# Patient Record
Sex: Female | Born: 1940 | Race: Black or African American | Hispanic: No | Marital: Single | Smoking: Former smoker
Health system: Southern US, Community
[De-identification: ages and names within clinical notes are randomized; demographics above are authoritative.]

## PROBLEM LIST (undated history)

## (undated) DIAGNOSIS — H9190 Unspecified hearing loss, unspecified ear: Secondary | ICD-10-CM

## (undated) DIAGNOSIS — J8289 Other pulmonary eosinophilia, not elsewhere classified: Secondary | ICD-10-CM

## (undated) DIAGNOSIS — M549 Dorsalgia, unspecified: Secondary | ICD-10-CM

## (undated) DIAGNOSIS — I10 Essential (primary) hypertension: Secondary | ICD-10-CM

## (undated) DIAGNOSIS — E249 Cushing's syndrome, unspecified: Secondary | ICD-10-CM

## (undated) DIAGNOSIS — J82 Pulmonary eosinophilia, not elsewhere classified: Secondary | ICD-10-CM

## (undated) DIAGNOSIS — J449 Chronic obstructive pulmonary disease, unspecified: Secondary | ICD-10-CM

## (undated) DIAGNOSIS — K219 Gastro-esophageal reflux disease without esophagitis: Secondary | ICD-10-CM

## (undated) DIAGNOSIS — M1990 Unspecified osteoarthritis, unspecified site: Secondary | ICD-10-CM

## (undated) DIAGNOSIS — N183 Chronic kidney disease, stage 3 unspecified: Secondary | ICD-10-CM

## (undated) DIAGNOSIS — D369 Benign neoplasm, unspecified site: Secondary | ICD-10-CM

## (undated) DIAGNOSIS — F419 Anxiety disorder, unspecified: Secondary | ICD-10-CM

## (undated) DIAGNOSIS — K5792 Diverticulitis of intestine, part unspecified, without perforation or abscess without bleeding: Secondary | ICD-10-CM

## (undated) DIAGNOSIS — Z974 Presence of external hearing-aid: Secondary | ICD-10-CM

## (undated) DIAGNOSIS — D509 Iron deficiency anemia, unspecified: Secondary | ICD-10-CM

## (undated) DIAGNOSIS — M4800 Spinal stenosis, site unspecified: Secondary | ICD-10-CM

## (undated) DIAGNOSIS — S22000A Wedge compression fracture of unspecified thoracic vertebra, initial encounter for closed fracture: Secondary | ICD-10-CM

## (undated) DIAGNOSIS — Z972 Presence of dental prosthetic device (complete) (partial): Secondary | ICD-10-CM

## (undated) DIAGNOSIS — M539 Dorsopathy, unspecified: Secondary | ICD-10-CM

## (undated) DIAGNOSIS — G8929 Other chronic pain: Secondary | ICD-10-CM

## (undated) DIAGNOSIS — M1380 Other specified arthritis, unspecified site: Secondary | ICD-10-CM

## (undated) HISTORY — DX: Other specified arthritis, unspecified site: M13.80

## (undated) HISTORY — DX: Chronic kidney disease, stage 3 unspecified: N18.30

## (undated) HISTORY — PX: ABDOMINAL HYSTERECTOMY: SHX81

## (undated) HISTORY — DX: Iron deficiency anemia, unspecified: D50.9

## (undated) HISTORY — DX: Cushing's syndrome, unspecified: E24.9

## (undated) HISTORY — DX: Unspecified osteoarthritis, unspecified site: M19.90

## (undated) HISTORY — DX: Other pulmonary eosinophilia, not elsewhere classified: J82.89

## (undated) HISTORY — PX: TUBAL LIGATION: SHX77

## (undated) HISTORY — PX: EYE SURGERY: SHX253

## (undated) HISTORY — DX: Chronic kidney disease, stage 3 (moderate): N18.3

## (undated) HISTORY — PX: BACK SURGERY: SHX140

## (undated) HISTORY — DX: Essential (primary) hypertension: I10

## (undated) HISTORY — DX: Spinal stenosis, site unspecified: M48.00

## (undated) HISTORY — PX: KNEE ARTHROSCOPY: SUR90

## (undated) HISTORY — DX: Pulmonary eosinophilia, not elsewhere classified: J82

## (undated) HISTORY — DX: Benign neoplasm, unspecified site: D36.9

## (undated) HISTORY — PX: JOINT REPLACEMENT: SHX530

---

## 1989-03-15 DIAGNOSIS — J45909 Unspecified asthma, uncomplicated: Secondary | ICD-10-CM

## 1989-03-15 HISTORY — DX: Unspecified asthma, uncomplicated: J45.909

## 2001-07-26 ENCOUNTER — Encounter: Payer: Self-pay | Admitting: Emergency Medicine

## 2001-07-26 ENCOUNTER — Emergency Department (HOSPITAL_COMMUNITY): Admission: EM | Admit: 2001-07-26 | Discharge: 2001-07-26 | Payer: Self-pay | Admitting: Emergency Medicine

## 2001-10-26 ENCOUNTER — Ambulatory Visit (HOSPITAL_COMMUNITY): Admission: RE | Admit: 2001-10-26 | Discharge: 2001-10-26 | Payer: Self-pay | Admitting: Unknown Physician Specialty

## 2001-10-26 ENCOUNTER — Encounter: Payer: Self-pay | Admitting: Unknown Physician Specialty

## 2002-10-30 ENCOUNTER — Ambulatory Visit (HOSPITAL_COMMUNITY): Admission: RE | Admit: 2002-10-30 | Discharge: 2002-10-30 | Payer: Self-pay | Admitting: Pulmonary Disease

## 2003-02-01 ENCOUNTER — Ambulatory Visit (HOSPITAL_COMMUNITY): Admission: RE | Admit: 2003-02-01 | Discharge: 2003-02-01 | Payer: Self-pay | Admitting: Family Medicine

## 2003-02-25 ENCOUNTER — Ambulatory Visit (HOSPITAL_COMMUNITY): Admission: RE | Admit: 2003-02-25 | Discharge: 2003-02-25 | Payer: Self-pay | Admitting: *Deleted

## 2003-05-26 ENCOUNTER — Emergency Department (HOSPITAL_COMMUNITY): Admission: EM | Admit: 2003-05-26 | Discharge: 2003-05-26 | Payer: Self-pay | Admitting: Emergency Medicine

## 2003-06-13 ENCOUNTER — Ambulatory Visit (HOSPITAL_COMMUNITY): Admission: RE | Admit: 2003-06-13 | Discharge: 2003-06-13 | Payer: Self-pay | Admitting: Family Medicine

## 2003-07-02 ENCOUNTER — Emergency Department (HOSPITAL_COMMUNITY): Admission: EM | Admit: 2003-07-02 | Discharge: 2003-07-02 | Payer: Self-pay | Admitting: Emergency Medicine

## 2003-07-02 ENCOUNTER — Inpatient Hospital Stay (HOSPITAL_COMMUNITY): Admission: EM | Admit: 2003-07-02 | Discharge: 2003-07-05 | Payer: Self-pay | Admitting: Emergency Medicine

## 2003-07-31 ENCOUNTER — Ambulatory Visit (HOSPITAL_COMMUNITY): Admission: RE | Admit: 2003-07-31 | Discharge: 2003-07-31 | Payer: Self-pay | Admitting: Family Medicine

## 2003-09-02 ENCOUNTER — Ambulatory Visit (HOSPITAL_COMMUNITY): Admission: RE | Admit: 2003-09-02 | Discharge: 2003-09-02 | Payer: Self-pay | Admitting: Internal Medicine

## 2003-09-24 ENCOUNTER — Emergency Department (HOSPITAL_COMMUNITY): Admission: EM | Admit: 2003-09-24 | Discharge: 2003-09-25 | Payer: Self-pay | Admitting: Emergency Medicine

## 2003-10-31 ENCOUNTER — Ambulatory Visit (HOSPITAL_COMMUNITY): Admission: RE | Admit: 2003-10-31 | Discharge: 2003-10-31 | Payer: Self-pay | Admitting: Unknown Physician Specialty

## 2004-11-02 ENCOUNTER — Ambulatory Visit (HOSPITAL_COMMUNITY): Admission: RE | Admit: 2004-11-02 | Discharge: 2004-11-02 | Payer: Self-pay | Admitting: Unknown Physician Specialty

## 2005-01-01 ENCOUNTER — Ambulatory Visit (HOSPITAL_COMMUNITY): Admission: RE | Admit: 2005-01-01 | Discharge: 2005-01-01 | Payer: Self-pay | Admitting: Family Medicine

## 2005-01-18 ENCOUNTER — Encounter (HOSPITAL_COMMUNITY): Admission: RE | Admit: 2005-01-18 | Discharge: 2005-02-17 | Payer: Self-pay | Admitting: Oncology

## 2005-01-18 ENCOUNTER — Ambulatory Visit (HOSPITAL_COMMUNITY): Payer: Self-pay | Admitting: Oncology

## 2005-01-18 ENCOUNTER — Encounter: Admission: RE | Admit: 2005-01-18 | Discharge: 2005-01-18 | Payer: Self-pay | Admitting: Oncology

## 2005-08-02 ENCOUNTER — Ambulatory Visit (HOSPITAL_COMMUNITY): Payer: Self-pay | Admitting: Oncology

## 2005-08-02 ENCOUNTER — Encounter: Admission: RE | Admit: 2005-08-02 | Discharge: 2005-08-02 | Payer: Self-pay | Admitting: Oncology

## 2005-08-02 ENCOUNTER — Encounter (HOSPITAL_COMMUNITY): Admission: RE | Admit: 2005-08-02 | Discharge: 2005-09-01 | Payer: Self-pay | Admitting: Oncology

## 2005-12-03 ENCOUNTER — Ambulatory Visit (HOSPITAL_COMMUNITY): Admission: RE | Admit: 2005-12-03 | Discharge: 2005-12-03 | Payer: Self-pay | Admitting: Family Medicine

## 2005-12-09 ENCOUNTER — Ambulatory Visit: Payer: Self-pay | Admitting: Orthopedic Surgery

## 2006-01-28 ENCOUNTER — Encounter (HOSPITAL_COMMUNITY): Admission: RE | Admit: 2006-01-28 | Discharge: 2006-02-27 | Payer: Self-pay | Admitting: Oncology

## 2006-01-28 ENCOUNTER — Encounter: Admission: RE | Admit: 2006-01-28 | Discharge: 2006-01-28 | Payer: Self-pay | Admitting: Oncology

## 2006-01-28 ENCOUNTER — Ambulatory Visit (HOSPITAL_COMMUNITY): Payer: Self-pay | Admitting: Oncology

## 2006-03-21 ENCOUNTER — Encounter (HOSPITAL_COMMUNITY): Admission: RE | Admit: 2006-03-21 | Discharge: 2006-04-20 | Payer: Self-pay | Admitting: Preventative Medicine

## 2006-08-01 ENCOUNTER — Encounter (HOSPITAL_COMMUNITY): Admission: RE | Admit: 2006-08-01 | Discharge: 2006-08-31 | Payer: Self-pay | Admitting: Oncology

## 2006-08-01 ENCOUNTER — Ambulatory Visit (HOSPITAL_COMMUNITY): Payer: Self-pay | Admitting: Oncology

## 2006-12-06 ENCOUNTER — Ambulatory Visit (HOSPITAL_COMMUNITY): Admission: RE | Admit: 2006-12-06 | Discharge: 2006-12-06 | Payer: Self-pay | Admitting: Family Medicine

## 2007-01-26 ENCOUNTER — Encounter (HOSPITAL_COMMUNITY): Admission: RE | Admit: 2007-01-26 | Discharge: 2007-02-25 | Payer: Self-pay | Admitting: Oncology

## 2007-01-26 ENCOUNTER — Ambulatory Visit (HOSPITAL_COMMUNITY): Payer: Self-pay | Admitting: Oncology

## 2007-02-01 ENCOUNTER — Ambulatory Visit (HOSPITAL_COMMUNITY): Admission: RE | Admit: 2007-02-01 | Discharge: 2007-02-01 | Payer: Self-pay | Admitting: Family Medicine

## 2007-09-26 ENCOUNTER — Ambulatory Visit (HOSPITAL_COMMUNITY): Admission: RE | Admit: 2007-09-26 | Discharge: 2007-09-26 | Payer: Self-pay | Admitting: Family Medicine

## 2007-09-27 ENCOUNTER — Ambulatory Visit (HOSPITAL_COMMUNITY): Admission: RE | Admit: 2007-09-27 | Discharge: 2007-09-27 | Payer: Self-pay | Admitting: Family Medicine

## 2007-10-30 ENCOUNTER — Ambulatory Visit (HOSPITAL_COMMUNITY): Admission: RE | Admit: 2007-10-30 | Discharge: 2007-10-30 | Payer: Self-pay | Admitting: Family Medicine

## 2007-11-03 ENCOUNTER — Ambulatory Visit (HOSPITAL_COMMUNITY): Admission: RE | Admit: 2007-11-03 | Discharge: 2007-11-03 | Payer: Self-pay | Admitting: Family Medicine

## 2007-11-28 ENCOUNTER — Emergency Department (HOSPITAL_COMMUNITY): Admission: EM | Admit: 2007-11-28 | Discharge: 2007-11-28 | Payer: Self-pay | Admitting: Emergency Medicine

## 2007-12-14 HISTORY — PX: ESOPHAGOGASTRODUODENOSCOPY: SHX1529

## 2007-12-14 HISTORY — PX: COLONOSCOPY: SHX174

## 2007-12-18 ENCOUNTER — Ambulatory Visit: Payer: Self-pay | Admitting: Internal Medicine

## 2008-01-08 ENCOUNTER — Ambulatory Visit (HOSPITAL_COMMUNITY): Admission: RE | Admit: 2008-01-08 | Discharge: 2008-01-08 | Payer: Self-pay | Admitting: Internal Medicine

## 2008-01-08 ENCOUNTER — Encounter: Payer: Self-pay | Admitting: Internal Medicine

## 2008-01-08 ENCOUNTER — Ambulatory Visit: Payer: Self-pay | Admitting: Internal Medicine

## 2008-01-19 ENCOUNTER — Ambulatory Visit (HOSPITAL_COMMUNITY): Admission: RE | Admit: 2008-01-19 | Discharge: 2008-01-19 | Payer: Self-pay | Admitting: Internal Medicine

## 2008-03-28 ENCOUNTER — Ambulatory Visit (HOSPITAL_COMMUNITY): Admission: RE | Admit: 2008-03-28 | Discharge: 2008-03-28 | Payer: Self-pay | Admitting: Family Medicine

## 2008-05-31 ENCOUNTER — Ambulatory Visit: Payer: Self-pay | Admitting: Internal Medicine

## 2008-05-31 DIAGNOSIS — K633 Ulcer of intestine: Secondary | ICD-10-CM

## 2008-06-03 ENCOUNTER — Encounter: Payer: Self-pay | Admitting: Internal Medicine

## 2008-06-19 ENCOUNTER — Ambulatory Visit: Payer: Self-pay | Admitting: Internal Medicine

## 2008-06-19 ENCOUNTER — Ambulatory Visit (HOSPITAL_COMMUNITY): Admission: RE | Admit: 2008-06-19 | Discharge: 2008-06-19 | Payer: Self-pay | Admitting: Internal Medicine

## 2008-06-19 HISTORY — PX: COLONOSCOPY: SHX174

## 2008-09-05 ENCOUNTER — Emergency Department (HOSPITAL_COMMUNITY): Admission: EM | Admit: 2008-09-05 | Discharge: 2008-09-05 | Payer: Self-pay | Admitting: Emergency Medicine

## 2008-09-30 ENCOUNTER — Ambulatory Visit (HOSPITAL_COMMUNITY): Admission: RE | Admit: 2008-09-30 | Discharge: 2008-09-30 | Payer: Self-pay | Admitting: Family Medicine

## 2009-01-15 ENCOUNTER — Ambulatory Visit (HOSPITAL_COMMUNITY): Admission: RE | Admit: 2009-01-15 | Discharge: 2009-01-15 | Payer: Self-pay | Admitting: Orthopedic Surgery

## 2009-03-20 ENCOUNTER — Ambulatory Visit (HOSPITAL_BASED_OUTPATIENT_CLINIC_OR_DEPARTMENT_OTHER): Admission: RE | Admit: 2009-03-20 | Discharge: 2009-03-20 | Payer: Self-pay | Admitting: Orthopedic Surgery

## 2009-04-07 ENCOUNTER — Ambulatory Visit (HOSPITAL_COMMUNITY): Admission: RE | Admit: 2009-04-07 | Discharge: 2009-04-07 | Payer: Self-pay | Admitting: Family Medicine

## 2009-04-21 ENCOUNTER — Ambulatory Visit (HOSPITAL_COMMUNITY): Admission: RE | Admit: 2009-04-21 | Discharge: 2009-04-21 | Payer: Self-pay | Admitting: Family Medicine

## 2009-10-09 ENCOUNTER — Ambulatory Visit (HOSPITAL_COMMUNITY): Admission: RE | Admit: 2009-10-09 | Discharge: 2009-10-09 | Payer: Self-pay | Admitting: Family Medicine

## 2010-04-05 ENCOUNTER — Encounter: Payer: Self-pay | Admitting: Family Medicine

## 2010-04-06 ENCOUNTER — Encounter: Payer: Self-pay | Admitting: Family Medicine

## 2010-05-31 LAB — POCT I-STAT 4, (NA,K, GLUC, HGB,HCT)
Glucose, Bld: 85 mg/dL (ref 70–99)
HCT: 40 % (ref 36.0–46.0)
Hemoglobin: 13.6 g/dL (ref 12.0–15.0)

## 2010-07-20 ENCOUNTER — Other Ambulatory Visit (HOSPITAL_COMMUNITY): Payer: Self-pay | Admitting: Family Medicine

## 2010-07-20 ENCOUNTER — Encounter (HOSPITAL_COMMUNITY): Payer: Self-pay

## 2010-07-20 ENCOUNTER — Ambulatory Visit (HOSPITAL_COMMUNITY)
Admission: RE | Admit: 2010-07-20 | Discharge: 2010-07-20 | Disposition: A | Discharge status: Home / Self Care | Payer: Medicare Other | Source: Ambulatory Visit | Attending: Family Medicine | Admitting: Family Medicine

## 2010-07-20 DIAGNOSIS — J441 Chronic obstructive pulmonary disease with (acute) exacerbation: Secondary | ICD-10-CM

## 2010-07-20 DIAGNOSIS — R0602 Shortness of breath: Secondary | ICD-10-CM | POA: Insufficient documentation

## 2010-07-20 DIAGNOSIS — J42 Unspecified chronic bronchitis: Secondary | ICD-10-CM

## 2010-07-20 HISTORY — DX: Chronic obstructive pulmonary disease, unspecified: J44.9

## 2010-07-28 NOTE — Consult Note (Signed)
NAME:  Glenda Terry, Glenda Terry           ACCOUNT NO.:  [id number]   MEDICAL RECORD NO.:  [id number]          PATIENT TYPE:  AMB   LOCATION:  DAY                           FACILITY:  [facility]   PHYSICIAN:  R. Roetta Sessions, M.D. DATE OF BIRTH:  Jul 21, 1940   DATE OF CONSULTATION:  DATE OF DISCHARGE:                                 CONSULTATION   REQUESTING PHYSICIAN:  Patrica Duel, MD   HEMATOLOGIST:  Ladona Horns. Neijstrom, MD   REASON FOR CONSULTATION:  Upper abdominal pain and dysphagia.   HISTORY OF PRESENT ILLNESS:  Glenda Terry is a 70 year old African  American female.  She developed upper abdominal pain around 3 weeks ago.  She presented to [facility] Emergency Room.  She had a CT of  abdomen and pelvis which was benign.  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  She was found to have positive H. pylori serology.  She was  started on a Prevpac which she has 3 more doses.  She has also been  started on nystatin and fluconazole for oral and vaginal Candida.  She  continues to have right upper quadrant abdominal pain and upper  abdominal pain.  She feels as though her food is getting stuck going  down and points to her epigastrium.  She is having problems with  dysphagia only with solid foods.  She denies any problems with liquids.  She is having frequent water brash.  It does awaken her at night from  sleeping.  She has not recently been on PPI.  She denies any NSAID use.  She occasionally has regurgitation.  For the last 2 months, she has had  problems with heartburn and indigestion, nausea.  She has vomited  multiple times.  She has been taking some Phenergan which does seem to  help some.  She denies any diarrhea or constipation.  Denies any rectal  bleeding or melena.   She does have history of chronic anemia, has been seen by Dr. Mariel Sleet  previously.  On ER record, she is found to have a hemoglobin of 11.5,  hematocrit 35.5, MCV 79.5, normal platelet and white blood cell  count, a  creatinine of 1.47 with a history of chronic renal insufficiency, a  sodium of 134, a glucose of 103 and otherwise normal CMP.  She had a  normal amylase and lipase outpatient.   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 Jerre Simon and had a  recent negative urinalysis.  She has anemia of chronic disease followed  by Dr. Mariel Sleet.  She had a complete hysterectomy in 1985.  She had a  high-risk screening colonoscopy on September 02, 2003 which was normal.   CURRENT MEDICATIONS:  1. Spiriva once daily.  2. Provera 2 puffs q.6 h.  3. Nystatin 5 mL q.i.d.  4. Prevpac 3 doses left.  5. Promethazine 25 mg q.i.d.  6. Fluconazole 100 mg daily.   ALLERGIES:  FLEXERIL and  TREE NUT.   FAMILY HISTORY:  Positive for  multiple family members with colon cancer.  She has 2 sisters who have passed away with colon cancer one at 38, the  other at 1.  She has also had a maternal grandfather, maternal aunt  immature and several maternal uncles with colon cancer.  Her mother  passed away from liver disease at 83.  Father at 31 from prostate  carcinoma and renal failure.  She has 5 living sisters with history of  polyps and 6 brothers.   SOCIAL HISTORY:  Glenda Terry is single.  She has 3 grown healthy  children.  She is employed part-time with [employer].  She has a  43-year pack history of tobacco use.  Denies any alcohol or drug use.   REVIEW OF SYSTEMS:  See HPI otherwise negative.   PHYSICAL EXAMINATION:  VITAL SIGNS:  Weight 199 pounds, height 60  inches, temperature 98 degrees, blood pressure 128/80 and pulse 56.  GENERAL:  Glenda Terry is a pleasant, alert, oriented, and cooperative  [place] female, in no acute distress.  HEENT.  Sclerae clear, nonicteric, conjunctivae pink.  Oropharynx pink  and moist without any  lesions.  She does have upper dentures intact and  partial on lower.  NECK:  Supple without any mass or thyromegaly.  HEART:  Regular rate and rhythm.  Normal S1 and S2.  No murmurs, clicks,  rubs or gallops.  LUNGS:  Clear to auscultation bilaterally.  ABDOMEN:  Positive bowel sounds x4.  No bruits auscultated.  Soft and  nondistended.  She does have mild tenderness to the right upper quadrant  and epigastrium on deep palpation.  There is no rebound, tenderness, or  guarding.  No hepatosplenomegaly or mass.  No CVAT.  EXTREMITIES:  Without clubbing or edema bilaterally.  SKIN:  Pink, warm and dry without any rash or jaundice.   IMPRESSION:  Glenda Terry is a 70 year old African American female  with approximate 78-month history of upper abdominal pain that is in the  right upper quadrant and epigastrium along with nausea and vomiting as  well as water brash, heartburn and indigestion.  I suspect much of her  symptoms could be due to H. pylori gastritis and/or peptic ulcer  disease.  She is going to require further evaluation.  Biliary etiology  should remain in the differential, although there was no evidence of  cholelithiasis on CT.  She does have a stable hepatic hemangioma.  She  also has anemia of chronic disease, suspect did due to her renal  insufficiency and a significant family history of colon carcinoma.   PLAN:  1. Check anemia panel.  2. EGD and we would suggest colonoscopy if she shows evidence of iron      deficiency and even given the fact that her strong family history.      She would be due for high-risk screening colonoscopy this coming      year anyway.  I have discussed both procedures including risks and      benefits, which include but are not limited to bleeding, infection,      perforation, or drug reaction.  She agrees with this plan and      consent will be obtained.  3. Omeprazole 20 mg daily to begin when she completes Prevpac #31 with      3 refills.   4. Yogurt with each meal.  5. She is to complete Prevpac.   Thank you Dr. Nobie Putnam for allow [place] to participate in the care of Ms.  Terry.      Lorenza Burton, N.P.      Jonathon Bellows, M.D.  Electronically Signed    KJ/MEDQ  D:  12/18/2007  T:  12/19/2007  Job:  [id number]   cc:   Patrica Duel, M.D.  Fax: [phone]

## 2010-07-28 NOTE — Op Note (Signed)
NAME:  Glenda Terry, Glenda Terry           ACCOUNT NO.:  [id number]   MEDICAL RECORD NO.:  [id number]          PATIENT TYPE:  AMB   LOCATION:  DAY                           FACILITY:  [facility]   PHYSICIAN:  R. Roetta Sessions, M.D. DATE OF BIRTH:  07-07-1940   DATE OF PROCEDURE:  06/19/2008  DATE OF DISCHARGE:                               OPERATIVE REPORT   PROCEDURE:  Diagnostic colonoscopy.   INDICATIONS FOR PROCEDURE:  A 70 year old lady with a prior history of  NSAID use, positive family history of colon cancer with  multiple first-  degree relatives.  Found to have hepatic flexure ulcerations on  colonoscopy last fall, biopsies negative for malignancy.  She is  refrained from taking any nonsteroidals since that time at my request.  She is devoid of any lower GI tract symptoms.  She now comes for a  follow-up colonoscopy to reassess these lesions.  This approach has been  discussed with the patient previously in the office and again today at  the bedside.  The risks, benefits, alternatives and limitations have  been discussed.  Please see the documentation in the medical record.   PROCEDURE NOTE:  The O2 saturation, blood pressure, pulse and  respirations were monitored throughout the entire procedure.  Conscious  sedation was with Versed 5 mg IV and Demerol 100 mg IV in divided doses.  Instrument used was Pentax video chip system.   The digital rectal exam revealed no abnormalities.  The prep was  adequate.   COLON:  Colonic mucosa was surveyed from the rectosigmoid junction  through the left transverse right colon, to the appendiceal orifice,  ileocecal valve and cecum.  These structures were well seen and  photographed for the record.  From this level the scope was slowly and  cautiously withdrawn.  All previously mentioned mucosal surfaces were  again seen.  The colon was somewhat tortuous and elongated.  There was  some scattered left-sided diverticula.  Careful inspection of the  colonic mucosa, particularly near the hepatic flexure, failed to  demonstrate any mucosal abnormality otherwise.  The scope was pulled  down into the rectum.  The rectal mucosa including retroflexed view of  the anal verge demonstrated no abnormalities.  The patient tolerated the  procedure well.  Cecal withdrawal time 7 minutes.   IMPRESSION:  1. Normal rectum.  2. Somewhat tortuous and elongated colon with scattered left-sided      diverticula.  The remainder of the colonic mucosa appeared entirely      normal.  3. Apparent healing of the previously noted colonic ulcers (I suspect      these lesions were NSAID-related).   RECOMMENDATIONS:  1. Will avoid NSAID agents in the future.  2. Given family history of colon cancer, recommend repeat high-risk      screening colonoscopy in 5 years.      Jonathon Bellows, M.D.  Electronically Signed     RMR/MEDQ  D:  06/19/2008  T:  06/19/2008  Job:  [id number]   cc:   Ladona Horns. Mariel Sleet, MD  Fax: [phone]   Patrica Duel, M.D.  Fax: [phone]

## 2010-07-28 NOTE — Op Note (Signed)
NAME:  Glenda Terry, Glenda Terry           ACCOUNT NO.:  [id number]   MEDICAL RECORD NO.:  [id number]          PATIENT TYPE:  AMB   LOCATION:  DAY                           FACILITY:  [facility]   PHYSICIAN:  R. Roetta Sessions, M.D. DATE OF BIRTH:  15-Jan-1941   DATE OF PROCEDURE:  01/08/2008  DATE OF DISCHARGE:                               OPERATIVE REPORT   INDICATIONS FOR PROCEDURE:  A 70 year old lady with 76-month history of  vague upper abdominal pain, right upper quadrant abdominal pain, nausea  and vomiting, history of gastroesophageal reflux disease recently off  acid suppression therapy.  H. pylori serologies were positive, and she  just finished a course of Prevpac.  We saw her in the office, started  her back on omeprazole 20 mg orally daily on December 18, 2007, and  already she has had marked improvement in her reflux symptoms and upper  abdominal discomfort.  She also has an anemia and marked positive family  history of colon cancer.  She had a normal colonoscopy in June 2005.  She also has element of esophageal dysphagia to solids.  EGD with  possible esophageal dilation followed by colonoscopy now being done.  Risks, benefits, and alternatives have been reviewed, questions  answered.  She is agreeable.  Please see the documentation in the  medical record.   PROCEDURE NOTE:  O2 saturation monitored throughout the entire  procedure.   CONSCIOUS SEDATION:  Versed 60 mg IV, Demerol 100 mg IV in divided  doses.   INSTRUMENTATION:  Pentax video chip system.   FINDINGS:  EGD examination, tubular esophagus revealed a noncritical  Schatzki ring.  Esophageal mucosa otherwise appeared unremarkable.  Tubular esophagus patent through the EG junction.  Scope was easily  advanced across the EG junction.  Stomach:  Gastric cavity was emptied  and insufflated well with air.  Thorough examination of the gastric  mucosa including retroflexed view of the proximal stomach  esophagogastric junction  demonstrated only a small hiatal hernia.  Pylorus patent, easily traversed.  Examination of the bulb and second  portion was slightly pale.  Duodenal mucosa otherwise appeared entirely  normal.   THERAPEUTIC/DIAGNOSTIC MANEUVERS PERFORMED:  A 56-French Maloney  dilators passed full insertion with ease.  A look back revealed a slight  rent through the EVS mucosa.  Otherwise, no apparent complication  related to passage of the dilator.  Subsequent biopsies of D2, D3 were  taken for histologic study.  The patient tolerated the procedure well  and was prepared for colonoscopy.  Digital rectal exam revealed no  abnormalities.  Endoscopic Findings:  Prep was adequate.  Colon:  Colonic mucosa was surveyed from the rectosigmoid junction through the  left transverse, right colon, the appendiceal orifice, ileocecal valve,  and cecum.  These structures were well seen and photographed for the  record.  From this level, scope was slowly withdrawn, and all previous  mentioned mucosal surfaces were again seen at the hepatic flexure.  There were 2 areas of ulcerated mucosa, they were both on folds.  Surrounding mucosa was erythematous and somewhat irregular and a little  hard to biopsy forceps  palpation, one area that was approximately 1-2 cm  in dimensions, the other was approximately 1 x 1 cm.  These were both on  folds.  The intervening mucosa appeared entirely normal.  These areas  did not appear to be obviously neoplastic, although they are certainly  suspicious and appeared to be more inflammatory appearing; however,  multiple biopsies of these areas were taken for histologic study with  jumbo biopsy forceps.  The patient had some scattered sigmoid  diverticula.  Remaining of the colonic mucosa appeared entirely normal.  Scope was pulled down the rectum.  Thorough examination of the rectal  mucosa including retroflexed view of anal verge demonstrated no  abnormalities.  The patient tolerated both  procedures and was reacted in  endoscopy.   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  Colonoscopy  findings normal rectum, scattered diffuse sigmoid diverticula, 2 areas  of ulceration at hepatic flexure without out and out changes of  carcinoma.  Areas certainly suspicious and biopsied.  These were  relatively small areas.   Otherwise, colonic mucosa appeared unremarkable.   Reviewed the colonoscopy back in June 2005 and there were no colonic  mucosal abnormalities observed at that time.   RECOMMENDATIONS:  Follow up on path.  If there is carcinoma here, she  will need to see the surgeon.  If not, she may need to see this surgeon  anyway, but will make further recommendations in the very near future.      Jonathon Bellows, M.D.  Electronically Signed     RMR/MEDQ  D:  01/08/2008  T:  01/08/2008  Job:  [id number]   cc:   Ladona Horns. Mariel Sleet, MD  Fax: [phone]   Patrica Duel, M.D.  Fax: [phone]

## 2010-07-31 NOTE — Procedures (Signed)
NAME:  LAKEIA, BRADSHAW NO.:  [id number]   MEDICAL RECORD NO.:  [id number]                   PATIENT TYPE:  OUT   LOCATION:  RAD                                  FACILITY:  [facility]   PHYSICIAN:  Vida Roller, M.D.                DATE OF BIRTH:  October 03, 1940   DATE OF PROCEDURE:  02/25/2003  DATE OF DISCHARGE:                                    STRESS TEST   EXERCISE CARDIOLITE:   INDICATION:  [name] is a 70 year old female with history of reactive  airways disease with no known coronary artery disease who presents with  atypical chest discomfort.  Her cardiac risk factors include history of  tobacco abuse and hypertension.   BASELINE DATA:  EKG reveals sinus rhythm at 87 beats per minute with  nonspecific ST abnormalities and poor R wave progression, blood pressure is  128/80.   [name] exercised for a total of 5 minutes 54 seconds to Bruce  protocol stage II, maximal heart rate achieved was 170 beats per minute  which is 106% of predicted maximum, maximum blood pressure was 192/82.  Patient had chest tightness, shortness of breath, and wheezing at the end of  exercise which resolved in recovery.  Her wheezing was audible.  She had no  arrhythmias and no ischemic changes on EKG.  Exercise was stopped secondary  to fatigue and dyspnea.   FINAL IMAGES AND RESULTS:  Pending MD review.     ________________________________________  ___________________________________________  Jae Dire, P.A. [facility]                      Vida Roller, M.D.   AB/MEDQ  D:  02/25/2003  T:  02/25/2003  Job:  [id number]

## 2010-07-31 NOTE — Procedures (Signed)
NAME:  Glenda Terry, Glenda Terry           ACCOUNT NO.:  [id number]   MEDICAL RECORD NO.:  [id number]          PATIENT TYPE:  OUT   LOCATION:  RESP                          FACILITY:  [facility]   PHYSICIAN:  Edward L. Juanetta Gosling, M.D.DATE OF BIRTH:  January 22, 1941   DATE OF PROCEDURE:  01/01/2005  DATE OF DISCHARGE:  01/01/2005                              PULMONARY FUNCTION TEST   1.  Spirometry shows a moderate ventilatory defect with evidence of airflow      obstruction.  2.  Lung volumes show no change in total lung capacity so no evidence of      restrictive change.  3.  DLCO is mildly reduced.  4.  Arterial blood gases are normal.  5.  There is significant improvement with inhaled bronchodilator. This is      consistent with the clinical diagnosis listed as asthma, also with      reversible COPD.      Edward L. Juanetta Gosling, M.D.  Electronically Signed     ELH/MEDQ  D:  01/02/2005  T:  01/04/2005  Job:  [id number]   cc:   Kirk Ruths, M.D.  Fax: [phone]

## 2010-07-31 NOTE — Discharge Summary (Signed)
NAME:  Glenda Terry, Glenda Terry                     ACCOUNT NO.:  [id number]   MEDICAL RECORD NO.:  [id number]                   PATIENT TYPE:  INP   LOCATION:  A209                                 FACILITY:  [facility]   PHYSICIAN:  Kirk Ruths, M.D.            DATE OF BIRTH:  September 29, 1940   DATE OF ADMISSION:  07/02/2003  DATE OF DISCHARGE:  07/05/2003                                 DISCHARGE SUMMARY   DISCHARGE DIAGNOSES:  1. Acute gastroenteritis.  2. History of hypertension.  3. History of bronchitis.  4. Iron-deficiency anemia.   HOSPITAL COURSE:  This is a 70 year old black female who was admitted  thorough the emergency room on her second visit of the day for severe  epigastric pain.  Patient had undergone CT scan of her abdomen and chest  which was negative for dissecting aneurysm or common duct stone or peptic  ulcer disease.  Patient's pain was somewhat relieved by Dilaudid IV.  She  had some nausea, vomiting, and also some diarrhea.  Patient was admitted to  the floor, given IV fluids as well as IV Protonix and IV Dilaudid and  Zofran.  Patient's pain was somewhat improved by the following day,  ultrasound of gallbladder was obtained which was negative, she was seen in  consultation with GI who by the time they saw her abdomen was significantly  improved, they felt she possibly had a foodborne gastroenteritis.  Patient  continued to improve with her nausea and pain, was hungry, diet was  advanced, patient is tolerating a regular diet without pain, nausea,  vomiting or diarrhea, vital signs are stable.  Patient's lab work on  admission showed white count of 7500, hemoglobin 10.7 (after hydration  hemoglobin was 10.1), MCV was 75; patient's electrolytes showed a slightly  low sodium of 133 on admission, BUN and creatinine were normal at 17 and  1.5, amylase and lipase were normal; serial cardiac enzymes were negative  although total CPK was slightly elevated.  Patient also  underwent cultures  of her stool which are negative.  She will be discharged home on iron  supplement as well as her blood pressure medication to be followed in the  office in approximately 4 weeks at which time we will recheck her CBC.     ___________________________________________                                         Kirk Ruths, M.D.   WMM/MEDQ  D:  07/05/2003  T:  07/05/2003  Job:  [id number]

## 2010-07-31 NOTE — H&P (Signed)
NAME:  Glenda Terry, Glenda Terry                     ACCOUNT NO.:  [id number]   MEDICAL RECORD NO.:  [id number]                  PATIENT TYPE:   LOCATION:                                       FACILITY:  [facility]   PHYSICIAN:  R. Roetta Sessions, M.D.              DATE OF BIRTH:  1940-09-01   DATE OF ADMISSION:  08/08/2003  DATE OF DISCHARGE:                                HISTORY & PHYSICAL   SUBJECTIVE:  The patient is being seen in followup from hospital  consultation done at [facility] on July 03, 2003.  The patient had  been admitted with severe abdominal pain accompanied by nausea and vomiting.  The patient's discharge diagnosis was acute gastroenteritis.  Since that  time, the patient has been seen by Dr. Nobie Putnam, who ordered a HIDA scan.  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  Patient states that she is  reluctant to have cholecystectomy at this time until she becomes more  symptomatic.  The patient is being seen today in followup and to be referred  for a colonoscopy.  Research of the records indicates that the patient's  last colonoscopy was done on February 12, 1999 for high-risk screening  because of her sister's metastatic colorectal cancer.  The impression of the  colonoscopy done that day showed internal hemorrhoids and otherwise normal  rectum and a normal colon.  The patient was given the recommendation of a  repeat colonoscopy every 5 years.   Since the discharge, the patient has had an episode of abdominal pain, which  was the reason for the HIDA scan.  Patient denies nausea, vomiting, and  diarrhea.  She denies hematochezia or melena.  She does state that her  stools appear to be changed in that they are very light and appear to float  in the commode.  The patient further reports having postprandial bowel  movements, which she states are new for her.  Patient denies chest pain  and  states that she only rarely has reflux, which she treats with a combination  of Protonix or over-the-counter antacids.  Patient denies hematemesis or  swallowing difficulties.   OBJECTIVE:  VITAL SIGNS:  Weight 196.  Blood pressure 120/80, pulse 86.  GENERAL:  Patient is a pleasant, well-developed and well-nourished 70-year-  old African-American female sitting comfortably in the examining room chair.  HEENT:  Normocephalic and atraumatic.  Sclerae are clear and nonicteric.  Conjunctivae are pink.  LUNGS:  Clear to auscultation bilaterally without wheezes, rales or rhonchi.  HEART:  Regular rate and rhythm without murmurs, rubs or gallops.  ABDOMEN:  There is mild epigastric tenderness to palpation.  Abdomen is soft  and distended without hepatosplenomegaly or masses.  Bowel sounds are  present in all four quadrants.  No hepatosplenomegaly or masses were  palpated.  EXTREMITIES:  No clubbing, cyanosis or edema.  SKIN:  Warm and dry without jaundice.   Imaging studies are as stated above for the HIDA scan.   ASSESSMENT:  A 70 year old African-American female being seen in followup  for hospital consult for abdominal pain.  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  Patient is high risk for colon cancer because of a family history of her  sister dying of metastatic colorectal cancer.  The patient is concerned and  is returning for a referral for five-year follow-up colonoscopy.  This will  be scheduled for Dr. Kendell Bane.  Patient discussed that she would like to have  an EGD, but there are no significant findings or indications for this  procedure at this time.  If the patient's occasional mild  reflux should  become problematic, this possibility could be explored.   RECOMMENDATIONS:  The patient will be scheduled for TCS with Dr. Kendell Bane.  The risks and benefits of this procedure were discussed with this patient,  and she did voice understanding.  Further recommendations will be made based  on the colonoscopy findings.  Thank you for allowing [place] to participate in  the care of this patient.     _____________________________________  ___________________________________________  Ashok Pall, PA                        Jonathon Bellows, M.D.   GC/MEDQ  D:  08/08/2003  T:  08/08/2003  Job:  [id number]   cc:   Patrica Duel, M.D.  [address], [address]  [place]  [place] [address]  Fax: [phone]

## 2010-09-21 ENCOUNTER — Other Ambulatory Visit (HOSPITAL_COMMUNITY): Payer: Self-pay | Admitting: Internal Medicine

## 2010-09-21 DIAGNOSIS — Z139 Encounter for screening, unspecified: Secondary | ICD-10-CM

## 2010-10-12 ENCOUNTER — Ambulatory Visit (HOSPITAL_COMMUNITY)
Admission: RE | Admit: 2010-10-12 | Discharge: 2010-10-12 | Disposition: A | Discharge status: Home / Self Care | Payer: Medicare Other | Source: Ambulatory Visit | Attending: Internal Medicine | Admitting: Internal Medicine

## 2010-10-12 DIAGNOSIS — Z139 Encounter for screening, unspecified: Secondary | ICD-10-CM

## 2010-10-12 DIAGNOSIS — Z1231 Encounter for screening mammogram for malignant neoplasm of breast: Secondary | ICD-10-CM | POA: Insufficient documentation

## 2010-11-24 ENCOUNTER — Other Ambulatory Visit (HOSPITAL_COMMUNITY): Payer: Self-pay | Admitting: Internal Medicine

## 2010-11-24 DIAGNOSIS — Z01419 Encounter for gynecological examination (general) (routine) without abnormal findings: Secondary | ICD-10-CM

## 2010-11-24 DIAGNOSIS — Z139 Encounter for screening, unspecified: Secondary | ICD-10-CM

## 2010-11-27 ENCOUNTER — Ambulatory Visit (HOSPITAL_COMMUNITY)
Admission: RE | Admit: 2010-11-27 | Discharge: 2010-11-27 | Disposition: A | Discharge status: Home / Self Care | Payer: Medicare Other | Source: Ambulatory Visit | Attending: Internal Medicine | Admitting: Internal Medicine

## 2010-11-27 DIAGNOSIS — Z01419 Encounter for gynecological examination (general) (routine) without abnormal findings: Secondary | ICD-10-CM

## 2010-11-27 DIAGNOSIS — M899 Disorder of bone, unspecified: Secondary | ICD-10-CM | POA: Insufficient documentation

## 2010-11-27 DIAGNOSIS — Z139 Encounter for screening, unspecified: Secondary | ICD-10-CM

## 2010-11-27 DIAGNOSIS — Z780 Asymptomatic menopausal state: Secondary | ICD-10-CM | POA: Insufficient documentation

## 2010-12-14 LAB — POCT CARDIAC MARKERS
CKMB, poc: 3.7
Myoglobin, poc: 137
Troponin i, poc: <0.05

## 2010-12-14 LAB — DIFFERENTIAL
Basophils Absolute: 0.1
Lymphocytes Relative: 39
Monocytes Absolute: 0.6
Neutro Abs: 4.7

## 2010-12-14 LAB — URINALYSIS, ROUTINE W REFLEX MICROSCOPIC
Nitrite: NEGATIVE
Protein, ur: NEGATIVE
Urobilinogen, UA: 0.2

## 2010-12-14 LAB — LIPASE, BLOOD: Lipase: 23

## 2010-12-14 LAB — COMPREHENSIVE METABOLIC PANEL
Albumin: 3.9
BUN: 19
Chloride: 98
Creatinine, Ser: 1.47 — ABNORMAL HIGH
GFR calc non Af Amer: 36 — ABNORMAL LOW
Total Bilirubin: 0.6

## 2010-12-14 LAB — URINE MICROSCOPIC-ADD ON

## 2010-12-14 LAB — CBC
HCT: 35.5 — ABNORMAL LOW
MCV: 79.5
Platelets: 252
WBC: 9.1

## 2010-12-15 LAB — CREATININE, SERUM: Creatinine, Ser: 1.36 — ABNORMAL HIGH

## 2010-12-22 LAB — DIFFERENTIAL
Basophils Absolute: 0.1
Basophils Relative: 1
Eosinophils Absolute: 0.2
Eosinophils Relative: 3
Monocytes Absolute: 0.7
Monocytes Relative: 9

## 2010-12-22 LAB — CBC
HCT: 37.6
Hemoglobin: 11.9 — ABNORMAL LOW
MCHC: 31.6
MCV: 78.6
RBC: 4.79
RDW: 16.3 — ABNORMAL HIGH

## 2011-02-02 ENCOUNTER — Other Ambulatory Visit: Payer: Self-pay | Admitting: Adult Health

## 2011-02-02 DIAGNOSIS — R52 Pain, unspecified: Secondary | ICD-10-CM

## 2011-02-02 DIAGNOSIS — N63 Unspecified lump in unspecified breast: Secondary | ICD-10-CM

## 2011-02-08 ENCOUNTER — Encounter (HOSPITAL_COMMUNITY): Payer: Self-pay

## 2011-02-08 ENCOUNTER — Emergency Department (HOSPITAL_COMMUNITY): Payer: Medicare Other

## 2011-02-08 ENCOUNTER — Encounter (HOSPITAL_COMMUNITY): Payer: Medicare Other

## 2011-02-08 ENCOUNTER — Inpatient Hospital Stay (HOSPITAL_COMMUNITY)
Admission: EM | Admit: 2011-02-08 | Discharge: 2011-02-09 | DRG: 203 | Disposition: A | Discharge status: Home / Self Care | Payer: Medicare Other | Attending: Internal Medicine | Admitting: Internal Medicine

## 2011-02-08 DIAGNOSIS — M129 Arthropathy, unspecified: Secondary | ICD-10-CM | POA: Diagnosis present

## 2011-02-08 DIAGNOSIS — M1990 Unspecified osteoarthritis, unspecified site: Secondary | ICD-10-CM | POA: Diagnosis present

## 2011-02-08 DIAGNOSIS — I10 Essential (primary) hypertension: Secondary | ICD-10-CM | POA: Diagnosis present

## 2011-02-08 DIAGNOSIS — F411 Generalized anxiety disorder: Secondary | ICD-10-CM | POA: Diagnosis present

## 2011-02-08 DIAGNOSIS — R0781 Pleurodynia: Secondary | ICD-10-CM | POA: Diagnosis present

## 2011-02-08 DIAGNOSIS — J449 Chronic obstructive pulmonary disease, unspecified: Secondary | ICD-10-CM

## 2011-02-08 DIAGNOSIS — K219 Gastro-esophageal reflux disease without esophagitis: Secondary | ICD-10-CM | POA: Diagnosis present

## 2011-02-08 DIAGNOSIS — F419 Anxiety disorder, unspecified: Secondary | ICD-10-CM | POA: Diagnosis present

## 2011-02-08 DIAGNOSIS — J209 Acute bronchitis, unspecified: Principal | ICD-10-CM | POA: Diagnosis present

## 2011-02-08 HISTORY — DX: Dorsalgia, unspecified: M54.9

## 2011-02-08 HISTORY — DX: Other chronic pain: G89.29

## 2011-02-08 HISTORY — DX: Anxiety disorder, unspecified: F41.9

## 2011-02-08 LAB — COMPREHENSIVE METABOLIC PANEL
ALT: 21 U/L (ref 0–35)
Albumin: 3.7 g/dL (ref 3.5–5.2)
BUN: 14 mg/dL (ref 6–23)
Calcium: 9.9 mg/dL (ref 8.4–10.5)
GFR calc Af Amer: 51 mL/min — ABNORMAL LOW (ref >90)
Glucose, Bld: 95 mg/dL (ref 70–99)
Potassium: 4.2 mEq/L (ref 3.5–5.1)
Sodium: 135 mEq/L (ref 135–145)
Total Protein: 7.5 g/dL (ref 6.0–8.3)

## 2011-02-08 LAB — CBC
MCH: 24.5 pg — ABNORMAL LOW (ref 26.0–34.0)
MCHC: 31.5 g/dL (ref 30.0–36.0)
MCV: 77.9 fL — ABNORMAL LOW (ref 78.0–100.0)
Platelets: 295 10*3/uL (ref 150–400)
RBC: 4.61 MIL/uL (ref 3.87–5.11)

## 2011-02-08 LAB — DIFFERENTIAL
Eosinophils Absolute: 0.2 10*3/uL (ref 0.0–0.7)
Eosinophils Relative: 2 % (ref 0–5)
Lymphs Abs: 5.5 10*3/uL — ABNORMAL HIGH (ref 0.7–4.0)
Monocytes Absolute: 0.7 10*3/uL (ref 0.1–1.0)
Neutrophils Relative %: 39 % — ABNORMAL LOW (ref 43–77)

## 2011-02-08 LAB — CARDIAC PANEL(CRET KIN+CKTOT+MB+TROPI)
CK, MB: 5.5 ng/mL — ABNORMAL HIGH (ref 0.3–4.0)
Relative Index: 1.4 (ref 0.0–2.5)
Troponin I: <0.3 ng/mL (ref <0.30)

## 2011-02-08 LAB — PRO B NATRIURETIC PEPTIDE: Pro B Natriuretic peptide (BNP): 42.3 pg/mL (ref 0–125)

## 2011-02-08 MED ORDER — IPRATROPIUM BROMIDE 0.02 % IN SOLN
0.5000 mg | Freq: Once | RESPIRATORY_TRACT | Status: AC
  Administered 2011-02-08: 0.5 mg via RESPIRATORY_TRACT
  Filled 2011-02-08: qty 2.5

## 2011-02-08 MED ORDER — IOHEXOL 350 MG/ML SOLN
100.0000 mL | Freq: Once | INTRAVENOUS | Status: AC | PRN
  Administered 2011-02-08: 100 mL via INTRAVENOUS

## 2011-02-08 MED ORDER — ALBUTEROL SULFATE (5 MG/ML) 0.5% IN NEBU
2.5000 mg | INHALATION_SOLUTION | Freq: Once | RESPIRATORY_TRACT | Status: AC
  Administered 2011-02-08: 2.5 mg via RESPIRATORY_TRACT
  Filled 2011-02-08: qty 0.5

## 2011-02-08 MED ORDER — ALBUTEROL (5 MG/ML) CONTINUOUS INHALATION SOLN
10.0000 mg/h | INHALATION_SOLUTION | Freq: Once | RESPIRATORY_TRACT | Status: AC
  Administered 2011-02-08: 10 mg/h via RESPIRATORY_TRACT

## 2011-02-08 MED ORDER — ALBUTEROL SULFATE (5 MG/ML) 0.5% IN NEBU
INHALATION_SOLUTION | RESPIRATORY_TRACT | Status: AC
  Filled 2011-02-08: qty 2

## 2011-02-08 MED ORDER — METHYLPREDNISOLONE SODIUM SUCC 125 MG IJ SOLR
125.0000 mg | Freq: Once | INTRAMUSCULAR | Status: AC
  Administered 2011-02-08: 125 mg via INTRAVENOUS
  Filled 2011-02-08: qty 2

## 2011-02-08 MED ORDER — ALPRAZOLAM 0.5 MG PO TABS
0.5000 mg | ORAL_TABLET | Freq: Once | ORAL | Status: AC
  Administered 2011-02-08: 0.5 mg via ORAL
  Filled 2011-02-08: qty 1

## 2011-02-08 NOTE — ED Notes (Signed)
Pt anxious. States she want to go home. Daughter wants her mother to have xanax. edp aware and back in with pt

## 2011-02-08 NOTE — ED Notes (Signed)
Pt reports being sob since sat, chest feels tight, denies any cp, +cough, denies any fever.

## 2011-02-08 NOTE — ED Notes (Signed)
Pt state she feels better and is ready to go home. Pt cont to have non prod cough. resp not as labored. Family at bedside

## 2011-02-08 NOTE — ED Provider Notes (Signed)
Scribed for Toy Baker, MD, the patient was seen in room [facility]10/[facility]10 . This chart was scribed by Ellie Lunch.   CSN: [id number] Arrival date & time: 02/08/2011  7:35 PM   First MD Initiated Contact with Patient 02/08/11 1942      Chief Complaint  Patient presents with  . Shortness of Breath    (Consider location/radiation/quality/duration/timing/severity/associated sxs/prior treatment) HPI Glenda Terry is a 70 y.o. female who presents to the Emergency Department complaining of SOB that has been constant and gradually worsening for the past 3-4 days. SOB is associated with a dry cough. SOB is worsened with exertion, but not supine position.  Pt reports that chest is tight, but has no actual chest pain.  Pt reports that her back hurts when coughing. Has treated SOB with a nebulizer, but it does not help. Pt reports that Rx cough syrup doesn't help either. Pt denies any new leg swelling.   Has a history of COPD and has visited the ER for lung problems in the past.  Pt. Is not on supplemental home oxygen.    Past Medical History  Diagnosis Date  . COPD (chronic obstructive pulmonary disease)     History reviewed. No pertinent past surgical history.  No family history on file.  History  Substance Use Topics  . Smoking status: Former Games developer  . Smokeless tobacco: Not on file  . Alcohol Use: No    Review of Systems 10 Systems reviewed and are negative for acute change except as noted in the HPI.   Allergies  Cyclobenzaprine hcl  Home Medications  No current outpatient prescriptions on file.  BP 150/83  Pulse 110  Temp(Src) 97.6 F (36.4 C) (Oral)  Resp 26  Ht 5' (1.524 m)  Wt 185 lb (83.915 kg)  BMI 36.13 kg/m2  SpO2 98%  Physical Exam  Constitutional: She is oriented to person, place, and time. She appears well-developed and well-nourished.  HENT:  Head: Normocephalic and atraumatic.  Eyes: Pupils are equal, round, and reactive to light. No scleral  icterus.  Neck: Normal range of motion. Neck supple.  Cardiovascular: Regular rhythm.        tachycardic  Pulmonary/Chest: Effort normal. She has wheezes.       expritatory wheezing bilaterally  Abdominal: Soft. There is no tenderness.  Musculoskeletal: Normal range of motion. She exhibits edema.       1 + edema lower extremities bilaterally  Neurological: She is alert and oriented to person, place, and time.  Skin: Skin is warm and dry.    ED Course  Procedures (including critical care time) DIAGNOSTIC STUDIES: Oxygen Saturation is 98% on room air, normal by my interpretation.    COORDINATION OF CARE:  Labs Reviewed  CBC - Abnormal; Notable for the following:    WBC 10.6 (*)    Hemoglobin 11.3 (*)    HCT 35.9 (*)    MCV 77.9 (*)    MCH 24.5 (*)    All other components within normal limits  DIFFERENTIAL - Abnormal; Notable for the following:    Neutrophils Relative 39 (*)    Lymphocytes Relative 51 (*)    Lymphs Abs 5.5 (*)    All other components within normal limits  COMPREHENSIVE METABOLIC PANEL - Abnormal; Notable for the following:    Creatinine, Ser 1.24 (*)    Total Bilirubin 0.2 (*)    GFR calc non Af Amer 44 (*)    GFR calc Af Amer 51 (*)  All other components within normal limits  CARDIAC PANEL(CRET KIN+CKTOT+MB+TROPI) - Abnormal; Notable for the following:    Total CK 391 (*)    CK, MB 5.5 (*)    All other components within normal limits  PRO B NATRIURETIC PEPTIDE  D-DIMER, QUANTITATIVE   Ct Angio Chest W/cm &/or Wo Cm  02/08/2011  *RADIOLOGY REPORT*  Clinical Data:  Shortness of breath for 4 days; cough and wheezing. Dyspnea on exertion, with chest tightness.  CT ANGIOGRAPHY CHEST WITH CONTRAST  Technique:  Multidetector CT imaging of the chest was performed using the standard protocol during bolus administration of intravenous contrast.  Multiplanar CT image reconstructions including MIPs were obtained to evaluate the vascular anatomy.  Contrast:  OMNIPAQUE IOHEXOL 350 MG/ML IV SOLN  Comparison:  Chest radiograph performed earlier today at 07:42 p.m., and CT of the chest performed 07/02/2003  Findings:  There is no evidence of pulmonary embolus.  Minimal right basilar atelectasis is noted.  The lungs are otherwise clear.  There is no evidence of significant focal consolidation, pleural effusion or pneumothorax.  No masses are identified; no abnormal focal contrast enhancement is seen.  The mediastinum is unremarkable in appearance.  No mediastinal lymphadenopathy is seen.  No pericardial effusion is identified. The great vessels are unremarkable in appearance; high density about the great vessels appears to reflect small collateral vessels.  No axillary lymphadenopathy is seen; scattered bilateral axillary nodes remain normal in size.  The thyroid gland is unremarkable in appearance.  The visualized portions of the liver and spleen are unremarkable. The visualized portions of the pancreas, stomach, adrenal glands and kidneys are within normal limits.  No acute osseous abnormalities are seen.  Right convex lumbar scoliosis is partially imaged.  Review of the MIP images confirms the above findings.  IMPRESSION:  1.  No evidence of pulmonary embolus. 2.  Minimal right basilar atelectasis noted; lungs otherwise clear. 3.  Right convex lumbar scoliosis noted.  Original Report Authenticated By: Tonia Ghent, M.D.   Dg Chest Portable 1 View  02/08/2011  *RADIOLOGY REPORT*  Clinical Data: Shortness of breath and congestion.  PORTABLE CHEST - 1 VIEW  Comparison: 07/20/2010  Findings: Lungs are clear.  Minimal atelectasis at the right lung base.  No edema, infiltrate or pleural effusion.  Heart size is normal.  There is stable mild ectasia of the thoracic aorta.  IMPRESSION: No active disease.  Minimal right basilar atelectasis.  Original Report Authenticated By: Reola Calkins, M.D.     No diagnosis found.    MDM  11:40 PM Pt given albuterol tx here  and wheezing improved, remains sob, chest ct neg for pe, will admit to medicine  CRITICAL CARE Performed by: Toy Baker   Total critical care time: 75  Critical care time was exclusive of separately billable procedures and treating other patients.  Critical care was necessary to treat or prevent imminent or life-threatening deterioration.  Critical care was time spent personally by me on the following activities: development of treatment plan with patient and/or surrogate as well as nursing, discussions with consultants, evaluation of patient's response to treatment, examination of patient, obtaining history from patient or surrogate, ordering and performing treatments and interventions, ordering and review of laboratory studies, ordering and review of radiographic studies, pulse oximetry and re-evaluation of patient's condition. I personally performed the services described in this documentation, which was scribed in my presence. The recorded information has been reviewed and considered.      Toy Baker,  MD 02/08/11 [id number]

## 2011-02-09 ENCOUNTER — Encounter (HOSPITAL_COMMUNITY): Payer: Self-pay | Admitting: Internal Medicine

## 2011-02-09 DIAGNOSIS — M1990 Unspecified osteoarthritis, unspecified site: Secondary | ICD-10-CM | POA: Diagnosis present

## 2011-02-09 DIAGNOSIS — F419 Anxiety disorder, unspecified: Secondary | ICD-10-CM | POA: Diagnosis present

## 2011-02-09 DIAGNOSIS — K219 Gastro-esophageal reflux disease without esophagitis: Secondary | ICD-10-CM | POA: Diagnosis present

## 2011-02-09 DIAGNOSIS — M069 Rheumatoid arthritis, unspecified: Secondary | ICD-10-CM | POA: Insufficient documentation

## 2011-02-09 DIAGNOSIS — I10 Essential (primary) hypertension: Secondary | ICD-10-CM | POA: Diagnosis present

## 2011-02-09 DIAGNOSIS — R0781 Pleurodynia: Secondary | ICD-10-CM | POA: Diagnosis present

## 2011-02-09 DIAGNOSIS — J209 Acute bronchitis, unspecified: Principal | ICD-10-CM | POA: Diagnosis present

## 2011-02-09 LAB — FERRITIN: Ferritin: 51 ng/mL (ref 10–291)

## 2011-02-09 LAB — COMPREHENSIVE METABOLIC PANEL
Albumin: 3.5 g/dL (ref 3.5–5.2)
Alkaline Phosphatase: 92 U/L (ref 39–117)
BUN: 15 mg/dL (ref 6–23)
Potassium: 4.3 mEq/L (ref 3.5–5.1)
Sodium: 135 mEq/L (ref 135–145)
Total Protein: 7.2 g/dL (ref 6.0–8.3)

## 2011-02-09 LAB — CARDIAC PANEL(CRET KIN+CKTOT+MB+TROPI)
CK, MB: 4.4 ng/mL — ABNORMAL HIGH (ref 0.3–4.0)
Relative Index: 1.3 (ref 0.0–2.5)
Troponin I: <0.3 ng/mL (ref <0.30)

## 2011-02-09 LAB — CBC
HCT: 32.9 % — ABNORMAL LOW (ref 36.0–46.0)
MCV: 77.4 fL — ABNORMAL LOW (ref 78.0–100.0)
RDW: 14.9 % (ref 11.5–15.5)
WBC: 6.7 10*3/uL (ref 4.0–10.5)

## 2011-02-09 LAB — IRON AND TIBC
Iron: 46 ug/dL (ref 42–135)
TIBC: 365 ug/dL (ref 250–470)
UIBC: 319 ug/dL (ref 125–400)

## 2011-02-09 LAB — RETICULOCYTES: Retic Count, Absolute: 68 10*3/uL (ref 19.0–186.0)

## 2011-02-09 MED ORDER — DOCUSATE SODIUM 100 MG PO CAPS
100.0000 mg | ORAL_CAPSULE | Freq: Two times a day (BID) | ORAL | Status: DC
  Administered 2011-02-09: 100 mg via ORAL
  Filled 2011-02-09: qty 1

## 2011-02-09 MED ORDER — DIPHENHYDRAMINE HCL 25 MG PO TABS
25.0000 mg | ORAL_TABLET | Freq: Every evening | ORAL | Status: DC | PRN
  Administered 2011-02-09: 25 mg via ORAL
  Filled 2011-02-09: qty 1

## 2011-02-09 MED ORDER — OXYCODONE-ACETAMINOPHEN 5-325 MG PO TABS
1.0000 | ORAL_TABLET | Freq: Four times a day (QID) | ORAL | Status: DC | PRN
  Administered 2011-02-09: 1 via ORAL

## 2011-02-09 MED ORDER — PANTOPRAZOLE SODIUM 40 MG PO TBEC: 40.0000 mg | DELAYED_RELEASE_TABLET | Freq: Every day | ORAL | Status: DC

## 2011-02-09 MED ORDER — FLUTICASONE PROPIONATE HFA 44 MCG/ACT IN AERO: 2.0000 | INHALATION_SPRAY | Freq: Two times a day (BID) | RESPIRATORY_TRACT | Status: DC

## 2011-02-09 MED ORDER — OXYCODONE-ACETAMINOPHEN 5-325 MG PO TABS
ORAL_TABLET | ORAL | Status: AC
  Filled 2011-02-09: qty 1

## 2011-02-09 MED ORDER — ONDANSETRON HCL 4 MG PO TABS: 4.0000 mg | ORAL_TABLET | Freq: Four times a day (QID) | ORAL | Status: DC | PRN

## 2011-02-09 MED ORDER — ONDANSETRON HCL 4 MG/2ML IJ SOLN: 4.0000 mg | Freq: Four times a day (QID) | INTRAMUSCULAR | Status: DC | PRN

## 2011-02-09 MED ORDER — MOXIFLOXACIN HCL 400 MG PO TABS: 400.0000 mg | ORAL_TABLET | Freq: Every day | ORAL | Status: AC

## 2011-02-09 MED ORDER — MOXIFLOXACIN HCL IN NACL 400 MG/250ML IV SOLN
INTRAVENOUS | Status: AC
  Filled 2011-02-09: qty 250

## 2011-02-09 MED ORDER — SENNA 8.6 MG PO TABS: 2.0000 | ORAL_TABLET | Freq: Every day | ORAL | Status: DC | PRN

## 2011-02-09 MED ORDER — PNEUMOCOCCAL VAC POLYVALENT 25 MCG/0.5ML IJ INJ
0.5000 mL | INJECTION | INTRAMUSCULAR | Status: DC
  Filled 2011-02-09: qty 0.5

## 2011-02-09 MED ORDER — HYDROCHLOROTHIAZIDE 12.5 MG PO CAPS
12.5000 mg | ORAL_CAPSULE | Freq: Every day | ORAL | Status: DC
  Administered 2011-02-09: 12.5 mg via ORAL
  Filled 2011-02-09: qty 1

## 2011-02-09 MED ORDER — LEVALBUTEROL HCL 0.63 MG/3ML IN NEBU: 0.6300 mg | INHALATION_SOLUTION | RESPIRATORY_TRACT | Status: DC | PRN

## 2011-02-09 MED ORDER — HEPARIN SODIUM (PORCINE) 5000 UNIT/ML IJ SOLN
5000.0000 [IU] | Freq: Three times a day (TID) | INTRAMUSCULAR | Status: DC
  Administered 2011-02-09: 5000 [IU] via SUBCUTANEOUS
  Filled 2011-02-09: qty 1

## 2011-02-09 MED ORDER — ALPRAZOLAM 0.5 MG PO TABS
0.5000 mg | ORAL_TABLET | Freq: Three times a day (TID) | ORAL | Status: DC | PRN
  Administered 2011-02-09: 0.5 mg via ORAL

## 2011-02-09 MED ORDER — PREDNISONE 10 MG PO TABS: 10.0000 mg | ORAL_TABLET | Freq: Every day | ORAL | Status: AC

## 2011-02-09 MED ORDER — LEVALBUTEROL HCL 0.63 MG/3ML IN NEBU
INHALATION_SOLUTION | RESPIRATORY_TRACT | Status: AC
  Filled 2011-02-09: qty 3

## 2011-02-09 MED ORDER — METHYLPREDNISOLONE SODIUM SUCC 125 MG IJ SOLR
60.0000 mg | Freq: Four times a day (QID) | INTRAMUSCULAR | Status: DC
  Administered 2011-02-09: 60 mg via INTRAVENOUS

## 2011-02-09 MED ORDER — IPRATROPIUM BROMIDE 0.02 % IN SOLN
RESPIRATORY_TRACT | Status: AC
  Filled 2011-02-09: qty 2.5

## 2011-02-09 MED ORDER — ACETAMINOPHEN 650 MG RE SUPP: 650.0000 mg | Freq: Four times a day (QID) | RECTAL | Status: DC | PRN

## 2011-02-09 MED ORDER — BENZONATATE 100 MG PO CAPS
100.0000 mg | ORAL_CAPSULE | Freq: Three times a day (TID) | ORAL | Status: DC
  Administered 2011-02-09: 100 mg via ORAL
  Filled 2011-02-09: qty 1

## 2011-02-09 MED ORDER — DIPHENHYDRAMINE HCL 25 MG PO CAPS: 25.0000 mg | ORAL_CAPSULE | Freq: Every evening | ORAL | Status: DC | PRN

## 2011-02-09 MED ORDER — METHYLPREDNISOLONE SODIUM SUCC 125 MG IJ SOLR
INTRAMUSCULAR | Status: AC
  Filled 2011-02-09: qty 2

## 2011-02-09 MED ORDER — MOXIFLOXACIN HCL IN NACL 400 MG/250ML IV SOLN
400.0000 mg | Freq: Every day | INTRAVENOUS | Status: DC
  Administered 2011-02-09: 400 mg via INTRAVENOUS
  Filled 2011-02-09 (×2): qty 250

## 2011-02-09 MED ORDER — DIPHENHYDRAMINE HCL 25 MG PO CAPS
ORAL_CAPSULE | ORAL | Status: AC
  Filled 2011-02-09: qty 1

## 2011-02-09 MED ORDER — ALPRAZOLAM 0.5 MG PO TABS
ORAL_TABLET | ORAL | Status: AC
  Filled 2011-02-09: qty 1

## 2011-02-09 MED ORDER — OLMESARTAN MEDOXOMIL 20 MG PO TABS
40.0000 mg | ORAL_TABLET | Freq: Every day | ORAL | Status: DC
  Administered 2011-02-09: 40 mg via ORAL
  Filled 2011-02-09: qty 2

## 2011-02-09 MED ORDER — SODIUM CHLORIDE 0.9 % IV SOLN
INTRAVENOUS | Status: DC
  Administered 2011-02-09: 1000 mL via INTRAVENOUS

## 2011-02-09 MED ORDER — DOCUSATE SODIUM 100 MG PO CAPS
ORAL_CAPSULE | ORAL | Status: AC
  Filled 2011-02-09: qty 1

## 2011-02-09 MED ORDER — IPRATROPIUM BROMIDE 0.02 % IN SOLN
0.5000 mg | Freq: Four times a day (QID) | RESPIRATORY_TRACT | Status: DC
  Administered 2011-02-09 (×2): 0.5 mg via RESPIRATORY_TRACT

## 2011-02-09 MED ORDER — LEVALBUTEROL HCL 0.63 MG/3ML IN NEBU
0.6300 mg | INHALATION_SOLUTION | Freq: Four times a day (QID) | RESPIRATORY_TRACT | Status: DC
  Administered 2011-02-09 (×2): 0.63 mg via RESPIRATORY_TRACT

## 2011-02-09 MED ORDER — BENZONATATE 100 MG PO CAPS: 100.0000 mg | ORAL_CAPSULE | Freq: Three times a day (TID) | ORAL | Status: AC

## 2011-02-09 MED ORDER — ALBUTEROL SULFATE (5 MG/ML) 0.5% IN NEBU
2.5000 mg | INHALATION_SOLUTION | Freq: Once | RESPIRATORY_TRACT | Status: DC
Start: 2011-02-09 — End: 2011-02-09

## 2011-02-09 MED ORDER — TELMISARTAN-HCTZ 80-12.5 MG PO TABS: 1.0000 | ORAL_TABLET | Freq: Every day | ORAL | Status: DC

## 2011-02-09 MED ORDER — CALCIUM CARBONATE-VITAMIN D 500-200 MG-UNIT PO TABS
1.0000 | ORAL_TABLET | Freq: Two times a day (BID) | ORAL | Status: DC
  Administered 2011-02-09: 1 via ORAL
  Filled 2011-02-09: qty 1

## 2011-02-09 MED ORDER — ACETAMINOPHEN 325 MG PO TABS: 650.0000 mg | ORAL_TABLET | Freq: Four times a day (QID) | ORAL | Status: DC | PRN

## 2011-02-09 MED ORDER — MORPHINE SULFATE 4 MG/ML IJ SOLN: 2.0000 mg | INTRAMUSCULAR | Status: DC | PRN

## 2011-02-09 NOTE — ED Notes (Signed)
Pt calmer now. Waiting to be eval for admission

## 2011-02-09 NOTE — Progress Notes (Signed)
Discharge Summary: a.o.vss. Up ad lib. Saline lock removed. Discharge instructions given. prescriptions discussed. Pt verbalized understanding of instructions. Left floor via wheelchair with nursing staff and family member.

## 2011-02-09 NOTE — Progress Notes (Signed)
CARE MANAGEMENT NOTE 02/09/2011  Patient:  Glenda Terry, Glenda Terry   Account Number:  [id number]  Date Initiated:  02/09/2011  Documentation initiated by:  Anibal Henderson  Subjective/Objective Assessment:   admitted with acute bronchitis. pt lives at home, alone. She is independent, and will return home. She has a nebulizer, and inhalers at home. her daughter lives nearby and assists if needed     Action/Plan:   no needs identified   Anticipated DC Date:  02/09/2011   Anticipated DC Plan:  HOME/SELF CARE      DC Planning Services  CM consult      Choice offered to / List presented to:             Status of service:  Completed, signed off Medicare Important Message given?   (If response is "NO", the following Medicare IM given date fields will be blank) Date Medicare IM given:   Date Additional Medicare IM given:    Discharge Disposition:  HOME/SELF CARE  Per UR Regulation:    Comments:  02/09/11 1100 Filbert Berthold RN

## 2011-02-09 NOTE — ED Notes (Signed)
Pt sitting up on stretcher coughing and asking for something for her cough. Pt waiting to receive Albuterol neb. Pt returned to O2 2l/[place].  Pt continues to cough but appears to be more calm.

## 2011-02-09 NOTE — H&P (Signed)
Glenda Terry is an 70 y.o. female.    PCP: Colette Ribas, MD   Chief Complaint: Shortness of breath, and cough  HPI: This is a 70 year old, African American female, with a past with history of for chronic back pain, anxiety disorder, hypertension, who was in her usual state of health of few days ago, actually, Wednesday night when she started having a cough, along with shortness of breath. The cough produced white sputum with no blood. The symptoms continued for the following few days and then got worse. She was taking a cough syrup up for the cough, which was making her feel better. However, only transiently. She tried taking nebulizer treatments and inhalers at home, which did not really help. She went to her doctor's office today. However, then had a panic attack and, so, she could not wait to be seen and, then decided to come in to the emergency department. She denies any fever or any leg swelling. She does have chest pain, which increases with cough and deep breathing. She also feels pain, when she pushes on her chest. Denies any nausea, vomiting. She had similar episodes back in April and May of this year. Still feels a little anxious, however, is feeling some better after receiving nebulizer treatments.   Prior to Admission medications   Medication Sig Start Date End Date Taking? Authorizing Provider  albuterol (PROAIR HFA) 108 (90 BASE) MCG/ACT inhaler Inhale 2 puffs into the lungs every 6 (six) hours as needed. For COPD    Yes Historical Provider, MD  albuterol (PROVENTIL) (2.5 MG/3ML) 0.083% nebulizer solution Take 2.5 mg by nebulization every 6 (six) hours as needed. For shortness of breath    Yes Historical Provider, MD  ALPRAZolam (XANAX) 0.5 MG tablet Take 0.5 mg by mouth every 8 (eight) hours as needed. For anxiety    Yes Historical Provider, MD  Beclomethasone Dipropionate (QVAR IN) Inhale 1-2 puffs into the lungs daily.     Yes Historical Provider, MD  Calcium  Carbonate-Vitamin D (CALCIUM 600 + D PO) Take 1 tablet by mouth daily.     Yes Historical Provider, MD  Cholecalciferol (VITAMIN D3) 2000 UNITS capsule Take 2,000 Units by mouth daily.     Yes Historical Provider, MD  diphenhydrAMINE (BENADRYL) 25 MG tablet Take 25 mg by mouth at bedtime as needed. For allergies    Yes Historical Provider, MD  esomeprazole (NEXIUM) 40 MG capsule Take 40 mg by mouth daily before breakfast.     Yes Historical Provider, MD  HYDROcodone-homatropine (HYCODAN) 5-1.5 MG/5ML syrup Take 5 mLs by mouth every 6 (six) hours as needed. For cough    Yes Historical Provider, MD  oxyCODONE-acetaminophen (PERCOCET) 5-325 MG per tablet Take 1 tablet by mouth daily as needed. For pain    Yes Historical Provider, MD  telmisartan-hydrochlorothiazide (MICARDIS HCT) 80-12.5 MG per tablet Take 1 tablet by mouth daily.     Yes Historical Provider, MD    Allergies:  Allergies  Allergen Reactions  . Other Anaphylaxis    ALL TREE NUTS  . Cyclobenzaprine Hcl Swelling    Swelling in neck    Past Medical History  Diagnosis Date  . COPD (chronic obstructive pulmonary disease)   . Back pain, chronic     Past Surgical History  Procedure Date  . Hysterotomy   . Tubal ligation   . Abdominal hysterectomy   . Knee arthroscopy     Social History:  reports that she has quit smoking. She does not have  any smokeless tobacco history on file. She reports that she does not drink alcohol or use illicit drugs.  Family History:  Family History  Problem Relation Age of Onset  . Breast cancer Sister    There is also history of, diabetes, heart disease, hypertension, and colon cancer in the family.  Review of Systems - History obtained from the patient and daughters General ROS: positive for  - malaise Psychological ROS: positive for - anxiety Hematological and Lymphatic ROS: negative Endocrine ROS: negative Breast ROS: positive for - new or changing breast lumps Respiratory ROS: as in  HPI Cardiovascular ROS: positive for - chest pain Gastrointestinal ROS: no abdominal pain, change in bowel habits, or black or bloody stools Genito-Urinary ROS: no dysuria, trouble voiding, or hematuria Musculoskeletal ROS: arthritic pains Neurological ROS: negative Dermatological ROS: negative  Physical Examination Blood pressure 150/83, pulse 110, temperature 97.6 F (36.4 C), temperature source Oral, resp. rate 26, height 5' (1.524 m), weight 83.915 kg (185 lb), SpO2 100.00%.  General appearance: alert, cooperative and no distress Head: Normocephalic, without obvious abnormality, atraumatic Eyes: conjunctivae/corneas clear. PERRL, EOM's intact. Fundi benign. Throat: lips, mucosa, and tongue normal; teeth and gums normal Neck: no adenopathy, no carotid bruit, no JVD, supple, symmetrical, trachea midline and thyroid not enlarged, symmetric, no tenderness/mass/nodules Resp: scatterred wheezing b/l. No crackles. Decreased air entry at the bases Chest wall: mild tenderness to palpation Cardio: regular rate and rhythm, S1, S2 normal, no murmur, click, rub or gallop GI: soft, non-tender; bowel sounds normal; no masses,  no organomegaly Extremities: extremities normal, atraumatic, no cyanosis or edema Pulses: 2+ and symmetric Skin: Skin color, texture, turgor normal. No rashes or lesions Lymph nodes: Cervical, supraclavicular, and axillary nodes normal. Neurologic: Grossly normal  Results for orders placed during the hospital encounter of 02/08/11 (from the past 48 hour(s))  CBC     Status: Abnormal   Collection Time   02/08/11  8:15 PM      Component Value Range Comment   WBC 10.6 (*) 4.0 - 10.5 (K/uL)    RBC 4.61  3.87 - 5.11 (MIL/uL)    Hemoglobin 11.3 (*) 12.0 - 15.0 (g/dL)    HCT 40.9 (*) 81.1 - 46.0 (%)    MCV 77.9 (*) 78.0 - 100.0 (fL)    MCH 24.5 (*) 26.0 - 34.0 (pg)    MCHC 31.5  30.0 - 36.0 (g/dL)    RDW 91.4  78.2 - 95.6 (%)    Platelets 295  150 - 400 (K/uL)     DIFFERENTIAL     Status: Abnormal   Collection Time   02/08/11  8:15 PM      Component Value Range Comment   Neutrophils Relative 39 (*) 43 - 77 (%)    Lymphocytes Relative 51 (*) 12 - 46 (%)    Monocytes Relative 7  3 - 12 (%)    Eosinophils Relative 2  0 - 5 (%)    Basophils Relative 1  0 - 1 (%)    Neutro Abs 4.1  1.7 - 7.7 (K/uL)    Lymphs Abs 5.5 (*) 0.7 - 4.0 (K/uL)    Monocytes Absolute 0.7  0.1 - 1.0 (K/uL)    Eosinophils Absolute 0.2  0.0 - 0.7 (K/uL)    Basophils Absolute 0.1  0.0 - 0.1 (K/uL)   COMPREHENSIVE METABOLIC PANEL     Status: Abnormal   Collection Time   02/08/11  8:15 PM      Component Value Range Comment  Sodium 135  135 - 145 (mEq/L)    Potassium 4.2  3.5 - 5.1 (mEq/L)    Chloride 97  96 - 112 (mEq/L)    CO2 29  19 - 32 (mEq/L)    Glucose, Bld 95  70 - 99 (mg/dL)    BUN 14  6 - 23 (mg/dL)    Creatinine, Ser 1.61 (*) 0.50 - 1.10 (mg/dL)    Calcium 9.9  8.4 - 10.5 (mg/dL)    Total Protein 7.5  6.0 - 8.3 (g/dL)    Albumin 3.7  3.5 - 5.2 (g/dL)    AST 25  0 - 37 (U/L)    ALT 21  0 - 35 (U/L)    Alkaline Phosphatase 96  39 - 117 (U/L)    Total Bilirubin 0.2 (*) 0.3 - 1.2 (mg/dL)    GFR calc non Af Amer 44 (*) >90 (mL/min)    GFR calc Af Amer 51 (*) >90 (mL/min)   PRO B NATRIURETIC PEPTIDE     Status: Normal   Collection Time   02/08/11  8:15 PM      Component Value Range Comment   BNP, POC 42.3  0 - 125 (pg/mL)   CARDIAC PANEL(CRET KIN+CKTOT+MB+TROPI)     Status: Abnormal   Collection Time   02/08/11  8:15 PM      Component Value Range Comment   Total CK 391 (*) 7 - 177 (U/L)    CK, MB 5.5 (*) 0.3 - 4.0 (ng/mL)    Troponin I <0.30  <0.30 (ng/mL)    Relative Index 1.4  0.0 - 2.5    D-DIMER, QUANTITATIVE     Status: Normal   Collection Time   02/08/11  8:15 PM      Component Value Range Comment   D-Dimer, Quant 0.40  0.00 - 0.48 (ug/mL-FEU)    Ct Angio Chest W/cm &/or Wo Cm  02/08/2011  *RADIOLOGY REPORT*  Clinical Data:  Shortness of breath  for 4 days; cough and wheezing. Dyspnea on exertion, with chest tightness.  CT ANGIOGRAPHY CHEST WITH CONTRAST  Technique:  Multidetector CT imaging of the chest was performed using the standard protocol during bolus administration of intravenous contrast.  Multiplanar CT image reconstructions including MIPs were obtained to evaluate the vascular anatomy.  Contrast: OMNIPAQUE IOHEXOL 350 MG/ML IV SOLN  Comparison:  Chest radiograph performed earlier today at 07:42 p.m., and CT of the chest performed 07/02/2003  Findings:  There is no evidence of pulmonary embolus.  Minimal right basilar atelectasis is noted.  The lungs are otherwise clear.  There is no evidence of significant focal consolidation, pleural effusion or pneumothorax.  No masses are identified; no abnormal focal contrast enhancement is seen.  The mediastinum is unremarkable in appearance.  No mediastinal lymphadenopathy is seen.  No pericardial effusion is identified. The great vessels are unremarkable in appearance; high density about the great vessels appears to reflect small collateral vessels.  No axillary lymphadenopathy is seen; scattered bilateral axillary nodes remain normal in size.  The thyroid gland is unremarkable in appearance.  The visualized portions of the liver and spleen are unremarkable. The visualized portions of the pancreas, stomach, adrenal glands and kidneys are within normal limits.  No acute osseous abnormalities are seen.  Right convex lumbar scoliosis is partially imaged.  Review of the MIP images confirms the above findings.  IMPRESSION:  1.  No evidence of pulmonary embolus. 2.  Minimal right basilar atelectasis noted; lungs otherwise clear. 3.  Right convex lumbar scoliosis noted.  Original Report Authenticated By: Tonia Ghent, M.D.   Dg Chest Portable 1 View  02/08/2011  *RADIOLOGY REPORT*  Clinical Data: Shortness of breath and congestion.  PORTABLE CHEST - 1 VIEW  Comparison: 07/20/2010  Findings: Lungs are  clear.  Minimal atelectasis at the right lung base.  No edema, infiltrate or pleural effusion.  Heart size is normal.  There is stable mild ectasia of the thoracic aorta.  IMPRESSION: No active disease.  Minimal right basilar atelectasis.  Original Report Authenticated By: Reola Calkins, M.D.     Assessment/Plan  Principal Problem:  *Acute bronchitis Active Problems:  Anxiety  Pleuritic chest pain  HTN (hypertension)  Arthritis  GERD (gastroesophageal reflux disease)   #1 acute bronchitis: This will be treated with nebulizer treatments, steroids, antibiotics. Inhale steroids will also be given. She most likely does have a diagnosis of COPD, however, she's tells me, that she's had pulmonary function testing done, which has been equivocal.  #2 pleuritic chest pain: This is likely related to the bronchitis. Cardiac enzymes are negative. We will however, get EKG, as that has not been done yet.  #3 anxiety: Give her Xanax as needed. She may benefit from being on a long acting anxiolytic like Lexapro. We will discuss this issue when she is feeling better.  #4 history of hypertension. Continue with current antihypertensive agents.   #5 elevated creatinine: This appears to be at her baseline based on previous values.  #6 microcytic anemia: We'll check an anemia panel.  #7 concerned about breast lesions: She's also concerned because her sister was recently diagnosed with breast cancer. She is scheduled for a mammogram on December 4, and she's very anxious about this upcoming test.  #8 history of GERD continue with PPI as  #9 history of arthritis and chronic back pain: Give pain medication as needed.  DVT, prophylaxis will be provided. She is a full code.  Further management decisions will depend on results of further testing and patient's response to treatment.  Linwood Gullikson 02/09/2011, 12:13 AM

## 2011-02-09 NOTE — Discharge Summary (Signed)
Admit date: 02/08/2011 Discharge date: 02/09/2011  Primary Care Physician:  Colette Ribas, MD   Discharge Diagnoses:   Active Hospital Problems  Diagnoses Date Noted   . Anxiety 02/09/2011   . HTN (hypertension) 02/09/2011   . Arthritis 02/09/2011   . GERD (gastroesophageal reflux disease) 02/09/2011     Resolved Hospital Problems  Diagnoses Date Noted Date Resolved  . Acute bronchitis 02/09/2011 02/09/2011  . Pleuritic chest pain 02/09/2011 02/09/2011     DISCHARGE MEDICATION: Current Discharge Medication List    START taking these medications   Details  benzonatate (TESSALON) 100 MG capsule Take 1 capsule (100 mg total) by mouth 3 (three) times daily. Qty: 20 capsule, Refills: 0    moxifloxacin (AVELOX) 400 MG tablet Take 1 tablet (400 mg total) by mouth daily. Qty: 6 tablet, Refills: 6    predniSONE (DELTASONE) 10 MG tablet Take 1 tablet (10 mg total) by mouth daily. Qty: 42 tablet, Refills: 0      CONTINUE these medications which have NOT CHANGED   Details  albuterol (PROAIR HFA) 108 (90 BASE) MCG/ACT inhaler Inhale 2 puffs into the lungs every 6 (six) hours as needed. For COPD     ALPRAZolam (XANAX) 0.5 MG tablet Take 0.5 mg by mouth every 8 (eight) hours as needed. For anxiety     Beclomethasone Dipropionate (QVAR IN) Inhale 1-2 puffs into the lungs daily.      Calcium Carbonate-Vitamin D (CALCIUM 600 + D PO) Take 1 tablet by mouth daily.      Cholecalciferol (VITAMIN D3) 2000 UNITS capsule Take 2,000 Units by mouth daily.      diphenhydrAMINE (BENADRYL) 25 MG tablet Take 25 mg by mouth at bedtime as needed. For allergies     esomeprazole (NEXIUM) 40 MG capsule Take 40 mg by mouth daily before breakfast.      HYDROcodone-homatropine (HYCODAN) 5-1.5 MG/5ML syrup Take 5 mLs by mouth every 6 (six) hours as needed. For cough     oxyCODONE-acetaminophen (PERCOCET) 5-325 MG per tablet Take 1 tablet by mouth daily as needed. For pain       telmisartan-hydrochlorothiazide (MICARDIS HCT) 80-12.5 MG per tablet Take 1 tablet by mouth daily.        STOP taking these medications     albuterol (PROVENTIL) (2.5 MG/3ML) 0.083% nebulizer solution            Consults:   None  SIGNIFICANT DIAGNOSTIC STUDIES:  Ct Angio Chest W/cm &/or Wo Cm  02/08/2011  *RADIOLOGY REPORT*  Clinical Data:  Shortness of breath for 4 days; cough and wheezing. Dyspnea on exertion, with chest tightness.  CT ANGIOGRAPHY CHEST WITH CONTRAST  Technique:  Multidetector CT imaging of the chest was performed using the standard protocol during bolus administration of intravenous contrast.  Multiplanar CT image reconstructions including MIPs were obtained to evaluate the vascular anatomy.  Contrast: OMNIPAQUE IOHEXOL 350 MG/ML IV SOLN  Comparison:  Chest radiograph performed earlier today at 07:42 p.m., and CT of the chest performed 07/02/2003  Findings:  There is no evidence of pulmonary embolus.  Minimal right basilar atelectasis is noted.  The lungs are otherwise clear.  There is no evidence of significant focal consolidation, pleural effusion or pneumothorax.  No masses are identified; no abnormal focal contrast enhancement is seen.  The mediastinum is unremarkable in appearance.  No mediastinal lymphadenopathy is seen.  No pericardial effusion is identified. The great vessels are unremarkable in appearance; high density about the great vessels appears to reflect small  collateral vessels.  No axillary lymphadenopathy is seen; scattered bilateral axillary nodes remain normal in size.  The thyroid gland is unremarkable in appearance.  The visualized portions of the liver and spleen are unremarkable. The visualized portions of the pancreas, stomach, adrenal glands and kidneys are within normal limits.  No acute osseous abnormalities are seen.  Right convex lumbar scoliosis is partially imaged.  Review of the MIP images confirms the above findings.  IMPRESSION:  1.   No evidence of pulmonary embolus. 2.  Minimal right basilar atelectasis noted; lungs otherwise clear. 3.  Right convex lumbar scoliosis noted.  Original Report Authenticated By: Tonia Ghent, M.D.   Dg Chest Portable 1 View  02/08/2011  *RADIOLOGY REPORT*  Clinical Data: Shortness of breath and congestion.  PORTABLE CHEST - 1 VIEW  Comparison: 07/20/2010  Findings: Lungs are clear.  Minimal atelectasis at the right lung base.  No edema, infiltrate or pleural effusion.  Heart size is normal.  There is stable mild ectasia of the thoracic aorta.  IMPRESSION: No active disease.  Minimal right basilar atelectasis.  Original Report Authenticated By: Reola Calkins, M.D.       No results found for this or any previous visit (from the past 240 hour(s)).  BRIEF ADMITTING H & P: This is a 70 year old, African American female, with a past with history of for chronic back pain, anxiety disorder, hypertension, who was in her usual state of health of few days ago, actually, Wednesday night when she started having a cough, along with shortness of breath. The cough produced white sputum with no blood. The symptoms continued for the following few days and then got worse. She was taking a cough syrup up for the cough, which was making her feel better. However, only transiently. She tried taking nebulizer treatments and inhalers at home, which did not really help. She went to her doctor's office today. However, then had a panic attack and, so, she could not wait to be seen and, then decided to come in to the emergency department. She denies any fever or any leg swelling. She does have chest pain, which increases with cough and deep breathing. She also feels pain, when she pushes on her chest. Denies any nausea, vomiting. She had similar episodes back in April and May of this year. Still feels a little anxious, however, is feeling some better after receiving nebulizer treatments.  Results for orders placed during the  hospital encounter of 02/08/11 (from the past 48 hour(s))   CBC Status: Abnormal    Collection Time    02/08/11 8:15 PM   Component  Value  Range  Comment    WBC  10.6 (*)  4.0 - 10.5 (K/uL)     RBC  4.61  3.87 - 5.11 (MIL/uL)     Hemoglobin  11.3 (*)  12.0 - 15.0 (g/dL)     HCT  16.1 (*)  09.6 - 46.0 (%)     MCV  77.9 (*)  78.0 - 100.0 (fL)     MCH  24.5 (*)  26.0 - 34.0 (pg)     MCHC  31.5  30.0 - 36.0 (g/dL)     RDW  04.5  40.9 - 15.5 (%)     Platelets  295  150 - 400 (K/uL)    DIFFERENTIAL Status: Abnormal    Collection Time    02/08/11 8:15 PM   Component  Value  Range  Comment    Neutrophils Relative  39 (*)  43 - 77 (%)     Lymphocytes Relative  51 (*)  12 - 46 (%)     Monocytes Relative  7  3 - 12 (%)     Eosinophils Relative  2  0 - 5 (%)     Basophils Relative  1  0 - 1 (%)     Neutro Abs  4.1  1.7 - 7.7 (K/uL)     Lymphs Abs  5.5 (*)  0.7 - 4.0 (K/uL)     Monocytes Absolute  0.7  0.1 - 1.0 (K/uL)     Eosinophils Absolute  0.2  0.0 - 0.7 (K/uL)     Basophils Absolute  0.1  0.0 - 0.1 (K/uL)    COMPREHENSIVE METABOLIC PANEL Status: Abnormal    Collection Time    02/08/11 8:15 PM   Component  Value  Range  Comment    Sodium  135  135 - 145 (mEq/L)     Potassium  4.2  3.5 - 5.1 (mEq/L)     Chloride  97  96 - 112 (mEq/L)     CO2  29  19 - 32 (mEq/L)     Glucose, Bld  95  70 - 99 (mg/dL)     BUN  14  6 - 23 (mg/dL)     Creatinine, Ser  0.86 (*)  0.50 - 1.10 (mg/dL)     Calcium  9.9  8.4 - 10.5 (mg/dL)     Total Protein  7.5  6.0 - 8.3 (g/dL)     Albumin  3.7  3.5 - 5.2 (g/dL)     AST  25  0 - 37 (U/L)     ALT  21  0 - 35 (U/L)     Alkaline Phosphatase  96  39 - 117 (U/L)     Total Bilirubin  0.2 (*)  0.3 - 1.2 (mg/dL)     GFR calc non Af Amer  44 (*)  >90 (mL/min)     GFR calc Af Amer  51 (*)  >90 (mL/min)    PRO B NATRIURETIC PEPTIDE Status: Normal    Collection Time    02/08/11 8:15 PM   Component  Value  Range  Comment    BNP, POC  42.3  0 - 125 (pg/mL)      CARDIAC PANEL(CRET KIN+CKTOT+MB+TROPI) Status: Abnormal    Collection Time    02/08/11 8:15 PM   Component  Value  Range  Comment    Total CK  391 (*)  7 - 177 (U/L)     CK, MB  5.5 (*)  0.3 - 4.0 (ng/mL)     Troponin I  <0.30  <0.30 (ng/mL)     Relative Index  1.4  0.0 - 2.5    D-DIMER, QUANTITATIVE Status: Normal    Collection Time    02/08/11 8:15 PM   Component  Value  Range  Comment    D-Dimer, Quant  0.40  0.00 - 0.48 (ug/mL-FEU)      Active Hospital Problems  Diagnoses Date Noted   . Anxiety: Stable  02/09/2011   . HTN (hypertension): Stable, no changes were made  02/09/2011   . Arthritis: Stable, no changes were made  02/09/2011   . GERD (gastroesophageal reflux disease): Stable . 02/09/2011     Resolved Hospital Problems  Diagnoses Date Noted Date Resolved  . Acute bronchitis: The patient was admitted to telemetry she had no events. CT imaging and chest x-ray were done  with results as above.she was started on IV antibiotics and IV steroids. Shortness of breath significant to the point where she was ambulating the halls without getting desats. She was changed to by mouth and was discharged home. She will followup with her primary care Dr.  02/09/2011 02/09/2011  . Pleuritic chest pain: CT image it was done that was negative. Does resolve with treatment of acute bronchitis.  02/09/2011 02/09/2011     Disposition and Follow-up:  Discharge Orders    Future Appointments: Provider: Department: Dept Phone: Center:   02/17/2011  4:00 PM [facility] [phone] [facility]     Future Orders Please Complete By Expires   Diet - low sodium heart healthy      Increase activity slowly        Follow-up Information    Follow up with GOLDING,JOHN CABOT in 2 weeks. (As needed)    Contact information:   [address] [place] [address] [phone]           DISCHARGE EXAM:   General appearance: alert, cooperative  and no distress , not using accessory muscles to breathe  Throat: lips, mucosa, and tongue normal; teeth and gums normal  Neck: no adenopathy, no carotid bruit, no JVD, supple, symmetrical, trachea midline and thyroid not enlarged, symmetric, no tenderness/mass/nodules  Resp: Good air movement clear to Cardio: regular rate and rhythm, S1, S2 normal, no murmur, click, rub or gallop . No tenderness to palpation of the chest wall. GI: soft, non-tender; bowel sounds normal; no masses, no organomegaly  Extremities: extremities normal, atraumatic, no cyanosis or edema  Blood pressure 113/75, pulse 78, temperature 97.5 F (36.4 C), temperature source Oral, resp. rate 20, height 5' (1.524 m), weight 83.6 kg (184 lb 4.9 oz), SpO2 97.00%.   Basename 02/09/11 0507 02/08/11 2015  NA 135 135  K 4.3 4.2  CL 97 97  CO2 26 29  GLUCOSE 148* 95  BUN 15 14  CREATININE 0.96 1.24*  CALCIUM 9.9 9.9  MG -- --  PHOS -- --    Basename 02/09/11 0507 02/08/11 2015  AST 23 25  ALT 21 21  ALKPHOS 92 96  BILITOT 0.2* 0.2*  PROT 7.2 7.5  ALBUMIN 3.5 3.7   No results found for this basename: LIPASE:2,AMYLASE:2 in the last 72 hours  Basename 02/09/11 0507 02/08/11 2015  WBC 6.7 10.6*  NEUTROABS -- 4.1  HGB 10.5* 11.3*  HCT 32.9* 35.9*  MCV 77.4* 77.9*  PLT 262 295    Signed: Marinda Elk M.D. 02/09/2011, 10:31 AM

## 2011-02-17 ENCOUNTER — Encounter (HOSPITAL_COMMUNITY): Payer: Medicare Other

## 2011-02-17 ENCOUNTER — Ambulatory Visit (HOSPITAL_COMMUNITY)
Admission: RE | Admit: 2011-02-17 | Discharge: 2011-02-17 | Disposition: A | Discharge status: Home / Self Care | Payer: Medicare Other | Source: Ambulatory Visit | Attending: Adult Health | Admitting: Adult Health

## 2011-02-17 ENCOUNTER — Other Ambulatory Visit (HOSPITAL_COMMUNITY): Payer: Self-pay | Admitting: Adult Health

## 2011-02-17 DIAGNOSIS — R52 Pain, unspecified: Secondary | ICD-10-CM

## 2011-02-17 DIAGNOSIS — N644 Mastodynia: Secondary | ICD-10-CM | POA: Insufficient documentation

## 2011-02-17 DIAGNOSIS — N63 Unspecified lump in unspecified breast: Secondary | ICD-10-CM

## 2011-05-17 ENCOUNTER — Other Ambulatory Visit: Payer: Self-pay

## 2011-05-17 ENCOUNTER — Emergency Department (HOSPITAL_COMMUNITY): Payer: Medicare Other

## 2011-05-17 ENCOUNTER — Emergency Department (HOSPITAL_COMMUNITY)
Admission: EM | Admit: 2011-05-17 | Discharge: 2011-05-17 | Disposition: A | Discharge status: Home / Self Care | Payer: Medicare Other | Attending: Emergency Medicine | Admitting: Emergency Medicine

## 2011-05-17 ENCOUNTER — Encounter (HOSPITAL_COMMUNITY): Payer: Self-pay | Admitting: *Deleted

## 2011-05-17 DIAGNOSIS — J4489 Other specified chronic obstructive pulmonary disease: Secondary | ICD-10-CM | POA: Insufficient documentation

## 2011-05-17 DIAGNOSIS — R112 Nausea with vomiting, unspecified: Secondary | ICD-10-CM | POA: Insufficient documentation

## 2011-05-17 DIAGNOSIS — Z79899 Other long term (current) drug therapy: Secondary | ICD-10-CM | POA: Insufficient documentation

## 2011-05-17 DIAGNOSIS — I10 Essential (primary) hypertension: Secondary | ICD-10-CM | POA: Insufficient documentation

## 2011-05-17 DIAGNOSIS — R1110 Vomiting, unspecified: Secondary | ICD-10-CM

## 2011-05-17 DIAGNOSIS — J449 Chronic obstructive pulmonary disease, unspecified: Secondary | ICD-10-CM | POA: Insufficient documentation

## 2011-05-17 DIAGNOSIS — G8929 Other chronic pain: Secondary | ICD-10-CM | POA: Insufficient documentation

## 2011-05-17 DIAGNOSIS — R109 Unspecified abdominal pain: Secondary | ICD-10-CM | POA: Insufficient documentation

## 2011-05-17 LAB — COMPREHENSIVE METABOLIC PANEL
ALT: 30 U/L (ref 0–35)
AST: 27 U/L (ref 0–37)
Albumin: 3.7 g/dL (ref 3.5–5.2)
Alkaline Phosphatase: 75 U/L (ref 39–117)
BUN: 22 mg/dL (ref 6–23)
CO2: 29 mEq/L (ref 19–32)
Calcium: 9.7 mg/dL (ref 8.4–10.5)
Chloride: 100 mEq/L (ref 96–112)
Creatinine, Ser: 1 mg/dL (ref 0.50–1.10)
GFR calc Af Amer: 66 mL/min — ABNORMAL LOW (ref >90)
GFR calc non Af Amer: 57 mL/min — ABNORMAL LOW (ref >90)
Glucose, Bld: 126 mg/dL — ABNORMAL HIGH (ref 70–99)
Potassium: 3.8 mEq/L (ref 3.5–5.1)
Sodium: 139 mEq/L (ref 135–145)
Total Bilirubin: 0.2 mg/dL — ABNORMAL LOW (ref 0.3–1.2)
Total Protein: 7.5 g/dL (ref 6.0–8.3)

## 2011-05-17 LAB — URINALYSIS, ROUTINE W REFLEX MICROSCOPIC
Bilirubin Urine: NEGATIVE
Glucose, UA: NEGATIVE mg/dL
Ketones, ur: NEGATIVE mg/dL
Leukocytes, UA: NEGATIVE
Nitrite: NEGATIVE
Protein, ur: NEGATIVE mg/dL
Specific Gravity, Urine: 1.015 (ref 1.005–1.030)
Urobilinogen, UA: 0.2 mg/dL (ref 0.0–1.0)
pH: 6 (ref 5.0–8.0)

## 2011-05-17 LAB — CBC
HCT: 37.3 % (ref 36.0–46.0)
Hemoglobin: 11.7 g/dL — ABNORMAL LOW (ref 12.0–15.0)
MCH: 24.9 pg — ABNORMAL LOW (ref 26.0–34.0)
MCHC: 31.4 g/dL (ref 30.0–36.0)
MCV: 79.4 fL (ref 78.0–100.0)
Platelets: 275 10*3/uL (ref 150–400)
RBC: 4.7 MIL/uL (ref 3.87–5.11)
RDW: 15.8 % — ABNORMAL HIGH (ref 11.5–15.5)
WBC: 8.7 10*3/uL (ref 4.0–10.5)

## 2011-05-17 LAB — URINE MICROSCOPIC-ADD ON

## 2011-05-17 LAB — LIPASE, BLOOD: Lipase: 110 U/L — ABNORMAL HIGH (ref 11–59)

## 2011-05-17 MED ORDER — PROMETHAZINE HCL 25 MG PO TABS: 12.5000 mg | ORAL_TABLET | Freq: Four times a day (QID) | ORAL | Status: DC | PRN

## 2011-05-17 MED ORDER — PROMETHAZINE HCL 25 MG/ML IJ SOLN
12.5000 mg | Freq: Four times a day (QID) | INTRAMUSCULAR | Status: DC | PRN
  Administered 2011-05-17: 12.5 mg via INTRAVENOUS
  Filled 2011-05-17: qty 1

## 2011-05-17 MED ORDER — ONDANSETRON HCL 4 MG/2ML IJ SOLN
4.0000 mg | Freq: Once | INTRAMUSCULAR | Status: AC
  Administered 2011-05-17: 4 mg via INTRAVENOUS
  Filled 2011-05-17: qty 2

## 2011-05-17 MED ORDER — ONDANSETRON HCL 4 MG/2ML IJ SOLN
8.0000 mg | Freq: Once | INTRAMUSCULAR | Status: AC
  Administered 2011-05-17: 8 mg via INTRAVENOUS
  Filled 2011-05-17: qty 4

## 2011-05-17 MED ORDER — PROMETHAZINE HCL 25 MG RE SUPP: 25.0000 mg | Freq: Four times a day (QID) | RECTAL | Status: DC | PRN

## 2011-05-17 MED ORDER — MORPHINE SULFATE 4 MG/ML IJ SOLN
6.0000 mg | Freq: Once | INTRAMUSCULAR | Status: AC
  Administered 2011-05-17: 4 mg via INTRAVENOUS
  Filled 2011-05-17: qty 1

## 2011-05-17 MED ORDER — SODIUM CHLORIDE 0.9 % IV BOLUS (SEPSIS)
1000.0000 mL | Freq: Once | INTRAVENOUS | Status: AC
  Administered 2011-05-17: 1000 mL via INTRAVENOUS

## 2011-05-17 MED ORDER — IOHEXOL 300 MG/ML  SOLN
100.0000 mL | Freq: Once | INTRAMUSCULAR | Status: AC | PRN
  Administered 2011-05-17: 100 mL via INTRAVENOUS

## 2011-05-17 NOTE — ED Notes (Signed)
Pt to CT

## 2011-05-17 NOTE — ED Notes (Addendum)
Pt to department via EMS.  Reports nausea and vomiting began at midnight.  Pt presently nauseated, vomited small amount of bile colored emesis. Pt reporting sharp epigastric pain.

## 2011-05-17 NOTE — Discharge Instructions (Signed)
Drink lots of fluids. Bland diet for the next 6-8 hours then progress as tolerated. Use the nausea medicine as needed. Use BRAT for diarrhea.

## 2011-05-17 NOTE — ED Notes (Signed)
Pt Dc to home via wheelchair

## 2011-05-17 NOTE — ED Notes (Signed)
Pt  Reports nausea coming back, notified edp

## 2011-05-17 NOTE — ED Provider Notes (Signed)
History    71 year old female with nausea vomiting.. Onset around midnight tonight. Patient went to bed in her usual state of health. Woke up vomiting. Upper abdominal pain. Describes this pain as achy with occasional sharper pain. Does not radiate. No appreciable exacerbating relieving factors. No fevers or chills. No urinary complaints. No sick contacts. Surgical history significant for hysterectomy but denies any recent procedures. No chest pain or shortness of breath.   CSN: [id number]  Arrival date & time 05/17/11  0606   First MD Initiated Contact with Patient 05/17/11 [phone]      Chief Complaint  Patient presents with  . Nausea  . Emesis    (Consider location/radiation/quality/duration/timing/severity/associated sxs/prior treatment) HPI  Past Medical History  Diagnosis Date  . COPD (chronic obstructive pulmonary disease)   . Back pain, chronic   . Anxiety   . Hypertension   . Arthritis   . Shortness of breath     Past Surgical History  Procedure Date  . Hysterotomy   . Tubal ligation   . Abdominal hysterectomy   . Knee arthroscopy     Family History  Problem Relation Age of Onset  . Breast cancer Sister     History  Substance Use Topics  . Smoking status: Former Smoker -- 2.0 packs/day for 40 years  . Smokeless tobacco: Not on file  . Alcohol Use: No    OB History    Grav Para Term Preterm Abortions TAB SAB Ect Mult Living                  Review of Systems   Review of symptoms negative unless otherwise noted in HPI.   Allergies  Other and Cyclobenzaprine hcl  Home Medications   Current Outpatient Rx  Name Route Sig Dispense Refill  . ALBUTEROL SULFATE HFA 108 (90 BASE) MCG/ACT IN AERS Inhalation Inhale 2 puffs into the lungs every 6 (six) hours as needed. For COPD     . ALPRAZOLAM 0.5 MG PO TABS Oral Take 0.5 mg by mouth every 8 (eight) hours as needed. For anxiety     . QVAR IN Inhalation Inhale 1-2 puffs into the lungs daily.      Marland Kitchen CALCIUM  600 + D PO Oral Take 1 tablet by mouth daily.      Marland Kitchen VITAMIN D3 2000 UNITS PO CAPS Oral Take 2,000 Units by mouth daily.      Marland Kitchen DIPHENHYDRAMINE HCL 25 MG PO TABS Oral Take 25 mg by mouth at bedtime as needed. For allergies     . ESOMEPRAZOLE MAGNESIUM 40 MG PO CPDR Oral Take 40 mg by mouth daily before breakfast.      . HYDROCODONE-HOMATROPINE 5-1.5 MG/5ML PO SYRP Oral Take 5 mLs by mouth every 6 (six) hours as needed. For cough     . OXYCODONE-ACETAMINOPHEN 5-325 MG PO TABS Oral Take 1 tablet by mouth daily as needed. For pain     . TELMISARTAN-HCTZ 80-12.5 MG PO TABS Oral Take 1 tablet by mouth daily.        BP 136/75  Pulse 61  Temp(Src) 98.7 F (37.1 C) (Oral)  Resp 20  Ht 5' (1.524 m)  Wt 180 lb (81.647 kg)  BMI 35.15 kg/m2  SpO2 98%  Physical Exam  Nursing note and vitals reviewed. Constitutional:       Laying in bed. No acute distress. Obese.  HENT:  Head: Normocephalic and atraumatic.  Eyes: Conjunctivae are normal. Pupils are equal, round, and reactive to light.  Right eye exhibits no discharge. Left eye exhibits no discharge.  Neck: Neck supple.  Cardiovascular: Normal rate, regular rhythm and normal heart sounds.  Exam reveals no gallop and no friction rub.   No murmur heard. Pulmonary/Chest: Effort normal. No respiratory distress. She has wheezes.       Occasional expiratory wheeze  Abdominal: Soft. She exhibits no distension. There is tenderness.       Mild tenderness in the epigastrium. No guarding or rebound. No mass. No distention. Hyperactive bowel sounds  Genitourinary:       No costovertebral angle tenderness.  Musculoskeletal: She exhibits no edema and no tenderness.  Neurological: She is alert.  Skin: Skin is warm and dry. She is not diaphoretic.  Psychiatric: She has a normal mood and affect. Her behavior is normal. Thought content normal.    ED Course  Procedures (including critical care time)  Labs Reviewed  CBC - Abnormal; Notable for the  following:    Hemoglobin 11.7 (*)    MCH 24.9 (*)    RDW 15.8 (*)    All other components within normal limits  LIPASE, BLOOD - Abnormal; Notable for the following:    Lipase 110 (*)    All other components within normal limits  COMPREHENSIVE METABOLIC PANEL - Abnormal; Notable for the following:    Glucose, Bld 126 (*)    Total Bilirubin 0.2 (*)    GFR calc non Af Amer 57 (*)    GFR calc Af Amer 66 (*)    All other components within normal limits  URINALYSIS, ROUTINE W REFLEX MICROSCOPIC   No results found.   1. Abdominal pain   2. Vomiting       MDM  71 year old female with abdominal pain. Gastritis, peptic ulcer disease, biliary colic, cholelithiasis, cholecystitis, cholangitis, hepatitis, renal colic, urinary tract infection, colitis, constipation, gastroenteritis, atypical ACS, mesenteric ischemia all considered among other etiologies in the patient's differential diagnosis. Possible pancreatitis. Patient's lipase is mildly elevated. LFTs normal. Patient is still actively vomiting in emergency room. Change symptomatically at this time. CT scan of the abdomen and pelvis is pending. Disposition is pending the results of the CAT scan and continued severity of patient's symptoms.        Raeford Razor, MD 05/24/11 [phone]

## 2011-05-17 NOTE — ED Notes (Signed)
Pt actively vomiting, notified edp 

## 2011-05-17 NOTE — ED Provider Notes (Signed)
  Physical Exam  BP 102/57  Pulse 76  Temp(Src) 98.7 F (37.1 C) (Oral)  Resp 22  Ht 5' (1.524 m)  Wt 180 lb (81.647 kg)  BMI 35.15 kg/m2  SpO2 99%  Physical Exam  ED Course  Procedures  MDM [phone] Assumed care/disposition of patient. Patient here with nausea, vomiting illness associated with abdominal pain. Labs were unremarkable except for a mildly elevated wbc and lipase. CT negative for acute findings. Nausea required additional antiemetic. Relief was obtained with phenergan.     Pt feels improved after observation and/or treatment in ED.Pt stable in ED with no significant deterioration in condition.The patient appears reasonably screened and/or stabilized for discharge and I doubt any other medical condition or other [facility] requiring further screening, evaluation, or treatment in the ED at this time prior to discharge. Ct Abdomen Pelvis W Contrast  05/17/2011  *RADIOLOGY REPORT*  Clinical Data: Nausea, vomiting, sharp epigastric abdominal pain  CT ABDOMEN AND PELVIS WITH CONTRAST  Technique:  Multidetector CT imaging of the abdomen and pelvis was performed following the standard protocol during bolus administration of intravenous contrast.  Contrast: OMNIPAQUE IOHEXOL 300 MG/ML IJ SOLN  Comparison: 01/19/2008, 11/03/2007, 10/30/2007  Findings: Lung bases clear.  Normal heart size.  No pericardial or pleural effusion.  Small hiatal hernia noted.  Abdomen:  Posterior right hepatic dome hypodense lesion noted measuring 22 x 13 mm, image 17.  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  No other hepatic abnormality.  Hepatic and portal veins are patent.  Gallbladder, biliary system, pancreas, kidneys, spleen, and adrenal glands are within normal limits and demonstrate no acute process.  No abdominal free fluid, fluid collection, hemorrhage, adenopathy, or abscess.  Negative for bowel obstruction, dilatation, ileus, or free air.   Atherosclerosis of the aorta without aneurysm.  Pelvis:  Normal appendix demonstrated, retrocecal in position. Scattered minor diverticulosis of the distal colon.  Colon is collapsed.  Iliac atherosclerosis evident.  Previous hysterectomy noted.  Pelvic calcifications likely venous phleboliths.  No pelvic free fluid, fluid collection, hemorrhage, hematoma, adenopathy, or inguinal abnormality.  Urinary bladder is decompressed.  Small fat containing umbilical hernia noted, image 47.  Degenerative changes of the spine and an associate scoliosis.  IMPRESSION: No acute intra-abdominal or pelvic process.  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  Minor diverticulosis  Normal appendix  Small hiatal hernia  Degenerative changes of the spine and scoliosis  Original Report Authenticated By: Judie Petit. Ruel Favors, M.D.        Nicoletta Dress. Colon Branch, MD 05/17/11 1140

## 2011-05-27 ENCOUNTER — Other Ambulatory Visit: Payer: Self-pay

## 2011-05-27 DIAGNOSIS — J449 Chronic obstructive pulmonary disease, unspecified: Secondary | ICD-10-CM

## 2011-06-01 ENCOUNTER — Ambulatory Visit (HOSPITAL_COMMUNITY)
Admission: RE | Admit: 2011-06-01 | Discharge: 2011-06-01 | Disposition: A | Discharge status: Home / Self Care | Payer: Medicare Other | Source: Ambulatory Visit | Attending: Pulmonary Disease | Admitting: Pulmonary Disease

## 2011-06-01 DIAGNOSIS — R0602 Shortness of breath: Secondary | ICD-10-CM | POA: Insufficient documentation

## 2011-06-01 LAB — BLOOD GAS, ARTERIAL
Acid-Base Excess: 1.7 mmol/L (ref 0.0–2.0)
Bicarbonate: 25.4 mEq/L — ABNORMAL HIGH (ref 20.0–24.0)
TCO2: 22.8 mmol/L (ref 0–100)
pCO2 arterial: 37.4 mmHg (ref 35.0–45.0)
pH, Arterial: 7.448 — ABNORMAL HIGH (ref 7.350–7.400)
pO2, Arterial: 80 mmHg (ref 80.0–100.0)

## 2011-06-01 MED ORDER — ALBUTEROL SULFATE (5 MG/ML) 0.5% IN NEBU
2.5000 mg | INHALATION_SOLUTION | Freq: Once | RESPIRATORY_TRACT | Status: AC
  Administered 2011-06-01: 2.5 mg via RESPIRATORY_TRACT

## 2011-06-03 NOTE — Procedures (Signed)
NAME:  Glenda Terry, [name]:  [id number]  MEDICAL RECORD NO.:  [id number]  LOCATION:                                 FACILITY:  PHYSICIAN:  Salsabeel Gorelick L. Juanetta Gosling, M.D.DATE OF BIRTH:  [birth year]  DATE OF PROCEDURE: DATE OF DISCHARGE:                           PULMONARY FUNCTION TEST   Reason for pulmonary function testing is COPD. 1. Spirometry shows airflow obstruction at the level of the smaller     airways, but no ventilatory defect. 2. Lung volumes show some air trapping. 3. DLCO is mildly reduced. 4. Airway resistance is normal. 5. There is improvement which reaches a level of significance after     inhaled bronchodilator.     [name] L. Juanetta Gosling, M.D.     [facility]/MEDQ  D:  06/02/2011  T:  06/02/2011  Job:  [id number]

## 2011-07-27 LAB — PULMONARY FUNCTION TEST

## 2011-10-08 ENCOUNTER — Other Ambulatory Visit: Payer: Self-pay | Admitting: Orthopedic Surgery

## 2011-10-08 DIAGNOSIS — M48061 Spinal stenosis, lumbar region without neurogenic claudication: Secondary | ICD-10-CM

## 2011-10-08 DIAGNOSIS — M5126 Other intervertebral disc displacement, lumbar region: Secondary | ICD-10-CM

## 2011-10-15 ENCOUNTER — Ambulatory Visit
Admission: RE | Admit: 2011-10-15 | Discharge: 2011-10-15 | Disposition: A | Discharge status: Home / Self Care | Payer: Medicare Other | Source: Ambulatory Visit | Attending: Orthopedic Surgery | Admitting: Orthopedic Surgery

## 2011-10-15 VITALS — BP 110/48 | HR 70

## 2011-10-15 DIAGNOSIS — M5126 Other intervertebral disc displacement, lumbar region: Secondary | ICD-10-CM

## 2011-10-15 DIAGNOSIS — M48061 Spinal stenosis, lumbar region without neurogenic claudication: Secondary | ICD-10-CM

## 2011-10-15 MED ORDER — MEPERIDINE HCL 100 MG/ML IJ SOLN
75.0000 mg | Freq: Once | INTRAMUSCULAR | Status: AC
  Administered 2011-10-15: 75 mg via INTRAMUSCULAR

## 2011-10-15 MED ORDER — DIAZEPAM 5 MG PO TABS
5.0000 mg | ORAL_TABLET | Freq: Once | ORAL | Status: AC
  Administered 2011-10-15: 5 mg via ORAL

## 2011-10-15 MED ORDER — ONDANSETRON HCL 4 MG/2ML IJ SOLN
4.0000 mg | Freq: Once | INTRAMUSCULAR | Status: AC
  Administered 2011-10-15: 4 mg via INTRAMUSCULAR

## 2011-10-15 MED ORDER — ONDANSETRON HCL 4 MG/2ML IJ SOLN: 4.0000 mg | Freq: Four times a day (QID) | INTRAMUSCULAR | Status: DC | PRN

## 2011-10-15 MED ORDER — IOHEXOL 180 MG/ML  SOLN
18.0000 mL | Freq: Once | INTRAMUSCULAR | Status: AC | PRN
  Administered 2011-10-15: 18 mL via INTRATHECAL

## 2011-10-21 ENCOUNTER — Telehealth: Payer: Self-pay | Admitting: Radiology

## 2011-10-21 NOTE — Telephone Encounter (Signed)
C/o nausea all weekend, no headache, no rash or hives, no other symptoms

## 2011-12-30 ENCOUNTER — Other Ambulatory Visit: Payer: Self-pay | Admitting: Orthopedic Surgery

## 2011-12-30 MED ORDER — BUPIVACAINE 0.25 % ON-Q PUMP SINGLE CATH 300ML: 300.0000 mL | INJECTION | Status: DC

## 2011-12-30 MED ORDER — DEXAMETHASONE SODIUM PHOSPHATE 10 MG/ML IJ SOLN: 10.0000 mg | Freq: Once | INTRAMUSCULAR | Status: DC

## 2011-12-30 NOTE — Progress Notes (Signed)
Preoperative surgical orders have been place into the Epic hospital system for Glenda Terry on 12/30/2011, 10:18 AM  by Patrica Duel for surgery on 01/24/12.  Preop Total Knee orders including Bupivacaine On-Q pump, IV Tylenol, and IV Decadron as long as there are no contraindications to the above medications. Avel Peace, PA-C

## 2012-01-12 ENCOUNTER — Encounter (HOSPITAL_COMMUNITY): Payer: Self-pay | Admitting: Pharmacy Technician

## 2012-01-14 DIAGNOSIS — D509 Iron deficiency anemia, unspecified: Secondary | ICD-10-CM

## 2012-01-14 HISTORY — DX: Iron deficiency anemia, unspecified: D50.9

## 2012-01-19 ENCOUNTER — Ambulatory Visit (HOSPITAL_COMMUNITY)
Admission: RE | Admit: 2012-01-19 | Discharge: 2012-01-19 | Disposition: A | Discharge status: Home / Self Care | Payer: Medicare Other | Source: Ambulatory Visit | Attending: Orthopedic Surgery | Admitting: Orthopedic Surgery

## 2012-01-19 ENCOUNTER — Encounter (HOSPITAL_COMMUNITY): Payer: Self-pay

## 2012-01-19 ENCOUNTER — Encounter (HOSPITAL_COMMUNITY)
Admission: RE | Admit: 2012-01-19 | Discharge: 2012-01-19 | Disposition: A | Discharge status: Home / Self Care | Payer: Medicare Other | Source: Ambulatory Visit | Attending: Orthopedic Surgery | Admitting: Orthopedic Surgery

## 2012-01-19 DIAGNOSIS — J40 Bronchitis, not specified as acute or chronic: Secondary | ICD-10-CM | POA: Insufficient documentation

## 2012-01-19 HISTORY — DX: Gastro-esophageal reflux disease without esophagitis: K21.9

## 2012-01-19 LAB — URINALYSIS, ROUTINE W REFLEX MICROSCOPIC
Glucose, UA: NEGATIVE mg/dL
Hgb urine dipstick: NEGATIVE
Leukocytes, UA: NEGATIVE
Protein, ur: NEGATIVE mg/dL
Specific Gravity, Urine: 1.015 (ref 1.005–1.030)

## 2012-01-19 LAB — CBC
HCT: 34.1 % — ABNORMAL LOW (ref 36.0–46.0)
Hemoglobin: 10.5 g/dL — ABNORMAL LOW (ref 12.0–15.0)
MCH: 23.8 pg — ABNORMAL LOW (ref 26.0–34.0)
RBC: 4.42 MIL/uL (ref 3.87–5.11)

## 2012-01-19 LAB — COMPREHENSIVE METABOLIC PANEL
ALT: 42 U/L — ABNORMAL HIGH (ref 0–35)
Alkaline Phosphatase: 79 U/L (ref 39–117)
BUN: 16 mg/dL (ref 6–23)
CO2: 29 mEq/L (ref 19–32)
Calcium: 9.3 mg/dL (ref 8.4–10.5)
GFR calc Af Amer: 50 mL/min — ABNORMAL LOW (ref >90)
GFR calc non Af Amer: 43 mL/min — ABNORMAL LOW (ref >90)
Glucose, Bld: 84 mg/dL (ref 70–99)
Potassium: 4.2 mEq/L (ref 3.5–5.1)
Sodium: 133 mEq/L — ABNORMAL LOW (ref 135–145)

## 2012-01-19 LAB — SURGICAL PCR SCREEN: Staphylococcus aureus: NEGATIVE

## 2012-01-19 LAB — PROTIME-INR: Prothrombin Time: 12.1 seconds (ref 11.6–15.2)

## 2012-01-19 NOTE — Patient Instructions (Signed)
20      Your procedure is scheduled on:  Monday 01/24/2012 at 315 pm  Report to [facility] at 1245 pm  Call this number if you have problems the morning of surgery: [phone]   Remember:   Do not eat food after midnight! MAY HAVE CLEAR LIQUIDS FROM MIDNIGHT UP UNTIL 0915 AM THE AM OF SURGERY THEN NOTHING UNTIL AFTER SURGERY!  Take these medicines the morning of surgery with A SIP OF WATER: Nexium, Loratadine, use Flonase nasal spray, Use Qvar inhaler and Albuterol inhaler if needed and bring inhalers with you to hospital   Do not bring valuables to the hospital.  .  Leave suitcase in the car. After surgery it may be brought to your room.  For patients admitted to the hospital, checkout time is 11:00 AM the day of              Discharge.    Special Instructions: See [facility] Preparing  For Surgery Instruction Sheet.  Do not wear jewelry, lotions powders, perfumes. Women do not shave  legs or underarms for 12 hours before showers. Contacts, partial plates, or dentures may not be worn into surgery.                          Patients discharged the day of surgery will not be allowed to drive home.  If going home the same day of surgery, must have someone stay with you  first 24 hrs.at home and arrange for someone to drive you home from the              Hospital. YOUR DRIVER IS:   Please read over the following fact sheets that you were given: MRSA INFORMATION,INCENTIVE SPIROMETRY SHEET, SLEEP APNEA SHEET, BLOOD TRANSFUSION SHEET                            Telford Nab.Vestal Markin,RN,BSN     [phone]

## 2012-01-23 ENCOUNTER — Other Ambulatory Visit: Payer: Self-pay | Admitting: Orthopedic Surgery

## 2012-01-23 NOTE — H&P (Signed)
Glenda Terry  DOB: 15-Oct-1940 Single / Language: [place] / Race: White Female  Date of Admission:  01/24/12  Chief Complaint:  Right Knee Pain  History of Present Illness The patient is a 71 year old female who comes in for a preoperative History and Physical. The patient is scheduled for a right total knee arthroplasty to be performed by Dr. Gus Rankin. Aluisio, MD at [facility] on 01/24/2012. The patient is a 71 year old female who presents today for follow up of their knee. The patient is being followed for their right knee pain and osteoarthritis. The patient has not gotten any relief of their symptoms with Cortisone injections. She had an injection back on 09/15/11 with Avel Peace, PA-C. She said the injection only helped about 24 hours. She said it also made her nauseated. She has markedly worsening pain in that left knee. It is hurting at all times. It is limiting what she can and cannot do. She has had recurrent swelling. Unfortunately it has had a very negative effect on her lifestyle. She has now had an arthroscopy with debridement of a degenerative knee which surprisingly helped her for about 2 years. Unfortunately that effect has worn off and she is having intense pain in the knee. It is effecting all aspects of her life. She is now ready to get the knee replaced. They have been treated conservatively in the past for the above stated problem and despite conservative measures, they continue to have progressive pain and severe functional limitations and dysfunction. They have failed non-operative management including home exercise, medications, and injections. It is felt that they would benefit from undergoing total joint replacement. Risks and benefits of the procedure have been discussed with the patient and they elect to proceed with surgery. There are no active contraindications to surgery such as ongoing infection or rapidly progressive neurological  disease.   Problem List Osteoarthritis, Knee (715.96)  Allergies Flexeril *MUSCULOSKELETAL THERAPY AGENTS*   Family History Depression. sister Drug / Alcohol Addiction. father and sister Cerebrovascular Accident. grandmother mothers side Heart Disease. mother Cancer. sister and grandfather mothers side Kidney disease. mother, father, sister and grandmother mothers side Osteoarthritis. mother, father, sister, brother and grandfather mothers side Diabetes Mellitus. father and sister Hypertension. mother, father and brother   Social History Exercise. Exercises weekly; does running / walking Drug/Alcohol Rehab (Previously). no Pain Contract. no Illicit drug use. no Drug/Alcohol Rehab (Currently). no Living situation. live alone Alcohol use. never consumed alcohol Children. 3 Number of flights of stairs before winded. 1 Tobacco use. former smoker; smoke(d) 1 1/2 pack(s) per day Tobacco / smoke exposure. no Marital status. single Current work status. working part time [employer]. Patient wants to look into [facility].   Medication History Micardis HCT (80-12.5MG  Tablet, Oral) Active. VESIcare (5MG  Tablet, Oral) Active. Singulair ( Oral) Specific dose unknown - Active. NexIUM (40MG  Capsule DR, Oral) Active. ProAir HFA (108 (90 Base)MCG/ACT Aerosol Soln, Inhalation) Active. Qvar ( Inhalation) Specific dose unknown - Active. Xanax (0.5MG  Tablet, Oral) Active. Percocet (5-325MG  Tablet, Oral) Active. Vitamin D3 Complete ( Oral) Active. Loratadine (10MG  Tablet, Oral) Active.   Past Surgical History Arthroscopy of Knee. right Hysterectomy. Date: 08/1983. complete (non-cancerous) Tubal Ligation Ganglion Cyst Excision. Date: 68.  Past Medical History Osteoarthritis High blood pressure Asthma Anxiety Disorder Autoimmune disorder. brother Gastroesophageal Reflux Disease Anemia Vertigo Varicose  veins Hemorrhoids Degenerative Disc Disease Spinal Stenosis, Lumbar (724.02) Menopause   Review of Systems General:Not Present- Chills, Fever, Night Sweats, Fatigue,  Weight Gain, Weight Loss and Memory Loss. Skin:Not Present- Hives, Itching, Rash, Eczema and Lesions. HEENT:Not Present- Tinnitus, Headache, Double Vision, Visual Loss, Hearing Loss and Dentures. Respiratory:Present- Shortness of breath with exertion and Wheezing. Not Present- Shortness of breath at rest, Allergies, Coughing up blood and Chronic Cough. Cardiovascular:Not Present- Chest Pain, Racing/skipping heartbeats, Difficulty Breathing Lying Down, Murmur, Swelling and Palpitations. Gastrointestinal:Present- Constipation. Not Present- Bloody Stool, Heartburn, Abdominal Pain, Vomiting, Nausea, Diarrhea, Difficulty Swallowing, Jaundice and Loss of appetitie. Female Genitourinary:Present- Urinary frequency. Not Present- Blood in Urine, Weak urinary stream, Discharge, Flank Pain, Incontinence, Painful Urination, Urgency, Urinary Retention and Urinating at Night. Musculoskeletal:Present- Muscle Pain, Joint Swelling, Joint Pain, Back Pain and Morning Stiffness. Not Present- Muscle Weakness and Spasms. Neurological:Not Present- Tremor, Dizziness, Blackout spells, Paralysis, Difficulty with balance and Weakness. Psychiatric:Not Present- Insomnia.   Vitals Weight: 183 lb Height: 60 in Body Surface Area: 1.87 m Body Mass Index: 35.74 kg/m Pulse: 64 (Regular) Resp.: 12 (Unlabored) BP: 112/62 (Sitting, Left Arm, Standard)   Physical Exam The physical exam findings are as follows: Patient is a 71 year old female with continued knee pain.  General Mental Status - Alert, cooperative and good historian. General Appearance- pleasant. Not in acute distress. Orientation- Oriented X3. Build & Nutrition- Well nourished and Well developed.  Head and Neck Head- normocephalic, atraumatic . Neck Global  Assessment- supple. no bruit auscultated on the right and no bruit auscultated on the left.   Eye Pupil- Bilateral- Regular and Round. Motion- Bilateral- EOMI.   ENMT  upper and lower dentures  Chest and Lung Exam Auscultation: Breath sounds:- clear at anterior chest wall and - clear at posterior chest wall. Adventitious sounds:- No Adventitious sounds.   Cardiovascular Auscultation:Rhythm- Regular rate and rhythm. Heart Sounds- S1 WNL and S2 WNL. Murmurs & Other Heart Sounds:Auscultation of the heart reveals - No Murmurs.   Abdomen Inspection:Contour- Generalized moderate distention. Palpation/Percussion:Tenderness- Abdomen is non-tender to palpation. Rigidity (guarding)- Abdomen is soft. Auscultation:Auscultation of the abdomen reveals - Bowel sounds normal.   Female Genitourinary Not done, not pertinent to present illness  Musculoskeletal She is alert and oriented in no apparent distress. Her right knee shows effusion. There is no warmth about the knee. Her range of motion is about 5 to 120. There is no instability.  RADIOGRAPHS: Reviewed her x-ray, she has advanced endstage arthritis of the knee.  Assessment & Plan Osteoarthritis, Knee (715.96) Impression: Right Knee  Pateint is for a right total knee replacement.  Patient wants to look into [facility] for inpatient rehab.   Signed electronically by Roberts Gaudy, PA-C

## 2012-01-24 ENCOUNTER — Encounter (HOSPITAL_COMMUNITY): Payer: Self-pay | Admitting: Anesthesiology

## 2012-01-24 ENCOUNTER — Inpatient Hospital Stay (HOSPITAL_COMMUNITY)
Admission: RE | Admit: 2012-01-24 | Discharge: 2012-01-27 | DRG: 470 | Disposition: A | Discharge status: Skilled Nursing Facility | Payer: Medicare Other | Source: Ambulatory Visit | Attending: Orthopedic Surgery | Admitting: Orthopedic Surgery

## 2012-01-24 ENCOUNTER — Encounter (HOSPITAL_COMMUNITY): Payer: Self-pay | Admitting: *Deleted

## 2012-01-24 ENCOUNTER — Encounter (HOSPITAL_COMMUNITY)
Admission: RE | Disposition: A | Discharge status: Skilled Nursing Facility | Payer: Self-pay | Source: Ambulatory Visit | Attending: Orthopedic Surgery

## 2012-01-24 ENCOUNTER — Inpatient Hospital Stay (HOSPITAL_COMMUNITY): Payer: Medicare Other | Admitting: Anesthesiology

## 2012-01-24 DIAGNOSIS — K219 Gastro-esophageal reflux disease without esophagitis: Secondary | ICD-10-CM | POA: Diagnosis present

## 2012-01-24 DIAGNOSIS — Z96659 Presence of unspecified artificial knee joint: Secondary | ICD-10-CM

## 2012-01-24 DIAGNOSIS — I10 Essential (primary) hypertension: Secondary | ICD-10-CM | POA: Diagnosis present

## 2012-01-24 DIAGNOSIS — M1710 Unilateral primary osteoarthritis, unspecified knee: Principal | ICD-10-CM | POA: Diagnosis present

## 2012-01-24 DIAGNOSIS — J449 Chronic obstructive pulmonary disease, unspecified: Secondary | ICD-10-CM | POA: Diagnosis present

## 2012-01-24 DIAGNOSIS — F411 Generalized anxiety disorder: Secondary | ICD-10-CM | POA: Diagnosis present

## 2012-01-24 DIAGNOSIS — J4489 Other specified chronic obstructive pulmonary disease: Secondary | ICD-10-CM | POA: Diagnosis present

## 2012-01-24 DIAGNOSIS — E669 Obesity, unspecified: Secondary | ICD-10-CM | POA: Diagnosis present

## 2012-01-24 DIAGNOSIS — D62 Acute posthemorrhagic anemia: Secondary | ICD-10-CM | POA: Insufficient documentation

## 2012-01-24 DIAGNOSIS — E871 Hypo-osmolality and hyponatremia: Secondary | ICD-10-CM

## 2012-01-24 HISTORY — PX: TOTAL KNEE ARTHROPLASTY: SHX125

## 2012-01-24 LAB — ABO/RH: ABO/RH(D): B POS

## 2012-01-24 LAB — TYPE AND SCREEN
ABO/RH(D): B POS
Antibody Screen: NEGATIVE

## 2012-01-24 SURGERY — ARTHROPLASTY, KNEE, TOTAL
Anesthesia: Spinal | Site: Knee | Laterality: Right | Wound class: Clean

## 2012-01-24 MED ORDER — MORPHINE SULFATE 2 MG/ML IJ SOLN
1.0000 mg | INTRAMUSCULAR | Status: DC | PRN
  Administered 2012-01-24: 1 mg via INTRAVENOUS
  Administered 2012-01-24: 2 mg via INTRAVENOUS
  Administered 2012-01-24: 1 mg via INTRAVENOUS
  Administered 2012-01-25 (×3): 2 mg via INTRAVENOUS
  Filled 2012-01-24 (×6): qty 1

## 2012-01-24 MED ORDER — MONTELUKAST SODIUM 10 MG PO TABS
10.0000 mg | ORAL_TABLET | Freq: Every day | ORAL | Status: DC
  Administered 2012-01-24 – 2012-01-26 (×3): 10 mg via ORAL
  Filled 2012-01-24 (×4): qty 1

## 2012-01-24 MED ORDER — BUPIVACAINE IN DEXTROSE 0.75-8.25 % IT SOLN
INTRATHECAL | Status: DC | PRN
  Administered 2012-01-24: 1.8 mL via INTRATHECAL

## 2012-01-24 MED ORDER — DOCUSATE SODIUM 100 MG PO CAPS
100.0000 mg | ORAL_CAPSULE | Freq: Two times a day (BID) | ORAL | Status: DC
  Administered 2012-01-24 – 2012-01-27 (×6): 100 mg via ORAL

## 2012-01-24 MED ORDER — MEPERIDINE HCL 50 MG/ML IJ SOLN: 6.2500 mg | INTRAMUSCULAR | Status: DC | PRN

## 2012-01-24 MED ORDER — DARIFENACIN HYDROBROMIDE ER 7.5 MG PO TB24
7.5000 mg | ORAL_TABLET | Freq: Every day | ORAL | Status: DC
  Administered 2012-01-24 – 2012-01-27 (×4): 7.5 mg via ORAL
  Filled 2012-01-24 (×4): qty 1

## 2012-01-24 MED ORDER — METOCLOPRAMIDE HCL 5 MG/ML IJ SOLN
5.0000 mg | Freq: Three times a day (TID) | INTRAMUSCULAR | Status: DC | PRN
  Administered 2012-01-24: 10 mg via INTRAVENOUS
  Filled 2012-01-24: qty 2

## 2012-01-24 MED ORDER — FLUTICASONE PROPIONATE HFA 44 MCG/ACT IN AERO
2.0000 | INHALATION_SPRAY | Freq: Two times a day (BID) | RESPIRATORY_TRACT | Status: DC
  Administered 2012-01-24 – 2012-01-27 (×6): 2 via RESPIRATORY_TRACT
  Filled 2012-01-24: qty 10.6

## 2012-01-24 MED ORDER — MENTHOL 3 MG MT LOZG
1.0000 | LOZENGE | OROMUCOSAL | Status: DC | PRN
  Filled 2012-01-24: qty 9

## 2012-01-24 MED ORDER — SODIUM CHLORIDE 0.9 % IR SOLN
Status: DC | PRN
  Administered 2012-01-24: 1000 mL

## 2012-01-24 MED ORDER — ONDANSETRON HCL 4 MG PO TABS
4.0000 mg | ORAL_TABLET | Freq: Four times a day (QID) | ORAL | Status: DC | PRN
  Administered 2012-01-27: 4 mg via ORAL
  Filled 2012-01-24: qty 1

## 2012-01-24 MED ORDER — BUPIVACAINE ON-Q PAIN PUMP (FOR ORDER SET NO CHG)
INJECTION | Status: DC
  Filled 2012-01-24: qty 1

## 2012-01-24 MED ORDER — ACETAMINOPHEN 10 MG/ML IV SOLN: 1000.0000 mg | Freq: Once | INTRAVENOUS | Status: DC

## 2012-01-24 MED ORDER — FLEET ENEMA 7-19 GM/118ML RE ENEM: 1.0000 | ENEMA | Freq: Once | RECTAL | Status: AC | PRN

## 2012-01-24 MED ORDER — ONDANSETRON HCL 4 MG/2ML IJ SOLN
INTRAMUSCULAR | Status: DC | PRN
  Administered 2012-01-24: 4 mg via INTRAVENOUS

## 2012-01-24 MED ORDER — HYDROMORPHONE HCL PF 1 MG/ML IJ SOLN: 0.2500 mg | INTRAMUSCULAR | Status: DC | PRN

## 2012-01-24 MED ORDER — PROMETHAZINE HCL 25 MG/ML IJ SOLN: 6.2500 mg | INTRAMUSCULAR | Status: DC | PRN

## 2012-01-24 MED ORDER — ALBUTEROL SULFATE (5 MG/ML) 0.5% IN NEBU
2.5000 mg | INHALATION_SOLUTION | Freq: Once | RESPIRATORY_TRACT | Status: AC
  Administered 2012-01-24: 2.5 mg via RESPIRATORY_TRACT

## 2012-01-24 MED ORDER — ONDANSETRON HCL 4 MG/2ML IJ SOLN: 4.0000 mg | Freq: Four times a day (QID) | INTRAMUSCULAR | Status: DC | PRN

## 2012-01-24 MED ORDER — LORATADINE 10 MG PO TABS
10.0000 mg | ORAL_TABLET | Freq: Every day | ORAL | Status: DC
  Administered 2012-01-25 – 2012-01-27 (×3): 10 mg via ORAL
  Filled 2012-01-24 (×3): qty 1

## 2012-01-24 MED ORDER — HYDROCHLOROTHIAZIDE 12.5 MG PO CAPS
12.5000 mg | ORAL_CAPSULE | Freq: Every day | ORAL | Status: DC
  Administered 2012-01-25 – 2012-01-27 (×3): 12.5 mg via ORAL
  Filled 2012-01-24 (×3): qty 1

## 2012-01-24 MED ORDER — IRBESARTAN 300 MG PO TABS
300.0000 mg | ORAL_TABLET | Freq: Every day | ORAL | Status: DC
  Administered 2012-01-25 – 2012-01-27 (×3): 300 mg via ORAL
  Filled 2012-01-24 (×3): qty 1

## 2012-01-24 MED ORDER — POLYETHYLENE GLYCOL 3350 17 G PO PACK: 17.0000 g | PACK | Freq: Every day | ORAL | Status: DC | PRN

## 2012-01-24 MED ORDER — ACETAMINOPHEN 10 MG/ML IV SOLN: 1000.0000 mg | Freq: Once | INTRAVENOUS | Status: DC | PRN

## 2012-01-24 MED ORDER — RIVAROXABAN 10 MG PO TABS
10.0000 mg | ORAL_TABLET | Freq: Every day | ORAL | Status: DC
  Administered 2012-01-25 – 2012-01-27 (×3): 10 mg via ORAL
  Filled 2012-01-24 (×4): qty 1

## 2012-01-24 MED ORDER — CEFAZOLIN SODIUM-DEXTROSE 2-3 GM-% IV SOLR
2.0000 g | INTRAVENOUS | Status: AC
  Administered 2012-01-24: 2 g via INTRAVENOUS

## 2012-01-24 MED ORDER — MIDAZOLAM HCL 5 MG/5ML IJ SOLN
INTRAMUSCULAR | Status: DC | PRN
  Administered 2012-01-24 (×2): 1 mg via INTRAVENOUS

## 2012-01-24 MED ORDER — FLUTICASONE PROPIONATE 50 MCG/ACT NA SUSP
2.0000 | Freq: Every day | NASAL | Status: DC
  Administered 2012-01-25 – 2012-01-27 (×3): 2 via NASAL
  Filled 2012-01-24: qty 16

## 2012-01-24 MED ORDER — PANTOPRAZOLE SODIUM 40 MG PO TBEC
80.0000 mg | DELAYED_RELEASE_TABLET | Freq: Every day | ORAL | Status: DC
  Filled 2012-01-24: qty 2

## 2012-01-24 MED ORDER — BISACODYL 10 MG RE SUPP: 10.0000 mg | Freq: Every day | RECTAL | Status: DC | PRN

## 2012-01-24 MED ORDER — METHOCARBAMOL 100 MG/ML IJ SOLN
500.0000 mg | Freq: Four times a day (QID) | INTRAVENOUS | Status: DC | PRN
  Administered 2012-01-24: 500 mg via INTRAVENOUS
  Filled 2012-01-24: qty 5

## 2012-01-24 MED ORDER — METOCLOPRAMIDE HCL 10 MG PO TABS
5.0000 mg | ORAL_TABLET | Freq: Three times a day (TID) | ORAL | Status: DC | PRN
  Administered 2012-01-25 (×2): 10 mg via ORAL
  Filled 2012-01-24: qty 2

## 2012-01-24 MED ORDER — PROPOFOL 10 MG/ML IV EMUL
INTRAVENOUS | Status: DC | PRN
  Administered 2012-01-24: 75 ug/kg/min via INTRAVENOUS

## 2012-01-24 MED ORDER — DIPHENHYDRAMINE HCL 12.5 MG/5ML PO ELIX: 12.5000 mg | ORAL_SOLUTION | ORAL | Status: DC | PRN

## 2012-01-24 MED ORDER — ALPRAZOLAM 0.5 MG PO TABS
0.5000 mg | ORAL_TABLET | Freq: Three times a day (TID) | ORAL | Status: DC | PRN
  Administered 2012-01-24 – 2012-01-26 (×3): 0.5 mg via ORAL
  Filled 2012-01-24 (×3): qty 1

## 2012-01-24 MED ORDER — ALBUTEROL SULFATE HFA 108 (90 BASE) MCG/ACT IN AERS
2.0000 | INHALATION_SPRAY | Freq: Four times a day (QID) | RESPIRATORY_TRACT | Status: DC | PRN
  Administered 2012-01-27: 2 via RESPIRATORY_TRACT
  Filled 2012-01-24: qty 6.7

## 2012-01-24 MED ORDER — TELMISARTAN-HCTZ 80-12.5 MG PO TABS: 1.0000 | ORAL_TABLET | Freq: Every morning | ORAL | Status: DC

## 2012-01-24 MED ORDER — BUPIVACAINE 0.25 % ON-Q PUMP SINGLE CATH 300ML
INJECTION | Status: DC | PRN
  Administered 2012-01-24: 300 mL

## 2012-01-24 MED ORDER — OXYCODONE HCL 5 MG PO TABS: 5.0000 mg | ORAL_TABLET | Freq: Once | ORAL | Status: DC | PRN

## 2012-01-24 MED ORDER — PHENOL 1.4 % MT LIQD
1.0000 | OROMUCOSAL | Status: DC | PRN
  Filled 2012-01-24: qty 177

## 2012-01-24 MED ORDER — 0.9 % SODIUM CHLORIDE (POUR BTL) OPTIME
TOPICAL | Status: DC | PRN
  Administered 2012-01-24: 1000 mL

## 2012-01-24 MED ORDER — TRAMADOL HCL 50 MG PO TABS
50.0000 mg | ORAL_TABLET | Freq: Four times a day (QID) | ORAL | Status: DC | PRN
  Administered 2012-01-26: 50 mg via ORAL
  Administered 2012-01-27: 100 mg via ORAL
  Filled 2012-01-24: qty 1
  Filled 2012-01-24: qty 2

## 2012-01-24 MED ORDER — LACTATED RINGERS IV SOLN
INTRAVENOUS | Status: DC | PRN
  Administered 2012-01-24 (×2): via INTRAVENOUS

## 2012-01-24 MED ORDER — ACETAMINOPHEN 650 MG RE SUPP: 650.0000 mg | Freq: Four times a day (QID) | RECTAL | Status: DC | PRN

## 2012-01-24 MED ORDER — CEFAZOLIN SODIUM 1-5 GM-% IV SOLN
1.0000 g | Freq: Four times a day (QID) | INTRAVENOUS | Status: AC
  Administered 2012-01-24 – 2012-01-25 (×2): 1 g via INTRAVENOUS
  Filled 2012-01-24 (×2): qty 50

## 2012-01-24 MED ORDER — ACETAMINOPHEN 325 MG PO TABS
650.0000 mg | ORAL_TABLET | Freq: Four times a day (QID) | ORAL | Status: DC | PRN
  Administered 2012-01-26 – 2012-01-27 (×3): 650 mg via ORAL
  Filled 2012-01-24 (×4): qty 2

## 2012-01-24 MED ORDER — SODIUM CHLORIDE 0.9 % IV SOLN
INTRAVENOUS | Status: DC
  Administered 2012-01-24: 19:00:00 via INTRAVENOUS
  Administered 2012-01-25: 1000 mL via INTRAVENOUS

## 2012-01-24 MED ORDER — OXYCODONE HCL 5 MG PO TABS
5.0000 mg | ORAL_TABLET | ORAL | Status: DC | PRN
  Administered 2012-01-24 – 2012-01-25 (×3): 5 mg via ORAL
  Administered 2012-01-25 (×2): 10 mg via ORAL
  Administered 2012-01-25: 5 mg via ORAL
  Administered 2012-01-25 (×2): 10 mg via ORAL
  Administered 2012-01-26 (×4): 5 mg via ORAL
  Administered 2012-01-27: 10 mg via ORAL
  Filled 2012-01-24: qty 1
  Filled 2012-01-24: qty 2
  Filled 2012-01-24: qty 1
  Filled 2012-01-24: qty 2
  Filled 2012-01-24 (×3): qty 1
  Filled 2012-01-24 (×3): qty 2
  Filled 2012-01-24 (×2): qty 1
  Filled 2012-01-24: qty 2
  Filled 2012-01-24: qty 1

## 2012-01-24 MED ORDER — METHOCARBAMOL 500 MG PO TABS
500.0000 mg | ORAL_TABLET | Freq: Four times a day (QID) | ORAL | Status: DC | PRN
  Administered 2012-01-25 – 2012-01-27 (×6): 500 mg via ORAL
  Filled 2012-01-24 (×7): qty 1

## 2012-01-24 MED ORDER — FENTANYL CITRATE 0.05 MG/ML IJ SOLN
INTRAMUSCULAR | Status: DC | PRN
  Administered 2012-01-24 (×2): 50 ug via INTRAVENOUS

## 2012-01-24 MED ORDER — OXYCODONE HCL 5 MG/5ML PO SOLN
5.0000 mg | Freq: Once | ORAL | Status: DC | PRN
  Filled 2012-01-24: qty 5

## 2012-01-24 MED ORDER — SODIUM CHLORIDE 0.9 % IV SOLN: INTRAVENOUS | Status: DC

## 2012-01-24 SURGICAL SUPPLY — 53 items
BAG SPEC THK2 15X12 ZIP CLS (MISCELLANEOUS) ×1
BAG ZIPLOCK 12X15 (MISCELLANEOUS) ×2 IMPLANT
BANDAGE ELASTIC 6 VELCRO ST LF (GAUZE/BANDAGES/DRESSINGS) ×2 IMPLANT
BANDAGE ESMARK 6X9 LF (GAUZE/BANDAGES/DRESSINGS) ×1 IMPLANT
BLADE SAG 18X100X1.27 (BLADE) ×2 IMPLANT
BLADE SAW SGTL 11.0X1.19X90.0M (BLADE) ×2 IMPLANT
BNDG CMPR 9X6 STRL LF SNTH (GAUZE/BANDAGES/DRESSINGS) ×1
BNDG ESMARK 6X9 LF (GAUZE/BANDAGES/DRESSINGS) ×2
BOWL SMART MIX CTS (DISPOSABLE) ×2 IMPLANT
CATH KIT ON-Q SILVERSOAK 5 (CATHETERS) ×1 IMPLANT
CATH KIT ON-Q SILVERSOAK 5IN (CATHETERS) ×2 IMPLANT
CEMENT HV SMART SET (Cement) ×4 IMPLANT
CLOTH BEACON ORANGE TIMEOUT ST (SAFETY) ×2 IMPLANT
CUFF TOURN SGL QUICK 34 (TOURNIQUET CUFF) ×2
CUFF TRNQT CYL 34X4X40X1 (TOURNIQUET CUFF) ×1 IMPLANT
DRAPE EXTREMITY T 121X128X90 (DRAPE) ×2 IMPLANT
DRAPE POUCH INSTRU U-SHP 10X18 (DRAPES) ×2 IMPLANT
DRAPE U-SHAPE 47X51 STRL (DRAPES) ×2 IMPLANT
DRSG ADAPTIC 3X8 NADH LF (GAUZE/BANDAGES/DRESSINGS) ×2 IMPLANT
DURAPREP 26ML APPLICATOR (WOUND CARE) ×2 IMPLANT
ELECT REM PT RETURN 9FT ADLT (ELECTROSURGICAL) ×2
ELECTRODE REM PT RTRN 9FT ADLT (ELECTROSURGICAL) ×1 IMPLANT
EVACUATOR 1/8 PVC DRAIN (DRAIN) ×2 IMPLANT
FACESHIELD LNG OPTICON STERILE (SAFETY) ×10 IMPLANT
GLOVE BIO SURGEON STRL SZ8 (GLOVE) ×2 IMPLANT
GLOVE BIOGEL PI IND STRL 8 (GLOVE) ×2 IMPLANT
GLOVE BIOGEL PI INDICATOR 8 (GLOVE) ×2
GLOVE ECLIPSE 8.0 STRL XLNG CF (GLOVE) ×2 IMPLANT
GLOVE SURG SS PI 6.5 STRL IVOR (GLOVE) ×4 IMPLANT
GOWN STRL NON-REIN LRG LVL3 (GOWN DISPOSABLE) ×4 IMPLANT
GOWN STRL REIN XL XLG (GOWN DISPOSABLE) ×2 IMPLANT
HANDPIECE INTERPULSE COAX TIP (DISPOSABLE) ×2
IMMOBILIZER KNEE 20 (SOFTGOODS) ×2
IMMOBILIZER KNEE 20 THIGH 36 (SOFTGOODS) ×1 IMPLANT
KIT BASIN OR (CUSTOM PROCEDURE TRAY) ×2 IMPLANT
MANIFOLD NEPTUNE II (INSTRUMENTS) ×2 IMPLANT
NS IRRIG 1000ML POUR BTL (IV SOLUTION) ×2 IMPLANT
PACK TOTAL JOINT (CUSTOM PROCEDURE TRAY) ×2 IMPLANT
PAD ABD 7.5X8 STRL (GAUZE/BANDAGES/DRESSINGS) ×2 IMPLANT
PADDING CAST COTTON 6X4 STRL (CAST SUPPLIES) ×6 IMPLANT
POSITIONER SURGICAL ARM (MISCELLANEOUS) ×2 IMPLANT
SET HNDPC FAN SPRY TIP SCT (DISPOSABLE) ×1 IMPLANT
SPONGE GAUZE 4X4 12PLY (GAUZE/BANDAGES/DRESSINGS) ×2 IMPLANT
STRIP CLOSURE SKIN 1/2X4 (GAUZE/BANDAGES/DRESSINGS) ×4 IMPLANT
SUCTION FRAZIER 12FR DISP (SUCTIONS) ×2 IMPLANT
SUT MNCRL AB 4-0 PS2 18 (SUTURE) ×2 IMPLANT
SUT VIC AB 2-0 CT1 27 (SUTURE) ×6
SUT VIC AB 2-0 CT1 TAPERPNT 27 (SUTURE) ×3 IMPLANT
SUT VLOC 180 0 24IN GS25 (SUTURE) ×2 IMPLANT
TOWEL OR 17X26 10 PK STRL BLUE (TOWEL DISPOSABLE) ×4 IMPLANT
TRAY FOLEY CATH 14FRSI W/METER (CATHETERS) ×2 IMPLANT
WATER STERILE IRR 1500ML POUR (IV SOLUTION) ×2 IMPLANT
WRAP KNEE MAXI GEL POST OP (GAUZE/BANDAGES/DRESSINGS) ×4 IMPLANT

## 2012-01-24 NOTE — H&P (View-Only) (Signed)
Glenda Terry  DOB: 03/06/1941 Single / Language: English / Race: White Female  Date of Admission:  01/24/12  Chief Complaint:  Right Knee Pain  History of Present Illness The patient is a 71 year old female who comes in for a preoperative History and Physical. The patient is scheduled for a right total knee arthroplasty to be performed by Dr. Frank V. Aluisio, MD at [facility] on 01/24/2012. The patient is a 71 year old female who presents today for follow up of their knee. The patient is being followed for their right knee pain and osteoarthritis. The patient has not gotten any relief of their symptoms with Cortisone injections. She had an injection back on 09/15/11 with Drew Kendrea Cerritos, PA-C. She said the injection only helped about 24 hours. She said it also made her nauseated. She has markedly worsening pain in that left knee. It is hurting at all times. It is limiting what she can and cannot do. She has had recurrent swelling. Unfortunately it has had a very negative effect on her lifestyle. She has now had an arthroscopy with debridement of a degenerative knee which surprisingly helped her for about 2 years. Unfortunately that effect has worn off and she is having intense pain in the knee. It is effecting all aspects of her life. She is now ready to get the knee replaced. They have been treated conservatively in the past for the above stated problem and despite conservative measures, they continue to have progressive pain and severe functional limitations and dysfunction. They have failed non-operative management including home exercise, medications, and injections. It is felt that they would benefit from undergoing total joint replacement. Risks and benefits of the procedure have been discussed with the patient and they elect to proceed with surgery. There are no active contraindications to surgery such as ongoing infection or rapidly progressive neurological  disease.   Problem List Osteoarthritis, Knee (715.96)  Allergies Flexeril *MUSCULOSKELETAL THERAPY AGENTS*   Family History Depression. sister Drug / Alcohol Addiction. father and sister Cerebrovascular Accident. grandmother mothers side Heart Disease. mother Cancer. sister and grandfather mothers side Kidney disease. mother, father, sister and grandmother mothers side Osteoarthritis. mother, father, sister, brother and grandfather mothers side Diabetes Mellitus. father and sister Hypertension. mother, father and brother   Social History Exercise. Exercises weekly; does running / walking Drug/Alcohol Rehab (Previously). no Pain Contract. no Illicit drug use. no Drug/Alcohol Rehab (Currently). no Living situation. live alone Alcohol use. never consumed alcohol Children. 3 Number of flights of stairs before winded. 1 Tobacco use. former smoker; smoke(d) 1 1/2 pack(s) per day Tobacco / smoke exposure. no Marital status. single Current work status. working part time [employer]. Patient wants to look into [facility].   Medication History Micardis HCT (80-12.5MG Tablet, Oral) Active. VESIcare (5MG Tablet, Oral) Active. Singulair ( Oral) Specific dose unknown - Active. NexIUM (40MG Capsule DR, Oral) Active. ProAir HFA (108 (90 Base)MCG/ACT Aerosol Soln, Inhalation) Active. Qvar ( Inhalation) Specific dose unknown - Active. Xanax (0.5MG Tablet, Oral) Active. Percocet (5-325MG Tablet, Oral) Active. Vitamin D3 Complete ( Oral) Active. Loratadine (10MG Tablet, Oral) Active.   Past Surgical History Arthroscopy of Knee. right Hysterectomy. Date: 08/1983. complete (non-cancerous) Tubal Ligation Ganglion Cyst Excision. Date: 1970.  Past Medical History Osteoarthritis High blood pressure Asthma Anxiety Disorder Autoimmune disorder. brother Gastroesophageal Reflux Disease Anemia Vertigo Varicose  veins Hemorrhoids Degenerative Disc Disease Spinal Stenosis, Lumbar (724.02) Menopause   Review of Systems General:Not Present- Chills, Fever, Night Sweats, Fatigue,   Weight Gain, Weight Loss and Memory Loss. Skin:Not Present- Hives, Itching, Rash, Eczema and Lesions. HEENT:Not Present- Tinnitus, Headache, Double Vision, Visual Loss, Hearing Loss and Dentures. Respiratory:Present- Shortness of breath with exertion and Wheezing. Not Present- Shortness of breath at rest, Allergies, Coughing up blood and Chronic Cough. Cardiovascular:Not Present- Chest Pain, Racing/skipping heartbeats, Difficulty Breathing Lying Down, Murmur, Swelling and Palpitations. Gastrointestinal:Present- Constipation. Not Present- Bloody Stool, Heartburn, Abdominal Pain, Vomiting, Nausea, Diarrhea, Difficulty Swallowing, Jaundice and Loss of appetitie. Female Genitourinary:Present- Urinary frequency. Not Present- Blood in Urine, Weak urinary stream, Discharge, Flank Pain, Incontinence, Painful Urination, Urgency, Urinary Retention and Urinating at Night. Musculoskeletal:Present- Muscle Pain, Joint Swelling, Joint Pain, Back Pain and Morning Stiffness. Not Present- Muscle Weakness and Spasms. Neurological:Not Present- Tremor, Dizziness, Blackout spells, Paralysis, Difficulty with balance and Weakness. Psychiatric:Not Present- Insomnia.   Vitals Weight: 183 lb Height: 60 in Body Surface Area: 1.87 m Body Mass Index: 35.74 kg/m Pulse: 64 (Regular) Resp.: 12 (Unlabored) BP: 112/62 (Sitting, Left Arm, Standard)   Physical Exam The physical exam findings are as follows: Patient is a 71 year old female with continued knee pain.  General Mental Status - Alert, cooperative and good historian. General Appearance- pleasant. Not in acute distress. Orientation- Oriented X3. Build & Nutrition- Well nourished and Well developed.  Head and Neck Head- normocephalic, atraumatic . Neck Global  Assessment- supple. no bruit auscultated on the right and no bruit auscultated on the left.   Eye Pupil- Bilateral- Regular and Round. Motion- Bilateral- EOMI.   ENMT  upper and lower dentures  Chest and Lung Exam Auscultation: Breath sounds:- clear at anterior chest wall and - clear at posterior chest wall. Adventitious sounds:- No Adventitious sounds.   Cardiovascular Auscultation:Rhythm- Regular rate and rhythm. Heart Sounds- S1 WNL and S2 WNL. Murmurs & Other Heart Sounds:Auscultation of the heart reveals - No Murmurs.   Abdomen Inspection:Contour- Generalized moderate distention. Palpation/Percussion:Tenderness- Abdomen is non-tender to palpation. Rigidity (guarding)- Abdomen is soft. Auscultation:Auscultation of the abdomen reveals - Bowel sounds normal.   Female Genitourinary Not done, not pertinent to present illness  Musculoskeletal She is alert and oriented in no apparent distress. Her right knee shows effusion. There is no warmth about the knee. Her range of motion is about 5 to 120. There is no instability.  RADIOGRAPHS: Reviewed her x-ray, she has advanced endstage arthritis of the knee.  Assessment & Plan Osteoarthritis, Knee (715.96) Impression: Right Knee  Pateint is for a right total knee replacement.  Patient wants to look into [facility] for inpatient rehab.   Signed electronically by DREW L Enriqueta Augusta, PA-C 

## 2012-01-24 NOTE — Op Note (Signed)
Pre-operative diagnosis- Osteoarthritis  Right knee(s)  Post-operative diagnosis- Osteoarthritis Right knee(s)  Procedure-  Right Total Knee Arthroplasty  Surgeon- Gus Rankin. Nare Gaspari, MD  Assistant- Dimitri Ped, PA-C   Anesthesia-  Spinal EBL-* No blood loss amount entered *  Drains Hemovac  Tourniquet time-  Total Tourniquet Time Documented: Thigh (Right) - 33 minutes   Complications- None  Condition-PACU - hemodynamically stable.   Brief Clinical Note  Glenda Terry is a 71 y.o. year old female with end stage OA of her right knee with progressively worsening pain and dysfunction. She has constant pain, with activity and at rest and significant functional deficits with difficulties even with ADLs. She has had extensive non-op management including analgesics, injections of cortisone and viscosupplements, and home exercise program, but remains in significant pain with significant dysfunction.Radiographs show bone on bone arthritis all 3 compartments. She presents now for right Total Knee Arthroplasty.    Procedure in detail---   The patient is brought into the operating room and positioned supine on the operating table. After successful administration of  Spinal,   a tourniquet is placed high on the  Right thigh(s) and the lower extremity is prepped and draped in the usual sterile fashion. Time out is performed by the operating team and then the  Right lower extremity is wrapped in Esmarch, knee flexed and the tourniquet inflated to 300 mmHg.       A midline incision is made with a ten blade through the subcutaneous tissue to the level of the extensor mechanism. A fresh blade is used to make a medial parapatellar arthrotomy. Soft tissue over the proximal medial tibia is subperiosteally elevated to the joint line with a knife and into the semimembranosus bursa with a Cobb elevator. Soft tissue over the proximal lateral tibia is elevated with attention being paid to avoiding the  patellar tendon on the tibial tubercle. The patella is everted, knee flexed 90 degrees and the ACL and PCL are removed. Findings are bone on bone all 3 compartments with large global osteophytes.        The drill is used to create a starting hole in the distal femur and the canal is thoroughly irrigated with sterile saline to remove the fatty contents. The 5 degree Right  valgus alignment guide is placed into the femoral canal and the distal femoral cutting block is pinned to remove 11 mm off the distal femur. Resection is made with an oscillating saw.      The tibia is subluxed forward and the menisci are removed. The extramedullary alignment guide is placed referencing proximally at the medial aspect of the tibial tubercle and distally along the second metatarsal axis and tibial crest. The block is pinned to remove 2mm off the more deficient medial  side. Resection is made with an oscillating saw. Size 2.5is the most appropriate size for the tibia and the proximal tibia is prepared with the modular drill and keel punch for that size.      The femoral sizing guide is placed and size 2.5 is most appropriate. Rotation is marked off the epicondylar axis and confirmed by creating a rectangular flexion gap at 90 degrees. The size 2.5 cutting block is pinned in this rotation and the anterior, posterior and chamfer cuts are made with the oscillating saw. The intercondylar block is then placed and that cut is made.      Trial size 2.5 tibial component, trial size 2.5 posterior stabilized femur and a 12.5  mm posterior  stabilized rotating platform insert trial is placed. Full extension is achieved with excellent varus/valgus and anterior/posterior balance throughout full range of motion. The patella is everted and thickness measured to be 22  mm. Free hand resection is taken to 12 mm, a 38 template is placed, lug holes are drilled, trial patella is placed, and it tracks normally. Osteophytes are removed off the  posterior femur with the trial in place. All trials are removed and the cut bone surfaces prepared with pulsatile lavage. Cement is mixed and once ready for implantation, the size 2.5 tibial implant, size  2.5 posterior stabilized femoral component, and the size 38 patella are cemented in place and the patella is held with the clamp. The trial insert is placed and the knee held in full extension. All extruded cement is removed and once the cement is hard the permanent 12.5 mm posterior stabilized rotating platform insert is placed into the tibial tray.      The wound is copiously irrigated with saline solution and the extensor mechanism closed over a hemovac drain with #1 PDS suture. The tourniquet is released for a total tourniquet time of 31  minutes. Flexion against gravity is 135 degrees and the patella tracks normally. Subcutaneous tissue is closed with 2.0 vicryl and subcuticular with running 4.0 Monocryl. The catheter for the Marcaine pain pump is placed and the pump is initiated. The incision is cleaned and dried and steri-strips and a bulky sterile dressing are applied. The limb is placed into a knee immobilizer and the patient is awakened and transported to recovery in stable condition.      Please note that a surgical assistant was a medical necessity for this procedure in order to perform it in a safe and expeditious manner. Surgical assistant was necessary to retract the ligaments and vital neurovascular structures to prevent injury to them and also necessary for proper positioning of the limb to allow for anatomic placement of the prosthesis.   Gus Rankin Loura Pitt, MD    01/24/2012, 3:31 PM

## 2012-01-24 NOTE — Anesthesia Procedure Notes (Signed)
Spinal  Patient location during procedure: OR Staffing Anesthesiologist: Lucille Passy F Performed by: anesthesiologist  Preanesthetic Checklist Completed: patient identified, site marked, surgical consent, pre-op evaluation, timeout performed, IV checked, risks and benefits discussed and monitors and equipment checked Spinal Block Patient position: sitting Prep: Betadine Patient monitoring: heart rate, continuous pulse ox and blood pressure Approach: midline Location: L3-4 Injection technique: single-shot Needle Needle type: Quincke  Needle gauge: 22 G Needle length: 9 cm Additional Notes Expiration date of kit checked and confirmed. Patient tolerated procedure well, without complications. Negative heme/paresthesia Lot [id number] DOE 02/2013

## 2012-01-24 NOTE — Anesthesia Preprocedure Evaluation (Addendum)
Anesthesia Evaluation  Patient identified by MRN, date of birth, ID band Patient awake    Reviewed: Allergy & Precautions, H&P , NPO status , Patient's Chart, lab work & pertinent test results  History of Anesthesia Complications Negative for: history of anesthetic complications  Airway Mallampati: II TM Distance: >3 FB Neck ROM: Full    Dental  (+) Teeth Intact, Edentulous Upper and Dental Advisory Given   Pulmonary neg pulmonary ROS, shortness of breath and with exertion, COPD COPD inhaler, former smoker,    Pulmonary exam normal + decreased breath sounds+ wheezing      Cardiovascular hypertension, Pt. on medications Rhythm:Regular Rate:Normal     Neuro/Psych Anxiety Spinal stenosis with chronic LBP. negative neurological ROS  negative psych ROS   GI/Hepatic Neg liver ROS, PUD, GERD-  ,  Endo/Other  negative endocrine ROS  Renal/GU negative Renal ROS  negative genitourinary   Musculoskeletal negative musculoskeletal ROS (+)   Abdominal (+) + obese,   Peds  Hematology negative hematology ROS (+)   Anesthesia Other Findings   Reproductive/Obstetrics negative OB ROS                         Anesthesia Physical Anesthesia Plan  ASA: III  Anesthesia Plan: Spinal   Post-op Pain Management:    Induction:   Airway Management Planned: Simple Face Mask  Additional Equipment:   Intra-op Plan:   Post-operative Plan:   Informed Consent: I have reviewed the patients History and Physical, chart, labs and discussed the procedure including the risks, benefits and alternatives for the proposed anesthesia with the patient or authorized representative who has indicated his/her understanding and acceptance.   Dental advisory given  Plan Discussed with: CRNA  Anesthesia Plan Comments: (Given patients recent history of bronchitis and limited exercise tolerance, will elect to perform surgical  procedure under spinal anesthetic. Patient informed me of need for surgical intervention for chronic spinal stenosis. Ms. Clapham made aware that in the event of failed spinal anesthesia, we will proceed with GETA. A Cardale Dorer MD)       Anesthesia Quick Evaluation

## 2012-01-24 NOTE — Transfer of Care (Signed)
Immediate Anesthesia Transfer of Care Note  Patient: Glenda Terry  Procedure(s) Performed: Procedure(s) (LRB) with comments: TOTAL KNEE ARTHROPLASTY (Right)  Patient Location: PACU  Anesthesia Type:Regional  Level of Consciousness: awake, alert  and oriented  Airway & Oxygen Therapy: Patient Spontanous Breathing and Patient connected to face mask oxygen  Post-op Assessment: Report given to PACU RN and Post -op Vital signs reviewed and stable  Post vital signs: Reviewed and stable  Complications: No apparent anesthesia complications

## 2012-01-24 NOTE — Interval H&P Note (Signed)
History and Physical Interval Note:  01/24/2012 2:12 PM  Glenda Terry  has presented today for surgery, with the diagnosis of Osteoarthritis of the Right Knee  The various methods of treatment have been discussed with the patient and family. After consideration of risks, benefits and other options for treatment, the patient has consented to  Procedure(s) (LRB) with comments: TOTAL KNEE ARTHROPLASTY (Right) as a surgical intervention .  The patient's history has been reviewed, patient examined, no change in status, stable for surgery.  I have reviewed the patient's chart and labs.  Questions were answered to the patient's satisfaction.     Loanne Drilling

## 2012-01-24 NOTE — Anesthesia Postprocedure Evaluation (Signed)
Anesthesia Post Note  Patient: Glenda Terry  Procedure(s) Performed: Procedure(s) (LRB): TOTAL KNEE ARTHROPLASTY (Right)  Anesthesia type: Spinal  Patient location: PACU  Post pain: Pain level controlled  Post assessment: Post-op Vital signs reviewed  Last Vitals: BP 107/64  Pulse 51  Temp 36.4 C (Oral)  Resp 13  SpO2 100%  Post vital signs: Reviewed  Level of consciousness: sedated  Complications: No apparent anesthesia complications

## 2012-01-25 ENCOUNTER — Encounter (HOSPITAL_COMMUNITY): Payer: Self-pay | Admitting: Orthopedic Surgery

## 2012-01-25 DIAGNOSIS — D62 Acute posthemorrhagic anemia: Secondary | ICD-10-CM | POA: Insufficient documentation

## 2012-01-25 DIAGNOSIS — E871 Hypo-osmolality and hyponatremia: Secondary | ICD-10-CM

## 2012-01-25 LAB — BASIC METABOLIC PANEL
CO2: 29 mEq/L (ref 19–32)
Chloride: 97 mEq/L (ref 96–112)
Glucose, Bld: 122 mg/dL — ABNORMAL HIGH (ref 70–99)
Potassium: 4.2 mEq/L (ref 3.5–5.1)
Sodium: 133 mEq/L — ABNORMAL LOW (ref 135–145)

## 2012-01-25 LAB — CBC
Hemoglobin: 10.2 g/dL — ABNORMAL LOW (ref 12.0–15.0)
Platelets: 239 10*3/uL (ref 150–400)
RBC: 4.2 MIL/uL (ref 3.87–5.11)
WBC: 10.8 10*3/uL — ABNORMAL HIGH (ref 4.0–10.5)

## 2012-01-25 MED ORDER — POLYSACCHARIDE IRON COMPLEX 150 MG PO CAPS
150.0000 mg | ORAL_CAPSULE | Freq: Every day | ORAL | Status: DC
  Administered 2012-01-25 – 2012-01-27 (×3): 150 mg via ORAL
  Filled 2012-01-25 (×3): qty 1

## 2012-01-25 MED ORDER — ESOMEPRAZOLE MAGNESIUM 40 MG PO CPDR
40.0000 mg | DELAYED_RELEASE_CAPSULE | Freq: Every day | ORAL | Status: DC
  Administered 2012-01-25 – 2012-01-27 (×3): 40 mg via ORAL
  Filled 2012-01-25 (×3): qty 1

## 2012-01-25 MED ORDER — NON FORMULARY: 40.0000 mg | Freq: Every day | Status: DC

## 2012-01-25 NOTE — Progress Notes (Signed)
Physical Therapy Treatment Patient Details Name: Glenda Terry MRN: [id number] DOB: 1941/03/09 Today's Date: 01/25/2012 Time: 4098-1191 PT Time Calculation (min): 30 min  PT Assessment / Plan / Recommendation Comments on Treatment Session  Progressing slowly. Fatigues easily. Recommend SNF.     Follow Up Recommendations  SNF     Does the patient have the potential to tolerate intense rehabilitation     Barriers to Discharge        Equipment Recommendations  Rolling walker with 5" wheels;3 in 1 bedside comode    Recommendations for Other Services OT consult  Frequency 7X/week   Plan Discharge plan remains appropriate    Precautions / Restrictions Precautions Precautions: Knee Required Braces or Orthoses: Knee Immobilizer - Right Knee Immobilizer - Right: Discontinue once straight leg raise with < 10 degree lag Restrictions Weight Bearing Restrictions: No RLE Weight Bearing: Weight bearing as tolerated   Pertinent Vitals/Pain R knee with activity-unrated    Mobility  Bed Mobility Bed Mobility: Supine to Sit;Sit to Supine Supine to Sit: 2: Max assist Sit to Supine: 2: Max assist Details for Bed Mobility Assistance: Assist for trunk to upright/supine and bil LEs on/off bed. VCs safety, technique, hand placement.  Transfers Transfers: Sit to Stand;Stand to Sit Sit to Stand: 3: Mod assist Stand to Sit: 3: Mod assist Details for Transfer Assistance: VCs safety, technique, hand placement. Assist to rise, stabilize, contol descent.  Ambulation/Gait Ambulation/Gait Assistance: 3: Mod assist Ambulation Distance (Feet): 25 Feet Assistive device: Rolling walker Ambulation/Gait Assistance Details: VCs safety, technique, sequence. Assist to stabilize and maneuver with RW. Fatigues easily.  Gait Pattern: Step-to pattern;Antalgic;Decreased stride length;Decreased step length - right;Decreased step length - left    Exercises Total Joint Exercises Ankle Circles/Pumps:  AROM;Both;10 reps;Supine Quad Sets: AROM;Right;10 reps;Supine Short Arc Quad: AAROM;Right;10 reps;Supine Heel Slides: AAROM;Right;10 reps;Supine Hip ABduction/ADduction: AAROM;Right;10 reps;Supine Straight Leg Raises: AAROM;Right;10 reps;Supine   PT Diagnosis:    PT Problem List:   PT Treatment Interventions:     PT Goals Acute Rehab PT Goals Pt will go Supine/Side to Sit: with supervision PT Goal: Supine/Side to Sit - Progress: Progressing toward goal Pt will go Sit to Supine/Side: with supervision PT Goal: Sit to Supine/Side - Progress: Progressing toward goal Pt will go Sit to Stand: with supervision PT Goal: Sit to Stand - Progress: Progressing toward goal Pt will Ambulate: 51 - 150 feet;with supervision;with rolling walker PT Goal: Ambulate - Progress: Progressing toward goal  Visit Information  Last PT Received On: 01/25/12 Assistance Needed:  (+1.5)    Subjective Data  Subjective: "I don't know why I'm so drowsy today" Patient Stated Goal: Rehab   Cognition  Overall Cognitive Status: Appears within functional limits for tasks assessed/performed Arousal/Alertness: Awake/alert Orientation Level: Appears intact for tasks assessed Behavior During Session: [facility] for tasks performed    Balance     End of Session PT - End of Session Equipment Utilized During Treatment: Gait belt;Right knee immobilizer Activity Tolerance: Patient limited by fatigue;Patient limited by pain Patient left: in bed;with call bell/phone within reach   GP     [name] [place] 01/25/2012, 2:26 PM [phone]

## 2012-01-25 NOTE — Care Management Note (Signed)
    Page 1 of 2   01/25/2012     5:02:08 PM   CARE MANAGEMENT NOTE 01/25/2012  Patient:  Glenda Terry, Glenda Terry   Account Number:  [id number]  Date Initiated:  01/25/2012  Documentation initiated by:  Colleen Can  Subjective/Objective Assessment:   dx osteoarthritis rt knee, total knee replacemnt     Action/Plan:   Cm spoke with patient. She is requesting SNF rehab   Anticipated DC Date:  01/27/2012   Anticipated DC Plan:  SKILLED NURSING FACILITY  In-house referral  Clinical Social Worker      DC Planning Services  CM consult      [facility] Choice  NA   Choice offered to / List presented to:  NA   DME arranged  NA      DME agency  NA     HH arranged  NA      HH agency  NA   Status of service:  Completed, signed off Medicare Important Message given?   (If response is "NO", the following Medicare IM given date fields will be blank) Date Medicare IM given:   Date Additional Medicare IM given:    Discharge Disposition:    Per UR Regulation:    If discussed at Long Length of Stay Meetings, dates discussed:    Comments:

## 2012-01-25 NOTE — Evaluation (Signed)
Physical Therapy Evaluation Patient Details Name: Glenda Terry MRN: [id number] DOB: 04-10-1940 Today's Date: 01/25/2012 Time: 4098-1191 PT Time Calculation (min): 21 min  PT Assessment / Plan / Recommendation Clinical Impression  71 yo female s/p R TKA. Pt somwehat drowsy on eval. Moderate assist for mobility. Mobility limited by pain. Recommend SNF for continued rehab.    PT Assessment  Patient needs continued PT services    Follow Up Recommendations  SNF    Does the patient have the potential to tolerate intense rehabilitation      Barriers to Discharge        Equipment Recommendations  Rolling walker with 5" wheels    Recommendations for Other Services OT consult   Frequency 7X/week    Precautions / Restrictions Precautions Precautions: Knee Required Braces or Orthoses: Knee Immobilizer - Right Knee Immobilizer - Right: Discontinue once straight leg raise with < 10 degree lag Restrictions Weight Bearing Restrictions: No RLE Weight Bearing: Weight bearing as tolerated   Pertinent Vitals/Pain 7/10 R knee      Mobility  Bed Mobility Bed Mobility: Supine to Sit Supine to Sit: 3: Mod assist;HOB elevated Details for Bed Mobility Assistance: Assist for R LE off bed. Increased time. VCs safety, technique, hand placement.  Transfers Transfers: Sit to Stand;Stand to Sit Sit to Stand: From bed;With upper extremity assist;3: Mod assist Stand to Sit: To chair/3-in-1;With armrests;3: Mod assist Details for Transfer Assistance: VCs safety, technique, hand placement. Assist to rise, stabilize, control descent.  Ambulation/Gait Ambulation/Gait Assistance: 4: Min assist Ambulation Distance (Feet): 8 Feet Assistive device: Rolling walker Ambulation/Gait Assistance Details: VCs safety, technique, sequence. Assist to stabilize throughout ambulation. Followed with recliner. Fatigues easily.  Gait Pattern: Step-to pattern;Decreased stride length;Decreased step length -  right;Decreased step length - left;Antalgic    Shoulder Instructions     Exercises     PT Diagnosis: Difficulty walking;Abnormality of gait;Acute pain  PT Problem List: Decreased strength;Decreased range of motion;Decreased activity tolerance;Decreased mobility;Pain;Decreased knowledge of use of DME PT Treatment Interventions: DME instruction;Gait training;Functional mobility training;Therapeutic activities;Therapeutic exercise;Patient/family education   PT Goals Acute Rehab PT Goals PT Goal Formulation: With patient Time For Goal Achievement: 02/01/12 Potential to Achieve Goals: Good Pt will go Supine/Side to Sit: with supervision PT Goal: Supine/Side to Sit - Progress: Goal set today Pt will go Sit to Supine/Side: with supervision PT Goal: Sit to Supine/Side - Progress: Goal set today Pt will go Sit to Stand: with supervision PT Goal: Sit to Stand - Progress: Goal set today Pt will Transfer Bed to Chair/Chair to Bed: with supervision PT Transfer Goal: Bed to Chair/Chair to Bed - Progress: Goal set today Pt will Ambulate: 51 - 150 feet;with supervision;with rolling walker PT Goal: Ambulate - Progress: Goal set today  Visit Information  Last PT Received On: 01/25/12 Assistance Needed: +1 (+1.5 for chair)    Subjective Data  Subjective: "I'm just sleepy" Patient Stated Goal: Rehab   Prior Functioning  Home Living Lives With: Alone Type of Home: House Home Access: Stairs to enter Home Layout: One level Bathroom Toilet: Handicapped height Home Adaptive Equipment: Tub transfer bench;Straight cane Prior Function Level of Independence: Independent Able to Take Stairs?: Yes Driving: Yes Communication Communication: No difficulties    Cognition  Overall Cognitive Status: Appears within functional limits for tasks assessed/performed Arousal/Alertness: Lethargic Orientation Level: Appears intact for tasks assessed Behavior During Session: [facility] for tasks performed      Extremity/Trunk Assessment Right Lower Extremity Assessment RLE ROM/Strength/Tone: Deficits RLE ROM/Strength/Tone Deficits:  SLR 2/5, moves ankle well RLE Sensation: WFL - Light Touch Left Lower Extremity Assessment LLE ROM/Strength/Tone: WFL for tasks assessed   Balance    End of Session PT - End of Session Equipment Utilized During Treatment: Gait belt;Right knee immobilizer Activity Tolerance: Patient limited by pain;Patient limited by fatigue Patient left: in chair;with call bell/phone within reach  GP     [name] [facility] 01/25/2012, 9:20 AM [id number]

## 2012-01-25 NOTE — Progress Notes (Signed)
Clinical Social Work Department BRIEF PSYCHOSOCIAL ASSESSMENT 01/25/2012  Patient:  Glenda Terry, Glenda Terry     Account Number:  [id number]     Admit date:  01/24/2012  Clinical Social Worker:  Candie Chroman  Date/Time:  01/25/2012 03:12 PM  Referred by:  Physician  Date Referred:  01/25/2012 Referred for  SNF Placement   Other Referral:   Interview type:  Patient Other interview type:    PSYCHOSOCIAL DATA Living Status:  ALONE Admitted from facility:   Level of care:   Primary support name:  Wynonia Lawman Primary support relationship to patient:  CHILD, ADULT Degree of support available:   supportive    CURRENT CONCERNS Current Concerns  Post-Acute Placement   Other Concerns:    SOCIAL WORK ASSESSMENT / PLAN Pt is a86 yr old female living at home prior to hospitalization. CSW met with pt/daughter to assist with d/c planning. Pt requires ST Rehab following hospital d/c. SNF search has been initiated . Pt requesting SNF placement at [facility]. SNF contacted and is able to offer placement for pt when ready for d/c.   Assessment/plan status:  Psychosocial Support/Ongoing Assessment of Needs Other assessment/ plan:   Information/referral to community resources:   SNF list provided.    PATIENT'S/FAMILY'S RESPONSE TO PLAN OF CARE: Pt is looking forward to rehab at [facility].    Cori Razor LCSW [phone]

## 2012-01-25 NOTE — Evaluation (Signed)
Occupational Therapy Evaluation Patient Details Name: Glenda Terry MRN: [id number] DOB: Dec 10, 1940 Today's Date: 01/25/2012 Time: 4098-1191 OT Time Calculation (min): 19 min  OT Assessment / Plan / Recommendation Clinical Impression  Pt presents with RTKR POD 1. Limited this session by fatigue and pain. Skilled OT indicated to maximize independence with BADLs to min A level in prep for d/c to next venue of care.    OT Assessment  Patient needs continued OT Services    Follow Up Recommendations  SNF    Barriers to Discharge Decreased caregiver support;Inaccessible home environment    Equipment Recommendations  Rolling walker with 5" wheels;3 in 1 bedside comode    Recommendations for Other Services    Frequency  Min 1X/week    Precautions / Restrictions Precautions Precautions: Knee Required Braces or Orthoses: Knee Immobilizer - Right Knee Immobilizer - Right: Discontinue once straight leg raise with < 10 degree lag Restrictions Weight Bearing Restrictions: No RLE Weight Bearing: Weight bearing as tolerated   Pertinent Vitals/Pain Reported 10/10 pain in RLE. RN aware, repositioned and cold applied.    ADL  Grooming: Simulated;Set up Where Assessed - Grooming: Unsupported sitting Upper Body Bathing: Simulated;Minimal assistance Where Assessed - Upper Body Bathing: Unsupported sitting Lower Body Bathing: Simulated;Maximal assistance Where Assessed - Lower Body Bathing: Supported sit to stand Upper Body Dressing: Simulated;Minimal assistance Where Assessed - Upper Body Dressing: Unsupported sitting Lower Body Dressing: Simulated;Maximal assistance Where Assessed - Lower Body Dressing: Supported sit to stand Toilet Transfer: Simulated;Moderate assistance Toilet Transfer Method: Stand pivot Toilet Transfer Equipment: Other (comment) (back to bed.) Toileting - Clothing Manipulation and Hygiene: Simulated;Maximal assistance Equipment Used: Rolling  walker Transfers/Ambulation Related to ADLs: Pt limited by pain and lethargy. Assisted pt back to bed.    OT Diagnosis: Generalized weakness  OT Problem List: Decreased activity tolerance;Decreased safety awareness;Decreased knowledge of use of DME or AE;Pain OT Treatment Interventions: Self-care/ADL training;Therapeutic activities;DME and/or AE instruction;Patient/family education   OT Goals Acute Rehab OT Goals OT Goal Formulation: With patient/family Time For Goal Achievement: 02/01/12 Potential to Achieve Goals: Good ADL Goals Pt Will Perform Grooming: Standing at sink;Other (comment) (w/minguard A) Pt Will Perform Lower Body Bathing: with min assist;Sit to stand from chair;Sit to stand from bed ADL Goal: Lower Body Bathing - Progress: Goal set today Pt Will Perform Lower Body Dressing: with min assist;Sit to stand from bed;Sit to stand from chair ADL Goal: Lower Body Dressing - Progress: Goal set today Pt Will Transfer to Toilet: 3-in-1;Ambulation;Stand pivot transfer;Other (comment) (w/minguard A) ADL Goal: Toilet Transfer - Progress: Goal set today Pt Will Perform Toileting - Clothing Manipulation: with min assist;Sitting on 3-in-1 or toilet;Standing ADL Goal: Toileting - Clothing Manipulation - Progress: Goal set today Pt Will Perform Toileting - Hygiene: with min assist;Sit to stand from 3-in-1/toilet ADL Goal: Toileting - Hygiene - Progress: Goal set today  Visit Information  Last OT Received On: 01/25/12 Assistance Needed: +1 (+1.5 for chair))    Subjective Data  Subjective: I just want to lay down.... Patient Stated Goal: Eventually return home following rehab.   Prior Functioning     Home Living Lives With: Alone Type of Home: House Home Access: Stairs to enter Home Layout: One level Bathroom Toilet: Handicapped height Home Adaptive Equipment: Tub transfer bench;Straight cane Prior Function Level of Independence: Independent Able to Take Stairs?:  Yes Driving: Yes Communication Communication: No difficulties Dominant Hand: Right         Vision/Perception     Cognition  Overall  Cognitive Status: Appears within functional limits for tasks assessed/performed Arousal/Alertness: Lethargic Orientation Level: Appears intact for tasks assessed Behavior During Session: [facility] for tasks performed    Extremity/Trunk Assessment Right Upper Extremity Assessment RUE ROM/Strength/Tone: [facility] for tasks assessed Left Upper Extremity Assessment LUE ROM/Strength/Tone: WFL for tasks assessed     Mobility Bed Mobility Sit to Supine: 2: Max assist Details for Bed Mobility Assistance: Max A needed for trunk and LEs. Pt utilized trapeze. Max cues for hand placement and technique. Transfers Sit to Stand: 3: Mod assist;With upper extremity assist;From chair/3-in-1;With armrests Stand to Sit: 3: Mod assist;With upper extremity assist;To bed Details for Transfer Assistance: VCs safety, technique, hand placement. Assist to rise, stabilize, control descent.      Shoulder Instructions     Exercise     Balance     End of Session OT - End of Session Activity Tolerance: Patient limited by fatigue;Patient limited by pain Patient left: in bed;with call bell/phone within reach  GO     Kaylana Fenstermacher A OTR/L [phone] 01/25/2012, 12:57 PM

## 2012-01-25 NOTE — Progress Notes (Signed)
Clinical Social Work Department CLINICAL SOCIAL WORK PLACEMENT NOTE 01/25/2012  Patient:  Glenda Terry, Glenda Terry  Account Number:  [id number] Admit date:  01/24/2012  Clinical Social Worker:  Cori Razor, LCSW  Date/time:  01/25/2012 03:19 PM  Clinical Social Work is seeking post-discharge placement for this patient at the following level of care:   SKILLED NURSING   (*CSW will update this form in Epic as items are completed)   01/25/2012  Patient/family provided with [facility] Department of Clinical Social Work's list of facilities offering this level of care within the geographic area requested by the patient (or if unable, by the patient's family).  01/25/2012  Patient/family informed of their freedom to choose among providers that offer the needed level of care, that participate in Medicare, Medicaid or managed care program needed by the patient, have an available bed and are willing to accept the patient.  01/25/2012  Patient/family informed of [facility]' ownership interest in [facility], as well as of the fact that they are under no obligation to receive care at this facility.  PASARR submitted to EDS on 01/25/2012 PASARR number received from EDS on 01/25/2012  FL2 transmitted to all facilities in geographic area requested by pt/family on  01/25/2012 FL2 transmitted to all facilities within larger geographic area on   Patient informed that his/her managed care company has contracts with or will negotiate with  certain facilities, including the following:     Patient/family informed of bed offers received:  01/25/2012 Patient chooses bed at [facility] Physician recommends and patient chooses bed at    Patient to be transferred to  on   Patient to be transferred to facility by   The following physician request were entered in Epic:   Additional Comments:  Cori Razor LCSW [phone]

## 2012-01-25 NOTE — Progress Notes (Signed)
UR COMPLETED  

## 2012-01-25 NOTE — Progress Notes (Signed)
   Subjective: 1 Day Post-Op Procedure(s) (LRB): TOTAL KNEE ARTHROPLASTY (Right) Patient reports pain as mild.  Very pleasant during rounding visit. Patient seen in rounds with Dr. Lequita Halt. She is doing well. Patient is well, and has had no acute complaints or problems We will start therapy today.  Plan is to go Skilled nursing facility after hospital stay.  Objective: Vital signs in last 24 hours: Temp:  [97.5 F (36.4 C)-98.7 F (37.1 C)] 98.7 F (37.1 C) (11/12 0624) Pulse Rate:  [51-91] 59  (11/12 0624) Resp:  [12-22] 16  (11/12 0624) BP: (106-166)/(47-87) 165/73 mmHg (11/12 0624) SpO2:  [97 %-100 %] 99 % (11/12 0745) FiO2 (%):  [97 %] 97 % (11/11 1715) Weight:  [88.9 kg (195 lb 15.8 oz)] 88.9 kg (195 lb 15.8 oz) (11/11 1715)  Intake/Output from previous day:  Intake/Output Summary (Last 24 hours) at 01/25/12 0759 Last data filed at 01/25/12 1610  Gross per 24 hour  Intake   2967 ml  Output   3145 ml  Net   -178 ml    Intake/Output this shift: UOP 1275 -178  Labs:  Basename 01/25/12 0509  HGB 10.2*    Basename 01/25/12 0509  WBC 10.8*  RBC 4.20  HCT 32.6*  PLT 239    Basename 01/25/12 0509  NA 133*  K 4.2  CL 97  CO2 29  BUN 9  CREATININE 1.07  GLUCOSE 122*  CALCIUM 8.8   No results found for this basename: LABPT:2,INR:2 in the last 72 hours  EXAM General - Patient is Alert, Appropriate and Oriented, Very Pleasant Extremity - Neurovascular intact Sensation intact distally Dorsiflexion/Plantar flexion intact Dressing - dressing C/D/I Motor Function - intact, moving foot and toes well on exam.  Hemovac pulled without difficulty.  Past Medical History  Diagnosis Date  . COPD (chronic obstructive pulmonary disease)   . Back pain, chronic   . Anxiety   . Hypertension   . Arthritis   . Shortness of breath   . GERD (gastroesophageal reflux disease)     Assessment/Plan: 1 Day Post-Op Procedure(s) (LRB): TOTAL KNEE ARTHROPLASTY  (Right) Principal Problem:  *OA (osteoarthritis) of knee Active Problems:  Postop Hyponatremia  Anemia  Estimated Body mass index is 38.28 kg/(m^2) as calculated from the following:   Height as of this encounter: 5\' 0" (1.524 m).   Weight as of this encounter: 195 lb 15.8 oz(88.9 kg). Advance diet Up with therapy Discharge to SNF  DVT Prophylaxis - Xarelto Weight-Bearing as tolerated to right leg No vaccines. D/C O2 and Pulse OX and try on Room Air  Glenda Terry, Marlowe Sax 01/25/2012, 7:59 AM

## 2012-01-26 LAB — BASIC METABOLIC PANEL
BUN: 9 mg/dL (ref 6–23)
CO2: 26 mEq/L (ref 19–32)
Chloride: 89 mEq/L — ABNORMAL LOW (ref 96–112)
Creatinine, Ser: 1.12 mg/dL — ABNORMAL HIGH (ref 0.50–1.10)
Glucose, Bld: 119 mg/dL — ABNORMAL HIGH (ref 70–99)

## 2012-01-26 LAB — CBC
HCT: 33 % — ABNORMAL LOW (ref 36.0–46.0)
Hemoglobin: 10.6 g/dL — ABNORMAL LOW (ref 12.0–15.0)
MCV: 75.7 fL — ABNORMAL LOW (ref 78.0–100.0)
Platelets: 260 10*3/uL (ref 150–400)
RBC: 4.36 MIL/uL (ref 3.87–5.11)
WBC: 14.8 10*3/uL — ABNORMAL HIGH (ref 4.0–10.5)

## 2012-01-26 NOTE — Progress Notes (Signed)
Physical Therapy Treatment Patient Details Name: Glenda Terry MRN: [id number] DOB: [birth year] Today's Date: 01/26/2012 Time: 2956-2130 PT Time Calculation (min): 23 min  PT Assessment / Plan / Recommendation Comments on Treatment Session  Progressing well with mobility. Pt more alert today. Improved activity tolerance.Recommend SNF    Follow Up Recommendations  SNF     Does the patient have the potential to tolerate intense rehabilitation     Barriers to Discharge        Equipment Recommendations  Rolling walker with 5" wheels;3 in 1 bedside comode    Recommendations for Other Services OT consult  Frequency 7X/week   Plan Discharge plan remains appropriate    Precautions / Restrictions Precautions Precautions: Knee Required Braces or Orthoses: Knee Immobilizer - Right Knee Immobilizer - Right: Discontinue once straight leg raise with < 10 degree lag Restrictions Weight Bearing Restrictions: No RLE Weight Bearing: Weight bearing as tolerated   Pertinent Vitals/Pain R knee with activity- 6/10    Mobility  Bed Mobility Bed Mobility: Supine to Sit Supine to Sit: 3: Mod assist;HOB elevated;With rails Details for Bed Mobility Assistance: Asssit for trunk to upright and R LE off bed. Utilized bedpad to assist with scooting, positioning.  Transfers Transfers: Sit to Stand;Stand to Sit;Stand Pivot Transfers Sit to Stand: 3: Mod assist;From bed;From chair/3-in-1 Stand to Sit: 3: Mod assist;To chair/3-in-1 Stand Pivot Transfers: 3: Mod assist Details for Transfer Assistance: x 2. VCs safety, technique, hand placement. Assist to rise, stabilize, control descent, maneuver with RW.  Ambulation/Gait Ambulation/Gait Assistance: 4: Min assist Ambulation Distance (Feet): 80 Feet Assistive device: Rolling walker Ambulation/Gait Assistance Details: VCs safety, technique, step length, distance from RW. Mod cues for sequencing. Assist to stabilize and maneuver with RW.  Gait  Pattern: Step-to pattern;Decreased step length - right;Decreased stride length;Antalgic;Trunk flexed    Exercises     PT Diagnosis:    PT Problem List:   PT Treatment Interventions:     PT Goals Acute Rehab PT Goals Pt will go Supine/Side to Sit: with supervision PT Goal: Supine/Side to Sit - Progress: Progressing toward goal Pt will go Sit to Stand: with supervision PT Goal: Sit to Stand - Progress: Progressing toward goal Pt will Transfer Bed to Chair/Chair to Bed: with supervision PT Transfer Goal: Bed to Chair/Chair to Bed - Progress: Progressing toward goal Pt will Ambulate: 51 - 150 feet;with supervision;with rolling walker PT Goal: Ambulate - Progress: Progressing toward goal  Visit Information  Last PT Received On: 01/26/12 Assistance Needed: +1    Subjective Data  Subjective: "I'm ready. I've been waiting on you. where have you been?" Patient Stated Goal: Rehab   Cognition  Overall Cognitive Status: Appears within functional limits for tasks assessed/performed Arousal/Alertness: Awake/alert Orientation Level: Appears intact for tasks assessed Behavior During Session: [facility] for tasks performed    Balance     End of Session PT - End of Session Equipment Utilized During Treatment: Gait belt;Right knee immobilizer Activity Tolerance: Patient tolerated treatment well Patient left: in chair;with call bell/phone within reach   GP     [name] [facility] 01/26/2012, 10:53 AM [phone]

## 2012-01-26 NOTE — Progress Notes (Signed)
   Subjective: 2 Days Post-Op Procedure(s) (LRB): TOTAL KNEE ARTHROPLASTY (Right) Patient reports pain as mild.   Patient seen in rounds with Dr. Lequita Halt. Patient is well, and has had no acute complaints or problems Plan is to go Skilled nursing facility after hospital stay.  Objective: Vital signs in last 24 hours: Temp:  [98.2 F (36.8 C)-99.7 F (37.6 C)] 99.7 F (37.6 C) (11/13 2148) Pulse Rate:  [96-108] 108  (11/13 2148) Resp:  [16] 16  (11/13 1341) BP: (103-138)/(69-82) 105/79 mmHg (11/13 2148) SpO2:  [95 %-98 %] 97 % (11/13 2148)  Intake/Output from previous day:  Intake/Output Summary (Last 24 hours) at 01/26/12 2229 Last data filed at 01/26/12 1300  Gross per 24 hour  Intake   1170 ml  Output   1500 ml  Net   -330 ml    Intake/Output this shift:    Labs:  Basename 01/26/12 0440 01/25/12 0509  HGB 10.6* 10.2*    Basename 01/26/12 0440 01/25/12 0509  WBC 14.8* 10.8*  RBC 4.36 4.20  HCT 33.0* 32.6*  PLT 260 239    Basename 01/26/12 0440 01/25/12 0509  NA 125* 133*  K 4.0 4.2  CL 89* 97  CO2 26 29  BUN 9 9  CREATININE 1.12* 1.07  GLUCOSE 119* 122*  CALCIUM 9.1 8.8   No results found for this basename: LABPT:2,INR:2 in the last 72 hours  EXAM General - Patient is Alert, Appropriate and Oriented Extremity - Neurovascular intact Sensation intact distally Dorsiflexion/Plantar flexion intact No cellulitis present Dressing/Incision - clean, dry, healing Motor Function - intact, moving foot and toes well on exam.   Past Medical History  Diagnosis Date  . COPD (chronic obstructive pulmonary disease)   . Back pain, chronic   . Anxiety   . Hypertension   . Arthritis   . Shortness of breath   . GERD (gastroesophageal reflux disease)     Assessment/Plan: 2 Days Post-Op Procedure(s) (LRB): TOTAL KNEE ARTHROPLASTY (Right) Principal Problem:  *OA (osteoarthritis) of knee Active Problems:  Postop Hyponatremia  Anemia  Estimated Body mass  index is 38.28 kg/(m^2) as calculated from the following:   Height as of this encounter: 5\' 0" (1.524 m).   Weight as of this encounter: 195 lb 15.8 oz(88.9 kg). Up with therapy Plan for discharge tomorrow Discharge to SNF  DVT Prophylaxis - Xarelto Weight-Bearing as tolerated to right leg  Kordelia Severin 01/26/2012, 10:29 PM

## 2012-01-26 NOTE — Progress Notes (Signed)
Physical Therapy Treatment Patient Details Name: Glenda Terry MRN: [id number] DOB: Nov 18, 1940 Today's Date: 01/26/2012 Time: 8469-6295 PT Time Calculation (min): 29 min  PT Assessment / Plan / Recommendation Comments on Treatment Session      Follow Up Recommendations  SNF     Does the patient have the potential to tolerate intense rehabilitation     Barriers to Discharge        Equipment Recommendations  Rolling walker with 5" wheels;3 in 1 bedside comode    Recommendations for Other Services OT consult  Frequency 7X/week   Plan Discharge plan remains appropriate    Precautions / Restrictions Precautions Precautions: Knee Required Braces or Orthoses: Knee Immobilizer - Right Knee Immobilizer - Right: Discontinue once straight leg raise with < 10 degree lag Restrictions Weight Bearing Restrictions: No RLE Weight Bearing: Weight bearing as tolerated   Pertinent Vitals/Pain 5/10 R knee    Mobility  Bed Mobility Bed Mobility: Supine to Sit;Sit to Supine Supine to Sit: 3: Mod assist Sit to Supine: 3: Mod assist Details for Bed Mobility Assistance: Asssit for trunk to upright and R LE off bed. Utilized bedpad to assist with scooting, positioning Transfers Transfers: Sit to Stand;Stand to Sit Sit to Stand: 3: Mod assist;From bed Stand to Sit: 4: Min assist;To bed Stand Pivot Transfers: 3: Mod assist Details for Transfer Assistance: VCs safety, technique, hand placement. Assist to rise, stabilize, control descent Ambulation/Gait Ambulation/Gait Assistance: 4: Min assist Ambulation Distance (Feet): 65 Feet Assistive device: Rolling walker Ambulation/Gait Assistance Details: VCs safety, technique, step length, distance from RW. Mod cues for sequencing. Assist to stabilize and maneuver with RW Gait Pattern: Step-to pattern;Trunk flexed;Antalgic;Decreased stride length;Decreased step length - right;Decreased step length - left    Exercises Total Joint  Exercises Ankle Circles/Pumps: AROM;Both;10 reps;Supine Quad Sets: AROM;Both;10 reps;Supine Short Arc Quad: AAROM;Right;10 reps;Supine Heel Slides: AAROM;Right;10 reps;Supine Hip ABduction/ADduction: AAROM;Right;10 reps;Supine Straight Leg Raises: AAROM;Right;10 reps;Supine   PT Diagnosis:    PT Problem List:   PT Treatment Interventions:     PT Goals Acute Rehab PT Goals Pt will go Supine/Side to Sit: with supervision PT Goal: Supine/Side to Sit - Progress: Progressing toward goal Pt will go Sit to Supine/Side: with supervision PT Goal: Sit to Supine/Side - Progress: Progressing toward goal Pt will go Sit to Stand: with supervision PT Goal: Sit to Stand - Progress: Progressing toward goal Pt will Transfer Bed to Chair/Chair to Bed: with supervision PT Transfer Goal: Bed to Chair/Chair to Bed - Progress: Progressing toward goal Pt will Ambulate: 51 - 150 feet;with supervision;with rolling walker PT Goal: Ambulate - Progress: Progressing toward goal  Visit Information  Last PT Received On: 01/26/12 Assistance Needed: +1    Subjective Data  Subjective: "I think I'm getting better....better than yesterday anyway" Patient Stated Goal: Rehab   Cognition  Overall Cognitive Status: Appears within functional limits for tasks assessed/performed Arousal/Alertness: Awake/alert Orientation Level: Appears intact for tasks assessed Behavior During Session: [facility] for tasks performed    Balance     End of Session PT - End of Session Equipment Utilized During Treatment: Gait belt;Right knee immobilizer Activity Tolerance: Patient tolerated treatment well Patient left: in bed;with call bell/phone within reach   GP     [name] [facility] 01/26/2012, 2:34 PM [phone]

## 2012-01-27 ENCOUNTER — Inpatient Hospital Stay
Admission: RE | Admit: 2012-01-27 | Discharge: 2012-02-09 | Disposition: A | Discharge status: Home / Self Care | Payer: Medicare Other | Source: Ambulatory Visit | Attending: Internal Medicine | Admitting: Internal Medicine

## 2012-01-27 LAB — CBC
HCT: 27.9 % — ABNORMAL LOW (ref 36.0–46.0)
MCH: 24.7 pg — ABNORMAL LOW (ref 26.0–34.0)
MCV: 75 fL — ABNORMAL LOW (ref 78.0–100.0)
RDW: 14.7 % (ref 11.5–15.5)
WBC: 12.2 10*3/uL — ABNORMAL HIGH (ref 4.0–10.5)

## 2012-01-27 LAB — BASIC METABOLIC PANEL
BUN: 12 mg/dL (ref 6–23)
CO2: 24 mEq/L (ref 19–32)
Chloride: 92 mEq/L — ABNORMAL LOW (ref 96–112)
Glucose, Bld: 121 mg/dL — ABNORMAL HIGH (ref 70–99)
Potassium: 3.7 mEq/L (ref 3.5–5.1)

## 2012-01-27 MED ORDER — ACETAMINOPHEN 325 MG PO TABS: 650.0000 mg | ORAL_TABLET | Freq: Four times a day (QID) | ORAL | Status: DC | PRN

## 2012-01-27 MED ORDER — BISACODYL 10 MG RE SUPP: 10.0000 mg | Freq: Every day | RECTAL | Status: DC | PRN

## 2012-01-27 MED ORDER — RIVAROXABAN 10 MG PO TABS: 10.0000 mg | ORAL_TABLET | Freq: Every day | ORAL | Status: DC

## 2012-01-27 MED ORDER — TRAMADOL HCL 50 MG PO TABS: 50.0000 mg | ORAL_TABLET | Freq: Four times a day (QID) | ORAL | Status: DC | PRN

## 2012-01-27 MED ORDER — METHOCARBAMOL 500 MG PO TABS: 500.0000 mg | ORAL_TABLET | Freq: Four times a day (QID) | ORAL | Status: DC | PRN

## 2012-01-27 MED ORDER — ONDANSETRON HCL 4 MG PO TABS: 4.0000 mg | ORAL_TABLET | Freq: Four times a day (QID) | ORAL | Status: DC | PRN

## 2012-01-27 MED ORDER — DSS 100 MG PO CAPS: 100.0000 mg | ORAL_CAPSULE | Freq: Two times a day (BID) | ORAL | Status: DC

## 2012-01-27 MED ORDER — POLYETHYLENE GLYCOL 3350 17 G PO PACK: 17.0000 g | PACK | Freq: Every day | ORAL | Status: DC | PRN

## 2012-01-27 MED ORDER — OXYCODONE HCL 5 MG PO TABS: 5.0000 mg | ORAL_TABLET | ORAL | Status: DC | PRN

## 2012-01-27 MED ORDER — POLYSACCHARIDE IRON COMPLEX 150 MG PO CAPS: 150.0000 mg | ORAL_CAPSULE | Freq: Every day | ORAL | Status: DC

## 2012-01-27 NOTE — Progress Notes (Signed)
Physical Therapy Treatment Patient Details Name: Glenda Terry MRN: [id number] DOB: 1941/02/20 Today's Date: 01/27/2012 Time: 4098-1191 PT Time Calculation (min): 28 min  PT Assessment / Plan / Recommendation Comments on Treatment Session  continuing to progress well. Plans to d/c to snf today for continued rehab.     Follow Up Recommendations  SNF     Does the patient have the potential to tolerate intense rehabilitation     Barriers to Discharge        Equipment Recommendations  Rolling walker with 5" wheels;3 in 1 bedside comode    Recommendations for Other Services OT consult  Frequency 7X/week   Plan Discharge plan remains appropriate    Precautions / Restrictions Precautions Precautions: Knee Required Braces or Orthoses: Knee Immobilizer - Right Knee Immobilizer - Right: Discontinue once straight leg raise with < 10 degree lag Restrictions Weight Bearing Restrictions: No RLE Weight Bearing: Weight bearing as tolerated   Pertinent Vitals/Pain 5/10 R knee    Mobility  Transfers Transfers: Sit to Stand;Stand to Sit Sit to Stand: 4: Min assist;From chair/3-in-1;With armrests Stand to Sit: 4: Min assist;To chair/3-in-1;With armrests Details for Transfer Assistance: VCs safety, technique, hand placement. assist to rise, stabilize, control descent.  Ambulation/Gait Ambulation/Gait Assistance: 4: Min guard Ambulation Distance (Feet): 80 Feet Assistive device: Rolling walker Ambulation/Gait Assistance Details: VCs safety, sequence, stepl length, distance from RW.  Gait Pattern: Step-to pattern;Trunk flexed;Decreased stride length;Decreased step length - right;Decreased step length - left    Exercises Total Joint Exercises Ankle Circles/Pumps: AROM;Both;Supine Quad Sets: AROM;10 reps;Supine;Right Heel Slides: AAROM;Right;10 reps;Supine Hip ABduction/ADduction: AAROM;Right;10 reps;Supine Straight Leg Raises: AAROM;Right;10 reps;Supine   PT Diagnosis:    PT  Problem List:   PT Treatment Interventions:     PT Goals Acute Rehab PT Goals Pt will go Sit to Stand: with supervision PT Goal: Sit to Stand - Progress: Progressing toward goal Pt will Ambulate: 51 - 150 feet;with supervision PT Goal: Ambulate - Progress: Progressing toward goal  Visit Information  Last PT Received On: 01/27/12 Assistance Needed: +1    Subjective Data  Subjective: "Im ready. I'm doing good now." Patient Stated Goal: Rehab   Cognition  Overall Cognitive Status: Appears within functional limits for tasks assessed/performed Arousal/Alertness: Awake/Terry Orientation Level: Appears intact for tasks assessed Behavior During Session: [place] for tasks performed    Balance     End of Session PT - End of Session Equipment Utilized During Treatment: Right knee immobilizer Activity Tolerance: Patient tolerated treatment well Patient left: in chair;with call bell/phone within reach;with family/visitor present   GP     Glenda Terry [facility] 01/27/2012, 9:39 AM [phone]

## 2012-01-27 NOTE — Progress Notes (Signed)
   Subjective: 3 Days Post-Op Procedure(s) (LRB): TOTAL KNEE ARTHROPLASTY (Right) Patient reports pain as mild.   Patient seen in rounds by Dr. Lequita Halt.  Patient is doing great this morning. Patient is well, and has had no acute complaints or problems Patient is ready to go to the SNF today.  Objective: Vital signs in last 24 hours: Temp:  [98.3 F (36.8 C)-99.7 F (37.6 C)] 98.4 F (36.9 C) (11/14 0603) Pulse Rate:  [99-110] 110  (11/14 0603) Resp:  [16] 16  (11/13 1341) BP: (95-105)/(69-79) 95/69 mmHg (11/14 0603) SpO2:  [95 %-97 %] 97 % (11/14 0603)  Intake/Output from previous day:  Intake/Output Summary (Last 24 hours) at 01/27/12 0744 Last data filed at 01/27/12 0604  Gross per 24 hour  Intake    450 ml  Output   1075 ml  Net   -625 ml    Intake/Output this shift:    Labs:  Basename 01/27/12 0459 01/26/12 0440 01/25/12 0509  HGB 9.2* 10.6* 10.2*    Basename 01/27/12 0459 01/26/12 0440  WBC 12.2* 14.8*  RBC 3.72* 4.36  HCT 27.9* 33.0*  PLT 244 260    Basename 01/27/12 0459 01/26/12 0440  NA 125* 125*  K 3.7 4.0  CL 92* 89*  CO2 24 26  BUN 12 9  CREATININE 1.27* 1.12*  GLUCOSE 121* 119*  CALCIUM 8.7 9.1   No results found for this basename: LABPT:2,INR:2 in the last 72 hours  EXAM: General - Patient is Alert, Appropriate and Oriented Extremity - Neurovascular intact Sensation intact distally Dorsiflexion/Plantar flexion intact No cellulitis present Incision - clean, dry, no drainage, healing Motor Function - intact, moving foot and toes well on exam.   Assessment/Plan: 3 Days Post-Op Procedure(s) (LRB): TOTAL KNEE ARTHROPLASTY (Right) Procedure(s) (LRB): TOTAL KNEE ARTHROPLASTY (Right) Past Medical History  Diagnosis Date  . COPD (chronic obstructive pulmonary disease)   . Back pain, chronic   . Anxiety   . Hypertension   . Arthritis   . Shortness of breath   . GERD (gastroesophageal reflux disease)    Principal Problem:  *OA  (osteoarthritis) of knee Active Problems:  Postop Hyponatremia  Postop Acute blood loss anemia  Estimated Body mass index is 38.28 kg/(m^2) as calculated from the following:   Height as of this encounter: 5\' 0" (1.524 m).   Weight as of this encounter: 195 lb 15.8 oz(88.9 kg). Discharge to SNF Diet - Cardiac diet Follow up - in 2 weeks Activity - WBAT Disposition - Skilled nursing facility Condition Upon Discharge - Good D/C Meds - See DC Summary DVT Prophylaxis - Xarelto  [name], [name] 01/27/2012, 7:44 AM

## 2012-01-27 NOTE — Progress Notes (Signed)
Report called to Kathie Rhodes, Charity fundraiser at [facility]. Questions answered, and pt to be transported via [facility] upon their arrival (scheduled for 1145). Roann Merk [place], [place] 01/27/2012 11:37 AM

## 2012-01-27 NOTE — Progress Notes (Signed)
Clinical Social Work Department CLINICAL SOCIAL WORK PLACEMENT NOTE 01/27/2012  Patient:  Glenda Terry, Glenda Terry  Account Number:  [id number] Admit date:  01/24/2012  Clinical Social Worker:  Cori Razor, LCSW  Date/time:  01/25/2012 03:19 PM  Clinical Social Work is seeking post-discharge placement for this patient at the following level of care:   SKILLED NURSING   (*CSW will update this form in Epic as items are completed)   01/25/2012  Patient/family provided with [facility] Department of Clinical Social Work's list of facilities offering this level of care within the geographic area requested by the patient (or if unable, by the patient's family).  01/25/2012  Patient/family informed of their freedom to choose among providers that offer the needed level of care, that participate in Medicare, Medicaid or managed care program needed by the patient, have an available bed and are willing to accept the patient.  01/25/2012  Patient/family informed of [facility]' ownership interest in [facility], as well as of the fact that they are under no obligation to receive care at this facility.  PASARR submitted to E[employer] on 01/25/2012 PASARR number received from [employer] on 01/25/2012  FL2 transmitted to all facilities in geographic area requested by pt/family on  01/25/2012 FL2 transmitted to all facilities within larger geographic area on   Patient informed that his/her managed care company has contracts with or will negotiate with  certain facilities, including the following:     Patient/family informed of bed offers received:  01/25/2012 Patient chooses bed at [facility] Physician recommends and patient chooses bed at    Patient to be transferred to [facility] on  01/27/2012 Patient to be transferred to facility by [facility]  The following physician request were entered in Epic:   Additional Comments:  Cori Razor LCSW [phone]

## 2012-01-27 NOTE — Discharge Summary (Signed)
Physician Discharge Summary   Patient ID: Glenda Terry MRN: [id number] DOB/AGE: 11-18-1940 71 y.o.  Admit date: 01/24/2012 Discharge date: 01/27/2012  Primary Diagnosis: Osteoarthritis Right knee   Admission Diagnoses:  Past Medical History  Diagnosis Date  . COPD (chronic obstructive pulmonary disease)   . Back pain, chronic   . Anxiety   . Hypertension   . Arthritis   . Shortness of breath   . GERD (gastroesophageal reflux disease)    Discharge Diagnoses:   Principal Problem:  *OA (osteoarthritis) of knee Active Problems:  Postop Hyponatremia  Postop Acute blood loss anemia  Estimated Body mass index is 38.28 kg/(m^2) as calculated from the following:   Height as of this encounter: 5\' 0" (1.524 m).   Weight as of this encounter: 195 lb 15.8 oz(88.9 kg).  Classification of overweight in adults according to BMI (WHO, 1998)   Procedure:  Procedure(s) (LRB): TOTAL KNEE ARTHROPLASTY (Right)   Consults: None  HPI: Glenda Terry is a 71 y.o. year old female with end stage OA of her right knee with progressively worsening pain and dysfunction. She has constant pain, with activity and at rest and significant functional deficits with difficulties even with ADLs. She has had extensive non-op management including analgesics, injections of cortisone and viscosupplements, and home exercise program, but remains in significant pain with significant dysfunction.Radiographs show bone on bone arthritis all 3 compartments. She presents now for right Total Knee Arthroplasty.   Laboratory Data: Admission on 01/24/2012  Component Date Value Range Status  . ABO/RH(D) 01/24/2012 B POS   Final  . Antibody Screen 01/24/2012 NEG   Final  . Sample Expiration 01/24/2012 01/27/2012   Final  . ABO/RH(D) 01/24/2012 B POS   Final  . WBC 01/25/2012 10.8* 4.0 - 10.5 K/uL Final  . RBC 01/25/2012 4.20  3.87 - 5.11 MIL/uL Final  . Hemoglobin 01/25/2012 10.2* 12.0 - 15.0 g/dL Final  . HCT  40/98/1191 32.6* 36.0 - 46.0 % Final  . MCV 01/25/2012 77.6* 78.0 - 100.0 fL Final  . MCH 01/25/2012 24.3* 26.0 - 34.0 pg Final  . MCHC 01/25/2012 31.3  30.0 - 36.0 g/dL Final  . RDW 47/82/9562 15.0  11.5 - 15.5 % Final  . Platelets 01/25/2012 239  150 - 400 K/uL Final  . Sodium 01/25/2012 133* 135 - 145 mEq/L Final  . Potassium 01/25/2012 4.2  3.5 - 5.1 mEq/L Final  . Chloride 01/25/2012 97  96 - 112 mEq/L Final  . CO2 01/25/2012 29  19 - 32 mEq/L Final  . Glucose, Bld 01/25/2012 122* 70 - 99 mg/dL Final  . BUN 13/10/6576 9  6 - 23 mg/dL Final  . Creatinine, Ser 01/25/2012 1.07  0.50 - 1.10 mg/dL Final  . Calcium 46/96/2952 8.8  8.4 - 10.5 mg/dL Final  . GFR calc non Af Amer 01/25/2012 52* >90 mL/min Final  . GFR calc Af Amer 01/25/2012 60* >90 mL/min Final   Comment:                                 The eGFR has been calculated                          using the CKD EPI equation.                          This calculation  has not been                          validated in all clinical                          situations.                          eGFR's persistently                          <90 mL/min signify                          possible Chronic Kidney Disease.  . WBC 01/26/2012 14.8* 4.0 - 10.5 K/uL Final  . RBC 01/26/2012 4.36  3.87 - 5.11 MIL/uL Final  . Hemoglobin 01/26/2012 10.6* 12.0 - 15.0 g/dL Final  . HCT 16/12/9602 33.0* 36.0 - 46.0 % Final  . MCV 01/26/2012 75.7* 78.0 - 100.0 fL Final  . MCH 01/26/2012 24.3* 26.0 - 34.0 pg Final  . MCHC 01/26/2012 32.1  30.0 - 36.0 g/dL Final  . RDW 54/11/8117 14.7  11.5 - 15.5 % Final  . Platelets 01/26/2012 260  150 - 400 K/uL Final  . Sodium 01/26/2012 125* 135 - 145 mEq/L Final   Comment: REPEATED TO VERIFY                          DELTA CHECK NOTED  . Potassium 01/26/2012 4.0  3.5 - 5.1 mEq/L Final  . Chloride 01/26/2012 89* 96 - 112 mEq/L Final   Comment: REPEATED TO VERIFY                          DELTA CHECK NOTED  .  CO2 01/26/2012 26  19 - 32 mEq/L Final  . Glucose, Bld 01/26/2012 119* 70 - 99 mg/dL Final  . BUN 14/78/2956 9  6 - 23 mg/dL Final  . Creatinine, Ser 01/26/2012 1.12* 0.50 - 1.10 mg/dL Final  . Calcium 21/30/8657 9.1  8.4 - 10.5 mg/dL Final  . GFR calc non Af Amer 01/26/2012 49* >90 mL/min Final  . GFR calc Af Amer 01/26/2012 57* >90 mL/min Final   Comment:                                 The eGFR has been calculated                          using the CKD EPI equation.                          This calculation has not been                          validated in all clinical                          situations.                          eGFR's persistently                          <  90 mL/min signify                          possible Chronic Kidney Disease.  . WBC 01/27/2012 12.2* 4.0 - 10.5 K/uL Final  . RBC 01/27/2012 3.72* 3.87 - 5.11 MIL/uL Final  . Hemoglobin 01/27/2012 9.2* 12.0 - 15.0 g/dL Final  . HCT 16/12/9602 27.9* 36.0 - 46.0 % Final  . MCV 01/27/2012 75.0* 78.0 - 100.0 fL Final  . MCH 01/27/2012 24.7* 26.0 - 34.0 pg Final  . MCHC 01/27/2012 33.0  30.0 - 36.0 g/dL Final  . RDW 54/11/8117 14.7  11.5 - 15.5 % Final  . Platelets 01/27/2012 244  150 - 400 K/uL Final  . Sodium 01/27/2012 125* 135 - 145 mEq/L Final  . Potassium 01/27/2012 3.7  3.5 - 5.1 mEq/L Final  . Chloride 01/27/2012 92* 96 - 112 mEq/L Final  . CO2 01/27/2012 24  19 - 32 mEq/L Final  . Glucose, Bld 01/27/2012 121* 70 - 99 mg/dL Final  . BUN 14/78/2956 12  6 - 23 mg/dL Final  . Creatinine, Ser 01/27/2012 1.27* 0.50 - 1.10 mg/dL Final  . Calcium 21/30/8657 8.7  8.4 - 10.5 mg/dL Final  . GFR calc non Af Amer 01/27/2012 42* >90 mL/min Final  . GFR calc Af Amer 01/27/2012 49* >90 mL/min Final   Comment:                                 The eGFR has been calculated                          using the CKD EPI equation.                          This calculation has not been                          validated in all  clinical                          situations.                          eGFR's persistently                          <90 mL/min signify                          possible Chronic Kidney Disease.  Hospital Outpatient Visit on 01/19/2012  Component Date Value Range Status  . MRSA, PCR 01/19/2012 NEGATIVE  NEGATIVE Final  . Staphylococcus aureus 01/19/2012 NEGATIVE  NEGATIVE Final   Comment:                                 The Xpert SA Assay (FDA                          approved for NASAL specimens                          in patients  over 87 years of age),                          is one component of                          a comprehensive surveillance                          program.  Test performance has                          been validated by [employer] for patients greater                          than or equal to 36 year old.                          It is not intended                          to diagnose infection nor to                          guide or monitor treatment.  Marland Kitchen aPTT 01/19/2012 25  24 - 37 seconds Final  . WBC 01/19/2012 11.7* 4.0 - 10.5 K/uL Final  . RBC 01/19/2012 4.42  3.87 - 5.11 MIL/uL Final  . Hemoglobin 01/19/2012 10.5* 12.0 - 15.0 g/dL Final  . HCT 04/54/0981 34.1* 36.0 - 46.0 % Final  . MCV 01/19/2012 77.1* 78.0 - 100.0 fL Final  . MCH 01/19/2012 23.8* 26.0 - 34.0 pg Final  . MCHC 01/19/2012 30.8  30.0 - 36.0 g/dL Final  . RDW 19/14/7829 15.0  11.5 - 15.5 % Final  . Platelets 01/19/2012 312  150 - 400 K/uL Final  . Sodium 01/19/2012 133* 135 - 145 mEq/L Final  . Potassium 01/19/2012 4.2  3.5 - 5.1 mEq/L Final  . Chloride 01/19/2012 97  96 - 112 mEq/L Final  . CO2 01/19/2012 29  19 - 32 mEq/L Final  . Glucose, Bld 01/19/2012 84  70 - 99 mg/dL Final  . BUN 56/21/3086 16  6 - 23 mg/dL Final  . Creatinine, Ser 01/19/2012 1.24* 0.50 - 1.10 mg/dL Final  . Calcium 57/84/6962 9.3  8.4 - 10.5 mg/dL Final  . Total Protein  01/19/2012 6.8  6.0 - 8.3 g/dL Final  . Albumin 95/28/4132 3.3* 3.5 - 5.2 g/dL Final  . AST 44/03/270 29  0 - 37 U/L Final  . ALT 01/19/2012 42* 0 - 35 U/L Final  . Alkaline Phosphatase 01/19/2012 79  39 - 117 U/L Final  . Total Bilirubin 01/19/2012 0.2* 0.3 - 1.2 mg/dL Final  . GFR calc non Af Amer 01/19/2012 43* >90 mL/min Final  . GFR calc Af Amer 01/19/2012 50* >90 mL/min Final   Comment:                                 The eGFR has been calculated  using the CKD EPI equation.                          This calculation has not been                          validated in all clinical                          situations.                          eGFR's persistently                          <90 mL/min signify                          possible Chronic Kidney Disease.  Marland Kitchen Prothrombin Time 01/19/2012 12.1  11.6 - 15.2 seconds Final  . INR 01/19/2012 0.90  0.00 - 1.49 Final  . Color, Urine 01/19/2012 YELLOW  YELLOW Final  . APPearance 01/19/2012 CLEAR  CLEAR Final  . Specific Gravity, Urine 01/19/2012 1.015  1.005 - 1.030 Final  . pH 01/19/2012 6.5  5.0 - 8.0 Final  . Glucose, UA 01/19/2012 NEGATIVE  NEGATIVE mg/dL Final  . Hgb urine dipstick 01/19/2012 NEGATIVE  NEGATIVE Final  . Bilirubin Urine 01/19/2012 NEGATIVE  NEGATIVE Final  . Ketones, ur 01/19/2012 NEGATIVE  NEGATIVE mg/dL Final  . Protein, ur 16/12/9602 NEGATIVE  NEGATIVE mg/dL Final  . Urobilinogen, UA 01/19/2012 0.2  0.0 - 1.0 mg/dL Final  . Nitrite 54/11/8117 NEGATIVE  NEGATIVE Final  . Leukocytes, UA 01/19/2012 NEGATIVE  NEGATIVE Final   MICROSCOPIC NOT DONE ON URINES WITH NEGATIVE PROTEIN, BLOOD, LEUKOCYTES, NITRITE, OR GLUCOSE <1000 mg/dL.     X-Rays:Dg Chest 2 View  01/19/2012  *RADIOLOGY REPORT*  Clinical Data: Bronchitis  CHEST - 2 VIEW  Comparison: February 08, 2011.  Findings: Cardiomediastinal silhouette appears normal.  No acute pulmonary disease is noted.  Bony thorax is intact.   IMPRESSION: No acute cardiopulmonary abnormality seen.   Original Report Authenticated By: Lupita Raider.,  M.D.     EKG: Orders placed during the hospital encounter of 05/17/11  . ED EKG  . ED EKG  . EKG     Hospital Course:  Glenda Terry is a 71 y.o. who was admitted to [facility]. They were brought to the operating room on 01/24/2012 and underwent Procedure(s): TOTAL KNEE ARTHROPLASTY.  Patient tolerated the procedure well and was later transferred to the recovery room and then to the orthopaedic floor for postoperative care.  They were given PO and IV analgesics for pain control following their surgery.  They were given 24 hours of postoperative antibiotics of  Anti-infectives     Start     Dose/Rate Route Frequency Ordered Stop   01/24/12 2100   ceFAZolin (ANCEF) IVPB 1 g/50 mL premix        1 g 100 mL/hr over 30 Minutes Intravenous Every 6 hours 01/24/12 1733 01/25/12 0351   01/24/12 1312   ceFAZolin (ANCEF) IVPB 2 g/50 mL premix        2 g 100 mL/hr over 30 Minutes Intravenous 30 min pre-op 01/24/12 1312 01/24/12 1442         and started on DVT prophylaxis  in the form of Xarelto.   PT and OT were ordered for total joint protocol.  Discharge planning consulted to help with postop disposition and equipment needs.  Patient had a good night on the evening of surgery and started to get up OOB with therapy on day one and walked about 8 feet.   Hemovac drain was pulled without difficulty.  Continued to work with therapy into day two and did much better.  Dressing was changed on day two and the incision was healing well.  By day three, the patient had progressed with therapy and meeting their goals.  Incision was healing well.  Patient was seen in rounds and was ready to go to the SNF.   Discharge Medications: Prior to Admission medications   Medication Sig Start Date End Date Taking? Authorizing Provider  albuterol (PROAIR HFA) 108 (90 BASE) MCG/ACT inhaler Inhale 2  puffs into the lungs every 6 (six) hours as needed. For COPD   Yes Historical Provider, MD  ALPRAZolam Prudy Feeler) 0.5 MG tablet Take 0.5 mg by mouth every 8 (eight) hours as needed. For anxiety   Yes Historical Provider, MD  beclomethasone (QVAR) 80 MCG/ACT inhaler Inhale 2 puffs into the lungs 2 (two) times daily.    Yes Historical Provider, MD  diphenhydrAMINE (BENADRYL) 25 MG tablet Take 25 mg by mouth at bedtime as needed. For allergies   Yes Historical Provider, MD  esomeprazole (NEXIUM) 40 MG capsule Take 40 mg by mouth daily before breakfast.    Yes Historical Provider, MD  fluticasone (FLONASE) 50 MCG/ACT nasal spray Place 2 sprays into the nose daily.   Yes Historical Provider, MD  loratadine (CLARITIN) 10 MG tablet Take 10 mg by mouth daily.   Yes Historical Provider, MD  montelukast (SINGULAIR) 10 MG tablet Take 10 mg by mouth at bedtime.   Yes Historical Provider, MD  solifenacin (VESICARE) 5 MG tablet Take 10 mg by mouth daily.   Yes Historical Provider, MD  telmisartan-hydrochlorothiazide (MICARDIS HCT) 80-12.5 MG per tablet Take 1 tablet by mouth every morning.    Yes Historical Provider, MD  acetaminophen (TYLENOL) 325 MG tablet Take 2 tablets (650 mg total) by mouth every 6 (six) hours as needed (or Fever >/= 101). 01/27/12   Dewain Platz Julien Girt, PA  bisacodyl (DULCOLAX) 10 MG suppository Place 1 suppository (10 mg total) rectally daily as needed. 01/27/12   Annamary Buschman, PA  cefUROXime (CEFTIN) 500 MG tablet Take 500 mg by mouth 2 (two) times daily.    Historical Provider, MD  docusate sodium 100 MG CAPS Take 100 mg by mouth 2 (two) times daily. 01/27/12   Amiya Escamilla, PA  EPINEPHrine (EPIPEN 2-PAK) 0.3 mg/0.3 mL DEVI Inject 0.3 mg into the muscle once. As needed for tree nut allergy    Historical Provider, MD  iron polysaccharides (NIFEREX) 150 MG capsule Take 1 capsule (150 mg total) by mouth daily. 01/27/12   Zeppelin Commisso Julien Girt, PA  methocarbamol (ROBAXIN) 500 MG  tablet Take 1 tablet (500 mg total) by mouth every 6 (six) hours as needed. 01/27/12   Edrees Valent, PA  ondansetron (ZOFRAN) 4 MG tablet Take 1 tablet (4 mg total) by mouth every 6 (six) hours as needed for nausea. 01/27/12   Johnnay Pleitez, PA  oxyCODONE (OXY IR/ROXICODONE) 5 MG immediate release tablet Take 1-2 tablets (5-10 mg total) by mouth every 3 (three) hours as needed. 01/27/12   Roverto Bodmer, PA  polyethylene glycol (MIRALAX / GLYCOLAX) packet Take 17 g by mouth daily  as needed. 01/27/12   Ivette Castronova, PA  predniSONE (DELTASONE) 20 MG tablet Take 20 mg by mouth daily.    Historical Provider, MD  rivaroxaban (XARELTO) 10 MG TABS tablet Take 1 tablet (10 mg total) by mouth daily with breakfast. Take Xarelto for two and a half more weeks, then discontinue Xarelto. 01/27/12   Amiel Mccaffrey Julien Girt, PA  traMADol (ULTRAM) 50 MG tablet Take 1-2 tablets (50-100 mg total) by mouth every 6 (six) hours as needed for pain (mild pain). 01/27/12   Meeka Cartelli Julien Girt, PA    Diet: Cardiac diet Activity:WBAT Follow-up:in 2 weeks Disposition - Skilled nursing facility Discharged Condition: good   Discharge Orders    Future Orders Please Complete By Expires   Diet - low sodium heart healthy      Call MD / Call 911      Comments:   If you experience chest pain or shortness of breath, CALL 911 and be transported to the hospital emergency room.  If you develope a fever above 101 F, pus (white drainage) or increased drainage or redness at the wound, or calf pain, call your surgeon's office.   Discharge instructions      Comments:   Pick up stool softner and laxative for home. Do not submerge incision under water. May shower. Continue to use ice for pain and swelling from surgery. Hip precautions.  Total Hip Protocol.  Take Xarelto for two and a half more weeks, then discontinue Xarelto.  When discharged from the skilled rehab facility, please have the facility set up  the patient's Home Health Physical Therapy prior to being released.  Also provide the patient with their medications at time of release from the facility to include their pain medication, the muscle relaxants, and their blood thinner medication.  If the patient is still at the rehab facility at time of follow up appointment, please also assist the patient in arranging follow up appointment in our office and any transportation needs.     Constipation Prevention      Comments:   Drink plenty of fluids.  Prune juice may be helpful.  You may use a stool softener, such as Colace (over the counter) 100 mg twice a day.  Use MiraLax (over the counter) for constipation as needed.   Increase activity slowly as tolerated      Patient may shower      Comments:   You may shower without a dressing once there is no drainage.  Do not wash over the wound.  If drainage remains, do not shower until drainage stops.   Weight bearing as tolerated      Driving restrictions      Comments:   No driving until released by the physician.   Lifting restrictions      Comments:   No lifting until released by the physician.   TED hose      Comments:   Use stockings (TED hose) for 3 weeks on both leg(s).  You may remove them at night for sleeping.   Change dressing      Comments:   Change dressing daily with sterile 4 x 4 inch gauze dressing and apply TED hose. Do not submerge the incision under water.   Do not put a pillow under the knee. Place it under the heel.      Do not sit on low chairs, stoools or toilet seats, as it may be difficult to get up from low surfaces  Medication List     As of 01/27/2012  7:54 AM    STOP taking these medications         oxyCODONE-acetaminophen 5-325 MG per tablet   Commonly known as: PERCOCET/ROXICET      promethazine 25 MG suppository   Commonly known as: PHENERGAN      promethazine 25 MG tablet   Commonly known as: PHENERGAN      TAKE these medications           acetaminophen 325 MG tablet   Commonly known as: TYLENOL   Take 2 tablets (650 mg total) by mouth every 6 (six) hours as needed (or Fever >/= 101).      ALPRAZolam 0.5 MG tablet   Commonly known as: XANAX   Take 0.5 mg by mouth every 8 (eight) hours as needed. For anxiety      beclomethasone 80 MCG/ACT inhaler   Commonly known as: QVAR   Inhale 2 puffs into the lungs 2 (two) times daily.      bisacodyl 10 MG suppository   Commonly known as: DULCOLAX   Place 1 suppository (10 mg total) rectally daily as needed.      cefUROXime 500 MG tablet   Commonly known as: CEFTIN   Take 500 mg by mouth 2 (two) times daily.      diphenhydrAMINE 25 MG tablet   Commonly known as: BENADRYL   Take 25 mg by mouth at bedtime as needed. For allergies      DSS 100 MG Caps   Take 100 mg by mouth 2 (two) times daily.      EPIPEN 2-PAK 0.3 mg/0.3 mL Devi   Generic drug: EPINEPHrine   Inject 0.3 mg into the muscle once. As needed for tree nut allergy      esomeprazole 40 MG capsule   Commonly known as: NEXIUM   Take 40 mg by mouth daily before breakfast.      fluticasone 50 MCG/ACT nasal spray   Commonly known as: FLONASE   Place 2 sprays into the nose daily.      iron polysaccharides 150 MG capsule   Commonly known as: NIFEREX   Take 1 capsule (150 mg total) by mouth daily.      loratadine 10 MG tablet   Commonly known as: CLARITIN   Take 10 mg by mouth daily.      methocarbamol 500 MG tablet   Commonly known as: ROBAXIN   Take 1 tablet (500 mg total) by mouth every 6 (six) hours as needed.      montelukast 10 MG tablet   Commonly known as: SINGULAIR   Take 10 mg by mouth at bedtime.      ondansetron 4 MG tablet   Commonly known as: ZOFRAN   Take 1 tablet (4 mg total) by mouth every 6 (six) hours as needed for nausea.      oxyCODONE 5 MG immediate release tablet   Commonly known as: Oxy IR/ROXICODONE   Take 1-2 tablets (5-10 mg total) by mouth every 3 (three) hours as needed.       polyethylene glycol packet   Commonly known as: MIRALAX / GLYCOLAX   Take 17 g by mouth daily as needed.      predniSONE 20 MG tablet   Commonly known as: DELTASONE   Take 20 mg by mouth daily.      PROAIR HFA 108 (90 BASE) MCG/ACT inhaler   Generic drug: albuterol   Inhale 2 puffs into  the lungs every 6 (six) hours as needed. For COPD      rivaroxaban 10 MG Tabs tablet   Commonly known as: XARELTO   Take 1 tablet (10 mg total) by mouth daily with breakfast. Take Xarelto for two and a half more weeks, then discontinue Xarelto.      solifenacin 5 MG tablet   Commonly known as: VESICARE   Take 10 mg by mouth daily.      telmisartan-hydrochlorothiazide 80-12.5 MG per tablet   Commonly known as: MICARDIS HCT   Take 1 tablet by mouth every morning.      traMADol 50 MG tablet   Commonly known as: ULTRAM   Take 1-2 tablets (50-100 mg total) by mouth every 6 (six) hours as needed for pain (mild pain).           Follow-up Information    Follow up with Loanne Drilling, MD. Schedule an appointment as soon as possible for a visit in 2 weeks.   Contact information:   [address] [place] [address] [phone]          Signed: Patrica Duel 01/27/2012, 7:54 AM

## 2012-03-06 ENCOUNTER — Other Ambulatory Visit (HOSPITAL_COMMUNITY): Payer: Self-pay | Admitting: Internal Medicine

## 2012-03-06 DIAGNOSIS — Z139 Encounter for screening, unspecified: Secondary | ICD-10-CM

## 2012-03-13 ENCOUNTER — Ambulatory Visit (HOSPITAL_COMMUNITY)
Admission: RE | Admit: 2012-03-13 | Discharge: 2012-03-13 | Disposition: A | Discharge status: Home / Self Care | Payer: Medicare Other | Source: Ambulatory Visit | Attending: Orthopedic Surgery | Admitting: Orthopedic Surgery

## 2012-03-13 DIAGNOSIS — M25569 Pain in unspecified knee: Secondary | ICD-10-CM | POA: Insufficient documentation

## 2012-03-13 DIAGNOSIS — I10 Essential (primary) hypertension: Secondary | ICD-10-CM | POA: Insufficient documentation

## 2012-03-13 DIAGNOSIS — M25669 Stiffness of unspecified knee, not elsewhere classified: Secondary | ICD-10-CM | POA: Insufficient documentation

## 2012-03-13 DIAGNOSIS — R262 Difficulty in walking, not elsewhere classified: Secondary | ICD-10-CM | POA: Insufficient documentation

## 2012-03-13 DIAGNOSIS — IMO0001 Reserved for inherently not codable concepts without codable children: Secondary | ICD-10-CM | POA: Insufficient documentation

## 2012-03-13 DIAGNOSIS — M6281 Muscle weakness (generalized): Secondary | ICD-10-CM | POA: Insufficient documentation

## 2012-03-13 DIAGNOSIS — R29898 Other symptoms and signs involving the musculoskeletal system: Secondary | ICD-10-CM | POA: Insufficient documentation

## 2012-03-13 NOTE — Evaluation (Signed)
Physical Therapy Evaluation  Patient Details  Name: Glenda Terry MRN: [id number] Date of Birth: October 28, 1940  Today's Date: 03/13/2012 Time: 4098-1191 PT Time Calculation (min): 52 min  Visit#: 1  of 12   Re-eval: 04/12/12 Assessment Diagnosis: R TKR (R TKR) Surgical Date: 01/24/12 Next MD Visit:  (04/12/11) Prior Therapy: SNF; HH  Authorization: medicare  Authorization Time Period:    Authorization Visit#: 1  of 10    Past Medical History:  Past Medical History  Diagnosis Date  . COPD (chronic obstructive pulmonary disease)   . Back pain, chronic   . Anxiety   . Hypertension   . Arthritis   . Shortness of breath   . GERD (gastroesophageal reflux disease)    Past Surgical History:  Past Surgical History  Procedure Date  . Hysterotomy   . Tubal ligation   . Abdominal hysterectomy   . Knee arthroscopy   . Total knee arthroplasty 01/24/2012    Procedure: TOTAL KNEE ARTHROPLASTY;  Surgeon: Loanne Drilling, MD;  Location: [facility];  Service: Orthopedics;  Laterality: Right;    Subjective Symptoms/Limitations Symptoms: [name] states that she had her TKR on her right knee on 11/ 11/ 13.  She was D/C from the hospital to the [facility].  She was discharged to  [facility] services on the 27tth.  She is now being referred to out patient services to return her to her previous level of function.  She states she is having difficulty with steps, getting in and out of the car, doing her housework and getting down into the bathtub, Pertinent History: chronic stenosis, (neurosurgeon is considering surgery pt states her pain is just like labor pain.) How long can you sit comfortably?: Able to sit for 30 minutes  How long can you stand comfortably?: able to stand for only a short period of time but this is due to her back pain not her knee pain. How long can you walk comfortably?: Currently the patient is only walking to the mailbox and back with her cane, (less than five  minutes). Special Tests: Pt states she is waking up 4-5 times a night. Pain Assessment Currently in Pain?: Yes Pain Score:   3 Pain Location: Knee Pain Orientation: Right Pain Type: Surgical pain Pain Onset: More than a month ago Multiple Pain Sites: Yes   Prior Functioning  Home Living Lives With: Alone Type of Home: House Home Access: Stairs to enter Entrance Stairs-Number of Steps: 5 Entrance Stairs-Rails: None Home Layout: One level Prior Function Driving: Yes Leisure: Hobbies-yes (Comment) Comments: walking   Cognition/Observation Cognition Overall Cognitive Status: Appears within functional limits for tasks assessed  Sensation/Coordination/Flexibility/Functional Tests Functional Tests Functional Tests: LEFS  26/80  Assessment RLE AROM (degrees) Right Knee Extension: 14  Right Knee Flexion: 92  RLE PROM (degrees) Right Knee Extension: 12  Right Knee Flexion: 95  RLE Strength Right Hip Flexion: 5/5 Right Hip Extension: 3-/5 Right Hip ABduction: 3+/5 Right Knee Flexion: 3+/5 Right Knee Extension:  (4-/5) Right Ankle Dorsiflexion: 3/5  Exercise/Treatments   Stretches Active Hamstring Stretch: 3 reps;30 seconds   Seated Long Arc Quad: 5 reps Supine Quad Sets: 10 reps Heel Slides: 5 reps Terminal Knee Extension: 10 reps Knee Extension: PROM Knee Flexion: PROM Sidelying Hip ABduction: 10 reps Prone  Hamstring Curl: 10 reps Straight Leg Raises: 5 reps    Physical Therapy Assessment and Plan PT Assessment and Plan Clinical Impression Statement: Pt s/p TKR who has decreased ROM, strength, increased pain and  decreased mobility who will benefit from skilled PT to return pt to maximal functional ability. Pt will benefit from skilled therapeutic intervention in order to improve on the following deficits: Decreased balance;Decreased mobility;Decreased range of motion;Increased edema;Decreased strength;Difficulty walking;Pain Rehab Potential: Good Clinical  Impairments Affecting Rehab Potential: Pt needs back surgery.  Make sure pt back is not aggrevated PT Frequency: Min 3X/week PT Duration: 4 weeks PT Treatment/Interventions: Gait training;Therapeutic activities;Therapeutic exercise;Stair training;Manual techniques;Modalities PT Plan: begin bike, heelraises, minisquats rockerboard, standing knee flexsion and terminal extension;  3rd treatment add lateral and forward step ups.    Goals Home Exercise Program Pt will Perform Home Exercise Program: Independently PT Short Term Goals PT Short Term Goal 1: Pt to be walking for 15 minutes at a time PT Short Term Goal 2: Pt ROM to improve to 8-105 to allow more normalized gt and ease of sitting PT Short Term Goal 3: Strength to be improved 1/2 grade to improve ease of stairclimbing PT Long Term Goals Time to Complete Long Term Goals: 4 weeks PT Long Term Goal 1: Pt to be able to walk without her cane PT Long Term Goal 2: Pt strength to be increased by one grade to allow reciprocal stair climbing Long Term Goal 3: Pt ROM to be 5- 115 degrees to allow normalize walking and ease of getting in and out of her car.  Long Term Goal 4: Pt to be walking 30 minutes at a time. PT Long Term Goal 5: Pt to wake 2 or less times a night.  Problem List Patient Active Problem List  Diagnosis  . ULCERATION OF INTESTINE  . Anxiety  . HTN (hypertension)  . Arthritis  . GERD (gastroesophageal reflux disease)  . OA (osteoarthritis) of knee  . Postop Hyponatremia  . Postop Acute blood loss anemia  . Difficulty in walking  . Stiffness of joint, not elsewhere classified, lower leg  . Weakness of right leg    PT - End of Session Activity Tolerance: Patient tolerated treatment well General Behavior During Session: [facility] for tasks performed Cognition: [facility] for tasks performed PT Plan of Care PT Home Exercise Plan: given Consulted and Agree with Plan of Care: Patient  GP Functional Assessment Tool Used:  LEFS Functional Limitation: Mobility: Walking and moving around Mobility: Walking and Moving Around Current Status (Z6109): At least 60 percent but less than 80 percent impaired, limited or restricted Mobility: Walking and Moving Around Goal Status [phone]): At least 20 percent but less than 40 percent impaired, limited or restricted  RUSSELL,CINDY 03/13/2012, 12:19 PM  Physician Documentation Your signature is required to indicate approval of the treatment plan as stated above.  Please sign and either send electronically or make a copy of this report for your files and return this physician signed original.   Please mark one 1._x_approve of plan  2. ___approve of plan with the following conditions.   ______________________________                                                          _____________________ Physician Signature  Date  

## 2012-03-16 ENCOUNTER — Ambulatory Visit (HOSPITAL_COMMUNITY)
Admission: RE | Admit: 2012-03-16 | Discharge: 2012-03-16 | Disposition: A | Discharge status: Home / Self Care | Payer: Medicare Other | Source: Ambulatory Visit | Attending: Family Medicine | Admitting: Family Medicine

## 2012-03-16 DIAGNOSIS — M6281 Muscle weakness (generalized): Secondary | ICD-10-CM | POA: Insufficient documentation

## 2012-03-16 DIAGNOSIS — M25569 Pain in unspecified knee: Secondary | ICD-10-CM | POA: Insufficient documentation

## 2012-03-16 DIAGNOSIS — IMO0001 Reserved for inherently not codable concepts without codable children: Secondary | ICD-10-CM | POA: Insufficient documentation

## 2012-03-16 DIAGNOSIS — I10 Essential (primary) hypertension: Secondary | ICD-10-CM | POA: Insufficient documentation

## 2012-03-16 DIAGNOSIS — R262 Difficulty in walking, not elsewhere classified: Secondary | ICD-10-CM

## 2012-03-16 DIAGNOSIS — M25669 Stiffness of unspecified knee, not elsewhere classified: Secondary | ICD-10-CM

## 2012-03-16 DIAGNOSIS — R29898 Other symptoms and signs involving the musculoskeletal system: Secondary | ICD-10-CM

## 2012-03-16 NOTE — Progress Notes (Signed)
Physical Therapy Treatment Patient Details  Name: Glenda Terry MRN: [id number] Date of Birth: [birth year]  Today's Date: 03/16/2012 Time: 4098-1191 PT Time Calculation (min): 43 min Charge:  There ex x 40 Visit#: 2  of 12   Re-eval: 04/12/12   Authorization: medicare  Authorization Time Period:    Authorization Visit#: 1  of 10    Subjective: Symptoms/Limitations Symptoms: Pt states she has been doing her exercises at home. Pain Assessment Currently in Pain?: Yes Pain Score:   3 Pain Location: Knee  Exercise/Treatments   Aerobic Stationary Bike: 6'    Standing Heel Raises: 10 reps Terminal Knee Extension: Strengthening;10 reps Functional Squat: 10 reps Rocker Board: 2 minutes Seated Long Arc Quad: 10 reps Supine Quad Sets: 10 reps Heel Slides: 10 reps Terminal Knee Extension: 10 reps Patellar Mobs: done Knee Extension: PROM Knee Flexion: PROM Sidelying Hip ABduction: 10 reps Prone  Hamstring Curl: 10 reps Straight Leg Raises: 10 reps    Physical Therapy Assessment and Plan PT Assessment and Plan Clinical Impression Statement: Pt added new exercises without difficulty; demonstrating improved ROM. PT Plan: begin lateral and forward step ups.      Problem List Patient Active Problem List  Diagnosis  . ULCERATION OF INTESTINE  . Anxiety  . HTN (hypertension)  . Arthritis  . GERD (gastroesophageal reflux disease)  . OA (osteoarthritis) of knee  . Postop Hyponatremia  . Postop Acute blood loss anemia  . Difficulty in walking  . Stiffness of joint, not elsewhere classified, lower leg  . Weakness of right leg    PT - End of Session Activity Tolerance: Patient tolerated treatment well General Behavior During Session: [facility] for tasks performed Cognition: [facility] for tasks performed  GP    RUSSELL,CINDY 03/16/2012, 11:56 AM

## 2012-03-17 ENCOUNTER — Ambulatory Visit (HOSPITAL_COMMUNITY)
Admission: RE | Admit: 2012-03-17 | Discharge: 2012-03-17 | Disposition: A | Discharge status: Home / Self Care | Payer: Medicare Other | Source: Ambulatory Visit | Attending: Internal Medicine | Admitting: Internal Medicine

## 2012-03-17 DIAGNOSIS — Z1231 Encounter for screening mammogram for malignant neoplasm of breast: Secondary | ICD-10-CM | POA: Insufficient documentation

## 2012-03-17 DIAGNOSIS — Z139 Encounter for screening, unspecified: Secondary | ICD-10-CM

## 2012-03-20 ENCOUNTER — Ambulatory Visit (HOSPITAL_COMMUNITY)
Admission: RE | Admit: 2012-03-20 | Discharge: 2012-03-20 | Disposition: A | Discharge status: Home / Self Care | Payer: Medicare Other | Source: Ambulatory Visit | Attending: Orthopedic Surgery | Admitting: Orthopedic Surgery

## 2012-03-20 DIAGNOSIS — R262 Difficulty in walking, not elsewhere classified: Secondary | ICD-10-CM

## 2012-03-20 DIAGNOSIS — R29898 Other symptoms and signs involving the musculoskeletal system: Secondary | ICD-10-CM

## 2012-03-20 DIAGNOSIS — M25669 Stiffness of unspecified knee, not elsewhere classified: Secondary | ICD-10-CM

## 2012-03-20 NOTE — Progress Notes (Signed)
Physical Therapy Treatment Patient Details  Name: Glenda Terry MRN: [id number] Date of Birth: 07-20-1940  Today's Date: 03/20/2012 Time: 1103-1205 PT Time Calculation (min): 62 min  Visit#: 3  of 12   Re-eval: 04/12/12    Authorization: medicare  Authorization Time Period:    Authorization Visit#: 3  of 10    Subjective: Symptoms/Limitations Symptoms: Pt states that she can tell she is walking better Pain Assessment Pain Score:   5 Pain Location: Knee Pain Orientation: Right Pain Type: Surgical pain   Exercise/Treatments  Aerobic Stationary Bike: 10' (rocking; unable to do a complete revolution)   Standing Heel Raises: 15 reps Knee Flexion: 15 reps Lateral Step Up: 10 reps Forward Step Up: 10 reps Rocker Board: 2 minutes SLS: 5x  Seated Long Arc Quad: 15 reps;Limitations Long Arc Quad Limitations: 5# Supine Quad Sets: 15 reps Short Arc Quad Sets: 15 reps Terminal Knee Extension: 10 reps Knee Extension: PROM Knee Flexion: PROM Sidelying Hip ABduction: 10 reps Prone  Hamstring Curl: 15 reps Straight Leg Raises: 10 reps Other Prone Exercises:           Physical Therapy Assessment and Plan PT Assessment and Plan Clinical Impression Statement: improved ROM; needs to be verbally cued to stand upright during exercises; added wt to LAQ PT Plan: begin stairs in department; gt without cane    Goals Home Exercise Program PT Goal: Perform Home Exercise Program - Progress: Met PT Short Term Goals PT Short Term Goal 1 - Progress: Met PT Short Term Goal 2 - Progress: Met PT Short Term Goal 3 - Progress: Progressing toward goal PT Long Term Goals PT Long Term Goal 1 - Progress: Progressing toward goal PT Long Term Goal 2 - Progress: Progressing toward goal Long Term Goal 3 Progress: Progressing toward goal Long Term Goal 4 Progress: Progressing toward goal Long Term Goal 5 Progress: Progressing toward goal  Problem List Patient Active Problem List    Diagnosis  . ULCERATION OF INTESTINE  . Anxiety  . HTN (hypertension)  . Arthritis  . GERD (gastroesophageal reflux disease)  . OA (osteoarthritis) of knee  . Postop Hyponatremia  . Postop Acute blood loss anemia  . Difficulty in walking  . Stiffness of joint, not elsewhere classified, lower leg  . Weakness of right leg    PT - End of Session Activity Tolerance: Patient tolerated treatment well General Behavior During Session: [facility] for tasks performed  GP    Rodriguez Aguinaldo,CINDY 03/20/2012, 12:18 PM

## 2012-03-22 ENCOUNTER — Ambulatory Visit (HOSPITAL_COMMUNITY)
Admission: RE | Admit: 2012-03-22 | Discharge: 2012-03-22 | Disposition: A | Discharge status: Home / Self Care | Payer: Medicare Other | Source: Ambulatory Visit | Attending: Orthopedic Surgery | Admitting: Orthopedic Surgery

## 2012-03-22 NOTE — Progress Notes (Signed)
Physical Therapy Treatment Patient Details  Name: Glenda Terry MRN: [id number] Date of Birth: 1940/08/26  Today's Date: 03/22/2012 Time: 1110-1155 PT Time Calculation (min): 45 min  Visit#: 4  of 12   Re-eval: 04/12/12 Charges: Therex x 38'  Authorization: medicare  Authorization Visit#: 4  of 10    Subjective: Symptoms/Limitations Symptoms: Pt reports hip and back pain. Pain Assessment Currently in Pain?: Yes Pain Score:   3 Pain Location: Knee (Hip) Pain Orientation: Right   Exercise/Treatments Standing Heel Raises: 15 reps Knee Flexion: 15 reps Lateral Step Up: 10 reps;Step Height: 4";Right;Hand Hold: 2 Forward Step Up: 10 reps;Hand Hold: 1;Step Height: 4";Right Rocker Board: 2 minutes SLS: SLS L:5" R:4" max of 3  Seated Long Arc Quad: 15 reps;Limitations Long Arc Quad Limitations: 5# Supine Quad Sets: 15 reps Short Arc Quad Sets: 15 reps Knee Extension: PROM Knee Flexion: PROM   Modalities Modalities: Cryotherapy Cryotherapy Number Minutes Cryotherapy: 5 Minutes Cryotherapy Location: Knee (Right) Type of Cryotherapy: Ice pack  Physical Therapy Assessment and Plan PT Assessment and Plan Clinical Impression Statement: Pt completes therex with improved control. Pt requires multimodal cuing to equalize wt distribution with standing exercises. Pt continues to display improvements in strength and ROM. Ice applied for only 5' per pt request at end of session.  PT Plan: Continue to progress per PT POC. Begin stairs and gait w/o AD next session.     Problem List Patient Active Problem List  Diagnosis  . ULCERATION OF INTESTINE  . Anxiety  . HTN (hypertension)  . Arthritis  . GERD (gastroesophageal reflux disease)  . OA (osteoarthritis) of knee  . Postop Hyponatremia  . Postop Acute blood loss anemia  . Difficulty in walking  . Stiffness of joint, not elsewhere classified, lower leg  . Weakness of right leg    PT - End of Session Activity  Tolerance: Patient tolerated treatment well General Behavior During Session: [facility] for tasks performed Cognition: [facility] for tasks performed  GP Functional Assessment Tool Used: LEFS  Seth Bake, PTA 03/22/2012, 12:06 PM

## 2012-03-24 ENCOUNTER — Ambulatory Visit (HOSPITAL_COMMUNITY)
Admission: RE | Admit: 2012-03-24 | Discharge: 2012-03-24 | Disposition: A | Discharge status: Home / Self Care | Payer: Medicare Other | Source: Ambulatory Visit | Attending: Orthopedic Surgery | Admitting: Orthopedic Surgery

## 2012-03-24 DIAGNOSIS — R262 Difficulty in walking, not elsewhere classified: Secondary | ICD-10-CM

## 2012-03-24 DIAGNOSIS — M25669 Stiffness of unspecified knee, not elsewhere classified: Secondary | ICD-10-CM

## 2012-03-24 DIAGNOSIS — R29898 Other symptoms and signs involving the musculoskeletal system: Secondary | ICD-10-CM

## 2012-03-24 NOTE — Progress Notes (Signed)
Physical Therapy Treatment Patient Details  Name: Glenda Terry MRN: [id number] Date of Birth: January 13, 1941  Today's Date: 03/24/2012 Time: 1100-1204 PT Time Calculation (min): 64 min  Visit#: 5  of 12   Re-eval: 04/12/12    Authorization: medicare  Charge: there ex 50' ; IP Authorization Visit#: 5  of 10    Subjective: Symptoms/Limitations Symptoms: Hardest thing at home is to put sock and shoes on her right leg Pain Assessment Currently in Pain?: Yes Pain Score:   4 Pain Location: Knee   Exercise/Treatments Mobility/Balance  Ambulation/Gait Ambulation/Gait: Yes Ambulation/Gait Assistance: 6: Modified independent (Device/Increase time) (no Assistive device in the department)    Stationary Bike: 8'   Standing Heel Raises: 15 reps Knee Flexion: 15 reps Terminal Knee Extension: Strengthening;10 reps;Theraband Theraband Level (Terminal Knee Extension): Level 4 (Blue) Lateral Step Up: 10 reps;Step Height: 4";Right;Hand Hold: 2 Forward Step Up: 10 reps;Hand Hold: 1;Step Height: 4";Right Functional Squat: 10 reps Rocker Board: 2 minutes SLS: SLS L:5x   max of 8  Seated Long Arc Quad: 15 reps;Limitations Long Arc Quad Limitations: 5# Supine Quad Sets: 15 reps Short Arc [employer] Sets: 15 reps;Limitations Short Arc Quad Sets Limitations: 3# Knee Extension: PROM Knee Flexion: PROM Sidelying Hip ABduction: 10 reps;Limitations Hip ABduction Limitations: 3# Prone  Hamstring Curl: 15 reps;Limitations;Other (comment) Hamstring Curl Limitations: 3# Hip Extension: 10 reps  Modalities Modalities: Cryotherapy Cryotherapy Number Minutes Cryotherapy: 10 Minutes Cryotherapy Location: Knee  Physical Therapy Assessment and Plan PT Assessment and Plan Clinical Impression Statement: Pt continues to need cuing to keep her weight distributed.  Continues to gain in ROM and strength.  Ambulated without assistive device in department Rehab Potential: Good PT Plan: begin stairs and  prone terminal extension next treatment     Problem List Patient Active Problem List  Diagnosis  . ULCERATION OF INTESTINE  . Anxiety  . HTN (hypertension)  . Arthritis  . GERD (gastroesophageal reflux disease)  . OA (osteoarthritis) of knee  . Postop Hyponatremia  . Postop Acute blood loss anemia  . Difficulty in walking  . Stiffness of joint, not elsewhere classified, lower leg  . Weakness of right leg    PT - End of Session Activity Tolerance: Patient tolerated treatment well General Behavior During Session: [facility] for tasks performed Cognition: [facility] for tasks performed  GP Functional Assessment Tool Used: LEFS  RUSSELL,CINDY 03/24/2012, 12:01 PM

## 2012-03-27 ENCOUNTER — Ambulatory Visit (HOSPITAL_COMMUNITY)
Admission: RE | Admit: 2012-03-27 | Discharge: 2012-03-27 | Disposition: A | Discharge status: Home / Self Care | Payer: Medicare Other | Source: Ambulatory Visit | Attending: Orthopedic Surgery | Admitting: Orthopedic Surgery

## 2012-03-27 DIAGNOSIS — R29898 Other symptoms and signs involving the musculoskeletal system: Secondary | ICD-10-CM

## 2012-03-27 DIAGNOSIS — R262 Difficulty in walking, not elsewhere classified: Secondary | ICD-10-CM

## 2012-03-27 DIAGNOSIS — M25669 Stiffness of unspecified knee, not elsewhere classified: Secondary | ICD-10-CM

## 2012-03-27 NOTE — Progress Notes (Addendum)
Physical Therapy Treatment Patient Details  Name: Glenda Terry MRN: [id number] Date of Birth: 1940-07-08  Today's Date: 03/27/2012 Time: 1108-1218 PT Time Calculation (min): 70 min Charge There ex x 52; IP x 10 Visit#: 6  of 12   Re-eval: 04/12/12   Authorization: medicare  Authorization Time Period:    Authorization Visit#: 6  of 10   Subjective: Symptoms/Limitations Symptoms: Pt states she is very stiff today; wore 2" heels for about three hours yesterday.  Exercise/Treatments Stationary Bike: 8'   Standing Heel Raises: 15 reps Knee Flexion: 15 reps;Limitations Knee Flexion Limitations: 3# Terminal Knee Extension: Strengthening;15 reps;Theraband Theraband Level (Terminal Knee Extension): Level 4 (Blue) Lateral Step Up: 10 reps;Step Height: 4";Right;Hand Hold: 2 Forward Step Up: 10 reps;Hand Hold: 1;Step Height: 4";Right Functional Squat: 15 reps Rocker Board: 2 minutes SLS: SLS L:5x  10 max; R 5"  Other Standing Knee Exercises: steps in dept x 2 RT Seated Long Arc Quad: 15 reps;Limitations Long Arc Quad Limitations: 5# Supine Quad Sets: 15 reps Heel Slides: 10 reps Knee Extension: PROM Knee Flexion: PROM Sidelying Hip ABduction: 10 reps;Limitations Hip ABduction Limitations: 3# Prone  Hamstring Curl: 15 reps;Limitations;Other (comment) Hamstring Curl Limitations: 5# Hip Extension: 10 reps   Physical Therapy Assessment and Plan PT Assessment and Plan Clinical Impression Statement: Pt continues to be lacking extension; able to ascend and descend steps in dept without hand hold.   PT Plan: begin prone terminal extension; this was not completed this session due to breathing issues.    Goals Home Exercise Program PT Goal: Perform Home Exercise Program - Progress: Met PT Short Term Goals PT Short Term Goal 1 - Progress: Met PT Short Term Goal 2 - Progress: Met PT Short Term Goal 3 - Progress: Met PT Long Term Goals PT Long Term Goal 1 - Progress: Met PT  Long Term Goal 2 - Progress: Progressing toward goal Long Term Goal 4 Progress: Progressing toward goal Long Term Goal 5 Progress: Progressing toward goal  Problem List Patient Active Problem List  Diagnosis  . ULCERATION OF INTESTINE  . Anxiety  . HTN (hypertension)  . Arthritis  . GERD (gastroesophageal reflux disease)  . OA (osteoarthritis) of knee  . Postop Hyponatremia  . Postop Acute blood loss anemia  . Difficulty in walking  . Stiffness of joint, not elsewhere classified, lower leg  . Weakness of right leg    PT - End of Session Activity Tolerance: Patient tolerated treatment well  GP    Maccoy Haubner,CINDY 03/27/2012, 12:12 PM

## 2012-03-29 ENCOUNTER — Ambulatory Visit (HOSPITAL_COMMUNITY)
Admission: RE | Admit: 2012-03-29 | Discharge: 2012-03-29 | Disposition: A | Discharge status: Home / Self Care | Payer: Medicare Other | Source: Ambulatory Visit | Attending: Orthopedic Surgery | Admitting: Orthopedic Surgery

## 2012-03-29 NOTE — Progress Notes (Signed)
Physical Therapy Treatment Patient Details  Name: Glenda Terry MRN: [id number] Date of Birth: 1940-10-16  Today's Date: 03/29/2012 Time: 1102-1155 PT Time Calculation (min): 53 min  Visit#: 7  of 12   Re-eval: 04/12/12 Charges: Therex x 40' Ice x 10'  Authorization: medicare  Authorization Visit#: 7  of 10    Subjective: Symptoms/Limitations Symptoms: Pt states that she will have back surgery February 5th. Pain Assessment Currently in Pain?: Yes Pain Score:   3 Pain Location: Knee Pain Orientation: Right   Exercise/Treatments Aerobic Stationary Bike: 8' full revolution seat 8 For ROM Standing Lateral Step Up: 15 reps;Step Height: 4";Hand Hold: 2;Right Forward Step Up: 15 reps;Step Height: 4";Right;Hand Hold: 1 Step Down: 15 reps;Step Height: 4";Hand Hold: 1;Right Functional Squat: 15 reps Stairs: 1RT recip with 1HR Rocker Board: 2 minutes SLS: SLS R:5" max of 5 Supine Knee Extension: PROM Knee Flexion: PROM   Modalities Modalities: Cryotherapy Cryotherapy Number Minutes Cryotherapy: 10 Minutes Cryotherapy Location: Knee (Right) Type of Cryotherapy: Ice pack  Physical Therapy Assessment and Plan PT Assessment and Plan Clinical Impression Statement: Pt completes stairs training with reciprocal pattern both ascending and descending. Pt continues to have limited ROM. Pt requires vc's to equalize wt distribution with functional squats. Ice applied at end of session to limit pain and swelling. PT Plan: Continue to progress strength/ROM per PT POC.     Problem List Patient Active Problem List  Diagnosis  . ULCERATION OF INTESTINE  . Anxiety  . HTN (hypertension)  . Arthritis  . GERD (gastroesophageal reflux disease)  . OA (osteoarthritis) of knee  . Postop Hyponatremia  . Postop Acute blood loss anemia  . Difficulty in walking  . Stiffness of joint, not elsewhere classified, lower leg  . Weakness of right leg    PT - End of Session Activity  Tolerance: Patient tolerated treatment well General Behavior During Session: [facility] for tasks performed Cognition: [facility] for tasks performed   Seth Bake, PTA 03/29/2012, 12:16 PM

## 2012-03-31 ENCOUNTER — Ambulatory Visit (HOSPITAL_COMMUNITY)
Admission: RE | Admit: 2012-03-31 | Discharge: 2012-03-31 | Disposition: A | Discharge status: Home / Self Care | Payer: Medicare Other | Source: Ambulatory Visit | Attending: Orthopedic Surgery | Admitting: Orthopedic Surgery

## 2012-03-31 NOTE — Progress Notes (Addendum)
Physical Therapy Treatment Patient Details  Name: Glenda Terry MRN: [id number] Date of Birth: 04/07/1940  Today's Date: 03/31/2012 Time: 1308-6578 PT Time Calculation (min): 71 min Charge:  Therex 48; HMP x 15. Visit#: 8  of 12   Re-eval: 04/12/12    Authorization: medicare  Authorization Visit#: 8  of 10    Subjective:  Pt states that her sister told her that she was walking the best she has in years.  Pt states she is not having any pain in her knee but her back pain is very severe today.   Exercise/Treatments  Aerobic Stationary Bike: 8' full revolution seat 8   Standing Lateral Step Up: 15 reps;Step Height: 4";Hand Hold: 2;Right Forward Step Up: 15 reps;Step Height: 4";Right;Hand Hold: 1 Step Down: 15 reps;Step Height: 4";Hand Hold: 1;Right Seated Long Arc Quad: 15 reps Long Arc Quad Limitations: 5# Supine Quad Sets: 15 reps Short Arc Quad Sets: 15 reps Heel Slides: 15 reps Terminal Knee Extension: 15 reps Knee Extension: PROM Knee Flexion: PROM PT foot on therapist shoulder contract relax to increase knee flex in knee to chest motion.  Modalities Modalities: Moist Heat Manual Therapy Manual Therapy: Edema management Edema Management: retromassge to decrease swelling  x 12 min Moist Heat Therapy Number Minutes Moist Heat: 15 Minutes Moist Heat Location:  (back)  Physical Therapy Assessment and Plan PT Assessment and Plan Clinical Impression Statement: Pt had significant back pain today after being in the car for a long ride yesterday,(pt scheduled for back surgery Feb 15th.)  Treatment altered so not to aggrevate back. Rehab Potential: Good PT Plan: resume weight bearing exercises if back permits.      Problem List Patient Active Problem List  Diagnosis  . ULCERATION OF INTESTINE  . Anxiety  . HTN (hypertension)  . Arthritis  . GERD (gastroesophageal reflux disease)  . OA (osteoarthritis) of knee  . Postop Hyponatremia  . Postop Acute blood loss  anemia  . Difficulty in walking  . Stiffness of joint, not elsewhere classified, lower leg  . Weakness of right leg    PT - End of Session Activity Tolerance: Patient tolerated treatment well General Behavior During Session: [facility] for tasks performed  GP    Nathalya Wolanski,CINDY 03/31/2012, 4:38 PM

## 2012-04-03 ENCOUNTER — Ambulatory Visit (HOSPITAL_COMMUNITY)
Admission: RE | Admit: 2012-04-03 | Discharge: 2012-04-03 | Disposition: A | Discharge status: Home / Self Care | Payer: Medicare Other | Source: Ambulatory Visit | Attending: Orthopedic Surgery | Admitting: Orthopedic Surgery

## 2012-04-03 DIAGNOSIS — R29898 Other symptoms and signs involving the musculoskeletal system: Secondary | ICD-10-CM

## 2012-04-03 DIAGNOSIS — M25669 Stiffness of unspecified knee, not elsewhere classified: Secondary | ICD-10-CM

## 2012-04-03 DIAGNOSIS — R262 Difficulty in walking, not elsewhere classified: Secondary | ICD-10-CM

## 2012-04-03 NOTE — Progress Notes (Signed)
Physical Therapy Treatment Patient Details  Name: Glenda Terry MRN: [id number] Date of Birth: [birth year]  Today's Date: 04/03/2012 Time: 4098-1191 PT Time Calculation (min): 63 min  Visit#: 9  of 12   Re-eval: 04/12/12 (MD apt 04/11/12) Assessment Diagnosis: R TKR Surgical Date: 01/24/12 Next MD Visit: Dr Lequita Halt 04/11/2012 Prior Therapy: SNF; [facility] Charge: Therex 48', Manual 12'  Authorization: Medicare  Authorization Time Period:    Authorization Visit#: 9  of 10    Subjective: Symptoms/Limitations Symptoms: Pt reported R LE femoral nerve pain symptoms at beginning of session, Pain scale 4/10 Pain Assessment Currently in Pain?: Yes Pain Score:   4 Pain Location: Knee Pain Orientation: Right  Objective:   Exercise/Treatments Aerobic Stationary Bike: 8' full revolution seat 8 Standing Terminal Knee Extension: Strengthening;15 reps;Theraband;Limitations Theraband Level (Terminal Knee Extension): Level 4 (Blue) Terminal Knee Extension Limitations: 10 sec holds Seated Long Arc Quad: 15 reps Long Arc Quad Limitations: 5# Other Seated Knee Exercises: heel roll out 5x 10" Supine Quad Sets: 15 reps;Limitations Quad Sets Limitations: 10 sec holds Short [employer] Sets: 15 reps Heel Slides: 15 reps Terminal Knee Extension: 15 reps Knee Extension: PROM;3 sets Knee Flexion: PROM;3 sets Prone  Hamstring Curl: 15 reps;Limitations;Other (comment) Hamstring Curl Limitations: 5# Hip Extension: 10 reps   Manual Therapy Manual Therapy: Other (comment) Other Manual Therapy: Femoral nerve glides in prone position to reduce radicular symptoms  Physical Therapy Assessment and Plan PT Assessment and Plan Clinical Impression Statement: Began session with femoral nerve glides in prone position to eliminate radicular pain front of R LE.  Continued with therex for LE strengthening and ROM, pt improving distal quad contraction with tactile cueing.  Added heel roll out for hip  musculature strengthening to reduce ER with gait.   PT Plan: Continue wtih curent POC, gcode due next session.  Resume weight bearing exercises as back permits.    Goals    Problem List Patient Active Problem List  Diagnosis  . ULCERATION OF INTESTINE  . Anxiety  . HTN (hypertension)  . Arthritis  . GERD (gastroesophageal reflux disease)  . OA (osteoarthritis) of knee  . Postop Hyponatremia  . Postop Acute blood loss anemia  . Difficulty in walking  . Stiffness of joint, not elsewhere classified, lower leg  . Weakness of right leg    PT - End of Session Activity Tolerance: Patient tolerated treatment well General Behavior During Session: [facility] for tasks performed Cognition: [facility] for tasks performed  GP    [name] 04/03/2012, 12:18 PM

## 2012-04-05 ENCOUNTER — Ambulatory Visit (HOSPITAL_COMMUNITY)
Admission: RE | Admit: 2012-04-05 | Discharge: 2012-04-05 | Disposition: A | Discharge status: Home / Self Care | Payer: Medicare Other | Source: Ambulatory Visit | Attending: Orthopedic Surgery | Admitting: Orthopedic Surgery

## 2012-04-05 NOTE — Progress Notes (Signed)
Physical Therapy Treatment Patient Details  Name: Glenda Terry MRN: [id number] Date of Birth: 08-Jun-1940  Today's Date: 04/05/2012 Time: 1100-1153 PT Time Calculation (min): 53 min  Visit#: 10  of 12   Re-eval: 04/12/12 (MD apt 04/11/12) Authorization: Medicare  Authorization Visit#: 10  of 10   Charges:  therex 38, manual 12'  Subjective: Symptoms/Limitations Symptoms: Pt. states her knee is hurting 3/10 today.  Reports she is having back surgery in 2 weeks (04/20/12) secondary to 2 ruptured disk. Pain Assessment Currently in Pain?: Yes Pain Score:   3 Pain Location: Knee Pain Orientation: Right  Objective: A/PROM measured today for R Knee: AROM:  10-95 degrees (was 14-92 degrees); PROM:  6-105 degrees (was 12-95 degrees).   LEFS:  31/80 (was 26/80); equates to 61% perceived disability (was 68%)  Exercise/Treatments Aerobic Stationary Bike: 8' full revolution seat 8 Standing Terminal Knee Extension: Strengthening;15 reps;Theraband;Limitations Theraband Level (Terminal Knee Extension): Level 4 (Blue) Terminal Knee Extension Limitations: 10 sec holds Lateral Step Up: 15 reps;Step Height: 4";Hand Hold: 2;Right Forward Step Up: 15 reps;Step Height: 4";Right;Hand Hold: 1 Step Down: 15 reps;Step Height: 4";Hand Hold: 1;Right SLS: SLS R:7" max of 5 Seated Long Arc Quad: 15 reps Long Arc Quad Limitations: 5# Other Seated Knee Exercises: heel roll out 5x 10" Supine Quad Sets: 15 reps;Limitations Heel Slides: 15 reps Knee Extension: PROM;3 sets Knee Flexion: PROM;3 sets   Manual Therapy Manual Therapy: Other (comment) Other Manual Therapy: MFR inferior and lateral knee to decrease adhesions and scar massage  Physical Therapy Assessment and Plan PT Assessment and Plan Clinical Impression Statement: Pt. progressing with overall strength, ROM and pain.  LEFS has only increased 5 points since IE, however remaining perceived disability is mainly due to back dysfunction.   A/PROM measured today R knee:  AROM:  10-95 degrees (was 14-92 degrees); PROM:  6-105 degrees (was 12-95 degrees).  Pt. with adhesions/restrictions inferior and lateral knee.  MFR/scar massage initiated to help improve ROM.  Pt. to have LB surgery to repair 2 herniated disk in two more weeks.   PT Plan: Pt with 2 visits remaining; focus on manual techniques (MFR/scar massage) to decrease adhesions and increase ROM.     Problem List Patient Active Problem List  Diagnosis  . ULCERATION OF INTESTINE  . Anxiety  . HTN (hypertension)  . Arthritis  . GERD (gastroesophageal reflux disease)  . OA (osteoarthritis) of knee  . Postop Hyponatremia  . Postop Acute blood loss anemia  . Difficulty in walking  . Stiffness of joint, not elsewhere classified, lower leg  . Weakness of right leg    PT - End of Session Activity Tolerance: Patient tolerated treatment well General Behavior During Session: [facility] for tasks performed Cognition: [facility] for tasks performed  GP Functional Assessment Tool Used: LEFS Functional Limitation: Mobility: Walking and moving around Mobility: Walking and Moving Around Current Status ([id number]): At least 60 percent but less than 80 percent impaired, limited or restricted Mobility: Walking and Moving Around Goal Status [phone]): At least 20 percent but less than 40 percent impaired, limited or restricted  Lurena Nida, PTA/CLT 04/05/2012, 12:02 PM

## 2012-04-06 NOTE — H&P (Signed)
Glenda Terry is an 72 y.o. female.   Chief Complaint: back pain  HPI: The patient presented to Dr. Darrelyn Hillock with severe pain in her back. It now radiates into the right lower extremity. The pain started about two weeks ago with no known injury. Pain radiates all the way down to the bottom of the right foot.She recently had a right total knee arthroplasty done by Dr. Lequita Halt. MRI revealed a definite herniated disc at L4-5 on the right. 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   Past Medical History  Diagnosis Date  . COPD (chronic obstructive pulmonary disease)   . Back pain, chronic   . Anxiety   . Hypertension   . Arthritis   . Shortness of breath   . GERD (gastroesophageal reflux disease)     Past Surgical History  Procedure Date  . Hysterotomy   . Tubal ligation   . Abdominal hysterectomy   . Knee arthroscopy   . Total knee arthroplasty 01/24/2012    Procedure: TOTAL KNEE ARTHROPLASTY;  Surgeon: Loanne Drilling, MD;  Location: [facility];  Service: Orthopedics;  Laterality: Right;    Family History  Problem Relation Age of Onset  . Breast cancer Sister    Social History:  reports that she quit smoking about 20 years ago. She does not have any smokeless tobacco history on file. She reports that she does not drink alcohol or use illicit drugs.  Allergies:  Allergies  Allergen Reactions  . Flexeril (Cyclobenzaprine) Anaphylaxis  . Other Anaphylaxis    ALL TREE NUTS  . Statins Other (See Comments)    is pain and swelling      Current outpatient prescriptions: acetaminophen (TYLENOL) 325 MG tablet, Take 2 tablets (650 mg total) by mouth every 6 (six) hours as needed (or Fever >/= 101)., Disp: 60 tablet, Rfl: 0;   albuterol (PROAIR HFA) 108 (90 BASE) MCG/ACT inhaler, Inhale 2 puffs into the lungs every 6 (six) hours as needed. For COPD, Disp: , Rfl: ;   ALPRAZolam (XANAX) 0.5 MG tablet, Take 0.5 mg by mouth every 8 (eight) hours as needed. For  anxiety, Disp: , Rfl:  beclomethasone (QVAR) 80 MCG/ACT inhaler, Inhale 2 puffs into the lungs 2 (two) times daily. , Disp: , Rfl: ;   bisacodyl (DULCOLAX) 10 MG suppository, Place 1 suppository (10 mg total) rectally daily as needed., Disp: 12 suppository, Rfl: 0;   cefUROXime (CEFTIN) 500 MG tablet, Take 500 mg by mouth 2 (two) times daily., Disp: , Rfl: ;  diphenhydrAMINE (BENADRYL) 25 MG tablet, Take 25 mg by mouth at bedtime as needed. For allergies, Disp: , Rfl:  docusate sodium 100 MG CAPS, Take 100 mg by mouth 2 (two) times daily., Disp: 60 capsule, Rfl: 0;  EPINEPHrine (EPIPEN 2-PAK) 0.3 mg/0.3 mL DEVI, Inject 0.3 mg into the muscle once. As needed for tree nut allergy, Disp: , Rfl: ;   esomeprazole (NEXIUM) 40 MG capsule, Take 40 mg by mouth daily before breakfast. , Disp: , Rfl: ;  fluticasone (FLONASE) 50 MCG/ACT nasal spray, Place 2 sprays into the nose daily., Disp: , Rfl:  iron polysaccharides (NIFEREX) 150 MG capsule, Take 1 capsule (150 mg total) by mouth daily., Disp: 21 capsule, Rfl: 0;  loratadine (CLARITIN) 10 MG tablet, Take 10 mg by mouth daily., Disp: , Rfl: ;  methocarbamol (ROBAXIN) 500 MG tablet, Take 1 tablet (500 mg total) by mouth every 6 (six) hours as needed., Disp: 80 tablet, Rfl: 0;  montelukast (SINGULAIR) 10 MG tablet, Take 10 mg by mouth at bedtime., Disp: , Rfl:  ondansetron (ZOFRAN) 4 MG tablet, Take 1 tablet (4 mg total) by mouth every 6 (six) hours as needed for nausea., Disp: 40 tablet, Rfl: 0;   oxyCODONE (OXY IR/ROXICODONE) 5 MG immediate release tablet, Take 1-2 tablets (5-10 mg total) by mouth every 3 (three) hours as needed., Disp: 80 tablet, Rfl: 0;   polyethylene glycol (MIRALAX / GLYCOLAX) packet, Take 17 g by mouth daily as needed., Disp: 14 each, Rfl: 0 predniSONE (DELTASONE) 20 MG tablet, Take 20 mg by mouth daily., Disp: , Rfl: ;   rivaroxaban (XARELTO) 10 MG TABS tablet, Take 1 tablet (10 mg total) by mouth daily with breakfast. Take Xarelto for two  and a half more weeks, then discontinue Xarelto., Disp: 18 tablet, Rfl: 0;  solifenacin (VESICARE) 5 MG tablet, Take 10 mg by mouth daily., Disp: , Rfl:  telmisartan-hydrochlorothiazide (MICARDIS HCT) 80-12.5 MG per tablet, Take 1 tablet by mouth every morning. , Disp: , Rfl: ;   traMADol (ULTRAM) 50 MG tablet, Take 1-2 tablets (50-100 mg total) by mouth every 6 (six) hours as needed for pain (mild pain)., Disp: 60 tablet, Rfl: 0   Review of Systems  Constitutional: Negative.   HENT: Negative.  Negative for neck pain.   Eyes: Negative.   Respiratory: Positive for shortness of breath. Negative for cough, hemoptysis, sputum production and wheezing.   Cardiovascular: Negative.   Gastrointestinal: Negative.   Genitourinary: Negative.   Musculoskeletal: Positive for back pain. Negative for myalgias, joint pain and falls.  Skin: Negative.   Neurological: Negative.   Endo/Heme/Allergies: Negative.   Psychiatric/Behavioral: Negative.      Physical Exam  Constitutional: She is oriented to person, place, and time. She appears well-developed and well-nourished. No distress.  HENT:  Head: Normocephalic and atraumatic.  Right Ear: External ear normal.  Left Ear: External ear normal.  Nose: Nose normal.  Mouth/Throat: Oropharynx is clear and moist.  Eyes: Conjunctivae normal and EOM are normal.  Neck: Normal range of motion. Neck supple. No tracheal deviation present. No thyromegaly present.  Cardiovascular: Regular rhythm, normal heart sounds and intact distal pulses.  Tachycardia present.   Respiratory: Effort normal and breath sounds normal. No respiratory distress. She has no wheezes. She exhibits no tenderness.  GI: Soft. Bowel sounds are normal. She exhibits no distension and no mass. There is no tenderness.  Musculoskeletal:       Right hip: Normal.       Left hip: Normal.       Right knee: Normal.       Left knee: Normal.       Lumbar back: She exhibits decreased range of motion and  pain.       Back:       Right lower leg: She exhibits no tenderness and no swelling.       Left lower leg: She exhibits no tenderness and no swelling.  Lymphadenopathy:    She has no cervical adenopathy.  Neurological: She is alert and oriented to person, place, and time. She has normal strength and normal reflexes. No sensory deficit.  Skin: No rash noted. She is not diaphoretic. No erythema.  Psychiatric: She has a normal mood and affect. Her behavior is normal.     Assessment/Plan Lumbar spine, spinal stenosis Lumbar disc herniation She needs a decompressive lumbar laminectomy and microdiscectomy L4-L5, L5-S1 on the right. She will be admitted for this procedure. Dr. Darrelyn Hillock  discussed the risks and benefits of this procedure.    [place] Spainhower LAUREN 04/06/2012, 2:06 PM

## 2012-04-07 ENCOUNTER — Ambulatory Visit (HOSPITAL_COMMUNITY)
Admission: RE | Admit: 2012-04-07 | Discharge: 2012-04-07 | Disposition: A | Discharge status: Home / Self Care | Payer: Medicare Other | Source: Ambulatory Visit | Attending: Orthopedic Surgery | Admitting: Orthopedic Surgery

## 2012-04-07 NOTE — Progress Notes (Signed)
Physical Therapy Treatment Patient Details  Name: Glenda Terry MRN: [id number] Date of Birth: 06-28-1940  Today's Date: 04/07/2012 Time: 1057-1150 PT Time Calculation (min): 53 min  Visit#: 11  of 12   Re-eval: 04/12/12 (MD apt 04/11/12) Charges: Therex x 25' Manual x 15' Ice x 10'  Authorization: Medicare  Authorization Visit#: 11  of 20    Subjective: Symptoms/Limitations Symptoms: Pt reports no pain only stiffness. Pain Assessment Currently in Pain?: No/denies   Exercise/Treatments Stretches Quad Stretch: 3 reps;30 seconds;Limitations Lobbyist Limitations: Chair stretch Aerobic Stationary Bike: 8' full revolution seat 8 Standing Lateral Step Up: 15 reps;Hand Hold: 2;Right;Step Height: 6" Forward Step Up: 15 reps;Step Height: 4";Right;Hand Hold: 1 Step Down: 15 reps;Step Height: 4";Hand Hold: 1;Right   Manual Therapy Manual Therapy: Myofascial release Myofascial Release: MFR to R anterior knee decrease tightness and adhesions Cryotherapy Number Minutes Cryotherapy: 10 Minutes Cryotherapy Location: Knee (Right) Type of Cryotherapy: Ice pack  Physical Therapy Assessment and Plan PT Assessment and Plan Clinical Impression Statement: Pt continues to display improvements in strength and stability. MFR, jt mobs and PROM completed to R knee to improve ROM. Ice applied at end of session to limit pain and inflammation.  PT Plan: Pt with 1 visit remaining; focus on manual techniques (MFR/scar massage) to decrease adhesions and increase ROM.     Problem List Patient Active Problem List  Diagnosis  . ULCERATION OF INTESTINE  . Anxiety  . HTN (hypertension)  . Arthritis  . GERD (gastroesophageal reflux disease)  . OA (osteoarthritis) of knee  . Postop Hyponatremia  . Postop Acute blood loss anemia  . Difficulty in walking  . Stiffness of joint, not elsewhere classified, lower leg  . Weakness of right leg    PT - End of Session Activity Tolerance: Patient  tolerated treatment well General Behavior During Session: [facility] for tasks performed Cognition: [facility] for tasks performed  GP Functional Assessment Tool Used: LEFS  Seth Bake, PTA 04/07/2012, 12:03 PM

## 2012-04-10 ENCOUNTER — Ambulatory Visit (HOSPITAL_COMMUNITY)
Admission: RE | Admit: 2012-04-10 | Discharge: 2012-04-10 | Disposition: A | Discharge status: Home / Self Care | Payer: Medicare Other | Source: Ambulatory Visit | Attending: Orthopedic Surgery | Admitting: Orthopedic Surgery

## 2012-04-10 ENCOUNTER — Encounter (HOSPITAL_COMMUNITY): Payer: Self-pay | Admitting: Pharmacy Technician

## 2012-04-10 DIAGNOSIS — R29898 Other symptoms and signs involving the musculoskeletal system: Secondary | ICD-10-CM

## 2012-04-10 DIAGNOSIS — R262 Difficulty in walking, not elsewhere classified: Secondary | ICD-10-CM

## 2012-04-10 DIAGNOSIS — M25669 Stiffness of unspecified knee, not elsewhere classified: Secondary | ICD-10-CM

## 2012-04-10 NOTE — Evaluation (Signed)
Physical Therapy Assessment Patient Details  Name: CRISTOL ENGDAHL MRN: [id number] Date of Birth: 22-Jan-1941  Today's Date: 04/10/2012 Time: 1115-1201 PT Time Calculation (min): 46 min  Visit#: 12  of 12   Assessment Diagnosis: R TKR Surgical Date: 01/24/12 Next MD Visit: 04/11/2012  Authorization: medicare  Charge: MM test; ROM test; there ex x 18 Authorization Visit#: 12  of 12    Past Medical History:  Past Medical History  Diagnosis Date  . COPD (chronic obstructive pulmonary disease)   . Back pain, chronic   . Anxiety   . Hypertension   . Arthritis   . Shortness of breath   . GERD (gastroesophageal reflux disease)    Past Surgical History:  Past Surgical History  Procedure Date  . Hysterotomy   . Tubal ligation   . Abdominal hysterectomy   . Knee arthroscopy   . Total knee arthroplasty 01/24/2012    Procedure: TOTAL KNEE ARTHROPLASTY;  Surgeon: Loanne Drilling, MD;  Location: [facility];  Service: Orthopedics;  Laterality: Right;    Subjective Symptoms/Limitations Symptoms: Pt states her back is bothering her but her knee is fine.   Back surgery is scheduled for 04/20/2012. How long can you sit comfortably?: Pt states she would be able to sit as long as she wanted with her knee now but she is limited sitting due to her back How long can you stand comfortably?: Pt is limited in standing but again this is due to her back not her knee. How long can you walk comfortably?: Pt is no longer walking with a cane.  She is able to walk for 30 minutes now but this is again due to her back Pain Assessment Pain Score: 0-No pain Pain Location: Knee Pain Orientation: Right   Sensation/Coordination/Flexibility/Functional Tests Functional Tests Functional Tests: LEFS 36/80=45% disabled  Assessment RLE AROM (degrees) Right Knee Extension: 7  Right Knee Flexion: 110  RLE PROM (degrees) Right Knee Extension: 7  Right Knee Flexion: 113  RLE Strength Right Hip Flexion: 5/5  (was 5/5) Right Hip Extension: 3+/5 (was 3-; L is 3+/5 as well) Right Hip ABduction: 5/5 (was 3+/t) Right Knee Flexion: 5/5 (was 3+) Right Knee Extension:  (4+/5 was 4-/5.) Right Ankle Dorsiflexion: 3+/5  Exercise/Treatments  Aerobic Stationary Bike: 8'   Supine Quad Sets: 10 reps Terminal Knee Extension: 10 reps Knee Extension: PROM Knee Flexion: PROM Prone  Hip Extension: 10 reps   Physical Therapy Assessment and Plan PT Assessment and Plan Clinical Impression Statement: Pt continues to gain in ROM and strength but does not longer need skilled care.  PT given new HEP to concentrate on current deficits. PT Plan: D/C    Goals Home Exercise Program PT Goal: Perform Home Exercise Program - Progress: Met PT Short Term Goals PT Short Term Goal 1 - Progress: Met PT Short Term Goal 2 - Progress: Met PT Short Term Goal 3 - Progress: Met PT Long Term Goals PT Long Term Goal 1 - Progress: Met PT Long Term Goal 2 - Progress: Partly met Long Term Goal 3 Progress: Partly met Long Term Goal 4 Progress: Met Long Term Goal 5 Progress: Partly met (Pt is no longer waking due to her knee it is now due to her )  Problem List Patient Active Problem List  Diagnosis  . ULCERATION OF INTESTINE  . Anxiety  . HTN (hypertension)  . Arthritis  . GERD (gastroesophageal reflux disease)  . OA (osteoarthritis) of knee  . Postop Hyponatremia  .  Postop Acute blood loss anemia  . Difficulty in walking  . Stiffness of joint, not elsewhere classified, lower leg  . Weakness of right leg    PT - End of Session Activity Tolerance: Patient tolerated treatment well General Behavior During Session: [facility] for tasks performed PT Plan of Care PT Home Exercise Plan: updated.  GP Functional Assessment Tool Used: LEFS Functional Limitation: Mobility: Walking and moving around Mobility: Walking and Moving Around Goal Status [phone]): At least 20 percent but less than 40 percent impaired, limited or  restricted (pt going for back surgery 04/20/2012.) Mobility: Walking and Moving Around Discharge Status [phone]): At least 40 percent but less than 60 percent impaired, limited or restricted  Lekesha Claw,CINDY 04/10/2012, 12:14 PM  Physician Documentation Your signature is required to indicate approval of the treatment plan as stated above.  Please sign and either send electronically or make a copy of this report for your files and return this physician signed original.   Please mark one 1.__approve of plan  2. ___approve of plan with the following conditions.   ______________________________                                                          _____________________ Physician Signature                                                                                                             Date

## 2012-04-11 ENCOUNTER — Encounter (HOSPITAL_COMMUNITY): Payer: Self-pay

## 2012-04-11 ENCOUNTER — Other Ambulatory Visit (HOSPITAL_COMMUNITY): Payer: Self-pay | Admitting: Orthopedic Surgery

## 2012-04-11 ENCOUNTER — Encounter (HOSPITAL_COMMUNITY)
Admission: RE | Admit: 2012-04-11 | Discharge: 2012-04-11 | Disposition: A | Discharge status: Home / Self Care | Payer: Medicare Other | Source: Ambulatory Visit | Attending: Orthopedic Surgery | Admitting: Orthopedic Surgery

## 2012-04-11 ENCOUNTER — Ambulatory Visit (HOSPITAL_COMMUNITY)
Admission: RE | Admit: 2012-04-11 | Discharge: 2012-04-11 | Disposition: A | Discharge status: Home / Self Care | Payer: Medicare Other | Source: Ambulatory Visit | Attending: Surgical | Admitting: Surgical

## 2012-04-11 DIAGNOSIS — M4120 Other idiopathic scoliosis, site unspecified: Secondary | ICD-10-CM | POA: Insufficient documentation

## 2012-04-11 DIAGNOSIS — Z01812 Encounter for preprocedural laboratory examination: Secondary | ICD-10-CM | POA: Insufficient documentation

## 2012-04-11 DIAGNOSIS — Z01818 Encounter for other preprocedural examination: Secondary | ICD-10-CM | POA: Insufficient documentation

## 2012-04-11 LAB — COMPREHENSIVE METABOLIC PANEL
ALT: 19 U/L (ref 0–35)
AST: 23 U/L (ref 0–37)
Albumin: 3.2 g/dL — ABNORMAL LOW (ref 3.5–5.2)
Alkaline Phosphatase: 94 U/L (ref 39–117)
BUN: 15 mg/dL (ref 6–23)
CO2: 26 mEq/L (ref 19–32)
Calcium: 9.1 mg/dL (ref 8.4–10.5)
Chloride: 102 mEq/L (ref 96–112)
Creatinine, Ser: 0.93 mg/dL (ref 0.50–1.10)
GFR calc Af Amer: 71 mL/min — ABNORMAL LOW (ref >90)
GFR calc non Af Amer: 61 mL/min — ABNORMAL LOW (ref >90)
Glucose, Bld: 126 mg/dL — ABNORMAL HIGH (ref 70–99)
Potassium: 4.2 mEq/L (ref 3.5–5.1)
Sodium: 135 mEq/L (ref 135–145)
Total Bilirubin: 0.2 mg/dL — ABNORMAL LOW (ref 0.3–1.2)
Total Protein: 6.9 g/dL (ref 6.0–8.3)

## 2012-04-11 LAB — URINALYSIS, ROUTINE W REFLEX MICROSCOPIC
Bilirubin Urine: NEGATIVE
Glucose, UA: NEGATIVE mg/dL
Hgb urine dipstick: NEGATIVE
Ketones, ur: NEGATIVE mg/dL
Leukocytes, UA: NEGATIVE
Nitrite: NEGATIVE
Protein, ur: NEGATIVE mg/dL
Specific Gravity, Urine: 1.031 — ABNORMAL HIGH (ref 1.005–1.030)
Urobilinogen, UA: 1 mg/dL (ref 0.0–1.0)
pH: 5.5 (ref 5.0–8.0)

## 2012-04-11 LAB — APTT: aPTT: 28 seconds (ref 24–37)

## 2012-04-11 LAB — CBC
HCT: 33.1 % — ABNORMAL LOW (ref 36.0–46.0)
Hemoglobin: 9.9 g/dL — ABNORMAL LOW (ref 12.0–15.0)
RBC: 4.42 MIL/uL (ref 3.87–5.11)
RDW: 15.3 % (ref 11.5–15.5)
WBC: 7.2 10*3/uL (ref 4.0–10.5)

## 2012-04-11 LAB — PROTIME-INR
INR: 0.94 (ref 0.00–1.49)
Prothrombin Time: 12.5 seconds (ref 11.6–15.2)

## 2012-04-11 LAB — SURGICAL PCR SCREEN: Staphylococcus aureus: NEGATIVE

## 2012-04-11 NOTE — Progress Notes (Signed)
Cbc results faxed to dr Darrelyn Hillock by epic

## 2012-04-11 NOTE — Patient Instructions (Addendum)
20 Glenda Terry  04/11/2012   Your procedure is scheduled on: 04-20-2012  Report to [facility] at 1230 PM  Call this number if you have problems the morning of surgery [phone]   Remember:   Do not eat food  :After Midnight.  Clear liquids midnight until 0900 am day of surgery, then nothing by mouth.  Take these medicines the morning of surgery with A SIP OF WATER: PROAIR INHALER IF NEEDED AND BRING INHALER, XANAX IF NEEDED, QVAR, NEXIUM, FLONASE NASAL SPRAY, OXYCODONE IF NEEDED                                SEE [facility] PREPARING FOR SURGERY SHEET   Do not wear jewelry, make-up or nail polish.  Do not wear lotions, powders, or perfumes. You may wear deodorant.   Men may shave face and neck.  Do not bring valuables to the hospital.  Contacts, dentures or bridgework may not be worn into surgery.  Leave suitcase in the car. After surgery it may be brought to your room.  For patients admitted to the hospital, checkout time is 11:00 AM the day of discharge.   Patients discharged the day of surgery will not be allowed to drive home.  Name and phone number of your driver:  Special Instructions: N/A   Please read over the following fact sheets that you were given: MRSA Information.  Call Cain Sieve RN pre op nurse if needed [phone]    FAILURE TO FOLLOW THESE INSTRUCTIONS MAY RESULT IN THE CANCELLATION OF YOUR SURGERY. PATIENT SIGNATURE___________________________________________

## 2012-04-12 ENCOUNTER — Ambulatory Visit (HOSPITAL_COMMUNITY): Payer: Medicare Other | Admitting: *Deleted

## 2012-04-14 ENCOUNTER — Ambulatory Visit (HOSPITAL_COMMUNITY): Payer: Medicare Other | Admitting: Physical Therapy

## 2012-04-20 ENCOUNTER — Inpatient Hospital Stay (HOSPITAL_COMMUNITY)
Admission: RE | Admit: 2012-04-20 | Discharge: 2012-04-22 | DRG: 491 | Disposition: A | Discharge status: Home Health | Payer: Medicare Other | Source: Ambulatory Visit | Attending: Orthopedic Surgery | Admitting: Orthopedic Surgery

## 2012-04-20 ENCOUNTER — Ambulatory Visit (HOSPITAL_COMMUNITY): Payer: Medicare Other | Admitting: Anesthesiology

## 2012-04-20 ENCOUNTER — Ambulatory Visit (HOSPITAL_COMMUNITY): Payer: Medicare Other

## 2012-04-20 ENCOUNTER — Encounter (HOSPITAL_COMMUNITY)
Admission: RE | Disposition: A | Discharge status: Home Health | Payer: Self-pay | Source: Ambulatory Visit | Attending: Orthopedic Surgery

## 2012-04-20 ENCOUNTER — Encounter (HOSPITAL_COMMUNITY): Payer: Self-pay | Admitting: *Deleted

## 2012-04-20 ENCOUNTER — Encounter (HOSPITAL_COMMUNITY): Payer: Self-pay | Admitting: Anesthesiology

## 2012-04-20 DIAGNOSIS — Z87891 Personal history of nicotine dependence: Secondary | ICD-10-CM

## 2012-04-20 DIAGNOSIS — Z6835 Body mass index (BMI) 35.0-35.9, adult: Secondary | ICD-10-CM

## 2012-04-20 DIAGNOSIS — Z9889 Other specified postprocedural states: Secondary | ICD-10-CM

## 2012-04-20 DIAGNOSIS — M5126 Other intervertebral disc displacement, lumbar region: Secondary | ICD-10-CM | POA: Diagnosis present

## 2012-04-20 DIAGNOSIS — J4489 Other specified chronic obstructive pulmonary disease: Secondary | ICD-10-CM | POA: Diagnosis present

## 2012-04-20 DIAGNOSIS — M549 Dorsalgia, unspecified: Secondary | ICD-10-CM | POA: Diagnosis present

## 2012-04-20 DIAGNOSIS — M48061 Spinal stenosis, lumbar region without neurogenic claudication: Principal | ICD-10-CM | POA: Diagnosis present

## 2012-04-20 DIAGNOSIS — D649 Anemia, unspecified: Secondary | ICD-10-CM | POA: Diagnosis present

## 2012-04-20 DIAGNOSIS — M216X9 Other acquired deformities of unspecified foot: Secondary | ICD-10-CM | POA: Diagnosis present

## 2012-04-20 DIAGNOSIS — F411 Generalized anxiety disorder: Secondary | ICD-10-CM | POA: Diagnosis present

## 2012-04-20 DIAGNOSIS — G8929 Other chronic pain: Secondary | ICD-10-CM | POA: Diagnosis present

## 2012-04-20 DIAGNOSIS — J449 Chronic obstructive pulmonary disease, unspecified: Secondary | ICD-10-CM | POA: Diagnosis present

## 2012-04-20 DIAGNOSIS — Z79899 Other long term (current) drug therapy: Secondary | ICD-10-CM

## 2012-04-20 DIAGNOSIS — E669 Obesity, unspecified: Secondary | ICD-10-CM | POA: Diagnosis present

## 2012-04-20 DIAGNOSIS — I10 Essential (primary) hypertension: Secondary | ICD-10-CM | POA: Diagnosis present

## 2012-04-20 HISTORY — PX: DECOMPRESSIVE LUMBAR LAMINECTOMY LEVEL 2: SHX5792

## 2012-04-20 LAB — TYPE AND SCREEN
ABO/RH(D): B POS
Antibody Screen: NEGATIVE

## 2012-04-20 SURGERY — DECOMPRESSIVE LUMBAR LAMINECTOMY LEVEL 2
Anesthesia: General | Site: Spine Lumbar | Laterality: Right | Wound class: Clean

## 2012-04-20 MED ORDER — FENTANYL CITRATE 0.05 MG/ML IJ SOLN
INTRAMUSCULAR | Status: DC | PRN
  Administered 2012-04-20 (×2): 25 ug via INTRAVENOUS
  Administered 2012-04-20: 50 ug via INTRAVENOUS
  Administered 2012-04-20: 25 ug via INTRAVENOUS
  Administered 2012-04-20 (×2): 100 ug via INTRAVENOUS
  Administered 2012-04-20 (×4): 50 ug via INTRAVENOUS

## 2012-04-20 MED ORDER — ACETAMINOPHEN 325 MG PO TABS: 650.0000 mg | ORAL_TABLET | ORAL | Status: DC | PRN

## 2012-04-20 MED ORDER — ALBUTEROL SULFATE HFA 108 (90 BASE) MCG/ACT IN AERS
2.0000 | INHALATION_SPRAY | Freq: Four times a day (QID) | RESPIRATORY_TRACT | Status: DC | PRN
  Administered 2012-04-21: 2 via RESPIRATORY_TRACT
  Filled 2012-04-20: qty 6.7

## 2012-04-20 MED ORDER — KETAMINE HCL 50 MG/ML IJ SOLN
INTRAMUSCULAR | Status: DC | PRN
  Administered 2012-04-20: 2 mg via INTRAMUSCULAR
  Administered 2012-04-20: 2 mg via INTRAVENOUS
  Administered 2012-04-20 (×6): 1 mg via INTRAMUSCULAR
  Administered 2012-04-20 (×2): 2 mg via INTRAMUSCULAR
  Administered 2012-04-20 (×2): 1 mg via INTRAMUSCULAR

## 2012-04-20 MED ORDER — POLYETHYLENE GLYCOL 3350 17 G PO PACK: 17.0000 g | PACK | Freq: Every day | ORAL | Status: DC | PRN

## 2012-04-20 MED ORDER — MENTHOL 3 MG MT LOZG: 1.0000 | LOZENGE | OROMUCOSAL | Status: DC | PRN

## 2012-04-20 MED ORDER — OXYCODONE-ACETAMINOPHEN 5-325 MG PO TABS
1.0000 | ORAL_TABLET | ORAL | Status: DC | PRN
  Administered 2012-04-20 – 2012-04-21 (×2): 1 via ORAL
  Administered 2012-04-21: 2 via ORAL
  Administered 2012-04-21 (×4): 1 via ORAL
  Administered 2012-04-22 (×3): 2 via ORAL
  Filled 2012-04-20: qty 2
  Filled 2012-04-20 (×2): qty 1
  Filled 2012-04-20: qty 2
  Filled 2012-04-20: qty 1
  Filled 2012-04-20 (×2): qty 2
  Filled 2012-04-20: qty 1
  Filled 2012-04-20: qty 2
  Filled 2012-04-20: qty 1

## 2012-04-20 MED ORDER — FLUTICASONE PROPIONATE HFA 44 MCG/ACT IN AERO
1.0000 | INHALATION_SPRAY | Freq: Two times a day (BID) | RESPIRATORY_TRACT | Status: DC
  Administered 2012-04-20 – 2012-04-22 (×3): 1 via RESPIRATORY_TRACT
  Filled 2012-04-20: qty 10.6

## 2012-04-20 MED ORDER — ONDANSETRON HCL 4 MG/2ML IJ SOLN
INTRAMUSCULAR | Status: DC | PRN
  Administered 2012-04-20: 4 mg via INTRAVENOUS

## 2012-04-20 MED ORDER — ROCURONIUM BROMIDE 100 MG/10ML IV SOLN
INTRAVENOUS | Status: DC | PRN
  Administered 2012-04-20: 50 mg via INTRAVENOUS

## 2012-04-20 MED ORDER — HYDROMORPHONE HCL PF 1 MG/ML IJ SOLN
0.2500 mg | INTRAMUSCULAR | Status: DC | PRN
  Administered 2012-04-20: 0.25 mg via INTRAVENOUS

## 2012-04-20 MED ORDER — HYDROMORPHONE HCL PF 1 MG/ML IJ SOLN
INTRAMUSCULAR | Status: AC
  Filled 2012-04-20: qty 1

## 2012-04-20 MED ORDER — HYDROMORPHONE HCL PF 1 MG/ML IJ SOLN
INTRAMUSCULAR | Status: DC | PRN
  Administered 2012-04-20: 0.5 mg via INTRAVENOUS

## 2012-04-20 MED ORDER — LACTATED RINGERS IV SOLN
INTRAVENOUS | Status: DC
  Administered 2012-04-20 (×2): 1000 mL via INTRAVENOUS

## 2012-04-20 MED ORDER — LACTATED RINGERS IV SOLN: INTRAVENOUS | Status: DC

## 2012-04-20 MED ORDER — ALPRAZOLAM 0.5 MG PO TABS
0.5000 mg | ORAL_TABLET | Freq: Three times a day (TID) | ORAL | Status: DC | PRN
  Administered 2012-04-21 (×3): 0.5 mg via ORAL
  Filled 2012-04-20 (×3): qty 1

## 2012-04-20 MED ORDER — HYDROMORPHONE HCL PF 1 MG/ML IJ SOLN: 0.5000 mg | INTRAMUSCULAR | Status: DC | PRN

## 2012-04-20 MED ORDER — METOCLOPRAMIDE HCL 5 MG/ML IJ SOLN
INTRAMUSCULAR | Status: DC | PRN
  Administered 2012-04-20: 10 mg via INTRAVENOUS

## 2012-04-20 MED ORDER — PHENYLEPHRINE HCL 10 MG/ML IJ SOLN
INTRAMUSCULAR | Status: DC | PRN
  Administered 2012-04-20 (×3): 40 ug via INTRAVENOUS
  Administered 2012-04-20: 80 ug via INTRAVENOUS
  Administered 2012-04-20: 40 ug via INTRAVENOUS
  Administered 2012-04-20: 80 ug via INTRAVENOUS
  Administered 2012-04-20: 40 ug via INTRAVENOUS
  Administered 2012-04-20: 80 ug via INTRAVENOUS
  Administered 2012-04-20 (×2): 40 ug via INTRAVENOUS

## 2012-04-20 MED ORDER — PHENYLEPHRINE HCL 10 MG/ML IJ SOLN
10.0000 mg | INTRAVENOUS | Status: DC | PRN
  Administered 2012-04-20: 60 ug/min via INTRAVENOUS

## 2012-04-20 MED ORDER — HEMOSTATIC AGENTS (NO CHARGE) OPTIME
TOPICAL | Status: DC | PRN
  Administered 2012-04-20: 1 via TOPICAL

## 2012-04-20 MED ORDER — CEFAZOLIN SODIUM 1-5 GM-% IV SOLN
1.0000 g | Freq: Three times a day (TID) | INTRAVENOUS | Status: AC
  Administered 2012-04-20 – 2012-04-21 (×3): 1 g via INTRAVENOUS
  Filled 2012-04-20 (×3): qty 50

## 2012-04-20 MED ORDER — PROPOFOL 10 MG/ML IV BOLUS
INTRAVENOUS | Status: DC | PRN
  Administered 2012-04-20: 150 mg via INTRAVENOUS

## 2012-04-20 MED ORDER — THROMBIN 5000 UNITS EX SOLR
CUTANEOUS | Status: DC | PRN
  Administered 2012-04-20: 10000 [IU] via TOPICAL

## 2012-04-20 MED ORDER — BACITRACIN ZINC 500 UNIT/GM EX OINT
TOPICAL_OINTMENT | CUTANEOUS | Status: AC
  Filled 2012-04-20: qty 15

## 2012-04-20 MED ORDER — FLEET ENEMA 7-19 GM/118ML RE ENEM: 1.0000 | ENEMA | Freq: Once | RECTAL | Status: AC | PRN

## 2012-04-20 MED ORDER — SODIUM CHLORIDE 0.9 % IJ SOLN
INTRAMUSCULAR | Status: DC | PRN
  Administered 2012-04-20: 10 mL

## 2012-04-20 MED ORDER — MIDAZOLAM HCL 5 MG/5ML IJ SOLN
INTRAMUSCULAR | Status: DC | PRN
  Administered 2012-04-20: 2 mg via INTRAVENOUS

## 2012-04-20 MED ORDER — THROMBIN 5000 UNITS EX SOLR
CUTANEOUS | Status: AC
  Filled 2012-04-20: qty 10000

## 2012-04-20 MED ORDER — CEFAZOLIN SODIUM-DEXTROSE 2-3 GM-% IV SOLR
INTRAVENOUS | Status: AC
  Filled 2012-04-20: qty 50

## 2012-04-20 MED ORDER — SODIUM CHLORIDE 0.9 % IR SOLN
Status: DC | PRN
  Administered 2012-04-20: 16:00:00

## 2012-04-20 MED ORDER — ONDANSETRON HCL 4 MG/2ML IJ SOLN: 4.0000 mg | INTRAMUSCULAR | Status: DC | PRN

## 2012-04-20 MED ORDER — BACITRACIN-NEOMYCIN-POLYMYXIN 400-5-5000 EX OINT
TOPICAL_OINTMENT | CUTANEOUS | Status: DC | PRN
  Administered 2012-04-20: 1 via TOPICAL

## 2012-04-20 MED ORDER — LABETALOL HCL 5 MG/ML IV SOLN
INTRAVENOUS | Status: DC | PRN
  Administered 2012-04-20: 5 mg via INTRAVENOUS

## 2012-04-20 MED ORDER — DEXAMETHASONE SODIUM PHOSPHATE 10 MG/ML IJ SOLN
INTRAMUSCULAR | Status: DC | PRN
  Administered 2012-04-20: 10 mg via INTRAVENOUS

## 2012-04-20 MED ORDER — ACETAMINOPHEN 10 MG/ML IV SOLN
INTRAVENOUS | Status: AC
  Filled 2012-04-20: qty 100

## 2012-04-20 MED ORDER — METHOCARBAMOL 500 MG PO TABS
500.0000 mg | ORAL_TABLET | Freq: Four times a day (QID) | ORAL | Status: DC | PRN
  Administered 2012-04-21 (×3): 500 mg via ORAL
  Filled 2012-04-20 (×3): qty 1

## 2012-04-20 MED ORDER — HETASTARCH-ELECTROLYTES 6 % IV SOLN
INTRAVENOUS | Status: DC | PRN
  Administered 2012-04-20: 17:00:00 via INTRAVENOUS

## 2012-04-20 MED ORDER — GLYCOPYRROLATE 0.2 MG/ML IJ SOLN
INTRAMUSCULAR | Status: DC | PRN
  Administered 2012-04-20: .8 mg via INTRAVENOUS

## 2012-04-20 MED ORDER — PHENOL 1.4 % MT LIQD: 1.0000 | OROMUCOSAL | Status: DC | PRN

## 2012-04-20 MED ORDER — METHOCARBAMOL 100 MG/ML IJ SOLN
500.0000 mg | Freq: Four times a day (QID) | INTRAVENOUS | Status: DC | PRN
  Administered 2012-04-20: 500 mg via INTRAVENOUS
  Filled 2012-04-20: qty 5

## 2012-04-20 MED ORDER — BISACODYL 10 MG RE SUPP: 10.0000 mg | Freq: Every day | RECTAL | Status: DC | PRN

## 2012-04-20 MED ORDER — PANTOPRAZOLE SODIUM 40 MG PO TBEC
40.0000 mg | DELAYED_RELEASE_TABLET | Freq: Every day | ORAL | Status: DC
  Administered 2012-04-21 – 2012-04-22 (×2): 40 mg via ORAL
  Filled 2012-04-20 (×2): qty 1

## 2012-04-20 MED ORDER — IRBESARTAN 75 MG PO TABS
75.0000 mg | ORAL_TABLET | Freq: Every day | ORAL | Status: DC
  Administered 2012-04-20 – 2012-04-22 (×3): 75 mg via ORAL
  Filled 2012-04-20 (×4): qty 1

## 2012-04-20 MED ORDER — FLUTICASONE PROPIONATE 50 MCG/ACT NA SUSP
2.0000 | Freq: Every morning | NASAL | Status: DC
  Administered 2012-04-21: 2 via NASAL
  Filled 2012-04-20: qty 16

## 2012-04-20 MED ORDER — BUPIVACAINE LIPOSOME 1.3 % IJ SUSP
20.0000 mL | Freq: Once | INTRAMUSCULAR | Status: AC
  Administered 2012-04-20: 20 mL
  Filled 2012-04-20: qty 20

## 2012-04-20 MED ORDER — ACETAMINOPHEN 10 MG/ML IV SOLN
INTRAVENOUS | Status: DC | PRN
  Administered 2012-04-20: 1000 mg via INTRAVENOUS

## 2012-04-20 MED ORDER — NEOSTIGMINE METHYLSULFATE 1 MG/ML IJ SOLN
INTRAMUSCULAR | Status: DC | PRN
  Administered 2012-04-20: 5 mg via INTRAVENOUS

## 2012-04-20 MED ORDER — ACETAMINOPHEN 650 MG RE SUPP: 650.0000 mg | RECTAL | Status: DC | PRN

## 2012-04-20 MED ORDER — HYDROCODONE-ACETAMINOPHEN 5-325 MG PO TABS: 1.0000 | ORAL_TABLET | ORAL | Status: DC | PRN

## 2012-04-20 MED ORDER — MONTELUKAST SODIUM 10 MG PO TABS
10.0000 mg | ORAL_TABLET | Freq: Every day | ORAL | Status: DC
  Administered 2012-04-20 – 2012-04-21 (×2): 10 mg via ORAL
  Filled 2012-04-20 (×3): qty 1

## 2012-04-20 MED ORDER — BUPIVACAINE-EPINEPHRINE (PF) 0.5% -1:200000 IJ SOLN
INTRAMUSCULAR | Status: AC
  Filled 2012-04-20: qty 10

## 2012-04-20 MED ORDER — CEFAZOLIN SODIUM-DEXTROSE 2-3 GM-% IV SOLR
2.0000 g | INTRAVENOUS | Status: AC
  Administered 2012-04-20: 2 g via INTRAVENOUS

## 2012-04-20 SURGICAL SUPPLY — 52 items
APL SKNCLS STERI-STRIP NONHPOA (GAUZE/BANDAGES/DRESSINGS) ×1
BAG SPEC THK2 15X12 ZIP CLS (MISCELLANEOUS) ×3
BAG ZIPLOCK 12X15 (MISCELLANEOUS) ×4 IMPLANT
BENZOIN TINCTURE PRP APPL 2/3 (GAUZE/BANDAGES/DRESSINGS) ×2 IMPLANT
CLEANER TIP ELECTROSURG 2X2 (MISCELLANEOUS) ×2 IMPLANT
CLOTH BEACON ORANGE TIMEOUT ST (SAFETY) ×2 IMPLANT
CONT SPECI 4OZ STER CLIK (MISCELLANEOUS) ×2 IMPLANT
DRAIN PENROSE 18X1/4 LTX STRL (WOUND CARE) IMPLANT
DRAPE MICROSCOPE LEICA (MISCELLANEOUS) ×2 IMPLANT
DRAPE POUCH INSTRU U-SHP 10X18 (DRAPES) ×2 IMPLANT
DRAPE SURG 17X11 SM STRL (DRAPES) ×3 IMPLANT
DRSG ADAPTIC 3X8 NADH LF (GAUZE/BANDAGES/DRESSINGS) ×2 IMPLANT
DRSG PAD ABDOMINAL 8X10 ST (GAUZE/BANDAGES/DRESSINGS) ×1 IMPLANT
DURAPREP 26ML APPLICATOR (WOUND CARE) ×2 IMPLANT
ELECT BLADE TIP CTD 4 INCH (ELECTRODE) ×1 IMPLANT
ELECT REM PT RETURN 9FT ADLT (ELECTROSURGICAL) ×2
ELECTRODE REM PT RTRN 9FT ADLT (ELECTROSURGICAL) ×1 IMPLANT
FLOSEAL 10ML (HEMOSTASIS) ×2 IMPLANT
GLOVE BIOGEL PI IND STRL 8 (GLOVE) ×1 IMPLANT
GLOVE BIOGEL PI IND STRL 8.5 (GLOVE) ×1 IMPLANT
GLOVE BIOGEL PI INDICATOR 8 (GLOVE) ×1
GLOVE BIOGEL PI INDICATOR 8.5 (GLOVE) ×1
GLOVE ECLIPSE 8.0 STRL XLNG CF (GLOVE) ×4 IMPLANT
GOWN PREVENTION PLUS LG XLONG (DISPOSABLE) ×6 IMPLANT
GOWN STRL REIN XL XLG (GOWN DISPOSABLE) ×4 IMPLANT
KIT BASIN OR (CUSTOM PROCEDURE TRAY) ×2 IMPLANT
KIT POSITIONING SURG ANDREWS (MISCELLANEOUS) ×1 IMPLANT
MANIFOLD NEPTUNE II (INSTRUMENTS) ×2 IMPLANT
NDL SPNL 18GX3.5 QUINCKE PK (NEEDLE) ×2 IMPLANT
NEEDLE SPNL 18GX3.5 QUINCKE PK (NEEDLE) ×6 IMPLANT
NS IRRIG 1000ML POUR BTL (IV SOLUTION) ×1 IMPLANT
PATTIES SURGICAL .5 X.5 (GAUZE/BANDAGES/DRESSINGS) ×1 IMPLANT
PATTIES SURGICAL .75X.75 (GAUZE/BANDAGES/DRESSINGS) IMPLANT
PATTIES SURGICAL 1X1 (DISPOSABLE) IMPLANT
PIN SAFETY NICK PLATE  2 MED (MISCELLANEOUS)
PIN SAFETY NICK PLATE 2 MED (MISCELLANEOUS) IMPLANT
POSITIONER SURGICAL ARM (MISCELLANEOUS) ×2 IMPLANT
SPONGE GAUZE 4X4 12PLY (GAUZE/BANDAGES/DRESSINGS) ×1 IMPLANT
SPONGE LAP 18X18 X RAY DECT (DISPOSABLE) ×1 IMPLANT
SPONGE LAP 4X18 X RAY DECT (DISPOSABLE) IMPLANT
SPONGE SURGIFOAM ABS GEL 100 (HEMOSTASIS) ×2 IMPLANT
STAPLER VISISTAT 35W (STAPLE) IMPLANT
SUT VIC AB 0 CT1 27 (SUTURE) ×2
SUT VIC AB 0 CT1 27XBRD ANTBC (SUTURE) ×1 IMPLANT
SUT VIC AB 1 CT1 27 (SUTURE) ×8
SUT VIC AB 1 CT1 27XBRD ANTBC (SUTURE) ×4 IMPLANT
TAPE CLOTH SURG 6X10 WHT LF (GAUZE/BANDAGES/DRESSINGS) ×1 IMPLANT
TOWEL OR 17X26 10 PK STRL BLUE (TOWEL DISPOSABLE) ×5 IMPLANT
TRAY FOLEY CATH 14FRSI W/METER (CATHETERS) ×2 IMPLANT
TRAY LAMINECTOMY (CUSTOM PROCEDURE TRAY) ×2 IMPLANT
WATER STERILE IRR 1500ML POUR (IV SOLUTION) ×1 IMPLANT
WATER STERILE IRR 500ML POUR (IV SOLUTION) ×1 IMPLANT

## 2012-04-20 NOTE — Interval H&P Note (Signed)
History and Physical Interval Note:  04/20/2012 2:27 PM  Glenda Terry  has presented today for surgery, with the diagnosis of herniated disc and spinal stenosis  The various methods of treatment have been discussed with the patient and family. After consideration of risks, benefits and other options for treatment, the patient has consented to  Procedure(s) (LRB) with comments: DECOMPRESSIVE LUMBAR LAMINECTOMY LEVEL 2 (Right) - Decompressive Lumbar Laminectomy of the L4 - L5 and L5 - S1 Complete/Laminectomy L5 on the Right (X-Ray) as a surgical intervention .  The patient's history has been reviewed, patient examined, no change in status, stable for surgery.  I have reviewed the patient's chart and labs.  Questions were answered to the patient's satisfaction.     [name]

## 2012-04-20 NOTE — Transfer of Care (Signed)
Immediate Anesthesia Transfer of Care Note  Patient: Glenda Terry  Procedure(s) Performed: Procedure(s) (LRB) with comments: DECOMPRESSIVE LUMBAR LAMINECTOMY LEVEL 2 (Right) - Decompressive Lumbar Laminectomy of the L4 - L5 and L5 - S1 Complete/Laminectomy L5 on the Right (X-Ray)  Patient Location: PACU  Anesthesia Type:General  Level of Consciousness: patient cooperative and responds to stimulation, drowsey, ventilating well  Airway & Oxygen Therapy: Patient Spontanous Breathing and Patient connected to face mask oxygen  Post-op Assessment: Report given to PACU RN, Post -op Vital signs reviewed and stable and Patient moving all extremities X 4  Post vital signs: Reviewed and stable  Complications: No apparent anesthesia complications

## 2012-04-20 NOTE — Anesthesia Preprocedure Evaluation (Addendum)
Anesthesia Evaluation  Patient identified by MRN, date of birth, ID band Patient awake    Reviewed: Allergy & Precautions, H&P , NPO status , Patient's Chart, lab work & pertinent test results  Airway Mallampati: II TM Distance: >3 FB Neck ROM: full    Dental No notable dental hx. (+) Edentulous Upper and Dental Advisory Given   Pulmonary neg pulmonary ROS, shortness of breath and with exertion, asthma , COPD COPD inhaler,  breath sounds clear to auscultation  Pulmonary exam normal       Cardiovascular Exercise Tolerance: Good hypertension, Pt. on medications negative cardio ROS  Rhythm:regular Rate:Normal     Neuro/Psych negative neurological ROS  negative psych ROS   GI/Hepatic negative GI ROS, Neg liver ROS, GERD-  Medicated and Controlled,  Endo/Other  negative endocrine ROS  Renal/GU negative Renal ROS  negative genitourinary   Musculoskeletal   Abdominal   Peds  Hematology negative hematology ROS (+) Blood dyscrasia, anemia , hgb 9.9   Anesthesia Other Findings   Reproductive/Obstetrics negative OB ROS                          Anesthesia Physical Anesthesia Plan  ASA: III  Anesthesia Plan: General   Post-op Pain Management:    Induction: Intravenous  Airway Management Planned: Oral ETT  Additional Equipment:   Intra-op Plan:   Post-operative Plan: Extubation in OR  Informed Consent: I have reviewed the patients History and Physical, chart, labs and discussed the procedure including the risks, benefits and alternatives for the proposed anesthesia with the patient or authorized representative who has indicated his/her understanding and acceptance.   Dental Advisory Given  Plan Discussed with: CRNA and Surgeon  Anesthesia Plan Comments:         Anesthesia Quick Evaluation

## 2012-04-20 NOTE — Brief Op Note (Signed)
04/20/2012  5:39 PM  PATIENT:  Glenda Terry  72 y.o. female  PRE-OPERATIVE DIAGNOSIS:  herniated disc and spinal stenosis,L-4-L-5- and L-5-S-1 and foot drop on right. POST-OPERATIVE DIAGNOSIS:  herniated disc and spinal stenosis,L-4-L-5 and L-5-S-1and Foot Drop on Right.  PROCEDURE:  Procedure(s) (LRB) with comments: DECOMPRESSIVE LUMBAR LAMINECTOMY LEVEL 2 (Right) - Decompressive Lumbar Laminectomy of the L4 - L5 and L5 - S1 Complete/Laminectomy L5 on the Right (X-Ray) and Microdiscectomy at L-4-L-5  SURGEON:  Surgeon(s) and Role:    * Jacki Cones, MD - Primary    * Drucilla Schmidt, MD - Assisting     ASSISTANTS:James Aplington MD   ANESTHESIA:   general  EBL:  Total I/O In: 1300 [I.V.:800; IV Piggyback:500] Out: 530 [Urine:180; Blood:350]  BLOOD ADMINISTERED:none  DRAINS: none   LOCAL MEDICATIONS USED:  BUPIVICAINE 20cc mixed with 10cc of Normal Saline.  SPECIMEN:  No Specimen  DISPOSITION OF SPECIMEN:  N/A  COUNTS:  YES  TOURNIQUET:  * No tourniquets in log *  DICTATION: .Other Dictation: Dictation Number [id number]  PLAN OF CARE: Admit to inpatient   PATIENT DISPOSITION:  PACU - hemodynamically stable.   Delay start of Pharmacological VTE agent (>24hrs) due to surgical blood loss or risk of bleeding: yes

## 2012-04-21 ENCOUNTER — Encounter (HOSPITAL_COMMUNITY): Payer: Self-pay | Admitting: Orthopedic Surgery

## 2012-04-21 LAB — HEMOGLOBIN AND HEMATOCRIT, BLOOD
HCT: 27.5 % — ABNORMAL LOW (ref 36.0–46.0)
Hemoglobin: 8.4 g/dL — ABNORMAL LOW (ref 12.0–15.0)

## 2012-04-21 LAB — CBC
HCT: 27.1 % — ABNORMAL LOW (ref 36.0–46.0)
Hemoglobin: 8.3 g/dL — ABNORMAL LOW (ref 12.0–15.0)
MCH: 22.6 pg — ABNORMAL LOW (ref 26.0–34.0)
RBC: 3.67 MIL/uL — ABNORMAL LOW (ref 3.87–5.11)

## 2012-04-21 MED ORDER — METHOCARBAMOL 500 MG PO TABS: 500.0000 mg | ORAL_TABLET | Freq: Four times a day (QID) | ORAL | Status: DC | PRN

## 2012-04-21 MED ORDER — OXYCODONE-ACETAMINOPHEN 5-325 MG PO TABS: 1.0000 | ORAL_TABLET | ORAL | Status: DC | PRN

## 2012-04-21 NOTE — Evaluation (Signed)
Occupational Therapy Evaluation Patient Details Name: Glenda Terry MRN: [id number] DOB: 08-04-1940 Today's Date: 04/21/2012 Time: 4098-1191 OT Time Calculation (min): 24 min  OT Assessment / Plan / Recommendation Clinical Impression  Pt presents to OT s/p back surgery. Pt with decreased I with ADL activity s/p back surgery.  Pt will benefit from skilled OT to increase I with ADL activity and follow back precautions, as well as decrease burden on daugther    OT Assessment  Patient needs continued OT Services    Follow Up Recommendations  Home health OT       Equipment Recommendations  None recommended by OT       Frequency  Min 2X/week    Precautions / Restrictions Precautions Precautions: Back       ADL  Grooming: Performed;Min guard Where Assessed - Grooming: Unsupported standing Upper Body Bathing: Performed;Set up Where Assessed - Upper Body Bathing: Unsupported sitting Lower Body Bathing: Performed;Minimal assistance Where Assessed - Lower Body Bathing: Unsupported sit to stand Upper Body Dressing: Performed;Supervision/safety Where Assessed - Upper Body Dressing: Unsupported standing Lower Body Dressing: Performed;Moderate assistance Where Assessed - Lower Body Dressing: Unsupported sit to stand ADL Comments: Pt standing to perform bath with OT. Pt did well remembering and acknowledging back precautions.  Daugther will assist pt as home as needed    OT Diagnosis: Generalized weakness;Acute pain  OT Problem List: Decreased strength;Pain;Decreased knowledge of use of DME or AE OT Treatment Interventions: Self-care/ADL training;Patient/family education   OT Goals Acute Rehab OT Goals OT Goal Formulation: With patient Time For Goal Achievement: 05/05/12 Potential to Achieve Goals: Good ADL Goals Pt Will Perform Grooming: with modified independence;Standing at sink ADL Goal: Grooming - Progress: Goal set today Pt Will Perform Lower Body Dressing: with modified  independence;Sit to stand from chair;with adaptive equipment ADL Goal: Lower Body Dressing - Progress: Goal set today Pt Will Transfer to Toilet: with modified independence;Raised toilet seat with arms;Maintaining back safety precautions ADL Goal: Toilet Transfer - Progress: Goal set today Pt Will Perform Toileting - Clothing Manipulation: with modified independence;Standing ADL Goal: Toileting - Clothing Manipulation - Progress: Goal set today  Visit Information  Last OT Received On: 04/21/12    Subjective Data  Subjective: I feel like i am doing ok   Prior Functioning     Home Living Lives With: Family;Daughter Available Help at Discharge: Family Type of Home: House Bathroom Shower/Tub: Tub/shower unit Bathroom Toilet: Handicapped height Home Adaptive Equipment: Reacher;Tub transfer bench;Bedside commode/3-in-1;Long-handled shoehorn Prior Function Level of Independence: Independent Communication Communication: No difficulties         Vision/Perception Vision - History Patient Visual Report: No change from baseline   Cognition  Cognition Overall Cognitive Status: Appears within functional limits for tasks assessed/performed Arousal/Alertness: Awake/alert Orientation Level: Appears intact for tasks assessed    Extremity/Trunk Assessment Right Upper Extremity Assessment RUE ROM/Strength/Tone: [facility] for tasks assessed Left Upper Extremity Assessment LUE ROM/Strength/Tone: WFL for tasks assessed     Mobility Transfers Transfers: Sit to Stand;Stand to Sit Sit to Stand: 4: Min guard;With upper extremity assist;From chair/3-in-1 Stand to Sit: To chair/3-in-1;With armrests;4: Min assist           End of Session OT - End of Session Activity Tolerance: Patient tolerated treatment well Patient left: in chair Nurse Communication: Mobility status;Patient requests pain meds  GO     Alba Cory 04/21/2012, 9:52 AM

## 2012-04-21 NOTE — Evaluation (Signed)
Physical Therapy Evaluation Patient Details Name: Glenda Terry MRN: [id number] DOB: [birth year] Today's Date: 04/21/2012 Time: 0830-0908 PT Time Calculation (min): 38 min  PT Assessment / Plan / Recommendation Clinical Impression       PT Assessment       Follow Up Recommendations  No PT follow up    Does the patient have the potential to tolerate intense rehabilitation      Barriers to Discharge        Equipment Recommendations  None recommended by PT    Recommendations for Other Services OT consult   Frequency Min 6X/week    Precautions / Restrictions Precautions Precautions: Back Precaution Comments: handout provided Restrictions Weight Bearing Restrictions: No   Pertinent Vitals/Pain 4/10; premed      Mobility  Bed Mobility Bed Mobility: Rolling Right;Right Sidelying to Sit Rolling Right: 4: Min guard Right Sidelying to Sit: 4: Min guard Details for Bed Mobility Assistance: cues for sequence and correct log roll technique Transfers Transfers: Sit to Stand;Stand to Sit Sit to Stand: 4: Min guard;With upper extremity assist;From chair/3-in-1 Stand to Sit: To chair/3-in-1;With armrests;4: Min assist Details for Transfer Assistance: cues for LE position and use of UEs to self assist Ambulation/Gait Ambulation/Gait Assistance: 4: Min assist;4: Min guard Ambulation Distance (Feet): 147 Feet Assistive device: Rolling walker Ambulation/Gait Assistance Details: cues for posture, sequence and position from RW Gait Pattern: Step-through pattern Stairs: No    Exercises General Exercises - Lower Extremity Ankle Circles/Pumps: AROM;Both;15 reps;Supine   PT Diagnosis:    PT Problem List:   PT Treatment Interventions:     PT Goals Acute Rehab PT Goals PT Goal Formulation: With patient Time For Goal Achievement: 04/24/12 Potential to Achieve Goals: Good Pt will go Supine/Side to Sit: with supervision PT Goal: Supine/Side to Sit - Progress: Goal set  today Pt will go Sit to Supine/Side: with supervision PT Goal: Sit to Supine/Side - Progress: Goal set today Pt will go Sit to Stand: with supervision PT Goal: Sit to Stand - Progress: Goal set today Pt will go Stand to Sit: with supervision PT Goal: Stand to Sit - Progress: Goal set today Pt will Ambulate: >150 feet;with rolling walker;with supervision PT Goal: Ambulate - Progress: Goal set today Pt will Go Up / Down Stairs: 6-9 stairs;with min assist;with least restrictive assistive device PT Goal: Up/Down Stairs - Progress: Goal set today  Visit Information  Last PT Received On: 04/21/12 Assistance Needed: +1    Subjective Data  Subjective: I wasn't able to do my therapy after my knee replacement because of my back pain Patient Stated Goal: Resume previous lifestyle with decreased pain   Prior Functioning  Home Living Lives With: Family;Daughter Available Help at Discharge: Family Type of Home: House Home Access: Stairs to enter Secretary/administrator of Steps: 6 Entrance Stairs-Rails: Right Home Layout: One level Bathroom Shower/Tub: Engineer, manufacturing systems: Handicapped height Home Adaptive Equipment: Reacher;Tub transfer bench;Bedside commode/3-in-1;Long-handled shoehorn Prior Function Level of Independence: Independent Able to Take Stairs?: Yes Driving: Yes Vocation: Retired Musician: No difficulties    Copywriter, advertising Overall Cognitive Status: Appears within functional limits for tasks assessed/performed Arousal/Alertness: Awake/alert Orientation Level: Appears intact for tasks assessed Behavior During Session: Glenda Terry for tasks performed    Extremity/Trunk Assessment Right Upper Extremity Assessment RUE ROM/Strength/Tone: [facility] for tasks assessed Left Upper Extremity Assessment LUE ROM/Strength/Tone: [facility] for tasks assessed Right Lower Extremity Assessment RLE ROM/Strength/Tone: [facility] for tasks assessed Left Lower Extremity  Assessment LLE ROM/Strength/Tone: [facility] for  tasks assessed   Balance    End of Session PT - End of Session Activity Tolerance: Patient tolerated treatment well Patient left: in chair;with call bell/phone within reach Nurse Communication: Mobility status  GP     Glenda Terry 04/21/2012, 10:16 AM

## 2012-04-21 NOTE — Progress Notes (Signed)
   Subjective: 1 Day Post-Op Procedure(s) (LRB): DECOMPRESSIVE LUMBAR LAMINECTOMY LEVEL 2 (Right) Patient reports pain as mild.   Patient seen in rounds with Dr. Darrelyn Hillock. Patient is well, and has had no acute complaints or problems. She states that her back pain and leg pain are gone. She has only has some stiffness in her back. Voiding well. No issues overnight. Denies chest pain and shortness of breath.  Plan is to go Home after hospital stay.  Objective: Vital signs in last 24 hours: Temp:  [97.4 F (36.3 C)-98.5 F (36.9 C)] 98.5 F (36.9 C) (02/07 0559) Pulse Rate:  [54-94] 86  (02/07 0559) Resp:  [12-20] 16  (02/07 0559) BP: (118-166)/(70-99) 118/73 mmHg (02/07 0559) SpO2:  [98 %-100 %] 100 % (02/07 0559) Weight:  [83.008 kg (183 lb)] 83.008 kg (183 lb) (02/06 1851)  Intake/Output from previous day:  Intake/Output Summary (Last 24 hours) at 04/21/12 0812 Last data filed at 04/21/12 0559  Gross per 24 hour  Intake   2840 ml  Output   1925 ml  Net    915 ml     Labs:  Basename 04/21/12 0355  HGB 8.3*    Basename 04/21/12 0355  WBC 6.7  RBC 3.67*  HCT 27.1*  PLT 289    EXAM General - Patient is Alert and Oriented Extremity - Neurologically intact Neurovascular intact Dorsiflexion/Plantar flexion intact Incision: dressing C/D/I Compartment soft Motor Function - intact, moving foot and toes well on exam.   Past Medical History  Diagnosis Date  . COPD (chronic obstructive pulmonary disease)   . Back pain, chronic   . Anxiety   . Hypertension   . GERD (gastroesophageal reflux disease)   . Shortness of breath     OCCASIONAL  . Arthritis   . Anemia NOV 2013  . Complication of anesthesia 1985    VITALS SIGNS DROPPED AND COULD NOT TAKE PAIN MEDS UNTIL VITALS IMPROVED    Assessment/Plan: 1 Day Post-Op Procedure(s) (LRB): DECOMPRESSIVE LUMBAR LAMINECTOMY LEVEL 2 (Right) Active Problems:  Spinal stenosis of lumbar region without neurogenic  claudication  Herniated lumbar intervertebral disc S/P lumbar decompression laminectomy  Estimated Body mass index is 35.74 kg/(m^2) as calculated from the following:   Height as of this encounter: 5\' 0" (1.524 m).   Weight as of this encounter: 183 lb(83.008 kg). Advance diet Up with therapy Plan for discharge tomorrow  DVT Prophylaxis - Aspirin Weight-Bearing as tolerated   We will continue to monitor her today. She will walk with therapy with a walker. Recheck Hgb this afternoon. Plan for discharge tomorrow.   Glenda Terry LAUREN 04/21/2012, 8:12 AM

## 2012-04-21 NOTE — Op Note (Signed)
NAMEMarland Kitchen  Glenda Terry, CONNORS NO.:  [id number]  MEDICAL RECORD NO.:  [id number]  LOCATION:  1615                         FACILITY:  [facility]  PHYSICIAN:  Georges Lynch. [name], M.D.DATE OF BIRTH:  07-16-1940  DATE OF PROCEDURE:  04/20/2012 DATE OF DISCHARGE:                              OPERATIVE REPORT   SURGEON:  Georges Lynch. Darrelyn Hillock, M.D.  ASSISTANT:  Marlowe Kays, M.D.  PREOPERATIVE DIAGNOSES: 1. Footdrop on the right. 2. Herniated lumbar disk at L4-5 on the right. 3. Severe spinal stenosis at L4-5. 4. Severe spinal stenosis at L5-S1 with foraminal stenosis.  POSTOPERATIVE DIAGNOSES: 1. Footdrop on the right. 2. Herniated lumbar disk at L4-5 on the right. 3. Severe spinal stenosis at L4-5. 4. Severe spinal stenosis at L5-S1 with foraminal stenosis.  OPERATION: 1. Complete decompressive lumbar laminectomy at L4-5 with spinal     stenosis. 2. Complete decompressive lumbar laminectomy at L5-S1 for spinal     stenosis. 3. Foraminotomies for the L4 root and L5 root on the right. 4. Microdiskectomy at L4-5 on the right.  PROCEDURE:  Under general anesthesia, routine orthopedic prep and draping of the lower back was carried out.  The patient was placed in spinal rolls.  She had 2 g of IV Ancef.  After sterile prep and draping was carried out, the appropriate time-out was carried out in the usual fashion.  I also marked the appropriate right side of the back where she had a footdrop preop in the preop holding area.  Then under general anesthesia, 2 needles were placed in the back for localization purposes. X-ray was taken.  Incision was made over the L4-5, and L5-S1 space. Bleeder was identified and cauterized.  Note, this was severely complex case.  She was obese, there was a large amount of fat in the lower back with the disk space down very deep compared to the normal situation.  I then did incised the lumbodorsal fascia, separated the muscle from spinous process  and lamina bilaterally.  Note, we had continuous low- grade oozing of blood during the procedure, most of this mainly because of her body weight.  We were able to easily control the bleeding.  We finally put 2 Kocher clamps, and an instrument to further identify the space, which we did.  We then inserted our McCullough retractors.  At this time, I removed the spinous process of L4 and L5 region and also 5- 1 region.  We then went down, decompressed the spinal canal, did a complete decompressive lumbar laminectomy for L4-5, L5-S1.  We brought the microscope and during the procedure identified the dura.  Great care was taken to protect the dura throughout the entire case.  Note, the canal was extremely tight at both levels.  We took a great deal of time, decompressed the canal.  We protected the dura with cottonoids at all time.  We went out into the foramina and decompressed 2 nerve roots.  We then went down, identified the L4-5 space, gently retracted the dura, made a cruciate incision in the posterior longitudinal ligament.  After spinal needle was placed in this space to make sure that this was the proper space.  Another x-ray was taken with  instruments in place.  We then made a cruciate incision in the posterior longitudinal ligament and did a microdiskectomy, most of this disk was hard disk.  We identified the foramina, I went out laterally and opened the foramina.  We went far distally and proximally until we had a nice open decompression.  The dura now was free, the roots were free.  We thoroughly irrigated out the area, and we did do the microdiskectomy.  I loosely injected some FloSeal, and later removed a portion of the dura, left side portions intact.  I then loosely applied some thrombin-soaked Gelfoam, closed the wound in layers in usual fashion.  I did leave the deep proximal and distal parts of the wound partially open for drainage purposes.  The skin was closed with metal  staples and prior to doing that, I injected my mixture of 20 mL of Exparel with 10 mL of normal saline.  The patient left the operative room in satisfactory condition. This was a complex, difficult case, but everything went well.          ______________________________ Georges Lynch. Darrelyn Hillock, M.D.     RAG/MEDQ  D:  04/20/2012  T:  04/21/2012  Job:  [id number]

## 2012-04-21 NOTE — Progress Notes (Signed)
Physical Therapy Treatment Patient Details Name: Glenda Terry MRN: [id number] DOB: 1940-07-06 Today's Date: 04/21/2012 Time: 2841-3244 PT Time Calculation (min): 23 min  PT Assessment / Plan / Recommendation Comments on Treatment Session  Pt assisted to position on L side with pillow between LEs and blankets at back for support.    Follow Up Recommendations  Home health PT     Does the patient have the potential to tolerate intense rehabilitation     Barriers to Discharge        Equipment Recommendations  None recommended by PT    Recommendations for Other Services OT consult  Frequency Min 6X/week   Plan Discharge plan remains appropriate    Precautions / Restrictions Precautions Precautions: Back Precaution Comments: handout provided Restrictions Weight Bearing Restrictions: No   Pertinent Vitals/Pain 3/10    Mobility  Bed Mobility Bed Mobility: Rolling Left;Sit to Sidelying Right Rolling Right: 4: Min guard Rolling Left: 4: Min guard Sit to Sidelying Right: 4: Min assist Details for Bed Mobility Assistance: cues for sequence and correct log roll technique Transfers Transfers: Sit to Stand;Stand to Sit Sit to Stand: 4: Min guard;With upper extremity assist;From chair/3-in-1 Stand to Sit: To bed;4: Min guard;With upper extremity assist Details for Transfer Assistance: cues for LE position and use of UEs to self assist Ambulation/Gait Ambulation/Gait Assistance: 4: Min assist;4: Min guard Ambulation Distance (Feet): 250 Feet Assistive device: Rolling walker Ambulation/Gait Assistance Details: cues for posture and position from RW Gait Pattern: Step-through pattern Gait velocity: slow and steady Stairs: No    Exercises     PT Diagnosis:    PT Problem List:   PT Treatment Interventions:     PT Goals Acute Rehab PT Goals PT Goal Formulation: With patient Time For Goal Achievement: 04/24/12 Potential to Achieve Goals: Good Pt will go Supine/Side to  Sit: with supervision PT Goal: Supine/Side to Sit - Progress: Progressing toward goal Pt will go Sit to Supine/Side: with supervision PT Goal: Sit to Supine/Side - Progress: Progressing toward goal Pt will go Sit to Stand: with supervision PT Goal: Sit to Stand - Progress: Progressing toward goal Pt will go Stand to Sit: with supervision PT Goal: Stand to Sit - Progress: Progressing toward goal Pt will Ambulate: >150 feet;with rolling walker;with supervision PT Goal: Ambulate - Progress: Progressing toward goal Pt will Go Up / Down Stairs: 6-9 stairs;with min assist;with least restrictive assistive device PT Goal: Up/Down Stairs - Progress: Goal set today  Visit Information  Last PT Received On: 04/21/12 Assistance Needed: +1    Subjective Data  Subjective: My back pain is getting less and less as I walk - it started at a 5 and now its a 3 Patient Stated Goal: Resume previous lifestyle with decreased pain   Cognition  Cognition Overall Cognitive Status: Appears within functional limits for tasks assessed/performed Arousal/Alertness: Awake/alert Orientation Level: Appears intact for tasks assessed Behavior During Session: [facility] for tasks performed    Balance     End of Session PT - End of Session Activity Tolerance: Patient tolerated treatment well Patient left: in bed;with call bell/phone within reach;with family/visitor present Nurse Communication: Mobility status   GP     Glenda Terry 04/21/2012, 5:34 PM

## 2012-04-21 NOTE — Progress Notes (Signed)
Utilization review completed.  

## 2012-04-22 NOTE — Progress Notes (Signed)
Physical Therapy Treatment Patient Details Name: Glenda Terry MRN: [id number] DOB: 06-14-1940 Today's Date: 04/22/2012 Time: 4098-1191 PT Time Calculation (min): 26 min  PT Assessment / Plan / Recommendation Comments on Treatment Session       Follow Up Recommendations  Home health PT     Does the patient have the potential to tolerate intense rehabilitation     Barriers to Discharge        Equipment Recommendations  None recommended by PT    Recommendations for Other Services OT consult  Frequency Min 6X/week   Plan Discharge plan remains appropriate    Precautions / Restrictions Precautions Precautions: Back Precaution Comments: pt recalls all precautions without cues Restrictions Weight Bearing Restrictions: No   Pertinent Vitals/Pain 3/10    Mobility  Bed Mobility Bed Mobility: Rolling Left;Rolling Right;Right Sidelying to Sit;Sit to Sidelying Right (In/out bed x 2) Rolling Right: 5: Supervision Rolling Left: 5: Supervision Right Sidelying to Sit: 4: Min guard Sit to Sidelying Right: 4: Min guard Details for Bed Mobility Assistance: cues for sequence and correct log roll technique Transfers Transfers: Sit to Stand;Stand to Sit Sit to Stand: 5: Supervision;4: Min guard;From bed;From chair/3-in-1;With upper extremity assist Stand to Sit: 5: Supervision;4: Min guard;To chair/3-in-1;To bed;With armrests;With upper extremity assist Details for Transfer Assistance: cues for LE position and use of UEs to self assist Ambulation/Gait Ambulation/Gait Assistance: 4: Min guard;5: Supervision Ambulation Distance (Feet): 200 Feet Assistive device: Rolling walker Ambulation/Gait Assistance Details: min cues for position from RW Gait Pattern: Step-through pattern Gait velocity: slow and steady Stairs: Yes Stairs Assistance: 4: Min assist Stairs Assistance Details (indicate cue type and reason): min cues for sequence, HHA  Stair Management Technique: One rail  Right;Forwards;Step to pattern Number of Stairs: 4    Exercises     PT Diagnosis:    PT Problem List:   PT Treatment Interventions:     PT Goals Acute Rehab PT Goals PT Goal Formulation: With patient Time For Goal Achievement: 04/24/12 Potential to Achieve Goals: Good Pt will go Supine/Side to Sit: with supervision PT Goal: Supine/Side to Sit - Progress: Progressing toward goal Pt will go Sit to Supine/Side: with supervision PT Goal: Sit to Supine/Side - Progress: Progressing toward goal Pt will go Sit to Stand: with supervision PT Goal: Sit to Stand - Progress: Progressing toward goal Pt will go Stand to Sit: with supervision PT Goal: Stand to Sit - Progress: Met Pt will Ambulate: >150 feet;with rolling walker;with supervision PT Goal: Ambulate - Progress: Progressing toward goal Pt will Go Up / Down Stairs: 6-9 stairs;with min assist;with least restrictive assistive device PT Goal: Up/Down Stairs - Progress: Progressing toward goal  Visit Information  Last PT Received On: 04/22/12 Assistance Needed: +1    Subjective Data  Subjective: I think I'm doing really good Patient Stated Goal: Resume previous lifestyle with decreased pain   Cognition  Cognition Overall Cognitive Status: Appears within functional limits for tasks assessed/performed Arousal/Alertness: Awake/alert Orientation Level: Appears intact for tasks assessed Behavior During Session: [facility] for tasks performed    Balance     End of Session PT - End of Session Equipment Utilized During Treatment: Gait belt Activity Tolerance: Patient tolerated treatment well Patient left: in chair;with call bell/phone within reach;with nursing in room Nurse Communication: Mobility status   GP     Christ Fullenwider 04/22/2012, 9:22 AM

## 2012-04-22 NOTE — Anesthesia Postprocedure Evaluation (Signed)
  Anesthesia Post-op Note  Patient: Glenda Terry  Procedure(s) Performed: Procedure(s) (LRB): Decompressive Lumbar Laminectomy of the L4 - L5 and L5 - S1 Complete/Laminectomy L5 on the Right (X-Ray) (Right)  Patient Location: PACU  Anesthesia Type: General  Level of Consciousness: awake and alert   Airway and Oxygen Therapy: Patient Spontanous Breathing  Post-op Pain: mild  Post-op Assessment: Post-op Vital signs reviewed, Patient's Cardiovascular Status Stable, Respiratory Function Stable, Patent Airway and No signs of Nausea or vomiting  Last Vitals:  Filed Vitals:   04/22/12 0540  BP: 145/78  Pulse: 85  Temp: 36.8 C  Resp: 16    Post-op Vital Signs: stable   Complications: No apparent anesthesia complications

## 2012-04-22 NOTE — Progress Notes (Signed)
[facility]

## 2012-04-22 NOTE — Progress Notes (Signed)
   Subjective: Hospital day - 2 Patient reports pain as mild.   Patient seen in rounds with Dr. Lequita Halt. Patient is well, and has had no acute complaints or problems Plan is to go Home after hospital stay.  Objective: Vital signs in last 24 hours: Temp:  [97.8 F (36.6 C)-98.3 F (36.8 C)] 98.3 F (36.8 C) (02/08 0540) Pulse Rate:  [65-92] 85 (02/08 0540) Resp:  [16] 16 (02/08 0540) BP: (126-145)/(72-78) 145/78 mmHg (02/08 0540) SpO2:  [96 %-99 %] 98 % (02/08 0540)  Intake/Output from previous day:  Intake/Output Summary (Last 24 hours) at 04/22/12 0811 Last data filed at 04/22/12 0324  Gross per 24 hour  Intake    770 ml  Output   1250 ml  Net   -480 ml    Intake/Output this shift:    Labs:  Recent Labs  04/21/12 0355 04/21/12 1505  HGB 8.3* 8.4*    Recent Labs  04/21/12 0355 04/21/12 1505  WBC 6.7  --   RBC 3.67*  --   HCT 27.1* 27.5*  PLT 289  --    No results found for this basename: NA, K, CL, CO2, BUN, CREATININE, GLUCOSE, CALCIUM,  in the last 72 hours No results found for this basename: LABPT, INR,  in the last 72 hours  EXAM General - Patient is Alert, Appropriate and Oriented Extremity - Neurovascular intact Sensation intact distally Dorsiflexion/Plantar flexion intact Dressing - dressing C/D/I Motor Function - intact, moving foot and toes well on exam.   Past Medical History  Diagnosis Date  . COPD (chronic obstructive pulmonary disease)   . Back pain, chronic   . Anxiety   . Hypertension   . GERD (gastroesophageal reflux disease)   . Shortness of breath     OCCASIONAL  . Arthritis   . Anemia NOV 2013  . Complication of anesthesia 1985    VITALS SIGNS DROPPED AND COULD NOT TAKE PAIN MEDS UNTIL VITALS IMPROVED    Assessment/Plan: Hospital day - 2 Active Problems:   Spinal stenosis of lumbar region without neurogenic claudication   Herniated lumbar intervertebral disc  Estimated body mass index is 35.74 kg/(m^2) as calculated  from the following:   Height as of this encounter: 5' (1.524 m).   Weight as of this encounter: 83.008 kg (183 lb). Up with therapy Discharge home Follow up in 2 weeks.  Glenda Terry 04/22/2012, 8:11 AM

## 2012-04-26 NOTE — Discharge Summary (Signed)
Physician Discharge Summary   Patient ID: Glenda Terry MRN: [id number] DOB/AGE: 1940/07/10 72 y.o.  Admit date: 04/20/2012 Discharge date: 04/22/2012  Primary Diagnosis:     Spinal stenosis of lumbar region without neurogenic claudication   Herniated lumbar intervertebral disc  Admission Diagnoses:  Past Medical History  Diagnosis Date  . COPD (chronic obstructive pulmonary disease)   . Back pain, chronic   . Anxiety   . Hypertension   . GERD (gastroesophageal reflux disease)   . Shortness of breath     OCCASIONAL  . Arthritis   . Anemia NOV 2013  . Complication of anesthesia 1985    VITALS SIGNS DROPPED AND COULD NOT TAKE PAIN MEDS UNTIL VITALS IMPROVED   Discharge Diagnoses:   Active Problems:   Spinal stenosis of lumbar region without neurogenic claudication   Herniated lumbar intervertebral disc  Estimated body mass index is 35.74 kg/(m^2) as calculated from the following:   Height as of this encounter: 5' (1.524 m).   Weight as of this encounter: 83.008 kg (183 lb).  Classification of overweight in adults according to BMI (WHO, 1998)   Procedure:  Procedure(s) (LRB): DECOMPRESSIVE LUMBAR LAMINECTOMY LEVEL 2 (Right)   Consults: None  HPI: The patient presented to Dr. Darrelyn Hillock with severe pain in her back. It now radiates into the right lower extremity. The pain started about two weeks ago with no known injury. Pain radiates all the way down to the bottom of the right foot. She recently had a right total knee arthroplasty done by Dr. Lequita Halt. MRI revealed a definite herniated disc at L4-5 on the right. 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     Laboratory Data: Admission on 04/20/2012, Discharged on 04/22/2012  Component Date Value Range Status  . ABO/RH(D) 04/20/2012 B POS   Final  . Antibody Screen 04/20/2012 NEG   Final  . Sample Expiration 04/20/2012 04/23/2012   Final  . WBC 04/21/2012 6.7  4.0 - 10.5 K/uL Final  . RBC 04/21/2012  3.67* 3.87 - 5.11 MIL/uL Final  . Hemoglobin 04/21/2012 8.3* 12.0 - 15.0 g/dL Final  . HCT 11/91/4782 27.1* 36.0 - 46.0 % Final  . MCV 04/21/2012 73.8* 78.0 - 100.0 fL Final  . MCH 04/21/2012 22.6* 26.0 - 34.0 pg Final  . MCHC 04/21/2012 30.6  30.0 - 36.0 g/dL Final  . RDW 95/62/1308 15.1  11.5 - 15.5 % Final  . Platelets 04/21/2012 289  150 - 400 K/uL Final  . Hemoglobin 04/21/2012 8.4* 12.0 - 15.0 g/dL Final  . HCT 65/78/4696 27.5* 36.0 - 46.0 % Final  Hospital Outpatient Visit on 04/11/2012  Component Date Value Range Status  . aPTT 04/11/2012 28  24 - 37 seconds Final  . Sodium 04/11/2012 135  135 - 145 mEq/L Final  . Potassium 04/11/2012 4.2  3.5 - 5.1 mEq/L Final  . Chloride 04/11/2012 102  96 - 112 mEq/L Final  . CO2 04/11/2012 26  19 - 32 mEq/L Final  . Glucose, Bld 04/11/2012 126* 70 - 99 mg/dL Final  . BUN 29/52/8413 15  6 - 23 mg/dL Final  . Creatinine, Ser 04/11/2012 0.93  0.50 - 1.10 mg/dL Final  . Calcium 24/40/1027 9.1  8.4 - 10.5 mg/dL Final  . Total Protein 04/11/2012 6.9  6.0 - 8.3 g/dL Final  . Albumin 25/36/6440 3.2* 3.5 - 5.2 g/dL Final  . AST 34/74/2595 23  0 - 37 U/L Final  . ALT 04/11/2012 19  0 -  35 U/L Final  . Alkaline Phosphatase 04/11/2012 94  39 - 117 U/L Final  . Total Bilirubin 04/11/2012 0.2* 0.3 - 1.2 mg/dL Final  . GFR calc non Af Amer 04/11/2012 61* >90 mL/min Final  . GFR calc Af Amer 04/11/2012 71* >90 mL/min Final   Comment:                                 The eGFR has been calculated                          using the CKD EPI equation.                          This calculation has not been                          validated in all clinical                          situations.                          eGFR's persistently                          <90 mL/min signify                          possible Chronic Kidney Disease.  Marland Kitchen Prothrombin Time 04/11/2012 12.5  11.6 - 15.2 seconds Final  . INR 04/11/2012 0.94  0.00 - 1.49 Final  . Color,  Urine 04/11/2012 YELLOW  YELLOW Final  . APPearance 04/11/2012 CLOUDY* CLEAR Final  . Specific Gravity, Urine 04/11/2012 1.031* 1.005 - 1.030 Final  . pH 04/11/2012 5.5  5.0 - 8.0 Final  . Glucose, UA 04/11/2012 NEGATIVE  NEGATIVE mg/dL Final  . Hgb urine dipstick 04/11/2012 NEGATIVE  NEGATIVE Final  . Bilirubin Urine 04/11/2012 NEGATIVE  NEGATIVE Final  . Ketones, ur 04/11/2012 NEGATIVE  NEGATIVE mg/dL Final  . Protein, ur 40/98/1191 NEGATIVE  NEGATIVE mg/dL Final  . Urobilinogen, UA 04/11/2012 1.0  0.0 - 1.0 mg/dL Final  . Nitrite 47/82/9562 NEGATIVE  NEGATIVE Final  . Leukocytes, UA 04/11/2012 NEGATIVE  NEGATIVE Final   MICROSCOPIC NOT DONE ON URINES WITH NEGATIVE PROTEIN, BLOOD, LEUKOCYTES, NITRITE, OR GLUCOSE <1000 mg/dL.  . WBC 04/11/2012 7.2  4.0 - 10.5 K/uL Final  . RBC 04/11/2012 4.42  3.87 - 5.11 MIL/uL Final  . Hemoglobin 04/11/2012 9.9* 12.0 - 15.0 g/dL Final  . HCT 13/10/6576 33.1* 36.0 - 46.0 % Final  . MCV 04/11/2012 74.9* 78.0 - 100.0 fL Final  . MCH 04/11/2012 22.4* 26.0 - 34.0 pg Final  . MCHC 04/11/2012 29.9* 30.0 - 36.0 g/dL Final  . RDW 46/96/2952 15.3  11.5 - 15.5 % Final  . Platelets 04/11/2012 283  150 - 400 K/uL Final  . MRSA, PCR 04/11/2012 NEGATIVE  NEGATIVE Final  . Staphylococcus aureus 04/11/2012 NEGATIVE  NEGATIVE Final   Comment:                                 The Xpert SA Assay (FDA  approved for NASAL specimens                          in patients over 53 years of age),                          is one component of                          a comprehensive surveillance                          program.  Test performance has                          been validated by [employer] for patients greater                          than or equal to 66 year old.                          It is not intended                          to diagnose infection nor to                          guide or monitor treatment.      X-Rays:Dg Lumbar Spine 2-3 Views  04/11/2012  *RADIOLOGY REPORT*  Clinical Data: Surgical reference.  Preop.  LUMBAR SPINE - 2-3 VIEW  Comparison: 10/15/2011 CT myelogram  Findings: Severe rightward scoliosis in the thoracolumbar spine. Degenerative disc and facet disease throughout the lumbar spine. No fracture or acute malalignment.  SI joints are symmetric and unremarkable.  IMPRESSION: Scoliosis and degenerative changes.  No acute findings.   Original Report Authenticated By: Charlett Nose, M.D.    Dg Spine Portable 1 View  04/20/2012  *RADIOLOGY REPORT*  Clinical Data: Intraoperative lumbar laminectomy  PORTABLE SPINE - 1 VIEW  Comparison: 04/20/2012 at 1618 hours  Findings: Lateral view of the lumbar spine demonstrates surgical probes posterior to the L4 and L5 vertebral bodies.  IMPRESSION: Intraoperative radiograph as above.   Original Report Authenticated By: Charline Bills, M.D.    Dg Spine Portable 1 View  04/20/2012  *RADIOLOGY REPORT*  Clinical Data: Intraoperative spinal localization  PORTABLE SPINE - 1 VIEW  Comparison: Intraoperative spinal localization radiograph - earlier same day; lumbar spine radiographs - 04/11/2012  Findings:  Lumbar spine labeling is in keeping with preoperative lumbar spine radiographs.  Surgical instruments are seen overlying the soft tissues posterior to the L3 - L4 and L4 - L5 intervertebral disc spaces.  IMPRESSION: Intraoperative spinal localization as above.   Original Report Authenticated By: Tacey Ruiz, MD    Dg Spine Portable 1 View  04/20/2012  *RADIOLOGY REPORT*  Clinical Data: Intraoperative examination (please label lumbar vertebral bodies)  PORTABLE SPINE - 1 VIEW  Comparison: Earlier same day; lumbar spine radiographs - 04/03/2012; lumbar spine CT - 10/15/2011  Findings:  Lumbar spine labeling is in keeping with preprocedural lumbar spine radiographs.  Surgical instruments are seen overlying the soft tissues posterior to the L4 and L5 spinous  processes.  IMPRESSION: Intraoperative spinal localization as above.   Original Report Authenticated By: Tacey Ruiz, MD    Dg Spine Portable 1 View  04/20/2012  *RADIOLOGY REPORT*  Clinical Data: Intraoperative localization for spine surgery.  PORTABLE SPINE - 1 VIEW  Comparison: 04/11/2012 radiographs.  Findings: There are spinal needles projecting between the L3 and L4 spinous processes and between the L4 and L5 spinous processes.  IMPRESSION: Intraoperative localization as above.   Original Report Authenticated By: Rudie Meyer, M.D.     EKG: Orders placed during the hospital encounter of 05/17/11  . ED EKG  . ED EKG  . EKG     Hospital Course: Glenda Terry is a 72 y.o. who was admitted to [facility]. They were brought to the operating room on 04/20/2012 and underwent Procedure(s): DECOMPRESSIVE LUMBAR LAMINECTOMY LEVEL 2.  Patient tolerated the procedure well and was later transferred to the recovery room and then to the orthopaedic floor for postoperative care.  They were given PO and IV analgesics for pain control following their surgery.  They were given 24 hours of postoperative antibiotics of  Anti-infectives   Start     Dose/Rate Route Frequency Ordered Stop   04/21/12 0600  ceFAZolin (ANCEF) IVPB 2 g/50 mL premix     2 g 100 mL/hr over 30 Minutes Intravenous On call to O.R. 04/20/12 1251 04/20/12 1507   04/20/12 2200  ceFAZolin (ANCEF) IVPB 1 g/50 mL premix     1 g 100 mL/hr over 30 Minutes Intravenous 3 times per day 04/20/12 1857 04/21/12 1359   04/20/12 1537  polymyxin B 500,000 Units, bacitracin 50,000 Units in sodium chloride irrigation 0.9 % 500 mL irrigation  Status:  Discontinued       As needed 04/20/12 1537 04/20/12 1753     and started on DVT prophylaxis in the form of Aspirin.   PT was ordered for gait training and assistance.  Discharge planning consulted to help with postop disposition and equipment needs.  Patient had a good night on the evening of  surgery and started to get up OOB with therapy on day one.  Continued to work with therapy into day two.  Dressing was changed on day two and the incision was clean and dry. Patient was seen in rounds and was ready to go home.   Discharge Medications: Prior to Admission medications   Medication Sig Start Date End Date Taking? Authorizing Provider  albuterol (PROAIR HFA) 108 (90 BASE) MCG/ACT inhaler Inhale 2 puffs into the lungs every 6 (six) hours as needed. For COPD   Yes Historical Provider, MD  ALPRAZolam Prudy Feeler) 0.5 MG tablet Take 0.5 mg by mouth every 8 (eight) hours as needed. For anxiety   Yes Historical Provider, MD  beclomethasone (QVAR) 80 MCG/ACT inhaler Inhale 2 puffs into the lungs 2 (two) times daily.    Yes Historical Provider, MD  diphenhydrAMINE (BENADRYL) 25 MG tablet Take 25 mg by mouth at bedtime as needed. For allergies   Yes Historical Provider, MD  esomeprazole (NEXIUM) 40 MG capsule Take 40 mg by mouth daily before breakfast.    Yes Historical Provider, MD  fluticasone (FLONASE) 50 MCG/ACT nasal spray Place 2 sprays into the nose every morning.    Yes Historical Provider, MD  loratadine (CLARITIN) 10 MG tablet Take 10 mg by mouth every morning.    Yes Historical Provider, MD  montelukast (SINGULAIR) 10 MG tablet Take 10 mg by mouth at bedtime.   Yes Historical Provider, MD  telmisartan (MICARDIS) 80 MG tablet Take 80 mg by mouth every morning.   Yes Historical Provider, MD  traMADol (ULTRAM) 50 MG tablet Take 50-100 mg by mouth every 6 (six) hours as needed. For pain 01/27/12  Yes [name] Julien Girt, PA  EPINEPHrine (EPIPEN 2-PAK) 0.3 mg/0.3 mL DEVI Inject 0.3 mg into the muscle once. As needed for tree nut allergy    Historical Provider, MD  methocarbamol (ROBAXIN) 500 MG tablet Take 1 tablet (500 mg total) by mouth every 6 (six) hours as needed. For muscle spasms 04/21/12   Kerby Nora, PA  oxyCODONE-acetaminophen (PERCOCET/ROXICET) 5-325 MG per tablet Take 1-2  tablets by mouth every 4 (four) hours as needed. 04/21/12   Quilla Freeze Tamala Ser, PA    Diet: Regular diet Activity:WBAT Follow-up:in 2 weeks Disposition - Home Discharged Condition: good   Discharge Orders   Future Orders Complete By Expires     Call MD / Call 911  As directed     Comments:      If you experience chest pain or shortness of breath, CALL 911 and be transported to the hospital emergency room.  If you develope a fever above 101 F, pus (white drainage) or increased drainage or redness at the wound, or calf pain, call your surgeon's office.    Constipation Prevention  As directed     Comments:      Drink plenty of fluids.  Prune juice may be helpful.  You may use a stool softener, such as Colace (over the counter) 100 mg twice a day.  Use MiraLax (over the counter) for constipation as needed.    Diet - low sodium heart healthy  As directed     Discharge instructions  As directed     Comments:      Change your dressing daily. Shower only, no tub bath. Call if any temperatures greater than 101 or any wound complications: [phone] during the day and ask for Dr. Jeannetta Ellis nurse, Mackey [facility]. Follow up in 2 weeks in office with Dr. Darrelyn Hillock    Driving restrictions  As directed     Comments:      No driving for 2 weeks    Increase activity slowly as tolerated  As directed     Lifting restrictions  As directed     Comments:      No lifting        Medication List    STOP taking these medications       oxyCODONE-acetaminophen 10-325 MG per tablet  Commonly known as:  PERCOCET      TAKE these medications       ALPRAZolam 0.5 MG tablet  Commonly known as:  XANAX  Take 0.5 mg by mouth every 8 (eight) hours as needed. For anxiety     beclomethasone 80 MCG/ACT inhaler  Commonly known as:  QVAR  Inhale 2 puffs into the lungs 2 (two) times daily.     diphenhydrAMINE 25 MG tablet  Commonly known as:  BENADRYL  Take 25 mg by mouth at bedtime as needed. For allergies       EPIPEN 2-PAK 0.3 mg/0.3 mL Devi  Generic drug:  EPINEPHrine  Inject 0.3 mg into the muscle once. As needed for tree nut allergy     esomeprazole 40 MG capsule  Commonly known as:  NEXIUM  Take 40 mg by mouth daily before breakfast.  fluticasone 50 MCG/ACT nasal spray  Commonly known as:  FLONASE  Place 2 sprays into the nose every morning.     loratadine 10 MG tablet  Commonly known as:  CLARITIN  Take 10 mg by mouth every morning.     methocarbamol 500 MG tablet  Commonly known as:  ROBAXIN  Take 1 tablet (500 mg total) by mouth every 6 (six) hours as needed. For muscle spasms     montelukast 10 MG tablet  Commonly known as:  SINGULAIR  Take 10 mg by mouth at bedtime.     oxyCODONE-acetaminophen 5-325 MG per tablet  Commonly known as:  PERCOCET/ROXICET  Take 1-2 tablets by mouth every 4 (four) hours as needed.     PROAIR HFA 108 (90 BASE) MCG/ACT inhaler  Generic drug:  albuterol  Inhale 2 puffs into the lungs every 6 (six) hours as needed. For COPD     telmisartan 80 MG tablet  Commonly known as:  MICARDIS  Take 80 mg by mouth every morning.     traMADol 50 MG tablet  Commonly known as:  ULTRAM  Take 50-100 mg by mouth every 6 (six) hours as needed. For pain           Follow-up Information   Follow up with GIOFFRE,RONALD A, MD. Schedule an appointment as soon as possible for a visit in 2 weeks.   Contact information:   [address], [address], [place] [address] [place] [address] [phone]       Signed: Kerby Nora 04/26/2012, 11:26 AM

## 2012-05-10 ENCOUNTER — Ambulatory Visit (HOSPITAL_COMMUNITY)
Admission: RE | Admit: 2012-05-10 | Discharge: 2012-05-10 | Disposition: A | Discharge status: Home / Self Care | Payer: Medicare Other | Source: Ambulatory Visit | Attending: Family Medicine | Admitting: Family Medicine

## 2012-05-10 DIAGNOSIS — R262 Difficulty in walking, not elsewhere classified: Secondary | ICD-10-CM | POA: Insufficient documentation

## 2012-05-10 DIAGNOSIS — I10 Essential (primary) hypertension: Secondary | ICD-10-CM | POA: Insufficient documentation

## 2012-05-10 DIAGNOSIS — IMO0001 Reserved for inherently not codable concepts without codable children: Secondary | ICD-10-CM | POA: Insufficient documentation

## 2012-05-10 DIAGNOSIS — M25669 Stiffness of unspecified knee, not elsewhere classified: Secondary | ICD-10-CM | POA: Insufficient documentation

## 2012-05-10 DIAGNOSIS — M6281 Muscle weakness (generalized): Secondary | ICD-10-CM | POA: Insufficient documentation

## 2012-05-10 DIAGNOSIS — M25569 Pain in unspecified knee: Secondary | ICD-10-CM | POA: Insufficient documentation

## 2012-05-10 NOTE — Evaluation (Signed)
Physical Therapy Evaluation  Patient Details  Name: Glenda Terry MRN: [id number] Date of Birth: 08-11-1940 Charge: eval Today's Date: 05/10/2012 Time: 1350-1430 PT Time Calculation (min): 40 min  Visit#: 1 of 9  Re-eval: 05/31/12 Assessment Diagnosis: Lumbar surgery Surgical Date: 04/20/12 Next MD Visit: 06/02/2012 Prior Therapy: N/A  Authorization: medicare    Authorization Visit#: 1 of 9   Past Medical History:  Past Medical History  Diagnosis Date  . COPD (chronic obstructive pulmonary disease)   . Back pain, chronic   . Anxiety   . Hypertension   . GERD (gastroesophageal reflux disease)   . Shortness of breath     OCCASIONAL  . Arthritis   . Anemia NOV 2013  . Complication of anesthesia 1985    VITALS SIGNS DROPPED AND COULD NOT TAKE PAIN MEDS UNTIL VITALS IMPROVED   Past Surgical History:  Past Surgical History  Procedure Laterality Date  . Hysterotomy    . Tubal ligation    . Abdominal hysterectomy    . Knee arthroscopy    . Total knee arthroplasty  01/24/2012    Procedure: TOTAL KNEE ARTHROPLASTY;  Surgeon: Loanne Drilling, MD;  Location: [facility];  Service: Orthopedics;  Laterality: Right;  . Decompressive lumbar laminectomy level 2  04/20/2012    Procedure: DECOMPRESSIVE LUMBAR LAMINECTOMY LEVEL 2;  Surgeon: Jacki Cones, MD;  Location: [facility];  Service: Orthopedics;  Laterality: Right;  Decompressive Lumbar Laminectomy of the L4 - L5 and L5 - S1 Complete/Laminectomy L5 on the Right (X-Ray)    Subjective Symptoms/Limitations Symptoms: [name] had back surgery on 04/20/2012.  She is currently walking with a cane.  She is taking pain medication twice a day.  She is not to bend forward or lift more than 10 pounds.  She is having difficulty standing for any length of time or walk.  She is being referred to therapy to improve her functional  ability. Pertinent History: Pt had a TKR in November of 2013 How long can you sit comfortably?: The pt is able  to sit as long as she wants. How long can you stand comfortably?: She is able to stand for 10-15 minutes. How long can you walk comfortably?: She is walking with a cane she is able to walk for  ten minutes.  Pain Assessment Currently in Pain?: Yes Pain Score:   5 Pain Location: Back Pain Orientation: Lower;Right Pain Type: Surgical pain Pain Radiating Towards: none  Precautions/Restrictions  Precautions Precautions: Back  Prior Functioning  Home Living Lives With: Family;Daughter Available Help at Discharge: Family Type of Home: House Entrance [place] of Steps: 6 Entrance Stairs-Rails: Right Home Layout: One level Bathroom Shower/Tub: Engineer, manufacturing systems: Handicapped height Home Adaptive Equipment: Reacher;Tub transfer bench;Bedside commode/3-in-1;Long-handled shoehorn  Cognition/Observation Observation/Other Assessments Observations: superior aspect of incision is not closed for approximately 1/2"  Sensation/Coordination/Flexibility/Functional Tests Functional Tests Functional Tests:  23/50  Assessment RLE Strength Right Hip Flexion: 5/5 Right Hip Extension: 3/5 Right Hip ABduction: 3+/5 Right Knee Flexion: 5/5 Right Knee Extension:  (5-/5) Right Ankle Dorsiflexion: 3/5 LLE Strength Left Hip Flexion: 5/5 Left Hip Extension: 4/5 Left Hip ABduction: 5/5 Left Knee Flexion: 5/5 Left Knee Extension: 5/5 Left Ankle Dorsiflexion: 5/5 Lumbar AROM Lumbar Flexion: NT Lumbar Extension: decreased 20% Lumbar - Right Side Bend: decreased 20% Lumbar - Left Side Bend:  (wnl)  Exercise/Treatments     Stretches Active Hamstring Stretch: 3 reps;30 seconds Single Knee to Chest Stretch: 3 reps;30 seconds   Seated Other  Seated Lumbar Exercises: ab set; hip roll in/out, kegal  x 10    Physical Therapy Assessment and Plan PT Assessment and Plan Clinical Impression Statement: Pt s/p back surgery on 04/20/2012 who will benefit from core strrengthening as  well as education in proper body mechanics to decrease pain and improve functional activity tolerance.   Pt will benefit from skilled therapeutic intervention in order to improve on the following deficits: Decreased range of motion;Decreased strength;Pain;Decreased activity tolerance PT Frequency: Min 3X/week PT Duration:  (3 weeks) PT Treatment/Interventions: Stair training;Therapeutic activities;Therapeutic exercise;Patient/family education;Modalities;Manual techniques PT Plan: Begin single leg raise, bridge, isometric hip flex, heelraise and minisquats.  3rd treatment begin SLR, clam, and SL AB; 4th begin t-band for posutre, 5th prone and 6th body mechanics for lifiting    Goals Home Exercise Program Pt will Perform Home Exercise Program: Independently PT Short Term Goals Time to Complete Short Term Goals: 2 weeks PT Short Term Goal 1: Pt to be walking 20 min PT Short Term Goal 2: Pt to be able to stand for 15 min PT Short Term Goal 3: Strength to be improved 1/2 grade to improve ease of stairclimbing PT Long Term Goals PT Long Term Goal 1: Pt to be able to walk without her cane PT Long Term Goal 2: Pt strength to be increased by one grade to allow reciprocal stair climbing Long Term Goal 3: Pt to be able to stand for 30 min to make a meal. Long Term Goal 4: Pt to be walking 30 minutes at a time. PT Long Term Goal 5: Pain to be no greater than a 2 80% of the day  Problem List Patient Active Problem List  Diagnosis  . ULCERATION OF INTESTINE  . Anxiety  . HTN (hypertension)  . Arthritis  . GERD (gastroesophageal reflux disease)  . OA (osteoarthritis) of knee  . Postop Hyponatremia  . Postop Acute blood loss anemia  . Difficulty in walking  . Stiffness of joint, not elsewhere classified, lower leg  . Weakness of right leg  . Spinal stenosis of lumbar region without neurogenic claudication  . Herniated lumbar intervertebral disc    PT - End of Session Activity Tolerance:  Patient tolerated treatment well General Behavior During Session: [facility] for tasks performed Cognition: [facility] for tasks performed PT Plan of Care PT Home Exercise Plan: given  GP Functional Assessment Tool Used: Oswestry Functional Limitation: Self care Mobility: Walking and Moving Around Current Status (A5409): At least 40 percent but less than 60 percent impaired, limited or restricted Mobility: Walking and Moving Around Goal Status [phone]): At least 1 percent but less than 20 percent impaired, limited or restricted  RUSSELL,CINDY 05/10/2012, 4:47 PM  Physician Documentation Your signature is required to indicate approval of the treatment plan as stated above.  Please sign and either send electronically or make a copy of this report for your files and return this physician signed original.   Please mark one 1.__approve of plan  2. ___approve of plan with the following conditions.   ______________________________                                                          _____________________ Physician Signature  Date  

## 2012-05-11 ENCOUNTER — Ambulatory Visit (HOSPITAL_COMMUNITY)
Admission: RE | Admit: 2012-05-11 | Discharge: 2012-05-11 | Disposition: A | Discharge status: Home / Self Care | Payer: Medicare Other | Source: Ambulatory Visit | Attending: Physical Therapy | Admitting: Physical Therapy

## 2012-05-11 NOTE — Progress Notes (Signed)
Physical Therapy Treatment Patient Details  Name: Glenda Terry MRN: [id number] Date of Birth: 11-Dec-1940  Today's Date: 05/11/2012 Time: 5621-3086 PT Time Calculation (min): 42 min Visit#: 2 of 9  Re-eval: 05/31/12 Charges:  therex 32', manual 8' Authorization: medicare  Authorization Visit#: 2 of 9   Subjective: Symptoms/Limitations Symptoms: Pt. states she just doesn't feel good today.  States her back hurts a little around her scar but overall doing much better since having surgery.  States her R shoulder still hurts her quite a bit. Pain Assessment Currently in Pain?: Yes Pain Score:   4 Pain Location: Back   Exercise/Treatments Stretches Active Hamstring Stretch: 3 reps;20 seconds Single Knee to Chest Stretch: 3 reps;20 seconds Aerobic Tread Mill: 5' @ 1.0 mph Standing Heel Raises: 10 reps;Limitations Heel Raises Limitations: toeraises 10 reps Seated Other Seated Lumbar Exercises: ab set; hip roll in/out, kegal  x 10 Supine Bridge: 10 reps Straight Leg Raise: 10 reps Isometric Hip Flexion: 5 seconds;5 reps   Manual Therapy Manual Therapy: Other (comment) Other Manual Therapy: scar massage to lumbar incision in side lying position X 8'  Physical Therapy Assessment and Plan PT Assessment and Plan Clinical Impression Statement: Began gait on TM today with VC's to increase stride length and heel/toe gait.  Completed new mat activities without difficulty or complaints.  Pt. able to recall lumbar stretches independently.  Added scar tissue to lumbar incision due to adhesions with noted improvement.  Pt. with overall improved ambulation and feeling much better at end of session. PT Duration:  (3 weeks) PT Plan: Next treatment begin clam and SL AB; 4th begin t-band for posutre, 5th prone and 6th body mechanics for lifiting.     Problem List Patient Active Problem List  Diagnosis  . ULCERATION OF INTESTINE  . Anxiety  . HTN (hypertension)  . Arthritis  . GERD  (gastroesophageal reflux disease)  . OA (osteoarthritis) of knee  . Postop Hyponatremia  . Postop Acute blood loss anemia  . Difficulty in walking  . Stiffness of joint, not elsewhere classified, lower leg  . Weakness of right leg  . Spinal stenosis of lumbar region without neurogenic claudication  . Herniated lumbar intervertebral disc    PT - End of Session Activity Tolerance: Patient tolerated treatment well General Behavior During Session: [facility] for tasks performed Cognition: [facility] for tasks performed PT Plan of Care PT Home Exercise Plan: given   Lurena Nida, PTA/CLT 05/11/2012, 5:41 PM

## 2012-05-14 ENCOUNTER — Encounter (HOSPITAL_COMMUNITY): Payer: Self-pay | Admitting: Emergency Medicine

## 2012-05-14 ENCOUNTER — Emergency Department (HOSPITAL_COMMUNITY): Payer: Medicare Other

## 2012-05-14 ENCOUNTER — Emergency Department (HOSPITAL_COMMUNITY)
Admission: EM | Admit: 2012-05-14 | Discharge: 2012-05-14 | Disposition: A | Discharge status: Home / Self Care | Payer: Medicare Other | Attending: Emergency Medicine | Admitting: Emergency Medicine

## 2012-05-14 DIAGNOSIS — Z8739 Personal history of other diseases of the musculoskeletal system and connective tissue: Secondary | ICD-10-CM | POA: Insufficient documentation

## 2012-05-14 DIAGNOSIS — I10 Essential (primary) hypertension: Secondary | ICD-10-CM | POA: Insufficient documentation

## 2012-05-14 DIAGNOSIS — G8929 Other chronic pain: Secondary | ICD-10-CM | POA: Insufficient documentation

## 2012-05-14 DIAGNOSIS — Z87891 Personal history of nicotine dependence: Secondary | ICD-10-CM | POA: Insufficient documentation

## 2012-05-14 DIAGNOSIS — J4489 Other specified chronic obstructive pulmonary disease: Secondary | ICD-10-CM | POA: Insufficient documentation

## 2012-05-14 DIAGNOSIS — Z862 Personal history of diseases of the blood and blood-forming organs and certain disorders involving the immune mechanism: Secondary | ICD-10-CM | POA: Insufficient documentation

## 2012-05-14 DIAGNOSIS — Z9889 Other specified postprocedural states: Secondary | ICD-10-CM | POA: Insufficient documentation

## 2012-05-14 DIAGNOSIS — Z79899 Other long term (current) drug therapy: Secondary | ICD-10-CM | POA: Insufficient documentation

## 2012-05-14 DIAGNOSIS — R1031 Right lower quadrant pain: Secondary | ICD-10-CM | POA: Insufficient documentation

## 2012-05-14 DIAGNOSIS — K219 Gastro-esophageal reflux disease without esophagitis: Secondary | ICD-10-CM | POA: Insufficient documentation

## 2012-05-14 DIAGNOSIS — R112 Nausea with vomiting, unspecified: Secondary | ICD-10-CM | POA: Insufficient documentation

## 2012-05-14 DIAGNOSIS — Z9851 Tubal ligation status: Secondary | ICD-10-CM | POA: Insufficient documentation

## 2012-05-14 DIAGNOSIS — F411 Generalized anxiety disorder: Secondary | ICD-10-CM | POA: Insufficient documentation

## 2012-05-14 DIAGNOSIS — R6883 Chills (without fever): Secondary | ICD-10-CM | POA: Insufficient documentation

## 2012-05-14 DIAGNOSIS — Z90710 Acquired absence of both cervix and uterus: Secondary | ICD-10-CM | POA: Insufficient documentation

## 2012-05-14 DIAGNOSIS — IMO0002 Reserved for concepts with insufficient information to code with codable children: Secondary | ICD-10-CM | POA: Insufficient documentation

## 2012-05-14 DIAGNOSIS — N390 Urinary tract infection, site not specified: Secondary | ICD-10-CM | POA: Insufficient documentation

## 2012-05-14 LAB — URINALYSIS, ROUTINE W REFLEX MICROSCOPIC
Bilirubin Urine: NEGATIVE
Ketones, ur: NEGATIVE mg/dL
Nitrite: NEGATIVE
Protein, ur: NEGATIVE mg/dL
Urobilinogen, UA: 0.2 mg/dL (ref 0.0–1.0)

## 2012-05-14 LAB — CBC WITH DIFFERENTIAL/PLATELET
Basophils Absolute: 0 10*3/uL (ref 0.0–0.1)
Basophils Relative: 0 % (ref 0–1)
Eosinophils Absolute: 0.3 10*3/uL (ref 0.0–0.7)
Eosinophils Relative: 4 % (ref 0–5)
MCH: 22 pg — ABNORMAL LOW (ref 26.0–34.0)
MCV: 72.7 fL — ABNORMAL LOW (ref 78.0–100.0)
Platelets: 385 10*3/uL (ref 150–400)
RDW: 15.8 % — ABNORMAL HIGH (ref 11.5–15.5)
WBC: 7.2 10*3/uL (ref 4.0–10.5)

## 2012-05-14 LAB — COMPREHENSIVE METABOLIC PANEL
AST: 17 U/L (ref 0–37)
Albumin: 3.7 g/dL (ref 3.5–5.2)
Alkaline Phosphatase: 99 U/L (ref 39–117)
BUN: 12 mg/dL (ref 6–23)
CO2: 24 mEq/L (ref 19–32)
Chloride: 98 mEq/L (ref 96–112)
GFR calc non Af Amer: 56 mL/min — ABNORMAL LOW (ref >90)
Potassium: 3.5 mEq/L (ref 3.5–5.1)
Total Bilirubin: 0.4 mg/dL (ref 0.3–1.2)

## 2012-05-14 LAB — LIPASE, BLOOD: Lipase: 20 U/L (ref 11–59)

## 2012-05-14 LAB — URINE MICROSCOPIC-ADD ON

## 2012-05-14 MED ORDER — HYDROMORPHONE HCL PF 1 MG/ML IJ SOLN
1.0000 mg | Freq: Once | INTRAMUSCULAR | Status: AC
  Administered 2012-05-14: 1 mg via INTRAVENOUS
  Filled 2012-05-14: qty 1

## 2012-05-14 MED ORDER — SODIUM CHLORIDE 0.9 % IV BOLUS (SEPSIS)
250.0000 mL | Freq: Once | INTRAVENOUS | Status: AC
  Administered 2012-05-14: 250 mL via INTRAVENOUS

## 2012-05-14 MED ORDER — SODIUM CHLORIDE 0.9 % IV SOLN: INTRAVENOUS | Status: DC

## 2012-05-14 MED ORDER — ONDANSETRON HCL 4 MG/2ML IJ SOLN
4.0000 mg | Freq: Once | INTRAMUSCULAR | Status: AC
  Administered 2012-05-14: 4 mg via INTRAVENOUS
  Filled 2012-05-14: qty 2

## 2012-05-14 MED ORDER — PANTOPRAZOLE SODIUM 40 MG IV SOLR
40.0000 mg | Freq: Once | INTRAVENOUS | Status: AC
  Administered 2012-05-14: 40 mg via INTRAVENOUS
  Filled 2012-05-14: qty 40

## 2012-05-14 MED ORDER — DEXTROSE 5 % IV SOLN
1.0000 g | Freq: Once | INTRAVENOUS | Status: AC
  Administered 2012-05-14: 1 g via INTRAVENOUS
  Filled 2012-05-14: qty 10

## 2012-05-14 MED ORDER — CEPHALEXIN 500 MG PO CAPS: 500.0000 mg | ORAL_CAPSULE | Freq: Three times a day (TID) | ORAL | Status: DC

## 2012-05-14 MED ORDER — IOHEXOL 300 MG/ML  SOLN
50.0000 mL | Freq: Once | INTRAMUSCULAR | Status: AC | PRN
  Administered 2012-05-14: 50 mL via ORAL

## 2012-05-14 MED ORDER — IOHEXOL 300 MG/ML  SOLN
100.0000 mL | Freq: Once | INTRAMUSCULAR | Status: AC | PRN
  Administered 2012-05-14: 100 mL via INTRAVENOUS

## 2012-05-14 NOTE — ED Notes (Signed)
Pt reporting mild nausea after drinking CT contrast.  Denies abdominal pain at this time.  VS stable.  No distress noted at this time.  Pt denies needs.

## 2012-05-14 NOTE — ED Provider Notes (Signed)
History  This chart was scribed for Glenda Jakes, MD by Bennett Scrape, ED Scribe. This patient was seen in room APA08/APA08 and the patient's care was started at 5:40 PM.  CSN: [id number]  Arrival date & time 05/14/12  1730   First MD Initiated Contact with Patient 05/14/12 1740      Chief Complaint  Patient presents with  . Abdominal Pain     Patient is a 72 y.o. female presenting with abdominal pain. The history is provided by the patient. No language interpreter was used.  Abdominal Pain Pain location:  Suprapubic Pain quality: burning   Pain radiates to:  Epigastric region, LUQ, RUQ, LLQ, RLQ and periumbilical region Onset quality:  Gradual Duration:  1 day Timing:  Constant Progression:  Worsening Chronicity:  New Associated symptoms: chills, nausea and vomiting   Associated symptoms: no chest pain, no cough, no diarrhea, no dysuria, no fever, no shortness of breath and no sore throat     Glenda Terry is a 72 y.o. female who presents to the Emergency Department complaining of gradual onset, gradually worsening, constant suprapubic abdominal pain described as burning that radiates to all areas with associated chills, nausea and 6 episodes emesis that started yesterday morning. She reports one episode of emesis today. She rates her pain a 7 out of 10 currently. She denies having prior episodes of similar symptoms and she denies having any sick contacts with similar symptoms. She has a h/o COPD, anxiety and HTN and is a former smoker but denies alcohol use.  PCP is Dr. Margo Aye  Past Medical History  Diagnosis Date  . COPD (chronic obstructive pulmonary disease)   . Back pain, chronic   . Anxiety   . Hypertension   . GERD (gastroesophageal reflux disease)   . Shortness of breath     OCCASIONAL  . Arthritis   . Anemia NOV 2013  . Complication of anesthesia 1985    VITALS SIGNS DROPPED AND COULD NOT TAKE PAIN MEDS UNTIL VITALS IMPROVED    Past Surgical  History  Procedure Laterality Date  . Hysterotomy    . Tubal ligation    . Abdominal hysterectomy    . Knee arthroscopy    . Total knee arthroplasty  01/24/2012    Procedure: TOTAL KNEE ARTHROPLASTY;  Surgeon: Loanne Drilling, MD;  Location: [facility];  Service: Orthopedics;  Laterality: Right;  . Decompressive lumbar laminectomy level 2  04/20/2012    Procedure: DECOMPRESSIVE LUMBAR LAMINECTOMY LEVEL 2;  Surgeon: Jacki Cones, MD;  Location: [facility];  Service: Orthopedics;  Laterality: Right;  Decompressive Lumbar Laminectomy of the L4 - L5 and L5 - S1 Complete/Laminectomy L5 on the Right (X-Ray)    Family History  Problem Relation Age of Onset  . Breast cancer Sister     History  Substance Use Topics  . Smoking status: Former Smoker -- 2.00 packs/day for 40 years    Quit date: 01/19/1992  . Smokeless tobacco: Never Used  . Alcohol Use: No    No OB history provided.  Review of Systems  Constitutional: Positive for chills. Negative for fever.  HENT: Negative for congestion, sore throat, rhinorrhea and neck pain.   Eyes: Negative for visual disturbance.  Respiratory: Negative for cough and shortness of breath.   Cardiovascular: Negative for chest pain and leg swelling.  Gastrointestinal: Positive for nausea, vomiting and abdominal pain. Negative for diarrhea.  Genitourinary: Negative for dysuria.  Musculoskeletal: Negative for back pain.  Skin: Negative for  rash.  Neurological: Negative for headaches.  Hematological: Does not bruise/bleed easily.    Allergies  Flexeril; Other; and Statins  Home Medications   Current Outpatient Rx  Name  Route  Sig  Dispense  Refill  . albuterol (PROAIR HFA) 108 (90 BASE) MCG/ACT inhaler   Inhalation   Inhale 2 puffs into the lungs every 6 (six) hours as needed. For COPD         . ALPRAZolam (XANAX) 0.5 MG tablet   Oral   Take 0.5 mg by mouth every 8 (eight) hours as needed. For anxiety         . beclomethasone (QVAR) 80  MCG/ACT inhaler   Inhalation   Inhale 2 puffs into the lungs 2 (two) times daily.          Marland Kitchen esomeprazole (NEXIUM) 40 MG capsule   Oral   Take 40 mg by mouth daily before breakfast.          . loratadine (CLARITIN) 10 MG tablet   Oral   Take 10 mg by mouth every morning.          . montelukast (SINGULAIR) 10 MG tablet   Oral   Take 10 mg by mouth at bedtime.         Marland Kitchen telmisartan (MICARDIS) 80 MG tablet   Oral   Take 80 mg by mouth every morning.         . cephALEXin (KEFLEX) 500 MG capsule   Oral   Take 1 capsule (500 mg total) by mouth 3 (three) times daily.   28 capsule   0   . diphenhydrAMINE (BENADRYL) 25 MG tablet   Oral   Take 25 mg by mouth at bedtime as needed. For allergies         . EPINEPHrine (EPIPEN 2-PAK) 0.3 mg/0.3 mL DEVI   Intramuscular   Inject 0.3 mg into the muscle once. As needed for tree nut allergy         . fluticasone (FLONASE) 50 MCG/ACT nasal spray   Nasal   Place 2 sprays into the nose every morning.          Marland Kitchen oxyCODONE-acetaminophen (PERCOCET/ROXICET) 5-325 MG per tablet   Oral   Take 1-2 tablets by mouth every 4 (four) hours as needed.   60 tablet   0     Triage Vitals: BP 154/93  Pulse 104  Temp(Src) 98.5 F (36.9 C)  Resp 22  Ht 5' (1.524 m)  Wt 180 lb (81.647 kg)  BMI 35.15 kg/m2  SpO2 98%  Physical Exam  Nursing note and vitals reviewed. Constitutional: She is oriented to person, place, and time. She appears well-developed and well-nourished. No distress.  HENT:  Head: Normocephalic and atraumatic.  Mouth/Throat: Oropharynx is clear and moist.  Eyes: Conjunctivae and EOM are normal. Pupils are equal, round, and reactive to light.  Neck: Normal range of motion. Neck supple. No tracheal deviation present.  Cardiovascular: Normal rate and regular rhythm.   No murmur heard. Pulmonary/Chest: Effort normal and breath sounds normal. No respiratory distress. She has no wheezes. She has no rales.  Abdominal:  Soft. There is tenderness (RLQ tenderness). There is no rebound and no guarding.  Decreased bowel sounds  Musculoskeletal: Normal range of motion. She exhibits no edema.  Neurological: She is alert and oriented to person, place, and time. No cranial nerve deficit.  Skin: Skin is warm and dry.  Psychiatric: She has a normal mood and affect. Her behavior is  normal.    ED Course  Procedures (including critical care time)  DIAGNOSTIC STUDIES: Oxygen Saturation is 98% on room air, normal by my interpretation.    COORDINATION OF CARE: 6:01 PM-Discussed treatment plan which includes Ct of abdomen, CBC panel, UA and medications with pt at bedside and pt agreed to plan.   6:15 PM- Ordered 40 mg protonix injection, 1 mg Dilaudid injection, 4 mg Zofran injection and 250 mL of bolus  9:25 PM- Informed pt of lab work results and plan to order IV antibiotics. Pt agreed.  9:30 PM- Ordered rocephin 50 mL IVPB  Labs Reviewed  COMPREHENSIVE METABOLIC PANEL - Abnormal; Notable for the following:    Sodium 134 (*)    GFR calc non Af Amer 56 (*)    GFR calc Af Amer 65 (*)    All other components within normal limits  CBC WITH DIFFERENTIAL - Abnormal; Notable for the following:    Hemoglobin 9.4 (*)    HCT 31.1 (*)    MCV 72.7 (*)    MCH 22.0 (*)    RDW 15.8 (*)    Neutrophils Relative 41 (*)    All other components within normal limits  URINALYSIS, ROUTINE W REFLEX MICROSCOPIC - Abnormal; Notable for the following:    Hgb urine dipstick TRACE (*)    All other components within normal limits  URINE MICROSCOPIC-ADD ON - Abnormal; Notable for the following:    Squamous Epithelial / LPF FEW (*)    Bacteria, UA FEW (*)    All other components within normal limits  URINE CULTURE  LIPASE, BLOOD   Ct Abdomen Pelvis W Contrast  05/14/2012  *RADIOLOGY REPORT*  Clinical Data: Abdominal pain, she will  CT ABDOMEN AND PELVIS WITH CONTRAST  Technique:  Multidetector CT imaging of the abdomen and pelvis  was performed following the standard protocol during bolus administration of intravenous contrast.  Contrast:  Insert contra100 ml Omnipaque 300  Comparison: CT lumbar spine 10/22/2011, CT 05/17/2011  Findings: Lung bases are clear.  No pericardial fluid.  Low density lesion in the posterior right hepatic lobe measures 16 mm which is unchanged from comparison exam.  The gallbladder, pancreas, spleen, adrenal glands, kidneys are normal.  The stomach, small bowel, appendix, and cecum are normal.  The colon and rectosigmoid colon are normal.  Abdominal aorta is normal caliber.  No retroperitoneal periportal lymphadenopathy.  No free fluid the pelvis.  The bladder is normal.  Post hysterectomy anatomy.  No pelvic lymphadenopathy. Review of  bone windows demonstrates no aggressive osseous lesions.  There is postsurgical change consistent with recent laminectomy at L5.  No complicating features.  IMPRESSION:  1.  No acute findings in the abdomen pelvis. 2.  Normal appendix. 3.  Postsurgical change at the lumbar spine without complicating features.   Original Report Authenticated By: Genevive Bi, M.D.       Date: 05/14/2012  Rate: 73  Rhythm: normal sinus rhythm  QRS Axis: normal  Intervals: normal  ST/T Wave abnormalities: normal  Conduction Disutrbances:none  Narrative Interpretation:   Old EKG Reviewed: unchanged  Results for orders placed during the hospital encounter of 05/14/12  COMPREHENSIVE METABOLIC PANEL      Result Value Range   Sodium 134 (*) 135 - 145 mEq/L   Potassium 3.5  3.5 - 5.1 mEq/L   Chloride 98  96 - 112 mEq/L   CO2 24  19 - 32 mEq/L   Glucose, Bld 97  70 - 99 mg/dL  BUN 12  6 - 23 mg/dL   Creatinine, Ser 4.54  0.50 - 1.10 mg/dL   Calcium 9.8  8.4 - 09.8 mg/dL   Total Protein 7.6  6.0 - 8.3 g/dL   Albumin 3.7  3.5 - 5.2 g/dL   AST 17  0 - 37 U/L   ALT 11  0 - 35 U/L   Alkaline Phosphatase 99  39 - 117 U/L   Total Bilirubin 0.4  0.3 - 1.2 mg/dL   GFR calc non Af Amer  56 (*) >90 mL/min   GFR calc Af Amer 65 (*) >90 mL/min  CBC WITH DIFFERENTIAL      Result Value Range   WBC 7.2  4.0 - 10.5 K/uL   RBC 4.28  3.87 - 5.11 MIL/uL   Hemoglobin 9.4 (*) 12.0 - 15.0 g/dL   HCT 11.9 (*) 14.7 - 82.9 %   MCV 72.7 (*) 78.0 - 100.0 fL   MCH 22.0 (*) 26.0 - 34.0 pg   MCHC 30.2  30.0 - 36.0 g/dL   RDW 56.2 (*) 13.0 - 86.5 %   Platelets 385  150 - 400 K/uL   Neutrophils Relative 41 (*) 43 - 77 %   Neutro Abs 3.0  1.7 - 7.7 K/uL   Lymphocytes Relative 46  12 - 46 %   Lymphs Abs 3.4  0.7 - 4.0 K/uL   Monocytes Relative 8  3 - 12 %   Monocytes Absolute 0.6  0.1 - 1.0 K/uL   Eosinophils Relative 4  0 - 5 %   Eosinophils Absolute 0.3  0.0 - 0.7 K/uL   Basophils Relative 0  0 - 1 %   Basophils Absolute 0.0  0.0 - 0.1 K/uL  LIPASE, BLOOD      Result Value Range   Lipase 20  11 - 59 U/L  URINALYSIS, ROUTINE W REFLEX MICROSCOPIC      Result Value Range   Color, Urine YELLOW  YELLOW   APPearance CLEAR  CLEAR   Specific Gravity, Urine 1.025  1.005 - 1.030   pH 6.0  5.0 - 8.0   Glucose, UA NEGATIVE  NEGATIVE mg/dL   Hgb urine dipstick TRACE (*) NEGATIVE   Bilirubin Urine NEGATIVE  NEGATIVE   Ketones, ur NEGATIVE  NEGATIVE mg/dL   Protein, ur NEGATIVE  NEGATIVE mg/dL   Urobilinogen, UA 0.2  0.0 - 1.0 mg/dL   Nitrite NEGATIVE  NEGATIVE   Leukocytes, UA NEGATIVE  NEGATIVE  URINE MICROSCOPIC-ADD ON      Result Value Range   Squamous Epithelial / LPF FEW (*) RARE   WBC, UA 3-6  <3 WBC/hpf   RBC / HPF 3-6  <3 RBC/hpf   Bacteria, UA FEW (*) RARE     1. Abdominal pain   2. Urinary tract infection       MDM   Questionable UTI will treat. CT Abd no significant findings to explain symptoms. Only possibility seems to be UTI.     I personally performed the services described in this documentation, which was scribed in my presence. The recorded information has been reviewed and is accurate.     Glenda Jakes, MD 05/16/12 1228

## 2012-05-14 NOTE — ED Notes (Signed)
Pt c/o lower abd pain/burning and chills since yesterday. Denies n/v/d/constipation. lnbm this am.

## 2012-05-14 NOTE — ED Notes (Signed)
Patient transported to CT 

## 2012-05-16 LAB — URINE CULTURE: Culture: NO GROWTH

## 2012-05-17 ENCOUNTER — Ambulatory Visit (HOSPITAL_COMMUNITY)
Admission: RE | Admit: 2012-05-17 | Discharge: 2012-05-17 | Disposition: A | Discharge status: Home / Self Care | Payer: Medicare Other | Source: Ambulatory Visit | Attending: Family Medicine | Admitting: Family Medicine

## 2012-05-17 DIAGNOSIS — I10 Essential (primary) hypertension: Secondary | ICD-10-CM | POA: Insufficient documentation

## 2012-05-17 DIAGNOSIS — IMO0001 Reserved for inherently not codable concepts without codable children: Secondary | ICD-10-CM | POA: Insufficient documentation

## 2012-05-17 DIAGNOSIS — M6281 Muscle weakness (generalized): Secondary | ICD-10-CM | POA: Insufficient documentation

## 2012-05-17 DIAGNOSIS — M25569 Pain in unspecified knee: Secondary | ICD-10-CM | POA: Insufficient documentation

## 2012-05-17 DIAGNOSIS — R262 Difficulty in walking, not elsewhere classified: Secondary | ICD-10-CM | POA: Insufficient documentation

## 2012-05-17 DIAGNOSIS — M25669 Stiffness of unspecified knee, not elsewhere classified: Secondary | ICD-10-CM | POA: Insufficient documentation

## 2012-05-18 NOTE — Progress Notes (Signed)
Physical Therapy Treatment Patient Details  Name: Glenda Terry MRN: [id number] Date of Birth: 1940/07/17  Today's Date: 05/18/2012 Time: 1478-2956 PT Time Calculation (min): 45 min Charge therex 36', manual 8'  Visit#: 3 of 9  Re-eval: 05/31/12 Assessment Diagnosis: Lumbar surgery Surgical Date: 04/20/12 Next MD Visit: 06/02/2012 Prior Therapy: N/A  Authorization: medicare  Authorization Time Period:    Authorization Visit#: 3 of 9   Subjective: Symptoms/Limitations Symptoms: Pt reported she had ER on Sunday for UTI.  LBP 5-6/10 Pain Assessment Currently in Pain?: Yes Pain Score:   6 Pain Location: Back Pain Orientation: Lower  Precautions/Restrictions  Precautions Precautions: Back  Exercise/Treatments Aerobic Tread Mill: 10' @ 1.2---> 1.7 Seated Other Seated Lumbar Exercises: ab set; hip roll out 10x 10", kegal  x 10 Supine Bridge: 10 reps Straight Leg Raise: 10 reps Sidelying Clam: 5 reps Hip Abduction: 10 reps Other Sidelying Lumbar Exercises:      Manual Therapy Manual Therapy: Other (comment) Other Manual Therapy: scar massage to lumbar incision in SL position x 8'  Physical Therapy Assessment and Plan PT Assessment and Plan Clinical Impression Statement: Increased time on TM today to improve activity tolerance and gait mechanics with min vc-ing to increase stride length and heel to toe gait.  Added SL clam and abduction for glut med strengthening.  Continued with scar tissue massage to lumbar insicion to reduce adhesions and pain.  Pt reported pain free at end of session. PT Plan: Next treatment begin t-band for posutre, 5th prone and 6th body mechanics for lifiting.    Goals    Problem List Patient Active Problem List  Diagnosis  . ULCERATION OF INTESTINE  . Anxiety  . HTN (hypertension)  . Arthritis  . GERD (gastroesophageal reflux disease)  . OA (osteoarthritis) of knee  . Postop Hyponatremia  . Postop Acute blood loss anemia  .  Difficulty in walking  . Stiffness of joint, not elsewhere classified, lower leg  . Weakness of right leg  . Spinal stenosis of lumbar region without neurogenic claudication  . Herniated lumbar intervertebral disc    PT - End of Session Activity Tolerance: Patient tolerated treatment well General Behavior During Session: [facility] for tasks performed Cognition: [facility] for tasks performed  GP    [name] 05/18/2012, 7:43 AM

## 2012-05-19 ENCOUNTER — Inpatient Hospital Stay (HOSPITAL_COMMUNITY): Admission: RE | Admit: 2012-05-19 | Payer: Medicare Other | Source: Ambulatory Visit

## 2012-05-22 ENCOUNTER — Ambulatory Visit (HOSPITAL_COMMUNITY)
Admission: RE | Admit: 2012-05-22 | Discharge: 2012-05-22 | Disposition: A | Discharge status: Home / Self Care | Payer: Medicare Other | Source: Ambulatory Visit | Attending: Orthopedic Surgery | Admitting: Orthopedic Surgery

## 2012-05-22 NOTE — Progress Notes (Signed)
out Physical Therapy Treatment Patient Details  Name: Glenda Terry MRN: [id number] Date of Birth: 1941-02-27  Today's Date: 05/22/2012 Time: 4098-1191 PT Time Calculation (min): 43 min  Visit#: 4 of 9  Re-eval: 05/31/12   Charge: [place] x 8', massage x 5; ex x 28 Authorization: medicare   Authorization Visit#: 4 of 9   Subjective: Symptoms/Limitations Symptoms: Pt states she had company all weekend and now has increased ack pain Pain Assessment Pain Score:   7 Pain Location: Back Pain Orientation: Lower Pain Type: Surgical pain   Exercise/Treatments Stretches Single Knee to Chest Stretch: 3 reps;30 seconds Standing Heel Raises: 10 reps Functional Squats: 10 reps  Supine Bridge: 15 reps Straight Leg Raise: 10 reps Isometric Hip Flexion: 10 reps Sidelying Clam: 10 reps Hip Abduction: 10 reps Prone  Straight Leg Raise: 10 reps Other Prone Lumbar Exercises: shld ext x 10    Modalities Modalities: Ultrasound Manual Therapy Manual Therapy: Massage Massage: scar massage.  Ultrasound Ultrasound Location: B paraspinal mm Ultrasound Parameters: 1.5 w/cm2 x 8 min Ultrasound Goals: Pain  Physical Therapy Assessment and Plan PT Assessment and Plan Clinical Impression Statement: began prone ex for glut strengthening today; scar tissue massage to lumbar insision Pt will benefit from skilled therapeutic intervention in order to improve on the following deficits: Decreased range of motion;Decreased strength;Pain;Decreased activity tolerance PT Plan: Next treatment begin t-band for posutre, 6th body mechanics for lifiting.    Goals Home Exercise Program Pt will Perform Home Exercise Program: Independently PT Goal: Perform Home Exercise Program - Progress: Met PT Short Term Goals PT Short Term Goal 1: Pt to be walking 20 min PT Short Term Goal 1 - Progress: Progressing toward goal PT Short Term Goal 2: Pt to be able to stand for 15 min PT Short Term Goal 2 -  Progress: Progressing toward goal PT Short Term Goal 3: Strength to be improved 1/2 grade to improve ease of stairclimbing PT Short Term Goal 3 - Progress: Progressing toward goal PT Long Term Goals PT Long Term Goal 1: Pt to be able to walk without her cane PT Long Term Goal 1 - Progress: Met PT Long Term Goal 2: Pt strength to be increased by one grade to allow reciprocal stair climbing PT Long Term Goal 2 - Progress: Progressing toward goal Long Term Goal 3: Pt to be able to stand for 30 min to make a meal. Long Term Goal 3 Progress: Progressing toward goal Long Term Goal 4: Pt to be walking 30 minutes at a time. Long Term Goal 4 Progress: Progressing toward goal PT Long Term Goal 5: Pain to be no greater than a 2 80% of the day Long Term Goal 5 Progress: Not met  Problem List Patient Active Problem List  Diagnosis  . ULCERATION OF INTESTINE  . Anxiety  . HTN (hypertension)  . Arthritis  . GERD (gastroesophageal reflux disease)  . OA (osteoarthritis) of knee  . Postop Hyponatremia  . Postop Acute blood loss anemia  . Difficulty in walking  . Stiffness of joint, not elsewhere classified, lower leg  . Weakness of right leg  . Spinal stenosis of lumbar region without neurogenic claudication  . Herniated lumbar intervertebral disc       GP    RUSSELL,CINDY 05/22/2012, 3:20 PM

## 2012-05-24 ENCOUNTER — Ambulatory Visit (HOSPITAL_COMMUNITY)
Admission: RE | Admit: 2012-05-24 | Discharge: 2012-05-24 | Disposition: A | Discharge status: Home / Self Care | Payer: Medicare Other | Source: Ambulatory Visit | Attending: Orthopedic Surgery | Admitting: Orthopedic Surgery

## 2012-05-24 NOTE — Progress Notes (Signed)
Physical Therapy Treatment Patient Details  Name: YULISA CHIRICO MRN: [id number] Date of Birth: [birth year]  Today's Date: 05/24/2012 Time: 1428-1510 PT Time Calculation (min): 42 min Charge: [place] x 8', massage x 8', therex 26'  Visit#: 5 of 9  Re-eval: 05/31/12    Authorization: medicare  Authorization Time Period:    Authorization Visit#: 5 of 9   Subjective: Symptoms/Limitations Symptoms: Pt stated she feels like she did too much exercioses before therapy today.  Pt very happy to report she does not required a manipulation for her knee, MD able to flex knee to 110 degrees.  LBP 4/10 center.   Pain Assessment Currently in Pain?: Yes Pain Score:   4 Pain Location: Back Pain Orientation: Lower  Objective :  Exercise/Treatments Standing Heel Raises: 10 reps Heel Raises Limitations: toeraises 10 reps Functional Squats: 10 reps Scapular Retraction: 5 reps;Both;Theraband Theraband Level (Scapular Retraction): Level 3 (Green) Row: 10 reps;Theraband Theraband Level (Row): Level 3 (Green) Shoulder Extension: 10 reps;Theraband Prone  Straight Leg Raise: 10 reps Other Prone Lumbar Exercises: shld ext x 10 Other Prone Lumbar Exercises: row 10x  Modalities Modalities: Ultrasound Manual Therapy Manual Therapy: Massage Massage: scar massage in prone  Ultrasound Ultrasound Location: B paraspinal mm Ultrasound Parameters: 1 MHz 1.5 w/cm2 continuous x 8 min Ultrasound Goals: Pain  Physical Therapy Assessment and Plan PT Assessment and Plan Clinical Impression Statement: Began postural strengthening with theraband with manual facilitation for correct mm activation.  Ended session with [place] to lumbar paraspinals and scar tissue massage to lumbar incision to reduce adhesions and pain.  Pt stated pain scale 0/10 following manual and modalitities. PT Plan: Next session begin proper body mechanics with lifting.    Goals    Problem List Patient Active Problem List  Diagnosis  .  ULCERATION OF INTESTINE  . Anxiety  . HTN (hypertension)  . Arthritis  . GERD (gastroesophageal reflux disease)  . OA (osteoarthritis) of knee  . Postop Hyponatremia  . Postop Acute blood loss anemia  . Difficulty in walking  . Stiffness of joint, not elsewhere classified, lower leg  . Weakness of right leg  . Spinal stenosis of lumbar region without neurogenic claudication  . Herniated lumbar intervertebral disc    PT - End of Session Activity Tolerance: Patient tolerated treatment well General Behavior During Session: [facility] for tasks performed Cognition: [facility] for tasks performed  GP    [name] 05/24/2012, 6:14 PM

## 2012-05-26 ENCOUNTER — Ambulatory Visit (INDEPENDENT_AMBULATORY_CARE_PROVIDER_SITE_OTHER): Payer: Medicare Other | Admitting: Internal Medicine

## 2012-05-26 ENCOUNTER — Encounter: Payer: Self-pay | Admitting: Internal Medicine

## 2012-05-26 ENCOUNTER — Ambulatory Visit (HOSPITAL_COMMUNITY)
Admission: RE | Admit: 2012-05-26 | Discharge: 2012-05-26 | Disposition: A | Discharge status: Home / Self Care | Payer: Medicare Other | Source: Ambulatory Visit | Attending: Orthopedic Surgery | Admitting: Orthopedic Surgery

## 2012-05-26 VITALS — BP 154/76 | HR 105 | Ht 60.0 in | Wt 180.0 lb

## 2012-05-26 DIAGNOSIS — R002 Palpitations: Secondary | ICD-10-CM

## 2012-05-26 MED ORDER — TELMISARTAN-HCTZ 80-12.5 MG PO TABS: 1.0000 | ORAL_TABLET | Freq: Every day | ORAL | Status: DC

## 2012-05-26 NOTE — Progress Notes (Signed)
out Physical Therapy Treatment Patient Details  Name: KATALINA MAGRI MRN: [id number] Date of Birth: Aug 27, 1940  Today's Date: 05/26/2012 Time: 4098-1191 PT Time Calculation (min): 39 min Charge:  [place] 8'; scar massage 5'; there ex 27' Visit#: 6 of 9  Re-eval: 06/07/12   Authorization: medicare  Authorization Visit#: 6 of 9   Subjective: Symptoms/Limitations Symptoms: Pt states she is doing her exercises at home.  Pain Assessment Currently in Pain?: Yes Pain Score:   4 Pain Location: Back Pain Orientation: Lower Pain Type: Surgical pain  Exercise/Treatments Heel Raises: 10 reps Scapular Retraction: Both;10 reps;Theraband Theraband Level (Scapular Retraction): Level 3 (Green) Row: 10 reps;Theraband Theraband Level (Row): Level 3 (Green) Shoulder Extension: 10 reps;Theraband Other Standing Lumbar Exercises: wall push ups; standing good posture with B UE flexion.x10   Prone  Straight Leg Raise: 10 reps Opposite Arm/Leg Raise: 5 reps Other Prone Lumbar Exercises: shld ext x 10 Other Prone Lumbar Exercises: row 10x     Modalities Modalities: Ultrasound Manual Therapy Manual Therapy: Massage Massage: scar massage x 5 min Ultrasound Ultrasound Location: R lumbar and SI  jt Ultrasound Parameters: 1 MHz 1.5 w/cm2 x 8 min. Ultrasound Goals: Pain  Physical Therapy Assessment and Plan PT Assessment and Plan Clinical Impression Statement: Pt given t-band for use at home,(forgot ex hand out need to give next treatment), started postrual exercises.  Pt states pain at end of treatment 0/10.  Scar tissue noted throughout incision.   PT Plan: begin stair climbing, and body mechanics next treatment.    Goals Home Exercise Program Pt will Perform Home Exercise Program: Independently PT Goal: Perform Home Exercise Program - Progress: Met PT Short Term Goals PT Short Term Goal 1: Pt to be walking 20 min PT Short Term Goal 1 - Progress: Progressing toward goal PT Short Term  Goal 2: Pt to be able to stand for 15 min PT Short Term Goal 2 - Progress: Progressing toward goal PT Short Term Goal 3: Strength to be improved 1/2 grade to improve ease of stairclimbing PT Short Term Goal 3 - Progress: Met PT Long Term Goals PT Long Term Goal 1: Pt to be able to walk without her cane PT Long Term Goal 1 - Progress: Met PT Long Term Goal 2: Pt strength to be increased by one grade to allow reciprocal stair climbing PT Long Term Goal 2 - Progress: Progressing toward goal Long Term Goal 3: Pt to be able to stand for 30 min to make a meal. Long Term Goal 3 Progress: Progressing toward goal Long Term Goal 4: Pt to be walking 30 minutes at a time. Long Term Goal 4 Progress: Progressing toward goal PT Long Term Goal 5: Pain to be no greater than a 2 80% of the day Long Term Goal 5 Progress: Progressing toward goal  Problem List Patient Active Problem List  Diagnosis  . ULCERATION OF INTESTINE  . Anxiety  . HTN (hypertension)  . Arthritis  . GERD (gastroesophageal reflux disease)  . OA (osteoarthritis) of knee  . Postop Hyponatremia  . Postop Acute blood loss anemia  . Difficulty in walking  . Stiffness of joint, not elsewhere classified, lower leg  . Weakness of right leg  . Spinal stenosis of lumbar region without neurogenic claudication  . Herniated lumbar intervertebral disc    PT - End of Session Activity Tolerance: Patient tolerated treatment well General Behavior During Session: [facility] for tasks performed Cognition: [facility] for tasks performed  GP    Citlali Gautney,CINDY  05/26/2012, 3:51 PM

## 2012-05-26 NOTE — Progress Notes (Signed)
Name: Glenda Terry    DOB: Dec 16, 1940  Age: 72 y.o.  MR#: [id number]       PCP:  Dwana Melena, MD      Insurance: Payor: [employer] MEDICARE  Plan: AARP MEDICARE COMPLETE  Product Type: *No Product type*    CC:   PT NOTES SHE HAS BEEN ON MICARDIS 80/12.5MG  FOR OVER 20 YEARS AND HER SODIUM LEVELS WERE NOTED AS TOO HIGH IN November AND PT WAS THEN CHANGED TO SIMPLE MICARDIS 80MG  PT HAS NOTED ELEVATED HEART RATE AND FATIGUE SINCE THIS CHANGE   VS Filed Vitals:   05/26/12 1136  BP: 154/76  Pulse: 105  Height: 5' (1.524 m)  Weight: 180 lb (81.647 kg)  SpO2: 97%    Weights Current Weight  05/26/12 180 lb (81.647 kg)  05/14/12 180 lb (81.647 kg)  04/20/12 183 lb (83.008 kg)    Blood Pressure  BP Readings from Last 3 Encounters:  05/26/12 154/76  05/14/12 154/93  04/22/12 145/78     Admit date:  (Not on file) Last encounter with RMR:  Visit date not found   Allergy Flexeril; Other; Keflex; and Statins  Current Outpatient Prescriptions  Medication Sig Dispense Refill  . albuterol (PROAIR HFA) 108 (90 BASE) MCG/ACT inhaler Inhale 2 puffs into the lungs every 6 (six) hours as needed. For COPD      . ALPRAZolam (XANAX) 0.5 MG tablet Take 0.5 mg by mouth every 8 (eight) hours as needed. For anxiety      . beclomethasone (QVAR) 80 MCG/ACT inhaler Inhale 2 puffs into the lungs 2 (two) times daily.       . diphenhydrAMINE (BENADRYL) 25 MG tablet Take 25 mg by mouth at bedtime as needed. For allergies      . EPINEPHrine (EPIPEN 2-PAK) 0.3 mg/0.3 mL DEVI Inject 0.3 mg into the muscle once. As needed for tree nut allergy      . esomeprazole (NEXIUM) 40 MG capsule Take 40 mg by mouth daily before breakfast.       . fluticasone (FLONASE) 50 MCG/ACT nasal spray Place 2 sprays into the nose every morning.       . loratadine (CLARITIN) 10 MG tablet Take 10 mg by mouth every morning.       . montelukast (SINGULAIR) 10 MG tablet Take 10 mg by mouth at bedtime.      Marland Kitchen  oxyCODONE-acetaminophen (PERCOCET/ROXICET) 5-325 MG per tablet Take 1-2 tablets by mouth every 4 (four) hours as needed.  60 tablet  0  . telmisartan (MICARDIS) 80 MG tablet Take 80 mg by mouth every morning.      . traMADol (ULTRAM) 50 MG tablet        No current facility-administered medications for this visit.    Discontinued Meds:    Medications Discontinued During This Encounter  Medication Reason  . cephALEXin (KEFLEX) 500 MG capsule Error    Patient Active Problem List  Diagnosis  . ULCERATION OF INTESTINE  . Anxiety  . HTN (hypertension)  . Arthritis  . GERD (gastroesophageal reflux disease)  . OA (osteoarthritis) of knee  . Postop Hyponatremia  . Postop Acute blood loss anemia  . Difficulty in walking  . Stiffness of joint, not elsewhere classified, lower leg  . Weakness of right leg  . Spinal stenosis of lumbar region without neurogenic claudication  . Herniated lumbar intervertebral disc    LABS    Component Value Date/Time   NA 134* 05/14/2012 1812   NA 135 04/11/2012 1350  NA 125* 01/27/2012 0459   K 3.5 05/14/2012 1812   K 4.2 04/11/2012 1350   K 3.7 01/27/2012 0459   CL 98 05/14/2012 1812   CL 102 04/11/2012 1350   CL 92* 01/27/2012 0459   CO2 24 05/14/2012 1812   CO2 26 04/11/2012 1350   CO2 24 01/27/2012 0459   GLUCOSE 97 05/14/2012 1812   GLUCOSE 126* 04/11/2012 1350   GLUCOSE 121* 01/27/2012 0459   BUN 12 05/14/2012 1812   BUN 15 04/11/2012 1350   BUN 12 01/27/2012 0459   CREATININE 1.00 05/14/2012 1812   CREATININE 0.93 04/11/2012 1350   CREATININE 1.27* 01/27/2012 0459   CALCIUM 9.8 05/14/2012 1812   CALCIUM 9.1 04/11/2012 1350   CALCIUM 8.7 01/27/2012 0459   GFRNONAA 56* 05/14/2012 1812   GFRNONAA 61* 04/11/2012 1350   GFRNONAA 42* 01/27/2012 0459   GFRAA 65* 05/14/2012 1812   GFRAA 71* 04/11/2012 1350   GFRAA 49* 01/27/2012 0459   CMP     Component Value Date/Time   NA 134* 05/14/2012 1812   K 3.5 05/14/2012 1812   CL 98 05/14/2012 1812   CO2 24 05/14/2012  1812   GLUCOSE 97 05/14/2012 1812   BUN 12 05/14/2012 1812   CREATININE 1.00 05/14/2012 1812   CALCIUM 9.8 05/14/2012 1812   PROT 7.6 05/14/2012 1812   ALBUMIN 3.7 05/14/2012 1812   AST 17 05/14/2012 1812   ALT 11 05/14/2012 1812   ALKPHOS 99 05/14/2012 1812   BILITOT 0.4 05/14/2012 1812   GFRNONAA 56* 05/14/2012 1812   GFRAA 65* 05/14/2012 1812       Component Value Date/Time   WBC 7.2 05/14/2012 1812   WBC 6.7 04/21/2012 0355   WBC 7.2 04/11/2012 1350   HGB 9.4* 05/14/2012 1812   HGB 8.4* 04/21/2012 1505   HGB 8.3* 04/21/2012 0355   HCT 31.1* 05/14/2012 1812   HCT 27.5* 04/21/2012 1505   HCT 27.1* 04/21/2012 0355   MCV 72.7* 05/14/2012 1812   MCV 73.8* 04/21/2012 0355   MCV 74.9* 04/11/2012 1350    Lipid Panel  No results found for this basename: chol, trig, hdl, cholhdl, vldl, ldlcalc    ABG    Component Value Date/Time   PHART 7.448* 06/01/2011 1100   PCO2ART 37.4 06/01/2011 1100   PO2ART 80.0 06/01/2011 1100   HCO3 25.4* 06/01/2011 1100   TCO2 22.8 06/01/2011 1100   O2SAT 96.2 06/01/2011 1100     Lab Results  Component Value Date   TSH 0.210* 02/09/2011   BNP (last 3 results) No results found for this basename: PROBNP,  in the last 8760 hours Cardiac Panel (last 3 results) No results found for this basename: CKTOTAL, CKMB, TROPONINI, RELINDX,  in the last 72 hours  Iron/TIBC/Ferritin    Component Value Date/Time   IRON 46 02/09/2011 0507   TIBC 365 02/09/2011 0507   FERRITIN 51 02/09/2011 0507     EKG Orders placed during the hospital encounter of 05/14/12  . ED EKG  . ED EKG  . EKG 12-LEAD  . EKG 12-LEAD  . EKG     Prior Assessment and Plan Problem List as of 05/26/2012     ICD-9-CM   Anxiety   HTN (hypertension)   Arthritis   GERD (gastroesophageal reflux disease)   ULCERATION OF INTESTINE   OA (osteoarthritis) of knee   Postop Hyponatremia   Postop Acute blood loss anemia   Difficulty in walking   Stiffness of joint, not elsewhere classified,  lower leg   Weakness of right leg    Spinal stenosis of lumbar region without neurogenic claudication   Herniated lumbar intervertebral disc       Imaging: Ct Abdomen Pelvis W Contrast  05/14/2012  *RADIOLOGY REPORT*  Clinical Data: Abdominal pain, she will  CT ABDOMEN AND PELVIS WITH CONTRAST  Technique:  Multidetector CT imaging of the abdomen and pelvis was performed following the standard protocol during bolus administration of intravenous contrast.  Contrast:  Insert contra100 ml Omnipaque 300  Comparison: CT lumbar spine 10/22/2011, CT 05/17/2011  Findings: Lung bases are clear.  No pericardial fluid.  Low density lesion in the posterior right hepatic lobe measures 16 mm which is unchanged from comparison exam.  The gallbladder, pancreas, spleen, adrenal glands, kidneys are normal.  The stomach, small bowel, appendix, and cecum are normal.  The colon and rectosigmoid colon are normal.  Abdominal aorta is normal caliber.  No retroperitoneal periportal lymphadenopathy.  No free fluid the pelvis.  The bladder is normal.  Post hysterectomy anatomy.  No pelvic lymphadenopathy. Review of  bone windows demonstrates no aggressive osseous lesions.  There is postsurgical change consistent with recent laminectomy at L5.  No complicating features.  IMPRESSION:  1.  No acute findings in the abdomen pelvis. 2.  Normal appendix. 3.  Postsurgical change at the lumbar spine without complicating features.   Original Report Authenticated By: Genevive Bi, M.D.

## 2012-05-26 NOTE — Progress Notes (Signed)
HPI Patient says that she had knee replaced in December.  While at [facility] felt and heard heart beating faster  Last 5 to 10 min  Daily  Multi[le times per day.  No dizziness   Has chronic SOB with asthma.  Hard to say if worse.  Patient also notes BP is higher since taken off of MIcardis/HCTZ  Now on Micardis.  Allergies  Allergen Reactions  . Flexeril (Cyclobenzaprine) Anaphylaxis  . Other Anaphylaxis    ALL TREE NUTS  . Keflex (Cephalexin)     ITCH  . Statins Other (See Comments)    is pain and swelling     Current Outpatient Prescriptions  Medication Sig Dispense Refill  . albuterol (PROAIR HFA) 108 (90 BASE) MCG/ACT inhaler Inhale 2 puffs into the lungs every 6 (six) hours as needed. For COPD      . ALPRAZolam (XANAX) 0.5 MG tablet Take 0.5 mg by mouth every 8 (eight) hours as needed. For anxiety      . beclomethasone (QVAR) 80 MCG/ACT inhaler Inhale 2 puffs into the lungs 2 (two) times daily.       . diphenhydrAMINE (BENADRYL) 25 MG tablet Take 25 mg by mouth at bedtime as needed. For allergies      . EPINEPHrine (EPIPEN 2-PAK) 0.3 mg/0.3 mL DEVI Inject 0.3 mg into the muscle once. As needed for tree nut allergy      . esomeprazole (NEXIUM) 40 MG capsule Take 40 mg by mouth daily before breakfast.       . fluticasone (FLONASE) 50 MCG/ACT nasal spray Place 2 sprays into the nose every morning.       . loratadine (CLARITIN) 10 MG tablet Take 10 mg by mouth every morning.       . montelukast (SINGULAIR) 10 MG tablet Take 10 mg by mouth at bedtime.      Marland Kitchen oxyCODONE-acetaminophen (PERCOCET/ROXICET) 5-325 MG per tablet Take 1-2 tablets by mouth every 4 (four) hours as needed.  60 tablet  0  . telmisartan (MICARDIS) 80 MG tablet Take 80 mg by mouth every morning.      . traMADol (ULTRAM) 50 MG tablet        No current facility-administered medications for this visit.    Past Medical History  Diagnosis Date  . COPD (chronic obstructive pulmonary disease)   . Back pain, chronic    . Anxiety   . Hypertension   . GERD (gastroesophageal reflux disease)   . Shortness of breath     OCCASIONAL  . Arthritis   . Anemia NOV 2013  . Complication of anesthesia 1985    VITALS SIGNS DROPPED AND COULD NOT TAKE PAIN MEDS UNTIL VITALS IMPROVED    Past Surgical History  Procedure Laterality Date  . Hysterotomy    . Tubal ligation    . Abdominal hysterectomy    . Knee arthroscopy    . Total knee arthroplasty  01/24/2012    Procedure: TOTAL KNEE ARTHROPLASTY;  Surgeon: Loanne Drilling, MD;  Location: [facility];  Service: Orthopedics;  Laterality: Right;  . Decompressive lumbar laminectomy level 2  04/20/2012    Procedure: DECOMPRESSIVE LUMBAR LAMINECTOMY LEVEL 2;  Surgeon: Jacki Cones, MD;  Location: [facility];  Service: Orthopedics;  Laterality: Right;  Decompressive Lumbar Laminectomy of the L4 - L5 and L5 - S1 Complete/Laminectomy L5 on the Right (X-Ray)    Family History  Problem Relation Age of Onset  . Breast cancer Sister     History  Social History  . Marital Status: Single    Spouse Name: N/A    Number of Children: N/A  . Years of Education: N/A   Occupational History  . Not on file.   Social History Main Topics  . Smoking status: Former Smoker -- 2.00 packs/day for 40 years    Quit date: 01/19/1992  . Smokeless tobacco: Never Used  . Alcohol Use: No  . Drug Use: No  . Sexually Active: No   Other Topics Concern  . Not on file   Social History Narrative  . No narrative on file    Review of Systems:  All systems reviewed.  They are negative to the above problem except as previously stated.  Vital Signs: BP 154/76  Pulse 105  Ht 5' (1.524 m)  Wt 180 lb (81.647 kg)  BMI 35.15 kg/m2  SpO2 97%  Physical Exam Patient in NAD HEENT:  Normocephalic, atraumatic. EOMI, PERRLA.  Neck: JVP is normal.  No bruits.  Lungs: clear to auscultation. No rales no wheezes.  Heart: Regular rate and rhythm. Normal S1, S2. No S3.   No significant murmurs. PMI  not displaced.  Abdomen:  Supple, nontender. Normal bowel sounds. No masses. No hepatomegaly.  Extremities:   Good distal pulses throughout. No lower extremity edema.  Musculoskeletal :moving all extremities.  Neuro:   alert and oriented x3.  CN II-XII grossly intact.  EKG  SR 82   Assessment and Plan:  1.  Palpitations  Not sure what they represent  Will set up for event monitor Check TSH next wk  2.  Dyspnea  Will set up for echo  May represent pulmonary problems  3.  HTN  I have asked there to resume Micardis/HCTZ  Had tolerated for years. K was fine 1 year ago  Will check BMET next wk.

## 2012-05-26 NOTE — Patient Instructions (Addendum)
Your physician recommends that you schedule a follow-up appointment in: WE WILL CALL YOU WITH THE RESULTS/NEXT STEPS  Your physician has requested that you have an echocardiogram. Echocardiography is a painless test that uses sound waves to create images of your heart. It provides your doctor with information about the size and shape of your heart and how well your heart's chambers and valves are working. This procedure takes approximately one hour. There are no restrictions for this procedure.  Your physician has recommended that you wear an event monitor. Event monitors are medical devices that record the heart's electrical activity. Doctors most often [place] these monitors to diagnose arrhythmias. Arrhythmias are problems with the speed or rhythm of the heartbeat. The monitor is a small, portable device. You can wear one while you do your normal daily activities. This is usually used to diagnose what is causing palpitations/syncope (passing out).FOR THREE WEEKS TIL 06-26-12  Your physician has recommended you make the following change in your medication:   1) STOP MICARDIS 2) START MICARDIS 80/12.5MG   Your physician recommends that you return for lab work in: Wednesday 05-31-12 (SLIPS GIVEN) BMET,TSH

## 2012-05-29 ENCOUNTER — Ambulatory Visit (HOSPITAL_COMMUNITY)
Admission: RE | Admit: 2012-05-29 | Discharge: 2012-05-29 | Disposition: A | Discharge status: Home / Self Care | Payer: Medicare Other | Source: Ambulatory Visit | Attending: Orthopedic Surgery | Admitting: Orthopedic Surgery

## 2012-05-29 NOTE — Progress Notes (Signed)
Physical Therapy Treatment Patient Details  Name: Glenda Terry MRN: [id number] Date of Birth: June 26, 1940  Today's Date: 05/29/2012 Time: 1430-1510 PT Time Calculation (min): 40 min  Visit#: 7 of 8  Re-eval: 05/31/12    Authorization: medicare-[employer]  Authorization Visit#: 7 of 9   Subjective: Symptoms/Limitations Symptoms: Pt states her back is doing well but she feel stiff all over; feels as if this is due to the weather,(cold and icy)    Exercise/Treatments     Stretches Active Hamstring Stretch: 3 reps;30 seconds Single Knee to Chest Stretch: 3 reps;30 seconds   Standing Other Standing Lumbar Exercises: wall pushups; B UE flex Prone  Straight Leg Raise: 10 reps Opposite Arm/Leg Raise: 5 reps Other Prone Lumbar Exercises: shoulder extension, chin tucks, rows x 10 reps.  Modalities Modalities: Ultrasound Ultrasound Ultrasound Location: lumbar paraspinal mm on R Ultrasound Parameters: 1 MHA 1.5 w/cm2 x 8 min Ultrasound Goals: Pain  Physical Therapy Assessment and Plan PT Assessment and Plan Clinical Impression Statement: Pt improved pain after treatment today; Pt has good form on nonweight bearing exs but still needs verbal and manual cuing to complete weight bearing stabilization exercises correctily. Pt will benefit from skilled therapeutic intervention in order to improve on the following deficits: Decreased range of motion;Decreased strength;Pain;Decreased activity tolerance Rehab Potential: Good PT Frequency: Min 3X/week PT Treatment/Interventions: Stair training;Therapeutic activities;Therapeutic exercise;Patient/family education;Modalities;Manual techniques PT Plan: begin stair climbing and reevaluate next treatment. Pt only had 3 visits approved.Marland KitchenMarland KitchenWill try and rebuttal as pt is post surgery and needs 9.      Goals  progressing  Problem List Patient Active Problem List  Diagnosis  . ULCERATION OF INTESTINE  . Anxiety  . HTN  (hypertension)  . Arthritis  . GERD (gastroesophageal reflux disease)  . OA (osteoarthritis) of knee  . Postop Hyponatremia  . Postop Acute blood loss anemia  . Difficulty in walking  . Stiffness of joint, not elsewhere classified, lower leg  . Weakness of right leg  . Spinal stenosis of lumbar region without neurogenic claudication  . Herniated lumbar intervertebral disc    PT - End of Session Activity Tolerance: Patient tolerated treatment well General Behavior During Session: [facility] for tasks performed Cognition: [facility] for tasks performed  GP    RUSSELL,CINDY 05/29/2012, 4:53 PM

## 2012-05-31 ENCOUNTER — Ambulatory Visit (HOSPITAL_COMMUNITY)
Admission: RE | Admit: 2012-05-31 | Discharge: 2012-05-31 | Disposition: A | Discharge status: Home / Self Care | Payer: Medicare Other | Source: Ambulatory Visit | Attending: Orthopedic Surgery | Admitting: Orthopedic Surgery

## 2012-05-31 LAB — BASIC METABOLIC PANEL
Chloride: 103 mEq/L (ref 96–112)
Potassium: 3.7 mEq/L (ref 3.5–5.3)

## 2012-05-31 NOTE — Progress Notes (Signed)
Physical Therapy Treatment Patient Details  Name: Glenda Terry MRN: [id number] Date of Birth: 11-20-1940 Charge:  There ex 40 Today's Date: 05/31/2012 Time: 4098-1191 PT Time Calculation (min): 42 min  Visit#: 8 of 9  Re-eval: 05/31/12  Authorization: medicare-[employer]   Subjective: Symptoms/Limitations Symptoms: Pt states that she is fatigued do to the fact that she left her cane at therapy last treatment and has been walking for the past two days with no assistive device. Pain Assessment Currently in Pain?: No/denies    Exercise/Treatments Stretches Active Hamstring Stretch: 3 reps;30 seconds Standing Lifting: 10 reps;Weights Lifting Weights (lbs): 1# From chair. Other Standing Lumbar Exercises: wall pushups; B UE flex with 1 # x 10@ Supine Bridge: 15 reps Straight Leg Raise: 15 reps Sidelying Hip Abduction: 10 reps;Weights Hip Abduction Weights (lbs): 3# Prone  Straight Leg Raise: 10 reps Opposite Arm/Leg Raise: 10 reps Other Prone Lumbar Exercises: shoulder extension, chin tucks, rows x 10 reps.1# Other Prone Lumbar Exercises: W-back  with 1# x 10     Physical Therapy Assessment and Plan PT Assessment and Plan Clinical Impression Statement: worked on body mechanics while lifting to keep pt pain free.  Pt continues to demonstrate better stabiliation with more advanced exercises.  Trial of no Ultrasound today as pt was pain free. Palpation showed no mm spasms.  Pt secondary insurance is medicaid and this is what was authorized for three visits . PT Plan: Pt  to be reevaluated next treatment assess stair climbing ability.     Problem List Patient Active Problem List  Diagnosis  . ULCERATION OF INTESTINE  . Anxiety  . HTN (hypertension)  . Arthritis  . GERD (gastroesophageal reflux disease)  . OA (osteoarthritis) of knee  . Postop Hyponatremia  . Postop Acute blood loss anemia  . Difficulty in walking  . Stiffness of joint, not elsewhere  classified, lower leg  . Weakness of right leg  . Spinal stenosis of lumbar region without neurogenic claudication  . Herniated lumbar intervertebral disc     GP    Glenda Terry,Glenda Terry 05/31/2012, 4:39 PM

## 2012-06-01 ENCOUNTER — Ambulatory Visit (HOSPITAL_COMMUNITY)
Admission: RE | Admit: 2012-06-01 | Discharge: 2012-06-01 | Disposition: A | Discharge status: Home / Self Care | Payer: Medicare Other | Source: Ambulatory Visit | Attending: Internal Medicine | Admitting: Internal Medicine

## 2012-06-01 DIAGNOSIS — J4489 Other specified chronic obstructive pulmonary disease: Secondary | ICD-10-CM | POA: Insufficient documentation

## 2012-06-01 DIAGNOSIS — I359 Nonrheumatic aortic valve disorder, unspecified: Secondary | ICD-10-CM

## 2012-06-01 DIAGNOSIS — R0609 Other forms of dyspnea: Secondary | ICD-10-CM | POA: Insufficient documentation

## 2012-06-01 DIAGNOSIS — R002 Palpitations: Secondary | ICD-10-CM | POA: Insufficient documentation

## 2012-06-01 DIAGNOSIS — I10 Essential (primary) hypertension: Secondary | ICD-10-CM | POA: Insufficient documentation

## 2012-06-01 DIAGNOSIS — R0989 Other specified symptoms and signs involving the circulatory and respiratory systems: Secondary | ICD-10-CM | POA: Insufficient documentation

## 2012-06-01 NOTE — Progress Notes (Signed)
*  PRELIMINARY RESULTS* Echocardiogram 2D Echocardiogram has been performed.  Glenda Terry 06/01/2012, 12:58 PM

## 2012-06-02 ENCOUNTER — Ambulatory Visit (HOSPITAL_COMMUNITY)
Admission: RE | Admit: 2012-06-02 | Discharge: 2012-06-02 | Disposition: A | Discharge status: Home / Self Care | Payer: Medicare Other | Source: Ambulatory Visit | Attending: Orthopedic Surgery | Admitting: Orthopedic Surgery

## 2012-06-02 ENCOUNTER — Ambulatory Visit (HOSPITAL_COMMUNITY): Payer: Medicare Other | Admitting: Physical Therapy

## 2012-06-02 ENCOUNTER — Encounter: Payer: Self-pay | Admitting: *Deleted

## 2012-06-02 NOTE — Evaluation (Signed)
Physical Therapy Evaluation  Patient Details  Name: Glenda Terry MRN: [id number] Date of Birth: June 14, 1940 Charge:  Mm test, self care x12, gt x 12 Today's Date: 06/02/2012 Time:845  - 927                Visit#: 9 of 9  Re-eval:   Assessment Diagnosis: Lumbar surgery Surgical Date: 04/20/12 Next MD Visit: 06/02/2012 Prior Therapy: N/A  Authorization: medicare- [employer]     Authorization Visit#: 9 of 9   Past Medical History:  Past Medical History  Diagnosis Date  . COPD (chronic obstructive pulmonary disease)   . Back pain, chronic   . Anxiety   . Hypertension   . GERD (gastroesophageal reflux disease)   . Shortness of breath     OCCASIONAL  . Arthritis   . Anemia NOV 2013  . Complication of anesthesia 1985    VITALS SIGNS DROPPED AND COULD NOT TAKE PAIN MEDS UNTIL VITALS IMPROVED   Past Surgical History:  Past Surgical History  Procedure Laterality Date  . Hysterotomy    . Tubal ligation    . Abdominal hysterectomy    . Knee arthroscopy    . Total knee arthroplasty  01/24/2012    Procedure: TOTAL KNEE ARTHROPLASTY;  Surgeon: Loanne Drilling, MD;  Location: [facility];  Service: Orthopedics;  Laterality: Right;  . Decompressive lumbar laminectomy level 2  04/20/2012    Procedure: DECOMPRESSIVE LUMBAR LAMINECTOMY LEVEL 2;  Surgeon: Jacki Cones, MD;  Location: [facility];  Service: Orthopedics;  Laterality: Right;  Decompressive Lumbar Laminectomy of the L4 - L5 and L5 - S1 Complete/Laminectomy L5 on the Right (X-Ray)    Subjective Symptoms/Limitations Pertinent History: Pt had a TKR in November of 2013 How long can you sit comfortably?: The pt is able to sit as long as she wants. How long can you stand comfortably?: She is able to stand for 15-20 minutes was  10-15 minutes. How long can you walk comfortably?: She is walking with and without a cane for 25 minutes;  she was walking with a cane she is able to walk for  ten minutes.  Special Tests: Pt states  she is waking up 4-5 times a night.  Pt states this is due to her carpal tunnel not her back Pain Assessment Currently in Pain?: No/denies  Precautions/Restrictions  Precautions Precautions: Back  Balance Screening Balance Screen Has the patient fallen in the past 6 months: No Has the patient had a decrease in activity level because of a fear of falling? : No Is the patient reluctant to leave their home because of a fear of falling? : No    Assessment RLE Strength Right Hip Flexion: 5/5 Right Hip Extension: 4/5 (was 3/5) Right Hip ABduction: 5/5 Right Knee Flexion: 5/5 (was 3+/5) Right Knee Extension: 5/5 (was 5-/5) Right Ankle Dorsiflexion: 3+/5 (was 3/5) LLE Strength Left Hip Flexion: 5/5 Left Hip Extension: 5/5 (was 4/5) Left Hip ABduction: 5/5 Left Knee Flexion: 5/5 Left Knee Extension: 5/5 Left Ankle Dorsiflexion: 5/5 Lumbar AROM Lumbar Extension: wnl was decreased 20% Lumbar - Right Side Bend: wnl was decreased 20%  Exercise/Treatments Standing Lifting: 10 reps;Weights Lifting Weights (lbs): 1# From chair. Other Standing Lumbar Exercises: wall pushups; B UE flex with 1 # x 10@ Other Standing Lumbar Exercises: Gt outside without cane incline; grass and steps    Prone  Straight Leg Raise: 10 reps   Physical Therapy Assessment and Plan PT Assessment and Plan Clinical Impression Statement:  Pt doing very well; mm strength and ROM is WFL.  Pt continues to be painfree.   Pt I in ex and is walking everyday.  No further need for skilled care. Clinical Impairments Affecting Rehab Potential: Pt needs back surgery.  Make sure pt back is not aggrevated PT Plan: discharge.    Goals Home Exercise Program Pt will Perform Home Exercise Program: Independently PT Goal: Perform Home Exercise Program - Progress: Met PT Short Term Goals PT Short Term Goal 1: Pt to be walking 20 min PT Short Term Goal 1 - Progress: Met PT Short Term Goal 2: Pt to be able to stand for 15  min PT Short Term Goal 2 - Progress: Met PT Short Term Goal 3: Strength to be improved 1/2 grade to improve ease of stairclimbing PT Short Term Goal 3 - Progress: Met PT Long Term Goals PT Long Term Goal 1: Pt to be able to walk without her cane PT Long Term Goal 1 - Progress: Met PT Long Term Goal 2: Pt strength to be increased by one grade to allow reciprocal stair climbing PT Long Term Goal 2 - Progress: Met Long Term Goal 3: Pt to be able to stand for 30 min to make a meal. Long Term Goal 3 Progress: Progressing toward goal Long Term Goal 4: Pt to be walking 30 minutes at a time. Long Term Goal 4 Progress: Met PT Long Term Goal 5: Pain to be no greater than a 2 80% of the day- pt pain is not back relaated Long Term Goal 5 Progress: Met  Problem List Patient Active Problem List  Diagnosis  . ULCERATION OF INTESTINE  . Anxiety  . HTN (hypertension)  . Arthritis  . GERD (gastroesophageal reflux disease)  . OA (osteoarthritis) of knee  . Postop Hyponatremia  . Postop Acute blood loss anemia  . Difficulty in walking  . Stiffness of joint, not elsewhere classified, lower leg  . Weakness of right leg  . Spinal stenosis of lumbar region without neurogenic claudication  . Herniated lumbar intervertebral disc    PT - End of Session Activity Tolerance: Patient tolerated treatment well General Behavior During Session: [facility] for tasks performed Cognition: [facility] for tasks performed  GP Functional Assessment Tool Used: Oswestry; clinical judgement- self care affected by rotator cuff tear-pt states back is no problem Functional Limitation: Self care Mobility: Walking and Moving Around Goal Status (J1914): At least 1 percent but less than 20 percent impaired, limited or restricted Mobility: Walking and Moving Around Discharge Status [phone]): At least 20 percent but less than 40 percent impaired, limited or restricted  RUSSELL,CINDY 06/02/2012, 9:33 AM  Physician Documentation Your  signature is required to indicate approval of the treatment plan as stated above.  Please sign and either send electronically or make a copy of this report for your files and return this physician signed original.   Please mark one 1.__approve of plan  2. ___approve of plan with the following conditions.   ______________________________                                                          _____________________ Physician Signature  Date  

## 2012-06-06 ENCOUNTER — Other Ambulatory Visit (HOSPITAL_BASED_OUTPATIENT_CLINIC_OR_DEPARTMENT_OTHER): Payer: Self-pay | Admitting: Internal Medicine

## 2012-06-12 ENCOUNTER — Telehealth: Payer: Self-pay | Admitting: Internal Medicine

## 2012-06-12 DIAGNOSIS — R002 Palpitations: Secondary | ICD-10-CM

## 2012-06-12 NOTE — Telephone Encounter (Signed)
Patient states that her BP medicine was changed and now she is having swelling in her leg.  / tgs

## 2012-06-12 NOTE — Telephone Encounter (Signed)
I put her back on Micardis HCTZ  She wanted to go back Had tolerated it before. Would get BMET and BNP. She could go back to Micardis and we will need to follow bp

## 2012-06-12 NOTE — Telephone Encounter (Signed)
FYI:.left message to have patient return my call on pt mobile, unable to leave message on home phone to clarify more information about current sxs, will send notation to MD Tenny Craw for review at this time until call back from pt noted

## 2012-06-13 HISTORY — PX: CARPAL TUNNEL RELEASE: SHX101

## 2012-06-13 NOTE — Telephone Encounter (Signed)
Called [facility] and spoke with April whom advised the pt insurance will no longer fill the combo pill for the Micardis HCT 80/12.5mg  thus a notation was placed on pt medication refill to advise to take both pills in combo, pt however did not understand, was advised the pt has refills on both  Micardis 80mg  and HCTZ 12.5mg  for the amount of 3 months, called pt to advise the instructions, pt was not available, will await return call to address

## 2012-06-13 NOTE — Telephone Encounter (Signed)
Called pt to clarify, pt noted that the pharmacy only gave her the HCTZ pill and she has not taken the Micardis/HCTZ combo, pt advised she does have the combo at home and will re-start that medication today, will call pharmacy once open to clarify RX sent per noted combo pill sent on 05-26-12, advised pt this nurse will call her after pharmacy is notified, pt understood

## 2012-06-14 NOTE — Telephone Encounter (Signed)
Please advise if pt still needs to have blood draw per noted she was not taking Micardis at all per pharmacy only gave her the HCTZ RX and pt was advised to re-start the few combo pills she had left on 06-13-12, pharmacy noted pt insurance would not pay for the combo pill however this was not understood by the pt and she only took the one HCTZ medication, pt notes understanding to take the Micardis 80mg  and HCTZ 12.5mg  pill separately due to insurance non-payment, pt also advised that her swelling has now went down since she re-started the combo and had enough through today, noted she will start 2 separate pills in the am  Pt advised that she will start taking the Micardis 80mg  and HCTZ 12.5mg  (two separate pills) now, pt advised that she does have 3 months supply for both the Micardis 80mg  and HCTZ 12.5mg  and wants to wait to have another refill, advised we will inform MD Tenny Craw of update to clarify if labs still need to be drawn per update

## 2012-06-15 NOTE — Telephone Encounter (Signed)
Please get BMET in 10 to 14 days from restarting all

## 2012-06-15 NOTE — Telephone Encounter (Signed)
.  left message to have patient return my call.  

## 2012-06-16 ENCOUNTER — Telehealth: Payer: Self-pay | Admitting: Internal Medicine

## 2012-06-16 DIAGNOSIS — I10 Essential (primary) hypertension: Secondary | ICD-10-CM

## 2012-06-16 NOTE — Telephone Encounter (Signed)
Noted pt event monitor order as un-releasable, cancelled order and placed new one in chart for release

## 2012-06-16 NOTE — Telephone Encounter (Signed)
Please advise 

## 2012-06-16 NOTE — Telephone Encounter (Signed)
Was given verbal order from MD Tenny Craw to advise pt to start taking the Micardis by itself and stop the HCTZ for 2-3 days to see if the itching stops per to clarify which medication is causing the itching, left vm on pt machine to advise

## 2012-06-16 NOTE — Telephone Encounter (Signed)
PT WAS PUT ON TELMISARTAN AND SHE STATES IT MAKES HER ITCH. SHE NEEDS TO KNOW WHAT TO DO ABOUT IT/TMJ

## 2012-06-19 ENCOUNTER — Other Ambulatory Visit: Payer: Self-pay | Admitting: *Deleted

## 2012-06-19 DIAGNOSIS — R899 Unspecified abnormal finding in specimens from other organs, systems and tissues: Secondary | ICD-10-CM

## 2012-06-19 DIAGNOSIS — R0602 Shortness of breath: Secondary | ICD-10-CM

## 2012-06-19 DIAGNOSIS — R002 Palpitations: Secondary | ICD-10-CM

## 2012-06-20 NOTE — Telephone Encounter (Signed)
.  left message to have patient return my call. Per noted not in [place] office today but did want to check with pt if ceasing the HCTZ helped with the itching and if not then MD Tenny Craw advised to start Cozaar 50mg  and Stop Telmisartan and re check Bmet in 10days after medication change, left detailed message on pt home phone that this nurse is not in the office today however wanted to contact pt per MD Ross response and to update with pt, if unable to reach out to pt again prior to leaving [place] office today, advised pt via vm that I will contact her again tomorrow from [place] location

## 2012-06-20 NOTE — Telephone Encounter (Signed)
I do not understand why having itching now.  QUestion filler in pill D/C telmisartan Try Cozaar 50 mg.  F/U BMET in 10 days.

## 2012-06-21 MED ORDER — LOSARTAN POTASSIUM 50 MG PO TABS: 50.0000 mg | ORAL_TABLET | Freq: Every day | ORAL | Status: DC

## 2012-06-21 NOTE — Telephone Encounter (Signed)
Spoke to pt to advise results/instructions. Pt understood. Pt will start Cozaar 50mg once daily tomorrow 06-22-12 and have labs drawn on 07-03-12,and continue HCTZ 12.5mg  new medication sent to [facility] per pt, advised if itching continues to please contact office, pt understood, pt also requested the results to her cardiac monitor, read pt notations made by MD Ross as noted:  Notes Recorded by Paula V Ross, MD on 06/20/2012 at 1:25 PM No arrhythmia on one strip.  per pt noted to have dropped monitor in toilet as well as a few recordings noted with leads on backwards, thus causing limited information for MD Ross to interpret, pt understood and advised she is still receiving phone calls from [employer] concerning the return on the equipment, noted pt returned equipment last week and was shipped back to company, gave pt phone number to [employer] customer service to call and advise, pt understood all directions, placed orders/reminder to release for lab draw in pt chart  

## 2012-06-21 NOTE — Telephone Encounter (Signed)
Spoke to pt to advise results/instructions. Pt understood. Pt will start Cozaar 50mg  once daily tomorrow 06-22-12 and have labs drawn on 07-03-12,and continue HCTZ 12.5mg   new medication sent to [facility] per pt, advised if itching continues to please contact office, pt understood, pt also requested the results to her cardiac monitor, read pt notations made by MD Tenny Craw as noted:  Notes Recorded by Pricilla Riffle, MD on 06/20/2012 at 1:25 PM No arrhythmia on one strip.  per pt noted to have dropped monitor in toilet as well as a few recordings noted with leads on backwards, thus causing limited information for MD Ross to interpret, pt understood and advised she is still receiving phone calls from [employer] concerning the return on the equipment, noted pt returned equipment last week and was shipped back to company, gave pt phone number to [employer] customer service to call and advise, pt understood all directions, placed orders/reminder to release for lab draw in pt chart

## 2012-07-03 ENCOUNTER — Telehealth: Payer: Self-pay | Admitting: Internal Medicine

## 2012-07-03 NOTE — Telephone Encounter (Signed)
Called pt to advise she does NOT need to hold her bp medications, pt states that she is no longer having any itching since the medication switch and her bp looks better since the switch, pt will continue to take bp medications and have her labs drawn tomorrow as advised

## 2012-07-03 NOTE — Telephone Encounter (Signed)
Patient states that she is to have lab work done tomorrow and wants to know if she is to stop her BP meds prior to / tgs

## 2012-07-04 LAB — BASIC METABOLIC PANEL
CO2: 30 mEq/L (ref 19–32)
Chloride: 101 mEq/L (ref 96–112)
Glucose, Bld: 78 mg/dL (ref 70–99)
Potassium: 4 mEq/L (ref 3.5–5.3)
Sodium: 136 mEq/L (ref 135–145)

## 2012-07-10 ENCOUNTER — Telehealth: Payer: Self-pay | Admitting: Internal Medicine

## 2012-07-10 NOTE — Telephone Encounter (Signed)
Pt clarified that since last Tuesday both her feet started swelling, mostly her right foot, pt did not see any relief with propping feet at night,pt notes BP readings are better, the highest it has been was 141/84 with high HR of 109 but average BP has been 125/69 HR 71, no other sxs than the swelling

## 2012-07-10 NOTE — Telephone Encounter (Signed)
Patient states that she is having problems with feet swelling. / tgs

## 2012-07-11 NOTE — Telephone Encounter (Signed)
Can call in Rx for Lasix 20 with KCL 10.  Take 1 to 2 x per week. If doing more will need to come in fore labs. Would get BMET and BNP in 14 days from starting.

## 2012-07-12 NOTE — Telephone Encounter (Signed)
[id number] pt and she advised that she is better now per used ted hose and noted the swelling went down completely, pt declined the medication changes noted per now feels better, pt advised to call back into office if her sxs worsen/return, pt understood

## 2012-10-31 ENCOUNTER — Encounter: Payer: Self-pay | Admitting: Internal Medicine

## 2012-11-01 ENCOUNTER — Other Ambulatory Visit: Payer: Self-pay | Admitting: Internal Medicine

## 2012-11-01 ENCOUNTER — Ambulatory Visit (INDEPENDENT_AMBULATORY_CARE_PROVIDER_SITE_OTHER): Payer: Medicare Other | Admitting: Gastroenterology

## 2012-11-01 ENCOUNTER — Encounter: Payer: Self-pay | Admitting: Gastroenterology

## 2012-11-01 VITALS — BP 122/73 | HR 95 | Temp 97.9°F | Ht 60.0 in | Wt 194.6 lb

## 2012-11-01 DIAGNOSIS — D509 Iron deficiency anemia, unspecified: Secondary | ICD-10-CM

## 2012-11-01 DIAGNOSIS — Z800 Family history of malignant neoplasm of digestive organs: Secondary | ICD-10-CM

## 2012-11-01 MED ORDER — PEG-KCL-NACL-NASULF-NA ASC-C 100 G PO SOLR: 1.0000 | ORAL | Status: DC

## 2012-11-01 NOTE — Progress Notes (Signed)
Primary Care Physician:  Catalina Pizza, MD  Primary Gastroenterologist:  Roetta Sessions, MD   Chief Complaint  Patient presents with  . Colonoscopy    HPI:  Glenda Terry is a 72 y.o. female here to consider pursuing surveillance colonoscopy. Last colonoscopy in April 2010. No history of colon polyps. Family history strongly positive for colon cancer. 2 sisters have heart he succumbed to this disease, one currently undergoing treatment and another currently with stage IV disease and terminal.  Patient denies any constipation, diarrhea, melena, rectal bleeding, abdominal pain, vomiting, heartburn, dysphagia, weight loss.  Labs from March 2014 showed a low iron of 37, TIBC 401 iron saturation is low at 9%, ferritin low normal at 26, hemoglobin 8.8, hematocrit 29.8, MCV 73, platelets 386,000, BUN 7, creatinine 0.95, total bilirubin 0.4, alkaline phosphatase 89, AST 18, ALT 9, albumin 3.7.  She is on methotrexate and prednisone chronically for inflammatory arthritis. Within the past year she had knee replacement and back surgery. Denies transfusions related to her surgeries. The last normal hemoglobin that I have available is from January 2011 at 13.6.  Current Outpatient Prescriptions  Medication Sig Dispense Refill  . albuterol (PROAIR HFA) 108 (90 BASE) MCG/ACT inhaler Inhale 2 puffs into the lungs every 6 (six) hours as needed. For COPD      . ALPRAZolam (XANAX) 0.5 MG tablet Take 0.5 mg by mouth every 8 (eight) hours as needed. For anxiety      . beclomethasone (QVAR) 80 MCG/ACT inhaler Inhale 2 puffs into the lungs 2 (two) times daily.       . diphenhydrAMINE (BENADRYL) 25 MG tablet Take 25 mg by mouth at bedtime as needed. For allergies      . EPINEPHrine (EPIPEN 2-PAK) 0.3 mg/0.3 mL DEVI Inject 0.3 mg into the muscle once. As needed for tree nut allergy      . esomeprazole (NEXIUM) 40 MG capsule Take 40 mg by mouth daily before breakfast.       . folic acid (FOLVITE) 1 MG tablet Take  1 mg by mouth daily.      . methotrexate 25 MG/ML injection 40 mg/m2 once a week.       . predniSONE (DELTASONE) 10 MG tablet Take 5 mg by mouth daily.       Marland Kitchen telmisartan-hydrochlorothiazide (MICARDIS HCT) 80-12.5 MG per tablet Take 1 tablet by mouth daily.       . traMADol (ULTRAM) 50 MG tablet Take 50 mg by mouth every 6 (six) hours as needed.        No current facility-administered medications for this visit.    Allergies as of 11/01/2012 - Review Complete 11/01/2012  Allergen Reaction Noted  . Flexeril [cyclobenzaprine] Anaphylaxis 01/12/2012  . Other Anaphylaxis 02/08/2011  . Keflex [cephalexin]  05/26/2012  . Statins Other (See Comments) 05/17/2011    Past Medical History  Diagnosis Date  . COPD (chronic obstructive pulmonary disease)   . Back pain, chronic   . Anxiety   . Hypertension   . GERD (gastroesophageal reflux disease)   . Shortness of breath     OCCASIONAL  . Arthritis   . Anemia NOV 2013  . Complication of anesthesia 1985    VITALS SIGNS DROPPED AND COULD NOT TAKE PAIN MEDS UNTIL VITALS IMPROVED    Past Surgical History  Procedure Laterality Date  . Hysterotomy    . Tubal ligation    . Abdominal hysterectomy    . Knee arthroscopy    . Total knee arthroplasty  01/24/2012    Procedure: TOTAL KNEE ARTHROPLASTY;  Surgeon: Loanne Drilling, MD;  Location: [facility];  Service: Orthopedics;  Laterality: Right;  . Decompressive lumbar laminectomy level 2  04/20/2012    Procedure: DECOMPRESSIVE LUMBAR LAMINECTOMY LEVEL 2;  Surgeon: Jacki Cones, MD;  Location: [facility];  Service: Orthopedics;  Laterality: Right;  Decompressive Lumbar Laminectomy of the L4 - L5 and L5 - S1 Complete/Laminectomy L5 on the Right (X-Ray)  . Colonoscopy  06/19/2008    RMR: tortuous and elongated colon with scattered left-sided diverticula/colonic mucosa appeared entirely normal    Family History  Problem Relation Age of Onset  . Breast cancer Sister   . Colon cancer Sister     two  deceased, one living and terminal, one current undergoing treatment, ages 36, 50, 59, 40    History   Social History  . Marital Status: Single    Spouse Name: N/A    Number of Children: 3  . Years of Education: N/A   Occupational History  . Not on file.   Social History Main Topics  . Smoking status: Former Smoker -- 2.00 packs/day for 40 years    Quit date: 01/19/1992  . Smokeless tobacco: Never Used  . Alcohol Use: No  . Drug Use: No  . Sexual Activity: No   Other Topics Concern  . Not on file   Social History Narrative  . No narrative on file      ROS:  General: Negative for anorexia, weight loss, fever, chills, fatigue, weakness. Eyes: Negative for vision changes.  ENT: Negative for hoarseness, difficulty swallowing , nasal congestion. CV: Negative for chest pain, angina, palpitations, dyspnea on exertion, peripheral edema.  Respiratory: Negative for dyspnea at rest, dyspnea on exertion, cough, sputum, wheezing.  GI: See history of present illness. GU:  Negative for dysuria, hematuria, urinary incontinence, urinary frequency, nocturnal urination.  MS: Chronic joint pain. Back pain improved since surgery this spring.  Derm: Negative for rash or itching.  Neuro: Negative for weakness, abnormal sensation, seizure, frequent headaches, memory loss, confusion.  Psych: Negative for anxiety, depression, suicidal ideation, hallucinations.  Endo: Negative for unusual weight change.  Heme: Negative for bruising or bleeding. Allergy: Negative for rash or hives.    Physical Examination:  BP 122/73  Pulse 95  Temp(Src) 97.9 F (36.6 C) (Oral)  Ht 5' (1.524 m)  Wt 194 lb 9.6 oz (88.27 kg)  BMI 38.01 kg/m2   General: Well-nourished, well-developed in no acute distress.  Head: Normocephalic, atraumatic.   Eyes: Conjunctiva pink, no icterus. Mouth: Oropharyngeal mucosa moist and pink , no lesions erythema or exudate. Neck: Supple without thyromegaly, masses, or  lymphadenopathy.  Lungs: Clear to auscultation bilaterally.  Heart: Regular rate and rhythm, no murmurs rubs or gallops.  Abdomen: Bowel sounds are normal, nontender, nondistended, no hepatosplenomegaly or masses, no abdominal bruits or    hernia , no rebound or guarding.   Rectal: not peformed Extremities: No lower extremity edema. No clubbing or deformities.  Neuro: Alert and oriented x 4 , grossly normal neurologically.  Skin: Warm and dry, no rash or jaundice.   Psych: Alert and cooperative, normal mood and affect.

## 2012-11-01 NOTE — Assessment & Plan Note (Addendum)
72 year old lady who presents to consider surveillance colonoscopy given significant family history of 4 sisters with colon cancer. One before the age of 44. Patient's last colonoscopy in April 2010. No polyps. She also has chronic microcytic anemia with low iron, saturations, low normal ferritin. Unclear to me how long she has had this but it looks like dates back for more than 2 years. Does not appear to be secondary to surgeries within the past one year. She is on chronic prednisone and methotrexate. Recently had labs with her rheumatologist, Dr. Dareen Piano, we have requested these records.  We will plan on colonoscopy in the near future. I'll discuss further with Dr. Jena Gauss regarding antibiotic prophylaxis given joint replacement within the last 12 months. Generally, based on ASGE guidelines, no antibiotics are needed however given her immunosuppression therapy, I will discuss this with him.  Review labs from Dr. Dareen Piano as available.

## 2012-11-01 NOTE — Patient Instructions (Addendum)
1. We have requested records from Dr. Dareen Piano for review. 2. Colonoscopy as scheduled with Dr. Jena Gauss.

## 2012-11-02 NOTE — Progress Notes (Signed)
cc'd to pcp 

## 2012-11-06 ENCOUNTER — Encounter (HOSPITAL_COMMUNITY): Payer: Self-pay | Admitting: Pharmacy Technician

## 2012-11-13 DIAGNOSIS — D369 Benign neoplasm, unspecified site: Secondary | ICD-10-CM

## 2012-11-13 HISTORY — DX: Benign neoplasm, unspecified site: D36.9

## 2012-11-17 NOTE — Progress Notes (Addendum)
Labs 10/2012: WBC 9000 H/H 11/34.7 MCV 78.7 Plt 318000 Tbili 0.5 alkphos 77 AST 33 ALT 33 A.b 4.3 Cre 1.16

## 2012-11-20 ENCOUNTER — Encounter (HOSPITAL_COMMUNITY)
Admission: RE | Disposition: A | Discharge status: Home / Self Care | Payer: Self-pay | Source: Ambulatory Visit | Attending: Internal Medicine

## 2012-11-20 ENCOUNTER — Ambulatory Visit (HOSPITAL_COMMUNITY)
Admission: RE | Admit: 2012-11-20 | Discharge: 2012-11-20 | Disposition: A | Discharge status: Home / Self Care | Payer: Medicare Other | Source: Ambulatory Visit | Attending: Internal Medicine | Admitting: Internal Medicine

## 2012-11-20 ENCOUNTER — Encounter (HOSPITAL_COMMUNITY): Payer: Self-pay | Admitting: *Deleted

## 2012-11-20 DIAGNOSIS — D509 Iron deficiency anemia, unspecified: Secondary | ICD-10-CM

## 2012-11-20 DIAGNOSIS — Z800 Family history of malignant neoplasm of digestive organs: Secondary | ICD-10-CM | POA: Insufficient documentation

## 2012-11-20 DIAGNOSIS — J4489 Other specified chronic obstructive pulmonary disease: Secondary | ICD-10-CM | POA: Insufficient documentation

## 2012-11-20 DIAGNOSIS — I10 Essential (primary) hypertension: Secondary | ICD-10-CM | POA: Insufficient documentation

## 2012-11-20 DIAGNOSIS — D126 Benign neoplasm of colon, unspecified: Secondary | ICD-10-CM

## 2012-11-20 DIAGNOSIS — K5730 Diverticulosis of large intestine without perforation or abscess without bleeding: Secondary | ICD-10-CM

## 2012-11-20 DIAGNOSIS — J449 Chronic obstructive pulmonary disease, unspecified: Secondary | ICD-10-CM | POA: Insufficient documentation

## 2012-11-20 HISTORY — PX: COLONOSCOPY: SHX5424

## 2012-11-20 LAB — COMPREHENSIVE METABOLIC PANEL
ALT: 33 U/L (ref 7–35)
AST: 25 U/L
BUN: 7 mg/dL (ref 4–21)
Creat: 0.95
Glucose: 98
Potassium: 3.6 mmol/L
Sodium: 135 mmol/L — AB (ref 137–147)
Total Bilirubin: 0.4 mg/dL
Total Bilirubin: 0.5 mg/dL

## 2012-11-20 LAB — CBC
HGB: 11 g/dL
MCV: 78.7 fL
WBC: 9

## 2012-11-20 LAB — CBC WITH DIFFERENTIAL/PLATELET
HCT: 30 %
HGB: 8.8 g/dL
MCV: 73 fL

## 2012-11-20 SURGERY — COLONOSCOPY
Anesthesia: Moderate Sedation

## 2012-11-20 MED ORDER — ONDANSETRON HCL 4 MG/2ML IJ SOLN
INTRAMUSCULAR | Status: AC
  Filled 2012-11-20: qty 2

## 2012-11-20 MED ORDER — ONDANSETRON HCL 4 MG/2ML IJ SOLN
INTRAMUSCULAR | Status: DC | PRN
  Administered 2012-11-20: 4 mg via INTRAVENOUS

## 2012-11-20 MED ORDER — MIDAZOLAM HCL 5 MG/5ML IJ SOLN
INTRAMUSCULAR | Status: DC | PRN
  Administered 2012-11-20 (×2): 1 mg via INTRAVENOUS
  Administered 2012-11-20 (×2): 2 mg via INTRAVENOUS
  Administered 2012-11-20: 1 mg via INTRAVENOUS

## 2012-11-20 MED ORDER — MEPERIDINE HCL 100 MG/ML IJ SOLN
INTRAMUSCULAR | Status: DC | PRN
  Administered 2012-11-20: 50 mg via INTRAVENOUS
  Administered 2012-11-20 (×2): 25 mg via INTRAVENOUS

## 2012-11-20 MED ORDER — SODIUM CHLORIDE 0.9 % IV SOLN
INTRAVENOUS | Status: DC
  Administered 2012-11-20: 09:00:00 via INTRAVENOUS

## 2012-11-20 MED ORDER — MEPERIDINE HCL 100 MG/ML IJ SOLN
INTRAMUSCULAR | Status: AC
  Filled 2012-11-20: qty 1

## 2012-11-20 MED ORDER — MIDAZOLAM HCL 5 MG/5ML IJ SOLN
INTRAMUSCULAR | Status: AC
  Filled 2012-11-20: qty 10

## 2012-11-20 MED ORDER — STERILE WATER FOR IRRIGATION IR SOLN
Status: DC | PRN
  Administered 2012-11-20: 09:00:00

## 2012-11-20 NOTE — Op Note (Signed)
[facility] [address] [place], [address]   COLONOSCOPY PROCEDURE REPORT  PATIENT: Glenda Terry, Glenda Terry  MR#:         [id number] BIRTHDATE: 02/16/1941 , 72  yrs. old GENDER: Female ENDOSCOPIST: R.  Roetta Sessions, MD FACP FACG REFERRED BY:  Catalina Pizza, M.D. PROCEDURE DATE:  11/20/2012 PROCEDURE:     ileocolonoscopy with snare polypectomy  INDICATIONS: Positive family history of colon cancer; microcytic anemia  INFORMED CONSENT:  The risks, benefits, alternatives and imponderables including but not limited to bleeding, perforation as well as the possibility of a missed lesion have been reviewed.  The potential for biopsy, lesion removal, etc. have also been discussed.  Questions have been answered.  All parties agreeable. Please see the history and physical in the medical record for more information.  MEDICATIONS: Versed 7 mg IV and Demerol 100 mg IV in divided doses. Zofran 4 mg IV  DESCRIPTION OF PROCEDURE:  After a digital rectal exam was performed, the EC-[id number] ([id number])  colonoscope was advanced from the anus through the rectum and colon to the area of the cecum, ileocecal valve and appendiceal orifice.  The cecum was deeply intubated.  These structures were well-seen and photographed for the record.  From the level of the cecum and ileocecal valve, the scope was slowly and cautiously withdrawn.  The mucosal surfaces were carefully surveyed utilizing scope tip deflection to facilitate fold flattening as needed.  The scope was pulled down into the rectum where a thorough examination including retroflexion was performed.    FINDINGS:  Adequate preparation. Minimal anal canal hemorrhoids; otherwise, normal rectum. Scattered left-sided and transverse diverticula; (1) 4 mm polyp in the mid ascending segment; otherwise, the remainder of the colonic mucosa appeared normal. The distal 5 cm of terminal ileal mucosa also appeared normal.  THERAPEUTIC /  DIAGNOSTIC MANEUVERS PERFORMED:  The above-mentioned polyp was cold snare removed  COMPLICATIONS: None  CECAL WITHDRAWAL TIME:  8 minutes  IMPRESSION:  Colonic polyp-removed as described above. Colonic diverticulosis.  RECOMMENDATIONS: Followup on pathology.   _______________________________ eSigned:  R. Roetta Sessions, MD FACP [facility] 11/20/2012 9:43 AM   CC:

## 2012-11-20 NOTE — Interval H&P Note (Signed)
History and Physical Interval Note:  11/20/2012 9:09 AM  Glenda Terry  has presented today for surgery, with the diagnosis of family history of colon cancer and microcytic anemia  The various methods of treatment have been discussed with the patient and family. After consideration of risks, benefits and other options for treatment, the patient has consented to  Procedure(s) with comments: COLONOSCOPY (N/A) - 9:30 as a surgical intervention .  The patient's history has been reviewed, patient examined, no change in status, stable for surgery.  I have reviewed the patient's chart and labs.  Questions were answered to the patient's satisfaction.     Glenda Terry  No change. Colonoscopy per plan.

## 2012-11-20 NOTE — H&P (View-Only) (Signed)
Primary Care Physician:  HALL, ZACH, MD  Primary Gastroenterologist:  Michael Rourk, MD   Chief Complaint  Patient presents with  . Colonoscopy    HPI:  Glenda Terry is a 72 y.o. female here to consider pursuing surveillance colonoscopy. Last colonoscopy in April 2010. No history of colon polyps. Family history strongly positive for colon cancer. 2 sisters have heart he succumbed to this disease, one currently undergoing treatment and another currently with stage IV disease and terminal.  Patient denies any constipation, diarrhea, melena, rectal bleeding, abdominal pain, vomiting, heartburn, dysphagia, weight loss.  Labs from March 2014 showed a low iron of 37, TIBC 401 iron saturation is low at 9%, ferritin low normal at 26, hemoglobin 8.8, hematocrit 29.8, MCV 73, platelets 386,000, BUN 7, creatinine 0.95, total bilirubin 0.4, alkaline phosphatase 89, AST 18, ALT 9, albumin 3.7.  She is on methotrexate and prednisone chronically for inflammatory arthritis. Within the past year she had knee replacement and back surgery. Denies transfusions related to her surgeries. The last normal hemoglobin that I have available is from January 2011 at 13.6.  Current Outpatient Prescriptions  Medication Sig Dispense Refill  . albuterol (PROAIR HFA) 108 (90 BASE) MCG/ACT inhaler Inhale 2 puffs into the lungs every 6 (six) hours as needed. For COPD      . ALPRAZolam (XANAX) 0.5 MG tablet Take 0.5 mg by mouth every 8 (eight) hours as needed. For anxiety      . beclomethasone (QVAR) 80 MCG/ACT inhaler Inhale 2 puffs into the lungs 2 (two) times daily.       . diphenhydrAMINE (BENADRYL) 25 MG tablet Take 25 mg by mouth at bedtime as needed. For allergies      . EPINEPHrine (EPIPEN 2-PAK) 0.3 mg/0.3 mL DEVI Inject 0.3 mg into the muscle once. As needed for tree nut allergy      . esomeprazole (NEXIUM) 40 MG capsule Take 40 mg by mouth daily before breakfast.       . folic acid (FOLVITE) 1 MG tablet Take  1 mg by mouth daily.      . methotrexate 25 MG/ML injection 40 mg/m2 once a week.       . predniSONE (DELTASONE) 10 MG tablet Take 5 mg by mouth daily.       . telmisartan-hydrochlorothiazide (MICARDIS HCT) 80-12.5 MG per tablet Take 1 tablet by mouth daily.       . traMADol (ULTRAM) 50 MG tablet Take 50 mg by mouth every 6 (six) hours as needed.        No current facility-administered medications for this visit.    Allergies as of 11/01/2012 - Review Complete 11/01/2012  Allergen Reaction Noted  . Flexeril [cyclobenzaprine] Anaphylaxis 01/12/2012  . Other Anaphylaxis 02/08/2011  . Keflex [cephalexin]  05/26/2012  . Statins Other (See Comments) 05/17/2011    Past Medical History  Diagnosis Date  . COPD (chronic obstructive pulmonary disease)   . Back pain, chronic   . Anxiety   . Hypertension   . GERD (gastroesophageal reflux disease)   . Shortness of breath     OCCASIONAL  . Arthritis   . Anemia NOV 2013  . Complication of anesthesia 1985    VITALS SIGNS DROPPED AND COULD NOT TAKE PAIN MEDS UNTIL VITALS IMPROVED    Past Surgical History  Procedure Laterality Date  . Hysterotomy    . Tubal ligation    . Abdominal hysterectomy    . Knee arthroscopy    . Total knee arthroplasty    01/24/2012    Procedure: TOTAL KNEE ARTHROPLASTY;  Surgeon: Frank V Aluisio, MD;  Location: [facility];  Service: Orthopedics;  Laterality: Right;  . Decompressive lumbar laminectomy level 2  04/20/2012    Procedure: DECOMPRESSIVE LUMBAR LAMINECTOMY LEVEL 2;  Surgeon: Ronald A Gioffre, MD;  Location: [facility];  Service: Orthopedics;  Laterality: Right;  Decompressive Lumbar Laminectomy of the L4 - L5 and L5 - S1 Complete/Laminectomy L5 on the Right (X-Ray)  . Colonoscopy  06/19/2008    RMR: tortuous and elongated colon with scattered left-sided diverticula/colonic mucosa appeared entirely normal    Family History  Problem Relation Age of Onset  . Breast cancer Sister   . Colon cancer Sister     two  deceased, one living and terminal, one current undergoing treatment, ages 58, 67, 71, 76    History   Social History  . Marital Status: Single    Spouse Name: N/A    Number of Children: 3  . Years of Education: N/A   Occupational History  . Not on file.   Social History Main Topics  . Smoking status: Former Smoker -- 2.00 packs/day for 40 years    Quit date: 01/19/1992  . Smokeless tobacco: Never Used  . Alcohol Use: No  . Drug Use: No  . Sexual Activity: No   Other Topics Concern  . Not on file   Social History Narrative  . No narrative on file      ROS:  General: Negative for anorexia, weight loss, fever, chills, fatigue, weakness. Eyes: Negative for vision changes.  ENT: Negative for hoarseness, difficulty swallowing , nasal congestion. CV: Negative for chest pain, angina, palpitations, dyspnea on exertion, peripheral edema.  Respiratory: Negative for dyspnea at rest, dyspnea on exertion, cough, sputum, wheezing.  GI: See history of present illness. GU:  Negative for dysuria, hematuria, urinary incontinence, urinary frequency, nocturnal urination.  MS: Chronic joint pain. Back pain improved since surgery this spring.  Derm: Negative for rash or itching.  Neuro: Negative for weakness, abnormal sensation, seizure, frequent headaches, memory loss, confusion.  Psych: Negative for anxiety, depression, suicidal ideation, hallucinations.  Endo: Negative for unusual weight change.  Heme: Negative for bruising or bleeding. Allergy: Negative for rash or hives.    Physical Examination:  BP 122/73  Pulse 95  Temp(Src) 97.9 F (36.6 C) (Oral)  Ht 5' (1.524 m)  Wt 194 lb 9.6 oz (88.27 kg)  BMI 38.01 kg/m2   General: Well-nourished, well-developed in no acute distress.  Head: Normocephalic, atraumatic.   Eyes: Conjunctiva pink, no icterus. Mouth: Oropharyngeal mucosa moist and pink , no lesions erythema or exudate. Neck: Supple without thyromegaly, masses, or  lymphadenopathy.  Lungs: Clear to auscultation bilaterally.  Heart: Regular rate and rhythm, no murmurs rubs or gallops.  Abdomen: Bowel sounds are normal, nontender, nondistended, no hepatosplenomegaly or masses, no abdominal bruits or    hernia , no rebound or guarding.   Rectal: not peformed Extremities: No lower extremity edema. No clubbing or deformities.  Neuro: Alert and oriented x 4 , grossly normal neurologically.  Skin: Warm and dry, no rash or jaundice.   Psych: Alert and cooperative, normal mood and affect.   

## 2012-11-22 ENCOUNTER — Encounter (HOSPITAL_COMMUNITY): Payer: Self-pay | Admitting: Internal Medicine

## 2012-11-25 ENCOUNTER — Encounter: Payer: Self-pay | Admitting: Internal Medicine

## 2012-11-30 ENCOUNTER — Ambulatory Visit (INDEPENDENT_AMBULATORY_CARE_PROVIDER_SITE_OTHER): Payer: Medicare Other | Admitting: Otolaryngology

## 2012-11-30 DIAGNOSIS — H903 Sensorineural hearing loss, bilateral: Secondary | ICD-10-CM

## 2013-02-14 ENCOUNTER — Other Ambulatory Visit (HOSPITAL_COMMUNITY): Payer: Self-pay | Admitting: Internal Medicine

## 2013-02-14 DIAGNOSIS — Z139 Encounter for screening, unspecified: Secondary | ICD-10-CM

## 2013-02-23 ENCOUNTER — Emergency Department (HOSPITAL_COMMUNITY)
Admission: EM | Admit: 2013-02-23 | Discharge: 2013-02-24 | Disposition: A | Discharge status: Home / Self Care | Payer: Medicare Other | Attending: Emergency Medicine | Admitting: Emergency Medicine

## 2013-02-23 ENCOUNTER — Encounter (HOSPITAL_COMMUNITY): Payer: Self-pay | Admitting: Emergency Medicine

## 2013-02-23 DIAGNOSIS — F411 Generalized anxiety disorder: Secondary | ICD-10-CM | POA: Insufficient documentation

## 2013-02-23 DIAGNOSIS — R21 Rash and other nonspecific skin eruption: Secondary | ICD-10-CM | POA: Insufficient documentation

## 2013-02-23 DIAGNOSIS — K219 Gastro-esophageal reflux disease without esophagitis: Secondary | ICD-10-CM | POA: Insufficient documentation

## 2013-02-23 DIAGNOSIS — Z87891 Personal history of nicotine dependence: Secondary | ICD-10-CM | POA: Insufficient documentation

## 2013-02-23 DIAGNOSIS — R109 Unspecified abdominal pain: Secondary | ICD-10-CM

## 2013-02-23 DIAGNOSIS — J4489 Other specified chronic obstructive pulmonary disease: Secondary | ICD-10-CM | POA: Insufficient documentation

## 2013-02-23 DIAGNOSIS — J449 Chronic obstructive pulmonary disease, unspecified: Secondary | ICD-10-CM | POA: Insufficient documentation

## 2013-02-23 DIAGNOSIS — M129 Arthropathy, unspecified: Secondary | ICD-10-CM | POA: Insufficient documentation

## 2013-02-23 DIAGNOSIS — R300 Dysuria: Secondary | ICD-10-CM | POA: Insufficient documentation

## 2013-02-23 DIAGNOSIS — R350 Frequency of micturition: Secondary | ICD-10-CM | POA: Insufficient documentation

## 2013-02-23 DIAGNOSIS — Z9851 Tubal ligation status: Secondary | ICD-10-CM | POA: Insufficient documentation

## 2013-02-23 DIAGNOSIS — IMO0002 Reserved for concepts with insufficient information to code with codable children: Secondary | ICD-10-CM | POA: Insufficient documentation

## 2013-02-23 DIAGNOSIS — I10 Essential (primary) hypertension: Secondary | ICD-10-CM | POA: Insufficient documentation

## 2013-02-23 DIAGNOSIS — Z90710 Acquired absence of both cervix and uterus: Secondary | ICD-10-CM | POA: Insufficient documentation

## 2013-02-23 DIAGNOSIS — Z79899 Other long term (current) drug therapy: Secondary | ICD-10-CM | POA: Insufficient documentation

## 2013-02-23 DIAGNOSIS — Z862 Personal history of diseases of the blood and blood-forming organs and certain disorders involving the immune mechanism: Secondary | ICD-10-CM | POA: Insufficient documentation

## 2013-02-23 DIAGNOSIS — G8929 Other chronic pain: Secondary | ICD-10-CM | POA: Insufficient documentation

## 2013-02-23 DIAGNOSIS — R1084 Generalized abdominal pain: Secondary | ICD-10-CM | POA: Insufficient documentation

## 2013-02-23 LAB — URINALYSIS, ROUTINE W REFLEX MICROSCOPIC
Nitrite: NEGATIVE
Specific Gravity, Urine: 1.025 (ref 1.005–1.030)
pH: 6 (ref 5.0–8.0)

## 2013-02-23 MED ORDER — TRAMADOL HCL 50 MG PO TABS
50.0000 mg | ORAL_TABLET | Freq: Once | ORAL | Status: AC
  Administered 2013-02-23: 50 mg via ORAL
  Filled 2013-02-23: qty 1

## 2013-02-23 NOTE — ED Notes (Signed)
Patient c/o pelvic pain; states also having dysuria.  Patient states pain started this morning.

## 2013-02-23 NOTE — ED Provider Notes (Signed)
CSN: [id number]     Arrival date & time 02/23/13  2301 History  This chart was scribed for Glenda Roller, MD by Quintella Reichert, ED scribe.  This patient was seen in room APA08/APA08 and the patient's care was started at 11:29 PM.   Chief Complaint  Patient presents with  . Pelvic Pain    The history is provided by the patient. No language interpreter was used.    HPI Comments: [name] is a 72 y.o. female who presents to the Emergency Department complaining of severe, progressively-worsening pelvic pain that began yesterday with associated frequency and dysuria.  Pt describes pain as throbbing and "I feel like my pelvis is broke."  She denies injury.  Pain is worsened by walking.  She is urinating frequency and has some pain on urination.  She has also had a decreased appetite.  She denies CP, leg swelling, upper abdominal pain, sore throat, SOB, visual changes, bowel dysfunction, or blood in stools.  Pt states she has "yeast on my bottom."  She admits to h/o tubal ligation and hysterectomy.  Pt states she cannot take anything with aspirin in it because her GI found an ulcer in her small bowel and told her not to.   Past Medical History  Diagnosis Date  . COPD (chronic obstructive pulmonary disease)   . Back pain, chronic   . Anxiety   . Hypertension   . GERD (gastroesophageal reflux disease)   . Shortness of breath     OCCASIONAL  . Arthritis     inflammatory arthritis  . Anemia NOV 2013  . Complication of anesthesia 1985    VITALS SIGNS DROPPED AND COULD NOT TAKE PAIN MEDS UNTIL VITALS IMPROVED  . Tubular adenoma 11/2012    Past Surgical History  Procedure Laterality Date  . Hysterotomy    . Tubal ligation    . Abdominal hysterectomy    . Knee arthroscopy    . Total knee arthroplasty  01/24/2012    Procedure: TOTAL KNEE ARTHROPLASTY;  Surgeon: Loanne Drilling, MD;  Location: [facility];  Service: Orthopedics;  Laterality: Right;  . Decompressive lumbar laminectomy  level 2  04/20/2012    Procedure: DECOMPRESSIVE LUMBAR LAMINECTOMY LEVEL 2;  Surgeon: Jacki Cones, MD;  Location: [facility];  Service: Orthopedics;  Laterality: Right;  Decompressive Lumbar Laminectomy of the L4 - L5 and L5 - S1 Complete/Laminectomy L5 on the Right (X-Ray)  . Colonoscopy  06/19/2008    RMR: tortuous and elongated colon with scattered left-sided diverticula/colonic mucosa appeared entirely normal. Prior colonic ulcers had resolved.  . Esophagogastroduodenoscopy  12/2007    Dr. 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 Small hiatal hernia. Slightly pale duodenal mucosa (biopsy unremarkable)  . Colonoscopy  12/2007    Dr. Jena Gauss: Scattered diffuse sigmoid diverticula, 2 areas of ulceration at the hepatic flexure. Biopsies unremarkable.  . Colonoscopy N/A 11/20/2012    Procedure: COLONOSCOPY;  Surgeon: Corbin Ade, MD;  Location: [facility];  Service: Endoscopy;  Laterality: N/A;  9:30    Family History  Problem Relation Age of Onset  . Breast cancer Sister   . Colon cancer Sister     two deceased, one living and terminal, one current undergoing treatment, ages 32, 64, 62, 66    History  Substance Use Topics  . Smoking status: Former Smoker -- 2.00 packs/day for 40 years    Quit date: 01/19/1992  . Smokeless tobacco: Never Used  .  Alcohol Use: No    OB History   Grav Para Term Preterm Abortions TAB SAB Ect Mult Living                  Review of Systems  All other systems reviewed and are negative.     Allergies  Flexeril; Other; Keflex; and Statins  Home Medications   Current Outpatient Rx  Name  Route  Sig  Dispense  Refill  . albuterol (PROAIR HFA) 108 (90 BASE) MCG/ACT inhaler   Inhalation   Inhale 2 puffs into the lungs every 6 (six) hours as needed. For COPD         . ALPRAZolam (XANAX) 0.5 MG tablet   Oral   Take 0.5 mg by mouth every 8 (eight) hours as needed. For anxiety         .  beclomethasone (QVAR) 80 MCG/ACT inhaler   Inhalation   Inhale 2 puffs into the lungs 2 (two) times daily.          . clotrimazole (LOTRIMIN) 1 % cream      Apply to affected area 2 times daily   15 g   0   . diphenhydrAMINE (BENADRYL) 25 MG tablet   Oral   Take 25 mg by mouth at bedtime as needed. For allergies         . EPINEPHrine (EPIPEN 2-PAK) 0.3 mg/0.3 mL DEVI   Intramuscular   Inject 0.3 mg into the muscle once. As needed for tree nut allergy         . esomeprazole (NEXIUM) 40 MG capsule   Oral   Take 40 mg by mouth daily before breakfast.          . folic acid (FOLVITE) 1 MG tablet   Oral   Take 1 mg by mouth daily.         Marland Kitchen HYDROcodone-acetaminophen (NORCO/VICODIN) 5-325 MG per tablet   Oral   Take 2 tablets by mouth every 4 (four) hours as needed.   10 tablet   0   . methotrexate 25 MG/ML injection      40 mg/m2 once a week.          . Multiple Vitamin (MULTIVITAMIN) tablet   Oral   Take 1 tablet by mouth daily.         . predniSONE (DELTASONE) 5 MG tablet   Oral   Take 5 mg by mouth daily.         Marland Kitchen telmisartan-hydrochlorothiazide (MICARDIS HCT) 80-12.5 MG per tablet   Oral   Take 1 tablet by mouth daily.          . traMADol (ULTRAM) 50 MG tablet   Oral   Take 50 mg by mouth every 6 (six) hours as needed.           BP 129/106  Pulse 122  Temp(Src) 97.9 F (36.6 C) (Oral)  Resp 20  Ht 5\' 5"  (1.651 m)  Wt 185 lb (83.915 kg)  BMI 30.79 kg/m2  SpO2 99%  Physical Exam  Nursing note and vitals reviewed. Constitutional: She is oriented to person, place, and time. She appears well-developed and well-nourished. No distress.  HENT:  Head: Normocephalic and atraumatic.  Mouth/Throat: Oropharynx is clear and moist.  Eyes: Conjunctivae are normal. Pupils are equal, round, and reactive to light. No scleral icterus.  Neck: Neck supple.  Cardiovascular: Normal rate, regular rhythm, normal heart sounds and intact distal pulses.    No murmur heard. Pulmonary/Chest:  Effort normal and breath sounds normal. No stridor. No respiratory distress. She has no rales.  Abdominal: Soft. Bowel sounds are normal. She exhibits no distension. There is tenderness.  Obese, diffusely soft, minimal tenderness with no guarding or masses in bilateral lower and suprapubic abdomen.  Musculoskeletal: Normal range of motion.  Neurological: She is alert and oriented to person, place, and time.  Skin: Skin is warm and dry. Rash ( erythematous rash in the gluteal crease) noted.  Psychiatric: She has a normal mood and affect. Her behavior is normal.    ED Course  Procedures (including critical care time)  DIAGNOSTIC STUDIES: Oxygen Saturation is 99% on room air, normal by my interpretation.    COORDINATION OF CARE: 11:33 PM-Discussed treatment plan which includes pain medication and UA with pt at bedside and pt agreed to plan.    Labs Review Labs Reviewed  URINALYSIS, ROUTINE W REFLEX MICROSCOPIC - Abnormal; Notable for the following:    Bilirubin Urine SMALL (*)    Ketones, ur TRACE (*)    All other components within normal limits  CBC WITH DIFFERENTIAL - Abnormal; Notable for the following:    Hemoglobin 11.4 (*)    MCH 25.3 (*)    All other components within normal limits  BASIC METABOLIC PANEL - Abnormal; Notable for the following:    Glucose, Bld 113 (*)    Creatinine, Ser 1.21 (*)    GFR calc non Af Amer 44 (*)    GFR calc Af Amer 51 (*)    All other components within normal limits   Imaging Review Ct Abdomen Pelvis W Contrast  02/24/2013   CLINICAL DATA:  Pelvic pain and dysuria.  EXAM: CT ABDOMEN AND PELVIS WITH CONTRAST  TECHNIQUE: Multidetector CT imaging of the abdomen and pelvis was performed using the standard protocol following bolus administration of intravenous contrast.  CONTRAST:  50mL OMNIPAQUE IOHEXOL 300 MG/ML SOLN, OMNIPAQUE IOHEXOL 300 MG/ML SOLN  COMPARISON:  CT of the abdomen and pelvis from  05/14/2012  FINDINGS: The visualized lung bases are clear.  A 1.7 cm hypodensity within the posterior right hepatic lobe has not changed significantly from the prior studies and is likely benign. The liver is otherwise unremarkable in appearance. The spleen is within normal limits. The gallbladder is within normal limits. The pancreas and adrenal glands are unremarkable.  The kidneys are unremarkable in appearance. There is no evidence of hydronephrosis. No renal or ureteral stones are seen. No perinephric stranding is appreciated.  No free fluid is identified. The small bowel is unremarkable in appearance. The stomach is within normal limits. No acute vascular abnormalities are seen. Mild scattered calcification is noted along the abdominal aorta and branches.  The appendix is normal in caliber and contains air, without evidence for appendicitis. Minimal diverticulosis is noted at the distal descending and proximal sigmoid colon, without evidence of diverticulitis.  The bladder is relatively decompressed and grossly unremarkable in appearance. The patient is status post hysterectomy. No suspicious adnexal masses are seen. No inguinal lymphadenopathy is seen.  No acute osseous abnormalities are identified.  IMPRESSION: 1. No acute abnormality seen within the abdomen or pelvis. 2. Minimal diverticulosis at the distal descending and proximal sigmoid colon, without evidence of diverticulitis. 3. Mild scattered calcification along the abdominal aorta and its branches.   Electronically Signed   By: Roanna Raider M.D.   On: 02/24/2013 01:49    EKG Interpretation   None       MDM   1.  Abdominal pain    Pt has improved pain - she has decareased heart rate and has normal VS including no fever or hypotension.  CT shows no acute findings, labs are normal including urinalysis.  No definite etiology of the pt's pain is evident on exam or w/u - stable for d/c - pt comfortable going home and f/u if pain continues.     Meds given in ED:  Medications  traMADol (ULTRAM) tablet 50 mg (50 mg Oral Given 02/23/13 2343)  morphine 4 MG/ML injection 4 mg (4 mg Intravenous Given 02/24/13 0052)  0.9 %  sodium chloride infusion (1,000 mLs Intravenous New Bag/Given 02/24/13 0048)  ondansetron (ZOFRAN) injection 4 mg (4 mg Intravenous Given 02/24/13 0049)  iohexol (OMNIPAQUE) 300 MG/ML solution 50 mL (50 mLs Oral Contrast Given 02/24/13 0124)  iohexol (OMNIPAQUE) 300 MG/ML solution 100 mL (100 mLs Intravenous Contrast Given 02/24/13 0124)    New Prescriptions   CLOTRIMAZOLE (LOTRIMIN) 1 % CREAM    Apply to affected area 2 times daily   HYDROCODONE-ACETAMINOPHEN (NORCO/VICODIN) 5-325 MG PER TABLET    Take 2 tablets by mouth every 4 (four) hours as needed.     I personally performed the services described in this documentation, which was scribed in my presence. The recorded information has been reviewed and is accurate.      Glenda Roller, MD 02/24/13 [phone]

## 2013-02-24 ENCOUNTER — Emergency Department (HOSPITAL_COMMUNITY): Payer: Medicare Other

## 2013-02-24 LAB — CBC WITH DIFFERENTIAL/PLATELET
Eosinophils Absolute: 0.3 10*3/uL (ref 0.0–0.7)
Hemoglobin: 11.4 g/dL — ABNORMAL LOW (ref 12.0–15.0)
Lymphocytes Relative: 40 % (ref 12–46)
Lymphs Abs: 2.8 10*3/uL (ref 0.7–4.0)
MCH: 25.3 pg — ABNORMAL LOW (ref 26.0–34.0)
Monocytes Relative: 8 % (ref 3–12)
Neutro Abs: 3.3 10*3/uL (ref 1.7–7.7)
Neutrophils Relative %: 48 % (ref 43–77)
RBC: 4.51 MIL/uL (ref 3.87–5.11)

## 2013-02-24 LAB — BASIC METABOLIC PANEL
BUN: 17 mg/dL (ref 6–23)
CO2: 27 mEq/L (ref 19–32)
Chloride: 96 mEq/L (ref 96–112)
Glucose, Bld: 113 mg/dL — ABNORMAL HIGH (ref 70–99)
Potassium: 3.5 mEq/L (ref 3.5–5.1)

## 2013-02-24 MED ORDER — MORPHINE SULFATE 4 MG/ML IJ SOLN
4.0000 mg | Freq: Once | INTRAMUSCULAR | Status: AC
  Administered 2013-02-24: 4 mg via INTRAVENOUS
  Filled 2013-02-24: qty 1

## 2013-02-24 MED ORDER — SODIUM CHLORIDE 0.9 % IV SOLN
Freq: Once | INTRAVENOUS | Status: AC
  Administered 2013-02-24: 1000 mL via INTRAVENOUS

## 2013-02-24 MED ORDER — IOHEXOL 300 MG/ML  SOLN
100.0000 mL | Freq: Once | INTRAMUSCULAR | Status: AC | PRN
  Administered 2013-02-24: 100 mL via INTRAVENOUS

## 2013-02-24 MED ORDER — HYDROCODONE-ACETAMINOPHEN 5-325 MG PO TABS: 2.0000 | ORAL_TABLET | ORAL | Status: DC | PRN

## 2013-02-24 MED ORDER — IOHEXOL 300 MG/ML  SOLN
50.0000 mL | Freq: Once | INTRAMUSCULAR | Status: AC | PRN
  Administered 2013-02-24: 50 mL via ORAL

## 2013-02-24 MED ORDER — CLOTRIMAZOLE 1 % EX CREA: TOPICAL_CREAM | CUTANEOUS | Status: DC

## 2013-02-24 MED ORDER — ONDANSETRON HCL 4 MG/2ML IJ SOLN
4.0000 mg | Freq: Once | INTRAMUSCULAR | Status: AC
  Administered 2013-02-24: 4 mg via INTRAVENOUS
  Filled 2013-02-24: qty 2

## 2013-02-24 NOTE — ED Notes (Signed)
Pt alert & oriented x4, stable gait. Patient  given discharge instructions, paperwork & prescription(s). Patient verbalized understanding. Pt left department w/ no further questions. 

## 2013-03-19 ENCOUNTER — Ambulatory Visit (HOSPITAL_COMMUNITY)
Admission: RE | Admit: 2013-03-19 | Discharge: 2013-03-19 | Disposition: A | Discharge status: Home / Self Care | Payer: Medicare Other | Source: Ambulatory Visit | Attending: Internal Medicine | Admitting: Internal Medicine

## 2013-03-19 DIAGNOSIS — Z1231 Encounter for screening mammogram for malignant neoplasm of breast: Secondary | ICD-10-CM | POA: Insufficient documentation

## 2013-03-19 DIAGNOSIS — Z139 Encounter for screening, unspecified: Secondary | ICD-10-CM

## 2013-05-31 ENCOUNTER — Encounter: Payer: Self-pay | Admitting: *Deleted

## 2013-06-28 ENCOUNTER — Ambulatory Visit: Payer: Medicare Other | Admitting: Gastroenterology

## 2013-07-17 ENCOUNTER — Ambulatory Visit: Payer: Medicare Other | Admitting: Gastroenterology

## 2013-08-16 ENCOUNTER — Ambulatory Visit: Payer: Medicare Other | Admitting: Gastroenterology

## 2013-09-06 ENCOUNTER — Ambulatory Visit: Payer: Medicare Other | Admitting: Gastroenterology

## 2013-10-15 ENCOUNTER — Ambulatory Visit (HOSPITAL_COMMUNITY)
Admission: RE | Admit: 2013-10-15 | Discharge: 2013-10-15 | Disposition: A | Discharge status: Home / Self Care | Payer: Medicare Other | Source: Ambulatory Visit | Attending: Pulmonary Disease | Admitting: Pulmonary Disease

## 2013-10-15 ENCOUNTER — Other Ambulatory Visit (HOSPITAL_COMMUNITY): Payer: Self-pay | Admitting: Pulmonary Disease

## 2013-10-15 DIAGNOSIS — R0989 Other specified symptoms and signs involving the circulatory and respiratory systems: Secondary | ICD-10-CM

## 2013-10-15 DIAGNOSIS — R05 Cough: Secondary | ICD-10-CM

## 2013-10-15 DIAGNOSIS — Z87891 Personal history of nicotine dependence: Secondary | ICD-10-CM | POA: Insufficient documentation

## 2013-10-15 DIAGNOSIS — R0602 Shortness of breath: Secondary | ICD-10-CM | POA: Insufficient documentation

## 2013-10-15 DIAGNOSIS — R059 Cough, unspecified: Secondary | ICD-10-CM | POA: Diagnosis not present

## 2013-11-09 ENCOUNTER — Institutional Professional Consult (permissible substitution): Payer: Medicare Other | Admitting: Internal Medicine

## 2013-12-06 ENCOUNTER — Institutional Professional Consult (permissible substitution): Payer: Medicare Other | Admitting: Internal Medicine

## 2014-03-02 ENCOUNTER — Encounter (HOSPITAL_COMMUNITY): Payer: Self-pay | Admitting: Emergency Medicine

## 2014-03-02 ENCOUNTER — Emergency Department (HOSPITAL_COMMUNITY)
Admission: EM | Admit: 2014-03-02 | Discharge: 2014-03-02 | Disposition: A | Discharge status: Home / Self Care | Payer: Medicare Other | Attending: Emergency Medicine | Admitting: Emergency Medicine

## 2014-03-02 ENCOUNTER — Emergency Department (HOSPITAL_COMMUNITY): Payer: Medicare Other

## 2014-03-02 DIAGNOSIS — R109 Unspecified abdominal pain: Secondary | ICD-10-CM

## 2014-03-02 DIAGNOSIS — I10 Essential (primary) hypertension: Secondary | ICD-10-CM | POA: Diagnosis not present

## 2014-03-02 DIAGNOSIS — Z7952 Long term (current) use of systemic steroids: Secondary | ICD-10-CM | POA: Insufficient documentation

## 2014-03-02 DIAGNOSIS — Z9089 Acquired absence of other organs: Secondary | ICD-10-CM | POA: Insufficient documentation

## 2014-03-02 DIAGNOSIS — Z79899 Other long term (current) drug therapy: Secondary | ICD-10-CM | POA: Diagnosis not present

## 2014-03-02 DIAGNOSIS — R1031 Right lower quadrant pain: Secondary | ICD-10-CM | POA: Insufficient documentation

## 2014-03-02 DIAGNOSIS — J449 Chronic obstructive pulmonary disease, unspecified: Secondary | ICD-10-CM | POA: Insufficient documentation

## 2014-03-02 DIAGNOSIS — G8929 Other chronic pain: Secondary | ICD-10-CM | POA: Insufficient documentation

## 2014-03-02 DIAGNOSIS — Z87891 Personal history of nicotine dependence: Secondary | ICD-10-CM | POA: Insufficient documentation

## 2014-03-02 DIAGNOSIS — R112 Nausea with vomiting, unspecified: Secondary | ICD-10-CM | POA: Diagnosis not present

## 2014-03-02 DIAGNOSIS — M1990 Unspecified osteoarthritis, unspecified site: Secondary | ICD-10-CM | POA: Diagnosis not present

## 2014-03-02 DIAGNOSIS — Z9851 Tubal ligation status: Secondary | ICD-10-CM | POA: Insufficient documentation

## 2014-03-02 DIAGNOSIS — Z862 Personal history of diseases of the blood and blood-forming organs and certain disorders involving the immune mechanism: Secondary | ICD-10-CM | POA: Diagnosis not present

## 2014-03-02 DIAGNOSIS — F419 Anxiety disorder, unspecified: Secondary | ICD-10-CM | POA: Diagnosis not present

## 2014-03-02 DIAGNOSIS — Z90710 Acquired absence of both cervix and uterus: Secondary | ICD-10-CM | POA: Diagnosis not present

## 2014-03-02 DIAGNOSIS — Z86010 Personal history of colonic polyps: Secondary | ICD-10-CM | POA: Diagnosis not present

## 2014-03-02 LAB — CBC WITH DIFFERENTIAL/PLATELET
BASOS ABS: 0 10*3/uL (ref 0.0–0.1)
Basophils Relative: 0 % (ref 0–1)
Eosinophils Absolute: 0 10*3/uL (ref 0.0–0.7)
Eosinophils Relative: 0 % (ref 0–5)
HCT: 34.9 % — ABNORMAL LOW (ref 36.0–46.0)
Hemoglobin: 11.1 g/dL — ABNORMAL LOW (ref 12.0–15.0)
Lymphocytes Relative: 23 % (ref 12–46)
Lymphs Abs: 2.7 10*3/uL (ref 0.7–4.0)
MCH: 24.9 pg — ABNORMAL LOW (ref 26.0–34.0)
MCHC: 31.8 g/dL (ref 30.0–36.0)
MCV: 78.3 fL (ref 78.0–100.0)
Monocytes Absolute: 0.5 10*3/uL (ref 0.1–1.0)
Monocytes Relative: 4 % (ref 3–12)
NEUTROS ABS: 8.8 10*3/uL — AB (ref 1.7–7.7)
Neutrophils Relative %: 73 % (ref 43–77)
PLATELETS: 281 10*3/uL (ref 150–400)
RBC: 4.46 MIL/uL (ref 3.87–5.11)
RDW: 14.8 % (ref 11.5–15.5)
WBC: 12.1 10*3/uL — AB (ref 4.0–10.5)

## 2014-03-02 LAB — URINALYSIS, ROUTINE W REFLEX MICROSCOPIC
BILIRUBIN URINE: NEGATIVE
GLUCOSE, UA: NEGATIVE mg/dL
Hgb urine dipstick: NEGATIVE
KETONES UR: NEGATIVE mg/dL
Leukocytes, UA: NEGATIVE
Nitrite: NEGATIVE
Protein, ur: NEGATIVE mg/dL
SPECIFIC GRAVITY, URINE: 1.01 (ref 1.005–1.030)
Urobilinogen, UA: 0.2 mg/dL (ref 0.0–1.0)
pH: 7 (ref 5.0–8.0)

## 2014-03-02 LAB — COMPREHENSIVE METABOLIC PANEL
ALBUMIN: 3.7 g/dL (ref 3.5–5.2)
ALT: 38 U/L — ABNORMAL HIGH (ref 0–35)
AST: 22 U/L (ref 0–37)
Alkaline Phosphatase: 79 U/L (ref 39–117)
Anion gap: 12 (ref 5–15)
BILIRUBIN TOTAL: 0.3 mg/dL (ref 0.3–1.2)
BUN: 22 mg/dL (ref 6–23)
CHLORIDE: 97 meq/L (ref 96–112)
CO2: 26 meq/L (ref 19–32)
Calcium: 9.1 mg/dL (ref 8.4–10.5)
Creatinine, Ser: 1.18 mg/dL — ABNORMAL HIGH (ref 0.50–1.10)
GFR calc Af Amer: 52 mL/min — ABNORMAL LOW (ref >90)
GFR, EST NON AFRICAN AMERICAN: 45 mL/min — AB (ref >90)
Glucose, Bld: 111 mg/dL — ABNORMAL HIGH (ref 70–99)
Potassium: 3.8 mEq/L (ref 3.7–5.3)
SODIUM: 135 meq/L — AB (ref 137–147)
Total Protein: 7 g/dL (ref 6.0–8.3)

## 2014-03-02 LAB — LACTIC ACID, PLASMA: Lactic Acid, Venous: 0.8 mmol/L (ref 0.5–2.2)

## 2014-03-02 LAB — LIPASE, BLOOD: Lipase: 16 U/L (ref 11–59)

## 2014-03-02 MED ORDER — SODIUM CHLORIDE 0.9 % IV BOLUS (SEPSIS)
500.0000 mL | Freq: Once | INTRAVENOUS | Status: AC
  Administered 2014-03-02: 500 mL via INTRAVENOUS

## 2014-03-02 MED ORDER — HYDROMORPHONE HCL 1 MG/ML IJ SOLN
0.5000 mg | Freq: Once | INTRAMUSCULAR | Status: AC
  Administered 2014-03-02: 0.5 mg via INTRAVENOUS
  Filled 2014-03-02: qty 1

## 2014-03-02 MED ORDER — TRAMADOL HCL 50 MG PO TABS: 50.0000 mg | ORAL_TABLET | Freq: Four times a day (QID) | ORAL | Status: DC | PRN

## 2014-03-02 MED ORDER — IOHEXOL 300 MG/ML  SOLN
80.0000 mL | Freq: Once | INTRAMUSCULAR | Status: AC | PRN
  Administered 2014-03-02: 80 mL via INTRAVENOUS

## 2014-03-02 MED ORDER — MORPHINE SULFATE 4 MG/ML IJ SOLN
4.0000 mg | INTRAMUSCULAR | Status: AC | PRN
  Administered 2014-03-02 (×2): 4 mg via INTRAVENOUS
  Filled 2014-03-02: qty 1

## 2014-03-02 MED ORDER — ONDANSETRON 4 MG PO TBDP: ORAL_TABLET | ORAL | Status: DC

## 2014-03-02 MED ORDER — ONDANSETRON HCL 4 MG/2ML IJ SOLN
4.0000 mg | Freq: Once | INTRAMUSCULAR | Status: AC
  Administered 2014-03-02: 4 mg via INTRAVENOUS
  Filled 2014-03-02: qty 2

## 2014-03-02 MED ORDER — MORPHINE SULFATE 4 MG/ML IJ SOLN
INTRAMUSCULAR | Status: AC
  Administered 2014-03-02: 4 mg via INTRAVENOUS
  Filled 2014-03-02: qty 1

## 2014-03-02 MED ORDER — IOHEXOL 300 MG/ML  SOLN
50.0000 mL | Freq: Once | INTRAMUSCULAR | Status: AC | PRN
  Administered 2014-03-02: 50 mL via ORAL

## 2014-03-02 NOTE — Discharge Instructions (Signed)
If you were given medicines take as directed.  If you are on coumadin or contraceptives realize their levels and effectiveness is altered by many different medicines.  If you have any reaction (rash, tongues swelling, other) to the medicines stop taking and see a physician.   Please follow up as directed and return to the ER or see a physician for new or worsening symptoms.  Thank you. Filed Vitals:   03/02/14 1042 03/02/14 1419  BP: 168/81 120/48  Pulse: 64 68  Temp: 98.7 F (37.1 C)   TempSrc: Oral   Resp: 27 18  Height: 5' (1.524 m)   Weight: 189 lb (85.73 kg)   SpO2: 100% 96%

## 2014-03-02 NOTE — ED Provider Notes (Signed)
CSN: [id number]     Arrival date & time 03/02/14  1030 History  This chart was scribed for Glenda Clonts, MD, by Stephania Fragmin, ED Scribe. This patient was seen in room APA01/APA01 and the patient's care was started at 11:24 AM.    Chief Complaint  Patient presents with  . Abdominal Pain   Patient is a 73 y.o. female presenting with abdominal pain. The history is provided by the patient. No language interpreter was used.  Abdominal Pain Pain location:  RLQ Pain quality: stabbing   Pain severity:  Severe Onset quality:  Gradual Duration:  2 days Timing:  Constant Progression:  Worsening Chronicity:  New Context: awakening from sleep   Associated symptoms: nausea   Associated symptoms: no dysuria and no vomiting   Risk factors: being elderly     HPI Comments: Glenda Terry is a 73 y.o. female who presents to the Emergency Department complaining of right lower abdominal pain that started about 2 days ago but which worsened this morning, waking her up from her sleep with stabbing pains. Patient states she has a history of tubal ligation. She states she has an allergy to Flexeril and Keflex. Patient doesn't know if she still has her appendix, saying that she is unsure whether it may have been removed during her hysterectomy. She denies flatulence, vomiting, and bloody stools.   Past Medical History  Diagnosis Date  . COPD (chronic obstructive pulmonary disease)   . Back pain, chronic   . Anxiety   . Hypertension   . GERD (gastroesophageal reflux disease)   . Shortness of breath     OCCASIONAL  . Arthritis     inflammatory arthritis  . Anemia NOV 2013  . Complication of anesthesia 1985    VITALS SIGNS DROPPED AND COULD NOT TAKE PAIN MEDS UNTIL VITALS IMPROVED  . Tubular adenoma 11/2012   Past Surgical History  Procedure Laterality Date  . Hysterotomy    . Tubal ligation    . Abdominal hysterectomy    . Knee arthroscopy    . Total knee arthroplasty  01/24/2012     Procedure: TOTAL KNEE ARTHROPLASTY;  Surgeon: Gearlean Alf, MD;  Location: [facility];  Service: Orthopedics;  Laterality: Right;  . Decompressive lumbar laminectomy level 2  04/20/2012    Procedure: DECOMPRESSIVE LUMBAR LAMINECTOMY LEVEL 2;  Surgeon: Tobi Bastos, MD;  Location: [facility];  Service: Orthopedics;  Laterality: Right;  Decompressive Lumbar Laminectomy of the L4 - L5 and L5 - S1 Complete/Laminectomy L5 on the Right (X-Ray)  . Colonoscopy  06/19/2008    RMR: tortuous and elongated colon with scattered left-sided diverticula/colonic mucosa appeared entirely normal. Prior colonic ulcers had resolved.  . Esophagogastroduodenoscopy  12/2007    Dr. 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 Small hiatal hernia. Slightly pale duodenal mucosa (biopsy unremarkable)  . Colonoscopy  12/2007    Dr. Gala Romney: Scattered diffuse sigmoid diverticula, 2 areas of ulceration at the hepatic flexure. Biopsies unremarkable.  . Colonoscopy N/A 11/20/2012    Procedure: COLONOSCOPY;  Surgeon: Daneil Dolin, MD;  Location: [facility];  Service: Endoscopy;  Laterality: N/A;  9:30   Family History  Problem Relation Age of Onset  . Breast cancer Sister   . Colon cancer Sister     two deceased, one living and terminal, one current undergoing treatment, ages 58, 67, 71, 76   History  Substance Use Topics  . Smoking status: Former Smoker -- 2.00  packs/day for 40 years    Quit date: 01/19/1992  . Smokeless tobacco: Never Used  . Alcohol Use: No   OB History    Gravida Para Term Preterm AB TAB SAB Ectopic Multiple Living   3 3 2 1      3      Review of Systems  Gastrointestinal: Positive for nausea and abdominal pain. Negative for vomiting and blood in stool.  Genitourinary: Negative for dysuria, urgency, frequency, decreased urine volume and difficulty urinating.  All other systems reviewed and are negative.     Allergies  Flexeril; Other; Keflex; and  Statins  Home Medications   Prior to Admission medications   Medication Sig Start Date End Date Taking? Authorizing Provider  albuterol (PROAIR HFA) 108 (90 BASE) MCG/ACT inhaler Inhale 2 puffs into the lungs every 6 (six) hours as needed. For COPD   Yes Historical Provider, MD  ALPRAZolam Duanne Moron) 0.5 MG tablet Take 0.5 mg by mouth every 8 (eight) hours as needed. For anxiety   Yes Historical Provider, MD  beclomethasone (QVAR) 80 MCG/ACT inhaler Inhale 2 puffs into the lungs 2 (two) times daily.    Yes Historical Provider, MD  clotrimazole (LOTRIMIN) 1 % cream Apply to affected area 2 times daily Patient taking differently: Apply 1 application topically 2 (two) times daily.  02/24/13  Yes Johnna Acosta, MD  esomeprazole (NEXIUM) 40 MG capsule Take 40 mg by mouth daily before breakfast.    Yes Historical Provider, MD  folic acid (FOLVITE) 1 MG tablet Take 1 mg by mouth daily.   Yes Historical Provider, MD  telmisartan-hydrochlorothiazide (MICARDIS HCT) 80-12.5 MG per tablet Take 1 tablet by mouth daily.  10/09/12  Yes Historical Provider, MD  diphenhydrAMINE (BENADRYL) 25 MG tablet Take 25 mg by mouth at bedtime as needed. For allergies    Historical Provider, MD  EPINEPHrine (EPIPEN 2-PAK) 0.3 mg/0.3 mL DEVI Inject 0.3 mg into the muscle once. As needed for tree nut allergy    Historical Provider, MD  HYDROcodone-acetaminophen (NORCO/VICODIN) 5-325 MG per tablet Take 2 tablets by mouth every 4 (four) hours as needed. Patient not taking: Reported on 03/02/2014 02/24/13   Johnna Acosta, MD  ondansetron (ZOFRAN ODT) 4 MG disintegrating tablet 4mg  ODT q4 hours prn nausea/vomit 03/02/14   Glenda Clonts, MD  traMADol (ULTRAM) 50 MG tablet Take 50 mg by mouth every 6 (six) hours as needed.  04/11/12   Historical Provider, MD  traMADol (ULTRAM) 50 MG tablet Take 1 tablet (50 mg total) by mouth every 6 (six) hours as needed. 03/02/14   Glenda Clonts, MD   BP 120/48 mmHg  Pulse 68  Temp(Src) 98.7  F (37.1 C) (Oral)  Resp 18  Ht 5' (1.524 m)  Wt 189 lb (85.73 kg)  BMI 36.91 kg/m2  SpO2 96% Physical Exam  Constitutional: She is oriented to person, place, and time. She appears well-developed and well-nourished. She appears distressed (patient appears tearful).  HENT:  Head: Normocephalic and atraumatic.  Eyes: Conjunctivae and EOM are normal.  Neck: Neck supple. No tracheal deviation present.  Cardiovascular: Normal rate.   Pulmonary/Chest: Effort normal. No respiratory distress.  Patient has congested cough in the room.  Abdominal:  Focal moderate RLQ TTP, no guarding. Decreased bowel sounds.  Musculoskeletal: Normal range of motion.  Neurological: She is alert and oriented to person, place, and time.  Skin: Skin is warm and dry.  Psychiatric: She has a normal mood and affect. Her behavior is normal.  Nursing note and vitals reviewed.   ED Course  Procedures (including critical care time)  DIAGNOSTIC STUDIES: Oxygen Saturation is 100% on RA, normal by my interpretation.    COORDINATION OF CARE: 11:36 AM - Discussed treatment plan with pt at bedside which includes morphine and a possible CT scan, and pt agreed to plan.   Labs Review Labs Reviewed  CBC WITH DIFFERENTIAL - Abnormal; Notable for the following:    WBC 12.1 (*)    Hemoglobin 11.1 (*)    HCT 34.9 (*)    MCH 24.9 (*)    Neutro Abs 8.8 (*)    All other components within normal limits  COMPREHENSIVE METABOLIC PANEL - Abnormal; Notable for the following:    Sodium 135 (*)    Glucose, Bld 111 (*)    Creatinine, Ser 1.18 (*)    ALT 38 (*)    GFR calc non Af Amer 45 (*)    GFR calc Af Amer 52 (*)    All other components within normal limits  LIPASE, BLOOD  URINALYSIS, ROUTINE W REFLEX MICROSCOPIC  LACTIC ACID, PLASMA    Imaging Review Ct Abdomen Pelvis W Contrast  03/02/2014   CLINICAL DATA:  Right lower abdominal pain that started 2 days ago which worsened this morning.  EXAM: CT ABDOMEN AND  PELVIS WITH CONTRAST  TECHNIQUE: Multidetector CT imaging of the abdomen and pelvis was performed using the standard protocol following bolus administration of intravenous contrast.  CONTRAST:  52mL OMNIPAQUE IOHEXOL 300 MG/ML SOLN, 74mL OMNIPAQUE IOHEXOL 300 MG/ML SOLN  COMPARISON:  02/24/2013  FINDINGS: The lung bases are clear.  The liver is diffusely low in attenuation likely secondary to hepatic steatosis. The previously seen hepatic mass in the posterior right hepatic lobe is not well delineated on the current exam. There is no intrahepatic or extrahepatic biliary ductal dilatation. The gallbladder is normal. The spleen demonstrates no focal abnormality. The kidneys, adrenal glands and pancreas are normal. The bladder is unremarkable.  There is a small hiatal hernia. The stomach, duodenum, small intestine, and large intestine demonstrate no contrast extravasation or dilatation. There is a normal caliber appendix in the right lower quadrant without periappendiceal inflammatory changes.There is a small fat containing umbilical hernia. There is no pneumoperitoneum, pneumatosis, or portal venous gas. There is no abdominal or pelvic free fluid. There is no lymphadenopathy.  The abdominal aorta is normal in caliber with atherosclerosis.  There are no lytic or sclerotic osseous lesions. There is degenerative disc disease at L4-5 and L5-S1. There is bilateral facet arthropathy at L4-5 and L5-S1. There are broad-based disc bulges at L3-4, L4-5 and L5-S1.  IMPRESSION: 1. No acute abdominal or pelvic pathology. 2. Normal appendix.   Electronically Signed   By: Kathreen Devoid   On: 03/02/2014 12:40     EKG Interpretation None      MDM   Final diagnoses:  Acute right flank pain  Non-intractable vomiting with nausea, vomiting of unspecified type  ARF  I personally performed the services described in this documentation, which was scribed in my presence. The recorded information has been reviewed and is  accurate.  Patient presents with moderate right lower quadrant abdominal pain. CT scan results reviewed no acute findings. Patient one episode of vomiting however tolerated oral with pain meds and nausea meds. Patient improved on reassessment comfortable with outpatient follow-up. Blood work including lactate and urine unremarkable.  Results and differential diagnosis were discussed with the patient/parent/guardian. Close follow up outpatient was discussed, comfortable  with the plan.   Medications  morphine 4 MG/ML injection 4 mg (4 mg Intravenous Given 03/02/14 1245)  sodium chloride 0.9 % bolus 500 mL (0 mLs Intravenous Stopped 03/02/14 1238)  iohexol (OMNIPAQUE) 300 MG/ML solution 50 mL (50 mLs Oral Contrast Given 03/02/14 1140)  iohexol (OMNIPAQUE) 300 MG/ML solution 80 mL (80 mLs Intravenous Contrast Given 03/02/14 1220)  HYDROmorphone (DILAUDID) injection 0.5 mg (0.5 mg Intravenous Given 03/02/14 1358)  sodium chloride 0.9 % bolus 500 mL (500 mLs Intravenous New Bag/Given 03/02/14 1357)  ondansetron (ZOFRAN) injection 4 mg (4 mg Intravenous Given 03/02/14 1357)    Filed Vitals:   03/02/14 1042 03/02/14 1419  BP: 168/81 120/48  Pulse: 64 68  Temp: 98.7 F (37.1 C)   TempSrc: Oral   Resp: 27 18  Height: 5' (1.524 m)   Weight: 189 lb (85.73 kg)   SpO2: 100% 96%    Final diagnoses:  Acute right flank pain  Non-intractable vomiting with nausea, vomiting of unspecified type      Glenda Clonts, MD 03/02/14 1459

## 2014-03-02 NOTE — ED Notes (Signed)
Patient c/o right lower abd pain that woke her from her sleep this morning. Patient reports nausea but denies any vomiting, diarrhea, or urinary symptoms. Per patient had 3 BMs this morning with no blood noted.

## 2014-03-02 NOTE — ED Notes (Signed)
Pt refused fluids at this time

## 2014-03-03 ENCOUNTER — Emergency Department (HOSPITAL_COMMUNITY)
Admission: EM | Admit: 2014-03-03 | Discharge: 2014-03-03 | Disposition: A | Discharge status: Home / Self Care | Payer: Medicare Other | Attending: Emergency Medicine | Admitting: Emergency Medicine

## 2014-03-03 ENCOUNTER — Encounter (HOSPITAL_COMMUNITY): Payer: Self-pay | Admitting: Emergency Medicine

## 2014-03-03 DIAGNOSIS — F419 Anxiety disorder, unspecified: Secondary | ICD-10-CM | POA: Diagnosis not present

## 2014-03-03 DIAGNOSIS — Z79899 Other long term (current) drug therapy: Secondary | ICD-10-CM | POA: Insufficient documentation

## 2014-03-03 DIAGNOSIS — K219 Gastro-esophageal reflux disease without esophagitis: Secondary | ICD-10-CM | POA: Insufficient documentation

## 2014-03-03 DIAGNOSIS — I10 Essential (primary) hypertension: Secondary | ICD-10-CM | POA: Insufficient documentation

## 2014-03-03 DIAGNOSIS — G8929 Other chronic pain: Secondary | ICD-10-CM | POA: Diagnosis not present

## 2014-03-03 DIAGNOSIS — R1031 Right lower quadrant pain: Secondary | ICD-10-CM | POA: Diagnosis not present

## 2014-03-03 DIAGNOSIS — Z7951 Long term (current) use of inhaled steroids: Secondary | ICD-10-CM | POA: Diagnosis not present

## 2014-03-03 DIAGNOSIS — R109 Unspecified abdominal pain: Secondary | ICD-10-CM

## 2014-03-03 DIAGNOSIS — M1990 Unspecified osteoarthritis, unspecified site: Secondary | ICD-10-CM | POA: Insufficient documentation

## 2014-03-03 DIAGNOSIS — J449 Chronic obstructive pulmonary disease, unspecified: Secondary | ICD-10-CM | POA: Insufficient documentation

## 2014-03-03 DIAGNOSIS — D649 Anemia, unspecified: Secondary | ICD-10-CM | POA: Diagnosis not present

## 2014-03-03 DIAGNOSIS — Z86010 Personal history of colonic polyps: Secondary | ICD-10-CM | POA: Diagnosis not present

## 2014-03-03 DIAGNOSIS — Z87891 Personal history of nicotine dependence: Secondary | ICD-10-CM | POA: Diagnosis not present

## 2014-03-03 DIAGNOSIS — Z7952 Long term (current) use of systemic steroids: Secondary | ICD-10-CM | POA: Insufficient documentation

## 2014-03-03 LAB — URINE MICROSCOPIC-ADD ON

## 2014-03-03 LAB — URINALYSIS, ROUTINE W REFLEX MICROSCOPIC
Bilirubin Urine: NEGATIVE
Glucose, UA: NEGATIVE mg/dL
Ketones, ur: NEGATIVE mg/dL
LEUKOCYTES UA: NEGATIVE
Nitrite: NEGATIVE
Protein, ur: NEGATIVE mg/dL
SPECIFIC GRAVITY, URINE: 1.015 (ref 1.005–1.030)
UROBILINOGEN UA: 0.2 mg/dL (ref 0.0–1.0)
pH: 6.5 (ref 5.0–8.0)

## 2014-03-03 LAB — I-STAT CHEM 8, ED
BUN: 17 mg/dL (ref 6–23)
CREATININE: 1.2 mg/dL — AB (ref 0.50–1.10)
Calcium, Ion: 1.16 mmol/L (ref 1.13–1.30)
Chloride: 97 mEq/L (ref 96–112)
GLUCOSE: 95 mg/dL (ref 70–99)
HCT: 39 % (ref 36.0–46.0)
Hemoglobin: 13.3 g/dL (ref 12.0–15.0)
POTASSIUM: 3.1 meq/L — AB (ref 3.7–5.3)
Sodium: 137 mEq/L (ref 137–147)
TCO2: 28 mmol/L (ref 0–100)

## 2014-03-03 MED ORDER — HYDROCODONE-ACETAMINOPHEN 5-325 MG PO TABS
1.0000 | ORAL_TABLET | Freq: Once | ORAL | Status: AC
  Administered 2014-03-03: 1 via ORAL
  Filled 2014-03-03: qty 1

## 2014-03-03 MED ORDER — POTASSIUM CHLORIDE CRYS ER 20 MEQ PO TBCR
40.0000 meq | EXTENDED_RELEASE_TABLET | Freq: Once | ORAL | Status: AC
  Administered 2014-03-03: 40 meq via ORAL
  Filled 2014-03-03: qty 2

## 2014-03-03 MED ORDER — HYDROCODONE-ACETAMINOPHEN 5-325 MG PO TABS: ORAL_TABLET | ORAL | Status: DC

## 2014-03-03 NOTE — ED Notes (Signed)
Pt seen here yesterday for abdominal pain, pt was given script for Tramadol. Pt states she has taken all of them and "they are not working." Pt reports RLQ pain.

## 2014-03-03 NOTE — Discharge Instructions (Signed)
°Emergency Department Resource Guide °1) Find a Doctor and Pay Out of Pocket °Although you won't have to find out who is covered by your insurance plan, it is a good idea to ask around and get recommendations. You will then need to call the office and see if the doctor you have chosen will accept you as a new patient and what types of options they offer for patients who are self-pay. Some doctors offer discounts or will set up payment plans for their patients who do not have insurance, but you will need to ask so you aren't surprised when you get to your appointment. ° °2) Contact Your Local Health Department °Not all health departments have doctors that can see patients for sick visits, but many do, so it is worth a call to see if yours does. If you don't know where your local health department is, you can check in your phone book. The CDC also has a tool to help you locate your state's health department, and many state websites also have listings of all of their local health departments. ° °3) Find a Walk-in Clinic °If your illness is not likely to be very severe or complicated, you may want to try a walk in clinic. These are popping up all over the country in pharmacies, drugstores, and shopping centers. They're usually staffed by nurse practitioners or physician assistants that have been trained to treat common illnesses and complaints. They're usually fairly quick and inexpensive. However, if you have serious medical issues or chronic medical problems, these are probably not your best option. ° °No Primary Care Doctor: °- Call [facility] at  832-8000 - they can help you locate a primary care doctor that  accepts your insurance, provides certain services, etc. °- Physician Referral Service- 1-800-533-3463 ° °Chronic Pain Problems: °Organization         Address  Phone   Notes  °[facility]  (336) [phone] Patients need to be referred by their primary care doctor.  ° °Medication  Assistance: °Organization         Address  Phone   Notes  °[place] [facility] [address] °[place], [place] [address] (336) 641-8030 --Must be a resident of [place] °-- Must have NO insurance coverage whatsoever (no Medicaid/ Medicare, etc.) °-- The pt. MUST have a primary care doctor that directs their care regularly and follows them in the community °  °[facility]  (866) 331-1348   °[employer]  (888) 892-1162   ° °Agencies that provide inexpensive medical care: °Organization         Address  Phone   Notes  °[facility]  (336) 832-8035   °[facility]    (336) 832-7272   °[facility] [address] °[place],  [address] (336) 832-4777   °[facility] [address], °[place] (336) 271-4999   °[facility]    (336) 373-0678   °[facility]    (336) 272-1050   °[facility] ° [address], [place] Phone:  (336) 832-4444, Fax:  (336) 832-4440 Hours of Operation:  9 am - 6 pm, M-F.  Also accepts Medicaid/Medicare and self-pay.  °[facility] ° [address], [place] Phone: (336) 832-3150, Fax: (336) [phone]. Hours of Operation:  8:30 am - 5:30 pm, M-F.  Also accepts Medicaid and self-pay.  °[facility] [place] [address], [place] Phone: (336) 878-6027   °[facility] [address], [place], [place] (336)723-1848, Ext. 123 Mondays & Thursdays: 7-9 AM.  First 15 patients are seen on a first come, first serve basis. °  ° °Medicaid-accepting [place] Providers: ° °Organization         Address  Phone   Notes  °[facility] [address], [place] (336) 641-2100 Also accepts self-pay patients.  °[facility] [address], [place] ° (336) 856-9996   °[facility] [address], [place]  (336) 288-8857   °[facility] [address] [place] [address], [place] (336) 299-7000   °Veita Bland [address], [place]  ° (336) 373-1557 Only accepts [place] Access Medicaid patients after they have their name applied to their card.  ° °Self-Pay (no insurance) in [place]: ° °Organization         Address  Phone   Notes  °Sickle Cell Patients, [facility] [address], [place] (336) 832-1970   °[facility] [address], [place] (336) 832-4400   °[facility] ° [address], [place] (336) 992-4800   °[facility]/Dr. Osei-Bonsu ° [address] [place] [address], [place] or [address], [place] (336) [phone] Phone number for both [place] and [place] locations is the same.  °[facility] [address], [place] (336) 299-0000   °[facility] [place] [address] [place] [address], [place] or [address] (336) 852-7530 °(336) 878-2260   °[facility] [address], [place] (336) 350-1642, phone; (336) 294-5005, fax Sees patients 1st and 3rd Saturday of every month.  Must not qualify for public or private insurance (i.e. Medicaid, Medicare, [place] Health Choice, Veterans' Benefits) • Household income should be no more than 200% of the poverty level •The clinic cannot treat you if you are pregnant or think you are pregnant • Sexually transmitted diseases are not treated at the clinic.  ° ° °Dental Care: °Organization         Address  Phone  Notes  °[place] [facility] [address], [place] (336) 641-6152 Accepts children up to age 21 who are enrolled in Medicaid or [place] Health Choice; pregnant women with a Medicaid card; and children who have applied for Medicaid or [place] Health Choice, but were declined, whose parents can pay a reduced fee at time of service.  °[place]  [facility] [place]  [address], [place] (336) 641-7733 Accepts children up to age 21 who are enrolled in Medicaid or [place] Health Choice; pregnant women with a Medicaid card; and children who have applied for Medicaid or [place] Health Choice, but were declined, whose parents can pay a reduced fee at time of service.  °[facility] ° [address],  (336) 641-4533 Patients are seen by appointment only. Walk-ins are not accepted. [facility] will see patients 18 years of age and older. °Monday - Tuesday (8am-5pm) °Most Wednesdays (8:30-5pm) °$30 per visit, cash only  °[facility] ° [address], [place] (336) 641-4533 Patients are seen by appointment only. Walk-ins are not accepted. [facility] will see patients 18 years of age and older. °One   Wednesday Evening (Monthly: Volunteer Based).  $30 per visit, cash only  °[facility]  (919) 537-3737 for adults; Children under age 4, call Graduate Pediatric Dentistry at ([phone]) [phone]. Children aged 4-14, please call ([phone]) [phone] to request a pediatric application. ° Dental services are provided in all areas of dental care including fillings, crowns and bridges, complete and partial dentures, implants, gum treatment, root canals, and extractions. Preventive care is also provided. Treatment is provided to both adults and children. °Patients are selected via a lottery and there is often a waiting list. °  °[facility] [address], °[place] ° (336) 763-8833 [email or web address] °  °[facility] [address], [place], [place] (336)723-1848, Ext. 123 Second and Fourth Thursday of each month, opens at 6:30 AM; Clinic ends at 9 AM.  Patients are seen on a first-come first-served basis, and a limited number are seen during each clinic.  ° °[facility] ° [address], [place], [place] (336) 723-7904    Eligibility Requirements °You must have lived in [place], [place], or [place] counties for at least the last three months. °  You cannot be eligible for state or federal sponsored healthcare insurance, including Veterans Administration, Medicaid, or Medicare. °  You generally cannot be eligible for healthcare insurance through your employer.  °  How to apply: °Eligibility screenings are held every Tuesday and Wednesday afternoon from 1:00 pm until 4:00 pm. You do not need an appointment for the interview!  °[facility] [address], [place], [place] 336-631-2330   °[facility]  336-342-8273   °[facility]  336-703-3100   °[facility]  336-570-6415   ° °Behavioral Health Resources in the Community: °Intensive Outpatient Programs °Organization         Address  Phone  Notes  °[facility] [address], [place], [place] 336-878-6098   °[facility] [address], [place], [place] 336-832-9800   °[facility] [address], [place], [place] ° 336-882-2125   °[facility] [address],  °[place], [place] 1-800-853-5163 or 336-641-4981   °Substance Abuse Resources °Organization         Address  Phone  Notes  °[facility]  336-882-2125   °[facility]  336-784-9470   °[facility]  336-285-9073   °[facility]  336-845-3988   °Residential & Outpatient Substance Abuse Program  1-800-659-3381   °Psychological Services °Organization         Address  Phone  Notes  °[facility]  336- 832-9600   °[facility]  336- 378-7881   °[facility] [address], [place] 1-800-853-5163 or 336-641-4981   ° °Mobile Crisis Teams °Organization         Address  Phone  Notes  °[facility], Mobile Crisis Care Unit  1-877-626-1772   °Assertive °[facility] ° [address]  [place], [place] 336-834-9664   °Sharon DeEsch [address] °[place]  336-554-5454   ° °Self-Help/Support Groups °Organization         Address  Phone             Notes  °[facility] [place] - variety of support groups  336- 373-1402 Call for more information  °Narcotics Anonymous (NA), [facility] [address], °[place]   2 meetings at this location  ° °  Residential Treatment Programs Organization         Address  Phone  Notes  [facility] [address],    [place]  1-8031137614   [facility]  [address] [place] [id number], [place], [place]   [place], [place] [phone] Admissions: 8am-3pm M-F  [facility] [place] [address],    [place], [place] 032-122-4825   [facility] [address] [place], [place], [place]   [facility] [address]  [place], [place]   [facility] - Intensive Outpatient [address], [place], [place]   [facility] ([place] [place]  [place], [place] 1-949 669 2074 or (706)302-8712   [facility] [address] [place], [place] Accepts Medicaid  [facility] [place] [address],  [place] 1-(587)656-8072 Substance Abuse/Addiction Treatment   [facility]         Address  Phone  Notes  [facility]  361-276-5625   Domenic Schwab, PhD [address] [place], [place]   207-493-2347 or 7731371954   [facility] [place], [place] 815-293-9998   [facility] [address] [place], [place] 661-575-8267 Insurance/Medicaid/sponsorship through [facility] [address], [address] [place]                                    [place], [place] [phone] [place] [address], [place] [phone]    Dr. Adele Schilder  [phone]   [facility] Dept. 1) [address] [place] 2) [place] 3)  [address], [place] 843-368-4185 [phone]  (434)[phone]   [place] 754-043-2275 or 548-556-8046 (After Hours)      Take the new prescription as directed.  Apply moist heat or ice to the area(s) of discomfort, for 15 minutes at a time, several times per day for the next few days.  Do not fall asleep on a heating or ice pack.  Call your regular medical doctor on Monday to schedule a follow up appointment in the next 2 days.  Return to the Emergency Department immediately if worsening.

## 2014-03-03 NOTE — ED Provider Notes (Signed)
CSN: [id number]     Arrival date & time 03/03/14  1201 History   First MD Initiated Contact with Patient 03/03/14 1307     Chief Complaint  Patient presents with  . Abdominal Pain      HPI Pt was seen at 1310.  Per pt, c/o gradual onset and persistence of constant RLQ abd "pain" for the past 3 days.  Describes the abd pain as "stabbing," "like I have to pass gas," and worse with palpation of the area.  Denies N/V, no diarrhea, no fevers, no back pain, no rash, no CP/SOB, no black or blood in stools. Pt was thoroughly evaluated in the ED yesterday for this complaint with reassuring labs, Udip, and CT scan. Pt states she came back to the ED today "because I still hurt." Denies any change in her symptoms. States she has been taking tramadol with transient improvement of her pain.     Past Medical History  Diagnosis Date  . COPD (chronic obstructive pulmonary disease)   . Back pain, chronic   . Anxiety   . Hypertension   . GERD (gastroesophageal reflux disease)   . Shortness of breath     OCCASIONAL  . Arthritis     inflammatory arthritis  . Anemia NOV 2013  . Complication of anesthesia 1985    VITALS SIGNS DROPPED AND COULD NOT TAKE PAIN MEDS UNTIL VITALS IMPROVED  . Tubular adenoma 11/2012   Past Surgical History  Procedure Laterality Date  . Hysterotomy    . Tubal ligation    . Abdominal hysterectomy    . Knee arthroscopy    . 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 Decompressive lumbar laminectomy level 2  04/20/2012    Procedure: DECOMPRESSIVE LUMBAR LAMINECTOMY LEVEL 2;  Surgeon: Tobi Bastos, MD;  Location: [facility];  Service: Orthopedics;  Laterality: Right;  Decompressive Lumbar Laminectomy of the L4 - L5 and L5 - S1 Complete/Laminectomy L5 on the Right (X-Ray)  . Colonoscopy  06/19/2008    RMR: tortuous and elongated colon with scattered  left-sided diverticula/colonic mucosa appeared entirely normal. Prior colonic ulcers had resolved.  . Esophagogastroduodenoscopy  12/2007    Dr. 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 Small hiatal hernia. Slightly pale duodenal mucosa (biopsy unremarkable)  . Colonoscopy  12/2007    Dr. Gala Romney: Scattered diffuse sigmoid diverticula, 2 areas of ulceration at the hepatic flexure. Biopsies unremarkable.  . Colonoscopy N/A 11/20/2012    Procedure: COLONOSCOPY;  Surgeon: Daneil Dolin, MD;  Location: [facility];  Service: Endoscopy;  Laterality: N/A;  9:30   Family History  Problem Relation Age of Onset  . Breast cancer Sister   . Colon cancer Sister     two deceased, one living and terminal, one current undergoing treatment, ages 58, 67, 71, 76   History  Substance Use Topics  . Smoking status: Former Smoker -- 2.00 packs/day for 40 years    Quit date: 01/19/1992  . Smokeless tobacco: Never Used  . Alcohol Use: No   OB History    Gravida Para Term Preterm AB TAB SAB Ectopic Multiple Living   3 3 2 1      3      Review of Systems ROS: Statement: All systems negative except as marked or noted in the HPI; Constitutional: Negative for fever and chills. ; ;  Eyes: Negative for eye pain, redness and discharge. ; ; ENMT: Negative for ear pain, hoarseness, nasal congestion, sinus pressure and sore throat. ; ; Cardiovascular: Negative for chest pain, palpitations, diaphoresis, dyspnea and peripheral edema. ; ; Respiratory: Negative for cough, wheezing and stridor. ; ; Gastrointestinal: +abd pain. Negative for nausea, vomiting, diarrhea, blood in stool, hematemesis, jaundice and rectal bleeding. . ; ; Genitourinary: Negative for dysuria, flank pain and hematuria. ; ; Musculoskeletal: Negative for back pain and neck pain. Negative for swelling and trauma.; ; Skin: Negative for pruritus, rash, abrasions, blisters, bruising and skin lesion.; ; Neuro:  Negative for headache, lightheadedness and neck stiffness. Negative for weakness, altered level of consciousness , altered mental status, extremity weakness, paresthesias, involuntary movement, seizure and syncope.      Allergies  Flexeril; Other; Keflex; and Statins  Home Medications   Prior to Admission medications   Medication Sig Start Date End Date Taking? Authorizing Provider  ALPRAZolam Duanne Moron) 0.5 MG tablet Take 0.5 mg by mouth every 8 (eight) hours as needed for anxiety. For anxiety   Yes Historical Provider, MD  beclomethasone (QVAR) 80 MCG/ACT inhaler Inhale 2 puffs into the lungs 2 (two) times daily.    Yes Historical Provider, MD  esomeprazole (NEXIUM) 40 MG capsule Take 40 mg by mouth daily before breakfast.    Yes Historical Provider, MD  folic acid (FOLVITE) 1 MG tablet Take 1 mg by mouth daily.   Yes Historical Provider, MD  oxyCODONE-acetaminophen (PERCOCET/ROXICET) 5-325 MG per tablet Take 1 tablet by mouth every 4 (four) hours as needed (pain).   Yes Historical Provider, MD  telmisartan-hydrochlorothiazide (MICARDIS HCT) 80-12.5 MG per tablet Take 1 tablet by mouth daily.  10/09/12  Yes Historical Provider, MD  traMADol (ULTRAM) 50 MG tablet Take 1 tablet (50 mg total) by mouth every 6 (six) hours as needed. Patient taking differently: Take 50 mg by mouth every 6 (six) hours as needed (pain).  03/02/14  Yes Mariea Clonts, MD  albuterol (PROAIR HFA) 108 (90 BASE) MCG/ACT inhaler Inhale 2 puffs into the lungs every 6 (six) hours as needed for wheezing or shortness of breath. For COPD    Historical Provider, MD  clotrimazole (LOTRIMIN) 1 % cream Apply to affected area 2 times daily Patient taking differently: Apply 1 application topically 2 (two) times daily.  02/24/13   Johnna Acosta, MD  diphenhydrAMINE (BENADRYL) 25 MG tablet Take 25 mg by mouth at bedtime as needed. For allergies    Historical Provider, MD  EPINEPHrine (EPIPEN 2-PAK) 0.3 mg/0.3 mL DEVI Inject 0.3 mg into  the muscle once. As needed for tree nut allergy    Historical Provider, MD  HYDROcodone-acetaminophen (NORCO/VICODIN) 5-325 MG per tablet Take 2 tablets by mouth every 4 (four) hours as needed. Patient not taking: Reported on 03/02/2014 02/24/13   Johnna Acosta, MD  ondansetron (ZOFRAN ODT) 4 MG disintegrating tablet 4mg  ODT q4 hours prn nausea/vomit Patient taking differently: Take 4 mg by mouth every 4 (four) hours as needed for nausea or vomiting.  03/02/14   Mariea Clonts, MD   BP 142/65 mmHg  Pulse 60  Temp(Src) 98.6 F (37 C) (Oral)  Resp 18  Ht 5' (1.524 m)  Wt 190 lb (86.183 kg)  BMI 37.11 kg/m2  SpO2 100% Physical Exam  1315: Physical examination:  Nursing notes reviewed; Vital signs and O2 SAT reviewed;  Constitutional: Well developed, Well nourished, Well hydrated, In no acute distress; Head:  Normocephalic, atraumatic; Eyes: EOMI,  PERRL, No scleral icterus; ENMT: Mouth and pharynx normal, Mucous membranes moist; Neck: Supple, Full range of motion, No lymphadenopathy; Cardiovascular: Regular rate and rhythm, No gallop; Respiratory: Breath sounds clear & equal bilaterally, No wheezes.  Speaking full sentences with ease, Normal respiratory effort/excursion; Chest: Nontender, Movement normal; Abdomen: Soft, +point tender below pannus in superficial tissues of RLQ/pelvic area. No palp masses, no rash, no ecchymosis, no open wounds. Nondistended, Normal bowel sounds; Genitourinary: No CVA tenderness; Extremities: Pulses normal, No tenderness, No edema, No calf edema or asymmetry.; Neuro: AA&Ox3, Major CN grossly intact.  Speech clear. No gross focal motor or sensory deficits in extremities.; Skin: Color normal, Warm, Dry.; Psych:  Anxious.    ED Course  Procedures     EKG Interpretation None      MDM  MDM Reviewed: previous chart, nursing note and vitals Reviewed previous: labs and CT scan     Results for orders placed or performed during the hospital encounter of  03/03/14  Urinalysis, Routine w reflex microscopic  Result Value Ref Range   Color, Urine YELLOW YELLOW   APPearance CLEAR CLEAR   Specific Gravity, Urine 1.015 1.005 - 1.030   pH 6.5 5.0 - 8.0   Glucose, UA NEGATIVE NEGATIVE mg/dL   Hgb urine dipstick TRACE (A) NEGATIVE   Bilirubin Urine NEGATIVE NEGATIVE   Ketones, ur NEGATIVE NEGATIVE mg/dL   Protein, ur NEGATIVE NEGATIVE mg/dL   Urobilinogen, UA 0.2 0.0 - 1.0 mg/dL   Nitrite NEGATIVE NEGATIVE   Leukocytes, UA NEGATIVE NEGATIVE  Urine microscopic-add on  Result Value Ref Range   Squamous Epithelial / LPF RARE RARE   RBC / HPF 0-2 <3 RBC/hpf   Bacteria, UA RARE RARE  I-stat Chem 8, ED  Result Value Ref Range   Sodium 137 137 - 147 mEq/L   Potassium 3.1 (L) 3.7 - 5.3 mEq/L   Chloride 97 96 - 112 mEq/L   BUN 17 6 - 23 mg/dL   Creatinine, Ser 1.20 (H) 0.50 - 1.10 mg/dL   Glucose, Bld 95 70 - 99 mg/dL   Calcium, Ion 1.16 1.13 - 1.30 mmol/L   TCO2 28 0 - 100 mmol/L   Hemoglobin 13.3 12.0 - 15.0 g/dL   HCT 39.0 36.0 - 46.0 %   Ct Abdomen Pelvis W Contrast 03/02/2014   CLINICAL DATA:  Right lower abdominal pain that started 2 days ago which worsened this morning.  EXAM: CT ABDOMEN AND PELVIS WITH CONTRAST  TECHNIQUE: Multidetector CT imaging of the abdomen and pelvis was performed using the standard protocol following bolus administration of intravenous contrast.  CONTRAST:  11mL OMNIPAQUE IOHEXOL 300 MG/ML SOLN, 68mL OMNIPAQUE IOHEXOL 300 MG/ML SOLN  COMPARISON:  02/24/2013  FINDINGS: The lung bases are clear.  The liver is diffusely low in attenuation likely secondary to hepatic steatosis. The previously seen hepatic mass in the posterior right hepatic lobe is not well delineated on the current exam. There is no intrahepatic or extrahepatic biliary ductal dilatation. The gallbladder is normal. The spleen demonstrates no focal abnormality. The kidneys, adrenal glands and pancreas are normal. The bladder is unremarkable.  There is a  small hiatal hernia. The stomach, duodenum, small intestine, and large intestine demonstrate no contrast extravasation or dilatation. There is a normal caliber appendix in the right lower quadrant without periappendiceal inflammatory changes.There is a small fat containing umbilical hernia. There is no pneumoperitoneum, pneumatosis, or portal venous gas. There is no abdominal or pelvic free fluid. There is no lymphadenopathy.  The  abdominal aorta is normal in caliber with atherosclerosis.  There are no lytic or sclerotic osseous lesions. There is degenerative disc disease at L4-5 and L5-S1. There is bilateral facet arthropathy at L4-5 and L5-S1. There are broad-based disc bulges at L3-4, L4-5 and L5-S1.  IMPRESSION: 1. No acute abdominal or pelvic pathology. 2. Normal appendix.   Electronically Signed   By: Kathreen Devoid   On: 03/02/2014 12:40    1320:  I had pt show me exactly where the pain was: pt guided my hand to the superficial SQ tissues of her right lower abd wall. T/C to Rads MD regarding CT scan yesterday: Dr. Posey Pronto re-read his scan, states no changes in soft tissues of RLQ to suggest infection/abscess, no additions to his reading from yesterday. Pt and her family reassured regarding her thorough workup from yesterday. Pt continues to press on the one location in her right lower abd wall superficial SQ tissues and tearfully say "but it hurts, it hurts, here, here!" Pt again reassured regarding her workup yesterday. Will not repeat CT scan. Pt and her family requesting "to try another pain medicine." D/W pt and family regarding pain control vs risk of excessive sedation; both verb understanding. Will dose hydrocodone/APAP while in the ED.  1505:  Pt has tol PO well while in the ED without N/V. Pt has ambulated with her cane per her baseline gait. Workup today reassuring. BUN/Cr per baseline. Pt states she is "better" and is ready to go home now, requesting rx hydrocodone. Dx and testing d/w pt and  family.  Questions answered.  Verb understanding, agreeable to d/c home with outpt f/u.   Francine Graven, DO 03/05/14 1026

## 2014-03-03 NOTE — ED Notes (Signed)
EDP in with pt 

## 2014-03-05 LAB — URINE CULTURE

## 2014-04-05 ENCOUNTER — Institutional Professional Consult (permissible substitution): Payer: Medicare Other | Admitting: Internal Medicine

## 2014-04-15 ENCOUNTER — Institutional Professional Consult (permissible substitution): Payer: Self-pay | Admitting: Internal Medicine

## 2014-04-16 ENCOUNTER — Other Ambulatory Visit (HOSPITAL_COMMUNITY): Payer: Self-pay | Admitting: Internal Medicine

## 2014-04-16 DIAGNOSIS — Z1231 Encounter for screening mammogram for malignant neoplasm of breast: Secondary | ICD-10-CM

## 2014-05-02 ENCOUNTER — Ambulatory Visit (HOSPITAL_COMMUNITY): Payer: Self-pay

## 2014-05-02 ENCOUNTER — Ambulatory Visit (HOSPITAL_COMMUNITY)
Admission: RE | Admit: 2014-05-02 | Discharge: 2014-05-02 | Disposition: A | Discharge status: Home / Self Care | Payer: PPO | Source: Ambulatory Visit | Attending: Internal Medicine | Admitting: Internal Medicine

## 2014-05-02 DIAGNOSIS — Z1231 Encounter for screening mammogram for malignant neoplasm of breast: Secondary | ICD-10-CM | POA: Diagnosis not present

## 2014-05-14 ENCOUNTER — Encounter (HOSPITAL_COMMUNITY)
Admission: RE | Admit: 2014-05-14 | Discharge: 2014-05-14 | Disposition: A | Discharge status: Home / Self Care | Payer: PPO | Source: Ambulatory Visit | Attending: Orthopedic Surgery | Admitting: Orthopedic Surgery

## 2014-05-14 ENCOUNTER — Other Ambulatory Visit (HOSPITAL_COMMUNITY): Payer: Self-pay

## 2014-05-14 ENCOUNTER — Encounter (HOSPITAL_COMMUNITY): Payer: Self-pay

## 2014-05-14 DIAGNOSIS — M19011 Primary osteoarthritis, right shoulder: Secondary | ICD-10-CM | POA: Insufficient documentation

## 2014-05-14 DIAGNOSIS — J449 Chronic obstructive pulmonary disease, unspecified: Secondary | ICD-10-CM | POA: Insufficient documentation

## 2014-05-14 DIAGNOSIS — I10 Essential (primary) hypertension: Secondary | ICD-10-CM | POA: Diagnosis not present

## 2014-05-14 DIAGNOSIS — Z87891 Personal history of nicotine dependence: Secondary | ICD-10-CM | POA: Diagnosis not present

## 2014-05-14 DIAGNOSIS — Z01818 Encounter for other preprocedural examination: Secondary | ICD-10-CM | POA: Diagnosis not present

## 2014-05-14 LAB — BASIC METABOLIC PANEL
Anion gap: 5 (ref 5–15)
BUN: 10 mg/dL (ref 6–23)
CO2: 29 mmol/L (ref 19–32)
Calcium: 9.2 mg/dL (ref 8.4–10.5)
Chloride: 102 mmol/L (ref 96–112)
Creatinine, Ser: 1.08 mg/dL (ref 0.50–1.10)
GFR calc Af Amer: 58 mL/min — ABNORMAL LOW (ref >90)
GFR calc non Af Amer: 50 mL/min — ABNORMAL LOW (ref >90)
Glucose, Bld: 97 mg/dL (ref 70–99)
Potassium: 3.7 mmol/L (ref 3.5–5.1)
Sodium: 136 mmol/L (ref 135–145)

## 2014-05-14 LAB — CBC
HEMATOCRIT: 36.7 % (ref 36.0–46.0)
HEMOGLOBIN: 11.5 g/dL — AB (ref 12.0–15.0)
MCH: 24.8 pg — AB (ref 26.0–34.0)
MCHC: 31.3 g/dL (ref 30.0–36.0)
MCV: 79.3 fL (ref 78.0–100.0)
Platelets: 266 10*3/uL (ref 150–400)
RBC: 4.63 MIL/uL (ref 3.87–5.11)
RDW: 14.8 % (ref 11.5–15.5)
WBC: 7.4 10*3/uL (ref 4.0–10.5)

## 2014-05-14 LAB — SURGICAL PCR SCREEN
MRSA, PCR: NEGATIVE
Staphylococcus aureus: NEGATIVE

## 2014-05-14 NOTE — Pre-Procedure Instructions (Addendum)
Glenda Terry  05/14/2014   Your procedure is scheduled on:  Friday, March 11  Report to [facility] Admitting at 0700 AM.  Call this number if you have problems the morning of surgery: [phone]. For other questions before surgery , call [phone] from Monday- Friday 8 am - 4pm.   Remember:   Do not eat food or drink liquids after midnight. Thursday night   Take these medicines the morning of surgery with A SIP OF WATER: inhalers,xanax, pain med if needed,protonix    STOP all herbel meds, nsaids (aleve,naproxen,advil,ibuprofen) 5 days prior to surgery starting 05/19/14 including vitamins, aspirin, folic acid   Do not wear jewelry, make-up or nail polish.  Do not wear lotions, powders, or perfumes. Do not wear deodorant.  Do not shave 48 hours prior to surgery.    Do not bring valuables to the hospital.  [facility] is not responsible  for any belongings or valuables.               Contacts, dentures or bridgework may not be worn into surgery.  Leave suitcase in the car. After surgery it may be brought to your room.  For patients admitted to the hospital, discharge time is determined by your  treatment team.               Patients discharged the day of surgery will not be allowed to drive  home.  Name and phone number of your driver:      Special Instructions: [place] - Preparing for Surgery  Before surgery, you can play an important role.  Because skin is not sterile, your skin needs to be as free of germs as possible.  You can reduce the number of germs on you skin by washing with CHG (chlorahexidine gluconate) soap before surgery.  CHG is an antiseptic cleaner which kills germs and bonds with the skin to continue killing germs even after washing.  Please DO NOT use if you have an allergy to CHG or antibacterial soaps.  If your skin becomes reddened/irritated stop using the CHG and inform your nurse when you arrive at Short Stay.  Do not shave (including  legs and underarms) for at least 48 hours prior to the first CHG shower.  You may shave your face.  Please follow these instructions carefully:   1.  Shower with CHG Soap the night before surgery and the     morning of Surgery.  2.  If you choose to wash your hair, wash your hair first as usual with your  normal shampoo.  3.  After you shampoo, rinse your hair and body thoroughly to remove the Shampoo.  4.  Use CHG as you would any other liquid soap.  You can apply chg directly  to the skin and wash gently with scrungie or a clean washcloth.  5.  Apply the CHG Soap to your body ONLY FROM THE NECK DOWN.   Do not use on open wounds or open sores.  Avoid contact with your eyes,  ears, mouth and genitals (private parts).  Wash genitals (private parts)  with your normal soap.  6.  Wash thoroughly, paying special attention to the area where your surgery  will be performed.  7.  Thoroughly rinse your body with warm water from the neck down.  8.  DO NOT shower/wash with your normal soap after using and rinsing off   the CHG Soap.  9.  Pat yourself dry with a  clean towel.            10.  Wear clean pajamas.            11.  Place clean sheets on your bed the night of your first shower and do not sleep with pets.  Day of Surgery  Do not apply any lotions/deoderants the morning of surgery.  Please wear clean clothes to the hospital/surgery center.     Please read over the following fact sheets that you were given: pain control, infection control,coughing and deep breathing.

## 2014-05-14 NOTE — Progress Notes (Addendum)
req'd office notes from pcp dr Thedore Mins hall [place] (pulmonary)  Some wheezing at preadmit after drinking cold water. Used inhaler, cleared . No more wheezing. Has occ.

## 2014-05-14 NOTE — H&P (Signed)
Glenda Terry is an 74 y.o. female.    Chief Complaint: right shoulder pain  HPI: Pt is a 74 y.o. female complaining of right shoulder pain for multiple years. Pain had continually increased since the beginning. X-rays in the clinic show end-stage arthritic changes of the right shoulder. Pt has tried various conservative treatments which have failed to alleviate their symptoms, including therapy and injections. Various options are discussed with the patient. Risks, benefits and expectations were discussed with the patient. Patient understand the risks, benefits and expectations and wishes to proceed with surgery.   PCP:  Delphina Cahill, MD  D/C Plans:  Home   PMH: Past Medical History  Diagnosis Date  . COPD (chronic obstructive pulmonary disease)   . Back pain, chronic   . Anxiety   . Hypertension   . GERD (gastroesophageal reflux disease)   . Shortness of breath     OCCASIONAL  . Arthritis     inflammatory arthritis  . Anemia NOV 2013  . Tubular adenoma 11/2012  . Complication of anesthesia 1985    VITALS SIGNS DROPPED AND COULD NOT TAKE PAIN MEDS UNTIL VITALS IMPROVED    PSH: Past Surgical History  Procedure Laterality Date  . Hysterotomy    . Tubal ligation    . Abdominal hysterectomy    . Knee arthroscopy Right   . 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 Decompressive lumbar laminectomy level 2  04/20/2012    Procedure: DECOMPRESSIVE LUMBAR LAMINECTOMY LEVEL 2;  Surgeon: Tobi Bastos, MD;  Location: [facility];  Service: Orthopedics;  Laterality: Right;  Decompressive Lumbar Laminectomy of the L4 - L5 and L5 - S1 Complete/Laminectomy L5 on the Right (X-Ray)  . Colonoscopy  06/19/2008    RMR: tortuous and elongated colon with scattered left-sided diverticula/colonic mucosa appeared entirely normal. Prior colonic ulcers had resolved.  .  Esophagogastroduodenoscopy  12/2007    Dr. 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 Small hiatal hernia. Slightly pale duodenal mucosa (biopsy unremarkable)  . Colonoscopy  12/2007    Dr. Gala Romney: Scattered diffuse sigmoid diverticula, 2 areas of ulceration at the hepatic flexure. Biopsies unremarkable.  . Colonoscopy N/A 11/20/2012    Procedure: COLONOSCOPY;  Surgeon: Daneil Dolin, MD;  Location: [facility];  Service: Endoscopy;  Laterality: N/A;  9:30  . Back surgery    . Carpal tunnel release Right 4/14    Social History:  reports that she quit smoking about 22 years ago. Her smoking use included Cigarettes. She has a 80 pack-year smoking history. She has never used smokeless tobacco. She reports that she does not drink alcohol or use illicit drugs.  Allergies:  Allergies  Allergen Reactions  . Flexeril [Cyclobenzaprine] Anaphylaxis  . Other Anaphylaxis    ALL TREE NUTS  . Keflex [Cephalexin]     ITCH  . Statins Other (See Comments)    is pain and swelling   . Betadine [Povidone Iodine] Rash    Itching,rash    Medications: No current facility-administered medications for this encounter.   Current Outpatient Prescriptions  Medication Sig Dispense Refill  . albuterol (PROAIR HFA) 108 (90 BASE) MCG/ACT inhaler Inhale 2 puffs into the lungs every 6 (six) hours as needed for wheezing or shortness of breath. For COPD    . ALPRAZolam (XANAX) 0.5 MG tablet Take 0.5 mg by  mouth 4 (four) times daily as needed for anxiety. For anxiety    . beclomethasone (QVAR) 40 MCG/ACT inhaler Inhale 2 puffs into the lungs 2 (two) times daily.    . clotrimazole (LOTRIMIN) 1 % cream Apply to affected area 2 times daily (Patient not taking: Reported on 05/09/2014) 15 g 0  . diphenhydrAMINE (BENADRYL) 25 MG tablet Take 25 mg by mouth at bedtime as needed. For allergies    . docusate sodium (COLACE) 100 MG capsule Take 100 mg by mouth daily.    Marland Kitchen  EPINEPHrine (EPIPEN 2-PAK) 0.3 mg/0.3 mL DEVI Inject 0.3 mg into the muscle once. As needed for tree nut allergy    . esomeprazole (NEXIUM) 40 MG capsule Take 40 mg by mouth daily before breakfast.     . folic acid (FOLVITE) 1 MG tablet Take 1 mg by mouth daily.    Marland Kitchen HYDROcodone-acetaminophen (NORCO/VICODIN) 5-325 MG per tablet 1 tab PO q12 hours prn pain (Patient not taking: Reported on 05/09/2014) 20 tablet 0  . loratadine (CLARITIN) 10 MG tablet Take 10 mg by mouth daily.    . ondansetron (ZOFRAN ODT) 4 MG disintegrating tablet 36m ODT q4 hours prn nausea/vomit (Patient not taking: Reported on 05/09/2014) 4 tablet 0  . oxyCODONE-acetaminophen (PERCOCET/ROXICET) 5-325 MG per tablet Take 1 tablet by mouth every 6 (six) hours as needed for moderate pain.     . pantoprazole (PROTONIX) 40 MG tablet Take 40 mg by mouth daily.    .Marland Kitchentelmisartan-hydrochlorothiazide (MICARDIS HCT) 80-12.5 MG per tablet Take 1 tablet by mouth daily.     . traMADol (ULTRAM) 50 MG tablet Take 1 tablet (50 mg total) by mouth every 6 (six) hours as needed. (Patient not taking: Reported on 05/09/2014) 6 tablet 0    Results for orders placed or performed during the hospital encounter of 05/14/14 (from the past 48 hour(s))  Basic metabolic panel     Status: Abnormal   Collection Time: 05/14/14  8:48 AM  Result Value Ref Range   Sodium 136 135 - 145 mmol/L   Potassium 3.7 3.5 - 5.1 mmol/L   Chloride 102 96 - 112 mmol/L   CO2 29 19 - 32 mmol/L   Glucose, Bld 97 70 - 99 mg/dL   BUN 10 6 - 23 mg/dL   Creatinine, Ser 1.08 0.50 - 1.10 mg/dL   Calcium 9.2 8.4 - 10.5 mg/dL   GFR calc non Af Amer 50 (L) >90 mL/min   GFR calc Af Amer 58 (L) >90 mL/min    Comment: (NOTE) The eGFR has been calculated using the CKD EPI equation. This calculation has not been validated in all clinical situations. eGFR's persistently <90 mL/min signify possible Chronic Kidney Disease.    Anion gap 5 5 - 15  CBC     Status: Abnormal   Collection  Time: 05/14/14  8:48 AM  Result Value Ref Range   WBC 7.4 4.0 - 10.5 K/uL   RBC 4.63 3.87 - 5.11 MIL/uL   Hemoglobin 11.5 (L) 12.0 - 15.0 g/dL   HCT 36.7 36.0 - 46.0 %   MCV 79.3 78.0 - 100.0 fL   MCH 24.8 (L) 26.0 - 34.0 pg   MCHC 31.3 30.0 - 36.0 g/dL   RDW 14.8 11.5 - 15.5 %   Platelets 266 150 - 400 K/uL   No results found.  ROS: Pain with rom of the right upper extremity  Physical Exam:  Alert and oriented 74y.o. female in no acute distress Cranial  nerves 2-12 intact Cervical spine: full rom with no tenderness, nv intact distally Chest: active breath sounds bilaterally, no wheeze rhonchi or rales Heart: regular rate and rhythm, no murmur Abd: non tender non distended with active bowel sounds Hip is stable with rom  Right shoulder with limited rom and strength nv intact distally No rashes or edema Strength of ER and IR 3/5  Assessment/Plan Assessment: right shoulder rotator cuff insufficiency  Plan: Patient will undergo a right reverse total shoulder arthroplasty by Dr. Veverly Fells at [facility]. Risks benefits and expectations were discussed with the patient. Patient understand risks, benefits and expectations and wishes to proceed.

## 2014-05-17 NOTE — Progress Notes (Addendum)
Anesthesia Chart Review:  Pt is 74 year old female scheduled for R reverse sholder arthroplasty on 05/24/2014 with Dr. Veverly Fells.   PMH includes: HTN, COPD, anemia, tubular adenoma. S/p decompressive lumbar laminectomy 04/20/2012, s/p R TKA 01/24/2012. Former smoker. BMI 37.   Preoperative labs reviewed.    Chest x-ray 10/15/2013 reviewed. No active cardiopulmonary disease.   EKG: NSR.   Echo 06/01/2012: - Left ventricle: The cavity size was normal. There was mild focal basal hypertrophy of the septum. Systolic function was normal. The estimated ejection fraction was in the range of 55% to 60%. Wall motion was normal; there were noregional wall motion abnormalities. - Aortic valve: Mildly calcified annulus. Trileaflet; normal thickness leaflets. - Atrial septum: No defect or patent foramen ovale was identified.  Pt reported wheezing at PAT, used inhaler and sx resolved. Pt reported to RN no new symptoms or change in pulmonary status. Pt instructed to use inhalers DOS.   If no changes, I anticipate pt can proceed with surgery as scheduled.    Willeen Cass, FNP-BC [facility]/Anesthesiology Phone: [phone] 05/17/2014 1:16 PM  Addendum:  Pt has medical clearance for surgery from PCP Dr. Delphina Cahill. Dr. [name] Crane notes pt has significant breathing issues, chronic asthmatic bronchitis but offers no other guidance.   Willeen Cass, FNP-BC [facility]/Anesthesiology Phone: [phone] 05/17/2014 2:59 PM

## 2014-05-23 MED ORDER — CLINDAMYCIN PHOSPHATE 900 MG/50ML IV SOLN
900.0000 mg | INTRAVENOUS | Status: AC
  Administered 2014-05-24: 900 mg via INTRAVENOUS
  Filled 2014-05-23: qty 50

## 2014-05-23 NOTE — Anesthesia Preprocedure Evaluation (Addendum)
Anesthesia Evaluation  Patient identified by MRN, date of birth, ID band Patient awake    Airway Mallampati: II   Neck ROM: Full    Dental  (+) Dental Advisory Given   Pulmonary COPD COPD inhaler, former smoker (quit 1993 80 pack year),  breath sounds clear to auscultation        Cardiovascular hypertension, Pt. on medications Rhythm:Regular  ECHO 2014 EF 55%   Neuro/Psych Anxiety    GI/Hepatic GERD-  Medicated,  Endo/Other    Renal/GU GFR 56     Musculoskeletal   Abdominal (+)  Abdomen: soft.    Peds  Hematology 11/36   Anesthesia Other Findings   Reproductive/Obstetrics                            Anesthesia Physical Anesthesia Plan  ASA: III  Anesthesia Plan: General   Post-op Pain Management: MAC Combined w/ Regional for Post-op pain   Induction: Intravenous  Airway Management Planned: Oral ETT  Additional Equipment:   Intra-op Plan:   Post-operative Plan: Extubation in OR  Informed Consent: I have reviewed the patients History and Physical, chart, labs and discussed the procedure including the risks, benefits and alternatives for the proposed anesthesia with the patient or authorized representative who has indicated his/her understanding and acceptance.     Plan Discussed with:   Anesthesia Plan Comments:         Anesthesia Quick Evaluation

## 2014-05-24 ENCOUNTER — Encounter (HOSPITAL_COMMUNITY)
Admission: RE | Disposition: A | Discharge status: Home / Self Care | Payer: Self-pay | Source: Ambulatory Visit | Attending: Orthopedic Surgery

## 2014-05-24 ENCOUNTER — Inpatient Hospital Stay (HOSPITAL_COMMUNITY): Payer: PPO | Admitting: Anesthesiology

## 2014-05-24 ENCOUNTER — Inpatient Hospital Stay (HOSPITAL_COMMUNITY): Payer: PPO

## 2014-05-24 ENCOUNTER — Inpatient Hospital Stay (HOSPITAL_COMMUNITY)
Admission: RE | Admit: 2014-05-24 | Discharge: 2014-05-27 | DRG: 483 | Disposition: A | Discharge status: Home / Self Care | Payer: PPO | Source: Ambulatory Visit | Attending: Orthopedic Surgery | Admitting: Orthopedic Surgery

## 2014-05-24 ENCOUNTER — Encounter (HOSPITAL_COMMUNITY): Payer: Self-pay | Admitting: Anesthesiology

## 2014-05-24 ENCOUNTER — Inpatient Hospital Stay (HOSPITAL_COMMUNITY): Payer: PPO | Admitting: Emergency Medicine

## 2014-05-24 DIAGNOSIS — Z87891 Personal history of nicotine dependence: Secondary | ICD-10-CM

## 2014-05-24 DIAGNOSIS — Z91048 Other nonmedicinal substance allergy status: Secondary | ICD-10-CM

## 2014-05-24 DIAGNOSIS — Z881 Allergy status to other antibiotic agents status: Secondary | ICD-10-CM

## 2014-05-24 DIAGNOSIS — M064 Inflammatory polyarthropathy: Secondary | ICD-10-CM | POA: Diagnosis present

## 2014-05-24 DIAGNOSIS — K219 Gastro-esophageal reflux disease without esophagitis: Secondary | ICD-10-CM | POA: Diagnosis present

## 2014-05-24 DIAGNOSIS — M19011 Primary osteoarthritis, right shoulder: Secondary | ICD-10-CM | POA: Diagnosis present

## 2014-05-24 DIAGNOSIS — I10 Essential (primary) hypertension: Secondary | ICD-10-CM | POA: Diagnosis present

## 2014-05-24 DIAGNOSIS — J449 Chronic obstructive pulmonary disease, unspecified: Secondary | ICD-10-CM | POA: Diagnosis present

## 2014-05-24 DIAGNOSIS — Z7951 Long term (current) use of inhaled steroids: Secondary | ICD-10-CM | POA: Diagnosis not present

## 2014-05-24 DIAGNOSIS — M549 Dorsalgia, unspecified: Secondary | ICD-10-CM | POA: Diagnosis present

## 2014-05-24 DIAGNOSIS — Z888 Allergy status to other drugs, medicaments and biological substances status: Secondary | ICD-10-CM

## 2014-05-24 DIAGNOSIS — M25511 Pain in right shoulder: Secondary | ICD-10-CM | POA: Diagnosis present

## 2014-05-24 DIAGNOSIS — Z96651 Presence of right artificial knee joint: Secondary | ICD-10-CM | POA: Diagnosis present

## 2014-05-24 DIAGNOSIS — G8929 Other chronic pain: Secondary | ICD-10-CM | POA: Diagnosis present

## 2014-05-24 DIAGNOSIS — M75101 Unspecified rotator cuff tear or rupture of right shoulder, not specified as traumatic: Secondary | ICD-10-CM | POA: Diagnosis present

## 2014-05-24 DIAGNOSIS — D649 Anemia, unspecified: Secondary | ICD-10-CM | POA: Diagnosis present

## 2014-05-24 DIAGNOSIS — F419 Anxiety disorder, unspecified: Secondary | ICD-10-CM | POA: Diagnosis present

## 2014-05-24 DIAGNOSIS — Z91018 Allergy to other foods: Secondary | ICD-10-CM | POA: Diagnosis not present

## 2014-05-24 DIAGNOSIS — Z96611 Presence of right artificial shoulder joint: Secondary | ICD-10-CM

## 2014-05-24 DIAGNOSIS — Z96619 Presence of unspecified artificial shoulder joint: Secondary | ICD-10-CM

## 2014-05-24 HISTORY — PX: REVERSE SHOULDER ARTHROPLASTY: SHX5054

## 2014-05-24 SURGERY — ARTHROPLASTY, SHOULDER, TOTAL, REVERSE
Anesthesia: General | Site: Shoulder | Laterality: Right

## 2014-05-24 MED ORDER — ONDANSETRON HCL 4 MG/2ML IJ SOLN
INTRAMUSCULAR | Status: DC | PRN
  Administered 2014-05-24: 4 mg via INTRAVENOUS

## 2014-05-24 MED ORDER — ONDANSETRON HCL 4 MG PO TABS: 4.0000 mg | ORAL_TABLET | Freq: Four times a day (QID) | ORAL | Status: DC | PRN

## 2014-05-24 MED ORDER — HYDROCODONE-ACETAMINOPHEN 7.5-325 MG PO TABS
1.0000 | ORAL_TABLET | Freq: Four times a day (QID) | ORAL | Status: DC | PRN
Start: 2014-05-24 — End: 2015-01-21

## 2014-05-24 MED ORDER — DOCUSATE SODIUM 100 MG PO CAPS
100.0000 mg | ORAL_CAPSULE | Freq: Every day | ORAL | Status: DC
  Administered 2014-05-24 – 2014-05-27 (×4): 100 mg via ORAL
  Filled 2014-05-24 (×4): qty 1

## 2014-05-24 MED ORDER — PANTOPRAZOLE SODIUM 40 MG PO TBEC
40.0000 mg | DELAYED_RELEASE_TABLET | Freq: Every day | ORAL | Status: DC
  Administered 2014-05-25 – 2014-05-27 (×3): 40 mg via ORAL
  Filled 2014-05-24 (×3): qty 1

## 2014-05-24 MED ORDER — PROPOFOL 10 MG/ML IV BOLUS
INTRAVENOUS | Status: DC | PRN
  Administered 2014-05-24: 130 mg via INTRAVENOUS

## 2014-05-24 MED ORDER — ACETAMINOPHEN 325 MG PO TABS: 650.0000 mg | ORAL_TABLET | Freq: Four times a day (QID) | ORAL | Status: DC | PRN

## 2014-05-24 MED ORDER — BUPIVACAINE-EPINEPHRINE (PF) 0.5% -1:200000 IJ SOLN
INTRAMUSCULAR | Status: DC | PRN
  Administered 2014-05-24: 20 mL via PERINEURAL

## 2014-05-24 MED ORDER — MEPERIDINE HCL 25 MG/ML IJ SOLN: 6.2500 mg | INTRAMUSCULAR | Status: DC | PRN

## 2014-05-24 MED ORDER — FENTANYL CITRATE 0.05 MG/ML IJ SOLN
INTRAMUSCULAR | Status: AC
  Filled 2014-05-24: qty 5

## 2014-05-24 MED ORDER — PHENOL 1.4 % MT LIQD: 1.0000 | OROMUCOSAL | Status: DC | PRN

## 2014-05-24 MED ORDER — METOCLOPRAMIDE HCL 5 MG/ML IJ SOLN: 5.0000 mg | Freq: Three times a day (TID) | INTRAMUSCULAR | Status: DC | PRN

## 2014-05-24 MED ORDER — MENTHOL 3 MG MT LOZG
1.0000 | LOZENGE | OROMUCOSAL | Status: DC | PRN
  Filled 2014-05-24: qty 9

## 2014-05-24 MED ORDER — BUPIVACAINE-EPINEPHRINE 0.25% -1:200000 IJ SOLN
INTRAMUSCULAR | Status: DC | PRN
Start: 2014-05-24 — End: 2014-05-24
  Administered 2014-05-24: 10 mL

## 2014-05-24 MED ORDER — EPINEPHRINE 0.3 MG/0.3ML IJ DEVI: 0.3000 mg | Freq: Once | INTRAMUSCULAR | Status: DC

## 2014-05-24 MED ORDER — LORATADINE 10 MG PO TABS: 10.0000 mg | ORAL_TABLET | Freq: Every day | ORAL | Status: DC

## 2014-05-24 MED ORDER — FLUTICASONE PROPIONATE HFA 44 MCG/ACT IN AERO
2.0000 | INHALATION_SPRAY | Freq: Two times a day (BID) | RESPIRATORY_TRACT | Status: DC
  Administered 2014-05-24 – 2014-05-26 (×6): 2 via RESPIRATORY_TRACT
  Filled 2014-05-24 (×2): qty 10.6

## 2014-05-24 MED ORDER — CHLORHEXIDINE GLUCONATE 4 % EX LIQD
60.0000 mL | Freq: Once | CUTANEOUS | Status: DC
  Filled 2014-05-24: qty 60

## 2014-05-24 MED ORDER — HYDROCODONE-ACETAMINOPHEN 5-325 MG PO TABS
1.0000 | ORAL_TABLET | ORAL | Status: DC | PRN
  Administered 2014-05-27 (×3): 1 via ORAL
  Filled 2014-05-24 (×3): qty 1

## 2014-05-24 MED ORDER — MIDAZOLAM HCL 2 MG/2ML IJ SOLN
INTRAMUSCULAR | Status: AC
  Filled 2014-05-24: qty 2

## 2014-05-24 MED ORDER — LIDOCAINE HCL (CARDIAC) 20 MG/ML IV SOLN
INTRAVENOUS | Status: AC
  Filled 2014-05-24: qty 5

## 2014-05-24 MED ORDER — BUPIVACAINE-EPINEPHRINE (PF) 0.25% -1:200000 IJ SOLN
INTRAMUSCULAR | Status: AC
  Filled 2014-05-24: qty 30

## 2014-05-24 MED ORDER — METHOCARBAMOL 500 MG PO TABS
500.0000 mg | ORAL_TABLET | Freq: Four times a day (QID) | ORAL | Status: DC | PRN
  Administered 2014-05-24 – 2014-05-26 (×4): 500 mg via ORAL
  Filled 2014-05-24 (×5): qty 1

## 2014-05-24 MED ORDER — ONDANSETRON 4 MG PO TBDP
4.0000 mg | ORAL_TABLET | Freq: Three times a day (TID) | ORAL | Status: DC | PRN
  Filled 2014-05-24: qty 1

## 2014-05-24 MED ORDER — PHENYLEPHRINE HCL 10 MG/ML IJ SOLN
INTRAMUSCULAR | Status: DC | PRN
  Administered 2014-05-24 (×2): 80 ug via INTRAVENOUS
  Administered 2014-05-24: 120 ug via INTRAVENOUS

## 2014-05-24 MED ORDER — METHOCARBAMOL 1000 MG/10ML IJ SOLN
500.0000 mg | Freq: Four times a day (QID) | INTRAVENOUS | Status: DC | PRN
  Filled 2014-05-24: qty 5

## 2014-05-24 MED ORDER — ACETAMINOPHEN 650 MG RE SUPP: 650.0000 mg | Freq: Four times a day (QID) | RECTAL | Status: DC | PRN

## 2014-05-24 MED ORDER — GLYCOPYRROLATE 0.2 MG/ML IJ SOLN
INTRAMUSCULAR | Status: DC | PRN
  Administered 2014-05-24 (×2): 0.2 mg via INTRAVENOUS

## 2014-05-24 MED ORDER — LACTATED RINGERS IV SOLN
INTRAVENOUS | Status: DC | PRN
  Administered 2014-05-24: 08:00:00 via INTRAVENOUS

## 2014-05-24 MED ORDER — FENTANYL CITRATE 0.05 MG/ML IJ SOLN: 25.0000 ug | INTRAMUSCULAR | Status: DC | PRN

## 2014-05-24 MED ORDER — ONDANSETRON HCL 4 MG/2ML IJ SOLN
4.0000 mg | Freq: Four times a day (QID) | INTRAMUSCULAR | Status: DC | PRN
  Administered 2014-05-26: 4 mg via INTRAVENOUS
  Filled 2014-05-24: qty 2

## 2014-05-24 MED ORDER — PROMETHAZINE HCL 25 MG/ML IJ SOLN
6.2500 mg | INTRAMUSCULAR | Status: DC | PRN
Start: 2014-05-24 — End: 2014-05-24

## 2014-05-24 MED ORDER — SODIUM CHLORIDE 0.9 % IV SOLN: INTRAVENOUS | Status: DC

## 2014-05-24 MED ORDER — ALBUTEROL SULFATE HFA 108 (90 BASE) MCG/ACT IN AERS: 2.0000 | INHALATION_SPRAY | Freq: Four times a day (QID) | RESPIRATORY_TRACT | Status: DC | PRN

## 2014-05-24 MED ORDER — ALPRAZOLAM 0.5 MG PO TABS
0.5000 mg | ORAL_TABLET | Freq: Four times a day (QID) | ORAL | Status: DC | PRN
  Administered 2014-05-24 – 2014-05-27 (×6): 0.5 mg via ORAL
  Filled 2014-05-24 (×6): qty 1

## 2014-05-24 MED ORDER — ALBUTEROL SULFATE (2.5 MG/3ML) 0.083% IN NEBU
2.5000 mg | INHALATION_SOLUTION | RESPIRATORY_TRACT | Status: DC | PRN
  Administered 2014-05-24 – 2014-05-26 (×5): 2.5 mg via RESPIRATORY_TRACT
  Filled 2014-05-24 (×3): qty 3

## 2014-05-24 MED ORDER — SODIUM CHLORIDE 0.9 % IR SOLN
Status: DC | PRN
  Administered 2014-05-24: 1000 mL

## 2014-05-24 MED ORDER — CLOTRIMAZOLE 1 % EX CREA
TOPICAL_CREAM | Freq: Two times a day (BID) | CUTANEOUS | Status: DC
  Administered 2014-05-24: 1 via TOPICAL
  Administered 2014-05-25 – 2014-05-27 (×5): via TOPICAL
  Filled 2014-05-24: qty 15

## 2014-05-24 MED ORDER — PROPOFOL 10 MG/ML IV BOLUS
INTRAVENOUS | Status: AC
  Filled 2014-05-24: qty 20

## 2014-05-24 MED ORDER — DIPHENHYDRAMINE HCL 25 MG PO CAPS
25.0000 mg | ORAL_CAPSULE | Freq: Every evening | ORAL | Status: DC | PRN
  Administered 2014-05-27: 25 mg via ORAL
  Filled 2014-05-24 (×3): qty 1

## 2014-05-24 MED ORDER — ROCURONIUM BROMIDE 50 MG/5ML IV SOLN
INTRAVENOUS | Status: AC
  Filled 2014-05-24: qty 1

## 2014-05-24 MED ORDER — ALBUTEROL SULFATE (2.5 MG/3ML) 0.083% IN NEBU
INHALATION_SOLUTION | RESPIRATORY_TRACT | Status: AC
  Administered 2014-05-24: 2.5 mg via RESPIRATORY_TRACT
  Filled 2014-05-24: qty 3

## 2014-05-24 MED ORDER — DEXAMETHASONE SODIUM PHOSPHATE 4 MG/ML IJ SOLN
INTRAMUSCULAR | Status: DC | PRN
  Administered 2014-05-24: 8 mg via INTRAVENOUS

## 2014-05-24 MED ORDER — LIDOCAINE HCL (CARDIAC) 20 MG/ML IV SOLN
INTRAVENOUS | Status: DC | PRN
  Administered 2014-05-24: 100 mg via INTRAVENOUS

## 2014-05-24 MED ORDER — IRBESARTAN 300 MG PO TABS
300.0000 mg | ORAL_TABLET | Freq: Every day | ORAL | Status: DC
  Administered 2014-05-24 – 2014-05-27 (×4): 300 mg via ORAL
  Filled 2014-05-24 (×4): qty 1

## 2014-05-24 MED ORDER — TELMISARTAN-HCTZ 80-12.5 MG PO TABS
1.0000 | ORAL_TABLET | Freq: Every day | ORAL | Status: DC
Start: 2014-05-24 — End: 2014-05-24

## 2014-05-24 MED ORDER — NEOSTIGMINE METHYLSULFATE 10 MG/10ML IV SOLN
INTRAVENOUS | Status: DC | PRN
  Administered 2014-05-24: 2 mg via INTRAVENOUS

## 2014-05-24 MED ORDER — MIDAZOLAM HCL 2 MG/2ML IJ SOLN
INTRAMUSCULAR | Status: AC
  Administered 2014-05-24: 1 mg
  Filled 2014-05-24: qty 2

## 2014-05-24 MED ORDER — FOLIC ACID 1 MG PO TABS
1.0000 mg | ORAL_TABLET | Freq: Every day | ORAL | Status: DC
  Administered 2014-05-24 – 2014-05-27 (×4): 1 mg via ORAL
  Filled 2014-05-24 (×4): qty 1

## 2014-05-24 MED ORDER — ROCURONIUM BROMIDE 100 MG/10ML IV SOLN
INTRAVENOUS | Status: DC | PRN
  Administered 2014-05-24: 25 mg via INTRAVENOUS

## 2014-05-24 MED ORDER — METOCLOPRAMIDE HCL 5 MG PO TABS
5.0000 mg | ORAL_TABLET | Freq: Three times a day (TID) | ORAL | Status: DC | PRN
  Filled 2014-05-24: qty 2

## 2014-05-24 MED ORDER — PANTOPRAZOLE SODIUM 40 MG PO TBEC: 80.0000 mg | DELAYED_RELEASE_TABLET | Freq: Every day | ORAL | Status: DC

## 2014-05-24 MED ORDER — ONDANSETRON HCL 4 MG/2ML IJ SOLN
INTRAMUSCULAR | Status: AC
  Filled 2014-05-24: qty 2

## 2014-05-24 MED ORDER — TRAMADOL HCL 50 MG PO TABS
100.0000 mg | ORAL_TABLET | Freq: Four times a day (QID) | ORAL | Status: DC | PRN
  Administered 2014-05-25 – 2014-05-26 (×3): 100 mg via ORAL
  Filled 2014-05-24 (×3): qty 2

## 2014-05-24 MED ORDER — METHOCARBAMOL 500 MG PO TABS: 500.0000 mg | ORAL_TABLET | Freq: Three times a day (TID) | ORAL | Status: DC | PRN

## 2014-05-24 MED ORDER — OXYCODONE-ACETAMINOPHEN 5-325 MG PO TABS
1.0000 | ORAL_TABLET | Freq: Four times a day (QID) | ORAL | Status: DC | PRN
  Administered 2014-05-24 – 2014-05-26 (×6): 1 via ORAL
  Filled 2014-05-24 (×6): qty 1

## 2014-05-24 MED ORDER — PHENYLEPHRINE HCL 10 MG/ML IJ SOLN
10.0000 mg | INTRAVENOUS | Status: DC | PRN
  Administered 2014-05-24: 30 ug/min via INTRAVENOUS

## 2014-05-24 MED ORDER — FENTANYL CITRATE 0.05 MG/ML IJ SOLN
INTRAMUSCULAR | Status: AC
  Administered 2014-05-24: 50 ug
  Filled 2014-05-24: qty 2

## 2014-05-24 MED ORDER — HYDROMORPHONE HCL 1 MG/ML IJ SOLN
1.0000 mg | INTRAMUSCULAR | Status: DC | PRN
  Administered 2014-05-25 – 2014-05-26 (×3): 1 mg via INTRAVENOUS
  Filled 2014-05-24 (×3): qty 1

## 2014-05-24 MED ORDER — HYDROCHLOROTHIAZIDE 12.5 MG PO CAPS
12.5000 mg | ORAL_CAPSULE | Freq: Every day | ORAL | Status: DC
  Administered 2014-05-24 – 2014-05-27 (×4): 12.5 mg via ORAL
  Filled 2014-05-24 (×4): qty 1

## 2014-05-24 MED ORDER — LACTATED RINGERS IV SOLN: INTRAVENOUS | Status: DC

## 2014-05-24 MED ORDER — CLINDAMYCIN PHOSPHATE 600 MG/50ML IV SOLN
600.0000 mg | Freq: Four times a day (QID) | INTRAVENOUS | Status: AC
  Administered 2014-05-24 – 2014-05-25 (×3): 600 mg via INTRAVENOUS
  Filled 2014-05-24 (×3): qty 50

## 2014-05-24 SURGICAL SUPPLY — 59 items
BIT DRILL 5/64X5 DISP (BIT) ×3 IMPLANT
BLADE SAG 18X100X1.27 (BLADE) ×3 IMPLANT
CAPT SHLDR REVTOTAL 1 ×2 IMPLANT
CLOSURE WOUND 1/2 X4 (GAUZE/BANDAGES/DRESSINGS) ×1
COVER SURGICAL LIGHT HANDLE (MISCELLANEOUS) ×3 IMPLANT
DRAPE INCISE IOBAN 66X45 STRL (DRAPES) ×6 IMPLANT
DRAPE U-SHAPE 47X51 STRL (DRAPES) ×3 IMPLANT
DRAPE X-RAY CASS 24X20 (DRAPES) IMPLANT
DRSG ADAPTIC 3X8 NADH LF (GAUZE/BANDAGES/DRESSINGS) ×3 IMPLANT
DRSG PAD ABDOMINAL 8X10 ST (GAUZE/BANDAGES/DRESSINGS) ×3 IMPLANT
DURAPREP 26ML APPLICATOR (WOUND CARE) ×3 IMPLANT
ELECT BLADE 4.0 EZ CLEAN MEGAD (MISCELLANEOUS) ×3
ELECT NDL TIP 2.8 STRL (NEEDLE) ×1 IMPLANT
ELECT NEEDLE TIP 2.8 STRL (NEEDLE) ×3 IMPLANT
ELECT REM PT RETURN 9FT ADLT (ELECTROSURGICAL) ×3
ELECTRODE BLDE 4.0 EZ CLN MEGD (MISCELLANEOUS) ×1 IMPLANT
ELECTRODE REM PT RTRN 9FT ADLT (ELECTROSURGICAL) ×1 IMPLANT
GAUZE SPONGE 4X4 12PLY STRL (GAUZE/BANDAGES/DRESSINGS) ×3 IMPLANT
GLOVE BIOGEL PI ORTHO PRO 7.5 (GLOVE) ×2
GLOVE BIOGEL PI ORTHO PRO SZ8 (GLOVE) ×2
GLOVE ORTHO TXT STRL SZ7.5 (GLOVE) ×3 IMPLANT
GLOVE PI ORTHO PRO STRL 7.5 (GLOVE) ×1 IMPLANT
GLOVE PI ORTHO PRO STRL SZ8 (GLOVE) ×1 IMPLANT
GLOVE SURG ORTHO 8.5 STRL (GLOVE) ×3 IMPLANT
GOWN STRL REUS W/ TWL LRG LVL3 (GOWN DISPOSABLE) ×1 IMPLANT
GOWN STRL REUS W/ TWL XL LVL3 (GOWN DISPOSABLE) ×2 IMPLANT
GOWN STRL REUS W/TWL LRG LVL3 (GOWN DISPOSABLE) ×3
GOWN STRL REUS W/TWL XL LVL3 (GOWN DISPOSABLE) ×6
HANDPIECE INTERPULSE COAX TIP (DISPOSABLE)
KIT BASIN OR (CUSTOM PROCEDURE TRAY) ×3 IMPLANT
KIT ROOM TURNOVER OR (KITS) ×3 IMPLANT
MANIFOLD NEPTUNE II (INSTRUMENTS) ×3 IMPLANT
NDL 1/2 CIR MAYO (NEEDLE) ×1 IMPLANT
NDL HYPO 25GX1X1/2 BEV (NEEDLE) ×1 IMPLANT
NEEDLE 1/2 CIR MAYO (NEEDLE) ×3 IMPLANT
NEEDLE HYPO 25GX1X1/2 BEV (NEEDLE) ×3 IMPLANT
NS IRRIG 1000ML POUR BTL (IV SOLUTION) ×3 IMPLANT
PACK SHOULDER (CUSTOM PROCEDURE TRAY) ×3 IMPLANT
PAD ARMBOARD 7.5X6 YLW CONV (MISCELLANEOUS) ×6 IMPLANT
SET HNDPC FAN SPRY TIP SCT (DISPOSABLE) IMPLANT
SLING ARM LRG ADULT FOAM STRAP (SOFTGOODS) IMPLANT
SLING ARM MED ADULT FOAM STRAP (SOFTGOODS) ×2 IMPLANT
SPONGE LAP 18X18 X RAY DECT (DISPOSABLE) ×3 IMPLANT
SPONGE LAP 4X18 X RAY DECT (DISPOSABLE) ×3 IMPLANT
STRIP CLOSURE SKIN 1/2X4 (GAUZE/BANDAGES/DRESSINGS) ×2 IMPLANT
SUCTION FRAZIER TIP 10 FR DISP (SUCTIONS) ×3 IMPLANT
SUT FIBERWIRE #2 38 T-5 BLUE (SUTURE) ×6
SUT MNCRL AB 4-0 PS2 18 (SUTURE) ×3 IMPLANT
SUT VIC AB 2-0 CT1 27 (SUTURE) ×3
SUT VIC AB 2-0 CT1 TAPERPNT 27 (SUTURE) ×1 IMPLANT
SUTURE FIBERWR #2 38 T-5 BLUE (SUTURE) ×2 IMPLANT
SYR CONTROL 10ML LL (SYRINGE) ×3 IMPLANT
TAPE CLOTH SURG 6X10 WHT LF (GAUZE/BANDAGES/DRESSINGS) ×2 IMPLANT
TOWEL OR 17X24 6PK STRL BLUE (TOWEL DISPOSABLE) ×3 IMPLANT
TOWEL OR 17X26 10 PK STRL BLUE (TOWEL DISPOSABLE) ×3 IMPLANT
TOWER CARTRIDGE SMART MIX (DISPOSABLE) IMPLANT
TRAY FOLEY CATH 16FRSI W/METER (SET/KITS/TRAYS/PACK) ×1 IMPLANT
WATER STERILE IRR 1000ML POUR (IV SOLUTION) ×3 IMPLANT
YANKAUER SUCT BULB TIP NO VENT (SUCTIONS) IMPLANT

## 2014-05-24 NOTE — Progress Notes (Addendum)
Patient anxious after using bathroom this evening.  Patient coughing frequently.  Repositioned in bed, O2 re-applied.  Incentive spirometer and relaxation encouraged.  Respiratory up to give patient inhaler.  O2 at 100% on 2L.  Patient more calm.  Nursing will continue to monitor.

## 2014-05-24 NOTE — Anesthesia Postprocedure Evaluation (Signed)
  Anesthesia Post-op Note  Patient: Glenda Terry  Procedure(s) Performed: Procedure(s): RIGHT SHOULDER REVERSE ARTHROPLASTY (Right)  Patient Location: PACU  Anesthesia Type:General  Level of Consciousness: awake and alert   Airway and Oxygen Therapy: Patient Spontanous Breathing and Patient connected to nasal cannula oxygen  Post-op Pain: mild  Post-op Assessment: Post-op Vital signs reviewed, Patient's Cardiovascular Status Stable, Patent Airway and No signs of Nausea or vomiting  Post-op Vital Signs: Reviewed and stable  Last Vitals:  Filed Vitals:   05/24/14 1116  BP: 120/68  Pulse: 89  Temp:   Resp: 15    Complications: No apparent anesthesia complications

## 2014-05-24 NOTE — Transfer of Care (Addendum)
Immediate Anesthesia Transfer of Care Note  Patient: Glenda Terry  Procedure(s) Performed: Procedure(s): RIGHT SHOULDER REVERSE ARTHROPLASTY (Right)  Patient Location: PACU  Anesthesia Type:General and Regional  Level of Consciousness: awake, alert , oriented and patient cooperative  Airway & Oxygen Therapy: Patient Spontanous Breathing and Patient connected to nasal cannula oxygen  Post-op Assessment: Report given to RN and Post -op Vital signs reviewed and stable  Post vital signs: Reviewed and stable  Last Vitals:  Filed Vitals:   05/24/14 0719  BP: 146/69  Pulse: 80  Temp: 36.4 C  Resp: 16    Complications: No apparent anesthesia complications

## 2014-05-24 NOTE — Interval H&P Note (Signed)
History and Physical Interval Note:  05/24/2014 8:34 AM  Glenda Terry  has presented today for surgery, with the diagnosis of RIGHT SHOULDER OA/ROTATOR CUFF INSUFFICIENCY  The various methods of treatment have been discussed with the patient and family. After consideration of risks, benefits and other options for treatment, the patient has consented to  Procedure(s): RIGHT SHOULDER REVERSE ARTHROPLASTY (Right) as a surgical intervention .  The patient's history has been reviewed, patient examined, no change in status, stable for surgery.  I have reviewed the patient's chart and labs.  Questions were answered to the patient's satisfaction.     Isamar Wellbrock,STEVEN R

## 2014-05-24 NOTE — Anesthesia Procedure Notes (Signed)
Anesthesia Regional Block:  Interscalene brachial plexus block  Pre-Anesthetic Checklist: ,, timeout performed, Correct Patient, Correct Site, Correct Laterality, Correct Procedure, Correct Position, site marked, Risks and benefits discussed,  Surgical consent,  Pre-op evaluation,  At surgeon's request and post-op pain management  Laterality: Right  Prep: Maximum Sterile Barrier Precautions used       Needles:   Needle Type: Echogenic Stimulator Needle     Needle Length: 9cm 9 cm Needle Gauge: 21 and 21 G    Additional Needles:  Procedures: ultrasound guided (picture in chart) and nerve stimulator Interscalene brachial plexus block  Nerve Stimulator or Paresthesia:  Response: 0.4 mA,   Additional Responses:   Narrative:  Injection made incrementally with aspirations every 5 mL.  Performed by: Personally  Anesthesiologist: Alexis Frock  Additional Notes: R IS block, [place] and Stim, multiple asp, talked to patient throughout, no complications, 51ZG .5% marcaine with epi

## 2014-05-24 NOTE — Progress Notes (Signed)
Utilization Review Completed.Donne Anon T3/01/2015

## 2014-05-24 NOTE — Discharge Instructions (Signed)
Ice to the shoulder at all times.  No weight bearing with the right arm.  Do not use the arm to push out of a chair!!!  Keep the arm positioned in front of the body.  OK to do light activities of daily living.  Keep the incision covered and clean and dry for one week, then ok to get wet in the shower.  Call [phone] for follow up appointment in two weeks

## 2014-05-24 NOTE — Brief Op Note (Signed)
05/24/2014  11:07 AM  PATIENT:  Glenda Terry  74 y.o. female  PRE-OPERATIVE DIAGNOSIS:  RIGHT SHOULDER OA/ROTATOR CUFF INSUFFICIENCY  POST-OPERATIVE DIAGNOSIS:  RIGHT SHOULDER OA/ROTATOR CUFF INSUFFICIENCY  PROCEDURE:  Procedure(s): RIGHT SHOULDER REVERSE ARTHROPLASTY (Right)  SURGEON:  Surgeon(s) and Role:    * Netta Cedars, MD - Primary  PHYSICIAN ASSISTANT:   ASSISTANTS: Ventura Bruns, PA-C   ANESTHESIA:   regional and general  EBL:     BLOOD ADMINISTERED:none  DRAINS: none   LOCAL MEDICATIONS USED:  MARCAINE     SPECIMEN:  No Specimen  DISPOSITION OF SPECIMEN:  N/A  COUNTS:  YES  TOURNIQUET:  * No tourniquets in log *  DICTATION: .Other Dictation: Dictation Number [id number]  PLAN OF CARE: Admit to inpatient   PATIENT DISPOSITION:  PACU - hemodynamically stable.   Delay start of Pharmacological VTE agent (>24hrs) due to surgical blood loss or risk of bleeding: not applicable

## 2014-05-24 NOTE — Progress Notes (Signed)
Reprt to [employer] for lunch

## 2014-05-25 LAB — BASIC METABOLIC PANEL
Anion gap: 11 (ref 5–15)
BUN: 12 mg/dL (ref 6–23)
CHLORIDE: 99 mmol/L (ref 96–112)
CO2: 25 mmol/L (ref 19–32)
CREATININE: 1.03 mg/dL (ref 0.50–1.10)
Calcium: 9.3 mg/dL (ref 8.4–10.5)
GFR calc Af Amer: 62 mL/min — ABNORMAL LOW (ref >90)
GFR calc non Af Amer: 53 mL/min — ABNORMAL LOW (ref >90)
Glucose, Bld: 131 mg/dL — ABNORMAL HIGH (ref 70–99)
POTASSIUM: 4.2 mmol/L (ref 3.5–5.1)
Sodium: 135 mmol/L (ref 135–145)

## 2014-05-25 LAB — HEMOGLOBIN AND HEMATOCRIT, BLOOD
HCT: 33.3 % — ABNORMAL LOW (ref 36.0–46.0)
Hemoglobin: 10.4 g/dL — ABNORMAL LOW (ref 12.0–15.0)

## 2014-05-25 NOTE — Progress Notes (Signed)
Subjective: 1 Day Post-Op Procedure(s) (LRB): RIGHT SHOULDER REVERSE ARTHROPLASTY (Right) Patient reports pain as 2 on 0-10 scale.No Post-Op problems    Objective: Vital signs in last 24 hours: Temp:  [97.1 F (36.2 C)-97.8 F (36.6 C)] 97.7 F (36.5 C) (03/12 0546) Pulse Rate:  [63-92] 73 (03/12 0546) Resp:  [11-20] 20 (03/12 0546) BP: (89-141)/(59-93) 112/84 mmHg (03/12 0546) SpO2:  [96 %-100 %] 98 % (03/12 0546)  Intake/Output from previous day: 03/11 0701 - 03/12 0700 In: 1550 [I.V.:1550] Out: 500 [Urine:400; Blood:100] Intake/Output this shift:     Recent Labs  05/25/14 0650  HGB 10.4*    Recent Labs  05/25/14 0650  HCT 33.3*   No results for input(s): NA, K, CL, CO2, BUN, CREATININE, GLUCOSE, CALCIUM in the last 72 hours. No results for input(s): LABPT, INR in the last 72 hours.  Neurologically intact  Assessment/Plan: 1 Day Post-Op Procedure(s) (LRB): RIGHT SHOULDER REVERSE ARTHROPLASTY (Right) Up with therapy  Ezana Hubbert A 05/25/2014, 7:55 AM

## 2014-05-25 NOTE — Op Note (Signed)
NAMEMarland Kitchen  [place], SCHEIDERER NO.:  [id number]  MEDICAL RECORD NO.:  [id number]  LOCATION:  5N31C                        FACILITY:  [place]  PHYSICIAN:  Doran Heater. Veverly Fells, M.D. 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  PROCEDURE PERFORMED:  Right shoulder reverse total shoulder arthroplasty using DePuy Delta Xtend prosthesis.  SURGEON:  Doran Heater. Veverly Fells, MD.  ASSISTANT:  Abbott Pao. Dixon, PA-C, who was scrubbed during the entire procedure and necessary for satisfactory completion of surgery.  ANESTHESIA:  General anesthesia was used plus interscalene block.  ESTIMATED BLOOD LOSS:  100 mL.  FLUID REPLACEMENT:  1200 mL crystalloid.  INSTRUMENT COUNTS:  Correct.  COMPLICATIONS:  There were no complications.  ANTIBIOTICS:  Perioperative antibiotics given.  INDICATIONS:  The patient is a 74 year old female with progressively worsening right shoulder pain secondary to rotator cuff insufficiency and rotator cuff tear arthropathy.  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  The patient presents now having failed conservative management desiring operative treatment to restore function and eliminate pain by reverse shoulder arthroplasty.  Risks and benefits discussed, informed consent obtained.  DESCRIPTION OF PROCEDURE:  After an adequate level of anesthesia achieved, the patient was positioned in the modified beach-chair position.  Right shoulder correctly identified, sterilely prepped and draped in the usual manner.  Time-out was  called.  Deltopectoral incision was initiated starting at the coracoid process extending down to the anterior humerus.  Dissection down through subcutaneous tissues using Bovie.  Identified the cephalic vein and took it laterally to the deltoid, pectoralis taken medially.  Conjoint tendon identified and retracted.  The remnant of the subscapularis taken off the lesser tuberosity.  This was not repairable, but was used for retraction to protect the axillary nerve.  Basically, this was completely attenuated and there was no appreciable muscle present, more likely pseudocapsule. With that removed and the humeral capsular release performed, we were able to extend the shoulder and deliver the humeral head out of the wound.  We placed a 6 mm, then 8 and 10 mm reamer intramedullary in the humerus.  We then went ahead and did our reaming for the metaphyseal preparation, and removed excess bone spurs.  We noted the size to be a size 10 stem and a size 1 right central metaphyseal component.  We impacted that trial component into place and then left it in to protect the bone.  We retracted the humerus posteriorly and then did a 360- degree capsular removal from the shoulder protecting the axillary nerve and noted there to be no supraspinatus or infraspinatus.  There was a little teres minor present at the back.  We really did not mess with that as it did not feel excessively tight.  Once we had our exposure, we placed our central guide pin for reaming.  We then reamed for the metaglene and  just did a minimal reaming keeping subchondral bone intact, paying a special attention towards slight inferior tilt and appropriate version, and getting that central peg into the scapula centrally.  We then went ahead and after we had reamed, we did our peripheral hand reamer, then we drilled out the central peg hole.  We then impacted the metaglene into position paying attention at 6 o'clock and 12 o'clock  positions.  We then placed a 30 lock screw inferiorly, a 24 lock screw into the base of the coracoid, and an 18 non-lock anteriorly.  We had good purchase with those screws.  We felt like we had a very stable implant.  We then placed a 38 standard glenosphere in place and screwed that into position and impacted it.  We were careful to check the axillary nerve and make sure it was free and clear.  We then went ahead and reduced the shoulder with 38+ 3 trial poly, we were pleased with the soft tissue balancing,  had a nice tight snap when the shoulder reduced and did not have any lack of range of motion with movement.  Excursion was excellent.  We had negative sulcus, negative gaping with external rotation and tight conjoint.  We removed the trial components from the humeral side.  We irrigated the canal thoroughly and then used impaction grafting technique to impact HA coated press-fit stem from the DePuy Delta Xtend prosthesis 10 body, size 1 central, right metaphysis set on 0, and we had placed the stem in 25-30 degrees of retroversion.  Once that was impacted into position, we had nice secure fit.  We selected our 38+ 6 poly, impacted that in position, reduced the shoulder with nice pop.  Again, excellent range of motion and good stability.  We irrigated thoroughly, clipped where the remaining __________ capsule/subscap and then went ahead and closed deltopectoral interval with 0 Vicryl suture followed by 2-0 Vicryl subcutaneous closure, 4-0 Monocryl for skin.  Steri-Strips applied followed by sterile dressing.  The patient tolerated the surgery well.     Doran Heater. Veverly Fells, M.D.     SRN/MEDQ  D:  05/24/2014  T:  05/25/2014  Job:  [id number]

## 2014-05-25 NOTE — Evaluation (Signed)
Occupational Therapy Evaluation Patient Details Name: Glenda Terry MRN: [id number] DOB: Oct 20, 1940 Today's Date: 05/25/2014    History of Present Illness 74 y.o. s/p Rt reverse TSA.   Clinical Impression   Pt s/p above. Pt lives alone and plans to go to SNF for rehab. Feel pt will benefit from OT to increase independence and address RUE prior to d/c.     Follow Up Recommendations  SNF    Equipment Recommendations  Other (comment) (defer to next venue)    Recommendations for Other Services       Precautions / Restrictions Precautions Precautions: Shoulder;Fall Type of Shoulder Precautions: no shoulder movement; elbow, wrist hand AROM Shoulder Interventions: Shoulder sling/immobilizer;Off for dressing/bathing/exercises Precaution Booklet Issued: Yes (comment) Precaution Comments: educated on shoulder precautions Required Braces or Orthoses: Sling Restrictions Weight Bearing Restrictions: Yes RUE Weight Bearing: Non weight bearing      Mobility Bed Mobility Overal bed mobility: Needs Assistance Bed Mobility: Supine to Sit     Supine to sit: Supervision        Transfers Overall transfer level: Needs assistance Equipment used: None;Straight cane Transfers: Sit to/from Stand Sit to Stand: Min guard              Balance  Min guard for ambulation with straight cane.                                          ADL Overall ADL's : Needs assistance/impaired                         Toilet Transfer: Min guard;Ambulation (cane; bed)           Functional mobility during ADLs: Min guard;Cane General ADL Comments: Educated on ADL information on handout.      Vision  pt wears glasses   Perception     Praxis      Pertinent Vitals/Pain Pain Assessment: 0-10 Pain Score:  (3-4) Pain Location: right shoulder posteriorly Pain Descriptors / Indicators: Sore Pain Intervention(s): Monitored during session;Repositioned  (notified nurse)     Hand Dominance     Extremity/Trunk Assessment Upper Extremity Assessment Upper Extremity Assessment: RUE deficits/detail RUE Deficits / Details: s/p Rt reverse TSA RUE Sensation: decreased light touch   LUE: Pt reports problems with left shoulder as well   Lower Extremity Assessment Lower Extremity Assessment: Overall WFL for tasks assessed       Communication Communication Communication: HOH   Cognition Arousal/Alertness: Awake/alert Behavior During Therapy: WFL for tasks assessed/performed Overall Cognitive Status: No family/caregiver present to determine baseline cognitive functioning                     General Comments       Exercises Exercises: Other exercises;Shoulder Other Exercises Other Exercises: Pt performed approximately 10 reps each of right elbow flexion/extension (AAROM), wrist flexion/extension (AROM)-OT assisted in demonstrating on pt how to do this, and digit composite flexion/extension (AROM) Other Exercises: pt performed neck ROM   Shoulder Instructions Shoulder Instructions Donning/doffing shirt without moving shoulder: Maximal assistance (educated on technique and to use button up shirts) Method for sponge bathing under operated UE: Moderate assistance (educated) Donning/doffing sling/immobilizer: Maximal assistance (educated) Correct positioning of sling/immobilizer: Maximal assistance (educated) ROM for elbow, wrist and digits of operated UE: Moderate assistance Sling wearing schedule (on at all times/off for ADL's): Supervision/safety (educated)  Proper positioning of operated UE when showering: Minimal assistance (educated) Positioning of UE while sleeping:  (educated-feel pt could direct caregiver)    [place] expects to be discharged to:: Skilled nursing facility Living Arrangements: Alone                               Additional Comments: pt has a straight cane      Prior  Functioning/Environment Level of Independence: Needs assistance    ADL's / Homemaking Assistance Needed: reports aide helps with bathing/dressing        OT Diagnosis: Acute pain   OT Problem List: Decreased range of motion;Decreased activity tolerance;Decreased knowledge of use of DME or AE;Decreased knowledge of precautions;Impaired sensation;Cardiopulmonary status limiting activity;Impaired UE functional use;Pain;Decreased strength   OT Treatment/Interventions: Self-care/ADL training;Therapeutic exercise;DME and/or AE instruction;Therapeutic activities;Cognitive remediation/compensation;Patient/family education;Balance training    OT Goals(Current goals can be found in the care plan section) Acute Rehab OT Goals Patient Stated Goal: not stated OT Goal Formulation: With patient Time For Goal Achievement: 06/01/14 Potential to Achieve Goals: Good ADL Goals Pt Will Transfer to Toilet: ambulating;with supervision (cane) Additional ADL Goal #1: Pt will independently verbalize and maintain all shoulder precautions during functional activities and ADLs. Additional ADL Goal #2: Pt will independently perform HEP for right elbow, wrist, hand and neck ROM.  OT Frequency: Min 2X/week   Barriers to D/C:            Co-evaluation              End of Session Equipment Utilized During Treatment: Gait belt;Other (comment) (cane and sling) Nurse Communication: Other (comment) (pain level; ice and sanitize O2 cannula; pt wheezing)  Activity Tolerance: Patient tolerated treatment well Patient left: in chair;with call bell/phone within reach   Time: 0851-0912 OT Time Calculation (min): 21 min Charges:  OT General Charges $OT Visit: 1 Procedure OT Evaluation $Initial OT Evaluation Tier I: 1 Procedure G-CodesBenito Mccreedy OTR/L [id number] 05/25/2014, 10:15 AM

## 2014-05-26 NOTE — Clinical Social Work Psychosocial (Signed)
Clinical Social Work Department BRIEF PSYCHOSOCIAL ASSESSMENT 05/26/2014  Patient:  Glenda Terry, Glenda Terry     Account Number:  [id number]     Admit date:  05/24/2014  Clinical Social Worker:  Hubert Azure  Date/Time:  05/26/2014 03:28 PM  Referred by:  Physician  Date Referred:  05/26/2014 Referred for  SNF Placement   Other Referral:   Interview type:  Patient Other interview type:    PSYCHOSOCIAL DATA Living Status:  OTHER Admitted from facility:   Level of care:  Independent Living Primary support name:  Jola Schmidt ([phone]) Primary support relationship to patient:  CHILD, ADULT Degree of support available:   Good    CURRENT CONCERNS Current Concerns  Post-Acute Placement   Other Concerns:    SOCIAL WORK ASSESSMENT / PLAN CSW met with patient who was alert and oriented. CSW introduced self and explained role. CSW explained SNF placement process and discussed d/c plan. Per patient she lies at home alone and feels as if she needs rehab to assist her with "learning how to do my adl's with this shoulder." Patient states she lives in a senior community and needs some help as she does not feel comfortable returning home. Patient further states she was informed by MD that she would need rehab for 5 days. Patient prefers [facility] as it contracts with her insurance and is closer to her home. Patient is also agreeable to other SNF if [facility] is not available.   Assessment/plan status:  Other - See comment Other assessment/ plan:   CSW to submit PASARR and complete FL2 for placement.   Information/referral to community resources:    PATIENT'S/FAMILY'S RESPONSE TO PLAN OF CARE: Patient is adamant about going to rehab and insists "it is going to help me with learning how to perform adl's."    [place], [place] Weekend Clinical Social Worker [phone]

## 2014-05-26 NOTE — Clinical Social Work Placement (Addendum)
Clinical Social Work Department CLINICAL SOCIAL WORK PLACEMENT NOTE 05/26/2014  Patient:  Glenda Terry, Glenda Terry  Account Number:  [id number] Admit date:  05/24/2014  Clinical Social Worker:  Carrington Clamp, LCSWA  Date/time:  05/26/2014 03:26 PM  Clinical Social Work is seeking post-discharge placement for this patient at the following level of care:   [place]   (*CSW will update this form in Epic as items are completed)   05/26/2014  Patient/family provided with [place] Department of Clinical Social Work's list of facilities offering this level of care within the geographic area requested by the patient (or if unable, by the patient's family).  05/26/2014  Patient/family informed of their freedom to choose among providers that offer the needed level of care, that participate in Medicare, Medicaid or managed care program needed by the patient, have an available bed and are willing to accept the patient.  05/26/2014  Patient/family informed of [facility]' ownership interest in [facility], as well as of the fact that they are under no obligation to receive care at this facility.  PASARR submitted to EDS on Existing PASARR number received on Existing  FL2 transmitted to all facilities in geographic area requested by pt/family on  05/26/2014 FL2 transmitted to all facilities within larger geographic area on 05/26/2014  Patient informed that his/her managed care company has contracts with or will negotiate with  certain facilities, including the following:     Patient/family informed of bed offers received:  05/27/2014 [name], [name] Patient chooses bed at Patient to discharge home [name], [name]) Physician recommends and patient chooses bed at    Patient to be transferred to  on  Patient to discharge home [name], [name]) Patient to be transferred to facility by Patient to discharge home [name], [name]) Patient and family  notified of transfer on Patient to discharge home [name], [name]) Name of family member notified:  Patient to discharge home [name], [name]  The following physician request were entered in Epic:   Additional Comments:  Carrington Clamp, [facility] Weekend Clinical Social Worker [phone]  [name], [name] ([phone]) Licensed Clinical Social Worker Orthopedics [phone]) and Surgical [phone])

## 2014-05-26 NOTE — Progress Notes (Signed)
Subjective: 2 Days Post-Op Procedure(s) (LRB): RIGHT SHOULDER REVERSE ARTHROPLASTY (Right) Patient reports pain as mild to right shoulder.  Tolerating PO's. Working well with OT. Denies SOB, CP, no numbness or tingling. Reports she is ready to D/c , but is expecting to go to SNF. We will discuss with CWS for recommendations.   Objective: Vital signs in last 24 hours: Temp:  [97.8 F (36.6 C)-98 F (36.7 C)] 97.9 F (36.6 C) (03/13 0553) Pulse Rate:  [66-89] 73 (03/13 0944) Resp:  [18-20] 20 (03/13 0553) BP: (109-129)/(62-73) 114/62 mmHg (03/13 0944) SpO2:  [96 %-98 %] 96 % (03/13 0553)  Intake/Output from previous day:   Intake/Output this shift: Total I/O In: 240 [P.O.:240] Out: -    Recent Labs  05/25/14 0650  HGB 10.4*    Recent Labs  05/25/14 0650  HCT 33.3*    Recent Labs  05/25/14 0650  NA 135  K 4.2  CL 99  CO2 25  BUN 12  CREATININE 1.03  GLUCOSE 131*  CALCIUM 9.3   No results for input(s): LABPT, INR in the last 72 hours.  Well nourished. Alert and oriented x3. RRR, Lungs clear, BS x4. Abdomen soft and non tender. Right Calf soft and non tender. Right shoulder dressing C/D/I. No DVT signs. Compartment soft. No signs of infection.  Right UE neurovascular intact. Sling in place.  Assessment/Plan: 2 Days Post-Op Procedure(s) (LRB): RIGHT SHOULDER REVERSE ARTHROPLASTY (Right) Continue OT PT Eval Plan to D/c  Today, but patient reports she needs SNF Placement. OT recommends SNF D/c when disposition determined, possibly tomorrow. SW consulted Continue current care  Lajean Manes 05/26/2014, 11:42 AM

## 2014-05-26 NOTE — Progress Notes (Addendum)
Occupational Therapy Treatment Patient Details Name: Glenda Terry MRN: [id number] DOB: 01-07-1941 Today's Date: 05/26/2014    History of present illness 74 y.o. s/p Rt reverse TSA.   OT comments  Pt able to recall a lot of information OT covered yesterday. Continue to recommend SNF for rehab. Gave pt another shoulder handout as she reports she lost hers.  Follow Up Recommendations  SNF    Equipment Recommendations  Other (comment) (defer to next venue)    Recommendations for Other Services      Precautions / Restrictions Precautions Precautions: Shoulder;Fall Type of Shoulder Precautions: no shoulder movement; elbow, wrist hand AROM Shoulder Interventions: Shoulder sling/immobilizer;Off for dressing/bathing/exercises Precaution Booklet Issued: Yes (comment) Precaution Comments: educated on shoulder precautions Required Braces or Orthoses: Sling Restrictions Weight Bearing Restrictions: Yes RUE Weight Bearing: Non weight bearing       Mobility Bed Mobility               General bed mobility comments: not assessed  Transfers Overall transfer level: Needs assistance Equipment used: None;Straight cane Transfers: Sit to/from Stand Sit to Stand: Min guard;Supervision              Balance    Min guard for ambulation with cane.                               ADL Overall ADL's : Needs assistance/impaired     Grooming: Wash/dry face;Standing;Brushing hair;Minimal assistance;Sitting                   Toilet Transfer: Min guard;Ambulation (cane; chair)           Functional mobility during ADLs: Min guard;Cane General ADL Comments: Reviewed shoulder ADL information. Discussed suggestion for UB clothing to take place of bra.      Vision                     Perception     Praxis      Cognition  Awake/Alert Behavior During Therapy: WFL for tasks assessed/performed Overall Cognitive Status: Within Functional Limits  for tasks assessed                       Extremity/Trunk Assessment               Exercises Other Exercises Other Exercises: Pt performed approximately 10 reps each of right elbow flexion/extension (AROM), wrist flexion/extension (AROM), and digit composite flexion/extension (AROM). Other Exercises: Pt performed neck AROM Donning/doffing shirt without moving shoulder:  (pt able to verbalize which arm goes in first) Method for sponge bathing under operated UE:  (pt able to verbalize) Donning/doffing sling/immobilizer: Moderate assistance Correct positioning of sling/immobilizer: Maximal assistance ROM for elbow, wrist and digits of operated UE: Min-guard Sling wearing schedule (on at all times/off for ADL's): Patient able to independently direct caregiver Proper positioning of operated UE when showering:  (pt able to verbalize- OT explained other position as well) Positioning of UE while sleeping:  (pt able to verbalize position of pillows)   Shoulder Instructions Shoulder Instructions Donning/doffing shirt without moving shoulder:  (pt able to verbalize which arm goes in first) Method for sponge bathing under operated UE:  (pt able to verbalize) Donning/doffing sling/immobilizer: Moderate assistance Correct positioning of sling/immobilizer: Maximal assistance ROM for elbow, wrist and digits of operated UE: Min-guard Sling wearing schedule (on at all times/off for ADL's): Patient able to independently  direct caregiver Proper positioning of operated UE when showering:  (pt able to verbalize- OT explained other position as well) Positioning of UE while sleeping:  (pt able to verbalize position of pillows)     General Comments      Pertinent Vitals/ Pain       Pain Assessment: 0-10 Pain Score:  (4-6) Pain Location: right shoulder Pain Intervention(s): Repositioned;Monitored during session;Ice applied  Home Living                                           Prior Functioning/Environment              Frequency Min 2X/week     Progress Toward Goals  OT Goals(current goals can now be found in the care plan section)  Progress towards OT goals: Progressing toward goals  Acute Rehab OT Goals Patient Stated Goal: not stated OT Goal Formulation: With patient Time For Goal Achievement: 06/01/14 Potential to Achieve Goals: Good ADL Goals Pt Will Transfer to Toilet: ambulating;with supervision (cane) Additional ADL Goal #1: Pt will independently verbalize and maintain all shoulder precautions during functional activities and ADLs. Additional ADL Goal #2: Pt will independently perform HEP for right elbow, wrist, hand and neck ROM.  Plan Discharge plan remains appropriate    Co-evaluation                 End of Session Equipment Utilized During Treatment: Gait belt;Other (comment) (sling)   Activity Tolerance Patient tolerated treatment well   Patient Left in chair;with call bell/phone within reach;Other (comment) (nurse came in as OT left room)   Nurse Communication          Time: [phone] OT Time Calculation (min): 18 min  Charges: OT General Charges $OT Visit: 1 Procedure OT Treatments $Self Care/Home Management : 8-22 mins  Benito Mccreedy OTR/L [phone] 05/26/2014, 9:39 AM

## 2014-05-27 ENCOUNTER — Inpatient Hospital Stay
Admission: RE | Admit: 2014-05-27 | Discharge: 2014-06-03 | Disposition: A | Discharge status: Home / Self Care | Payer: PPO | Source: Ambulatory Visit | Attending: Internal Medicine | Admitting: Internal Medicine

## 2014-05-27 NOTE — Evaluation (Addendum)
Physical Therapy Evaluation Patient Details Name: Glenda Terry MRN: [id number] DOB: 06-03-1940 Today's Date: 05/27/2014   History of Present Illness  74 y.o. s/p Rt reverse TSA.  Clinical Impression  Pt presented at [facility] with irritability, stating she needed help with ADL's (cleaning teeth, brushing hair).  Pt calmed down for PT session once we got her up and moving.  Pt demonstrated ability to walk and transfer with min guard/supervision.  Pt demonstrated fatigue with ambulation >150 feet. Pt would benefit from skilled therapy to address above functional limitations. Pt to benefit from SNF to achieve safe mod I level of function for safe transition home.    Follow Up Recommendations SNF;Supervision/Assistance - 24 hour    Equipment Recommendations  None recommended by PT   Recommendations for Other Services       Precautions / Restrictions Precautions Precautions: Shoulder Type of Shoulder Precautions: no shoulder movement; elbow, wrist hand AROM Shoulder Interventions: Shoulder sling/immobilizer;Off for dressing/bathing/exercises Precaution Comments: pt was seen in sling and knew not to try to do ADL's with R arm Required Braces or Orthoses: Sling Restrictions Weight Bearing Restrictions: Yes RUE Weight Bearing: Non weight bearing      Mobility  Bed Mobility Overal bed mobility:  (Pt received sitting at EOB)             General bed mobility comments: not assessed  Transfers Overall transfer level: Needs assistance Equipment used: Straight cane Transfers: Sit to/from Stand Sit to Stand: Supervision         General transfer comment: Able to transfer I, but for safety recommend supervision when getting up  Ambulation/Gait Ambulation/Gait assistance: Modified independent (Device/Increase time) Ambulation Distance (Feet): 150 Feet Assistive device: Straight cane Gait Pattern/deviations: Step-through pattern;Decreased step length - right;Decreased step length  - left Gait velocity: Extremely Slow   General Gait Details: Pt demonstrated knowledge of cane L hand  Stairs            Wheelchair Mobility    Modified Rankin (Stroke Patients Only)       Balance Overall balance assessment: Needs assistance         Standing balance support: Single extremity supported Standing balance-Leahy Scale: Poor Standing balance comment: Pt tolerated ambulation with use of cane, but still demonstrated postural fatigue during ambulation                             Pertinent Vitals/Pain Pain Assessment: No/denies pain Faces Pain Scale: Hurts little more Pain Location: R shoulder Pain Descriptors / Indicators: Aching Pain Intervention(s): Monitored during session    Home Living Family/patient expects to be discharged to:: Skilled nursing facility Living Arrangements: Alone                    Prior Function Level of Independence: Independent               Hand Dominance        Extremity/Trunk Assessment   Upper Extremity Assessment: RUE deficits/detail RUE Deficits / Details: s/p Rt reverse TSA         Lower Extremity Assessment: Generalized weakness (Decreased endurance with prolonged ambulation)      Cervical / Trunk Assessment: Kyphotic  Communication   Communication: No difficulties  Cognition Arousal/Alertness: Awake/alert Behavior During Therapy: WFL for tasks assessed/performed Overall Cognitive Status: Within Functional Limits for tasks assessed  General Comments      Exercises Other Exercises Other Exercises: Pt performed approximately 10 reps each of right elbow flexion/extension (AROM), wrist flexion/extension (AROM), and digit composite flexion/extension (AROM). Donning/doffing sling/immobilizer: Moderate assistance Correct positioning of sling/immobilizer: Maximal assistance ROM for elbow, wrist and digits of operated UE: Min-guard       Assessment/Plan    PT Assessment Patient needs continued PT services  PT Diagnosis Generalized weakness   PT Problem List Decreased strength;Decreased activity tolerance;Decreased balance;Decreased mobility  PT Treatment Interventions Functional mobility training;Therapeutic activities;Therapeutic exercise;Balance training;Gait training;DME instruction   PT Goals (Current goals can be found in the Care Plan section) Acute Rehab PT Goals Patient Stated Goal: not stated PT Goal Formulation: With patient Time For Goal Achievement: 06/10/14 Potential to Achieve Goals: Good    Frequency Min 4X/week   Barriers to discharge Decreased caregiver support lives alone    Co-evaluation               End of Session Equipment Utilized During Treatment: Gait belt (Sling, Cane) Activity Tolerance: Patient tolerated treatment well Patient left: in chair;with call bell/phone within reach;with family/visitor present Nurse Communication: Mobility status         Time: 0921-0943 PT Time Calculation (min) (ACUTE ONLY): 22 min   Charges:   PT Evaluation $Initial PT Evaluation Tier I: 1 Procedure     PT G Codes:        Karst,Kailee 06/20/2014, 12:43 PM  Lucas Mallow, SPT (student physical therapist) Office phone: [phone]  Agree with above assessment.  Kittie Plater, PT, DPT Pager #: [phone] Office #: [phone]

## 2014-05-27 NOTE — Clinical Social Work Note (Signed)
Patient accepted SNF bed offer with [facility]. CSW received call from admissions liaison from [facility] stating patient owed facility $650 from previous stay at [facility] and patient would be required to pay full amount prior to returning. [place] contacted patient regarding information above. Per admissions liaison, patient currently refusing to pay $650 to [facility] and patient stated patient will be discharging home at time of discharge.  CSW updated RNCM regarding information above.  [name], [place] ([phone]) Licensed Clinical Social Worker Orthopedics [phone]) and Surgical [phone])

## 2014-05-27 NOTE — Progress Notes (Signed)
   Subjective: 3 Days Post-Op Procedure(s) (LRB): RIGHT SHOULDER REVERSE ARTHROPLASTY (Right)  Pt sitting up comfortably in no acute distress Mild soreness in shoulder Sling in place Therapy has gone well Plan for rehab today Patient reports pain as mild.  Objective:   VITALS:   Filed Vitals:   05/27/14 0504  BP: 124/71  Pulse: 81  Temp: 98.2 F (36.8 C)  Resp: 16    Right shoulder dressing and sling intact  nv intact distally No rashes or edema  LABS  Recent Labs  05/25/14 0650  HGB 10.4*  HCT 33.3*     Recent Labs  05/25/14 0650  NA 135  K 4.2  BUN 12  CREATININE 1.03  GLUCOSE 131*     Assessment/Plan: 3 Days Post-Op Procedure(s) (LRB): RIGHT SHOULDER REVERSE ARTHROPLASTY (Right) D/c to rehab today F/u in 2 weeks      Merla Riches, MPAS, PA-C  05/27/2014, 9:12 AM

## 2014-05-27 NOTE — Discharge Summary (Signed)
Physician Discharge Summary   Patient ID: Glenda Terry MRN: [id number] DOB/AGE: 12/07/1940 74 y.o.  Admit date: 05/24/2014 Discharge date: 05/27/2014  Admission Diagnoses:  Active Problems:   S/P shoulder replacement   Discharge Diagnoses:  Same   Surgeries: Procedure(s): RIGHT SHOULDER REVERSE ARTHROPLASTY on 05/24/2014   Consultants: PT/OT  Discharged Condition: Stable  Hospital Course: Glenda Terry is an 74 y.o. female who was admitted 05/24/2014 with a chief complaint of No chief complaint on file. , and found to have a diagnosis of <principal problem not specified>.  They were brought to the operating room on 05/24/2014 and underwent the above named procedures.    The patient had an uncomplicated hospital course and was stable for discharge.  Recent vital signs:  Filed Vitals:   05/27/14 0504  BP: 124/71  Pulse: 81  Temp: 98.2 F (36.8 C)  Resp: 16    Recent laboratory studies:  Results for orders placed or performed during the hospital encounter of 05/24/14  Hemoglobin and hematocrit, blood  Result Value Ref Range   Hemoglobin 10.4 (L) 12.0 - 15.0 g/dL   HCT 33.3 (L) 36.0 - 78.9 %  Basic metabolic panel  Result Value Ref Range   Sodium 135 135 - 145 mmol/L   Potassium 4.2 3.5 - 5.1 mmol/L   Chloride 99 96 - 112 mmol/L   CO2 25 19 - 32 mmol/L   Glucose, Bld 131 (H) 70 - 99 mg/dL   BUN 12 6 - 23 mg/dL   Creatinine, Ser 1.03 0.50 - 1.10 mg/dL   Calcium 9.3 8.4 - 10.5 mg/dL   GFR calc non Af Amer 53 (L) >90 mL/min   GFR calc Af Amer 62 (L) >90 mL/min   Anion gap 11 5 - 15    Discharge Medications:     Medication List    TAKE these medications        ALPRAZolam 0.5 MG tablet  Commonly known as:  XANAX  Take 0.5 mg by mouth 4 (four) times daily as needed for anxiety. For anxiety     beclomethasone 40 MCG/ACT inhaler  Commonly known as:  QVAR  Inhale 2 puffs into the lungs 2 (two) times daily.     clotrimazole 1 % cream  Commonly known  as:  LOTRIMIN  Apply to affected area 2 times daily     diphenhydrAMINE 25 MG tablet  Commonly known as:  BENADRYL  Take 25 mg by mouth at bedtime as needed. For allergies     docusate sodium 100 MG capsule  Commonly known as:  COLACE  Take 100 mg by mouth daily.     EPIPEN 2-PAK 0.3 mg/0.3 mL Devi  Generic drug:  EPINEPHrine  Inject 0.3 mg into the muscle once. As needed for tree nut allergy     esomeprazole 40 MG capsule  Commonly known as:  NEXIUM  Take 40 mg by mouth daily before breakfast.     folic acid 1 MG tablet  Commonly known as:  FOLVITE  Take 1 mg by mouth daily.     HYDROcodone-acetaminophen 5-325 MG per tablet  Commonly known as:  NORCO/VICODIN  1 tab PO q12 hours prn pain     HYDROcodone-acetaminophen 7.5-325 MG per tablet  Commonly known as:  NORCO  Take 1 tablet by mouth every 6 (six) hours as needed for moderate pain.     loratadine 10 MG tablet  Commonly known as:  CLARITIN  Take 10 mg by mouth daily.  methocarbamol 500 MG tablet  Commonly known as:  ROBAXIN  Take 1 tablet (500 mg total) by mouth 3 (three) times daily as needed.     ondansetron 4 MG disintegrating tablet  Commonly known as:  ZOFRAN ODT  4mg  ODT q4 hours prn nausea/vomit     oxyCODONE-acetaminophen 5-325 MG per tablet  Commonly known as:  PERCOCET/ROXICET  Take 1 tablet by mouth every 6 (six) hours as needed for moderate pain.     pantoprazole 40 MG tablet  Commonly known as:  PROTONIX  Take 40 mg by mouth daily.     PROAIR HFA 108 (90 BASE) MCG/ACT inhaler  Generic drug:  albuterol  Inhale 2 puffs into the lungs every 6 (six) hours as needed for wheezing or shortness of breath. For COPD     telmisartan-hydrochlorothiazide 80-12.5 MG per tablet  Commonly known as:  MICARDIS HCT  Take 1 tablet by mouth daily.     traMADol 50 MG tablet  Commonly known as:  ULTRAM  Take 1 tablet (50 mg total) by mouth every 6 (six) hours as needed.        Diagnostic Studies: Dg  Shoulder Right Port  05/24/2014   CLINICAL DATA:  Postop from right shoulder arthroplasty.  EXAM: PORTABLE RIGHT SHOULDER - 2+ VIEW  COMPARISON:  None.  FINDINGS: Right shoulder prosthesis is seen in expected position. No evidence of fracture or dislocation.  IMPRESSION: Expected postoperative appearance of right shoulder prosthesis.   Electronically Signed   By: Earle Gell M.D.   On: 05/24/2014 13:16   Mm Digital Screening Bilateral  05/03/2014   CLINICAL DATA:  Screening.  EXAM: DIGITAL SCREENING BILATERAL MAMMOGRAM WITH CAD  COMPARISON:  Previous exam(s).  ACR Breast Density Category b: There are scattered areas of fibroglandular density.  FINDINGS: There are no findings suspicious for malignancy. Images were processed with CAD.  IMPRESSION: No mammographic evidence of malignancy. A result letter of this screening mammogram will be mailed directly to the patient.  RECOMMENDATION: Screening mammogram in one year. (Code:[id number]-B-01Y)  BI-RADS CATEGORY  1: Negative.   Electronically Signed   By: Conchita Paris M.D.   On: 05/03/2014 14:12    Disposition: 01-Home or Self Care        Follow-up Information    Follow up with NORRIS,STEVEN R, MD. Call in 2 weeks.   Specialty:  Orthopedic Surgery   Why:  [phone]   Contact information:   [address] [place] [address] [phone]        Signed: Ventura Bruns 05/27/2014, 9:14 AM

## 2014-05-27 NOTE — Progress Notes (Signed)
Occupational Therapy Treatment Patient Details Name: Glenda Terry MRN: [id number] DOB: 1940-10-17 Today's Date: 05/27/2014    History of present illness 74 y.o. s/p Rt reverse TSA.   OT comments  Pt. Able to complete AROM to digits, wrist, and elbow for maintaining ROM and edema management.  Also reviewed proper wearing of sling and positioning on pillows to promote proper elevation of RUE.  Follow Up Recommendations  SNF    Equipment Recommendations       Recommendations for Other Services      Precautions / Restrictions Precautions Precautions: Shoulder Type of Shoulder Precautions: no shoulder movement; elbow, wrist hand AROM Shoulder Interventions: Shoulder sling/immobilizer;Off for dressing/bathing/exercises Precaution Comments: pt was seen in sling and knew not to try to do ADL's with R arm Required Braces or Orthoses: Sling Restrictions Weight Bearing Restrictions: Yes RUE Weight Bearing: Non weight bearing       Mobility Bed Mobility                  Transfers Overall transfer level: Needs assistance Equipment used: Straight cane Transfers: Sit to/from Stand Sit to Stand: Supervision         General transfer comment: Able to transfer I, but for safety recommend supervision when getting up    Balance                                   ADL                                                Vision                     Perception     Praxis      Cognition   Behavior During Therapy: [facility] for tasks assessed/performed Overall Cognitive Status: Within Functional Limits for tasks assessed                       Extremity/Trunk Assessment               Exercises Other Exercises Other Exercises: Pt performed approximately 10 reps each of right elbow flexion/extension (AROM), wrist flexion/extension (AROM), and digit composite flexion/extension (AROM). Donning/doffing sling/immobilizer:  Moderate assistance Correct positioning of sling/immobilizer: Maximal assistance ROM for elbow, wrist and digits of operated UE: Min-guard   Shoulder Instructions Shoulder Instructions Donning/doffing sling/immobilizer: Moderate assistance Correct positioning of sling/immobilizer: Maximal assistance ROM for elbow, wrist and digits of operated UE: Min-guard     General Comments      Pertinent Vitals/ Pain       Pain Assessment: No/denies pain Faces Pain Scale: Hurts little more Pain Location: R shoulder Pain Descriptors / Indicators: Aching Pain Intervention(s): Monitored during session  Home Living                                          Prior Functioning/Environment              Frequency Min 2X/week     Progress Toward Goals  OT Goals(current goals can now be found in the care plan section)  Progress towards OT goals: Progressing toward goals  Acute Rehab  OT Goals Patient Stated Goal: not stated  Plan Discharge plan remains appropriate    Co-evaluation                 End of Session     Activity Tolerance Patient tolerated treatment well   Patient Left in chair;with call bell/phone within reach   Nurse Communication          Time: 0955-1007 OT Time Calculation (min): 12 min  Charges: OT General Charges $OT Visit: 1 Procedure OT Treatments $Therapeutic Exercise: 8-22 mins  Janice Coffin, COTA/L 05/27/2014, 10:14 AM

## 2014-05-28 ENCOUNTER — Encounter (HOSPITAL_COMMUNITY): Payer: Self-pay | Admitting: Orthopedic Surgery

## 2014-05-28 ENCOUNTER — Other Ambulatory Visit: Payer: Self-pay | Admitting: *Deleted

## 2014-05-28 ENCOUNTER — Non-Acute Institutional Stay (SKILLED_NURSING_FACILITY): Payer: PPO | Admitting: Internal Medicine

## 2014-05-28 DIAGNOSIS — I10 Essential (primary) hypertension: Secondary | ICD-10-CM | POA: Diagnosis not present

## 2014-05-28 DIAGNOSIS — Z96611 Presence of right artificial shoulder joint: Secondary | ICD-10-CM

## 2014-05-28 DIAGNOSIS — K219 Gastro-esophageal reflux disease without esophagitis: Secondary | ICD-10-CM

## 2014-05-28 DIAGNOSIS — R21 Rash and other nonspecific skin eruption: Secondary | ICD-10-CM

## 2014-05-28 MED ORDER — TRAMADOL HCL 50 MG PO TABS: ORAL_TABLET | ORAL | Status: DC

## 2014-05-28 NOTE — Telephone Encounter (Signed)
[facility] 

## 2014-05-28 NOTE — Progress Notes (Signed)
Patient ID: Glenda Terry, female   DOB: March 05, 1941, 74 y.o.   MRN: [id number]   This is an acute visit.  Level care skilled.  Facility [facility].  Chief complaint-acute visit status post right shoulder arthroplasty.--Follow-up rash  History of present illness.  Patient is a pleasant 74 year old female who is here for rehabilitation after going to going a right shoulder arthroplasty secondary to chronic pain she had end-stage arthritic changes.  Apparently she tolerated the procedure without significant complication and she is here for rehabilitation.  She is on Percocet every 6 hours as needed for pain per nursing this is not totally adequate we will changes to every 4 hours and monitor.  Patient also has developed a rash of the right axilla shoulder back area-this is somewhat pinpoint macular papular almost pimple-appearing-she does not complain of any pain with this rash however it is itching significantly-she denies any prior history of a rash in this area.  Her vital signs are stable she does not complain of any shortness of breath chest tightening or respiratory discomfort.  Previous medical history.  Right shoulder arthritis status post arthroplasty.  COPD.  History chronic back pain.  Anxiety.  Hypertension.  GERD.  Inflammatory arthritis.  Anemia.  Tubular adenoma.  Past surgical history.  Hysterectomy.  Tubal ligation.  Abdominal hysterectomy.  Knee arthroscopic.  Total knee arthroscopic on the right area  Depressive lumbar laminectomy.  Carpal tunnel release.  Social history patient quit smoking about 22 years ago it was cigarette she has an 80-pack-year smoking history no use of smokeless tobacco or significant alcohol or illicit drug use.  Medications.  Xanax 0.5 mg 4 times a day when necessary.  Qvar inhaler 2 puffs twice a day.  Lotrimin 1% cream twice a day.  Benadryl 25 mg at bedtime when necessary for allergies.  Colace  100 mg daily.  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  Nexium 40 mg by mouth this has been switched to Prilosec 20 mg daily.  Folic acid 1 mg daily.  Percocet 5325 mg 1 tab every 4 hours when necessary pain  Robaxin 500 mg 3 times a day when necessary.  Claritin 10 mg by mouth every morning.  MiCardis HCT 80-12 0.5 daily  Pro Air inhaler and inhale 2 puffs every 6 hours when necessary wheezing or shortness of breath  Review of systems.  In general does not complain of any fever or chills.  Skin is complaining of a very itchy rash right axilla area small part of upper back.  Eyes does not complain of visual changes or eye discomfort or discharge.  Nose does not complain of drainage.  Chest is not complaining of any shortness breath or cough.  Cardiac not complaining of chest pain has mild lower extremity edema.  GI does not complain of abdominal discomfort nausea or vomiting.  GU does not complain of dysuria.  Musculoskeletal has complained of some right shoulder discomfort again we have adjusted her Percocet for more frequent dosing this appears to be somewhat improved this evening.  Neurologic does not complain of dizziness headache numbness or syncopal-type episodes.  Psych appears to have some history of anxiety otherwise no complaints.  Physical exam.  Temperature is 98.8 pulse 100 respirations 20 blood pressure 104/61.  In general this is a pleasant elderly female in no distress sitting on side of her bed.  Her skin is warm and dry she does have a rash of her right axilla extending to her  lateral right breast also small part of the right back this appears to be somewhat macular papular almost pimple-appearing-there is no drainage or bleeding.  Eyes pupils appear reactive light sclera and conjunctiva are clear visual acuity appears grossly intact.  Oropharynx clear mucous membranes moist.  Chest is clear to auscultation there is no labored  breathing.  Heart is regular rate and rhythm borderline tachycardic without murmur gallop or rub she has mild lower extremity edema.  Abdomen soft obese nontender positive bowel sounds.  Muscle skeletal does have her right arm in a sling radial pulses intact capillary refill intact grip strength appears to be intact there is bandaging over the surgical site anterior shoulder area there is also some mild bruising right upper arm I do not note any deformity.  Otherwise moves her extremities at baseline ambulates well any wheelchair.  Neurologic is grossly intact her speech is clear I could not really appreciate lateralizing findings.  Psych she is alert and oriented pleasant and appropriate labs.  05/24/2013.  Hemoglobin 10.4.  Sodium 135 potassium 4.2 BUN 12 creatinine 1.03.  05/14/2014.  WBC 7.4 hemoglobin 11.5 platelets 266.  Assessment and plan.  #1-history of right shoulder repair-again we have adjusted her pain medication to Percocet 5325 mg one tablet every 4 hours when necessary at this point monitor-she is followed by orthopedics she also has Robaxin as needed for muscle spasms.  #2-history of rash axilla area-she does have an order for Benadryl when necessary at night and Claritin routinely in the morning-at this point will hold both of these in start Benadryl 25 mg every 8 hours when necessary more for itching and the rash-also will add triamcinolone cream 0.1% twice a day to see if this will give some relief-this will have to be monitored closely -- Dr. Dellia Nims will be in the facility tomorrow for follow-up.  #3 history COPD this appears to be stable on current medications including Qvar inhaler as well as pro-air.  #4-hypertension again we have minimal reading so far appears to be fairly satisfactory she is on Micardis-hct  #5-history of GERD-she has been put on Prilosec suspect secondary to pharmacy recommendation she had been on Protonix at home she does have a history  of GERD and esophageal issues per chart review with a small hiatal hernia and noncritical Schatzki ring pulse dilation-this will have to be monitored per discussion with her she had been on Nexium but there were insurance issues and switch to Protonix-she thinks she has done well with Prilosec as well again will have to watch this.  #5-anxiety again she does continue on Xanax 4 times a day as needed this appears to be a fairly persistent issue.  CPT is 99310-of note greater than 40 minutes spent assessing patient-discussing her status with family as well as with nursing staff-reviewing her chart-and coordinating and formulating a plan of care for numerous diagnoses-of note greater than 50% of time spent coordinating plan of care          .

## 2014-05-30 ENCOUNTER — Non-Acute Institutional Stay (SKILLED_NURSING_FACILITY): Payer: PPO | Admitting: Internal Medicine

## 2014-05-30 DIAGNOSIS — B029 Zoster without complications: Secondary | ICD-10-CM

## 2014-05-30 DIAGNOSIS — Z96611 Presence of right artificial shoulder joint: Secondary | ICD-10-CM | POA: Diagnosis not present

## 2014-05-30 DIAGNOSIS — J449 Chronic obstructive pulmonary disease, unspecified: Secondary | ICD-10-CM | POA: Diagnosis not present

## 2014-05-31 ENCOUNTER — Non-Acute Institutional Stay (SKILLED_NURSING_FACILITY): Payer: PPO | Admitting: Internal Medicine

## 2014-05-31 DIAGNOSIS — R21 Rash and other nonspecific skin eruption: Secondary | ICD-10-CM

## 2014-05-31 DIAGNOSIS — Z96611 Presence of right artificial shoulder joint: Secondary | ICD-10-CM | POA: Diagnosis not present

## 2014-05-31 DIAGNOSIS — I10 Essential (primary) hypertension: Secondary | ICD-10-CM

## 2014-05-31 NOTE — Progress Notes (Signed)
Patient ID: Glenda Terry, female   DOB: February 21, 1941, 74 y.o.   MRN: [id number]   This is an acute visit.  Level care skilled.  Facility [facility].  Chief complaint-acute visit follow-up rash-  History of present illness.  Patient is a pleasant 74 year old female who is here for rehabilitation after going to going a right shoulder arthroplasty secondary to chronic pain she had end-stage arthritic changes.  Apparently she tolerated the procedure without significant complication and she is here for rehabilitation.  .  Patient also has developed a rash of the right axilla shoulder back area-this iwassomewhat pinpoint macular papular almost pimple-appearing-she does not complain of any pain with this rash however it iwas itching significantly-she denies any prior history of a rash in this area.  Initially she was started on a topical steroid as well as Benadryl for the itching-Dr. Dellia Nims saw her the next day and also started Famvir for concerns this possibly may be herpesZosterr situation-nursing was concerned earlier today thinking the rash was not improving and she was itching-however when I evaluated it this afternoon rash actually appeared to be improved there was less itching patient appeared to be more comfortable-says the shoulder area in general feels significantly better  I also noted per chart review at times she will have somewhat low blood pressures listed as 96/67 today the highest one that I see is 123/50 also see 104/61 which appears to be baseline she does not complain of any dizziness headache palpitations-she is mildly tachycardic at times it just over 100.-- I took the blood pressure manually this afternoon and got 108/68  She does not complain of any chest pain or shortness of breath either  She is on Micardis 80-12 0.5 mg once a day   Previous medical history.  Right shoulder arthritis status post arthroplasty.  COPD.  History chronic back  pain.  Anxiety.  Hypertension.  GERD.  Inflammatory arthritis.  Anemia.  Tubular adenoma.  Past surgical history.  Hysterectomy.  Tubal ligation.  Abdominal hysterectomy.  Knee arthroscopic.  Total knee arthroscopic on the right area  Depressive lumbar laminectomy.  Carpal tunnel release.  Social history patient quit smoking about 22 years ago it was cigarette she has an 80-pack-year smoking history no use of smokeless tobacco or significant alcohol or illicit drug use.  Medications.  Xanax 0.5 mg 4 times a day when necessary.  Qvar inhaler 2 puffs twice a day.  Lotrimin 1% cream twice a day.  Benadryl 25 mg at bedtime when necessary for allergies.  Colace 100 mg daily.  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  Nexium 40 mg by mouth this has been switched to Prilosec 20 mg daily.  Folic acid 1 mg daily.  Percocet 5325 mg 1 tab every 4 hours when necessary pain  Robaxin 500 mg 3 times a day when necessary.  Claritin 10 mg by mouth every morning.  MiCardis HCT 80-12 0.5 daily  Pro Air inhaler and inhale 2 puffs every 6 hours when necessary wheezing or shortness of breath  Review of systems.  In general does not complain of any fever or chills.  Skin --as noted above right axilla back rash appears to be slowly improving  Eyes does not complain of visual changes or eye discomfort or discharge.  Nose does not complain of drainage.  Chest is not complaining of any shortness breath or cough.  Cardiac not complaining of chest pain has mild lower extremity edema.  GI does not complain  of abdominal discomfort nausea or vomiting.  GU does not complain of dysuria.  Musculoskeletal  --Right shoulder discomfort appears to be improving  Neurologic does not complain of dizziness headache numbness or syncopal-type episodes.  Psych appears to have some history of anxiety otherwise no complaints.  Physical exam.  For 97.8 pulse 100 respirations 18  blood pressure 96/67-123/50 in this range.--I took it manually this afternoon and got 108/68  In general this is a pleasant elderly female in no distress s  Her skin is warm and dry she does have a rash of her right axilla extending to her lateral right breast also small part of the right back this appears to be somewhat macular papular almost pimple-appearing-there is no drainage or bleeding--this appears less erythematous than when I saw previously.  Eyes pupils appear reactive light sclera and conjunctiva are clear visual acuity appears grossly intact.  Oropharynx clear mucous membranes moist.  Chest is clear to auscultation there is no labored breathing.  Heart is regular rate and rhythm borderline tachycardic without murmur gallop or rub she has mild lower extremity edema.  Abdomen soft obese nontender positive bowel sounds.  Muscle skeletal does have her right arm in a sling radial pulses intact capillary refill intact grip strength appears to be intact --surgical site appears fairly benign there is well-healed crusting over the incision site Steri-Strips are in place I do not see evidence of increased erythema or drainage or any sign of infection there is also some mild bruising right upper arm I do not note any deformity.  Otherwise moves her extremities at baseline ambulates well any wheelchair.  Neurologic is grossly intact her speech is clear I could not really appreciate lateralizing findings.  Psych she is alert and oriented pleasant and appropriate labs.  05/24/2013.  Hemoglobin 10.4.  Sodium 135 potassium 4.2 BUN 12 creatinine 1.03.  05/14/2014.  WBC 7.4 hemoglobin 11.5 platelets 266.  Assessment and plan.  #1-history of right shoulder repair-again we have adjusted her pain medication to Percocet 5325 mg one tablet every 4 hours when necessary at this point monitor-she is followed by orthopedics she also has Robaxin as needed for muscle spasms--she says the pain is  improved.  #2-history of rash axilla area- This appears to be improving she is completing a course of Famvir on day 3 of 7-she is also receiving triamcinolone cream as well as Benadryl when necessary.  #3 history COPD this appears to be stable on current medications including Qvar inhaler as well as pro-air.  #4-hypertension I do note occasional low systolics--- I do not really see consistent elevations-will reduce her Micardis to 40/12.5 mg and monitor--also will update a metabolic panel keep an eye on her renal function and electrolytes  [id number]        .

## 2014-06-02 NOTE — Progress Notes (Addendum)
Patient ID: Glenda Terry, female   DOB: 12-15-1940, 74 y.o.   MRN: [id number]                 HISTORY & PHYSICAL  DATE:  05/29/2014                FACILITY: [facility]       LEVEL OF CARE:   SNF   CHIEF COMPLAINT:  Admission to SNF, post stay at [facility], 05/24/2014 through 05/27/2014.                               HISTORY OF PRESENT ILLNESS:  This is a patient who underwent a right shoulder reverse arthroplasty.  She was electively admitted.  There does not appear to be any immediate postoperative concerns in the discharge summary.  However, apparently she has developed an intensely pruritic/painful rash over the shoulder and I was asked to look at this.                        PAST MEDICAL HISTORY/PROBLEM LIST:                                 COPD.     Chronic back pain.    Anxiety.    Hypertension.    Gastroesophageal reflux.    Osteoarthritis.  Status post right total knee replacement.     Anemia.    Tubular adenoma.    PAST SURGICAL HISTORY:                             Abdominal hysterectomy.     Total knee arthroplasty.       Lumbar laminectomy.       Colonoscopy.    Esophagogastroduodenoscopy.    Carpal tunnel release.      Reverse shoulder arthroplasty.    CURRENT MEDICATIONS:  Medication list is reviewed.                    Xanax 0.5 four times a day.    QVAR 40 mcg, 2 puffs b.i.d.      Lotrimin to the affected area b.i.d.                         Colace 100 b.i.d.                 Epinephrine p.r.n. for "_____ allergy".        Nexium 40 q.d.                Folic acid 1 mg q.d.     Norco 5/325 q.12 p.r.n.                        Claritin 10 q.d.         Robaxin 500 three times a day p.r.n.    Odansetron 4 mg q.4 p.r.n.                               Protonix 40 q.d.         ProAir/albuterol 2 puffs q.6 hours p.r.n.  Micardis/hydrochlorothiazide 80/12.5 q.d.                  Ultram p.r.n.                                       REVIEW OF SYSTEMS:            CHEST/RESPIRATORY:  No shortness of breath.     CARDIAC:  No chest pain.    MUSCULOSKELETAL:  Complaining of severe pain due to a rash, which apparently has been present for 3-4 days.    Otherwise, she has not complained of left shoulder or bilateral knee pain.       PHYSICAL EXAMINATION:   GENERAL APPEARANCE:  The patient is not in any distress.  Was able to get herself up and dressed today.   CHEST/RESPIRATORY:  Shallow, but otherwise clear air entry.  There is no wheezing.   CARDIOVASCULAR:   CARDIAC:  Heart sounds are normal.  There are no murmurs.   GASTROINTESTINAL:   ABDOMEN:  Nontender.      MUSCULOSKELETAL:    EXTREMITIES:   RIGHT LOWER EXTREMITY:  Right total knee replacement.    CIRCULATION:   EDEMA/VARICOSITIES:  No evidence of a DVT.   SKIN:   INSPECTION:  The area of the rash is actually not over her surgical incision, which is on the anterior shoulder.  It is actually on the lateral aspect of her breasts, extending posteriorly and then curving up towards her scapula.  These are small blisters.  Some of them look like pustules.  I suspect this is actually herpes zoster and really has nothing to do with her surgical adventure.    ASSESSMENT/PLAN:                                            Right shoulder reverse arthroplasty.    I think the incision looks passively well here.  I do not see an issue.    Rash.  This is a bit atypical in the dermatomal distribution, which almost seems broader than one dermatome and curving upwards more than I would expect for a T5-T6 area.  Nevertheless, I suspect this is zoster and this is why it is so uncomfortable.  It may be beyond a timeframe where Famvir would be helpful.   Nevertheless, I will go ahead and start this.    COPD.  This is stable.    Hypertension.  We will monitor while she is here.

## 2014-06-03 ENCOUNTER — Encounter (HOSPITAL_COMMUNITY)
Admission: RE | Admit: 2014-06-03 | Discharge: 2014-06-03 | Disposition: A | Discharge status: Home / Self Care | Payer: PPO | Source: Skilled Nursing Facility | Attending: Internal Medicine | Admitting: Internal Medicine

## 2014-06-03 ENCOUNTER — Non-Acute Institutional Stay (SKILLED_NURSING_FACILITY): Payer: PPO | Admitting: Internal Medicine

## 2014-06-03 DIAGNOSIS — R21 Rash and other nonspecific skin eruption: Secondary | ICD-10-CM

## 2014-06-03 DIAGNOSIS — Z96611 Presence of right artificial shoulder joint: Secondary | ICD-10-CM

## 2014-06-03 LAB — BASIC METABOLIC PANEL
Anion gap: 8 (ref 5–15)
BUN: 20 mg/dL (ref 6–23)
CALCIUM: 8.9 mg/dL (ref 8.4–10.5)
CO2: 27 mmol/L (ref 19–32)
Chloride: 101 mmol/L (ref 96–112)
Creatinine, Ser: 1.09 mg/dL (ref 0.50–1.10)
GFR calc Af Amer: 58 mL/min — ABNORMAL LOW (ref >90)
GFR, EST NON AFRICAN AMERICAN: 50 mL/min — AB (ref >90)
GLUCOSE: 109 mg/dL — AB (ref 70–99)
POTASSIUM: 3.8 mmol/L (ref 3.5–5.1)
SODIUM: 136 mmol/L (ref 135–145)

## 2014-06-03 LAB — CBC WITH DIFFERENTIAL/PLATELET
BASOS PCT: 0 % (ref 0–1)
Basophils Absolute: 0 10*3/uL (ref 0.0–0.1)
Eosinophils Absolute: 0.5 10*3/uL (ref 0.0–0.7)
Eosinophils Relative: 6 % — ABNORMAL HIGH (ref 0–5)
HEMATOCRIT: 32.7 % — AB (ref 36.0–46.0)
HEMOGLOBIN: 10.1 g/dL — AB (ref 12.0–15.0)
LYMPHS ABS: 3.4 10*3/uL (ref 0.7–4.0)
Lymphocytes Relative: 45 % (ref 12–46)
MCH: 24.2 pg — ABNORMAL LOW (ref 26.0–34.0)
MCHC: 30.9 g/dL (ref 30.0–36.0)
MCV: 78.2 fL (ref 78.0–100.0)
MONO ABS: 0.5 10*3/uL (ref 0.1–1.0)
Monocytes Relative: 6 % (ref 3–12)
NEUTROS ABS: 3.3 10*3/uL (ref 1.7–7.7)
NEUTROS PCT: 43 % (ref 43–77)
Platelets: 348 10*3/uL (ref 150–400)
RBC: 4.18 MIL/uL (ref 3.87–5.11)
RDW: 14.5 % (ref 11.5–15.5)
WBC: 7.6 10*3/uL (ref 4.0–10.5)

## 2014-06-04 ENCOUNTER — Other Ambulatory Visit: Payer: Self-pay | Admitting: *Deleted

## 2014-06-04 NOTE — Patient Outreach (Signed)
Previous notes in [facility].

## 2014-06-05 NOTE — Patient Instructions (Addendum)
  Follow activity restrictions per surgeon's instructions. Attend scheduled follow up appointments. Take medications as prescribed. [place] [phone]. Follow COPD action plan as previously discussed.

## 2014-06-05 NOTE — Patient Outreach (Signed)
   06/05/2014   Glenda Terry [birth year] [id number]   Follow up call to patient; COPD program closed out. Patient met goals. Patient is aware of COPD action plan and knows when and how to activate if needed.   Patient reports that she recently had right shoulder replacement 05/24/2014. Was discharged from hospital 03/14 and transferred to [facility] for rehabilitation from 03/14 to 06/03/2014.  Patient states she is currently home and will start  home health services 03/23 for physical therapy and occupational therapy.   Transition of care program opened. States she is taking medications as prescribed. States pain is controlled with percocet and muscle relaxant. Patient has transportation to follow up doctors' appointments. Caregivers are personal aid and daughter.     Will follow up next week.   Sherrin Daisy, RN BSN [facility] Management Coordinator [facility] Care Management  [phone]

## 2014-06-06 NOTE — Progress Notes (Signed)
Patient ID: Glenda Terry, female   DOB: 07-20-1940, 74 y.o.   MRN: [id number]                PROGRESS NOTE  DATE:  06/03/2014               FACILITY: [facility]                                LEVEL OF CARE:   SNF   Acute Visit/Discharge Visit                     CHIEF COMPLAINT:  Pre-discharge review.       HISTORY OF PRESENT ILLNESS:  This is a patient who underwent a right shoulder arthroplasty.  She was electively admitted for this.    When she arrived here, she had a rash along the side of her breast on the right, extending around into her back and superiorly.  This has small, tiny blisters, certainly not a classic zoster rash although that is what I treated her for.  It is also possible that this was some form of contact dermatitis from a surgical dressing or perhaps a sling of some sort, I am uncertain.  Anyway, this seems a lot better.     PHYSICAL EXAMINATION:   SKIN:   INSPECTION:  Her surgical incision looks quite stable.   Apparently due to insurance issues, she is going home.   She certainly cannot drive.  She will require in-home PT and OT.        ASSESSMENT/PLAN:                             Status post surgery on her right shoulder as described.  She seems to have tolerated this well.  She has an in-home aide five days a week, which should be satisfactory.  She will need PT and OT at home.    Zoster.  Whatever rash this was seems to have completely faded.   It is no longer painful.    COPD.   This has never really seemed to be a problem in the short period of time she has been here.    She is going home largely due to insurance issues.  Apparently, her insurance never agreed for her to be here.     CPT CODE: [id number]

## 2014-06-11 ENCOUNTER — Ambulatory Visit: Payer: Self-pay | Admitting: *Deleted

## 2014-06-12 ENCOUNTER — Other Ambulatory Visit: Payer: Self-pay | Admitting: *Deleted

## 2014-06-12 NOTE — Patient Outreach (Signed)
[facility]) Care Management  06/12/2014  Glenda Terry [birth year] [id number]  Transition of care call #2.  Spoke to patient who states she has started home occupational therapy from [facility]. Wearing sling only as desired. States incision look good without signs of infection.    States her pain is controlled by prescribed pain med. Has kept follow up appointments with orthopedist and primary care doctor. Next appointments are scheduled for 04/28 with Dr. Veverly Fells and 05/09 with Dr. [name] Crane. States she is restricted from driving and that daughter is able to take her to appointments.   Other restrictions include no heavy lifting, pushing or pulling with right arm.   No hospital admissions or emergency room visits since previous hospital stay.  Telephonic follow up has been scheduled for next week. Patient agrees with appointment date.Marland Kitchen

## 2014-06-12 NOTE — Patient Outreach (Addendum)
06/12/2014  ALONDRIA MOUSSEAU [birth year]  Dear Dr. [name] Crane,  I am assisting your [facility]) patient as part of our partnership and services with [employer].  The following issues/barriers are the focus of my assistance to this patient:  Post operative right shoulder replacement. Patient education on post operative complications. Monitoring for healing.  The [place] Management Team of RN [facility] Coordinators, RN Health Coaches, Licensed Social Workers, Architect, and Designer, industrial/product, provide a high-touch patient engagement model which targets barriers to care and assist patients struggling with issues such as chronic medical conditions or self-management of their care plan.  The Care Team's focus is to make sure individuals are taking the medications you have prescribed, to reinforce and support your treatment plan and keep individuals accountable for regular follow-up with you.  [facility] Care Management will act as an extra set of eyes and ears to assist you in identifying individuals who need medical follow-up and we will engage the patient in that care.  Please contact me for any assistance that I may be able to help support you with this patient.  Thank you for your continued partnership with [facility].  Sincerely,  Sherrin Daisy, RN BSN CCM Care Management Coordinator [facility] Care Management  [phone]    [facility] Care Management [address], [place], [place]  [address] Phone:  [phone] - Fax:  [phone]    .

## 2014-06-20 ENCOUNTER — Other Ambulatory Visit: Payer: Self-pay | Admitting: *Deleted

## 2014-06-20 NOTE — Patient Outreach (Signed)
[place] [facility]) Care Management  06/20/2014  Glenda Terry Mar 22, 1940 [id number]  RN case manager referral for  transition of care program.  Patient was being followed in [facility] disease management program previously.  Recent hospital admission for shoulder replacement .  Care plan noted and being followed. Plan: Will follow up next week and continue assessments with patient. Patient agrees to telephonic appointment for follow up next week .  Sherrin Daisy, RN BSN [place] Management Coordinator [facility] Care Management  [phone]

## 2014-06-27 ENCOUNTER — Other Ambulatory Visit: Payer: PPO | Admitting: *Deleted

## 2014-06-27 NOTE — Patient Outreach (Signed)
[place] [facility]) Care Management  06/27/2014  Glenda Terry [birth year] [id number]   Follow up telephone call to patient. Patient voices that she continues with home health services and is compliant with doing arm exercises and lower body exercises as prescribed daily.  States feeling much stronger as she ambulates with cane. Having no pain today in shoulder. States she has not observed any signs of infection (drainage, reddness or swelling in surgical area).  States she will call doctor's office if any abnormal signs occur. Next orthopedist appointment 07/04/14.   Plan;  Telephone follow up next week. Patient agrees with appointment time  Sherrin Daisy, RN BSN CCM Care Management Coordinator [facility] Care Management  [phone].

## 2014-08-16 ENCOUNTER — Other Ambulatory Visit: Payer: Self-pay | Admitting: *Deleted

## 2014-08-16 ENCOUNTER — Encounter: Payer: Self-pay | Admitting: *Deleted

## 2014-08-16 NOTE — Patient Outreach (Signed)
[place] [facility] [facility]) Care Management  08/16/2014  ZARAI ORSBORN 28-Aug-1940 [id number]  Follow up telephone call to patient for case closure.  HIPPA verification received. Patient voices that she has not had any readmissions to hospital since shoulder replacement surgery.  Has completed home physical therapy sessions and states lower body strength has greatly improved. States able to use cane now. States also able to use affected arm for some functions. Continues to follow up with orthopedist and primary care doctors. Taking medications as prescribed by doctor.   Patient has family support and the assistance of personal aid during designated times during the week. Patient states she has contact information for [facility] care management if needed.   All of patient goals have been met as noted in care plan.  Plan: close case; Md closure letter sent.  Sherrin Daisy, RN BSN [place] Management Coordinator [facility] Care Management  [phone]

## 2014-08-20 NOTE — Patient Outreach (Signed)
[place] [facility]) Care Management  08/20/2014  STEELE LEDONNE [birth year] [id number]   Notification from Sherrin Daisy, RN to close case to goals met with [place] Management.  Ronnell Freshwater. [place], [place] Management [place] Assistant Phone: [phone] Fax: [phone]

## 2014-11-04 NOTE — Patient Outreach (Signed)
[name]) Care Management  11/04/2014  Glenda Terry [birth year] [id number]   Referral from HTA tier 4 list, assigned to Sherrin Daisy, [facility] for patient outreach.  [name] L. [name], [place] Care Management Assistant

## 2014-11-11 NOTE — Patient Instructions (Signed)
Your procedure is scheduled on: 11/19/2014  Report to [facility] at   56  AM.  Call this number if you have problems the morning of surgery: [phone]   Do not eat food or drink liquids :After Midnight.      Take these medicines the morning of surgery with A SIP OF WATER: xanax, hydrocodone, robaxin, zofran, protinix, micardis, ultram   Do not wear jewelry, make-up or nail polish.  Do not wear lotions, powders, or perfumes.   Do not shave 48 hours prior to surgery.  Do not bring valuables to the hospital.  Contacts, dentures or bridgework may not be worn into surgery.  Leave suitcase in the car. After surgery it may be brought to your room.  For patients admitted to the hospital, checkout time is 11:00 AM the day of discharge.   Patients discharged the day of surgery will not be allowed to drive home.  :     Please read over the following fact sheets that you were given: Coughing and Deep Breathing, Surgical Site Infection Prevention, Anesthesia Post-op Instructions and Care and Recovery After Surgery    Cataract A cataract is a clouding of the lens of the eye. When a lens becomes cloudy, vision is reduced based on the degree and nature of the clouding. Many cataracts reduce vision to some degree. Some cataracts make people more near-sighted as they develop. Other cataracts increase glare. Cataracts that are ignored and become worse can sometimes look white. The white color can be seen through the pupil. CAUSES   Aging. However, cataracts may occur at any age, even in newborns.   Certain drugs.   Trauma to the eye.   Certain diseases such as diabetes.   Specific eye diseases such as chronic inflammation inside the eye or a sudden attack of a rare form of glaucoma.   Inherited or acquired medical problems.  SYMPTOMS   Gradual, progressive drop in vision in the affected eye.   Severe, rapid visual loss. This most often happens when trauma is the cause.  DIAGNOSIS  To detect a  cataract, an eye doctor examines the lens. Cataracts are best diagnosed with an exam of the eyes with the pupils enlarged (dilated) by drops.  TREATMENT  For an early cataract, vision may improve by using different eyeglasses or stronger lighting. If that does not help your vision, surgery is the only effective treatment. A cataract needs to be surgically removed when vision loss interferes with your everyday activities, such as driving, reading, or watching TV. A cataract may also have to be removed if it prevents examination or treatment of another eye problem. Surgery removes the cloudy lens and usually replaces it with a substitute lens (intraocular lens, IOL).  At a time when both you and your doctor agree, the cataract will be surgically removed. If you have cataracts in both eyes, only one is usually removed at a time. This allows the operated eye to heal and be out of danger from any possible problems after surgery (such as infection or poor wound healing). In rare cases, a cataract may be doing damage to your eye. In these cases, your caregiver may advise surgical removal right away. The vast majority of people who have cataract surgery have better vision afterward. HOME CARE INSTRUCTIONS  If you are not planning surgery, you may be asked to do the following:  Use different eyeglasses.   Use stronger or brighter lighting.   Ask your eye doctor about reducing  your medicine dose or changing medicines if it is thought that a medicine caused your cataract. Changing medicines does not make the cataract go away on its own.   Become familiar with your surroundings. Poor vision can lead to injury. Avoid bumping into things on the affected side. You are at a higher risk for tripping or falling.   Exercise extreme care when driving or operating machinery.   Wear sunglasses if you are sensitive to bright light or experiencing problems with glare.  SEEK IMMEDIATE MEDICAL CARE IF:   You have a  worsening or sudden vision loss.   You notice redness, swelling, or increasing pain in the eye.   You have a fever.  Document Released: 03/01/2005 Document Revised: 02/18/2011 Document Reviewed: 10/23/2010 [facility] Patient Information 2012 [place].PATIENT INSTRUCTIONS POST-ANESTHESIA  IMMEDIATELY FOLLOWING SURGERY:  Do not drive or operate machinery for the first twenty four hours after surgery.  Do not make any important decisions for twenty four hours after surgery or while taking narcotic pain medications or sedatives.  If you develop intractable nausea and vomiting or a severe headache please notify your doctor immediately.  FOLLOW-UP:  Please make an appointment with your surgeon as instructed. You do not need to follow up with anesthesia unless specifically instructed to do so.  WOUND CARE INSTRUCTIONS (if applicable):  Keep a dry clean dressing on the anesthesia/puncture wound site if there is drainage.  Once the wound has quit draining you may leave it open to air.  Generally you should leave the bandage intact for twenty four hours unless there is drainage.  If the epidural site drains for more than 36-48 hours please call the anesthesia department.  QUESTIONS?:  Please feel free to call your physician or the hospital operator if you have any questions, and they will be happy to assist you.

## 2014-11-12 ENCOUNTER — Other Ambulatory Visit: Payer: Self-pay | Admitting: *Deleted

## 2014-11-12 ENCOUNTER — Encounter: Payer: Self-pay | Admitting: *Deleted

## 2014-11-12 ENCOUNTER — Encounter (HOSPITAL_COMMUNITY): Payer: Self-pay

## 2014-11-12 ENCOUNTER — Encounter (HOSPITAL_COMMUNITY)
Admission: RE | Admit: 2014-11-12 | Discharge: 2014-11-12 | Disposition: A | Discharge status: Home / Self Care | Payer: PPO | Source: Ambulatory Visit | Attending: Ophthalmology | Admitting: Ophthalmology

## 2014-11-12 DIAGNOSIS — Z01818 Encounter for other preprocedural examination: Secondary | ICD-10-CM | POA: Insufficient documentation

## 2014-11-12 DIAGNOSIS — H2512 Age-related nuclear cataract, left eye: Secondary | ICD-10-CM | POA: Diagnosis not present

## 2014-11-12 LAB — BASIC METABOLIC PANEL
Anion gap: 7 (ref 5–15)
BUN: 20 mg/dL (ref 6–20)
CHLORIDE: 101 mmol/L (ref 101–111)
CO2: 28 mmol/L (ref 22–32)
CREATININE: 1.13 mg/dL — AB (ref 0.44–1.00)
Calcium: 9.2 mg/dL (ref 8.9–10.3)
GFR calc Af Amer: 55 mL/min — ABNORMAL LOW (ref >60)
GFR calc non Af Amer: 47 mL/min — ABNORMAL LOW (ref >60)
Glucose, Bld: 91 mg/dL (ref 65–99)
Potassium: 4.4 mmol/L (ref 3.5–5.1)
SODIUM: 136 mmol/L (ref 135–145)

## 2014-11-12 LAB — CBC WITH DIFFERENTIAL/PLATELET
Basophils Absolute: 0 10*3/uL (ref 0.0–0.1)
Basophils Relative: 0 % (ref 0–1)
EOS ABS: 0.2 10*3/uL (ref 0.0–0.7)
EOS PCT: 2 % (ref 0–5)
HCT: 37.5 % (ref 36.0–46.0)
HEMOGLOBIN: 11.7 g/dL — AB (ref 12.0–15.0)
LYMPHS ABS: 3.8 10*3/uL (ref 0.7–4.0)
Lymphocytes Relative: 34 % (ref 12–46)
MCH: 24.8 pg — AB (ref 26.0–34.0)
MCHC: 31.2 g/dL (ref 30.0–36.0)
MCV: 79.6 fL (ref 78.0–100.0)
MONOS PCT: 6 % (ref 3–12)
Monocytes Absolute: 0.6 10*3/uL (ref 0.1–1.0)
Neutro Abs: 6.6 10*3/uL (ref 1.7–7.7)
Neutrophils Relative %: 58 % (ref 43–77)
PLATELETS: 261 10*3/uL (ref 150–400)
RBC: 4.71 MIL/uL (ref 3.87–5.11)
RDW: 15.1 % (ref 11.5–15.5)
WBC: 11.3 10*3/uL — ABNORMAL HIGH (ref 4.0–10.5)

## 2014-11-12 NOTE — Patient Outreach (Signed)
[place] [facility]) Care Management  11/12/2014  Glenda Terry 12-04-1940 [id number]   Referral from HTA tier 4 list, reassigned to Jacqlyn Larsen, [facility] for patient outreach.  [name] L. [name], [place] Care Management Assistant

## 2014-11-12 NOTE — Patient Outreach (Signed)
11/12/14- Referral received from data analysis, telephone call to pt, HIPAA verified, pt states she is at hospital having [place] [place] completed and will call back when she gets home. Pt called RN CM back and states she will be having cataract surgery soon, reports blood pressure is under good control " most of the time". Reports she has not had "any episodes with my breathing since around April", pt reports having all medications and taking as prescribed, is able to afford medications, pt still drives, lives in [place] apartments, has had no falls, does have difficulty sleeping at times, takes xanax to help with this. Pt states she may get a prescription from  Dr. [name] Crane to get a new cane.  Pt appreciative of call and states she has no needs for care management at this time but would like [facility] information mailed to her home for future reference if needed.  RN CM mailed letter and [facility] information to pt home and faxed letter to Dr. Delphina Cahill informing pt not interested in [facility] services.  Case closed.  Jacqlyn Larsen [facility], [place] Coordinator [phone]

## 2014-11-18 NOTE — H&P (Signed)
Surgeon's preoperative summary of results and expectations for patient: Happy Begeman    Activity limitations caused by visual disabilty related to cataract(s). Pt states they have difficulty reading and driving at night, headlights appear doubled c/o skim over OS   Best corrected visual acuity:   Right Eye Left Eye  Distance Acuity 40  25-   Near Acuity 2  1+    I certify that my patient's record contains the following information:  The record contains digital ultrasonic biometry for the purpose of determining the correct implant power for the operative eye.  The patients' impairment of visual function is believed not to be correctable with a tolerable change in glasses or contact lenses.  Cataract (in the operative eye) is believed to be significantly contributing to the patient's visual impairment.  The patient desires surgical correction;  the risks, benefits, and alternatives have been expalined; and a reasonable expectation exists that lens surgery will significantly improve both the visual and functional status of the patient.  I certify the statements on this sheet are true to the best of my knowledge.   Patient's statement of visual disability OS:  Has indicated that he/she has difficulty in the following tasks associated with daily living: difficulty reading small print on books, newspapers, labels, etc. difficulty watching television due to glare or inability to read captions difficulty driving due to inability to read traffic signals or street signs difficulty driving at night due to glare, halos, or rings Blurry or hazy vision Light sensitivity or experiencing glare on sunny days Decreased vision at night difficulty writing checks, letters, or filling out forms  *   Lala Lund, MD 11/12/14 08:47:16 Digitally signed to expedite the transference of documentation.           [place], [place] Lala Lund., M.D. Surgical  Request and Orders  Procedure Date:    11/19/2014     Patient:  Jenifer, Struve    [facility]:  [id number]   DOB:  1940-06-24   Sex:  female  Address:  [address] , [place]  [place],  [place]   [address]  Home Phone:  [phone]  Work:   cell:     [place]  ID#:  [id number] Grp#:   Precert required?  No Pre-cert #:        Procedure:  Phacoemulsification with lens implant OS (LEFT EYE)  Preoperative Diagnosis/symptoms:  surgical cataract of left eye Reason for Surgery:  The patient complains of difficulty performing daily activities, including but not limited to reading and drivingplease see chief complaint for details. Special instructions/orders:  Please add 4.0 mL Omidria (phenylephrine 1% / ketorolac 0.3%) to 500 mL BSS irrigation solution  *    Lala Lund, MD 11/12/14 08:47:30 Digitally signed to expedite the transference of documentation.       [facility] PHYSCIAN ORDERS:  ANESTHESIA:  Topical/Monitored Anesthesia Care   ALLERGIES:    Benadryl Flexeril Tree nuts   HOSPITAL DISPOSITION:  Short Stay Outpatient   LABORATORY:  Per Anesthesia request only. Please call Dr. Iona Hansen' office for panic values  [phone] or [phone]).   X-RAY:  Per Anesthesia request only.  HEART STATION:  EKG  Per anesthesia request only.  PHARMACY: Purchased at retail pharmacy:   1)   Gatifloxacin 0.5% Eye drops, 1 drop OU qid, begin 3 days prior to surgery, repeat in a.m. prior to leaving for the hospital,  and to operative eye only qid, begin 3 hours post op.  Moxifloxacin 0.5% may be substituted for gatifloxacin using the same instructions.    2)   Ketorolac 0.5% Eye drops, 1 drop to each eye qid, begin one day prior to surgery and before leaving for the hospital, and to operative eye only qid, begin 3 hours post op. 3)  prednisolone acetate 1% eye drops, 1 drop qid to left eye, begin 3 hours post op.      PRE OP ORDERS:   1)    Instil 1 drop of cyclomydril * Oph to left eye x 3 on arrival to Short Stay Unit. * NOTE  cyclopentylate 1% and phenylephrine .25% or homatropine 5% and phenylephrine .25% ophthalmic drops may be substitued for cyclomydril, one drop of each q 5 minutes x 3 doses to left eye on arrival to [facility]                          2)   Instill 1 drop tetracaine 0.5% Oph to left eye q 5 minutes x 3 on arrival to short stay and onve on arrival to OR. 3)    Instill 2 drops lidocaine Oph gel 3.5% to left eye in OR before pre-op prep.    SPECIAL INSTRUCTIONS/ORDERS:  DO NOT USE BETADINE, pt allergic   POST OP SHORT STAY DISCHARGE ORDERS/PATIENT INSTRUCTIONS:  1)  Resume Diet/previous med orders per med reconciliation form:   2)  F/U appt at Dr Iona Hansen Office in [place]:  11/20/14@1 :45 pm   3)   D/C from Short Stay per protocol. 4)   Begin Pred Forte (prednisolone acetate 1%), Ketorolac 0.5% and gatifloxacin 0.5% eye drops;  1 drop each 4 times daily to operative eye, begin 3 hrs. post D/C from Short Stay Unit.  Moxifloxacin 0.5% may be substituted for gatifloxcin using the same instructions. 5)   Page Dr. Iona Hansen via beeper [phone]) if significant pain in or around operative eye. 6)   If  patient on Plavix pre-op, restart at usual dose on evening of surgery. 7)   Wear dark glasses as necessary for excessive light sensitivity post op. 8)   Instruct patient not to forcefully rub operative LEFT eye. 9)   Keep eye dry for 1 week.  Patient may gently wipe lids with damp wash cloth. 10)  Patient may resume normal occupational activities in one week and resume driving as tolerated   after the first post operative visit. 11)   It is normal to have blurred vision and a scratchy sensation following surgery.    Lala Lund, MD 11/12/14 08:47:45 Digitally signed to expedite the transference of documentation.   I have received a copy of "Dr. Iona Hansen Post Operative Instructions for Cataract Patients" and will  abide by them. Patient signature:  Witness signature:                                                 Date/Time:      [facility] [address] [place], [place] [address]   Dr. Iona Hansen Post Operative Instructions For Cataract Patients  These instructions are for [facility]  and pertain to the operative LEFT EYE.  1)  Resume your Diet and previous medicines as you normally use them.   2)  Your  Follow up appointment is at Dr. Iona Hansen' Office in [place] on 11/20/14@1 :45 pm    3)   You may leave the hospital when your driver is present and your nurse releases you. 4)  Begin Pred Forte (prednisolone acetate 1%), Ketorolac 0.5% and Zymaxid (gatifloxacin 0.5%) eye drops;  1 drop each 4 times daily to operative eye, begin 3 hrs. Following discharge from Short Stay Unit.  Vigamox (moxifloxacin 0.5%) may be substituted for Zymar using the same instructions. 5)  Page Dr. Iona Hansen via cell phone [phone]) for significant pain in or around operative eye. 6)   If you took Plavix before surgery, restart it at the usual dose on the evening of surgery. 7)   Wear dark glasses as necessary for excessive light sensitivity. 8)   Do not to forcefully rub your left eye. 9)   Keep your left eye dry for 1 week.  You may gently clean your eyelids with a damp wash cloth. 10) You may resume normal occupational activities in one week and resume driving as tolerated after the first post operative visit. 11)  It is normal to have blurred vision and a scratchy sensation following surgery.         Pre Surgical History and Physical Exam  Patient:  Krizia Flight     Date:      11/12/2014 9:06:35 AM  Chief Complaint Pt states they have difficulty reading and driving at night, headlights appear doubled c/o skim over OS  +HPI:   Location OU  Quality Pt states they have difficulty reading and driving at night, headlights appear doubled c/o skim over OS  Severity moderate  Duration x 6 mo    Timing constant  Context Hx: ptosis LUL, cat OU, hyperopia, presbyopia, HTN-controlled  Modifying Factors no gtts  Medical History:              Bilateral Dermatochalasis of upperlids Cataract, cortical OS Cataract, cortical OD Myopia  Blurred vision  Cataract, Nuclear OS  Cataract, Nuclear OD  Ptosis of eyelid, unspecified  LUL Presbyopia  Hyperopia  Family Hx:                      non contributary  Social Hx:                        Allergy:                          Benadryl,Keflex,Statins,Betadine Med List:                        Montelukast 10mg , Pantoprazole 40mg , Telmisartan/HCTZ 80/12.5mg ,Alprazolam 0.5mg , Oxycodone/APAP 5/325mg ,ProAir HFA Aersol, QVar 62mcg,Albuterol 0.63 mg,Loratidine 10mg ,FOlic Acis 712WPY,KDXI 65mg , Docusate 100mg ,Diphenhydramine 25mg ,Tussin DM prn  Notes:                               Review of Systems:   Pt. admits to:  LUL feeling heavy, decreased vision OS   The patient admits PJ:ASNK  The patient admits to: and Hypertension  The patient admits to: and Acid Reflux, GERD  The patient admits to: and Arthritis  The patient admits NL:[id number] Attacks   Physical Exam: General:  The patient is a well nourished, well developed [place] American female.  Systolic: Diastolic: Pulse:  419 82 80    SHEENT:  Normocephalic, normally pigmented, atraumatic  Neck:              without mass, midline structural deviation or malposition      Chest:             clear to auscultation bilaterally Breast:            Deferred  Heart:             The heart sounds are normal.  There is no murmur or gallop present  Abdomen:       Normal bowel sounds present  Pelvic:            Deferred GU:                 Deferred Extremities:    symmetric without pretibial edema Skin:               Deferred  Neurological:  Pt oriented, cranial nerves grossly normal   Eye Exam:   Best corrected Vision Right:  20/40      Left:  20/25-  Spectacle Rx Right: -0.25 sph  Spectacle  Rx Left:   +0.75 sph   Eye Exam                LEFT   Lids:                       Lids function normally and are symmetrically, and normally positioned with clean/quiet margins OU.   Conjunctiva:           White and quiet  Cornea:                  Clear, with adequate cell count  Anterior Chamber:  deep and quiet  Iris:                        The iris is uniformly colored with a round symmetric pupil.  Lens:                      4+ nuclear sclerosis  Anterior Vitreous:   Clear and quiet Tonometry, right:   09    left:  10    Fundus, disc:           The discs are  normal, healthy appearing OU.  Fundus, macula:      The background, macula, and vessels are normal in appearance.  Dr. Iona Hansen Notes:   Please add 4.0 mL Omidria (phenylephrine 1% / ketorolac 0.3%) to 500 mL BSS irrigation solution no items of interest  Ultrasonic Digital Biometry OS:  24.26                                             OS  PC Lens (primary lens)   Alcon [id number]  Lens Power  +18.50  Calc. Ametropia  plano       AC Lens (Alt. lens)   Alcon [id number]   Lens Power   +16.00        Diagnosis:     Surgical Cataract LEFThas been specifically requested by the patient with reasonable eexpectation of achieving stated visual/functional goals.  Alternative treatments, including optical correction have been discussed with the patient as well as the mitigating effects of ocular co-morbidities on final vision.   Patient's statement  of visual disability OS:  Has indicated that he/she has difficulty in the following tasks associated with daily living: difficulty reading small print on books, newspapers, labels, etc. difficulty watching television due to glare or inability to read captions difficulty driving due to inability to read traffic signals or street signs difficulty driving at night due to glare, halos, or rings Blurry or hazy vision Light sensitivity or experiencing glare on sunny days Decreased vision at  night difficulty writing checks, letters, or filling out forms     Surgical Plan:   Phacoemulsification with lens implant OS (LEFT EYE)   Post Op Appt:  11/20/14@1 :45 pm  in [employer].    Lala Lund, MD 11/12/14 08:48:20 Digitally signed to expedite the transference of documentation.*    [facility] EYE DROP INSTRUCTIONS BEFORE SURGERY THESE MEDICATIONS MUST BE PURCHASED AT YOUR PHARMACY BEFORE YOUR PREOP OFFICE EVALUATION  GATIFLOXACIN  0.5% EYE DROPS OR MOXIFLOXACIN 0.5% EYE DROPS:  USE ONE DROP TO BOTH EYES 4 TIMES DAILY BEGINNING ON THE FRIDAY MORNING BEFORE SURGERY AND ONCE TO BOTH EYES BEFORE LEAVING FOR SURGERY MONDAY MORNING.  KETOROLAC 0.5% EYE DROPS:  USE ONE DROP 4 TIMES DAILY TO BOTH EYES BEGINNING ON THE SUNDAY MORNING BEFORE SURGERY AND AGAIN ON MONDAY MORNING BEFORE LEAVING FOR SURGERY.  PREDNISOLONE ACETATE 1% EYE DROPS:  DO NOT USE THESE BEFORE SURGERY  PLEASE TAKE YOUR DROPS WITH YOU TO [facility] AND MAKE SURE THEY ARE RETURNED TO YOU BEFORE YOU LEAVE.  YOU WILL USE ALL OF THEM AGAIN AFTER YOUR SURGERY.  [facility] STAFF

## 2014-11-19 ENCOUNTER — Encounter (HOSPITAL_COMMUNITY)
Admission: RE | Disposition: A | Discharge status: Home / Self Care | Payer: Self-pay | Source: Ambulatory Visit | Attending: Ophthalmology

## 2014-11-19 ENCOUNTER — Ambulatory Visit (HOSPITAL_COMMUNITY): Payer: PPO | Admitting: Anesthesiology

## 2014-11-19 ENCOUNTER — Ambulatory Visit (HOSPITAL_COMMUNITY)
Admission: RE | Admit: 2014-11-19 | Discharge: 2014-11-19 | Disposition: A | Discharge status: Home / Self Care | Payer: PPO | Source: Ambulatory Visit | Attending: Ophthalmology | Admitting: Ophthalmology

## 2014-11-19 ENCOUNTER — Encounter (HOSPITAL_COMMUNITY): Payer: Self-pay | Admitting: *Deleted

## 2014-11-19 DIAGNOSIS — I10 Essential (primary) hypertension: Secondary | ICD-10-CM | POA: Diagnosis not present

## 2014-11-19 DIAGNOSIS — Z87891 Personal history of nicotine dependence: Secondary | ICD-10-CM | POA: Diagnosis not present

## 2014-11-19 DIAGNOSIS — H2512 Age-related nuclear cataract, left eye: Secondary | ICD-10-CM | POA: Diagnosis present

## 2014-11-19 DIAGNOSIS — F419 Anxiety disorder, unspecified: Secondary | ICD-10-CM | POA: Insufficient documentation

## 2014-11-19 DIAGNOSIS — J449 Chronic obstructive pulmonary disease, unspecified: Secondary | ICD-10-CM | POA: Diagnosis not present

## 2014-11-19 DIAGNOSIS — M069 Rheumatoid arthritis, unspecified: Secondary | ICD-10-CM | POA: Diagnosis not present

## 2014-11-19 DIAGNOSIS — K219 Gastro-esophageal reflux disease without esophagitis: Secondary | ICD-10-CM | POA: Insufficient documentation

## 2014-11-19 DIAGNOSIS — Z79899 Other long term (current) drug therapy: Secondary | ICD-10-CM | POA: Diagnosis not present

## 2014-11-19 HISTORY — PX: CATARACT EXTRACTION W/PHACO: SHX586

## 2014-11-19 SURGERY — PHACOEMULSIFICATION, CATARACT, WITH IOL INSERTION
Anesthesia: Monitor Anesthesia Care | Site: Eye | Laterality: Left

## 2014-11-19 MED ORDER — GLYCOPYRROLATE 0.2 MG/ML IJ SOLN
INTRAMUSCULAR | Status: AC
  Filled 2014-11-19: qty 1

## 2014-11-19 MED ORDER — GLYCOPYRROLATE 0.2 MG/ML IJ SOLN
0.2000 mg | Freq: Once | INTRAMUSCULAR | Status: AC
  Administered 2014-11-19: 0.2 mg via INTRAVENOUS

## 2014-11-19 MED ORDER — FENTANYL CITRATE (PF) 100 MCG/2ML IJ SOLN
25.0000 ug | INTRAMUSCULAR | Status: AC
  Administered 2014-11-19: 25 ug via INTRAVENOUS

## 2014-11-19 MED ORDER — LIDOCAINE HCL 3.5 % OP GEL
OPHTHALMIC | Status: AC
  Filled 2014-11-19: qty 1

## 2014-11-19 MED ORDER — MIDAZOLAM HCL 2 MG/2ML IJ SOLN
INTRAMUSCULAR | Status: AC
  Filled 2014-11-19: qty 4

## 2014-11-19 MED ORDER — ONDANSETRON HCL 4 MG/2ML IJ SOLN: 4.0000 mg | Freq: Once | INTRAMUSCULAR | Status: DC | PRN

## 2014-11-19 MED ORDER — MIDAZOLAM HCL 2 MG/2ML IJ SOLN
1.0000 mg | INTRAMUSCULAR | Status: DC | PRN
  Administered 2014-11-19: 2 mg via INTRAVENOUS

## 2014-11-19 MED ORDER — MIDAZOLAM HCL 5 MG/5ML IJ SOLN
INTRAMUSCULAR | Status: DC | PRN
  Administered 2014-11-19 (×2): 1 mg via INTRAVENOUS

## 2014-11-19 MED ORDER — ALBUTEROL SULFATE (2.5 MG/3ML) 0.083% IN NEBU
2.5000 mg | INHALATION_SOLUTION | Freq: Once | RESPIRATORY_TRACT | Status: AC
  Administered 2014-11-19: 2.5 mg via RESPIRATORY_TRACT

## 2014-11-19 MED ORDER — LIDOCAINE HCL 3.5 % OP GEL
OPHTHALMIC | Status: DC | PRN
  Administered 2014-11-19: 1 via OPHTHALMIC

## 2014-11-19 MED ORDER — TETRACAINE HCL 0.5 % OP SOLN
OPHTHALMIC | Status: AC
  Filled 2014-11-19: qty 2

## 2014-11-19 MED ORDER — PHENYLEPHRINE-KETOROLAC 1-0.3 % IO SOLN
INTRAOCULAR | Status: AC
  Filled 2014-11-19: qty 4

## 2014-11-19 MED ORDER — LACTATED RINGERS IV SOLN
INTRAVENOUS | Status: DC
  Administered 2014-11-19: 07:00:00 via INTRAVENOUS

## 2014-11-19 MED ORDER — NA HYALUR & NA CHOND-NA HYALUR 0.55-0.5 ML IO KIT
PACK | INTRAOCULAR | Status: DC | PRN
  Administered 2014-11-19: 1 via OPHTHALMIC

## 2014-11-19 MED ORDER — PHENYLEPHRINE-KETOROLAC 1-0.3 % IO SOLN
INTRAOCULAR | Status: DC | PRN
  Administered 2014-11-19: 500 mL via OPHTHALMIC

## 2014-11-19 MED ORDER — CYCLOPENTOLATE-PHENYLEPHRINE OP SOLN OPTIME - NO CHARGE
OPHTHALMIC | Status: AC
  Filled 2014-11-19: qty 2

## 2014-11-19 MED ORDER — FENTANYL CITRATE (PF) 100 MCG/2ML IJ SOLN
INTRAMUSCULAR | Status: AC
  Filled 2014-11-19: qty 2

## 2014-11-19 MED ORDER — ALBUTEROL SULFATE (2.5 MG/3ML) 0.083% IN NEBU
INHALATION_SOLUTION | RESPIRATORY_TRACT | Status: AC
  Filled 2014-11-19: qty 3

## 2014-11-19 MED ORDER — CYCLOPENTOLATE-PHENYLEPHRINE 0.2-1 % OP SOLN
1.0000 [drp] | OPHTHALMIC | Status: AC
  Administered 2014-11-19 (×3): 1 [drp] via OPHTHALMIC

## 2014-11-19 MED ORDER — MIDAZOLAM HCL 2 MG/2ML IJ SOLN
INTRAMUSCULAR | Status: AC
  Filled 2014-11-19: qty 2

## 2014-11-19 MED ORDER — TETRACAINE HCL 0.5 % OP SOLN
1.0000 [drp] | OPHTHALMIC | Status: AC
  Administered 2014-11-19 (×3): 1 [drp] via OPHTHALMIC

## 2014-11-19 MED ORDER — BSS IO SOLN
INTRAOCULAR | Status: DC | PRN
  Administered 2014-11-19: 15 mL via INTRAOCULAR

## 2014-11-19 MED ORDER — SODIUM CHLORIDE 0.9 % IN NEBU
INHALATION_SOLUTION | RESPIRATORY_TRACT | Status: AC
  Filled 2014-11-19: qty 3

## 2014-11-19 MED ORDER — FENTANYL CITRATE (PF) 100 MCG/2ML IJ SOLN: 25.0000 ug | INTRAMUSCULAR | Status: DC | PRN

## 2014-11-19 MED ORDER — LIDOCAINE HCL 3.5 % OP GEL
1.0000 "application " | Freq: Once | OPHTHALMIC | Status: AC
  Administered 2014-11-19: 1 via OPHTHALMIC

## 2014-11-19 SURGICAL SUPPLY — 8 items
CLOTH BEACON ORANGE TIMEOUT ST (SAFETY) ×1 IMPLANT
GLOVE BIOGEL PI IND STRL 6.5 (GLOVE) IMPLANT
GLOVE BIOGEL PI INDICATOR 6.5 (GLOVE) ×1
GLOVE EXAM NITRILE MD LF STRL (GLOVE) ×1 IMPLANT
INST SET CATARACT ~~LOC~~ (KITS) ×2 IMPLANT
LENS ALC ACRYL/TECN (Ophthalmic Related) ×2 IMPLANT
PAD ARMBOARD 7.5X6 YLW CONV (MISCELLANEOUS) ×1 IMPLANT
WATER STERILE IRR 250ML POUR (IV SOLUTION) ×1 IMPLANT

## 2014-11-19 NOTE — Discharge Instructions (Signed)
Glenda Terry 11/19/2014 Dr. Iona Hansen Post operative Instructions for Cataract Patients  These instructions are for [facility] and pertain to the operative eye.  1.  Resume your normal diet and previous oral medicines.  2. Your Follow-up appointment is at Dr. Iona Hansen' office in [place] on 11/20/14 at 1:45 pm.  3. You may leave the hospital when your driver is present and your nurse releases you.  4. Begin Pred Forte (prednisolone acetate 1%), Acular LS (ketorolac tromethamine .4%) and Gatifloxacin 0.5% eye drops; 1 drop each 4 times daily to operative eye. Begin 3 hours after discharge from Short Stay Unit.  Moxifloxacin 0.5% may be substituted for Gatifloxacin using the same instructions.  67. Page Dr. Iona Hansen via beeper [phone] for significant pain in or around operative eye that is not relieved by Tylenol.  6. If you took Plavix before surgery, restart it at the usual dose on the evening of surgery.  7. Wear dark glasses as necessary for excessive light sensitivity.  8. Do no forcefully rub you your operative eye.  9. Keep your operative eye dry for 1 week. You may gently clean your eyelids with a damp washcloth.  10. You may resume normal occupational activities in one week and resume driving as tolerated after the first post operative visit.  11. It is normal to have blurred vision and a scratchy sensation following surgery.  Dr. Iona Hansen: [phone]

## 2014-11-19 NOTE — Anesthesia Preprocedure Evaluation (Signed)
Anesthesia Evaluation  Patient identified by MRN, date of birth, ID band Patient awake    Airway Mallampati: II   Neck ROM: Full    Dental  (+) Dental Advisory Given   Pulmonary shortness of breath and with exertion, COPD COPD inhaler, former smoker,  breath sounds clear to auscultation        Cardiovascular hypertension, Pt. on medications Rhythm:Regular  ECHO 2014 EF 55%   Neuro/Psych PSYCHIATRIC DISORDERS Anxiety    GI/Hepatic GERD-  Medicated,  Endo/Other  Morbid obesity  Renal/GU GFR 56     Musculoskeletal  (+) Arthritis -,   Abdominal (+)  Abdomen: soft.    Peds  Hematology  (+) anemia , 11/36   Anesthesia Other Findings   Reproductive/Obstetrics                             Anesthesia Physical Anesthesia Plan  ASA: III  Anesthesia Plan: MAC   Post-op Pain Management:    Induction: Intravenous  Airway Management Planned: Nasal Cannula  Additional Equipment:   Intra-op Plan:   Post-operative Plan:   Informed Consent: I have reviewed the patients History and Physical, chart, labs and discussed the procedure including the risks, benefits and alternatives for the proposed anesthesia with the patient or authorized representative who has indicated his/her understanding and acceptance.     Plan Discussed with:   Anesthesia Plan Comments:         Anesthesia Quick Evaluation

## 2014-11-19 NOTE — Patient Outreach (Signed)
[facility]) Care Management  11/12/2014  Glenda Terry [birth year] [id number]   Notification from Jacqlyn Larsen, RN to close case due to patient refused [place] Management services.  Thanks, Ronnell Freshwater. [place], [place] Assistant Phone: [phone] Fax: [phone]

## 2014-11-19 NOTE — Brief Op Note (Signed)
11/19/2014  8:54 AM  PATIENT:  Glenda Terry  74 y.o. female  PRE-OPERATIVE DIAGNOSIS:  nuclear cataract left eye  POST-OPERATIVE DIAGNOSIS:  nuclear cataract left eye  PROCEDURE:  Procedure(s): CATARACT EXTRACTION PHACO AND INTRAOCULAR LENS PLACEMENT (IOC)  SURGEON:  Surgeon(s): Williams Che, MD  ASSISTANTS: Zoila Shutter, CST   ANESTHESIA STAFF: Anesthesiologist: Lerry Liner, MD CRNA: Mickel Baas, CRNA  ANESTHESIA:   topical and MAC  REQUESTED LENS POWER: 18.5  LENS IMPLANT INFORMATION:  Alcon SN60WF  S/N  [id number]  Exp 06/2019  CUMULATIVE DISSIPATED ENERGY:4.77  INDICATIONS:see office H&P for specific indications  OP FINDINGS:mod. dense NS  COMPLICATIONS:None  DICTATION #: none  PLAN OF CARE: as above  PATIENT DISPOSITION:  Short Stay

## 2014-11-19 NOTE — Transfer of Care (Signed)
Immediate Anesthesia Transfer of Care Note  Patient: Glenda Terry  Procedure(s) Performed: Procedure(s) with comments: CATARACT EXTRACTION PHACO AND INTRAOCULAR LENS PLACEMENT (IOC) (Left) - CDE: 4.77  Patient Location: Short Stay  Anesthesia Type:MAC  Level of Consciousness: awake, alert , oriented and patient cooperative  Airway & Oxygen Therapy: Patient Spontanous Breathing  Post-op Assessment: Report given to RN and Post -op Vital signs reviewed and stable  Post vital signs: Reviewed and stable  Last Vitals:  Filed Vitals:   11/19/14 0635  Temp: 80.1 C    Complications: No apparent anesthesia complications

## 2014-11-19 NOTE — H&P (Signed)
I have reviewed the pre printed H&P, the patient was re-examined, and I have identified no significant interval changes in the patient's medical condition.  There is no change in the plan of care since the history and physical of record. 

## 2014-11-19 NOTE — Anesthesia Postprocedure Evaluation (Signed)
  Anesthesia Post-op Note  Patient: Glenda Terry  Procedure(s) Performed: Procedure(s) with comments: CATARACT EXTRACTION PHACO AND INTRAOCULAR LENS PLACEMENT (IOC) (Left) - CDE: 4.77  Patient Location: Short Stay  Anesthesia Type:MAC  Level of Consciousness: awake, alert , oriented and patient cooperative  Airway and Oxygen Therapy: Patient Spontanous Breathing  Post-op Pain: none  Post-op Assessment: Post-op Vital signs reviewed, Patient's Cardiovascular Status Stable, Respiratory Function Stable, Patent Airway, No signs of Nausea or vomiting and Pain level controlled              Post-op Vital Signs: Reviewed and stable  Last Vitals:  Filed Vitals:   11/19/14 0635  Temp: 38.4 C    Complications: No apparent anesthesia complications

## 2014-11-19 NOTE — Anesthesia Procedure Notes (Signed)
Procedure Name: MAC Date/Time: 11/19/2014 7:33 AM Performed by: Andree Elk, AMY A Pre-anesthesia Checklist: Patient identified, Timeout performed, Emergency Drugs available, Suction available and Patient being monitored Oxygen Delivery Method: Nasal cannula

## 2014-11-19 NOTE — Op Note (Signed)
11/19/2014  8:54 AM  PATIENT:  Glenda Terry  74 y.o. female  PRE-OPERATIVE DIAGNOSIS:  nuclear cataract left eye  POST-OPERATIVE DIAGNOSIS:  nuclear cataract left eye  PROCEDURE:  Procedure(s): CATARACT EXTRACTION PHACO AND INTRAOCULAR LENS PLACEMENT (IOC)  SURGEON:  Surgeon(s): Williams Che, MD  ASSISTANTS: Zoila Shutter, CST   ANESTHESIA STAFF: Anesthesiologist: Lerry Liner, MD CRNA: Mickel Baas, CRNA  ANESTHESIA:   topical and MAC  REQUESTED LENS POWER: 18.5  LENS IMPLANT INFORMATION:  Alcon SN60WF  S/N  [id number]  Exp 06/2019  CUMULATIVE DISSIPATED ENERGY:4.77  INDICATIONS:see office H&P for specific indications  OP FINDINGS:mod. dense NS  COMPLICATIONS:None  PROCEDURE:  The patient was brought to the operating room in good condition.  The operative eye was prepped and draped in the usual fashion for intraocular surgery.  Lidocaine gel was dropped onto the eye.  A 2.4 mm 10 O'clock near clear corneal stepped incision and a 12 O'clock stab incision were created.  Viscoat was instilled into the anterior chamber.  The 5 mm anterior capsulorhexis was performed with a bent needle cystotome and Utrata forceps.  The lens was hydrodissected and hydrodelineated with a cannula and balanced salt solution and rotated with a Kuglen hook.  Phacoemulsification was perfomed in the divide and conquer technique.  The remaining cortex was removed with I&A and the capsular surfaces polished as necessary.  Provisc was placed into the capsular bag and the lens inserted with the Alcon inserter.  The viscoelastic was removed with I&A and the lens "rocked" into position.  The wounds were hydrated and te anterior chamber was refilled with balanced salt solution.  The wounds were checked for leakage and rehydrated as necessary.  The lid speculum and drapes were removed and the patient was transported to short stay in good condition.  PATIENT DISPOSITION:  Short Stay

## 2014-11-20 ENCOUNTER — Encounter (HOSPITAL_COMMUNITY): Payer: Self-pay | Admitting: Ophthalmology

## 2015-01-22 NOTE — Patient Outreach (Signed)
[facility]) Care Management  01/22/2015  SAYDIE GERDTS 1940/10/18 [id number]   Referral from HTA tier 3 list, assigned Jon Billings, RN for patient outreach.  Ivar Domangue L. Saran Laviolette, [place] Care Management Assistant

## 2015-01-23 ENCOUNTER — Other Ambulatory Visit: Payer: Self-pay

## 2015-01-23 DIAGNOSIS — J441 Chronic obstructive pulmonary disease with (acute) exacerbation: Secondary | ICD-10-CM

## 2015-01-23 NOTE — Patient Outreach (Signed)
[facility]) Care Management  01/23/2015  Glenda Terry Aug 02, 1940 [id number]   Referral Date: 01-22-15 Referral Source: HTA Tier 3 list Referral Reason: COPD with 1 ED visit and 1 admit Outreach Attempt: 1st telephone outreach to patient successful  Social:  Patient lives alone.  Daughters are local but assistance is limited.  Patient is independent with activities of daily living and still drives.     Conditions: Patient admits to COPD and reports knowing about and having the COPD action plan.  Patient states she was a patient earlier this year and feels she is doing good medically.  However, patient reports having scoliosis and has back issues.  Patient expressed need for new mattress as her mattress is sinking. She reports she does not have the finances to afford and neither does her family.     Medications:  Patient reports no issues with medications and is able to afford medications.    Assessment: Patient does not have any nursing needs at this time.  Patient however, needs assistance with financial resources for a new mattress.    Plan:  RN Health Coach will do referral for social to help with possible assistance to obtain a new mattress.  [name], RN, MSN [place] [phone]

## 2015-01-23 NOTE — Patient Outreach (Signed)
[facility]) Care Management  01/23/2015  DAHIANA CICCARELLI 1941/02/27 [id number]   Notification from Jon Billings, RN to assign SW, assigned [employer], LCSW.  Thanks, Ronnell Freshwater. [place], [place] Assistant Phone: [phone] Fax: [phone]

## 2015-01-24 ENCOUNTER — Other Ambulatory Visit: Payer: Self-pay

## 2015-01-24 NOTE — Patient Outreach (Signed)
[place]) Care Management  01/24/2015  Glenda Terry 1940-06-25 [id number]  Telephone call to Dr. [name] office to inquire for possible assist for a mattress from a medical standpoint being that patient states she has scoliosis.  Message left on clinic voice mail.    Plan: RN Health Coach will wait follow up from physician's office.    [name], RN, MSN [place] [phone]

## 2015-01-27 ENCOUNTER — Encounter: Payer: Self-pay | Admitting: Licensed Clinical Social Worker

## 2015-01-27 ENCOUNTER — Other Ambulatory Visit: Payer: Self-pay | Admitting: Licensed Clinical Social Worker

## 2015-01-27 NOTE — Patient Outreach (Signed)
Assessment:  CSW received referral on client. CSW completed chart review on clent on 11/14/216  CSW called client on 01/27/15 and spoke via phone with client. CSW verified identity of client. Client gave CSW verbal permission on 01/27/15 for CSW to talk with client about current needs of client.  Client said she had pain issues for several years.  She said she went to Pain Clinic in [place], [place] (Dr. Nelva Bush) in August of 2016.  She said she will return to Pain Clinic in [place], [place] for another appointment in December of 2016.  She said she is taking pain medication but she said she does not want to get dependent on pain medication and is trying to take least pain medication she can to help with pain mangement.  She has decreased family support. She said she drives herself to needed medical appointments. She and CSW completed needed assessment information for [facility] program for client. CSW and client completed PHQ2 and PHQ 9 for client on 01/27/15. CSW and client spoke of client care plan.  CSW and client developed care plan goal for client on 01/27/15.  Client said she has ongoing back pain issues.  She said she is taking a medication also to help her manage stress/anxiety.  CSW gave client the CSW name and phone number [phone]). CSW encouraged client to call CSW as needed to discuss social work needs of client.  Plan: Client to lie down, meditate or pray as needed in next 30 days to help her manage stress/anxiety. CSW to call client in two weeks to assess client needs.  Norva Riffle.Devere Brem MSW, LCSW Licensed Clinical Social Worker [facility] Care Management [phone]

## 2015-01-27 NOTE — Patient Instructions (Signed)
Glenda Terry  01/27/2015     @PREFPERIOPPHARMACY @   Your procedure is scheduled on 02/03/2015.  Report to [facility] at 6:30 A.M.  Call this number if you have problems the morning of surgery:  [phone]   Remember:  Do not eat food or drink liquids after midnight.  Take these medicines the morning of surgery with A SIP OF WATER albuterol, Xanax, Qvar, Claritin, Protonix, Oxycodone   Do not wear jewelry, make-up or nail polish.  Do not wear lotions, powders, or perfumes.  You may wear deodorant.  Do not shave 48 hours prior to surgery.  Men may shave face and neck.  Do not bring valuables to the hospital.  [facility] is not responsible for any belongings or valuables.  Contacts, dentures or bridgework may not be worn into surgery.  Leave your suitcase in the car.  After surgery it may be brought to your room.  For patients admitted to the hospital, discharge time will be determined by your treatment team.  Patients discharged the day of surgery will not be allowed to drive home.    Please read over the following fact sheets that you were given. Anesthesia Post-op Instructions     PATIENT INSTRUCTIONS POST-ANESTHESIA  IMMEDIATELY FOLLOWING SURGERY:  Do not drive or operate machinery for the first twenty four hours after surgery.  Do not make any important decisions for twenty four hours after surgery or while taking narcotic pain medications or sedatives.  If you develop intractable nausea and vomiting or a severe headache please notify your doctor immediately.  FOLLOW-UP:  Please make an appointment with your surgeon as instructed. You do not need to follow up with anesthesia unless specifically instructed to do so.  WOUND CARE INSTRUCTIONS (if applicable):  Keep a dry clean dressing on the anesthesia/puncture wound site if there is drainage.  Once the wound has quit draining you may leave it open to air.  Generally you should leave the bandage intact for twenty  four hours unless there is drainage.  If the epidural site drains for more than 36-48 hours please call the anesthesia department.  QUESTIONS?:  Please feel free to call your physician or the hospital operator if you have any questions, and they will be happy to assist you.       A cataract is a clouding of the lens of the eye. When a lens becomes cloudy, vision is reduced based on the degree and nature of the clouding. Surgery may be needed to improve vision. Surgery removes the cloudy lens and usually replaces it with a substitute lens (intraocular lens, IOL). LET YOUR EYE DOCTOR KNOW ABOUT:  Allergies to food or medicine.  Medicines taken including herbs, eye drops, over-the-counter medicines, and creams.  Use of steroids (by mouth or creams).  Previous problems with anesthetics or numbing medicine.  History of bleeding problems or blood clots.  Previous surgery.  Other health problems, including diabetes and kidney problems.  Possibility of pregnancy, if this applies. RISKS AND COMPLICATIONS  Infection.  Inflammation of the eyeball (endophthalmitis) that can spread to both eyes (sympathetic ophthalmia).  Poor wound healing.  If an IOL is inserted, it can later fall out of proper position. This is very uncommon.  Clouding of the part of your eye that holds an IOL in place. This is called an "after-cataract." These are uncommon but easily treated. BEFORE THE PROCEDURE  Do not eat or drink anything except small amounts of water for 8 to  12 before your surgery, or as directed by your caregiver.  Unless you are told otherwise, continue any eye drops you have been prescribed.  Talk to your primary caregiver about all other medicines that you take (both prescription and nonprescription). In some cases, you may need to stop or change medicines near the time of your surgery. This is most important if you are taking blood-thinning medicine.Do not stop medicines unless you are told  to do so.  Arrange for someone to drive you to and from the procedure.  Do not put contact lenses in either eye on the day of your surgery. PROCEDURE There is more than one method for safely removing a cataract. Your doctor can explain the differences and help determine which is best for you. Phacoemulsification surgery is the most common form of cataract surgery.  An injection is given behind the eye or eye drops are given to make this a painless procedure.  A small cut (incision) is made on the edge of the clear, dome-shaped surface that covers the front of the eye (cornea).  A tiny probe is painlessly inserted into the eye. This device gives off ultrasound waves that soften and break up the cloudy center of the lens. This makes it easier for the cloudy lens to be removed by suction.  An IOL may be implanted.  The normal lens of the eye is covered by a clear capsule. Part of that capsule is intentionally left in the eye to support the IOL.  Your surgeon may or may not use stitches to close the incision. There are other forms of cataract surgery that require a larger incision and stitches to close the eye. This approach is taken in cases where the doctor feels that the cataract cannot be easily removed using phacoemulsification. AFTER THE PROCEDURE  When an IOL is implanted, it does not need care. It becomes a permanent part of your eye and cannot be seen or felt.  Your doctor will schedule follow-up exams to check on your progress.  Review your other medicines with your doctor to see which can be resumed after surgery.  Use eye drops or take medicine as prescribed by your doctor.   This information is not intended to replace advice given to you by your health care provider. Make sure you discuss any questions you have with your health care provider.   Document Released: 02/18/2011 Document Revised: 03/22/2014 Document Reviewed: 02/18/2011 Elsevier Interactive Patient Education  [employer].

## 2015-01-28 ENCOUNTER — Other Ambulatory Visit: Payer: Self-pay

## 2015-01-28 ENCOUNTER — Encounter (HOSPITAL_COMMUNITY): Payer: Self-pay

## 2015-01-28 ENCOUNTER — Encounter (HOSPITAL_COMMUNITY)
Admission: RE | Admit: 2015-01-28 | Discharge: 2015-01-28 | Disposition: A | Discharge status: Home / Self Care | Payer: PPO | Source: Ambulatory Visit | Attending: Ophthalmology | Admitting: Ophthalmology

## 2015-01-28 DIAGNOSIS — H2511 Age-related nuclear cataract, right eye: Secondary | ICD-10-CM | POA: Diagnosis not present

## 2015-01-28 DIAGNOSIS — Z01818 Encounter for other preprocedural examination: Secondary | ICD-10-CM | POA: Insufficient documentation

## 2015-01-28 LAB — BASIC METABOLIC PANEL
ANION GAP: 7 (ref 5–15)
BUN: 18 mg/dL (ref 6–20)
CALCIUM: 9.4 mg/dL (ref 8.9–10.3)
CO2: 30 mmol/L (ref 22–32)
Chloride: 103 mmol/L (ref 101–111)
Creatinine, Ser: 1.02 mg/dL — ABNORMAL HIGH (ref 0.44–1.00)
GFR, EST NON AFRICAN AMERICAN: 54 mL/min — AB (ref >60)
Glucose, Bld: 86 mg/dL (ref 65–99)
POTASSIUM: 4.1 mmol/L (ref 3.5–5.1)
Sodium: 140 mmol/L (ref 135–145)

## 2015-01-28 LAB — CBC
HEMATOCRIT: 34.6 % — AB (ref 36.0–46.0)
Hemoglobin: 10.5 g/dL — ABNORMAL LOW (ref 12.0–15.0)
MCH: 24.4 pg — ABNORMAL LOW (ref 26.0–34.0)
MCHC: 30.3 g/dL (ref 30.0–36.0)
MCV: 80.3 fL (ref 78.0–100.0)
PLATELETS: 231 10*3/uL (ref 150–400)
RBC: 4.31 MIL/uL (ref 3.87–5.11)
RDW: 14.8 % (ref 11.5–15.5)
WBC: 6.7 10*3/uL (ref 4.0–10.5)

## 2015-01-28 NOTE — Patient Outreach (Signed)
[facility]) Care Management  01/28/2015  NERISSA STARR [birth year] [id number]   Telephone call to Dr. Mickel Duhamel Office after receiving message from [facility], LCSW about possibly attempting orthopedic doctor to get maybe specialty mattress for patient due to her back issues. Message left with health coach contact information concerning patient need for a mattress.   Plan : RN Health Coach will wait response from orthopedic office.  [name], RN, MSN [place] [phone]

## 2015-01-28 NOTE — Patient Outreach (Signed)
[place] [facility]) Care Management  01/28/2015  NATALYE REOME 05-26-1940 [id number]  Telephone call to patient to advise on progress of getting a mattress.  Advised patient that I contacted [facility] to request a hospital bed with specialty mattress due to increased back issues.  Advised that we are waiting for MD order and that the order would be from [facility].  Patient verbalized understanding and was appreciative of the help.  Asked patient about space for a hospital bed in her apartment.  Patient states she would have her regular bed taken down.    Plan: Message to  M. [facility], LCSW updating on progress. RN Health Coach will wait for response from [place].   [name], RN, MSN [place] [phone]

## 2015-01-28 NOTE — Patient Outreach (Signed)
[place]) Care Management  01/28/2015  ATIYANA HERRERO 12/24/1940 [id number]   Incoming call from [facility] to clarify request for specialty mattress for patient.  Advised that patient is having increased back issues and that patient needs a new mattress to help with back issues. Hospital bed with speciality mattress order will be sent up to physician for order approval.  Order will be sent to [facility].  [name], RN, MSN [place] [phone]

## 2015-01-28 NOTE — Pre-Procedure Instructions (Signed)
Patient given information to sign up for my chart at home. 

## 2015-01-29 ENCOUNTER — Other Ambulatory Visit: Payer: Self-pay

## 2015-01-29 NOTE — Patient Outreach (Signed)
[facility]) Care Management  01/29/2015  TANIA HAAGEN [birth year] [id number]   Received a message from Marlowe Kays at [facility] stating that Dr. Wynelle Link has denied the request for a hospital bed or specialty mattress.    Telephone call to Dr. [name] Crane to follow up on request for hospital bed with specialty mattress.  Message left on clinic voice mail.    Plan: RN Health Coach will inform M.[facility] [place] of status.  RN Health Coach will wait to hear from Dr. [name] office concerning request.    [name], RN, MSN [place] [phone]

## 2015-01-31 ENCOUNTER — Other Ambulatory Visit: Payer: Self-pay

## 2015-01-31 MED ORDER — CYCLOPENTOLATE-PHENYLEPHRINE OP SOLN OPTIME - NO CHARGE
OPHTHALMIC | Status: AC
  Filled 2015-01-31: qty 2

## 2015-01-31 MED ORDER — TETRACAINE HCL 0.5 % OP SOLN
OPHTHALMIC | Status: AC
  Filled 2015-01-31: qty 2

## 2015-01-31 MED ORDER — LIDOCAINE HCL 3.5 % OP GEL
OPHTHALMIC | Status: AC
Start: 2015-01-31 — End: 2015-01-31
  Filled 2015-01-31: qty 1

## 2015-01-31 NOTE — Patient Outreach (Signed)
[facility]) Care Management  01/31/2015  Glenda Terry 03-04-1941 [id number]   Telephone call to Dr. [name] office to follow up on request for hospital bed for patient.  Spoke with [employer] given.  [name] states that she would call [facility] to inquire about hospital bed and needed documentation and try to get the order.   Plan: RN Health Coach will wait for follow up from primary care office.  [name], RN, MSN [place] [phone]

## 2015-02-03 ENCOUNTER — Ambulatory Visit (HOSPITAL_COMMUNITY)
Admission: RE | Admit: 2015-02-03 | Discharge: 2015-02-03 | Disposition: A | Discharge status: Home / Self Care | Payer: PPO | Source: Ambulatory Visit | Attending: Ophthalmology | Admitting: Ophthalmology

## 2015-02-03 ENCOUNTER — Ambulatory Visit (HOSPITAL_COMMUNITY): Payer: PPO | Admitting: Anesthesiology

## 2015-02-03 ENCOUNTER — Encounter (HOSPITAL_COMMUNITY)
Admission: RE | Disposition: A | Discharge status: Home / Self Care | Payer: Self-pay | Source: Ambulatory Visit | Attending: Ophthalmology

## 2015-02-03 ENCOUNTER — Encounter (HOSPITAL_COMMUNITY): Payer: Self-pay | Admitting: *Deleted

## 2015-02-03 DIAGNOSIS — Z6838 Body mass index (BMI) 38.0-38.9, adult: Secondary | ICD-10-CM | POA: Insufficient documentation

## 2015-02-03 DIAGNOSIS — F419 Anxiety disorder, unspecified: Secondary | ICD-10-CM | POA: Insufficient documentation

## 2015-02-03 DIAGNOSIS — Z87891 Personal history of nicotine dependence: Secondary | ICD-10-CM | POA: Diagnosis not present

## 2015-02-03 DIAGNOSIS — M1990 Unspecified osteoarthritis, unspecified site: Secondary | ICD-10-CM | POA: Diagnosis not present

## 2015-02-03 DIAGNOSIS — J449 Chronic obstructive pulmonary disease, unspecified: Secondary | ICD-10-CM | POA: Diagnosis not present

## 2015-02-03 DIAGNOSIS — K219 Gastro-esophageal reflux disease without esophagitis: Secondary | ICD-10-CM | POA: Insufficient documentation

## 2015-02-03 DIAGNOSIS — H2511 Age-related nuclear cataract, right eye: Secondary | ICD-10-CM | POA: Diagnosis not present

## 2015-02-03 DIAGNOSIS — I10 Essential (primary) hypertension: Secondary | ICD-10-CM | POA: Diagnosis not present

## 2015-02-03 HISTORY — PX: CATARACT EXTRACTION W/PHACO: SHX586

## 2015-02-03 SURGERY — PHACOEMULSIFICATION, CATARACT, WITH IOL INSERTION
Anesthesia: Monitor Anesthesia Care | Site: Eye | Laterality: Right

## 2015-02-03 MED ORDER — LIDOCAINE HCL 3.5 % OP GEL: 1.0000 "application " | Freq: Once | OPHTHALMIC | Status: DC

## 2015-02-03 MED ORDER — FENTANYL CITRATE (PF) 100 MCG/2ML IJ SOLN
25.0000 ug | INTRAMUSCULAR | Status: AC
  Administered 2015-02-03 (×2): 25 ug via INTRAVENOUS

## 2015-02-03 MED ORDER — PHENYLEPHRINE-KETOROLAC 1-0.3 % IO SOLN
INTRAOCULAR | Status: DC | PRN
  Administered 2015-02-03: 500 mL via OPHTHALMIC

## 2015-02-03 MED ORDER — MIDAZOLAM HCL 2 MG/2ML IJ SOLN
INTRAMUSCULAR | Status: AC
  Filled 2015-02-03: qty 2

## 2015-02-03 MED ORDER — POVIDONE-IODINE 5 % OP SOLN
OPHTHALMIC | Status: DC | PRN
  Administered 2015-02-03: 1 via OPHTHALMIC

## 2015-02-03 MED ORDER — NA HYALUR & NA CHOND-NA HYALUR 0.55-0.5 ML IO KIT
PACK | INTRAOCULAR | Status: DC | PRN
  Administered 2015-02-03: 1 via OPHTHALMIC

## 2015-02-03 MED ORDER — CYCLOPENTOLATE-PHENYLEPHRINE 0.2-1 % OP SOLN
1.0000 [drp] | OPHTHALMIC | Status: AC
  Administered 2015-02-03 (×3): 1 [drp] via OPHTHALMIC

## 2015-02-03 MED ORDER — TETRACAINE 0.5 % OP SOLN OPTIME - NO CHARGE
OPHTHALMIC | Status: DC | PRN
  Administered 2015-02-03: 2 [drp] via OPHTHALMIC

## 2015-02-03 MED ORDER — LACTATED RINGERS IV SOLN
INTRAVENOUS | Status: DC
  Administered 2015-02-03: 08:00:00 via INTRAVENOUS

## 2015-02-03 MED ORDER — ONDANSETRON HCL 4 MG/2ML IJ SOLN: 4.0000 mg | Freq: Once | INTRAMUSCULAR | Status: DC | PRN

## 2015-02-03 MED ORDER — FENTANYL CITRATE (PF) 100 MCG/2ML IJ SOLN: 25.0000 ug | INTRAMUSCULAR | Status: DC | PRN

## 2015-02-03 MED ORDER — PHENYLEPHRINE-KETOROLAC 1-0.3 % IO SOLN
INTRAOCULAR | Status: AC
  Filled 2015-02-03: qty 4

## 2015-02-03 MED ORDER — BSS IO SOLN
INTRAOCULAR | Status: DC | PRN
  Administered 2015-02-03: 15 mL

## 2015-02-03 MED ORDER — FENTANYL CITRATE (PF) 100 MCG/2ML IJ SOLN
INTRAMUSCULAR | Status: AC
  Filled 2015-02-03: qty 2

## 2015-02-03 MED ORDER — MIDAZOLAM HCL 2 MG/2ML IJ SOLN
1.0000 mg | INTRAMUSCULAR | Status: DC | PRN
  Administered 2015-02-03 (×2): 2 mg via INTRAVENOUS
  Filled 2015-02-03: qty 2

## 2015-02-03 MED ORDER — LIDOCAINE HCL 3.5 % OP GEL
OPHTHALMIC | Status: DC | PRN
  Administered 2015-02-03: 1 via OPHTHALMIC

## 2015-02-03 MED ORDER — TETRACAINE HCL 0.5 % OP SOLN
1.0000 [drp] | OPHTHALMIC | Status: AC
  Administered 2015-02-03 (×3): 1 [drp] via OPHTHALMIC

## 2015-02-03 MED ORDER — KETOROLAC TROMETHAMINE 0.5 % OP SOLN: 1.0000 [drp] | OPHTHALMIC | Status: DC

## 2015-02-03 SURGICAL SUPPLY — 8 items
CLOTH BEACON ORANGE TIMEOUT ST (SAFETY) ×2 IMPLANT
GLOVE BIOGEL PI IND STRL 6.5 (GLOVE) IMPLANT
GLOVE BIOGEL PI INDICATOR 6.5 (GLOVE) ×2
GLOVE EXAM NITRILE MD LF STRL (GLOVE) ×2 IMPLANT
INST SET CATARACT ~~LOC~~ (KITS) ×3 IMPLANT
LENS ALC ACRYL/TECN (Ophthalmic Related) ×3 IMPLANT
PAD ARMBOARD 7.5X6 YLW CONV (MISCELLANEOUS) ×2 IMPLANT
WATER STERILE IRR 250ML POUR (IV SOLUTION) ×2 IMPLANT

## 2015-02-03 NOTE — Anesthesia Preprocedure Evaluation (Signed)
Anesthesia Evaluation  Patient identified by MRN, date of birth, ID band Patient awake    Airway Mallampati: II   Neck ROM: Full    Dental  (+) Dental Advisory Given   Pulmonary shortness of breath and with exertion, COPD,  COPD inhaler, former smoker,    breath sounds clear to auscultation       Cardiovascular hypertension, Pt. on medications  Rhythm:Regular  ECHO 2014 EF 55%   Neuro/Psych PSYCHIATRIC DISORDERS Anxiety    GI/Hepatic GERD  Medicated,  Endo/Other  Morbid obesity  Renal/GU GFR 56     Musculoskeletal  (+) Arthritis ,   Abdominal (+)  Abdomen: soft.    Peds  Hematology  (+) anemia , 11/36   Anesthesia Other Findings   Reproductive/Obstetrics                             Anesthesia Physical Anesthesia Plan  ASA: III  Anesthesia Plan: MAC   Post-op Pain Management:    Induction: Intravenous  Airway Management Planned: Nasal Cannula  Additional Equipment:   Intra-op Plan:   Post-operative Plan:   Informed Consent: I have reviewed the patients History and Physical, chart, labs and discussed the procedure including the risks, benefits and alternatives for the proposed anesthesia with the patient or authorized representative who has indicated his/her understanding and acceptance.     Plan Discussed with:   Anesthesia Plan Comments:         Anesthesia Quick Evaluation

## 2015-02-03 NOTE — Anesthesia Postprocedure Evaluation (Signed)
  Anesthesia Post-op Note  Patient: Glenda Terry  Procedure(s) Performed: Procedure(s) with comments: CATARACT EXTRACTION PHACO AND INTRAOCULAR LENS PLACEMENT (IOC) (Right) - CDE: 4.54  Patient Location: Short Stay  Anesthesia Type:MAC  Level of Consciousness: awake, alert  and patient cooperative  Airway and Oxygen Therapy: Patient Spontanous Breathing  Post-op Pain: none  Post-op Assessment: Post-op Vital signs reviewed and Patient's Cardiovascular Status Stable              Post-op Vital Signs: Reviewed and stable   Complications: No apparent anesthesia complications

## 2015-02-03 NOTE — Progress Notes (Signed)
This encounter was created in error - please disregard.

## 2015-02-03 NOTE — Transfer of Care (Signed)
Immediate Anesthesia Transfer of Care Note  Patient: Glenda Terry  Procedure(s) Performed: Procedure(s) (LRB): CATARACT EXTRACTION PHACO AND INTRAOCULAR LENS PLACEMENT (IOC) (Right)  Patient Location: Shortstay  Anesthesia Type: MAC  Level of Consciousness: awake  Airway & Oxygen Therapy: Patient Spontanous Breathing   Post-op Assessment: Report given to PACU RN, Post -op Vital signs reviewed and stable and Patient moving all extremities  Post vital signs: Reviewed and stable  Complications: No apparent anesthesia complications

## 2015-02-03 NOTE — H&P (Signed)
I have reviewed the pre printed H&P, the patient was re-examined, and I have identified no significant interval changes in the patient's medical condition.  There is no change in the plan of care since the history and physical of record. 

## 2015-02-03 NOTE — Brief Op Note (Signed)
02/03/2015  9:08 AM  PATIENT:  Glenda Terry  74 y.o. female  PRE-OPERATIVE DIAGNOSIS:  nuclear cataract right eye  POST-OPERATIVE DIAGNOSIS:  nuclear cataract right eye  PROCEDURE:  Procedure(s): CATARACT EXTRACTION PHACO AND INTRAOCULAR LENS PLACEMENT (IOC)  SURGEON:  Surgeon(s): Williams Che, MD  ASSISTANTS: Maggi9e Henserson, CST   ANESTHESIA STAFF: Anesthesiologist: Lerry Liner, MD CRNA: Vista Deck, CRNA  ANESTHESIA:   topical and MAC  REQUESTED LENS POWER: 17.5  LENS IMPLANT INFORMATION:  Alcon SN60WF  S/n [id number]  Exp 06/2018  CUMULATIVE DISSIPATED ENERGY:4.54  INDICATIONS:see scanned office H&P for particulars  OP FINDINGS:mod. dense NS  COMPLICATIONS:None  DICTATION #: none  PLAN OF CARE: as above  PATIENT DISPOSITION:  Short Stay

## 2015-02-03 NOTE — Anesthesia Procedure Notes (Signed)
Procedure Name: MAC Date/Time: 02/03/2015 8:30 AM Performed by: Vista Deck Pre-anesthesia Checklist: Patient identified, Emergency Drugs available, Suction available, Timeout performed and Patient being monitored Patient Re-evaluated:Patient Re-evaluated prior to inductionOxygen Delivery Method: Nasal Cannula

## 2015-02-03 NOTE — Op Note (Signed)
02/03/2015  9:08 AM  PATIENT:  Glenda Terry  74 y.o. female  PRE-OPERATIVE DIAGNOSIS:  nuclear cataract right eye  POST-OPERATIVE DIAGNOSIS:  nuclear cataract right eye  PROCEDURE:  Procedure(s): CATARACT EXTRACTION PHACO AND INTRAOCULAR LENS PLACEMENT ([place])  SURGEON:  Surgeon(s): Williams Che, MD  ASSISTANTS: Maggi9e Henserson, CST   ANESTHESIA STAFF: Anesthesiologist: Lerry Liner, MD CRNA: Vista Deck, CRNA  ANESTHESIA:   topical and MAC  REQUESTED LENS POWER: 17.5  LENS IMPLANT INFORMATION:  Alcon SN60WF  S/n FO:[id number]  Exp 06/2018  CUMULATIVE DISSIPATED ENERGY:4.54  INDICATIONS:see scanned office H&P for particulars  OP FINDINGS:mod. dense NS  COMPLICATIONS:None  PROCEDURE:  The patient was brought to the operating room in good condition.  The operative eye was prepped and draped in the usual fashion for intraocular surgery.  Lidocaine gel was dropped onto the eye.  A 2.4 mm 10 O'clock near clear corneal stepped incision and a 12 O'clock stab incision were created.  Viscoat was instilled into the anterior chamber.  The 5 mm anterior capsulorhexis was performed with a bent needle cystotome and Utrata forceps.  The lens was hydrodissected and hydrodelineated with a cannula and balanced salt solution and rotated with a Kuglen hook.  Phacoemulsification was perfomed in the divide and conquer technique.  The remaining cortex was removed with I&A and the capsular surfaces polished as necessary.  Provisc was placed into the capsular bag and the lens inserted with the Alcon inserter.  The viscoelastic was removed with I&A and the lens "rocked" into position.  The wounds were hydrated and te anterior chamber was refilled with balanced salt solution.  The wounds were checked for leakage and rehydrated as necessary.  The lid speculum and drapes were removed and the patient was transported to short stay in good condition.  PATIENT DISPOSITION:  Short Stay

## 2015-02-04 ENCOUNTER — Other Ambulatory Visit: Payer: Self-pay

## 2015-02-04 ENCOUNTER — Encounter (HOSPITAL_COMMUNITY): Payer: Self-pay | Admitting: Ophthalmology

## 2015-02-04 NOTE — Patient Outreach (Signed)
[place] [facility]) Care Management  02/04/2015  Glenda Terry 06/29/1940 [id number]   Telephone call to patient to give update on hospital bed.  Spoke with patient son.  Patient had cataract surgery on yesterday and is resting.  Advised that order will be being sent to [place].  He verbalized understanding.  [name], RN, MSN [place] [phone]

## 2015-02-04 NOTE — Patient Outreach (Signed)
[facility]) Care Management  02/04/2015  Glenda Terry 04/06/1940 [id number]   Telephone call to Dr. [name] office to inquire about status of the hospital bed order.  Spoke with [name] she states that Dr. [name] Crane has given the okay for the order to be called to [place].  [name] states she will call [facility] with the order.    [name], RN, MSN [place] [phone]

## 2015-02-10 ENCOUNTER — Encounter: Payer: Self-pay | Admitting: Licensed Clinical Social Worker

## 2015-02-10 ENCOUNTER — Other Ambulatory Visit: Payer: Self-pay | Admitting: Licensed Clinical Social Worker

## 2015-02-10 NOTE — Patient Outreach (Addendum)
Assessment:  CSW called client phone number on 02/10/15 and spoke via phone with client on 02/10/15.  CSW verified identity of client. CSW and Glenda Terry spoke of client needs. CSW asked client if she had ongoing back pain. She said she has chronic back pain and is taking pain medication as prescribed.  CSW informed client that Dr. [name] Crane had ordered a Terry bed with mattress for client and sent order to [facility]. Client is waiting to receive call from [facility] representative to discuss Terry bed and mattress for client. Client said: " I don't have any money.  I will not be able to pay for a monthly rental of Terry bed.  I cannot afford to pay monthly rental costs for bed."  Client also said: "One of my goals for new year is to lose some weight.  I am obese."  CSW informed client that CSW would communicate above information to Glenda Terry, Glenda Terry for client. Client said she has some family support.  CSW and client spoke of her care plan goal. She said she will continue to take naps as needed, read Bible as needed, pray as needed and use meditation as needed to help her manage anxiety symptoms. CSW encouraged client to continue to use these methods developed to help her manage anxiety symptoms. Client informed CSW on 02/10/15 that she had completed Glenda Terry consent form on 02/10/15 and is mailing completed Glenda Terry consent form on 02/10/15 to Glenda Terry office in [place], [place].  Glenda Terry thanked client for phone call with CSW on 02/10/15. Client was appreciative of phone call from Glenda Terry on 02/10/15.   Plan: Client to continue to utilize relaxation techniques developed to help her manage anxiety symptoms faced. CSW to call client in 3 weeks to assess client needs.  Glenda Terry.Glenda Terry MSW, LCSW Licensed Clinical Social Worker [facility] Care Management [phone]

## 2015-02-25 ENCOUNTER — Other Ambulatory Visit: Payer: Self-pay

## 2015-02-25 NOTE — Patient Outreach (Signed)
[name]) Care Management  02/25/2015  Glenda Terry Mar 07, 1941 [id number]   Telephone call attempt to patient to follow up on hospital bed.  No answer and unable to leave a message.  Plan: RN Health Coach will attempt patient another time.    [name], RN, MSN [place] [phone]

## 2015-02-27 ENCOUNTER — Other Ambulatory Visit: Payer: Self-pay

## 2015-02-27 NOTE — Patient Outreach (Signed)
[facility]) Care Management  02/27/2015  Glenda Terry 04-04-1940 [id number]   Conference call with patient and  [name], LCSW.  Patient states she has not heard anything from [facility] about the hospital bed.  However, patient states that her children have bought her a bed and she is waiting for her mattress. Patient is satisfied with plan for her new bed her children bought her.  Patient continues to have some problems with back pain but recently had an epidural that she states has helped. 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 She states she was trying to make an appointment but was unable to get through.  Advised patient to try again on tomorrow to make her appointment for therapy.  She verbalized understanding.    Telephone call to primary care office to speak with Nurse [name] about hospital bed.  [name] will not be in until Monday.   Plan:  RN Health Coach will follow up with primary office on status of hospital bed and let them know of new finding to cancel request for hospital bed.   [name], RN, MSN [place] [phone]

## 2015-02-28 ENCOUNTER — Other Ambulatory Visit: Payer: Self-pay | Admitting: Licensed Clinical Social Worker

## 2015-02-28 NOTE — Patient Outreach (Signed)
Assessment:  CSW called client on 02/28/15 and spoke via phone with client.  CSW verified identity of client. CSW and client spoke of client needs. Client said that her children had bought her a new bed and were planning to obtain box springs and mattress set for her new bed in a few days.  She was excited that her children were buying these items needed for her. She said that [facility] had not contacted her related to hospital bed order from Dr. Delphina Cahill. Jon Billings [facility] RN Health Coach is attempting to call Alene Mires at practice of Dr. [name] Crane to inform [name] of this new information regarding new bed, box spring and mattress for client.  Client informed CSW of her recent medical appointments attended.Client said she has her prescribed medications and is taking medications as prescribed. Client and CSW spoke of client care plan. Client said she is planning on attending scheduled medical appointments for client in next 30 days.  Client said she has contacted local agency for outpatient physical therapy.  She said she will have her first outpatient physical therapy session on  03/24/15 in [place], [place].  She said order had been written for her to receive 8 physical therapy outpatient   sessions.  Client said she has appointment with Dr. Yolanda Bonine, opthamologist, on 04/03/15.  She said she has appointment with Dr. [name] Crane on 04/11/15.  She said she has orthopedic doctor appointment also on 04/08/15.  She said her income is too high to qualify for Medicaid benefit.  She is thinking of going to the [facility] in [place], [place] in January 2017 to exercise ([employer] program).  She said she has [employer].  Client said she has some support from her children.  Client is interested in staying healthy and said she was trying to be cautious regarding respiratory issues. She said she does not want to have a bronchitis flare up. She is very [employer] staff support.  CSW thanked client for phone call  with CSW on 02/28/15.    Plan; Client to attend all scheduled medical appointments for client in next 30 days CSW to call client in three weeks to assess client needs.  Norva Riffle.Niyam Bisping MSW, LCSW Licensed Clinical Social Worker [facility] Care Management [phone]

## 2015-03-03 ENCOUNTER — Other Ambulatory Visit: Payer: Self-pay

## 2015-03-03 NOTE — Patient Outreach (Signed)
[name]) Care Management  03/03/2015  Glenda Terry 12-27-1940 [id number]  Telephone call to  [name] at Dr. Durene Cal' office advised her that patient recently got a new bed from her children and that patient no longer needing request for a hospital bed.  She  verbalized understanding and will make a note.      Plan: Social Worker will continue to follow with patient for future needs.    [name], RN, MSN [place] [phone]

## 2015-03-18 DIAGNOSIS — M6281 Muscle weakness (generalized): Secondary | ICD-10-CM | POA: Diagnosis not present

## 2015-03-18 DIAGNOSIS — M25561 Pain in right knee: Secondary | ICD-10-CM | POA: Diagnosis not present

## 2015-03-18 DIAGNOSIS — Z96651 Presence of right artificial knee joint: Secondary | ICD-10-CM | POA: Diagnosis not present

## 2015-03-21 ENCOUNTER — Other Ambulatory Visit: Payer: Self-pay | Admitting: Licensed Clinical Social Worker

## 2015-03-21 NOTE — Patient Outreach (Signed)
Assessment:  CSW called client on 03/21/15 and spoke via phone with client on 03/21/15. CSW verified client identity. CSW and client spoke of client needs. CSW spoke of her right knee pain issues She has bone spurs at her right knee cap area.  She is attending physical therapy sessions at [facility] in [place], [place] and has 8 sessions scheduled.  She has currently attended 4 of these physical therapy sessions.  She has prescribed medications and his taking her medications as prescribed. She has a new bed, a new boxspring, and a new mattress. She said her back is having less pain since she starting using new bed, boxspring and mattress. She has medical appointment with Dr. [name] Crane on 04/11/15. She has eye appointment on 04/03/15.  She has appointment with orthopedist on 04/08/15. She said she is planning to go to [facility] in February to hopefully begin exercising at [facility].  CSW spoke with client about client care plan. She is using relaxation techniques to help manage anxiety and stress. She also said she is planning to attend all scheduled client medical appointments in next 30 days. CSW encouraged client to use relaxation techniques to continue to manage anxiety and stress. CSW encouraged client to attend scheduled medical appointments.  CSW thanked client for phone call with CSW on 03/21/15. Client was appreciative of call from [place] on 03/21/15.    Plan: Client to use relaxation techniques to help client manage stress and anxiety. Client to attend scheduled client medical appointments. CSW to call client in 4 weeks to assess client needs.  Norva Riffle.Destenie Ingber MSW, LCSW Licensed Clinical Social Worker [facility] Care Management [phone]

## 2015-03-24 ENCOUNTER — Ambulatory Visit (HOSPITAL_COMMUNITY): Payer: PPO | Admitting: Physical Therapy

## 2015-03-26 ENCOUNTER — Encounter (HOSPITAL_COMMUNITY): Payer: PPO | Admitting: Physical Therapy

## 2015-03-31 ENCOUNTER — Encounter (HOSPITAL_COMMUNITY): Payer: PPO | Admitting: Physical Therapy

## 2015-04-02 ENCOUNTER — Encounter (HOSPITAL_COMMUNITY): Payer: PPO | Admitting: Physical Therapy

## 2015-04-02 ENCOUNTER — Other Ambulatory Visit (HOSPITAL_COMMUNITY): Payer: Self-pay | Admitting: Internal Medicine

## 2015-04-02 DIAGNOSIS — Z1231 Encounter for screening mammogram for malignant neoplasm of breast: Secondary | ICD-10-CM

## 2015-04-03 DIAGNOSIS — Z961 Presence of intraocular lens: Secondary | ICD-10-CM | POA: Diagnosis not present

## 2015-04-07 ENCOUNTER — Encounter (HOSPITAL_COMMUNITY): Payer: PPO | Admitting: Physical Therapy

## 2015-04-08 DIAGNOSIS — E782 Mixed hyperlipidemia: Secondary | ICD-10-CM | POA: Diagnosis not present

## 2015-04-08 DIAGNOSIS — T849XXA Unspecified complication of internal orthopedic prosthetic device, implant and graft, initial encounter: Secondary | ICD-10-CM | POA: Diagnosis not present

## 2015-04-08 DIAGNOSIS — D509 Iron deficiency anemia, unspecified: Secondary | ICD-10-CM | POA: Diagnosis not present

## 2015-04-08 DIAGNOSIS — Z96651 Presence of right artificial knee joint: Secondary | ICD-10-CM | POA: Diagnosis not present

## 2015-04-11 DIAGNOSIS — F411 Generalized anxiety disorder: Secondary | ICD-10-CM | POA: Diagnosis not present

## 2015-04-11 DIAGNOSIS — M25561 Pain in right knee: Secondary | ICD-10-CM | POA: Diagnosis not present

## 2015-04-11 DIAGNOSIS — R7301 Impaired fasting glucose: Secondary | ICD-10-CM | POA: Diagnosis not present

## 2015-04-11 DIAGNOSIS — J4542 Moderate persistent asthma with status asthmaticus: Secondary | ICD-10-CM | POA: Diagnosis not present

## 2015-04-11 DIAGNOSIS — E6609 Other obesity due to excess calories: Secondary | ICD-10-CM | POA: Diagnosis not present

## 2015-04-11 DIAGNOSIS — D509 Iron deficiency anemia, unspecified: Secondary | ICD-10-CM | POA: Diagnosis not present

## 2015-04-11 DIAGNOSIS — I10 Essential (primary) hypertension: Secondary | ICD-10-CM | POA: Diagnosis not present

## 2015-04-14 ENCOUNTER — Other Ambulatory Visit (HOSPITAL_COMMUNITY): Payer: Self-pay | Admitting: Orthopedic Surgery

## 2015-04-14 ENCOUNTER — Other Ambulatory Visit: Payer: Self-pay | Admitting: Licensed Clinical Social Worker

## 2015-04-14 DIAGNOSIS — T84092D Other mechanical complication of internal right knee prosthesis, subsequent encounter: Secondary | ICD-10-CM

## 2015-04-14 NOTE — Patient Outreach (Signed)
Assessment:  CSW reviewed all [facility] assessments for client in EPIC record on 04/14/15.   Plan: Client to attend all scheduled client medical appointments. CSW to call client on 04/21/15 to assess client needs.  Norva Riffle.Suzetta Timko MSW, LCSW Licensed Clinical Social Worker [facility] Care Management [phone]

## 2015-04-15 ENCOUNTER — Encounter (HOSPITAL_COMMUNITY)
Admission: RE | Admit: 2015-04-15 | Discharge: 2015-04-15 | Disposition: A | Discharge status: Home / Self Care | Payer: PPO | Source: Ambulatory Visit | Attending: Orthopedic Surgery | Admitting: Orthopedic Surgery

## 2015-04-15 ENCOUNTER — Encounter (HOSPITAL_COMMUNITY): Payer: Self-pay

## 2015-04-15 DIAGNOSIS — T84092D Other mechanical complication of internal right knee prosthesis, subsequent encounter: Secondary | ICD-10-CM | POA: Diagnosis not present

## 2015-04-15 DIAGNOSIS — X58XXXD Exposure to other specified factors, subsequent encounter: Secondary | ICD-10-CM | POA: Insufficient documentation

## 2015-04-15 DIAGNOSIS — Z96651 Presence of right artificial knee joint: Secondary | ICD-10-CM | POA: Diagnosis not present

## 2015-04-15 DIAGNOSIS — Z471 Aftercare following joint replacement surgery: Secondary | ICD-10-CM | POA: Diagnosis not present

## 2015-04-15 MED ORDER — TECHNETIUM TC 99M MEDRONATE IV KIT
25.0000 | PACK | Freq: Once | INTRAVENOUS | Status: AC | PRN
  Administered 2015-04-15: 26.5 via INTRAVENOUS

## 2015-04-21 ENCOUNTER — Other Ambulatory Visit: Payer: Self-pay | Admitting: Licensed Clinical Social Worker

## 2015-04-21 NOTE — Patient Outreach (Signed)
Assessment:  CSW called client home phone number on 04/21/15.  CSW spoke via phone with client on 04/21/15.  CSW verified client identity. CSW and [place] spoke of client needs. Client had appointment with Dr. [name] Crane on 04/11/15. She said she has her prescribed medications and is taking medications as prescribed.  She wears glasses to help her see. She recently had a bone scan of her right knee at [facility] and is waiting to hear results of bone scan.  She said her back was not hurting and was feeling better since she started using her new bed.  She said she is eating adequately.  She said she was trying to lose some weight and is thinking of joining local [employer] in [place], [place].  She has some support from her family.  Her son lives in [place], [place].  Her daughter lives about 20 minutes away from client. She is walking in her home but does not use a cane or walker to help with ambulation.  She and CSW completed update of client assessments as needed.  She and CSW spoke of client care plan. She said she would try to go to scheduled client medical appointments in next 30 days. CSW encouraged client to attend scheduled client medical appointments in next 30 days. CSW talked with client about her use of relaxation techniques (taking a nap, reading the Bible, praying, speaking via phone with friends, visiting with church friends or family).  She said she would continue to use relaxation techniques in next 30 days to help her manage anxiety and stress symptoms. CSW thanked client for phone call on 04/21/15. CSW encouraged client to call CSW at [phone] as needed to address social work needs of client.  Plan:  Client to attend all scheduled client medical appointments in next 30 days.. Client to use relaxation techniques in next 30 days to help her manage anxiety and stress symptoms. CSW to call client in 3 weeks to assess client needs.  Norva Riffle.Arrin Ishler MSW, LCSW Licensed Clinical Social Worker [facility] Care  Management [phone]

## 2015-05-05 ENCOUNTER — Emergency Department (HOSPITAL_COMMUNITY): Payer: PPO

## 2015-05-05 ENCOUNTER — Encounter (HOSPITAL_COMMUNITY): Payer: Self-pay

## 2015-05-05 ENCOUNTER — Ambulatory Visit (HOSPITAL_COMMUNITY)
Admission: RE | Admit: 2015-05-05 | Discharge: 2015-05-05 | Disposition: A | Discharge status: Home / Self Care | Payer: PPO | Source: Ambulatory Visit | Attending: Internal Medicine | Admitting: Internal Medicine

## 2015-05-05 ENCOUNTER — Emergency Department (HOSPITAL_COMMUNITY)
Admission: EM | Admit: 2015-05-05 | Discharge: 2015-05-05 | Disposition: A | Discharge status: Home / Self Care | Payer: PPO | Attending: Emergency Medicine | Admitting: Emergency Medicine

## 2015-05-05 ENCOUNTER — Other Ambulatory Visit: Payer: Self-pay

## 2015-05-05 DIAGNOSIS — Z87891 Personal history of nicotine dependence: Secondary | ICD-10-CM | POA: Insufficient documentation

## 2015-05-05 DIAGNOSIS — J45901 Unspecified asthma with (acute) exacerbation: Secondary | ICD-10-CM

## 2015-05-05 DIAGNOSIS — K219 Gastro-esophageal reflux disease without esophagitis: Secondary | ICD-10-CM | POA: Diagnosis not present

## 2015-05-05 DIAGNOSIS — Z90710 Acquired absence of both cervix and uterus: Secondary | ICD-10-CM | POA: Insufficient documentation

## 2015-05-05 DIAGNOSIS — I10 Essential (primary) hypertension: Secondary | ICD-10-CM | POA: Diagnosis not present

## 2015-05-05 DIAGNOSIS — R05 Cough: Secondary | ICD-10-CM | POA: Diagnosis not present

## 2015-05-05 DIAGNOSIS — G8929 Other chronic pain: Secondary | ICD-10-CM | POA: Insufficient documentation

## 2015-05-05 DIAGNOSIS — Z9851 Tubal ligation status: Secondary | ICD-10-CM | POA: Diagnosis not present

## 2015-05-05 DIAGNOSIS — R109 Unspecified abdominal pain: Secondary | ICD-10-CM | POA: Diagnosis not present

## 2015-05-05 DIAGNOSIS — Z1231 Encounter for screening mammogram for malignant neoplasm of breast: Secondary | ICD-10-CM | POA: Diagnosis not present

## 2015-05-05 DIAGNOSIS — F419 Anxiety disorder, unspecified: Secondary | ICD-10-CM | POA: Insufficient documentation

## 2015-05-05 DIAGNOSIS — R0602 Shortness of breath: Secondary | ICD-10-CM | POA: Diagnosis not present

## 2015-05-05 DIAGNOSIS — Z79899 Other long term (current) drug therapy: Secondary | ICD-10-CM | POA: Diagnosis not present

## 2015-05-05 DIAGNOSIS — D649 Anemia, unspecified: Secondary | ICD-10-CM | POA: Diagnosis not present

## 2015-05-05 DIAGNOSIS — Z7951 Long term (current) use of inhaled steroids: Secondary | ICD-10-CM | POA: Diagnosis not present

## 2015-05-05 DIAGNOSIS — Z8739 Personal history of other diseases of the musculoskeletal system and connective tissue: Secondary | ICD-10-CM | POA: Insufficient documentation

## 2015-05-05 DIAGNOSIS — Z86018 Personal history of other benign neoplasm: Secondary | ICD-10-CM | POA: Insufficient documentation

## 2015-05-05 DIAGNOSIS — J441 Chronic obstructive pulmonary disease with (acute) exacerbation: Secondary | ICD-10-CM | POA: Diagnosis not present

## 2015-05-05 DIAGNOSIS — R062 Wheezing: Secondary | ICD-10-CM | POA: Diagnosis not present

## 2015-05-05 DIAGNOSIS — K5730 Diverticulosis of large intestine without perforation or abscess without bleeding: Secondary | ICD-10-CM | POA: Diagnosis not present

## 2015-05-05 DIAGNOSIS — R1030 Lower abdominal pain, unspecified: Secondary | ICD-10-CM | POA: Diagnosis not present

## 2015-05-05 LAB — CBC WITH DIFFERENTIAL/PLATELET
BASOS PCT: 0 %
Basophils Absolute: 0 10*3/uL (ref 0.0–0.1)
Eosinophils Absolute: 0.3 10*3/uL (ref 0.0–0.7)
Eosinophils Relative: 3 %
HEMATOCRIT: 39.5 % (ref 36.0–46.0)
HEMOGLOBIN: 12.5 g/dL (ref 12.0–15.0)
LYMPHS ABS: 4.1 10*3/uL — AB (ref 0.7–4.0)
Lymphocytes Relative: 42 %
MCH: 24.7 pg — AB (ref 26.0–34.0)
MCHC: 31.6 g/dL (ref 30.0–36.0)
MCV: 78.1 fL (ref 78.0–100.0)
MONOS PCT: 6 %
Monocytes Absolute: 0.6 10*3/uL (ref 0.1–1.0)
NEUTROS ABS: 4.7 10*3/uL (ref 1.7–7.7)
NEUTROS PCT: 49 %
Platelets: 255 10*3/uL (ref 150–400)
RBC: 5.06 MIL/uL (ref 3.87–5.11)
RDW: 14.8 % (ref 11.5–15.5)
WBC: 9.6 10*3/uL (ref 4.0–10.5)

## 2015-05-05 LAB — COMPREHENSIVE METABOLIC PANEL
ALBUMIN: 4.4 g/dL (ref 3.5–5.0)
ALK PHOS: 87 U/L (ref 38–126)
ALT: 19 U/L (ref 14–54)
ANION GAP: 10 (ref 5–15)
AST: 25 U/L (ref 15–41)
BILIRUBIN TOTAL: 0.5 mg/dL (ref 0.3–1.2)
BUN: 13 mg/dL (ref 6–20)
CALCIUM: 9.6 mg/dL (ref 8.9–10.3)
CO2: 27 mmol/L (ref 22–32)
CREATININE: 1.1 mg/dL — AB (ref 0.44–1.00)
Chloride: 102 mmol/L (ref 101–111)
GFR calc Af Amer: 57 mL/min — ABNORMAL LOW (ref >60)
GFR calc non Af Amer: 49 mL/min — ABNORMAL LOW (ref >60)
GLUCOSE: 108 mg/dL — AB (ref 65–99)
Potassium: 4.1 mmol/L (ref 3.5–5.1)
Sodium: 139 mmol/L (ref 135–145)
TOTAL PROTEIN: 7.7 g/dL (ref 6.5–8.1)

## 2015-05-05 LAB — URINALYSIS, ROUTINE W REFLEX MICROSCOPIC
BILIRUBIN URINE: NEGATIVE
GLUCOSE, UA: NEGATIVE mg/dL
HGB URINE DIPSTICK: NEGATIVE
Ketones, ur: NEGATIVE mg/dL
Leukocytes, UA: NEGATIVE
Nitrite: NEGATIVE
PH: 6 (ref 5.0–8.0)
Protein, ur: NEGATIVE mg/dL
SPECIFIC GRAVITY, URINE: 1.015 (ref 1.005–1.030)

## 2015-05-05 LAB — TROPONIN I: Troponin I: 0.07 ng/mL — ABNORMAL HIGH (ref <0.031)

## 2015-05-05 LAB — LIPASE, BLOOD: Lipase: 28 U/L (ref 11–51)

## 2015-05-05 MED ORDER — IPRATROPIUM BROMIDE 0.02 % IN SOLN: 0.5000 mg | Freq: Once | RESPIRATORY_TRACT | Status: DC

## 2015-05-05 MED ORDER — IPRATROPIUM-ALBUTEROL 0.5-2.5 (3) MG/3ML IN SOLN
3.0000 mL | Freq: Once | RESPIRATORY_TRACT | Status: AC
  Administered 2015-05-05: 3 mL via RESPIRATORY_TRACT

## 2015-05-05 MED ORDER — MORPHINE SULFATE (PF) 4 MG/ML IV SOLN
4.0000 mg | Freq: Once | INTRAVENOUS | Status: AC
  Administered 2015-05-05: 4 mg via INTRAVENOUS
  Filled 2015-05-05: qty 1

## 2015-05-05 MED ORDER — ALBUTEROL SULFATE (2.5 MG/3ML) 0.083% IN NEBU
INHALATION_SOLUTION | RESPIRATORY_TRACT | Status: AC
  Administered 2015-05-05: 2.5 mg via RESPIRATORY_TRACT
  Filled 2015-05-05: qty 3

## 2015-05-05 MED ORDER — ALBUTEROL SULFATE (2.5 MG/3ML) 0.083% IN NEBU
5.0000 mg | INHALATION_SOLUTION | Freq: Once | RESPIRATORY_TRACT | Status: AC
  Administered 2015-05-05: 5 mg via RESPIRATORY_TRACT
  Filled 2015-05-05: qty 6

## 2015-05-05 MED ORDER — ALBUTEROL SULFATE (2.5 MG/3ML) 0.083% IN NEBU: 5.0000 mg | INHALATION_SOLUTION | Freq: Once | RESPIRATORY_TRACT | Status: DC

## 2015-05-05 MED ORDER — METHYLPREDNISOLONE SODIUM SUCC 125 MG IJ SOLR
125.0000 mg | Freq: Once | INTRAMUSCULAR | Status: AC
  Administered 2015-05-05: 125 mg via INTRAVENOUS
  Filled 2015-05-05: qty 2

## 2015-05-05 MED ORDER — DIATRIZOATE MEGLUMINE & SODIUM 66-10 % PO SOLN
ORAL | Status: AC
  Filled 2015-05-05: qty 30

## 2015-05-05 MED ORDER — ALBUTEROL SULFATE (2.5 MG/3ML) 0.083% IN NEBU
2.5000 mg | INHALATION_SOLUTION | Freq: Once | RESPIRATORY_TRACT | Status: AC
  Administered 2015-05-05: 2.5 mg via RESPIRATORY_TRACT

## 2015-05-05 MED ORDER — IOHEXOL 300 MG/ML  SOLN
100.0000 mL | Freq: Once | INTRAMUSCULAR | Status: AC | PRN
Start: 2015-05-05 — End: 2015-05-05
  Administered 2015-05-05: 100 mL via INTRAVENOUS

## 2015-05-05 MED ORDER — ONDANSETRON HCL 4 MG/2ML IJ SOLN
4.0000 mg | Freq: Once | INTRAMUSCULAR | Status: AC
  Administered 2015-05-05: 4 mg via INTRAVENOUS
  Filled 2015-05-05: qty 2

## 2015-05-05 MED ORDER — IPRATROPIUM-ALBUTEROL 0.5-2.5 (3) MG/3ML IN SOLN
RESPIRATORY_TRACT | Status: AC
  Administered 2015-05-05: 3 mL via RESPIRATORY_TRACT
  Filled 2015-05-05: qty 3

## 2015-05-05 MED ORDER — MORPHINE SULFATE (PF) 2 MG/ML IV SOLN
2.0000 mg | Freq: Once | INTRAVENOUS | Status: AC
Start: 2015-05-05 — End: 2015-05-05
  Administered 2015-05-05: 2 mg via INTRAVENOUS
  Filled 2015-05-05: qty 1

## 2015-05-05 NOTE — ED Notes (Signed)
Pt request something for her "nerves"

## 2015-05-05 NOTE — ED Notes (Signed)
Speaking in complete sentences without shortness of breath. Daughter at bedside. Reports pain at 6/10 for abd pain

## 2015-05-05 NOTE — ED Notes (Addendum)
Pt reports that she woke up with abd pain @ 4 am. Pain above naval. Denies N/V/D.Took pepto at 1pm and flared up asthma. Wheezing, sob and coughing upon arrival. Continues to complain of severe abdominal pain

## 2015-05-05 NOTE — Discharge Instructions (Signed)
SEEK IMMEDIATE MEDICAL ATTENTION IF: The pain does not go away or becomes severe, particularly over the next 8-12 hours.  A temperature above 100.16F develops.  Repeated vomiting occurs (multiple episodes).  The pain becomes localized to portions of the abdomen. The right side could possibly be appendicitis. In an adult, the left lower portion of the abdomen could be colitis or diverticulitis.  Blood is being passed in stools or vomit (bright red or black tarry stools).  Return also if you develop chest pain, difficulty breathing, dizziness or fainting, or become confused, poorly responsive, or inconsolable.   Asthma, Acute Bronchospasm Acute bronchospasm caused by asthma is also referred to as an asthma attack. Bronchospasm means your air passages become narrowed. The narrowing is caused by inflammation and tightening of the muscles in the air tubes (bronchi) in your lungs. This can make it hard to breathe or cause you to wheeze and cough. CAUSES Possible triggers are:  Animal dander from the skin, hair, or feathers of animals.  Dust mites contained in house dust.  Cockroaches.  Pollen from trees or grass.  Mold.  Cigarette or tobacco smoke.  Air pollutants such as dust, household cleaners, hair sprays, aerosol sprays, paint fumes, strong chemicals, or strong odors.  Cold air or weather changes. Cold air may trigger inflammation. Winds increase molds and pollens in the air.  Strong emotions such as crying or laughing hard.  Stress.  Certain medicines such as aspirin or beta-blockers.  Sulfites in foods and drinks, such as dried fruits and wine.  Infections or inflammatory conditions, such as a flu, cold, or inflammation of the nasal membranes (rhinitis).  Gastroesophageal reflux disease (GERD). GERD is a condition where stomach acid backs up into your esophagus.  Exercise or strenuous activity. SIGNS AND SYMPTOMS   Wheezing.  Excessive coughing, particularly at  night.  Chest tightness.  Shortness of breath. DIAGNOSIS  Your health care provider will ask you about your medical history and perform a physical exam. A chest X-ray or blood testing may be performed to look for other causes of your symptoms or other conditions that may have triggered your asthma attack. TREATMENT  Treatment is aimed at reducing inflammation and opening up the airways in your lungs. Most asthma attacks are treated with inhaled medicines. These include quick relief or rescue medicines (such as bronchodilators) and controller medicines (such as inhaled corticosteroids). These medicines are sometimes given through an inhaler or a nebulizer. Systemic steroid medicine taken by mouth or given through an IV tube also can be used to reduce the inflammation when an attack is moderate or severe. Antibiotic medicines are only used if a bacterial infection is present.  HOME CARE INSTRUCTIONS   Rest.  Drink plenty of liquids. This helps the mucus to remain thin and be easily coughed up. Only use caffeine in moderation and do not use alcohol until you have recovered from your illness.  Do not smoke. Avoid being exposed to secondhand smoke.  You play a critical role in keeping yourself in good health. Avoid exposure to things that cause you to wheeze or to have breathing problems.  Keep your medicines up-to-date and available. Carefully follow your health care provider's treatment plan.  Take your medicine exactly as prescribed.  When pollen or pollution is bad, keep windows closed and use an air conditioner or go to places with air conditioning.  Asthma requires careful medical care. See your health care provider for a follow-up as advised. If you are more than [redacted]  weeks pregnant and you were prescribed any new medicines, let your obstetrician know about the visit and how you are doing. Follow up with your health care provider as directed.  After you have recovered from your asthma  attack, make an appointment with your outpatient doctor to talk about ways to reduce the likelihood of future attacks. If you do not have a doctor who manages your asthma, make an appointment with a primary care doctor to discuss your asthma. SEEK IMMEDIATE MEDICAL CARE IF:   You are getting worse.  You have trouble breathing. If severe, call your local emergency services (911 in the [place]  You develop chest pain or discomfort.  You are vomiting.  You are not able to keep fluids down.  You are coughing up yellow, green, brown, or bloody sputum.  You have a fever and your symptoms suddenly get worse.  You have trouble swallowing. MAKE SURE YOU:   Understand these instructions.  Will watch your condition.  Will get help right away if you are not doing well or get worse.   This information is not intended to replace advice given to you by your health care provider. Make sure you discuss any questions you have with your health care provider.   Document Released: 06/16/2006 Document Revised: 03/06/2013 Document Reviewed: 09/06/2012 Elsevier Interactive Patient Education [employer].

## 2015-05-05 NOTE — ED Notes (Signed)
From CT 

## 2015-05-05 NOTE — ED Notes (Signed)
Reports no change in pain levels to her abd.

## 2015-05-05 NOTE — ED Provider Notes (Signed)
CSN: [id number]     Arrival date & time 05/05/15  1410 History   First MD Initiated Contact with Patient 05/05/15 1414     Chief Complaint  Patient presents with  . Wheezing  . Abdominal Pain   LEVEL 5 CAVEAT DUE TO ACUITY OF CONDITION  Patient is a 75 y.o. female presenting with wheezing and abdominal pain. The history is provided by the patient. The history is limited by the condition of the patient.  Wheezing Severity:  Severe Onset quality:  Sudden Duration: SEVERAL HOURS. Timing:  Constant Progression:  Worsening Chronicity:  Recurrent Relieved by:  Nothing Worsened by:  Nothing tried Abdominal Pain PT REPORTS SHE WOKE UP WITH ABDOMINAL PAIN, SHE TOOK PEPTO BISMOL AND THIS FLARED UP HER ASTHMA AND SHE BECAME SHORT OF BREATH  Past Medical History  Diagnosis Date  . COPD (chronic obstructive pulmonary disease) ([place])   . Back pain, chronic   . Anxiety   . Hypertension   . GERD (gastroesophageal reflux disease)   . Shortness of breath     OCCASIONAL  . Arthritis     inflammatory arthritis  . Anemia NOV 2013  . Tubular adenoma 11/2012  . Complication of anesthesia [place] DROPPED AND COULD NOT TAKE PAIN MEDS UNTIL VITALS IMPROVED   Past Surgical History  Procedure Laterality Date  . Hysterotomy    . Tubal ligation    . Abdominal hysterectomy    . Knee arthroscopy Right   . 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 Decompressive lumbar laminectomy level 2  04/20/2012    Procedure: DECOMPRESSIVE LUMBAR LAMINECTOMY LEVEL 2;  Surgeon: Tobi Bastos, MD;  Location: [facility];  Service: Orthopedics;  Laterality: Right;  Decompressive Lumbar Laminectomy of the L4 - L5 and L5 - S1 Complete/Laminectomy L5 on the Right (X-Ray)  . Colonoscopy  06/19/2008    RMR: tortuous and elongated colon with scattered left-sided diverticula/colonic mucosa  appeared entirely normal. Prior colonic ulcers had resolved.  . Esophagogastroduodenoscopy  12/2007    Dr. 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 Small hiatal hernia. Slightly pale duodenal mucosa (biopsy unremarkable)  . Colonoscopy  12/2007    Dr. Gala Romney: Scattered diffuse sigmoid diverticula, 2 areas of ulceration at the hepatic flexure. Biopsies unremarkable.  . Colonoscopy N/A 11/20/2012    Procedure: COLONOSCOPY;  Surgeon: Daneil Dolin, MD;  Location: [facility];  Service: Endoscopy;  Laterality: N/A;  9:30  . Back surgery    . Carpal tunnel release Right 4/14  . Reverse shoulder arthroplasty Right 05/24/2014    Procedure: RIGHT SHOULDER REVERSE ARTHROPLASTY;  Surgeon: Netta Cedars, MD;  Location: [place];  Service: Orthopedics;  Laterality: Right;  . Cataract extraction w/phaco Left 11/19/2014    Procedure: CATARACT EXTRACTION PHACO AND INTRAOCULAR LENS PLACEMENT (IOC);  Surgeon: Williams Che, MD;  Location: [facility];  Service: Ophthalmology;  Laterality: Left;  CDE: 4.77  . Cataract extraction w/phaco Right 02/03/2015    Procedure: CATARACT EXTRACTION PHACO AND INTRAOCULAR LENS PLACEMENT (IOC);  Surgeon: Williams Che, MD;  Location: [facility];  Service: Ophthalmology;  Laterality: Right;  CDE: 4.54   Family History  Problem Relation Age of Onset  . Breast cancer Sister   . Colon cancer Sister     two deceased, one living and terminal, one current  undergoing treatment, ages 59, 56, 34, 48   Social History  Substance Use Topics  . Smoking status: Former Smoker -- 2.00 packs/day for 40 years    Types: Cigarettes    Quit date: 01/19/1992  . Smokeless tobacco: Never Used  . Alcohol Use: No   OB History    Gravida Para Term Preterm AB TAB SAB Ectopic Multiple Living   3 3 2 1      3      Review of Systems  Unable to perform ROS: Acuity of condition  Respiratory: Positive for wheezing.   Gastrointestinal: Positive for abdominal  pain.      Allergies  Flexeril; Other; Keflex; Statins; Adhesive; and Chlorhexidine  Home Medications   Prior to Admission medications   Medication Sig Start Date End Date Taking? Authorizing Provider  albuterol (PROAIR HFA) 108 (90 BASE) MCG/ACT inhaler Inhale 2 puffs into the lungs every 6 (six) hours as needed for wheezing or shortness of breath. For COPD    Historical Provider, MD  ALPRAZolam Duanne Moron) 0.5 MG tablet Take 0.5 mg by mouth 4 (four) times daily as needed for anxiety. For anxiety    Historical Provider, MD  beclomethasone (QVAR) 40 MCG/ACT inhaler Inhale 2 puffs into the lungs 2 (two) times daily.    Historical Provider, MD  diphenhydrAMINE (BENADRYL) 25 MG tablet Take 25 mg by mouth every 8 (eight) hours as needed. For allergies    Historical Provider, MD  docusate sodium (COLACE) 100 MG capsule Take 100 mg by mouth daily.    Historical Provider, MD  EPINEPHrine (EPIPEN 2-PAK) 0.3 mg/0.3 mL DEVI Inject 0.3 mg into the muscle once. As needed for tree nut allergy    Historical Provider, MD  ferrous sulfate 325 (65 FE) MG tablet Take 325 mg by mouth daily with breakfast.    Historical Provider, MD  folic acid (FOLVITE) 1 MG tablet Take 1 mg by mouth daily.    Historical Provider, MD  loratadine (CLARITIN) 10 MG tablet Take 10 mg by mouth daily. Currently on hold    Historical Provider, MD  oxyCODONE-acetaminophen (PERCOCET/ROXICET) 5-325 MG per tablet Take 1 tablet by mouth every 4 (four) hours as needed for moderate pain.     Historical Provider, MD  pantoprazole (PROTONIX) 40 MG tablet Take 40 mg by mouth daily.    Historical Provider, MD  telmisartan-hydrochlorothiazide (MICARDIS HCT) 80-12.5 MG per tablet Take 1 tablet by mouth daily.  10/09/12   Historical Provider, MD   BP 121/84 mmHg  Pulse 117  Temp(Src) 98.3 F (36.8 C) (Oral)  Resp 22  Ht 5\' 3"  (1.6 m)  Wt 86.183 kg  BMI 33.67 kg/m2  SpO2 99% Physical Exam CONSTITUTIONAL: anxious, distress HEAD:  Normocephalic/atraumatic EYES: EOMI ENMT: Mucous membranes moist NECK: supple no meningeal signs CV: tachycardic LUNGS: distress noted, tachypneic, wheezing bilaterally ABDOMEN: soft, nontender, no rebound or guarding, bowel sounds noted throughout abdomen NEURO: Pt is awake/alert/appropriate, moves all extremitiesx4. EXTREMITIES: no LE edema noted SKIN: warm, color normal PSYCH - anxious  ED Course  Procedures   2:52 PM Pt was in significant distress on arrival requiring immediate nebulized treatment Will follow closely 3:55 PM resp status is improved.  She is now in no distress She reports continued epigastric and LLQ abd pain No vomiting/diarrhea/bloody/dark stools No CP Her pain started early this morning, then after taking Pepto she had wheezing Due to persistent abd pain will proceed with CT imaging of abdomen 7:15 PM CT scan negative for acute disease She  has mild abd tenderness but no rigidity No vomiting She reports she only feels anxious Lung sounds have improved She wants to go home (I offered admission) She reports she has all of her meds at home, and she refuses prednisone Troponin mildly elevated though suspect due to tachycardia earlier during asthma attack (she denies ever having CP) Will d/c home BP 112/45 mmHg  Pulse 94  Temp(Src) 98.3 F (36.8 C) (Oral)  Resp 13  Ht 5\' 3"  (1.6 m)  Wt 86.183 kg  BMI 33.67 kg/m2  SpO2 95%  Labs Review Labs Reviewed  CBC WITH DIFFERENTIAL/PLATELET - Abnormal; Notable for the following:    MCH 24.7 (*)    Lymphs Abs 4.1 (*)    All other components within normal limits  COMPREHENSIVE METABOLIC PANEL - Abnormal; Notable for the following:    Glucose, Bld 108 (*)    Creatinine, Ser 1.10 (*)    GFR calc non Af Amer 49 (*)    GFR calc Af Amer 57 (*)    All other components within normal limits  TROPONIN I - Abnormal; Notable for the following:    Troponin I 0.07 (*)    All other components within normal limits   LIPASE, BLOOD  URINALYSIS, ROUTINE W REFLEX MICROSCOPIC (NOT AT [facility])    Imaging Review Ct Abdomen Pelvis W Contrast  05/05/2015  CLINICAL DATA:  Awoke with abdominal pain 12 hours prior. Pain in the supraumbilical area. EXAM: CT ABDOMEN AND PELVIS WITH CONTRAST TECHNIQUE: Multidetector CT imaging of the abdomen and pelvis was performed using the standard protocol following bolus administration of intravenous contrast. CONTRAST:  161mL OMNIPAQUE IOHEXOL 300 MG/ML  SOLN COMPARISON:  CT 03/02/2014. Additional prior CTs, dating back to 05/2011 FINDINGS: Lower chest: Unchanged lingular atelectasis or scarring. Tortuosity of the distal descending thoracic aorta. No pleural effusion. Liver: Hypodense lesion in the right hepatic dome measuring 1.2 x 1.6 cm is not significantly changed compared to prior exam allowing for differences in technique and measurement placement. No new focal hepatic lesion. Small calcified densities in the porta hepatis, may be sequela of prior granulomatous disease. Hepatobiliary: Gallbladder physiologically distended, no calcified stone. No biliary dilatation. Pancreas: No ductal dilatation or inflammation. Spleen: Normal. Adrenal glands: No nodule. Kidneys: Symmetric renal enhancement. No hydronephrosis. No perinephric stranding or focal renal abnormality. Stomach/Bowel: Stomach physiologically distended. Small hiatal hernia. There are no dilated or thickened small bowel loops. Mild colonic diverticulosis in the distal colon without acute diverticulitis. Moderate stool in the right colon, with small stool burden distally. The appendix is normal. Vascular/Lymphatic: No retroperitoneal adenopathy. Abdominal aorta is normal in caliber. Moderate atherosclerosis without aneurysm. Reproductive: The uterus is surgically absent.  No adnexal mass. Bladder: Minimally distended, mild displacement of the right, unchanged from prior exam. Other: No free air, free fluid, or intra-abdominal fluid  collection. Musculoskeletal: There are no acute or suspicious osseous abnormalities. 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 IMPRESSION: 1. No acute abnormality in the abdomen/pelvis. 2. Mild distal colonic diverticulosis without diverticulitis. Electronically Signed   By: Jeb Levering M.D.   On: 05/05/2015 18:58   Dg Chest Portable 1 View  05/05/2015  CLINICAL DATA:  Wheezing, shortness of breath, cough today EXAM: PORTABLE CHEST 1 VIEW COMPARISON:  10/15/2013 FINDINGS: Cardiomediastinal silhouette is stable. No acute infiltrate or pleural effusion. No pulmonary edema. There is a left shoulder prosthesis. IMPRESSION: No active disease. Electronically Signed   By: Lahoma Crocker M.D.   On: 05/05/2015 14:49  I have personally reviewed and evaluated these images and lab results as part of my medical decision-making.   EKG Interpretation   Date/Time:  Monday May 05 2015 14:22:42 EST Ventricular Rate:  124 PR Interval:  142 QRS Duration: 89 QT Interval:  312 QTC Calculation: 448 R Axis:   16 Text Interpretation:  Sinus tachycardia Low voltage, extremity and  precordial leads Baseline wander in lead(s) II III aVR aVL aVF V1 V3 V4 V5  V6 artifact noted Confirmed by Christy Gentles  MD, Cielle Aguila ([id number]) on 05/05/2015  2:43:03 PM     Medications  methylPREDNISolone sodium succinate (SOLU-MEDROL) 125 mg/2 mL injection 125 mg (125 mg Intravenous Given 05/05/15 1427)  ipratropium-albuterol (DUONEB) 0.5-2.5 (3) MG/3ML nebulizer solution 3 mL (3 mLs Nebulization Given 05/05/15 1430)  albuterol (PROVENTIL) (2.5 MG/3ML) 0.083% nebulizer solution 2.5 mg (2.5 mg Nebulization Given 05/05/15 1430)  morphine 4 MG/ML injection 4 mg (4 mg Intravenous Given 05/05/15 1510)  ondansetron (ZOFRAN) injection 4 mg (4 mg Intravenous Given 05/05/15 1510)  morphine 2 MG/ML injection 2 mg (2 mg Intravenous Given 05/05/15 1646)  albuterol (PROVENTIL) (2.5 MG/3ML) 0.083% nebulizer solution 5 mg (5 mg  Nebulization Given 05/05/15 1727)  iohexol (OMNIPAQUE) 300 MG/ML solution 100 mL (100 mLs Intravenous Contrast Given 05/05/15 1829)    MDM   Final diagnoses:  Abdominal pain  Asthma exacerbation  Anxiety    Nursing notes including past medical history and social history reviewed and considered in documentation Labs/vital reviewed myself and considered during evaluation xrays/imaging reviewed by myself and considered during evaluation     Ripley Fraise, MD 05/05/15 1916

## 2015-05-05 NOTE — ED Notes (Signed)
Pt reports abd pain since 4am states that she has a "hole" in her stomach- hx of h pyloric, reports that pain as sharp- has not taken her meds today due to pain.

## 2015-05-05 NOTE — ED Notes (Signed)
To CT via stretcher

## 2015-05-06 ENCOUNTER — Telehealth: Payer: Self-pay | Admitting: Emergency Medicine

## 2015-05-06 ENCOUNTER — Other Ambulatory Visit: Payer: Self-pay

## 2015-05-06 NOTE — Patient Outreach (Signed)
[facility]) Care Management  05/06/2015  VIRGEN KROCKER [birth year] [id number]  Return phone call to patient.  She reports she went to the emergency room yesterday for asthma and abdominal pain.  She reports that her breathing was bad and she was given medication for it and for her stomach. Patient reports that a CT Scan was done to her stomach but nothing was found. Patient reports today that she has a headache and rates pain about a 4/10.  She reports she got a follow up call from the nurse at [facility] today and was directed to take tylenol.  She says she still has some pain.  Advised patient to contact Dr. [name] office for follow up and notification for possible visit.  Patient states she will call once she is off the phone. Offered patient health coach services through [place] but patient declined stating she normally has done well and thinks she will be ok.   [name], RN, MSN [place] [phone]

## 2015-05-13 ENCOUNTER — Other Ambulatory Visit: Payer: Self-pay | Admitting: Licensed Clinical Social Worker

## 2015-05-13 NOTE — Patient Outreach (Signed)
Assessment:  CSW called client home phone number on 05/13/15 and spoke via phone with client on 05/13/15. CSW verified client identity. CSW and client spoke of client needs.  Client said she had gone to the emergency room at [facility] last week. She received a CT scan at the hospital; she said she CT scan was clear.  She said she has her prescribed medications and is taking medications as prescribed.  She said she is sleeping well. She is eating well.  She has joined the [employer] locally and is starting to exercise at [facility].  She uses treadmill, rides bike, and does water exercises.  She said she is feeling better since she started exercising at the [facility]. She has family support.  She said she did not feel that  she had any nursing needs at present. Client and CSW spoke of her care plan goal. CSW encouraged client to attend all scheduled client medical appointments in next 30 days.  CSW congratulated client on her beginning to exercise at [employer]. CSW encouraged client to call CSW at [phone] as needed to discuss social work needs of client. CSW thanked [place] for phone conversation with CSW on 05/13/15.   Plan; Client to attend all scheduled client medical appointments in next 30 days. CSW to call client in 4 weeks to assess client needs.  Norva Riffle.Haldon Carley MSW, LCSW Licensed Clinical Social Worker [facility] Care Management [phone]       :

## 2015-06-10 ENCOUNTER — Other Ambulatory Visit: Payer: Self-pay | Admitting: Licensed Clinical Social Worker

## 2015-06-10 ENCOUNTER — Encounter: Payer: Self-pay | Admitting: Licensed Clinical Social Worker

## 2015-06-10 NOTE — Patient Outreach (Signed)
Assessment:  CSW called client on 06/10/15 and spoke via phone with client on 06/10/15.  CSW verified client identity. CSW and client spoke of client needs. Client said she has her prescribed medications and is taking medications as prescribed. She said she is attending scheduled medical appointments. She did not mention transport needs at this time.  She said she is learning to manage pain issues and has talked with her medical providers about pain issues of client. Client said she is eating well and sleeping adequately. Client said she has support from her adult children and that this support from her adult children is helping her.  CSW informed client that client had met care plan goals of client and thus CSW would be discharging client on 06/10/15 from [place] [facility] (since client had met client care plan goals).  Client agreed to this plan. She was appreciative of support she had received through the [facility] program.     Plan:  CSW is discharging Glenda Terry from [place] services on 06/10/15 since client has met her care plan goals with CSW services. CSW to inform Glenda Terry that Glenda Terry discharged client on 06/10/15 from [place] [facility]. CSW to fax physician case closure letter to Glenda Terry informing Glenda Terry that Glenda Terry discharged client on 06/10/15 from [facility] CSW services.  Norva Riffle.Riona Lahti MSW, LCSW Licensed Clinical Social Worker [facility] Care Management [phone]

## 2015-06-21 ENCOUNTER — Encounter (HOSPITAL_COMMUNITY): Payer: Self-pay | Admitting: Emergency Medicine

## 2015-06-21 ENCOUNTER — Emergency Department (HOSPITAL_COMMUNITY): Payer: PPO

## 2015-06-21 ENCOUNTER — Emergency Department (HOSPITAL_COMMUNITY)
Admission: EM | Admit: 2015-06-21 | Discharge: 2015-06-22 | Disposition: A | Discharge status: Home / Self Care | Payer: PPO | Attending: Emergency Medicine | Admitting: Emergency Medicine

## 2015-06-21 DIAGNOSIS — J441 Chronic obstructive pulmonary disease with (acute) exacerbation: Secondary | ICD-10-CM | POA: Insufficient documentation

## 2015-06-21 DIAGNOSIS — M1990 Unspecified osteoarthritis, unspecified site: Secondary | ICD-10-CM | POA: Diagnosis not present

## 2015-06-21 DIAGNOSIS — I10 Essential (primary) hypertension: Secondary | ICD-10-CM | POA: Diagnosis not present

## 2015-06-21 DIAGNOSIS — R05 Cough: Secondary | ICD-10-CM | POA: Diagnosis not present

## 2015-06-21 DIAGNOSIS — Z87891 Personal history of nicotine dependence: Secondary | ICD-10-CM | POA: Insufficient documentation

## 2015-06-21 DIAGNOSIS — R0602 Shortness of breath: Secondary | ICD-10-CM | POA: Diagnosis not present

## 2015-06-21 LAB — BASIC METABOLIC PANEL
ANION GAP: 8 (ref 5–15)
BUN: 17 mg/dL (ref 6–20)
CO2: 25 mmol/L (ref 22–32)
Calcium: 8.8 mg/dL — ABNORMAL LOW (ref 8.9–10.3)
Chloride: 105 mmol/L (ref 101–111)
Creatinine, Ser: 1.24 mg/dL — ABNORMAL HIGH (ref 0.44–1.00)
GFR calc Af Amer: 49 mL/min — ABNORMAL LOW (ref >60)
GFR calc non Af Amer: 42 mL/min — ABNORMAL LOW (ref >60)
GLUCOSE: 116 mg/dL — AB (ref 65–99)
POTASSIUM: 3.8 mmol/L (ref 3.5–5.1)
Sodium: 138 mmol/L (ref 135–145)

## 2015-06-21 LAB — CBC WITH DIFFERENTIAL/PLATELET
BASOS ABS: 0 10*3/uL (ref 0.0–0.1)
Basophils Relative: 0 %
Eosinophils Absolute: 0.2 10*3/uL (ref 0.0–0.7)
Eosinophils Relative: 2 %
HEMATOCRIT: 35.8 % — AB (ref 36.0–46.0)
Hemoglobin: 11.2 g/dL — ABNORMAL LOW (ref 12.0–15.0)
LYMPHS PCT: 45 %
Lymphs Abs: 3 10*3/uL (ref 0.7–4.0)
MCH: 24.3 pg — ABNORMAL LOW (ref 26.0–34.0)
MCHC: 31.3 g/dL (ref 30.0–36.0)
MCV: 77.8 fL — AB (ref 78.0–100.0)
MONO ABS: 0.6 10*3/uL (ref 0.1–1.0)
Monocytes Relative: 9 %
NEUTROS ABS: 3 10*3/uL (ref 1.7–7.7)
Neutrophils Relative %: 44 %
Platelets: 222 10*3/uL (ref 150–400)
RBC: 4.6 MIL/uL (ref 3.87–5.11)
RDW: 14.8 % (ref 11.5–15.5)
WBC: 6.8 10*3/uL (ref 4.0–10.5)

## 2015-06-21 LAB — TROPONIN I: Troponin I: <0.03 ng/mL (ref <0.031)

## 2015-06-21 MED ORDER — IPRATROPIUM-ALBUTEROL 0.5-2.5 (3) MG/3ML IN SOLN
3.0000 mL | Freq: Once | RESPIRATORY_TRACT | Status: AC
  Administered 2015-06-21: 3 mL via RESPIRATORY_TRACT
  Filled 2015-06-21: qty 3

## 2015-06-21 MED ORDER — ALBUTEROL SULFATE (2.5 MG/3ML) 0.083% IN NEBU
INHALATION_SOLUTION | RESPIRATORY_TRACT | Status: AC
  Administered 2015-06-21: 5 mg
  Filled 2015-06-21: qty 6

## 2015-06-21 MED ORDER — ALBUTEROL (5 MG/ML) CONTINUOUS INHALATION SOLN
INHALATION_SOLUTION | RESPIRATORY_TRACT | Status: AC
  Administered 2015-06-21: 2 mL
  Filled 2015-06-21: qty 20

## 2015-06-21 MED ORDER — IPRATROPIUM BROMIDE 0.02 % IN SOLN
RESPIRATORY_TRACT | Status: AC
  Administered 2015-06-21: 0.5 mg
  Filled 2015-06-21: qty 2.5

## 2015-06-21 MED ORDER — METHYLPREDNISOLONE SODIUM SUCC 125 MG IJ SOLR
125.0000 mg | Freq: Once | INTRAMUSCULAR | Status: AC
  Administered 2015-06-21: 125 mg via INTRAVENOUS
  Filled 2015-06-21: qty 2

## 2015-06-21 MED ORDER — DOXYCYCLINE HYCLATE 100 MG PO CAPS: 100.0000 mg | ORAL_CAPSULE | Freq: Two times a day (BID) | ORAL | Status: DC

## 2015-06-21 MED ORDER — DOXYCYCLINE HYCLATE 100 MG PO TABS
100.0000 mg | ORAL_TABLET | Freq: Once | ORAL | Status: AC
  Administered 2015-06-21: 100 mg via ORAL
  Filled 2015-06-21: qty 1

## 2015-06-21 MED ORDER — LORAZEPAM 2 MG/ML IJ SOLN
0.5000 mg | Freq: Once | INTRAMUSCULAR | Status: AC
  Administered 2015-06-21: 0.5 mg via INTRAVENOUS
  Filled 2015-06-21: qty 1

## 2015-06-21 MED ORDER — PREDNISONE 10 MG PO TABS: 10.0000 mg | ORAL_TABLET | Freq: Every day | ORAL | Status: DC

## 2015-06-21 NOTE — Discharge Instructions (Signed)
Chest x-ray showed no pneumonia. Prescription for antibiotic and prednisone. Continue nebulizer treatments. Follow-up your primary care doctor.

## 2015-06-21 NOTE — ED Provider Notes (Signed)
CSN: [id number]     Arrival date & time 06/21/15  2047 History  By signing my name below, I, [facility], attest that this documentation has been prepared under the direction and in the presence of Nat Christen, MD. Electronically Signed: Virgel Bouquet, ED Scribe. 06/21/2015. 11:39 PM.   Chief Complaint  Patient presents with  . Shortness of Breath   LEVEL 5 CAVEAT DUE TO ACUITY OF PATIENT  The history is provided by the patient. The history is limited by the condition of the patient. No language interpreter was used.  HPI Comments: Glenda Terry is a 75 y.o. female with a PMHx of COPD who presents to the Emergency Department complaining of intermittent, moderate cough and wheezing onset 3 days ago. Patient reports that she has been coughing up clear phlegm for the past 3 days. She has taken albuterol without relief. She notes similar symptoms in the past that she associated with COPD. Denies any other symptoms currently.  Past Medical History  Diagnosis Date  . COPD (chronic obstructive pulmonary disease) ([place])   . Back pain, chronic   . Anxiety   . Hypertension   . GERD (gastroesophageal reflux disease)   . Shortness of breath     OCCASIONAL  . Arthritis     inflammatory arthritis  . Anemia NOV 2013  . Tubular adenoma 11/2012  . Complication of anesthesia [place] DROPPED AND COULD NOT TAKE PAIN MEDS UNTIL VITALS IMPROVED   Past Surgical History  Procedure Laterality Date  . Hysterotomy    . Tubal ligation    . Abdominal hysterectomy    . Knee arthroscopy Right   . 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 Decompressive lumbar laminectomy level 2  04/20/2012    Procedure: DECOMPRESSIVE LUMBAR LAMINECTOMY LEVEL 2;  Surgeon: Tobi Bastos, MD;  Location: [facility] ORS;  Service: Orthopedics;  Laterality: Right;  Decompressive  Lumbar Laminectomy of the L4 - L5 and L5 - S1 Complete/Laminectomy L5 on the Right (X-Ray)  . Colonoscopy  06/19/2008    RMR: tortuous and elongated colon with scattered left-sided diverticula/colonic mucosa appeared entirely normal. Prior colonic ulcers had resolved.  . Esophagogastroduodenoscopy  12/2007    Dr. 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 Small hiatal hernia. Slightly pale duodenal mucosa (biopsy unremarkable)  . Colonoscopy  12/2007    Dr. Gala Romney: Scattered diffuse sigmoid diverticula, 2 areas of ulceration at the hepatic flexure. Biopsies unremarkable.  . Colonoscopy N/A 11/20/2012    Procedure: COLONOSCOPY;  Surgeon: Daneil Dolin, MD;  Location: [facility];  Service: Endoscopy;  Laterality: N/A;  9:30  . Back surgery    . Carpal tunnel release Right 4/14  . Reverse shoulder arthroplasty Right 05/24/2014    Procedure: RIGHT SHOULDER REVERSE ARTHROPLASTY;  Surgeon: Netta Cedars, MD;  Location: [place];  Service: Orthopedics;  Laterality: Right;  . Cataract extraction w/phaco Left 11/19/2014    Procedure: CATARACT EXTRACTION PHACO AND INTRAOCULAR LENS PLACEMENT (IOC);  Surgeon: Williams Che, MD;  Location: [facility];  Service: Ophthalmology;  Laterality: Left;  CDE: 4.77  . Cataract extraction w/phaco Right 02/03/2015    Procedure: CATARACT EXTRACTION PHACO AND INTRAOCULAR LENS PLACEMENT (IOC);  Surgeon: Williams Che, MD;  Location: [facility];  Service: Ophthalmology;  Laterality: Right;  CDE: 4.54   Family  History  Problem Relation Age of Onset  . Breast cancer Sister   . Colon cancer Sister     two deceased, one living and terminal, one current undergoing treatment, ages 58, 67, 71, 76   Social History  Substance Use Topics  . Smoking status: Former Smoker -- 2.00 packs/day for 40 years    Types: Cigarettes    Quit date: 01/19/1992  . Smokeless tobacco: Never Used  . Alcohol Use: No   OB History    Gravida Para Term  Preterm AB TAB SAB Ectopic Multiple Living   3 3 2 1      3      Review of Systems  Unable to perform ROS: Acuity of condition    Allergies  Flexeril; Other; Keflex; Aspirin; Statins; Adhesive; and Chlorhexidine  Home Medications   Prior to Admission medications   Medication Sig Start Date End Date Taking? Authorizing Provider  albuterol (ACCUNEB) 0.63 MG/3ML nebulizer solution Take 1 ampule by nebulization every 6 (six) hours as needed for wheezing or shortness of breath.  04/10/15   Historical Provider, MD  albuterol (PROAIR HFA) 108 (90 BASE) MCG/ACT inhaler Inhale 2 puffs into the lungs every 6 (six) hours as needed for wheezing or shortness of breath. For COPD    Historical Provider, MD  ALPRAZolam Duanne Moron) 0.5 MG tablet Take 0.5 mg by mouth 4 (four) times daily as needed for anxiety. For anxiety    Historical Provider, MD  beclomethasone (QVAR) 40 MCG/ACT inhaler Inhale 2 puffs into the lungs 2 (two) times daily.    Historical Provider, MD  bismuth subsalicylate (PEPTO BISMOL) 262 MG/15ML suspension Take 30 mLs by mouth every 6 (six) hours as needed for indigestion or diarrhea or loose stools.    Historical Provider, MD  doxycycline (VIBRAMYCIN) 100 MG capsule Take 1 capsule (100 mg total) by mouth 2 (two) times daily. 06/21/15   Nat Christen, MD  EPINEPHrine (EPIPEN 2-PAK) 0.3 mg/0.3 mL DEVI Inject 0.3 mg into the muscle once. As needed for tree nut allergy    Historical Provider, MD  ferrous sulfate 325 (65 FE) MG tablet Take 325 mg by mouth daily with breakfast.    Historical Provider, MD  folic acid (FOLVITE) 1 MG tablet Take 1 mg by mouth daily.    Historical Provider, MD  loratadine (CLARITIN) 10 MG tablet Take 10 mg by mouth daily.     Historical Provider, MD  montelukast (SINGULAIR) 10 MG tablet Take 10 mg by mouth daily.  04/12/15   Historical Provider, MD  oxyCODONE-acetaminophen (PERCOCET/ROXICET) 5-325 MG per tablet Take 1 tablet by mouth every 6 (six) hours as needed for moderate  pain.     Historical Provider, MD  pantoprazole (PROTONIX) 40 MG tablet Take 40 mg by mouth daily.    Historical Provider, MD  predniSONE (DELTASONE) 10 MG tablet Take 1 tablet (10 mg total) by mouth daily with breakfast. 06/21/15   Nat Christen, MD  telmisartan-hydrochlorothiazide (MICARDIS HCT) 80-12.5 MG per tablet Take 1 tablet by mouth daily.  10/09/12   Historical Provider, MD  trolamine salicylate (ASPERCREME) 10 % cream Apply 1 application topically as needed for muscle pain (applied to ankle).    Historical Provider, MD   BP 136/68 mmHg  Pulse 102  Temp(Src) 98.7 F (37.1 C)  Resp 23  Ht 5' (1.524 m)  Wt 185 lb (83.915 kg)  BMI 36.13 kg/m2  SpO2 100% Physical Exam  Constitutional: She is oriented to person, place, and time. She appears well-developed  and well-nourished.  Coughing and spitting up clear phlegm.  HENT:  Head: Normocephalic and atraumatic.  Eyes: Conjunctivae and EOM are normal. Pupils are equal, round, and reactive to light.  Neck: Normal range of motion. Neck supple.  Cardiovascular: Normal rate and regular rhythm.   Pulmonary/Chest: Effort normal. Tachypnea noted. She has wheezes.  Bilateral expiratory wheezes and tachypnea.  Abdominal: Soft. Bowel sounds are normal.  Musculoskeletal: Normal range of motion.  Neurological: She is alert and oriented to person, place, and time.  Skin: Skin is warm and dry.  Psychiatric: She has a normal mood and affect. Her behavior is normal.  Nursing note and vitals reviewed.   ED Course  Procedures   DIAGNOSTIC STUDIES: Oxygen Saturation is 99% on RA, normal by my interpretation.    COORDINATION OF CARE: 9:03 PM Will order breathing treatment, IV steroids, chest x-ray. Discussed treatment plan with pt at bedside and pt agreed to plan.  9:27 PM Returned to re-evaluate pt. Pt has improved. Will continue to monitor and return to re-evaluate pt.  Labs Review Labs Reviewed  CBC WITH DIFFERENTIAL/PLATELET - Abnormal;  Notable for the following:    Hemoglobin 11.2 (*)    HCT 35.8 (*)    MCV 77.8 (*)    MCH 24.3 (*)    All other components within normal limits  BASIC METABOLIC PANEL - Abnormal; Notable for the following:    Glucose, Bld 116 (*)    Creatinine, Ser 1.24 (*)    Calcium 8.8 (*)    GFR calc non Af Amer 42 (*)    GFR calc Af Amer 49 (*)    All other components within normal limits  TROPONIN I    Imaging Review Dg Chest Port 1 View  06/21/2015  CLINICAL DATA:  Cough, body aches and back pain.  Duration 5 days. EXAM: PORTABLE CHEST 1 VIEW COMPARISON:  05/05/2015 FINDINGS: A single AP portable view of the chest demonstrates no focal airspace consolidation or alveolar edema. The lungs are grossly clear. There is no large effusion or pneumothorax. Cardiac and mediastinal contours appear unremarkable. IMPRESSION: No active disease. Electronically Signed   By: Andreas Newport M.D.   On: 06/21/2015 21:24   I have personally reviewed and evaluated these images and lab results as part of my medical decision-making.   EKG Interpretation   Date/Time:  Saturday June 21 2015 20:56:57 EDT Ventricular Rate:  129 PR Interval:  144 QRS Duration: 83 QT Interval:  310 QTC Calculation: 454 R Axis:   12 Text Interpretation:  Sinus tachycardia Borderline repolarization  abnormality Baseline wander in lead(s) II III aVR aVL aVF V1 V2 V3 V4 V5  V6 Confirmed by Bowdy Bair  MD, Jones Viviani ([id number]) on 06/21/2015 9:23:30 PM     CRITICAL CARE Performed by: Nat Christen Total critical care time: 30 minutes Critical care time was exclusive of separately billable procedures and treating other patients. Critical care was necessary to treat or prevent imminent or life-threatening deterioration. Critical care was time spent personally by me on the following activities: development of treatment plan with patient and/or surrogate as well as nursing, discussions with consultants, evaluation of patient's response to treatment,  examination of patient, obtaining history from patient or surrogate, ordering and performing treatments and interventions, ordering and review of laboratory studies, ordering and review of radiographic studies, pulse oximetry and re-evaluation of patient's condition. MDM   Final diagnoses:  COPD exacerbation ([place])    Patient feels much better after continuous nebulization treatment and IV steroids.  Chest x-ray shows no pneumonia. Troponin negative. Pulse has normalized. Oxygenation normal. Discharge medications doxycycline 100 mg and prednisone 10 mg  I personally performed the services described in this documentation, which was scribed in my presence. The recorded information has been reviewed and is accurate.     Nat Christen, MD 06/21/15 2340

## 2015-06-21 NOTE — ED Notes (Signed)
Pt reports cough, body aches and back pain since Tues.

## 2015-06-23 DIAGNOSIS — J441 Chronic obstructive pulmonary disease with (acute) exacerbation: Secondary | ICD-10-CM | POA: Diagnosis not present

## 2015-06-23 DIAGNOSIS — R05 Cough: Secondary | ICD-10-CM | POA: Diagnosis not present

## 2015-06-23 DIAGNOSIS — R062 Wheezing: Secondary | ICD-10-CM | POA: Diagnosis not present

## 2015-07-15 DIAGNOSIS — M545 Low back pain: Secondary | ICD-10-CM | POA: Diagnosis not present

## 2015-07-16 DIAGNOSIS — J4520 Mild intermittent asthma, uncomplicated: Secondary | ICD-10-CM | POA: Diagnosis not present

## 2015-07-16 DIAGNOSIS — R0602 Shortness of breath: Secondary | ICD-10-CM | POA: Diagnosis not present

## 2015-07-16 DIAGNOSIS — Z87891 Personal history of nicotine dependence: Secondary | ICD-10-CM | POA: Diagnosis not present

## 2015-07-16 DIAGNOSIS — G4733 Obstructive sleep apnea (adult) (pediatric): Secondary | ICD-10-CM | POA: Diagnosis not present

## 2015-07-30 DIAGNOSIS — L821 Other seborrheic keratosis: Secondary | ICD-10-CM | POA: Diagnosis not present

## 2015-07-30 DIAGNOSIS — L568 Other specified acute skin changes due to ultraviolet radiation: Secondary | ICD-10-CM | POA: Diagnosis not present

## 2015-08-14 DIAGNOSIS — M5442 Lumbago with sciatica, left side: Secondary | ICD-10-CM | POA: Diagnosis not present

## 2015-08-21 ENCOUNTER — Ambulatory Visit: Payer: PPO | Admitting: Gastroenterology

## 2015-08-21 ENCOUNTER — Ambulatory Visit (INDEPENDENT_AMBULATORY_CARE_PROVIDER_SITE_OTHER): Payer: PPO | Admitting: Gastroenterology

## 2015-08-21 ENCOUNTER — Encounter: Payer: Self-pay | Admitting: Gastroenterology

## 2015-08-21 VITALS — BP 130/93 | HR 103 | Temp 97.4°F | Ht 60.0 in | Wt 197.0 lb

## 2015-08-21 DIAGNOSIS — K648 Other hemorrhoids: Secondary | ICD-10-CM | POA: Diagnosis not present

## 2015-08-21 DIAGNOSIS — M419 Scoliosis, unspecified: Secondary | ICD-10-CM | POA: Diagnosis not present

## 2015-08-21 DIAGNOSIS — Z800 Family history of malignant neoplasm of digestive organs: Secondary | ICD-10-CM

## 2015-08-21 DIAGNOSIS — D509 Iron deficiency anemia, unspecified: Secondary | ICD-10-CM

## 2015-08-21 DIAGNOSIS — M4806 Spinal stenosis, lumbar region: Secondary | ICD-10-CM | POA: Diagnosis not present

## 2015-08-21 DIAGNOSIS — Z9889 Other specified postprocedural states: Secondary | ICD-10-CM | POA: Diagnosis not present

## 2015-08-21 DIAGNOSIS — M545 Low back pain: Secondary | ICD-10-CM | POA: Diagnosis not present

## 2015-08-21 DIAGNOSIS — M47816 Spondylosis without myelopathy or radiculopathy, lumbar region: Secondary | ICD-10-CM | POA: Diagnosis not present

## 2015-08-21 MED ORDER — HYDROCORTISONE 2.5 % RE CREA: 1.0000 "application " | TOPICAL_CREAM | Freq: Two times a day (BID) | RECTAL | Status: DC

## 2015-08-21 NOTE — Progress Notes (Signed)
Primary Care Physician:  Wende Neighbors, MD  Primary Gastroenterologist:  Garfield Cornea, MD   Chief Complaint  Patient presents with  . hard time emptying bowels all the way    HPI:  Glenda Terry is a 75 y.o. female here for further evaluation of rectal issues and difficulty completing bowel movement. Last seen in 2014 at time of surveillance colonoscopy. FH strongly positive for colon cancer, 4 sisters, maternal grandfather, maternal aunts, and several cousins. Patient's last colonoscopy 11/2012 with minimal anal canal hemorrhoids, scattered Left sided and transverse diverticula, 4 mm ascending colon adenoma. Distal 5 cm over the TI was normal.  Patient complains off several month history of difficulty with bowel function. Last 3-4 months symptoms worsen. Feels like she never completes her BM. Has to go back several times. Stool is soft. No blood or melena. Goes through a whole toilet paper roll daily but causes difficult to clean up after BM. She has tried Preparation H ointments, tucks pads. She denies abdominal pain. Her appetite is good. No upper GI symptoms. She feels like her bowel issues are worse since her back surgery 2014. She denies chronic pain medication. Currently having back issues and has completed MRI recently but has not received results.Feels like cannot get cleaned. Uses a whole roll of toilet paper daily. Feels cannot ever get every thing out.      Current Outpatient Prescriptions  Medication Sig Dispense Refill  . albuterol (ACCUNEB) 0.63 MG/3ML nebulizer solution Take 1 ampule by nebulization every 6 (six) hours as needed for wheezing or shortness of breath.     Marland Kitchen albuterol (PROAIR HFA) 108 (90 BASE) MCG/ACT inhaler Inhale 2 puffs into the lungs every 6 (six) hours as needed for wheezing or shortness of breath. For COPD    . ALPRAZolam (XANAX) 0.5 MG tablet Take 0.5 mg by mouth 4 (four) times daily as needed for anxiety. For anxiety    . budesonide-formoterol  (SYMBICORT) 160-4.5 MCG/ACT inhaler Inhale 2 puffs into the lungs 2 (two) times daily.    . ferrous sulfate 325 (65 FE) MG tablet Take 325 mg by mouth daily with breakfast.    . folic acid (FOLVITE) 1 MG tablet Take 1 mg by mouth daily.    Marland Kitchen loratadine (CLARITIN) 10 MG tablet Take 10 mg by mouth daily.     . montelukast (SINGULAIR) 10 MG tablet Take 10 mg by mouth daily.     . pantoprazole (PROTONIX) 40 MG tablet Take 40 mg by mouth daily.    Marland Kitchen telmisartan-hydrochlorothiazide (MICARDIS HCT) 80-12.5 MG per tablet Take 1 tablet by mouth daily.     Marland Kitchen EPINEPHrine (EPIPEN 2-PAK) 0.3 mg/0.3 mL DEVI Inject 0.3 mg into the muscle once. Reported on 08/21/2015     No current facility-administered medications for this visit.    Allergies as of 08/21/2015 - Review Complete 08/21/2015  Allergen Reaction Noted  . Flexeril [cyclobenzaprine] Anaphylaxis 01/12/2012  . Other Anaphylaxis 02/08/2011  . Keflex [cephalexin]  05/26/2012  . Aspirin  05/05/2015  . Statins Other (See Comments) 05/17/2011  . Adhesive [tape] Rash 11/01/2014  . Chlorhexidine Rash 11/19/2014    Past Medical History  Diagnosis Date  . COPD (chronic obstructive pulmonary disease) ([place])   . Back pain, chronic   . Anxiety   . Hypertension   . GERD (gastroesophageal reflux disease)   . Shortness of breath     OCCASIONAL  . Arthritis     inflammatory arthritis  . Anemia NOV 2013  . Tubular  adenoma 11/2012  . Complication of anesthesia [place] DROPPED AND COULD NOT TAKE PAIN MEDS UNTIL VITALS IMPROVED    Past Surgical History  Procedure Laterality Date  . Hysterotomy    . Tubal ligation    . Abdominal hysterectomy    . Knee arthroscopy Right   . 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 Decompressive lumbar laminectomy level 2  04/20/2012    Procedure: DECOMPRESSIVE LUMBAR LAMINECTOMY  LEVEL 2;  Surgeon: Tobi Bastos, MD;  Location: [facility];  Service: Orthopedics;  Laterality: Right;  Decompressive Lumbar Laminectomy of the L4 - L5 and L5 - S1 Complete/Laminectomy L5 on the Right (X-Ray)  . Colonoscopy  06/19/2008    RMR: tortuous and elongated colon with scattered left-sided diverticula/colonic mucosa appeared entirely normal. Prior colonic ulcers had resolved.  . Esophagogastroduodenoscopy  12/2007    Dr. 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 Small hiatal hernia. Slightly pale duodenal mucosa (biopsy unremarkable)  . Colonoscopy  12/2007    Dr. Gala Romney: Scattered diffuse sigmoid diverticula, 2 areas of ulceration at the hepatic flexure. Biopsies unremarkable.  . Colonoscopy N/A 11/20/2012    next TCS 11/2017  . Back surgery    . Carpal tunnel release Right 4/14  . Reverse shoulder arthroplasty Right 05/24/2014    Procedure: RIGHT SHOULDER REVERSE ARTHROPLASTY;  Surgeon: Netta Cedars, MD;  Location: [place];  Service: Orthopedics;  Laterality: Right;  . Cataract extraction w/phaco Left 11/19/2014    Procedure: CATARACT EXTRACTION PHACO AND INTRAOCULAR LENS PLACEMENT (IOC);  Surgeon: Williams Che, MD;  Location: [facility];  Service: Ophthalmology;  Laterality: Left;  CDE: 4.77  . Cataract extraction w/phaco Right 02/03/2015    Procedure: CATARACT EXTRACTION PHACO AND INTRAOCULAR LENS PLACEMENT (IOC);  Surgeon: Williams Che, MD;  Location: [facility];  Service: Ophthalmology;  Laterality: Right;  CDE: 4.54    Family History  Problem Relation Age of Onset  . Breast cancer Sister   . Colon cancer Sister     two deceased, one living and terminal, one current undergoing treatment, ages 66, 65, 11, 42    Social History   Social History  . Marital Status: Single    Spouse Name: N/A  . Number of Children: 3  . Years of Education: N/A   Occupational History  . Not on file.   Social History Main Topics  . Smoking status:  Former Smoker -- 2.00 packs/day for 40 years    Types: Cigarettes    Quit date: 01/19/1992  . Smokeless tobacco: Never Used  . Alcohol Use: No  . Drug Use: No  . Sexual Activity: No   Other Topics Concern  . Not on file   Social History Narrative      ROS:  General: Negative for anorexia, weight loss, fever, chills, fatigue, weakness. Eyes: Negative for vision changes.  ENT: Negative for hoarseness, difficulty swallowing , nasal congestion. CV: Negative for chest pain, angina, palpitations, dyspnea on exertion, peripheral edema.  Respiratory: Negative for dyspnea at rest, dyspnea on exertion, cough, sputum, wheezing.  GI: See history of present illness. GU:  Negative for dysuria, hematuria, urinary incontinence, urinary frequency, nocturnal urination.  MS: Negative for joint pain. Positive low back pain.  Derm: Negative for rash or itching.  Neuro: Negative for weakness, abnormal sensation, seizure, frequent headaches,  memory loss, confusion.  Psych: Negative for anxiety, depression, suicidal ideation, hallucinations.  Endo: Negative for unusual weight change.  Heme: Negative for bruising or bleeding. Allergy: Negative for rash or hives.    Physical Examination:  BP 130/93 mmHg  Pulse 103  Temp(Src) 97.4 F (36.3 C)  Ht 5' (1.524 m)  Wt 197 lb (89.359 kg)  BMI 38.47 kg/m2   General: Well-nourished, well-developed in no acute distress.  Head: Normocephalic, atraumatic.   Eyes: Conjunctiva pink, no icterus. Mouth: Oropharyngeal mucosa moist and pink , no lesions erythema or exudate. Neck: Supple without thyromegaly, masses, or lymphadenopathy.  Lungs: Clear to auscultation bilaterally.  Heart: Regular rate and rhythm, no murmurs rubs or gallops.  Abdomen: Bowel sounds are normal, nontender, nondistended, no hepatosplenomegaly or masses, no abdominal bruits or    hernia , no rebound or guarding.   Rectal: Hemorrhoidal tags noted externally, no masses in the rectal  vault, no evidence of rectal prolapse, poor anal squeeze,? Anal canal hemorrhoids on exam. Brown stool heme-negative. Extremities: No lower extremity edema. No clubbing or deformities.  Neuro: Alert and oriented x 4 , grossly normal neurologically.  Skin: Warm and dry, no rash or jaundice.   Psych: Alert and cooperative, normal mood and affect.  Labs: Labs from April 2017 Lab Results  Component Value Date   WBC 6.8 06/21/2015   HGB 11.2* 06/21/2015   HCT 35.8* 06/21/2015   MCV 77.8* 06/21/2015   PLT 222 06/21/2015   Lab Results  Component Value Date   CREATININE 1.24* 06/21/2015   BUN 17 06/21/2015   NA 138 06/21/2015   K 3.8 06/21/2015   CL 105 06/21/2015   CO2 25 06/21/2015   Lab Results  Component Value Date   ALT 19 05/05/2015   AST 25 05/05/2015   ALKPHOS 87 05/05/2015   BILITOT 0.5 05/05/2015      Imaging Studies:   CLINICAL DATA: Awoke with abdominal pain 12 hours prior. Pain in the supraumbilical area.  EXAM: CT ABDOMEN AND PELVIS WITH CONTRAST  TECHNIQUE: Multidetector CT imaging of the abdomen and pelvis was performed using the standard protocol following bolus administration of intravenous contrast.  CONTRAST: 153mL OMNIPAQUE IOHEXOL 300 MG/ML SOLN  COMPARISON: CT 03/02/2014. Additional prior CTs, dating back to 05/2011  FINDINGS: Lower chest: Unchanged lingular atelectasis or scarring. Tortuosity of the distal descending thoracic aorta. No pleural effusion.  Liver: Hypodense lesion in the right hepatic dome measuring 1.2 x 1.6 cm is not significantly changed compared to prior exam allowing for differences in technique and measurement placement. No new focal hepatic lesion. Small calcified densities in the porta hepatis, may be sequela of prior granulomatous disease.  Hepatobiliary: Gallbladder physiologically distended, no calcified stone. No biliary dilatation.  Pancreas: No ductal dilatation or inflammation.  Spleen:  Normal.  Adrenal glands: No nodule.  Kidneys: Symmetric renal enhancement. No hydronephrosis. No perinephric stranding or focal renal abnormality.  Stomach/Bowel: Stomach physiologically distended. Small hiatal hernia. There are no dilated or thickened small bowel loops. Mild colonic diverticulosis in the distal colon without acute diverticulitis. Moderate stool in the right colon, with small stool burden distally. The appendix is normal.  Vascular/Lymphatic: No retroperitoneal adenopathy. Abdominal aorta is normal in caliber. Moderate atherosclerosis without aneurysm.  Reproductive: The uterus is surgically absent. No adnexal mass.  Bladder: Minimally distended, mild displacement of the right, unchanged from prior exam.  Other: No free air, free fluid, or intra-abdominal fluid collection.  Musculoskeletal: There are no acute or suspicious osseous abnormalities.  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  IMPRESSION: 1. No acute abnormality in the abdomen/pelvis. 2. Mild distal colonic diverticulosis without diverticulitis.   Electronically Signed  By: Jeb Levering M.D.  On: 05/05/2015 18:58

## 2015-08-21 NOTE — Patient Instructions (Addendum)
1. Chew two fiber choice tablets daily to help move stool more effectively and help keep stool more formed.  2. I have sent in RX for hemorrhoid cream but it may not be covered by your insurance. If it is not, then continue OTC hemorrhoid creams or suppositories at least twice per day. 3. Call with a progress report in 2 weeks.

## 2015-08-22 NOTE — Progress Notes (Signed)
cc'ed to pcp °

## 2015-08-22 NOTE — Assessment & Plan Note (Signed)
75 year old female with extensive family history of colon cancer, patient's last colonoscopy 2014 with single tubular adenoma. She presents with bowel complaints, incomplete stool evacuation, difficulty to clean after BM likely due to hemorrhoidal disease. Patient having significant back issues at this time and is not interested in hemorrhoid banding. She has stable microcytic anemia. She worries about colon cancer with her significant family history. Her last colonoscopy is not quite 75 years old.  We discussed options including symptomatic treatment versus [facility] banding versus desire for further colonoscopy. Plan for topical treatment prescription strength hemorrhoid cream. Add fiber choice, chew 2 daily to hopefully to facilitate bowel evacuation. Patient to call with progress report in 2 weeks.

## 2015-09-04 DIAGNOSIS — M5442 Lumbago with sciatica, left side: Secondary | ICD-10-CM | POA: Diagnosis not present

## 2015-09-20 ENCOUNTER — Encounter (HOSPITAL_COMMUNITY): Payer: Self-pay | Admitting: *Deleted

## 2015-09-20 ENCOUNTER — Emergency Department (HOSPITAL_COMMUNITY): Payer: PPO

## 2015-09-20 ENCOUNTER — Inpatient Hospital Stay (HOSPITAL_COMMUNITY)
Admission: EM | Admit: 2015-09-20 | Discharge: 2015-09-25 | DRG: 190 | Disposition: A | Discharge status: Home Health | Payer: PPO | Attending: Internal Medicine | Admitting: Internal Medicine

## 2015-09-20 DIAGNOSIS — I130 Hypertensive heart and chronic kidney disease with heart failure and stage 1 through stage 4 chronic kidney disease, or unspecified chronic kidney disease: Secondary | ICD-10-CM | POA: Diagnosis not present

## 2015-09-20 DIAGNOSIS — J45901 Unspecified asthma with (acute) exacerbation: Secondary | ICD-10-CM | POA: Diagnosis present

## 2015-09-20 DIAGNOSIS — F419 Anxiety disorder, unspecified: Secondary | ICD-10-CM | POA: Diagnosis present

## 2015-09-20 DIAGNOSIS — R05 Cough: Secondary | ICD-10-CM | POA: Diagnosis not present

## 2015-09-20 DIAGNOSIS — I129 Hypertensive chronic kidney disease with stage 1 through stage 4 chronic kidney disease, or unspecified chronic kidney disease: Secondary | ICD-10-CM | POA: Diagnosis not present

## 2015-09-20 DIAGNOSIS — Z96651 Presence of right artificial knee joint: Secondary | ICD-10-CM | POA: Diagnosis present

## 2015-09-20 DIAGNOSIS — R0600 Dyspnea, unspecified: Secondary | ICD-10-CM | POA: Diagnosis not present

## 2015-09-20 DIAGNOSIS — I5033 Acute on chronic diastolic (congestive) heart failure: Secondary | ICD-10-CM | POA: Diagnosis not present

## 2015-09-20 DIAGNOSIS — J441 Chronic obstructive pulmonary disease with (acute) exacerbation: Principal | ICD-10-CM | POA: Diagnosis present

## 2015-09-20 DIAGNOSIS — K219 Gastro-esophageal reflux disease without esophagitis: Secondary | ICD-10-CM | POA: Diagnosis present

## 2015-09-20 DIAGNOSIS — R0602 Shortness of breath: Secondary | ICD-10-CM

## 2015-09-20 DIAGNOSIS — Z7951 Long term (current) use of inhaled steroids: Secondary | ICD-10-CM

## 2015-09-20 DIAGNOSIS — Z87891 Personal history of nicotine dependence: Secondary | ICD-10-CM

## 2015-09-20 DIAGNOSIS — J9600 Acute respiratory failure, unspecified whether with hypoxia or hypercapnia: Secondary | ICD-10-CM | POA: Diagnosis not present

## 2015-09-20 DIAGNOSIS — I10 Essential (primary) hypertension: Secondary | ICD-10-CM | POA: Diagnosis not present

## 2015-09-20 DIAGNOSIS — Z803 Family history of malignant neoplasm of breast: Secondary | ICD-10-CM | POA: Diagnosis not present

## 2015-09-20 DIAGNOSIS — N183 Chronic kidney disease, stage 3 unspecified: Secondary | ICD-10-CM | POA: Diagnosis present

## 2015-09-20 DIAGNOSIS — R918 Other nonspecific abnormal finding of lung field: Secondary | ICD-10-CM | POA: Diagnosis not present

## 2015-09-20 DIAGNOSIS — Z96611 Presence of right artificial shoulder joint: Secondary | ICD-10-CM | POA: Diagnosis present

## 2015-09-20 DIAGNOSIS — Z800 Family history of malignant neoplasm of digestive organs: Secondary | ICD-10-CM

## 2015-09-20 DIAGNOSIS — J9601 Acute respiratory failure with hypoxia: Secondary | ICD-10-CM | POA: Diagnosis present

## 2015-09-20 LAB — CBC WITH DIFFERENTIAL/PLATELET
BASOS PCT: 0 %
Basophils Absolute: 0 10*3/uL (ref 0.0–0.1)
Eosinophils Absolute: 0.4 10*3/uL (ref 0.0–0.7)
Eosinophils Relative: 4 %
HEMATOCRIT: 37.8 % (ref 36.0–46.0)
HEMOGLOBIN: 11.9 g/dL — AB (ref 12.0–15.0)
LYMPHS ABS: 5.5 10*3/uL — AB (ref 0.7–4.0)
Lymphocytes Relative: 53 %
MCH: 24.7 pg — AB (ref 26.0–34.0)
MCHC: 31.5 g/dL (ref 30.0–36.0)
MCV: 78.6 fL (ref 78.0–100.0)
MONOS PCT: 6 %
Monocytes Absolute: 0.7 10*3/uL (ref 0.1–1.0)
NEUTROS ABS: 4 10*3/uL (ref 1.7–7.7)
NEUTROS PCT: 37 %
Platelets: 245 10*3/uL (ref 150–400)
RBC: 4.81 MIL/uL (ref 3.87–5.11)
RDW: 14.7 % (ref 11.5–15.5)
WBC: 10.6 10*3/uL — AB (ref 4.0–10.5)

## 2015-09-20 LAB — BLOOD GAS, ARTERIAL
Acid-Base Excess: 0.8 mmol/L (ref 0.0–2.0)
Bicarbonate: 24.9 mEq/L — ABNORMAL HIGH (ref 20.0–24.0)
DRAWN BY: 105551
Delivery systems: POSITIVE
Expiratory PAP: 6
FIO2: 100
Inspiratory PAP: 16
O2 Saturation: 99.7 %
RATE: 10 resp/min
pCO2 arterial: 44.6 mmHg (ref 35.0–45.0)
pH, Arterial: 7.375 (ref 7.350–7.450)
pO2, Arterial: 446 mmHg — ABNORMAL HIGH (ref 80.0–100.0)

## 2015-09-20 LAB — COMPREHENSIVE METABOLIC PANEL
ALBUMIN: 4.3 g/dL (ref 3.5–5.0)
ALK PHOS: 82 U/L (ref 38–126)
ALT: 26 U/L (ref 14–54)
ANION GAP: 10 (ref 5–15)
AST: 31 U/L (ref 15–41)
BILIRUBIN TOTAL: 0.4 mg/dL (ref 0.3–1.2)
BUN: 13 mg/dL (ref 6–20)
CHLORIDE: 104 mmol/L (ref 101–111)
CO2: 24 mmol/L (ref 22–32)
Calcium: 9 mg/dL (ref 8.9–10.3)
Creatinine, Ser: 1.25 mg/dL — ABNORMAL HIGH (ref 0.44–1.00)
GFR calc non Af Amer: 42 mL/min — ABNORMAL LOW (ref >60)
GFR, EST AFRICAN AMERICAN: 49 mL/min — AB (ref >60)
GLUCOSE: 120 mg/dL — AB (ref 65–99)
Potassium: 4 mmol/L (ref 3.5–5.1)
SODIUM: 138 mmol/L (ref 135–145)
Total Protein: 7.5 g/dL (ref 6.5–8.1)

## 2015-09-20 LAB — I-STAT TROPONIN, ED: Troponin i, poc: 0 ng/mL (ref 0.00–0.08)

## 2015-09-20 MED ORDER — IPRATROPIUM-ALBUTEROL 0.5-2.5 (3) MG/3ML IN SOLN
3.0000 mL | Freq: Once | RESPIRATORY_TRACT | Status: AC
  Administered 2015-09-20: 3 mL via RESPIRATORY_TRACT
  Filled 2015-09-20: qty 3

## 2015-09-20 MED ORDER — ACETAMINOPHEN 325 MG PO TABS: 650.0000 mg | ORAL_TABLET | Freq: Four times a day (QID) | ORAL | Status: DC | PRN

## 2015-09-20 MED ORDER — MONTELUKAST SODIUM 10 MG PO TABS
10.0000 mg | ORAL_TABLET | Freq: Every day | ORAL | Status: DC
  Administered 2015-09-21 – 2015-09-24 (×5): 10 mg via ORAL
  Filled 2015-09-20 (×5): qty 1

## 2015-09-20 MED ORDER — ALBUTEROL SULFATE (2.5 MG/3ML) 0.083% IN NEBU
INHALATION_SOLUTION | RESPIRATORY_TRACT | Status: AC
  Administered 2015-09-20: 5 mg
  Filled 2015-09-20: qty 6

## 2015-09-20 MED ORDER — LORAZEPAM 2 MG/ML IJ SOLN
0.5000 mg | Freq: Once | INTRAMUSCULAR | Status: AC
  Administered 2015-09-20: 0.5 mg via INTRAVENOUS
  Filled 2015-09-20: qty 1

## 2015-09-20 MED ORDER — FERROUS SULFATE 325 (65 FE) MG PO TABS
325.0000 mg | ORAL_TABLET | Freq: Every day | ORAL | Status: DC
  Administered 2015-09-21 – 2015-09-25 (×5): 325 mg via ORAL
  Filled 2015-09-20 (×5): qty 1

## 2015-09-20 MED ORDER — PANTOPRAZOLE SODIUM 40 MG PO TBEC
40.0000 mg | DELAYED_RELEASE_TABLET | Freq: Every day | ORAL | Status: DC
  Administered 2015-09-21 – 2015-09-24 (×5): 40 mg via ORAL
  Filled 2015-09-20 (×5): qty 1

## 2015-09-20 MED ORDER — ONDANSETRON HCL 4 MG PO TABS: 4.0000 mg | ORAL_TABLET | Freq: Four times a day (QID) | ORAL | Status: DC | PRN

## 2015-09-20 MED ORDER — ALPRAZOLAM 0.5 MG PO TABS
0.5000 mg | ORAL_TABLET | Freq: Four times a day (QID) | ORAL | Status: DC | PRN
  Administered 2015-09-21 – 2015-09-25 (×14): 0.5 mg via ORAL
  Filled 2015-09-20 (×14): qty 1

## 2015-09-20 MED ORDER — MAGNESIUM SULFATE 2 GM/50ML IV SOLN
2.0000 g | Freq: Once | INTRAVENOUS | Status: AC
  Administered 2015-09-20: 2 g via INTRAVENOUS
  Filled 2015-09-20: qty 50

## 2015-09-20 MED ORDER — ONDANSETRON HCL 4 MG/2ML IJ SOLN: 4.0000 mg | Freq: Four times a day (QID) | INTRAMUSCULAR | Status: DC | PRN

## 2015-09-20 MED ORDER — SODIUM CHLORIDE 0.9 % IV BOLUS (SEPSIS)
1000.0000 mL | Freq: Once | INTRAVENOUS | Status: AC
  Administered 2015-09-20: 1000 mL via INTRAVENOUS

## 2015-09-20 MED ORDER — LACTATED RINGERS IV SOLN
INTRAVENOUS | Status: DC
  Administered 2015-09-21: via INTRAVENOUS

## 2015-09-20 MED ORDER — TELMISARTAN-HCTZ 80-12.5 MG PO TABS: 1.0000 | ORAL_TABLET | Freq: Every day | ORAL | Status: DC

## 2015-09-20 MED ORDER — ALBUTEROL (5 MG/ML) CONTINUOUS INHALATION SOLN: 10.0000 mg/h | INHALATION_SOLUTION | RESPIRATORY_TRACT | Status: DC

## 2015-09-20 MED ORDER — ALBUTEROL SULFATE (2.5 MG/3ML) 0.083% IN NEBU: 2.5000 mg | INHALATION_SOLUTION | RESPIRATORY_TRACT | Status: DC | PRN

## 2015-09-20 MED ORDER — METHYLPREDNISOLONE SODIUM SUCC 125 MG IJ SOLR
60.0000 mg | Freq: Two times a day (BID) | INTRAMUSCULAR | Status: DC
  Administered 2015-09-21 (×2): 60 mg via INTRAVENOUS
  Filled 2015-09-20 (×2): qty 2

## 2015-09-20 MED ORDER — FOLIC ACID 1 MG PO TABS
1.0000 mg | ORAL_TABLET | Freq: Every day | ORAL | Status: DC
  Administered 2015-09-21 – 2015-09-24 (×4): 1 mg via ORAL
  Filled 2015-09-20 (×6): qty 1

## 2015-09-20 MED ORDER — IPRATROPIUM BROMIDE 0.02 % IN SOLN
RESPIRATORY_TRACT | Status: AC
  Filled 2015-09-20: qty 2.5

## 2015-09-20 MED ORDER — DOXYCYCLINE HYCLATE 100 MG PO TABS
100.0000 mg | ORAL_TABLET | Freq: Two times a day (BID) | ORAL | Status: DC
  Administered 2015-09-21 – 2015-09-25 (×10): 100 mg via ORAL
  Filled 2015-09-20 (×10): qty 1

## 2015-09-20 MED ORDER — SENNA 8.6 MG PO TABS
1.0000 | ORAL_TABLET | Freq: Two times a day (BID) | ORAL | Status: DC
  Administered 2015-09-21 – 2015-09-24 (×9): 8.6 mg via ORAL
  Filled 2015-09-20 (×9): qty 1

## 2015-09-20 MED ORDER — MORPHINE SULFATE (PF) 2 MG/ML IV SOLN
2.0000 mg | INTRAVENOUS | Status: DC | PRN
  Administered 2015-09-21: 2 mg via INTRAVENOUS
  Filled 2015-09-20: qty 1

## 2015-09-20 MED ORDER — SODIUM CHLORIDE 0.9 % IV BOLUS (SEPSIS): 1000.0000 mL | Freq: Once | INTRAVENOUS | Status: DC

## 2015-09-20 MED ORDER — METHYLPREDNISOLONE SODIUM SUCC 125 MG IJ SOLR
125.0000 mg | Freq: Once | INTRAMUSCULAR | Status: AC
  Administered 2015-09-20: 125 mg via INTRAVENOUS
  Filled 2015-09-20: qty 2

## 2015-09-20 MED ORDER — IPRATROPIUM-ALBUTEROL 0.5-2.5 (3) MG/3ML IN SOLN
3.0000 mL | Freq: Four times a day (QID) | RESPIRATORY_TRACT | Status: DC
  Administered 2015-09-21 – 2015-09-25 (×14): 3 mL via RESPIRATORY_TRACT
  Filled 2015-09-20 (×15): qty 3

## 2015-09-20 MED ORDER — IPRATROPIUM BROMIDE 0.02 % IN SOLN
0.5000 mg | Freq: Once | RESPIRATORY_TRACT | Status: AC
Start: 2015-09-20 — End: 2015-09-20
  Administered 2015-09-20: 0.5 mg via RESPIRATORY_TRACT

## 2015-09-20 MED ORDER — ENOXAPARIN SODIUM 40 MG/0.4ML ~~LOC~~ SOLN
40.0000 mg | SUBCUTANEOUS | Status: DC
  Administered 2015-09-21 – 2015-09-24 (×5): 40 mg via SUBCUTANEOUS
  Filled 2015-09-20 (×5): qty 0.4

## 2015-09-20 MED ORDER — ACETAMINOPHEN 650 MG RE SUPP
650.0000 mg | Freq: Four times a day (QID) | RECTAL | Status: DC | PRN
Start: 2015-09-20 — End: 2015-09-25

## 2015-09-20 MED ORDER — DOCUSATE SODIUM 100 MG PO CAPS
100.0000 mg | ORAL_CAPSULE | Freq: Two times a day (BID) | ORAL | Status: DC
  Administered 2015-09-21 – 2015-09-25 (×10): 100 mg via ORAL
  Filled 2015-09-20 (×10): qty 1

## 2015-09-20 MED ORDER — LORATADINE 10 MG PO TABS
10.0000 mg | ORAL_TABLET | Freq: Every day | ORAL | Status: DC
  Administered 2015-09-21 – 2015-09-25 (×5): 10 mg via ORAL
  Filled 2015-09-20 (×5): qty 1

## 2015-09-20 MED ORDER — ALBUTEROL (5 MG/ML) CONTINUOUS INHALATION SOLN
10.0000 mg/h | INHALATION_SOLUTION | RESPIRATORY_TRACT | Status: DC
  Administered 2015-09-20 (×2): 10 mg/h via RESPIRATORY_TRACT
  Filled 2015-09-20: qty 20

## 2015-09-20 NOTE — H&P (Signed)
History and Physical   Glenda Terry  [id number]  DOB: July 04, 1940  DOA: 09/20/2015  PCP: Wende Neighbors, MD   Outpatient Specialists: Laurance Flatten - Pulmonology; Lorin Mercy - orthpedics; Ronnald Ramp - dermatologist   Requesting physician: [place], ER  Reason for consultation: SOB   History of Present Illness: Glenda Terry is an 75 y.o. female with h/o asthma presenting with progressive SOB.  Feeling bad.  Doctor in [place] told her to walk inside for 15 minutes per day - no treadmill or exercise bike.  Feeling so good that she has been pushing her limits - going to walk outside, [place] [facility]  Started feeling bad on Thursday, dizzy and congested.  Goal peak flow is 250.  On Thursday could only get to 200.  Using rescue inhaler, taking Xanax.  Kept feeling tight.  Still congested Friday then feeling tight all over again.  Started feeling worse this evening and decided to come to ER.  1 episode of emesis tonight in ER - thinks it was indigestion from medication after having just eaten.  No fever.  No sick contacts.  +rhinorrhea, chest tightness/congestion.  +wheezing.  Home nebs not helping.  +productive cough - sputum is similar in purulence to baseline but increased in volume.     Review of Systems:  ROS As per HPI otherwise 10 point review of systems reviewed and are negative.     Past Medical History: Past Medical History  Diagnosis Date  . COPD (chronic obstructive pulmonary disease) ([place])   . Back pain, chronic   . Anxiety   . Hypertension   . GERD (gastroesophageal reflux disease)   . Arthritis     inflammatory arthritis  . Anemia NOV 2013  . Tubular adenoma 11/2012  . Complication of anesthesia 1985    VITALS SIGNS DROPPED AND COULD NOT TAKE PAIN MEDS UNTIL VITALS IMPROVED  . Asthma 1991    not COPD per [facility] Pulmonology    Past Surgical History: Past Surgical History  Procedure Laterality Date  . Tubal ligation    . Abdominal hysterectomy      . Knee arthroscopy Right   . 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 Decompressive lumbar laminectomy level 2  04/20/2012    Procedure: DECOMPRESSIVE LUMBAR LAMINECTOMY LEVEL 2;  Surgeon: Tobi Bastos, MD;  [facility];  Service: Orthopedics;  Laterality: Right;  Decompressive Lumbar Laminectomy of the L4 - L5 and L5 - S1 Complete/Laminectomy L5 on the Right (X-Ray)  . Colonoscopy  06/19/2008    RMR: tortuous and elongated colon with scattered left-sided diverticula/colonic mucosa appeared entirely normal. Prior colonic ulcers had resolved.  . Esophagogastroduodenoscopy  12/2007    Dr. 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 Small hiatal hernia. Slightly pale duodenal mucosa (biopsy unremarkable)  . Colonoscopy  12/2007    Dr. Gala Romney: Scattered diffuse sigmoid diverticula, 2 areas of ulceration at the hepatic flexure. Biopsies unremarkable.  . Colonoscopy N/A 11/20/2012    next TCS 11/2017  . Back surgery    . Carpal tunnel release Right 4/14  . Reverse shoulder arthroplasty Right 05/24/2014    Procedure: RIGHT SHOULDER REVERSE ARTHROPLASTY;  Surgeon: Netta Cedars, MD;  Location: [place];  Service: Orthopedics;  Laterality: Right;  . Cataract extraction w/phaco Left 11/19/2014  Procedure: CATARACT EXTRACTION PHACO AND INTRAOCULAR LENS PLACEMENT (IOC);  Surgeon: Williams Che, MD;  Location: [facility];  Service: Ophthalmology;  Laterality: Left;  CDE: 4.77  . Cataract extraction w/phaco Right 02/03/2015    Procedure: CATARACT EXTRACTION PHACO AND INTRAOCULAR LENS PLACEMENT (IOC);  Surgeon: Williams Che, MD;  [facility];  Service: Ophthalmology;  Laterality: Right;  CDE: 4.54     Allergies:   Allergies  Allergen Reactions  . Flexeril [Cyclobenzaprine] Anaphylaxis  . Other Anaphylaxis    ALL TREE  NUTS  . Keflex [Cephalexin]     ITCH  . Aspirin     Cannot take due to ulcers  . Statins Other (See Comments)    is pain and swelling   . Adhesive [Tape] Rash  . Chlorhexidine Rash     Social History:  reports that she quit smoking about 23 years ago. Her smoking use included Cigarettes. She has a 80 pack-year smoking history. She has never used smokeless tobacco. She reports that she does not drink alcohol or use illicit drugs.   Family History: Family History  Problem Relation Age of Onset  . Breast cancer Sister   . Colon cancer Sister     two deceased, one living and terminal, one current undergoing treatment, ages 65, 42, 29, 92     Physical Exam: Filed Vitals:   09/20/15 2200 09/20/15 2204 09/20/15 2214 09/20/15 2223  BP:    117/55  Pulse: 98   95  Temp:      TempSrc:      Resp: 14   22  SpO2: 99% 98% 100% 100%    Constitutional: Alert and awake, oriented x3, not in any acute distress. Eyes: PERLA, EOMI, irises appear normal, anicteric sclera,  ENMT: external ears and nose appear normal, normal hearing            Lips appears normal, oropharynx mucosa, tongue, posterior pharynx appear normal  Neck: neck appears normal, no masses, normal ROM, no thyromegaly, no JVD  CVS: S1-S2 clear, no murmur rubs or gallops, no LE edema, normal pedal pulses  Respiratory:  Still very tight, increased WOB, not moving air well, diffuse wheezes Abdomen: soft nontender, nondistended, normal bowel sounds, no hepatosplenomegaly, no hernias  Musculoskeletal: : no cyanosis, clubbing or edema noted bilaterally Neuro: Cranial nerves II-XII intact, strength, sensation, reflexes Psych: judgement and insight appear normal, stable mood and affect, mental status Skin: no rashes or lesions or ulcers, no induration or nodules   Data reviewed:  I have personally reviewed following labs and imaging studies Labs:  CBC:  Recent Labs Lab 09/20/15 1900  WBC 10.6*  NEUTROABS 4.0  HGB 11.9*   HCT 37.8  MCV 78.6  PLT 99991111    Basic Metabolic Panel:  Recent Labs Lab 09/20/15 1900  NA 138  K 4.0  CL 104  CO2 24  GLUCOSE 120*  BUN 13  CREATININE 1.25*  CALCIUM 9.0   GFR CrCl cannot be calculated (Unknown ideal weight.). Liver Function Tests:  Recent Labs Lab 09/20/15 1900  AST 31  ALT 26  ALKPHOS 82  BILITOT 0.4  PROT 7.5  ALBUMIN 4.3   No results for input(s): LIPASE, AMYLASE in the last 168 hours. No results for input(s): AMMONIA in the last 168 hours. Coagulation profile No results for input(s): INR, PROTIME in the last 168 hours.  Cardiac Enzymes: No results for input(s): CKTOTAL, CKMB, CKMBINDEX, TROPONINI in the last 168 hours. BNP: Invalid input(s): POCBNP CBG: No results  for input(s): GLUCAP in the last 168 hours. D-Dimer No results for input(s): DDIMER in the last 72 hours. Hgb A1c No results for input(s): HGBA1C in the last 72 hours. Lipid Profile No results for input(s): CHOL, HDL, LDLCALC, TRIG, CHOLHDL, LDLDIRECT in the last 72 hours. Thyroid function studies No results for input(s): TSH, T4TOTAL, T3FREE, THYROIDAB in the last 72 hours.  Invalid input(s): FREET3 Anemia work up No results for input(s): VITAMINB12, FOLATE, FERRITIN, TIBC, IRON, RETICCTPCT in the last 72 hours. Urinalysis    Component Value Date/Time   COLORURINE YELLOW 05/05/2015 1650   APPEARANCEUR CLEAR 05/05/2015 1650   LABSPEC 1.015 05/05/2015 1650   PHURINE 6.0 05/05/2015 1650   GLUCOSEU NEGATIVE 05/05/2015 1650   HGBUR NEGATIVE 05/05/2015 1650   BILIRUBINUR NEGATIVE 05/05/2015 1650   KETONESUR NEGATIVE 05/05/2015 1650   PROTEINUR NEGATIVE 05/05/2015 1650   UROBILINOGEN 0.2 03/03/2014 1324   NITRITE NEGATIVE 05/05/2015 1650   LEUKOCYTESUR NEGATIVE 05/05/2015 1650     Microbiology No results found for this or any previous visit (from the past 240 hour(s)).     Inpatient Medications:   Scheduled Meds:   Continuous Infusions: . albuterol 10 mg/hr  (09/20/15 2213)  . albuterol    . sodium chloride       Radiological Exams on Admission: Dg Chest Portable 1 View  09/20/2015  CLINICAL DATA:  Shortness of breath, difficulty breathing, asthma attack, cough EXAM: PORTABLE CHEST 1 VIEW COMPARISON:  06/21/2015 FINDINGS: Lungs are clear.  No pleural effusion or pneumothorax. The heart is normal in size. Right shoulder arthroplasty. Degenerative changes of the left shoulder. IMPRESSION: No evidence of acute cardiopulmonary disease. Electronically Signed   By: Julian Hy M.D.   On: 09/20/2015 19:20   EKG: Independently reviewed.  Sinus tachycardia with rate 136; PVC; nonspecific ST changes with no evidence of acute ischemia   Assessment/Plan Principal Problem:   Asthma exacerbation Active Problems:   Anxiety   HTN (hypertension)   GERD (gastroesophageal reflux disease)   Acute respiratory failure -Chart lists a h/o COPD and patient with an extensive smoking history, but patient reports that her "asthma doctor" at [facility] assures her this is asthma -Regardless of underlying diagnosis, patient presented in acute respiratory failure requiring continuous neb and bipap -Gas on bipap looked good and patient has been able to come off bipap but continues with significant wheezing and bronchospasm; will give an additional continuous neb now -Received magnesium (and steroids?) in ER -Will place in observation status on Med-Surg -Nebulizers: scheduled Duoneb and prn albuterol -Solu-Medrol 60 mg IV BID -Oral doxycycline - if this is COPD-exacerbation, patient endorses SOB and change in sputum volume and so antibiotics would be warranted; patient also does have mild leukocytosis -Mucinex for cough  -If bronchospasm breaks quickly, patient may be ready for discharge on 7/9 or 7/10  Anxiety -Continue Xanax  -I have reviewed this patient in the  Controlled Substances Reporting System.  She is receiving her medications from only one provider and  appears to be taking them as prescribed.  HTN -Continue Micardis-HCT. -Patient with borderline low BP while in ER and was given a fluid bolus.   -If ongoing low BPs overnight, will hold Micardis-HCT in AM.  CKD Stage 3 -Not previously documented as diagnosis, but creatinine today is essentially the same as in April 2017 and so this is presumed to be chronic -Patient does have longstanding HTN and so CKD would not be unexpected -Does not need renal adjustment of medications at this  time.  GERD -Continue home Protonix   DVT prophylaxis:  Lovenox  Code Status: Full - confirmed with patient/family Family Communication: Son and daughter were present throughout my evaluation Disposition Plan: Home once clinically improved Consults called: None Admission status: Observation   Karmen Bongo MD [facility]  If 7PM-7AM, please contact night-coverage [email or web address] Password [facility]    09/20/2015, 10:55 PM

## 2015-09-20 NOTE — ED Notes (Signed)
MD at bedside. 

## 2015-09-20 NOTE — ED Provider Notes (Signed)
CSN: [id number]     Arrival date & time 09/20/15  1845 History   First MD Initiated Contact with Patient 09/20/15 1855     Chief Complaint  Patient presents with  . Shortness of Breath     (Consider location/radiation/quality/duration/timing/severity/associated sxs/prior Treatment) Patient is a 75 y.o. female presenting with shortness of breath.  Shortness of Breath Severity:  Severe Onset quality:  Gradual Timing:  Constant Chronicity:  New Context: not activity and not known allergens   Relieved by:  None tried Ineffective treatments:  Diuretics and inhaler Associated symptoms: chest pain, cough and sputum production   Associated symptoms: no abdominal pain, no fever, no rash and no sore throat     Past Medical History  Diagnosis Date  . COPD (chronic obstructive pulmonary disease) ([place])   . Back pain, chronic   . Anxiety   . Hypertension   . GERD (gastroesophageal reflux disease)   . Shortness of breath     OCCASIONAL  . Arthritis     inflammatory arthritis  . Anemia NOV 2013  . Tubular adenoma 11/2012  . Complication of anesthesia [place] DROPPED AND COULD NOT TAKE PAIN MEDS UNTIL VITALS IMPROVED   Past Surgical History  Procedure Laterality Date  . Hysterotomy    . Tubal ligation    . Abdominal hysterectomy    . Knee arthroscopy Right   . 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 Decompressive lumbar laminectomy level 2  04/20/2012    Procedure: DECOMPRESSIVE LUMBAR LAMINECTOMY LEVEL 2;  Surgeon: Tobi Bastos, MD;  [facility]: [facility];  Service: Orthopedics;  Laterality: Right;  Decompressive Lumbar Laminectomy of the L4 - L5 and L5 - S1 Complete/Laminectomy L5 on the Right (X-Ray)  . Colonoscopy  06/19/2008    RMR: tortuous and elongated colon with scattered left-sided diverticula/colonic mucosa appeared entirely normal.  Prior colonic ulcers had resolved.  . Esophagogastroduodenoscopy  12/2007    Dr. 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 Small hiatal hernia. Slightly pale duodenal mucosa (biopsy unremarkable)  . Colonoscopy  12/2007    Dr. Gala Romney: Scattered diffuse sigmoid diverticula, 2 areas of ulceration at the hepatic flexure. Biopsies unremarkable.  . Colonoscopy N/A 11/20/2012    next TCS 11/2017  . Back surgery    . Carpal tunnel release Right 4/14  . Reverse shoulder arthroplasty Right 05/24/2014    Procedure: RIGHT SHOULDER REVERSE ARTHROPLASTY;  Surgeon: Netta Cedars, MD;  Location: [facility];  Service: Orthopedics;  Laterality: Right;  . Cataract extraction w/phaco Left 11/19/2014    Procedure: CATARACT EXTRACTION PHACO AND INTRAOCULAR LENS PLACEMENT (IOC);  Surgeon: Williams Che, MD;  Location: [facility];  Service: Ophthalmology;  Laterality: Left;  CDE: 4.77  . Cataract extraction w/phaco Right 02/03/2015    Procedure: CATARACT EXTRACTION PHACO AND INTRAOCULAR LENS PLACEMENT (IOC);  Surgeon: Williams Che, MD;  Location: [facility];  Service: Ophthalmology;  Laterality: Right;  CDE: 4.54   Family History  Problem Relation Age of Onset  . Breast cancer Sister   . Colon cancer Sister     two deceased, one living and terminal, one current undergoing treatment, ages 58, 67, 71, 76   Social History  Substance Use Topics  . Smoking status: Former Smoker -- 2.00 packs/day for 40 years    Types: Cigarettes  Quit date: 01/19/1992  . Smokeless tobacco: Never Used  . Alcohol Use: No   OB History    Gravida Para Term Preterm AB TAB SAB Ectopic Multiple Living   3 3 2 1      3      Review of Systems  Constitutional: Negative for fever and chills.  HENT: Positive for congestion. Negative for sore throat.   Eyes: Negative for pain.  Respiratory: Positive for cough, sputum production and shortness of breath.   Cardiovascular: Positive for chest pain.   Gastrointestinal: Negative for abdominal pain.  Endocrine: Negative for polydipsia and polyuria.  Genitourinary: Negative for dysuria.  Skin: Negative for rash.  All other systems reviewed and are negative.     Allergies  Flexeril; Other; Keflex; Aspirin; Statins; Adhesive; and Chlorhexidine  Home Medications   Prior to Admission medications   Medication Sig Start Date End Date Taking? Authorizing Provider  albuterol (ACCUNEB) 0.63 MG/3ML nebulizer solution Take 1 ampule by nebulization every 6 (six) hours as needed for wheezing or shortness of breath.  04/10/15   Historical Provider, MD  albuterol (PROAIR HFA) 108 (90 BASE) MCG/ACT inhaler Inhale 2 puffs into the lungs every 6 (six) hours as needed for wheezing or shortness of breath. For COPD    Historical Provider, MD  ALPRAZolam Duanne Moron) 0.5 MG tablet Take 0.5 mg by mouth 4 (four) times daily as needed for anxiety. For anxiety    Historical Provider, MD  budesonide-formoterol (SYMBICORT) 160-4.5 MCG/ACT inhaler Inhale 2 puffs into the lungs 2 (two) times daily.    Historical Provider, MD  EPINEPHrine (EPIPEN 2-PAK) 0.3 mg/0.3 mL DEVI Inject 0.3 mg into the muscle once. Reported on 08/21/2015    Historical Provider, MD  ferrous sulfate 325 (65 FE) MG tablet Take 325 mg by mouth daily with breakfast.    Historical Provider, MD  folic acid (FOLVITE) 1 MG tablet Take 1 mg by mouth daily.    Historical Provider, MD  hydrocortisone (PROCTOZONE-HC) 2.5 % rectal cream Place 1 application rectally 2 (two) times daily. 08/21/15   Mahala Menghini, PA-C  loratadine (CLARITIN) 10 MG tablet Take 10 mg by mouth daily.     Historical Provider, MD  montelukast (SINGULAIR) 10 MG tablet Take 10 mg by mouth daily.  04/12/15   Historical Provider, MD  pantoprazole (PROTONIX) 40 MG tablet Take 40 mg by mouth daily.    Historical Provider, MD  telmisartan-hydrochlorothiazide (MICARDIS HCT) 80-12.5 MG per tablet Take 1 tablet by mouth daily.  10/09/12   Historical  Provider, MD   Pulse 144  Resp 25  SpO2 99% Physical Exam  Constitutional: She is oriented to person, place, and time. She appears well-developed and well-nourished.  HENT:  Head: Normocephalic and atraumatic.  Neck: Normal range of motion.  Cardiovascular: Regular rhythm.  Tachycardia present.   Pulmonary/Chest: Accessory muscle usage present. No stridor. Tachypnea noted. She is in respiratory distress. She has decreased breath sounds. She has wheezes.  Abdominal: She exhibits no distension.  Musculoskeletal: Normal range of motion. She exhibits no edema or tenderness.  Neurological: She is alert and oriented to person, place, and time.  Skin: Skin is warm and dry.  Nursing note and vitals reviewed.   ED Course  Procedures (including critical care time)  CRITICAL CARE Performed by: Merrily Pew Total critical care time: 35 minutes Critical care time was exclusive of separately billable procedures and treating other patients. Critical care was necessary to treat or prevent imminent or life-threatening deterioration. Critical care  was time spent personally by me on the following activities: development of treatment plan with patient and/or surrogate as well as nursing, discussions with consultants, evaluation of patient's response to treatment, examination of patient, obtaining history from patient or surrogate, ordering and performing treatments and interventions, ordering and review of laboratory studies, ordering and review of radiographic studies, pulse oximetry and re-evaluation of patient's condition.   Labs Review Labs Reviewed  BLOOD GAS, ARTERIAL - Abnormal; Notable for the following:    pO2, Arterial 446.0 (*)    Bicarbonate 24.9 (*)    All other components within normal limits  CBC WITH DIFFERENTIAL/PLATELET - Abnormal; Notable for the following:    WBC 10.6 (*)    Hemoglobin 11.9 (*)    MCH 24.7 (*)    Lymphs Abs 5.5 (*)    All other components within normal  limits  COMPREHENSIVE METABOLIC PANEL - Abnormal; Notable for the following:    Glucose, Bld 120 (*)    Creatinine, Ser 1.25 (*)    GFR calc non Af Amer 42 (*)    GFR calc Af Amer 49 (*)    All other components within normal limits  CBC  BASIC METABOLIC PANEL  I-STAT TROPOININ, ED    Imaging Review Dg Chest Portable 1 View  09/20/2015  CLINICAL DATA:  Shortness of breath, difficulty breathing, asthma attack, cough EXAM: PORTABLE CHEST 1 VIEW COMPARISON:  06/21/2015 FINDINGS: Lungs are clear.  No pleural effusion or pneumothorax. The heart is normal in size. Right shoulder arthroplasty. Degenerative changes of the left shoulder. IMPRESSION: No evidence of acute cardiopulmonary disease. Electronically Signed   By: Julian Hy M.D.   On: 09/20/2015 19:20   I have personally reviewed and evaluated these images and lab results as part of my medical decision-making.   EKG Interpretation   Date/Time:  Saturday September 20 2015 18:56:19 EDT Ventricular Rate:  136 PR Interval:    QRS Duration: 103 QT Interval:  307 QTC Calculation: 461 R Axis:   -72 Text Interpretation:  Sinus tachycardia Ventricular premature complex Left  axis deviation Low voltage, extremity and precordial leads Minimal ST  elevation, lateral leads Baseline wander in lead(s) II V3 V4 V5 V6  significant wander Confirmed by [facility] MD, Corene Cornea [phone]) on 09/20/2015  8:24:00 PM      MDM   Final diagnoses:  Asthma exacerbation  Acute respiratory failure, unspecified whether with hypoxia or hypercapnia (HCC)  CKD (chronic kidney disease) stage 3, GFR 30-59 ml/min   75 year old female here with respiratory distress likely secondary to asthma exacerbation. Initially on BiPAP secondary to decreased breath sounds in significant distress with retractions and accessory muscle usage. Given magnesium, steroids, albuterol, ipratropium. Multiple re-evaluations of patient with persistent improvement. Also given a dose of antianxiety  medication which seemed to help tolerate the BiPAP even more. After being on BiPAP for couple hours she was taken off and transition to 4 L and then down to 1 L of oxygen. Head persistent tachypnea throughout that period secondary to her presentation admitted the patient to the hospital for observation overnight to ensure that her symptoms continued to be improved.  Merrily Pew, MD 09/21/15 [phone]

## 2015-09-20 NOTE — ED Notes (Signed)
Breathing and anxiety much improved since Bipap and Ativan. Decrease work of breathing. Family at bedside

## 2015-09-20 NOTE — Progress Notes (Signed)
Patient off BiPAP for now. , Machine on hold.

## 2015-09-20 NOTE — ED Notes (Signed)
Report called to Santiago Glad on Dept 300, all questions answered

## 2015-09-20 NOTE — ED Notes (Signed)
Pt c/o difficulty breathing since Thursday c/o "asthma attack" ongoing since Thursday. Pt reports productive clear cough. Pt reports she has been taking her inhalers at home with no help. O2 sat 99% on RA. Pt has increased work of breathing.

## 2015-09-21 ENCOUNTER — Inpatient Hospital Stay (HOSPITAL_COMMUNITY): Payer: PPO

## 2015-09-21 DIAGNOSIS — J441 Chronic obstructive pulmonary disease with (acute) exacerbation: Secondary | ICD-10-CM | POA: Diagnosis not present

## 2015-09-21 DIAGNOSIS — I5033 Acute on chronic diastolic (congestive) heart failure: Secondary | ICD-10-CM | POA: Diagnosis not present

## 2015-09-21 DIAGNOSIS — Z7951 Long term (current) use of inhaled steroids: Secondary | ICD-10-CM | POA: Diagnosis not present

## 2015-09-21 DIAGNOSIS — R0600 Dyspnea, unspecified: Secondary | ICD-10-CM | POA: Diagnosis not present

## 2015-09-21 DIAGNOSIS — Z96651 Presence of right artificial knee joint: Secondary | ICD-10-CM | POA: Diagnosis not present

## 2015-09-21 DIAGNOSIS — R0602 Shortness of breath: Secondary | ICD-10-CM | POA: Diagnosis not present

## 2015-09-21 DIAGNOSIS — J9600 Acute respiratory failure, unspecified whether with hypoxia or hypercapnia: Secondary | ICD-10-CM | POA: Diagnosis not present

## 2015-09-21 DIAGNOSIS — I10 Essential (primary) hypertension: Secondary | ICD-10-CM | POA: Diagnosis not present

## 2015-09-21 DIAGNOSIS — Z96611 Presence of right artificial shoulder joint: Secondary | ICD-10-CM | POA: Diagnosis not present

## 2015-09-21 DIAGNOSIS — J45901 Unspecified asthma with (acute) exacerbation: Secondary | ICD-10-CM | POA: Diagnosis not present

## 2015-09-21 DIAGNOSIS — Z803 Family history of malignant neoplasm of breast: Secondary | ICD-10-CM | POA: Diagnosis not present

## 2015-09-21 DIAGNOSIS — I130 Hypertensive heart and chronic kidney disease with heart failure and stage 1 through stage 4 chronic kidney disease, or unspecified chronic kidney disease: Secondary | ICD-10-CM | POA: Diagnosis not present

## 2015-09-21 DIAGNOSIS — K219 Gastro-esophageal reflux disease without esophagitis: Secondary | ICD-10-CM | POA: Diagnosis not present

## 2015-09-21 DIAGNOSIS — Z800 Family history of malignant neoplasm of digestive organs: Secondary | ICD-10-CM | POA: Diagnosis not present

## 2015-09-21 DIAGNOSIS — F419 Anxiety disorder, unspecified: Secondary | ICD-10-CM | POA: Diagnosis not present

## 2015-09-21 DIAGNOSIS — Z87891 Personal history of nicotine dependence: Secondary | ICD-10-CM | POA: Diagnosis not present

## 2015-09-21 DIAGNOSIS — N183 Chronic kidney disease, stage 3 (moderate): Secondary | ICD-10-CM | POA: Diagnosis not present

## 2015-09-21 DIAGNOSIS — J9601 Acute respiratory failure with hypoxia: Secondary | ICD-10-CM | POA: Diagnosis not present

## 2015-09-21 LAB — BASIC METABOLIC PANEL
ANION GAP: 11 (ref 5–15)
BUN: 12 mg/dL (ref 6–20)
CHLORIDE: 102 mmol/L (ref 101–111)
CO2: 21 mmol/L — AB (ref 22–32)
Calcium: 8.9 mg/dL (ref 8.9–10.3)
Creatinine, Ser: 1.29 mg/dL — ABNORMAL HIGH (ref 0.44–1.00)
GFR calc Af Amer: 46 mL/min — ABNORMAL LOW (ref >60)
GFR, EST NON AFRICAN AMERICAN: 40 mL/min — AB (ref >60)
GLUCOSE: 177 mg/dL — AB (ref 65–99)
POTASSIUM: 4 mmol/L (ref 3.5–5.1)
Sodium: 134 mmol/L — ABNORMAL LOW (ref 135–145)

## 2015-09-21 LAB — TSH: TSH: 1.877 u[IU]/mL (ref 0.350–4.500)

## 2015-09-21 LAB — CBC
HEMATOCRIT: 35 % — AB (ref 36.0–46.0)
HEMOGLOBIN: 10.6 g/dL — AB (ref 12.0–15.0)
MCH: 23.9 pg — AB (ref 26.0–34.0)
MCHC: 30.3 g/dL (ref 30.0–36.0)
MCV: 78.8 fL (ref 78.0–100.0)
PLATELETS: 202 10*3/uL (ref 150–400)
RBC: 4.44 MIL/uL (ref 3.87–5.11)
RDW: 14.8 % (ref 11.5–15.5)
WBC: 6.3 10*3/uL (ref 4.0–10.5)

## 2015-09-21 MED ORDER — IRBESARTAN 300 MG PO TABS
300.0000 mg | ORAL_TABLET | Freq: Every day | ORAL | Status: DC
  Administered 2015-09-21 – 2015-09-25 (×5): 300 mg via ORAL
  Filled 2015-09-21 (×5): qty 1

## 2015-09-21 MED ORDER — FUROSEMIDE 10 MG/ML IJ SOLN
40.0000 mg | Freq: Once | INTRAMUSCULAR | Status: AC
  Administered 2015-09-21: 40 mg via INTRAVENOUS
  Filled 2015-09-21: qty 4

## 2015-09-21 MED ORDER — METHYLPREDNISOLONE SODIUM SUCC 125 MG IJ SOLR
60.0000 mg | Freq: Four times a day (QID) | INTRAMUSCULAR | Status: DC
Start: 2015-09-21 — End: 2015-09-25
  Administered 2015-09-21 – 2015-09-25 (×15): 60 mg via INTRAVENOUS
  Filled 2015-09-21 (×15): qty 2

## 2015-09-21 MED ORDER — ALBUTEROL SULFATE (2.5 MG/3ML) 0.083% IN NEBU
2.5000 mg | INHALATION_SOLUTION | RESPIRATORY_TRACT | Status: DC | PRN
  Administered 2015-09-21: 2.5 mg via RESPIRATORY_TRACT
  Filled 2015-09-21: qty 3

## 2015-09-21 MED ORDER — LEVALBUTEROL HCL 0.63 MG/3ML IN NEBU
INHALATION_SOLUTION | RESPIRATORY_TRACT | Status: AC
  Administered 2015-09-21: 0.63 mg
  Filled 2015-09-21: qty 3

## 2015-09-21 MED ORDER — HYDROCHLOROTHIAZIDE 12.5 MG PO CAPS
12.5000 mg | ORAL_CAPSULE | Freq: Every day | ORAL | Status: DC
  Administered 2015-09-21 – 2015-09-25 (×5): 12.5 mg via ORAL
  Filled 2015-09-21 (×5): qty 1

## 2015-09-21 NOTE — Care Management Obs Status (Signed)
[facility] [place] NOTIFICATION   Patient Details  Name: Glenda Terry MRN: [id number] Date of Birth: [birth year]   Medicare Observation Status Notification Given:  Yes    Briant Sites, RN 09/21/2015, 1:02 PM

## 2015-09-21 NOTE — Progress Notes (Signed)
[facility] PROGRESS NOTE  Glenda Terry [id number] DOB: 02-18-1941    PCP:   Wende Neighbors, MD   HPI: Glenda Terry is an 75 y.o. female  With hx of COPD/Asthma not on home oxygen, seeing pulmonologist at [facility], though she would like to have local specialist, preferably in [place], hx of obesity, unclear if she has sleep apnea, HTN,  GERD, presented with SOB for a few days.  She has productive coughs.  She was admitted for COPD exacerbation, given IV steroid and neb, and IVF.  She was given Doxy for her COPD exacerbation.  She said she is a little better, but she is still quite dyspneic.  She never had a chest CT, and she had a normal EF in 2014 via ECHO.   Rewiew of Systems:  Constitutional: Negative for malaise, fever and chills. No significant weight loss or weight gain Eyes: Negative for eye pain, redness and discharge, diplopia, visual changes, or flashes of light. ENMT: Negative for ear pain, hoarseness, nasal congestion, sinus pressure and sore throat. No headaches; tinnitus, drooling, or problem swallowing. Cardiovascular: Negative for chest pain, palpitations, diaphoresis, dyspnea and peripheral edema. ; No orthopnea, PND Respiratory: Negative for cough, hemoptysis, wheezing and stridor. No pleuritic chestpain. Gastrointestinal: Negative for nausea, vomiting, diarrhea, constipation, abdominal pain, melena, blood in stool, hematemesis, jaundice and rectal bleeding.    Genitourinary: Negative for frequency, dysuria, incontinence,flank pain and hematuria; Musculoskeletal: Negative for back pain and neck pain. Negative for swelling and trauma.;  Skin: . Negative for pruritus, rash, abrasions, bruising and skin lesion.; ulcerations Neuro: Negative for headache, lightheadedness and neck stiffness. Negative for weakness, altered level of consciousness , altered mental status, extremity weakness, burning feet, involuntary movement, seizure and syncope.  Psych: negative for  anxiety, depression, insomnia, tearfulness, panic attacks, hallucinations, paranoia, suicidal or homicidal ideation    Past Medical History  Diagnosis Date  . COPD (chronic obstructive pulmonary disease) ([place])   . Back pain, chronic   . Anxiety   . Hypertension   . GERD (gastroesophageal reflux disease)   . Arthritis     inflammatory arthritis  . Anemia NOV 2013  . Tubular adenoma 11/2012  . Complication of anesthesia 1985    [facility]ITALS SIGNS DROPPED AND COULD NOT TAKE PAIN MEDS UNTIL VITALS IMPROVED  . Asthma 1991    not COPD per [facility] Pulmonology    Past Surgical History  Procedure Laterality Date  . Tubal ligation    . Abdominal hysterectomy    . Knee arthroscopy Right   . 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 Decompressive lumbar laminectomy level 2  04/20/2012    Procedure: DECOMPRESSIVE LUMBAR LAMINECTOMY LEVEL 2;  Surgeon: Tobi Bastos, MD;  [facility];  Service: Orthopedics;  Laterality: Right;  Decompressive Lumbar Laminectomy of the L4 - L5 and L5 - S1 Complete/Laminectomy L5 on the Right (X-Ray)  . Colonoscopy  06/19/2008    RMR: tortuous and elongated colon with scattered left-sided diverticula/colonic mucosa appeared entirely normal. Prior colonic ulcers had resolved.  . Esophagogastroduodenoscopy  12/2007    Dr. 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 Small hiatal hernia. Slightly pale duodenal mucosa (biopsy unremarkable)  . Colonoscopy  12/2007    Dr. Gala Romney: Scattered diffuse sigmoid diverticula, 2 areas of ulceration at the hepatic flexure. Biopsies unremarkable.  . Colonoscopy N/A  11/20/2012    next TCS 11/2017  . Back surgery    . Carpal tunnel release Right 4/14  . Reverse shoulder arthroplasty Right 05/24/2014    Procedure: RIGHT SHOULDER REVERSE ARTHROPLASTY;  Surgeon: Netta Cedars, MD;  Location: [facility];  Service: Orthopedics;  Laterality: Right;  . Cataract extraction w/phaco Left 11/19/2014    Procedure: CATARACT EXTRACTION PHACO AND INTRAOCULAR LENS PLACEMENT (IOC);  Surgeon: Williams Che, MD;  Location: [facility];  Service: Ophthalmology;  Laterality: Left;  CDE: 4.77  . Cataract extraction w/phaco Right 02/03/2015    Procedure: CATARACT EXTRACTION PHACO AND INTRAOCULAR LENS PLACEMENT (IOC);  Surgeon: Williams Che, MD;  Location: [facility];  Service: Ophthalmology;  Laterality: Right;  CDE: 4.54    Medications:  HOME MEDS: Prior to Admission medications   Medication Sig Start Date End Date Taking? Authorizing Provider  albuterol (ACCUNEB) 0.63 MG/3ML nebulizer solution Take 1 ampule by nebulization every 6 (six) hours as needed for wheezing or shortness of breath.  04/10/15  Yes Historical Provider, MD  albuterol (PROAIR HFA) 108 (90 BASE) MCG/ACT inhaler Inhale 2 puffs into the lungs every 6 (six) hours as needed for wheezing or shortness of breath. For COPD   Yes Historical Provider, MD  ALPRAZolam Duanne Moron) 0.5 MG tablet Take 0.5 mg by mouth 4 (four) times daily as needed for anxiety. For anxiety   Yes Historical Provider, MD  budesonide-formoterol (SYMBICORT) 160-4.5 MCG/ACT inhaler Inhale 2 puffs into the lungs 2 (two) times daily.   Yes Historical Provider, MD  EPINEPHrine (EPIPEN 2-PAK) 0.3 mg/0.3 mL DEVI Inject 0.3 mg into the muscle once. Reported on 08/21/2015   Yes Historical Provider, MD  ferrous sulfate 325 (65 FE) MG tablet Take 325 mg by mouth daily with breakfast.   Yes Historical Provider, MD  folic acid (FOLVITE) 1 MG tablet Take 1 mg by mouth daily.   Yes Historical Provider, MD  loratadine (CLARITIN) 10 MG tablet Take 10 mg by mouth daily.    Yes Historical Provider, MD  montelukast (SINGULAIR) 10 MG tablet Take 10 mg by mouth at bedtime.  04/12/15  Yes Historical Provider, MD  pantoprazole (PROTONIX) 40 MG tablet Take 40 mg by mouth at bedtime.     Yes Historical Provider, MD  telmisartan-hydrochlorothiazide (MICARDIS HCT) 80-12.5 MG per tablet Take 1 tablet by mouth daily.  10/09/12  Yes Historical Provider, MD  hydrocortisone (PROCTOZONE-HC) 2.5 % rectal cream Place 1 application rectally 2 (two) times daily. Patient not taking: Reported on 09/20/2015 08/21/15   Mahala Menghini, PA-C     Allergies:  Allergies  Allergen Reactions  . Flexeril [Cyclobenzaprine] Anaphylaxis  . Other Anaphylaxis    ALL TREE NUTS  . Keflex [Cephalexin]     ITCH  . Aspirin     Cannot take due to ulcers  . Statins Other (See Comments)    is pain and swelling   . Adhesive [Tape] Rash  . Chlorhexidine Rash    Social History:   reports that she quit smoking about 23 years ago. Her smoking use included Cigarettes. She has a 80 pack-year smoking history. She has never used smokeless tobacco. She reports that she does not drink alcohol or use illicit drugs.  Family History: Family History  Problem Relation Age of Onset  . Breast cancer Sister   . Colon cancer Sister     two deceased, one living and terminal, one current undergoing treatment, ages 15, 67, 71, 76     Physical Exam:  Filed Vitals:   09/21/15 0614 09/21/15 1030 09/21/15 1424 09/21/15 1454  BP:    141/68  Pulse:    110  Temp:    97.8 F (36.6 C)  TempSrc:    Oral  Resp:    18  Height:      Weight:      SpO2: 96% 99% 98% 100%   Blood pressure 141/68, pulse 110, temperature 97.8 F (36.6 C), temperature source Oral, resp. rate 18, height 5' (1.524 m), weight 91.717 kg (202 lb 3.2 oz), SpO2 100 %.  GEN:  Pleasant  patient lying in the stretcher in no acute distress; cooperative with exam. PSYCH:  alert and oriented x4; does not appear anxious or depressed; affect is appropriate. HEENT: Mucous membranes pink and anicteric; PERRLA; EOM intact; no cervical lymphadenopathy nor thyromegaly or carotid bruit; no JVD; There were no stridor. Neck is very supple. Breasts:: Not examined CHEST  WALL: No tenderness CHEST: Normal respiration, tight wheezing bilaterally.  No rales.  HEART: Regular rate and rhythm.  There are no murmur, rub, or gallops.   BACK: No kyphosis or scoliosis; no CVA tenderness ABDOMEN: soft and non-tender; no masses, no organomegaly, normal abdominal bowel sounds; no pannus; no intertriginous candida. There is no rebound and no distention. Rectal Exam: Not done EXTREMITIES: No bone or joint deformity; age-appropriate arthropathy of the hands and knees; no edema; no ulcerations.  There is no calf tenderness. Genitalia: not examined PULSES: 2+ and symmetric SKIN: Normal hydration no rash or ulceration CNS: Cranial nerves 2-12 grossly intact no focal lateralizing neurologic deficit.  Speech is fluent; uvula elevated with phonation, facial symmetry and tongue midline. DTR are normal bilaterally, cerebella exam is intact, barbinski is negative and strengths are equaled bilaterally.  No sensory loss.   Labs on Admission:  Basic Metabolic Panel:  Recent Labs Lab 09/20/15 1900 09/21/15 0626  NA 138 134*  K 4.0 4.0  CL 104 102  CO2 24 21*  GLUCOSE 120* 177*  BUN 13 12  CREATININE 1.25* 1.29*  CALCIUM 9.0 8.9   Liver Function Tests:  Recent Labs Lab 09/20/15 1900  AST 31  ALT 26  ALKPHOS 82  BILITOT 0.4  PROT 7.5  ALBUMIN 4.3   CBC:  Recent Labs Lab 09/20/15 1900 09/21/15 0626  WBC 10.6* 6.3  NEUTROABS 4.0  --   HGB 11.9* 10.6*  HCT 37.8 35.0*  MCV 78.6 78.8  PLT 245 202   Radiological Exams on Admission: Dg Chest Portable 1 View  09/20/2015  CLINICAL DATA:  Shortness of breath, difficulty breathing, asthma attack, cough EXAM: PORTABLE CHEST 1 VIEW COMPARISON:  06/21/2015 FINDINGS: Lungs are clear.  No pleural effusion or pneumothorax. The heart is normal in size. Right shoulder arthroplasty. Degenerative changes of the left shoulder. IMPRESSION: No evidence of acute cardiopulmonary disease. Electronically Signed   By: Julian Hy  M.D.   On: 09/20/2015 19:20    EKG: Independently reviewed.    Assessment/Plan Present on Admission:  . Anxiety . GERD (gastroesophageal reflux disease) . HTN (hypertension) . Acute respiratory failure ([place]) . CKD (chronic kidney disease) stage 3, GFR 30-59 ml/min  PLAN:  Acute respiratory failure:  Continue with IV steroids, neb, and oral antibiotics.  Will obtain ECHO to see if she has any component of CHF.   Her COPD is severe, will obtain CT chest with contrast as she said she never had chest CT before.    Anxiety -Continue Xanax  -I have reviewed this patient in  the [place] Controlled Substances Reporting System. She is receiving her medications from only one provider and appears to be taking them as prescribed.  HTN:  Continue with Micardis-HCT.  Will give one dose of IV Lasix and stop her IVF, as I think she may have volume overload at this time.   CKD Stage 3 -Not previously documented as diagnosis, but creatinine today is essentially the same as in April 2017 and so this is presumed to be chronic -Patient does have longstanding HTN and so CKD would not be unexpected -Does not need renal adjustment of medications at this time.  GERD -Continue home Protonix   Other plans as per orders. Code Status: FULL Haskel Khan, MD.  FACP [facility] Pager [phone] 7pm to 7am.  09/21/2015, 5:31 PM

## 2015-09-21 NOTE — Progress Notes (Signed)
Radiology called to report to the 1st shift nurse that the CT machine is down at this time and they are unable to obtain patient's CT chest scan today. MD notified.

## 2015-09-22 ENCOUNTER — Inpatient Hospital Stay (HOSPITAL_COMMUNITY): Payer: PPO

## 2015-09-22 DIAGNOSIS — R0600 Dyspnea, unspecified: Secondary | ICD-10-CM | POA: Diagnosis not present

## 2015-09-22 DIAGNOSIS — R918 Other nonspecific abnormal finding of lung field: Secondary | ICD-10-CM | POA: Diagnosis not present

## 2015-09-22 LAB — ECHOCARDIOGRAM COMPLETE
Height: 60 in
Weight: 3235.2 oz

## 2015-09-22 MED ORDER — IOPAMIDOL (ISOVUE-300) INJECTION 61%
75.0000 mL | Freq: Once | INTRAVENOUS | Status: AC | PRN
  Administered 2015-09-22: 75 mL via INTRAVENOUS

## 2015-09-22 MED ORDER — FUROSEMIDE 10 MG/ML IJ SOLN
40.0000 mg | Freq: Two times a day (BID) | INTRAMUSCULAR | Status: DC
  Administered 2015-09-22: 40 mg via INTRAVENOUS
  Filled 2015-09-22: qty 4

## 2015-09-22 MED ORDER — FUROSEMIDE 10 MG/ML IJ SOLN
40.0000 mg | Freq: Every day | INTRAMUSCULAR | Status: DC
  Administered 2015-09-23 – 2015-09-25 (×3): 40 mg via INTRAVENOUS
  Filled 2015-09-22 (×3): qty 4

## 2015-09-22 NOTE — Progress Notes (Signed)
[facility] PROGRESS NOTE  Glenda Terry [id number] DOB: [birth year]    PCP:   Wende Neighbors, MD   HPI:  Glenda Terry is an 75 y.o. female With hx of COPD/Asthma not on home oxygen, seeing pulmonologist at [facility], though she would like to have local specialist, preferably in [place], hx of obesity, unclear if she has sleep apnea, HTN, GERD, presented with SOB for a few days. She has productive coughs. She was admitted for COPD exacerbation, given IV steroid and neb, and IVF. She was given Doxy for her COPD exacerbation. She said she is a little better, but she is still quite dyspneic. She never had a chest CT, and she had a normal EF in 2014 via ECHO. Repeat CT with contrast showed possible infiltrate on the RML, and ECHO showed only grade I diastolic dysfx.   She is very anxious.    Rewiew of Systems:  Constitutional: Negative for malaise, fever and chills. No significant weight loss or weight gain Eyes: Negative for eye pain, redness and discharge, diplopia, visual changes, or flashes of light. ENMT: Negative for ear pain, hoarseness, nasal congestion, sinus pressure and sore throat. No headaches; tinnitus, drooling, or problem swallowing. Cardiovascular: Negative for chest pain, palpitations, diaphoresis, dyspnea and peripheral edema. ; No orthopnea, PND Respiratory: Negative for cough, hemoptysis, wheezing and stridor. No pleuritic chestpain. Gastrointestinal: Negative for nausea, vomiting, diarrhea, constipation, abdominal pain, melena, blood in stool, hematemesis, jaundice and rectal bleeding.    Genitourinary: Negative for frequency, dysuria, incontinence,flank pain and hematuria; Musculoskeletal: Negative for back pain and neck pain. Negative for swelling and trauma.;  Skin: . Negative for pruritus, rash, abrasions, bruising and skin lesion.; ulcerations Neuro: Negative for headache, lightheadedness and neck stiffness. Negative for weakness, altered level of  consciousness , altered mental status, extremity weakness, burning feet, involuntary movement, seizure and syncope.  Psych: negative for anxiety, depression, insomnia, tearfulness, panic attacks, hallucinations, paranoia, suicidal or homicidal ideation    Past Medical History  Diagnosis Date  . COPD (chronic obstructive pulmonary disease) ([place])   . Back pain, chronic   . Anxiety   . Hypertension   . GERD (gastroesophageal reflux disease)   . Arthritis     inflammatory arthritis  . Anemia NOV 2013  . Tubular adenoma 11/2012  . Complication of anesthesia 1985    VITALS SIGNS DROPPED AND COULD NOT TAKE PAIN MEDS UNTIL VITALS IMPROVED  . Asthma 1991    not COPD per [facility] Pulmonology    Past Surgical History  Procedure Laterality Date  . Tubal ligation    . Abdominal hysterectomy    . Knee arthroscopy Right   . 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 Decompressive lumbar laminectomy level 2  04/20/2012    Procedure: DECOMPRESSIVE LUMBAR LAMINECTOMY LEVEL 2;  Surgeon: Tobi Bastos, MD;  Location: [facility];  Service: Orthopedics;  Laterality: Right;  Decompressive Lumbar Laminectomy of the L4 - L5 and L5 - S1 Complete/Laminectomy L5 on the Right (X-Ray)  . Colonoscopy  06/19/2008    RMR: tortuous and elongated colon with scattered left-sided diverticula/colonic mucosa appeared entirely normal. Prior colonic ulcers had resolved.  . Esophagogastroduodenoscopy  12/2007    Dr. 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 Small hiatal hernia. Slightly pale duodenal mucosa (biopsy unremarkable)  . Colonoscopy  12/2007    Dr.  Rourk: Scattered diffuse sigmoid diverticula, 2 areas of ulceration at the hepatic flexure. Biopsies unremarkable.  . Colonoscopy N/A 11/20/2012    next TCS 11/2017  . Back surgery    . Carpal  tunnel release Right 4/14  . Reverse shoulder arthroplasty Right 05/24/2014    Procedure: RIGHT SHOULDER REVERSE ARTHROPLASTY;  Surgeon: Netta Cedars, MD;  Location: [place];  Service: Orthopedics;  Laterality: Right;  . Cataract extraction w/phaco Left 11/19/2014    Procedure: CATARACT EXTRACTION PHACO AND INTRAOCULAR LENS PLACEMENT (IOC);  Surgeon: Williams Che, MD;  Location: [facility];  Service: Ophthalmology;  Laterality: Left;  CDE: 4.77  . Cataract extraction w/phaco Right 02/03/2015    Procedure: CATARACT EXTRACTION PHACO AND INTRAOCULAR LENS PLACEMENT (IOC);  Surgeon: Williams Che, MD;  Location: [facility];  Service: Ophthalmology;  Laterality: Right;  CDE: 4.54    Medications:  HOME MEDS: Prior to Admission medications   Medication Sig Start Date End Date Taking? Authorizing Provider  albuterol (ACCUNEB) 0.63 MG/3ML nebulizer solution Take 1 ampule by nebulization every 6 (six) hours as needed for wheezing or shortness of breath.  04/10/15  Yes Historical Provider, MD  albuterol (PROAIR HFA) 108 (90 BASE) MCG/ACT inhaler Inhale 2 puffs into the lungs every 6 (six) hours as needed for wheezing or shortness of breath. For COPD   Yes Historical Provider, MD  ALPRAZolam Duanne Moron) 0.5 MG tablet Take 0.5 mg by mouth 4 (four) times daily as needed for anxiety. For anxiety   Yes Historical Provider, MD  budesonide-formoterol (SYMBICORT) 160-4.5 MCG/ACT inhaler Inhale 2 puffs into the lungs 2 (two) times daily.   Yes Historical Provider, MD  EPINEPHrine (EPIPEN 2-PAK) 0.3 mg/0.3 mL DEVI Inject 0.3 mg into the muscle once. Reported on 08/21/2015   Yes Historical Provider, MD  ferrous sulfate 325 (65 FE) MG tablet Take 325 mg by mouth daily with breakfast.   Yes Historical Provider, MD  folic acid (FOLVITE) 1 MG tablet Take 1 mg by mouth daily.   Yes Historical Provider, MD  loratadine (CLARITIN) 10 MG tablet Take 10 mg by mouth daily.    Yes Historical Provider, MD  montelukast (SINGULAIR) 10 MG tablet  Take 10 mg by mouth at bedtime.  04/12/15  Yes Historical Provider, MD  pantoprazole (PROTONIX) 40 MG tablet Take 40 mg by mouth at bedtime.    Yes Historical Provider, MD  telmisartan-hydrochlorothiazide (MICARDIS HCT) 80-12.5 MG per tablet Take 1 tablet by mouth daily.  10/09/12  Yes Historical Provider, MD  hydrocortisone (PROCTOZONE-HC) 2.5 % rectal cream Place 1 application rectally 2 (two) times daily. Patient not taking: Reported on 09/20/2015 08/21/15   Mahala Menghini, PA-C     Allergies:  Allergies  Allergen Reactions  . Flexeril [Cyclobenzaprine] Anaphylaxis  . Other Anaphylaxis    ALL TREE NUTS  . Keflex [Cephalexin]     ITCH  . Aspirin     Cannot take due to ulcers  . Statins Other (See Comments)    is pain and swelling   . Adhesive [Tape] Rash  . Chlorhexidine Rash    Social History:   reports that she quit smoking about 23 years ago. Her smoking use included Cigarettes. She has a 80 pack-year smoking history. She has never used smokeless tobacco. She reports that she does not drink alcohol or use illicit drugs.  Family History: Family History  Problem Relation Age of Onset  . Breast cancer Sister   . Colon cancer Sister     two deceased,  one living and terminal, one current undergoing treatment, ages 8, 62, 47, 49     Physical Exam: Filed Vitals:   09/22/15 0617 09/22/15 0631 09/22/15 1510 09/22/15 1531  BP: 140/68   123/78  Pulse: 92   79  Temp: 98 F (36.7 C)   98.1 F (36.7 C)  TempSrc: Oral     Resp: 18   18  Height:      Weight:      SpO2: 98% 98% 97% 99%   Blood pressure 123/78, pulse 79, temperature 98.1 F (36.7 C), temperature source Oral, resp. rate 18, height 5' (1.524 m), weight 91.717 kg (202 lb 3.2 oz), SpO2 99 %.  GEN:  Pleasant patient lying in the stretcher in no acute distress; cooperative with exam. PSYCH:  alert and oriented x4; does not appear anxious or depressed; affect is appropriate. HEENT: Mucous membranes pink and anicteric;  PERRLA; EOM intact; no cervical lymphadenopathy nor thyromegaly or carotid bruit; no JVD; There were no stridor. Neck is very supple. Breasts:: Not examined CHEST WALL: No tenderness CHEST: Normal respiration, bilateral wheezing.  Significant, but has improved.  HEART: Regular rate and rhythm.  There are no murmur, rub, or gallops.   BACK: No kyphosis or scoliosis; no CVA tenderness ABDOMEN: soft and non-tender; no masses, no organomegaly, normal abdominal bowel sounds; no pannus; no intertriginous candida. There is no rebound and no distention. Rectal Exam: Not done EXTREMITIES: No bone or joint deformity; age-appropriate arthropathy of the hands and knees; no edema; no ulcerations.  There is no calf tenderness. Genitalia: not examined PULSES: 2+ and symmetric SKIN: Normal hydration no rash or ulceration CNS: Cranial nerves 2-12 grossly intact no focal lateralizing neurologic deficit.  Speech is fluent; uvula elevated with phonation, facial symmetry and tongue midline. DTR are normal bilaterally, cerebella exam is intact, barbinski is negative and strengths are equaled bilaterally.  No sensory loss.   Labs on Admission:  Basic Metabolic Panel:  Recent Labs Lab 09/20/15 1900 09/21/15 0626  NA 138 134*  K 4.0 4.0  CL 104 102  CO2 24 21*  GLUCOSE 120* 177*  BUN 13 12  CREATININE 1.25* 1.29*  CALCIUM 9.0 8.9   Liver Function Tests:  Recent Labs Lab 09/20/15 1900  AST 31  ALT 26  ALKPHOS 82  BILITOT 0.4  PROT 7.5  ALBUMIN 4.3   CBC:  Recent Labs Lab 09/20/15 1900 09/21/15 0626  WBC 10.6* 6.3  NEUTROABS 4.0  --   HGB 11.9* 10.6*  HCT 37.8 35.0*  MCV 78.6 78.8  PLT 245 202   Radiological Exams on Admission: Ct Chest W Contrast  09/22/2015  CLINICAL DATA:  Shortness of breath and mid chest tightness for 3 weeks, cough, history COPD, hypertension, former smoker, asthma EXAM: CT CHEST WITH CONTRAST TECHNIQUE: Multidetector CT imaging of the chest was performed during  intravenous contrast administration. Sagittal and coronal MPR images reconstructed from axial data set. CONTRAST:  100mL ISOVUE-300 IOPAMIDOL (ISOVUE-300) INJECTION 61% IV COMPARISON:  02/08/2011 FINDINGS: Cardiovascular: Mild atherosclerotic calcification at aortic arch. Aorta normal caliber. Mediastinum/Lymph Nodes: Esophagus unremarkable. Normal size mediastinal lymph nodes. No thoracic adenopathy. Lungs/Pleura: Ground-glass opacity in medial RIGHT middle lobe. Remaining lungs clear. No infiltrate, pleural effusion or pneumothorax. Upper abdomen: Visualized upper abdomen unremarkable Musculoskeletal: Diffuse osseous demineralization. RIGHT shoulder prosthesis. No significant focal osseous abnormalities. IMPRESSION: Ground-glass opacity in medial RIGHT middle lobe question, nonspecific, could represent infection though other causes of non solid opacity including non solid tumor are not excluded.  Initial follow-up with CT at 6-12 months is recommended to assess resolution versus persistence. If persistent, repeat CT is recommended every 2 years until 5 years of stability has been established. This recommendation follows the consensus statement: Guidelines for Management of Incidental Pulmonary Nodules Detected on CT Images:From the Fleischner Society 2017; published online before print (10.1148/radiol.IJ:2314499). Electronically Signed   By: Lavonia Dana M.D.   On: 09/22/2015 11:42   Dg Chest Portable 1 View  09/20/2015  CLINICAL DATA:  Shortness of breath, difficulty breathing, asthma attack, cough EXAM: PORTABLE CHEST 1 VIEW COMPARISON:  06/21/2015 FINDINGS: Lungs are clear.  No pleural effusion or pneumothorax. The heart is normal in size. Right shoulder arthroplasty. Degenerative changes of the left shoulder. IMPRESSION: No evidence of acute cardiopulmonary disease. Electronically Signed   By: Julian Hy M.D.   On: 09/20/2015 19:20    Assessment/Plan Present on Admission:  . Anxiety . GERD  (gastroesophageal reflux disease) . HTN (hypertension) . Acute respiratory failure ([place]) . CKD (chronic kidney disease) stage 3, GFR 30-59 ml/min . Asthma exacerbation  PLAN:  COPD exacerbation:  Slow to respond, but is doing better.  Will continue with IV steroids, neb and oral antibiotics.  I am not convinced she has PNA.  She has mild diastolic CHF, and will continue with low dose lasix.   HTN:  Controlled.   CKD:  Stable.  Cr 1.3.   GERD:  Can aggravate her asthma.  Being treated with PPI.   Other plans as per orders. Code Status: FULL Haskel Khan, MD.  FACP [facility] Pager [phone] 7pm to 7am.  09/22/2015, 5:35 PM

## 2015-09-22 NOTE — Progress Notes (Signed)
*  PRELIMINARY RESULTS* Echocardiogram 2D Echocardiogram has been performed.  Samuel Germany 09/22/2015, 2:50 PM

## 2015-09-22 NOTE — Care Management Important Message (Signed)
Important Message  Patient Details  Name: PAISLEI GUSTINE MRN: [id number] Date of Birth: 10/20/1940   Medicare Important Message Given:  Yes    Helmer Dull, Chauncey Reading, RN 09/22/2015, 1:04 PM

## 2015-09-22 NOTE — Care Management Note (Signed)
Case Management Note  Patient Details  Name: MAKINLEY MCCALMAN MRN: [id number] Date of Birth: 26-Jul-1940  Subjective/Objective: Patient lives at home alone. Ind with ADL's, has a cane but does not need it. Reports she drives herself to appointment. Her PCP is Dr. [name] Crane. She reports no issues obtaining medications.                   Action/Plan: Anticipate d/c home with self care. No CM needs identified. Will follow for possible oxygen needs.    Expected Discharge Date:      09/23/2015            Expected Discharge Plan:  Home/Self Care  In-House Referral:     Discharge planning Services  CM Consult  Post Acute Care Choice:  NA Choice offered to:  NA  DME Arranged:    DME Agency:     HH Arranged:    HH Agency:     Status of Service:  Completed, signed off  If discussed at [employer] of Stay Meetings, dates discussed:    Additional Comments:  August Longest, Chauncey Reading, RN 09/22/2015, 12:59 PM

## 2015-09-23 MED ORDER — HYDROCODONE-HOMATROPINE 5-1.5 MG/5ML PO SYRP
5.0000 mL | ORAL_SOLUTION | ORAL | Status: DC | PRN
  Administered 2015-09-23 – 2015-09-25 (×6): 5 mL via ORAL
  Filled 2015-09-23 (×6): qty 5

## 2015-09-23 MED ORDER — MOMETASONE FURO-FORMOTEROL FUM 200-5 MCG/ACT IN AERO
2.0000 | INHALATION_SPRAY | Freq: Two times a day (BID) | RESPIRATORY_TRACT | Status: DC
  Administered 2015-09-23 – 2015-09-25 (×5): 2 via RESPIRATORY_TRACT
  Filled 2015-09-23: qty 8.8

## 2015-09-23 NOTE — Clinical Documentation Improvement (Signed)
Internal Medicine  Please clarify the acuity of the patient's Diastolic CHF and document findings in next progress note. Thank you!    Acuity - Acute, Chronic, Acute on Chronic  Other  Clinically Undetermined  Document any associated diagnoses/conditions  Supporting Information:  "She has mild diastolic CHF, and will continue with low dose lasix".  Being treated with IV Lasix 40 mg Daily   Please exercise your independent, professional judgment when responding. A specific answer is not anticipated or expected.  Thank You,  Norm Parcel RN, BSN, [place] Clinical Documentation Specialist [facility] [phone]; cell [phone]

## 2015-09-23 NOTE — Consult Note (Signed)
   [facility] CM Inpatient Consult   09/23/2015  Glenda Terry 09/10/1940 [id number]  Spoke with patient at bedside regarding the restart of services with [facility]. Patient verbalized  interest in [place] [facility] services and agrees to restart of [facility] program services. Patient signed consent form on file and current in electronic record. Patient will receive post hospital follow up calls and be assessed for home visits. Of note, [facility] Care Management services does not replace or interfere with any services that are arranged by inpatient case management or social work.  For questions, please contact:  Royetta Crochet. Laymond Purser, RN, BSN, [place] [phone]) Business Cell  [phone]) Toll Free Office

## 2015-09-23 NOTE — Progress Notes (Signed)
[facility] PROGRESS NOTE  KAIRY BOLAM [id number] DOB: 1940/09/06    PCP:   Wende Neighbors, MD   HPI:  Glenda Terry is an 75 y.o. female With hx of COPD/Asthma not on home oxygen, seeing pulmonologist at [facility], though she would like to have local specialist, preferably in [place], hx of obesity, unclear if she has sleep apnea, HTN, GERD, presented with SOB for a few days. She has productive coughs. She was admitted for COPD exacerbation, given IV steroid and neb, and IVF. She was given Doxy for her COPD exacerbation. She said she is a little better, but she is still quite dyspneic. She never had a chest CT, and she had a normal EF in 2014 via ECHO. Repeat CT with contrast showed possible infiltrate on the RML, and ECHO showed only grade I diastolic dysfx.It was felt that she likely has some acute on chronic diastolic CHF, so she was given IV lasix with some improvement.  She is getting antibiotics, steroids, nebs, and oxygen.  She is improving very slowly.    Rewiew of Systems:  Constitutional: Negative for malaise, fever and chills. No significant weight loss or weight gain Eyes: Negative for eye pain, redness and discharge, diplopia, visual changes, or flashes of light. ENMT: Negative for ear pain, hoarseness, nasal congestion, sinus pressure and sore throat. No headaches; tinnitus, drooling, or problem swallowing. Cardiovascular: Negative for chest pain, palpitations, diaphoresis, dyspnea and peripheral edema. ; No orthopnea, PND Respiratory: Negative for cough, hemoptysis, wheezing and stridor. No pleuritic chestpain. Gastrointestinal: Negative for nausea, vomiting, diarrhea, constipation, abdominal pain, melena, blood in stool, hematemesis, jaundice and rectal bleeding.    Genitourinary: Negative for frequency, dysuria, incontinence,flank pain and hematuria; Musculoskeletal: Negative for back pain and neck pain. Negative for swelling and trauma.;  Skin: . Negative  for pruritus, rash, abrasions, bruising and skin lesion.; ulcerations Neuro: Negative for headache, lightheadedness and neck stiffness. Negative for weakness, altered level of consciousness , altered mental status, extremity weakness, burning feet, involuntary movement, seizure and syncope.  Psych: negative for anxiety, depression, insomnia, tearfulness, panic attacks, hallucinations, paranoia, suicidal or homicidal ideation    Past Medical History  Diagnosis Date  . COPD (chronic obstructive pulmonary disease) ([place])   . Back pain, chronic   . Anxiety   . Hypertension   . GERD (gastroesophageal reflux disease)   . Arthritis     inflammatory arthritis  . Anemia NOV 2013  . Tubular adenoma 11/2012  . Complication of anesthesia 1985    VITALS SIGNS DROPPED AND COULD NOT TAKE PAIN MEDS UNTIL VITALS IMPROVED  . Asthma 1991    not COPD per [facility] Pulmonology    Past Surgical History  Procedure Laterality Date  . Tubal ligation    . Abdominal hysterectomy    . Knee arthroscopy Right   . 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 Decompressive lumbar laminectomy level 2  04/20/2012    Procedure: DECOMPRESSIVE LUMBAR LAMINECTOMY LEVEL 2;  Surgeon: Tobi Bastos, MD;  Location: [facility];  Service: Orthopedics;  Laterality: Right;  Decompressive Lumbar Laminectomy of the L4 - L5 and L5 - S1 Complete/Laminectomy L5 on the Right (X-Ray)  . Colonoscopy  06/19/2008    RMR: tortuous and elongated colon with scattered left-sided diverticula/colonic mucosa appeared entirely normal. Prior colonic ulcers had resolved.  . Esophagogastroduodenoscopy  12/2007  Dr. 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 Small hiatal hernia. Slightly pale duodenal mucosa (biopsy unremarkable)  . Colonoscopy  12/2007    Dr. Gala Romney: Scattered  diffuse sigmoid diverticula, 2 areas of ulceration at the hepatic flexure. Biopsies unremarkable.  . Colonoscopy N/A 11/20/2012    next TCS 11/2017  . Back surgery    . Carpal tunnel release Right 4/14  . Reverse shoulder arthroplasty Right 05/24/2014    Procedure: RIGHT SHOULDER REVERSE ARTHROPLASTY;  Surgeon: Netta Cedars, MD;  Location: [facility];  Service: Orthopedics;  Laterality: Right;  . Cataract extraction w/phaco Left 11/19/2014    Procedure: CATARACT EXTRACTION PHACO AND INTRAOCULAR LENS PLACEMENT (IOC);  Surgeon: Williams Che, MD;  Location: [facility];  Service: Ophthalmology;  Laterality: Left;  CDE: 4.77  . Cataract extraction w/phaco Right 02/03/2015    Procedure: CATARACT EXTRACTION PHACO AND INTRAOCULAR LENS PLACEMENT (IOC);  Surgeon: Williams Che, MD;  Location: [facility];  Service: Ophthalmology;  Laterality: Right;  CDE: 4.54    Medications:  HOME MEDS: Prior to Admission medications   Medication Sig Start Date End Date Taking? Authorizing Provider  albuterol (ACCUNEB) 0.63 MG/3ML nebulizer solution Take 1 ampule by nebulization every 6 (six) hours as needed for wheezing or shortness of breath.  04/10/15  Yes Historical Provider, MD  albuterol (PROAIR HFA) 108 (90 BASE) MCG/ACT inhaler Inhale 2 puffs into the lungs every 6 (six) hours as needed for wheezing or shortness of breath. For COPD   Yes Historical Provider, MD  ALPRAZolam Duanne Moron) 0.5 MG tablet Take 0.5 mg by mouth 4 (four) times daily as needed for anxiety. For anxiety   Yes Historical Provider, MD  budesonide-formoterol (SYMBICORT) 160-4.5 MCG/ACT inhaler Inhale 2 puffs into the lungs 2 (two) times daily.   Yes Historical Provider, MD  EPINEPHrine (EPIPEN 2-PAK) 0.3 mg/0.3 mL DEVI Inject 0.3 mg into the muscle once. Reported on 08/21/2015   Yes Historical Provider, MD  ferrous sulfate 325 (65 FE) MG tablet Take 325 mg by mouth daily with breakfast.   Yes Historical Provider, MD  folic acid (FOLVITE) 1 MG tablet Take 1 mg by  mouth daily.   Yes Historical Provider, MD  loratadine (CLARITIN) 10 MG tablet Take 10 mg by mouth daily.    Yes Historical Provider, MD  montelukast (SINGULAIR) 10 MG tablet Take 10 mg by mouth at bedtime.  04/12/15  Yes Historical Provider, MD  pantoprazole (PROTONIX) 40 MG tablet Take 40 mg by mouth at bedtime.    Yes Historical Provider, MD  telmisartan-hydrochlorothiazide (MICARDIS HCT) 80-12.5 MG per tablet Take 1 tablet by mouth daily.  10/09/12  Yes Historical Provider, MD  hydrocortisone (PROCTOZONE-HC) 2.5 % rectal cream Place 1 application rectally 2 (two) times daily. Patient not taking: Reported on 09/20/2015 08/21/15   Mahala Menghini, PA-C     Allergies:  Allergies  Allergen Reactions  . Flexeril [Cyclobenzaprine] Anaphylaxis  . Other Anaphylaxis    ALL TREE NUTS  . Keflex [Cephalexin]     ITCH  . Aspirin     Cannot take due to ulcers  . Statins Other (See Comments)    is pain and swelling   . Adhesive [Tape] Rash  . Chlorhexidine Rash    Social History:   reports that she quit smoking about 23 years ago. Her smoking use included Cigarettes. She has a 80 pack-year smoking history. She has never used smokeless tobacco. She reports that she does not drink alcohol or use  illicit drugs.  Family History: Family History  Problem Relation Age of Onset  . Breast cancer Sister   . Colon cancer Sister     two deceased, one living and terminal, one current undergoing treatment, ages 24, 67, 71, 76     Physical Exam: Filed Vitals:   09/23/15 0457 09/23/15 0735 09/23/15 1359 09/23/15 1547  BP: 102/62   117/65  Pulse: 76   64  Temp: 98 F (36.7 C)   98.1 F (36.7 C)  TempSrc: Oral     Resp: 18   18  Height:      Weight:      SpO2: 98% 100% 98% 100%   Blood pressure 117/65, pulse 64, temperature 98.1 F (36.7 C), temperature source Oral, resp. rate 18, height 5' (1.524 m), weight 91.717 kg (202 lb 3.2 oz), SpO2 100 %.  GEN:  Pleasant  patient lying in the stretcher in  no acute distress; cooperative with exam. PSYCH:  alert and oriented x4; does not appear anxious or depressed; affect is appropriate. HEENT: Mucous membranes pink and anicteric; PERRLA; EOM intact; no cervical lymphadenopathy nor thyromegaly or carotid bruit; no JVD; There were no stridor. Neck is very supple. Breasts:: Not examined CHEST WALL: No tenderness CHEST: Normal respiration, Clearer but still has moderate wheezing.  HEART: Regular rate and rhythm.  There are no murmur, rub, or gallops.   BACK: No kyphosis or scoliosis; no CVA tenderness ABDOMEN: soft and non-tender; no masses, no organomegaly, normal abdominal bowel sounds; no pannus; no intertriginous candida. There is no rebound and no distention. Rectal Exam: Not done EXTREMITIES: No bone or joint deformity; age-appropriate arthropathy of the hands and knees; no edema; no ulcerations.  There is no calf tenderness. Genitalia: not examined PULSES: 2+ and symmetric SKIN: Normal hydration no rash or ulceration CNS: Cranial nerves 2-12 grossly intact no focal lateralizing neurologic deficit.  Speech is fluent; uvula elevated with phonation, facial symmetry and tongue midline. DTR are normal bilaterally, cerebella exam is intact, barbinski is negative and strengths are equaled bilaterally.  No sensory loss.   Labs on Admission:  Basic Metabolic Panel:  Recent Labs Lab 09/20/15 1900 09/21/15 0626  NA 138 134*  K 4.0 4.0  CL 104 102  CO2 24 21*  GLUCOSE 120* 177*  BUN 13 12  CREATININE 1.25* 1.29*  CALCIUM 9.0 8.9   Liver Function Tests:  Recent Labs Lab 09/20/15 1900  AST 31  ALT 26  ALKPHOS 82  BILITOT 0.4  PROT 7.5  ALBUMIN 4.3   CBC:  Recent Labs Lab 09/20/15 1900 09/21/15 0626  WBC 10.6* 6.3  NEUTROABS 4.0  --   HGB 11.9* 10.6*  HCT 37.8 35.0*  MCV 78.6 78.8  PLT 245 202    Radiological Exams on Admission: Ct Chest W Contrast  09/22/2015  CLINICAL DATA:  Shortness of breath and mid chest tightness  for 3 weeks, cough, history COPD, hypertension, former smoker, asthma EXAM: CT CHEST WITH CONTRAST TECHNIQUE: Multidetector CT imaging of the chest was performed during intravenous contrast administration. Sagittal and coronal MPR images reconstructed from axial data set. CONTRAST:  43mL ISOVUE-300 IOPAMIDOL (ISOVUE-300) INJECTION 61% IV COMPARISON:  02/08/2011 FINDINGS: Cardiovascular: Mild atherosclerotic calcification at aortic arch. Aorta normal caliber. Mediastinum/Lymph Nodes: Esophagus unremarkable. Normal size mediastinal lymph nodes. No thoracic adenopathy. Lungs/Pleura: Ground-glass opacity in medial RIGHT middle lobe. Remaining lungs clear. No infiltrate, pleural effusion or pneumothorax. Upper abdomen: Visualized upper abdomen unremarkable Musculoskeletal: Diffuse osseous demineralization. RIGHT shoulder prosthesis. No  significant focal osseous abnormalities. IMPRESSION: Ground-glass opacity in medial RIGHT middle lobe question, nonspecific, could represent infection though other causes of non solid opacity including non solid tumor are not excluded. Initial follow-up with CT at 6-12 months is recommended to assess resolution versus persistence. If persistent, repeat CT is recommended every 2 years until 5 years of stability has been established. This recommendation follows the consensus statement: Guidelines for Management of Incidental Pulmonary Nodules Detected on CT Images:From the Fleischner Society 2017; published online before print (10.1148/radiol.SG:5268862). Electronically Signed   By: Lavonia Dana M.D.   On: 09/22/2015 11:42    EKG: Independently reviewed.   Assessment/Plan Present on Admission:  . Anxiety . GERD (gastroesophageal reflux disease) . HTN (hypertension) . Acute respiratory failure ([place]) . CKD (chronic kidney disease) stage 3, GFR 30-59 ml/min . Asthma exacerbation  PLAN:  COPD exacerbation: Slow to respond, but is doing better. Will continue with IV steroids, neb  and oral antibiotics. I am not convinced she has PNA. She has mild diastolic CHF, and will continue with low dose lasix.  Will add Dulera to her regimen.  Give some antitussive.    HTN: Controlled.   CKD: Stable. Cr 1.3.   GERD: Can aggravate her asthma. Being treated with PPI.   Other plans as per orders. Code Status: FULL Haskel Khan, MD.  FACP [facility] Pager [phone] 7pm to 7am.  09/23/2015, 7:36 PM

## 2015-09-24 DIAGNOSIS — J45901 Unspecified asthma with (acute) exacerbation: Secondary | ICD-10-CM

## 2015-09-24 DIAGNOSIS — N183 Chronic kidney disease, stage 3 (moderate): Secondary | ICD-10-CM | POA: Diagnosis not present

## 2015-09-24 DIAGNOSIS — J9600 Acute respiratory failure, unspecified whether with hypoxia or hypercapnia: Secondary | ICD-10-CM

## 2015-09-24 DIAGNOSIS — K219 Gastro-esophageal reflux disease without esophagitis: Secondary | ICD-10-CM | POA: Diagnosis not present

## 2015-09-24 DIAGNOSIS — I10 Essential (primary) hypertension: Secondary | ICD-10-CM | POA: Diagnosis not present

## 2015-09-24 MED ORDER — ALUM & MAG HYDROXIDE-SIMETH 200-200-20 MG/5ML PO SUSP
30.0000 mL | ORAL | Status: DC | PRN
  Administered 2015-09-24: 30 mL via ORAL
  Filled 2015-09-24: qty 30

## 2015-09-24 NOTE — Care Management Important Message (Signed)
Important Message  Patient Details  Name: KIYAN SIWEK MRN: [id number] Date of Birth: [birth year]   Medicare Important Message Given:  Yes    Talor Desrosiers, Chauncey Reading, RN 09/24/2015, 8:09 AM

## 2015-09-24 NOTE — Progress Notes (Signed)
SATURATION QUALIFICATIONS: (This note is used to comply with regulatory documentation for home oxygen)  Patient Saturations on Room Air at Rest = 99%  Patient Saturations on Room Air while Ambulating = 97%  Patient Saturations on 2 Liters of oxygen while Ambulating =100%  Please briefly explain why patient needs home oxygen: 

## 2015-09-24 NOTE — Progress Notes (Signed)
PROGRESS NOTE    Glenda Terry  [id number] DOB: [birth year] DOA: 09/20/2015 PCP: Wende Neighbors, MD  Outpatient Specialists:   Brief Narrative:  52 yof with a Hx of COPD/asthma, obesity, HTN, and GERD presented with complaints of SOB. She was admitted for COPD excerebration and started on IV Doxy, IVF, IV steroids and nebs. CT with contrast revealed possible infiltrate on the RML, and ECHO showed only grade I diastolic dysfx.She was started on IV lasix as it was felt that she likely has some acute on chronic diastolic CHF. Improvement noted with IV lasix and treatments.    Assessment & Plan:   Principal Problem:   Acute respiratory failure (HCC) Active Problems:   Anxiety   HTN (hypertension)   GERD (gastroesophageal reflux disease)   Asthma exacerbation   CKD (chronic kidney disease) stage 3, GFR 30-59 ml/min   1. COPD exacerbation: Slowly improving. Will continue IV steroids, neb and oral abx.  2. Acute resp failure with hypoxia. Related to #1. Appears to be improving. Will evaluate for home oxygen. 3. Acute on Chronic Diastolic CHF: Will continue lowe-dose lasix. Echo as below. Volume status appears to be stable. 4. HTN: Stable. Continue current medications.   5. CKD stage 3 : Stable. Continue to monitor in the setting of diuresis. 6. GERD: Continue PPI.  7. Anxiety: Continue Xanax.   DVT prophylaxis: Lovenox Code Status: Full  Family Communication: discussed with daughter over the phone Disposition Plan: Discharge home when improved   Consultants:   None   Procedures:   ECHO  - Left ventricle: The cavity size was normal. Wall thickness was  normal. Systolic function was normal. The estimated ejection  fraction was in the range of 55% to 60%. Doppler parameters are  consistent with abnormal left ventricular relaxation (grade 1  diastolic dysfunction).  Antimicrobials:   Doxycycline 7/9>>   Subjective: Overall feeling better. Still has cough. Wheezing  improving.  Objective: Filed Vitals:   09/23/15 1953 09/23/15 2001 09/23/15 2219 09/24/15 0556  BP:   110/45 113/55  Pulse:   62 93  Temp:   98 F (36.7 C) 98.3 F (36.8 C)  TempSrc:   Oral Oral  Resp:   18 20  Height:      Weight:      SpO2: 98% 100% 99% 100%    Intake/Output Summary (Last 24 hours) at 09/24/15 0757 Last data filed at 09/24/15 0600  Gross per 24 hour  Intake   1080 ml  Output   4000 ml  Net  -2920 ml   Filed Weights   09/20/15 2347  Weight: 91.717 kg (202 lb 3.2 oz)    Examination:  General exam: Appears calm and comfortable  Respiratory system: occasional wheezes. Respiratory effort normal. Cardiovascular system: S1 & S2 heard, RRR. No JVD, murmurs, rubs, gallops or clicks. No pedal edema. Gastrointestinal system: Abdomen is nondistended, soft and nontender. No organomegaly or masses felt. Normal bowel sounds heard. Central nervous system: Alert and oriented. No focal neurological deficits. Extremities: Symmetric 5 x 5 power. Skin: No rashes, lesions or ulcers Psychiatry: Judgement and insight appear normal. Mood & affect appropriate.     Data Reviewed: I have personally reviewed following labs and imaging studies  CBC:  Recent Labs Lab 09/20/15 1900 09/21/15 0626  WBC 10.6* 6.3  NEUTROABS 4.0  --   HGB 11.9* 10.6*  HCT 37.8 35.0*  MCV 78.6 78.8  PLT 245 [id number]   Basic Metabolic Panel:  Recent Labs Lab 09/20/15 1900  09/21/15 0626  NA 138 134*  K 4.0 4.0  CL 104 102  CO2 24 21*  GLUCOSE 120* 177*  BUN 13 12  CREATININE 1.25* 1.29*  CALCIUM 9.0 8.9   GFR: Estimated Creatinine Clearance: 39.2 mL/min (by C-G formula based on Cr of 1.29). Liver Function Tests:  Recent Labs Lab 09/20/15 1900  AST 31  ALT 26  ALKPHOS 82  BILITOT 0.4  PROT 7.5  ALBUMIN 4.3   No results for input(s): LIPASE, AMYLASE in the last 168 hours. No results for input(s): AMMONIA in the last 168 hours. Coagulation Profile: No results for input(s):  INR, PROTIME in the last 168 hours. Cardiac Enzymes: No results for input(s): CKTOTAL, CKMB, CKMBINDEX, TROPONINI in the last 168 hours. BNP (last 3 results) No results for input(s): PROBNP in the last 8760 hours. HbA1C: No results for input(s): HGBA1C in the last 72 hours. CBG: No results for input(s): GLUCAP in the last 168 hours. Lipid Profile: No results for input(s): CHOL, HDL, LDLCALC, TRIG, CHOLHDL, LDLDIRECT in the last 72 hours. Thyroid Function Tests: No results for input(s): TSH, T4TOTAL, FREET4, T3FREE, THYROIDAB in the last 72 hours. Anemia Panel: No results for input(s): VITAMINB12, FOLATE, FERRITIN, TIBC, IRON, RETICCTPCT in the last 72 hours. Urine analysis:    Component Value Date/Time   COLORURINE YELLOW 05/05/2015 [place] 05/05/2015 1650   LABSPEC 1.015 05/05/2015 1650   PHURINE 6.0 05/05/2015 1650   GLUCOSEU NEGATIVE 05/05/2015 1650   HGBUR NEGATIVE 05/05/2015 1650   BILIRUBINUR NEGATIVE 05/05/2015 1650   KETONESUR NEGATIVE 05/05/2015 1650   PROTEINUR NEGATIVE 05/05/2015 1650   UROBILINOGEN 0.2 03/03/2014 1324   NITRITE NEGATIVE 05/05/2015 1650   LEUKOCYTESUR NEGATIVE 05/05/2015 1650   Sepsis Labs: @LABRCNTIP (procalcitonin:4,lacticidven:4)  )No results found for this or any previous visit (from the past 240 hour(s)).       Radiology Studies: Ct Chest W Contrast  09/22/2015  CLINICAL DATA:  Shortness of breath and mid chest tightness for 3 weeks, cough, history COPD, hypertension, former smoker, asthma EXAM: CT CHEST WITH CONTRAST TECHNIQUE: Multidetector CT imaging of the chest was performed during intravenous contrast administration. Sagittal and coronal MPR images reconstructed from axial data set. CONTRAST:  60mL ISOVUE-300 IOPAMIDOL (ISOVUE-300) INJECTION 61% IV COMPARISON:  02/08/2011 FINDINGS: Cardiovascular: Mild atherosclerotic calcification at aortic arch. Aorta normal caliber. Mediastinum/Lymph Nodes: Esophagus unremarkable.  Normal size mediastinal lymph nodes. No thoracic adenopathy. Lungs/Pleura: Ground-glass opacity in medial RIGHT middle lobe. Remaining lungs clear. No infiltrate, pleural effusion or pneumothorax. Upper abdomen: Visualized upper abdomen unremarkable Musculoskeletal: Diffuse osseous demineralization. RIGHT shoulder prosthesis. No significant focal osseous abnormalities. IMPRESSION: Ground-glass opacity in medial RIGHT middle lobe question, nonspecific, could represent infection though other causes of non solid opacity including non solid tumor are not excluded. Initial follow-up with CT at 6-12 months is recommended to assess resolution versus persistence. If persistent, repeat CT is recommended every 2 years until 5 years of stability has been established. This recommendation follows the consensus statement: Guidelines for Management of Incidental Pulmonary Nodules Detected on CT Images:From the Fleischner Society 2017; published online before print (10.1148/radiol.IJ:2314499). Electronically Signed   By: Lavonia Dana M.D.   On: 09/22/2015 11:42        Scheduled Meds: . docusate sodium  100 mg Oral BID  . doxycycline  100 mg Oral Q12H  . enoxaparin (LOVENOX) injection  40 mg Subcutaneous Q24H  . ferrous sulfate  325 mg Oral Q breakfast  . folic acid  1 mg  Oral Daily  . furosemide  40 mg Intravenous Daily  . irbesartan  300 mg Oral Daily   And  . hydrochlorothiazide  12.5 mg Oral Daily  . ipratropium-albuterol  3 mL Nebulization Q6H  . loratadine  10 mg Oral Daily  . methylPREDNISolone (SOLU-MEDROL) injection  60 mg Intravenous Q6H  . mometasone-formoterol  2 puff Inhalation BID  . montelukast  10 mg Oral QHS  . pantoprazole  40 mg Oral QHS  . senna  1 tablet Oral BID  . sodium chloride  1,000 mL Intravenous Once   Continuous Infusions:    LOS: 3 days    Time spent: 25mins    Rennis Harding, MD [facility] Pager [phone]  If 7PM-7AM, please contact  night-coverage [email or web address] Password [facility] 09/24/2015, 7:57 AM

## 2015-09-25 DIAGNOSIS — K219 Gastro-esophageal reflux disease without esophagitis: Secondary | ICD-10-CM | POA: Diagnosis not present

## 2015-09-25 DIAGNOSIS — I10 Essential (primary) hypertension: Secondary | ICD-10-CM | POA: Diagnosis not present

## 2015-09-25 DIAGNOSIS — J45901 Unspecified asthma with (acute) exacerbation: Secondary | ICD-10-CM | POA: Diagnosis not present

## 2015-09-25 DIAGNOSIS — J9600 Acute respiratory failure, unspecified whether with hypoxia or hypercapnia: Secondary | ICD-10-CM | POA: Diagnosis not present

## 2015-09-25 DIAGNOSIS — N183 Chronic kidney disease, stage 3 (moderate): Secondary | ICD-10-CM | POA: Diagnosis not present

## 2015-09-25 MED ORDER — PREDNISONE 10 MG PO TABS: ORAL_TABLET | ORAL | Status: DC

## 2015-09-25 MED ORDER — GUAIFENESIN ER 600 MG PO TB12: 600.0000 mg | ORAL_TABLET | Freq: Two times a day (BID) | ORAL | Status: DC

## 2015-09-25 MED ORDER — DOXYCYCLINE HYCLATE 100 MG PO TABS: 100.0000 mg | ORAL_TABLET | Freq: Two times a day (BID) | ORAL | Status: DC

## 2015-09-25 MED ORDER — ALBUTEROL SULFATE (2.5 MG/3ML) 0.083% IN NEBU: 2.5000 mg | INHALATION_SOLUTION | RESPIRATORY_TRACT | Status: DC | PRN

## 2015-09-25 MED ORDER — FUROSEMIDE 40 MG PO TABS: 40.0000 mg | ORAL_TABLET | Freq: Every day | ORAL | Status: DC | PRN

## 2015-09-25 NOTE — Discharge Summary (Signed)
Physician Discharge Summary  Glenda Terry [id number] DOB: 1941-01-13 DOA: 09/20/2015  PCP: Wende Neighbors, MD  Admit date: 09/20/2015 Discharge date: 09/25/2015  Admitted From: Home  Disposition:   Home  Recommendations for Outpatient Follow-up:  1. Follow up with PCP in 1-2 weeks 2. Please obtain BMP/CBC in one week 3. Repeat ct scan of chest in 3-4 weeks to ensure resolution of PNA   Home Health: Home  Equipment/Devices: None  Discharge Condition: Stable CODE STATUS: Full Diet recommendation: Heart healthy   Brief/Interim Summary: 43 yof with a Hx of COPD/asthma, obesity, HTN, and GERD presented with complaints of SOB. She was admitted for COPD excerebration and started on IV Doxy, IVF, IV steroids and nebs. CT with contrast revealed possible infiltrate on the RML, and ECHO showed only grade I diastolic dysfx.She was started on IV lasix as it was felt that she likely has some acute on chronic diastolic CHF. She was admitted for further treatment.   Discharge Diagnoses:  Principal Problem:   Acute respiratory failure ([place]) Active Problems:   Anxiety   HTN (hypertension)   GERD (gastroesophageal reflux disease)   Asthma exacerbation   CKD (chronic kidney disease) stage 3, GFR 30-59 ml/min  Patient was admitted for complaints of shortness of breath that were found to be due to COPD/ asthma exacerbation. On admission started on IV steroids, nebs, and oral abx. She was also started on low-dose lasix, since it was felt she likely has some acute on chronic diastolic CHF. ECHO as below. Breathing and volume status noted to improve. Wheezing has resolved and respiratory status seems to be approaching baseline. Patient was able to ambulate on room air without difficulty and did not require supplemental oxygen. On discharge instructed to complete steroid taper, nebs, and course of oral abx. She was also instructed to continue low-dose lasix PRN.  1. Acute resp failure with hypoxia. Related  to #1. Appears to be improving. Breathing comfortably on room air.  2. Essential HTN. Stable. Continue home medications.  3. CKD Stage III. Stable in the setting of diuresis.   4. GERD. Continued on PPI 5. Anxiety. Xanax PRN    Discharge Instructions      Discharge Instructions    AMB Referral to [facility]    Complete by:  As directed   Please refer to [facility] for transition of care program calls and restart [facility] program services. Patient active with [facility] in the past.  Active consent in electronic record. For questions or concerns contact: Royetta Crochet. Laymond Purser, RN, BSN, [place] [phone]) Business Cell  [phone]) Toll Free Office  Reason for consult:  [facility] active  Diagnoses of:  COPD/ Pneumonia  Expected date of contact:  1-3 days (reserved for hospital discharges)     Diet - low sodium heart healthy    Complete by:  As directed      Face-to-face encounter (required for Medicare/Medicaid patients)    Complete by:  As directed   I MEMON,JEHANZEB certify that this patient is under my care and that I, or a nurse practitioner or physician's assistant working with me, had a face-to-face encounter that meets the physician face-to-face encounter requirements with this patient on 09/25/2015. The encounter with the patient was in whole, or in part for the following medical condition(s) which is the primary reason for home health care (List medical condition): copd exacerbation  The encounter with the patient was in whole, or in part, for  the following medical condition, which is the primary reason for home health care:  copd exacerbation  I certify that, based on my findings, the following services are medically necessary home health services:  Nursing  Reason for Medically Necessary Home Health Services:  Skilled Nursing- Teaching of Disease Process/Symptom Management  My clinical findings support the need for the above  services:  Shortness of breath with activity  Further, I certify that my clinical findings support that this patient is homebound due to:  Shortness of Breath with activity     Home Health    Complete by:  As directed   To provide the following care/treatments:  RN     Increase activity slowly    Complete by:  As directed             Medication List    STOP taking these medications        albuterol 0.63 MG/3ML nebulizer solution  Commonly known as:  ACCUNEB  Replaced by:  albuterol (2.5 MG/3ML) 0.083% nebulizer solution  You also have another medication with the same name that you need to continue taking as instructed.      TAKE these medications        ALPRAZolam 0.5 MG tablet  Commonly known as:  XANAX  Take 0.5 mg by mouth 4 (four) times daily as needed for anxiety. For anxiety     budesonide-formoterol 160-4.5 MCG/ACT inhaler  Commonly known as:  SYMBICORT  Inhale 2 puffs into the lungs 2 (two) times daily.     doxycycline 100 MG tablet  Commonly known as:  VIBRA-TABS  Take 1 tablet (100 mg total) by mouth every 12 (twelve) hours.     EPIPEN 2-PAK 0.3 mg/0.3 mL Devi  Generic drug:  EPINEPHrine  Inject 0.3 mg into the muscle once. Reported on 08/21/2015     ferrous sulfate 325 (65 FE) MG tablet  Take 325 mg by mouth daily with breakfast.     folic acid 1 MG tablet  Commonly known as:  FOLVITE  Take 1 mg by mouth daily.     furosemide 40 MG tablet  Commonly known as:  LASIX  Take 1 tablet (40 mg total) by mouth daily as needed.     guaiFENesin 600 MG 12 hr tablet  Commonly known as:  MUCINEX  Take 1 tablet (600 mg total) by mouth 2 (two) times daily.     hydrocortisone 2.5 % rectal cream  Commonly known as:  PROCTOZONE-HC  Place 1 application rectally 2 (two) times daily.     loratadine 10 MG tablet  Commonly known as:  CLARITIN  Take 10 mg by mouth daily.     montelukast 10 MG tablet  Commonly known as:  SINGULAIR  Take 10 mg by mouth at bedtime.      pantoprazole 40 MG tablet  Commonly known as:  PROTONIX  Take 40 mg by mouth at bedtime.     predniSONE 10 MG tablet  Commonly known as:  DELTASONE  Take 40mg  po daily for 2 days then 30mg  daily for 2 days then 20mg  daily for 2 days then 10mg  daily for 2 days then stop     PROAIR HFA 108 (90 Base) MCG/ACT inhaler  Generic drug:  albuterol  Inhale 2 puffs into the lungs every 6 (six) hours as needed for wheezing or shortness of breath. For COPD     albuterol (2.5 MG/3ML) 0.083% nebulizer solution  Commonly known as:  PROVENTIL  Take 3  mLs (2.5 mg total) by nebulization every 4 (four) hours as needed for wheezing or shortness of breath.     telmisartan-hydrochlorothiazide 80-12.5 MG tablet  Commonly known as:  MICARDIS HCT  Take 1 tablet by mouth daily.       Follow-up Information    Follow up with Wende Neighbors, MD. Schedule an appointment as soon as possible for a visit in 2 weeks.   Specialty:  Internal Medicine   Why:  you will need repeat CT scan of chest in 3-4 weeks to ensure resolution of pneumonia   Contact information:   [place] [address] [phone]      Allergies  Allergen Reactions  . Flexeril [Cyclobenzaprine] Anaphylaxis  . Other Anaphylaxis    ALL TREE NUTS  . Keflex [Cephalexin]     ITCH  . Aspirin     Cannot take due to ulcers  . Statins Other (See Comments)    is pain and swelling   . Adhesive [Tape] Rash  . Chlorhexidine Rash    Consultations:  None   Procedures/Studies: Ct Chest W Contrast  09/22/2015  CLINICAL DATA:  Shortness of breath and mid chest tightness for 3 weeks, cough, history COPD, hypertension, former smoker, asthma EXAM: CT CHEST WITH CONTRAST TECHNIQUE: Multidetector CT imaging of the chest was performed during intravenous contrast administration. Sagittal and coronal MPR images reconstructed from axial data set. CONTRAST:  66mL ISOVUE-300 IOPAMIDOL (ISOVUE-300) INJECTION 61% IV COMPARISON:  02/08/2011  FINDINGS: Cardiovascular: Mild atherosclerotic calcification at aortic arch. Aorta normal caliber. Mediastinum/Lymph Nodes: Esophagus unremarkable. Normal size mediastinal lymph nodes. No thoracic adenopathy. Lungs/Pleura: Ground-glass opacity in medial RIGHT middle lobe. Remaining lungs clear. No infiltrate, pleural effusion or pneumothorax. Upper abdomen: Visualized upper abdomen unremarkable Musculoskeletal: Diffuse osseous demineralization. RIGHT shoulder prosthesis. No significant focal osseous abnormalities. IMPRESSION: Ground-glass opacity in medial RIGHT middle lobe question, nonspecific, could represent infection though other causes of non solid opacity including non solid tumor are not excluded. Initial follow-up with CT at 6-12 months is recommended to assess resolution versus persistence. If persistent, repeat CT is recommended every 2 years until 5 years of stability has been established. This recommendation follows the consensus statement: Guidelines for Management of Incidental Pulmonary Nodules Detected on CT Images:From the Fleischner Society 2017; published online before print (10.1148/radiol.IJ:2314499). Electronically Signed   By: Lavonia Dana M.D.   On: 09/22/2015 11:42   Dg Chest Portable 1 View  09/20/2015  CLINICAL DATA:  Shortness of breath, difficulty breathing, asthma attack, cough EXAM: PORTABLE CHEST 1 VIEW COMPARISON:  06/21/2015 FINDINGS: Lungs are clear.  No pleural effusion or pneumothorax. The heart is normal in size. Right shoulder arthroplasty. Degenerative changes of the left shoulder. IMPRESSION: No evidence of acute cardiopulmonary disease. Electronically Signed   By: Julian Hy M.D.   On: 09/20/2015 19:20    ECHO - Left ventricle: The cavity size was normal. Wall thickness was  normal. Systolic function was normal. The estimated ejection  fraction was in the range of 55% to 60%. Doppler parameters are  consistent with abnormal left ventricular relaxation  (grade 1  diastolic dysfunction).   Subjective: Doing well. Complains of cough with yellow sputum. States she is still wheezing but this is chronic. Denies SOB while ambulating.   Discharge Exam: Filed Vitals:   09/24/15 2146 09/25/15 0428  BP: 110/62 125/52  Pulse: 69 63  Temp: 97.9 F (36.6 C) 97.7 F (36.5 C)  Resp: 20 18   Filed Vitals:  09/24/15 2146 09/25/15 0428 09/25/15 0758 09/25/15 0805  BP: 110/62 125/52    Pulse: 69 63    Temp: 97.9 F (36.6 C) 97.7 F (36.5 C)    TempSrc: Oral Oral    Resp: 20 18    Height:      Weight:      SpO2: 96% 97% 94% 100%    General: Pt is alert, awake, not in acute distress Cardiovascular: RRR, S1/S2 +, no rubs, no gallops Respiratory: CTA bilaterally, no wheezing, no rhonchi Abdominal: Soft, NT, ND, bowel sounds + Extremities: no edema, no cyanosis    The results of significant diagnostics from this hospitalization (including imaging, microbiology, ancillary and laboratory) are listed below for reference.     Microbiology: No results found for this or any previous visit (from the past 240 hour(s)).   Labs: BNP (last 3 results) No results for input(s): BNP in the last 8760 hours. Basic Metabolic Panel:  Recent Labs Lab 09/20/15 1900 09/21/15 0626  NA 138 134*  K 4.0 4.0  CL 104 102  CO2 24 21*  GLUCOSE 120* 177*  BUN 13 12  CREATININE 1.25* 1.29*  CALCIUM 9.0 8.9   Liver Function Tests:  Recent Labs Lab 09/20/15 1900  AST 31  ALT 26  ALKPHOS 82  BILITOT 0.4  PROT 7.5  ALBUMIN 4.3   No results for input(s): LIPASE, AMYLASE in the last 168 hours. No results for input(s): AMMONIA in the last 168 hours. CBC:  Recent Labs Lab 09/20/15 1900 09/21/15 0626  WBC 10.6* 6.3  NEUTROABS 4.0  --   HGB 11.9* 10.6*  HCT 37.8 35.0*  MCV 78.6 78.8  PLT 245 202   Cardiac Enzymes: No results for input(s): CKTOTAL, CKMB, CKMBINDEX, TROPONINI in the last 168 hours. BNP: Invalid input(s): POCBNP CBG: No  results for input(s): GLUCAP in the last 168 hours. D-Dimer No results for input(s): DDIMER in the last 72 hours. Hgb A1c No results for input(s): HGBA1C in the last 72 hours. Lipid Profile No results for input(s): CHOL, HDL, LDLCALC, TRIG, CHOLHDL, LDLDIRECT in the last 72 hours. Thyroid function studies No results for input(s): TSH, T4TOTAL, T3FREE, THYROIDAB in the last 72 hours.  Invalid input(s): FREET3 Anemia work up No results for input(s): VITAMINB12, FOLATE, FERRITIN, TIBC, IRON, RETICCTPCT in the last 72 hours. Urinalysis    Component Value Date/Time   COLORURINE YELLOW 05/05/2015 1650   APPEARANCEUR CLEAR 05/05/2015 1650   LABSPEC 1.015 05/05/2015 1650   PHURINE 6.0 05/05/2015 1650   GLUCOSEU NEGATIVE 05/05/2015 1650   HGBUR NEGATIVE 05/05/2015 1650   BILIRUBINUR NEGATIVE 05/05/2015 1650   KETONESUR NEGATIVE 05/05/2015 1650   PROTEINUR NEGATIVE 05/05/2015 1650   UROBILINOGEN 0.2 03/03/2014 1324   NITRITE NEGATIVE 05/05/2015 1650   LEUKOCYTESUR NEGATIVE 05/05/2015 1650   Sepsis Labs Invalid input(s): PROCALCITONIN,  WBC,  LACTICIDVEN Microbiology No results found for this or any previous visit (from the past 240 hour(s)).   Time coordinating discharge: Over 30 minutes  SIGNED:  Kathie Dike, MD  [facility] 09/25/2015, 11:21 AM If 7PM-7AM, please contact night-coverage [email or web address] Password [email or web address]    By signing my name below, I, Rennis Harding, attest that this documentation has been prepared under the direction and in the presence of Kathie Dike, MD. Electronically signed: Rennis Harding, Scribe. 09/25/2015 10:55am   I, Dr. Kathie Dike, personally performed the services described in this documentaiton. All medical record entries made by the scribe were at my direction and in my presence. I  have reviewed the chart and agree that the record reflects my personal performance and is accurate and complete  Kathie Dike, MD, 09/25/2015 11:21  AM

## 2015-09-26 ENCOUNTER — Encounter: Payer: Self-pay | Admitting: *Deleted

## 2015-09-26 ENCOUNTER — Other Ambulatory Visit: Payer: Self-pay | Admitting: *Deleted

## 2015-09-26 DIAGNOSIS — R7301 Impaired fasting glucose: Secondary | ICD-10-CM | POA: Diagnosis not present

## 2015-09-26 DIAGNOSIS — E782 Mixed hyperlipidemia: Secondary | ICD-10-CM | POA: Diagnosis not present

## 2015-09-26 NOTE — Patient Outreach (Signed)
09/26/15- Telephone call to pt for transition of care week 1, pt in hospital 09/20/15-09/25/15 for acute respiratory failure, spoke with pt, HIPAA verified, pt reports her daughter assists her with picking up prescriptions from pharmacy and is to get antibitioc, lasix and proventil this afternoon.  Pt states she talked with MD and is not to take lasix until after she has bloodwork next week.  Pt states she is tired "and has been through an ordeal"  Pt agreeable to weekly transition of care calls.  RN CM faxed transition of care note and barrier letter to Dr. Wende Neighbors.  THN CM Care Plan Problem One        Most Recent Value   Care Plan Problem One  Knowledge deficit related to COPD   Role Documenting the Problem One  Care Management Coordinator   Care Plan for Problem One  Active   THN Long Term Goal (31-90 days)  Pt will have better understanding of disease process COPD within 90 days   THN Long Term Goal Start Date  09/26/15   Interventions for Problem One Long Term Goal  RN CM reviewed COPD action plan and importance of taking all medications as prescribed, RN CM reviewed medication list over the phone.   [facility]N CM Short Term Goal #1 (0-30 days)  pt will have no hospital readmissions within 30 days   [facility]N CM Short Term Goal #1 Start Date  09/26/15   Interventions for Short Term Goal #1  RN CM reviewed pt is to have bloodwork next week and to see primary MD on 10/13/15, reviewed discharge summary with pt.     Outpatient Encounter Prescriptions as of 09/26/2015  Medication Sig Note  . albuterol (PROAIR HFA) 108 (90 BASE) MCG/ACT inhaler Inhale 2 puffs into the lungs every 6 (six) hours as needed for wheezing or shortness of breath. For COPD   . albuterol (PROVENTIL) (2.5 MG/3ML) 0.083% nebulizer solution Take 3 mLs (2.5 mg total) by nebulization every 4 (four) hours as needed for wheezing or shortness of breath.   . ALPRAZolam (XANAX) 0.5 MG tablet Take 0.5 mg by mouth 4 (four) times daily as needed for  anxiety. For anxiety 05/05/2015: 1/2 tablet only today  . budesonide-formoterol (SYMBICORT) 160-4.5 MCG/ACT inhaler Inhale 2 puffs into the lungs 2 (two) times daily.   Marland Kitchen doxycycline (VIBRA-TABS) 100 MG tablet Take 1 tablet (100 mg total) by mouth every 12 (twelve) hours.   . ferrous sulfate 325 (65 FE) MG tablet Take 325 mg by mouth daily with breakfast.   . folic acid (FOLVITE) 1 MG tablet Take 1 mg by mouth daily.   Marland Kitchen loratadine (CLARITIN) 10 MG tablet Take 10 mg by mouth daily.    . montelukast (SINGULAIR) 10 MG tablet Take 10 mg by mouth at bedtime.  05/05/2015: Received from: External Pharmacy  . pantoprazole (PROTONIX) 40 MG tablet Take 40 mg by mouth at bedtime.    . predniSONE (DELTASONE) 10 MG tablet Take 40mg  po daily for 2 days then 30mg  daily for 2 days then 20mg  daily for 2 days then 10mg  daily for 2 days then stop   . telmisartan-hydrochlorothiazide (MICARDIS HCT) 80-12.5 MG per tablet Take 1 tablet by mouth daily.    Marland Kitchen EPINEPHrine (EPIPEN 2-PAK) 0.3 mg/0.3 mL DEVI Inject 0.3 mg into the muscle once. Reported on 09/26/2015 06/21/2014: This medication is for PRN usage. Patient has not had to use and states she has available if needed. She keeps medication updated when it expires.  Marland Kitchen  furosemide (LASIX) 40 MG tablet Take 1 tablet (40 mg total) by mouth daily as needed. (Patient not taking: Reported on 09/26/2015) 09/26/2015: Not taking until further instructed by MD (per pt)  . guaiFENesin (MUCINEX) 600 MG 12 hr tablet Take 1 tablet (600 mg total) by mouth 2 (two) times daily. (Patient not taking: Reported on 09/26/2015)   . hydrocortisone (PROCTOZONE-HC) 2.5 % rectal cream Place 1 application rectally 2 (two) times daily. (Patient not taking: Reported on 09/20/2015)    No facility-administered encounter medications on file as of 09/26/2015.    PLAN See pt for initial home visit 10/03/15  Jacqlyn Larsen [facility] [place] Coordinator [phone]

## 2015-10-01 DIAGNOSIS — I10 Essential (primary) hypertension: Secondary | ICD-10-CM | POA: Diagnosis not present

## 2015-10-01 DIAGNOSIS — E669 Obesity, unspecified: Secondary | ICD-10-CM | POA: Diagnosis not present

## 2015-10-01 DIAGNOSIS — R944 Abnormal results of kidney function studies: Secondary | ICD-10-CM | POA: Diagnosis not present

## 2015-10-01 DIAGNOSIS — J4551 Severe persistent asthma with (acute) exacerbation: Secondary | ICD-10-CM | POA: Diagnosis not present

## 2015-10-01 DIAGNOSIS — R7301 Impaired fasting glucose: Secondary | ICD-10-CM | POA: Diagnosis not present

## 2015-10-01 DIAGNOSIS — M4800 Spinal stenosis, site unspecified: Secondary | ICD-10-CM | POA: Diagnosis not present

## 2015-10-01 DIAGNOSIS — M25561 Pain in right knee: Secondary | ICD-10-CM | POA: Diagnosis not present

## 2015-10-01 DIAGNOSIS — F419 Anxiety disorder, unspecified: Secondary | ICD-10-CM | POA: Diagnosis not present

## 2015-10-01 DIAGNOSIS — D509 Iron deficiency anemia, unspecified: Secondary | ICD-10-CM | POA: Diagnosis not present

## 2015-10-03 ENCOUNTER — Encounter: Payer: Self-pay | Admitting: *Deleted

## 2015-10-03 ENCOUNTER — Other Ambulatory Visit: Payer: Self-pay | Admitting: *Deleted

## 2015-10-03 NOTE — Patient Outreach (Signed)
[facility]) Care Management   10/03/2015  Glenda Terry November 21, 1940 [id number]  Glenda Terry is an 75 y.o. female  Subjective: Routine home visit with pt, HIPAA verified, pt reports she lives alone in apartment, daughters live nearby and call daily and assist pt as needed.  Pt still drives, is very willing and interested in learning.  Pt states she oversees her own medications and prefilled med box would be helpful.  Pt states the asthma has been difficult to manage with dyspnea and fatigue at times.  Objective:   Filed Vitals:   10/03/15 1251  BP: 118/62  Pulse: 104  Resp: 20  Height: 1.524 m (5')  Weight: 205 lb (92.987 kg)  SpO2: 98%   ROS  Physical Exam  Constitutional: She is oriented to person, place, and time. She appears well-developed and well-nourished.  HENT:  Head: Normocephalic.  Neck: Normal range of motion. Neck supple.  Cardiovascular:  HR 104 Irregular  Respiratory:  Dyspnea with exertion bil breath sounds slightly diminished  GI: Soft. Bowel sounds are normal.  Musculoskeletal: Normal range of motion. She exhibits edema.  Edema at both ankles only  Neurological: She is alert and oriented to person, place, and time.  Skin: Skin is warm and dry.  Psychiatric: She has a normal mood and affect. Her behavior is normal. Judgment and thought content normal.    Encounter Medications:   Outpatient Encounter Prescriptions as of 10/03/2015  Medication Sig Note  . albuterol (PROAIR HFA) 108 (90 BASE) MCG/ACT inhaler Inhale 2 puffs into the lungs every 6 (six) hours as needed for wheezing or shortness of breath. For COPD   . albuterol (PROVENTIL) (2.5 MG/3ML) 0.083% nebulizer solution Take 3 mLs (2.5 mg total) by nebulization every 4 (four) hours as needed for wheezing or shortness of breath.   . ALPRAZolam (XANAX) 0.5 MG tablet Take 0.5 mg by mouth 4 (four) times daily as needed for anxiety. For anxiety 05/05/2015: 1/2 tablet only today   . budesonide-formoterol (SYMBICORT) 160-4.5 MCG/ACT inhaler Inhale 2 puffs into the lungs 2 (two) times daily.   . cetirizine (ZYRTEC) 10 MG tablet Take 10 mg by mouth daily.   Marland Kitchen EPINEPHrine (EPIPEN 2-PAK) 0.3 mg/0.3 mL DEVI Inject 0.3 mg into the muscle once. Reported on 09/26/2015 06/21/2014: This medication is for PRN usage. Patient has not had to use and states she has available if needed. She keeps medication updated when it expires.  . ferrous sulfate 325 (65 FE) MG tablet Take 325 mg by mouth daily with breakfast.   . folic acid (FOLVITE) 1 MG tablet Take 1 mg by mouth daily.   . montelukast (SINGULAIR) 10 MG tablet Take 10 mg by mouth at bedtime.  05/05/2015: Received from: External Pharmacy  . pantoprazole (PROTONIX) 40 MG tablet Take 40 mg by mouth at bedtime.    Marland Kitchen telmisartan-hydrochlorothiazide (MICARDIS HCT) 80-12.5 MG per tablet Take 1 tablet by mouth daily.    Marland Kitchen guaiFENesin (MUCINEX) 600 MG 12 hr tablet Take 1 tablet (600 mg total) by mouth 2 (two) times daily. (Patient not taking: Reported on 09/26/2015)   . hydrocortisone (PROCTOZONE-HC) 2.5 % rectal cream Place 1 application rectally 2 (two) times daily. (Patient not taking: Reported on 09/20/2015)   . [DISCONTINUED] doxycycline (VIBRA-TABS) 100 MG tablet Take 1 tablet (100 mg total) by mouth every 12 (twelve) hours.   . [DISCONTINUED] furosemide (LASIX) 40 MG tablet Take 1 tablet (40 mg total) by mouth daily as needed. (Patient not taking:  Reported on 09/26/2015) 09/26/2015: Not taking until further instructed by MD (per pt)  . [DISCONTINUED] loratadine (CLARITIN) 10 MG tablet Take 10 mg by mouth daily.    . [DISCONTINUED] predniSONE (DELTASONE) 10 MG tablet Take 40mg  po daily for 2 days then 30mg  daily for 2 days then 20mg  daily for 2 days then 10mg  daily for 2 days then stop    No facility-administered encounter medications on file as of 10/03/2015.    Functional Status:   In your present state of health, do you have any difficulty  performing the following activities: 10/03/2015 09/20/2015  Hearing? N N  Vision? N N  Difficulty concentrating or making decisions? N N  Walking or climbing stairs? Y N  Dressing or bathing? N N  Doing errands, shopping? Y N  Preparing Food and eating ? N -  Using the Toilet? N -  In the past six months, have you accidently leaked urine? N -  Do you have problems with loss of bowel control? N -  Managing your Medications? N -  Managing your Finances? N -  Housekeeping or managing your Housekeeping? N -    Fall/Depression Screening:    PHQ 2/9 Scores 10/03/2015 04/21/2015 04/14/2015 02/10/2015 01/27/2015 01/23/2015 06/27/2014  PHQ - 2 Score 1 2 2 2 2  0 0  PHQ- 9 Score - 6 5 6 6  - -   Fall Risk  10/03/2015 04/21/2015 04/14/2015 02/10/2015 01/27/2015  Falls in the past year? No Yes Yes No No  Number falls in past yr: - 2 or more 2 or more - -  Injury with Fall? - No No - -  Risk Factor Category  - High Fall Risk High Fall Risk - -  Risk for fall due to : Medication side effect History of fall(s);Impaired balance/gait History of fall(s);Impaired balance/gait Medication side effect Medication side effect  Follow up - Falls prevention discussed Falls prevention discussed - -   Assessment:  Rn CM faxed initial home visit and barrier letter to primary MD Dr. Wende Neighbors, RN CM focused on asthma management, environmental triggers and action plan.  THN CM Care Plan Problem One        Most Recent Value   Care Plan Problem One  Knowledge deficit related to Asthma   Role Documenting the Problem One  Care Management Coordinator   Care Plan for Problem One  Active   THN Long Term Goal (31-90 days)  Pt will have better understanding of disease process COPD within 90 days   THN Long Term Goal Start Date  09/26/15   Interventions for Problem One Long Term Goal  Rn CM reiterated action plan, reviewed EMMI handouts related to asthma.   THN CM Short Term Goal #1 (0-30 days)  pt will have no hospital  readmissions within 30 days   THN CM Short Term Goal #1 Start Date  09/26/15   Interventions for Short Term Goal #1  RN CM observed all medications and reviewed with pt, purpose and side effects, gave pt prefilled med box for better organization Pt saw Dr. [name] Crane primary care on 10/01/15.   THN CM Short Term Goal #2 (0-30 days)  Pt will practice deep breathing and coughing regularly within 30 days.   THN CM Short Term Goal #2 Start Date  10/03/15   Interventions for Short Term Goal #2  Rn CM reviewed deep breathing and coughing, reviewed using peak flow meter, reviewed correct way to use inhalers and pt demonstrated correctly.  Plan: continue weekly transition of care calls Follow up with home visit 10/30/15  Jacqlyn Larsen [facility], [place] Coordinator [phone]

## 2015-10-08 ENCOUNTER — Other Ambulatory Visit: Payer: Self-pay | Admitting: *Deleted

## 2015-10-08 NOTE — Patient Outreach (Signed)
10/08/15- Telephone call to pt for transition of care week 3, spoke with pt, HIPAA verified, pt reports "I'm improving"  Reports "breathing better", pt states she has all medications, no new concerns or issues reported.  THN CM Care Plan Problem One   Flowsheet Row Most Recent Value  Care Plan Problem One  Knowledge deficit related to Asthma  Role Documenting the Problem One  Care Management Coordinator  Care Plan for Problem One  Active  THN Long Term Goal (31-90 days)  Pt will have better understanding of disease process COPD within 90 days  THN Long Term Goal Start Date  09/26/15  Interventions for Problem One Long Term Goal  RN CM reinforced action plan  THN CM Short Term Goal #1 (0-30 days)  pt will have no hospital readmissions within 30 days  THN CM Short Term Goal #1 Start Date  09/26/15  Interventions for Short Term Goal #1  RN CM reviewed importance of taking all medications as prescribed, pt states she has all medications on hand and taking as prescribed  THN CM Short Term Goal #2 (0-30 days)  Pt will practice deep breathing and coughing regularly within 30 days.  THN CM Short Term Goal #2 Start Date  10/03/15  Interventions for Short Term Goal #2  RN CM reminded pt to deep breathe and cough several times per day      PLAN Continue weekly transition of care calls  Jacqlyn Larsen [facility], [place] Coordinator [phone]

## 2015-10-09 DIAGNOSIS — R252 Cramp and spasm: Secondary | ICD-10-CM | POA: Diagnosis not present

## 2015-10-15 ENCOUNTER — Other Ambulatory Visit (HOSPITAL_COMMUNITY): Payer: Self-pay | Admitting: Internal Medicine

## 2015-10-15 ENCOUNTER — Ambulatory Visit (HOSPITAL_COMMUNITY)
Admission: RE | Admit: 2015-10-15 | Discharge: 2015-10-15 | Disposition: A | Discharge status: Home / Self Care | Payer: PPO | Source: Ambulatory Visit | Attending: Internal Medicine | Admitting: Internal Medicine

## 2015-10-15 ENCOUNTER — Other Ambulatory Visit: Payer: Self-pay | Admitting: *Deleted

## 2015-10-15 DIAGNOSIS — Z09 Encounter for follow-up examination after completed treatment for conditions other than malignant neoplasm: Secondary | ICD-10-CM | POA: Insufficient documentation

## 2015-10-15 DIAGNOSIS — R918 Other nonspecific abnormal finding of lung field: Secondary | ICD-10-CM | POA: Insufficient documentation

## 2015-10-15 DIAGNOSIS — R062 Wheezing: Secondary | ICD-10-CM | POA: Diagnosis not present

## 2015-10-15 DIAGNOSIS — I10 Essential (primary) hypertension: Secondary | ICD-10-CM | POA: Diagnosis not present

## 2015-10-15 DIAGNOSIS — F411 Generalized anxiety disorder: Secondary | ICD-10-CM | POA: Diagnosis not present

## 2015-10-15 DIAGNOSIS — J4551 Severe persistent asthma with (acute) exacerbation: Secondary | ICD-10-CM | POA: Diagnosis not present

## 2015-10-15 DIAGNOSIS — R944 Abnormal results of kidney function studies: Secondary | ICD-10-CM | POA: Diagnosis not present

## 2015-10-15 DIAGNOSIS — Z8701 Personal history of pneumonia (recurrent): Secondary | ICD-10-CM | POA: Diagnosis not present

## 2015-10-15 DIAGNOSIS — R531 Weakness: Secondary | ICD-10-CM | POA: Diagnosis not present

## 2015-10-15 DIAGNOSIS — R51 Headache: Secondary | ICD-10-CM | POA: Diagnosis not present

## 2015-10-15 DIAGNOSIS — J189 Pneumonia, unspecified organism: Secondary | ICD-10-CM | POA: Diagnosis not present

## 2015-10-15 DIAGNOSIS — E6609 Other obesity due to excess calories: Secondary | ICD-10-CM | POA: Diagnosis not present

## 2015-10-15 NOTE — Patient Outreach (Signed)
10/15/15- Telephone call to pt for transition of care week 4, no answer to home telephone and no option to leave voicemail, called pt cell phone and no answer and no option to leave voicemail.  PLAN See pt for home visit 10/30/15  Jacqlyn Larsen [facility], [place] Coordinator [phone]

## 2015-10-20 DIAGNOSIS — R944 Abnormal results of kidney function studies: Secondary | ICD-10-CM | POA: Diagnosis not present

## 2015-10-20 DIAGNOSIS — I10 Essential (primary) hypertension: Secondary | ICD-10-CM | POA: Diagnosis not present

## 2015-10-20 DIAGNOSIS — F064 Anxiety disorder due to known physiological condition: Secondary | ICD-10-CM | POA: Diagnosis not present

## 2015-10-20 DIAGNOSIS — D509 Iron deficiency anemia, unspecified: Secondary | ICD-10-CM | POA: Diagnosis not present

## 2015-10-20 DIAGNOSIS — R000 Tachycardia, unspecified: Secondary | ICD-10-CM | POA: Diagnosis not present

## 2015-10-20 DIAGNOSIS — R7301 Impaired fasting glucose: Secondary | ICD-10-CM | POA: Diagnosis not present

## 2015-10-20 DIAGNOSIS — J4552 Severe persistent asthma with status asthmaticus: Secondary | ICD-10-CM | POA: Diagnosis not present

## 2015-10-21 ENCOUNTER — Other Ambulatory Visit: Payer: Self-pay | Admitting: *Deleted

## 2015-10-21 NOTE — Patient Outreach (Signed)
10/21/15- Telephone call to pt for follow up- pt called 24 hour nurse line on 10/18/15 for weakness.  Spoke with pt, HIPAA verified, pt states " I've had weakness in my whole body"  Pt reports she saw primary MD yesterday and had EKG, urinalysis, bloodwork and is awaiting on remainder of results and states " but so far they haven't found anything wrong"  Pt reports she has all medications and taking as prescribed, no further issues reported.  RN CM reminded pt of resources to call (24 hour nurse line, RN CM, MD, 911) pt verbalizes understanding.  Jacqlyn Larsen [facility], [place] Coordinator [phone]

## 2015-10-30 ENCOUNTER — Encounter: Payer: Self-pay | Admitting: *Deleted

## 2015-10-30 ENCOUNTER — Other Ambulatory Visit: Payer: Self-pay | Admitting: *Deleted

## 2015-10-30 DIAGNOSIS — H1013 Acute atopic conjunctivitis, bilateral: Secondary | ICD-10-CM | POA: Diagnosis not present

## 2015-10-30 DIAGNOSIS — H538 Other visual disturbances: Secondary | ICD-10-CM | POA: Diagnosis not present

## 2015-10-30 NOTE — Patient Outreach (Signed)
Glenda Terry) Care Management   10/30/2015  Glenda Terry June 28, 1940 [id number]  Glenda Terry is an 75 y.o. female  Subjective: Routine home visit with pt, HIPAA verified, pt reports " my back is in bad shape, I can't stand this pain"  Pt reports while she was on steroids she was able to manage pain better but not on steroids now and does not want to take steroids long term, pt states she is not candidate for surgery due to her breathing issues.  Pt states she has appointment with primary care MD next week and asthma clinic at [facility] on 12/05/15.  Objective:   Vitals:   10/30/15 1343 10/30/15 1344  BP:  116/68  Pulse:  84  Resp: 20 20  SpO2:  94%   ROS  Physical Exam  Constitutional: She is oriented to person, place, and time. She appears well-developed and well-nourished.  HENT:  Head: Normocephalic.  Neck: Normal range of motion. Neck supple.  Cardiovascular: Normal rate and regular rhythm.   Respiratory: Breath sounds normal.  Dyspnea with exertion  GI: Soft. Bowel sounds are normal.  Musculoskeletal: Normal range of motion.  Pt has edema at ankles (chronic condition per pt)  Neurological: She is alert and oriented to person, place, and time.  Skin: Skin is warm and dry.  Psychiatric: She has a normal mood and affect. Her behavior is normal. Judgment and thought content normal.    Encounter Medications:   Outpatient Encounter Prescriptions as of 10/30/2015  Medication Sig Note  . albuterol (PROAIR HFA) 108 (90 BASE) MCG/ACT inhaler Inhale 2 puffs into the lungs every 6 (six) hours as needed for wheezing or shortness of breath. For COPD   . albuterol (PROVENTIL) (2.5 MG/3ML) 0.083% nebulizer solution Take 3 mLs (2.5 mg total) by nebulization every 4 (four) hours as needed for wheezing or shortness of breath.   . ALPRAZolam (XANAX) 0.5 MG tablet Take 0.5 mg by mouth 4 (four) times daily as needed for anxiety. For anxiety 05/05/2015: 1/2 tablet only  today  . budesonide-formoterol (SYMBICORT) 160-4.5 MCG/ACT inhaler Inhale 2 puffs into the lungs 2 (two) times daily.   . cetirizine (ZYRTEC) 10 MG tablet Take 10 mg by mouth daily.   Marland Kitchen EPINEPHrine (EPIPEN 2-PAK) 0.3 mg/0.3 mL DEVI Inject 0.3 mg into the muscle once. Reported on 09/26/2015 06/21/2014: This medication is for PRN usage. Patient has not had to use and states she has available if needed. She keeps medication updated when it expires.  . ferrous sulfate 325 (65 FE) MG tablet Take 325 mg by mouth daily with breakfast.   . folic acid (FOLVITE) 1 MG tablet Take 1 mg by mouth daily.   . hydrocortisone (PROCTOZONE-HC) 2.5 % rectal cream Place 1 application rectally 2 (two) times daily.   . montelukast (SINGULAIR) 10 MG tablet Take 10 mg by mouth at bedtime.  05/05/2015: Received from: External Pharmacy  . pantoprazole (PROTONIX) 40 MG tablet Take 40 mg by mouth at bedtime.    Marland Kitchen telmisartan (MICARDIS) 80 MG tablet Take 80 mg by mouth daily.   Marland Kitchen guaiFENesin (MUCINEX) 600 MG 12 hr tablet Take 1 tablet (600 mg total) by mouth 2 (two) times daily. (Patient not taking: Reported on 10/30/2015)   . [DISCONTINUED] telmisartan-hydrochlorothiazide (MICARDIS HCT) 80-12.5 MG per tablet Take 1 tablet by mouth daily.     No facility-administered encounter medications on file as of 10/30/2015.     Functional Status:   In your present state of  health, do you have any difficulty performing the following activities: 10/03/2015 09/20/2015  Hearing? N N  Vision? N N  Difficulty concentrating or making decisions? N N  Walking or climbing stairs? Y N  Dressing or bathing? N N  Doing errands, shopping? Y N  Preparing Food and eating ? N -  Using the Toilet? N -  In the past six months, have you accidently leaked urine? N -  Do you have problems with loss of bowel control? N -  Managing your Medications? N -  Managing your Finances? N -  Housekeeping or managing your Housekeeping? N -  Some recent data might be  hidden    Fall/Depression Screening:    PHQ 2/9 Scores 10/03/2015 04/21/2015 04/14/2015 02/10/2015 01/27/2015 01/23/2015 06/27/2014  PHQ - 2 Score 1 2 2 2 2  0 0  PHQ- 9 Score - 6 5 6 6  - -    Assessment:  RN CM faxed note to primary MD Dr. [name] Crane stating pt is requesting to be referred to pain clinic as she feels her back pain is unmanageable, reported pt rates pain 10. Pt states she will also be discussing this with MD at visit next week.  RN CM reminded pt not to go outside in extreme heat and to do her walking early in morning or late in evening.  RN CM reviewed relaxation techniques for pain and using heat or ice for 30 minute intervals and reviewed precautions so pt will not burn herself or cause skin injury with ice.  [facility] CM Care Plan Problem One   Flowsheet Row Most Recent Value  Care Plan Problem One  Knowledge deficit related to Asthma  Role Documenting the Problem One  Care Management Coordinator  Care Plan for Problem One  Active  THN Long Term Goal (31-90 days)  Pt will have better understanding of disease process asthma within 90 days  THN Long Term Goal Start Date  09/26/15  Interventions for Problem One Long Term Goal  RN CM reiterated action plan and symptom management  THN CM Short Term Goal #1 (0-30 days)  pt will have no [facility] readmissions within 30 days  THN CM Short Term Goal #1 Start Date  09/26/15  [facility] CM Short Term Goal #1 Met Date  10/30/15  Interventions for Short Term Goal #1  RN CM reviewed medications with pt.  THN CM Short Term Goal #2 (0-30 days)  Pt will practice deep breathing and coughing regularly within 30 days.  THN CM Short Term Goal #2 Start Date  10/30/15 [goal restarted]  Interventions for Short Term Goal #2  RN CM reminded pt of benefits of deep breathing and coughing, relaxation    THN CM Care Plan Problem Two   Flowsheet Row Most Recent Value  Care Plan Problem Two  Difficulty managing pain  Role Documenting the Problem Two  Care Management  Coordinator  Care Plan for Problem Two  Active  THN CM Short Term Goal #1 (0-30 days)  pt will have referral in place to pain clinic within 30 days  THN CM Short Term Goal #1 Start Date  10/30/15  Interventions for Short Term Goal #2   RN CM faxed letter ot primary MD Dr. [name] Crane and reported back pain rated at 40 and pt feels is unmanageable and getting worse, ask if MD can make referral to pain clinic per pt request      Plan: follow up with home visit 11/26/15 Follow up on referral to pain  clinic   Jacqlyn Larsen [facility], [place] Coordinator [phone]

## 2015-11-05 DIAGNOSIS — I10 Essential (primary) hypertension: Secondary | ICD-10-CM | POA: Diagnosis not present

## 2015-11-05 DIAGNOSIS — R944 Abnormal results of kidney function studies: Secondary | ICD-10-CM | POA: Diagnosis not present

## 2015-11-05 DIAGNOSIS — D509 Iron deficiency anemia, unspecified: Secondary | ICD-10-CM | POA: Diagnosis not present

## 2015-11-05 DIAGNOSIS — E6609 Other obesity due to excess calories: Secondary | ICD-10-CM | POA: Diagnosis not present

## 2015-11-05 DIAGNOSIS — M545 Low back pain: Secondary | ICD-10-CM | POA: Diagnosis not present

## 2015-11-24 DIAGNOSIS — I10 Essential (primary) hypertension: Secondary | ICD-10-CM | POA: Diagnosis not present

## 2015-11-24 DIAGNOSIS — R7989 Other specified abnormal findings of blood chemistry: Secondary | ICD-10-CM | POA: Diagnosis not present

## 2015-11-26 ENCOUNTER — Other Ambulatory Visit: Payer: Self-pay | Admitting: *Deleted

## 2015-11-26 ENCOUNTER — Encounter: Payer: Self-pay | Admitting: *Deleted

## 2015-11-26 NOTE — Patient Outreach (Addendum)
[facility]) Care Management   11/26/2015  Glenda Terry [birth year] [id number]  Glenda Terry is an 75 y.o. female  Subjective: Routine home visit with pt, HIPAA verified, pt reports she has been checking blood pressure with wrist cuff and she had a few elevated readings (systolic 326-712'W range and diastolic 58'K) went to Dr. [name] Crane on Monday and is now back on telmisartan/ HCTZ 80/12.5 (HCTZ added on). Pt reports blood pressure has improved since this medication added.  Pt states pain clinic called her today, referral was sent by primary MD and pt awaiting decision as to whether pain clinic will accept her.  Objective:   Vitals:   11/26/15 1239  BP: 124/74  Pulse: 88  Resp: 18  SpO2: 97%   ROS  Physical Exam  Constitutional: She is oriented to person, place, and time. She appears well-developed and well-nourished.  HENT:  Head: Normocephalic.  Neck: Normal range of motion. Neck supple.  Cardiovascular: Normal rate and regular rhythm.   Respiratory: Effort normal.  Dyspnea with exertion  GI: Soft. Bowel sounds are normal.  Musculoskeletal: Normal range of motion.  Neurological: She is alert and oriented to person, place, and time.  Skin: Skin is warm and dry.  Psychiatric: She has a normal mood and affect. Her behavior is normal. Judgment and thought content normal.    Encounter Medications:   Outpatient Encounter Prescriptions as of 11/26/2015  Medication Sig Note  . albuterol (PROAIR HFA) 108 (90 BASE) MCG/ACT inhaler Inhale 2 puffs into the lungs every 6 (six) hours as needed for wheezing or shortness of breath. For COPD   . albuterol (PROVENTIL) (2.5 MG/3ML) 0.083% nebulizer solution Take 3 mLs (2.5 mg total) by nebulization every 4 (four) hours as needed for wheezing or shortness of breath.   . ALPRAZolam (XANAX) 0.5 MG tablet Take 0.5 mg by mouth 4 (four) times daily as needed for anxiety. For anxiety 05/05/2015: 1/2 tablet only today  .  budesonide-formoterol (SYMBICORT) 160-4.5 MCG/ACT inhaler Inhale 2 puffs into the lungs 2 (two) times daily.   . cetirizine (ZYRTEC) 10 MG tablet Take 10 mg by mouth daily.   Marland Kitchen EPINEPHrine (EPIPEN 2-PAK) 0.3 mg/0.3 mL DEVI Inject 0.3 mg into the muscle once. Reported on 09/26/2015 06/21/2014: This medication is for PRN usage. Patient has not had to use and states she has available if needed. She keeps medication updated when it expires.  . ferrous sulfate 325 (65 FE) MG tablet Take 325 mg by mouth daily with breakfast.   . folic acid (FOLVITE) 1 MG tablet Take 1 mg by mouth daily.   Marland Kitchen guaiFENesin (MUCINEX) 600 MG 12 hr tablet Take 1 tablet (600 mg total) by mouth 2 (two) times daily.   . hydrocortisone (PROCTOZONE-HC) 2.5 % rectal cream Place 1 application rectally 2 (two) times daily.   . montelukast (SINGULAIR) 10 MG tablet Take 10 mg by mouth at bedtime.  05/05/2015: Received from: External Pharmacy  . pantoprazole (PROTONIX) 40 MG tablet Take 40 mg by mouth at bedtime.    Marland Kitchen telmisartan-hydrochlorothiazide (MICARDIS HCT) 80-12.5 MG tablet Take 1 tablet by mouth daily.   . [DISCONTINUED] telmisartan (MICARDIS) 80 MG tablet Take 80 mg by mouth daily.    No facility-administered encounter medications on file as of 11/26/2015.     Functional Status:   In your present state of health, do you have any difficulty performing the following activities: 10/03/2015 09/20/2015  Hearing? N N  Vision? N N  Difficulty concentrating  or making decisions? N N  Walking or climbing stairs? Y N  Dressing or bathing? N N  Doing errands, shopping? Y N  Preparing Food and eating ? N -  Using the Toilet? N -  In the past six months, have you accidently leaked urine? N -  Do you have problems with loss of bowel control? N -  Managing your Medications? N -  Managing your Finances? N -  Housekeeping or managing your Housekeeping? N -  Some recent data might be hidden    Fall/Depression Screening:    PHQ 2/9 Scores  10/03/2015 04/21/2015 04/14/2015 02/10/2015 01/27/2015 01/23/2015 06/27/2014  PHQ - 2 Score 1 2 2 2 2  0 0  PHQ- 9 Score - 6 5 6 6  - -    Assessment:  Pt doing well in the home, RN CM talked with pt about discharge plan and pt feels she could benefit from one more community visit since blood pressure medication has been changed and pt to continue checking blood pressure and keeping a log, pt hopefully will be active patient at pain clinic.  THN CM Care Plan Problem One   Flowsheet Row Most Recent Value  Care Plan Problem One  Knowledge deficit related to Asthma  Role Documenting the Problem One  Care Management Coordinator  Care Plan for Problem One  Active  THN Long Term Goal (31-90 days)  Pt will have better understanding of disease process asthma within 90 days  THN Long Term Goal Start Date  09/26/15  Interventions for Problem One Long Term Goal  RN CM reinforced action plan and symptom management  THN CM Short Term Goal #1 (0-30 days)  pt will have no hospital readmissions within 30 days  THN CM Short Term Goal #1 Start Date  09/26/15  THN CM Short Term Goal #1 Met Date  10/30/15  THN CM Short Term Goal #2 (0-30 days)  Pt will practice deep breathing and coughing regularly within 30 days.  THN CM Short Term Goal #2 Start Date  10/30/15 [goal restarted]  [facility] CM Short Term Goal #2 Met Date  11/26/15  Interventions for Short Term Goal #2  RN CM reviewed with pt of benefits of deep breathing and coughing, relaxation    THN CM Care Plan Problem Two   Flowsheet Row Most Recent Value  Care Plan Problem Two  Difficulty managing pain  Role Documenting the Problem Two  Care Management Coordinator  Care Plan for Problem Two  Active  THN CM Short Term Goal #1 (0-30 days)  pt will have referral in place to pain clinic within 30 days  THN CM Short Term Goal #1 Start Date  10/30/15  [facility] CM Short Term Goal #1 Met Date   11/26/15 Barrie Folk met]  Interventions for Short Term Goal #2   RN CM spoke with pt  about referral to pain clinic- pt states they contacted her today and are considering her case    [facility] CM Care Plan Problem Three   Flowsheet Row Most Recent Value  Care Plan Problem Three  Issues with elevated blood pressure  Role Documenting the Problem Three  Care Management Coordinator  Care Plan for Problem Three  Active  THN CM Short Term Goal #1 (0-30 days)  Pt will check blood pressure daily and record  THN CM Short Term Goal #1 Start Date  11/26/15  Interventions for Short Term Goal #1  RN CM ask pt to continue checking blood pressure daily and record,  RN CM reviewed new blood pressure medication, u      Plan: follow up with home visit 12/23/15 Assess blood pressure log Plan to discharge or transfer to RN health coach Follow up on whether pain clinic accepted pt  Jacqlyn Larsen [facility], [place] Coordinator [phone]

## 2015-12-05 DIAGNOSIS — J441 Chronic obstructive pulmonary disease with (acute) exacerbation: Secondary | ICD-10-CM | POA: Diagnosis not present

## 2015-12-05 DIAGNOSIS — Z87891 Personal history of nicotine dependence: Secondary | ICD-10-CM | POA: Diagnosis not present

## 2015-12-05 DIAGNOSIS — J301 Allergic rhinitis due to pollen: Secondary | ICD-10-CM | POA: Diagnosis not present

## 2015-12-09 DIAGNOSIS — M545 Low back pain: Secondary | ICD-10-CM | POA: Diagnosis not present

## 2015-12-09 DIAGNOSIS — M5136 Other intervertebral disc degeneration, lumbar region: Secondary | ICD-10-CM | POA: Diagnosis not present

## 2015-12-09 DIAGNOSIS — Z79891 Long term (current) use of opiate analgesic: Secondary | ICD-10-CM | POA: Diagnosis not present

## 2015-12-09 DIAGNOSIS — G894 Chronic pain syndrome: Secondary | ICD-10-CM | POA: Diagnosis not present

## 2015-12-15 DIAGNOSIS — J45998 Other asthma: Secondary | ICD-10-CM | POA: Diagnosis not present

## 2015-12-15 DIAGNOSIS — R944 Abnormal results of kidney function studies: Secondary | ICD-10-CM | POA: Diagnosis not present

## 2015-12-15 DIAGNOSIS — G8929 Other chronic pain: Secondary | ICD-10-CM | POA: Diagnosis not present

## 2015-12-15 DIAGNOSIS — I10 Essential (primary) hypertension: Secondary | ICD-10-CM | POA: Diagnosis not present

## 2015-12-16 DIAGNOSIS — J449 Chronic obstructive pulmonary disease, unspecified: Secondary | ICD-10-CM | POA: Insufficient documentation

## 2015-12-16 HISTORY — DX: Chronic obstructive pulmonary disease, unspecified: J44.9

## 2015-12-18 DIAGNOSIS — J411 Mucopurulent chronic bronchitis: Secondary | ICD-10-CM | POA: Diagnosis not present

## 2015-12-18 DIAGNOSIS — J309 Allergic rhinitis, unspecified: Secondary | ICD-10-CM | POA: Diagnosis not present

## 2015-12-18 DIAGNOSIS — N183 Chronic kidney disease, stage 3 (moderate): Secondary | ICD-10-CM | POA: Diagnosis not present

## 2015-12-18 DIAGNOSIS — Z87891 Personal history of nicotine dependence: Secondary | ICD-10-CM | POA: Diagnosis not present

## 2015-12-18 DIAGNOSIS — J301 Allergic rhinitis due to pollen: Secondary | ICD-10-CM | POA: Diagnosis not present

## 2015-12-18 DIAGNOSIS — Z79899 Other long term (current) drug therapy: Secondary | ICD-10-CM | POA: Diagnosis not present

## 2015-12-18 DIAGNOSIS — I129 Hypertensive chronic kidney disease with stage 1 through stage 4 chronic kidney disease, or unspecified chronic kidney disease: Secondary | ICD-10-CM | POA: Diagnosis not present

## 2015-12-18 DIAGNOSIS — J449 Chronic obstructive pulmonary disease, unspecified: Secondary | ICD-10-CM | POA: Diagnosis not present

## 2015-12-18 DIAGNOSIS — Z7951 Long term (current) use of inhaled steroids: Secondary | ICD-10-CM | POA: Diagnosis not present

## 2015-12-18 DIAGNOSIS — R0602 Shortness of breath: Secondary | ICD-10-CM | POA: Diagnosis not present

## 2015-12-18 DIAGNOSIS — J441 Chronic obstructive pulmonary disease with (acute) exacerbation: Secondary | ICD-10-CM | POA: Diagnosis not present

## 2015-12-23 ENCOUNTER — Encounter: Payer: Self-pay | Admitting: *Deleted

## 2015-12-23 ENCOUNTER — Other Ambulatory Visit: Payer: Self-pay | Admitting: *Deleted

## 2015-12-23 NOTE — Patient Outreach (Signed)
Glenda Terry) Care Management   12/23/2015  Glenda Terry Feb 21, 1941 [id number]  Glenda Terry is an 75 y.o. female  Subjective: Routine home visit with pt, HIPAA verified, pt reports she went to pain clinic "and was there for 7 hours and I'm not going back"  Pt reports "it's just too many things you have to do and that's not the place for me"  Pt saw pulmonary MD in [facility] and is on new medications- taking azithromycin daily "to help with thick mucus, not as an antibiotic for infection, this is ongoing daily" per pt.  Pt using Aerobika flutter valve and states " it's really helping"  Pt received flu and pneumonia vaccine.  Pt reports blood pressure "has been good".  Objective:   Vitals:   12/23/15 1222  BP: 110/64  Pulse: 70  Resp: 18  SpO2: 95%   ROS  Physical Exam  Constitutional: She is oriented to person, place, and time. She appears well-developed and well-nourished.  HENT:  Head: Normocephalic.  Neck: Normal range of motion. Neck supple.  Cardiovascular: Normal rate and regular rhythm.   Respiratory:  dypsnea with exertion  GI: Soft. Bowel sounds are normal.  Musculoskeletal: Normal range of motion. She exhibits no edema.  Neurological: She is alert and oriented to person, place, and time.  Skin: Skin is warm and dry.  Psychiatric: She has a normal mood and affect. Her behavior is normal. Judgment and thought content normal.    Encounter Medications:   Outpatient Encounter Prescriptions as of 12/23/2015  Medication Sig Note  . albuterol (PROAIR HFA) 108 (90 BASE) MCG/ACT inhaler Inhale 2 puffs into the lungs every 6 (six) hours as needed for wheezing or shortness of breath. For COPD   . ALPRAZolam (XANAX) 0.5 MG tablet Take 0.5 mg by mouth 4 (four) times daily as needed for anxiety. For anxiety 05/05/2015: 1/2 tablet only today  . azithromycin (ZITHROMAX) 250 MG tablet Take by mouth daily.   . budesonide-formoterol (SYMBICORT) 160-4.5  MCG/ACT inhaler Inhale 2 puffs into the lungs 2 (two) times daily.   . cetirizine (ZYRTEC) 10 MG tablet Take 10 mg by mouth daily.   Marland Kitchen EPINEPHrine (EPIPEN 2-PAK) 0.3 mg/0.3 mL DEVI Inject 0.3 mg into the muscle once. Reported on 09/26/2015 06/21/2014: This medication is for PRN usage. Patient has not had to use and states she has available if needed. She keeps medication updated when it expires.  . ferrous sulfate 325 (65 FE) MG tablet Take 325 mg by mouth daily with breakfast.   . fluticasone (FLONASE) 50 MCG/ACT nasal spray Place 1 spray into both nostrils 2 (two) times daily.   . folic acid (FOLVITE) 1 MG tablet Take 1 mg by mouth daily.   . hydrocortisone (PROCTOZONE-HC) 2.5 % rectal cream Place 1 application rectally 2 (two) times daily.   Marland Kitchen ipratropium-albuterol (DUONEB) 0.5-2.5 (3) MG/3ML SOLN Take 3 mLs by nebulization every 6 (six) hours as needed.   . montelukast (SINGULAIR) 10 MG tablet Take 10 mg by mouth at bedtime.  05/05/2015: Received from: External Pharmacy  . pantoprazole (PROTONIX) 40 MG tablet Take 40 mg by mouth at bedtime.    Marland Kitchen telmisartan-hydrochlorothiazide (MICARDIS HCT) 80-12.5 MG tablet Take 1 tablet by mouth daily.   Marland Kitchen albuterol (PROVENTIL) (2.5 MG/3ML) 0.083% nebulizer solution Take 3 mLs (2.5 mg total) by nebulization every 4 (four) hours as needed for wheezing or shortness of breath. (Patient not taking: Reported on 12/23/2015)   . guaiFENesin (MUCINEX) 600  MG 12 hr tablet Take 1 tablet (600 mg total) by mouth 2 (two) times daily. (Patient not taking: Reported on 12/23/2015)    No facility-administered encounter medications on file as of 12/23/2015.     Functional Status:   In your present state of health, do you have any difficulty performing the following activities: 10/03/2015 09/20/2015  Hearing? N N  Vision? N N  Difficulty concentrating or making decisions? N N  Walking or climbing stairs? Y N  Dressing or bathing? N N  Doing errands, shopping? Y N  Preparing  Food and eating ? N -  Using the Toilet? N -  In the past six months, have you accidently leaked urine? N -  Do you have problems with loss of bowel control? N -  Managing your Medications? N -  Managing your Finances? N -  Housekeeping or managing your Housekeeping? N -  Some recent data might be hidden    Fall/Depression Screening:    PHQ 2/9 Scores 10/03/2015 04/21/2015 04/14/2015 02/10/2015 01/27/2015 01/23/2015 06/27/2014  PHQ - 2 Score _0 0 0  PHQ- 9 Score - _1 - -    Assessment:  RN CM discussed discharge plan with pt and will discharge from community today. Patient blood pressure readings have been within normal limits with systolic in 557-322'G range, diastolic 25'K range.  RN CM reviewed medications with emphasis on new medications (purpose and side effects) pt has new medication for nebulizer, medication profile updated.  Pt feels she is doing better, is planning to go back to [facility] to exercise.  Pt is using ice for back pain and capsaicin cream intermittently and states relief obtained. Pt verbalizes understanding of calling MD for change in health status/ symptoms.  Rn CM faxed case closure letter to primary MD Dr. [name] Crane and mailed case closure letter to patient's home.  THN CM Care Plan Problem One   Flowsheet Row Most Recent Value  Care Plan Problem One  Knowledge deficit related to Asthma  Role Documenting the Problem One  Care Management Coordinator  Care Plan for Problem One  Active  THN Long Term Goal (31-90 days)  Pt will have better understanding of disease process asthma within 90 days  THN Long Term Goal Start Date  09/26/15  THN Long Term Goal Met Date  12/23/15  Interventions for Problem One Long Term Goal  RN CM reiterated action plan and symptom management  THN CM Short Term Goal #1 (0-30 days)  pt will have no [facility] readmissions within 30 days  THN CM Short Term Goal #1 Start Date  09/26/15  THN CM Short Term Goal #1 Met Date  10/30/15  THN CM Short  Term Goal #2 (0-30 days)  Pt will practice deep breathing and coughing regularly within 30 days.  THN CM Short Term Goal #2 Start Date  10/30/15 [goal restarted]  THN CM Short Term Goal #2 Met Date  11/26/15    [facility] CM Care Plan Problem Two   Flowsheet Row Most Recent Value  Care Plan Problem Two  Difficulty managing pain  Role Documenting the Problem Two  Care Management Coordinator  Care Plan for Problem Two  Active  THN CM Short Term Goal #1 (0-30 days)  pt will have referral in place to pain clinic within 30 days  THN CM Short Term Goal #1 Start Date  10/30/15  [facility] CM Short Term Goal #1 Met Date   11/26/15 Barrie Folk met]  Interventions for Short Term Goal #2   Pt did go to pain clinic and has decided not to go back citing "that's not the place for me"    [facility] CM Care Plan Problem Three   Flowsheet Row Most Recent Value  Care Plan Problem Three  Issues with elevated blood pressure  Role Documenting the Problem Three  Care Management Coordinator  Care Plan for Problem Three  Active  THN CM Short Term Goal #1 (0-30 days)  Pt will check blood pressure daily and record  THN CM Short Term Goal #1 Start Date  11/26/15  [facility] CM Short Term Goal #1 Met Date  12/23/15  Interventions for Short Term Goal #1  RN CM reinforced importance of checking blood pressure and taking log to MD appointments      PLAN:  Discharge pt today  Jacqlyn Larsen [facility], BSN [place] Coordinator [phone]

## 2016-01-16 DIAGNOSIS — J411 Mucopurulent chronic bronchitis: Secondary | ICD-10-CM | POA: Diagnosis not present

## 2016-01-16 DIAGNOSIS — J441 Chronic obstructive pulmonary disease with (acute) exacerbation: Secondary | ICD-10-CM | POA: Diagnosis not present

## 2016-01-16 DIAGNOSIS — J449 Chronic obstructive pulmonary disease, unspecified: Secondary | ICD-10-CM | POA: Diagnosis not present

## 2016-01-16 DIAGNOSIS — N183 Chronic kidney disease, stage 3 (moderate): Secondary | ICD-10-CM | POA: Diagnosis not present

## 2016-01-16 DIAGNOSIS — Z7951 Long term (current) use of inhaled steroids: Secondary | ICD-10-CM | POA: Diagnosis not present

## 2016-01-16 DIAGNOSIS — I129 Hypertensive chronic kidney disease with stage 1 through stage 4 chronic kidney disease, or unspecified chronic kidney disease: Secondary | ICD-10-CM | POA: Diagnosis not present

## 2016-01-16 DIAGNOSIS — Z87891 Personal history of nicotine dependence: Secondary | ICD-10-CM | POA: Diagnosis not present

## 2016-01-16 DIAGNOSIS — J301 Allergic rhinitis due to pollen: Secondary | ICD-10-CM | POA: Diagnosis not present

## 2016-01-16 DIAGNOSIS — Z7952 Long term (current) use of systemic steroids: Secondary | ICD-10-CM | POA: Diagnosis not present

## 2016-01-20 ENCOUNTER — Emergency Department (HOSPITAL_COMMUNITY): Payer: PPO

## 2016-01-20 ENCOUNTER — Emergency Department (HOSPITAL_COMMUNITY)
Admission: EM | Admit: 2016-01-20 | Discharge: 2016-01-20 | Disposition: A | Discharge status: Home / Self Care | Payer: PPO | Attending: Emergency Medicine | Admitting: Emergency Medicine

## 2016-01-20 ENCOUNTER — Encounter (HOSPITAL_COMMUNITY): Payer: Self-pay | Admitting: Emergency Medicine

## 2016-01-20 DIAGNOSIS — I129 Hypertensive chronic kidney disease with stage 1 through stage 4 chronic kidney disease, or unspecified chronic kidney disease: Secondary | ICD-10-CM | POA: Insufficient documentation

## 2016-01-20 DIAGNOSIS — J45909 Unspecified asthma, uncomplicated: Secondary | ICD-10-CM | POA: Diagnosis not present

## 2016-01-20 DIAGNOSIS — J441 Chronic obstructive pulmonary disease with (acute) exacerbation: Secondary | ICD-10-CM

## 2016-01-20 DIAGNOSIS — Z87891 Personal history of nicotine dependence: Secondary | ICD-10-CM | POA: Diagnosis not present

## 2016-01-20 DIAGNOSIS — R0602 Shortness of breath: Secondary | ICD-10-CM | POA: Diagnosis not present

## 2016-01-20 DIAGNOSIS — Z792 Long term (current) use of antibiotics: Secondary | ICD-10-CM | POA: Insufficient documentation

## 2016-01-20 DIAGNOSIS — Z79899 Other long term (current) drug therapy: Secondary | ICD-10-CM | POA: Insufficient documentation

## 2016-01-20 DIAGNOSIS — N183 Chronic kidney disease, stage 3 (moderate): Secondary | ICD-10-CM | POA: Diagnosis not present

## 2016-01-20 LAB — CBC WITH DIFFERENTIAL/PLATELET
BASOS ABS: 0 10*3/uL (ref 0.0–0.1)
BASOS PCT: 0 %
EOS ABS: 0 10*3/uL (ref 0.0–0.7)
Eosinophils Relative: 0 %
HEMATOCRIT: 34.3 % — AB (ref 36.0–46.0)
HEMOGLOBIN: 11 g/dL — AB (ref 12.0–15.0)
Lymphocytes Relative: 32 %
Lymphs Abs: 3.7 10*3/uL (ref 0.7–4.0)
MCH: 24.9 pg — ABNORMAL LOW (ref 26.0–34.0)
MCHC: 32.1 g/dL (ref 30.0–36.0)
MCV: 77.6 fL — ABNORMAL LOW (ref 78.0–100.0)
MONO ABS: 0.5 10*3/uL (ref 0.1–1.0)
Monocytes Relative: 5 %
NEUTROS ABS: 7.3 10*3/uL (ref 1.7–7.7)
NEUTROS PCT: 63 %
Platelets: 247 10*3/uL (ref 150–400)
RBC: 4.42 MIL/uL (ref 3.87–5.11)
RDW: 15.5 % (ref 11.5–15.5)
WBC: 11.5 10*3/uL — ABNORMAL HIGH (ref 4.0–10.5)

## 2016-01-20 LAB — COMPREHENSIVE METABOLIC PANEL
ALBUMIN: 4.1 g/dL (ref 3.5–5.0)
ALT: 42 U/L (ref 14–54)
ANION GAP: 9 (ref 5–15)
AST: 31 U/L (ref 15–41)
Alkaline Phosphatase: 53 U/L (ref 38–126)
BILIRUBIN TOTAL: 0.6 mg/dL (ref 0.3–1.2)
BUN: 23 mg/dL — AB (ref 6–20)
CO2: 22 mmol/L (ref 22–32)
Calcium: 9 mg/dL (ref 8.9–10.3)
Chloride: 99 mmol/L — ABNORMAL LOW (ref 101–111)
Creatinine, Ser: 1.64 mg/dL — ABNORMAL HIGH (ref 0.44–1.00)
GFR calc Af Amer: 35 mL/min — ABNORMAL LOW (ref >60)
GFR calc non Af Amer: 30 mL/min — ABNORMAL LOW (ref >60)
GLUCOSE: 108 mg/dL — AB (ref 65–99)
POTASSIUM: 3.3 mmol/L — AB (ref 3.5–5.1)
SODIUM: 130 mmol/L — AB (ref 135–145)
TOTAL PROTEIN: 6.8 g/dL (ref 6.5–8.1)

## 2016-01-20 LAB — D-DIMER, QUANTITATIVE (NOT AT ARMC): D DIMER QUANT: 0.69 ug{FEU}/mL — AB (ref 0.00–0.50)

## 2016-01-20 LAB — I-STAT TROPONIN, ED: Troponin i, poc: 0 ng/mL (ref 0.00–0.08)

## 2016-01-20 LAB — BRAIN NATRIURETIC PEPTIDE: B NATRIURETIC PEPTIDE 5: 28 pg/mL (ref 0.0–100.0)

## 2016-01-20 MED ORDER — LORAZEPAM 2 MG/ML IJ SOLN
1.0000 mg | Freq: Once | INTRAMUSCULAR | Status: AC
  Administered 2016-01-20: 1 mg via INTRAVENOUS
  Filled 2016-01-20: qty 1

## 2016-01-20 MED ORDER — ALBUTEROL SULFATE (2.5 MG/3ML) 0.083% IN NEBU
2.5000 mg | INHALATION_SOLUTION | Freq: Once | RESPIRATORY_TRACT | Status: AC
  Administered 2016-01-20: 2.5 mg via RESPIRATORY_TRACT
  Filled 2016-01-20: qty 3

## 2016-01-20 MED ORDER — IPRATROPIUM-ALBUTEROL 0.5-2.5 (3) MG/3ML IN SOLN
RESPIRATORY_TRACT | Status: AC
  Administered 2016-01-20: 15:00:00
  Filled 2016-01-20: qty 3

## 2016-01-20 MED ORDER — IPRATROPIUM-ALBUTEROL 0.5-2.5 (3) MG/3ML IN SOLN
3.0000 mL | Freq: Once | RESPIRATORY_TRACT | Status: AC
  Administered 2016-01-20: 3 mL via RESPIRATORY_TRACT
  Filled 2016-01-20: qty 3

## 2016-01-20 NOTE — Discharge Instructions (Signed)
Follow-up with your lung doctor later this week if not improving

## 2016-01-20 NOTE — ED Provider Notes (Signed)
[facility] DEPT Provider Note   CSN: [id number] Arrival date & time: 01/20/16  1351     History   Chief Complaint Chief Complaint  Patient presents with  . Shortness of Breath    HPI Glenda Terry is a 75 y.o. female.  Patient complains of shortness of breath today. Patient has a history of COPD and was seen recently by her pulmonologist and started on prednisone.   The history is provided by the patient. No language interpreter was used.  Shortness of Breath  This is a recurrent problem. The problem occurs intermittently.The current episode started 2 days ago. The problem has not changed since onset.Pertinent negatives include no fever, no headaches, no cough, no chest pain, no abdominal pain and no rash.    Past Medical History:  Diagnosis Date  . Anemia NOV 2013  . Anxiety   . Arthritis    inflammatory arthritis  . Asthma 1991   not COPD per [facility]  . Back pain, chronic   . Complication of anesthesia 1985   VITALS SIGNS DROPPED AND COULD NOT TAKE PAIN MEDS UNTIL VITALS IMPROVED  . COPD (chronic obstructive pulmonary disease) ([place])   . GERD (gastroesophageal reflux disease)   . Hypertension   . Tubular adenoma 11/2012    Patient Active Problem List   Diagnosis Date Noted  . Asthma exacerbation 09/20/2015  . Acute respiratory failure ([place]) 09/20/2015  . CKD (chronic kidney disease) stage 3, GFR 30-59 ml/min 09/20/2015  . Hemorrhoids, internal 08/21/2015  . 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 Microcytic anemia 11/01/2012  . FH: colon cancer 11/01/2012  . 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 Herniated lumbar intervertebral disc 04/20/2012  . Difficulty in walking(719.7) 03/13/2012  . Stiffness of joint, not elsewhere classified, lower leg 03/13/2012  . Weakness of right leg 03/13/2012  . OA (osteoarthritis) of knee 01/24/2012  . Anxiety 02/09/2011  . HTN (hypertension) 02/09/2011  . Arthritis  02/09/2011  . GERD (gastroesophageal reflux disease) 02/09/2011    Past Surgical History:  Procedure Laterality Date  . ABDOMINAL HYSTERECTOMY    . BACK SURGERY    . CARPAL TUNNEL RELEASE Right 4/14  . CATARACT EXTRACTION W/PHACO Left 11/19/2014   Procedure: CATARACT EXTRACTION PHACO AND INTRAOCULAR LENS PLACEMENT (IOC);  Surgeon: Williams Che, MD;  Location: [facility];  Service: Ophthalmology;  Laterality: Left;  CDE: 4.77  . CATARACT EXTRACTION W/PHACO Right 02/03/2015   Procedure: CATARACT EXTRACTION PHACO AND INTRAOCULAR LENS PLACEMENT ([place]);  Surgeon: Williams Che, MD;  Location: [facility];  Service: Ophthalmology;  Laterality: Right;  CDE: 4.54  . COLONOSCOPY  06/19/2008   RMR: tortuous and elongated colon with scattered left-sided diverticula/colonic mucosa appeared entirely normal. Prior colonic ulcers had resolved.  . COLONOSCOPY  12/2007   Dr. Gala Romney: Scattered diffuse sigmoid diverticula, 2 areas of ulceration at the hepatic flexure. Biopsies unremarkable.  . COLONOSCOPY N/A 11/20/2012   next TCS 11/2017  . DECOMPRESSIVE LUMBAR LAMINECTOMY LEVEL 2  04/20/2012   Procedure: DECOMPRESSIVE LUMBAR LAMINECTOMY LEVEL 2;  Surgeon: Tobi Bastos, MD;  Location: [facility];  Service: Orthopedics;  Laterality: Right;  Decompressive Lumbar Laminectomy of the L4 - L5 and L5 - S1 Complete/Laminectomy L5 on the Right (X-Ray)  . ESOPHAGOGASTRODUODENOSCOPY  12/2007   Dr. 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 Small hiatal hernia. Slightly pale duodenal mucosa (biopsy unremarkable)  . KNEE ARTHROSCOPY Right   . REVERSE SHOULDER ARTHROPLASTY Right 05/24/2014  Procedure: RIGHT SHOULDER REVERSE ARTHROPLASTY;  Surgeon: Netta Cedars, MD;  Location: [facility];  Service: Orthopedics;  Laterality: Right;  . TOTAL KNEE ARTHROPLASTY  01/24/2012   Procedure: TOTAL KNEE ARTHROPLASTY;  Surgeon: Gearlean Alf, MD;  Location: [facility];  Service: Orthopedics;  Laterality:  Right;  . TUBAL LIGATION      OB History    Gravida Para Term Preterm AB Living   3 3 2 1   3    SAB TAB Ectopic Multiple Live Births                   Home Medications    Prior to Admission medications   Medication Sig Start Date End Date Taking? Authorizing Provider  albuterol (PROAIR HFA) 108 (90 BASE) MCG/ACT inhaler Inhale 2 puffs into the lungs every 6 (six) hours as needed for wheezing or shortness of breath. For COPD   Yes Historical Provider, MD  ALPRAZolam Duanne Moron) 0.5 MG tablet Take 0.5 mg by mouth 4 (four) times daily as needed for anxiety. For anxiety   Yes Historical Provider, MD  azithromycin (ZITHROMAX) 250 MG tablet Take 250 mg by mouth daily.    Yes Historical Provider, MD  budesonide-formoterol (SYMBICORT) 160-4.5 MCG/ACT inhaler Inhale 2 puffs into the lungs 2 (two) times daily.   Yes Historical Provider, MD  cetirizine (ZYRTEC) 10 MG tablet Take 10 mg by mouth daily.   Yes Historical Provider, MD  EPINEPHrine (EPIPEN 2-PAK) 0.3 mg/0.3 mL DEVI Inject 0.3 mg into the muscle once. Reported on 09/26/2015   Yes Historical Provider, MD  ferrous sulfate 325 (65 FE) MG tablet Take 325 mg by mouth daily with breakfast.   Yes Historical Provider, MD  fluticasone (FLONASE) 50 MCG/ACT nasal spray Place 1 spray into both nostrils 2 (two) times daily.   Yes Historical Provider, MD  folic acid (FOLVITE) 1 MG tablet Take 1 mg by mouth daily.   Yes Historical Provider, MD  ipratropium-albuterol (DUONEB) 0.5-2.5 (3) MG/3ML SOLN Take 3 mLs by nebulization every 6 (six) hours as needed.   Yes Historical Provider, MD  magnesium oxide (MAG-OX) 400 MG tablet Take 400 mg by mouth 2 (two) times daily.  01/16/16  Yes Historical Provider, MD  montelukast (SINGULAIR) 10 MG tablet Take 10 mg by mouth at bedtime.  04/12/15  Yes Historical Provider, MD  pantoprazole (PROTONIX) 40 MG tablet Take 40 mg by mouth at bedtime.    Yes Historical Provider, MD  predniSONE (DELTASONE) 10 MG tablet Take 10 mg by  mouth daily with breakfast.  01/16/16  Yes Historical Provider, MD  telmisartan-hydrochlorothiazide (MICARDIS HCT) 80-12.5 MG tablet Take 1 tablet by mouth daily.   Yes Historical Provider, MD  albuterol (PROVENTIL) (2.5 MG/3ML) 0.083% nebulizer solution Take 3 mLs (2.5 mg total) by nebulization every 4 (four) hours as needed for wheezing or shortness of breath. Patient not taking: Reported on 01/20/2016 09/25/15   Kathie Dike, MD  guaiFENesin (MUCINEX) 600 MG 12 hr tablet Take 1 tablet (600 mg total) by mouth 2 (two) times daily. Patient not taking: Reported on 12/23/2015 09/25/15   Kathie Dike, MD  hydrocortisone (PROCTOZONE-HC) 2.5 % rectal cream Place 1 application rectally 2 (two) times daily. Patient not taking: Reported on 01/20/2016 08/21/15   Mahala Menghini, PA-C    Family History Family History  Problem Relation Age of Onset  . Breast cancer Sister   . Colon cancer Sister     two deceased, one living and terminal, one current undergoing treatment,  ages 21, 37, 61, 66    Social History Social History  Substance Use Topics  . Smoking status: Former Smoker    Packs/day: 2.00    Years: 40.00    Types: Cigarettes    Quit date: 01/19/1992  . Smokeless tobacco: Never Used  . Alcohol use No     Allergies   Flexeril [cyclobenzaprine]; Other; Keflex [cephalexin]; Aspirin; Statins; Adhesive [tape]; and Chlorhexidine   Review of Systems Review of Systems  Constitutional: Negative for appetite change, fatigue and fever.  HENT: Negative for congestion, ear discharge and sinus pressure.   Eyes: Negative for discharge.  Respiratory: Positive for shortness of breath. Negative for cough.   Cardiovascular: Negative for chest pain.  Gastrointestinal: Negative for abdominal pain and diarrhea.  Genitourinary: Negative for frequency and hematuria.  Musculoskeletal: Negative for back pain.  Skin: Negative for rash.  Neurological: Negative for seizures and headaches.    Psychiatric/Behavioral: Negative for hallucinations.     Physical Exam Updated Vital Signs BP 153/80 (BP Location: Left Arm)   Pulse 115   Temp 98.7 F (37.1 C) (Oral)   Resp 18   Ht 5' (1.524 m)   Wt 195 lb (88.5 kg)   SpO2 100%   BMI 38.08 kg/m   Physical Exam  Constitutional: She is oriented to person, place, and time. She appears well-developed.  HENT:  Head: Normocephalic.  Eyes: Conjunctivae and EOM are normal. No scleral icterus.  Neck: Neck supple. No thyromegaly present.  Cardiovascular: Normal rate and regular rhythm.  Exam reveals no gallop and no friction rub.   No murmur heard. Pulmonary/Chest: No stridor. She has wheezes. She has no rales. She exhibits no tenderness.  Minimal wheezing  Abdominal: She exhibits no distension. There is no tenderness. There is no rebound.  Musculoskeletal: Normal range of motion. She exhibits no edema.  Lymphadenopathy:    She has no cervical adenopathy.  Neurological: She is oriented to person, place, and time. She exhibits normal muscle tone. Coordination normal.  Skin: No rash noted. No erythema.  Psychiatric: She has a normal mood and affect. Her behavior is normal.     ED Treatments / Results  Labs (all labs ordered are listed, but only abnormal results are displayed) Labs Reviewed  CBC WITH DIFFERENTIAL/PLATELET - Abnormal; Notable for the following:       Result Value   WBC 11.5 (*)    Hemoglobin 11.0 (*)    HCT 34.3 (*)    MCV 77.6 (*)    MCH 24.9 (*)    All other components within normal limits  COMPREHENSIVE METABOLIC PANEL - Abnormal; Notable for the following:    Sodium 130 (*)    Potassium 3.3 (*)    Chloride 99 (*)    Glucose, Bld 108 (*)    BUN 23 (*)    Creatinine, Ser 1.64 (*)    GFR calc non Af Amer 30 (*)    GFR calc Af Amer 35 (*)    All other components within normal limits  D-DIMER, QUANTITATIVE (NOT AT [facility]) - Abnormal; Notable for the following:    D-Dimer, Quant 0.69 (*)    All other  components within normal limits  BRAIN NATRIURETIC PEPTIDE  I-STAT TROPOININ, ED    EKG  EKG Interpretation  Date/Time:  Tuesday January 20 2016 14:15:47 EST Ventricular Rate:  100 PR Interval:    QRS Duration: 90 QT Interval:  347 QTC Calculation: 448 R Axis:   -26 Text Interpretation:  Sinus tachycardia  Borderline left axis deviation Low voltage, precordial leads Confirmed by Breionna Punt  MD, Mishel Sans [phone]) on 01/20/2016 4:06:31 PM       Radiology Dg Chest 2 View  Result Date: 01/20/2016 CLINICAL DATA:  75 y/o  F; shortness of breath and chest pressure. EXAM: CHEST  2 VIEW COMPARISON:  10/15/2015 chest radiograph FINDINGS: The heart size and mediastinal contours are within normal limits and stable. Both lungs are clear. Partially visualized right shoulder reverse arthroplasty. IMPRESSION: No acute pulmonary process. Electronically Signed   By: Kristine Garbe M.D.   On: 01/20/2016 14:49    Procedures Procedures (including critical care time)  Medications Ordered in ED Medications  ipratropium-albuterol (DUONEB) 0.5-2.5 (3) MG/3ML nebulizer solution 3 mL (not administered)  albuterol (PROVENTIL) (2.5 MG/3ML) 0.083% nebulizer solution 2.5 mg (not administered)  ipratropium-albuterol (DUONEB) 0.5-2.5 (3) MG/3ML nebulizer solution (  Given 01/20/16 1445)  LORazepam (ATIVAN) injection 1 mg (1 mg Intravenous Given 01/20/16 1456)     Initial Impression / Assessment and Plan / ED Course  I have reviewed the triage vital signs and the nursing notes.  Pertinent labs & imaging results that were available during my care of the patient were reviewed by me and considered in my medical decision making (see chart for details).  Clinical Course     Minimal COPD exacerbation. Patient will follow back up with her pulmonologist  Final Clinical Impressions(s) / ED Diagnoses   Final diagnoses:  None    New Prescriptions New Prescriptions   No medications on file     Milton Ferguson, MD 01/20/16 [phone]

## 2016-01-20 NOTE — ED Triage Notes (Signed)
PT states increased SOB that started yesterday evening. PT states she was seen at her Pulmonologist on 01/16/16 and was given 80 mg Kenalog IM shot and started prednisone 10mg  every day with magnesium 1 tablet twice daily and pt states she takes azithromycin continuously due to asthma. PT lung fields are clear but pt states difficulty breathing and pt able to be calmed down with verbal relaxation techniques in triage.

## 2016-01-29 DIAGNOSIS — J441 Chronic obstructive pulmonary disease with (acute) exacerbation: Secondary | ICD-10-CM | POA: Diagnosis not present

## 2016-01-29 DIAGNOSIS — J301 Allergic rhinitis due to pollen: Secondary | ICD-10-CM | POA: Diagnosis not present

## 2016-01-29 DIAGNOSIS — Z7952 Long term (current) use of systemic steroids: Secondary | ICD-10-CM | POA: Diagnosis not present

## 2016-01-29 DIAGNOSIS — N183 Chronic kidney disease, stage 3 (moderate): Secondary | ICD-10-CM | POA: Diagnosis not present

## 2016-01-29 DIAGNOSIS — J411 Mucopurulent chronic bronchitis: Secondary | ICD-10-CM | POA: Diagnosis not present

## 2016-01-29 DIAGNOSIS — I129 Hypertensive chronic kidney disease with stage 1 through stage 4 chronic kidney disease, or unspecified chronic kidney disease: Secondary | ICD-10-CM | POA: Diagnosis not present

## 2016-01-29 DIAGNOSIS — Z23 Encounter for immunization: Secondary | ICD-10-CM | POA: Diagnosis not present

## 2016-01-29 DIAGNOSIS — K219 Gastro-esophageal reflux disease without esophagitis: Secondary | ICD-10-CM | POA: Diagnosis not present

## 2016-01-29 DIAGNOSIS — M069 Rheumatoid arthritis, unspecified: Secondary | ICD-10-CM | POA: Diagnosis not present

## 2016-01-29 DIAGNOSIS — F17210 Nicotine dependence, cigarettes, uncomplicated: Secondary | ICD-10-CM | POA: Diagnosis not present

## 2016-02-12 DIAGNOSIS — J441 Chronic obstructive pulmonary disease with (acute) exacerbation: Secondary | ICD-10-CM | POA: Diagnosis not present

## 2016-02-12 DIAGNOSIS — Z7952 Long term (current) use of systemic steroids: Secondary | ICD-10-CM | POA: Diagnosis not present

## 2016-02-12 DIAGNOSIS — I129 Hypertensive chronic kidney disease with stage 1 through stage 4 chronic kidney disease, or unspecified chronic kidney disease: Secondary | ICD-10-CM | POA: Diagnosis not present

## 2016-02-12 DIAGNOSIS — K219 Gastro-esophageal reflux disease without esophagitis: Secondary | ICD-10-CM | POA: Diagnosis not present

## 2016-02-12 DIAGNOSIS — N183 Chronic kidney disease, stage 3 (moderate): Secondary | ICD-10-CM | POA: Diagnosis not present

## 2016-02-12 DIAGNOSIS — Z87891 Personal history of nicotine dependence: Secondary | ICD-10-CM | POA: Diagnosis not present

## 2016-02-12 DIAGNOSIS — J301 Allergic rhinitis due to pollen: Secondary | ICD-10-CM | POA: Diagnosis not present

## 2016-02-12 DIAGNOSIS — J411 Mucopurulent chronic bronchitis: Secondary | ICD-10-CM | POA: Diagnosis not present

## 2016-03-04 DIAGNOSIS — J411 Mucopurulent chronic bronchitis: Secondary | ICD-10-CM | POA: Diagnosis not present

## 2016-03-04 DIAGNOSIS — I129 Hypertensive chronic kidney disease with stage 1 through stage 4 chronic kidney disease, or unspecified chronic kidney disease: Secondary | ICD-10-CM | POA: Diagnosis not present

## 2016-03-04 DIAGNOSIS — Z7951 Long term (current) use of inhaled steroids: Secondary | ICD-10-CM | POA: Diagnosis not present

## 2016-03-04 DIAGNOSIS — J449 Chronic obstructive pulmonary disease, unspecified: Secondary | ICD-10-CM | POA: Diagnosis not present

## 2016-03-04 DIAGNOSIS — Z7952 Long term (current) use of systemic steroids: Secondary | ICD-10-CM | POA: Diagnosis not present

## 2016-03-04 DIAGNOSIS — Z87891 Personal history of nicotine dependence: Secondary | ICD-10-CM | POA: Diagnosis not present

## 2016-03-04 DIAGNOSIS — J301 Allergic rhinitis due to pollen: Secondary | ICD-10-CM | POA: Diagnosis not present

## 2016-03-04 DIAGNOSIS — N183 Chronic kidney disease, stage 3 (moderate): Secondary | ICD-10-CM | POA: Diagnosis not present

## 2016-03-04 DIAGNOSIS — Z79899 Other long term (current) drug therapy: Secondary | ICD-10-CM | POA: Diagnosis not present

## 2016-03-05 DIAGNOSIS — Z0000 Encounter for general adult medical examination without abnormal findings: Secondary | ICD-10-CM | POA: Diagnosis not present

## 2016-03-22 DIAGNOSIS — I10 Essential (primary) hypertension: Secondary | ICD-10-CM | POA: Diagnosis not present

## 2016-03-22 DIAGNOSIS — R7301 Impaired fasting glucose: Secondary | ICD-10-CM | POA: Diagnosis not present

## 2016-03-24 DIAGNOSIS — E6609 Other obesity due to excess calories: Secondary | ICD-10-CM | POA: Diagnosis not present

## 2016-03-24 DIAGNOSIS — D509 Iron deficiency anemia, unspecified: Secondary | ICD-10-CM | POA: Diagnosis not present

## 2016-03-24 DIAGNOSIS — R7301 Impaired fasting glucose: Secondary | ICD-10-CM | POA: Diagnosis not present

## 2016-03-24 DIAGNOSIS — M545 Low back pain: Secondary | ICD-10-CM | POA: Diagnosis not present

## 2016-03-24 DIAGNOSIS — J4542 Moderate persistent asthma with status asthmaticus: Secondary | ICD-10-CM | POA: Diagnosis not present

## 2016-03-24 DIAGNOSIS — I10 Essential (primary) hypertension: Secondary | ICD-10-CM | POA: Diagnosis not present

## 2016-03-24 DIAGNOSIS — R944 Abnormal results of kidney function studies: Secondary | ICD-10-CM | POA: Diagnosis not present

## 2016-04-13 ENCOUNTER — Other Ambulatory Visit (HOSPITAL_COMMUNITY): Payer: Self-pay | Admitting: Internal Medicine

## 2016-04-13 DIAGNOSIS — Z1231 Encounter for screening mammogram for malignant neoplasm of breast: Secondary | ICD-10-CM

## 2016-04-15 DIAGNOSIS — R600 Localized edema: Secondary | ICD-10-CM | POA: Diagnosis not present

## 2016-04-15 DIAGNOSIS — E6609 Other obesity due to excess calories: Secondary | ICD-10-CM | POA: Diagnosis not present

## 2016-04-15 DIAGNOSIS — R7301 Impaired fasting glucose: Secondary | ICD-10-CM | POA: Diagnosis not present

## 2016-04-15 DIAGNOSIS — R944 Abnormal results of kidney function studies: Secondary | ICD-10-CM | POA: Diagnosis not present

## 2016-04-15 DIAGNOSIS — J4542 Moderate persistent asthma with status asthmaticus: Secondary | ICD-10-CM | POA: Diagnosis not present

## 2016-04-15 DIAGNOSIS — I10 Essential (primary) hypertension: Secondary | ICD-10-CM | POA: Diagnosis not present

## 2016-04-15 DIAGNOSIS — K219 Gastro-esophageal reflux disease without esophagitis: Secondary | ICD-10-CM | POA: Diagnosis not present

## 2016-04-16 DIAGNOSIS — I129 Hypertensive chronic kidney disease with stage 1 through stage 4 chronic kidney disease, or unspecified chronic kidney disease: Secondary | ICD-10-CM | POA: Diagnosis not present

## 2016-04-16 DIAGNOSIS — Z87891 Personal history of nicotine dependence: Secondary | ICD-10-CM | POA: Diagnosis not present

## 2016-04-16 DIAGNOSIS — J411 Mucopurulent chronic bronchitis: Secondary | ICD-10-CM | POA: Diagnosis not present

## 2016-04-16 DIAGNOSIS — Z6841 Body Mass Index (BMI) 40.0 and over, adult: Secondary | ICD-10-CM | POA: Diagnosis not present

## 2016-04-16 DIAGNOSIS — J301 Allergic rhinitis due to pollen: Secondary | ICD-10-CM | POA: Diagnosis not present

## 2016-04-16 DIAGNOSIS — J449 Chronic obstructive pulmonary disease, unspecified: Secondary | ICD-10-CM | POA: Diagnosis not present

## 2016-04-16 DIAGNOSIS — K219 Gastro-esophageal reflux disease without esophagitis: Secondary | ICD-10-CM | POA: Diagnosis not present

## 2016-04-16 DIAGNOSIS — Z7952 Long term (current) use of systemic steroids: Secondary | ICD-10-CM | POA: Diagnosis not present

## 2016-04-16 DIAGNOSIS — N183 Chronic kidney disease, stage 3 (moderate): Secondary | ICD-10-CM | POA: Diagnosis not present

## 2016-05-05 ENCOUNTER — Ambulatory Visit (HOSPITAL_COMMUNITY): Payer: PPO

## 2016-05-06 ENCOUNTER — Ambulatory Visit (HOSPITAL_COMMUNITY)
Admission: RE | Admit: 2016-05-06 | Discharge: 2016-05-06 | Disposition: A | Discharge status: Home / Self Care | Payer: PPO | Source: Ambulatory Visit | Attending: Internal Medicine | Admitting: Internal Medicine

## 2016-05-06 DIAGNOSIS — Z1231 Encounter for screening mammogram for malignant neoplasm of breast: Secondary | ICD-10-CM | POA: Insufficient documentation

## 2016-05-13 DIAGNOSIS — K649 Unspecified hemorrhoids: Secondary | ICD-10-CM | POA: Diagnosis not present

## 2016-05-14 ENCOUNTER — Institutional Professional Consult (permissible substitution): Payer: PPO | Admitting: Internal Medicine

## 2016-05-27 DIAGNOSIS — Z87891 Personal history of nicotine dependence: Secondary | ICD-10-CM | POA: Diagnosis not present

## 2016-05-27 DIAGNOSIS — K219 Gastro-esophageal reflux disease without esophagitis: Secondary | ICD-10-CM | POA: Diagnosis not present

## 2016-05-27 DIAGNOSIS — J449 Chronic obstructive pulmonary disease, unspecified: Secondary | ICD-10-CM | POA: Diagnosis not present

## 2016-05-27 DIAGNOSIS — J411 Mucopurulent chronic bronchitis: Secondary | ICD-10-CM | POA: Diagnosis not present

## 2016-05-27 DIAGNOSIS — D721 Eosinophilia: Secondary | ICD-10-CM | POA: Diagnosis not present

## 2016-05-27 DIAGNOSIS — J4551 Severe persistent asthma with (acute) exacerbation: Secondary | ICD-10-CM | POA: Diagnosis not present

## 2016-05-27 DIAGNOSIS — Z7952 Long term (current) use of systemic steroids: Secondary | ICD-10-CM | POA: Diagnosis not present

## 2016-06-03 ENCOUNTER — Other Ambulatory Visit: Payer: Self-pay

## 2016-06-03 NOTE — Patient Outreach (Signed)
[facility]) Care Management  06/03/2016  Glenda Terry January 03, 1941 [id number]   Telephone Screen  Referral Date: 06/02/16 Referral Source: Nurse Call Center Report Referral Reason: "caller states she has cramps in legs and feet, pain 8/10 at night, is 3/10 now"    Outreach attempt # 1 to patient. Spoke with patient. She states she is doing fairly well. She reports that pain and cramping to legs continues. She rates pain 4/10 at present. She has not taken anything for pain today. Last dose of Tylenol was yesterday evening prior to going to bed. Encouraged patient to take Tylenol as needed to help with better pain control. She voiced understanding. Encouraged her to make appt with PCP to be evaluated as this pain has bene going on for some time. Patient reported she wanted to wait it out until the beginning of next week. If pain was not better then she plans to make PCP appt. She denies any issues with managing her meds or transportation. Patient states she lives alone and care manage her care. No further RN CM needs or concerns at this time. Patient advised to call 24 hr Nurse Line as needed for any future needs or concerns. She voiced understanding and was appreciative of call.      Plan: RN CM will notify [facility] administrative assistant of case status.  Enzo [facility], RN,BSN,CCM [facility] Management Telephonic Care Management Coordinator Direct Phone: [phone] Toll Free: [phone] Fax: [phone]

## 2016-06-04 DIAGNOSIS — H01022 Squamous blepharitis right lower eyelid: Secondary | ICD-10-CM | POA: Diagnosis not present

## 2016-06-04 DIAGNOSIS — H01025 Squamous blepharitis left lower eyelid: Secondary | ICD-10-CM | POA: Diagnosis not present

## 2016-06-04 DIAGNOSIS — Z961 Presence of intraocular lens: Secondary | ICD-10-CM | POA: Diagnosis not present

## 2016-06-04 DIAGNOSIS — H10413 Chronic giant papillary conjunctivitis, bilateral: Secondary | ICD-10-CM | POA: Diagnosis not present

## 2016-06-04 DIAGNOSIS — H04123 Dry eye syndrome of bilateral lacrimal glands: Secondary | ICD-10-CM | POA: Diagnosis not present

## 2016-06-04 DIAGNOSIS — H01021 Squamous blepharitis right upper eyelid: Secondary | ICD-10-CM | POA: Diagnosis not present

## 2016-06-04 DIAGNOSIS — H01024 Squamous blepharitis left upper eyelid: Secondary | ICD-10-CM | POA: Diagnosis not present

## 2016-06-07 DIAGNOSIS — J4550 Severe persistent asthma, uncomplicated: Secondary | ICD-10-CM | POA: Insufficient documentation

## 2016-06-09 DIAGNOSIS — R898 Other abnormal findings in specimens from other organs, systems and tissues: Secondary | ICD-10-CM | POA: Insufficient documentation

## 2016-06-09 DIAGNOSIS — D721 Eosinophilia: Secondary | ICD-10-CM

## 2016-07-09 DIAGNOSIS — J4520 Mild intermittent asthma, uncomplicated: Secondary | ICD-10-CM | POA: Diagnosis not present

## 2016-07-09 DIAGNOSIS — J4550 Severe persistent asthma, uncomplicated: Secondary | ICD-10-CM | POA: Diagnosis not present

## 2016-07-09 DIAGNOSIS — J411 Mucopurulent chronic bronchitis: Secondary | ICD-10-CM | POA: Diagnosis not present

## 2016-07-09 DIAGNOSIS — J301 Allergic rhinitis due to pollen: Secondary | ICD-10-CM | POA: Diagnosis not present

## 2016-07-19 DIAGNOSIS — H10413 Chronic giant papillary conjunctivitis, bilateral: Secondary | ICD-10-CM | POA: Diagnosis not present

## 2016-07-19 DIAGNOSIS — H04123 Dry eye syndrome of bilateral lacrimal glands: Secondary | ICD-10-CM | POA: Diagnosis not present

## 2016-07-19 DIAGNOSIS — H01024 Squamous blepharitis left upper eyelid: Secondary | ICD-10-CM | POA: Diagnosis not present

## 2016-07-19 DIAGNOSIS — H01021 Squamous blepharitis right upper eyelid: Secondary | ICD-10-CM | POA: Diagnosis not present

## 2016-07-19 DIAGNOSIS — Z961 Presence of intraocular lens: Secondary | ICD-10-CM | POA: Diagnosis not present

## 2016-07-19 DIAGNOSIS — H01022 Squamous blepharitis right lower eyelid: Secondary | ICD-10-CM | POA: Diagnosis not present

## 2016-07-19 DIAGNOSIS — H01025 Squamous blepharitis left lower eyelid: Secondary | ICD-10-CM | POA: Diagnosis not present

## 2016-08-06 DIAGNOSIS — D721 Eosinophilia: Secondary | ICD-10-CM | POA: Diagnosis not present

## 2016-08-06 DIAGNOSIS — J4550 Severe persistent asthma, uncomplicated: Secondary | ICD-10-CM | POA: Diagnosis not present

## 2016-08-19 DIAGNOSIS — F411 Generalized anxiety disorder: Secondary | ICD-10-CM | POA: Diagnosis not present

## 2016-08-19 DIAGNOSIS — E6609 Other obesity due to excess calories: Secondary | ICD-10-CM | POA: Diagnosis not present

## 2016-08-19 DIAGNOSIS — K219 Gastro-esophageal reflux disease without esophagitis: Secondary | ICD-10-CM | POA: Diagnosis not present

## 2016-08-19 DIAGNOSIS — I10 Essential (primary) hypertension: Secondary | ICD-10-CM | POA: Diagnosis not present

## 2016-08-19 DIAGNOSIS — J82 Pulmonary eosinophilia, not elsewhere classified: Secondary | ICD-10-CM | POA: Diagnosis not present

## 2016-08-26 DIAGNOSIS — M542 Cervicalgia: Secondary | ICD-10-CM | POA: Diagnosis not present

## 2016-08-26 DIAGNOSIS — M19012 Primary osteoarthritis, left shoulder: Secondary | ICD-10-CM | POA: Diagnosis not present

## 2016-08-26 DIAGNOSIS — Z96611 Presence of right artificial shoulder joint: Secondary | ICD-10-CM | POA: Diagnosis not present

## 2016-09-03 DIAGNOSIS — J4550 Severe persistent asthma, uncomplicated: Secondary | ICD-10-CM | POA: Diagnosis not present

## 2016-09-03 DIAGNOSIS — D721 Eosinophilia: Secondary | ICD-10-CM | POA: Diagnosis not present

## 2016-09-21 DIAGNOSIS — E249 Cushing's syndrome, unspecified: Secondary | ICD-10-CM | POA: Diagnosis not present

## 2016-09-21 DIAGNOSIS — J4542 Moderate persistent asthma with status asthmaticus: Secondary | ICD-10-CM | POA: Diagnosis not present

## 2016-09-21 DIAGNOSIS — E6609 Other obesity due to excess calories: Secondary | ICD-10-CM | POA: Diagnosis not present

## 2016-09-30 NOTE — Congregational Nurse Program (Signed)
Congregational Nurse Program Note  Date of Encounter: 09/27/2016  Past Medical History: Past Medical History:  Diagnosis Date  . Anemia NOV 2013  . Anxiety   . Arthritis    inflammatory arthritis  . Asthma 1991   not COPD per [facility]  . Back pain, chronic   . Complication of anesthesia 1985   VITALS SIGNS DROPPED AND COULD NOT TAKE PAIN MEDS UNTIL VITALS IMPROVED  . COPD (chronic obstructive pulmonary disease) ([place])   . GERD (gastroesophageal reflux disease)   . Hypertension   . Tubular adenoma 11/2012    Encounter Details:     CNP Questionnaire - 09/27/16 1037      Patient Demographics   Is this a new or existing patient? New   Patient is considered a/an Not Applicable   Race African-American/Black     Patient Assistance   Location of Patient [place]   Patient's financial/insurance status Medicare   Uninsured Patient ([place] Card/Care Connects) No   Patient referred to apply for the following financial assistance Not Applicable   Food insecurities addressed Not Applicable   Transportation assistance No   Assistance securing medications No   Educational health offerings Chronic disease;Navigating the healthcare system     Encounter Details   Primary purpose of visit Chronic Illness/Condition Visit;Navigating the Healthcare System   Does patient have a medical provider? Yes   Patient referred to Follow up with established PCP;Other   Was a mental health screening completed? (GAINS tool) No   Does patient have dental issues? Yes   Was a dental referral made? Yes   Does patient have vision issues? No   Does your patient have an abnormal blood pressure today? No   Since previous encounter, have you referred patient for abnormal blood pressure that resulted in a new diagnosis or medication change? No   Does your patient have an abnormal blood glucose today? No   Since previous encounter, have you referred patient for abnormal blood glucose  that resulted in a new diagnosis or medication change? No   Was there a life-saving intervention made? No     Client requesting BP check today, is on medication for hypertension, asthma and has a PCP. Client is on Medicare/Supp.ins. BP reading today 130/81 P-99. Client in need of bottom dentures and given list of Dental Clinics in [place] to assist with this. Also was given information about [facility]. Encouraged to call if any further questions. Client was also interested in Nutrition/Diabetic classes  offered at [facility], was given card with dates of classes and number.  Tarri Fuller [phone].

## 2016-10-15 DIAGNOSIS — R252 Cramp and spasm: Secondary | ICD-10-CM | POA: Diagnosis not present

## 2016-10-15 DIAGNOSIS — R600 Localized edema: Secondary | ICD-10-CM | POA: Diagnosis not present

## 2016-10-15 DIAGNOSIS — R296 Repeated falls: Secondary | ICD-10-CM | POA: Diagnosis not present

## 2016-10-15 DIAGNOSIS — Z6836 Body mass index (BMI) 36.0-36.9, adult: Secondary | ICD-10-CM | POA: Diagnosis not present

## 2016-10-20 DIAGNOSIS — R600 Localized edema: Secondary | ICD-10-CM | POA: Diagnosis not present

## 2016-10-20 DIAGNOSIS — R252 Cramp and spasm: Secondary | ICD-10-CM | POA: Diagnosis not present

## 2016-10-20 DIAGNOSIS — Z6838 Body mass index (BMI) 38.0-38.9, adult: Secondary | ICD-10-CM | POA: Diagnosis not present

## 2016-10-20 DIAGNOSIS — I10 Essential (primary) hypertension: Secondary | ICD-10-CM | POA: Diagnosis not present

## 2016-10-20 DIAGNOSIS — R000 Tachycardia, unspecified: Secondary | ICD-10-CM | POA: Diagnosis not present

## 2016-10-20 DIAGNOSIS — N183 Chronic kidney disease, stage 3 (moderate): Secondary | ICD-10-CM | POA: Diagnosis not present

## 2016-10-28 DIAGNOSIS — R05 Cough: Secondary | ICD-10-CM | POA: Diagnosis not present

## 2016-10-28 DIAGNOSIS — R062 Wheezing: Secondary | ICD-10-CM | POA: Diagnosis not present

## 2016-10-28 DIAGNOSIS — J449 Chronic obstructive pulmonary disease, unspecified: Secondary | ICD-10-CM | POA: Diagnosis not present

## 2016-10-28 DIAGNOSIS — Z96651 Presence of right artificial knee joint: Secondary | ICD-10-CM | POA: Diagnosis not present

## 2016-10-28 DIAGNOSIS — I10 Essential (primary) hypertension: Secondary | ICD-10-CM | POA: Diagnosis not present

## 2016-10-28 DIAGNOSIS — J82 Pulmonary eosinophilia, not elsewhere classified: Secondary | ICD-10-CM | POA: Diagnosis not present

## 2016-10-28 DIAGNOSIS — I482 Chronic atrial fibrillation: Secondary | ICD-10-CM | POA: Diagnosis not present

## 2016-10-28 DIAGNOSIS — K219 Gastro-esophageal reflux disease without esophagitis: Secondary | ICD-10-CM | POA: Diagnosis not present

## 2016-10-28 DIAGNOSIS — R069 Unspecified abnormalities of breathing: Secondary | ICD-10-CM | POA: Diagnosis not present

## 2016-10-28 DIAGNOSIS — Z79899 Other long term (current) drug therapy: Secondary | ICD-10-CM | POA: Diagnosis not present

## 2016-11-01 ENCOUNTER — Ambulatory Visit: Payer: PPO | Admitting: Nutrition

## 2016-11-01 DIAGNOSIS — Z7952 Long term (current) use of systemic steroids: Secondary | ICD-10-CM | POA: Diagnosis not present

## 2016-11-01 DIAGNOSIS — Z87891 Personal history of nicotine dependence: Secondary | ICD-10-CM | POA: Diagnosis not present

## 2016-11-01 DIAGNOSIS — J301 Allergic rhinitis due to pollen: Secondary | ICD-10-CM | POA: Diagnosis not present

## 2016-11-01 DIAGNOSIS — J019 Acute sinusitis, unspecified: Secondary | ICD-10-CM | POA: Diagnosis not present

## 2016-11-01 DIAGNOSIS — J411 Mucopurulent chronic bronchitis: Secondary | ICD-10-CM | POA: Diagnosis not present

## 2016-11-01 DIAGNOSIS — J4551 Severe persistent asthma with (acute) exacerbation: Secondary | ICD-10-CM | POA: Diagnosis not present

## 2016-11-01 DIAGNOSIS — R490 Dysphonia: Secondary | ICD-10-CM | POA: Diagnosis not present

## 2016-11-02 ENCOUNTER — Ambulatory Visit: Payer: PPO | Admitting: Nutrition

## 2016-11-23 DIAGNOSIS — J82 Pulmonary eosinophilia, not elsewhere classified: Secondary | ICD-10-CM | POA: Diagnosis not present

## 2016-11-23 DIAGNOSIS — G8929 Other chronic pain: Secondary | ICD-10-CM | POA: Diagnosis not present

## 2016-11-23 DIAGNOSIS — N183 Chronic kidney disease, stage 3 (moderate): Secondary | ICD-10-CM | POA: Diagnosis not present

## 2016-11-23 DIAGNOSIS — Z23 Encounter for immunization: Secondary | ICD-10-CM | POA: Diagnosis not present

## 2016-11-23 DIAGNOSIS — J45998 Other asthma: Secondary | ICD-10-CM | POA: Diagnosis not present

## 2016-11-23 DIAGNOSIS — R51 Headache: Secondary | ICD-10-CM | POA: Diagnosis not present

## 2016-11-23 DIAGNOSIS — R7989 Other specified abnormal findings of blood chemistry: Secondary | ICD-10-CM | POA: Diagnosis not present

## 2016-11-23 DIAGNOSIS — E249 Cushing's syndrome, unspecified: Secondary | ICD-10-CM | POA: Diagnosis not present

## 2016-11-23 DIAGNOSIS — K219 Gastro-esophageal reflux disease without esophagitis: Secondary | ICD-10-CM | POA: Diagnosis not present

## 2016-11-23 DIAGNOSIS — R296 Repeated falls: Secondary | ICD-10-CM | POA: Diagnosis not present

## 2016-11-23 DIAGNOSIS — R600 Localized edema: Secondary | ICD-10-CM | POA: Diagnosis not present

## 2016-11-23 DIAGNOSIS — J3089 Other allergic rhinitis: Secondary | ICD-10-CM | POA: Diagnosis not present

## 2016-12-03 DIAGNOSIS — J301 Allergic rhinitis due to pollen: Secondary | ICD-10-CM | POA: Diagnosis not present

## 2016-12-03 DIAGNOSIS — N183 Chronic kidney disease, stage 3 (moderate): Secondary | ICD-10-CM | POA: Diagnosis not present

## 2016-12-03 DIAGNOSIS — D631 Anemia in chronic kidney disease: Secondary | ICD-10-CM | POA: Diagnosis not present

## 2016-12-03 DIAGNOSIS — Z87891 Personal history of nicotine dependence: Secondary | ICD-10-CM | POA: Diagnosis not present

## 2016-12-03 DIAGNOSIS — D721 Eosinophilia: Secondary | ICD-10-CM | POA: Diagnosis not present

## 2016-12-03 DIAGNOSIS — Z79899 Other long term (current) drug therapy: Secondary | ICD-10-CM | POA: Diagnosis not present

## 2016-12-03 DIAGNOSIS — Z7951 Long term (current) use of inhaled steroids: Secondary | ICD-10-CM | POA: Diagnosis not present

## 2016-12-03 DIAGNOSIS — J4551 Severe persistent asthma with (acute) exacerbation: Secondary | ICD-10-CM | POA: Diagnosis not present

## 2016-12-03 DIAGNOSIS — J411 Mucopurulent chronic bronchitis: Secondary | ICD-10-CM | POA: Diagnosis not present

## 2016-12-03 DIAGNOSIS — I129 Hypertensive chronic kidney disease with stage 1 through stage 4 chronic kidney disease, or unspecified chronic kidney disease: Secondary | ICD-10-CM | POA: Diagnosis not present

## 2016-12-03 DIAGNOSIS — J449 Chronic obstructive pulmonary disease, unspecified: Secondary | ICD-10-CM | POA: Diagnosis not present

## 2016-12-06 ENCOUNTER — Encounter: Payer: PPO | Attending: Internal Medicine | Admitting: Nutrition

## 2016-12-06 DIAGNOSIS — I10 Essential (primary) hypertension: Secondary | ICD-10-CM | POA: Diagnosis not present

## 2016-12-06 DIAGNOSIS — R7303 Prediabetes: Secondary | ICD-10-CM | POA: Diagnosis not present

## 2016-12-06 DIAGNOSIS — D631 Anemia in chronic kidney disease: Secondary | ICD-10-CM

## 2016-12-06 DIAGNOSIS — E669 Obesity, unspecified: Secondary | ICD-10-CM | POA: Insufficient documentation

## 2016-12-06 DIAGNOSIS — N183 Chronic kidney disease, stage 3 (moderate): Secondary | ICD-10-CM

## 2016-12-06 DIAGNOSIS — Z713 Dietary counseling and surveillance: Secondary | ICD-10-CM | POA: Insufficient documentation

## 2016-12-06 DIAGNOSIS — E249 Cushing's syndrome, unspecified: Secondary | ICD-10-CM | POA: Diagnosis not present

## 2016-12-06 NOTE — Patient Instructions (Addendum)
Goals > Follow My Plate Cut out snacks between meals Drink only water Measure oatmeal after it's cooked Use measuring cups Use Mrs Deliah Boston and other herbs and spices Take fluid pill as prescribed. Avoid processed foods Lose 1-2 lbs per week Keep a food journal.

## 2016-12-06 NOTE — Progress Notes (Addendum)
  Medical Nutrition Therapy:  Appt start time: 1330 end time:  1500.   Assessment:  Primary concerns today: Overweight, CKD Stg 3, HTN.Marland Kitchen LIves alone. Eats most foods at home. Most meals are baked and broiled. Eats whenever she gets hungry. She is weighing  221 lbs which is 6 lbs higher than usual.  Sounds to be wheezing and some shortness of breath today. Had been told to take fluid pills starting tomorrow. Advised to check with MD about swelling and meds.   She notes she doesn't add salt to her foods, but is eating a high salt diet. Feet her 2+ edema.Face and hands are puffy. Just went to [facility] on Friday and she is being treated for some kind of COPD. Denies having Congestive Heart Failure.  She notes she has to sleep on 2 pillows at night and get SOB easily just walking.   Exrcicse is limited due to her breathing.  Diet is excessive in calories, fat, sodium and low in fresh fruits and vegetables. She would benefit from bariatric surgery is her breathing/lungs could handler the surgery.  Preferred Learning Style:   Auditory  Visual  Hands on  Learning Readiness:   Ready  Change in progress   MEDICATIONS: See list   DIETARY INTAKE:  24-hr recall:  B ( AM): 2 cups coffee, 1/2 c oatmeal with apple juice and cinnamon,  Snk ( AM): snacks: sweets, chips L ( PM): 3 slices pizza thin slides,  16 oz coke, 2 oatmeal cookies, Snk ( PM): 2 slices pizza, oatmeal cookie and water D ( PM): skipped Snk ( PM):  Beverages: Water, sometimes soda.  Usual physical activity: ADL  Estimated energy needs: 1500  calories 170 g carbohydrates 112 g protein 42 g fat  Progress Towards Goal(s):  In progress.   Nutritional Diagnosis:  NB-1.1 Food and nutrition-related knowledge deficit As related to Diabetes.  As evidenced by A1C > 6.5%.    Intervention:  Nutrition and Pre-Diabetes education provided on a Low Salt Low Fat Diet using My Plate, CHO counting, meal planning, portion sizes, timing of  meals, avoiding snacks between meals  taking medications as prescribed, benefits of exercising 30 minutes per day. Reviewed avoiding processed, high salt foods and cutting out snacks. She notes she eats a lot of snacks but doesn't get hungry at meals.. Goals > Follow My Plate Cut out snacks between meals Drink only water Measure oatmeal after it's cooked Use measuring cups Use Mrs Deliah Boston and other herbs and spices Take fluid pill as prescribed. Avoid processed foods Lose 1-2 lbs per week Keep a food journal.  Teaching Method Utilized:  Visual Auditory Hands on  Handouts given during visit include:  Low Salt Diet  My Plate Method    Barriers to learning/adherence to lifestyle change: none  Demonstrated degree of understanding via:  Teach Back   Monitoring/Evaluation:  Dietary intake, exercise, , and body weight in 1 month(s).

## 2016-12-07 ENCOUNTER — Other Ambulatory Visit: Payer: Self-pay

## 2016-12-07 DIAGNOSIS — R1032 Left lower quadrant pain: Secondary | ICD-10-CM | POA: Diagnosis not present

## 2016-12-07 DIAGNOSIS — I10 Essential (primary) hypertension: Secondary | ICD-10-CM | POA: Diagnosis not present

## 2016-12-07 DIAGNOSIS — Z6839 Body mass index (BMI) 39.0-39.9, adult: Secondary | ICD-10-CM | POA: Diagnosis not present

## 2016-12-07 DIAGNOSIS — R600 Localized edema: Secondary | ICD-10-CM | POA: Diagnosis not present

## 2016-12-07 NOTE — Patient Outreach (Signed)
[facility]) Care Management  12/07/2016  Glenda Terry [birth year] [id number]   Telephone Screen  Referral Date:  12/06/16 Referral Source: Nurse Call Center Report Referral Reason: "caller states she is having stabbing lower abd pains on left side, no diarrhea or fever, symptoms for a week and worse this morning" Insurance:HTA     Outreach attempt #1 to patient. No answer and unable to leave voicemail message.      Plan: RN CM will make outreach attempt to patient within one business day if no return call.    Enzo Montgomery, RN,BSN,CCM [place] Management Telephonic Care Management Coordinator Direct Phone: [phone] Toll Free: [phone] Fax: [phone]

## 2016-12-08 ENCOUNTER — Other Ambulatory Visit: Payer: Self-pay

## 2016-12-08 ENCOUNTER — Telehealth: Payer: Self-pay | Admitting: Nutrition

## 2016-12-08 NOTE — Patient Outreach (Signed)
[facility]) Care Management  12/08/2016  TAURA LAMARRE 07/05/1940 [id number]   Telephone Screen  Referral Date:  12/06/16 Referral Source: Nurse Call Center Report Referral Reason: "caller states she is having stabbing lower abd pains on left side, no diarrhea or fever, symptoms for a week and worse this morning" Insurance:HTA   Outreach attempt #2 to patient. No answer at present and unable to leave voicemail message.    Plan: RN CM will make outreach attempt to patient within one business day if no return call.   Enzo Montgomery, RN,BSN,CCM [place] Management Telephonic Care Management Coordinator Direct Phone: [phone] Toll Free: [phone] Fax: [phone]

## 2016-12-08 NOTE — Telephone Encounter (Signed)
TC to pt. She note she is taking her fluid pills now and she has a lot less fluid in her legs now . She is avoiding salty foods. Wt is down to 209 lbs now on her scales at home.

## 2016-12-09 ENCOUNTER — Encounter (HOSPITAL_COMMUNITY): Payer: Self-pay | Admitting: Emergency Medicine

## 2016-12-09 ENCOUNTER — Ambulatory Visit (HOSPITAL_COMMUNITY): Admission: RE | Admit: 2016-12-09 | Payer: PPO | Source: Ambulatory Visit

## 2016-12-09 ENCOUNTER — Emergency Department (HOSPITAL_COMMUNITY): Payer: PPO

## 2016-12-09 ENCOUNTER — Emergency Department (HOSPITAL_COMMUNITY)
Admit: 2016-12-09 | Discharge: 2016-12-09 | Disposition: A | Discharge status: Home / Self Care | Payer: PPO | Attending: Emergency Medicine | Admitting: Emergency Medicine

## 2016-12-09 ENCOUNTER — Other Ambulatory Visit: Payer: Self-pay

## 2016-12-09 ENCOUNTER — Emergency Department (HOSPITAL_COMMUNITY)
Admission: EM | Admit: 2016-12-09 | Discharge: 2016-12-09 | Disposition: A | Discharge status: Home / Self Care | Payer: PPO | Attending: Emergency Medicine | Admitting: Emergency Medicine

## 2016-12-09 DIAGNOSIS — R1013 Epigastric pain: Secondary | ICD-10-CM | POA: Diagnosis not present

## 2016-12-09 DIAGNOSIS — R1084 Generalized abdominal pain: Secondary | ICD-10-CM | POA: Diagnosis not present

## 2016-12-09 DIAGNOSIS — K5900 Constipation, unspecified: Secondary | ICD-10-CM

## 2016-12-09 DIAGNOSIS — J449 Chronic obstructive pulmonary disease, unspecified: Secondary | ICD-10-CM | POA: Diagnosis not present

## 2016-12-09 DIAGNOSIS — Z79899 Other long term (current) drug therapy: Secondary | ICD-10-CM | POA: Insufficient documentation

## 2016-12-09 DIAGNOSIS — I129 Hypertensive chronic kidney disease with stage 1 through stage 4 chronic kidney disease, or unspecified chronic kidney disease: Secondary | ICD-10-CM | POA: Diagnosis not present

## 2016-12-09 DIAGNOSIS — K5730 Diverticulosis of large intestine without perforation or abscess without bleeding: Secondary | ICD-10-CM | POA: Diagnosis not present

## 2016-12-09 DIAGNOSIS — Z96611 Presence of right artificial shoulder joint: Secondary | ICD-10-CM | POA: Insufficient documentation

## 2016-12-09 DIAGNOSIS — N183 Chronic kidney disease, stage 3 (moderate): Secondary | ICD-10-CM | POA: Diagnosis not present

## 2016-12-09 DIAGNOSIS — Z87891 Personal history of nicotine dependence: Secondary | ICD-10-CM | POA: Insufficient documentation

## 2016-12-09 DIAGNOSIS — J4541 Moderate persistent asthma with (acute) exacerbation: Secondary | ICD-10-CM | POA: Insufficient documentation

## 2016-12-09 DIAGNOSIS — Z96651 Presence of right artificial knee joint: Secondary | ICD-10-CM | POA: Insufficient documentation

## 2016-12-09 DIAGNOSIS — J4531 Mild persistent asthma with (acute) exacerbation: Secondary | ICD-10-CM | POA: Diagnosis not present

## 2016-12-09 LAB — I-STAT TROPONIN, ED: Troponin i, poc: 0 ng/mL (ref 0.00–0.08)

## 2016-12-09 LAB — CBC WITH DIFFERENTIAL/PLATELET
Basophils Absolute: 0 10*3/uL (ref 0.0–0.1)
Basophils Relative: 0 %
EOS ABS: 0 10*3/uL (ref 0.0–0.7)
Eosinophils Relative: 0 %
HCT: 36.5 % (ref 36.0–46.0)
HEMOGLOBIN: 11.5 g/dL — AB (ref 12.0–15.0)
LYMPHS ABS: 3.1 10*3/uL (ref 0.7–4.0)
Lymphocytes Relative: 34 %
MCH: 25.5 pg — ABNORMAL LOW (ref 26.0–34.0)
MCHC: 31.5 g/dL (ref 30.0–36.0)
MCV: 80.9 fL (ref 78.0–100.0)
Monocytes Absolute: 0.7 10*3/uL (ref 0.1–1.0)
Monocytes Relative: 8 %
NEUTROS PCT: 58 %
Neutro Abs: 5.4 10*3/uL (ref 1.7–7.7)
Platelets: 262 10*3/uL (ref 150–400)
RBC: 4.51 MIL/uL (ref 3.87–5.11)
RDW: 15.3 % (ref 11.5–15.5)
WBC: 9.2 10*3/uL (ref 4.0–10.5)

## 2016-12-09 LAB — URINALYSIS, ROUTINE W REFLEX MICROSCOPIC
Bacteria, UA: NONE SEEN
Bilirubin Urine: NEGATIVE
Glucose, UA: NEGATIVE mg/dL
KETONES UR: NEGATIVE mg/dL
Leukocytes, UA: NEGATIVE
Nitrite: NEGATIVE
PH: 5 (ref 5.0–8.0)
PROTEIN: NEGATIVE mg/dL
Specific Gravity, Urine: 1.02 (ref 1.005–1.030)

## 2016-12-09 LAB — COMPREHENSIVE METABOLIC PANEL
ALBUMIN: 4.1 g/dL (ref 3.5–5.0)
ALK PHOS: 52 U/L (ref 38–126)
ALT: 33 U/L (ref 14–54)
ANION GAP: 10 (ref 5–15)
AST: 29 U/L (ref 15–41)
BILIRUBIN TOTAL: 0.7 mg/dL (ref 0.3–1.2)
BUN: 22 mg/dL — ABNORMAL HIGH (ref 6–20)
CALCIUM: 9.4 mg/dL (ref 8.9–10.3)
CO2: 25 mmol/L (ref 22–32)
CREATININE: 1.78 mg/dL — AB (ref 0.44–1.00)
Chloride: 98 mmol/L — ABNORMAL LOW (ref 101–111)
GFR calc non Af Amer: 27 mL/min — ABNORMAL LOW (ref >60)
GFR, EST AFRICAN AMERICAN: 31 mL/min — AB (ref >60)
Glucose, Bld: 115 mg/dL — ABNORMAL HIGH (ref 65–99)
Potassium: 4.1 mmol/L (ref 3.5–5.1)
SODIUM: 133 mmol/L — AB (ref 135–145)
TOTAL PROTEIN: 7.1 g/dL (ref 6.5–8.1)

## 2016-12-09 LAB — I-STAT CG4 LACTIC ACID, ED: LACTIC ACID, VENOUS: 1.2 mmol/L (ref 0.5–1.9)

## 2016-12-09 LAB — LIPASE, BLOOD: Lipase: 23 U/L (ref 11–51)

## 2016-12-09 MED ORDER — IPRATROPIUM-ALBUTEROL 0.5-2.5 (3) MG/3ML IN SOLN
3.0000 mL | Freq: Once | RESPIRATORY_TRACT | Status: AC
  Administered 2016-12-09: 3 mL via RESPIRATORY_TRACT
  Filled 2016-12-09: qty 3

## 2016-12-09 MED ORDER — MAGNESIUM CITRATE PO SOLN: 1.0000 | Freq: Once | ORAL | 0 refills | Status: AC

## 2016-12-09 MED ORDER — MORPHINE SULFATE (PF) 2 MG/ML IV SOLN
2.0000 mg | Freq: Once | INTRAVENOUS | Status: AC
  Administered 2016-12-09: 2 mg via INTRAVENOUS
  Filled 2016-12-09: qty 1

## 2016-12-09 MED ORDER — SODIUM CHLORIDE 0.9 % IV SOLN
Freq: Once | INTRAVENOUS | Status: AC
  Administered 2016-12-09: 03:00:00 via INTRAVENOUS

## 2016-12-09 MED ORDER — ONDANSETRON HCL 4 MG/2ML IJ SOLN
4.0000 mg | Freq: Once | INTRAMUSCULAR | Status: AC
  Administered 2016-12-09: 4 mg via INTRAVENOUS
  Filled 2016-12-09: qty 2

## 2016-12-09 MED ORDER — HYDROMORPHONE HCL 1 MG/ML IJ SOLN
0.5000 mg | Freq: Once | INTRAMUSCULAR | Status: AC
  Administered 2016-12-09: 0.5 mg via INTRAVENOUS
  Filled 2016-12-09: qty 1

## 2016-12-09 MED ORDER — DOCUSATE SODIUM 100 MG PO CAPS: 100.0000 mg | ORAL_CAPSULE | Freq: Two times a day (BID) | ORAL | 0 refills | Status: DC

## 2016-12-09 MED ORDER — POLYETHYLENE GLYCOL 3350 17 G PO PACK: 17.0000 g | PACK | Freq: Two times a day (BID) | ORAL | 0 refills | Status: DC

## 2016-12-09 MED ORDER — MORPHINE SULFATE (PF) 4 MG/ML IV SOLN
4.0000 mg | Freq: Once | INTRAVENOUS | Status: AC
  Administered 2016-12-09: 4 mg via INTRAVENOUS
  Filled 2016-12-09: qty 1

## 2016-12-09 NOTE — ED Provider Notes (Signed)
[place] DEPT Provider Note   CSN: [id number] Arrival date & time: 12/09/16  0252     History   Chief Complaint Chief Complaint  Patient presents with  . Abdominal Pain    HPI Glenda Terry is a 76 y.o. female.  Patient presents to the emergency department for evaluation of abdominal pain. Patient reports that pain began 4 days ago. Initially pain was on the left lower side of her abdomen, now is across the upper abdomen and diffuse. Pain is sharp and crampy, continuous, now severe. It does radiate up to the chest and jawoccasionally. Patient reports a history of asthma, appears to be short of breath at arrival, but reports that her breathing is about what it normally is.      Past Medical History:  Diagnosis Date  . Anemia NOV 2013  . Anxiety   . Arthritis    inflammatory arthritis  . Asthma 1991   not COPD per [facility]  . Back pain, chronic   . Complication of anesthesia 1985   VITALS SIGNS DROPPED AND COULD NOT TAKE PAIN MEDS UNTIL VITALS IMPROVED  . COPD (chronic obstructive pulmonary disease) ([place])   . GERD (gastroesophageal reflux disease)   . Hypertension   . Tubular adenoma 11/2012    Patient Active Problem List   Diagnosis Date Noted  . Asthma exacerbation 09/20/2015  . Acute respiratory failure ([place]) 09/20/2015  . CKD (chronic kidney disease) stage 3, GFR 30-59 ml/min 09/20/2015  . Hemorrhoids, internal 08/21/2015  . 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 Microcytic anemia 11/01/2012  . FH: colon cancer 11/01/2012  . 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 Herniated lumbar intervertebral disc 04/20/2012  . Difficulty in walking(719.7) 03/13/2012  . Stiffness of joint, not elsewhere classified, lower leg 03/13/2012  . Weakness of right leg 03/13/2012  . OA (osteoarthritis) of knee 01/24/2012  . Anxiety 02/09/2011  . HTN (hypertension) 02/09/2011  . Arthritis 02/09/2011  . GERD  (gastroesophageal reflux disease) 02/09/2011    Past Surgical History:  Procedure Laterality Date  . ABDOMINAL HYSTERECTOMY    . BACK SURGERY    . CARPAL TUNNEL RELEASE Right 4/14  . CATARACT EXTRACTION W/PHACO Left 11/19/2014   Procedure: CATARACT EXTRACTION PHACO AND INTRAOCULAR LENS PLACEMENT (IOC);  Surgeon: Williams Che, MD;  Location: [facility];  Service: Ophthalmology;  Laterality: Left;  CDE: 4.77  . CATARACT EXTRACTION W/PHACO Right 02/03/2015   Procedure: CATARACT EXTRACTION PHACO AND INTRAOCULAR LENS PLACEMENT ([place]);  Surgeon: Williams Che, MD;  Location: [facility];  Service: Ophthalmology;  Laterality: Right;  CDE: 4.54  . COLONOSCOPY  06/19/2008   RMR: tortuous and elongated colon with scattered left-sided diverticula/colonic mucosa appeared entirely normal. Prior colonic ulcers had resolved.  . COLONOSCOPY  12/2007   Dr. Gala Romney: Scattered diffuse sigmoid diverticula, 2 areas of ulceration at the hepatic flexure. Biopsies unremarkable.  . COLONOSCOPY N/A 11/20/2012   next TCS 11/2017  . DECOMPRESSIVE LUMBAR LAMINECTOMY LEVEL 2  04/20/2012   Procedure: DECOMPRESSIVE LUMBAR LAMINECTOMY LEVEL 2;  Surgeon: Tobi Bastos, MD;  Location: [facility];  Service: Orthopedics;  Laterality: Right;  Decompressive Lumbar Laminectomy of the L4 - L5 and L5 - S1 Complete/Laminectomy L5 on the Right (X-Ray)  . ESOPHAGOGASTRODUODENOSCOPY  12/2007   Dr. 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 Small hiatal hernia. Slightly pale duodenal mucosa (biopsy unremarkable)  . KNEE ARTHROSCOPY Right   . REVERSE SHOULDER ARTHROPLASTY Right 05/24/2014  Procedure: RIGHT SHOULDER REVERSE ARTHROPLASTY;  Surgeon: Netta Cedars, MD;  Location: [place];  Service: Orthopedics;  Laterality: Right;  . TOTAL KNEE ARTHROPLASTY  01/24/2012   Procedure: TOTAL KNEE ARTHROPLASTY;  Surgeon: Gearlean Alf, MD;  Location: [facility];  Service: Orthopedics;  Laterality: Right;  . TUBAL  LIGATION      OB History    Gravida Para Term Preterm AB Living   3 3 2 1   3    SAB TAB Ectopic Multiple Live Births                   Home Medications    Prior to Admission medications   Medication Sig Start Date End Date Taking? Authorizing Provider  albuterol (PROAIR HFA) 108 (90 BASE) MCG/ACT inhaler Inhale 2 puffs into the lungs every 6 (six) hours as needed for wheezing or shortness of breath. For COPD    [provider]  albuterol (PROVENTIL) (2.5 MG/3ML) 0.083% nebulizer solution Take 3 mLs (2.5 mg total) by nebulization every 4 (four) hours as needed for wheezing or shortness of breath. Patient not taking: Reported on 01/20/2016 09/25/15   Kathie Dike, MD  ALPRAZolam Duanne Moron) 0.5 MG tablet Take 0.5 mg by mouth 4 (four) times daily as needed for anxiety. For anxiety    [provider]  azithromycin (ZITHROMAX) 250 MG tablet Take 250 mg by mouth daily.     [provider]  budesonide-formoterol (SYMBICORT) 160-4.5 MCG/ACT inhaler Inhale 2 puffs into the lungs 2 (two) times daily.    [provider]  cetirizine (ZYRTEC) 10 MG tablet Take 10 mg by mouth daily.    [provider]  docusate sodium (COLACE) 100 MG capsule Take 1 capsule (100 mg total) by mouth every 12 (twelve) hours. 12/09/16   Orpah Greek, MD  EPINEPHrine (EPIPEN 2-PAK) 0.3 mg/0.3 mL DEVI Inject 0.3 mg into the muscle once. Reported on 09/26/2015    [provider]  ferrous sulfate 325 (65 FE) MG tablet Take 325 mg by mouth daily with breakfast.    [provider]  fluticasone (FLONASE) 50 MCG/ACT nasal spray Place 1 spray into both nostrils 2 (two) times daily.    [provider]  folic acid (FOLVITE) 1 MG tablet Take 1 mg by mouth daily.    [provider]  guaiFENesin (MUCINEX) 600 MG 12 hr tablet Take 1 tablet (600 mg total) by mouth 2 (two) times daily. Patient not taking: Reported on 12/23/2015 09/25/15   Kathie Dike,  MD  hydrocortisone (PROCTOZONE-HC) 2.5 % rectal cream Place 1 application rectally 2 (two) times daily. Patient not taking: Reported on 01/20/2016 08/21/15   Mahala Menghini, PA-C  ipratropium-albuterol (DUONEB) 0.5-2.5 (3) MG/3ML SOLN Take 3 mLs by nebulization every 6 (six) hours as needed.    [provider]  magnesium citrate SOLN Take 296 mLs (1 Bottle total) by mouth once. 12/09/16 12/09/16  Orpah Greek, MD  magnesium oxide (MAG-OX) 400 MG tablet Take 400 mg by mouth 2 (two) times daily.  01/16/16   [provider]  montelukast (SINGULAIR) 10 MG tablet Take 10 mg by mouth at bedtime.  04/12/15   [provider]  pantoprazole (PROTONIX) 40 MG tablet Take 40 mg by mouth at bedtime.     [provider]  polyethylene glycol (MIRALAX / GLYCOLAX) packet Take 17 g by mouth 2 (two) times daily. Until bowel movement 12/09/16   Orpah Greek, MD  predniSONE (DELTASONE) 10 MG tablet  Take 10 mg by mouth daily with breakfast.  01/16/16   [provider]  telmisartan-hydrochlorothiazide (MICARDIS HCT) 80-12.5 MG tablet Take 1 tablet by mouth daily.    [provider]    Family History Family History  Problem Relation Age of Onset  . Breast cancer Sister   . Colon cancer Sister        two deceased, one living and terminal, one current undergoing treatment, ages 33, 32, 57, 34    Social History Social History  Substance Use Topics  . Smoking status: Former Smoker    Packs/day: 2.00    Years: 40.00    Types: Cigarettes    Quit date: 01/19/1992  . Smokeless tobacco: Never Used  . Alcohol use No     Allergies   Flexeril [cyclobenzaprine]; Other; Keflex [cephalexin]; Aspirin; Statins; Adhesive [tape]; and Chlorhexidine   Review of Systems Review of Systems  Respiratory: Positive for cough and shortness of breath.   Gastrointestinal: Positive for abdominal pain.  All other systems reviewed and are negative.    Physical  Exam Updated Vital Signs BP 133/71   Pulse 91   Temp 98.2 F (36.8 C)   Resp 15   Ht 5\' 2"  (1.575 m)   Wt 94.3 kg (208 lb)   SpO2 94%   BMI 38.04 kg/m   Physical Exam  Constitutional: She is oriented to person, place, and time. She appears well-developed and well-nourished. No distress.  HENT:  Head: Normocephalic and atraumatic.  Right Ear: Hearing normal.  Left Ear: Hearing normal.  Nose: Nose normal.  Mouth/Throat: Oropharynx is clear and moist and mucous membranes are normal.  Eyes: Pupils are equal, round, and reactive to light. Conjunctivae and EOM are normal.  Neck: Normal range of motion. Neck supple.  Cardiovascular: Regular rhythm, S1 normal and S2 normal.  Tachycardia present.  Exam reveals no gallop and no friction rub.   No murmur heard. Pulmonary/Chest: Effort normal. No respiratory distress. She has decreased breath sounds. She has wheezes. She exhibits no tenderness.  Abdominal: Soft. Normal appearance and bowel sounds are normal. There is no hepatosplenomegaly. There is tenderness in the epigastric area and left lower quadrant. There is no rebound, no guarding, no tenderness at McBurney's point and negative Murphy's sign. No hernia.  Musculoskeletal: Normal range of motion.  Neurological: She is alert and oriented to person, place, and time. She has normal strength. No cranial nerve deficit or sensory deficit. Coordination normal. GCS eye subscore is 4. GCS verbal subscore is 5. GCS motor subscore is 6.  Skin: Skin is warm, dry and intact. No rash noted. No cyanosis.  Psychiatric: She has a normal mood and affect. Her speech is normal and behavior is normal. Thought content normal.  Nursing note and vitals reviewed.    ED Treatments / Results  Labs (all labs ordered are listed, but only abnormal results are displayed) Labs Reviewed  CBC WITH DIFFERENTIAL/PLATELET - Abnormal; Notable for the following:       Result Value   Hemoglobin 11.5 (*)    MCH 25.5 (*)     All other components within normal limits  COMPREHENSIVE METABOLIC PANEL - Abnormal; Notable for the following:    Sodium 133 (*)    Chloride 98 (*)    Glucose, Bld 115 (*)    BUN 22 (*)    Creatinine, Ser 1.78 (*)    GFR calc non Af Amer 27 (*)    GFR calc Af Amer 31 (*)  All other components within normal limits  URINALYSIS, ROUTINE W REFLEX MICROSCOPIC - Abnormal; Notable for the following:    APPearance HAZY (*)    Hgb urine dipstick SMALL (*)    Squamous Epithelial / LPF 0-5 (*)    All other components within normal limits  LIPASE, BLOOD  I-STAT CG4 LACTIC ACID, ED  I-STAT TROPONIN, ED    EKG  EKG Interpretation  Date/Time:  Thursday December 09 2016 03:05:16 EDT Ventricular Rate:  106 PR Interval:    QRS Duration: 82 QT Interval:  340 QTC Calculation: 452 R Axis:   16 Text Interpretation:  Sinus tachycardia Ventricular premature complex Otherwise within normal limits Confirmed by Orpah Greek [phone]) on 12/09/2016 3:12:38 AM       Radiology Ct Abdomen Pelvis Wo Contrast  Result Date: 12/09/2016 CLINICAL DATA:  Lower abdominal pain. EXAM: CT ABDOMEN AND PELVIS WITHOUT CONTRAST TECHNIQUE: Multidetector CT imaging of the abdomen and pelvis was performed following the standard protocol without IV contrast. COMPARISON:  CT 05/05/2015 FINDINGS: Lower chest: Breathing motion artifact. Small to moderate hiatal hernia, increased from prior exam. Hepatobiliary: Low-density lesion in the right lobe of the liver is not well characterized on the current exam without IV contrast. Gallbladder physiologically distended, no calcified stone. No biliary dilatation. Pancreas: No ductal dilatation or inflammation. Spleen: Normal in size without focal abnormality. Adrenals/Urinary Tract: No adrenal nodule. The kidneys are symmetric in size without stones or hydronephrosis. There is no perinephric stranding. Both ureters are decompressed without stones along the course. Urinary  bladder is completely decompressed and not well assessed. Stomach/Bowel: Small to moderate hiatal hernia, increased in size from prior CT. Stomach is nondistended. No small bowel obstruction or inflammation. Normal appendix. Large colonic stool burden in the proximal colon, with moderate stool distally. Diverticulosis of the descending and sigmoid colon without diverticulitis. Vascular/Lymphatic: Aortic atherosclerosis. Scattered upper abdominal calcifications are likely calcified lymph nodes. No suspicious adenopathy. Reproductive: Status post hysterectomy. No adnexal masses. Other: No free air, free fluid, or intra-abdominal fluid collection. Tiny fat containing umbilical hernia. Musculoskeletal: L1 superior endplate prominent Schmorl's node versus mild compression fracture, new from prior exam scoliotic curvature multilevel degenerative change in the spine. IMPRESSION: 1. No acute abnormality in the abdomen/pelvis or explanation for abdominal pain. 2. Colonic diverticulosis without diverticulitis. 3. Small to moderate hiatal hernia, increased in size from prior. 4.  Aortic Atherosclerosis (ICD10-I70.0). 5. Mild L1 superior endplate Schmorl's node versus mild compression fracture, age indeterminate but new from CT 18 months prior. Electronically Signed   By: Jeb Levering M.D.   On: 12/09/2016 06:38   Dg Abd Acute W/chest  Result Date: 12/09/2016 CLINICAL DATA:  Epigastric pain radiating to the left flank and jaw. Dyspnea. EXAM: DG ABDOMEN ACUTE W/ 1V CHEST COMPARISON:  CXR from 10/28/2016 FINDINGS: Heart size is normal. No aortic aneurysm. No pneumonic consolidation or pneumothorax. No overt pulmonary edema nor effusion. There is minimal atelectasis at the left lung base. The patient is status post right reverse shoulder arthroplasty. There is dextro convex scoliosis the upper lumbar spine. A moderate amount of stool is seen within the right colon. No bowel obstruction or free air. No radiopaque calculi.  Small phlebolith is seen left hemipelvis. IMPRESSION: Increased colonic stool burden within the right colon. No bowel obstruction or free air. No acute cardiopulmonary disease. Electronically Signed   By: Ashley Royalty M.D.   On: 12/09/2016 03:54    Procedures Procedures (including critical care time)  Medications Ordered in ED  Medications  morphine 2 MG/ML injection 2 mg (2 mg Intravenous Given 12/09/16 0323)  ondansetron (ZOFRAN) injection 4 mg (4 mg Intravenous Given 12/09/16 0323)  0.9 %  sodium chloride infusion ( Intravenous Stopped 12/09/16 0531)  morphine 4 MG/ML injection 4 mg (4 mg Intravenous Given 12/09/16 0412)  ipratropium-albuterol (DUONEB) 0.5-2.5 (3) MG/3ML nebulizer solution 3 mL (3 mLs Nebulization Given 12/09/16 0420)  HYDROmorphone (DILAUDID) injection 0.5 mg (0.5 mg Intravenous Given 12/09/16 0529)     Initial Impression / Assessment and Plan / ED Course  I have reviewed the triage vital signs and the nursing notes.  Pertinent labs & imaging results that were available during my care of the patient were reviewed by me and considered in my medical decision making (see chart for details).     Patient presents to the ER for evaluation of abdominal pain. Patient reports that the symptoms of been ongoing for 4 days. Initially pain was in the left lower quadrant, now is more generalized in upper midline. She has not had associated nausea, vomiting or diarrhea.patient has diffuse tenderness on examination. She does report that the pain radiated up to the chest and neck area at times over the last 4 days, EKG and troponin are negative. She is not experiencing any chest pain, jaw pain, etc. currently.  Patient's blood work is normal. Lain film x-ray showed moderate colonic stool burden, no other acute abnormality. It was felt that the patient would require CT scan to further evaluate. Creatinine is elevated, has had some chronic renal insufficiency in the past. She could not get IV  contrast. Unfortunately, CT scan was broken and patient required transport to [facility] scan. CT scan has been performed and does not show any acute pathology.  Patient noted to be exhibiting some wheezing and appeared slightly short of breath upon arrival. She reports, however, that her breathing is always like this, she has not experienced any worsening. She was treated with a DuoNeb for her bronchospasm.  Final Clinical Impressions(s) / ED Diagnoses   Final diagnoses:  Generalized abdominal pain  Moderate persistent asthma with acute exacerbation  Constipation, unspecified constipation type    New Prescriptions New Prescriptions   DOCUSATE SODIUM (COLACE) 100 MG CAPSULE    Take 1 capsule (100 mg total) by mouth every 12 (twelve) hours.   MAGNESIUM CITRATE SOLN    Take 296 mLs (1 Bottle total) by mouth once.   POLYETHYLENE GLYCOL (MIRALAX / GLYCOLAX) PACKET    Take 17 g by mouth 2 (two) times daily. Until bowel movement     Pollina, Gwenyth Allegra, MD 12/09/16 [phone]

## 2016-12-09 NOTE — Patient Outreach (Signed)
[facility]) Care Management  12/09/2016  Glenda Terry Dec 23, 1940 [id number]   Telephone Screen  Referral Date:12/06/16 Referral Source: Nurse Call Center Report Referral Reason: "caller states she is having stabbing lower abd pains on left side, no diarrhea or fever, symptoms for a week and worse this morning" Insurance:HTA  Outreach attempt #3 to patient. No answer at present. Noted in patient's chart she went to the ER was treated and released earlier this morning for her abd pain and determined to have "colonic stool burden."     Plan: RN CM will send unsuccessful outreach letter to patient and close case if no response from patient within 10 business days.   Enzo Montgomery, RN,BSN,CCM [place] Management Telephonic Care Management Coordinator Direct Phone: [phone] Toll Free: [phone] Fax: [phone]

## 2016-12-09 NOTE — ED Notes (Signed)
Received call from [facility] stating pt is on her way back here.

## 2016-12-09 NOTE — ED Notes (Signed)
Pt ambulatory to waiting room. Pt verbalized understanding of discharge instructions.   

## 2016-12-09 NOTE — ED Notes (Signed)
Pt picked up by [facility] for [facility].

## 2016-12-09 NOTE — ED Triage Notes (Signed)
Pt c/o epigastric pain that radiates to the left flank and jaw. Pt is sob during triage but states she is asmathic.

## 2016-12-09 NOTE — ED Notes (Signed)
Patient transported to X-ray 

## 2016-12-10 ENCOUNTER — Other Ambulatory Visit: Payer: Self-pay

## 2016-12-10 ENCOUNTER — Telehealth: Payer: Self-pay | Admitting: Internal Medicine

## 2016-12-10 NOTE — Patient Outreach (Signed)
[facility]) Care Management  12/10/2016  Glenda Terry April 14, 1940 [id number]   Telephone Screen  Referral Date:12/06/16 Referral Source: Nurse Call Center Report Referral Reason: "caller states she is having stabbing lower abd pains on left side, no diarrhea or fever, symptoms for a week and worse this morning" Insurance:HTA    Message received from [place] that patient called the office returning RN CM call. Call placed to patient. Spoke with patient. She voices that she did go to the ER and was able to get "part" of her issues resolved. She states she was told that based on scans she was constipated. She was given meds to take and has since had a good BM and constipation issue has resolved. Patient states that she also saw PCP earlier this week for her continued lower abd pain to side. She voices that she was told that she had a pulled muscle. She voices that she was at alterations place trying on a skirt to get altered and loss her balance standing on one leg as she forgot her cane in her car and fell down. She voices that she pulled her muscle when going down. She has been applying pain and some OTC analgesic patches to area. She voices that she has Voltaren gel in the home that she applies to her areas of arthritis and was wondering if she could use it to affected area. Encouraged patient to consult with PCP regarding this. She voices that she goes back to MD on 12/23/16 but will call their office to see what he recommends she take for pain. She voices she has Tylenol tabs in the home and will take some of them. However, she is hesitant regarding taking pain meds due to constipation issues. RN CM educated patient on daily bowel regimen and she voices she has Miralax to take daily. No further RN CM needs or concerns at this time. Patient was appreciative of f/u call.       Plan: RN CM will notify [facility] administrative assistant of case status.   Enzo Montgomery,  RN,BSN,CCM [facility] Telephonic Care Management Coordinator Direct Phone: [phone] Toll Free: [phone] Fax: [phone]

## 2016-12-10 NOTE — Telephone Encounter (Signed)
Pt called to make OV. She was seen in the ER recently for constipation. She wanted to see RMR and is aware of OV on 10/23 at 8. She is on a cancellation list to be moved to sooner if possible. She asked what is she suppose to do in the meantime. I told her the nurse wasn't here today and would be back on Monday. She would like for the nurse to call her Monday with recommendations. [phone]

## 2016-12-13 NOTE — Telephone Encounter (Signed)
Looks like RMR has a Optician, dispensing. Erline Levine, will you see if she can come tomorrow?

## 2016-12-13 NOTE — Telephone Encounter (Signed)
Called patient and she is coming tomorrow at 2

## 2016-12-14 ENCOUNTER — Telehealth: Payer: Self-pay

## 2016-12-14 ENCOUNTER — Other Ambulatory Visit: Payer: Self-pay

## 2016-12-14 ENCOUNTER — Ambulatory Visit (INDEPENDENT_AMBULATORY_CARE_PROVIDER_SITE_OTHER): Payer: PPO | Admitting: Internal Medicine

## 2016-12-14 ENCOUNTER — Encounter: Payer: Self-pay | Admitting: Internal Medicine

## 2016-12-14 VITALS — BP 123/77 | HR 124 | Temp 97.0°F | Ht 60.0 in | Wt 204.2 lb

## 2016-12-14 DIAGNOSIS — K219 Gastro-esophageal reflux disease without esophagitis: Secondary | ICD-10-CM

## 2016-12-14 DIAGNOSIS — R1084 Generalized abdominal pain: Secondary | ICD-10-CM

## 2016-12-14 DIAGNOSIS — K5909 Other constipation: Secondary | ICD-10-CM

## 2016-12-14 DIAGNOSIS — R1310 Dysphagia, unspecified: Secondary | ICD-10-CM | POA: Diagnosis not present

## 2016-12-14 DIAGNOSIS — R1319 Other dysphagia: Secondary | ICD-10-CM

## 2016-12-14 NOTE — Telephone Encounter (Signed)
Called and informed pt of pre-op appt 01/05/17 at 9:00am.

## 2016-12-14 NOTE — Progress Notes (Signed)
Primary Care Physician:  Celene Squibb, MD Primary Gastroenterologist:  Dr. Gala Romney  Pre-Procedure History & Physical: HPI:   Pleasant 76 year old lady with multiple comorbidities with chronic constipation. Poor bowel function in the past several days which the emergency room last week. CT of the abdomen without contrast demonstrated marked stool burn on the right side she's been given over-the-counter laxatives with modest improvement in her symptoms with modest results. Colonoscopy 2014 demonstrated no significant abnormalities. Positive family history colon cancer. ED evaluation last week demonstrated a normal lipase and LFTs good. White count not elevated mild anemia 11.5 elevated creatinine-chronic finding. CT report last week did entertain the possibility of a small T12 compression fracture. Patient did fall recently.    Past Medical History:  Diagnosis Date  . Anemia NOV 2013  . Anxiety   . Arthritis    inflammatory arthritis  . Asthma 1991   not COPD per [facility]  . Back pain, chronic   . Complication of anesthesia 1985   VITALS SIGNS DROPPED AND COULD NOT TAKE PAIN MEDS UNTIL VITALS IMPROVED  . COPD (chronic obstructive pulmonary disease) ([place])   . GERD (gastroesophageal reflux disease)   . Hypertension   . Tubular adenoma 11/2012    Past Surgical History:  Procedure Laterality Date  . ABDOMINAL HYSTERECTOMY    . BACK SURGERY    . CARPAL TUNNEL RELEASE Right 4/14  . CATARACT EXTRACTION W/PHACO Left 11/19/2014   Procedure: CATARACT EXTRACTION PHACO AND INTRAOCULAR LENS PLACEMENT (IOC);  Surgeon: Williams Che, MD;  Location: [facility];  Service: Ophthalmology;  Laterality: Left;  CDE: 4.77  . CATARACT EXTRACTION W/PHACO Right 02/03/2015   Procedure: CATARACT EXTRACTION PHACO AND INTRAOCULAR LENS PLACEMENT ([place]);  Surgeon: Williams Che, MD;  Location: [facility];  Service: Ophthalmology;  Laterality: Right;  CDE: 4.54  . COLONOSCOPY  06/19/2008   RMR: tortuous  and elongated colon with scattered left-sided diverticula/colonic mucosa appeared entirely normal. Prior colonic ulcers had resolved.  . COLONOSCOPY  12/2007   Dr. Gala Romney: Scattered diffuse sigmoid diverticula, 2 areas of ulceration at the hepatic flexure. Biopsies unremarkable.  . COLONOSCOPY N/A 11/20/2012   next TCS 11/2017  . DECOMPRESSIVE LUMBAR LAMINECTOMY LEVEL 2  04/20/2012   Procedure: DECOMPRESSIVE LUMBAR LAMINECTOMY LEVEL 2;  Surgeon: Tobi Bastos, MD;  Location: [facility];  Service: Orthopedics;  Laterality: Right;  Decompressive Lumbar Laminectomy of the L4 - L5 and L5 - S1 Complete/Laminectomy L5 on the Right (X-Ray)  . ESOPHAGOGASTRODUODENOSCOPY  12/2007   Dr. 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 Small hiatal hernia. Slightly pale duodenal mucosa (biopsy unremarkable)  . KNEE ARTHROSCOPY Right   . REVERSE SHOULDER ARTHROPLASTY Right 05/24/2014   Procedure: RIGHT SHOULDER REVERSE ARTHROPLASTY;  Surgeon: Netta Cedars, MD;  Location: [place];  Service: Orthopedics;  Laterality: Right;  . TOTAL KNEE ARTHROPLASTY  01/24/2012   Procedure: TOTAL KNEE ARTHROPLASTY;  Surgeon: Gearlean Alf, MD;  Location: [facility];  Service: Orthopedics;  Laterality: Right;  . TUBAL LIGATION      Prior to Admission medications   Medication Sig Start Date End Date Taking? Authorizing Provider  albuterol (PROAIR HFA) 108 (90 BASE) MCG/ACT inhaler Inhale 2 puffs into the lungs every 6 (six) hours as needed for wheezing or shortness of breath. For COPD   Yes [provider]  ALPRAZolam (XANAX) 0.5 MG tablet Take 0.5 mg by mouth 4 (four) times daily as needed for anxiety. For anxiety  Yes [provider]  azithromycin (ZITHROMAX) 250 MG tablet Take 250 mg by mouth daily.    Yes [provider]  Benralizumab (FASENRA) 30 MG/ML SOSY Inject into the skin every 8 (eight) weeks.   Yes [provider]  budesonide-formoterol  (SYMBICORT) 160-4.5 MCG/ACT inhaler Inhale 2 puffs into the lungs 2 (two) times daily.   Yes [provider]  cetirizine (ZYRTEC) 10 MG tablet Take 10 mg by mouth daily.   Yes [provider]  EPINEPHrine (EPIPEN 2-PAK) 0.3 mg/0.3 mL DEVI Inject 0.3 mg into the muscle once. Reported on 09/26/2015   Yes [provider]  ferrous sulfate 325 (65 FE) MG tablet Take 325 mg by mouth daily with breakfast.   Yes [provider]  fluticasone (FLONASE) 50 MCG/ACT nasal spray Place 1 spray into both nostrils 2 (two) times daily.   Yes [provider]  folic acid (FOLVITE) 1 MG tablet Take 1 mg by mouth daily.   Yes [provider]  ipratropium-albuterol (DUONEB) 0.5-2.5 (3) MG/3ML SOLN Take 3 mLs by nebulization every 6 (six) hours as needed.   Yes [provider]  montelukast (SINGULAIR) 10 MG tablet Take 10 mg by mouth at bedtime.  04/12/15  Yes [provider]  olmesartan (BENICAR) 40 MG tablet Take 40 mg by mouth daily.   Yes [provider]  pantoprazole (PROTONIX) 40 MG tablet Take 40 mg by mouth at bedtime.    Yes [provider]  polyethylene glycol (MIRALAX / GLYCOLAX) packet Take 17 g by mouth 2 (two) times daily. Until bowel movement Patient taking differently: Take 17 g by mouth daily as needed. Until bowel movement 12/09/16  Yes Pollina, Gwenyth Allegra, MD  potassium chloride (K-DUR) 10 MEQ tablet Take 10 mEq by mouth daily. To take daily every other week 11/16/16  Yes [provider]  predniSONE (DELTASONE) 10 MG tablet Take 10 mg by mouth daily with breakfast.  01/16/16  Yes [provider]  torsemide (DEMADEX) 10 MG tablet Take 1 tablet by mouth daily. Takes daily every other week 11/12/16  Yes [provider]  albuterol (PROVENTIL) (2.5 MG/3ML) 0.083% nebulizer solution Take 3 mLs (2.5 mg total) by nebulization every 4 (four) hours as needed for wheezing or shortness of breath. Patient  not taking: Reported on 01/20/2016 09/25/15   Kathie Dike, MD  docusate sodium (COLACE) 100 MG capsule Take 1 capsule (100 mg total) by mouth every 12 (twelve) hours. Patient not taking: Reported on 12/14/2016 12/09/16   Orpah Greek, MD  guaiFENesin (MUCINEX) 600 MG 12 hr tablet Take 1 tablet (600 mg total) by mouth 2 (two) times daily. Patient not taking: Reported on 12/23/2015 09/25/15   Kathie Dike, MD  hydrocortisone (PROCTOZONE-HC) 2.5 % rectal cream Place 1 application rectally 2 (two) times daily. Patient not taking: Reported on 01/20/2016 08/21/15   Mahala Menghini, PA-C  magnesium oxide (MAG-OX) 400 MG tablet Take 400 mg by mouth 2 (two) times daily.  01/16/16   [provider]  telmisartan-hydrochlorothiazide (MICARDIS HCT) 80-12.5 MG tablet Take 1 tablet by mouth daily.    [provider]    Allergies as of 12/14/2016 - Review Complete 12/14/2016  Allergen Reaction Noted  . Flexeril [cyclobenzaprine] Anaphylaxis 01/12/2012  . Other Anaphylaxis 02/08/2011  . Keflex [cephalexin]  05/26/2012  . Aspirin  05/05/2015  . Statins Other (See Comments) 05/17/2011  . Adhesive [tape] Rash 11/01/2014  . Chlorhexidine Rash 11/19/2014    Family History  Problem  Relation Age of Onset  . Breast cancer Sister   . Colon cancer Sister        two deceased, one living and terminal, one current undergoing treatment, ages 55, 69, 71, 25    Social History   Social History  . Marital status: Single    Spouse name: N/A  . Number of children: 3  . Years of education: N/A   Occupational History  . Not on file.   Social History Main Topics  . Smoking status: Former Smoker    Packs/day: 2.00    Years: 40.00    Types: Cigarettes    Quit date: 01/19/1992  . Smokeless tobacco: Never Used  . Alcohol use No  . Drug use: No  . Sexual activity: No   Other Topics Concern  . Not on file   Social History Narrative  . No narrative on file    Review of  Systems: See HPI, otherwise negative ROS  Physical Exam: BP 123/77   Pulse (!) 124   Temp (!) 97 F (36.1 C) (Oral)   Ht 5' (1.524 m)   Wt 204 lb 3.2 oz (92.6 kg)   BMI 39.88 kg/m  General:   Alert,   pleasant and cooperative in NAD. Complains of bloating. Lungs:  Clear throughout to auscultation.   No wheezes, crackles, or rhonchi. No acute distress. Heart:  Regular rate and rhythm; no murmurs, clicks, rubs,  or gallops. Abdomen: Rotund/obese.  normal bowel sounds.  Mild diffuse tenderness to palpation. No appreciable mass or organomegaly.  Pulses:  Normal pulses noted. Extremities:  Without clubbing or edema. Rectal: Good sphincter tone. No impaction no mass rectal vault scant brown stool Hemoccult negative.   Impression:  Pleasant 76 year old lady with abdominal pain the setting of constipation. ED evaluation last week unrevealing for any other processes. Currently patient is not appear acutely ill or toxic. She does appear uncomfortable. I suspect she needs a good colonic purge with a chronic, more aggressive bowel program. I doubt at this point she has mesenteric ischemia or other surgical process. Positive family history of colon cancer; due for repeat colonoscopy in one year.   She has recurrent esophageal dysphagia and breakthrough GERD symptoms on twice a day PPI therapy. History of a cervical esophageal web dilated previously.    Recommendations: Take movie prep overnight to clean your colon out; call us tomorrow and let us know what kind of results she had from this treatment  Depending on progress, may need further evaluation  To further evaluate GERD and dysphagia further, will plan for an EGD with esophageal dilation under propofol ,given multiple comorbidities in 2-3 weeks from now.  The risks, benefits, limitations, alternatives and imponderables have been reviewed with the patient. Potential for esophageal dilation, biopsy, etc. have also been reviewed.  Questions  have been answered. All parties agreeable.  Further recommendations to follow.    Notice: This dictation was prepared with Dragon dictation along with smaller phrase technology. Any transcriptional errors that result from this process are unintentional and may not be corrected upon review.

## 2016-12-14 NOTE — Patient Instructions (Signed)
Take movie prep overnight to clean your colon out; call us tomorrow and let us know what kind of results she had from this treatment  Depending on progress, may need further evaluation  To further evaluate GERD and dysphagia further, will plan for an EGD with esophageal dilation under propofol in 2-3 weeks from now  Further recommendations to follow.

## 2016-12-15 ENCOUNTER — Telehealth: Payer: Self-pay | Admitting: Internal Medicine

## 2016-12-15 NOTE — Telephone Encounter (Signed)
Pt called with PR- routing to RMR

## 2016-12-15 NOTE — Telephone Encounter (Signed)
Pt is aware. She said she would come in the morning and pick them up. She said she was feeling much better now.

## 2016-12-15 NOTE — Telephone Encounter (Signed)
Progress noted. Let's begin Linzess 290 once daily. Please provide samples 2 weeks.

## 2016-12-15 NOTE — Telephone Encounter (Signed)
Pt said she was told to call us this morning if she was feeling better. She said that she was feeling a little better, but both her sides were still hurting.

## 2016-12-16 DIAGNOSIS — M961 Postlaminectomy syndrome, not elsewhere classified: Secondary | ICD-10-CM | POA: Diagnosis not present

## 2016-12-20 ENCOUNTER — Telehealth: Payer: Self-pay

## 2016-12-20 NOTE — Telephone Encounter (Signed)
Pt called office to reschedule EGD/Dil w/Propofol with RMR that was for 01/10/17. Her daughter wanted earlier time of day. Rescheduled to 01/20/17 at 7:30am. New instructions mailed. LMOVM and informed Endo Scheduler.  Endo Scheduler called office. Pre-op changed to 01/13/17 at 9:00am. Called and informed pt. Letter also mailed with procedure instructions.

## 2016-12-21 DIAGNOSIS — D509 Iron deficiency anemia, unspecified: Secondary | ICD-10-CM | POA: Diagnosis not present

## 2016-12-21 DIAGNOSIS — R7301 Impaired fasting glucose: Secondary | ICD-10-CM | POA: Diagnosis not present

## 2016-12-21 DIAGNOSIS — I10 Essential (primary) hypertension: Secondary | ICD-10-CM | POA: Diagnosis not present

## 2016-12-23 DIAGNOSIS — N183 Chronic kidney disease, stage 3 (moderate): Secondary | ICD-10-CM | POA: Diagnosis not present

## 2016-12-23 DIAGNOSIS — R7301 Impaired fasting glucose: Secondary | ICD-10-CM | POA: Diagnosis not present

## 2016-12-23 DIAGNOSIS — Z6838 Body mass index (BMI) 38.0-38.9, adult: Secondary | ICD-10-CM | POA: Diagnosis not present

## 2016-12-23 DIAGNOSIS — J441 Chronic obstructive pulmonary disease with (acute) exacerbation: Secondary | ICD-10-CM | POA: Diagnosis not present

## 2016-12-29 ENCOUNTER — Encounter: Payer: PPO | Attending: Internal Medicine | Admitting: Nutrition

## 2016-12-29 VITALS — Wt 203.0 lb

## 2016-12-29 DIAGNOSIS — E249 Cushing's syndrome, unspecified: Secondary | ICD-10-CM | POA: Diagnosis not present

## 2016-12-29 DIAGNOSIS — I10 Essential (primary) hypertension: Secondary | ICD-10-CM | POA: Insufficient documentation

## 2016-12-29 DIAGNOSIS — E669 Obesity, unspecified: Secondary | ICD-10-CM

## 2016-12-29 DIAGNOSIS — Z713 Dietary counseling and surveillance: Secondary | ICD-10-CM | POA: Diagnosis not present

## 2016-12-29 DIAGNOSIS — R7303 Prediabetes: Secondary | ICD-10-CM | POA: Diagnosis not present

## 2016-12-29 NOTE — Patient Instructions (Addendum)
Goals 1. Keep up great job!! 2, Increase fresh vegetables. 3. Increase fiber intake 4. No snacks between meals Lose 2-3 lbs per months. Use Glenda Terry

## 2016-12-29 NOTE — Progress Notes (Signed)
  Medical Nutrition Therapy:  Appt start time: 1500 end time:  1025.   Assessment:  Primary concerns today: Overweight, CKD Stg 3, HTN. Saw Dr. [name] Crane,  Labs. Went to ER about 2 weeks due to abdominal pressure and was afraid she was having a heart attack..  BP was elevated.  CT scan  Showed bowl blockage in top of bowel and was given meds to flush GI system.[place] 4 weeks ago. Had a fracture on back plus arthritis.. Scheduled to have EGD on Nov 8th.  Saw Orthopedic and will get steriod in back on 30th of Oct.  Los 17 lbs of fluid from last visit. She is also eating better balanced meals. Avoiding salt and processed foods. Has cut out snacking.   Breathing, BP and fluid is much better. She feels a lot better. Motivated to continue to lose weight and increase walking for weight loss as she is able with her breathing.  Takes Torsemide 1 pill every day for 7 days every other week and that has helped keep her fluid off.  Preferred Learning Style:   Auditory  Visual  Hands on  Learning Readiness:   Ready  Change in progress   MEDICATIONS: See list   DIETARY INTAKE:  24-hr recall:  B ( AM):1/2 c oatemeal, appleause, 1 egg and coffee Snk ( AM):   L ( PM): Greens, beans pinto, peaches, water Snk ( PM):  D ( PM): skipped Snk ( PM): same as dinner. Beverages: Water, sometimes soda.  Usual physical activity: ADL  Estimated energy needs: 1500  calories 170 g carbohydrates 112 g protein 42 g fat  Progress Towards Goal(s):  In progress.   Nutritional Diagnosis:  NB-1.1 Food and nutrition-related knowledge deficit As related to Diabetes.  As evidenced by A1C > 6.5%.    Intervention:  Nutrition and Pre-Diabetes education provided on a Low Salt Low Fat Diet using My Plate, CHO counting, meal planning, portion sizes, timing of meals, avoiding snacks between meals  taking medications as prescribed, benefits of exercising 30 minutes per day. Reviewed avoiding processed, high salt foods  and cutting out snacks.    Goals 1. Keep up great job!! 2, Increase fresh vegetables. 3. Increase fiber intake 4. No snacks between meals Lose 2-3 lbs per months. Use Mrs. Dash  Teaching Method Utilized:  Visual Auditory Hands on  Handouts given during visit include:  Low Salt Diet  My Plate Method    Barriers to learning/adherence to lifestyle change: none  Demonstrated degree of understanding via:  Teach Back   Monitoring/Evaluation:  Dietary intake, exercise, , and body weight in 3 month(s).

## 2016-12-31 DIAGNOSIS — J301 Allergic rhinitis due to pollen: Secondary | ICD-10-CM | POA: Diagnosis not present

## 2016-12-31 DIAGNOSIS — Z87891 Personal history of nicotine dependence: Secondary | ICD-10-CM | POA: Diagnosis not present

## 2016-12-31 DIAGNOSIS — D721 Eosinophilia: Secondary | ICD-10-CM | POA: Diagnosis not present

## 2016-12-31 DIAGNOSIS — J4550 Severe persistent asthma, uncomplicated: Secondary | ICD-10-CM | POA: Diagnosis not present

## 2017-01-04 ENCOUNTER — Ambulatory Visit: Payer: PPO | Admitting: Internal Medicine

## 2017-01-05 ENCOUNTER — Other Ambulatory Visit (HOSPITAL_COMMUNITY): Payer: PPO

## 2017-01-07 ENCOUNTER — Other Ambulatory Visit: Payer: Self-pay

## 2017-01-07 NOTE — Patient Outreach (Signed)
[facility]) Care Management  01/07/2017  Glenda Terry Aug 15, 1940 [id number]   Telephone Screen  Referral Date: 01/07/17 Referral Source: Nurse Line F/U Call Referral Reason: abd pain, EGD scheduled for 01/20/17 Insurance: H[employer]   Outreach attempt # 1 to patient. No answer at present and RN CM unable to leave message.       Plan: RN CM will make outreach attempt to patient within three business days.   Enzo Montgomery, RN,BSN,CCM [place] [facility] Telephonic Care Management Coordinator Direct Phone: [phone] Toll Free: [phone] Fax: [phone]

## 2017-01-11 ENCOUNTER — Other Ambulatory Visit: Payer: Self-pay

## 2017-01-11 DIAGNOSIS — M961 Postlaminectomy syndrome, not elsewhere classified: Secondary | ICD-10-CM | POA: Diagnosis not present

## 2017-01-11 NOTE — Patient Instructions (Signed)
Glenda Terry  01/11/2017     @PREFPERIOPPHARMACY @   Your procedure is scheduled on  01/20/2017   Report to [facility] at  615   A.M.  Call this number if you have problems the morning of surgery:  [phone]   Remember:  Do not eat food or drink liquids after midnight.  Take these medicines the morning of surgery with A SIP OF WATER  Xanax, singulair, benicar, oxycodone, protonix. Use your inhalers and your Nebulizer before you come.   Do not wear jewelry, make-up or nail polish.  Do not wear lotions, powders, or perfumes, or deoderant.  Do not shave 48 hours prior to surgery.  Men may shave face and neck.  Do not bring valuables to the hospital.  [facility] is not responsible for any belongings or valuables.  Contacts, dentures or bridgework may not be worn into surgery.  Leave your suitcase in the car.  After surgery it may be brought to your room.  For patients admitted to the hospital, discharge time will be determined by your treatment team.  Patients discharged the day of surgery will not be allowed to drive home.   Name and phone number of your driver:   family Special instructions:  Follow the diet instructions given to you by Dr Roseanne Kaufman office.  Please read over the following fact sheets that you were given. Anesthesia Post-op Instructions and Care and Recovery After Surgery       Esophagogastroduodenoscopy Esophagogastroduodenoscopy (EGD) is a procedure to examine the lining of the esophagus, stomach, and first part of the small intestine (duodenum). This procedure is done to check for problems such as inflammation, bleeding, ulcers, or growths. During this procedure, a long, flexible, lighted tube with a camera attached (endoscope) is inserted down the throat. Tell a health care provider about:  Any allergies you have.  All medicines you are taking, including vitamins, herbs, eye drops, creams, and over-the-counter  medicines.  Any problems you or family members have had with anesthetic medicines.  Any blood disorders you have.  Any surgeries you have had.  Any medical conditions you have.  Whether you are pregnant or may be pregnant. What are the risks? Generally, this is a safe procedure. However, problems may occur, including:  Infection.  Bleeding.  A tear (perforation) in the esophagus, stomach, or duodenum.  Trouble breathing.  Excessive sweating.  Spasms of the larynx.  A slowed heartbeat.  Low blood pressure.  What happens before the procedure?  Follow instructions from your health care provider about eating or drinking restrictions.  Ask your health care provider about: ? Changing or stopping your regular medicines. This is especially important if you are taking diabetes medicines or blood thinners. ? Taking medicines such as aspirin and ibuprofen. These medicines can thin your blood. Do not take these medicines before your procedure if your health care provider instructs you not to.  Plan to have someone take you home after the procedure.  If you wear dentures, be ready to remove them before the procedure. What happens during the procedure?  To reduce your risk of infection, your health care team will wash or sanitize their hands.  An IV tube will be put in a vein in your hand or arm. You will get medicines and fluids through this tube.  You will be given one or more of the following: ? A medicine to help you relax (sedative). ?  A medicine to numb the area (local anesthetic). This medicine may be sprayed into your throat. It will make you feel more comfortable and keep you from gagging or coughing during the procedure. ? A medicine for pain.  A mouth guard may be placed in your mouth to protect your teeth and to keep you from biting on the endoscope.  You will be asked to lie on your left side.  The endoscope will be lowered down your throat into your esophagus,  stomach, and duodenum.  Air will be put into the endoscope. This will help your health care provider see better.  The lining of your esophagus, stomach, and duodenum will be examined.  Your health care provider may: ? Take a tissue sample so it can be looked at in a lab (biopsy). ? Remove growths. ? Remove objects (foreign bodies) that are stuck. ? Treat any bleeding with medicines or other devices that stop tissue from bleeding. ? Widen (dilate) or stretch narrowed areas of your esophagus and stomach.  The endoscope will be taken out. The procedure may vary among health care providers and hospitals. What happens after the procedure?  Your blood pressure, heart rate, breathing rate, and blood oxygen level will be monitored often until the medicines you were given have worn off.  Do not eat or drink anything until the numbing medicine has worn off and your gag reflex has returned. This information is not intended to replace advice given to you by your health care provider. Make sure you discuss any questions you have with your health care provider. Document Released: 07/02/2004 Document Revised: 08/07/2015 Document Reviewed: 01/23/2015 Elsevier Interactive Patient Education  2018 [employer]. Esophagogastroduodenoscopy, Care After Refer to this sheet in the next few weeks. These instructions provide you with information about caring for yourself after your procedure. Your health care provider may also give you more specific instructions. Your treatment has been planned according to current medical practices, but problems sometimes occur. Call your health care provider if you have any problems or questions after your procedure. What can I expect after the procedure? After the procedure, it is common to have:  A sore throat.  Nausea.  Bloating.  Dizziness.  Fatigue.  Follow these instructions at home:  Do not eat or drink anything until the numbing medicine (local anesthetic)  has worn off and your gag reflex has returned. You will know that the local anesthetic has worn off when you can swallow comfortably.  Do not drive for 24 hours if you received a medicine to help you relax (sedative).  If your health care provider took a tissue sample for testing during the procedure, make sure to get your test results. This is your responsibility. Ask your health care provider or the department performing the test when your results will be ready.  Keep all follow-up visits as told by your health care provider. This is important. Contact a health care provider if:  You cannot stop coughing.  You are not urinating.  You are urinating less than usual. Get help right away if:  You have trouble swallowing.  You cannot eat or drink.  You have throat or chest pain that gets worse.  You are dizzy or light-headed.  You faint.  You have nausea or vomiting.  You have chills.  You have a fever.  You have severe abdominal pain.  You have black, tarry, or bloody stools. This information is not intended to replace advice given to you by your health  care provider. Make sure you discuss any questions you have with your health care provider. Document Released: 02/16/2012 Document Revised: 08/07/2015 Document Reviewed: 01/23/2015 Elsevier Interactive Patient Education  2018 [employer].  Esophageal Dilatation Esophageal dilatation is a procedure to open a blocked or narrowed part of the esophagus. The esophagus is the long tube in your throat that carries food and liquid from your mouth to your stomach. The procedure is also called esophageal dilation. You may need this procedure if you have a buildup of scar tissue in your esophagus that makes it difficult, painful, or even impossible to swallow. This can be caused by gastroesophageal reflux disease (GERD). In rare cases, people need this procedure because they have cancer of the esophagus or a problem with the way food  moves through the esophagus. Sometimes you may need to have another dilatation to enlarge the opening of the esophagus gradually. Tell a health care provider about:  Any allergies you have.  All medicines you are taking, including vitamins, herbs, eye drops, creams, and over-the-counter medicines.  Any problems you or family members have had with anesthetic medicines.  Any blood disorders you have.  Any surgeries you have had.  Any medical conditions you have.  Any antibiotic medicines you are required to take before dental procedures. What are the risks? Generally, this is a safe procedure. However, problems can occur and include:  Bleeding from a tear in the lining of the esophagus.  A hole (perforation) in the esophagus.  What happens before the procedure?  Do not eat or drink anything after midnight on the night before the procedure or as directed by your health care provider.  Ask your health care provider about changing or stopping your regular medicines. This is especially important if you are taking diabetes medicines or blood thinners.  Plan to have someone take you home after the procedure. What happens during the procedure?  You will be given a medicine that makes you relaxed and sleepy (sedative).  A medicine may be sprayed or gargled to numb the back of the throat.  Your health care provider can use various instruments to do an esophageal dilatation. During the procedure, the instrument used will be placed in your mouth and passed down into your esophagus. Options include: ? Simple dilators. This instrument is carefully placed in the esophagus to stretch it. ? Guided wire bougies. In this method, a flexible tube (endoscope) is used to insert a wire into the esophagus. The dilator is passed over this wire to enlarge the esophagus. Then the wire is removed. ? Balloon dilators. An endoscope with a small balloon at the end is passed down into the esophagus. Inflating  the balloon gently stretches the esophagus and opens it up. What happens after the procedure?  Your blood pressure, heart rate, breathing rate, and blood oxygen level will be monitored often until the medicines you were given have worn off.  Your throat may feel slightly sore and will probably still feel numb. This will improve slowly over time.  You will not be allowed to eat or drink until the throat numbness has resolved.  If this is a same-day procedure, you may be allowed to go home once you have been able to drink, urinate, and sit on the edge of the bed without nausea or dizziness.  If this is a same-day procedure, you should have a friend or family member with you for the next 24 hours after the procedure. This information is not intended to  replace advice given to you by your health care provider. Make sure you discuss any questions you have with your health care provider. Document Released: 04/22/2005 Document Revised: 08/07/2015 Document Reviewed: 07/11/2013 Elsevier Interactive Patient Education  2017 [place] Anesthesia is a term that refers to techniques, procedures, and medicines that help a person stay safe and comfortable during a medical procedure. Monitored anesthesia care, or sedation, is one type of anesthesia. Your anesthesia specialist may recommend sedation if you will be having a procedure that does not require you to be unconscious, such as:  Cataract surgery.  A dental procedure.  A biopsy.  A colonoscopy.  During the procedure, you may receive a medicine to help you relax (sedative). There are three levels of sedation:  Mild sedation. At this level, you may feel awake and relaxed. You will be able to follow directions.  Moderate sedation. At this level, you will be sleepy. You may not remember the procedure.  Deep sedation. At this level, you will be asleep. You will not remember the procedure.  The more medicine you are  given, the deeper your level of sedation will be. Depending on how you respond to the procedure, the anesthesia specialist may change your level of sedation or the type of anesthesia to fit your needs. An anesthesia specialist will monitor you closely during the procedure. Let your health care provider know about:  Any allergies you have.  All medicines you are taking, including vitamins, herbs, eye drops, creams, and over-the-counter medicines.  Any use of steroids (by mouth or as a cream).  Any problems you or family members have had with sedatives and anesthetic medicines.  Any blood disorders you have.  Any surgeries you have had.  Any medical conditions you have, such as sleep apnea.  Whether you are pregnant or may be pregnant.  Any use of cigarettes, alcohol, or street drugs. What are the risks? Generally, this is a safe procedure. However, problems may occur, including:  Getting too much medicine (oversedation).  Nausea.  Allergic reaction to medicines.  Trouble breathing. If this happens, a breathing tube may be used to help with breathing. It will be removed when you are awake and breathing on your own.  Heart trouble.  Lung trouble.  Before the procedure Staying hydrated Follow instructions from your health care provider about hydration, which may include:  Up to 2 hours before the procedure - you may continue to drink clear liquids, such as water, clear fruit juice, black coffee, and plain tea.  Eating and drinking restrictions Follow instructions from your health care provider about eating and drinking, which may include:  8 hours before the procedure - stop eating heavy meals or foods such as meat, fried foods, or fatty foods.  6 hours before the procedure - stop eating light meals or foods, such as toast or cereal.  6 hours before the procedure - stop drinking milk or drinks that contain milk.  2 hours before the procedure - stop drinking clear  liquids.  Medicines Ask your health care provider about:  Changing or stopping your regular medicines. This is especially important if you are taking diabetes medicines or blood thinners.  Taking medicines such as aspirin and ibuprofen. These medicines can thin your blood. Do not take these medicines before your procedure if your health care provider instructs you not to.  Tests and exams  You will have a physical exam.  You may have blood tests done to show: ?  How well your kidneys and liver are working. ? How well your blood can clot.  General instructions  Plan to have someone take you home from the hospital or clinic.  If you will be going home right after the procedure, plan to have someone with you for 24 hours.  What happens during the procedure?  Your blood pressure, heart rate, breathing, level of pain and overall condition will be monitored.  An IV tube will be inserted into one of your veins.  Your anesthesia specialist will give you medicines as needed to keep you comfortable during the procedure. This may mean changing the level of sedation.  The procedure will be performed. After the procedure  Your blood pressure, heart rate, breathing rate, and blood oxygen level will be monitored until the medicines you were given have worn off.  Do not drive for 24 hours if you received a sedative.  You may: ? Feel sleepy, clumsy, or nauseous. ? Feel forgetful about what happened after the procedure. ? Have a sore throat if you had a breathing tube during the procedure. ? Vomit. This information is not intended to replace advice given to you by your health care provider. Make sure you discuss any questions you have with your health care provider. Document Released: 11/25/2004 Document Revised: 08/08/2015 Document Reviewed: 06/22/2015 Elsevier Interactive Patient Education  2018 [place], Care After These instructions provide you with  information about caring for yourself after your procedure. Your health care provider may also give you more specific instructions. Your treatment has been planned according to current medical practices, but problems sometimes occur. Call your health care provider if you have any problems or questions after your procedure. What can I expect after the procedure? After your procedure, it is common to:  Feel sleepy for several hours.  Feel clumsy and have poor balance for several hours.  Feel forgetful about what happened after the procedure.  Have poor judgment for several hours.  Feel nauseous or vomit.  Have a sore throat if you had a breathing tube during the procedure.  Follow these instructions at home: For at least 24 hours after the procedure:   Do not: ? Participate in activities in which you could fall or become injured. ? Drive. ? Use heavy machinery. ? Drink alcohol. ? Take sleeping pills or medicines that cause drowsiness. ? Make important decisions or sign legal documents. ? Take care of children on your own.  Rest. Eating and drinking  Follow the diet that is recommended by your health care provider.  If you vomit, drink water, juice, or soup when you can drink without vomiting.  Make sure you have little or no nausea before eating solid foods. General instructions  Have a responsible adult stay with you until you are awake and alert.  Take over-the-counter and prescription medicines only as told by your health care provider.  If you smoke, do not smoke without supervision.  Keep all follow-up visits as told by your health care provider. This is important. Contact a health care provider if:  You keep feeling nauseous or you keep vomiting.  You feel light-headed.  You develop a rash.  You have a fever. Get help right away if:  You have trouble breathing. This information is not intended to replace advice given to you by your health care provider.  Make sure you discuss any questions you have with your health care provider. Document Released: 06/22/2015 Document Revised: 10/22/2015 Document Reviewed: 06/22/2015  Chartered certified [employer] Patient Education  [employer].

## 2017-01-11 NOTE — Patient Outreach (Signed)
[facility]) Care Management  01/11/2017  Glenda Terry 1940-10-11 [id number]   Telephone Screen  Referral Date: 01/07/17 Referral Source: Nurse Line F/U Call Referral Reason: abd pain, EGD scheduled for 01/20/17 Insurance: HTA   Outreach attempt #2 to patient. No answer at present. RN CM left HIPAA compliant voicemail message along with contact info. RN CM attempted alternate number listed for patient and no answer at present.     Plan:  RN CM will make outreach attempt to patient within one business day if no return call from patient.     Enzo Montgomery, RN,BSN,CCM [facility] Management Telephonic Care Management Coordinator Direct Phone: [phone] Toll Free: [phone] Fax: [phone]

## 2017-01-12 ENCOUNTER — Other Ambulatory Visit: Payer: Self-pay

## 2017-01-12 ENCOUNTER — Ambulatory Visit: Payer: PPO

## 2017-01-12 DIAGNOSIS — R600 Localized edema: Secondary | ICD-10-CM | POA: Diagnosis not present

## 2017-01-12 DIAGNOSIS — R000 Tachycardia, unspecified: Secondary | ICD-10-CM | POA: Diagnosis not present

## 2017-01-12 DIAGNOSIS — J82 Pulmonary eosinophilia, not elsewhere classified: Secondary | ICD-10-CM | POA: Diagnosis not present

## 2017-01-12 NOTE — Patient Outreach (Signed)
[facility] Care Management  01/12/2017  VLASTA BASKIN Apr 08, 1940 [id number]   Telephone Screen  Referral Date: 01/07/17 Referral Source: Nurse Line F/U Call Referral Reason: abd pain, EGD scheduled for 01/20/17 Insurance: HTA     Outreach attempt #3 to patient. No answer at present.     Plan: RN CM will send unsuccessful outreach letter to patient and close case if no response within 10 business days.   Enzo Montgomery, RN,BSN,CCM [place] Management Telephonic Care Management Coordinator Direct Phone: [phone] Toll Free: [phone] Fax: [phone]

## 2017-01-13 ENCOUNTER — Encounter (HOSPITAL_COMMUNITY)
Admission: RE | Admit: 2017-01-13 | Discharge: 2017-01-13 | Disposition: A | Discharge status: Home / Self Care | Payer: PPO | Source: Ambulatory Visit | Attending: Internal Medicine | Admitting: Internal Medicine

## 2017-01-13 ENCOUNTER — Encounter (HOSPITAL_COMMUNITY): Payer: Self-pay

## 2017-01-13 DIAGNOSIS — K219 Gastro-esophageal reflux disease without esophagitis: Secondary | ICD-10-CM | POA: Insufficient documentation

## 2017-01-13 DIAGNOSIS — Z01818 Encounter for other preprocedural examination: Secondary | ICD-10-CM | POA: Diagnosis not present

## 2017-01-13 DIAGNOSIS — R1310 Dysphagia, unspecified: Secondary | ICD-10-CM | POA: Diagnosis not present

## 2017-01-13 HISTORY — DX: Unspecified hearing loss, unspecified ear: H91.90

## 2017-01-13 LAB — CBC WITH DIFFERENTIAL/PLATELET
BASOS PCT: 0 %
Basophils Absolute: 0 10*3/uL (ref 0.0–0.1)
EOS ABS: 0 10*3/uL (ref 0.0–0.7)
EOS PCT: 0 %
HCT: 34.1 % — ABNORMAL LOW (ref 36.0–46.0)
Hemoglobin: 10.9 g/dL — ABNORMAL LOW (ref 12.0–15.0)
LYMPHS ABS: 3 10*3/uL (ref 0.7–4.0)
Lymphocytes Relative: 24 %
MCH: 25.3 pg — AB (ref 26.0–34.0)
MCHC: 32 g/dL (ref 30.0–36.0)
MCV: 79.1 fL (ref 78.0–100.0)
MONO ABS: 0.9 10*3/uL (ref 0.1–1.0)
MONOS PCT: 8 %
NEUTROS ABS: 8.4 10*3/uL — AB (ref 1.7–7.7)
NEUTROS PCT: 68 %
PLATELETS: 242 10*3/uL (ref 150–400)
RBC: 4.31 MIL/uL (ref 3.87–5.11)
RDW: 14.8 % (ref 11.5–15.5)
WBC: 12.3 10*3/uL — ABNORMAL HIGH (ref 4.0–10.5)

## 2017-01-13 LAB — BASIC METABOLIC PANEL
Anion gap: 12 (ref 5–15)
BUN: 37 mg/dL — AB (ref 6–20)
CALCIUM: 9.4 mg/dL (ref 8.9–10.3)
CO2: 23 mmol/L (ref 22–32)
CREATININE: 2.48 mg/dL — AB (ref 0.44–1.00)
Chloride: 96 mmol/L — ABNORMAL LOW (ref 101–111)
GFR calc non Af Amer: 18 mL/min — ABNORMAL LOW (ref >60)
GFR, EST AFRICAN AMERICAN: 21 mL/min — AB (ref >60)
Glucose, Bld: 93 mg/dL (ref 65–99)
Potassium: 3.8 mmol/L (ref 3.5–5.1)
SODIUM: 131 mmol/L — AB (ref 135–145)

## 2017-01-18 NOTE — Progress Notes (Signed)
cc'ed to pcp °

## 2017-01-20 ENCOUNTER — Ambulatory Visit (HOSPITAL_COMMUNITY): Payer: PPO | Admitting: Anesthesiology

## 2017-01-20 ENCOUNTER — Ambulatory Visit (HOSPITAL_COMMUNITY)
Admission: RE | Admit: 2017-01-20 | Discharge: 2017-01-20 | Disposition: A | Discharge status: Home / Self Care | Payer: PPO | Source: Ambulatory Visit | Attending: Internal Medicine | Admitting: Internal Medicine

## 2017-01-20 ENCOUNTER — Encounter (HOSPITAL_COMMUNITY): Payer: Self-pay | Admitting: *Deleted

## 2017-01-20 ENCOUNTER — Encounter (HOSPITAL_COMMUNITY)
Admission: RE | Disposition: A | Discharge status: Home / Self Care | Payer: Self-pay | Source: Ambulatory Visit | Attending: Internal Medicine

## 2017-01-20 DIAGNOSIS — Z79899 Other long term (current) drug therapy: Secondary | ICD-10-CM | POA: Diagnosis not present

## 2017-01-20 DIAGNOSIS — K219 Gastro-esophageal reflux disease without esophagitis: Secondary | ICD-10-CM | POA: Insufficient documentation

## 2017-01-20 DIAGNOSIS — R1310 Dysphagia, unspecified: Secondary | ICD-10-CM

## 2017-01-20 DIAGNOSIS — Z881 Allergy status to other antibiotic agents status: Secondary | ICD-10-CM | POA: Diagnosis not present

## 2017-01-20 DIAGNOSIS — Z800 Family history of malignant neoplasm of digestive organs: Secondary | ICD-10-CM | POA: Insufficient documentation

## 2017-01-20 DIAGNOSIS — Z87891 Personal history of nicotine dependence: Secondary | ICD-10-CM | POA: Insufficient documentation

## 2017-01-20 DIAGNOSIS — J449 Chronic obstructive pulmonary disease, unspecified: Secondary | ICD-10-CM | POA: Diagnosis not present

## 2017-01-20 DIAGNOSIS — Z888 Allergy status to other drugs, medicaments and biological substances status: Secondary | ICD-10-CM | POA: Insufficient documentation

## 2017-01-20 DIAGNOSIS — R1314 Dysphagia, pharyngoesophageal phase: Secondary | ICD-10-CM | POA: Diagnosis not present

## 2017-01-20 DIAGNOSIS — Z886 Allergy status to analgesic agent status: Secondary | ICD-10-CM | POA: Insufficient documentation

## 2017-01-20 DIAGNOSIS — K449 Diaphragmatic hernia without obstruction or gangrene: Secondary | ICD-10-CM | POA: Insufficient documentation

## 2017-01-20 DIAGNOSIS — I10 Essential (primary) hypertension: Secondary | ICD-10-CM | POA: Insufficient documentation

## 2017-01-20 DIAGNOSIS — F419 Anxiety disorder, unspecified: Secondary | ICD-10-CM | POA: Diagnosis not present

## 2017-01-20 DIAGNOSIS — Z90710 Acquired absence of both cervix and uterus: Secondary | ICD-10-CM | POA: Diagnosis not present

## 2017-01-20 DIAGNOSIS — M064 Inflammatory polyarthropathy: Secondary | ICD-10-CM | POA: Insufficient documentation

## 2017-01-20 HISTORY — PX: ESOPHAGOGASTRODUODENOSCOPY (EGD) WITH PROPOFOL: SHX5813

## 2017-01-20 HISTORY — PX: MALONEY DILATION: SHX5535

## 2017-01-20 SURGERY — ESOPHAGOGASTRODUODENOSCOPY (EGD) WITH PROPOFOL
Anesthesia: Monitor Anesthesia Care

## 2017-01-20 MED ORDER — MIDAZOLAM HCL 2 MG/2ML IJ SOLN
1.0000 mg | INTRAMUSCULAR | Status: AC
  Administered 2017-01-20: 2 mg via INTRAVENOUS

## 2017-01-20 MED ORDER — MIDAZOLAM HCL 2 MG/2ML IJ SOLN
INTRAMUSCULAR | Status: AC
  Filled 2017-01-20: qty 2

## 2017-01-20 MED ORDER — PROPOFOL 10 MG/ML IV BOLUS
INTRAVENOUS | Status: AC
  Filled 2017-01-20: qty 60

## 2017-01-20 MED ORDER — LACTATED RINGERS IV SOLN
INTRAVENOUS | Status: DC
  Administered 2017-01-20: 07:00:00 via INTRAVENOUS

## 2017-01-20 MED ORDER — CHLORHEXIDINE GLUCONATE CLOTH 2 % EX PADS: 6.0000 | MEDICATED_PAD | Freq: Once | CUTANEOUS | Status: DC

## 2017-01-20 MED ORDER — FENTANYL CITRATE (PF) 100 MCG/2ML IJ SOLN
INTRAMUSCULAR | Status: AC
  Filled 2017-01-20: qty 2

## 2017-01-20 MED ORDER — LIDOCAINE HCL (CARDIAC) 10 MG/ML IV SOLN
INTRAVENOUS | Status: DC | PRN
  Administered 2017-01-20: 30 mg via INTRAVENOUS

## 2017-01-20 MED ORDER — LIDOCAINE HCL (PF) 1 % IJ SOLN
INTRAMUSCULAR | Status: AC
  Filled 2017-01-20: qty 5

## 2017-01-20 MED ORDER — LIDOCAINE VISCOUS 2 % MT SOLN
OROMUCOSAL | Status: AC
  Filled 2017-01-20: qty 15

## 2017-01-20 MED ORDER — PROPOFOL 500 MG/50ML IV EMUL
INTRAVENOUS | Status: DC | PRN
  Administered 2017-01-20: 150 ug/kg/min via INTRAVENOUS

## 2017-01-20 MED ORDER — FENTANYL CITRATE (PF) 100 MCG/2ML IJ SOLN
25.0000 ug | Freq: Once | INTRAMUSCULAR | Status: AC
  Administered 2017-01-20: 25 ug via INTRAVENOUS

## 2017-01-20 MED ORDER — LIDOCAINE VISCOUS 2 % MT SOLN
15.0000 mL | Freq: Once | OROMUCOSAL | Status: AC
  Administered 2017-01-20: 15 mL via OROMUCOSAL

## 2017-01-20 NOTE — Discharge Instructions (Signed)
Gastroesophageal Reflux Scan A gastroesophageal reflux scan is a procedure that is used to check for gastroesophageal reflux, which is the backward flow of stomach contents into the tube that carries food from the mouth to the stomach (esophagus). The scan can also show if any stomach contents are inhaled (aspirated) into your lungs. You may need this scan if you have symptoms such as heartburn, vomiting, swallowing problems, or regurgitation. Regurgitation means that swallowed food is returning from the stomach to the esophagus. For this scan, you will drink a liquid that contains a small amount of a radioactive substance (tracer). A scanner with a camera that detects the radioactive tracer is used to see if any of the material backs up into your esophagus. Tell a health care provider about:  Any allergies you have.  All medicines you are taking, including vitamins, herbs, eye drops, creams, and over-the-counter medicines.  Any blood disorders you have.  Any surgeries you have had.  Any medical conditions you have.  If you are pregnant or you think that you may be pregnant.  If you are breastfeeding. What are the risks? Generally, this is a safe procedure. However, problems may occur, including:  Exposure to radiation (a small amount).  Allergic reaction to the radioactive substance. This is rare.  What happens before the procedure?  Ask your health care provider about changing or stopping your regular medicines. This is especially important if you are taking diabetes medicines or blood thinners.  Follow your health care providers instructions about eating or drinking restrictions. What happens during the procedure?  You will be asked to drink a liquid that contains a small amount of a radioactive tracer. This liquid will probably be similar to orange juice.  You will assume a position lying on your back.  A series of images will be taken of your esophagus and upper  stomach.  You may be asked to move into different positions to help determine if reflux occurs more often when you are in specific positions.  For adults, an abdominal binder with an inflatable cuff may be placed on the belly (abdomen). This may be used to increase abdominal pressure. More images will be taken to see if the increased pressure causes reflux to occur. The procedure may vary among health care providers and hospitals. What happens after the procedure?  Return to your normal activities and your normal diet as directed by your health care provider.  The radioactive tracer will leave your body over the next few days. Drink enough fluid to keep your urine clear or pale yellow. This will help to flush the tracer out of your body.  It is your responsibility to obtain your test results. Ask your health care provider or the department performing the test when and how you will get your results. This information is not intended to replace advice given to you by your health care provider. Make sure you discuss any questions you have with your health care provider. Document Released: 04/22/2005 Document Revised: 11/24/2015 Document Reviewed: 12/11/2013 Elsevier Interactive Patient Education  2018 [employer]. EGD Discharge instructions Please read the instructions outlined below and refer to this sheet in the next few weeks. These discharge instructions provide you with general information on caring for yourself after you leave the hospital. Your doctor may also give you specific instructions. While your treatment has been planned according to the most current medical practices available, unavoidable complications occasionally occur. If you have any problems or questions after discharge, please call  your doctor. ACTIVITY  You may resume your regular activity but move at a slower pace for the next 24 hours.   Take frequent rest periods for the next 24 hours.   Walking will help expel (get  rid of) the air and reduce the bloated feeling in your abdomen.   No driving for 24 hours (because of the anesthesia (medicine) used during the test).   You may shower.   Do not sign any important legal documents or operate any machinery for 24 hours (because of the anesthesia used during the test).  NUTRITION  Drink plenty of fluids.   You may resume your normal diet.   Begin with a light meal and progress to your normal diet.   Avoid alcoholic beverages for 24 hours or as instructed by your caregiver.  MEDICATIONS  You may resume your normal medications unless your caregiver tells you otherwise.  WHAT YOU CAN EXPECT TODAY  You may experience abdominal discomfort such as a feeling of fullness or gas pains.  FOLLOW-UP  Your doctor will discuss the results of your test with you.  SEEK IMMEDIATE MEDICAL ATTENTION IF ANY OF THE FOLLOWING OCCUR:  Excessive nausea (feeling sick to your stomach) and/or vomiting.   Severe abdominal pain and distention (swelling).   Trouble swallowing.   Temperature over 101 F (37.8 C).   Rectal bleeding or vomiting of blood.    GERD information provided; go to [employer] and get a foam wedge to elevate the head of her bed  Stop Protonix; begin Nexium 40 mg twice daily  Office visit with [place] in 6 weeks  Take MiraLAX and Colace every day to promote bowel function.

## 2017-01-20 NOTE — Anesthesia Postprocedure Evaluation (Signed)
Anesthesia Post Note  Patient: RAMON BRANT  Procedure(s) Performed: ESOPHAGOGASTRODUODENOSCOPY (EGD) WITH PROPOFOL (N/A ) MALONEY DILATION (N/A )  Patient location during evaluation: PACU Anesthesia Type: MAC Level of consciousness: awake and alert, oriented and patient cooperative Pain management: pain level controlled Vital Signs Assessment: post-procedure vital signs reviewed and stable Respiratory status: spontaneous breathing Cardiovascular status: stable Postop Assessment: no apparent nausea or vomiting Anesthetic complications: no     Last Vitals:  Vitals:   01/20/17 0644 01/20/17 0700  BP: 135/65 139/76  Resp: 17 (!) 25  Temp: 36.7 C   SpO2: 97% 94%    Last Pain:  Vitals:   01/20/17 0737  TempSrc:   PainSc: 5                  Katharina Jehle A

## 2017-01-20 NOTE — Transfer of Care (Signed)
Immediate Anesthesia Transfer of Care Note  Patient: Glenda Terry  Procedure(s) Performed: ESOPHAGOGASTRODUODENOSCOPY (EGD) WITH PROPOFOL (N/A ) MALONEY DILATION (N/A )  Patient Location: PACU  Anesthesia Type:MAC  Level of Consciousness: awake, alert , oriented and patient cooperative  Airway & Oxygen Therapy: Patient Spontanous Breathing  Post-op Assessment: Report given to RN and Post -op Vital signs reviewed and stable  Post vital signs: Reviewed and stable  Last Vitals:  Vitals:   01/20/17 0644 01/20/17 0700  BP: 135/65 139/76  Resp: 17 (!) 25  Temp: 36.7 C   SpO2: 97% 94%    Last Pain:  Vitals:   01/20/17 0737  TempSrc:   PainSc: 5       Patients Stated Pain Goal: 7 (80/88/11 0315)  Complications: No apparent anesthesia complications

## 2017-01-20 NOTE — H&P (Signed)
@LOGO @   Primary Care Physician:  Celene Squibb, MD Primary Gastroenterologist:  Dr. Gala Romney  Pre-Procedure History & Physical: HPI:  Glenda Terry is a 76 y.o. female here for  refractory GERD and esophageal dysphagia or takes Protonix twice daily says it does not help or indigestion.  Past Medical History:  Diagnosis Date  . Anemia NOV 2013  . Anxiety   . Arthritis    inflammatory arthritis  . Asthma 1991   not COPD per [facility]  . Back pain, chronic   . Complication of anesthesia 1985   VITALS SIGNS DROPPED AND COULD NOT TAKE PAIN MEDS UNTIL VITALS IMPROVED  . GERD (gastroesophageal reflux disease)   . HOH (hard of hearing)   . Hypertension   . Tubular adenoma 11/2012    Past Surgical History:  Procedure Laterality Date  . ABDOMINAL HYSTERECTOMY    . BACK SURGERY    . CARPAL TUNNEL RELEASE Right 4/14  . COLONOSCOPY  06/19/2008   RMR: tortuous and elongated colon with scattered left-sided diverticula/colonic mucosa appeared entirely normal. Prior colonic ulcers had resolved.  . COLONOSCOPY  12/2007   Dr. Gala Romney: Scattered diffuse sigmoid diverticula, 2 areas of ulceration at the hepatic flexure. Biopsies unremarkable.  . ESOPHAGOGASTRODUODENOSCOPY  12/2007   Dr. 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 Small hiatal hernia. Slightly pale duodenal mucosa (biopsy unremarkable)  . KNEE ARTHROSCOPY Right   . TUBAL LIGATION      Prior to Admission medications   Medication Sig Start Date End Date Taking? Authorizing Provider  albuterol (PROAIR HFA) 108 (90 BASE) MCG/ACT inhaler Inhale 2 puffs into the lungs every 6 (six) hours as needed for wheezing or shortness of breath. For COPD   Yes [provider]  ALPRAZolam (XANAX) 0.5 MG tablet Take 0.25-0.5 mg by mouth 2 (two) times daily as needed for anxiety.    Yes [provider]  Benralizumab (FASENRA) 30 MG/ML SOSY Inject into the skin every 8  (eight) weeks.   Yes [provider]  budesonide-formoterol (SYMBICORT) 160-4.5 MCG/ACT inhaler Inhale 2 puffs into the lungs 2 (two) times daily.   Yes [provider]  cetirizine (ZYRTEC) 10 MG tablet Take 10 mg by mouth at bedtime.    Yes [provider]  ferrous sulfate 325 (65 FE) MG tablet Take 325 mg by mouth daily with breakfast.   Yes [provider]  fluticasone (FLONASE) 50 MCG/ACT nasal spray Place 1 spray into both nostrils 2 (two) times daily.   Yes [provider]  folic acid (FOLVITE) 1 MG tablet Take 1 mg by mouth daily.   Yes [provider]  ipratropium-albuterol (DUONEB) 0.5-2.5 (3) MG/3ML SOLN Take 3 mLs by nebulization 2 (two) times daily.    Yes [provider]  ketotifen (ZADITOR) 0.025 % ophthalmic solution Place 1 drop into both eyes 2 (two) times daily.   Yes [provider]  montelukast (SINGULAIR) 10 MG tablet Take 10 mg by mouth daily.  04/12/15  Yes [provider]  olmesartan (BENICAR) 40 MG tablet Take 40 mg by mouth daily.   Yes [provider]  oxyCODONE-acetaminophen (PERCOCET/ROXICET) 5-325 MG tablet Take 0.5 tablets by mouth daily.   Yes [provider]  pantoprazole (PROTONIX) 40 MG tablet Take 40 mg by mouth 2 (two) times daily.    Yes [provider]  Polyethyl Glycol-Propyl Glycol (SYSTANE OP) Place 1 drop into both eyes 2 (two) times daily.   Yes  [provider]  potassium chloride (K-DUR) 10 MEQ tablet Take 10 mEq by mouth See admin instructions. Take 10 meq by mouth once daily for 7 days then off for 7 days, then restart 11/16/16  Yes [provider]  predniSONE (DELTASONE) 5 MG tablet Take 5 mg by mouth daily with breakfast.  01/16/16  Yes [provider]  torsemide (DEMADEX) 10 MG tablet Take 10 mg by mouth See admin instructions. Take 10 mg by mouth daily for 7 days then off for 7 days, then restart 11/12/16  Yes [provider]  albuterol (PROVENTIL) (2.5 MG/3ML) 0.083% nebulizer solution Take 3 mLs (2.5 mg total) by nebulization every 4 (four) hours as needed for wheezing or shortness of breath. Patient not taking: Reported on 01/20/2016 09/25/15   Kathie Dike, MD  docusate sodium (COLACE) 100 MG capsule Take 1 capsule (100 mg total) by mouth every 12 (twelve) hours. Patient not taking: Reported on 12/14/2016 12/09/16   Orpah Greek, MD  EPINEPHrine (EPIPEN 2-PAK) 0.3 mg/0.3 mL DEVI Inject 0.3 mg into the muscle once.     [provider]  guaiFENesin (MUCINEX) 600 MG 12 hr tablet Take 1 tablet (600 mg total) by mouth 2 (two) times daily. Patient not taking: Reported on 12/23/2015 09/25/15   Kathie Dike, MD  hydrocortisone (PROCTOZONE-HC) 2.5 % rectal cream Place 1 application rectally 2 (two) times daily. Patient not taking: Reported on 01/20/2016 08/21/15   Mahala Menghini, PA-C  polyethylene glycol [facility] / GLYCOLAX) packet Take 17 g by mouth 2 (two) times daily. Until bowel movement Patient not taking: Reported on 01/05/2017 12/09/16   Orpah Greek, MD    Allergies as of 12/14/2016 - Review Complete 12/14/2016  Allergen Reaction Noted  . Flexeril [cyclobenzaprine] Anaphylaxis 01/12/2012  . Other Anaphylaxis 02/08/2011  . Keflex [cephalexin]  05/26/2012  . Aspirin  05/05/2015  . Statins Other (See Comments) 05/17/2011  . Adhesive [tape] Rash 11/01/2014  . Chlorhexidine Rash 11/19/2014    Family History  Problem Relation Age of Onset  . Breast cancer Sister   . Colon cancer Sister        two deceased, one living and terminal, one current undergoing treatment, ages 31, 85, 7, 65    Social History   Socioeconomic History  . Marital status: Single    Spouse name: Not on file  . Number of children: 3  . Years of education: Not on file  . Highest education level: Not on file  Social Needs  . Financial resource strain: Not on file  . Food insecurity -  worry: Not on file  . Food insecurity - inability: Not on file  . Transportation needs - medical: Not on file  . Transportation needs - non-medical: Not on file  Occupational History  . Not on file  Tobacco Use  . Smoking status: Former Smoker    Packs/day: 2.00    Years: 40.00    Pack years: 80.00    Types: Cigarettes    Last attempt to quit: 01/19/1992    Years since quitting: 25.0  . Smokeless tobacco: Never Used  Substance and Sexual Activity  . Alcohol use: No  . Drug use: No  . Sexual activity: No  Other Topics Concern  . Not on file  Social History Narrative  . Not on file    Review of Systems: See HPI, otherwise negative ROS  Physical Exam: BP 139/76   Temp 98 F (36.7 C) (Oral)   Resp Marland Kitchen)  25   SpO2 94%  General:   Alert,  pleasant and cooperative in NAD vical adenopathy. Lungs:  Clear throughout to auscultation.   No wheezes, crackles, or rhonchi. No acute distress. Heart:  Regular rate and rhythm; no murmurs, clicks, rubs,  or gallops. Abdomen: Non-distended, normal bowel sounds.  Soft and nontender without appreciable mass or hepatosplenomegaly.  Pulses:  Normal pulses noted. Extremities:  Without clubbing or edema. Impression: 76 year old lady with refractory GERD and esophageal dysphagia. Here for EGD with possible esophageal dilation per plan. The risks, benefits, limitations, alternatives and imponderables have been reviewed with the patient. Potential for esophageal dilation, biopsy, etc. have also been reviewed.  Questions have been answered. All parties agreeable.    Notice: This dictation was prepared with Dragon dictation along with smaller phrase technology. Any transcriptional errors that result from this process are unintentional and may not be corrected upon review.

## 2017-01-20 NOTE — Anesthesia Procedure Notes (Signed)
Procedure Name: MAC Date/Time: 01/20/2017 7:33 AM Performed by: Andree Elk Amy A, CRNA Pre-anesthesia Checklist: Patient identified, Emergency Drugs available, Suction available, Patient being monitored and Timeout performed Oxygen Delivery Method: Simple face mask

## 2017-01-20 NOTE — Op Note (Signed)
[facility] Patient Name: Glenda Terry Procedure Date: 01/20/2017 7:07 AM MRN: [id number] Date of Birth: 1940-03-17 Attending MD: Norvel Richards , MD CSN: [id number] Age: 76 Admit Type: Outpatient Procedure:                Upper GI endoscopy Indications:              Dysphagia Providers:                Norvel Richards, MD, Jeanann Lewandowsky. Sharon Seller, RN,                            Aram Candela Referring MD:              Medicines:                Propofol per Anesthesia Complications:            No immediate complications. Estimated Blood Loss:     Estimated blood loss was minimal. Procedure:                Pre-Anesthesia Assessment:                           - Prior to the procedure, a History and Physical                            was performed, and patient medications and                            allergies were reviewed. The patient's tolerance of                            previous anesthesia was also reviewed. The risks                            and benefits of the procedure and the sedation                            options and risks were discussed with the patient.                            All questions were answered, and informed consent                            was obtained. Prior Anticoagulants: The patient has                            taken no previous anticoagulant or antiplatelet                            agents. ASA Grade Assessment: III - A patient with                            severe systemic disease. After reviewing the risks  and benefits, the patient was deemed in                            satisfactory condition to undergo the procedure.                           After obtaining informed consent, the endoscope was                            passed under direct vision. Throughout the                            procedure, the patient's blood pressure, pulse, and                            oxygen saturations were  monitored continuously. The                            [id number]-[id number] ([id number]) scope was introduced through the                            and advanced to the second part of duodenum. The                            upper GI endoscopy was accomplished without                            difficulty. The patient tolerated the procedure                            well. Scope In: 7:39:21 AM Scope Out: 7:44:18 AM Total Procedure Duration: 0 hours 4 minutes 57 seconds  Findings:      The examined esophagus was normal.      A medium-sized hiatal hernia was present.      The entire examined stomach was normal.      The duodenal bulb and second portion of the duodenum were normal. The       scope was withdrawn. Dilation was performed with a Maloney dilator with       moderate resistance at 56 Fr. The dilation site was examined following       endoscope reinsertion and showed no change. Estimated blood loss was       minimal. Impression:               - Normal esophagus. Dilated.                           - Medium-sized hiatal hernia.                           - Normal stomach.                           - Normal duodenal bulb and second portion of the  duodenum.                           - No specimens collected. Moderate Sedation:      Moderate (conscious) sedation was personally administered by an       anesthesia professional. The following parameters were monitored: oxygen       saturation, heart rate, blood pressure, respiratory rate, EKG, adequacy       of pulmonary ventilation, and response to care. Total physician       intraservice time was 9 minutes. Recommendation:           - Patient has a contact number available for                            emergencies. The signs and symptoms of potential                            delayed complications were discussed with the                            patient. Return to normal activities tomorrow.                             Written discharge instructions were provided to the                            patient.                           - Resume previous diet.                           - Continue present medications. stop Protonix;                            begin Nexium 40 mg twice daily. Obtain a foam wedge                            from the pharmacy to elevate the head of your bed                            nightly. Antireflux measures reviewed.                           - No repeat upper endoscopy.                           - Return to GI office in 6 weeks. Procedure Code(s):        --- Professional ---                           [phone], Esophagogastroduodenoscopy, flexible,                            transoral; diagnostic, including collection of  specimen(s) by brushing or washing, when performed                            (separate procedure)                           43450, Dilation of esophagus, by unguided sound or                            bougie, single or multiple passes Diagnosis Code(s):        --- Professional ---                           K44.9, Diaphragmatic hernia without obstruction or                            gangrene                           R13.10, Dysphagia, unspecified CPT copyright 2016 [employer]. All rights reserved. The codes documented in this report are preliminary and upon coder review may  be revised to meet current compliance requirements. Cristopher Estimable. Rei Contee, MD Norvel Richards, MD 01/20/2017 7:58:59 AM This report has been signed electronically. Number of Addenda: 0

## 2017-01-20 NOTE — Anesthesia Preprocedure Evaluation (Signed)
Anesthesia Evaluation  Patient identified by MRN, date of birth, ID band Patient awake    Airway Mallampati: II   Neck ROM: Full    Dental  (+) Dental Advisory Given   Pulmonary shortness of breath and with exertion, COPD,  COPD inhaler, former smoker,    breath sounds clear to auscultation       Cardiovascular hypertension, Pt. on medications  Rhythm:Regular  ECHO 2014 EF 55%   Neuro/Psych PSYCHIATRIC DISORDERS Anxiety    GI/Hepatic GERD  Medicated,  Endo/Other  Morbid obesity  Renal/GU Renal InsufficiencyRenal disease     Musculoskeletal  (+) Arthritis ,   Abdominal (+)  Abdomen: soft.    Peds  Hematology  (+) anemia , 11/36   Anesthesia Other Findings   Reproductive/Obstetrics                             Anesthesia Physical Anesthesia Plan  ASA: III  Anesthesia Plan: MAC   Post-op Pain Management:    Induction: Intravenous  PONV Risk Score and Plan:   Airway Management Planned: Simple Face Mask  Additional Equipment:   Intra-op Plan:   Post-operative Plan:   Informed Consent: I have reviewed the patients History and Physical, chart, labs and discussed the procedure including the risks, benefits and alternatives for the proposed anesthesia with the patient or authorized representative who has indicated his/her understanding and acceptance.     Plan Discussed with:   Anesthesia Plan Comments:         Anesthesia Quick Evaluation

## 2017-01-21 ENCOUNTER — Emergency Department (HOSPITAL_COMMUNITY)
Admission: EM | Admit: 2017-01-21 | Discharge: 2017-01-21 | Disposition: A | Discharge status: Home / Self Care | Payer: PPO | Attending: Emergency Medicine | Admitting: Emergency Medicine

## 2017-01-21 ENCOUNTER — Emergency Department (HOSPITAL_COMMUNITY): Payer: PPO

## 2017-01-21 ENCOUNTER — Encounter (HOSPITAL_COMMUNITY): Payer: Self-pay

## 2017-01-21 ENCOUNTER — Other Ambulatory Visit: Payer: Self-pay

## 2017-01-21 DIAGNOSIS — R1013 Epigastric pain: Secondary | ICD-10-CM | POA: Diagnosis not present

## 2017-01-21 DIAGNOSIS — N183 Chronic kidney disease, stage 3 (moderate): Secondary | ICD-10-CM | POA: Insufficient documentation

## 2017-01-21 DIAGNOSIS — M4854XA Collapsed vertebra, not elsewhere classified, thoracic region, initial encounter for fracture: Secondary | ICD-10-CM | POA: Insufficient documentation

## 2017-01-21 DIAGNOSIS — Z79899 Other long term (current) drug therapy: Secondary | ICD-10-CM | POA: Insufficient documentation

## 2017-01-21 DIAGNOSIS — I129 Hypertensive chronic kidney disease with stage 1 through stage 4 chronic kidney disease, or unspecified chronic kidney disease: Secondary | ICD-10-CM | POA: Diagnosis not present

## 2017-01-21 DIAGNOSIS — Z87891 Personal history of nicotine dependence: Secondary | ICD-10-CM | POA: Insufficient documentation

## 2017-01-21 DIAGNOSIS — K5730 Diverticulosis of large intestine without perforation or abscess without bleeding: Secondary | ICD-10-CM | POA: Diagnosis not present

## 2017-01-21 DIAGNOSIS — S22019A Unspecified fracture of first thoracic vertebra, initial encounter for closed fracture: Secondary | ICD-10-CM | POA: Diagnosis not present

## 2017-01-21 DIAGNOSIS — S22000A Wedge compression fracture of unspecified thoracic vertebra, initial encounter for closed fracture: Secondary | ICD-10-CM

## 2017-01-21 DIAGNOSIS — J45909 Unspecified asthma, uncomplicated: Secondary | ICD-10-CM | POA: Diagnosis not present

## 2017-01-21 DIAGNOSIS — K5900 Constipation, unspecified: Secondary | ICD-10-CM | POA: Insufficient documentation

## 2017-01-21 DIAGNOSIS — R109 Unspecified abdominal pain: Secondary | ICD-10-CM | POA: Diagnosis present

## 2017-01-21 DIAGNOSIS — M549 Dorsalgia, unspecified: Secondary | ICD-10-CM | POA: Diagnosis not present

## 2017-01-21 LAB — CBC WITH DIFFERENTIAL/PLATELET
Basophils Absolute: 0 10*3/uL (ref 0.0–0.1)
Basophils Relative: 0 %
Eosinophils Absolute: 0 10*3/uL (ref 0.0–0.7)
Eosinophils Relative: 0 %
HEMATOCRIT: 36 % (ref 36.0–46.0)
HEMOGLOBIN: 11.5 g/dL — AB (ref 12.0–15.0)
LYMPHS ABS: 2.5 10*3/uL (ref 0.7–4.0)
Lymphocytes Relative: 24 %
MCH: 25.7 pg — AB (ref 26.0–34.0)
MCHC: 31.9 g/dL (ref 30.0–36.0)
MCV: 80.4 fL (ref 78.0–100.0)
MONOS PCT: 7 %
Monocytes Absolute: 0.7 10*3/uL (ref 0.1–1.0)
NEUTROS ABS: 7 10*3/uL (ref 1.7–7.7)
NEUTROS PCT: 69 %
Platelets: 239 10*3/uL (ref 150–400)
RBC: 4.48 MIL/uL (ref 3.87–5.11)
RDW: 15.4 % (ref 11.5–15.5)
WBC: 10.3 10*3/uL (ref 4.0–10.5)

## 2017-01-21 LAB — COMPREHENSIVE METABOLIC PANEL
ALBUMIN: 4.2 g/dL (ref 3.5–5.0)
ALK PHOS: 64 U/L (ref 38–126)
ALT: 36 U/L (ref 14–54)
ANION GAP: 10 (ref 5–15)
AST: 29 U/L (ref 15–41)
BILIRUBIN TOTAL: 0.5 mg/dL (ref 0.3–1.2)
BUN: 16 mg/dL (ref 6–20)
CALCIUM: 9.4 mg/dL (ref 8.9–10.3)
CO2: 24 mmol/L (ref 22–32)
CREATININE: 1.55 mg/dL — AB (ref 0.44–1.00)
Chloride: 102 mmol/L (ref 101–111)
GFR calc non Af Amer: 32 mL/min — ABNORMAL LOW (ref >60)
GFR, EST AFRICAN AMERICAN: 37 mL/min — AB (ref >60)
GLUCOSE: 119 mg/dL — AB (ref 65–99)
Potassium: 4.2 mmol/L (ref 3.5–5.1)
Sodium: 136 mmol/L (ref 135–145)
TOTAL PROTEIN: 7.4 g/dL (ref 6.5–8.1)

## 2017-01-21 LAB — LIPASE, BLOOD: Lipase: 29 U/L (ref 11–51)

## 2017-01-21 MED ORDER — HYDROMORPHONE HCL 1 MG/ML IJ SOLN
0.5000 mg | Freq: Once | INTRAMUSCULAR | Status: AC
  Administered 2017-01-21: 0.5 mg via INTRAVENOUS
  Filled 2017-01-21: qty 1

## 2017-01-21 MED ORDER — SODIUM CHLORIDE 0.9 % IV BOLUS (SEPSIS)
250.0000 mL | Freq: Once | INTRAVENOUS | Status: AC
  Administered 2017-01-21: 250 mL via INTRAVENOUS

## 2017-01-21 MED ORDER — ONDANSETRON HCL 4 MG/2ML IJ SOLN
4.0000 mg | Freq: Once | INTRAMUSCULAR | Status: AC
  Administered 2017-01-21: 4 mg via INTRAVENOUS
  Filled 2017-01-21: qty 2

## 2017-01-21 MED ORDER — SODIUM CHLORIDE 0.9 % IV SOLN
INTRAVENOUS | Status: DC
  Administered 2017-01-21: 19:00:00 via INTRAVENOUS

## 2017-01-21 MED ORDER — IOPAMIDOL (ISOVUE-300) INJECTION 61%
100.0000 mL | Freq: Once | INTRAVENOUS | Status: AC | PRN
  Administered 2017-01-21: 100 mL via INTRAVENOUS

## 2017-01-21 MED ORDER — HYDROCODONE-ACETAMINOPHEN 5-325 MG PO TABS: 1.0000 | ORAL_TABLET | Freq: Four times a day (QID) | ORAL | 0 refills | Status: DC | PRN

## 2017-01-21 MED ORDER — POLYETHYLENE GLYCOL 3350 17 G PO PACK: 17.0000 g | PACK | Freq: Every day | ORAL | 2 refills | Status: DC

## 2017-01-21 NOTE — ED Triage Notes (Addendum)
Patient complains of abdominal pain, back pain and shortness of breath started today. Had EGD yesterday.

## 2017-01-21 NOTE — Discharge Instructions (Signed)
Make an appointment to follow-up with orthopedic surgeon for the compression fracture.  As we discussed she did have a compression fracture in T11 a month ago but now it is worse.  Take the pain medicine as directed.  Take the MiraLAX daily for the constipation.

## 2017-01-21 NOTE — ED Provider Notes (Signed)
[facility] EMERGENCY DEPARTMENT Provider Note   CSN: [id number] Arrival date & time: 01/21/17  1727     History   Chief Complaint Chief Complaint  Patient presents with  . Abdominal Pain  . Back Pain    HPI Glenda Terry is a 76 y.o. female.  Patient status post upper endoscopy yesterday.  This was done for reflux disease.  Done by Dr. Buford Dresser.  Findings did show a hiatal hernia.  At about 1 in the morning patient started with epigastric and right-sided abdominal pain radiating to the right lower quadrant.  And an increase in her back pain.  She has a history of chronic back pain.  But it got worse.  No nausea or vomiting.  No fevers.      Past Medical History:  Diagnosis Date  . Anemia NOV 2013  . Anxiety   . Arthritis    inflammatory arthritis  . Asthma 1991   not COPD per [facility]  . Back pain, chronic   . Complication of anesthesia 1985   VITALS SIGNS DROPPED AND COULD NOT TAKE PAIN MEDS UNTIL VITALS IMPROVED  . GERD (gastroesophageal reflux disease)   . HOH (hard of hearing)   . Hypertension   . Tubular adenoma 11/2012    Patient Active Problem List   Diagnosis Date Noted  . Asthma exacerbation 09/20/2015  . Acute respiratory failure ([place]) 09/20/2015  . CKD (chronic kidney disease) stage 3, GFR 30-59 ml/min (HCC) 09/20/2015  . Hemorrhoids, internal 08/21/2015  . 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 Microcytic anemia 11/01/2012  . FH: colon cancer 11/01/2012  . 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 Herniated lumbar intervertebral disc 04/20/2012  . Difficulty in walking(719.7) 03/13/2012  . Stiffness of joint, not elsewhere classified, lower leg 03/13/2012  . Weakness of right leg 03/13/2012  . OA (osteoarthritis) of knee 01/24/2012  . Anxiety 02/09/2011  . HTN (hypertension) 02/09/2011  . Arthritis 02/09/2011  . GERD (gastroesophageal reflux disease) 02/09/2011    Past Surgical History:    Procedure Laterality Date  . ABDOMINAL HYSTERECTOMY    . BACK SURGERY    . CARPAL TUNNEL RELEASE Right 4/14  . COLONOSCOPY  06/19/2008   RMR: tortuous and elongated colon with scattered left-sided diverticula/colonic mucosa appeared entirely normal. Prior colonic ulcers had resolved.  . COLONOSCOPY  12/2007   Dr. Gala Romney: Scattered diffuse sigmoid diverticula, 2 areas of ulceration at the hepatic flexure. Biopsies unremarkable.  . ESOPHAGOGASTRODUODENOSCOPY  12/2007   Dr. 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 Small hiatal hernia. Slightly pale duodenal mucosa (biopsy unremarkable)  . KNEE ARTHROSCOPY Right   . TUBAL LIGATION      OB History    Gravida Para Term Preterm AB Living   3 3 2 1   3    SAB TAB Ectopic Multiple Live Births                   Home Medications    Prior to Admission medications   Medication Sig Start Date End Date Taking? Authorizing Provider  albuterol (PROAIR HFA) 108 (90 BASE) MCG/ACT inhaler Inhale 2 puffs into the lungs every 6 (six) hours as needed for wheezing or shortness of breath. For COPD    [provider]  albuterol (PROVENTIL) (2.5 MG/3ML) 0.083% nebulizer solution Take 3 mLs (2.5 mg total) by nebulization every 4 (four) hours as needed for wheezing or shortness of breath. Patient not taking: Reported  on 01/20/2016 09/25/15   Kathie Dike, MD  ALPRAZolam Duanne Moron) 0.5 MG tablet Take 0.25-0.5 mg by mouth 2 (two) times daily as needed for anxiety.     [provider]  Benralizumab [facility]) 30 MG/ML SOSY Inject into the skin every 8 (eight) weeks.    [provider]  budesonide-formoterol (SYMBICORT) 160-4.5 MCG/ACT inhaler Inhale 2 puffs into the lungs 2 (two) times daily.    [provider]  cetirizine (ZYRTEC) 10 MG tablet Take 10 mg by mouth at bedtime.     [provider]  docusate sodium (COLACE) 100 MG capsule Take 1 capsule (100 mg total) by mouth  every 12 (twelve) hours. Patient not taking: Reported on 12/14/2016 12/09/16   Orpah Greek, MD  EPINEPHrine (EPIPEN 2-PAK) 0.3 mg/0.3 mL DEVI Inject 0.3 mg into the muscle once.     [provider]  ferrous sulfate 325 (65 FE) MG tablet Take 325 mg by mouth daily with breakfast.    [provider]  fluticasone (FLONASE) 50 MCG/ACT nasal spray Place 1 spray into both nostrils 2 (two) times daily.    [provider]  folic acid (FOLVITE) 1 MG tablet Take 1 mg by mouth daily.    [provider]  guaiFENesin (MUCINEX) 600 MG 12 hr tablet Take 1 tablet (600 mg total) by mouth 2 (two) times daily. Patient not taking: Reported on 12/23/2015 09/25/15   Kathie Dike, MD  HYDROcodone-acetaminophen (NORCO/VICODIN) 5-325 MG tablet Take 1-2 tablets every 6 (six) hours as needed by mouth. 01/21/17   Fredia Sorrow, MD  hydrocortisone (PROCTOZONE-HC) 2.5 % rectal cream Place 1 application rectally 2 (two) times daily. Patient not taking: Reported on 01/20/2016 08/21/15   Mahala Menghini, PA-C  ipratropium-albuterol (DUONEB) 0.5-2.5 (3) MG/3ML SOLN Take 3 mLs by nebulization 2 (two) times daily.     [provider]  ketotifen (ZADITOR) 0.025 % ophthalmic solution Place 1 drop into both eyes 2 (two) times daily.    [provider]  montelukast (SINGULAIR) 10 MG tablet Take 10 mg by mouth daily.  04/12/15   [provider]  olmesartan (BENICAR) 40 MG tablet Take 40 mg by mouth daily.    [provider]  oxyCODONE-acetaminophen (PERCOCET/ROXICET) 5-325 MG tablet Take 0.5 tablets by mouth daily.    [provider]  pantoprazole (PROTONIX) 40 MG tablet Take 40 mg by mouth 2 (two) times daily.     [provider]  Polyethyl Glycol-Propyl Glycol (SYSTANE OP) Place 1 drop into both eyes 2 (two) times daily.    [provider]  polyethylene glycol (MIRALAX / GLYCOLAX) packet Take 17 g by mouth 2 (two) times daily.  Until bowel movement Patient not taking: Reported on 01/05/2017 12/09/16   Orpah Greek, MD  polyethylene glycol [facility]) packet Take 17 g daily by mouth. 01/21/17   Fredia Sorrow, MD  potassium chloride (K-DUR) 10 MEQ tablet Take 10 mEq by mouth See admin instructions. Take 10 meq by mouth once daily for 7 days then off for 7 days, then restart 11/16/16   [provider]  predniSONE (DELTASONE) 5 MG tablet Take 5 mg by mouth daily with breakfast.  01/16/16   [provider]  torsemide (DEMADEX) 10 MG tablet Take 10 mg by mouth See admin instructions. Take 10 mg by mouth daily for 7 days then off for 7 days, then restart 11/12/16   [provider]    Family History Family History  Problem Relation Age of  Onset  . Breast cancer Sister   . Colon cancer Sister        two deceased, one living and terminal, one current undergoing treatment, ages 10, 30, 59, 16    Social History Social History   Tobacco Use  . Smoking status: Former Smoker    Packs/day: 2.00    Years: 40.00    Pack years: 80.00    Types: Cigarettes    Last attempt to quit: 01/19/1992    Years since quitting: 25.0  . Smokeless tobacco: Never Used  Substance Use Topics  . Alcohol use: No  . Drug use: No     Allergies   Flexeril [cyclobenzaprine]; Other; Keflex [cephalexin]; Aspirin; Statins; Adhesive [tape]; and Chlorhexidine   Review of Systems Review of Systems  Constitutional: Negative for fever.  HENT: Negative for congestion.   Eyes: Negative for redness.  Respiratory: Negative for shortness of breath.   Cardiovascular: Negative for chest pain.  Gastrointestinal: Positive for abdominal pain.  Genitourinary: Negative for dysuria.  Musculoskeletal: Positive for back pain.  Skin: Negative for rash.  Neurological: Negative for headaches.  Hematological: Does not bruise/bleed easily.  Psychiatric/Behavioral: Negative for confusion.     Physical Exam Updated Vital  Signs BP 117/76   Pulse 80   Temp 98.5 F (36.9 C) (Oral)   Resp 17   Ht 1.524 m (5')   Wt 93 kg (205 lb)   SpO2 98%   BMI 40.04 kg/m   Physical Exam  Constitutional: She is oriented to person, place, and time. She appears well-developed and well-nourished. She appears toxic. She does not appear ill. No distress.  HENT:  Head: Normocephalic and atraumatic.  Mouth/Throat: Oropharynx is clear and moist.  Eyes: Conjunctivae and EOM are normal. Pupils are equal, round, and reactive to light.  Neck: Normal range of motion. Neck supple.  Cardiovascular: Normal rate and regular rhythm.  Pulmonary/Chest: Effort normal and breath sounds normal. No respiratory distress.  Abdominal: Soft. Bowel sounds are normal. She exhibits no distension. There is no tenderness.  Musculoskeletal: Normal range of motion.  Tenderness to palpation to upper lumbar area.  Neurological: She is alert and oriented to person, place, and time. No cranial nerve deficit or sensory deficit. She exhibits normal muscle tone. Coordination normal.  Skin: Skin is warm. No rash noted.  Nursing note and vitals reviewed.    ED Treatments / Results  Labs (all labs ordered are listed, but only abnormal results are displayed) Labs Reviewed  CBC WITH DIFFERENTIAL/PLATELET - Abnormal; Notable for the following components:      Result Value   Hemoglobin 11.5 (*)    MCH 25.7 (*)    All other components within normal limits  COMPREHENSIVE METABOLIC PANEL - Abnormal; Notable for the following components:   Glucose, Bld 119 (*)    Creatinine, Ser 1.55 (*)    GFR calc non Af Amer 32 (*)    GFR calc Af Amer 37 (*)    All other components within normal limits  LIPASE, BLOOD    EKG  EKG Interpretation  Date/Time:  Friday January 21 2017 17:48:47 EST Ventricular Rate:  128 PR Interval:    QRS Duration: 86 QT Interval:  308 QTC Calculation: 450 R Axis:   -70 Text Interpretation:  Sinus tachycardia Left anterior  fascicular block Low voltage, precordial leads Abnormal R-wave progression, late transition Confirmed by Virgel Manifold [phone]) on 01/21/2017 5:52:47 PM       Radiology Ct Abdomen Pelvis W Contrast  Result Date: 01/21/2017 CLINICAL DATA:  Sharp intermittent epigastric and upper abdominal pain starting today. Patient had upper endoscopy yesterday. EXAM: CT ABDOMEN AND PELVIS WITH CONTRAST TECHNIQUE: Multidetector CT imaging of the abdomen and pelvis was performed using the standard protocol following bolus administration of intravenous contrast. CONTRAST:  80 cc ISOVUE-300 IOPAMIDOL (ISOVUE-300) INJECTION 61% COMPARISON:  None. FINDINGS: Lower chest: Stable small to moderate-sized hiatal hernia. Normal heart size. No acute pulmonary disease. Hepatobiliary: Mild hepatic steatosis. No biliary dilatation. Faint 17 mm hypodensity near the right hepatic dome cannot be further characterized. The gallbladder is unremarkable. No calcified gallstones. Pancreas: No ductal dilatation, mass or inflammation. Spleen: Normal Adrenals/Urinary Tract: Adrenal glands are unremarkable. Kidneys are normal, without renal calculi, focal lesion, or hydronephrosis. Bladder is decompressed without calculus. Stomach/Bowel: The stomach is decompressed in appearance. There is normal small bowel rotation without bowel dilatation or inflammation. Sigmoid and descending colonic diverticulosis without acute diverticulitis. A significant amount of fecal retention is seen throughout the colon without bowel obstruction or inflammation. Normal-appearing appendix is noted. Vascular/Lymphatic: Mild-to-moderate aortoiliac atherosclerosis without aneurysm. No enlarged lymph nodes by CT size criteria. Reproductive: Status post hysterectomy. No adnexal masses. Other: Stable small fat containing umbilical hernia. Musculoskeletal: Progression of inferior endplate compression without retropulsion of the T11 vertebral body, now with approximately 25%  anterior height loss with stable Schmorl's node of the superior endplate of P29. Degenerative disc disease T12-L1, L4-5 as well as L5-S1. IMPRESSION: 1. Progression of inferior endplate compression with 25% height loss of the T11 vertebral body since recent comparison exam from September. This is believed to account for the sharp intermittent upper abdominal and back pain. 2. Colonic diverticulosis without acute diverticulitis. 3. Increased colonic stool burden throughout the colon consistent constipation. 4. Hepatic steatosis. Faint 17 mm nonspecific right hepatic dome hypodensity, focal fatty infiltration, hemangioma are some more likely considerations. 5. Small fat containing stable umbilical hernia. 6. Stable small to moderate-sized hiatal hernia. 7. Aortic atherosclerosis. 8. No free air free nor fluid status post endoscopy. Electronically Signed   By: Ashley Royalty M.D.   On: 01/21/2017 21:08   Dg Chest Port 1 View  Result Date: 01/21/2017 CLINICAL DATA:  Epigastric pain status post EGD today EXAM: PORTABLE CHEST 1 VIEW COMPARISON:  Chest x-ray 12/09/2016 FINDINGS: The cardiac silhouette, mediastinal and hilar contours are within normal limits and stable. The lungs are clear. No pleural effusion or pneumothorax. No free air is seen under the hemidiaphragms to suggest free air. IMPRESSION: No acute cardiopulmonary findings. Electronically Signed   By: Marijo Sanes M.D.   On: 01/21/2017 18:53    Procedures Procedures (including critical care time)  Medications Ordered in ED Medications  0.9 %  sodium chloride infusion ( Intravenous Stopped 01/21/17 2135)  sodium chloride 0.9 % bolus 250 mL (0 mLs Intravenous Stopped 01/21/17 1858)  ondansetron (ZOFRAN) injection 4 mg (4 mg Intravenous Given 01/21/17 1842)  HYDROmorphone (DILAUDID) injection 0.5 mg (0.5 mg Intravenous Given 01/21/17 1842)  iopamidol (ISOVUE-300) 61 % injection 100 mL (100 mLs Intravenous Contrast Given 01/21/17 2030)  HYDROmorphone  (DILAUDID) injection 0.5 mg (0.5 mg Intravenous Given 01/21/17 2143)     Initial Impression / Assessment and Plan / ED Course  I have reviewed the triage vital signs and the nursing notes.  Pertinent labs & imaging results that were available during my care of the patient were reviewed by me and considered in my medical decision making (see chart for details).    Based on patient's complaint of  epigastric and right-sided abdominal pain following an upper endoscopy that was done yesterday.  There was concerns for possible perforation or ischemic abnormalities.  However workup showed no acute intra-abdominal process.  Chest x-ray was negative no free air below the diaphragms.  Labs without a leukocytosis patient's renal function was actually improved.  CT scan was done with contrast.  It did show a worsening of the T11 compression fracture that was present in September.  Most likely this is the cause of her pain.  Also there is an element of constipation which could explain some of the pain.  But her pain was predominantly upper lumbar back area.  Patient is followed by [facility] orthopedics.  Patient treated with pain medicine here to help control the pain.  Patient nontoxic no acute distress.  Patient will also be treated with MiraLAX for the constipation.  Hydrocodone prescription provided to continue to treat the pain.  Patient will call her orthopedic doctor for follow-up on Monday.   Final Clinical Impressions(s) / ED Diagnoses   Final diagnoses:  Compression fx, thoracic spine, closed, initial encounter ([place])  Constipation, unspecified constipation type    ED Discharge Orders        Ordered    polyethylene glycol (MIRALAX) packet  Daily     01/21/17 2147    HYDROcodone-acetaminophen (NORCO/VICODIN) 5-325 MG tablet  Every 6 hours PRN     01/21/17 2147       Fredia Sorrow, MD 01/21/17 2155

## 2017-01-24 ENCOUNTER — Other Ambulatory Visit: Payer: Self-pay | Admitting: *Deleted

## 2017-01-24 NOTE — Patient Outreach (Signed)
[place] [facility]) Care Management  01/24/2017  Glenda Terry 10/08/1940 [id number]   Telephone Screen  Referral Date:  01/24/2017 Referral Source: Nurse Line Follow Up Call Reason for Referral: severe abdominal pain, severe back pain Insurance: [employer]   Outreach Attempt: Outreach attempt #1 to patient to follow up on Nurse Line call. No answer. RN Health Coach unable to leave message, answering machine did not pick up.  Plan:  RN Health Coach to make another outreach attempt within 3 business days.   [place] [phone] [email or web address]

## 2017-01-25 ENCOUNTER — Encounter (HOSPITAL_COMMUNITY): Payer: Self-pay | Admitting: Internal Medicine

## 2017-01-26 ENCOUNTER — Other Ambulatory Visit: Payer: Self-pay | Admitting: *Deleted

## 2017-01-26 ENCOUNTER — Other Ambulatory Visit (HOSPITAL_COMMUNITY): Payer: Self-pay | Admitting: Interventional Radiology

## 2017-01-26 DIAGNOSIS — S22080A Wedge compression fracture of T11-T12 vertebra, initial encounter for closed fracture: Secondary | ICD-10-CM

## 2017-01-26 NOTE — Patient Outreach (Signed)
[place] [facility]) Care Management  01/26/2017  YUDIT MODESITT 18-Jan-1941 [id number]   Telephone Screen  Referral Date:  01/24/2017 Referral Source: Nurse Line Follow Up Call Reason for Referral: severe abdominal pain, severe back pain Insurance: [employer]   Outreach Attempt: Outreach attempt #2 to patient to follow up on Nurse Line call. No answer. RN Health Coach unable to leave message, answering machine did not pick up.  Plan:  RN Health Coach to make another outreach attempt within 3 business days.   [place] [phone] [email or web address]

## 2017-01-27 ENCOUNTER — Other Ambulatory Visit: Payer: Self-pay | Admitting: *Deleted

## 2017-01-27 ENCOUNTER — Encounter: Payer: Self-pay | Admitting: *Deleted

## 2017-01-27 NOTE — Patient Outreach (Addendum)
[place] [facility]) Care Management  01/27/2017  Glenda Terry 04-21-1940 [id number]  Telephone Screen  Referral Date:  01/24/2017 Referral Source: Nurse Line Follow Up Call Reason for Referral: severe abdominal pain, severe back pain Insurance: [employer]   Outreach Attempt: Successful telephone outreach to patient on the number [phone].  HIPAA verified.  Calling to follow up with patient concerning the 24 hour Nurse Call on 01/21/2017 and 01/22/2017.  Patient stating she is doing better but continues to have some abdominal pain with coughing.  States she has not contacted her primary care physician nor her gastroenterologist concerning the abdominal pain.  Patient encouraged to contact Dr. [name] Crane her Primary Care and Dr. Gala Romney her GI physician.  Patient requesting home health assistance and agreed to the screening process.  Encouraged patient to contact her primary care provider for orders for home health care.  Social: Patient lives at home alone.  Reports needing assistance with her ADLs and IADLs.  With her current T11 compression fracture she is having trouble bathing, dressing, cooking, and cleaning.  States she is ambulating with walker.  Has DME in home including walker, cane, shower chair with back.  Reports having upper dentures and no lower teeth nor lower dentures.  Has limited caregiver resources available.  States she has 2 daughter who work full time and check on her when they can.  Patient states she currently still drives some.  Reports her son-in-law assist her with transportation to her medical appointments.  She does states that she gets nervous driving to out of town appointments.  Conditions:  Per chart review and discussion with patient, PMH include: current T11 compression fracture, anemia, anxiety, arthritis, asthma, chronic back pain, GERD, hypertension, and chronic kidney disease.  Patient with recent EGD on 01/20/2017 that was normal  except medium hiatal hernia.  Developed severe abdominal pain on 11/9 resulting in ED visit.  Per ED studies patient with constipation and new T11 compression fracture.  Patient states she has been having regular bowel movements since visiting the emergency room.  Reports she has sustained 2 falls at home in the past 3 months. Reports she is having difficulties managing at home with current back pain and limited movement related to her compression fracture.  Patient states she has been taking hydrocodone as prescribed for her pain.  Medications: Patient reports taking 15 medications a day.  Denies any issues affording medications.  Does request assistance making sure she is taking medications correctly.  States "I would like someone to come out to make sure I am taking my medications right and at the right time".  Referral for [place] made.  Appointments: Patient reports seeing primary care provider, Dr. [name] Crane on December 23, 2016.  States she has an appointment with Dr. [name] Crane on February 23, 2017.  Follow up appointment with Dr. Gala Romney (GI) is on February 22, 2017.  Reports her follow up appointment for her compression fracture is December 10 with Dr. Estanislado Pandy.  Advanced Directives:  Reports having a Living Will and [place] in place.  Denies wanting to make any changes.   Consent: [facility] services reviewed and discussed with patient.  Patient verbally consents to services.  Patient requesting [place]  Nurse to assist with complex care coordination and safety evaluation for recent T11 compression fracture as a result of multiple falls.  Plan: RN Health Coach will notify [facility] administrative assistant of case status. RN Health Coach will send referral to [facility]  RN for further in home eval/assessment of care needs and management of chronic conditions. RN Health Coach will send [place] referral for medication reconciliation and education. RN Health Coach reinforced the  use of the 24 Hour Nurse line.  [place] [phone] [email or web address]

## 2017-01-28 ENCOUNTER — Other Ambulatory Visit: Payer: Self-pay | Admitting: *Deleted

## 2017-01-28 NOTE — Patient Outreach (Signed)
Telephone call to pt to schedule initial home visit, spoke with pt, HIPAA verified, initial home visit scheduled for next week.  Jacqlyn Larsen [facility], [place] Coordinator [phone]

## 2017-01-31 ENCOUNTER — Ambulatory Visit: Payer: Self-pay | Admitting: *Deleted

## 2017-01-31 ENCOUNTER — Telehealth: Payer: Self-pay | Admitting: Internal Medicine

## 2017-01-31 ENCOUNTER — Other Ambulatory Visit: Payer: Self-pay | Admitting: *Deleted

## 2017-01-31 DIAGNOSIS — M792 Neuralgia and neuritis, unspecified: Secondary | ICD-10-CM | POA: Diagnosis not present

## 2017-01-31 DIAGNOSIS — M545 Low back pain: Secondary | ICD-10-CM | POA: Diagnosis not present

## 2017-01-31 NOTE — Patient Outreach (Signed)
Telephone call from pt stating she needs to cancel today's scheduled home visit because she has severe back pain and is going to see her primary care MD.  RN CM rescheduled home visit for this week.  Jacqlyn Larsen [facility], [place] Coordinator [phone]

## 2017-01-31 NOTE — Telephone Encounter (Signed)
Pt's daughter, [name], called to ask the nurse a question about patient's EGD she had recently. She wants to know if anything was addressed about her having problems with her small intestines. Please advise and call her at [phone] ext [phone]

## 2017-02-01 ENCOUNTER — Other Ambulatory Visit: Payer: Self-pay | Admitting: Pharmacist

## 2017-02-01 NOTE — Telephone Encounter (Signed)
Spoke with pts daughter in reference to her mothers results. Pts daughter will have her mother call back if needed.

## 2017-02-01 NOTE — Patient Outreach (Signed)
Glenda Terry [facility]) Care Management  [place]   02/01/2017  AROUSH Terry 1940/06/21 [id number]  Subjective: 76 year old female referred to [place] Management by 24h nurse line follow-up call. [place] services requested for medication management.  PMHx includes, but not limited to, T11 compression fracture, chronic kidney disease, asthma, COPD, chronic back pain, HTN, GERD, anxiety, allergies, and arthritis. Noted patient had ED visit 9/27 and 11/9 for abdominal pain.   Successful telephone encounter with Glenda Terry today.  HIPAA identifiers verified. Glenda Terry gave consent for [facility] pharmacy services.  She reports she would like help reviewing what time to take her medications and if she needs to space OTC vitamins apart from her prescription medications.  She would prefer a face-to-face visit and does not want a joint visit with [facility] RN as this "would be too confusing." I provided my telephone number if Glenda Terry needs to reach out to me before our scheduled home visit next week.   Plan: Home visit with Glenda Terry on Thursday, November 29, at [place], PharmD, [place] [phone]

## 2017-02-02 ENCOUNTER — Telehealth: Payer: Self-pay | Admitting: Internal Medicine

## 2017-02-02 ENCOUNTER — Ambulatory Visit: Payer: Self-pay | Admitting: *Deleted

## 2017-02-02 ENCOUNTER — Ambulatory Visit (HOSPITAL_COMMUNITY)
Admission: RE | Admit: 2017-02-02 | Discharge: 2017-02-02 | Disposition: A | Discharge status: Home / Self Care | Payer: PPO | Source: Ambulatory Visit | Attending: Interventional Radiology | Admitting: Interventional Radiology

## 2017-02-02 ENCOUNTER — Other Ambulatory Visit: Payer: Self-pay | Admitting: *Deleted

## 2017-02-02 ENCOUNTER — Other Ambulatory Visit (HOSPITAL_COMMUNITY): Payer: Self-pay | Admitting: Interventional Radiology

## 2017-02-02 DIAGNOSIS — M4850XA Collapsed vertebra, not elsewhere classified, site unspecified, initial encounter for fracture: Secondary | ICD-10-CM

## 2017-02-02 DIAGNOSIS — S22080A Wedge compression fracture of T11-T12 vertebra, initial encounter for closed fracture: Secondary | ICD-10-CM

## 2017-02-02 DIAGNOSIS — M4854XA Collapsed vertebra, not elsewhere classified, thoracic region, initial encounter for fracture: Secondary | ICD-10-CM | POA: Diagnosis not present

## 2017-02-02 DIAGNOSIS — M546 Pain in thoracic spine: Secondary | ICD-10-CM

## 2017-02-02 HISTORY — PX: IR RADIOLOGIST EVAL & MGMT: IMG5224

## 2017-02-02 NOTE — Telephone Encounter (Signed)
Pt is stopping by the office on her way home from [place] to see if she can get more Linzess samples.

## 2017-02-02 NOTE — Telephone Encounter (Signed)
Samples left up front for pt pickup

## 2017-02-02 NOTE — Patient Outreach (Signed)
02/01/17- Pt called RN CM and cancelled initial home visit for today 02/02/17 stating " I'm going to see a specialist about my back pain and I won't be home"  Home visit rescheduled for next week.  Jacqlyn Larsen [facility], [place] Coordinator [phone]

## 2017-02-04 ENCOUNTER — Encounter (HOSPITAL_COMMUNITY): Payer: Self-pay | Admitting: Interventional Radiology

## 2017-02-06 ENCOUNTER — Other Ambulatory Visit: Payer: Self-pay | Admitting: Radiology

## 2017-02-07 ENCOUNTER — Other Ambulatory Visit: Payer: Self-pay | Admitting: Student

## 2017-02-08 ENCOUNTER — Encounter (HOSPITAL_COMMUNITY): Payer: Self-pay

## 2017-02-08 ENCOUNTER — Ambulatory Visit (HOSPITAL_COMMUNITY)
Admission: RE | Admit: 2017-02-08 | Discharge: 2017-02-08 | Disposition: A | Discharge status: Home / Self Care | Payer: PPO | Source: Ambulatory Visit | Attending: Interventional Radiology | Admitting: Interventional Radiology

## 2017-02-08 ENCOUNTER — Other Ambulatory Visit (HOSPITAL_COMMUNITY): Payer: Self-pay | Admitting: Interventional Radiology

## 2017-02-08 DIAGNOSIS — Z87891 Personal history of nicotine dependence: Secondary | ICD-10-CM | POA: Diagnosis not present

## 2017-02-08 DIAGNOSIS — J45909 Unspecified asthma, uncomplicated: Secondary | ICD-10-CM | POA: Insufficient documentation

## 2017-02-08 DIAGNOSIS — M064 Inflammatory polyarthropathy: Secondary | ICD-10-CM | POA: Diagnosis not present

## 2017-02-08 DIAGNOSIS — M546 Pain in thoracic spine: Secondary | ICD-10-CM

## 2017-02-08 DIAGNOSIS — I10 Essential (primary) hypertension: Secondary | ICD-10-CM | POA: Diagnosis not present

## 2017-02-08 DIAGNOSIS — K219 Gastro-esophageal reflux disease without esophagitis: Secondary | ICD-10-CM | POA: Insufficient documentation

## 2017-02-08 DIAGNOSIS — F419 Anxiety disorder, unspecified: Secondary | ICD-10-CM | POA: Diagnosis not present

## 2017-02-08 DIAGNOSIS — G8929 Other chronic pain: Secondary | ICD-10-CM | POA: Insufficient documentation

## 2017-02-08 DIAGNOSIS — M4854XA Collapsed vertebra, not elsewhere classified, thoracic region, initial encounter for fracture: Secondary | ICD-10-CM | POA: Insufficient documentation

## 2017-02-08 DIAGNOSIS — M4850XA Collapsed vertebra, not elsewhere classified, site unspecified, initial encounter for fracture: Secondary | ICD-10-CM

## 2017-02-08 HISTORY — PX: IR KYPHO THORACIC WITH BONE BIOPSY: IMG5518

## 2017-02-08 LAB — CBC
HCT: 34 % — ABNORMAL LOW (ref 36.0–46.0)
HEMOGLOBIN: 10.4 g/dL — AB (ref 12.0–15.0)
MCH: 24.8 pg — AB (ref 26.0–34.0)
MCHC: 30.6 g/dL (ref 30.0–36.0)
MCV: 81 fL (ref 78.0–100.0)
Platelets: 324 10*3/uL (ref 150–400)
RBC: 4.2 MIL/uL (ref 3.87–5.11)
RDW: 15.9 % — ABNORMAL HIGH (ref 11.5–15.5)
WBC: 7.6 10*3/uL (ref 4.0–10.5)

## 2017-02-08 LAB — BASIC METABOLIC PANEL
ANION GAP: 9 (ref 5–15)
BUN: 20 mg/dL (ref 6–20)
CALCIUM: 9.5 mg/dL (ref 8.9–10.3)
CO2: 26 mmol/L (ref 22–32)
CREATININE: 1.55 mg/dL — AB (ref 0.44–1.00)
Chloride: 101 mmol/L (ref 101–111)
GFR, EST AFRICAN AMERICAN: 37 mL/min — AB (ref >60)
GFR, EST NON AFRICAN AMERICAN: 32 mL/min — AB (ref >60)
GLUCOSE: 99 mg/dL (ref 65–99)
Potassium: 3.3 mmol/L — ABNORMAL LOW (ref 3.5–5.1)
Sodium: 136 mmol/L (ref 135–145)

## 2017-02-08 LAB — PROTIME-INR
INR: 0.98
Prothrombin Time: 12.9 seconds (ref 11.4–15.2)

## 2017-02-08 LAB — APTT: APTT: 27 s (ref 24–36)

## 2017-02-08 MED ORDER — SODIUM CHLORIDE 0.9 % IV SOLN: INTRAVENOUS | Status: DC

## 2017-02-08 MED ORDER — IOPAMIDOL (ISOVUE-300) INJECTION 61%
INTRAVENOUS | Status: AC
  Administered 2017-02-08: 5 mL
  Filled 2017-02-08: qty 50

## 2017-02-08 MED ORDER — BUPIVACAINE HCL (PF) 0.5 % IJ SOLN
INTRAMUSCULAR | Status: AC
  Filled 2017-02-08: qty 30

## 2017-02-08 MED ORDER — TOBRAMYCIN SULFATE 1.2 G IJ SOLR
INTRAMUSCULAR | Status: AC
  Filled 2017-02-08: qty 1.2

## 2017-02-08 MED ORDER — MIDAZOLAM HCL 2 MG/2ML IJ SOLN
INTRAMUSCULAR | Status: AC
  Filled 2017-02-08: qty 2

## 2017-02-08 MED ORDER — MIDAZOLAM HCL 2 MG/2ML IJ SOLN
INTRAMUSCULAR | Status: AC | PRN
  Administered 2017-02-08 (×2): 1 mg via INTRAVENOUS

## 2017-02-08 MED ORDER — HYDROCODONE-ACETAMINOPHEN 5-325 MG PO TABS
1.0000 | ORAL_TABLET | Freq: Once | ORAL | Status: AC
Start: 2017-02-08 — End: 2017-02-08
  Administered 2017-02-08: 1 via ORAL

## 2017-02-08 MED ORDER — FENTANYL CITRATE (PF) 100 MCG/2ML IJ SOLN
INTRAMUSCULAR | Status: AC | PRN
  Administered 2017-02-08 (×2): 25 ug via INTRAVENOUS
  Administered 2017-02-08: 50 ug via INTRAVENOUS

## 2017-02-08 MED ORDER — HYDROCODONE-ACETAMINOPHEN 5-325 MG PO TABS
ORAL_TABLET | ORAL | Status: AC
  Filled 2017-02-08: qty 1

## 2017-02-08 MED ORDER — VANCOMYCIN HCL IN DEXTROSE 1-5 GM/200ML-% IV SOLN
1000.0000 mg | INTRAVENOUS | Status: AC
  Administered 2017-02-08: 1000 mg via INTRAVENOUS

## 2017-02-08 MED ORDER — VANCOMYCIN HCL IN DEXTROSE 1-5 GM/200ML-% IV SOLN
INTRAVENOUS | Status: AC
  Administered 2017-02-08: 1000 mg via INTRAVENOUS
  Filled 2017-02-08: qty 200

## 2017-02-08 MED ORDER — BUPIVACAINE HCL (PF) 0.5 % IJ SOLN
INTRAMUSCULAR | Status: AC | PRN
  Administered 2017-02-08: 15 mL

## 2017-02-08 MED ORDER — SODIUM CHLORIDE 0.9 % IV SOLN
INTRAVENOUS | Status: AC | PRN
  Administered 2017-02-08: 10 mL/h via INTRAVENOUS

## 2017-02-08 MED ORDER — FENTANYL CITRATE (PF) 100 MCG/2ML IJ SOLN
INTRAMUSCULAR | Status: AC
  Filled 2017-02-08: qty 2

## 2017-02-08 NOTE — Sedation Documentation (Signed)
Patient is resting comfortably. 

## 2017-02-08 NOTE — Procedures (Signed)
S/P T 11 balloon KP 

## 2017-02-08 NOTE — Discharge Instructions (Signed)
1. No stooping ,bending orlifting more than 10 lbs for 2 weeks. 2.Use walker to ambulate for 2 weeks  3.RTC 2 weeks PRN   Balloon Kyphoplasty, Care After Refer to this sheet in the next few weeks. These instructions provide you with information about caring for yourself after your procedure. Your health care provider may also give you more specific instructions. Your treatment has been planned according to current medical practices, but problems sometimes occur. Call your health care provider if you have any problems or questions after your procedure. What can I expect after the procedure? After your procedure, it is common to have back pain. Follow these instructions at home: Incision care  Follow instructions from your health care provider about how to take care of your incisions. Make sure you: ? Wash your hands with soap and water before you change your bandage (dressing). If soap and water are not available, use hand sanitizer. ? Change your dressing as told by your health care provider. ? Leave stitches (sutures), skin glue, or adhesive strips in place. These skin closures may need to be in place for 2 weeks or longer. If adhesive strip edges start to loosen and curl up, you may trim the loose edges. Do not remove adhesive strips completely unless your health care provider tells you to do that.  Check your incision area every day for signs of infection. Watch for: ? Redness, swelling, or pain. ? Fluid, blood, or pus.  Keep your dressing dry until your health care provider says that it can be removed. Activity   Rest your back and avoid intense physical activity for as long as told by your health care provider.  Return to your normal activities as told by your health care provider. Ask your health care provider what activities are safe for you.  Do not lift anything that is heavier than 10 lb (4.5 kg). This is about the weight of a gallon of milk.You may need to avoid heavy lifting  for several weeks. General instructions  Take over-the-counter and prescription medicines only as told by your health care provider.  If directed, apply ice to the painful area: ? Put ice in a plastic bag. ? Place a towel between your skin and the bag. ? Leave the ice on for 20 minutes, 2-3 times per day.  Do not use tobacco products, including cigarettes, chewing tobacco, or e-cigarettes. If you need help quitting, ask your health care provider.  Keep all follow-up visits as told by your health care provider. This is important. Contact a health care provider if:  You have a fever.  You have redness, swelling, or pain at the site of your incisions.  You have fluid, blood, or pus coming from your incisions.  You have pain that gets worse or does not get better with medicine.  You develop numbness or weakness in any part of your body. Get help right away if:  You have chest pain.  You have difficulty breathing.  You cannot move your legs.  You cannot control your bladder or bowel movements.  You suddenly become weak or numb on one side of your body.  You become very confused.  You have trouble speaking or understanding, or both. This information is not intended to replace advice given to you by your health care provider. Make sure you discuss any questions you have with your health care provider. Document Released: 11/20/2014 Document Revised: 08/07/2015 Document Reviewed: 06/24/2014 Elsevier Interactive Patient Education  [employer].

## 2017-02-08 NOTE — H&P (Signed)
Chief Complaint: T11 compression fracture  Referring Physician:Dr. Allyn Kenner  Supervising Physician: Luanne Bras  [facility]  HPI: Glenda Terry is a 76 y.o. female who was just seen by Dr. Estanislado Pandy on 02-02-17 for back pain.  Please see his note for complete details.  She denies any new medical changes since this appointment.  She continues to have back pain.  Past Medical History:  Past Medical History:  Diagnosis Date  . Anemia NOV 2013  . Anxiety   . Arthritis    inflammatory arthritis  . Asthma 1991   not COPD per [facility]  . Back pain, chronic   . Complication of anesthesia 1985   VITALS SIGNS DROPPED AND COULD NOT TAKE PAIN MEDS UNTIL VITALS IMPROVED  . GERD (gastroesophageal reflux disease)   . HOH (hard of hearing)   . Hypertension   . Tubular adenoma 11/2012    Past Surgical History:  Past Surgical History:  Procedure Laterality Date  . ABDOMINAL HYSTERECTOMY    . BACK SURGERY    . CARPAL TUNNEL RELEASE Right 4/14  . CATARACT EXTRACTION W/PHACO Left 11/19/2014   Procedure: CATARACT EXTRACTION PHACO AND INTRAOCULAR LENS PLACEMENT (IOC);  Surgeon: Williams Che, MD;  Location: [facility];  Service: Ophthalmology;  Laterality: Left;  CDE: 4.77  . CATARACT EXTRACTION W/PHACO Right 02/03/2015   Procedure: CATARACT EXTRACTION PHACO AND INTRAOCULAR LENS PLACEMENT ([place]);  Surgeon: Williams Che, MD;  Location: [facility];  Service: Ophthalmology;  Laterality: Right;  CDE: 4.54  . COLONOSCOPY  06/19/2008   RMR: tortuous and elongated colon with scattered left-sided diverticula/colonic mucosa appeared entirely normal. Prior colonic ulcers had resolved.  . COLONOSCOPY  12/2007   Dr. Gala Romney: Scattered diffuse sigmoid diverticula, 2 areas of ulceration at the hepatic flexure. Biopsies unremarkable.  . COLONOSCOPY N/A 11/20/2012   next TCS 11/2017  . DECOMPRESSIVE LUMBAR LAMINECTOMY LEVEL 2  04/20/2012   Procedure: DECOMPRESSIVE LUMBAR  LAMINECTOMY LEVEL 2;  Surgeon: Tobi Bastos, MD;  Location: [facility];  Service: Orthopedics;  Laterality: Right;  Decompressive Lumbar Laminectomy of the L4 - L5 and L5 - S1 Complete/Laminectomy L5 on the Right (X-Ray)  . ESOPHAGOGASTRODUODENOSCOPY  12/2007   Dr. 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 Small hiatal hernia. Slightly pale duodenal mucosa (biopsy unremarkable)  . ESOPHAGOGASTRODUODENOSCOPY (EGD) WITH PROPOFOL N/A 01/20/2017   Procedure: ESOPHAGOGASTRODUODENOSCOPY (EGD) WITH PROPOFOL;  Surgeon: Daneil Dolin, MD;  Location: [facility];  Service: Endoscopy;  Laterality: N/A;  12:30pm  . IR RADIOLOGIST EVAL & MGMT  02/02/2017  . KNEE ARTHROSCOPY Right   . MALONEY DILATION N/A 01/20/2017   Procedure: Venia Minks DILATION;  Surgeon: Daneil Dolin, MD;  Location: [facility];  Service: Endoscopy;  Laterality: N/A;  . REVERSE SHOULDER ARTHROPLASTY Right 05/24/2014   Procedure: RIGHT SHOULDER REVERSE ARTHROPLASTY;  Surgeon: Netta Cedars, MD;  Location: [place];  Service: Orthopedics;  Laterality: Right;  . TOTAL KNEE ARTHROPLASTY  01/24/2012   Procedure: TOTAL KNEE ARTHROPLASTY;  Surgeon: Gearlean Alf, MD;  Location: [facility];  Service: Orthopedics;  Laterality: Right;  . TUBAL LIGATION      Family History:  Family History  Problem Relation Age of Onset  . Breast cancer Sister   . Colon cancer Sister        two deceased, one living and terminal, one current undergoing treatment, ages 32, 67, 71, 76    Social History:  reports that  she quit smoking about 25 years ago. Her smoking use included cigarettes. She has a 80.00 pack-year smoking history. she has never used smokeless tobacco. She reports that she does not drink alcohol or use drugs.  Allergies:  Allergies  Allergen Reactions  . Flexeril [Cyclobenzaprine] Anaphylaxis  . Other Anaphylaxis and Other (See Comments)    ALL TREE NUTS  . Keflex [Cephalexin] Itching  . Aspirin  Other (See Comments)    Cannot take due to ulcers  . Statins Other (See Comments)    is pain and swelling   . Adhesive [Tape] Rash  . Chlorhexidine Rash    Medications: Medications reviewed in epic  Please HPI for pertinent positives, otherwise complete 10 system ROS negative.  Mallampati Score: MD Evaluation Airway: WNL Heart: WNL Abdomen: WNL Chest/ Lungs: WNL ASA  Classification: 2 Mallampati/Airway Score: Two  Physical Exam: BP (!) 151/85   Pulse 85   Temp 97.7 F (36.5 C) (Oral)   Resp 18   Ht 5' (1.524 m)   Wt 199 lb (90.3 kg)   SpO2 99%   BMI 38.86 kg/m  Body mass index is 38.86 kg/m. General: pleasant, WD, WN black female who is laying in bed in NAD HEENT: head is normocephalic, atraumatic.  Sclera are noninjected.  PERRL.  Ears and nose without any masses or lesions.  Mouth is pink and moist Heart: regular, rate, and rhythm.  Normal s1,s2. No obvious murmurs, gallops, or rubs noted.  Palpable radial pulses bilaterally Lungs: CTAB, no wheezes, rhonchi, or rales noted.  Respiratory effort nonlabored Abd: soft, mild tenderness in RLQ (states she hit her side when she fell and had a bruise), ND, +BS, no masses, hernias, or organomegaly Psych: A&Ox3 with an appropriate affect.   Labs: Results for orders placed or performed during the hospital encounter of 02/08/17 (from the past 48 hour(s))  APTT     Status: None   Collection Time: 02/08/17  7:26 AM  Result Value Ref Range   aPTT 27 24 - 36 seconds  CBC     Status: Abnormal   Collection Time: 02/08/17  7:26 AM  Result Value Ref Range   WBC 7.6 4.0 - 10.5 K/uL   RBC 4.20 3.87 - 5.11 MIL/uL   Hemoglobin 10.4 (L) 12.0 - 15.0 g/dL   HCT 34.0 (L) 36.0 - 46.0 %   MCV 81.0 78.0 - 100.0 fL   MCH 24.8 (L) 26.0 - 34.0 pg   MCHC 30.6 30.0 - 36.0 g/dL   RDW 15.9 (H) 11.5 - 15.5 %   Platelets 324 150 - 400 K/uL  Protime-INR     Status: None   Collection Time: 02/08/17  7:26 AM  Result Value Ref Range    Prothrombin Time 12.9 11.4 - 15.2 seconds   INR 0.98     Imaging: No results found.  Assessment/Plan 1. T11 compression fracture  Patient presents today for KP vs VP of this fracture.  She is still in pain and using her walker, which we have discussed she will still need to use this some after the procedure. Risks and benefits of kyphoplasty vs vertebroplasty were discussed with the patient including, but not limited to education regarding the natural healing process of compression fractures without intervention, bleeding, infection, cement migration which may cause spinal cord damage, paralysis, pulmonary embolism or even death.  This interventional procedure involves the use of X-rays and because of the nature of the planned procedure, it is possible that we will have prolonged  use of X-ray fluoroscopy.  Potential radiation risks to you include (but are not limited to) the following: - A slightly elevated risk for cancer  several years later in life. This risk is typically less than 0.5% percent. This risk is low in comparison to the normal incidence of human cancer, which is 33% for women and 50% for men according to the [place]. - Radiation induced injury can include skin redness, resembling a rash, tissue breakdown / ulcers and hair loss (which can be temporary or permanent).   The likelihood of either of these occurring depends on the difficulty of the procedure and whether you are sensitive to radiation due to previous procedures, disease, or genetic conditions.   IF your procedure requires a prolonged use of radiation, you will be notified and given written instructions for further action.  It is your responsibility to monitor the irradiated area for the 2 weeks following the procedure and to notify your physician if you are concerned that you have suffered a radiation induced injury.    All of the patient's questions were answered, patient is agreeable to  proceed.  Consent signed and in chart.   Thank you for this interesting consult.  I greatly enjoyed meeting Glenda Terry and look forward to participating in their care.  A copy of this report was sent to the requesting provider on this date.  Electronically Signed: Henreitta Cea 02/08/2017, 8:28 AM   I spent a total of    25 Minutes in face to face in clinical consultation, greater than 50% of which was counseling/coordinating care for T 11 compression fracture

## 2017-02-09 ENCOUNTER — Encounter (HOSPITAL_COMMUNITY): Payer: Self-pay | Admitting: Interventional Radiology

## 2017-02-10 ENCOUNTER — Encounter: Payer: Self-pay | Admitting: *Deleted

## 2017-02-10 ENCOUNTER — Other Ambulatory Visit: Payer: Self-pay | Admitting: *Deleted

## 2017-02-10 ENCOUNTER — Telehealth (HOSPITAL_COMMUNITY): Payer: Self-pay

## 2017-02-10 ENCOUNTER — Other Ambulatory Visit: Payer: Self-pay | Admitting: Pharmacist

## 2017-02-10 DIAGNOSIS — F419 Anxiety disorder, unspecified: Secondary | ICD-10-CM

## 2017-02-10 NOTE — Telephone Encounter (Signed)
Called daughter [name] to schedule f/u, left message for her to return call. AW

## 2017-02-10 NOTE — Patient Outreach (Signed)
[facility]) Care Management  [place]   02/10/2017  Glenda Terry 11-12-1940 [id number]  76year old femalereferred to [facility] by 24h nurse line follow-up call.[facility] services requested for medication management. PMHx includes, but not limited to, T11 compression fracture, chronic kidney disease, asthma, COPD, chronic back pain, HTN, GERD, anxiety, allergies, and arthritis. Noted patient had ED visit 9/27 and 11/9 for abdominal pain.   Subjective: Home visit today with [name] at her residence to review her medications.  HIPAA identifiers verified. [name] reports she recently had a kyphoplasty 2 days prior on 02/08/2017 for a T11 compression fracture.  She reports she is feeling much better after the procedure.  She has all her medications, prescription and OTC, on the kitchen table.  She reports she puts stickers on specific bottles (top, bottom, and on the side) to differentiate these medications at night if she cannot see well.    Objective:  Notable Labs 02/08/2017: SCr = 1.55, CrCl = 45 ml/min (weight = 90.3kg) Potassium 3.3  Encounter Medications: Outpatient Encounter Medications as of 02/10/2017  Medication Sig  . albuterol (PROAIR HFA) 108 (90 BASE) MCG/ACT inhaler Inhale 2 puffs into the lungs every 6 (six) hours as needed for wheezing or shortness of breath. For COPD  . ALPRAZolam (XANAX) 0.5 MG tablet Take 0.25-0.5 mg by mouth 2 (two) times daily as needed for anxiety.   Marland Kitchen Benralizumab (FASENRA) 30 MG/ML SOSY Inject into the skin every 8 (eight) weeks.  . budesonide-formoterol (SYMBICORT) 160-4.5 MCG/ACT inhaler Inhale 2 puffs into the lungs 2 (two) times daily.  . cetirizine (ZYRTEC) 10 MG tablet Take 10 mg by mouth at bedtime.   Marland Kitchen EPINEPHrine (EPIPEN 2-PAK) 0.3 mg/0.3 mL DEVI Inject 0.3 mg into the muscle once.   Marland Kitchen esomeprazole (NEXIUM) 40 MG capsule Take 40 mg by mouth daily at 12 noon.  . ferrous  sulfate 325 (65 FE) MG tablet Take 325 mg by mouth daily with breakfast.  . fluticasone (FLONASE) 50 MCG/ACT nasal spray Place 1 spray into both nostrils 2 (two) times daily.  . folic acid (FOLVITE) 1 MG tablet Take 1 mg by mouth daily.  Marland Kitchen HYDROcodone-acetaminophen (NORCO/VICODIN) 5-325 MG tablet Take 1-2 tablets every 6 (six) hours as needed by mouth. (Patient taking differently: Take 1-2 tablets by mouth every 6 (six) hours as needed for moderate pain. )  . ipratropium-albuterol (DUONEB) 0.5-2.5 (3) MG/3ML SOLN Take 3 mLs by nebulization 2 (two) times daily.   Marland Kitchen ketotifen (ZADITOR) 0.025 % ophthalmic solution Place 1 drop into both eyes 2 (two) times daily.  . montelukast (SINGULAIR) 10 MG tablet Take 10 mg by mouth daily.   Marland Kitchen olmesartan (BENICAR) 40 MG tablet Take 40 mg by mouth daily.  Vladimir Faster Glycol-Propyl Glycol (SYSTANE OP) Place 1 drop into both eyes 2 (two) times daily.  . polyethylene glycol (MIRALAX) packet Take 17 g daily by mouth.  . potassium chloride (K-DUR) 10 MEQ tablet Take 10 mEq by mouth See admin instructions. Take 10 meq by mouth once daily for 7 days then off for 7 days, then restart  . predniSONE (DELTASONE) 5 MG tablet Take 5 mg by mouth daily with breakfast.   . torsemide (DEMADEX) 10 MG tablet Take 10 mg by mouth See admin instructions. Take 10 mg by mouth daily for 7 days then off for 7 days, then restart   No facility-administered encounter medications on file as of 02/10/2017.    Patient was recently  discharged from hospital and all medications have been reviewed.  ASSESSMENT: Date Discharged from Hospital: 11/27 Date Medication Reconciliation Performed: 02/10/17  Medications Discontinued at Discharge:  None  New Medications at Discharge:   None  Continued Medications at Discharge:  Alprazolam  Budesonide-formoterol  Cetirizine  Epipen  Esomeprazole  Fasenra  Ferrous sulfate  Fluticasone NS  Folic  acid  Hydrocodone-acetaminophen  Ipratropium-albuterol nebulizers  Ketotifen eye drops  Montelukast  Olmesartan  Polyethylene glycol  Potassium chloride  Prednisone  ProAir HFA  Systane eye drops  Torsemide   Drugs sorted by system:  Neurologic/Psychologic: Alprazolam  Cardiovascular:olmesartan, torsemide  Pulmonary/Allergy: Budesonide-formoterol, cetirizine, epipen, ipratropium-albuterol, montelukast, albuterol ProAir, Fasenra  Gastrointestinal:esomeprazole, polyethylene glycol  Endocrine: prednisone  Topical: fluticasone NS, ketotifen, systane  Pain: hydrocodone-acetaminophen  Vitamins/Minerals: ferrous sulfate, folic acid, potassium  Renal dosing: No issues identified Gaps in therapy: No issues identified Duplications in therapy: No issues identified  Medications to avoid in the elderly: Alprazolam: This drug is identified in the [place] Criteria as a potentially inappropriate medication to be avoided in patients 65 years and older (independent of diagnosis or condition) due to increased risk of impaired cognition, delirium, falls, fractures, and motor vehicle accidents with benzodiazepine use. Address as clinically warranted.   Drug interactions: No issues identified  Other issues noted:  -Currently on Nexium 40mg  daily for GERD, per PI, usual dose for chronic use is 20mg .  Consider reducing dose as clinically warranted.   -Currently taking Cetirizine 10mg  daily for allergies.  Per PI, geriatric dose recommended is 5mg  once daily.  Consider reducing dose as clinically warranted.   -Not taking magnesium currently due to experiencing uncontrolled loose stools.  Patient reports she may resume at half dose after she stops her pain medicine and docusate/senna regimen.   -Patient needs refills on several medications (olmesartan, budesonide-formoterol, esomeprazole, cetrizine, torsemide, potassium).  Refills called in to pharmacy, [facility], during visit.   Patient will pick up medications on Monday when she is in town for her office visit with her cardiologist.    Plan: Medications reviewed with Ms. Gloster .  All questions answered.  I will route my note to PCP, Dr. [name] Crane.  I will make referral to [facility] LCSW regarding dental resources.  I will close patient's pharmacy case at this time.  I am happy to assist in the future as needed.   Thank you for allowing [place] services to be part of this patient's care.   Ralene Bathe, PharmD, [place] [phone]             Assessment:  Plan:

## 2017-02-10 NOTE — Patient Outreach (Signed)
[facility]) Care Management   02/10/2017  Glenda Terry Jul 15, 1940 [id number]  Glenda Terry is an 76 y.o. female  Subjective: Initial home visit with pt, HIPAA verified, pt reports " I had kyphoplasty since I've talked to you and hopefully this will help my back pain"  Pt saw primary MD yesterday and to follow up again in few weeks, is seeing dietician for assistance with weight loss, has appointment scheduled to see cardiologist Dr. Domenic Polite "because my heart was being too fast"  Pt states " my biggest problem is I want this back pain to go away"  Reports " my asthma is under control".  Objective:   Vitals:   02/10/17 1231  BP: 128/64  Pulse: 93  Resp: 16  SpO2: 98%  Weight: 199 lb (90.3 kg)  Height: 1.524 m (5')   ROS  Physical Exam  Constitutional: She is oriented to person, place, and time. She appears well-developed and well-nourished.  HENT:  Head: Normocephalic.  Neck: Normal range of motion. Neck supple.  Cardiovascular: Normal rate and regular rhythm.  Respiratory: Effort normal and breath sounds normal.  dypsnea with exertion  GI: Soft. Bowel sounds are normal.  Musculoskeletal: She exhibits no edema.  Pt has fat areas to ankles bil, no edema in feet  Neurological: She is alert and oriented to person, place, and time.  Skin: Skin is warm and dry.  2 small puncture wounds to mid back appear healed, no drainage, no signs/ symptoms infection.  Psychiatric: She has a normal mood and affect. Her behavior is normal. Judgment and thought content normal.    Encounter Medications:   Outpatient Encounter Medications as of 02/10/2017  Medication Sig Note  . albuterol (PROAIR HFA) 108 (90 BASE) MCG/ACT inhaler Inhale 2 puffs into the lungs every 6 (six) hours as needed for wheezing or shortness of breath. For COPD   . ALPRAZolam (XANAX) 0.5 MG tablet Take 0.25-0.5 mg by mouth 2 (two) times daily as needed for anxiety.    Marland Kitchen Benralizumab  (FASENRA) 30 MG/ML SOSY Inject into the skin every 8 (eight) weeks.   . budesonide-formoterol (SYMBICORT) 160-4.5 MCG/ACT inhaler Inhale 2 puffs into the lungs 2 (two) times daily.   . cetirizine (ZYRTEC) 10 MG tablet Take 10 mg by mouth at bedtime.    Marland Kitchen EPINEPHrine (EPIPEN 2-PAK) 0.3 mg/0.3 mL DEVI Inject 0.3 mg into the muscle once.    Marland Kitchen esomeprazole (NEXIUM) 40 MG capsule Take 40 mg by mouth daily at 12 noon.   . fluticasone (FLONASE) 50 MCG/ACT nasal spray Place 1 spray into both nostrils 2 (two) times daily. 02/10/2017: Taking once daily  . folic acid (FOLVITE) 1 MG tablet Take 1 mg by mouth daily.   Marland Kitchen HYDROcodone-acetaminophen (NORCO/VICODIN) 5-325 MG tablet Take 1-2 tablets every 6 (six) hours as needed by mouth. (Patient taking differently: Take 1-2 tablets by mouth every 6 (six) hours as needed for moderate pain. )   . ipratropium-albuterol (DUONEB) 0.5-2.5 (3) MG/3ML SOLN Take 3 mLs by nebulization 2 (two) times daily.    Marland Kitchen ketotifen (ZADITOR) 0.025 % ophthalmic solution Place 1 drop into both eyes 2 (two) times daily.   . montelukast (SINGULAIR) 10 MG tablet Take 10 mg by mouth daily.    Marland Kitchen olmesartan (BENICAR) 40 MG tablet Take 40 mg by mouth daily.   Vladimir Faster Glycol-Propyl Glycol (SYSTANE OP) Place 1 drop into both eyes 2 (two) times daily.   . polyethylene glycol (MIRALAX) packet Take 17 g  daily by mouth.   . potassium chloride (K-DUR) 10 MEQ tablet Take 10 mEq by mouth See admin instructions. Take 10 meq by mouth once daily for 7 days then off for 7 days, then restart   . predniSONE (DELTASONE) 5 MG tablet Take 5 mg by mouth daily with breakfast.    . torsemide (DEMADEX) 10 MG tablet Take 10 mg by mouth See admin instructions. Take 10 mg by mouth daily for 7 days then off for 7 days, then restart   . Turmeric 500 MG CAPS Take 1 capsule by mouth daily.   . ferrous sulfate 325 (65 FE) MG tablet Take 325 mg by mouth daily with breakfast.    No facility-administered encounter  medications on file as of 02/10/2017.     Functional Status:   In your present state of health, do you have any difficulty performing the following activities: 02/10/2017 02/08/2017  Hearing? N N  Vision? N N  Difficulty concentrating or making decisions? N N  Walking or climbing stairs? Y N  Dressing or bathing? Y N  Doing errands, shopping? Y -  Conservation officer, nature and eating ? N -  Using the Toilet? N -  In the past six months, have you accidently leaked urine? Y -  Comment stress incontinence- per pt -  Do you have problems with loss of bowel control? N -  Managing your Medications? N -  Managing your Finances? N -  Housekeeping or managing your Housekeeping? Y -  Some recent data might be hidden    Fall/Depression Screening:    Fall Risk  02/10/2017 01/27/2017 12/29/2016  Falls in the past year? Yes Yes Yes  Number falls in past yr: 2 or more 2 or more 1  Injury with Fall? No Yes -  Risk Factor Category  High Fall Risk High Fall Risk -  Risk for fall due to : History of fall(s);Impaired balance/gait;Impaired mobility History of fall(s);Impaired balance/gait;Impaired mobility Impaired balance/gait;Impaired mobility  Follow up Education provided;Falls prevention discussed Education provided;Falls prevention discussed Falls prevention discussed   PHQ 2/9 Scores 02/10/2017 01/27/2017 12/29/2016 10/03/2015 04/21/2015 04/14/2015 02/10/2015  PHQ - 2 Score 0 5 0 1 2 2 2   PHQ- 9 Score - 7 - - 6 5 6     Assessment:  RN CM reviewed medications with pt,  [facility] pharmacist completed home visit with pt today and per pt also reviewed medications.  RN CM reviewed consent form on file and pt does not wish to make any changes.  RN CM reviewed discharge instructions for kyphoplasty, reviewed pain management.  No home health due to co- pay, pt cannot afford, [facility] pharmacist placed order for social work referral.  RN CM faxed barrier letter and initial home visit to primary MD Dr. [name] Crane.  THN CM Care Plan  Problem One     Most Recent Value  Care Plan Problem One  Decreased quality of life related to chronic pain  Role Documenting the Problem One  Care Management [place] for Problem One  Active  THN Long Term Goal   Pt will verbalize improved quality of life and decreased pain within 60 days  THN Long Term Goal Start Date  02/10/17  Interventions for Problem One Long Term Goal  RN CM reviewed discharge instructions for kyphoplasty, signs/ symptoms infection, no heavy lifting, reviewed how kyphoplasty may help with pain control  THN CM Short Term Goal #1   pt will verbalize strategies to maximize pain control within 30  days  THN CM Short Term Goal #1 Start Date  02/10/17  Interventions for Short Term Goal #1  RN CM reviewed taking pain medication as prescribed, reviewed relaxation and other alternatives such as deep breathing to assist with pain control.    THN CM Care Plan Problem Two     Most Recent Value  Care Plan Problem Two  Pt high risk for falls  Role Documenting the Problem Two  Care Management Coordinator  Care Plan for Problem Two  Active  THN CM Short Term Goal #1   Pt will have no falls within 30 days  THN CM Short Term Goal #1 Start Date  02/10/17  Interventions for Short Term Goal #2   RN CM ask pt to use walker at all times, keep pathyways clear, ask for assistance as needed.      Plan:  See pt for home visit next month  Jacqlyn Larsen [facility], [place] Coordinator [phone]

## 2017-02-11 ENCOUNTER — Other Ambulatory Visit (HOSPITAL_COMMUNITY): Payer: Self-pay | Admitting: Interventional Radiology

## 2017-02-11 ENCOUNTER — Encounter: Payer: Self-pay | Admitting: Cardiology

## 2017-02-11 DIAGNOSIS — S22080A Wedge compression fracture of T11-T12 vertebra, initial encounter for closed fracture: Secondary | ICD-10-CM

## 2017-02-11 NOTE — Progress Notes (Signed)
Cardiology Office Note  Date: 02/14/2017   ID: Glenda Terry, DOB Apr 30, 1940, MRN [id number]  PCP: Celene Squibb, MD  Consulting Cardiologist: Rozann Lesches, MD   Chief Complaint  Patient presents with  . Palpitations    History of Present Illness: Glenda Terry is a 76 y.o. female referred for cardiology consultation by Dr. [name] Crane for evaluation of palpitations and rapid heartbeat.  She is here today with a family member.  She tells me that over the last few months she has felt episodic palpitations, more specifically a feeling of fast heartbeat, not necessarily skips or irregularity.  There is no obvious precipitant that she can recall.  She is not experiencing any chest pain or increased breathlessness with this.  She states that her chronic pulmonary disease has been stable, no recent wheezing or changes in her inhalers.  When seen recently by Dr. [name] Crane in the office her heart rate was in the 120s.  ECG showed sinus tachycardia.  Record review finds previous cardiology evaluation by Dr. Harrington Challenger back in 2014 for palpitations.  She wore a cardiac monitor at that time that revealed sinus rhythm and sinus tachycardia.  LVEF was normal range by echocardiogram obtained last year.  She does not have any history of obvious dysrhythmia or cardiomyopathy.  Past Medical History:  Diagnosis Date  . Anxiety   . Back pain, chronic   . CKD (chronic kidney disease) stage 3, GFR 30-59 ml/min (HCC)   . 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 Essential hypertension   . GERD (gastroesophageal reflux disease)   . HOH (hard of hearing)   . Inflammatory arthritis   . Iron deficiency anemia 01/2012  . 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 Spinal stenosis   . Tubular adenoma 11/2012    Past Surgical History:  Procedure Laterality Date  . ABDOMINAL HYSTERECTOMY    . BACK SURGERY    . CARPAL TUNNEL RELEASE Right 4/14  . CATARACT EXTRACTION W/PHACO Left 11/19/2014   Procedure: CATARACT EXTRACTION PHACO AND  INTRAOCULAR LENS PLACEMENT (IOC);  Surgeon: Williams Che, MD;  Location: [facility];  Service: Ophthalmology;  Laterality: Left;  CDE: 4.77  . CATARACT EXTRACTION W/PHACO Right 02/03/2015   Procedure: CATARACT EXTRACTION PHACO AND INTRAOCULAR LENS PLACEMENT ([place]);  Surgeon: Williams Che, MD;  Location: [facility];  Service: Ophthalmology;  Laterality: Right;  CDE: 4.54  . COLONOSCOPY  06/19/2008   RMR: tortuous and elongated colon with scattered left-sided diverticula/colonic mucosa appeared entirely normal. Prior colonic ulcers had resolved.  . COLONOSCOPY  12/2007   Dr. Gala Romney: Scattered diffuse sigmoid diverticula, 2 areas of ulceration at the hepatic flexure. Biopsies unremarkable.  . COLONOSCOPY N/A 11/20/2012   next TCS 11/2017  . DECOMPRESSIVE LUMBAR LAMINECTOMY LEVEL 2  04/20/2012   Procedure: DECOMPRESSIVE LUMBAR LAMINECTOMY LEVEL 2;  Surgeon: Tobi Bastos, MD;  Location: [facility];  Service: Orthopedics;  Laterality: Right;  Decompressive Lumbar Laminectomy of the L4 - L5 and L5 - S1 Complete/Laminectomy L5 on the Right (X-Ray)  . ESOPHAGOGASTRODUODENOSCOPY  12/2007   Dr. 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 Small hiatal hernia. Slightly pale duodenal mucosa (biopsy unremarkable)  . ESOPHAGOGASTRODUODENOSCOPY (EGD) WITH PROPOFOL N/A 01/20/2017   Procedure: ESOPHAGOGASTRODUODENOSCOPY (EGD) WITH PROPOFOL;  Surgeon: Daneil Dolin, MD;  Location: [facility];  Service: Endoscopy;  Laterality: N/A;  12:30pm  . IR KYPHO THORACIC WITH BONE BIOPSY  02/08/2017  . IR RADIOLOGIST EVAL & MGMT  02/02/2017  .  KNEE ARTHROSCOPY Right   . MALONEY DILATION N/A 01/20/2017   Procedure: Venia Minks DILATION;  Surgeon: Daneil Dolin, MD;  Location: [facility];  Service: Endoscopy;  Laterality: N/A;  . REVERSE SHOULDER ARTHROPLASTY Right 05/24/2014   Procedure: RIGHT SHOULDER REVERSE ARTHROPLASTY;  Surgeon: Netta Cedars, MD;  Location: [facility];  Service:  Orthopedics;  Laterality: Right;  . TOTAL KNEE ARTHROPLASTY  01/24/2012   Procedure: TOTAL KNEE ARTHROPLASTY;  Surgeon: Gearlean Alf, MD;  Location: [facility];  Service: Orthopedics;  Laterality: Right;  . TUBAL LIGATION      Current Outpatient Medications  Medication Sig Dispense Refill  . albuterol (PROAIR HFA) 108 (90 BASE) MCG/ACT inhaler Inhale 2 puffs into the lungs every 6 (six) hours as needed for wheezing or shortness of breath. For COPD    . ALPRAZolam (XANAX) 0.5 MG tablet Take 0.25-0.5 mg by mouth 2 (two) times daily as needed for anxiety.     Marland Kitchen Benralizumab (FASENRA) 30 MG/ML SOSY Inject into the skin every 8 (eight) weeks.    . budesonide-formoterol (SYMBICORT) 160-4.5 MCG/ACT inhaler Inhale 2 puffs into the lungs 2 (two) times daily.    . cetirizine (ZYRTEC) 10 MG tablet Take 10 mg by mouth at bedtime.     Marland Kitchen EPINEPHrine (EPIPEN 2-PAK) 0.3 mg/0.3 mL DEVI Inject 0.3 mg into the muscle once.     Marland Kitchen esomeprazole (NEXIUM) 40 MG capsule Take 40 mg by mouth daily at 12 noon.    . ferrous sulfate 325 (65 FE) MG tablet Take 325 mg by mouth daily with breakfast.    . fluticasone (FLONASE) 50 MCG/ACT nasal spray Place 1 spray into both nostrils 2 (two) times daily.    . folic acid (FOLVITE) 1 MG tablet Take 1 mg by mouth daily.    Marland Kitchen HYDROcodone-acetaminophen (NORCO/VICODIN) 5-325 MG tablet Take 1-2 tablets every 6 (six) hours as needed by mouth. (Patient taking differently: Take 1-2 tablets by mouth every 6 (six) hours as needed for moderate pain. ) 20 tablet 0  . ipratropium-albuterol (DUONEB) 0.5-2.5 (3) MG/3ML SOLN Take 3 mLs by nebulization 2 (two) times daily.     Marland Kitchen ketotifen (ZADITOR) 0.025 % ophthalmic solution Place 1 drop into both eyes 2 (two) times daily.    . Magnesium 250 MG TABS Take 1 tablet by mouth daily.    . montelukast (SINGULAIR) 10 MG tablet Take 10 mg by mouth daily.     Marland Kitchen olmesartan (BENICAR) 40 MG tablet Take 40 mg by mouth daily.    Vladimir Faster Glycol-Propyl  Glycol (SYSTANE OP) Place 1 drop into both eyes 2 (two) times daily.    . polyethylene glycol (MIRALAX) packet Take 17 g daily by mouth. 14 each 2  . potassium chloride (K-DUR) 10 MEQ tablet Take 10 mEq by mouth See admin instructions. Take 10 meq by mouth once daily for 7 days then off for 7 days, then restart    . predniSONE (DELTASONE) 5 MG tablet Take 5 mg by mouth daily with breakfast.     . torsemide (DEMADEX) 10 MG tablet Take 10 mg by mouth See admin instructions. Take 10 mg by mouth daily for 7 days then off for 7 days, then restart    . Turmeric 500 MG CAPS Take 1 capsule by mouth daily.     No current facility-administered medications for this visit.    Allergies:  Flexeril [cyclobenzaprine]; Other; Keflex [cephalexin]; Aspirin; Statins; Adhesive [tape]; and Chlorhexidine   Social History: The patient  reports  that she quit smoking about 25 years ago. Her smoking use included cigarettes. She has a 80.00 pack-year smoking history. she has never used smokeless tobacco. She reports that she does not drink alcohol or use drugs.   Family History: The patient's family history includes Breast cancer in her sister; Colon cancer in her sister; Heart attack in her mother; Hypertension in her father and mother; Prostate cancer in her father; Stroke in her mother.   ROS:  Please see the history of present illness. Otherwise, complete review of systems is positive for chronic back and leg pain, uses a walker.  All other systems are reviewed and negative.   Physical Exam: VS:  BP (!) 144/90 (BP Location: Left Arm)   Pulse 94   Ht 5' (1.524 m)   Wt 204 lb (92.5 kg)   SpO2 96%   BMI 39.84 kg/m , BMI Body mass index is 39.84 kg/m.  Wt Readings from Last 3 Encounters:  02/14/17 204 lb (92.5 kg)  02/10/17 199 lb (90.3 kg)  02/08/17 199 lb (90.3 kg)    General: Short statured obese woman, no distress. HEENT: Conjunctiva and lids normal, oropharynx clear. Neck: Supple, no elevated JVP or  carotid bruits, no thyromegaly. Lungs: No active wheezing, coarse breath sounds. Cardiac: Regular rate and rhythm, no S3 or significant systolic murmur, no pericardial rub. Abdomen: Soft, nontender, bowel sounds present, no guarding or rebound. Extremities: Mild lower leg and ankle edema, distal pulses 2+. Skin: Warm and dry. Musculoskeletal: Kyphosis noted. Neuropsychiatric: Alert and oriented x3, affect grossly appropriate.  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  Recent Labwork: 01/21/2017: ALT 36; AST 29 02/08/2017: BUN 20; Creatinine, Ser 1.55; Hemoglobin 10.4; Platelets 324; Potassium 3.3; Sodium 136  October 2018: Hemoglobin 11.0, platelets 333, BUN 19, creatinine 1.45, potassium 4.8, AST 23, ALT 26, cholesterol 166, triglycerides 108, HDL 63, LDL 81, hemoglobin A1c 6.1   Other Studies Reviewed Today:  Echocardiogram 09/22/2015: Study Conclusions  - Left ventricle: The cavity size was normal. Wall thickness was   normal. Systolic function was normal. The estimated ejection   fraction was in the range of 55% to 60%. Doppler parameters are   consistent with abnormal left ventricular relaxation (grade 1   diastolic dysfunction).  Assessment and Plan:  1.  Intermittent palpitations and tachycardia.  ECG obtained during one of these episodes in Dr. [name] office showed sinus tachycardia.  She underwent cardiac monitoring back in 2014 without obvious arrhythmia.  Given reported progression of symptoms in the last few months we will obtain a follow-up 7-day event recorder.  She states that she has at least 3 or 4 episodes a week.  Otherwise no changes in medical therapy at this time.  2.  Essential hypertension, on Benicar.  Blood pressure elevated today although she did not take her medicine yet.  3.  History of asthma and pulmonary eosinophilia, followed by pulmonary.  4.  CKD stage III, recent  creatinine 1.45.  Current medicines were reviewed with the patient today.   Orders Placed This Encounter  Procedures  . Cardiac event monitor    Disposition: Call with test results.  Signed, Satira Sark, MD, [facility] 02/14/2017 1:38 PM    [place] [facility] [address], [place], [place] [address] Phone: [phone]; Fax: [phone]

## 2017-02-14 ENCOUNTER — Encounter: Payer: Self-pay | Admitting: Licensed Clinical Social Worker

## 2017-02-14 ENCOUNTER — Encounter: Payer: Self-pay | Admitting: Cardiology

## 2017-02-14 ENCOUNTER — Telehealth: Payer: Self-pay

## 2017-02-14 ENCOUNTER — Ambulatory Visit: Payer: PPO | Admitting: Cardiology

## 2017-02-14 ENCOUNTER — Other Ambulatory Visit: Payer: Self-pay | Admitting: Licensed Clinical Social Worker

## 2017-02-14 VITALS — BP 144/90 | HR 94 | Ht 60.0 in | Wt 204.0 lb

## 2017-02-14 DIAGNOSIS — Z8709 Personal history of other diseases of the respiratory system: Secondary | ICD-10-CM

## 2017-02-14 DIAGNOSIS — R002 Palpitations: Secondary | ICD-10-CM

## 2017-02-14 DIAGNOSIS — I10 Essential (primary) hypertension: Secondary | ICD-10-CM | POA: Diagnosis not present

## 2017-02-14 DIAGNOSIS — N183 Chronic kidney disease, stage 3 unspecified: Secondary | ICD-10-CM

## 2017-02-14 DIAGNOSIS — J82 Pulmonary eosinophilia, not elsewhere classified: Secondary | ICD-10-CM | POA: Diagnosis not present

## 2017-02-14 DIAGNOSIS — J8289 Other pulmonary eosinophilia, not elsewhere classified: Secondary | ICD-10-CM

## 2017-02-14 NOTE — Telephone Encounter (Signed)
Noted. Spoke with pt and she will stop by to pick up samples of Linzess.

## 2017-02-14 NOTE — Telephone Encounter (Signed)
Pt left a VM stating she is taking Linzess and is still having some constipation.  Returned pts call, she is currently taking Linzess 237mcg daily. Her bowel movements are formed but pt still has some straining. Pt has a bowel movement qd or if not qd qod. Pt was seen at the ED after her last visit 01/2018 and was asked to take MirLax. Do you want pt to start another stool softener while taking Linzess? Please advise

## 2017-02-14 NOTE — Telephone Encounter (Signed)
Continue Linzess 290 daily. Take MiraLAX 17 g orally every day at bedtime  Take Colace 100 mg twice daily.

## 2017-02-14 NOTE — Patient Outreach (Signed)
Assessment:  CSW received referral on Glenda Terry. CSW completed chart review on client on 02/14/17. Client is receiving [facility] nursing support with RN Glenda Terry. Client sees Dr. [name] Terry as primary care doctor. Client lives alone .  Client has [employer]. CSW called client on 02/14/17. CSW spoke via phone with client on 02/14/17. CSW verified client identity. CSW received verbal permission from client on 02/14/17 for CSW to speak with client about current client needs and status. CSW talked with client about [facility] program support in nursing, social work and pharmacy. CSW and client completed needed [facility] assessments for client. CSW spoke with client about client care plan. CSW encouraged client to communicate with CSW in next 30 days to discuss dental resources for client in the community. Client said she has appointment with Dr. [name] Terry on 02/23/17.  She has appointment with her pulmonary doctor on 02/25/17.  She has appointment with Dr. Henri Terry, gastroenterologist, on 02/22/17. She spoke of pain issues of client . She said she is taking a prescribed pain medication. CSW talked with client about [facility] in [place], [place] talked with client about [facility] in [place], [place]. [place] talked with client about dental needs of client. Client said she needed a set of dentures and was looking at dental clinics to see which clinic was more affordable for her.  CSW spoke with client  [place] support she is receiving from [place]. CSW thanked client for phone call with CSW on 02/14/17. Client was appreciative of phone call received from Glenda Terry on 02/14/17.  Plan:   Client to communicate with CSW in the next 30 days to discuss dental resources for client in the community .    CSW to collaborate with RN Glenda Terry in monitoring needs of client.  CSW to call client in 4 weeks to assess client needs at that time.  Glenda Terry.Glenda Terry MSW, LCSW Licensed Clinical  Social Worker [facility] Care Management [phone]

## 2017-02-14 NOTE — Patient Instructions (Signed)
Your physician recommends that you schedule a follow-up appointment in:  We will call you with results   Your physician has recommended that you wear an event monitor for 7 days. Event monitors are medical devices that record the heart's electrical activity. Doctors most often [place] these monitors to diagnose arrhythmias. Arrhythmias are problems with the speed or rhythm of the heartbeat. The monitor is a small, portable device. You can wear one while you do your normal daily activities. This is usually used to diagnose what is causing palpitations/syncope (passing out).    Your physician recommends that you continue on your current medications as directed. Please refer to the Current Medication list given to you today.     No lab work ordered today      Thank you for choosing [facility] !

## 2017-02-17 ENCOUNTER — Ambulatory Visit (INDEPENDENT_AMBULATORY_CARE_PROVIDER_SITE_OTHER): Payer: PPO

## 2017-02-17 DIAGNOSIS — R002 Palpitations: Secondary | ICD-10-CM | POA: Diagnosis not present

## 2017-02-21 ENCOUNTER — Ambulatory Visit (HOSPITAL_COMMUNITY): Payer: PPO

## 2017-02-22 ENCOUNTER — Ambulatory Visit: Payer: PPO | Admitting: Internal Medicine

## 2017-02-23 ENCOUNTER — Telehealth: Payer: Self-pay | Admitting: Cardiology

## 2017-02-23 DIAGNOSIS — R000 Tachycardia, unspecified: Secondary | ICD-10-CM | POA: Diagnosis not present

## 2017-02-23 DIAGNOSIS — K219 Gastro-esophageal reflux disease without esophagitis: Secondary | ICD-10-CM | POA: Diagnosis not present

## 2017-02-23 NOTE — Telephone Encounter (Signed)
Made pt aware to keep appointment with Dr. [name] office. She stated that if she continued to have more problems she will let us know.

## 2017-02-23 NOTE — Telephone Encounter (Signed)
Returned pt call. She stated that she has returned the event monitor to the company as it caused nothing but trouble since the first day she had it. She constantly had to call them because it kept having problems. She stated that her nerves could not take it any longer and it was not helpful to her to keep getting upset over. I apologized that it was not a good experience for her.  I will inform Dr. Domenic Polite that patient returned monitor.

## 2017-02-23 NOTE — Telephone Encounter (Signed)
Noted.  Please let Dr. [name] office know as well since they referred the patient for evaluation of palpitations.  If her symptoms worsen and she is amenable to wearing a cardiac monitor again, we can see her back for further assessment.  Otherwise would keep follow-up with Dr. [name] Crane.

## 2017-02-23 NOTE — Telephone Encounter (Signed)
Please call patient regarding event monitor / tg

## 2017-02-25 DIAGNOSIS — D721 Eosinophilia: Secondary | ICD-10-CM | POA: Diagnosis not present

## 2017-02-25 DIAGNOSIS — J4550 Severe persistent asthma, uncomplicated: Secondary | ICD-10-CM | POA: Diagnosis not present

## 2017-03-03 ENCOUNTER — Encounter: Payer: Self-pay | Admitting: *Deleted

## 2017-03-03 ENCOUNTER — Other Ambulatory Visit: Payer: Self-pay | Admitting: *Deleted

## 2017-03-03 ENCOUNTER — Ambulatory Visit (HOSPITAL_COMMUNITY): Payer: PPO

## 2017-03-03 NOTE — Patient Outreach (Signed)
[facility]) Care Management   03/03/2017  Glenda Terry 1940/04/23 [id number]  Glenda Terry is an 76 y.o. female  Subjective: Routine home visit with pt, HIPAA verified, pt reports she wore heart monitor for few days and sent it back "because it was difficult to deal with and made me so nervous"  Pt states "doctor says monitor showed nothing wrong"  Pt reports " sometimes my heart beats fast and I think it's stress and I take a xanax"  Pt denies any falls, is attending all appointments, now driving, reports taking all medications as prescribed, has followed up with cardiologist and primary care MD in December, reports "my back is healed and does feel somewhat better" Pt to see dietician March 31, 2016.  Objective:   Vitals:   03/03/17 1145  BP: 130/78  Pulse: 90  Resp: 18  SpO2: 95%   ROS  Physical Exam  Constitutional: She is oriented to person, place, and time. She appears well-developed and well-nourished.  HENT:  Head: Normocephalic.  Neck: Normal range of motion. Neck supple.  Cardiovascular: Normal rate and regular rhythm.  Respiratory: Effort normal.  bil breath sounds diminished all lobes  GI: Soft. Bowel sounds are normal.  Musculoskeletal: Normal range of motion. She exhibits no edema.  Neurological: She is alert and oriented to person, place, and time.  Skin: Skin is warm and dry.  Psychiatric: She has a normal mood and affect. Her behavior is normal. Judgment and thought content normal.    Encounter Medications:   Outpatient Encounter Medications as of 03/03/2017  Medication Sig Note  . albuterol (PROAIR HFA) 108 (90 BASE) MCG/ACT inhaler Inhale 2 puffs into the lungs every 6 (six) hours as needed for wheezing or shortness of breath. For COPD   . ALPRAZolam (XANAX) 0.5 MG tablet Take 0.25-0.5 mg by mouth 2 (two) times daily as needed for anxiety.    Marland Kitchen Benralizumab (FASENRA) 30 MG/ML SOSY Inject into the skin every 8 (eight) weeks.    . budesonide-formoterol (SYMBICORT) 160-4.5 MCG/ACT inhaler Inhale 2 puffs into the lungs 2 (two) times daily.   . cetirizine (ZYRTEC) 10 MG tablet Take 10 mg by mouth at bedtime.    Marland Kitchen EPINEPHrine (EPIPEN 2-PAK) 0.3 mg/0.3 mL DEVI Inject 0.3 mg into the muscle once.    Marland Kitchen esomeprazole (NEXIUM) 40 MG capsule Take 40 mg by mouth daily at 12 noon.   . ferrous sulfate 325 (65 FE) MG tablet Take 325 mg by mouth daily with breakfast.   . fluticasone (FLONASE) 50 MCG/ACT nasal spray Place 1 spray into both nostrils 2 (two) times daily. 02/10/2017: Taking once daily  . folic acid (FOLVITE) 1 MG tablet Take 1 mg by mouth daily.   Marland Kitchen HYDROcodone-acetaminophen (NORCO/VICODIN) 5-325 MG tablet Take 1-2 tablets every 6 (six) hours as needed by mouth. (Patient taking differently: Take 1-2 tablets by mouth every 6 (six) hours as needed for moderate pain. )   . ipratropium-albuterol (DUONEB) 0.5-2.5 (3) MG/3ML SOLN Take 3 mLs by nebulization 2 (two) times daily.    Marland Kitchen ketotifen (ZADITOR) 0.025 % ophthalmic solution Place 1 drop into both eyes 2 (two) times daily.   . Magnesium 250 MG TABS Take 1 tablet by mouth daily.   . montelukast (SINGULAIR) 10 MG tablet Take 10 mg by mouth daily.    Marland Kitchen olmesartan (BENICAR) 40 MG tablet Take 40 mg by mouth daily.   Vladimir Faster Glycol-Propyl Glycol (SYSTANE OP) Place 1 drop into both eyes 2 (  two) times daily.   . polyethylene glycol (MIRALAX) packet Take 17 g daily by mouth.   . potassium chloride (K-DUR) 10 MEQ tablet Take 10 mEq by mouth See admin instructions. Take 10 meq by mouth once daily for 7 days then off for 7 days, then restart   . predniSONE (DELTASONE) 5 MG tablet Take 5 mg by mouth daily with breakfast.    . torsemide (DEMADEX) 10 MG tablet Take 10 mg by mouth See admin instructions. Take 10 mg by mouth daily for 7 days then off for 7 days, then restart   . Turmeric 500 MG CAPS Take 1 capsule by mouth daily.    No facility-administered encounter medications on file  as of 03/03/2017.     Functional Status:   In your present state of health, do you have any difficulty performing the following activities: 02/14/2017 02/10/2017  Hearing? N N  Vision? N N  Difficulty concentrating or making decisions? N N  Walking or climbing stairs? Y Y  Dressing or bathing? Y Y  Doing errands, shopping? Tempie Donning  Preparing Food and eating ? N N  Using the Toilet? N N  In the past six months, have you accidently leaked urine? Y Y  Comment - stress incontinence- per pt  Do you have problems with loss of bowel control? N N  Managing your Medications? N N  Managing your Finances? N N  Housekeeping or managing your Housekeeping? Y Y  Some recent data might be hidden    Fall/Depression Screening:    Fall Risk  02/14/2017 02/10/2017 01/27/2017  Falls in the past year? Yes Yes Yes  Number falls in past yr: 2 or more 2 or more 2 or more  Injury with Fall? No No Yes  Risk Factor Category  High Fall Risk High Fall Risk High Fall Risk  Risk for fall due to : History of fall(s);Impaired balance/gait;Impaired mobility History of fall(s);Impaired balance/gait;Impaired mobility History of fall(s);Impaired balance/gait;Impaired mobility  Follow up Falls prevention discussed Education provided;Falls prevention discussed Education provided;Falls prevention discussed   PHQ 2/9 Scores 02/14/2017 02/10/2017 01/27/2017 12/29/2016 10/03/2015 04/21/2015 04/14/2015  PHQ - 2 Score 2 0 5 0 1 2 2   PHQ- 9 Score 5 - 7 - - 6 5    Assessment:    THN CM Care Plan Problem One     Most Recent Value  Care Plan Problem One  Decreased quality of life related to chronic pain  Role Documenting the Problem One  Care Management [place] for Problem One  Active  THN Long Term Goal   Pt will verbalize improved quality of life and decreased pain within 60 days  THN Long Term Goal Start Date  02/10/17  Interventions for Problem One Long Term Goal  reviewed with pt- she is now driving, back  puncture site is healed and back is feeling better, pt still not lifting anything heavier than gallon of milk  THN CM Short Term Goal #1   pt will verbalize strategies to maximize pain control within 30 days  THN CM Short Term Goal #1 Start Date  03/12/17  Interventions for Short Term Goal #1  RN CM reinforced taking pain medication as prescribed, reviewed relaxation and other alternatives such as deep breathing to assist with pain control. pt feels kyphoplasty has helped her and hopes "it will get even better"    [facility] CM Care Plan Problem Two     Most Recent Value  Care Plan Problem  Two  Pt high risk for falls  Role Documenting the Problem Two  Care Management [place] for Problem Two  Active  THN CM Short Term Goal #1   Pt will have no falls within 30 days  THN CM Short Term Goal #1 Start Date  02/10/17  [facility] CM Short Term Goal #1 Met Date   03/03/17  Interventions for Short Term Goal #2   RN CM reinforced with pt importance of using walker at all times, keep pathyways clear, ask for assistance as needed.     Plan: see pt for home visit next month Assess pain Follow up visit with dietician  Jacqlyn Larsen [facility], [place] Coordinator [phone]

## 2017-03-17 ENCOUNTER — Other Ambulatory Visit: Payer: Self-pay | Admitting: Licensed Clinical Social Worker

## 2017-03-17 NOTE — Patient Outreach (Signed)
Assessment:  CSW spoke via phone with client. CSW verified client identity. CSW received verbal permission from client on 03/17/17 for CSW to speak with client about current client needs and status. Client sees Dr. [name] Crane as primary care doctor. Client  lives alone. Client has [employer].  CSW talked with client about client care plan.  CSW encouraged client to communicate with CSW in next 30 days to discuss dental resources for client in the community.  Client said she is taking a prescribed pain medication.  CSW have spoken with client about [facility] in [place], [place] talked with client about [facility] in [place], [place].  Client said she had been checking with different dental clinics to check on the price of a set of dentures for client at those clinics.  Client said she had a medical appointment with her primary doctor in December of 2018. She said she had an appointment with her cardiologist  in December of 2018.  Client is receiving [facility] nursing support with RN Jacqlyn Larsen.  Client uses a walker to help her ambulate.  Client said she has appointment with dietician, Jearld Fenton, on 03/31/17.  Client also has an appointment with Dr. [name] Crane, her primary doctor, on 03/31/17. Client also said she had a back procedure completed on 02/08/17. Client said she sees orthopedic doctor in [place], [place] as needed.  CSW thanked client for phone call with CSW on 03/17/17.   CSW encouraged client to call CSW at [phone] as needed to discuss social work needs of client. Client was appreciative of phone call from [place] on 03/17/17.   Plan:  Client to communicate with CSW in next 30 days to discuss dental resources for client in the community.   CSW to collaborate with RN Jacqlyn Larsen as needed in monitoring needs of client.  CSW to call client in 4 weeks to assess client needs.  Norva Riffle.Shantell Belongia MSW, LCSW Licensed Clinical Social Worker [facility] Care  Management [phone]

## 2017-03-29 ENCOUNTER — Ambulatory Visit: Payer: Self-pay | Admitting: *Deleted

## 2017-03-29 DIAGNOSIS — D509 Iron deficiency anemia, unspecified: Secondary | ICD-10-CM | POA: Diagnosis not present

## 2017-03-29 DIAGNOSIS — I10 Essential (primary) hypertension: Secondary | ICD-10-CM | POA: Diagnosis not present

## 2017-03-29 DIAGNOSIS — N183 Chronic kidney disease, stage 3 (moderate): Secondary | ICD-10-CM | POA: Diagnosis not present

## 2017-03-29 DIAGNOSIS — R7301 Impaired fasting glucose: Secondary | ICD-10-CM | POA: Diagnosis not present

## 2017-03-31 ENCOUNTER — Encounter: Payer: Self-pay | Admitting: *Deleted

## 2017-03-31 ENCOUNTER — Encounter: Payer: PPO | Attending: Internal Medicine | Admitting: Nutrition

## 2017-03-31 ENCOUNTER — Other Ambulatory Visit: Payer: Self-pay | Admitting: *Deleted

## 2017-03-31 VITALS — Wt 202.0 lb

## 2017-03-31 DIAGNOSIS — J4520 Mild intermittent asthma, uncomplicated: Secondary | ICD-10-CM | POA: Diagnosis not present

## 2017-03-31 DIAGNOSIS — K219 Gastro-esophageal reflux disease without esophagitis: Secondary | ICD-10-CM | POA: Diagnosis not present

## 2017-03-31 DIAGNOSIS — E249 Cushing's syndrome, unspecified: Secondary | ICD-10-CM | POA: Diagnosis not present

## 2017-03-31 DIAGNOSIS — I129 Hypertensive chronic kidney disease with stage 1 through stage 4 chronic kidney disease, or unspecified chronic kidney disease: Secondary | ICD-10-CM

## 2017-03-31 DIAGNOSIS — Z713 Dietary counseling and surveillance: Secondary | ICD-10-CM | POA: Diagnosis not present

## 2017-03-31 DIAGNOSIS — E669 Obesity, unspecified: Secondary | ICD-10-CM

## 2017-03-31 DIAGNOSIS — E6609 Other obesity due to excess calories: Secondary | ICD-10-CM | POA: Diagnosis not present

## 2017-03-31 DIAGNOSIS — I10 Essential (primary) hypertension: Secondary | ICD-10-CM | POA: Diagnosis not present

## 2017-03-31 DIAGNOSIS — R7303 Prediabetes: Secondary | ICD-10-CM | POA: Diagnosis not present

## 2017-03-31 DIAGNOSIS — R7301 Impaired fasting glucose: Secondary | ICD-10-CM | POA: Diagnosis not present

## 2017-03-31 DIAGNOSIS — R600 Localized edema: Secondary | ICD-10-CM | POA: Diagnosis not present

## 2017-03-31 DIAGNOSIS — J4542 Moderate persistent asthma with status asthmaticus: Secondary | ICD-10-CM | POA: Diagnosis not present

## 2017-03-31 DIAGNOSIS — N183 Chronic kidney disease, stage 3 (moderate): Secondary | ICD-10-CM | POA: Diagnosis not present

## 2017-03-31 DIAGNOSIS — M4800 Spinal stenosis, site unspecified: Secondary | ICD-10-CM | POA: Diagnosis not present

## 2017-03-31 DIAGNOSIS — D509 Iron deficiency anemia, unspecified: Secondary | ICD-10-CM | POA: Diagnosis not present

## 2017-03-31 DIAGNOSIS — J441 Chronic obstructive pulmonary disease with (acute) exacerbation: Secondary | ICD-10-CM | POA: Diagnosis not present

## 2017-03-31 NOTE — Progress Notes (Signed)
  Medical Nutrition Therapy:  Appt start time: 1500 end time:  4818.   Assessment:  Primary concerns today: Follow up :  Overweight, CKD Stg 4, HTN. Has been avoiding salt and using onion powerd, tumeric, celery. Still cooks for herself. Creat 1.5 mg/dl and e GFR 32. She is taking her fluid pills for 7 days and then off it for 7 days per her cardiologist. Sounds to be congested today and SOB..  To go see DR. [name] Crane today after her appt with me.Marland Kitchen Has been eating better and cutting out snacks, salty foods and processed foods.  She notes she over indulged over the holidays but is back on track since January . She is now working on puzzles.  Fell in Sept and damaged her thoracic spine and surgery.   Preferred Learning Style:   Auditory  Visual  Hands on  Learning Readiness:   Ready  Change in progress   MEDICATIONS: See list   DIETARY INTAKE:  24-hr recall:  B ( AM):1/2 c oatemeal, appleause, 1 egg and coffee Snk ( AM):   L ( PM): Black eyed peas, greens, and peaches and water.  Snk ( PM):  D ( PM):  Same as lunch Snk ( PM): Beverages: Water, sometimes soda.  Usual physical activity: ADL  Estimated energy needs: 1500  calories 170 g carbohydrates 112 g protein 42 g fat  Progress Towards Goal(s):  In progress.   Nutritional Diagnosis:  NB-1.1 Food and nutrition-related knowledge deficit As related to Diabetes.  As evidenced by A1C > 6.5%.    Intervention:  Nutrition and Pre-Diabetes education provided on a Low Salt Low Fat Diet using My Plate, CHO counting, meal planning, portion sizes, timing of meals, avoiding snacks between meals  taking medications as prescribed, benefits of exercising 30 minutes per day. Reviewed avoiding processed, high salt foods and cutting out snacks.   Goals 1. Exercise walk 10 minutes a day. 2. Measure foods and watch portions. 3. Lose 2 lbs per month. 4. Increase fresh fruits and vegetables.  Teaching Method Utilized:   Visual Auditory Hands on  Handouts given during visit include:  Low Salt Diet  My Plate Method    Barriers to learning/adherence to lifestyle change: none  Demonstrated degree of understanding via:  Teach Back   Monitoring/Evaluation:  Dietary intake, exercise, , and body weight in 3 month(s).

## 2017-03-31 NOTE — Patient Outreach (Addendum)
[place] [facility]) Care Management   03/31/2017  Glenda Terry 13-Jan-1941 [id number]  Glenda Terry is an 77 y.o. female  Subjective: Routine home visit with pt, HIPAA verified, pt reports " I've been feeling sick since Saturday, I think I have sinus infection, I have a headache and I may have a UTI"  Pt reports she is going to see primary MD today at 430 and will see dietician at 330.  Pt reports her prescription for 38m prednisone ran out in December and pt did not get refilled and is now cutting a 159mtablet into quarters and taking 2.54m854maily because " I want to get off of this and I will tell my doctor today all about it" Pt states " my back pain is gone, I feel so much better and have no pain"  Objective:   Vitals:   03/31/17 1320 03/31/17 1327  BP:  138/78  Pulse:  (!) 108  Resp: 20 18  SpO2:  98%   ROS  Physical Exam  Constitutional: She appears well-developed and well-nourished.  HENT:  Head: Normocephalic.  Neck: Normal range of motion. Neck supple.  Cardiovascular: Normal rate and regular rhythm.  Respiratory:  Dyspnea with exertion Expiratory wheezing auscultated posterior lower lobes bil  GI: Soft. Bowel sounds are normal.  Musculoskeletal: Normal range of motion. She exhibits edema.  1+ edema to right lower leg No edema in feet Ankles are large (chronic issue per pt)  Neurological: She is alert.  Skin: Skin is warm and dry.  Psychiatric: She has a normal mood and affect. Her behavior is normal. Judgment and thought content normal.    Encounter Medications:   Outpatient Encounter Medications as of 03/31/2017  Medication Sig Note  . albuterol (PROAIR HFA) 108 (90 BASE) MCG/ACT inhaler Inhale 2 puffs into the lungs every 6 (six) hours as needed for wheezing or shortness of breath. For COPD   . ALPRAZolam (XANAX) 0.5 MG tablet Take 0.25-0.5 mg by mouth 2 (two) times daily as needed for anxiety.    . BMarland Kitchennralizumab (FASENRA) 30 MG/ML SOSY  Inject into the skin every 8 (eight) weeks.   . budesonide-formoterol (SYMBICORT) 160-4.5 MCG/ACT inhaler Inhale 2 puffs into the lungs 2 (two) times daily.   . cetirizine (ZYRTEC) 10 MG tablet Take 10 mg by mouth at bedtime.    . EMarland KitchenINEPHrine (EPIPEN 2-PAK) 0.3 mg/0.3 mL DEVI Inject 0.3 mg into the muscle once.    . eMarland Kitchenomeprazole (NEXIUM) 40 MG capsule Take 40 mg by mouth daily at 12 noon.   . ferrous sulfate 325 (65 FE) MG tablet Take 325 mg by mouth daily with breakfast.   . fluticasone (FLONASE) 50 MCG/ACT nasal spray Place 1 spray into both nostrils 2 (two) times daily. 02/10/2017: Taking once daily  . folic acid (FOLVITE) 1 MG tablet Take 1 mg by mouth daily.   . iMarland Kitchenratropium-albuterol (DUONEB) 0.5-2.5 (3) MG/3ML SOLN Take 3 mLs by nebulization 2 (two) times daily.    . kMarland Kitchentotifen (ZADITOR) 0.025 % ophthalmic solution Place 1 drop into both eyes 2 (two) times daily.   . Magnesium 250 MG TABS Take 1 tablet by mouth daily.   . montelukast (SINGULAIR) 10 MG tablet Take 10 mg by mouth daily.    . oMarland Kitchenmesartan (BENICAR) 40 MG tablet Take 40 mg by mouth daily.   . PVladimir Fasterycol-Propyl Glycol (SYSTANE OP) Place 1 drop into both eyes 2 (two) times daily.   . polyethylene glycol (MIRALAX) packet Take 17 g  daily by mouth.   . potassium chloride (K-DUR) 10 MEQ tablet Take 10 mEq by mouth See admin instructions. Take 10 meq by mouth once daily for 7 days then off for 7 days, then restart   . torsemide (DEMADEX) 10 MG tablet Take 10 mg by mouth See admin instructions. Take 10 mg by mouth daily for 7 days then off for 7 days, then restart   . Turmeric 500 MG CAPS Take 1 capsule by mouth daily.   Marland Kitchen HYDROcodone-acetaminophen (NORCO/VICODIN) 5-325 MG tablet Take 1-2 tablets every 6 (six) hours as needed by mouth. (Patient not taking: Reported on 03/31/2017)   . predniSONE (DELTASONE) 5 MG tablet Take 5 mg by mouth daily with breakfast.  03/31/2017: Pt is taking 2.5 mg daily (cutting 70m tablet into quarters)    No facility-administered encounter medications on file as of 03/31/2017.     Functional Status:   In your present state of health, do you have any difficulty performing the following activities: 02/14/2017 02/10/2017  Hearing? N N  Vision? N N  Difficulty concentrating or making decisions? N N  Walking or climbing stairs? Y Y  Dressing or bathing? Y Y  Doing errands, shopping? YTempie Donning Preparing Food and eating ? N N  Using the Toilet? N N  In the past six months, have you accidently leaked urine? Y Y  Comment - stress incontinence- per pt  Do you have problems with loss of bowel control? N N  Managing your Medications? N N  Managing your Finances? N N  Housekeeping or managing your Housekeeping? Y Y  Some recent data might be hidden    Fall/Depression Screening:    Fall Risk  03/31/2017 02/14/2017 02/10/2017  Falls in the past year? Yes Yes Yes  Number falls in past yr: 2 or more 2 or more 2 or more  Injury with Fall? No No No  Risk Factor Category  High Fall Risk High Fall Risk High Fall Risk  Risk for fall due to : History of fall(s);Impaired balance/gait;Medication side effect History of fall(s);Impaired balance/gait;Impaired mobility History of fall(s);Impaired balance/gait;Impaired mobility  Follow up Falls evaluation completed;Falls prevention discussed Falls prevention discussed Education provided;Falls prevention discussed   PHQ 2/9 Scores 02/14/2017 02/10/2017 01/27/2017 12/29/2016 10/03/2015 04/21/2015 04/14/2015  PHQ - 2 Score 2 0 5 0 _0 PHQ- 9 Score 5 - 7 - - 6 5    Assessment:  RN CM reviewed medications with pt.  Pt is using walker and will be attending appointments today and driving herself.  RN CM faxed update to Dr. [place] Crane[place] reported heart rate of 108, reported wheezing noted and pt is taking decreased dosage of prednisone. RN CM reviewed signs/ symptoms infection, importance of calling MD early for change in health status. RN CM reviewed discharge plan with pt and  will continue seeing pt for home visits.  [facility] needs,  RN CM sent in basket to [facility] this request.  [facility]     Most Recent Value  Care Plan Problem One  Decreased quality of life related to chronic pain  Role Documenting the Problem One  [place]  Active  THN Long Term Goal   Pt will verbalize improved quality of life and decreased pain within 60 days  THN Long Term Goal Start Date  02/10/17  Interventions  for Problem One Long Term Goal  Pt reports no back pain or any other pain and this is ongoing goal for pt to remain pain free  THN CM Short Term Goal #1   pt will verbalize strategies to maximize pain control within 30 days  THN CM Short Term Goal #1 Start Date  03/12/17  [facility] CM Short Term Goal #1 Met Date  03/31/17  Interventions for Short Term Goal #1  RN CM reviewed relaxation techniques and importance of protecting back from injury (no heavy lifting, keeping abdominal muscles strong)    THN CM Care Plan Problem Two     Most Recent Value  Care Plan Problem Two  Pt high risk for falls  Role Documenting the Problem Two  Care Management Coordinator  Care Plan for Problem Two  Active  THN CM Short Term Goal #1   Pt will have no falls within 30 days  THN CM Short Term Goal #1 Start Date  02/10/17  [facility] CM Short Term Goal #1 Met Date   03/03/17    [facility] CM Care Plan Problem Three     Most Recent Value  Care Plan Problem Three  Pt high risk for respiratory infection  Role Documenting the Problem Three  Care Management Coordinator  Care Plan for Problem Three  Active  THN CM Short Term Goal #1   Pt will see primary MD today and will adhere to prescribed treatment for any diagnosed respiratory issues within 30 days  THN CM Short Term Goal #1 Start Date  03/31/17  Interventions for Short Term Goal #1  RN CM faxed primary care MD Dr. [name] Crane and  reported pt has expiratory wheezing lower lobes bil and pt feels that she has sinus infection, reported HR of 108 and pt is taking decreased dosage of prednisone.     Plan: follow up with home visit next month  Jacqlyn Larsen [facility], [place] Coordinator [phone]

## 2017-03-31 NOTE — Patient Instructions (Signed)
Goals 1. Exercise walk 10 minutes a day. 2. Measure foods and watch portions. 3. Lose 2 lbs per month. 4. Increase fresh fruits and vegetables.

## 2017-04-06 ENCOUNTER — Other Ambulatory Visit (HOSPITAL_COMMUNITY): Payer: Self-pay | Admitting: Internal Medicine

## 2017-04-06 ENCOUNTER — Encounter: Payer: Self-pay | Admitting: Nutrition

## 2017-04-06 DIAGNOSIS — Z1231 Encounter for screening mammogram for malignant neoplasm of breast: Secondary | ICD-10-CM

## 2017-04-15 ENCOUNTER — Ambulatory Visit: Payer: PPO | Admitting: Internal Medicine

## 2017-04-15 VITALS — BP 144/83 | HR 108 | Temp 97.8°F | Ht 60.0 in | Wt 197.6 lb

## 2017-04-15 DIAGNOSIS — K219 Gastro-esophageal reflux disease without esophagitis: Secondary | ICD-10-CM

## 2017-04-15 DIAGNOSIS — Z86010 Personal history of colonic polyps: Secondary | ICD-10-CM

## 2017-04-15 NOTE — Progress Notes (Signed)
Primary Care Physician:  Celene Squibb, MD Primary Gastroenterologist:  Dr. Gala Romney  Pre-Procedure History & Physical: HPI:  Glenda Terry is a 77 y.o. female here for follow-up of constipation and history of colonic adenoma. Reflux symptoms well controlled on Nexium 40 mg twice daily. No dysphagia. She had surgical procedure for spinal stenosis which helped considerably and is at least temporally associated with marked improvement in her constipation symptoms. Positive family history of colon cancer in 2 sisters. She is due for surveillance colonoscopy later this year.  Having significant difficulties with her left shoulder. She seeing orthopod next week for surgical repair/replacement.  Patient also complains of intermittent protruding hemorrhoids.  Past Medical History:  Diagnosis Date  . Anxiety   . Back pain, chronic   . CKD (chronic kidney disease) stage 3, GFR 30-59 ml/min (HCC)   . 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 Essential hypertension   . GERD (gastroesophageal reflux disease)   . HOH (hard of hearing)   . Inflammatory arthritis   . Iron deficiency anemia 01/2012  . 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 Spinal stenosis   . Tubular adenoma 11/2012    Past Surgical History:  Procedure Laterality Date  . ABDOMINAL HYSTERECTOMY    . BACK SURGERY    . CARPAL TUNNEL RELEASE Right 4/14  . CATARACT EXTRACTION W/PHACO Left 11/19/2014   Procedure: CATARACT EXTRACTION PHACO AND INTRAOCULAR LENS PLACEMENT (IOC);  Surgeon: Williams Che, MD;  Location: [facility];  Service: Ophthalmology;  Laterality: Left;  CDE: 4.77  . CATARACT EXTRACTION W/PHACO Right 02/03/2015   Procedure: CATARACT EXTRACTION PHACO AND INTRAOCULAR LENS PLACEMENT ([place]);  Surgeon: Williams Che, MD;  Location: [facility];  Service: Ophthalmology;  Laterality: Right;  CDE: 4.54  . COLONOSCOPY  06/19/2008   RMR: tortuous and elongated colon with scattered left-sided diverticula/colonic mucosa appeared entirely normal.  Prior colonic ulcers had resolved.  . COLONOSCOPY  12/2007   Dr. Gala Romney: Scattered diffuse sigmoid diverticula, 2 areas of ulceration at the hepatic flexure. Biopsies unremarkable.  . COLONOSCOPY N/A 11/20/2012   next TCS 11/2017  . DECOMPRESSIVE LUMBAR LAMINECTOMY LEVEL 2  04/20/2012   Procedure: DECOMPRESSIVE LUMBAR LAMINECTOMY LEVEL 2;  Surgeon: Tobi Bastos, MD;  Location: [facility];  Service: Orthopedics;  Laterality: Right;  Decompressive Lumbar Laminectomy of the L4 - L5 and L5 - S1 Complete/Laminectomy L5 on the Right (X-Ray)  . ESOPHAGOGASTRODUODENOSCOPY  12/2007   Dr. 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 Small hiatal hernia. Slightly pale duodenal mucosa (biopsy unremarkable)  . ESOPHAGOGASTRODUODENOSCOPY (EGD) WITH PROPOFOL N/A 01/20/2017   Procedure: ESOPHAGOGASTRODUODENOSCOPY (EGD) WITH PROPOFOL;  Surgeon: Daneil Dolin, MD;  Location: [facility];  Service: Endoscopy;  Laterality: N/A;  12:30pm  . IR KYPHO THORACIC WITH BONE BIOPSY  02/08/2017  . IR RADIOLOGIST EVAL & MGMT  02/02/2017  . KNEE ARTHROSCOPY Right   . MALONEY DILATION N/A 01/20/2017   Procedure: Venia Minks DILATION;  Surgeon: Daneil Dolin, MD;  [facility];  Service: Endoscopy;  Laterality: N/A;  . REVERSE SHOULDER ARTHROPLASTY Right 05/24/2014   Procedure: RIGHT SHOULDER REVERSE ARTHROPLASTY;  Surgeon: Netta Cedars, MD;  Location: [facility];  Service: Orthopedics;  Laterality: Right;  . TOTAL KNEE ARTHROPLASTY  01/24/2012   Procedure: TOTAL KNEE ARTHROPLASTY;  Surgeon: Gearlean Alf, MD;  Location: [facility];  Service: Orthopedics;  Laterality: Right;  . TUBAL LIGATION      Prior to Admission medications  Medication Sig Start Date End Date Taking? Authorizing Provider  albuterol (PROAIR HFA) 108 (90 BASE) MCG/ACT inhaler Inhale 2 puffs into the lungs every 6 (six) hours as needed for wheezing or shortness of breath. For COPD   Yes [provider]    ALPRAZolam (XANAX) 0.5 MG tablet Take 0.25-0.5 mg by mouth 2 (two) times daily as needed for anxiety.    Yes [provider]  Benralizumab (FASENRA) 30 MG/ML SOSY Inject into the skin every 8 (eight) weeks.   Yes [provider]  budesonide-formoterol (SYMBICORT) 160-4.5 MCG/ACT inhaler Inhale 2 puffs into the lungs 2 (two) times daily.   Yes [provider]  EPINEPHrine (EPIPEN 2-PAK) 0.3 mg/0.3 mL DEVI Inject 0.3 mg into the muscle once.    Yes [provider]  esomeprazole (NEXIUM) 40 MG capsule Take 40 mg by mouth daily at 12 noon.   Yes [provider]  ferrous sulfate 325 (65 FE) MG tablet Take 325 mg by mouth daily with breakfast.   Yes [provider]  fexofenadine (ALLEGRA) 60 MG tablet Take 60 mg by mouth daily.   Yes [provider]  fluticasone (FLONASE) 50 MCG/ACT nasal spray Place 1 spray into both nostrils 2 (two) times daily.   Yes [provider]  folic acid (FOLVITE) 1 MG tablet Take 1 mg by mouth daily.   Yes [provider]  ipratropium-albuterol (DUONEB) 0.5-2.5 (3) MG/3ML SOLN Take 3 mLs by nebulization 2 (two) times daily.    Yes [provider]  ketotifen (ZADITOR) 0.025 % ophthalmic solution Place 1 drop into both eyes 2 (two) times daily.   Yes [provider]  Magnesium 250 MG TABS Take 1 tablet by mouth daily.   Yes [provider]  montelukast (SINGULAIR) 10 MG tablet Take 10 mg by mouth daily.  04/12/15  Yes [provider]  olmesartan (BENICAR) 40 MG tablet Take 40 mg by mouth daily.   Yes [provider]  Polyethyl Glycol-Propyl Glycol (SYSTANE OP) Place 1 drop into both eyes 2 (two) times daily.   Yes [provider]  polyethylene glycol (MIRALAX) packet Take 17 g daily by mouth. Patient taking differently: Take 17 g by mouth daily as needed.  01/21/17  Yes Fredia Sorrow, MD  potassium chloride (K-DUR) 10 MEQ tablet Take 10 mEq by  mouth See admin instructions. Take 10 meq by mouth once daily for 7 days then off for 7 days, then restart 11/16/16  Yes [provider]  predniSONE (DELTASONE) 5 MG tablet Take 2.5 mg by mouth daily with breakfast.  01/16/16  Yes [provider]  torsemide (DEMADEX) 10 MG tablet Take 10 mg by mouth See admin instructions. Take 10 mg by mouth daily for 7 days then off for 7 days, then restart 11/12/16  Yes [provider]  Turmeric 500 MG CAPS Take 1 capsule by mouth daily.   Yes [provider]  cetirizine (ZYRTEC) 10 MG tablet Take 10 mg by mouth at bedtime.     [provider]  HYDROcodone-acetaminophen (NORCO/VICODIN) 5-325 MG tablet Take 1-2 tablets every 6 (six) hours as needed by mouth. Patient not taking: Reported on 03/31/2017 01/21/17   Fredia Sorrow, MD    Allergies as of 04/15/2017 - Review Complete 04/15/2017  Allergen Reaction Noted  . Flexeril [cyclobenzaprine] Anaphylaxis 01/12/2012  . Other Anaphylaxis and Other (See Comments) 02/08/2011  . Keflex [cephalexin] Itching 05/26/2012  . Aspirin Other (See Comments) 05/05/2015  . Statins Other (See  Comments) 05/17/2011  . Adhesive [tape] Rash 11/01/2014  . Chlorhexidine Rash 11/19/2014    Family History  Problem Relation Age of Onset  . Breast cancer Sister   . Colon cancer Sister        two deceased, one living and terminal, one current undergoing treatment, ages 72, 56, 32, 31  . Hypertension Mother   . Stroke Mother   . Heart attack Mother   . Hypertension Father   . Prostate cancer Father     Social History   Socioeconomic History  . Marital status: Single    Spouse name: Not on file  . Number of children: 3  . Years of education: Not on file  . Highest education level: Not on file  Social Needs  . Financial resource strain: Not on file  . Food insecurity - worry: Not on file  . Food insecurity - inability: Not on file  . Transportation needs - medical: Not on file    . Transportation needs - non-medical: Not on file  Occupational History  . Not on file  Tobacco Use  . Smoking status: Former Smoker    Packs/day: 2.00    Years: 40.00    Pack years: 80.00    Types: Cigarettes    Last attempt to quit: 01/19/1992    Years since quitting: 25.2  . Smokeless tobacco: Never Used  Substance and Sexual Activity  . Alcohol use: No  . Drug use: No  . Sexual activity: No  Other Topics Concern  . Not on file  Social History Narrative  . Not on file    Review of Systems: See HPI, otherwise negative ROS  Physical Exam: BP (!) 144/83   Pulse (!) 108   Temp 97.8 F (36.6 C) (Oral)   Ht 5' (1.524 m)   Wt 197 lb 9.6 oz (89.6 kg)   BMI 38.59 kg/m  General:   Alert,   pleasant and cooperative in NAD Neck:  Supple; no masses or thyromegaly. No significant cervical adenopathy. Lungs:  Clear throughout to auscultation.   No wheezes, crackles, or rhonchi. No acute distress. Heart:  Regular rate and rhythm; no murmurs, clicks, rubs,  or gallops. Abdomen: Non-distended, normal bowel sounds.  Soft and nontender without appreciable mass or hepatosplenomegaly.  Pulses:  Normal pulses noted. Extremities:  Without clubbing or edema.  Impression:  GERD symptoms well controlled on twice a day exium. Hopefully, we can decrease the dose.  No further dysphagia status post dilation of her esophagus.  Constipation not much of an issue now after having spinal stenosis treated.  Personal history of of  colonic adenoma and significant positive family history colon cancer in multiple first-degree relatives.  Recommendations:  Anusol HC cream - dispense 1 unit ; apply to the anorectum 3 x daily  3 refills  Office visit in 3 months after left shoulder surgery  Will need a colonoscopy later this year  Pamphlet on hemorrhoid banding provided  Decrease Nexium to 40 mg daily        Notice: This dictation was prepared with Dragon dictation along with smaller  phrase technology. Any transcriptional errors that result from this process are unintentional and may not be corrected upon review.

## 2017-04-15 NOTE — Patient Instructions (Addendum)
Anusol HC cream - dispense 1 unit ; apply to the anorectum 3 x daily  3 refills  Office visit in 3 months after left shoulder surgery  Will need a colonoscopy later this year  Pamphlet on hemorrhoid banding provided  Decrease Nexium to 40 mg daily

## 2017-04-18 ENCOUNTER — Telehealth: Payer: Self-pay | Admitting: Internal Medicine

## 2017-04-18 ENCOUNTER — Other Ambulatory Visit: Payer: Self-pay

## 2017-04-18 MED ORDER — HYDROCORTISONE 2.5 % RE CREA: 1.0000 "application " | TOPICAL_CREAM | Freq: Three times a day (TID) | RECTAL | 3 refills | Status: DC

## 2017-04-18 NOTE — Telephone Encounter (Signed)
Lmom, medication has been sent.

## 2017-04-18 NOTE — Telephone Encounter (Signed)
Pt called this morning. She was here on Friday. She said her prescription hasn't been called into [facility] yet.

## 2017-04-19 ENCOUNTER — Encounter: Payer: Self-pay | Admitting: Licensed Clinical Social Worker

## 2017-04-19 ENCOUNTER — Other Ambulatory Visit: Payer: Self-pay | Admitting: Licensed Clinical Social Worker

## 2017-04-19 NOTE — Patient Outreach (Signed)
Assessment:  CSW communicated with RN Jacqlyn Larsen regarding client status and needs. RN Jacqlyn Larsen informed CSW that client had told RN that client had no further CSW needs at present; client had told RN that client did not need further CSW contact since client had no further CSW needs at this time. Based on this information CSW received from RN Jacqlyn Larsen, CSW is discharging Glenda Terry. Monter from [facility] services on 04/19/17 since client has met her care plan goals with CSW services and client has said she has no further CSW needs at this time. Client will continue to receive [facility] nursing support with RN Jacqlyn Larsen.      Plan:  CSW is discharging [place]. [place] from [facility] services on 04/19/17 since client has met her care plan goals with CSW services and client has said she has no further CSW needs at this time.   CSW to inform Alycia Rossetti, Case Management Assistant, that [facility] discharged client on 04/19/17 from [employer] services only.  CSW to fax letter to Dr. [name] Crane informing Dr. [name] Crane that [facility] discharged client on 04/19/17 from [facility] services only.  Norva Riffle.Sueo Cullen MSW, LCSW Licensed Clinical Social Worker [facility] Care Management [phone]

## 2017-04-20 DIAGNOSIS — M19012 Primary osteoarthritis, left shoulder: Secondary | ICD-10-CM | POA: Diagnosis not present

## 2017-04-20 DIAGNOSIS — M25812 Other specified joint disorders, left shoulder: Secondary | ICD-10-CM | POA: Diagnosis not present

## 2017-04-20 DIAGNOSIS — M25512 Pain in left shoulder: Secondary | ICD-10-CM | POA: Diagnosis not present

## 2017-04-21 DIAGNOSIS — D721 Eosinophilia: Secondary | ICD-10-CM | POA: Diagnosis not present

## 2017-04-21 DIAGNOSIS — Z79899 Other long term (current) drug therapy: Secondary | ICD-10-CM | POA: Diagnosis not present

## 2017-04-21 DIAGNOSIS — J449 Chronic obstructive pulmonary disease, unspecified: Secondary | ICD-10-CM | POA: Diagnosis not present

## 2017-04-21 DIAGNOSIS — Z87891 Personal history of nicotine dependence: Secondary | ICD-10-CM | POA: Diagnosis not present

## 2017-04-21 DIAGNOSIS — J4550 Severe persistent asthma, uncomplicated: Secondary | ICD-10-CM | POA: Diagnosis not present

## 2017-04-27 ENCOUNTER — Encounter: Payer: Self-pay | Admitting: *Deleted

## 2017-04-27 ENCOUNTER — Other Ambulatory Visit: Payer: Self-pay | Admitting: *Deleted

## 2017-04-27 NOTE — Patient Outreach (Signed)
Glenda Terry   04/27/2017  Glenda Terry 06/24/1940 [id number]  Glenda Terry is an 77 y.o. female  Subjective: Routine home visit with pt, HIPAA verified, pt states she saw primary MD 03/31/17 and MD felt pt had allergy flareup, zyrtec discontinued and new allegra, on prednisone 2.5mg  daily, pt reports she continues seeing dietician, reports having issues with left shoulder rotator cuff for years "and recently has flared up again and I'm going to have surgery"  Pt states she is in process with doctor so pt hopefully gets clearance and can get surgery scheduled. Pt saw Dr. Gala Romney (GI) on 04/15/17 for follow up on constipation.  Objective:   Vitals:   04/27/17 1239  BP: 130/68  Pulse: 87  Resp: 18  SpO2: 97%   ROS  Physical Exam  Constitutional: She is oriented to person, place, and time. She appears well-developed and well-nourished.  HENT:  Head: Normocephalic.  Neck: Normal range of motion. Neck supple.  Respiratory: Effort normal.  GI: Soft. Bowel sounds are normal.  Musculoskeletal:  Pt has decreased range of motion to left shoulder  Neurological: She is alert and oriented to person, place, and time.  Skin: Skin is warm and dry.  Psychiatric: She has a normal mood and affect. Her behavior is normal. Judgment and thought content normal.    Encounter Medications:   Outpatient Encounter Medications as of 04/27/2017  Medication Sig Note  . albuterol (PROAIR HFA) 108 (90 BASE) MCG/ACT inhaler Inhale 2 puffs into the lungs every 6 (six) hours as needed for wheezing or shortness of breath. For COPD   . ALPRAZolam (XANAX) 0.5 MG tablet Take 0.25-0.5 mg by mouth 2 (two) times daily as needed for anxiety.    Marland Kitchen Benralizumab (FASENRA) 30 MG/ML SOSY Inject into the skin every 8 (eight) weeks.   . budesonide-formoterol (SYMBICORT) 160-4.5 MCG/ACT inhaler Inhale 2 puffs into the lungs 2 (two) times daily.   Marland Kitchen EPINEPHrine (EPIPEN 2-PAK) 0.3 mg/0.3  mL DEVI Inject 0.3 mg into the muscle once.    Marland Kitchen esomeprazole (NEXIUM) 40 MG capsule Take 40 mg by mouth daily at 12 noon.   . ferrous sulfate 325 (65 FE) MG tablet Take 325 mg by mouth daily with breakfast.   . fexofenadine (ALLEGRA) 60 MG tablet Take 60 mg by mouth daily.   . fluticasone (FLONASE) 50 MCG/ACT nasal spray Place 1 spray into both nostrils 2 (two) times daily. 02/10/2017: Taking once daily  . folic acid (FOLVITE) 1 MG tablet Take 1 mg by mouth daily.   . hydrocortisone (ANUSOL-HC) 2.5 % rectal cream Place 1 application rectally 3 (three) times daily.   Marland Kitchen ipratropium-albuterol (DUONEB) 0.5-2.5 (3) MG/3ML SOLN Take 3 mLs by nebulization 2 (two) times daily.    Marland Kitchen ketotifen (ZADITOR) 0.025 % ophthalmic solution Place 1 drop into both eyes 2 (two) times daily.   . Magnesium 250 MG TABS Take 1 tablet by mouth daily.   . montelukast (SINGULAIR) 10 MG tablet Take 10 mg by mouth daily.    Marland Kitchen olmesartan (BENICAR) 40 MG tablet Take 40 mg by mouth daily.   Vladimir Faster Glycol-Propyl Glycol (SYSTANE OP) Place 1 drop into both eyes 2 (two) times daily.   . polyethylene glycol (MIRALAX) packet Take 17 g daily by mouth. (Patient taking differently: Take 17 g by mouth daily as needed. )   . potassium chloride (K-DUR) 10 MEQ tablet Take 10 mEq by mouth See admin instructions. Take 10 meq by mouth  once daily for 7 days then off for 7 days, then restart   . predniSONE (DELTASONE) 5 MG tablet Take 2.5 mg by mouth daily with breakfast.  03/31/2017: Pt is taking 2.5 mg daily (cutting 10mg  tablet into quarters)  . torsemide (DEMADEX) 10 MG tablet Take 10 mg by mouth See admin instructions. Take 10 mg by mouth daily for 7 days then off for 7 days, then restart   . Turmeric 500 MG CAPS Take 1 capsule by mouth daily.   . cetirizine (ZYRTEC) 10 MG tablet Take 10 mg by mouth at bedtime.    Marland Kitchen HYDROcodone-acetaminophen (NORCO/VICODIN) 5-325 MG tablet Take 1-2 tablets every 6 (six) hours as needed by mouth. (Patient  not taking: Reported on 03/31/2017)    No facility-administered encounter medications on file as of 04/27/2017.     Functional Status:   In your present state of health, do you have any difficulty performing the following activities: 02/14/2017 02/10/2017  Hearing? N N  Vision? N N  Difficulty concentrating or making decisions? N N  Walking or climbing stairs? Y Y  Dressing or bathing? Y Y  Doing errands, shopping? [place]pie Donning  Preparing Food and eating ? N N  Using the Toilet? N N  In the past six months, have you accidently leaked urine? Y Y  Comment - stress incontinence- per pt  Do you have problems with loss of bowel control? N N  Managing your Medications? N N  Managing your Finances? N N  Housekeeping or managing your Housekeeping? Y Y  Some recent data might be hidden    Fall/Depression Screening:    Fall Risk  03/31/2017 02/14/2017 02/10/2017  Falls in the past year? Yes Yes Yes  Number falls in past yr: 2 or more 2 or more 2 or more  Injury with Fall? No No No  Risk Factor Category  High Fall Risk High Fall Risk High Fall Risk  Risk for fall due to : History of fall(s);Impaired balance/gait;Medication side effect History of fall(s);Impaired balance/gait;Impaired mobility History of fall(s);Impaired balance/gait;Impaired mobility  Follow up Falls evaluation completed;Falls prevention discussed Falls prevention discussed Education provided;Falls prevention discussed   PHQ 2/9 Scores 02/14/2017 02/10/2017 01/27/2017 12/29/2016 10/03/2015 04/21/2015 04/14/2015  PHQ - 2 Score 2 0 5 0 1 2 2   PHQ- 9 Score 5 - 7 - - 6 5    Assessment:  RN CM reviewed medications with pt, reviewed safety precautions, energy conservation.  RN CM discussed discharge plan with pt and due to probable upcoming surgery pt does not want discharge at this time and requests RN CM call her back next month and pt will know about surgery at that time.  RN CM faxed quarterly update to primary MD Dr. [name] Crane.  Plan:  Call pt  for telephonic assessment in March Ask about surgery Discuss plan of care and discharge  Jacqlyn Larsen [facility], [place] Coordinator [phone]

## 2017-05-09 ENCOUNTER — Ambulatory Visit (HOSPITAL_COMMUNITY): Payer: PPO

## 2017-05-11 ENCOUNTER — Ambulatory Visit (HOSPITAL_COMMUNITY): Payer: PPO

## 2017-05-11 ENCOUNTER — Ambulatory Visit (HOSPITAL_COMMUNITY)
Admission: RE | Admit: 2017-05-11 | Discharge: 2017-05-11 | Disposition: A | Discharge status: Home / Self Care | Payer: PPO | Source: Ambulatory Visit | Attending: Internal Medicine | Admitting: Internal Medicine

## 2017-05-11 DIAGNOSIS — Z1231 Encounter for screening mammogram for malignant neoplasm of breast: Secondary | ICD-10-CM | POA: Insufficient documentation

## 2017-05-30 ENCOUNTER — Other Ambulatory Visit: Payer: Self-pay | Admitting: *Deleted

## 2017-05-30 ENCOUNTER — Encounter: Payer: Self-pay | Admitting: *Deleted

## 2017-05-30 NOTE — Patient Outreach (Signed)
Telephone call to pt for telephone assessment, spoke with pt, HIPAA verified, pt states her "main concern is just getting through surgery on 06/10/17 for my rotator cuff"  Pt plans to go into short term rehab and states she will call RN CM with her plan, at present pt plans on short term at rehab.  Pt reports " breathing good, I'm not going outside much because of the pollen"  Denies any falls, reports taking all medications as prescribed.  Outpatient Encounter Medications as of 05/30/2017  Medication Sig Note  . acetaminophen (TYLENOL 8 HOUR ARTHRITIS PAIN) 650 MG CR tablet Take 650-1,300 mg by mouth every 8 (eight) hours as needed for pain.   Marland Kitchen albuterol (PROAIR HFA) 108 (90 BASE) MCG/ACT inhaler Inhale 2 puffs into the lungs every 6 (six) hours as needed for wheezing or shortness of breath. For COPD   . ALPRAZolam (XANAX) 0.5 MG tablet Take 0.25-0.5 mg by mouth 2 (two) times daily as needed for anxiety.    . Benralizumab (FASENRA) 30 MG/ML SOSY Inject 30 mg into the skin every 8 (eight) weeks.  05/26/2017: Next dose:06/16/2017  . budesonide-formoterol (SYMBICORT) 160-4.5 MCG/ACT inhaler Inhale 2 puffs into the lungs 2 (two) times daily.   Marland Kitchen EPINEPHrine (EPIPEN 2-PAK) 0.3 mg/0.3 mL DEVI Inject 0.3 mg into the muscle daily as needed (for anaphylatic allergic reactions.).    Marland Kitchen esomeprazole (NEXIUM) 40 MG capsule Take 40 mg by mouth daily before breakfast.    . ferrous sulfate 325 (65 FE) MG tablet Take 325 mg by mouth daily with breakfast.   . fexofenadine (ALLEGRA) 180 MG tablet Take 180 mg by mouth daily.   . fluticasone (FLONASE) 50 MCG/ACT nasal spray Place 1 spray into both nostrils 2 (two) times daily.   . folic acid (FOLVITE) 1 MG tablet Take 1 mg by mouth daily.   . hydrocortisone (ANUSOL-HC) 2.5 % rectal cream Place 1 application rectally 3 (three) times daily. (Patient taking differently: Place 1 application rectally 3 (three) times daily as needed for hemorrhoids (for flare ups/rectal  discomfort). )   . ipratropium-albuterol (DUONEB) 0.5-2.5 (3) MG/3ML SOLN Take 3 mLs by nebulization 2 (two) times daily.    Marland Kitchen ketotifen (ZADITOR) 0.025 % ophthalmic solution Place 1 drop into both eyes 2 (two) times daily.   . Magnesium 250 MG TABS Take 250 mg by mouth daily at 12 noon.    . montelukast (SINGULAIR) 10 MG tablet Take 10 mg by mouth daily.    Marland Kitchen olmesartan (BENICAR) 40 MG tablet Take 40 mg by mouth daily.   Vladimir Faster Glycol-Propyl Glycol (SYSTANE OP) Place 1 drop into both eyes 2 (two) times daily.   . polyethylene glycol (MIRALAX) packet Take 17 g daily by mouth. (Patient taking differently: Take 17 g by mouth daily as needed (for constipation.). )   . potassium chloride (K-DUR) 10 MEQ tablet Take 10 mEq by mouth See admin instructions. Take 10 meq by mouth once daily for 7 days then off for 7 days, then restart 2/33/0076: Cyclically   . predniSONE (DELTASONE) 2.5 MG tablet Take 2.5 mg by mouth daily with breakfast.   . torsemide (DEMADEX) 10 MG tablet Take 10 mg by mouth See admin instructions. Take 10 mg by mouth daily for 7 days then off for 7 days, then restart 05/10/3333: Cyclically   . HYDROcodone-acetaminophen (NORCO/VICODIN) 5-325 MG tablet Take 1-2 tablets every 6 (six) hours as needed by mouth. (Patient not taking: Reported on 03/31/2017)   . Turmeric 500 MG  CAPS Take 500 mg by mouth daily.  05/26/2017: On hold due to upcoming procedure.   No facility-administered encounter medications on file as of 05/30/2017.     THN CM Care Plan Problem One     Most Recent Value  Care Plan Problem One  Decreased quality of life related to chronic pain  Role Documenting the Problem One  Care Management [place] for Problem One  Active  THN Long Term Goal   Pt will verbalize improved quality of life and decreased pain within 60 days  THN Long Term Goal Start Date  04/27/17 [goal re-established-  pt has pain in left shoulder]  Interventions for Problem One Long Term Goal   RN CM talked with pt about upcoming surgery 06/10/17 (rotator cuff surgery) and pt will be going for pre-op this Friday, pt to work closely with MD about which medications to stop taking before surgery.  THN CM Short Term Goal #1   pt will verbalize strategies to maximize pain control within 30 days  THN CM Short Term Goal #1 Start Date  05/30/17 [goal re-established]  Interventions for Short Term Goal #1  Pt verbalizes pain " all over in her joints" today, RN CM talked about pain strategies such as relaxation, prayer which is important to pt, heat and cold therapy, pt plans to go into short term rehab after surgery on 06/10/17 due to "new pain I'll have and won't be able to do much"    [facility] CM Care Plan Problem Two     Most Recent Value  Care Plan Problem Two  Pt high risk for falls  Role Documenting the Problem Two  Care Management Coordinator  Care Plan for Problem Two  Active  THN CM Short Term Goal #1   Pt will have no falls within 30 days  THN CM Short Term Goal #1 Start Date  02/10/17  [facility] CM Short Term Goal #1 Met Date   03/03/17    [facility] CM Care Plan Problem Three     Most Recent Value  Care Plan Problem Three  Pt high risk for respiratory infection  Role Documenting the Problem Three  Care Management Coordinator  Care Plan for Problem Three  Active  THN CM Short Term Goal #1   Pt will see primary MD today and will adhere to prescribed treatment for any diagnosed respiratory issues within 30 days  THN CM Short Term Goal #1 Start Date  03/31/17    PLAN Contact pt after surgery, update plan of care  Jacqlyn Larsen [facility] [place], [place] Coordinator [phone]

## 2017-06-02 NOTE — H&P (Signed)
Glenda Terry is an 77 y.o. female.    Chief Complaint: left shoulder pain  HPI: Pt is a 77 y.o. female complaining of left shoulder pain for multiple years. Pain had continually increased since the beginning. X-rays in the clinic show end-stage arthritic changes of the left shoulder. Pt has tried various conservative treatments which have failed to alleviate their symptoms, including injections and therapy. Various options are discussed with the patient. Risks, benefits and expectations were discussed with the patient. Patient understand the risks, benefits and expectations and wishes to proceed with surgery.   PCP:  Celene Squibb, MD  D/C Plans: Home  PMH: Past Medical History:  Diagnosis Date  . Anxiety   . Back pain, chronic   . CKD (chronic kidney disease) stage 3, GFR 30-59 ml/min (HCC)   . 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)   . Essential hypertension   . GERD (gastroesophageal reflux disease)   . HOH (hard of hearing)   . Inflammatory arthritis   . Iron deficiency anemia 01/2012  . 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 Spinal stenosis   . Tubular adenoma 11/2012    PSH: Past Surgical History:  Procedure Laterality Date  . ABDOMINAL HYSTERECTOMY    . BACK SURGERY    . CARPAL TUNNEL RELEASE Right 4/14  . CATARACT EXTRACTION W/PHACO Left 11/19/2014   Procedure: CATARACT EXTRACTION PHACO AND INTRAOCULAR LENS PLACEMENT (IOC);  Surgeon: Williams Che, MD;  Location: [facility];  Service: Ophthalmology;  Laterality: Left;  CDE: 4.77  . CATARACT EXTRACTION W/PHACO Right 02/03/2015   Procedure: CATARACT EXTRACTION PHACO AND INTRAOCULAR LENS PLACEMENT ([place]);  Surgeon: Williams Che, MD;  Location: [facility];  Service: Ophthalmology;  Laterality: Right;  CDE: 4.54  . COLONOSCOPY  06/19/2008   RMR: tortuous and elongated colon with scattered left-sided diverticula/colonic mucosa appeared entirely normal. Prior colonic ulcers had resolved.  . COLONOSCOPY  12/2007   Dr. Gala Romney: Scattered  diffuse sigmoid diverticula, 2 areas of ulceration at the hepatic flexure. Biopsies unremarkable.  . COLONOSCOPY N/A 11/20/2012   next TCS 11/2017  . DECOMPRESSIVE LUMBAR LAMINECTOMY LEVEL 2  04/20/2012   Procedure: DECOMPRESSIVE LUMBAR LAMINECTOMY LEVEL 2;  Surgeon: Tobi Bastos, MD;  Location: [facility];  Service: Orthopedics;  Laterality: Right;  Decompressive Lumbar Laminectomy of the L4 - L5 and L5 - S1 Complete/Laminectomy L5 on the Right (X-Ray)  . ESOPHAGOGASTRODUODENOSCOPY  12/2007   Dr. 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 Small hiatal hernia. Slightly pale duodenal mucosa (biopsy unremarkable)  . ESOPHAGOGASTRODUODENOSCOPY (EGD) WITH PROPOFOL N/A 01/20/2017   Procedure: ESOPHAGOGASTRODUODENOSCOPY (EGD) WITH PROPOFOL;  Surgeon: Daneil Dolin, MD;  Location: [facility];  Service: Endoscopy;  Laterality: N/A;  12:30pm  . IR KYPHO THORACIC WITH BONE BIOPSY  02/08/2017  . IR RADIOLOGIST EVAL & MGMT  02/02/2017  . KNEE ARTHROSCOPY Right   . MALONEY DILATION N/A 01/20/2017   Procedure: Venia Minks DILATION;  Surgeon: Daneil Dolin, MD;  Location: [facility];  Service: Endoscopy;  Laterality: N/A;  . REVERSE SHOULDER ARTHROPLASTY Right 05/24/2014   Procedure: RIGHT SHOULDER REVERSE ARTHROPLASTY;  Surgeon: Netta Cedars, MD;  Location: [place];  Service: Orthopedics;  Laterality: Right;  . TOTAL KNEE ARTHROPLASTY  01/24/2012   Procedure: TOTAL KNEE ARTHROPLASTY;  Surgeon: Gearlean Alf, MD;  Location: [facility];  Service: Orthopedics;  Laterality: Right;  . TUBAL LIGATION      Social History:  reports that she quit smoking about 25  years ago. Her smoking use included cigarettes. She has a 80.00 pack-year smoking history. She has never used smokeless tobacco. She reports that she does not drink alcohol or use drugs.  Allergies:  Allergies  Allergen Reactions  . Flexeril [Cyclobenzaprine] Anaphylaxis  . Other Anaphylaxis and Other (See  Comments)    ALL TREE NUTS  . Keflex [Cephalexin] Itching  . Aspirin Other (See Comments)    Cannot take due to ulcers  . Statins Other (See Comments)    is pain and swelling   . Adhesive [Tape] Rash  . Chlorhexidine Rash    Medications: No current facility-administered medications for this encounter.    Current Outpatient Medications  Medication Sig Dispense Refill  . acetaminophen (TYLENOL 8 HOUR ARTHRITIS PAIN) 650 MG CR tablet Take 650-1,300 mg by mouth every 8 (eight) hours as needed for pain.    Marland Kitchen albuterol (PROAIR HFA) 108 (90 BASE) MCG/ACT inhaler Inhale 2 puffs into the lungs every 6 (six) hours as needed for wheezing or shortness of breath. For COPD    . ALPRAZolam (XANAX) 0.5 MG tablet Take 0.25-0.5 mg by mouth 2 (two) times daily as needed for anxiety.     . Benralizumab (FASENRA) 30 MG/ML SOSY Inject 30 mg into the skin every 8 (eight) weeks.     . budesonide-formoterol (SYMBICORT) 160-4.5 MCG/ACT inhaler Inhale 2 puffs into the lungs 2 (two) times daily.    Marland Kitchen EPINEPHrine (EPIPEN 2-PAK) 0.3 mg/0.3 mL DEVI Inject 0.3 mg into the muscle daily as needed (for anaphylatic allergic reactions.).     Marland Kitchen esomeprazole (NEXIUM) 40 MG capsule Take 40 mg by mouth daily before breakfast.     . ferrous sulfate 325 (65 FE) MG tablet Take 325 mg by mouth daily with breakfast.    . fexofenadine (ALLEGRA) 180 MG tablet Take 180 mg by mouth daily.    . fluticasone (FLONASE) 50 MCG/ACT nasal spray Place 1 spray into both nostrils 2 (two) times daily.    . folic acid (FOLVITE) 1 MG tablet Take 1 mg by mouth daily.    . hydrocortisone (ANUSOL-HC) 2.5 % rectal cream Place 1 application rectally 3 (three) times daily. (Patient taking differently: Place 1 application rectally 3 (three) times daily as needed for hemorrhoids (for flare ups/rectal discomfort). ) 30 g 3  . ipratropium-albuterol (DUONEB) 0.5-2.5 (3) MG/3ML SOLN Take 3 mLs by nebulization 2 (two) times daily.     Marland Kitchen ketotifen (ZADITOR) 0.025  % ophthalmic solution Place 1 drop into both eyes 2 (two) times daily.    . Magnesium 250 MG TABS Take 250 mg by mouth daily at 12 noon.     . montelukast (SINGULAIR) 10 MG tablet Take 10 mg by mouth daily.     Marland Kitchen olmesartan (BENICAR) 40 MG tablet Take 40 mg by mouth daily.    Vladimir Faster Glycol-Propyl Glycol (SYSTANE OP) Place 1 drop into both eyes 2 (two) times daily.    . polyethylene glycol (MIRALAX) packet Take 17 g daily by mouth. (Patient taking differently: Take 17 g by mouth daily as needed (for constipation.). ) 14 each 2  . potassium chloride (K-DUR) 10 MEQ tablet Take 10 mEq by mouth See admin instructions. Take 10 meq by mouth once daily for 7 days then off for 7 days, then restart    . predniSONE (DELTASONE) 2.5 MG tablet Take 2.5 mg by mouth daily with breakfast.    . torsemide (DEMADEX) 10 MG tablet Take 10 mg by mouth See admin instructions.  Take 10 mg by mouth daily for 7 days then off for 7 days, then restart    . HYDROcodone-acetaminophen (NORCO/VICODIN) 5-325 MG tablet Take 1-2 tablets every 6 (six) hours as needed by mouth. (Patient not taking: Reported on 03/31/2017) 20 tablet 0  . Turmeric 500 MG CAPS Take 500 mg by mouth daily.       No results found for this or any previous visit (from the past 48 hour(s)). No results found.  ROS: Pain with rom of the left upper extremity  Physical Exam:  Alert and oriented 77 y.o. female in no acute distress Cranial nerves 2-12 intact Cervical spine: full rom with no tenderness, nv intact distally Chest: active breath sounds bilaterally, no wheeze rhonchi or rales Heart: regular rate and rhythm, no murmur Abd: non tender non distended with active bowel sounds Hip is stable with rom  Left shoulder with limited rom and moderate weakness with ER and IR No rashes or edema nv intact distally  Assessment/Plan Assessment: left shoulder cuff arthropathy  Plan: Patient will undergo a left reverse total shoulder  by Dr. Veverly Fells at  [facility]. Risks benefits and expectations were discussed with the patient. Patient understand risks, benefits and expectations and wishes to proceed.  Merla Riches PA-C, MPAS [facility] is now [employer] [address], [place], [place], [place] [address] Phone: [phone] [email or web address] Facebook  [employer]

## 2017-06-02 NOTE — Pre-Procedure Instructions (Signed)
Glenda Terry  06/02/2017      [name], [name] [address] Phone: [phone] Fax: [phone]    Your procedure is scheduled on Friday, March 29th   Report to [facility] Admitting at 8:30 AM             (posted surgery time 10:30a - 1:00p)   Call this number if you have problems the morning of surgery:  [phone]   Remember:              4-5 days prior to surgery, STOP TAKING any Vitamins, Herbal Supplements, Anti-inflammatories, Blood thinners.   Do not eat food or drink liquids after midnight, Thursday.   Take these medicines the morning of surgery with A SIP OF WATER : Xanax, Nexium, Prednisone.  Please use your nebulizer / inhaler that morning.  May use Flonase.   Do not wear jewelry, make-up or nail polish.  Do not wear lotions, powders,  perfumes, or deodorant.  Do not shave 48 hours prior to surgery.   Do not bring valuables to the hospital.  [facility] is not responsible for any belongings or valuables.  Contacts, dentures or bridgework may not be worn into surgery.  Leave your suitcase in the car.  After surgery it may be brought to your room.  For patients admitted to the hospital, discharge time will be determined by your treatment team.  Please read over the following fact sheets that you were given. Pain Booklet, MRSA Information and Surgical Site Infection Prevention      [place]- Preparing For Surgery  Before surgery, you can play an important role. Because skin is not sterile, your skin needs to be as free of germs as possible. You can reduce the number of germs on your skin by washing with CHG (chlorahexidine gluconate) Soap before surgery.  CHG is an antiseptic cleaner which kills germs and bonds with the skin to continue killing germs even after washing.  Please do not use if you have an allergy to CHG or antibacterial soaps. If your skin becomes  reddened/irritated stop using the CHG.  Do not shave (including legs and underarms) for at least 48 hours prior to first CHG shower. It is OK to shave your face.  Please follow these instructions carefully.   1. Shower the NIGHT BEFORE SURGERY and the MORNING OF SURGERY with CHG.   2. If you chose to wash your hair, wash your hair first as usual with your normal shampoo.  3. After you shampoo, rinse your hair and body thoroughly to remove the shampoo.  4. Use CHG as you would any other liquid soap. You can apply CHG directly to the skin and wash gently with a scrungie or a clean washcloth.   5. Apply the CHG Soap to your body ONLY FROM THE NECK DOWN.  Do not use on open wounds or open sores. Avoid contact with your eyes, ears, mouth and genitals (private parts). Wash Face and genitals (private parts)  with your normal soap.  6. Wash thoroughly, paying special attention to the area where your surgery will be performed.  7. Thoroughly rinse your body with warm water from the neck down.  8. DO NOT shower/wash with your normal soap after using and rinsing off the CHG Soap.  9. Pat yourself dry with a CLEAN TOWEL.  10. Wear CLEAN PAJAMAS to bed the night before surgery, wear  comfortable clothes the morning of surgery  11. Place CLEAN SHEETS on your bed the night of your first shower and DO NOT SLEEP WITH PETS.    Day of Surgery: Do not apply any deodorants/lotions. Please wear clean clothes to the hospital/surgery center.

## 2017-06-03 ENCOUNTER — Encounter (HOSPITAL_COMMUNITY): Payer: Self-pay

## 2017-06-03 ENCOUNTER — Encounter (HOSPITAL_COMMUNITY)
Admission: RE | Admit: 2017-06-03 | Discharge: 2017-06-03 | Disposition: A | Discharge status: Home / Self Care | Payer: PPO | Source: Ambulatory Visit | Attending: Orthopedic Surgery | Admitting: Orthopedic Surgery

## 2017-06-03 ENCOUNTER — Other Ambulatory Visit: Payer: Self-pay

## 2017-06-03 DIAGNOSIS — Z7951 Long term (current) use of inhaled steroids: Secondary | ICD-10-CM | POA: Insufficient documentation

## 2017-06-03 DIAGNOSIS — J4550 Severe persistent asthma, uncomplicated: Secondary | ICD-10-CM | POA: Insufficient documentation

## 2017-06-03 DIAGNOSIS — Z981 Arthrodesis status: Secondary | ICD-10-CM | POA: Diagnosis not present

## 2017-06-03 DIAGNOSIS — D721 Eosinophilia: Secondary | ICD-10-CM | POA: Diagnosis not present

## 2017-06-03 DIAGNOSIS — Z9849 Cataract extraction status, unspecified eye: Secondary | ICD-10-CM | POA: Insufficient documentation

## 2017-06-03 DIAGNOSIS — D509 Iron deficiency anemia, unspecified: Secondary | ICD-10-CM | POA: Diagnosis not present

## 2017-06-03 DIAGNOSIS — E249 Cushing's syndrome, unspecified: Secondary | ICD-10-CM | POA: Insufficient documentation

## 2017-06-03 DIAGNOSIS — N183 Chronic kidney disease, stage 3 (moderate): Secondary | ICD-10-CM | POA: Diagnosis not present

## 2017-06-03 DIAGNOSIS — Z01818 Encounter for other preprocedural examination: Secondary | ICD-10-CM | POA: Diagnosis not present

## 2017-06-03 DIAGNOSIS — Z9889 Other specified postprocedural states: Secondary | ICD-10-CM | POA: Insufficient documentation

## 2017-06-03 DIAGNOSIS — I10 Essential (primary) hypertension: Secondary | ICD-10-CM | POA: Diagnosis not present

## 2017-06-03 DIAGNOSIS — I129 Hypertensive chronic kidney disease with stage 1 through stage 4 chronic kidney disease, or unspecified chronic kidney disease: Secondary | ICD-10-CM | POA: Diagnosis not present

## 2017-06-03 DIAGNOSIS — Z87891 Personal history of nicotine dependence: Secondary | ICD-10-CM | POA: Diagnosis not present

## 2017-06-03 DIAGNOSIS — K219 Gastro-esophageal reflux disease without esophagitis: Secondary | ICD-10-CM | POA: Diagnosis not present

## 2017-06-03 DIAGNOSIS — Z96651 Presence of right artificial knee joint: Secondary | ICD-10-CM | POA: Insufficient documentation

## 2017-06-03 DIAGNOSIS — Z79899 Other long term (current) drug therapy: Secondary | ICD-10-CM | POA: Insufficient documentation

## 2017-06-03 HISTORY — DX: Wedge compression fracture of unspecified thoracic vertebra, initial encounter for closed fracture: S22.000A

## 2017-06-03 LAB — CBC
HEMATOCRIT: 36.4 % (ref 36.0–46.0)
HEMOGLOBIN: 11.1 g/dL — AB (ref 12.0–15.0)
MCH: 24.2 pg — AB (ref 26.0–34.0)
MCHC: 30.5 g/dL (ref 30.0–36.0)
MCV: 79.3 fL (ref 78.0–100.0)
Platelets: 295 10*3/uL (ref 150–400)
RBC: 4.59 MIL/uL (ref 3.87–5.11)
RDW: 14.7 % (ref 11.5–15.5)
WBC: 9.2 10*3/uL (ref 4.0–10.5)

## 2017-06-03 LAB — BASIC METABOLIC PANEL
Anion gap: 9 (ref 5–15)
BUN: 12 mg/dL (ref 6–20)
CHLORIDE: 104 mmol/L (ref 101–111)
CO2: 23 mmol/L (ref 22–32)
Calcium: 9.4 mg/dL (ref 8.9–10.3)
Creatinine, Ser: 1.3 mg/dL — ABNORMAL HIGH (ref 0.44–1.00)
GFR calc Af Amer: 46 mL/min — ABNORMAL LOW (ref >60)
GFR calc non Af Amer: 39 mL/min — ABNORMAL LOW (ref >60)
Glucose, Bld: 100 mg/dL — ABNORMAL HIGH (ref 65–99)
POTASSIUM: 3.7 mmol/L (ref 3.5–5.1)
SODIUM: 136 mmol/L (ref 135–145)

## 2017-06-03 LAB — SURGICAL PCR SCREEN
MRSA, PCR: NEGATIVE
Staphylococcus aureus: NEGATIVE

## 2017-06-03 NOTE — Progress Notes (Addendum)
PCP is Dr. Casimer Leek  LOV 02/2017 Cardio is Dr. Myles Gip LOV 02/2017.  Note in epic.   She denies murmur, cp.  Does have faint doe. Pulmonary is Dr. Soledad Gerlach,  LOV 04/2017  Has pulmonary esosinophilia.  Uses nebs & inhalers. And takes Berna Bue every 8 weeks. She does NOT like chlorhexadine-- have instructed her to use Dial soap.

## 2017-06-03 NOTE — Progress Notes (Signed)
   06/03/17 0952  OBSTRUCTIVE SLEEP APNEA  Have you ever been diagnosed with sleep apnea through a sleep study? No  Do you snore loudly (loud enough to be heard through closed doors)?  0  Do you often feel tired, fatigued, or sleepy during the daytime (such as falling asleep during driving or talking to someone)? 0  Has anyone observed you stop breathing during your sleep? 0  Do you have, or are you being treated for high blood pressure? 1  BMI more than 35 kg/m2? 1  Age > 50 (1-yes) 1  Neck circumference greater than:Female 16 inches or larger, Female 17inches or larger? 1  Female Gender (Yes=1) 0  Obstructive Sleep Apnea Score 4  Score 5 or greater  Results sent to PCP

## 2017-06-06 NOTE — Progress Notes (Signed)
Anesthesia Chart Review:  Pt is a 77 year old female scheduled for L reverse shoulder arthroplasty on 06/10/2017 with Netta Cedars, MD  Providers:  - PCP is Edwinna Areola. [name] Crane, MD - Cardiologist is Rozann Lesches, MD  - Pulmonologist is Soledad Gerlach, MD. Last office visit 04/21/17 (notes in care everywhere). Dr. Laurance Flatten documents: "From the upcoming orthopedic procedure perspective patient is stable from a respiratory point of view. We recommend that in the perioperative period patient is continued on her bronchodilators both scheduled as well as on as needed basis. It would be preferable to to the surgery under nerve block rather than general anesthesia if possible. We did also recommend postoperative use of a BiPAP device to prevent any hypoxia/hypercarbia.   Anesthesia could consider whether stress doses of hydrocortisone peri-operatively also given recent steroid dependence."    PMH includes:   - HTN, pulmonary eosinophilia, severe persistent asthma, Cushing's syndrome, iron deficiency anemia, CKD (stage III), GERD. Former smoker (quit 1993). BMI 39.5.  - S/p cataract extraction 11/22/14 and 02/03/15. S/p R shoulder arthroplasty 05/24/14. S/p lumbar laminectomy 04/20/12. S/p R TKA 01/24/12  Medications include: Albuterol, fasenra, Symbicort, Nexium, iron, folic acid, DuoNeb, olmesartan, potassium, prednisone, torsemide  BP (!) 156/81   Pulse 81   Temp 36.7 C   Resp 20   Ht 4' 11.5" (1.511 m)   Wt 199 lb 3.2 oz (90.4 kg)   SpO2 100%   BMI 39.56 kg/m   Preoperative labs reviewed.    EKG 01/21/17: Sinus tachycardia (128 bpm). LAFB. Low voltage, precordial leads. Abnormal R-wave progression, late transition  Cardiac event monitor 02/24/17:  - Available strips from 7 day event recorder reviewed. There was only one returned strip that demonstrated sinus tachycardia.  CT abdomen/pelvis 01/21/17:  1. Progression of inferior endplate compression with 25% height loss of the T11 vertebral body since  recent comparison exam from September. This is believed to account for the sharp intermittent upper abdominal and back pain. 2. Colonic diverticulosis without acute diverticulitis. 3. Increased colonic stool burden throughout the colon consistent constipation. 4. Hepatic steatosis. Faint 17 mm nonspecific right hepatic dome hypodensity, focal fatty infiltration, hemangioma are some more likely considerations. 5. Small fat containing stable umbilical hernia. 6. Stable small to moderate-sized hiatal hernia. 7. Aortic atherosclerosis. 8. No free air free nor fluid status post endoscopy  Echo 09/22/15:  - Left ventricle: The cavity size was normal. Wall thickness was normal. Systolic function was normal. The estimated ejection fraction was in the range of 55% to 60%. Doppler parameters are consistent with abnormal left ventricular relaxation (grade 1 diastolic dysfunction).  If no changes, I anticipate pt can proceed with surgery as scheduled.   Willeen Cass, FNP-BC [facility]/Anesthesiology Phone: [phone] 06/06/2017 2:33 PM

## 2017-06-06 NOTE — Anesthesia Preprocedure Evaluation (Addendum)
Anesthesia Evaluation  Patient identified by MRN, date of birth, ID band Patient awake    Reviewed: Allergy & Precautions, H&P , NPO status , Patient's Chart, lab work & pertinent test results  Airway Mallampati: II   Neck ROM: full    Dental   Pulmonary asthma , former smoker,  Pulmonary eosinophilia.     breath sounds clear to auscultation       Cardiovascular hypertension,  Rhythm:regular Rate:Normal     Neuro/Psych PSYCHIATRIC DISORDERS Anxiety    GI/Hepatic GERD  ,  Endo/Other  Morbid obesityCushing's syndrome  Renal/GU Renal InsufficiencyRenal disease     Musculoskeletal  (+) Arthritis , Osteoarthritis,    Abdominal   Peds  Hematology   Anesthesia Other Findings   Reproductive/Obstetrics                           Anesthesia Physical Anesthesia Plan  ASA: III  Anesthesia Plan: General   Post-op Pain Management:  Regional for Post-op pain   Induction: Intravenous  PONV Risk Score and Plan: 3 and Ondansetron, Dexamethasone, Midazolam and Treatment may vary due to age or medical condition  Airway Management Planned: Oral ETT  Additional Equipment:   Intra-op Plan:   Post-operative Plan: Extubation in OR  Informed Consent: I have reviewed the patients History and Physical, chart, labs and discussed the procedure including the risks, benefits and alternatives for the proposed anesthesia with the patient or authorized representative who has indicated his/her understanding and acceptance.     Plan Discussed with: CRNA, Anesthesiologist and Surgeon  Anesthesia Plan Comments: ((Please see my note for pulmonology recommendations peri-op. Durel Salts, FNP))       Anesthesia Quick Evaluation

## 2017-06-10 ENCOUNTER — Encounter (HOSPITAL_COMMUNITY): Payer: Self-pay | Admitting: *Deleted

## 2017-06-10 ENCOUNTER — Encounter (HOSPITAL_COMMUNITY)
Admission: RE | Disposition: A | Discharge status: Skilled Nursing Facility | Payer: Self-pay | Source: Ambulatory Visit | Attending: Orthopedic Surgery

## 2017-06-10 ENCOUNTER — Inpatient Hospital Stay (HOSPITAL_COMMUNITY): Payer: PPO | Admitting: Emergency Medicine

## 2017-06-10 ENCOUNTER — Inpatient Hospital Stay (HOSPITAL_COMMUNITY): Payer: PPO

## 2017-06-10 ENCOUNTER — Inpatient Hospital Stay (HOSPITAL_COMMUNITY)
Admission: RE | Admit: 2017-06-10 | Discharge: 2017-06-14 | DRG: 483 | Disposition: A | Discharge status: Skilled Nursing Facility | Payer: PPO | Source: Ambulatory Visit | Attending: Orthopedic Surgery | Admitting: Orthopedic Surgery

## 2017-06-10 ENCOUNTER — Inpatient Hospital Stay (HOSPITAL_COMMUNITY): Payer: PPO | Admitting: Anesthesiology

## 2017-06-10 DIAGNOSIS — M4854XD Collapsed vertebra, not elsewhere classified, thoracic region, subsequent encounter for fracture with routine healing: Secondary | ICD-10-CM | POA: Diagnosis not present

## 2017-06-10 DIAGNOSIS — R262 Difficulty in walking, not elsewhere classified: Secondary | ICD-10-CM | POA: Diagnosis not present

## 2017-06-10 DIAGNOSIS — Z90710 Acquired absence of both cervix and uterus: Secondary | ICD-10-CM

## 2017-06-10 DIAGNOSIS — Z86010 Personal history of colonic polyps: Secondary | ICD-10-CM | POA: Diagnosis not present

## 2017-06-10 DIAGNOSIS — K219 Gastro-esophageal reflux disease without esophagitis: Secondary | ICD-10-CM | POA: Diagnosis not present

## 2017-06-10 DIAGNOSIS — E249 Cushing's syndrome, unspecified: Secondary | ICD-10-CM | POA: Diagnosis not present

## 2017-06-10 DIAGNOSIS — Z91048 Other nonmedicinal substance allergy status: Secondary | ICD-10-CM

## 2017-06-10 DIAGNOSIS — Z9842 Cataract extraction status, left eye: Secondary | ICD-10-CM | POA: Diagnosis not present

## 2017-06-10 DIAGNOSIS — M545 Low back pain: Secondary | ICD-10-CM | POA: Diagnosis not present

## 2017-06-10 DIAGNOSIS — N183 Chronic kidney disease, stage 3 (moderate): Secondary | ICD-10-CM | POA: Diagnosis present

## 2017-06-10 DIAGNOSIS — I129 Hypertensive chronic kidney disease with stage 1 through stage 4 chronic kidney disease, or unspecified chronic kidney disease: Secondary | ICD-10-CM | POA: Diagnosis present

## 2017-06-10 DIAGNOSIS — Z96611 Presence of right artificial shoulder joint: Secondary | ICD-10-CM | POA: Diagnosis not present

## 2017-06-10 DIAGNOSIS — Z471 Aftercare following joint replacement surgery: Secondary | ICD-10-CM | POA: Diagnosis not present

## 2017-06-10 DIAGNOSIS — I10 Essential (primary) hypertension: Secondary | ICD-10-CM | POA: Diagnosis not present

## 2017-06-10 DIAGNOSIS — Z961 Presence of intraocular lens: Secondary | ICD-10-CM | POA: Diagnosis present

## 2017-06-10 DIAGNOSIS — Z886 Allergy status to analgesic agent status: Secondary | ICD-10-CM

## 2017-06-10 DIAGNOSIS — J449 Chronic obstructive pulmonary disease, unspecified: Secondary | ICD-10-CM | POA: Diagnosis not present

## 2017-06-10 DIAGNOSIS — J82 Pulmonary eosinophilia, not elsewhere classified: Secondary | ICD-10-CM | POA: Diagnosis present

## 2017-06-10 DIAGNOSIS — M19012 Primary osteoarthritis, left shoulder: Secondary | ICD-10-CM | POA: Diagnosis not present

## 2017-06-10 DIAGNOSIS — G8918 Other acute postprocedural pain: Secondary | ICD-10-CM | POA: Diagnosis not present

## 2017-06-10 DIAGNOSIS — Z9841 Cataract extraction status, right eye: Secondary | ICD-10-CM

## 2017-06-10 DIAGNOSIS — F419 Anxiety disorder, unspecified: Secondary | ICD-10-CM | POA: Diagnosis not present

## 2017-06-10 DIAGNOSIS — H9190 Unspecified hearing loss, unspecified ear: Secondary | ICD-10-CM | POA: Diagnosis present

## 2017-06-10 DIAGNOSIS — Z96612 Presence of left artificial shoulder joint: Secondary | ICD-10-CM

## 2017-06-10 DIAGNOSIS — D509 Iron deficiency anemia, unspecified: Secondary | ICD-10-CM | POA: Diagnosis not present

## 2017-06-10 DIAGNOSIS — Z96651 Presence of right artificial knee joint: Secondary | ICD-10-CM | POA: Diagnosis not present

## 2017-06-10 DIAGNOSIS — M75102 Unspecified rotator cuff tear or rupture of left shoulder, not specified as traumatic: Secondary | ICD-10-CM | POA: Diagnosis present

## 2017-06-10 DIAGNOSIS — Z6839 Body mass index (BMI) 39.0-39.9, adult: Secondary | ICD-10-CM | POA: Diagnosis not present

## 2017-06-10 DIAGNOSIS — M6281 Muscle weakness (generalized): Secondary | ICD-10-CM | POA: Diagnosis not present

## 2017-06-10 DIAGNOSIS — S4990XA Unspecified injury of shoulder and upper arm, unspecified arm, initial encounter: Secondary | ICD-10-CM | POA: Diagnosis not present

## 2017-06-10 DIAGNOSIS — Z7951 Long term (current) use of inhaled steroids: Secondary | ICD-10-CM | POA: Diagnosis not present

## 2017-06-10 DIAGNOSIS — Z87891 Personal history of nicotine dependence: Secondary | ICD-10-CM

## 2017-06-10 DIAGNOSIS — M064 Inflammatory polyarthropathy: Secondary | ICD-10-CM | POA: Diagnosis not present

## 2017-06-10 DIAGNOSIS — M25712 Osteophyte, left shoulder: Secondary | ICD-10-CM | POA: Diagnosis not present

## 2017-06-10 DIAGNOSIS — Z7952 Long term (current) use of systemic steroids: Secondary | ICD-10-CM | POA: Diagnosis not present

## 2017-06-10 DIAGNOSIS — Z91018 Allergy to other foods: Secondary | ICD-10-CM

## 2017-06-10 DIAGNOSIS — Z888 Allergy status to other drugs, medicaments and biological substances status: Secondary | ICD-10-CM

## 2017-06-10 DIAGNOSIS — G8911 Acute pain due to trauma: Secondary | ICD-10-CM | POA: Diagnosis not present

## 2017-06-10 DIAGNOSIS — Z883 Allergy status to other anti-infective agents status: Secondary | ICD-10-CM

## 2017-06-10 DIAGNOSIS — M12812 Other specific arthropathies, not elsewhere classified, left shoulder: Secondary | ICD-10-CM | POA: Diagnosis not present

## 2017-06-10 DIAGNOSIS — M4805 Spinal stenosis, thoracolumbar region: Secondary | ICD-10-CM | POA: Diagnosis not present

## 2017-06-10 DIAGNOSIS — Z79899 Other long term (current) drug therapy: Secondary | ICD-10-CM

## 2017-06-10 HISTORY — PX: REVERSE SHOULDER ARTHROPLASTY: SHX5054

## 2017-06-10 SURGERY — ARTHROPLASTY, SHOULDER, TOTAL, REVERSE
Anesthesia: General | Site: Shoulder | Laterality: Left

## 2017-06-10 MED ORDER — ALPRAZOLAM 0.25 MG PO TABS
0.2500 mg | ORAL_TABLET | Freq: Two times a day (BID) | ORAL | Status: DC | PRN
  Administered 2017-06-10 – 2017-06-12 (×4): 0.5 mg via ORAL
  Administered 2017-06-13 (×2): 0.25 mg via ORAL
  Filled 2017-06-10 (×2): qty 1
  Filled 2017-06-10 (×4): qty 2

## 2017-06-10 MED ORDER — MIDAZOLAM HCL 2 MG/2ML IJ SOLN
1.0000 mg | Freq: Once | INTRAMUSCULAR | Status: AC
  Administered 2017-06-10: 1 mg via INTRAVENOUS

## 2017-06-10 MED ORDER — ONDANSETRON HCL 4 MG/2ML IJ SOLN
INTRAMUSCULAR | Status: DC | PRN
  Administered 2017-06-10: 4 mg via INTRAVENOUS

## 2017-06-10 MED ORDER — KETOTIFEN FUMARATE 0.025 % OP SOLN
1.0000 [drp] | Freq: Two times a day (BID) | OPHTHALMIC | Status: DC
  Administered 2017-06-11 – 2017-06-14 (×8): 1 [drp] via OPHTHALMIC
  Filled 2017-06-10 (×2): qty 5

## 2017-06-10 MED ORDER — METOCLOPRAMIDE HCL 5 MG/ML IJ SOLN
5.0000 mg | Freq: Three times a day (TID) | INTRAMUSCULAR | Status: DC | PRN
  Administered 2017-06-14: 10 mg via INTRAVENOUS
  Filled 2017-06-10: qty 2

## 2017-06-10 MED ORDER — POVIDONE-IODINE 10 % EX SWAB
2.0000 | Freq: Once | CUTANEOUS | Status: AC
Start: 2017-06-10 — End: 2017-06-10
  Administered 2017-06-10: 2 via TOPICAL

## 2017-06-10 MED ORDER — MIDAZOLAM HCL 2 MG/2ML IJ SOLN
INTRAMUSCULAR | Status: AC
  Filled 2017-06-10: qty 2

## 2017-06-10 MED ORDER — HYDROCORTISONE 2.5 % RE CREA
1.0000 "application " | TOPICAL_CREAM | Freq: Three times a day (TID) | RECTAL | Status: DC | PRN
  Filled 2017-06-10: qty 28.35

## 2017-06-10 MED ORDER — FENTANYL CITRATE (PF) 100 MCG/2ML IJ SOLN
50.0000 ug | Freq: Once | INTRAMUSCULAR | Status: AC
  Administered 2017-06-10: 50 ug via INTRAVENOUS

## 2017-06-10 MED ORDER — METOCLOPRAMIDE HCL 5 MG PO TABS: 5.0000 mg | ORAL_TABLET | Freq: Three times a day (TID) | ORAL | Status: DC | PRN

## 2017-06-10 MED ORDER — MAGNESIUM 250 MG PO TABS: 250.0000 mg | ORAL_TABLET | Freq: Every day | ORAL | Status: DC

## 2017-06-10 MED ORDER — BUPIVACAINE-EPINEPHRINE (PF) 0.5% -1:200000 IJ SOLN
INTRAMUSCULAR | Status: DC | PRN
  Administered 2017-06-10: 30 mL via PERINEURAL

## 2017-06-10 MED ORDER — PREDNISONE 2.5 MG PO TABS
2.5000 mg | ORAL_TABLET | Freq: Every day | ORAL | Status: DC
  Administered 2017-06-11 – 2017-06-14 (×3): 2.5 mg via ORAL
  Filled 2017-06-10 (×5): qty 1

## 2017-06-10 MED ORDER — TORSEMIDE 10 MG PO TABS
10.0000 mg | ORAL_TABLET | Freq: Every day | ORAL | Status: DC
  Administered 2017-06-11 – 2017-06-14 (×3): 10 mg via ORAL
  Filled 2017-06-10 (×5): qty 1

## 2017-06-10 MED ORDER — OXYCODONE HCL 5 MG/5ML PO SOLN: 5.0000 mg | Freq: Once | ORAL | Status: DC | PRN

## 2017-06-10 MED ORDER — HYDROCODONE-ACETAMINOPHEN 7.5-325 MG PO TABS
1.0000 | ORAL_TABLET | ORAL | Status: DC | PRN
  Administered 2017-06-12 – 2017-06-14 (×6): 2 via ORAL
  Filled 2017-06-10 (×6): qty 2

## 2017-06-10 MED ORDER — ACETAMINOPHEN 325 MG PO TABS: 325.0000 mg | ORAL_TABLET | Freq: Four times a day (QID) | ORAL | Status: DC | PRN

## 2017-06-10 MED ORDER — BISACODYL 10 MG RE SUPP: 10.0000 mg | Freq: Every day | RECTAL | Status: DC | PRN

## 2017-06-10 MED ORDER — ACETAMINOPHEN 500 MG PO TABS
500.0000 mg | ORAL_TABLET | Freq: Four times a day (QID) | ORAL | Status: AC
  Administered 2017-06-10 – 2017-06-11 (×3): 500 mg via ORAL
  Filled 2017-06-10 (×3): qty 1

## 2017-06-10 MED ORDER — POLYVINYL ALCOHOL 1.4 % OP SOLN
1.0000 [drp] | Freq: Two times a day (BID) | OPHTHALMIC | Status: DC
  Administered 2017-06-10 – 2017-06-14 (×8): 1 [drp] via OPHTHALMIC
  Filled 2017-06-10: qty 15

## 2017-06-10 MED ORDER — BUPIVACAINE-EPINEPHRINE (PF) 0.25% -1:200000 IJ SOLN
INTRAMUSCULAR | Status: AC
  Filled 2017-06-10: qty 30

## 2017-06-10 MED ORDER — MONTELUKAST SODIUM 10 MG PO TABS
10.0000 mg | ORAL_TABLET | Freq: Every day | ORAL | Status: DC
  Administered 2017-06-10 – 2017-06-14 (×5): 10 mg via ORAL
  Filled 2017-06-10 (×5): qty 1

## 2017-06-10 MED ORDER — MORPHINE SULFATE (PF) 2 MG/ML IV SOLN
1.0000 mg | INTRAVENOUS | Status: DC | PRN
  Administered 2017-06-11: 1 mg via INTRAVENOUS
  Filled 2017-06-10: qty 1

## 2017-06-10 MED ORDER — TURMERIC 500 MG PO CAPS: 500.0000 mg | ORAL_CAPSULE | Freq: Every day | ORAL | Status: DC

## 2017-06-10 MED ORDER — LORATADINE 10 MG PO TABS
10.0000 mg | ORAL_TABLET | Freq: Every day | ORAL | Status: DC
  Administered 2017-06-11 – 2017-06-14 (×4): 10 mg via ORAL
  Filled 2017-06-10 (×4): qty 1

## 2017-06-10 MED ORDER — POLYETHYL GLYCOL-PROPYL GLYCOL 0.4-0.3 % OP GEL
Freq: Two times a day (BID) | OPHTHALMIC | Status: DC
Start: 2017-06-10 — End: 2017-06-10
  Filled 2017-06-10: qty 10

## 2017-06-10 MED ORDER — ONDANSETRON HCL 4 MG/2ML IJ SOLN
4.0000 mg | Freq: Four times a day (QID) | INTRAMUSCULAR | Status: DC | PRN
  Administered 2017-06-12 – 2017-06-14 (×3): 4 mg via INTRAVENOUS
  Filled 2017-06-10 (×3): qty 2

## 2017-06-10 MED ORDER — SUGAMMADEX SODIUM 200 MG/2ML IV SOLN
INTRAVENOUS | Status: AC
  Filled 2017-06-10: qty 2

## 2017-06-10 MED ORDER — BENRALIZUMAB 30 MG/ML ~~LOC~~ SOSY: 30.0000 mg | PREFILLED_SYRINGE | SUBCUTANEOUS | Status: DC

## 2017-06-10 MED ORDER — LACTATED RINGERS IV SOLN
INTRAVENOUS | Status: DC
  Administered 2017-06-10: 09:00:00 via INTRAVENOUS

## 2017-06-10 MED ORDER — FENTANYL CITRATE (PF) 100 MCG/2ML IJ SOLN
INTRAMUSCULAR | Status: AC
  Filled 2017-06-10: qty 2

## 2017-06-10 MED ORDER — PANTOPRAZOLE SODIUM 40 MG PO TBEC
40.0000 mg | DELAYED_RELEASE_TABLET | Freq: Every day | ORAL | Status: DC
  Administered 2017-06-11 – 2017-06-14 (×4): 40 mg via ORAL
  Filled 2017-06-10 (×4): qty 1

## 2017-06-10 MED ORDER — ACETAMINOPHEN ER 650 MG PO TBCR: 650.0000 mg | EXTENDED_RELEASE_TABLET | Freq: Three times a day (TID) | ORAL | Status: DC | PRN

## 2017-06-10 MED ORDER — MOMETASONE FURO-FORMOTEROL FUM 200-5 MCG/ACT IN AERO
2.0000 | INHALATION_SPRAY | Freq: Two times a day (BID) | RESPIRATORY_TRACT | Status: DC
  Administered 2017-06-10 – 2017-06-14 (×8): 2 via RESPIRATORY_TRACT
  Filled 2017-06-10: qty 8.8

## 2017-06-10 MED ORDER — IPRATROPIUM-ALBUTEROL 0.5-2.5 (3) MG/3ML IN SOLN
3.0000 mL | Freq: Two times a day (BID) | RESPIRATORY_TRACT | Status: DC
  Administered 2017-06-10: 3 mL via RESPIRATORY_TRACT
  Filled 2017-06-10: qty 3

## 2017-06-10 MED ORDER — SUCCINYLCHOLINE CHLORIDE 200 MG/10ML IV SOSY
PREFILLED_SYRINGE | INTRAVENOUS | Status: AC
  Filled 2017-06-10: qty 10

## 2017-06-10 MED ORDER — PROPOFOL 10 MG/ML IV BOLUS
INTRAVENOUS | Status: DC | PRN
  Administered 2017-06-10: 50 mg via INTRAVENOUS
  Administered 2017-06-10: 140 mg via INTRAVENOUS

## 2017-06-10 MED ORDER — POLYETHYLENE GLYCOL 3350 17 G PO PACK
17.0000 g | PACK | Freq: Every day | ORAL | Status: DC | PRN
  Administered 2017-06-13: 17 g via ORAL
  Filled 2017-06-10: qty 1

## 2017-06-10 MED ORDER — POTASSIUM CHLORIDE CRYS ER 10 MEQ PO TBCR
10.0000 meq | EXTENDED_RELEASE_TABLET | Freq: Every day | ORAL | Status: AC
  Administered 2017-06-11 – 2017-06-14 (×4): 10 meq via ORAL
  Filled 2017-06-10 (×6): qty 1

## 2017-06-10 MED ORDER — MENTHOL 3 MG MT LOZG: 1.0000 | LOZENGE | OROMUCOSAL | Status: DC | PRN

## 2017-06-10 MED ORDER — ONDANSETRON HCL 4 MG PO TABS: 4.0000 mg | ORAL_TABLET | Freq: Four times a day (QID) | ORAL | Status: DC | PRN

## 2017-06-10 MED ORDER — LIDOCAINE HCL (CARDIAC) 20 MG/ML IV SOLN
INTRAVENOUS | Status: AC
  Filled 2017-06-10: qty 5

## 2017-06-10 MED ORDER — PHENOL 1.4 % MT LIQD: 1.0000 | OROMUCOSAL | Status: DC | PRN

## 2017-06-10 MED ORDER — DEXAMETHASONE SODIUM PHOSPHATE 10 MG/ML IJ SOLN
INTRAMUSCULAR | Status: AC
  Filled 2017-06-10: qty 1

## 2017-06-10 MED ORDER — CLINDAMYCIN PHOSPHATE 900 MG/50ML IV SOLN
900.0000 mg | INTRAVENOUS | Status: AC
  Administered 2017-06-10: 900 mg via INTRAVENOUS
  Filled 2017-06-10: qty 50

## 2017-06-10 MED ORDER — IRBESARTAN 300 MG PO TABS
300.0000 mg | ORAL_TABLET | Freq: Every day | ORAL | Status: DC
  Administered 2017-06-10 – 2017-06-14 (×5): 300 mg via ORAL
  Filled 2017-06-10 (×5): qty 1

## 2017-06-10 MED ORDER — FERROUS SULFATE 325 (65 FE) MG PO TABS
325.0000 mg | ORAL_TABLET | Freq: Every day | ORAL | Status: DC
  Administered 2017-06-11 – 2017-06-14 (×4): 325 mg via ORAL
  Filled 2017-06-10 (×4): qty 1

## 2017-06-10 MED ORDER — FENTANYL CITRATE (PF) 100 MCG/2ML IJ SOLN
25.0000 ug | INTRAMUSCULAR | Status: DC | PRN
  Administered 2017-06-10 (×3): 50 ug via INTRAVENOUS

## 2017-06-10 MED ORDER — DEXTROSE 5 % IV SOLN
INTRAVENOUS | Status: DC | PRN
  Administered 2017-06-10: 50 ug/min via INTRAVENOUS

## 2017-06-10 MED ORDER — 0.9 % SODIUM CHLORIDE (POUR BTL) OPTIME
TOPICAL | Status: DC | PRN
  Administered 2017-06-10: 1000 mL

## 2017-06-10 MED ORDER — ALBUTEROL SULFATE (2.5 MG/3ML) 0.083% IN NEBU: 2.5000 mg | INHALATION_SOLUTION | Freq: Four times a day (QID) | RESPIRATORY_TRACT | Status: DC | PRN

## 2017-06-10 MED ORDER — FLUTICASONE PROPIONATE 50 MCG/ACT NA SUSP
1.0000 | Freq: Two times a day (BID) | NASAL | Status: DC
  Administered 2017-06-11 – 2017-06-14 (×8): 1 via NASAL
  Filled 2017-06-10 (×2): qty 16

## 2017-06-10 MED ORDER — LIDOCAINE 2% (20 MG/ML) 5 ML SYRINGE
INTRAMUSCULAR | Status: DC | PRN
  Administered 2017-06-10: 50 mg via INTRAVENOUS

## 2017-06-10 MED ORDER — FOLIC ACID 1 MG PO TABS
1.0000 mg | ORAL_TABLET | Freq: Every day | ORAL | Status: DC
  Administered 2017-06-10 – 2017-06-14 (×5): 1 mg via ORAL
  Filled 2017-06-10 (×5): qty 1

## 2017-06-10 MED ORDER — CLINDAMYCIN PHOSPHATE 600 MG/50ML IV SOLN
600.0000 mg | Freq: Four times a day (QID) | INTRAVENOUS | Status: AC
  Administered 2017-06-10 – 2017-06-11 (×3): 600 mg via INTRAVENOUS
  Filled 2017-06-10 (×3): qty 50

## 2017-06-10 MED ORDER — HYDROCODONE-ACETAMINOPHEN 5-325 MG PO TABS
1.0000 | ORAL_TABLET | ORAL | Status: DC | PRN
  Administered 2017-06-10 – 2017-06-14 (×8): 2 via ORAL
  Filled 2017-06-10 (×8): qty 2

## 2017-06-10 MED ORDER — BUPIVACAINE-EPINEPHRINE 0.25% -1:200000 IJ SOLN
INTRAMUSCULAR | Status: DC | PRN
  Administered 2017-06-10: 10 mL

## 2017-06-10 MED ORDER — HYDROCODONE-ACETAMINOPHEN 5-325 MG PO TABS: 1.0000 | ORAL_TABLET | Freq: Four times a day (QID) | ORAL | 0 refills | Status: DC | PRN

## 2017-06-10 MED ORDER — ONDANSETRON HCL 4 MG/2ML IJ SOLN
INTRAMUSCULAR | Status: AC
  Filled 2017-06-10: qty 2

## 2017-06-10 MED ORDER — DEXAMETHASONE SODIUM PHOSPHATE 4 MG/ML IJ SOLN
INTRAMUSCULAR | Status: DC | PRN
  Administered 2017-06-10: 10 mg via INTRAVENOUS

## 2017-06-10 MED ORDER — POLYETHYLENE GLYCOL 3350 17 G PO PACK: 17.0000 g | PACK | Freq: Every day | ORAL | Status: DC | PRN

## 2017-06-10 MED ORDER — ONDANSETRON HCL 4 MG/2ML IJ SOLN: 4.0000 mg | Freq: Four times a day (QID) | INTRAMUSCULAR | Status: DC | PRN

## 2017-06-10 MED ORDER — OXYCODONE HCL 5 MG PO TABS: 5.0000 mg | ORAL_TABLET | Freq: Once | ORAL | Status: DC | PRN

## 2017-06-10 MED ORDER — METHOCARBAMOL 500 MG PO TABS
500.0000 mg | ORAL_TABLET | Freq: Four times a day (QID) | ORAL | Status: DC | PRN
Start: 2017-06-10 — End: 2017-06-14
  Administered 2017-06-12 – 2017-06-14 (×4): 500 mg via ORAL
  Filled 2017-06-10 (×6): qty 1

## 2017-06-10 MED ORDER — SUCCINYLCHOLINE CHLORIDE 20 MG/ML IJ SOLN
INTRAMUSCULAR | Status: DC | PRN
  Administered 2017-06-10: 100 mg via INTRAVENOUS

## 2017-06-10 MED ORDER — DOCUSATE SODIUM 100 MG PO CAPS
100.0000 mg | ORAL_CAPSULE | Freq: Two times a day (BID) | ORAL | Status: DC
  Administered 2017-06-10 – 2017-06-14 (×8): 100 mg via ORAL
  Filled 2017-06-10 (×8): qty 1

## 2017-06-10 MED ORDER — SODIUM CHLORIDE 0.9 % IV SOLN
INTRAVENOUS | Status: DC
  Administered 2017-06-10 (×2): via INTRAVENOUS

## 2017-06-10 MED ORDER — EPINEPHRINE 0.3 MG/0.3ML IJ DEVI: 0.3000 mg | Freq: Every day | INTRAMUSCULAR | Status: DC | PRN

## 2017-06-10 MED ORDER — METHOCARBAMOL 1000 MG/10ML IJ SOLN
500.0000 mg | Freq: Four times a day (QID) | INTRAVENOUS | Status: DC | PRN
  Filled 2017-06-10: qty 5

## 2017-06-10 SURGICAL SUPPLY — 68 items
AID PSTN UNV HD RSTRNT DISP (MISCELLANEOUS)
BIT DRILL 170X2.5X (BIT) IMPLANT
BIT DRILL 5/64X5 DISP (BIT) ×3 IMPLANT
BIT DRL 170X2.5X (BIT)
BLADE SAG 18X100X1.27 (BLADE) ×3 IMPLANT
CAPT SHLDR REVTOTAL 1 ×2 IMPLANT
CLOSURE WOUND 1/2 X4 (GAUZE/BANDAGES/DRESSINGS) ×1
COVER SURGICAL LIGHT HANDLE (MISCELLANEOUS) ×3 IMPLANT
DRAPE INCISE IOBAN 66X45 STRL (DRAPES) ×1 IMPLANT
DRAPE ORTHO SPLIT 77X108 STRL (DRAPES) ×6
DRAPE SURG ORHT 6 SPLT 77X108 (DRAPES) ×2 IMPLANT
DRAPE U-SHAPE 47X51 STRL (DRAPES) ×3 IMPLANT
DRILL 2.5 (BIT)
DRSG ADAPTIC 3X8 NADH LF (GAUZE/BANDAGES/DRESSINGS) ×3 IMPLANT
DRSG PAD ABDOMINAL 8X10 ST (GAUZE/BANDAGES/DRESSINGS) ×3 IMPLANT
DURAPREP 26ML APPLICATOR (WOUND CARE) ×3 IMPLANT
ELECT BLADE 4.0 EZ CLEAN MEGAD (MISCELLANEOUS) ×3
ELECT NDL TIP 2.8 STRL (NEEDLE) ×1 IMPLANT
ELECT NEEDLE TIP 2.8 STRL (NEEDLE) ×3 IMPLANT
ELECT REM PT RETURN 9FT ADLT (ELECTROSURGICAL) ×3
ELECTRODE BLDE 4.0 EZ CLN MEGD (MISCELLANEOUS) ×1 IMPLANT
ELECTRODE REM PT RTRN 9FT ADLT (ELECTROSURGICAL) ×1 IMPLANT
GAUZE SPONGE 4X4 12PLY STRL (GAUZE/BANDAGES/DRESSINGS) ×3 IMPLANT
GAUZE SPONGE 4X4 12PLY STRL LF (GAUZE/BANDAGES/DRESSINGS) ×2 IMPLANT
GLOVE BIOGEL PI ORTHO PRO 7.5 (GLOVE) ×2
GLOVE BIOGEL PI ORTHO PRO SZ8 (GLOVE) ×2
GLOVE ORTHO TXT STRL SZ7.5 (GLOVE) ×3 IMPLANT
GLOVE PI ORTHO PRO STRL 7.5 (GLOVE) ×1 IMPLANT
GLOVE PI ORTHO PRO STRL SZ8 (GLOVE) ×1 IMPLANT
GLOVE SURG ORTHO 8.5 STRL (GLOVE) ×3 IMPLANT
GOWN STRL REUS W/ TWL LRG LVL3 (GOWN DISPOSABLE) ×1 IMPLANT
GOWN STRL REUS W/ TWL XL LVL3 (GOWN DISPOSABLE) ×2 IMPLANT
GOWN STRL REUS W/TWL LRG LVL3 (GOWN DISPOSABLE) ×3
GOWN STRL REUS W/TWL XL LVL3 (GOWN DISPOSABLE) ×6
KIT BASIN OR (CUSTOM PROCEDURE TRAY) ×3 IMPLANT
KIT TURNOVER KIT B (KITS) ×3 IMPLANT
MANIFOLD NEPTUNE II (INSTRUMENTS) ×3 IMPLANT
NDL 1/2 CIR MAYO (NEEDLE) ×1 IMPLANT
NDL HYPO 25GX1X1/2 BEV (NEEDLE) ×1 IMPLANT
NEEDLE 1/2 CIR MAYO (NEEDLE) ×3 IMPLANT
NEEDLE HYPO 25GX1X1/2 BEV (NEEDLE) ×3 IMPLANT
NS IRRIG 1000ML POUR BTL (IV SOLUTION) ×3 IMPLANT
PACK SHOULDER (CUSTOM PROCEDURE TRAY) ×3 IMPLANT
PAD ABD 8X10 STRL (GAUZE/BANDAGES/DRESSINGS) ×2 IMPLANT
PAD ARMBOARD 7.5X6 YLW CONV (MISCELLANEOUS) ×6 IMPLANT
PIN GUIDE 1.2 (PIN) IMPLANT
PIN GUIDE GLENOPHERE 1.5MX300M (PIN) IMPLANT
PIN METAGLENE 2.5 (PIN) IMPLANT
RESTRAINT HEAD UNIVERSAL NS (MISCELLANEOUS) ×1 IMPLANT
SLING ARM FOAM STRAP MED (SOFTGOODS) ×2 IMPLANT
SPONGE LAP 18X18 X RAY DECT (DISPOSABLE) IMPLANT
SPONGE LAP 4X18 X RAY DECT (DISPOSABLE) ×3 IMPLANT
STRIP CLOSURE SKIN 1/2X4 (GAUZE/BANDAGES/DRESSINGS) ×2 IMPLANT
SUCTION FRAZIER HANDLE 10FR (MISCELLANEOUS) ×2
SUCTION TUBE FRAZIER 10FR DISP (MISCELLANEOUS) ×1 IMPLANT
SUT FIBERWIRE #2 38 T-5 BLUE (SUTURE)
SUT MNCRL AB 4-0 PS2 18 (SUTURE) ×3 IMPLANT
SUT VIC AB 0 CT2 27 (SUTURE) ×3 IMPLANT
SUT VIC AB 2-0 CT1 27 (SUTURE) ×3
SUT VIC AB 2-0 CT1 TAPERPNT 27 (SUTURE) ×1 IMPLANT
SUT VICRYL 0 CT 1 36IN (SUTURE) ×3 IMPLANT
SUTURE FIBERWR #2 38 T-5 BLUE (SUTURE) IMPLANT
SYR CONTROL 10ML LL (SYRINGE) ×3 IMPLANT
TAPE CLOTH SURG 4X10 WHT LF (GAUZE/BANDAGES/DRESSINGS) ×2 IMPLANT
TOWEL OR 17X24 6PK STRL BLUE (TOWEL DISPOSABLE) ×3 IMPLANT
TOWEL OR 17X26 10 PK STRL BLUE (TOWEL DISPOSABLE) ×3 IMPLANT
TOWER CARTRIDGE SMART MIX (DISPOSABLE) IMPLANT
YANKAUER SUCT BULB TIP NO VENT (SUCTIONS) ×3 IMPLANT

## 2017-06-10 NOTE — Discharge Instructions (Signed)
Ice to the shoulder as much as possible.  Keep the arm propped across your waist so you can see your elbow - use pillows or blanket under the elbow.  Keep the incision clean and dry and covered for one week, then ok to shower and get it wet.  Ok to remove the sling while seated and ok to use for gentle ADLs and self care.  Follow up in the office in two weeks, call for appt  [phone]

## 2017-06-10 NOTE — Brief Op Note (Signed)
06/10/2017  12:22 PM  PATIENT:  Glenda Terry  77 y.o. female  PRE-OPERATIVE DIAGNOSIS:  left shoulder rotator cuff arthropathy, end stage osteoarthritis  POST-OPERATIVE DIAGNOSIS:  left shoulder rotator cuff arthropathy, end stage osteoarthritis  PROCEDURE:  Procedure(s): LEFT REVERSE SHOULDER ARTHROPLASTY (Left)  Depuy Delta Extend  SURGEON:  Surgeon(s) and Role:    Netta Cedars, MD - Primary  PHYSICIAN ASSISTANT:   ASSISTANTS: Ventura Bruns, PA-C   ANESTHESIA:   regional and general  EBL:  50 mL   BLOOD ADMINISTERED:none  DRAINS: none   LOCAL MEDICATIONS USED:  MARCAINE     SPECIMEN:  No Specimen  DISPOSITION OF SPECIMEN:  N/A  COUNTS:  YES  TOURNIQUET:  * No tourniquets in log *  DICTATION: .Other Dictation: Dictation Number [id number]  PLAN OF CARE: Admit to inpatient   PATIENT DISPOSITION:  PACU - hemodynamically stable.   Delay start of Pharmacological VTE agent (>24hrs) due to surgical blood loss or risk of bleeding: not applicable

## 2017-06-10 NOTE — Interval H&P Note (Signed)
History and Physical Interval Note:  06/10/2017 9:43 AM  Glenda Terry  has presented today for surgery, with the diagnosis of left shoulder rotator cuff arthropathy, end stage osteoarthritis  The various methods of treatment have been discussed with the patient and family. After consideration of risks, benefits and other options for treatment, the patient has consented to  Procedure(s): LEFT REVERSE SHOULDER ARTHROPLASTY (Left) as a surgical intervention .  The patient's history has been reviewed, patient examined, no change in status, stable for surgery.  I have reviewed the patient's chart and labs.  Questions were answered to the patient's satisfaction.     Yolinda Duerr,STEVEN R

## 2017-06-10 NOTE — Anesthesia Procedure Notes (Signed)
Procedure Name: Intubation Date/Time: 06/10/2017 10:15 AM Performed by: Lieutenant Diego, CRNA Pre-anesthesia Checklist: Patient identified, Emergency Drugs available, Suction available and Patient being monitored Patient Re-evaluated:Patient Re-evaluated prior to induction Oxygen Delivery Method: Circle system utilized Preoxygenation: Pre-oxygenation with 100% oxygen Induction Type: IV induction Ventilation: Mask ventilation without difficulty Laryngoscope Size: Miller and 2 Grade View: Grade I Tube type: Oral Tube size: 7.0 mm Number of attempts: 1 Airway Equipment and Method: Stylet and Oral airway Placement Confirmation: ETT inserted through vocal cords under direct vision,  positive ETCO2 and breath sounds checked- equal and bilateral Secured at: 22 cm Tube secured with: Tape Dental Injury: Teeth and Oropharynx as per pre-operative assessment

## 2017-06-10 NOTE — Anesthesia Procedure Notes (Signed)
Anesthesia Regional Block: Interscalene brachial plexus block   Pre-Anesthetic Checklist: ,, timeout performed, Correct Patient, Correct Site, Correct Laterality, Correct Procedure, Correct Position, site marked, Risks and benefits discussed,  Surgical consent,  Pre-op evaluation,  At surgeon's request and post-op pain management  Laterality: Left  Prep: Dura Prep       Needles:  Injection technique: Single-shot  Needle Type: Echogenic Stimulator Needle     Needle Length: 5cm  Needle Gauge: 22     Additional Needles:   Procedures:, nerve stimulator,,,,,,,   Nerve Stimulator or Paresthesia:  Response: biceps flexion, 0.45 mA,   Additional Responses:   Narrative:  Start time: 06/10/2017 8:52 AM End time: 06/10/2017 8:58 AM Injection made incrementally with aspirations every 5 mL.  Performed by: Personally  Anesthesiologist: Albertha Ghee, MD  Additional Notes: Functioning IV was confirmed and monitors were applied.  A 21mm 22ga Arrow echogenic stimulator needle was used. Sterile prep and drape,hand hygiene and sterile gloves were used.  Negative aspiration and negative test dose prior to incremental administration of local anesthetic. The patient tolerated the procedure well.  Ultrasound guidance: relevent anatomy identified, needle position confirmed, local anesthetic spread visualized around nerve(s), vascular puncture avoided.  Image printed for medical record.

## 2017-06-10 NOTE — Anesthesia Postprocedure Evaluation (Signed)
Anesthesia Post Note  Patient: Glenda Terry  Procedure(s) Performed: LEFT REVERSE SHOULDER ARTHROPLASTY (Left Shoulder)     Patient location during evaluation: PACU Anesthesia Type: General Level of consciousness: awake and alert and oriented Pain management: pain level controlled Vital Signs Assessment: post-procedure vital signs reviewed and stable Respiratory status: spontaneous breathing, nonlabored ventilation and respiratory function stable Cardiovascular status: blood pressure returned to baseline and stable Postop Assessment: no apparent nausea or vomiting Anesthetic complications: no    Last Vitals:  Vitals:   06/10/17 1300 06/10/17 1302  BP:  (!) 146/67  Pulse: 86 81  Resp: 20 17  Temp:    SpO2: 97% 97%    Last Pain:  Vitals:   06/10/17 1317  TempSrc:   PainSc: 3                  Charmagne Buhl A.

## 2017-06-10 NOTE — Transfer of Care (Signed)
Immediate Anesthesia Transfer of Care Note  Patient: Glenda Terry  Procedure(s) Performed: LEFT REVERSE SHOULDER ARTHROPLASTY (Left Shoulder)  Patient Location: PACU  Anesthesia Type:General  Level of Consciousness: awake  Airway & Oxygen Therapy: Patient Spontanous Breathing and Patient connected to nasal cannula oxygen  Post-op Assessment: Report given to RN and Post -op Vital signs reviewed and stable  Post vital signs: Reviewed and stable  Last Vitals:  Vitals Value Taken Time  BP    Temp    Pulse 71 06/10/2017  1:28 PM  Resp 14 06/10/2017  1:28 PM  SpO2 99 % 06/10/2017  1:28 PM  Vitals shown include unvalidated device data.  Last Pain:  Vitals:   06/10/17 1317  TempSrc:   PainSc: 3          Complications: No apparent anesthesia complications

## 2017-06-11 LAB — HEMOGLOBIN AND HEMATOCRIT, BLOOD
HCT: 34.2 % — ABNORMAL LOW (ref 36.0–46.0)
Hemoglobin: 10.1 g/dL — ABNORMAL LOW (ref 12.0–15.0)

## 2017-06-11 LAB — BASIC METABOLIC PANEL
ANION GAP: 11 (ref 5–15)
BUN: 15 mg/dL (ref 6–20)
CHLORIDE: 101 mmol/L (ref 101–111)
CO2: 23 mmol/L (ref 22–32)
Calcium: 9.3 mg/dL (ref 8.9–10.3)
Creatinine, Ser: 1.28 mg/dL — ABNORMAL HIGH (ref 0.44–1.00)
GFR calc non Af Amer: 40 mL/min — ABNORMAL LOW (ref >60)
GFR, EST AFRICAN AMERICAN: 47 mL/min — AB (ref >60)
GLUCOSE: 171 mg/dL — AB (ref 65–99)
POTASSIUM: 4 mmol/L (ref 3.5–5.1)
Sodium: 135 mmol/L (ref 135–145)

## 2017-06-11 MED ORDER — IPRATROPIUM-ALBUTEROL 0.5-2.5 (3) MG/3ML IN SOLN
3.0000 mL | Freq: Two times a day (BID) | RESPIRATORY_TRACT | Status: DC
  Administered 2017-06-11 – 2017-06-14 (×6): 3 mL via RESPIRATORY_TRACT
  Filled 2017-06-11 (×6): qty 3

## 2017-06-11 MED ORDER — IPRATROPIUM-ALBUTEROL 0.5-2.5 (3) MG/3ML IN SOLN
3.0000 mL | Freq: Three times a day (TID) | RESPIRATORY_TRACT | Status: DC
  Administered 2017-06-11: 3 mL via RESPIRATORY_TRACT
  Filled 2017-06-11: qty 3

## 2017-06-11 NOTE — Progress Notes (Signed)
Orthopedics Progress Note  Subjective: Patient with minimal pain this morning as block still working  Objective:  Vitals:   06/11/17 0922 06/11/17 0924  BP:    Pulse:    Resp:    Temp:    SpO2: 100% 100%    General: Awake and alert  Musculoskeletal: Left shoulder dressing CDI, wiggles fingers well Neurovascularly intact  Lab Results  Component Value Date   WBC 9.2 06/03/2017   HGB 10.1 (L) 06/11/2017   HCT 34.2 (L) 06/11/2017   MCV 79.3 06/03/2017   PLT 295 06/03/2017       Component Value Date/Time   NA 135 06/11/2017 0623   NA 135 (A) 05/18/2012   K 4.0 06/11/2017 0623   K 3.6 05/18/2012   CL 101 06/11/2017 0623   CO2 23 06/11/2017 0623   GLUCOSE 171 (H) 06/11/2017 0623   BUN 15 06/11/2017 0623   BUN 7 05/18/2012   CREATININE 1.28 (H) 06/11/2017 0623   CREATININE 0.93 07/04/2012 0900   CALCIUM 9.3 06/11/2017 0623   GFRNONAA 40 (L) 06/11/2017 0623   GFRAA 47 (L) 06/11/2017 0623    Lab Results  Component Value Date   INR 0.98 02/08/2017   INR 0.94 04/11/2012   INR 0.90 01/19/2012    Assessment/Plan: POD #1 s/p Procedure(s): LEFT REVERSE SHOULDER ARTHROPLASTY Pt, OT consults [place]-2 pending and plan is for SNF on Monday Patient and family requesting [facility] R. Veverly Fells, MD 06/11/2017 9:34 AM

## 2017-06-11 NOTE — Evaluation (Addendum)
Occupational Therapy Evaluation Patient Details Name: Glenda Terry MRN: [id number] DOB: 1940/03/24 Today's Date: 06/11/2017    History of Present Illness Pt is a 77 y.o. female s/p L reverse total shoulder arthroplasty. PMH significant for anxiety, back pain, CKD, Cushing's syndrome, essential hypertension, GERD, HOH inflammatory arthritis, iron deficiency anemia, pulmonary eosinophilia, spinal stenosis, tubular adenoma. She has a history of R reverse total shoulder arthroplasty.   Clinical Impression   PTA, pt was independent with cane for ADL and functional mobility. She currently requires max assist for UB ADL, mod assist for LB ADL, and min assist for toilet transfers. Pt education initiated concerning reverse total shoulder active protocol including HEP and compensatory ADL strategies with handouts provided. Pt would benefit from continued OT services while admitted to improve independence and safety with ADL andf functional mobility. She lives alone and demonstrates significant decline from PLOF. Thus, feel short-term SNF is an appropriate D/C recommendation. Will continue to follow while admitted.    Follow Up Recommendations  Follow surgeon's recommendation for DC plan and follow-up therapies;Supervision/Assistance - 24 hour;SNF    Equipment Recommendations  Other (comment)(defer at next venue of care)    Recommendations for Other Services PT consult     Precautions / Restrictions Precautions Precautions: Shoulder Type of Shoulder Precautions: Active protocol: NWB LUE, AROM elbow/wrist/hand to tolerance, AROM/PROM L shoulder forward flexion (0-90), abduction (0-60), external rotation (0-30); "OK for ADL and self-care" Shoulder Interventions: Shoulder sling/immobilizer;For comfort(for comfort and sleep) Precaution Booklet Issued: Yes (comment) Precaution Comments: Reviewed precautions in detail Required Braces or Orthoses: Sling(L shoulder) Restrictions Weight Bearing  Restrictions: Yes LUE Weight Bearing: Non weight bearing      Mobility Bed Mobility Overal bed mobility: Needs Assistance Bed Mobility: Supine to Sit     Supine to sit: Min guard     General bed mobility comments: Min guard assist for safety. Significantly increased time.   Transfers Overall transfer level: Needs assistance Equipment used: 1 person hand held assist Transfers: Sit to/from Stand Sit to Stand: Min assist         General transfer comment: Min assist to steady and power up to standing.     Balance Overall balance assessment: Needs assistance Sitting-balance support: No upper extremity supported;Feet supported Sitting balance-Leahy Scale: Fair     Standing balance support: Single extremity supported;During functional activity;No upper extremity supported Standing balance-Leahy Scale: Fair Standing balance comment: Able to statically stand without UE support but requires support for dynamic tasks.                            ADL either performed or assessed with clinical judgement   ADL Overall ADL's : Needs assistance/impaired Eating/Feeding: Set up;Sitting   Grooming: Minimal assistance;Sitting   Upper Body Bathing: Maximal assistance;Sitting   Lower Body Bathing: Moderate assistance;Sit to/from stand   Upper Body Dressing : Maximal assistance;Sitting   Lower Body Dressing: Moderate assistance;Sit to/from stand   Toilet Transfer: Minimal assistance;Ambulation Toilet Transfer Details (indicate cue type and reason): Single handheld assist Toileting- Clothing Manipulation and Hygiene: Sit to/from stand;Moderate assistance       Functional mobility during ADLs: Minimal assistance General ADL Comments: Pt unstable on her feet. She has been used to using her cane in her L hand and this makes cane use in R hand difficult.      Vision Baseline Vision/History: Wears glasses Wears Glasses: At all times(not present during eval) Patient  Visual Report: No change  from baseline Vision Assessment?: No apparent visual deficits     Perception     Praxis      Pertinent Vitals/Pain Pain Assessment: No/denies pain     Hand Dominance Right   Extremity/Trunk Assessment Upper Extremity Assessment Upper Extremity Assessment: LUE deficits/detail LUE Deficits / Details: Decreased sensation in radial nerve distrubution. Decreased active elbow flexion. Minimal post-operative pain.  LUE Sensation: decreased light touch(thumb and first 2 digits)   Lower Extremity Assessment Lower Extremity Assessment: Generalized weakness       Communication Communication Communication: No difficulties   Cognition Arousal/Alertness: Awake/alert Behavior During Therapy: WFL for tasks assessed/performed Overall Cognitive Status: Within Functional Limits for tasks assessed                                     General Comments       Exercises Exercises: Shoulder Shoulder Exercises Shoulder Flexion: PROM;AAROM;Left;10 reps;Supine(0-90 only ) Shoulder ABduction: PROM;AAROM;Left;10 reps;Seated(0-60 only) Shoulder External Rotation: PROM;AAROM;10 reps;Left;Seated(0-30 only) Elbow Flexion: AAROM;Left;10 reps;Seated(unable to complete without assistance from therapist) Elbow Extension: AAROM;Left;10 reps;Seated Wrist Flexion: Left;10 reps;Seated;AROM Wrist Extension: Left;AAROM;10 reps;Seated Digit Composite Flexion: AROM;Left;10 reps;Seated Composite Extension: AROM;Left;10 reps;Seated   Shoulder Instructions Shoulder Instructions Donning/doffing shirt without moving shoulder: Maximal assistance Method for sponge bathing under operated UE: Maximal assistance Donning/doffing sling/immobilizer: Maximal assistance Correct positioning of sling/immobilizer: Minimal assistance ROM for elbow, wrist and digits of operated UE: Supervision/safety Sling wearing schedule (on at all times/off for ADL's): Supervision/safety Proper  positioning of operated UE when showering: Minimal assistance Positioning of UE while sleeping: Minimal assistance    Home Living Family/patient expects to be discharged to:: Skilled nursing facility Living Arrangements: Alone Available Help at Discharge: Family;Available PRN/intermittently Type of Home: House Home Access: Stairs to enter     Home Layout: One level     Bathroom Shower/Tub: Teacher, early years/pre: Handicapped height     Home Equipment: Marine scientist - single point          Prior Functioning/Environment Level of Independence: Independent with assistive device(s)        Comments: Uses cane and does her own self-care and IADL.         OT Problem List: Decreased strength;Decreased range of motion;Decreased activity tolerance;Impaired balance (sitting and/or standing);Decreased safety awareness;Decreased knowledge of use of DME or AE;Decreased knowledge of precautions;Pain;Impaired UE functional use      OT Treatment/Interventions: Self-care/ADL training;Therapeutic exercise;Neuromuscular education;Energy conservation;Therapeutic activities;Cognitive remediation/compensation;Visual/perceptual remediation/compensation;DME and/or AE instruction;Patient/family education;Balance training    OT Goals(Current goals can be found in the care plan section) Acute Rehab OT Goals Patient Stated Goal: go to rehab and get better before home OT Goal Formulation: With patient Time For Goal Achievement: 06/25/17 Potential to Achieve Goals: Good ADL Goals Pt Will Perform Grooming: with modified independence;sitting Pt Will Perform Upper Body Dressing: with min assist;sitting Pt Will Perform Lower Body Dressing: with min assist;sit to/from stand Pt Will Transfer to Toilet: with supervision;ambulating;regular height toilet(with cane) Pt Will Perform Toileting - Clothing Manipulation and hygiene: with min assist;sit to/from stand Pt/caregiver will Perform Home  Exercise Program: Left upper extremity;With written HEP provided(L UE exercises per shoulder protocol)  OT Frequency: Min 3X/week   Barriers to D/C:            Co-evaluation              AM-PAC PT "6 Clicks" Daily Activity  Outcome Measure Help from another person eating meals?: A Little Help from another person taking care of personal grooming?: A Little Help from another person toileting, which includes using toliet, bedpan, or urinal?: A Lot Help from another person bathing (including washing, rinsing, drying)?: A Lot Help from another person to put on and taking off regular upper body clothing?: A Lot Help from another person to put on and taking off regular lower body clothing?: A Lot 6 Click Score: 14   End of Session Equipment Utilized During Treatment: Other (comment)(L shoulder sling) Nurse Communication: Mobility status(recommend PT consult)  Activity Tolerance: Patient tolerated treatment well Patient left: in chair;with call bell/phone within reach  OT Visit Diagnosis: Other abnormalities of gait and mobility (R26.89);Pain Pain - Right/Left: Left Pain - part of body: Shoulder                Time: 7282-0601 OT Time Calculation (min): 50 min Charges:  OT General Charges $OT Visit: 1 Visit OT Evaluation $OT Eval Moderate Complexity: 1 Mod OT Treatments $Self Care/Home Management : 23-37 mins G-Codes:     Norman Herrlich, MS OTR/L  Pager: Pandora A Sarrinah Gardin 06/11/2017, 11:14 AM

## 2017-06-12 NOTE — Progress Notes (Signed)
Occupational Therapy Treatment Patient Details Name: Glenda Terry MRN: [id number] DOB: 04-Nov-1940 Today's Date: 06/12/2017    History of present illness Pt is a 77 y.o. female s/p L reverse total shoulder arthroplasty. PMH significant for anxiety, back pain, CKD, Cushing's syndrome, essential hypertension, GERD, HOH inflammatory arthritis, iron deficiency anemia, pulmonary eosinophilia, spinal stenosis, tubular adenoma. She has a history of R reverse total shoulder arthroplasty.   OT comments  Pt demonstrating progress toward OT goals this session. She does report nausea overnight. She was able to complete active protocol HEP with min assist and cues for proper positioning during ROM activities today in preparation for improved functional use of L UE. Pt demonstrates improved ability to don/doff sling today requiring mod assist. She continues to demonstrate instability on her feet and require assistance for all ADL. D/C recommendation remains appropriate.    Follow Up Recommendations  Follow surgeon's recommendation for DC plan and follow-up therapies;Supervision/Assistance - 24 hour;SNF    Equipment Recommendations  Other (comment)(defer to next venue of care)    Recommendations for Other Services      Precautions / Restrictions Precautions Precautions: Shoulder Type of Shoulder Precautions: Active protocol: NWB LUE, AROM elbow/wrist/hand to tolerance, AROM/PROM L shoulder forward flexion (0-90), abduction (0-60), external rotation (0-30); "OK for ADL and self-care" Shoulder Interventions: Shoulder sling/immobilizer;For comfort(for comfort and sleep) Precaution Booklet Issued: Yes (comment) Precaution Comments: Reviewed precautions in detail Required Braces or Orthoses: Sling(L shoulder) Restrictions Weight Bearing Restrictions: Yes LUE Weight Bearing: Non weight bearing       Mobility Bed Mobility               General bed mobility comments: OOB in recliner on my  arrival.   Transfers Overall transfer level: Needs assistance Equipment used: 1 person hand held assist Transfers: Sit to/from Stand Sit to Stand: Min assist         General transfer comment: Min assist to steady and power up to standing.     Balance Overall balance assessment: Needs assistance Sitting-balance support: No upper extremity supported;Feet supported Sitting balance-Leahy Scale: Fair     Standing balance support: Single extremity supported;During functional activity;No upper extremity supported Standing balance-Leahy Scale: Fair Standing balance comment: Able to statically stand without UE support but requires support for dynamic tasks.                            ADL either performed or assessed with clinical judgement   ADL Overall ADL's : Needs assistance/impaired         Upper Body Bathing: Minimal assistance;Sitting       Upper Body Dressing : Moderate assistance;Sitting Upper Body Dressing Details (indicate cue type and reason): For donning/doffing sling                 Functional mobility during ADLs: Minimal assistance General ADL Comments: Session focused on review of precautions as well as HEP education. Pt able to verbalize all precautions and activity modifications recommended during previous session.      Vision   Vision Assessment?: No apparent visual deficits   Perception     Praxis      Cognition Arousal/Alertness: Awake/alert Behavior During Therapy: WFL for tasks assessed/performed Overall Cognitive Status: Within Functional Limits for tasks assessed  Exercises Exercises: Shoulder Shoulder Exercises Shoulder Flexion: AAROM;Left;10 reps;Supine(able to achieve ~0-45) Shoulder ABduction: AAROM;Left;10 reps;Seated(0-60 only) Shoulder External Rotation: AAROM;10 reps;Left;Seated(0-30 only; cues for positioning) Elbow Flexion: AAROM;Left;10  reps;Seated(minimal support required and better able to control today) Elbow Extension: AAROM;Left;10 reps;Seated Wrist Flexion: Left;10 reps;Seated;AROM Wrist Extension: Left;AAROM;10 reps;Seated Digit Composite Flexion: AROM;Left;10 reps;Seated Composite Extension: AROM;Left;10 reps;Seated   Shoulder Instructions Shoulder Instructions Donning/doffing shirt without moving shoulder: Moderate assistance Method for sponge bathing under operated UE: Minimal assistance Donning/doffing sling/immobilizer: Moderate assistance Correct positioning of sling/immobilizer: Minimal assistance ROM for elbow, wrist and digits of operated UE: Supervision/safety Sling wearing schedule (on at all times/off for ADL's): Supervision/safety Proper positioning of operated UE when showering: Minimal assistance Positioning of UE while sleeping: Minimal assistance     General Comments      Pertinent Vitals/ Pain       Pain Assessment: Faces Faces Pain Scale: Hurts even more Pain Location: L shoulder wtih exercises Pain Descriptors / Indicators: Aching;Discomfort;Operative site guarding Pain Intervention(s): Limited activity within patient's tolerance;Monitored during session  Home Living                                          Prior Functioning/Environment              Frequency  Min 3X/week        Progress Toward Goals  OT Goals(current goals can now be found in the care plan section)  Progress towards OT goals: Progressing toward goals  Acute Rehab OT Goals Patient Stated Goal: go to rehab and get better before home OT Goal Formulation: With patient Time For Goal Achievement: 06/25/17 Potential to Achieve Goals: Good  Plan Discharge plan remains appropriate    Co-evaluation                 AM-PAC PT "6 Clicks" Daily Activity     Outcome Measure   Help from another person eating meals?: A Little Help from another person taking care of personal grooming?:  A Little Help from another person toileting, which includes using toliet, bedpan, or urinal?: A Lot Help from another person bathing (including washing, rinsing, drying)?: A Lot Help from another person to put on and taking off regular upper body clothing?: A Lot Help from another person to put on and taking off regular lower body clothing?: A Lot 6 Click Score: 14    End of Session Equipment Utilized During Treatment: Other (comment)(L shoulder sling)  OT Visit Diagnosis: Other abnormalities of gait and mobility (R26.89);Pain Pain - Right/Left: Left Pain - part of body: Shoulder   Activity Tolerance Patient tolerated treatment well   Patient Left in chair;with call bell/phone within reach   Nurse Communication          Time: 1610-9604 OT Time Calculation (min): 20 min  Charges: OT General Charges $OT Visit: 1 Visit OT Treatments $Therapeutic Exercise: 8-22 mins  Norman Herrlich, MS OTR/L  Pager: Kistler A Masen Salvas 06/12/2017, 10:21 AM

## 2017-06-12 NOTE — Evaluation (Signed)
Physical Therapy Evaluation Patient Details Name: Glenda Terry MRN: [id number] DOB: 09-08-1940 Today's Date: 06/12/2017   History of Present Illness  Pt is a 77 y.o. female s/p L reverse total shoulder arthroplasty. PMH significant for anxiety, back pain, CKD, Cushing's syndrome, essential hypertension, GERD, HOH inflammatory arthritis, iron deficiency anemia, pulmonary eosinophilia, spinal stenosis, tubular adenoma. She has a history of R reverse total shoulder arthroplasty.  Clinical Impression  Patient ambulated 20' slowly but required guarding. She lives alone. She would benefit from rehab at a SNF to improve her strength and safety. She is very motivated. Acute therapy will continue to work with the patient while she is in the hospital.     Follow Up Recommendations SNF    Equipment Recommendations       Recommendations for Other Services       Precautions / Restrictions Precautions Precautions: Shoulder Type of Shoulder Precautions: Active protocol: NWB LUE, AROM elbow/wrist/hand to tolerance, AROM/PROM L shoulder forward flexion (0-90), abduction (0-60), external rotation (0-30); "OK for ADL and self-care" Shoulder Interventions: Shoulder sling/immobilizer;For comfort(for comfort and sleep) Precaution Booklet Issued: Yes (comment) Precaution Comments: Reviewed precautions in detail Required Braces or Orthoses: Sling(L shoulder) Restrictions Weight Bearing Restrictions: Yes LUE Weight Bearing: Non weight bearing      Mobility  Bed Mobility Overal bed mobility: Needs Assistance Bed Mobility: Supine to Sit     Supine to sit: Min guard     General bed mobility comments: Able to use her R UE to assist herself to the edge of the bed   Transfers Overall transfer level: Needs assistance Equipment used: Straight cane(small base attachement ) Transfers: Sit to/from Stand Sit to Stand: Min assist         General transfer comment: Min a for balance and strength.    Ambulation/Gait Ambulation/Gait assistance: Min guard Ambulation Distance (Feet): 20 Feet Assistive device: Straight cane       General Gait Details: Slow gait pattern. Min guard for balance and safety   Stairs            Wheelchair Mobility    Modified Rankin (Stroke Patients Only)       Balance Overall balance assessment: Needs assistance Sitting-balance support: No upper extremity supported;Feet supported Sitting balance-Leahy Scale: Fair     Standing balance support: Single extremity supported;During functional activity;No upper extremity supported Standing balance-Leahy Scale: Fair Standing balance comment: used cane for balance                              Pertinent Vitals/Pain Pain Assessment: Faces Faces Pain Scale: Hurts even more Pain Location: L shoulder wtih exercises Pain Descriptors / Indicators: Aching;Discomfort;Operative site guarding Pain Intervention(s): Limited activity within patient's tolerance    Home Living Family/patient expects to be discharged to:: Skilled nursing facility Living Arrangements: Alone Available Help at Discharge: Family;Available PRN/intermittently Type of Home: House Home Access: Stairs to enter     Home Layout: One level Home Equipment: Shower seat;Cane - single point      Prior Function                 Hand Dominance        Extremity/Trunk Assessment        Lower Extremity Assessment Lower Extremity Assessment: Generalized weakness       Communication      Cognition Arousal/Alertness: Awake/alert Behavior During Therapy: WFL for tasks assessed/performed Overall Cognitive Status: Within Functional Limits for  tasks assessed                                        General Comments      Exercises Shoulder Exercises Shoulder Flexion: AAROM;Left;10 reps;Supine(able to achieve ~0-45) Shoulder ABduction: AAROM;Left;10 reps;Seated(0-60 only) Shoulder External  Rotation: AAROM;10 reps;Left;Seated(0-30 only; cues for positioning) Elbow Flexion: AAROM;Left;10 reps;Seated(minimal support required and better able to control today) Elbow Extension: AAROM;Left;10 reps;Seated Wrist Flexion: Left;10 reps;Seated;AROM Wrist Extension: Left;AAROM;10 reps;Seated Digit Composite Flexion: AROM;Left;10 reps;Seated Composite Extension: AROM;Left;10 reps;Seated Donning/doffing shirt without moving shoulder: Moderate assistance Method for sponge bathing under operated UE: Minimal assistance Donning/doffing sling/immobilizer: Moderate assistance Correct positioning of sling/immobilizer: Minimal assistance ROM for elbow, wrist and digits of operated UE: Supervision/safety Sling wearing schedule (on at all times/off for ADL's): Supervision/safety Proper positioning of operated UE when showering: Minimal assistance Positioning of UE while sleeping: Minimal assistance   Assessment/Plan    PT Assessment Patient needs continued PT services  PT Problem List Decreased strength;Decreased range of motion;Decreased activity tolerance;Decreased balance;Decreased mobility;Pain       PT Treatment Interventions DME instruction;Gait training;Functional mobility training;Therapeutic exercise;Therapeutic activities;Balance training;Patient/family education    PT Goals (Current goals can be found in the Care Plan section)  Acute Rehab PT Goals Patient Stated Goal: go to rehab and get better before home PT Goal Formulation: With patient Time For Goal Achievement: 09/04/17 Potential to Achieve Goals: Good    Frequency Min 4X/week   Barriers to discharge Decreased caregiver support has noone at home to help her     Co-evaluation               AM-PAC PT "6 Clicks" Daily Activity  Outcome Measure Difficulty turning over in bed (including adjusting bedclothes, sheets and blankets)?: None Difficulty moving from lying on back to sitting on the side of the bed? :  None Difficulty sitting down on and standing up from a chair with arms (e.g., wheelchair, bedside commode, etc,.)?: A Little Help needed moving to and from a bed to chair (including a wheelchair)?: A Little Help needed walking in hospital room?: A Little Help needed climbing 3-5 steps with a railing? : A Lot 6 Click Score: 19    End of Session Equipment Utilized During Treatment: Gait belt Activity Tolerance: Patient limited by fatigue Patient left: in bed;with call bell/phone within reach;with bed alarm set(patient had just got back to bed from being up ) Nurse Communication: Mobility status PT Visit Diagnosis: Unsteadiness on feet (R26.81);Muscle weakness (generalized) (M62.81);Difficulty in walking, not elsewhere classified (R26.2)    Time: 9798-9211 PT Time Calculation (min) (ACUTE ONLY): 19 min   Charges:   PT Evaluation $PT Eval Moderate Complexity: 1 Mod     PT G Codes:          Carney Living PT DPT  06/12/2017, 1:00 PM

## 2017-06-12 NOTE — Progress Notes (Signed)
Subjective: 2 Days Post-Op Procedure(s) (LRB): LEFT REVERSE SHOULDER ARTHROPLASTY (Left) Patient reports pain as mild.  No f/c.  Tolerating regular diet.  Mild nausea.  Objective: Vital signs in last 24 hours: Temp:  [98 F (36.7 C)-98.5 F (36.9 C)] 98 F (36.7 C) (03/31 0652) Pulse Rate:  [88-98] 98 (03/31 0652) Resp:  [18-20] 18 (03/31 0652) BP: (138-145)/(73-81) 138/81 (03/31 0652) SpO2:  [95 %-99 %] 98 % (03/31 0652)  Intake/Output from previous day: 03/30 0701 - 03/31 0700 In: 1160 [P.O.:1160] Out: -  Intake/Output this shift: No intake/output data recorded.  Recent Labs    06/11/17 0623  HGB 10.1*   Recent Labs    06/11/17 0623  HCT 34.2*   Recent Labs    06/11/17 0623  NA 135  K 4.0  CL 101  CO2 23  BUN 15  CREATININE 1.28*  GLUCOSE 171*  CALCIUM 9.3   No results for input(s): LABPT, INR in the last 72 hours.  PE:  incision dressed and dry.  NVI at L UE.    Assessment/Plan: 2 Days Post-Op Procedure(s) (LRB): LEFT REVERSE SHOULDER ARTHROPLASTY (Left) Discharge to SNF  Continue PT, OT.    Symone Cornman 06/12/2017, 10:11 AM

## 2017-06-13 ENCOUNTER — Other Ambulatory Visit: Payer: Self-pay | Admitting: *Deleted

## 2017-06-13 ENCOUNTER — Encounter (HOSPITAL_COMMUNITY): Payer: Self-pay | Admitting: Orthopedic Surgery

## 2017-06-13 MED ORDER — POTASSIUM CHLORIDE CRYS ER 10 MEQ PO TBCR: 10.0000 meq | EXTENDED_RELEASE_TABLET | Freq: Every day | ORAL | Status: DC

## 2017-06-13 MED ORDER — TORSEMIDE 20 MG PO TABS: 10.0000 mg | ORAL_TABLET | Freq: Every day | ORAL | Status: DC

## 2017-06-13 MED ORDER — DIPHENHYDRAMINE HCL 25 MG PO CAPS
25.0000 mg | ORAL_CAPSULE | Freq: Four times a day (QID) | ORAL | Status: DC | PRN
  Administered 2017-06-13: 25 mg via ORAL
  Filled 2017-06-13: qty 1

## 2017-06-13 NOTE — Op Note (Signed)
NAME:  Glenda Terry, Glenda Terry                ACCOUNT NO.:  MEDICAL RECORD NO.:  [id number]  LOCATION:                                 FACILITY:  PHYSICIAN:  Doran Heater. Veverly Fells, M.D. 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  PROCEDURE PERFORMED:  Left reverse total shoulder arthroplasty using DePuy Delta Xtend prosthesis.  SURGEON:  Doran Heater. Veverly Fells, M.D..  ASSISTANT:  Abbott Pao. Dixon, PA-C, who was scrubbed during the entire procedure and necessary for satisfactory completion of surgery.  ANESTHESIA:  General anesthesia was used plus interscalene block.  ESTIMATED BLOOD LOSS:  150 mL.  FLUID REPLACEMENT:  1500 mL crystalloid.  INSTRUMENT COUNTS:  Correct.  COMPLICATIONS:  There were no complications.  ANTIBIOTICS:  Perioperative antibiotics were given.  INDICATIONS:  The patient is a 77 year old female with worsening left shoulder pain secondary to rotator cuff tear arthropathy and progressive arthritis.  The patient has had failure of conservative management over an extended period of time and desires reverse shoulder replacement to restore fixed fulcrum mechanics and stability to her shoulder as well as eliminate pain and restore function.  Informed consent was obtained.  DESCRIPTION OF PROCEDURE:  After an adequate level of anesthesia was achieved, the patient was positioned in modified beach-chair position. The left shoulder was correctly identified, sterilely prepped and draped in the usual manner.  A time-out called.  We entered the shoulder using a standard deltopectoral incision starting at the coracoid process and extending down to the anterior humerus.  Dissection down through the subcutaneous tissues  using Bovie electrocautery identified the cephalic vein, took it laterally to the deltoid, pectoralis taken medially. Conjoined tendon identified and retracted medially.  The biceps had previously ruptured.  We identified the remnant of the subscap, which was really almost not there at all, just a little bit of some pseudocapsule.  We tagged that with the FiberWire figure-of-eight to retract and protect the axillary nerve, which was easily palpable.  Once we externally rotated the humerus, we progressively released the capsule off the inferior humeral neck.  We then had good exposure of the humeral head.  We extended the shoulder and delivered the head out of the wound. We entered the proximal humerus with a 6 mm reamer, reamed up to a size 10 mm.  Once we had the canal reamed to a 10 mm diameter, we placed our intramedullary resection guide which was 10 mm and then set that at 10 degrees of retroversion.  We resected with an oscillating saw, removed excess osteophytes with a rongeur, and then we subluxed the humerus posteriorly.  We did a 360-degree capsule labral excision, getting good exposure of the glenoid.  There was a little bit of cartilage on the inferior part of the glenoid, none at the superior portion and none on the humeral head.  We then removed that remaining cartilage and labrum, placed our central guide pin, reamed for the metaglene base plate, and then drilled  out our central peg hole.  We then placed the base plate with the orientation with the 12 o'clock and 6 o'clock positions, identified our locking screw holes adjacent to that.  We placed our 48 locked inferiorly, a 30 locked at the base of the coracoid, and the glenoid was so small that we could not place anterior or posterior screws.  We had good base plate fixation and selected a 38 standard glenosphere and impacted that and screwed that into position.  Once that was stable, I did a finger sweep around the glenoid  to make sure there was no incarcerated soft tissue.  We then prepared the humeral side with the metaphyseal reamer reaming for the epi 1 left and set on the 0 setting and placed in 10 degrees of retroversion.  We impacted the trial humeral prosthesis and then selected a 38, +3 poly and inserted that on the humeral tray laterally and reduced the shoulder.  We had a nice pop there with some good tension in it.  The conjoined tendon was tensioned appropriately with negative sulcus and negative gapping with external rotation.  We removed all trial components from the lateral side, irrigated thoroughly, and selected the HA press-fit stem, 10 body with a size 1 left metaphysis set on the 0 setting and placed in 10 degrees of retroversion.  We used impaction grafting with available bone graft from the humeral head and impacted that in position.  We selected the real 38, +3 poly, impacted that in place and then reduced the shoulder again nice and stable and no impingement.  We irrigated thoroughly, repaired the deltopectoral interval with 0 Vicryl suture followed by 2-0 Vicryl for subcutaneous closure and 4-0 Monocryl for skin.  Steri-Strips applied followed by a sterile dressing.  The patient tolerated the surgery well.     Doran Heater. Veverly Fells, M.D.     SRN/MEDQ  D:  06/10/2017  T:  06/10/2017  Job:  [id number]

## 2017-06-13 NOTE — Consult Note (Signed)
   [facility] CM Inpatient Consult   06/13/2017  Glenda Terry 25-Nov-1940 [id number]   Spoke with [name] at bedside. She is awaiting discharge to [facility]. Made her aware that writer will make referral for [facility] LCSW for follow up while at [facility].   Left contact information at bedside. Expresses appreciation of visit.   Spoke with inpatient LCSW who is awaiting insurance auth for SNF placement.  Will make referral to [facility].  Marthenia Rolling, MSN-Ed, RN,BSN [facility] Liaison [phone]

## 2017-06-13 NOTE — Social Work (Addendum)
CSW waiting on [employer].  CSW will continue to follow for disposition.  5:18pm: Civil [employer] fast streamer is pending.  Pt will discharge to SNF in the morning as Insurance Auth should be approved at that time. CSW advised clinical staff.  Elissa Hefty, LCSW Clinical Social Worker [phone]

## 2017-06-13 NOTE — Consult Note (Addendum)
   [facility] CM Inpatient Consult   06/13/2017  AYSA LARIVEE [birth year] [phone]    Glenda Terry is active with [place] Management program. She has been followed by [facility]. Please see chart review tab then encounters for patient outreach details.   Chart reviewed. Noted discharge plan is for SNF. Will make referral for [facility] LCSW once disposition facility is known.   [facility] RNCM was already aware of hospital admission.  Notification sent to inpatient RNCM to make aware [facility] is active.   Marthenia Rolling, MSN-Ed, RN,BSN [facility] Liaison [phone]

## 2017-06-13 NOTE — Progress Notes (Signed)
Orthopedics Progress Note  Subjective: Stable overnight, no new complaints, shoulder feeling better.  Objective:  Vitals:   06/12/17 2044 06/13/17 0506  BP: 133/66 115/60  Pulse: (!) 110 100  Resp: 17 16  Temp: 98.6 F (37 C) 98.4 F (36.9 C)  SpO2: 95% 96%    General: Awake and alert  Musculoskeletal: left shoulder wound CDI, dressing changed, moderate swelling Neurovascularly intact  Lab Results  Component Value Date   WBC 9.2 06/03/2017   HGB 10.1 (L) 06/11/2017   HCT 34.2 (L) 06/11/2017   MCV 79.3 06/03/2017   PLT 295 06/03/2017       Component Value Date/Time   NA 135 06/11/2017 0623   NA 135 (A) 05/18/2012   K 4.0 06/11/2017 0623   K 3.6 05/18/2012   CL 101 06/11/2017 0623   CO2 23 06/11/2017 0623   GLUCOSE 171 (H) 06/11/2017 0623   BUN 15 06/11/2017 0623   BUN 7 05/18/2012   CREATININE 1.28 (H) 06/11/2017 0623   CREATININE 0.93 07/04/2012 0900   CALCIUM 9.3 06/11/2017 0623   GFRNONAA 40 (L) 06/11/2017 0623   GFRAA 47 (L) 06/11/2017 0623    Lab Results  Component Value Date   INR 0.98 02/08/2017   INR 0.94 04/11/2012   INR 0.90 01/19/2012    Assessment/Plan: POD #3 s/p Procedure(s): LEFT REVERSE SHOULDER ARTHROPLASTY Doing well so far, plan discharge to [facility] today Follow up in the office in two weeks  Doran Heater. Veverly Fells, MD 06/13/2017 7:15 AM

## 2017-06-13 NOTE — Social Work (Signed)
CSW contacted SNF's of [facility] to see if they can offer a SNF bed. CSW left message for both SNF's. CSW will continue to follow up as Insurance Auth will need to be initiated.  Elissa Hefty, LCSW Clinical Social Worker [phone]

## 2017-06-13 NOTE — Discharge Summary (Addendum)
Orthopedic Discharge Summary        Physician Discharge Summary  Patient ID: Glenda Terry MRN: [id number] DOB/AGE: August 19, 1940 77 y.o.  Admit date: 06/10/2017 Discharge date: 06/14/2017  Procedures:  Procedure(s) (LRB): LEFT REVERSE SHOULDER ARTHROPLASTY (Left)  Attending Physician:  Dr. Esmond Plants  Admission Diagnoses:   Left shoulder rotator cuff tear arthropathy  Discharge Diagnoses:   Left shoulder rotator cuff tear arthropathy   Past Medical History:  Diagnosis Date  . Anxiety   . Back pain, chronic   . CKD (chronic kidney disease) stage 3, GFR 30-59 ml/min (HCC)   . Compression fx, thoracic spine (HCC)    T - 11  . 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 Essential hypertension   . GERD (gastroesophageal reflux disease)   . HOH (hard of hearing)   . Inflammatory arthritis   . Iron deficiency anemia 01/2012  . 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 Spinal stenosis   . Tubular adenoma 11/2012    PCP: Celene Squibb, MD   Discharged Condition: good  Hospital Course:  Patient underwent the above stated procedure on 06/10/2017. Patient tolerated the procedure well and brought to the recovery room in good condition and subsequently to the floor. Patient had an uncomplicated hospital course and was stable for discharge.   Disposition: Discharge disposition: 03-Skilled Nursing Facility      with follow up in 2 weeks   Follow-up Information    Netta Cedars, MD. Call in 2 weeks.   Specialty:  Orthopedic Surgery Why:  [phone] Contact information: [address] [place] [address] [phone]           Discharge Instructions    Call MD / Call 911   Complete by:  As directed    If you experience chest pain or shortness of breath, CALL 911 and be transported to the hospital emergency room.  If you develope a fever above 101 F, pus (white drainage) or increased drainage or redness at the wound, or calf pain, call your surgeon's office.   Constipation Prevention   Complete by:  As directed    Drink plenty of fluids.  Prune juice may be helpful.  You may use a stool softener, such as Colace (over the counter) 100 mg twice a day.  Use MiraLax (over the counter) for constipation as needed.   Diet - low sodium heart healthy   Complete by:  As directed    Increase activity slowly as tolerated   Complete by:  As directed       Allergies as of 06/13/2017      Reactions   Flexeril [cyclobenzaprine] Anaphylaxis   Other Anaphylaxis, Other (See Comments)   ALL TREE NUTS   Keflex [cephalexin] Itching   Aspirin Other (See Comments)   Cannot take due to ulcers   Statins Other (See Comments)   is pain and swelling    Adhesive [tape] Rash   Chlorhexidine Rash      Medication List    TAKE these medications   ALPRAZolam 0.5 MG tablet Commonly known as:  XANAX Take 0.25-0.5 mg by mouth 2 (two) times daily as needed for anxiety.   budesonide-formoterol 160-4.5 MCG/ACT inhaler Commonly known as:  SYMBICORT Inhale 2 puffs into the lungs 2 (two) times daily.   EPIPEN 2-PAK 0.3 mg/0.3 mL Devi Generic drug:  EPINEPHrine Inject 0.3 mg into the muscle daily as needed (for anaphylatic allergic reactions.).   esomeprazole 40 MG capsule Commonly known as:  NEXIUM Take 40 mg by mouth daily before breakfast.   FASENRA 30 MG/ML Sosy Generic drug:  Benralizumab Inject 30 mg into the skin every 8 (eight) weeks.   ferrous sulfate 325 (65 FE) MG tablet Take 325 mg by mouth daily with breakfast.   fexofenadine 180 MG tablet Commonly known as:  ALLEGRA Take 180 mg by mouth daily.   fluticasone 50 MCG/ACT nasal spray Commonly known as:  FLONASE Place 1 spray into both nostrils 2 (two) times daily.   folic acid 1 MG tablet Commonly known as:  FOLVITE Take 1 mg by mouth daily.   HYDROcodone-acetaminophen 5-325 MG tablet Commonly known as:  NORCO Take 1-2 tablets by mouth every 6 (six) hours as needed for moderate pain or severe  pain. What changed:  reasons to take this   hydrocortisone 2.5 % rectal cream Commonly known as:  ANUSOL-HC Place 1 application rectally 3 (three) times daily. What changed:    when to take this  reasons to take this   ipratropium-albuterol 0.5-2.5 (3) MG/3ML Soln Commonly known as:  DUONEB Take 3 mLs by nebulization 2 (two) times daily.   ketotifen 0.025 % ophthalmic solution Commonly known as:  ZADITOR Place 1 drop into both eyes 2 (two) times daily.   Magnesium 250 MG Tabs Take 250 mg by mouth daily at 12 noon.   montelukast 10 MG tablet Commonly known as:  SINGULAIR Take 10 mg by mouth daily.   olmesartan 40 MG tablet Commonly known as:  BENICAR Take 40 mg by mouth daily.   polyethylene glycol packet Commonly known as:  MIRALAX Take 17 g daily by mouth. What changed:    when to take this  reasons to take this   potassium chloride 10 MEQ tablet Commonly known as:  K-DUR Take 10 mEq by mouth See admin instructions. Take 10 meq by mouth once daily for 7 days then off for 7 days, then restart   predniSONE 2.5 MG tablet Commonly known as:  DELTASONE Take 2.5 mg by mouth daily with breakfast.   PROAIR HFA 108 (90 Base) MCG/ACT inhaler Generic drug:  albuterol Inhale 2 puffs into the lungs every 6 (six) hours as needed for wheezing or shortness of breath. For COPD   SYSTANE OP Place 1 drop into both eyes 2 (two) times daily.   torsemide 10 MG tablet Commonly known as:  DEMADEX Take 10 mg by mouth See admin instructions. Take 10 mg by mouth daily for 7 days then off for 7 days, then restart   Turmeric 500 MG Caps Take 500 mg by mouth daily.   TYLENOL 8 HOUR ARTHRITIS PAIN 650 MG CR tablet Generic drug:  acetaminophen Take 650-1,300 mg by mouth every 8 (eight) hours as needed for pain.         Signed: Augustin Schooling 06/13/2017, 7:16 AM  [facility] is now [employer] [address] [place], [place], [place]  [address] Phone: [place]

## 2017-06-13 NOTE — Social Work (Signed)
CSW received call from [facility] that patient spoke to business office and has the balance resolved. SNF has offered a SNF bed for placement. CSW advied patient.  CSW then called [employer] to initiate Insurance Auth.  CSW will f/u.  Elissa Hefty, LCSW Clinical Social Worker [phone]

## 2017-06-13 NOTE — NC FL2 (Signed)
[place] LEVEL OF CARE SCREENING TOOL     IDENTIFICATION  Patient Name: Glenda Terry Birthdate: 1940-11-11 Sex: female Admission Date (Current Location): 06/10/2017  [facility] and [place] Number:  [employer] and Address:  [facility], [place] [address], [place], [place] [address]      Provider Number: [id number]  Attending Physician Name and Address:  Netta Cedars, MD  Relative Name and Phone Number:  Kasandra Knudsen, [phone]    Current Level of Care: Hospital Recommended Level of Care: [place] Prior Approval Number:    Date Approved/Denied:   PASRR Number: [id number]  Discharge Plan: SNF    Current Diagnoses: Patient Active Problem List   Diagnosis Date Noted  . 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 Asthma exacerbation 09/20/2015  . Acute respiratory failure ([place]) 09/20/2015  . CKD (chronic kidney disease) stage 3, GFR 30-59 ml/min (HCC) 09/20/2015  . Hemorrhoids, internal 08/21/2015  . 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 Microcytic anemia 11/01/2012  . FH: colon cancer 11/01/2012  . 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 Herniated lumbar intervertebral disc 04/20/2012  . Difficulty in walking(719.7) 03/13/2012  . Stiffness of joint, not elsewhere classified, lower leg 03/13/2012  . Weakness of right leg 03/13/2012  . OA (osteoarthritis) of knee 01/24/2012  . Anxiety 02/09/2011  . HTN (hypertension) 02/09/2011  . Arthritis 02/09/2011  . GERD (gastroesophageal reflux disease) 02/09/2011    Orientation RESPIRATION BLADDER Height & Weight     Self, Time, Situation, Place  Normal Incontinent Weight: 199 lb (90.3 kg) Height:  4' 11.5" (151.1 cm)  BEHAVIORAL SYMPTOMS/MOOD NEUROLOGICAL BOWEL NUTRITION STATUS      Continent Diet(See DC Summary)  AMBULATORY STATUS COMMUNICATION OF NEEDS Skin   Limited Assist Verbally Surgical wounds                       Personal Care Assistance Level of Assistance  Bathing, Feeding, Dressing Bathing Assistance: Maximum assistance Feeding assistance: Limited assistance Dressing Assistance: Maximum assistance     Functional Limitations Info  Sight, Hearing, Speech Sight Info: Adequate Hearing Info: Adequate Speech Info: Adequate    SPECIAL CARE FACTORS FREQUENCY  PT (By licensed PT), OT (By licensed OT)     PT Frequency: 4x week OT Frequency: 4x week            Contractures      Additional Factors Info  Code Status, Allergies Code Status Info: Full Allergies Info: FLEXERIL CYCLOBENZAPRINE, OTHER, KEFLEX CEPHALEXIN, ASPIRIN, STATINS, ADHESIVE TAPE, CHLORHEXIDINE            Current Medications (06/13/2017):  This is the current hospital active medication list Current Facility-Administered Medications  Medication Dose Route Frequency Provider Last Rate Last Dose  . 0.9 %  sodium chloride infusion   Intravenous Continuous Netta Cedars, MD 50 mL/hr at 06/10/17 2110    . acetaminophen (TYLENOL) tablet 325-650 mg  325-650 mg Oral Q6H PRN Netta Cedars, MD      . albuterol (PROVENTIL) (2.5 MG/3ML) 0.083% nebulizer solution 2.5 mg  2.5 mg Inhalation Q6H PRN Netta Cedars, MD      . ALPRAZolam Duanne Moron) tablet 0.25-0.5 mg  0.25-0.5 mg Oral BID PRN Netta Cedars, MD   0.25 mg at 06/13/17 1009  . bisacodyl (DULCOLAX) suppository 10 mg  10 mg Rectal Daily PRN Netta Cedars, MD      . docusate sodium (COLACE) capsule 100 mg  100 mg Oral BID Netta Cedars, MD   100 mg at 06/13/17 0944  . ferrous sulfate tablet 325 mg  325 mg Oral Q breakfast Netta Cedars, MD   325 mg at 06/13/17 0944  . fluticasone (FLONASE) 50 MCG/ACT nasal spray 1 spray  1 spray Each Nare BID Netta Cedars, MD   1 spray at 06/13/17 [phone]  . folic acid (FOLVITE) tablet 1 mg  1 mg Oral Daily Netta Cedars, MD   1 mg at 06/13/17 0944  . HYDROcodone-acetaminophen (NORCO) 7.5-325 MG per tablet 1-2 tablet  1-2  tablet Oral Q4H PRN Netta Cedars, MD   2 tablet at 06/12/17 1745  . HYDROcodone-acetaminophen (NORCO/VICODIN) 5-325 MG per tablet 1-2 tablet  1-2 tablet Oral Q4H PRN Netta Cedars, MD   2 tablet at 06/13/17 4696  . hydrocortisone (ANUSOL-HC) 2.5 % rectal cream 1 application  1 application Rectal TID PRN Netta Cedars, MD      . ipratropium-albuterol (DUONEB) 0.5-2.5 (3) MG/3ML nebulizer solution 3 mL  3 mL Nebulization BID Netta Cedars, MD   3 mL at 06/13/17 0859  . irbesartan (AVAPRO) tablet 300 mg  300 mg Oral Daily Netta Cedars, MD   300 mg at 06/13/17 0943  . ketotifen (ZADITOR) 0.025 % ophthalmic solution 1 drop  1 drop Both Eyes BID Netta Cedars, MD   1 drop at 06/13/17 0947  . loratadine (CLARITIN) tablet 10 mg  10 mg Oral Daily Netta Cedars, MD   10 mg at 06/13/17 0943  . menthol-cetylpyridinium (CEPACOL) lozenge 3 mg  1 lozenge Oral PRN Netta Cedars, MD       Or  . phenol (CHLORASEPTIC) mouth spray 1 spray  1 spray Mouth/Throat PRN Netta Cedars, MD      . methocarbamol (ROBAXIN) tablet 500 mg  500 mg Oral Q6H PRN Netta Cedars, MD   500 mg at 06/12/17 1745   Or  . methocarbamol (ROBAXIN) 500 mg in dextrose 5 % 50 mL IVPB  500 mg Intravenous Q6H PRN Netta Cedars, MD      . metoCLOPramide (REGLAN) tablet 5-10 mg  5-10 mg Oral Q8H PRN Netta Cedars, MD       Or  . metoCLOPramide (REGLAN) injection 5-10 mg  5-10 mg Intravenous Q8H PRN Netta Cedars, MD      . mometasone-formoterol [facility]) 200-5 MCG/ACT inhaler 2 puff  2 puff Inhalation BID Netta Cedars, MD   2 puff at 06/13/17 [phone]  . montelukast (SINGULAIR) tablet 10 mg  10 mg Oral Daily Netta Cedars, MD   10 mg at 06/13/17 0943  . morphine 2 MG/ML injection 1-2 mg  1-2 mg Intravenous Q2H PRN Netta Cedars, MD   1 mg at 06/11/17 2219  . ondansetron (ZOFRAN) tablet 4 mg  4 mg Oral Q6H PRN Netta Cedars, MD       Or  . ondansetron [facility]) injection 4 mg  4 mg Intravenous Q6H PRN Netta Cedars, MD   4 mg at 06/12/17 0946  .  pantoprazole (PROTONIX) EC tablet 40 mg  40 mg Oral Daily Netta Cedars, MD   40 mg at 06/13/17 0944  . polyethylene glycol (MIRALAX / GLYCOLAX) packet 17 g  17 g Oral Daily PRN Netta Cedars, MD   17 g at 06/13/17 0954  . polyvinyl alcohol (LIQUIFILM TEARS) 1.4 % ophthalmic solution 1 drop  1 drop Both Eyes BID Netta Cedars, MD   1 drop at 06/13/17 0947  . potassium chloride (K-DUR,KLOR-CON) CR tablet 10 mEq  10  mEq Oral Daily Netta Cedars, MD   10 mEq at 06/13/17 0944  . [START ON 06/22/2017] potassium chloride (K-DUR,KLOR-CON) CR tablet 10 mEq  10 mEq Oral Daily Netta Cedars, MD      . predniSONE (DELTASONE) tablet 2.5 mg  2.5 mg Oral Q breakfast Netta Cedars, MD   2.5 mg at 06/12/17 0981  . torsemide (DEMADEX) tablet 10 mg  10 mg Oral Daily Netta Cedars, MD   10 mg at 06/12/17 1914  . [START ON 06/22/2017] torsemide (DEMADEX) tablet 10 mg  10 mg Oral Daily Netta Cedars, MD         Discharge Medications: Please see discharge summary for a list of discharge medications.  Relevant Imaging Results:  Relevant Lab Results:   Additional Information SS#: [id number]  Normajean Baxter, LCSW

## 2017-06-13 NOTE — Progress Notes (Signed)
Physical Therapy Treatment Patient Details Name: Glenda Terry MRN: [id number] DOB: 1940/04/20 Today's Date: 06/13/2017    History of Present Illness Pt is a 77 y.o. female s/p L reverse total shoulder arthroplasty. PMH significant for anxiety, back pain, CKD, Cushing's syndrome, essential hypertension, GERD, HOH inflammatory arthritis, iron deficiency anemia, pulmonary eosinophilia, spinal stenosis, tubular adenoma. She has a history of R reverse total shoulder arthroplasty.    PT Comments    Pt progressing with ambulation remains very weak with L UE as expected. Acute PT to con't to follow.   Follow Up Recommendations  SNF     Equipment Recommendations       Recommendations for Other Services       Precautions / Restrictions Precautions Precautions: Shoulder Type of Shoulder Precautions: Active protocol: NWB LUE, AROM elbow/wrist/hand to tolerance, AROM/PROM L shoulder forward flexion (0-90), abduction (0-60), external rotation (0-30); "OK for ADL and self-care" Shoulder Interventions: Shoulder sling/immobilizer;For comfort Precaution Booklet Issued: Yes (comment) Precaution Comments: Reviewed precautions in detail Required Braces or Orthoses: Sling Restrictions Weight Bearing Restrictions: Yes LUE Weight Bearing: Non weight bearing    Mobility  Bed Mobility Overal bed mobility: Needs Assistance Bed Mobility: Supine to Sit     Supine to sit: Min guard     General bed mobility comments: used momentum, pulled up on bed rail  Transfers Overall transfer level: Needs assistance Equipment used: None Transfers: Sit to/from Stand Sit to Stand: Min assist         General transfer comment: v/c's to push up with R UE on bed, minA to power up and steady  Ambulation/Gait Ambulation/Gait assistance: Min guard Ambulation Distance (Feet): 150 Feet Assistive device: Straight cane   Gait velocity: slow Gait velocity interpretation: Below normal speed for  age/gender General Gait Details: lateral sway L/R with onset of fatigue, progressive to minA due to instability with onset of fatigue, mild SOB, pt took 2 puffs of inhaler in her room   Stairs            Wheelchair Mobility    Modified Rankin (Stroke Patients Only)       Balance Overall balance assessment: Needs assistance Sitting-balance support: No upper extremity supported;Feet supported Sitting balance-Leahy Scale: Fair     Standing balance support: Single extremity supported;During functional activity;No upper extremity supported Standing balance-Leahy Scale: Fair Standing balance comment: used cane for balance                             Cognition Arousal/Alertness: Awake/alert Behavior During Therapy: WFL for tasks assessed/performed Overall Cognitive Status: Within Functional Limits for tasks assessed                                        Exercises Shoulder Exercises Elbow Flexion: AAROM;Left;10 reps;Seated Elbow Extension: AAROM;Left;10 reps;Seated Wrist Flexion: Left;10 reps;Seated;AROM Wrist Extension: Left;AAROM;10 reps;Seated Digit Composite Flexion: AROM;Left;10 reps;Seated Composite Extension: AROM;Left;10 reps;Seated    General Comments        Pertinent Vitals/Pain Pain Assessment: 0-10 Pain Score: 4  Pain Location: L shoulder, just received pain meds Pain Descriptors / Indicators: Aching;Discomfort;Operative site guarding Pain Intervention(s): Monitored during session    Home Living                      Prior Function  PT Goals (current goals can now be found in the care plan section) Acute Rehab PT Goals Patient Stated Goal: go to rehab and get better before home Progress towards PT goals: Progressing toward goals    Frequency    Min 4X/week      PT Plan Current plan remains appropriate    Co-evaluation              AM-PAC PT "6 Clicks" Daily Activity  Outcome  Measure  Difficulty turning over in bed (including adjusting bedclothes, sheets and blankets)?: Unable Difficulty moving from lying on back to sitting on the side of the bed? : Unable Difficulty sitting down on and standing up from a chair with arms (e.g., wheelchair, bedside commode, etc,.)?: A Little Help needed moving to and from a bed to chair (including a wheelchair)?: A Little Help needed walking in hospital room?: A Little Help needed climbing 3-5 steps with a railing? : A Lot 6 Click Score: 13    End of Session Equipment Utilized During Treatment: Gait belt Activity Tolerance: Patient limited by fatigue Patient left: in chair;with call bell/phone within reach Nurse Communication: Mobility status PT Visit Diagnosis: Unsteadiness on feet (R26.81);Muscle weakness (generalized) (M62.81);Difficulty in walking, not elsewhere classified (R26.2)     Time: 1610-9604 PT Time Calculation (min) (ACUTE ONLY): 17 min  Charges:  $Gait Training: 8-22 mins                    G Codes:       Kittie Plater, PT, DPT Pager #: [phone] Office #: [phone]    [place] 06/13/2017, 2:16 PM

## 2017-06-13 NOTE — Clinical Social Work Placement (Signed)
   CLINICAL SOCIAL WORK PLACEMENT  NOTE  Date:  06/13/2017  Patient Details  Name: Glenda Terry MRN: [id number] Date of Birth: 1941-02-23  Clinical Social Work is seeking post-discharge placement for this patient at the [place] level of care (*CSW will initial, date and re-position this form in  chart as items are completed):  Yes   Patient/family provided with [place] Work Department's list of facilities offering this level of care within the geographic area requested by the patient (or if unable, by the patient's family).  Yes   Patient/family informed of their freedom to choose among providers that offer the needed level of care, that participate in Medicare, Medicaid or managed care program needed by the patient, have an available bed and are willing to accept the patient.  Yes   Patient/family informed of [facility]'s ownership interest in [facility] and [facility], as well as of the fact that they are under no obligation to receive care at these facilities.  PASRR submitted to EDS on       PASRR number received on       Existing PASRR number confirmed on 06/13/17     FL2 transmitted to all facilities in geographic area requested by pt/family on       FL2 transmitted to all facilities within larger geographic area on 06/13/17     Patient informed that his/her managed care company has contracts with or will negotiate with certain facilities, including the following:        Yes   Patient/family informed of bed offers received.  Patient chooses bed at [facility]     Physician recommends and patient chooses bed at      Patient to be transferred to [facility] on 06/13/17.  Patient to be transferred to facility by [facility]     Patient family notified on 06/13/17 of transfer.  Name of family member notified:  daughter [name] contacted     PHYSICIAN       Additional Comment:     _______________________________________________ Normajean Baxter, LCSW 06/13/2017, 2:21 PM

## 2017-06-13 NOTE — Social Work (Addendum)
CSW was advised by [facility] that patient has a balance from previous stay and patient receives an injection every 8 weeks that is costly. CSW advised that patient gets injection at [facility].  CSW then received call from [facility] that they cannot offer a SNF bed for patient due to expensive medications.  CSW discussed the status with patient and she advised that she does not owe any balance to [facility], CSW advised that CSW can advised admission, but CSW cannot dispute. CSW discussed other SNF's and patient indicated that she would not consider [place] SNF's or [place].  CSW then called [facility] and they indicated that patient should call business office to discuss. [facility] has not made bed offer yet.  CSW will f/u and discuss with RNCM.  Elissa Hefty, LCSW Clinical Social Worker [phone]

## 2017-06-13 NOTE — Patient Outreach (Signed)
Incoming call received from patient who reports she is still in hospital after having shoulder surgery, pt states she is doing well and plans to discharge today or tomorrow and will go to skilled nursing facility for short term rehab, pt states she will keep RN CM updated and hopefully will be at rehab 1-3 weeks.  PLAN Continue to follow  Jacqlyn Larsen [facility], BSN [place] Coordinator [phone]

## 2017-06-14 ENCOUNTER — Other Ambulatory Visit: Payer: Self-pay

## 2017-06-14 ENCOUNTER — Inpatient Hospital Stay
Admission: RE | Admit: 2017-06-14 | Discharge: 2017-06-25 | Disposition: A | Discharge status: Home / Self Care | Payer: PPO | Source: Ambulatory Visit | Attending: Internal Medicine | Admitting: Internal Medicine

## 2017-06-14 ENCOUNTER — Other Ambulatory Visit: Payer: Self-pay | Admitting: *Deleted

## 2017-06-14 DIAGNOSIS — M75102 Unspecified rotator cuff tear or rupture of left shoulder, not specified as traumatic: Secondary | ICD-10-CM | POA: Diagnosis not present

## 2017-06-14 DIAGNOSIS — N183 Chronic kidney disease, stage 3 (moderate): Secondary | ICD-10-CM | POA: Diagnosis not present

## 2017-06-14 DIAGNOSIS — M6281 Muscle weakness (generalized): Secondary | ICD-10-CM | POA: Diagnosis not present

## 2017-06-14 DIAGNOSIS — M1990 Unspecified osteoarthritis, unspecified site: Secondary | ICD-10-CM | POA: Diagnosis not present

## 2017-06-14 DIAGNOSIS — Z471 Aftercare following joint replacement surgery: Secondary | ICD-10-CM | POA: Diagnosis not present

## 2017-06-14 DIAGNOSIS — I10 Essential (primary) hypertension: Secondary | ICD-10-CM | POA: Diagnosis not present

## 2017-06-14 DIAGNOSIS — E249 Cushing's syndrome, unspecified: Secondary | ICD-10-CM | POA: Diagnosis not present

## 2017-06-14 DIAGNOSIS — G8911 Acute pain due to trauma: Secondary | ICD-10-CM | POA: Diagnosis not present

## 2017-06-14 DIAGNOSIS — J449 Chronic obstructive pulmonary disease, unspecified: Secondary | ICD-10-CM | POA: Diagnosis not present

## 2017-06-14 DIAGNOSIS — Z96611 Presence of right artificial shoulder joint: Secondary | ICD-10-CM | POA: Diagnosis not present

## 2017-06-14 DIAGNOSIS — F419 Anxiety disorder, unspecified: Secondary | ICD-10-CM | POA: Diagnosis not present

## 2017-06-14 DIAGNOSIS — J4550 Severe persistent asthma, uncomplicated: Secondary | ICD-10-CM | POA: Diagnosis not present

## 2017-06-14 DIAGNOSIS — S4990XA Unspecified injury of shoulder and upper arm, unspecified arm, initial encounter: Secondary | ICD-10-CM | POA: Diagnosis not present

## 2017-06-14 DIAGNOSIS — Z87891 Personal history of nicotine dependence: Secondary | ICD-10-CM | POA: Diagnosis not present

## 2017-06-14 DIAGNOSIS — M545 Low back pain: Secondary | ICD-10-CM | POA: Diagnosis not present

## 2017-06-14 DIAGNOSIS — M064 Inflammatory polyarthropathy: Secondary | ICD-10-CM | POA: Diagnosis not present

## 2017-06-14 DIAGNOSIS — K219 Gastro-esophageal reflux disease without esophagitis: Secondary | ICD-10-CM | POA: Diagnosis not present

## 2017-06-14 DIAGNOSIS — D509 Iron deficiency anemia, unspecified: Secondary | ICD-10-CM | POA: Diagnosis not present

## 2017-06-14 DIAGNOSIS — H9190 Unspecified hearing loss, unspecified ear: Secondary | ICD-10-CM | POA: Diagnosis not present

## 2017-06-14 DIAGNOSIS — M4805 Spinal stenosis, thoracolumbar region: Secondary | ICD-10-CM | POA: Diagnosis not present

## 2017-06-14 DIAGNOSIS — Z96612 Presence of left artificial shoulder joint: Secondary | ICD-10-CM | POA: Diagnosis not present

## 2017-06-14 DIAGNOSIS — M19012 Primary osteoarthritis, left shoulder: Secondary | ICD-10-CM | POA: Diagnosis not present

## 2017-06-14 DIAGNOSIS — D721 Eosinophilia: Secondary | ICD-10-CM | POA: Diagnosis not present

## 2017-06-14 DIAGNOSIS — R262 Difficulty in walking, not elsewhere classified: Secondary | ICD-10-CM | POA: Diagnosis not present

## 2017-06-14 NOTE — Social Work (Signed)
Clinical Social Worker facilitated patient discharge including contacting patient family and facility to confirm patient discharge plans.  Clinical information faxed to facility and family agreeable with plan.    CSW arranged ambulance transport via [facility] to [facility].    RN to call [phone] to give report prior to discharge.  Clinical Social Worker will sign off for now as social work intervention is no longer needed. Please consult us again if new need arises.  Alysandra Lobue, LCSW Clinical Social Worker [phone]    

## 2017-06-14 NOTE — Progress Notes (Signed)
Orthopedics Progress Note  Subjective: Patient feeling better today, ready for transfer to SNF  Objective:  Vitals:   06/13/17 2127 06/14/17 0508  BP: 135/77 111/65  Pulse: (!) 114 91  Resp: 20 16  Temp: 99 F (37.2 C) 98.9 F (37.2 C)  SpO2: 97% 93%    General: Awake and alert  Musculoskeletal: left shoulder dressing CDI, mod swelling and bruising in arm, compartments supple, NVI Neurovascularly intact  Lab Results  Component Value Date   WBC 9.2 06/03/2017   HGB 10.1 (L) 06/11/2017   HCT 34.2 (L) 06/11/2017   MCV 79.3 06/03/2017   PLT 295 06/03/2017       Component Value Date/Time   NA 135 06/11/2017 0623   NA 135 (A) 05/18/2012   K 4.0 06/11/2017 0623   K 3.6 05/18/2012   CL 101 06/11/2017 0623   CO2 23 06/11/2017 0623   GLUCOSE 171 (H) 06/11/2017 0623   BUN 15 06/11/2017 0623   BUN 7 05/18/2012   CREATININE 1.28 (H) 06/11/2017 0623   CREATININE 0.93 07/04/2012 0900   CALCIUM 9.3 06/11/2017 0623   GFRNONAA 40 (L) 06/11/2017 0623   GFRAA 47 (L) 06/11/2017 0623    Lab Results  Component Value Date   INR 0.98 02/08/2017   INR 0.94 04/11/2012   INR 0.90 01/19/2012    Assessment/Plan: POD #4  s/p Procedure(s): LEFT REVERSE SHOULDER ARTHROPLASTY Discharge to SNF this AM, follow up in two weeks  Doran Heater. Veverly Fells, MD 06/14/2017 6:07 AM

## 2017-06-14 NOTE — Care Management Important Message (Signed)
Important Message  Patient Details  Name: Glenda Terry MRN: [id number] Date of Birth: 1940/07/04   Medicare Important Message Given:  Yes    Orbie Pyo 06/14/2017, 2:49 PM

## 2017-06-14 NOTE — Progress Notes (Signed)
MD aware of above.  Unable to access AVS at this time, will address in approx 45 min.  AKingBSNRN

## 2017-06-14 NOTE — Clinical Social Work Note (Signed)
Clinical Social Work Assessment  Patient Details  Name: Glenda Terry MRN: [id number] Date of Birth: 02-Jul-1940  Date of referral:  06/13/17               Reason for consult:  Facility Placement                Permission sought to share information with:  Facility Art therapist granted to share information::  Yes, Verbal Permission Granted  Name::     [name]  Agency::  SNF  Relationship::     Contact Information:     Housing/Transportation Living arrangements for the past 2 months:  [employer] of Information:  Patient Patient Interpreter Needed:  None Criminal Activity/Legal Involvement Pertinent to Current Situation/Hospitalization:  No - Comment as needed Significant Relationships:  Adult Children, Other Family Members, Friend Lives with:  Self Do you feel safe going back to the place where you live?  No Need for family participation in patient care:  No (Coment)  Care giving concerns:  Pt will new impairment and will need short term rehab before returning home. Pt resides alone.  Social Worker assessment / plan:  CSW met with patient at bedside to discuss SNF placement. Pt agreeable to same and desires [facility]. CSW explained SNF process and placement and discussed insurance process. Pt in agreement and gave verbal permission for CSW to send referrals to [place] and [place]. CSW will f/u for disposition.  Employment status:  Retired Forensic scientist:    PT Recommendations:  [place] / Referral to community resources:  [place]  Patient/Family's Response to care:  Patient appreciative of [place] meeting to discuss disposition. Pt already knows where she desires to go for short term rehab. No issues or concerns identified.  Patient/Family's Understanding of and Emotional Response to Diagnosis, Current Treatment, and Prognosis:  Patient/family have good understanding  of pt's impairment. Pt resides alone and knows that she will struggle with ADL's given her history of  Right knee injury, right arm issues and new injury. Pt is also a high falls risk. Pt will got to SNF and return home once she has completed rehabilitation.  CSW will continue to follow for disposition.  Emotional Assessment Appearance:  Appears stated age Attitude/Demeanor/Rapport:  (Cooperative) Affect (typically observed):  Accepting, Appropriate Orientation:  Oriented to Situation, Oriented to  Time, Oriented to Place, Oriented to Self Alcohol / Substance use:  Not Applicable Psych involvement (Current and /or in the community):  No (Comment)  Discharge Needs  Concerns to be addressed:  Discharge Planning Concerns Readmission within the last 30 days:  No Current discharge risk:  Physical Impairment, Dependent with Mobility Barriers to Discharge:  No Barriers Identified   Normajean Baxter, LCSW 06/14/2017, 10:45 AM

## 2017-06-14 NOTE — Clinical Social Work Placement (Signed)
   CLINICAL SOCIAL WORK PLACEMENT  NOTE  Date:  06/14/2017  Patient Details  Name: Glenda Terry MRN: [id number] Date of Birth: [birth year]  Clinical Social Work is seeking post-discharge placement for this patient at the [place] level of care (*CSW will initial, date and re-position this form in  chart as items are completed):  Yes   Patient/family provided with [place] Work Department's list of facilities offering this level of care within the geographic area requested by the patient (or if unable, by the patient's family).  Yes   Patient/family informed of their freedom to choose among providers that offer the needed level of care, that participate in Medicare, Medicaid or managed care program needed by the patient, have an available bed and are willing to accept the patient.  Yes   Patient/family informed of [facility]'s ownership interest in [facility] and [facility], as well as of the fact that they are under no obligation to receive care at these facilities.  PASRR submitted to EDS on       PASRR number received on       Existing PASRR number confirmed on 06/13/17     FL2 transmitted to all facilities in geographic area requested by pt/family on       FL2 transmitted to all facilities within larger geographic area on 06/13/17     Patient informed that his/her managed care company has contracts with or will negotiate with certain facilities, including the following:        Yes   Patient/family informed of bed offers received.  Patient chooses bed at [facility]     Physician recommends and patient chooses bed at      Patient to be transferred to [facility] on 06/14/17.  Patient to be transferred to facility by [facility]     Patient family notified on 06/13/17 of transfer.  Name of family member notified:  [name] contacted     PHYSICIAN       Additional Comment:     _______________________________________________ Normajean Baxter, LCSW 06/14/2017, 10:38 AM

## 2017-06-14 NOTE — Progress Notes (Signed)
Spoke with Glenda Terry in MD office, will notify MD that pt needs med rec. in order to print AVS for discharge.  Awaiting return call and will look for ability to print AVS.  AKingBSNRN

## 2017-06-14 NOTE — Progress Notes (Signed)
Physical Therapy Treatment Patient Details Name: Glenda Terry MRN: [id number] DOB: Oct 08, 1940 Today's Date: 06/14/2017    History of Present Illness Pt is a 77 y.o. female s/p L reverse total shoulder arthroplasty. PMH significant for anxiety, back pain, CKD, Cushing's syndrome, essential hypertension, GERD, HOH inflammatory arthritis, iron deficiency anemia, pulmonary eosinophilia, spinal stenosis, tubular adenoma. She has a history of R reverse total shoulder arthroplasty.    PT Comments    Patient continues to be unsafe to discharge home alone due to inability to perform self-care tasks independently and ambulated safely independently and will benefit from continued post-acute therapy. Currently recommending skilled nursing facility stay unless caregivers can provide 24-hour care.     Follow Up Recommendations  SNF     Equipment Recommendations       Recommendations for Other Services       Precautions / Restrictions Precautions Precautions: Shoulder Type of Shoulder Precautions: Active protocol: NWB LUE, AROM elbow/wrist/hand to tolerance, AROM/PROM L shoulder forward flexion (0-90), abduction (0-60), external rotation (0-30); "OK for ADL and self-care" Shoulder Interventions: Shoulder sling/immobilizer;For comfort Precaution Booklet Issued: Yes (comment) Precaution Comments: Reviewed precautions in detail Required Braces or Orthoses: Sling(L shoulder) Restrictions Weight Bearing Restrictions: Yes LUE Weight Bearing: Non weight bearing    Mobility  Bed Mobility               General bed mobility comments: up in recliner with therapist arrived.  Transfers Overall transfer level: Needs assistance Equipment used: None Transfers: Sit to/from Stand Sit to Stand: Min assist;Min guard         General transfer comment: min A to steady upon standing  Ambulation/Gait Ambulation/Gait assistance: Min guard Ambulation Distance (Feet): 250 Feet Assistive device:  (hand held; daughter took her [facility] home yesterday)   Gait velocity: slow; attempted quicker pace and had more lateral sway Gait velocity interpretation: Below normal speed for age/gender General Gait Details: lateral sway L/R with onset of fatigue, progressive to minA due to instability with onset of fatigue, mild SOB   Stairs            Wheelchair Mobility    Modified Rankin (Stroke Patients Only)       Balance Overall balance assessment: Needs assistance   Sitting balance-Leahy Scale: Good     Standing balance support: Single extremity supported;During functional activity Standing balance-Leahy Scale: Fair Standing balance comment: used hand held assistance for balance                            Cognition Arousal/Alertness: Awake/alert Behavior During Therapy: WFL for tasks assessed/performed Overall Cognitive Status: Within Functional Limits for tasks assessed                                        Exercises Shoulder Exercises Elbow Flexion: Left;10 reps;Seated;AAROM Elbow Extension: AAROM;Left;10 reps;Seated Wrist Flexion: Left;10 reps;Seated;AROM Wrist Extension: Left;AAROM;10 reps;Seated Digit Composite Flexion: AROM;Left;10 reps;Seated Composite Extension: Left;10 reps;Seated;AROM    General Comments        Pertinent Vitals/Pain Pain Assessment: Faces Faces Pain Scale: Hurts a little bit Pain Location: L shoulder Pain Descriptors / Indicators: Grimacing;Operative site guarding;Aching;Discomfort Pain Intervention(s): Monitored during session;Limited activity within patient's tolerance    Home Living Family/patient expects to be discharged to:: Skilled nursing facility  Prior Function            PT Goals (current goals can now be found in the care plan section) Acute Rehab PT Goals Patient Stated Goal: go to rehab and get better before home Progress towards PT goals: Progressing toward  goals    Frequency    Min 4X/week      PT Plan Current plan remains appropriate    Co-evaluation              AM-PAC PT "6 Clicks" Daily Activity  Outcome Measure                   End of Session Equipment Utilized During Treatment: Gait belt Activity Tolerance: Patient limited by fatigue Patient left: in chair;with call bell/phone within reach Nurse Communication: Mobility status PT Visit Diagnosis: Unsteadiness on feet (R26.81);Muscle weakness (generalized) (M62.81);Difficulty in walking, not elsewhere classified (R26.2)     Time: 8270-7867 PT Time Calculation (min) (ACUTE ONLY): 28 min  Charges:  $Gait Training: 8-22 mins $Therapeutic Activity: 8-22 mins                    G Codes:       Glenda Monceaux D. Hartnett-Rands, MS, PT Per [place] [phone] 06/14/2017, 11:01 AM

## 2017-06-14 NOTE — Social Work (Signed)
CSW contacted [employer] and they indicated that they have denied the patient for SNF and are requesting a peer to peer review. Dr. Veverly Fells will need to speak with Dr. Amalia Hailey [phone]) of [facility] for the peer review.  CSW will f/u for disposition.  10:40 am: [facility] then contacted CSW and indicated that patient was approved for SNF, BEEF#[id number] and no peer to peer needed.  CSW will f/u for disposition.  Elissa Hefty, LCSW Clinical Social Worker [phone]

## 2017-06-14 NOTE — Progress Notes (Signed)
Occupational Therapy Treatment Patient Details Name: Glenda Terry MRN: [id number] DOB: [birth year] Today's Date: 06/14/2017    History of present illness Pt is a 77 y.o. female s/p L reverse total shoulder arthroplasty. PMH significant for anxiety, back pain, CKD, Cushing's syndrome, essential hypertension, GERD, HOH inflammatory arthritis, iron deficiency anemia, pulmonary eosinophilia, spinal stenosis, tubular adenoma. She has a history of R reverse total shoulder arthroplasty.   OT comments  Pt demonstrating progress toward OT goals today. She demonstrates improved independence with active shoulder protocol HEP participation today. She continues to require min guard assist to stand during static ADL as well as mod assist for UB dressing tasks. D/C recommendation remains appropriate and note plan to D/C to [facility] center today.    Follow Up Recommendations  Follow surgeon's recommendation for DC plan and follow-up therapies;Supervision/Assistance - 24 hour;SNF    Equipment Recommendations  Other (comment)(defer to next venue of care)    Recommendations for Other Services PT consult    Precautions / Restrictions Precautions Precautions: Shoulder Type of Shoulder Precautions: Active protocol: NWB LUE, AROM elbow/wrist/hand to tolerance, AROM/PROM L shoulder forward flexion (0-90), abduction (0-60), external rotation (0-30); "OK for ADL and self-care" Shoulder Interventions: Shoulder sling/immobilizer;For comfort Precaution Booklet Issued: Yes (comment) Precaution Comments: Reviewed precautions in detail Required Braces or Orthoses: Sling(L shoulder) Restrictions Weight Bearing Restrictions: Yes LUE Weight Bearing: Non weight bearing       Mobility Bed Mobility               General bed mobility comments: OOB in recliner on arrival  Transfers Overall transfer level: Needs assistance Equipment used: None Transfers: Sit to/from Stand Sit to Stand: Min guard          General transfer comment: Min guard assist to steady when standing.     Balance Overall balance assessment: Needs assistance Sitting-balance support: No upper extremity supported;Feet supported Sitting balance-Leahy Scale: Good     Standing balance support: Single extremity supported;During functional activity Standing balance-Leahy Scale: Fair Standing balance comment: used hand held assistance for balance                           ADL either performed or assessed with clinical judgement   ADL Overall ADL's : Needs assistance/impaired                 Upper Body Dressing : Moderate assistance;Sitting Upper Body Dressing Details (indicate cue type and reason): For donning/doffing sling                   General ADL Comments: Session focused on review of active shoulder protocol HEP. Pt able to complete with cues for external rotation and abduction techniques as well as to remember forearm pronation/supination exercises.      Vision   Vision Assessment?: No apparent visual deficits   Perception     Praxis      Cognition Arousal/Alertness: Awake/alert Behavior During Therapy: WFL for tasks assessed/performed Overall Cognitive Status: Within Functional Limits for tasks assessed                                          Exercises Exercises: Shoulder Shoulder Exercises Shoulder Flexion: AAROM;Left;10 reps;Supine(able to achieve ~0-30 today) Shoulder ABduction: AAROM;Left;10 reps;Seated(0-60) Shoulder External Rotation: AAROM;10 reps;Left;Seated(0-30) Elbow Flexion: Left;10 reps;Seated;AAROM Elbow Extension: Left;10 reps;Seated;AROM Wrist Flexion: Left;10 reps;Seated;AROM  Wrist Extension: Left;10 reps;Seated;AROM Digit Composite Flexion: AROM;Left;10 reps;Seated Composite Extension: Left;10 reps;Seated;AROM   Shoulder Instructions Shoulder Instructions Donning/doffing shirt without moving shoulder: Moderate assistance Method for  sponge bathing under operated UE: Minimal assistance Donning/doffing sling/immobilizer: Moderate assistance Correct positioning of sling/immobilizer: Minimal assistance ROM for elbow, wrist and digits of operated UE: Supervision/safety Sling wearing schedule (on at all times/off for ADL's): Supervision/safety Proper positioning of operated UE when showering: Minimal assistance Positioning of UE while sleeping: Minimal assistance     General Comments      Pertinent Vitals/ Pain       Pain Assessment: Faces Faces Pain Scale: Hurts a little bit Pain Location: L shoulder Pain Descriptors / Indicators: Grimacing;Operative site guarding;Aching;Discomfort Pain Intervention(s): Monitored during session;Limited activity within patient's tolerance;Repositioned  Home Living Family/patient expects to be discharged to:: Skilled nursing facility                                        Prior Functioning/Environment              Frequency  Min 3X/week        Progress Toward Goals  OT Goals(current goals can now be found in the care plan section)  Progress towards OT goals: Progressing toward goals  Acute Rehab OT Goals Patient Stated Goal: go to rehab and get better before home OT Goal Formulation: With patient Time For Goal Achievement: 06/25/17 Potential to Achieve Goals: Good  Plan Discharge plan remains appropriate    Co-evaluation                 AM-PAC PT "6 Clicks" Daily Activity     Outcome Measure   Help from another person eating meals?: A Little Help from another person taking care of personal grooming?: A Little Help from another person toileting, which includes using toliet, bedpan, or urinal?: A Lot Help from another person bathing (including washing, rinsing, drying)?: A Lot Help from another person to put on and taking off regular upper body clothing?: A Lot Help from another person to put on and taking off regular lower body  clothing?: A Lot 6 Click Score: 14    End of Session Equipment Utilized During Treatment: Other (comment)(L shoulder sling)  OT Visit Diagnosis: Other abnormalities of gait and mobility (R26.89);Pain Pain - Right/Left: Left Pain - part of body: Shoulder   Activity Tolerance Patient tolerated treatment well   Patient Left in chair;with call bell/phone within reach   Nurse Communication Mobility status        Time: 1050-1110 OT Time Calculation (min): 20 min  Charges: OT General Charges $OT Visit: 1 Visit OT Treatments $Therapeutic Exercise: 8-22 mins  Norman Herrlich, MS OTR/L  Pager: [place] [facility] [place] 06/14/2017, 11:28 AM

## 2017-06-14 NOTE — Patient Outreach (Signed)
Per hospital liason, pt transferred to skilled nursing facility for rehab,  CSW to follow.   Jacqlyn Larsen [facility], [place] Coordinator [phone]

## 2017-06-15 ENCOUNTER — Non-Acute Institutional Stay (SKILLED_NURSING_FACILITY): Payer: PPO | Admitting: Internal Medicine

## 2017-06-15 ENCOUNTER — Encounter: Payer: Self-pay | Admitting: Internal Medicine

## 2017-06-15 DIAGNOSIS — K219 Gastro-esophageal reflux disease without esophagitis: Secondary | ICD-10-CM | POA: Diagnosis not present

## 2017-06-15 DIAGNOSIS — J449 Chronic obstructive pulmonary disease, unspecified: Secondary | ICD-10-CM | POA: Diagnosis not present

## 2017-06-15 DIAGNOSIS — F419 Anxiety disorder, unspecified: Secondary | ICD-10-CM | POA: Diagnosis not present

## 2017-06-15 DIAGNOSIS — Z96612 Presence of left artificial shoulder joint: Secondary | ICD-10-CM

## 2017-06-15 DIAGNOSIS — I10 Essential (primary) hypertension: Secondary | ICD-10-CM

## 2017-06-15 DIAGNOSIS — D509 Iron deficiency anemia, unspecified: Secondary | ICD-10-CM

## 2017-06-15 NOTE — Progress Notes (Signed)
Location:   [facility] Room Number: 133/P Place of Service:  SNF (31) Provider:  Cory Roughen, MD  Patient Care Team: Celene Squibb, MD as PCP - General (Internal Medicine) Estill Dooms as Social Worker  Extended Emergency Contact Information Primary Emergency Contact: Moore,Vivian Address: [address]          [place], [place] [address] [place] Phone: [phone] Work Phone: [phone] Mobile Phone: [phone] Relation: Daughter Secondary Emergency Contact: Cole,Suzette Address: [address]          [place], [place] [address] [place] Phone: [phone] Work Phone: [phone] Relation: Daughter  Code Status:  Full Code Goals of care: Advanced Directive information Advanced Directives 06/15/2017  Does Patient Have a Medical Advance Directive? Yes  Type of Advance Directive (No Data)  Does patient want to make changes to medical advance directive? No - Patient declined  Copy of [place] in Chart? No - copy requested  Would patient like information on creating a medical advance directive? -  Pre-existing out of facility DNR order (yellow form or pink MOST form) -     Chief Complaint  Patient presents with  . Hospitalization Follow-up    F/u Hospitalization Visit  Status post left reverse shoulder arthroplasty  HPI:  Pt is a 77 y.o. female seen today for a hospital f/u s/p admission for left shoulder arthroplasty secondary to end-stage rotator cuff tear arth  The procedure apparently went well and there were no postop complications-she is here for rehab.  Patient previously had a right shoulder replacement and also was in the facility for rehab.  Her other medical conditions include COPD on numerous medications including low-dose prednisone as well as nebulizers Pro Air and Symbicort.  She also has a history of compression fractures and receives Vicodin as  needed for that.   does have an extensive history of GERD is followed by GI-currently is on Nexium she does have previous history of a hiatal hernia and noncritical esophageal ring.  She also has a history of venous stasis edema she is on Demadex apparently she receives this for a week and then a week off.  Regards to anemia she does have a history of iron deficiency continues on iron hemoglobin per chart review has shown relative stability at 11.1 on lab done late last week.  Regards to anxiety she is on Xanax twice daily as needed.  She also has a history of hypertension is on an arm manual blood pressure today was 138/88.  Currently she is resting in her wheelchair comfortably has no acute complaints that she does have shoulder pain at times the Vicodin does help some.     Past Medical History:  Diagnosis Date  . Anxiety   . Back pain, chronic   . CKD (chronic kidney disease) stage 3, GFR 30-59 ml/min (HCC)   . Compression fx, thoracic spine (HCC)    T - 11  . 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[place])   . Essential hypertension   . GERD (gastroesophageal reflux disease)   . HOH (hard of hearing)   . Inflammatory arthritis   . Iron deficiency anemia 01/2012  . Pulmonary eosinophilia ([place])   . Spinal stenosis   . Tubular adenoma 11/2012   Past Surgical History:  Procedure Laterality Date  . ABDOMINAL HYSTERECTOMY    . BACK SURGERY    . CARPAL TUNNEL RELEASE Right 4/14  .  CATARACT EXTRACTION W/PHACO Left 11/19/2014   Procedure: CATARACT EXTRACTION PHACO AND INTRAOCULAR LENS PLACEMENT (IOC);  Surgeon: Williams Che, MD;  Location: [facility];  Service: Ophthalmology;  Laterality: Left;  CDE: 4.77  . CATARACT EXTRACTION W/PHACO Right 02/03/2015   Procedure: CATARACT EXTRACTION PHACO AND INTRAOCULAR LENS PLACEMENT ([place]);  Surgeon: Williams Che, MD;  Location: [facility];  Service: Ophthalmology;  Laterality: Right;  CDE: 4.54  . COLONOSCOPY  06/19/2008   RMR: tortuous and elongated colon with  scattered left-sided diverticula/colonic mucosa appeared entirely normal. Prior colonic ulcers had resolved.  . COLONOSCOPY  12/2007   Dr. Gala Romney: Scattered diffuse sigmoid diverticula, 2 areas of ulceration at the hepatic flexure. Biopsies unremarkable.  . COLONOSCOPY N/A 11/20/2012   next TCS 11/2017  . DECOMPRESSIVE LUMBAR LAMINECTOMY LEVEL 2  04/20/2012   Procedure: DECOMPRESSIVE LUMBAR LAMINECTOMY LEVEL 2;  Surgeon: Tobi Bastos, MD;  Location: [facility];  Service: Orthopedics;  Laterality: Right;  Decompressive Lumbar Laminectomy of the L4 - L5 and L5 - S1 Complete/Laminectomy L5 on the Right (X-Ray)  . ESOPHAGOGASTRODUODENOSCOPY  12/2007   Dr. 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 Small hiatal hernia. Slightly pale duodenal mucosa (biopsy unremarkable)  . ESOPHAGOGASTRODUODENOSCOPY (EGD) WITH PROPOFOL N/A 01/20/2017   Procedure: ESOPHAGOGASTRODUODENOSCOPY (EGD) WITH PROPOFOL;  Surgeon: Daneil Dolin, MD;  Location: [facility];  Service: Endoscopy;  Laterality: N/A;  12:30pm  . EYE SURGERY     BIL CATARACTS  . IR KYPHO THORACIC WITH BONE BIOPSY  02/08/2017  . IR RADIOLOGIST EVAL & MGMT  02/02/2017  . JOINT REPLACEMENT    . KNEE ARTHROSCOPY Right   . MALONEY DILATION N/A 01/20/2017   Procedure: Venia Minks DILATION;  Surgeon: Daneil Dolin, MD;  Location: [facility];  Service: Endoscopy;  Laterality: N/A;  . REVERSE SHOULDER ARTHROPLASTY Right 05/24/2014   Procedure: RIGHT SHOULDER REVERSE ARTHROPLASTY;  Surgeon: Netta Cedars, MD;  Location: [facility];  Service: Orthopedics;  Laterality: Right;  . REVERSE SHOULDER ARTHROPLASTY Left 06/10/2017   Procedure: LEFT REVERSE SHOULDER ARTHROPLASTY;  Surgeon: Netta Cedars, MD;  Location: [facility];  Service: Orthopedics;  Laterality: Left;  . TOTAL KNEE ARTHROPLASTY  01/24/2012   Procedure: TOTAL KNEE ARTHROPLASTY;  Surgeon: Gearlean Alf, MD;  Location: [facility];  Service: Orthopedics;  Laterality:  Right;  . TUBAL LIGATION      Allergies  Allergen Reactions  . Flexeril [Cyclobenzaprine] Anaphylaxis  . Other Anaphylaxis and Other (See Comments)    ALL TREE NUTS  . Keflex [Cephalexin] Itching  . Aspirin Other (See Comments)    Cannot take due to ulcers  . Statins Other (See Comments)    is pain and swelling   . Adhesive [Tape] Rash  . Chlorhexidine Rash    Outpatient Encounter Medications as of 06/15/2017  Medication Sig  . acetaminophen (TYLENOL 8 HOUR ARTHRITIS PAIN) 650 MG CR tablet Take 650-1,300 mg by mouth every 8 (eight) hours as needed for pain.  Marland Kitchen albuterol (PROAIR HFA) 108 (90 BASE) MCG/ACT inhaler Inhale 2 puffs into the lungs every 6 (six) hours as needed for wheezing or shortness of breath. For COPD  . ALPRAZolam (XANAX) 0.5 MG tablet Take 0.25-0.5 mg by mouth 2 (two) times daily as needed for anxiety.   . Benralizumab (FASENRA) 30 MG/ML SOSY Inject 30 mg into the skin every 8 (eight) weeks.   . budesonide-formoterol (SYMBICORT) 160-4.5 MCG/ACT inhaler Inhale 2 puffs into the lungs 2 (two) times daily.  Marland Kitchen  EPINEPHrine (EPIPEN 2-PAK) 0.3 mg/0.3 mL DEVI Inject 0.3 mg into the muscle daily as needed (for anaphylatic allergic reactions.).   Marland Kitchen esomeprazole (NEXIUM) 40 MG capsule Take 40 mg by mouth daily before breakfast.   . ferrous sulfate 325 (65 FE) MG tablet Take 325 mg by mouth daily with breakfast.  . fexofenadine (ALLEGRA) 180 MG tablet Take 180 mg by mouth daily.  . fluticasone (FLONASE) 50 MCG/ACT nasal spray Place 1 spray into both nostrils 2 (two) times daily.  . folic acid (FOLVITE) 1 MG tablet Take 1 mg by mouth daily.  Marland Kitchen HYDROcodone-acetaminophen (NORCO) 5-325 MG tablet Take 1-2 tablets by mouth every 6 (six) hours as needed for moderate pain or severe pain.  . hydrocortisone (ANUSOL-HC) 2.5 % rectal cream Place 1 application rectally 3 (three) times daily.  Marland Kitchen ipratropium-albuterol (DUONEB) 0.5-2.5 (3) MG/3ML SOLN Take 3 mLs by nebulization 2 (two) times  daily.   Marland Kitchen ketotifen (ZADITOR) 0.025 % ophthalmic solution Place 1 drop into both eyes 2 (two) times daily.  . Magnesium 250 MG TABS Take 250 mg by mouth daily at 12 noon.   . montelukast (SINGULAIR) 10 MG tablet Take 10 mg by mouth daily.   Marland Kitchen olmesartan (BENICAR) 40 MG tablet Take 40 mg by mouth daily.  Vladimir Faster Glycol-Propyl Glycol (SYSTANE OP) Place 1 drop into both eyes 2 (two) times daily.  . polyethylene glycol (MIRALAX) packet Take 17 g daily by mouth.  . potassium chloride (K-DUR) 10 MEQ tablet Take 10 mEq by mouth See admin instructions. Take 10 meq by mouth once daily for 7 days then off for 7 days, then restart  . predniSONE (DELTASONE) 2.5 MG tablet Take 2.5 mg by mouth daily with breakfast.  . torsemide (DEMADEX) 10 MG tablet Take 10 mg by mouth See admin instructions. Take 10 mg by mouth daily for 7 days then off for 7 days, then restart  . Turmeric 500 MG CAPS Take 500 mg by mouth daily.    No facility-administered encounter medications on file as of 06/15/2017.      Review of Systems   In general she is not complaining of any fever chills appears to be in good spirits    skin is  Not  complaining of any rashes or itching last time she was here she did have a rash thought to be contact dermatitis  Head ears eyes nose mouth and throat is not complaining of visual changes or sore throat.  Respiratory is not complaining of shortness of breath she does have a history of COPD is not complaining of any increased cough beyond baseline.  Cardiac is not complaining of any chest pain she does have some venous stasis edema bilaterally.  GI is not complaining of abdominal discomfort nausea vomiting diarrhea constipation's says she is having regular bowel movements.  GU does not complain of dysuria says she is having regular urination.  Musculoskeletal does complain of left shoulder pain status post surgery says the Vicodin does help some.  Neurologic is not complaining of  dizziness or headache says she has some minimal numbness in 1 of her left hand fingers but this is not really progressed since surgery.  Psych does not complain of being overtly anxious or depressed she is on Xanax as needed for anxiety of note manual blood pressure this morning was 138/88  Immunization History  Administered Date(s) Administered  . Influenza-Unspecified 12/13/2013   Pertinent  Health Maintenance Due  Topic Date Due  . PNA vac Low  Risk Adult (1 of 2 - PCV13) 07/15/2017 (Originally 09/21/2007)  . INFLUENZA VACCINE  10/13/2017  . MAMMOGRAM  05/12/2019  . COLONOSCOPY  11/21/2022  . DEXA SCAN  Completed   Fall Risk  03/31/2017 02/14/2017 02/10/2017 01/27/2017 12/29/2016  Falls in the past year? Yes Yes Yes Yes Yes  Number falls in past yr: 2 or more 2 or more 2 or more 2 or more 1  Injury with Fall? No No No Yes -  Risk Factor Category  High Fall Risk High Fall Risk High Fall Risk High Fall Risk -  Risk for fall due to : History of fall(s);Impaired balance/gait;Medication side effect History of fall(s);Impaired balance/gait;Impaired mobility History of fall(s);Impaired balance/gait;Impaired mobility History of fall(s);Impaired balance/gait;Impaired mobility Impaired balance/gait;Impaired mobility  Follow up Falls evaluation completed;Falls prevention discussed Falls prevention discussed Education provided;Falls prevention discussed Education provided;Falls prevention discussed Falls prevention discussed   Functional Status Survey:    Vitals:   06/15/17 1102  BP: (!) 151/95  Pulse: (!) 101  Resp: 20  Temp: 98.3 F (36.8 C)  TempSrc: Oral  --There is no height or weight on file to calculate BMI.Of note manual blood pressure this morning was 138/88--pulse was 97 she has 1+ lower extremity edema venous stasis changes Physical Exam   In general this is a pleasant elderly female in no distress sitting comfortably in her wheelchair.  Her skin is warm and dry.  Eyes sclera  and conjunctive are clear visual acuity appears grossly intact.  Oropharynx is clear mucous membranes moist.  Chest is clear to auscultation there is no labored breathing.  Heart is regular rate and rhythm without murmur gallop.  Or rub  Abdomen is somewhat obese soft nontender with positive bowel sounds.  Musculoskeletal does have her left arm in a sling status post surgery-strength appears to be intact right upper arm is able to move her lower extremities it appears with baseline strength and range of motion---limited exam since she is in wheelchair.  Radial pulses intact --left arm grip strength appears to be intact as well as capillary refill   Neurologic is grossly intact her speech is clear could not really appreciate lateralizing findings.  Psych she is alert and oriented pleasant and appropriate     No Labs reviewed: Recent Labs    02/08/17 0726 06/03/17 1022 06/11/17 0623  NA 136 136 135  K 3.3* 3.7 4.0  CL 101 104 101  CO2 26 23 23   GLUCOSE 99 100* 171*  BUN 20 12 15   CREATININE 1.55* 1.30* 1.28*  CALCIUM 9.5 9.4 9.3   Recent Labs    12/09/16 0311 01/21/17 1831  AST 29 29  ALT 33 36  ALKPHOS 52 64  BILITOT 0.7 0.5  PROT 7.1 7.4  ALBUMIN 4.1 4.2   Recent Labs    12/09/16 0311 01/13/17 0915 01/21/17 1831 02/08/17 0726 06/03/17 1022 06/11/17 0623  WBC 9.2 12.3* 10.3 7.6 9.2  --   NEUTROABS 5.4 8.4* 7.0  --   --   --   HGB 11.5* 10.9* 11.5* 10.4* 11.1* 10.1*  HCT 36.5 34.1* 36.0 34.0* 36.4 34.2*  MCV 80.9 79.1 80.4 81.0 79.3  --   PLT 262 242 239 324 295  --    Lab Results  Component Value Date   TSH 1.877 09/20/2015   No results found for: HGBA1C No results found for: CHOL, HDL, LDLCALC, LDLDIRECT, TRIG, CHOLHDL  Significant Diagnostic Results in last 30 days:  No results found.  Assessment/Plan  #  1 history of left rotator cuff repair-apparently postop course was quite unremarkable she is here for rehab will need orthopedic follow-up  as well as PT and OT currently has arm in a sling-she has Vicodin as needed for pain apparently this is helping some but will have to be monitored.  2.  History of compression fractures-again pain control will have to be watched she does have the Vicodin as needed-.  3.  History of iron deficiency anemia she is on iron hemoglobin has shown stability of 11.1 will need to monitor periodically.  4.  History of COPD she is on numerous agents including duo nebs twice daily Pro Air Symbicort and low-dose prednisone according to patient there are plans to try to titrate her off the remaining prednisone.  5.  History of GERD as noted above she has a fairly significant history here and has been followed by GI she continues on Nexium.  6.  History of hypertension is on Olmesartan-we have minimal readings so far at this point will monitor.  7.  History of allergic rhinitis she continues on Flonase as well as Allegra and Singulai  8.  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  She is also on potassium supplementation when she gets the Demadex.  9.  History of anxiety apparently this has been long-term she does have an order for Xanax twice daily as needed for 14 days.  [id number] note greater than 35 minutes spent assessing patient-reviewing her chart and labs-and coordinating and formulating a plan of care for numerous diagnoses-note greater than 50% of time spent coordinating a plan of care

## 2017-06-16 ENCOUNTER — Encounter: Payer: Self-pay | Admitting: Internal Medicine

## 2017-06-16 ENCOUNTER — Non-Acute Institutional Stay (SKILLED_NURSING_FACILITY): Payer: PPO | Admitting: Internal Medicine

## 2017-06-16 DIAGNOSIS — K219 Gastro-esophageal reflux disease without esophagitis: Secondary | ICD-10-CM

## 2017-06-16 DIAGNOSIS — J4550 Severe persistent asthma, uncomplicated: Secondary | ICD-10-CM | POA: Diagnosis not present

## 2017-06-16 DIAGNOSIS — Z96612 Presence of left artificial shoulder joint: Secondary | ICD-10-CM | POA: Diagnosis not present

## 2017-06-16 DIAGNOSIS — J449 Chronic obstructive pulmonary disease, unspecified: Secondary | ICD-10-CM

## 2017-06-16 DIAGNOSIS — N183 Chronic kidney disease, stage 3 unspecified: Secondary | ICD-10-CM

## 2017-06-16 DIAGNOSIS — I10 Essential (primary) hypertension: Secondary | ICD-10-CM

## 2017-06-16 DIAGNOSIS — Z87891 Personal history of nicotine dependence: Secondary | ICD-10-CM | POA: Diagnosis not present

## 2017-06-16 DIAGNOSIS — M1990 Unspecified osteoarthritis, unspecified site: Secondary | ICD-10-CM

## 2017-06-16 DIAGNOSIS — D721 Eosinophilia: Secondary | ICD-10-CM | POA: Diagnosis not present

## 2017-06-16 NOTE — Progress Notes (Signed)
Provider:  Veleta Miners Location:   [place] Room Number: 133/P Place of Service:  SNF (31)  PCP: Celene Squibb, MD Patient Care Team: Celene Squibb, MD as PCP - General (Internal Medicine) Estill Dooms as Social Worker  Extended Emergency Contact Information Primary Emergency Contact: Moore,Vivian Address: [place], [place] [address] [place] Phone: [phone] Work Phone: [phone] Mobile Phone: [phone] Relation: Daughter Secondary Emergency Contact: Cole,Suzette Address: [address] [place], [place] [address] [place] of [place] Phone: [phone] Work Phone: [phone] Relation: Daughter  Code Status: Full Code Goals of Care: Advanced Directive information Advanced Directives 06/16/2017  Does Patient Have a Medical Advance Directive? Yes  Type of Advance Directive (No Data)  Does patient want to make changes to medical advance directive? No - Patient declined  Copy of [place] in Chart? No - copy requested  Would patient like information on creating a medical advance directive? -  Pre-existing out of facility DNR order (yellow form or pink MOST form) -      Chief Complaint  Patient presents with  . New Admit To SNF    New Admission Visit    HPI: Patient is a 77 y.o. female seen today for admission to SNF for therapy after staying in the hospital from 03/29-04/02 For Elective Left reverse Shoulder Arthroplasty.Done on 03/29  Patient with h/o COPD and Adult Onset Asthma and Recurrent  episodes of acute resp failure having required NIPPV in the past on Fasenra and Low dose of Prednisone., Rheumatoid Arthritis, Anxiety, Microcytic Anemia , Hypertension,  Patient was admitted for Elective Left Shoulder Arthroplasty. Post op Course was uneventful.She does c/o Pain that Shoulder. She has started Therapy  And doing well with that. She did c/o Constipation  and some issues with her Hemorrhoids. She lives by Herself but has family who lives close by and help her. Was independent in ADL and IADL. Does walk with the [facility].      Past Medical History:  Diagnosis Date  . Anxiety   . Back pain, chronic   . CKD (chronic kidney disease) stage 3, GFR 30-59 ml/min (HCC)   . Compression fx, thoracic spine (HCC)    T - 11  . 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 Essential hypertension   . GERD (gastroesophageal reflux disease)   . HOH (hard of hearing)   . Inflammatory arthritis   . Iron deficiency anemia 01/2012  . Pulmonary eosinophilia ([place])   . Spinal stenosis   . Tubular adenoma 11/2012   Past Surgical History:  Procedure Laterality Date  . ABDOMINAL HYSTERECTOMY    . BACK SURGERY    . CARPAL TUNNEL RELEASE Right 4/14  . CATARACT EXTRACTION W/PHACO Left 11/19/2014   Procedure: CATARACT EXTRACTION PHACO AND INTRAOCULAR LENS PLACEMENT (IOC);  Surgeon: Williams Che, MD;  Location: [facility];  Service: Ophthalmology;  Laterality: Left;  CDE: 4.77  . CATARACT EXTRACTION W/PHACO Right 02/03/2015   Procedure: CATARACT EXTRACTION PHACO AND INTRAOCULAR LENS PLACEMENT ([place]);  Surgeon: Williams Che, MD;  Location: [facility];  Service: Ophthalmology;  Laterality: Right;  CDE: 4.54  . COLONOSCOPY  06/19/2008   RMR: tortuous and elongated colon with scattered left-sided diverticula/colonic mucosa appeared entirely normal. Prior colonic ulcers had resolved.  . COLONOSCOPY  12/2007   Dr. Gala Romney: Scattered diffuse sigmoid  diverticula, 2 areas of ulceration at the hepatic flexure. Biopsies unremarkable.  . COLONOSCOPY N/A 11/20/2012   next TCS 11/2017  . DECOMPRESSIVE LUMBAR LAMINECTOMY LEVEL 2  04/20/2012   Procedure: DECOMPRESSIVE LUMBAR LAMINECTOMY LEVEL 2;  Surgeon: Tobi Bastos, MD;  Location: [facility];  Service: Orthopedics;  Laterality: Right;  Decompressive Lumbar Laminectomy of the L4 - L5 and L5 - S1 Complete/Laminectomy L5 on the Right (X-Ray)  .  ESOPHAGOGASTRODUODENOSCOPY  12/2007   Dr. 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 Small hiatal hernia. Slightly pale duodenal mucosa (biopsy unremarkable)  . ESOPHAGOGASTRODUODENOSCOPY (EGD) WITH PROPOFOL N/A 01/20/2017   Procedure: ESOPHAGOGASTRODUODENOSCOPY (EGD) WITH PROPOFOL;  Surgeon: Daneil Dolin, MD;  Location: [facility];  Service: Endoscopy;  Laterality: N/A;  12:30pm  . EYE SURGERY     BIL CATARACTS  . IR KYPHO THORACIC WITH BONE BIOPSY  02/08/2017  . IR RADIOLOGIST EVAL & MGMT  02/02/2017  . JOINT REPLACEMENT    . KNEE ARTHROSCOPY Right   . MALONEY DILATION N/A 01/20/2017   Procedure: Venia Minks DILATION;  Surgeon: Daneil Dolin, MD;  Location: [facility];  Service: Endoscopy;  Laterality: N/A;  . REVERSE SHOULDER ARTHROPLASTY Right 05/24/2014   Procedure: RIGHT SHOULDER REVERSE ARTHROPLASTY;  Surgeon: Netta Cedars, MD;  Location: [place];  Service: Orthopedics;  Laterality: Right;  . REVERSE SHOULDER ARTHROPLASTY Left 06/10/2017   Procedure: LEFT REVERSE SHOULDER ARTHROPLASTY;  Surgeon: Netta Cedars, MD;  Location: [facility];  Service: Orthopedics;  Laterality: Left;  . TOTAL KNEE ARTHROPLASTY  01/24/2012   Procedure: TOTAL KNEE ARTHROPLASTY;  Surgeon: Gearlean Alf, MD;  [facility];  Service: Orthopedics;  Laterality: Right;  . TUBAL LIGATION      reports that she quit smoking about 25 years ago. Her smoking use included cigarettes. She has a 80.00 pack-year smoking history. She has never used smokeless tobacco. She reports that she does not drink alcohol or use drugs. Social History   Socioeconomic History  . Marital status: Single    Spouse name: Not on file  . Number of children: 3  . Years of education: Not on file  . Highest education level: Not on file  Occupational History  . Not on file  Social Needs  . Financial resource strain: Not on file  . Food insecurity:    Worry: Not on file    Inability: Not on  file  . Transportation needs:    Medical: Not on file    Non-medical: Not on file  Tobacco Use  . Smoking status: Former Smoker    Packs/day: 2.00    Years: 40.00    Pack years: 80.00    Types: Cigarettes    Last attempt to quit: 01/19/1992    Years since quitting: 25.4  . Smokeless tobacco: Never Used  Substance and Sexual Activity  . Alcohol use: No  . Drug use: No  . Sexual activity: Never  Lifestyle  . Physical activity:    Days per week: Not on file    Minutes per session: Not on file  . Stress: Not on file  Relationships  . Social connections:    Talks on phone: Not on file    Gets together: Not on file    Attends religious service: Not on file    Active member of club or organization: Not on file    Attends meetings of clubs or organizations: Not on file    Relationship status: Not on  file  . Intimate partner violence:    Fear of current or ex partner: Not on file    Emotionally abused: Not on file    Physically abused: Not on file    Forced sexual activity: Not on file  Other Topics Concern  . Not on file  Social History Narrative  . Not on file    Functional Status Survey:    Family History  Problem Relation Age of Onset  . Breast cancer Sister   . Colon cancer Sister        two deceased, one living and terminal, one current undergoing treatment, ages 86, 39, 78, 65  . Hypertension Mother   . Stroke Mother   . Heart attack Mother   . Hypertension Father   . Prostate cancer Father     Health Maintenance  Topic Date Due  . TETANUS/TDAP  07/15/2017 (Originally 09/20/1959)  . PNA vac Low Risk Adult (1 of 2 - PCV13) 07/15/2017 (Originally 09/21/2007)  . INFLUENZA VACCINE  10/13/2017  . MAMMOGRAM  05/12/2019  . COLONOSCOPY  11/21/2022  . DEXA SCAN  Completed    Allergies  Allergen Reactions  . Flexeril [Cyclobenzaprine] Anaphylaxis  . Other Anaphylaxis and Other (See Comments)    ALL TREE NUTS  . Keflex [Cephalexin] Itching  . Aspirin Other (See  Comments)    Cannot take due to ulcers  . Statins Other (See Comments)    is pain and swelling   . Adhesive [Tape] Rash  . Chlorhexidine Rash    Outpatient Encounter Medications as of 06/16/2017  Medication Sig  . acetaminophen (TYLENOL 8 HOUR ARTHRITIS PAIN) 650 MG CR tablet Take 650-1,300 mg by mouth every 8 (eight) hours as needed for pain.  Marland Kitchen albuterol (PROAIR HFA) 108 (90 BASE) MCG/ACT inhaler Inhale 2 puffs into the lungs every 6 (six) hours as needed for wheezing or shortness of breath. For COPD  . ALPRAZolam (XANAX) 0.5 MG tablet Take 0.25-0.5 mg by mouth 2 (two) times daily as needed for anxiety.   . Benralizumab (FASENRA) 30 MG/ML SOSY Inject 30 mg into the skin every 8 (eight) weeks.   . budesonide-formoterol (SYMBICORT) 160-4.5 MCG/ACT inhaler Inhale 2 puffs into the lungs 2 (two) times daily.  Marland Kitchen EPINEPHrine (EPIPEN 2-PAK) 0.3 mg/0.3 mL DEVI Inject 0.3 mg into the muscle daily as needed (for anaphylatic allergic reactions.).   Marland Kitchen esomeprazole (NEXIUM) 40 MG capsule Take 40 mg by mouth daily before breakfast.   . ferrous sulfate 325 (65 FE) MG tablet Take 325 mg by mouth daily with breakfast.  . fexofenadine (ALLEGRA) 180 MG tablet Take 180 mg by mouth daily.  . fluticasone (FLONASE) 50 MCG/ACT nasal spray Place 1 spray into both nostrils 2 (two) times daily.  . folic acid (FOLVITE) 1 MG tablet Take 1 mg by mouth daily.  Marland Kitchen HYDROcodone-acetaminophen (NORCO) 5-325 MG tablet Take 1-2 tablets by mouth every 6 (six) hours as needed for moderate pain or severe pain.  . hydrocortisone (ANUSOL-HC) 2.5 % rectal cream Place 1 application rectally 3 (three) times daily.  Marland Kitchen ipratropium-albuterol (DUONEB) 0.5-2.5 (3) MG/3ML SOLN Take 3 mLs by nebulization 2 (two) times daily.   Marland Kitchen ketotifen (ZADITOR) 0.025 % ophthalmic solution Place 1 drop into both eyes 2 (two) times daily.  . Magnesium 250 MG TABS Take 250 mg by mouth daily at 12 noon.   . montelukast (SINGULAIR) 10 MG tablet Take 10 mg by  mouth daily.   Marland Kitchen olmesartan (BENICAR) 40 MG tablet Take 40  mg by mouth daily.  Vladimir Faster Glycol-Propyl Glycol (SYSTANE OP) Place 1 drop into both eyes 2 (two) times daily.  . polyethylene glycol (MIRALAX) packet Take 17 g daily by mouth.  . potassium chloride (K-DUR) 10 MEQ tablet Take 10 mEq by mouth See admin instructions. Take 10 meq by mouth once daily for 7 days then off for 7 days, then restart  . predniSONE (DELTASONE) 2.5 MG tablet Take 2.5 mg by mouth daily with breakfast.  . torsemide (DEMADEX) 10 MG tablet Take 10 mg by mouth See admin instructions. Take 10 mg by mouth daily for 7 days then off for 7 days, then restart  . Turmeric 500 MG CAPS Take 500 mg by mouth daily.    No facility-administered encounter medications on file as of 06/16/2017.      Review of Systems  Constitutional: Negative.   HENT: Negative.   Respiratory: Negative.   Gastrointestinal: Positive for constipation.  Genitourinary: Negative.   Musculoskeletal: Positive for neck pain and neck stiffness.  Skin: Negative.   Neurological: Negative.   Psychiatric/Behavioral: Positive for sleep disturbance.    Vitals:   06/16/17 1004  BP: 124/76  Pulse: (!) 111  Resp: 20  Temp: 98.3 F (36.8 C)  TempSrc: Oral   There is no height or weight on file to calculate BMI. Physical Exam  Constitutional: She is oriented to person, place, and time. She appears well-developed and well-nourished.  HENT:  Head: Normocephalic.  Mouth/Throat: Oropharynx is clear and moist.  Eyes: Pupils are equal, round, and reactive to light.  Neck: Neck supple.  Cardiovascular: Normal rate, regular rhythm and normal heart sounds.  No murmur heard. Pulmonary/Chest: Effort normal and breath sounds normal. No respiratory distress. She has no wheezes. She has no rales.  Abdominal: Soft. Bowel sounds are normal. She exhibits no distension. There is no tenderness. There is no rebound.  Musculoskeletal: She exhibits no edema.    Lymphadenopathy:    She has no cervical adenopathy.  Neurological: She is alert and oriented to person, place, and time.  No Focal deficits  Skin: Skin is warm and dry.  Psychiatric: She has a normal mood and affect. Her behavior is normal. Thought content normal.    Labs reviewed: Basic Metabolic Panel: Recent Labs    02/08/17 0726 06/03/17 1022 06/11/17 0623  NA 136 136 135  K 3.3* 3.7 4.0  CL 101 104 101  CO2 26 23 23   GLUCOSE 99 100* 171*  BUN 20 12 15   CREATININE 1.55* 1.30* 1.28*  CALCIUM 9.5 9.4 9.3   Liver Function Tests: Recent Labs    12/09/16 0311 01/21/17 1831  AST 29 29  ALT 33 36  ALKPHOS 52 64  BILITOT 0.7 0.5  PROT 7.1 7.4  ALBUMIN 4.1 4.2   Recent Labs    12/09/16 0311 01/21/17 1831  LIPASE 23 29   No results for input(s): AMMONIA in the last 8760 hours. CBC: Recent Labs    12/09/16 0311 01/13/17 0915 01/21/17 1831 02/08/17 0726 06/03/17 1022 06/11/17 0623  WBC 9.2 12.3* 10.3 7.6 9.2  --   NEUTROABS 5.4 8.4* 7.0  --   --   --   HGB 11.5* 10.9* 11.5* 10.4* 11.1* 10.1*  HCT 36.5 34.1* 36.0 34.0* 36.4 34.2*  MCV 80.9 79.1 80.4 81.0 79.3  --   PLT 262 242 239 324 295  --    Cardiac Enzymes: No results for input(s): CKTOTAL, CKMB, CKMBINDEX, TROPONINI in the last 8760 hours. BNP: Invalid  input(s): POCBNP No results found for: HGBA1C Lab Results  Component Value Date   TSH 1.877 09/20/2015   Lab Results  Component Value Date   [id number] 893 02/09/2011   Lab Results  Component Value Date   FOLATE 16.8 02/09/2011   Lab Results  Component Value Date   IRON 37.0 05/18/2012   TIBC 401.0 05/18/2012   FERRITIN 51 02/09/2011    Imaging and Procedures obtained prior to SNF admission: No results found.  Assessment/Plan  Essential hypertension Patient on olmesartan. 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  she says that she has been doing very well since she started this a year ago Patient continues to be on low dose of prednisone.and Allegra, Symbicort and Singulair  S/P Left Shoulder Arthroplasty Will start her on Robaxin Continue hydrocodone as needed  therapy started  follow-up with Orthopedics GERD On Nexium  History of microcytic anemia On chronic iron  CKD (chronic kidney disease) stage 3, Creat at baseline Disposition Patient will be discharged home    Family/ staff Communication:   Labs/tests ordered: Follow Up Labs Total time spent in this patient care encounter was 45_ minutes; greater than 50% of the visit spent counseling patient, reviewing records , Labs and coordinating care for problems addressed at this encounter.

## 2017-06-20 ENCOUNTER — Encounter (HOSPITAL_COMMUNITY)
Admission: RE | Admit: 2017-06-20 | Discharge: 2017-06-20 | Disposition: A | Discharge status: Home / Self Care | Payer: PPO | Source: Skilled Nursing Facility | Attending: Internal Medicine | Admitting: Internal Medicine

## 2017-06-20 ENCOUNTER — Other Ambulatory Visit: Payer: Self-pay

## 2017-06-20 ENCOUNTER — Ambulatory Visit: Payer: Self-pay | Admitting: *Deleted

## 2017-06-20 ENCOUNTER — Other Ambulatory Visit: Payer: Self-pay | Admitting: *Deleted

## 2017-06-20 DIAGNOSIS — Z4789 Encounter for other orthopedic aftercare: Secondary | ICD-10-CM | POA: Insufficient documentation

## 2017-06-20 LAB — CBC
HCT: 33.9 % — ABNORMAL LOW (ref 36.0–46.0)
Hemoglobin: 10.1 g/dL — ABNORMAL LOW (ref 12.0–15.0)
MCH: 23.7 pg — ABNORMAL LOW (ref 26.0–34.0)
MCHC: 29.8 g/dL — AB (ref 30.0–36.0)
MCV: 79.6 fL (ref 78.0–100.0)
PLATELETS: 370 10*3/uL (ref 150–400)
RBC: 4.26 MIL/uL (ref 3.87–5.11)
RDW: 15.1 % (ref 11.5–15.5)
WBC: 10 10*3/uL (ref 4.0–10.5)

## 2017-06-20 LAB — BASIC METABOLIC PANEL
Anion gap: 11 (ref 5–15)
BUN: 20 mg/dL (ref 6–20)
CALCIUM: 9.3 mg/dL (ref 8.9–10.3)
CO2: 26 mmol/L (ref 22–32)
CREATININE: 1.19 mg/dL — AB (ref 0.44–1.00)
Chloride: 100 mmol/L — ABNORMAL LOW (ref 101–111)
GFR, EST AFRICAN AMERICAN: 51 mL/min — AB (ref >60)
GFR, EST NON AFRICAN AMERICAN: 44 mL/min — AB (ref >60)
Glucose, Bld: 99 mg/dL (ref 65–99)
Potassium: 4.6 mmol/L (ref 3.5–5.1)
Sodium: 137 mmol/L (ref 135–145)

## 2017-06-20 MED ORDER — HYDROCODONE-ACETAMINOPHEN 5-325 MG PO TABS: 1.0000 | ORAL_TABLET | Freq: Four times a day (QID) | ORAL | 0 refills | Status: DC | PRN

## 2017-06-20 NOTE — Telephone Encounter (Signed)
RX Fax for [facility]@ [phone]  

## 2017-06-20 NOTE — Patient Outreach (Signed)
[place] [facility]) Care Management  06/20/2017  [facility]enda [facility] 06/26/1940 [id number]   CSW had received referral from [place], Marthenia Rolling that patient was admitted to [facility] SNF on 06/14/17. Patient was active with [facility] RNCM, Jacqlyn Larsen prior to hospitalization. CSW met with patient at [facility] SNF this afternoon and reviewed discharge plans to return home alone with help from her son who lives in [place] but is retired and plans to stay with her to help with the transition home. Patient also has 2 very supportive daughters that live near by and grandchildren as well. Patient states that she has a follow-up appointment with Dr. Veverly Fells on Thursday which her daughter is going to transport her to. Patient reports that she has been doing well with therapy and though she is looking forward to going home, is not anxious about leaving yet.   CSW provided patient with CSW's business card and encouraged her to call once discharge date has been determined, otherwise CSW will follow-up in 2 weeks.    Raynaldo Opitz, LCSW [facility]  Clinical Social Worker cell #: [phone]

## 2017-06-23 DIAGNOSIS — Z471 Aftercare following joint replacement surgery: Secondary | ICD-10-CM | POA: Diagnosis not present

## 2017-06-23 DIAGNOSIS — Z96612 Presence of left artificial shoulder joint: Secondary | ICD-10-CM | POA: Diagnosis not present

## 2017-06-24 ENCOUNTER — Other Ambulatory Visit: Payer: Self-pay | Admitting: *Deleted

## 2017-06-24 NOTE — Patient Outreach (Signed)
[facility]) Care Management  06/24/2017  Glenda Terry 09/06/1940 [id number]   CSW received call from patient informing that she plans to discharge home from [facility] tomorrow - Sat, 4/13. CSW will make [facility] RNCM referral for transition of care. [facility] consent signed 02/10/15 with [name], [facility] LCSW & patient has Advance Directives [facility] & living will) completed, notarized & scanned in 11/12/14. No further CSW need identified at this time, CSW will sign off.    Raynaldo Opitz, LCSW [facility]  Clinical Social Worker cell #: [phone]

## 2017-06-27 ENCOUNTER — Other Ambulatory Visit: Payer: Self-pay | Admitting: *Deleted

## 2017-06-27 DIAGNOSIS — R062 Wheezing: Secondary | ICD-10-CM | POA: Diagnosis not present

## 2017-06-27 DIAGNOSIS — G8929 Other chronic pain: Secondary | ICD-10-CM | POA: Diagnosis not present

## 2017-06-27 DIAGNOSIS — R7989 Other specified abnormal findings of blood chemistry: Secondary | ICD-10-CM | POA: Diagnosis not present

## 2017-06-27 DIAGNOSIS — J45998 Other asthma: Secondary | ICD-10-CM | POA: Diagnosis not present

## 2017-06-27 DIAGNOSIS — Z471 Aftercare following joint replacement surgery: Secondary | ICD-10-CM | POA: Diagnosis not present

## 2017-06-27 DIAGNOSIS — E249 Cushing's syndrome, unspecified: Secondary | ICD-10-CM | POA: Diagnosis not present

## 2017-06-27 DIAGNOSIS — J82 Pulmonary eosinophilia, not elsewhere classified: Secondary | ICD-10-CM | POA: Diagnosis not present

## 2017-06-27 DIAGNOSIS — Z7952 Long term (current) use of systemic steroids: Secondary | ICD-10-CM | POA: Diagnosis not present

## 2017-06-27 DIAGNOSIS — Z96611 Presence of right artificial shoulder joint: Secondary | ICD-10-CM | POA: Diagnosis not present

## 2017-06-27 DIAGNOSIS — R51 Headache: Secondary | ICD-10-CM | POA: Diagnosis not present

## 2017-06-27 DIAGNOSIS — R600 Localized edema: Secondary | ICD-10-CM | POA: Diagnosis not present

## 2017-06-27 DIAGNOSIS — R296 Repeated falls: Secondary | ICD-10-CM | POA: Diagnosis not present

## 2017-06-27 DIAGNOSIS — K219 Gastro-esophageal reflux disease without esophagitis: Secondary | ICD-10-CM | POA: Diagnosis not present

## 2017-06-27 DIAGNOSIS — J449 Chronic obstructive pulmonary disease, unspecified: Secondary | ICD-10-CM | POA: Diagnosis not present

## 2017-06-27 DIAGNOSIS — R531 Weakness: Secondary | ICD-10-CM | POA: Diagnosis not present

## 2017-06-27 DIAGNOSIS — N183 Chronic kidney disease, stage 3 (moderate): Secondary | ICD-10-CM | POA: Diagnosis not present

## 2017-06-27 DIAGNOSIS — I129 Hypertensive chronic kidney disease with stage 1 through stage 4 chronic kidney disease, or unspecified chronic kidney disease: Secondary | ICD-10-CM | POA: Diagnosis not present

## 2017-06-27 DIAGNOSIS — D509 Iron deficiency anemia, unspecified: Secondary | ICD-10-CM | POA: Diagnosis not present

## 2017-06-27 NOTE — Patient Outreach (Signed)
Telephone call to pt for transition of care week 1, pt discharged from SNF on 06/25/17 post left shoulder arthroplasty, pt has history HTN, CKD stage 3, asthma, back pain/ fractures, spoke with pt, HIPAA verified, pt reports she "feels better than I did"   Reports family and friends checking on her and providing transportation, pt has meals frozen in advance, reports taking all medications as prescribed, states pain is under control at present, pt is working with home health RN and OT.  Pt agreeable to home visit week of 4/29 due to a lot of people coming in and out of her home.  RN CM faxed transition of care note to primary MD Dr. [name] Crane.  Outpatient Encounter Medications as of 06/27/2017  Medication Sig  . acetaminophen (TYLENOL 8 HOUR ARTHRITIS PAIN) 650 MG CR tablet Take 650-1,300 mg by mouth every 8 (eight) hours as needed for pain.  Marland Kitchen albuterol (PROAIR HFA) 108 (90 BASE) MCG/ACT inhaler Inhale 2 puffs into the lungs every 6 (six) hours as needed for wheezing or shortness of breath. For COPD  . ALPRAZolam (XANAX) 0.5 MG tablet Take 0.25-0.5 mg by mouth 2 (two) times daily as needed for anxiety.   . Benralizumab (FASENRA) 30 MG/ML SOSY Inject 30 mg into the skin every 8 (eight) weeks.   . budesonide-formoterol (SYMBICORT) 160-4.5 MCG/ACT inhaler Inhale 2 puffs into the lungs 2 (two) times daily.  Marland Kitchen EPINEPHrine (EPIPEN 2-PAK) 0.3 mg/0.3 mL DEVI Inject 0.3 mg into the muscle daily as needed (for anaphylatic allergic reactions.).   Marland Kitchen esomeprazole (NEXIUM) 40 MG capsule Take 40 mg by mouth daily before breakfast.   . ferrous sulfate 325 (65 FE) MG tablet Take 325 mg by mouth daily with breakfast.  . fexofenadine (ALLEGRA) 180 MG tablet Take 180 mg by mouth daily.  . fluticasone (FLONASE) 50 MCG/ACT nasal spray Place 1 spray into both nostrils 2 (two) times daily.  . folic acid (FOLVITE) 1 MG tablet Take 1 mg by mouth daily.  Marland Kitchen HYDROcodone-acetaminophen (NORCO) 5-325 MG tablet Take 1-2 tablets by mouth  every 6 (six) hours as needed for moderate pain or severe pain.  . hydrocortisone (ANUSOL-HC) 2.5 % rectal cream Place 1 application rectally 3 (three) times daily.  Marland Kitchen ipratropium-albuterol (DUONEB) 0.5-2.5 (3) MG/3ML SOLN Take 3 mLs by nebulization 2 (two) times daily.   Marland Kitchen ketotifen (ZADITOR) 0.025 % ophthalmic solution Place 1 drop into both eyes 2 (two) times daily.  . Magnesium 250 MG TABS Take 250 mg by mouth daily at 12 noon.   . montelukast (SINGULAIR) 10 MG tablet Take 10 mg by mouth daily.   Marland Kitchen olmesartan (BENICAR) 40 MG tablet Take 40 mg by mouth daily.  Vladimir Faster Glycol-Propyl Glycol (SYSTANE OP) Place 1 drop into both eyes 2 (two) times daily.  . polyethylene glycol (MIRALAX) packet Take 17 g daily by mouth.  . potassium chloride (K-DUR) 10 MEQ tablet Take 10 mEq by mouth See admin instructions. Take 10 meq by mouth once daily for 7 days then off for 7 days, then restart  . predniSONE (DELTASONE) 2.5 MG tablet Take 2.5 mg by mouth daily with breakfast.  . torsemide (DEMADEX) 10 MG tablet Take 10 mg by mouth See admin instructions. Take 10 mg by mouth daily for 7 days then off for 7 days, then restart  . Turmeric 500 MG CAPS Take 500 mg by mouth daily.    No facility-administered encounter medications on file as of 06/27/2017.    [place]  Problem One     Most Recent Value  Care Plan Problem One  Recent left shoulder arthroplasty  Role Documenting the Problem One  Care Management Coordinator  Care Plan for Problem One  Active  [facility] Long Term Goal   Pt will verbalize/ demonstrate improved self care at home related to shoulder surgery within 60 days  [facility] Long Term Goal Start Date  06/27/17  Interventions for Problem One Long Term Goal  RN CM reviewed discharge instructions, activity restrictions, follow up appointments, verified pt is working with home health RN and OT  [facility] CM Short Term Goal #1   pt will verbalize improvement in pain within 30 days  [facility] CM Short Term Goal #1  Start Date  06/27/17  Interventions for Short Term Goal #1  Pt reports she is taking pain medication as prescribed, RN CM reviewed side effects of narcotic pain medication, reviewed other pain management strategies such as relaxation.  [facility] CM Short Term Goal #2   Pt will show progression with healing related to shoulder surgery within 30 days aeb no infection, showing improvement working with OT  [facility] CM Short Term Goal #2 Start Date  06/27/17  Interventions for Short Term Goal #2  RN CM encouraged pt to work closely with OT and be adherent to any exercises prescribed as well as activity restrictions,      PLAN  See pt for initial home visit week of 07/11/17  Jacqlyn Larsen [facility], [place] Coordinator [phone]

## 2017-06-29 DIAGNOSIS — R600 Localized edema: Secondary | ICD-10-CM | POA: Diagnosis not present

## 2017-06-29 DIAGNOSIS — M1990 Unspecified osteoarthritis, unspecified site: Secondary | ICD-10-CM | POA: Diagnosis not present

## 2017-06-29 DIAGNOSIS — J441 Chronic obstructive pulmonary disease with (acute) exacerbation: Secondary | ICD-10-CM | POA: Diagnosis not present

## 2017-06-29 DIAGNOSIS — J45998 Other asthma: Secondary | ICD-10-CM | POA: Diagnosis not present

## 2017-07-04 ENCOUNTER — Telehealth (HOSPITAL_COMMUNITY): Payer: Self-pay | Admitting: Internal Medicine

## 2017-07-04 ENCOUNTER — Other Ambulatory Visit: Payer: Self-pay | Admitting: *Deleted

## 2017-07-04 ENCOUNTER — Ambulatory Visit: Payer: Self-pay | Admitting: *Deleted

## 2017-07-04 NOTE — Patient Outreach (Signed)
Telephone call to pt for transition of care week 2, spoke with pt, HIPAA verified, pt reports she is feeling better, attending all appointments, working with home health, has all medications and taking as prescribed.  No new concerns voiced.  [facility] CM Care Plan Problem One     Most Recent Value  Care Plan Problem One  Recent left shoulder arthroplasty  Role Documenting the Problem One  Care Management Coordinator  Care Plan for Problem One  Active  [facility] Long Term Goal   Pt will verbalize/ demonstrate improved self care at home related to shoulder surgery within 60 days  [facility] Long Term Goal Start Date  06/27/17  Interventions for Problem One Long Term Goal  Pt reports she is still working with home health RN, PT, OT and may be going to outpatient therapy in the near future, pt states she is completing prescribed exercises.  [facility] CM Short Term Goal #1   pt will verbalize improvement in pain within 30 days  [facility] CM Short Term Goal #1 Start Date  06/27/17  Interventions for Short Term Goal #1  Pt reports "pain is more managable"   taking pain medication as prescribed.  [facility] CM Short Term Goal #2   Pt will show progression with healing related to shoulder surgery within 30 days aeb no infection, showing improvement working with OT  [facility] CM Short Term Goal #2 Start Date  06/27/17  Interventions for Short Term Goal #2  Pt states she is following instructions provided by home health and is working on plan of care with them.      PLAN Continue weekly transition of care calls See pt for home visit next week  Jacqlyn Larsen [facility], [place] Coordinator [phone]

## 2017-07-04 NOTE — Telephone Encounter (Signed)
07/04/17  pt receiving home health with [facility] and wants [place] to call her around middle of May to see if she needs PT here

## 2017-07-08 ENCOUNTER — Encounter

## 2017-07-11 ENCOUNTER — Ambulatory Visit (HOSPITAL_COMMUNITY): Payer: PPO

## 2017-07-11 ENCOUNTER — Encounter (HOSPITAL_COMMUNITY): Payer: Self-pay

## 2017-07-12 ENCOUNTER — Other Ambulatory Visit: Payer: Self-pay | Admitting: *Deleted

## 2017-07-12 ENCOUNTER — Encounter: Payer: Self-pay | Admitting: *Deleted

## 2017-07-12 NOTE — Patient Outreach (Signed)
[facility]) Care Management   07/12/2017  Glenda Terry Apr 29, 1940 [id number]  Glenda Terry is an 77 y.o. female  Subjective: Initial home visit with pt, HIPAA verified, pt reports she is working with OT, PT and " I'm so much stronger, my pain in left shoulder better"  Pt states her family continues to assist her with transportation, grocery shopping, and any other needs she has.  Pt states her nebulizer "only about halfway works" and in the process of getting new nebulizer, pt states she has contacted MD for prescription (pulmonary), her insurance company and [employer] and hopes to have the machine within 1-2 days.  Objective:   Vitals:   07/12/17 1230  BP: 130/62  Pulse: 88  Resp: 16  SpO2: 98%  Weight: 190 lb (86.2 kg)  Height: 1.524 m (5')   ROS  Physical Exam  Constitutional: She is oriented to person, place, and time. She appears well-developed and well-nourished.  HENT:  Head: Normocephalic.  Neck: Normal range of motion. Neck supple.  Cardiovascular: Normal rate.  Respiratory: Effort normal and breath sounds normal.  Dyspnea with exertion  GI: Soft. Bowel sounds are normal.  Musculoskeletal: Normal range of motion. She exhibits no edema.  Neurological: She is alert and oriented to person, place, and time.  Skin: Skin is warm and dry.  Incision to left upper inner arm area is well approximated, no signs/ symptoms infection  Psychiatric: She has a normal mood and affect. Her behavior is normal. Judgment and thought content normal.    Encounter Medications:   Outpatient Encounter Medications as of 07/12/2017  Medication Sig  . acetaminophen (TYLENOL 8 HOUR ARTHRITIS PAIN) 650 MG CR tablet Take 650-1,300 mg by mouth every 8 (eight) hours as needed for pain.  Marland Kitchen albuterol (PROAIR HFA) 108 (90 BASE) MCG/ACT inhaler Inhale 2 puffs into the lungs every 6 (six) hours as needed for wheezing or shortness of breath. For COPD  . ALPRAZolam  (XANAX) 0.5 MG tablet Take 0.25-0.5 mg by mouth 2 (two) times daily as needed for anxiety.   . Benralizumab (FASENRA) 30 MG/ML SOSY Inject 30 mg into the skin every 8 (eight) weeks.   Marland Kitchen EPINEPHrine (EPIPEN 2-PAK) 0.3 mg/0.3 mL DEVI Inject 0.3 mg into the muscle daily as needed (for anaphylatic allergic reactions.).   Marland Kitchen esomeprazole (NEXIUM) 40 MG capsule Take 40 mg by mouth daily before breakfast.   . ferrous sulfate 325 (65 FE) MG tablet Take 325 mg by mouth daily with breakfast.  . fexofenadine (ALLEGRA) 180 MG tablet Take 180 mg by mouth daily.  . fluticasone (FLONASE) 50 MCG/ACT nasal spray Place 1 spray into both nostrils 2 (two) times daily.  . folic acid (FOLVITE) 1 MG tablet Take 1 mg by mouth daily.  . hydrocortisone (ANUSOL-HC) 2.5 % rectal cream Place 1 application rectally 3 (three) times daily.  Marland Kitchen ipratropium-albuterol (DUONEB) 0.5-2.5 (3) MG/3ML SOLN Take 3 mLs by nebulization 2 (two) times daily.   Marland Kitchen ketotifen (ZADITOR) 0.025 % ophthalmic solution Place 1 drop into both eyes 2 (two) times daily.  . Magnesium 250 MG TABS Take 250 mg by mouth daily at 12 noon.   . montelukast (SINGULAIR) 10 MG tablet Take 10 mg by mouth daily.   Marland Kitchen olmesartan (BENICAR) 40 MG tablet Take 40 mg by mouth daily.  Vladimir Faster Glycol-Propyl Glycol (SYSTANE OP) Place 1 drop into both eyes 2 (two) times daily.  . polyethylene glycol (MIRALAX) packet Take 17 g daily by mouth.  Marland Kitchen  potassium chloride (K-DUR) 10 MEQ tablet Take 10 mEq by mouth See admin instructions. Take 10 meq by mouth once daily for 7 days then off for 7 days, then restart  . predniSONE (DELTASONE) 2.5 MG tablet Take 2.5 mg by mouth daily with breakfast.  . torsemide (DEMADEX) 10 MG tablet Take 10 mg by mouth See admin instructions. Take 10 mg by mouth daily for 7 days then off for 7 days, then restart  . Turmeric 500 MG CAPS Take 500 mg by mouth daily.   . budesonide-formoterol (SYMBICORT) 160-4.5 MCG/ACT inhaler Inhale 2 puffs into the lungs  2 (two) times daily.  Marland Kitchen HYDROcodone-acetaminophen (NORCO) 5-325 MG tablet Take 1-2 tablets by mouth every 6 (six) hours as needed for moderate pain or severe pain. (Patient not taking: Reported on 07/12/2017)   No facility-administered encounter medications on file as of 07/12/2017.     Functional Status:   In your present state of health, do you have any difficulty performing the following activities: 07/12/2017 06/14/2017  Hearing? N N  Comment - -  Vision? N Y  Comment - -  Difficulty concentrating or making decisions? N N  Walking or climbing stairs? N N  Dressing or bathing? Tempie Donning  Comment difficulty putting on bra and pulling up pants,  takes extra time -  Doing errands, shopping? Tempie Donning  Preparing Food and eating ? N -  Using the Toilet? N -  In the past six months, have you accidently leaked urine? Y -  Do you have problems with loss of bowel control? N -  Managing your Medications? N -  Managing your Finances? N -  Housekeeping or managing your Housekeeping? Y -  Some recent data might be hidden    Fall/Depression Screening:    Fall Risk  07/12/2017 03/31/2017 02/14/2017  Falls in the past year? Yes Yes Yes  Number falls in past yr: 2 or more 2 or more 2 or more  Injury with Fall? Yes No No  Risk Factor Category  High Fall Risk High Fall Risk High Fall Risk  Risk for fall due to : History of fall(s);Medication side effect History of fall(s);Impaired balance/gait;Medication side effect History of fall(s);Impaired balance/gait;Impaired mobility  Follow up Falls evaluation completed;Falls prevention discussed Falls evaluation completed;Falls prevention discussed Falls prevention discussed   PHQ 2/9 Scores 07/12/2017 02/14/2017 02/10/2017 01/27/2017 12/29/2016 10/03/2015 04/21/2015  PHQ - 2 Score 0 2 0 5 0 1 2  PHQ- 9 Score - 5 - 7 - - 6    Assessment:  RN CM observed and reviewed medications with pt, faxed initial home visit and barrier letter to primary MD Dr. [name] Crane.  RN CM ask pt to  contact RN CM if any difficulty with obtaining nebulizer machine, pt verbalizes understanding and cites she is able to make phone calls "and get this taken care of"  [facility] CM Care Plan Problem One     Most Recent Value  Care Plan Problem One  Recent left shoulder arthroplasty  Role Documenting the Problem One  Care Management [place] for Problem One  Active  THN Long Term Goal   Pt will verbalize/ demonstrate improved self care at home related to shoulder surgery within 60 days  THN Long Term Goal Start Date  06/27/17  Interventions for Problem One Long Term Goal  RN CM completed falls assessment, reviewed importance of being mindful of activity restrictions, and following plan of care with PT, OT  [facility] CM Short Term  Goal #1   pt will verbalize improvement in pain within 30 days  THN CM Short Term Goal #1 Start Date  06/27/17  Interventions for Short Term Goal #1  RN CM faxed today's note to primary care MD with back pain noted pt rates 7-8 (chronic), pain to left shoulder is much better controlled, reviewed relaxation techniques, taking anti anxiety medication as needed.  THN CM Short Term Goal #2   Pt will show progression with healing related to shoulder surgery within 30 days aeb no infection, showing improvement working with OT  [facility] CM Short Term Goal #2 Start Date  06/27/17  Interventions for Short Term Goal #2  RN CM observed left upper inner arm incision which is well approximated, reviewed signs/ symptoms infection.      Plan: see pt for home visit next month  Jacqlyn Larsen [facility], [place] Coordinator [phone]

## 2017-07-18 DIAGNOSIS — R7989 Other specified abnormal findings of blood chemistry: Secondary | ICD-10-CM | POA: Diagnosis not present

## 2017-07-18 DIAGNOSIS — N183 Chronic kidney disease, stage 3 (moderate): Secondary | ICD-10-CM | POA: Diagnosis not present

## 2017-07-19 ENCOUNTER — Other Ambulatory Visit: Payer: Self-pay | Admitting: *Deleted

## 2017-07-19 NOTE — Patient Outreach (Signed)
Telephone call to pt for transition of care week 4, pt reports she did receive nebulizer machine and it is working without problems, pt continues working with home health PT, OT and completing prescribed exercises.  Pt attending all appointments and reports taking medication as prescribed.  THN CM Care Plan Problem One     Most Recent Value  Care Plan Problem One  Recent left shoulder arthroplasty  Role Documenting the Problem One  Care Management Coordinator  Care Plan for Problem One  Active  THN Long Term Goal   Pt will verbalize/ demonstrate improved self care at home related to shoulder surgery within 60 days  THN Long Term Goal Start Date  06/27/17  Interventions for Problem One Long Term Goal  RN CM reviewed with pt, bloodwork yesterday completed and pt to see primary care MD 07/20/17, orthopedic 07/26/17, eye exam 07/21/17, pt reports she has all medications and taking as prescribed, continues to work with home health PT, OT  [facility] CM Short Term Goal #1   pt will verbalize improvement in pain within 30 days  THN CM Short Term Goal #1 Start Date  06/27/17  Interventions for Short Term Goal #1  Pt has back pain today, denies shoulder pain, RN CM reviewed importance of relaxation, pt is using voltaren gel,  Rn CM ask pt to discuss pain issues with primary MD at tomorrow's appointment, pt states will discuss with MD.  [facility] CM Short Term Goal #2   Pt will show progression with healing related to shoulder surgery within 30 days aeb no infection, showing improvement working with OT  [facility] CM Short Term Goal #2 Start Date  06/27/17  Interventions for Short Term Goal #2  Pt states post op incision " looks good"   no issues reported      PLAN See pt for home visit this month  Jacqlyn Larsen [facility] [place] [phone]

## 2017-07-20 DIAGNOSIS — R7301 Impaired fasting glucose: Secondary | ICD-10-CM | POA: Diagnosis not present

## 2017-07-20 DIAGNOSIS — I10 Essential (primary) hypertension: Secondary | ICD-10-CM | POA: Diagnosis not present

## 2017-07-20 DIAGNOSIS — R600 Localized edema: Secondary | ICD-10-CM | POA: Diagnosis not present

## 2017-07-20 DIAGNOSIS — N183 Chronic kidney disease, stage 3 (moderate): Secondary | ICD-10-CM | POA: Diagnosis not present

## 2017-07-20 DIAGNOSIS — J45998 Other asthma: Secondary | ICD-10-CM | POA: Diagnosis not present

## 2017-07-20 DIAGNOSIS — E6609 Other obesity due to excess calories: Secondary | ICD-10-CM | POA: Diagnosis not present

## 2017-07-20 DIAGNOSIS — M069 Rheumatoid arthritis, unspecified: Secondary | ICD-10-CM | POA: Diagnosis not present

## 2017-07-20 DIAGNOSIS — Z6837 Body mass index (BMI) 37.0-37.9, adult: Secondary | ICD-10-CM | POA: Diagnosis not present

## 2017-07-20 DIAGNOSIS — J441 Chronic obstructive pulmonary disease with (acute) exacerbation: Secondary | ICD-10-CM | POA: Diagnosis not present

## 2017-07-20 DIAGNOSIS — D509 Iron deficiency anemia, unspecified: Secondary | ICD-10-CM | POA: Diagnosis not present

## 2017-07-21 DIAGNOSIS — H01021 Squamous blepharitis right upper eyelid: Secondary | ICD-10-CM | POA: Diagnosis not present

## 2017-07-21 DIAGNOSIS — H01025 Squamous blepharitis left lower eyelid: Secondary | ICD-10-CM | POA: Diagnosis not present

## 2017-07-21 DIAGNOSIS — Z961 Presence of intraocular lens: Secondary | ICD-10-CM | POA: Diagnosis not present

## 2017-07-21 DIAGNOSIS — H01024 Squamous blepharitis left upper eyelid: Secondary | ICD-10-CM | POA: Diagnosis not present

## 2017-07-21 DIAGNOSIS — H10413 Chronic giant papillary conjunctivitis, bilateral: Secondary | ICD-10-CM | POA: Diagnosis not present

## 2017-07-21 DIAGNOSIS — H04123 Dry eye syndrome of bilateral lacrimal glands: Secondary | ICD-10-CM | POA: Diagnosis not present

## 2017-07-21 DIAGNOSIS — H01022 Squamous blepharitis right lower eyelid: Secondary | ICD-10-CM | POA: Diagnosis not present

## 2017-07-26 DIAGNOSIS — Z96612 Presence of left artificial shoulder joint: Secondary | ICD-10-CM | POA: Diagnosis not present

## 2017-07-26 DIAGNOSIS — Z471 Aftercare following joint replacement surgery: Secondary | ICD-10-CM | POA: Diagnosis not present

## 2017-07-26 DIAGNOSIS — M5136 Other intervertebral disc degeneration, lumbar region: Secondary | ICD-10-CM | POA: Insufficient documentation

## 2017-08-01 ENCOUNTER — Telehealth (HOSPITAL_COMMUNITY): Payer: Self-pay

## 2017-08-01 NOTE — Telephone Encounter (Signed)
Pt called stating she had seen her surgeon Dr. Veverly Fells and Dr. Veverly Fells states she does not need any OT. NF 08/01/2017

## 2017-08-02 ENCOUNTER — Ambulatory Visit (HOSPITAL_COMMUNITY): Payer: PPO

## 2017-08-09 ENCOUNTER — Other Ambulatory Visit: Payer: Self-pay | Admitting: Internal Medicine

## 2017-08-10 ENCOUNTER — Ambulatory Visit: Payer: Self-pay | Admitting: *Deleted

## 2017-08-10 ENCOUNTER — Other Ambulatory Visit: Payer: Self-pay | Admitting: *Deleted

## 2017-08-10 DIAGNOSIS — Z6836 Body mass index (BMI) 36.0-36.9, adult: Secondary | ICD-10-CM | POA: Diagnosis not present

## 2017-08-10 DIAGNOSIS — E6609 Other obesity due to excess calories: Secondary | ICD-10-CM | POA: Diagnosis not present

## 2017-08-10 DIAGNOSIS — R7301 Impaired fasting glucose: Secondary | ICD-10-CM | POA: Diagnosis not present

## 2017-08-10 DIAGNOSIS — J449 Chronic obstructive pulmonary disease, unspecified: Secondary | ICD-10-CM | POA: Diagnosis not present

## 2017-08-10 DIAGNOSIS — I10 Essential (primary) hypertension: Secondary | ICD-10-CM | POA: Diagnosis not present

## 2017-08-10 DIAGNOSIS — M1990 Unspecified osteoarthritis, unspecified site: Secondary | ICD-10-CM | POA: Diagnosis not present

## 2017-08-10 DIAGNOSIS — J4520 Mild intermittent asthma, uncomplicated: Secondary | ICD-10-CM | POA: Diagnosis not present

## 2017-08-10 NOTE — Patient Outreach (Signed)
RN CM called pt about possibly changing visit date for home visit scheduled today, spoke with pt, HIPAA verified, pt reports she is in the car and on the way to see primary MD for follow up since being prescribed pain medication.  Pt states that will work fine for her due to her being at MD office today, RN to see pt week of 08/22/17 to accommodate patient's schedule due to pt has multiple upcoming appointments.   PLAN See pt for home visit within 2 weeks  Jacqlyn Larsen [facility], [place] Coordinator [phone]

## 2017-08-11 DIAGNOSIS — J4550 Severe persistent asthma, uncomplicated: Secondary | ICD-10-CM | POA: Diagnosis not present

## 2017-08-11 DIAGNOSIS — D721 Eosinophilia: Secondary | ICD-10-CM | POA: Diagnosis not present

## 2017-08-16 DIAGNOSIS — M5136 Other intervertebral disc degeneration, lumbar region: Secondary | ICD-10-CM | POA: Diagnosis not present

## 2017-08-23 ENCOUNTER — Other Ambulatory Visit: Payer: Self-pay | Admitting: *Deleted

## 2017-08-23 ENCOUNTER — Encounter: Payer: Self-pay | Admitting: *Deleted

## 2017-08-23 NOTE — Patient Outreach (Addendum)
[facility]) Care Management   08/23/2017  Glenda Terry [birth year] [id number]  ELLIANNAH Terry is an 77 y.o. female  Subjective: Routine home visit with pt, HIPAA verified, pt reports MD took her off prednisone and symbicort 07/20/17, pt states she feels better since off prednisone, pt reports she cannot remember if she took pain medication this morning so she is afraid she might take extra and is waiting to be able to take dosage q 8 hours.  Pt attending all appointments, to see Dr. Veverly Fells next Tuesday, pt denies any falls.  Objective:   Vitals:   08/23/17 1345  BP: 116/60  Pulse: 95  Resp: 18  SpO2: 97%   ROS  Physical Exam  Constitutional: She is oriented to person, place, and time. She appears well-developed and well-nourished.  HENT:  Head: Normocephalic.  Neck: Normal range of motion. Neck supple.  Cardiovascular: Normal rate and regular rhythm.  Respiratory: Effort normal and breath sounds normal.  GI: Soft. Bowel sounds are normal.  Musculoskeletal: Normal range of motion. She exhibits no edema.  Neurological: She is alert and oriented to person, place, and time.  Skin: Skin is warm and dry.  Psychiatric: She has a normal mood and affect. Her behavior is normal. Judgment and thought content normal.    Encounter Medications:   Outpatient Encounter Medications as of 08/23/2017  Medication Sig  . acetaminophen (TYLENOL 8 HOUR ARTHRITIS PAIN) 650 MG CR tablet Take 650-1,300 mg by mouth every 8 (eight) hours as needed for pain.  Marland Kitchen albuterol (PROAIR HFA) 108 (90 BASE) MCG/ACT inhaler Inhale 2 puffs into the lungs every 6 (six) hours as needed for wheezing or shortness of breath. For COPD  . ALPRAZolam (XANAX) 0.5 MG tablet Take 0.25-0.5 mg by mouth 2 (two) times daily as needed for anxiety.   . Benralizumab (FASENRA) 30 MG/ML SOSY Inject 30 mg into the skin every 8 (eight) weeks.   . budesonide-formoterol (SYMBICORT) 160-4.5 MCG/ACT inhaler Inhale 2  puffs into the lungs 2 (two) times daily.  Marland Kitchen EPINEPHrine (EPIPEN 2-PAK) 0.3 mg/0.3 mL DEVI Inject 0.3 mg into the muscle daily as needed (for anaphylatic allergic reactions.).   Marland Kitchen esomeprazole (NEXIUM) 40 MG capsule TAKE 1 CAPSULE BY MOUTH TWICE DAILY.  . ferrous sulfate 325 (65 FE) MG tablet Take 325 mg by mouth daily with breakfast.  . fexofenadine (ALLEGRA) 180 MG tablet Take 180 mg by mouth daily.  . fluticasone (FLONASE) 50 MCG/ACT nasal spray Place 1 spray into both nostrils 2 (two) times daily.  . folic acid (FOLVITE) 1 MG tablet Take 1 mg by mouth daily.  Marland Kitchen HYDROcodone-acetaminophen (NORCO) 5-325 MG tablet Take 1-2 tablets by mouth every 6 (six) hours as needed for moderate pain or severe pain. (Patient taking differently: Take 1 tablet by mouth every 8 (eight) hours as needed for moderate pain or severe pain. )  . hydrocortisone (ANUSOL-HC) 2.5 % rectal cream Place 1 application rectally 3 (three) times daily.  Marland Kitchen ipratropium-albuterol (DUONEB) 0.5-2.5 (3) MG/3ML SOLN Take 3 mLs by nebulization 2 (two) times daily.   Marland Kitchen ketotifen (ZADITOR) 0.025 % ophthalmic solution Place 1 drop into both eyes 2 (two) times daily.  . Magnesium 250 MG TABS Take 250 mg by mouth daily at 12 noon.   . montelukast (SINGULAIR) 10 MG tablet Take 10 mg by mouth daily.   Marland Kitchen olmesartan (BENICAR) 40 MG tablet Take 40 mg by mouth daily.  Vladimir Faster Glycol-Propyl Glycol (SYSTANE OP) Place 1 drop into  both eyes 2 (two) times daily.  . polyethylene glycol (MIRALAX) packet Take 17 g daily by mouth.  . potassium chloride (K-DUR) 10 MEQ tablet Take 10 mEq by mouth See admin instructions. Take 10 meq by mouth once daily for 7 days then off for 7 days, then restart  . torsemide (DEMADEX) 10 MG tablet Take 10 mg by mouth See admin instructions. Take 10 mg by mouth daily for 7 days then off for 7 days, then restart  . Turmeric 500 MG CAPS Take 500 mg by mouth daily.   . predniSONE (DELTASONE) 2.5 MG tablet Take 2.5 mg by mouth  daily with breakfast.   No facility-administered encounter medications on file as of 08/23/2017.     Functional Status:   In your present state of health, do you have any difficulty performing the following activities: 07/12/2017 06/14/2017  Hearing? N N  Comment - -  Vision? N Y  Comment - -  Difficulty concentrating or making decisions? N N  Walking or climbing stairs? N N  Dressing or bathing? Tempie Donning  Comment difficulty putting on bra and pulling up pants,  takes extra time -  Doing errands, shopping? Tempie Donning  Preparing Food and eating ? N -  Using the Toilet? N -  In the past six months, have you accidently leaked urine? Y -  Do you have problems with loss of bowel control? N -  Managing your Medications? N -  Managing your Finances? N -  Housekeeping or managing your Housekeeping? Y -  Some recent data might be hidden    Fall/Depression Screening:    Fall Risk  07/12/2017 03/31/2017 02/14/2017  Falls in the past year? Yes Yes Yes  Number falls in past yr: 2 or more 2 or more 2 or more  Injury with Fall? Yes No No  Risk Factor Category  High Fall Risk High Fall Risk High Fall Risk  Risk for fall due to : History of fall(s);Medication side effect History of fall(s);Impaired balance/gait;Medication side effect History of fall(s);Impaired balance/gait;Impaired mobility  Follow up Falls evaluation completed;Falls prevention discussed Falls evaluation completed;Falls prevention discussed Falls prevention discussed   PHQ 2/9 Scores 07/12/2017 02/14/2017 02/10/2017 01/27/2017 12/29/2016 10/03/2015 04/21/2015  PHQ - 2 Score 0 2 0 5 0 1 2  PHQ- 9 Score - 5 - 7 - - 6    Assessment:  RN CM reviewed medications with pt, changes noted in med profile, pt managing well at home,  RN CM ask pt to keep a log of when she takes pain medication so she will not forget if she took it (pt states she gets busy or the phone rings and she forgets).  RN CM discussed discharge plan with pt and will close case today,  goals met. RN CM faxed case closure letter to primary MD Dr. [name] Crane and mailed case closure letter to patient's home.  THN CM Care Plan Problem One     Most Recent Value  Care Plan Problem One  Recent left shoulder arthroplasty  Role Documenting the Problem One  Care Management Coordinator  Care Plan for Problem One  Active  THN Long Term Goal   Pt will verbalize/ demonstrate improved self care at home related to shoulder surgery within 60 days  THN Long Term Goal Start Date  06/27/17  [facility] Long Term Goal Met Date  08/23/17  Interventions for Problem One Long Term Goal  RN CM reviewed upcoming appointments, pt to see Dr. Veverly Fells next Tuesday,  reviewed medications and changes, updated med profile  [facility]N CM Short Term Goal #1   pt will verbalize improvement in pain within 30 days  THN CM Short Term Goal #1 Start Date  06/27/17  [facility] CM Short Term Goal #1 Met Date  08/23/17  Interventions for Short Term Goal #1  Pt states overall on most days pain is improved, due to damp weather pt has increase in pain intermittently,  RN CM ask pt to take pain medication as prescribed to stay ahead of the pain.  [facility]N CM Short Term Goal #2   Pt will show progression with healing related to shoulder surgery within 30 days aeb no infection, showing improvement working with OT  [facility] CM Short Term Goal #2 Start Date  06/27/17  [facility] CM Short Term Goal #2 Met Date  08/23/17  Interventions for Short Term Goal #2  Left shoulder/ armpit area incision is healed, well approximated with no signs/ symptoms infection, pt is able to lift her arm above her head.      Plan: discharge today  Jacqlyn Larsen [facility], BSN [place] Coordinator [phone]

## 2017-08-30 DIAGNOSIS — Z96612 Presence of left artificial shoulder joint: Secondary | ICD-10-CM | POA: Diagnosis not present

## 2017-08-30 DIAGNOSIS — Z471 Aftercare following joint replacement surgery: Secondary | ICD-10-CM | POA: Diagnosis not present

## 2017-10-04 DIAGNOSIS — M7022 Olecranon bursitis, left elbow: Secondary | ICD-10-CM | POA: Diagnosis not present

## 2017-10-04 DIAGNOSIS — Z96612 Presence of left artificial shoulder joint: Secondary | ICD-10-CM | POA: Insufficient documentation

## 2017-10-04 DIAGNOSIS — M19012 Primary osteoarthritis, left shoulder: Secondary | ICD-10-CM | POA: Diagnosis not present

## 2017-10-07 DIAGNOSIS — J4550 Severe persistent asthma, uncomplicated: Secondary | ICD-10-CM | POA: Diagnosis not present

## 2017-10-07 DIAGNOSIS — D721 Eosinophilia: Secondary | ICD-10-CM | POA: Diagnosis not present

## 2017-11-07 DIAGNOSIS — K219 Gastro-esophageal reflux disease without esophagitis: Secondary | ICD-10-CM | POA: Diagnosis not present

## 2017-11-07 DIAGNOSIS — R7989 Other specified abnormal findings of blood chemistry: Secondary | ICD-10-CM | POA: Diagnosis not present

## 2017-11-07 DIAGNOSIS — G8929 Other chronic pain: Secondary | ICD-10-CM | POA: Diagnosis not present

## 2017-11-07 DIAGNOSIS — R296 Repeated falls: Secondary | ICD-10-CM | POA: Diagnosis not present

## 2017-11-07 DIAGNOSIS — E249 Cushing's syndrome, unspecified: Secondary | ICD-10-CM | POA: Diagnosis not present

## 2017-11-07 DIAGNOSIS — R531 Weakness: Secondary | ICD-10-CM | POA: Diagnosis not present

## 2017-11-07 DIAGNOSIS — R51 Headache: Secondary | ICD-10-CM | POA: Diagnosis not present

## 2017-11-07 DIAGNOSIS — J45998 Other asthma: Secondary | ICD-10-CM | POA: Diagnosis not present

## 2017-11-07 DIAGNOSIS — R600 Localized edema: Secondary | ICD-10-CM | POA: Diagnosis not present

## 2017-11-07 DIAGNOSIS — N183 Chronic kidney disease, stage 3 (moderate): Secondary | ICD-10-CM | POA: Diagnosis not present

## 2017-11-07 DIAGNOSIS — R062 Wheezing: Secondary | ICD-10-CM | POA: Diagnosis not present

## 2017-11-07 DIAGNOSIS — J82 Pulmonary eosinophilia, not elsewhere classified: Secondary | ICD-10-CM | POA: Diagnosis not present

## 2017-11-09 ENCOUNTER — Other Ambulatory Visit: Payer: Self-pay | Admitting: Internal Medicine

## 2017-11-09 DIAGNOSIS — J4551 Severe persistent asthma with (acute) exacerbation: Secondary | ICD-10-CM | POA: Diagnosis not present

## 2017-11-09 DIAGNOSIS — I10 Essential (primary) hypertension: Secondary | ICD-10-CM | POA: Diagnosis not present

## 2017-11-09 DIAGNOSIS — D509 Iron deficiency anemia, unspecified: Secondary | ICD-10-CM | POA: Diagnosis not present

## 2017-11-09 DIAGNOSIS — E782 Mixed hyperlipidemia: Secondary | ICD-10-CM | POA: Diagnosis not present

## 2017-11-09 DIAGNOSIS — Z8781 Personal history of (healed) traumatic fracture: Secondary | ICD-10-CM | POA: Diagnosis not present

## 2017-11-09 DIAGNOSIS — F419 Anxiety disorder, unspecified: Secondary | ICD-10-CM | POA: Diagnosis not present

## 2017-11-09 DIAGNOSIS — N189 Chronic kidney disease, unspecified: Secondary | ICD-10-CM | POA: Diagnosis not present

## 2017-11-09 DIAGNOSIS — M5440 Lumbago with sciatica, unspecified side: Secondary | ICD-10-CM | POA: Diagnosis not present

## 2017-11-09 DIAGNOSIS — M1990 Unspecified osteoarthritis, unspecified site: Secondary | ICD-10-CM | POA: Diagnosis not present

## 2017-11-09 DIAGNOSIS — Z780 Asymptomatic menopausal state: Secondary | ICD-10-CM

## 2017-11-09 DIAGNOSIS — E1122 Type 2 diabetes mellitus with diabetic chronic kidney disease: Secondary | ICD-10-CM | POA: Diagnosis not present

## 2017-11-09 DIAGNOSIS — K228 Other specified diseases of esophagus: Secondary | ICD-10-CM | POA: Diagnosis not present

## 2017-11-09 DIAGNOSIS — J441 Chronic obstructive pulmonary disease with (acute) exacerbation: Secondary | ICD-10-CM | POA: Diagnosis not present

## 2017-11-22 DIAGNOSIS — M19012 Primary osteoarthritis, left shoulder: Secondary | ICD-10-CM | POA: Diagnosis not present

## 2017-11-22 DIAGNOSIS — M7022 Olecranon bursitis, left elbow: Secondary | ICD-10-CM | POA: Diagnosis not present

## 2017-11-28 ENCOUNTER — Ambulatory Visit: Payer: PPO | Admitting: Gastroenterology

## 2017-11-28 ENCOUNTER — Telehealth: Payer: Self-pay

## 2017-11-28 ENCOUNTER — Telehealth: Payer: Self-pay | Admitting: Gastroenterology

## 2017-11-28 ENCOUNTER — Other Ambulatory Visit: Payer: Self-pay

## 2017-11-28 ENCOUNTER — Encounter: Payer: Self-pay | Admitting: Gastroenterology

## 2017-11-28 VITALS — BP 106/67 | HR 68 | Temp 97.0°F | Ht 60.0 in | Wt 182.4 lb

## 2017-11-28 DIAGNOSIS — K5900 Constipation, unspecified: Secondary | ICD-10-CM | POA: Diagnosis not present

## 2017-11-28 DIAGNOSIS — Z86010 Personal history of colonic polyps: Secondary | ICD-10-CM

## 2017-11-28 DIAGNOSIS — D509 Iron deficiency anemia, unspecified: Secondary | ICD-10-CM

## 2017-11-28 DIAGNOSIS — Z800 Family history of malignant neoplasm of digestive organs: Secondary | ICD-10-CM | POA: Diagnosis not present

## 2017-11-28 DIAGNOSIS — Z860101 Personal history of adenomatous and serrated colon polyps: Secondary | ICD-10-CM

## 2017-11-28 MED ORDER — PEG 3350-KCL-NA BICARB-NACL 420 G PO SOLR: 4000.0000 mL | ORAL | 0 refills | Status: DC

## 2017-11-28 MED ORDER — LUBIPROSTONE 8 MCG PO CAPS: ORAL_CAPSULE | ORAL | 3 refills | Status: DC

## 2017-11-28 NOTE — Assessment & Plan Note (Signed)
Poorly managed at this time.  She takes MiraLAX and/or Colace only occasionally, states she cannot afford to take it every day.  We will check and to see the cost of Amitiza which is on her formulary.  If this is an option she will take 8 mcg 1-2 times daily with food.  Samples provided as well.  Discussed the need to better manage her constipation especially prior to colonoscopy bowel prep.  Patient voiced understanding.  She will let us know if she is having any problems.  We will plan on updating colonoscopy in the near future given her significant family history of colon cancer and multiple sisters personal history of colon adenomas.  I have discussed the risks, alternatives, benefits with regards to but not limited to the risk of reaction to medication, bleeding, infection, perforation and the patient is agreeable to proceed. Written consent to be obtained.

## 2017-11-28 NOTE — Telephone Encounter (Signed)
Called and informed pt.  

## 2017-11-28 NOTE — Assessment & Plan Note (Signed)
Stable microcytic anemia in setting of chronic renal insufficiency. She has normal iron indices and normal ferritin.  EGD is up-to-date.  Colonoscopy pending.

## 2017-11-28 NOTE — Patient Instructions (Signed)
1. Try Amitiza 8 mcg, take 1 capsule with food 1-2 times daily for constipation.  Initially you may have loose stools but this should improve the longer you are on the medication.  If needed you can hold for 1 to 2 days for loose stools.  Samples and prescription provided. 2. Colonoscopy as scheduled.  Please see separate instructions.

## 2017-11-28 NOTE — H&P (View-Only) (Signed)
Primary Care Physician: Celene Squibb, MD  Primary Gastroenterologist:  Garfield Cornea, MD   Chief Complaint  Patient presents with  . Constipation    f/u. BM's are QOD. tries not to strain    HPI: Glenda Terry is a 76 y.o. female here for follow-up of constipation.  Last seen in February.  She also has a history of colonic adenoma, family history of colon cancer 4 sisters.  She is fully recovered from her shoulder surgery.  She continues to have some back issues, takes Vicodin every morning.  Increased difficulty with constipation.  Admits that she takes MiraLAX and/or Colace only if she has not had a bowel movement in 3 to 4 days.  And will take 2 to 3 days for her to have a stool.  She states the medications are too expensive to take regularly.  No melena or rectal bleeding.  At her last office visit she was advised to drop back to Nexium once daily but she tells me she did not tolerate this due to recurrent nocturnal symptoms.  She had some weight loss during the earlier part of the year but denies any current weight loss.  Labs performed November 07, 2017 by PCP. TIBC 315, iron 97, iron saturations 31%, A1c 6.5, ferritin 123, white blood cell count 8500, hemoglobin 10.4, hematocrit 33.3, MCV 76, platelets 304,000, BUN 11, creatinine 1.56, sodium 139, potassium 4.7, total bilirubin 0.3, alkaline phosphatase 99, AST 21, ALT 15, albumin 4.1.  Patient states that she had a couple of her diuretics adjusted and plans to go back for recheck soon creatinine.  Current Outpatient Medications  Medication Sig Dispense Refill  . acetaminophen (TYLENOL 8 HOUR ARTHRITIS PAIN) 650 MG CR tablet Take 650-1,300 mg by mouth every 8 (eight) hours as needed for pain.    Marland Kitchen albuterol (PROAIR HFA) 108 (90 BASE) MCG/ACT inhaler Inhale 2 puffs into the lungs every 6 (six) hours as needed for wheezing or shortness of breath. For COPD    . ALPRAZolam (XANAX) 0.5 MG tablet Take 0.25-0.5 mg by mouth 2 (two)  times daily as needed for anxiety.     . Benralizumab (FASENRA) 30 MG/ML SOSY Inject 30 mg into the skin every 8 (eight) weeks.     . docusate sodium (COLACE) 100 MG capsule Take 100 mg by mouth. 3 times per week    . EPINEPHrine (EPIPEN 2-PAK) 0.3 mg/0.3 mL DEVI Inject 0.3 mg into the muscle daily as needed (for anaphylatic allergic reactions.).     Marland Kitchen esomeprazole (NEXIUM) 40 MG capsule TAKE 1 CAPSULE BY MOUTH TWICE DAILY. 60 capsule 2  . ferrous sulfate 325 (65 FE) MG tablet Take 325 mg by mouth daily with breakfast.    . fexofenadine (ALLEGRA) 180 MG tablet Take 180 mg by mouth daily.    . fluticasone (FLONASE) 50 MCG/ACT nasal spray Place 1 spray into both nostrils 2 (two) times daily.    . folic acid (FOLVITE) 1 MG tablet Take 1 mg by mouth daily.    Marland Kitchen HYDROcodone-acetaminophen (NORCO) 5-325 MG tablet Take 1-2 tablets by mouth every 6 (six) hours as needed for moderate pain or severe pain. (Patient taking differently: Take 1 tablet by mouth every 8 (eight) hours as needed for moderate pain or severe pain. ) 40 tablet 0  . hydrocortisone (ANUSOL-HC) 2.5 % rectal cream Place 1 application rectally 3 (three) times daily. (Patient taking differently: Place 1 application rectally as needed. ) 30 g 3  .  ipratropium-albuterol (DUONEB) 0.5-2.5 (3) MG/3ML SOLN Take 3 mLs by nebulization 2 (two) times daily.     Marland Kitchen ketotifen (ZADITOR) 0.025 % ophthalmic solution Place 1 drop into both eyes 2 (two) times daily.    . Magnesium 250 MG TABS Take 250 mg by mouth daily at 12 noon.     . mometasone-formoterol (DULERA) 100-5 MCG/ACT AERO Inhale 2 puffs into the lungs 2 (two) times daily.    . montelukast (SINGULAIR) 10 MG tablet Take 10 mg by mouth daily.     Marland Kitchen olmesartan (BENICAR) 40 MG tablet Take 40 mg by mouth daily.    Vladimir Faster Glycol-Propyl Glycol (SYSTANE OP) Place 1 drop into both eyes 2 (two) times daily.    . polyethylene glycol (MIRALAX) packet Take 17 g daily by mouth. (Patient taking differently:  Take 17 g by mouth daily as needed. ) 14 each 2  . potassium chloride (K-DUR) 10 MEQ tablet Take 10 mEq by mouth every other day. Take 10 meq by mouth once daily for 7 days then off for 7 days, then restart    . torsemide (DEMADEX) 10 MG tablet Take 10 mg by mouth every other day.     . Turmeric 500 MG CAPS Take 500 mg by mouth daily.      No current facility-administered medications for this visit.     Allergies as of 11/28/2017 - Review Complete 11/28/2017  Allergen Reaction Noted  . Flexeril [cyclobenzaprine] Anaphylaxis 01/12/2012  . Other Anaphylaxis and Other (See Comments) 02/08/2011  . Keflex [cephalexin] Itching 05/26/2012  . Aspirin Other (See Comments) 05/05/2015  . Statins Other (See Comments) 05/17/2011  . Adhesive [tape] Rash 11/01/2014  . Chlorhexidine Rash 11/19/2014   Past Medical History:  Diagnosis Date  . Anxiety   . Back pain, chronic   . CKD (chronic kidney disease) stage 3, GFR 30-59 ml/min (HCC)   . Compression fx, thoracic spine (HCC)    T - 11  . COPD (chronic obstructive pulmonary disease) ([place]) 12/16/2015  . 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 Essential hypertension   . GERD (gastroesophageal reflux disease)   . HOH (hard of hearing)   . Inflammatory arthritis   . Iron deficiency anemia 01/2012  . Pulmonary eosinophilia ([place])   . Spinal stenosis   . Tubular adenoma 11/2012   Past Surgical History:  Procedure Laterality Date  . ABDOMINAL HYSTERECTOMY    . BACK SURGERY    . CARPAL TUNNEL RELEASE Right 4/14  . CATARACT EXTRACTION W/PHACO Left 11/19/2014   Procedure: CATARACT EXTRACTION PHACO AND INTRAOCULAR LENS PLACEMENT (IOC);  Surgeon: Williams Che, MD;  Location: [facility];  Service: Ophthalmology;  Laterality: Left;  CDE: 4.77  . CATARACT EXTRACTION W/PHACO Right 02/03/2015   Procedure: CATARACT EXTRACTION PHACO AND INTRAOCULAR LENS PLACEMENT ([place]);  Surgeon: Williams Che, MD;  Location: [facility];  Service: Ophthalmology;  Laterality: Right;  CDE:  4.54  . COLONOSCOPY  06/19/2008   RMR: tortuous and elongated colon with scattered left-sided diverticula/colonic mucosa appeared entirely normal. Prior colonic ulcers had resolved.  . COLONOSCOPY  12/2007   Dr. Gala Romney: Scattered diffuse sigmoid diverticula, 2 areas of ulceration at the hepatic flexure. Biopsies unremarkable.  . COLONOSCOPY N/A 11/20/2012   next TCS 11/2017  . DECOMPRESSIVE LUMBAR LAMINECTOMY LEVEL 2  04/20/2012   Procedure: DECOMPRESSIVE LUMBAR LAMINECTOMY LEVEL 2;  Surgeon: Tobi Bastos, MD;  [facility];  Service: Orthopedics;  Laterality: Right;  Decompressive Lumbar Laminectomy of the L4 - L5  and L5 - S1 Complete/Laminectomy L5 on the Right (X-Ray)  . ESOPHAGOGASTRODUODENOSCOPY  12/2007   Dr. 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 Small hiatal hernia. Slightly pale duodenal mucosa (biopsy unremarkable)  . ESOPHAGOGASTRODUODENOSCOPY (EGD) WITH PROPOFOL N/A 01/20/2017   Procedure: ESOPHAGOGASTRODUODENOSCOPY (EGD) WITH PROPOFOL;  Surgeon: Daneil Dolin, MD;  [facility];  Service: Endoscopy;  Laterality: N/A;  12:30pm  . EYE SURGERY     BIL CATARACTS  . IR KYPHO THORACIC WITH BONE BIOPSY  02/08/2017  . IR RADIOLOGIST EVAL & MGMT  02/02/2017  . JOINT REPLACEMENT    . KNEE ARTHROSCOPY Right   . MALONEY DILATION N/A 01/20/2017   Procedure: Venia Minks DILATION;  Surgeon: Daneil Dolin, MD;  [facility];  Service: Endoscopy;  Laterality: N/A;  . REVERSE SHOULDER ARTHROPLASTY Right 05/24/2014   Procedure: RIGHT SHOULDER REVERSE ARTHROPLASTY;  Surgeon: Netta Cedars, MD;  Location: [facility];  Service: Orthopedics;  Laterality: Right;  . REVERSE SHOULDER ARTHROPLASTY Left 06/10/2017   Procedure: LEFT REVERSE SHOULDER ARTHROPLASTY;  Surgeon: Netta Cedars, MD;  Location: [facility];  Service: Orthopedics;  Laterality: Left;  . TOTAL KNEE ARTHROPLASTY  01/24/2012   Procedure: TOTAL KNEE ARTHROPLASTY;  Surgeon: Gearlean Alf, MD;  [facility];  Service: Orthopedics;  Laterality: Right;  . TUBAL LIGATION     Family History  Problem Relation Age of Onset  . Breast cancer Sister   . Colon cancer Sister        two deceased, one living and terminal, one current undergoing treatment, ages 43, 67, 15, 18  . Hypertension Mother   . Stroke Mother   . Heart attack Mother   . Hypertension Father   . Prostate cancer Father    Social History   Tobacco Use  . Smoking status: Former Smoker    Packs/day: 2.00    Years: 40.00    Pack years: 80.00    Types: Cigarettes    Last attempt to quit: 01/19/1992    Years since quitting: 25.8  . Smokeless tobacco: Never Used  Substance Use Topics  . Alcohol use: No  . Drug use: No    ROS:  General: Negative for anorexia, weight loss, fever, chills, fatigue, weakness. ENT: Negative for hoarseness, difficulty swallowing , nasal congestion. CV: Negative for chest pain, angina, palpitations, dyspnea on exertion, peripheral edema.  Respiratory: Negative for dyspnea at rest, dyspnea on exertion, cough, sputum, wheezing.  GI: See history of present illness. GU:  Negative for dysuria, hematuria, urinary incontinence, urinary frequency, nocturnal urination.  Endo: Negative for unusual weight change.    Physical Examination:   BP 106/67   Pulse 68   Temp (!) 97 F (36.1 C) (Oral)   Ht 5' (1.524 m)   Wt 182 lb 6.4 oz (82.7 kg)   BMI 35.62 kg/m   General: Well-nourished, well-developed in no acute distress.  Eyes: No icterus. Mouth: Oropharyngeal mucosa moist and pink , no lesions erythema or exudate. Lungs: Clear to auscultation bilaterally.  Heart: Regular rate and rhythm, no murmurs rubs or gallops.  Abdomen: Bowel sounds are normal, nontender, nondistended, no hepatosplenomegaly or masses, no abdominal bruits or hernia , no rebound or guarding.   Extremities: No lower extremity edema. No clubbing or deformities. Neuro: Alert and oriented x 4   Skin:  Warm and dry, no jaundice.   Psych: Alert and cooperative, normal mood and affect.  Labs:  Lab Results  Component Value Date  CREATININE 1.19 (H) 06/20/2017   BUN 20 06/20/2017   NA 137 06/20/2017   K 4.6 06/20/2017   CL 100 (L) 06/20/2017   CO2 26 06/20/2017   Lab Results  Component Value Date   ALT 36 01/21/2017   AST 29 01/21/2017   ALKPHOS 64 01/21/2017   BILITOT 0.5 01/21/2017   Lab Results  Component Value Date   WBC 10.0 06/20/2017   HGB 10.1 (L) 06/20/2017   HCT 33.9 (L) 06/20/2017   MCV 79.6 06/20/2017   PLT 370 06/20/2017    Imaging Studies: No results found.

## 2017-11-28 NOTE — Telephone Encounter (Signed)
Patient needs to hold iron 7 days prior to upcoming colonoscopy. Thanks!

## 2017-11-28 NOTE — Telephone Encounter (Signed)
Tried to call pt to inform of pre-op appt 12/16/17 at 2:45pm, no answer, LMOAM. Letter mailed.

## 2017-11-28 NOTE — Progress Notes (Signed)
Primary Care Physician: Celene Squibb, MD  Primary Gastroenterologist:  Garfield Cornea, MD   Chief Complaint  Patient presents with  . Constipation    f/u. BM's are QOD. tries not to strain    HPI: Glenda Terry is a 77 y.o. female here for follow-up of constipation.  Last seen in February.  She also has a history of colonic adenoma, family history of colon cancer 4 sisters.  She is fully recovered from her shoulder surgery.  She continues to have some back issues, takes Vicodin every morning.  Increased difficulty with constipation.  Admits that she takes MiraLAX and/or Colace only if she has not had a bowel movement in 3 to 4 days.  And will take 2 to 3 days for her to have a stool.  She states the medications are too expensive to take regularly.  No melena or rectal bleeding.  At her last office visit she was advised to drop back to Nexium once daily but she tells me she did not tolerate this due to recurrent nocturnal symptoms.  She had some weight loss during the earlier part of the year but denies any current weight loss.  Labs performed November 07, 2017 by PCP. TIBC 315, iron 97, iron saturations 31%, A1c 6.5, ferritin 123, white blood cell count 8500, hemoglobin 10.4, hematocrit 33.3, MCV 76, platelets 304,000, BUN 11, creatinine 1.56, sodium 139, potassium 4.7, total bilirubin 0.3, alkaline phosphatase 99, AST 21, ALT 15, albumin 4.1.  Patient states that she had a couple of her diuretics adjusted and plans to go back for recheck soon creatinine.  Current Outpatient Medications  Medication Sig Dispense Refill  . acetaminophen (TYLENOL 8 HOUR ARTHRITIS PAIN) 650 MG CR tablet Take 650-1,300 mg by mouth every 8 (eight) hours as needed for pain.    Marland Kitchen albuterol (PROAIR HFA) 108 (90 BASE) MCG/ACT inhaler Inhale 2 puffs into the lungs every 6 (six) hours as needed for wheezing or shortness of breath. For COPD    . ALPRAZolam (XANAX) 0.5 MG tablet Take 0.25-0.5 mg by mouth 2 (two)  times daily as needed for anxiety.     . Benralizumab (FASENRA) 30 MG/ML SOSY Inject 30 mg into the skin every 8 (eight) weeks.     . docusate sodium (COLACE) 100 MG capsule Take 100 mg by mouth. 3 times per week    . EPINEPHrine (EPIPEN 2-PAK) 0.3 mg/0.3 mL DEVI Inject 0.3 mg into the muscle daily as needed (for anaphylatic allergic reactions.).     Marland Kitchen esomeprazole (NEXIUM) 40 MG capsule TAKE 1 CAPSULE BY MOUTH TWICE DAILY. 60 capsule 2  . ferrous sulfate 325 (65 FE) MG tablet Take 325 mg by mouth daily with breakfast.    . fexofenadine (ALLEGRA) 180 MG tablet Take 180 mg by mouth daily.    . fluticasone (FLONASE) 50 MCG/ACT nasal spray Place 1 spray into both nostrils 2 (two) times daily.    . folic acid (FOLVITE) 1 MG tablet Take 1 mg by mouth daily.    Marland Kitchen HYDROcodone-acetaminophen (NORCO) 5-325 MG tablet Take 1-2 tablets by mouth every 6 (six) hours as needed for moderate pain or severe pain. (Patient taking differently: Take 1 tablet by mouth every 8 (eight) hours as needed for moderate pain or severe pain. ) 40 tablet 0  . hydrocortisone (ANUSOL-HC) 2.5 % rectal cream Place 1 application rectally 3 (three) times daily. (Patient taking differently: Place 1 application rectally as needed. ) 30 g 3  .  ipratropium-albuterol (DUONEB) 0.5-2.5 (3) MG/3ML SOLN Take 3 mLs by nebulization 2 (two) times daily.     Marland Kitchen ketotifen (ZADITOR) 0.025 % ophthalmic solution Place 1 drop into both eyes 2 (two) times daily.    . Magnesium 250 MG TABS Take 250 mg by mouth daily at 12 noon.     . mometasone-formoterol (DULERA) 100-5 MCG/ACT AERO Inhale 2 puffs into the lungs 2 (two) times daily.    . montelukast (SINGULAIR) 10 MG tablet Take 10 mg by mouth daily.     Marland Kitchen olmesartan (BENICAR) 40 MG tablet Take 40 mg by mouth daily.    Vladimir Faster Glycol-Propyl Glycol (SYSTANE OP) Place 1 drop into both eyes 2 (two) times daily.    . polyethylene glycol (MIRALAX) packet Take 17 g daily by mouth. (Patient taking differently:  Take 17 g by mouth daily as needed. ) 14 each 2  . potassium chloride (K-DUR) 10 MEQ tablet Take 10 mEq by mouth every other day. Take 10 meq by mouth once daily for 7 days then off for 7 days, then restart    . torsemide (DEMADEX) 10 MG tablet Take 10 mg by mouth every other day.     . Turmeric 500 MG CAPS Take 500 mg by mouth daily.      No current facility-administered medications for this visit.     Allergies as of 11/28/2017 - Review Complete 11/28/2017  Allergen Reaction Noted  . Flexeril [cyclobenzaprine] Anaphylaxis 01/12/2012  . Other Anaphylaxis and Other (See Comments) 02/08/2011  . Keflex [cephalexin] Itching 05/26/2012  . Aspirin Other (See Comments) 05/05/2015  . Statins Other (See Comments) 05/17/2011  . Adhesive [tape] Rash 11/01/2014  . Chlorhexidine Rash 11/19/2014   Past Medical History:  Diagnosis Date  . Anxiety   . Back pain, chronic   . CKD (chronic kidney disease) stage 3, GFR 30-59 ml/min (HCC)   . Compression fx, thoracic spine (HCC)    T - 11  . COPD (chronic obstructive pulmonary disease) ([place]) 12/16/2015  . 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 Essential hypertension   . GERD (gastroesophageal reflux disease)   . HOH (hard of hearing)   . Inflammatory arthritis   . Iron deficiency anemia 01/2012  . Pulmonary eosinophilia ([place])   . Spinal stenosis   . Tubular adenoma 11/2012   Past Surgical History:  Procedure Laterality Date  . ABDOMINAL HYSTERECTOMY    . BACK SURGERY    . CARPAL TUNNEL RELEASE Right 4/14  . CATARACT EXTRACTION W/PHACO Left 11/19/2014   Procedure: CATARACT EXTRACTION PHACO AND INTRAOCULAR LENS PLACEMENT (IOC);  Surgeon: Williams Che, MD;  Location[facility];  Service: Ophthalmology;  Laterality: Left;  CDE: 4.77  . CATARACT EXTRACTION W/PHACO Right 02/03/2015   Procedure: CATARACT EXTRACTION PHACO AND INTRAOCULAR LENS PLACEMENT ([place]);  Surgeon: Williams Che, MD;  Location: [facility];  Service: Ophthalmology;  Laterality: Right;  CDE:  4.54  . COLONOSCOPY  06/19/2008   RMR: tortuous and elongated colon with scattered left-sided diverticula/colonic mucosa appeared entirely normal. Prior colonic ulcers had resolved.  . COLONOSCOPY  12/2007   Dr. Gala Romney: Scattered diffuse sigmoid diverticula, 2 areas of ulceration at the hepatic flexure. Biopsies unremarkable.  . COLONOSCOPY N/A 11/20/2012   next TCS 11/2017  . DECOMPRESSIVE LUMBAR LAMINECTOMY LEVEL 2  04/20/2012   Procedure: DECOMPRESSIVE LUMBAR LAMINECTOMY LEVEL 2;  Surgeon: Tobi Bastos, MD;  [facility];  Service: Orthopedics;  Laterality: Right;  Decompressive Lumbar Laminectomy of the L4 - L5  and L5 - S1 Complete/Laminectomy L5 on the Right (X-Ray)  . ESOPHAGOGASTRODUODENOSCOPY  12/2007   Dr. 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 Small hiatal hernia. Slightly pale duodenal mucosa (biopsy unremarkable)  . ESOPHAGOGASTRODUODENOSCOPY (EGD) WITH PROPOFOL N/A 01/20/2017   Procedure: ESOPHAGOGASTRODUODENOSCOPY (EGD) WITH PROPOFOL;  Surgeon: Daneil Dolin, MD;  [facility];  Service: Endoscopy;  Laterality: N/A;  12:30pm  . EYE SURGERY     BIL CATARACTS  . IR KYPHO THORACIC WITH BONE BIOPSY  02/08/2017  . IR RADIOLOGIST EVAL & MGMT  02/02/2017  . JOINT REPLACEMENT    . KNEE ARTHROSCOPY Right   . MALONEY DILATION N/A 01/20/2017   Procedure: Venia Minks DILATION;  Surgeon: Daneil Dolin, MD;  [facility];  Service: Endoscopy;  Laterality: N/A;  . REVERSE SHOULDER ARTHROPLASTY Right 05/24/2014   Procedure: RIGHT SHOULDER REVERSE ARTHROPLASTY;  Surgeon: Netta Cedars, MD;  Location: [facility];  Service: Orthopedics;  Laterality: Right;  . REVERSE SHOULDER ARTHROPLASTY Left 06/10/2017   Procedure: LEFT REVERSE SHOULDER ARTHROPLASTY;  Surgeon: Netta Cedars, MD;  Location: [facility];  Service: Orthopedics;  Laterality: Left;  . TOTAL KNEE ARTHROPLASTY  01/24/2012   Procedure: TOTAL KNEE ARTHROPLASTY;  Surgeon: Gearlean Alf, MD;  [facility];  Service: Orthopedics;  Laterality: Right;  . TUBAL LIGATION     Family History  Problem Relation Age of Onset  . Breast cancer Sister   . Colon cancer Sister        two deceased, one living and terminal, one current undergoing treatment, ages 38, 12, 37, 97  . Hypertension Mother   . Stroke Mother   . Heart attack Mother   . Hypertension Father   . Prostate cancer Father    Social History   Tobacco Use  . Smoking status: Former Smoker    Packs/day: 2.00    Years: 40.00    Pack years: 80.00    Types: Cigarettes    Last attempt to quit: 01/19/1992    Years since quitting: 25.8  . Smokeless tobacco: Never Used  Substance Use Topics  . Alcohol use: No  . Drug use: No    ROS:  General: Negative for anorexia, weight loss, fever, chills, fatigue, weakness. ENT: Negative for hoarseness, difficulty swallowing , nasal congestion. CV: Negative for chest pain, angina, palpitations, dyspnea on exertion, peripheral edema.  Respiratory: Negative for dyspnea at rest, dyspnea on exertion, cough, sputum, wheezing.  GI: See history of present illness. GU:  Negative for dysuria, hematuria, urinary incontinence, urinary frequency, nocturnal urination.  Endo: Negative for unusual weight change.    Physical Examination:   BP 106/67   Pulse 68   Temp (!) 97 F (36.1 C) (Oral)   Ht 5' (1.524 m)   Wt 182 lb 6.4 oz (82.7 kg)   BMI 35.62 kg/m   General: Well-nourished, well-developed in no acute distress.  Eyes: No icterus. Mouth: Oropharyngeal mucosa moist and pink , no lesions erythema or exudate. Lungs: Clear to auscultation bilaterally.  Heart: Regular rate and rhythm, no murmurs rubs or gallops.  Abdomen: Bowel sounds are normal, nontender, nondistended, no hepatosplenomegaly or masses, no abdominal bruits or hernia , no rebound or guarding.   Extremities: No lower extremity edema. No clubbing or deformities. Neuro: Alert and oriented x 4   Skin:  Warm and dry, no jaundice.   Psych: Alert and cooperative, normal mood and affect.  Labs:  Lab Results  Component Value Date  CREATININE 1.19 (H) 06/20/2017   BUN 20 06/20/2017   NA 137 06/20/2017   K 4.6 06/20/2017   CL 100 (L) 06/20/2017   CO2 26 06/20/2017   Lab Results  Component Value Date   ALT 36 01/21/2017   AST 29 01/21/2017   ALKPHOS 64 01/21/2017   BILITOT 0.5 01/21/2017   Lab Results  Component Value Date   WBC 10.0 06/20/2017   HGB 10.1 (L) 06/20/2017   HCT 33.9 (L) 06/20/2017   MCV 79.6 06/20/2017   PLT 370 06/20/2017    Imaging Studies: No results found.

## 2017-11-29 NOTE — Progress Notes (Signed)
CC'D TO PCP °

## 2017-12-01 DIAGNOSIS — M5126 Other intervertebral disc displacement, lumbar region: Secondary | ICD-10-CM | POA: Diagnosis not present

## 2017-12-02 DIAGNOSIS — D721 Eosinophilia: Secondary | ICD-10-CM | POA: Diagnosis not present

## 2017-12-02 DIAGNOSIS — J4550 Severe persistent asthma, uncomplicated: Secondary | ICD-10-CM | POA: Diagnosis not present

## 2017-12-06 ENCOUNTER — Ambulatory Visit: Payer: PPO | Admitting: Internal Medicine

## 2017-12-09 ENCOUNTER — Ambulatory Visit (HOSPITAL_COMMUNITY)
Admission: RE | Admit: 2017-12-09 | Discharge: 2017-12-09 | Disposition: A | Discharge status: Home / Self Care | Payer: PPO | Source: Ambulatory Visit | Attending: Internal Medicine | Admitting: Internal Medicine

## 2017-12-09 DIAGNOSIS — M1990 Unspecified osteoarthritis, unspecified site: Secondary | ICD-10-CM | POA: Diagnosis not present

## 2017-12-09 DIAGNOSIS — Z780 Asymptomatic menopausal state: Secondary | ICD-10-CM | POA: Insufficient documentation

## 2017-12-09 DIAGNOSIS — N183 Chronic kidney disease, stage 3 (moderate): Secondary | ICD-10-CM | POA: Diagnosis not present

## 2017-12-09 DIAGNOSIS — M8589 Other specified disorders of bone density and structure, multiple sites: Secondary | ICD-10-CM | POA: Diagnosis not present

## 2017-12-09 DIAGNOSIS — R7301 Impaired fasting glucose: Secondary | ICD-10-CM | POA: Diagnosis not present

## 2017-12-09 DIAGNOSIS — K219 Gastro-esophageal reflux disease without esophagitis: Secondary | ICD-10-CM | POA: Diagnosis not present

## 2017-12-09 DIAGNOSIS — J441 Chronic obstructive pulmonary disease with (acute) exacerbation: Secondary | ICD-10-CM | POA: Diagnosis not present

## 2017-12-09 DIAGNOSIS — F411 Generalized anxiety disorder: Secondary | ICD-10-CM | POA: Diagnosis not present

## 2017-12-09 DIAGNOSIS — I10 Essential (primary) hypertension: Secondary | ICD-10-CM | POA: Diagnosis not present

## 2017-12-09 DIAGNOSIS — Z0000 Encounter for general adult medical examination without abnormal findings: Secondary | ICD-10-CM | POA: Diagnosis not present

## 2017-12-09 DIAGNOSIS — Z6836 Body mass index (BMI) 36.0-36.9, adult: Secondary | ICD-10-CM | POA: Diagnosis not present

## 2017-12-13 ENCOUNTER — Telehealth: Payer: Self-pay | Admitting: Internal Medicine

## 2017-12-13 MED ORDER — LINACLOTIDE 290 MCG PO CAPS: 290.0000 ug | ORAL_CAPSULE | Freq: Every day | ORAL | 5 refills | Status: DC

## 2017-12-13 NOTE — Telephone Encounter (Signed)
Lmom, waiting on a return call.  

## 2017-12-13 NOTE — Telephone Encounter (Signed)
[phone]  PLEASE CALL PATIENT, THE PILL Glenda Terry GAVE HER IS MAKING HER NAUSEAS, WANTS TO KNOW WHAT SHE CAN DO TO STOP THAT

## 2017-12-13 NOTE — Telephone Encounter (Signed)
Pt is currently taking Amitiza for constipation. C/o severe nausea with Amitiza. Pt is taking medication with a full stomach and continues to have nausea. She has had some bowel movements but feels the [place] worked better. Please advise.

## 2017-12-13 NOTE — Telephone Encounter (Signed)
rx for linzess sent in. Stop amitiza.

## 2017-12-13 NOTE — Patient Instructions (Signed)
Glenda Terry  12/13/2017     @PREFPERIOPPHARMACY @   Your procedure is scheduled on  12/22/2017   Report to [facility] at  615  A.M.  Call this number if you have problems the morning of surgery:  [phone]   Remember:  Do not eat or drink after midnight.  You may drink clear liquids until (follow the instructions given to you) .  Clear liquids allowed are:                    Water, Juice (non-citric and without pulp), Carbonated beverages, Clear Tea, Black Coffee only, Plain Jell-O only, Gatorade and Plain Popsicles only    Take these medicines the morning of surgery with A SIP OF WATER  Xanax ( if needed), nexium, allegra, hydrocodone ( if needed), singulair, olmesartan. Use your nebulizer and your inhalers before you come.    Do not wear jewelry, make-up or nail polish.  Do not wear lotions, powders, or perfumes, or deodorant.  Do not shave 48 hours prior to surgery.  Men may shave face and neck.  Do not bring valuables to the hospital.  [facility] is not responsible for any belongings or valuables.  Contacts, dentures or bridgework may not be worn into surgery.  Leave your suitcase in the car.  After surgery it may be brought to your room.  For patients admitted to the hospital, discharge time will be determined by your treatment team.  Patients discharged the day of surgery will not be allowed to drive home.   Name and phone number of your driver:   family Special instructions:  Follow the diet and prep instructions given to you by Dr Roseanne Kaufman office.  Please read over the following fact sheets that you were given. Anesthesia Post-op Instructions and Care and Recovery After Surgery       Colonoscopy, Adult A colonoscopy is an exam to look at the large intestine. It is done to check for problems, such as:  Lumps (tumors).  Growths (polyps).  Swelling (inflammation).  Bleeding.  What happens before the procedure? Eating and  drinking Follow instructions from your doctor about eating and drinking. These instructions may include:  A few days before the procedure - follow a low-fiber diet. ? Avoid nuts. ? Avoid seeds. ? Avoid dried fruit. ? Avoid raw fruits. ? Avoid vegetables.  1-3 days before the procedure - follow a clear liquid diet. Avoid liquids that have red or purple dye. Drink only clear liquids, such as: ? Clear broth or bouillon. ? Black coffee or tea. ? Clear juice. ? Clear soft drinks or sports drinks. ? Gelatin dessert. ? Popsicles.  On the day of the procedure - do not eat or drink anything during the 2 hours before the procedure.  Bowel prep If you were prescribed an oral bowel prep:  Take it as told by your doctor. Starting the day before your procedure, you will need to drink a lot of liquid. The liquid will cause you to poop (have bowel movements) until your poop is almost clear or light green.  If your skin or butt gets irritated from diarrhea, you may: ? Wipe the area with wipes that have medicine in them, such as adult wet wipes with aloe and vitamin E. ? Put something on your skin that soothes the area, such as petroleum jelly.  If you throw up (vomit) while drinking the bowel prep, take a  break for up to 60 minutes. Then begin the bowel prep again. If you keep throwing up and you cannot take the bowel prep without throwing up, call your doctor.  General instructions  Ask your doctor about changing or stopping your normal medicines. This is important if you take diabetes medicines or blood thinners.  Plan to have someone take you home from the hospital or clinic. What happens during the procedure?  An IV tube may be put into one of your veins.  You will be given medicine to help you relax (sedative).  To reduce your risk of infection: ? Your doctors will wash their hands. ? Your anal area will be washed with soap.  You will be asked to lie on your side with your knees  bent.  Your doctor will get a long, thin, flexible tube ready. The tube will have a camera and a light on the end.  The tube will be put into your anus.  The tube will be gently put into your large intestine.  Air will be delivered into your large intestine to keep it open. You may feel some pressure or cramping.  The camera will be used to take photos.  A small tissue sample may be removed from your body to be looked at under a microscope (biopsy). If any possible problems are found, the tissue will be sent to a lab for testing.  If small growths are found, your doctor may remove them and have them checked for cancer.  The tube that was put into your anus will be slowly removed. The procedure may vary among doctors and hospitals. What happens after the procedure?  Your doctor will check on you often until the medicines you were given have worn off.  Do not drive for 24 hours after the procedure.  You may have a small amount of blood in your poop.  You may pass gas.  You may have mild cramps or bloating in your belly (abdomen).  It is up to you to get the results of your procedure. Ask your doctor, or the department performing the procedure, when your results will be ready. This information is not intended to replace advice given to you by your health care provider. Make sure you discuss any questions you have with your health care provider. Document Released: 04/03/2010 Document Revised: 12/31/2015 Document Reviewed: 05/13/2015 Elsevier Interactive Patient Education  2017 [employer]  Colonoscopy, Adult, Care After This sheet gives you information about how to care for yourself after your procedure. Your health care provider may also give you more specific instructions. If you have problems or questions, contact your health care provider. What can I expect after the procedure? After the procedure, it is common to have:  A small amount of blood in your stool for 24 hours  after the procedure.  Some gas.  Mild abdominal cramping or bloating.  Follow these instructions at home: General instructions   For the first 24 hours after the procedure: ? Do not drive or use machinery. ? Do not sign important documents. ? Do not drink alcohol. ? Do your regular daily activities at a slower pace than normal. ? Eat soft, easy-to-digest foods. ? Rest often.  Take over-the-counter or prescription medicines only as told by your health care provider.  It is up to you to get the results of your procedure. Ask your health care provider, or the department performing the procedure, when your results will be ready. Relieving cramping and bloating  Try walking around when you have cramps or feel bloated.  Apply heat to your abdomen as told by your health care provider. Use a heat source that your health care provider recommends, such as a moist heat pack or a heating pad. ? Place a towel between your skin and the heat source. ? Leave the heat on for 20-30 minutes. ? Remove the heat if your skin turns bright red. This is especially important if you are unable to feel pain, heat, or cold. You may have a greater risk of getting burned. Eating and drinking  Drink enough fluid to keep your urine clear or pale yellow.  Resume your normal diet as instructed by your health care provider. Avoid heavy or fried foods that are hard to digest.  Avoid drinking alcohol for as long as instructed by your health care provider. Contact a health care provider if:  You have blood in your stool 2-3 days after the procedure. Get help right away if:  You have more than a small spotting of blood in your stool.  You pass large blood clots in your stool.  Your abdomen is swollen.  You have nausea or vomiting.  You have a fever.  You have increasing abdominal pain that is not relieved with medicine. This information is not intended to replace advice given to you by your health care  provider. Make sure you discuss any questions you have with your health care provider. Document Released: 10/14/2003 Document Revised: 11/24/2015 Document Reviewed: 05/13/2015 Elsevier Interactive Patient Education  2018 [place] Anesthesia is a term that refers to techniques, procedures, and medicines that help a person stay safe and comfortable during a medical procedure. Monitored anesthesia care, or sedation, is one type of anesthesia. Your anesthesia specialist may recommend sedation if you will be having a procedure that does not require you to be unconscious, such as:  Cataract surgery.  A dental procedure.  A biopsy.  A colonoscopy.  During the procedure, you may receive a medicine to help you relax (sedative). There are three levels of sedation:  Mild sedation. At this level, you may feel awake and relaxed. You will be able to follow directions.  Moderate sedation. At this level, you will be sleepy. You may not remember the procedure.  Deep sedation. At this level, you will be asleep. You will not remember the procedure.  The more medicine you are given, the deeper your level of sedation will be. Depending on how you respond to the procedure, the anesthesia specialist may change your level of sedation or the type of anesthesia to fit your needs. An anesthesia specialist will monitor you closely during the procedure. Let your health care provider know about:  Any allergies you have.  All medicines you are taking, including vitamins, herbs, eye drops, creams, and over-the-counter medicines.  Any use of steroids (by mouth or as a cream).  Any problems you or family members have had with sedatives and anesthetic medicines.  Any blood disorders you have.  Any surgeries you have had.  Any medical conditions you have, such as sleep apnea.  Whether you are pregnant or may be pregnant.  Any use of cigarettes, alcohol, or street drugs. What  are the risks? Generally, this is a safe procedure. However, problems may occur, including:  Getting too much medicine (oversedation).  Nausea.  Allergic reaction to medicines.  Trouble breathing. If this happens, a breathing tube may be used to help with breathing. It will  be removed when you are awake and breathing on your own.  Heart trouble.  Lung trouble.  Before the procedure Staying hydrated Follow instructions from your health care provider about hydration, which may include:  Up to 2 hours before the procedure - you may continue to drink clear liquids, such as water, clear fruit juice, black coffee, and plain tea.  Eating and drinking restrictions Follow instructions from your health care provider about eating and drinking, which may include:  8 hours before the procedure - stop eating heavy meals or foods such as meat, fried foods, or fatty foods.  6 hours before the procedure - stop eating light meals or foods, such as toast or cereal.  6 hours before the procedure - stop drinking milk or drinks that contain milk.  2 hours before the procedure - stop drinking clear liquids.  Medicines Ask your health care provider about:  Changing or stopping your regular medicines. This is especially important if you are taking diabetes medicines or blood thinners.  Taking medicines such as aspirin and ibuprofen. These medicines can thin your blood. Do not take these medicines before your procedure if your health care provider instructs you not to.  Tests and exams  You will have a physical exam.  You may have blood tests done to show: ? How well your kidneys and liver are working. ? How well your blood can clot.  General instructions  Plan to have someone take you home from the hospital or clinic.  If you will be going home right after the procedure, plan to have someone with you for 24 hours.  What happens during the procedure?  Your blood pressure, heart rate,  breathing, level of pain and overall condition will be monitored.  An IV tube will be inserted into one of your veins.  Your anesthesia specialist will give you medicines as needed to keep you comfortable during the procedure. This may mean changing the level of sedation.  The procedure will be performed. After the procedure  Your blood pressure, heart rate, breathing rate, and blood oxygen level will be monitored until the medicines you were given have worn off.  Do not drive for 24 hours if you received a sedative.  You may: ? Feel sleepy, clumsy, or nauseous. ? Feel forgetful about what happened after the procedure. ? Have a sore throat if you had a breathing tube during the procedure. ? Vomit. This information is not intended to replace advice given to you by your health care provider. Make sure you discuss any questions you have with your health care provider. Document Released: 11/25/2004 Document Revised: 08/08/2015 Document Reviewed: 06/22/2015 Elsevier Interactive Patient Education  2018 [place], Care After These instructions provide you with information about caring for yourself after your procedure. Your health care provider may also give you more specific instructions. Your treatment has been planned according to current medical practices, but problems sometimes occur. Call your health care provider if you have any problems or questions after your procedure. What can I expect after the procedure? After your procedure, it is common to:  Feel sleepy for several hours.  Feel clumsy and have poor balance for several hours.  Feel forgetful about what happened after the procedure.  Have poor judgment for several hours.  Feel nauseous or vomit.  Have a sore throat if you had a breathing tube during the procedure.  Follow these instructions at home: For at least 24 hours after the procedure:  Do not: ? Participate in activities in which  you could fall or become injured. ? Drive. ? Use heavy machinery. ? Drink alcohol. ? Take sleeping pills or medicines that cause drowsiness. ? Make important decisions or sign legal documents. ? Take care of children on your own.  Rest. Eating and drinking  Follow the diet that is recommended by your health care provider.  If you vomit, drink water, juice, or soup when you can drink without vomiting.  Make sure you have little or no nausea before eating solid foods. General instructions  Have a responsible adult stay with you until you are awake and alert.  Take over-the-counter and prescription medicines only as told by your health care provider.  If you smoke, do not smoke without supervision.  Keep all follow-up visits as told by your health care provider. This is important. Contact a health care provider if:  You keep feeling nauseous or you keep vomiting.  You feel light-headed.  You develop a rash.  You have a fever. Get help right away if:  You have trouble breathing. This information is not intended to replace advice given to you by your health care provider. Make sure you discuss any questions you have with your health care provider. Document Released: 06/22/2015 Document Revised: 10/22/2015 Document Reviewed: 06/22/2015 Elsevier Interactive Patient Education  [employer].

## 2017-12-13 NOTE — Addendum Note (Signed)
Addended by: Mahala Menghini on: 12/13/2017 05:41 PM   Modules accepted: Orders

## 2017-12-14 ENCOUNTER — Other Ambulatory Visit (HOSPITAL_COMMUNITY): Payer: Self-pay | Admitting: Chiropractic Medicine

## 2017-12-14 DIAGNOSIS — I10 Essential (primary) hypertension: Secondary | ICD-10-CM | POA: Diagnosis not present

## 2017-12-14 DIAGNOSIS — N183 Chronic kidney disease, stage 3 (moderate): Secondary | ICD-10-CM | POA: Diagnosis not present

## 2017-12-14 DIAGNOSIS — M48061 Spinal stenosis, lumbar region without neurogenic claudication: Secondary | ICD-10-CM | POA: Diagnosis not present

## 2017-12-14 DIAGNOSIS — R944 Abnormal results of kidney function studies: Secondary | ICD-10-CM | POA: Diagnosis not present

## 2017-12-14 DIAGNOSIS — J4551 Severe persistent asthma with (acute) exacerbation: Secondary | ICD-10-CM | POA: Diagnosis not present

## 2017-12-14 DIAGNOSIS — J4542 Moderate persistent asthma with status asthmaticus: Secondary | ICD-10-CM | POA: Diagnosis not present

## 2017-12-14 DIAGNOSIS — Z23 Encounter for immunization: Secondary | ICD-10-CM | POA: Diagnosis not present

## 2017-12-14 DIAGNOSIS — M5417 Radiculopathy, lumbosacral region: Secondary | ICD-10-CM

## 2017-12-14 DIAGNOSIS — M5126 Other intervertebral disc displacement, lumbar region: Secondary | ICD-10-CM | POA: Diagnosis not present

## 2017-12-14 NOTE — Telephone Encounter (Signed)
Pt notified that she should d/c Amitiza and start Linzess.

## 2017-12-14 NOTE — Telephone Encounter (Signed)
Lmom, waiting on a return call.  

## 2017-12-16 ENCOUNTER — Encounter (HOSPITAL_COMMUNITY)
Admission: RE | Admit: 2017-12-16 | Discharge: 2017-12-16 | Disposition: A | Discharge status: Home / Self Care | Payer: PPO | Source: Ambulatory Visit | Attending: Internal Medicine | Admitting: Internal Medicine

## 2017-12-16 DIAGNOSIS — Z01818 Encounter for other preprocedural examination: Secondary | ICD-10-CM | POA: Insufficient documentation

## 2017-12-16 LAB — BASIC METABOLIC PANEL
ANION GAP: 8 (ref 5–15)
BUN: 14 mg/dL (ref 8–23)
CALCIUM: 9 mg/dL (ref 8.9–10.3)
CO2: 23 mmol/L (ref 22–32)
Chloride: 105 mmol/L (ref 98–111)
Creatinine, Ser: 1.39 mg/dL — ABNORMAL HIGH (ref 0.44–1.00)
GFR, EST AFRICAN AMERICAN: 42 mL/min — AB (ref >60)
GFR, EST NON AFRICAN AMERICAN: 36 mL/min — AB (ref >60)
Glucose, Bld: 106 mg/dL — ABNORMAL HIGH (ref 70–99)
Potassium: 3.8 mmol/L (ref 3.5–5.1)
SODIUM: 136 mmol/L (ref 135–145)

## 2017-12-16 LAB — CBC WITH DIFFERENTIAL/PLATELET
BASOS ABS: 0 10*3/uL (ref 0.0–0.1)
BASOS PCT: 0 %
EOS ABS: 0 10*3/uL (ref 0.0–0.7)
Eosinophils Relative: 0 %
HEMATOCRIT: 33.7 % — AB (ref 36.0–46.0)
Hemoglobin: 10.3 g/dL — ABNORMAL LOW (ref 12.0–15.0)
Lymphocytes Relative: 36 %
Lymphs Abs: 2.6 10*3/uL (ref 0.7–4.0)
MCH: 24.3 pg — ABNORMAL LOW (ref 26.0–34.0)
MCHC: 30.6 g/dL (ref 30.0–36.0)
MCV: 79.5 fL (ref 78.0–100.0)
MONO ABS: 0.5 10*3/uL (ref 0.1–1.0)
Monocytes Relative: 7 %
NEUTROS ABS: 4.2 10*3/uL (ref 1.7–7.7)
NEUTROS PCT: 57 %
Platelets: 279 10*3/uL (ref 150–400)
RBC: 4.24 MIL/uL (ref 3.87–5.11)
RDW: 15.9 % — ABNORMAL HIGH (ref 11.5–15.5)
WBC: 7.3 10*3/uL (ref 4.0–10.5)

## 2017-12-19 NOTE — Progress Notes (Signed)
cc'd to pcp 

## 2017-12-20 ENCOUNTER — Ambulatory Visit (HOSPITAL_COMMUNITY)
Admission: RE | Admit: 2017-12-20 | Discharge: 2017-12-20 | Disposition: A | Discharge status: Home / Self Care | Payer: PPO | Source: Ambulatory Visit | Attending: Chiropractic Medicine | Admitting: Chiropractic Medicine

## 2017-12-20 DIAGNOSIS — M4854XA Collapsed vertebra, not elsewhere classified, thoracic region, initial encounter for fracture: Secondary | ICD-10-CM | POA: Diagnosis not present

## 2017-12-20 DIAGNOSIS — M5417 Radiculopathy, lumbosacral region: Secondary | ICD-10-CM

## 2017-12-20 DIAGNOSIS — M4725 Other spondylosis with radiculopathy, thoracolumbar region: Secondary | ICD-10-CM | POA: Insufficient documentation

## 2017-12-20 DIAGNOSIS — M5116 Intervertebral disc disorders with radiculopathy, lumbar region: Secondary | ICD-10-CM | POA: Diagnosis not present

## 2017-12-22 ENCOUNTER — Encounter (HOSPITAL_COMMUNITY)
Admission: RE | Disposition: A | Discharge status: Home / Self Care | Payer: Self-pay | Source: Ambulatory Visit | Attending: Internal Medicine

## 2017-12-22 ENCOUNTER — Ambulatory Visit (HOSPITAL_COMMUNITY): Payer: PPO | Admitting: Anesthesiology

## 2017-12-22 ENCOUNTER — Ambulatory Visit (HOSPITAL_COMMUNITY)
Admission: RE | Admit: 2017-12-22 | Discharge: 2017-12-22 | Disposition: A | Discharge status: Home / Self Care | Payer: PPO | Source: Ambulatory Visit | Attending: Internal Medicine | Admitting: Internal Medicine

## 2017-12-22 DIAGNOSIS — I129 Hypertensive chronic kidney disease with stage 1 through stage 4 chronic kidney disease, or unspecified chronic kidney disease: Secondary | ICD-10-CM | POA: Diagnosis not present

## 2017-12-22 DIAGNOSIS — G8929 Other chronic pain: Secondary | ICD-10-CM | POA: Insufficient documentation

## 2017-12-22 DIAGNOSIS — N183 Chronic kidney disease, stage 3 (moderate): Secondary | ICD-10-CM | POA: Diagnosis not present

## 2017-12-22 DIAGNOSIS — K219 Gastro-esophageal reflux disease without esophagitis: Secondary | ICD-10-CM | POA: Diagnosis not present

## 2017-12-22 DIAGNOSIS — Z86010 Personal history of colonic polyps: Secondary | ICD-10-CM | POA: Insufficient documentation

## 2017-12-22 DIAGNOSIS — Z96651 Presence of right artificial knee joint: Secondary | ICD-10-CM | POA: Insufficient documentation

## 2017-12-22 DIAGNOSIS — F419 Anxiety disorder, unspecified: Secondary | ICD-10-CM | POA: Insufficient documentation

## 2017-12-22 DIAGNOSIS — J449 Chronic obstructive pulmonary disease, unspecified: Secondary | ICD-10-CM | POA: Insufficient documentation

## 2017-12-22 DIAGNOSIS — D509 Iron deficiency anemia, unspecified: Secondary | ICD-10-CM | POA: Diagnosis not present

## 2017-12-22 DIAGNOSIS — K5900 Constipation, unspecified: Secondary | ICD-10-CM | POA: Insufficient documentation

## 2017-12-22 DIAGNOSIS — Z7951 Long term (current) use of inhaled steroids: Secondary | ICD-10-CM | POA: Insufficient documentation

## 2017-12-22 DIAGNOSIS — M549 Dorsalgia, unspecified: Secondary | ICD-10-CM | POA: Insufficient documentation

## 2017-12-22 DIAGNOSIS — D124 Benign neoplasm of descending colon: Secondary | ICD-10-CM | POA: Insufficient documentation

## 2017-12-22 DIAGNOSIS — K5730 Diverticulosis of large intestine without perforation or abscess without bleeding: Secondary | ICD-10-CM | POA: Insufficient documentation

## 2017-12-22 DIAGNOSIS — Z800 Family history of malignant neoplasm of digestive organs: Secondary | ICD-10-CM | POA: Insufficient documentation

## 2017-12-22 DIAGNOSIS — Z79899 Other long term (current) drug therapy: Secondary | ICD-10-CM | POA: Diagnosis not present

## 2017-12-22 DIAGNOSIS — Z87891 Personal history of nicotine dependence: Secondary | ICD-10-CM | POA: Diagnosis not present

## 2017-12-22 HISTORY — PX: COLONOSCOPY WITH PROPOFOL: SHX5780

## 2017-12-22 HISTORY — PX: POLYPECTOMY: SHX5525

## 2017-12-22 SURGERY — COLONOSCOPY WITH PROPOFOL
Anesthesia: General

## 2017-12-22 MED ORDER — HYDROCODONE-ACETAMINOPHEN 7.5-325 MG PO TABS: 1.0000 | ORAL_TABLET | Freq: Once | ORAL | Status: DC | PRN

## 2017-12-22 MED ORDER — PROPOFOL 10 MG/ML IV BOLUS
INTRAVENOUS | Status: AC
  Filled 2017-12-22: qty 60

## 2017-12-22 MED ORDER — MIDAZOLAM HCL 5 MG/5ML IJ SOLN
INTRAMUSCULAR | Status: DC | PRN
  Administered 2017-12-22: 2 mg via INTRAVENOUS

## 2017-12-22 MED ORDER — CHLORHEXIDINE GLUCONATE CLOTH 2 % EX PADS: 6.0000 | MEDICATED_PAD | Freq: Once | CUTANEOUS | Status: DC

## 2017-12-22 MED ORDER — STERILE WATER FOR IRRIGATION IR SOLN
Status: DC | PRN
  Administered 2017-12-22: 1.5 mL

## 2017-12-22 MED ORDER — HYDROMORPHONE HCL 1 MG/ML IJ SOLN: 0.2500 mg | INTRAMUSCULAR | Status: DC | PRN

## 2017-12-22 MED ORDER — PROPOFOL 500 MG/50ML IV EMUL
INTRAVENOUS | Status: DC | PRN
  Administered 2017-12-22: 125 ug/kg/min via INTRAVENOUS
  Administered 2017-12-22: 08:00:00 via INTRAVENOUS

## 2017-12-22 MED ORDER — EPHEDRINE SULFATE 50 MG/ML IJ SOLN
INTRAMUSCULAR | Status: DC | PRN
  Administered 2017-12-22: 20 mg via INTRAVENOUS

## 2017-12-22 MED ORDER — PROMETHAZINE HCL 25 MG/ML IJ SOLN: 6.2500 mg | INTRAMUSCULAR | Status: DC | PRN

## 2017-12-22 MED ORDER — LACTATED RINGERS IV SOLN
INTRAVENOUS | Status: DC
  Administered 2017-12-22: 08:00:00 via INTRAVENOUS

## 2017-12-22 MED ORDER — MIDAZOLAM HCL 2 MG/2ML IJ SOLN
INTRAMUSCULAR | Status: AC
  Filled 2017-12-22: qty 2

## 2017-12-22 MED ORDER — MIDAZOLAM HCL 2 MG/2ML IJ SOLN: 0.5000 mg | Freq: Once | INTRAMUSCULAR | Status: DC | PRN

## 2017-12-22 MED ORDER — LACTATED RINGERS IV SOLN
INTRAVENOUS | Status: DC | PRN
  Administered 2017-12-22: 07:00:00 via INTRAVENOUS

## 2017-12-22 NOTE — Anesthesia Postprocedure Evaluation (Signed)
Anesthesia Post Note  Patient: Glenda Terry  Procedure(s) Performed: COLONOSCOPY WITH PROPOFOL (N/A ) POLYPECTOMY  Patient location during evaluation: PACU Anesthesia Type: MAC Level of consciousness: awake and alert and patient cooperative Pain management: pain level controlled Vital Signs Assessment: post-procedure vital signs reviewed and stable Respiratory status: spontaneous breathing, nonlabored ventilation and respiratory function stable Cardiovascular status: blood pressure returned to baseline Postop Assessment: no apparent nausea or vomiting Anesthetic complications: no     Last Vitals:  Vitals:   12/22/17 0710 12/22/17 0717  BP: 134/76   Pulse: 77   Resp: 18   Temp:  36.6 C  SpO2: 96%     Last Pain:  Vitals:   12/22/17 0739  PainSc: 2                  Leasia Swann J

## 2017-12-22 NOTE — Transfer of Care (Signed)
Immediate Anesthesia Transfer of Care Note  Patient: Glenda Terry  Procedure(s) Performed: COLONOSCOPY WITH PROPOFOL (N/A ) POLYPECTOMY  Patient Location: PACU  Anesthesia Type:MAC  Level of Consciousness: drowsy  Airway & Oxygen Therapy: Patient Spontanous Breathing and Patient connected to nasal cannula oxygen  Post-op Assessment: Report given to RN, Post -op Vital signs reviewed and stable and Patient moving all extremities  Post vital signs: Reviewed and stable  Last Vitals:  Vitals Value Taken Time  BP    Temp    Pulse    Resp    SpO2      Last Pain:  Vitals:   12/22/17 0739  PainSc: 2       Patients Stated Pain Goal: 5 (03/00/92 3300)  Complications: No apparent anesthesia complications

## 2017-12-22 NOTE — Discharge Instructions (Signed)
Colonoscopy Discharge Instructions  Read the instructions outlined below and refer to this sheet in the next few weeks. These discharge instructions provide you with general information on caring for yourself after you leave the hospital. Your doctor may also give you specific instructions. While your treatment has been planned according to the most current medical practices available, unavoidable complications occasionally occur. If you have any problems or questions after discharge, call Dr. Gala Romney at [phone]. ACTIVITY  You may resume your regular activity, but move at a slower pace for the next 24 hours.   Take frequent rest periods for the next 24 hours.   Walking will help get rid of the air and reduce the bloated feeling in your belly (abdomen).   No driving for 24 hours (because of the medicine (anesthesia) used during the test).    Do not sign any important legal documents or operate any machinery for 24 hours (because of the anesthesia used during the test).  NUTRITION  Drink plenty of fluids.   You may resume your normal diet as instructed by your doctor.   Begin with a light meal and progress to your normal diet. Heavy or fried foods are harder to digest and may make you feel sick to your stomach (nauseated).   Avoid alcoholic beverages for 24 hours or as instructed.  MEDICATIONS  You may resume your normal medications unless your doctor tells you otherwise.  WHAT YOU CAN EXPECT TODAY  Some feelings of bloating in the abdomen.   Passage of more gas than usual.   Spotting of blood in your stool or on the toilet paper.  IF YOU HAD POLYPS REMOVED DURING THE COLONOSCOPY:  No aspirin products for 7 days or as instructed.   No alcohol for 7 days or as instructed.   Eat a soft diet for the next 24 hours.  FINDING OUT THE RESULTS OF YOUR TEST Not all test results are available during your visit. If your test results are not back during the visit, make an appointment  with your caregiver to find out the results. Do not assume everything is normal if you have not heard from your caregiver or the medical facility. It is important for you to follow up on all of your test results.  SEEK IMMEDIATE MEDICAL ATTENTION IF:  You have more than a spotting of blood in your stool.   Your belly is swollen (abdominal distention).   You are nauseated or vomiting.   You have a temperature over 101.   You have abdominal pain or discomfort that is severe or gets worse throughout the day.   Colon Polyps Polyps are tissue growths inside the body. Polyps can grow in many places, including the large intestine (colon). A polyp may be a round bump or a mushroom-shaped growth. You could have one polyp or several. Most colon polyps are noncancerous (benign). However, some colon polyps can become cancerous over time. What are the causes? The exact cause of colon polyps is not known. What increases the risk? This condition is more likely to develop in people who:  Have a family history of colon cancer or colon polyps.  Are older than 29 or older than 45 if they are African American.  Have inflammatory bowel disease, such as ulcerative colitis or Crohn disease.  Are overweight.  Smoke cigarettes.  Do not get enough exercise.  Drink too much alcohol.  Eat a diet that is: ? High in fat and red meat. ? Low in fiber.  Had childhood cancer that was treated with abdominal radiation. ° °What are the signs or symptoms? °Most polyps do not cause symptoms. If you have symptoms, they may include: °· Blood coming from your rectum when having a bowel movement. °· Blood in your stool. The stool may look dark red or black. °· A change in bowel habits, such as constipation or diarrhea. ° °How is this diagnosed? °This condition is diagnosed with a colonoscopy. This is a procedure that uses a lighted, flexible scope to look at the inside of your colon. °How is this treated? °Treatment for  this condition involves removing any polyps that are found. Those polyps will then be tested for cancer. If cancer is found, your health care provider will talk to you about options for colon cancer treatment. °Follow these instructions at home: °Diet °· Eat plenty of fiber, such as fruits, vegetables, and whole grains. °· Eat foods that are high in calcium and vitamin D, such as milk, cheese, yogurt, eggs, liver, fish, and broccoli. °· Limit foods high in fat, red meats, and processed meats, such as hot dogs, sausage, bacon, and lunch meats. °· Maintain a healthy weight, or lose weight if recommended by your health care provider. °General instructions °· Do not smoke cigarettes. °· Do not drink alcohol excessively. °· Keep all follow-up visits as told by your health care provider. This is important. This includes keeping regularly scheduled colonoscopies. Talk to your health care provider about when you need a colonoscopy. °· Exercise every day or as told by your health care provider. °Contact a health care provider if: °· You have new or worsening bleeding during a bowel movement. °· You have new or increased blood in your stool. °· You have a change in bowel habits. °· You unexpectedly lose weight. °This information is not intended to replace advice given to you by your health care provider. Make sure you discuss any questions you have with your health care provider. °Document Released: 11/26/2003 Document Revised: 08/07/2015 Document Reviewed: 01/20/2015 °Elsevier Interactive Patient Education © 2018 [employer] ° °Diverticulosis °Diverticulosis is a condition that develops when small pouches (diverticula) form in the wall of the large intestine (colon). The colon is where water is absorbed and stool is formed. The pouches form when the inside layer of the colon pushes through weak spots in the outer layers of the colon. You may have a few pouches or many of them. °What are the causes? °The cause of this  condition is not known. °What increases the risk? °The following factors may make you more likely to develop this condition: °· Being older than age 60. Your risk for this condition increases with age. Diverticulosis is rare among people younger than age 30. By age 80, many people have it. °· Eating a low-fiber diet. °· Having frequent constipation. °· Being overweight. °· Not getting enough exercise. °· Smoking. °· Taking over-the-counter pain medicines, like aspirin and ibuprofen. °· Having a family history of diverticulosis. ° °What are the signs or symptoms? °In most people, there are no symptoms of this condition. If you do have symptoms, they may include: °· Bloating. °· Cramps in the abdomen. °· Constipation or diarrhea. °· Pain in the lower left side of the abdomen. ° °How is this diagnosed? °This condition is most often diagnosed during an exam for other colon problems. Because diverticulosis usually has no symptoms, it often cannot be diagnosed independently. This condition may be diagnosed by: °· Using a flexible scope to examine the   colon (colonoscopy). °· Taking an X-ray of the colon after dye has been put into the colon (barium enema). °· Doing a CT scan. ° °How is this treated? °You may not need treatment for this condition if you have never developed an infection related to diverticulosis. If you have had an infection before, treatment may include: °· Eating a high-fiber diet. This may include eating more fruits, vegetables, and grains. °· Taking a fiber supplement. °· Taking a live bacteria supplement (probiotic). °· Taking medicine to relax your colon. °· Taking antibiotic medicines. ° °Follow these instructions at home: °· Drink 6-8 glasses of water or more each day to prevent constipation. °· Try not to strain when you have a bowel movement. °· If you have had an infection before: °? Eat more fiber as directed by your health care provider or your diet and nutrition specialist (dietitian). °? Take  a fiber supplement or probiotic, if your health care provider approves. °· Take over-the-counter and prescription medicines only as told by your health care provider. °· If you were prescribed an antibiotic, take it as told by your health care provider. Do not stop taking the antibiotic even if you start to feel better. °· Keep all follow-up visits as told by your health care provider. This is important. °Contact a health care provider if: °· You have pain in your abdomen. °· You have bloating. °· You have cramps. °· You have not had a bowel movement in 3 days. °Get help right away if: °· Your pain gets worse. °· Your bloating becomes very bad. °· You have a fever or chills, and your symptoms suddenly get worse. °· You vomit. °· You have bowel movements that are bloody or black. °· You have bleeding from your rectum. °Summary °· Diverticulosis is a condition that develops when small pouches (diverticula) form in the wall of the large intestine (colon). °· You may have a few pouches or many of them. °· This condition is most often diagnosed during an exam for other colon problems. °· If you have had an infection related to diverticulosis, treatment may include increasing the fiber in your diet, taking supplements, or taking medicines. °This information is not intended to replace advice given to you by your health care provider. Make sure you discuss any questions you have with your health care provider. °Document Released: 11/27/2003 Document Revised: 01/19/2016 Document Reviewed: 01/19/2016 °Elsevier Interactive Patient Education © 2017 Elsevier Inc. ° ° °Colon polyp and diverticulosis information provided ° °Further recommendations to follow pending review of pathology report ° °

## 2017-12-22 NOTE — Op Note (Signed)
[facility] Patient Name: Glenda Terry Procedure Date: 12/22/2017 7:09 AM MRN: [id number] Date of Birth: [birth year] Attending MD: Norvel Richards , MD CSN: [id number] Age: 77 Admit Type: Outpatient Procedure:                Colonoscopy Indications:              High risk colon cancer surveillance: Personal                            history of colonic polyps Providers:                Norvel Richards, MD, Charlsie Quest. Theda Sers RN, RN,                            Aram Candela Referring MD:             Edwinna Areola. Hall MD Medicines:                Propofol per Anesthesia Complications:            No immediate complications. Estimated Blood Loss:     Estimated blood loss was minimal. Procedure:                Pre-Anesthesia Assessment:                           - Prior to the procedure, a History and Physical                            was performed, and patient medications and                            allergies were reviewed. The patient's tolerance of                            previous anesthesia was also reviewed. The risks                            and benefits of the procedure and the sedation                            options and risks were discussed with the patient.                            All questions were answered, and informed consent                            was obtained. Prior Anticoagulants: The patient has                            taken no previous anticoagulant or antiplatelet                            agents. ASA Grade Assessment: II - A patient with  mild systemic disease. After reviewing the risks                            and benefits, the patient was deemed in                            satisfactory condition to undergo the procedure.                           After obtaining informed consent, the colonoscope                            was passed under direct vision. Throughout the   procedure, the patient's blood pressure, pulse, and                            oxygen saturations were monitored continuously. The                            [id number]-[id number] ([id number]) scope was introduced through                            the and advanced to the the cecum, identified by                            appendiceal orifice and ileocecal valve. The                            colonoscopy was performed without difficulty. The                            patient tolerated the procedure well. The quality                            of the bowel preparation was adequate. The                            ileocecal valve, appendiceal orifice, and rectum                            were photographed. The entire colon was well                            visualized. Scope In: 7:40:53 AM Scope Out: 7:59:41 AM Scope Withdrawal Time: 0 hours 10 minutes 40 seconds  Total Procedure Duration: 0 hours 18 minutes 48 seconds  Findings:      The perianal and digital rectal examinations were normal.      Scattered small and large-mouthed diverticula were found in the sigmoid       colon and descending colon.      Two sessile polyps were found in the descending colon. The polyps were 5       to 6 mm in size. These polyps were removed with a cold snare. Resection       and retrieval were complete. Estimated blood loss was minimal.  The exam was otherwise without abnormality on direct and retroflexion       views. Impression:               - Diverticulosis in the sigmoid colon and in the                            descending colon.                           - Two 5 to 6 mm polyps in the descending colon,                            removed with a cold snare. Resected and retrieved.                           - The examination was otherwise normal on direct                            and retroflexion views. Moderate Sedation:      Moderate (conscious) sedation was administered by the endoscopy nurse        and supervised by the endoscopist. The following parameters were       monitored: oxygen saturation, heart rate, blood pressure, respiratory       rate, EKG, adequacy of pulmonary ventilation, and response to care.       Total physician intraservice time was 23 minutes. Recommendation:           - Patient has a contact number available for                            emergencies. The signs and symptoms of potential                            delayed complications were discussed with the                            patient. Return to normal activities tomorrow.                            Written discharge instructions were provided to the                            patient.                           - Resume previous diet.                           - Continue present medications.                           - Repeat colonoscopy date to be determined after                            pending pathology results are reviewed for  surveillance based on pathology results.                           - Return to GI office (date not yet determined). Procedure Code(s):        --- Professional ---                           [id number], Moderate sedation services provided by the                            same physician or other qualified health care                            professional performing the diagnostic or                            therapeutic service that the sedation supports,                            requiring the presence of an independent trained                            observer to assist in the monitoring of the                            patient's level of consciousness and physiological                            status; initial 15 minutes of intraservice time,                            patient age 73 years or older                           [phone], Moderate sedation; each additional 15                            minutes intraservice time Diagnosis Code(s):         --- Professional ---                           Z86.010, Personal history of colonic polyps                           D12.4, Benign neoplasm of descending colon                           K57.30, Diverticulosis of large intestine without                            perforation or abscess without bleeding CPT copyright 2018 American Medical Association. All rights reserved. The codes documented in this report are preliminary and upon coder review may  be revised to meet current compliance requirements. Cristopher Estimable. Vondell Babers, MD Norvel Richards, MD 12/22/2017 8:09:01 AM This report has  been signed electronically. Number of Addenda: 0

## 2017-12-22 NOTE — Interval H&P Note (Signed)
History and Physical Interval Note:  12/22/2017 7:24 AM  Glenda Terry  has presented today for surgery, with the diagnosis of history colon polyps, family history colorectal cancer  The various methods of treatment have been discussed with the patient and family. After consideration of risks, benefits and other options for treatment, the patient has consented to  Procedure(s) with comments: COLONOSCOPY WITH PROPOFOL (N/A) - 7:30am as a surgical intervention .  The patient's history has been reviewed, patient examined, no change in status, stable for surgery.  I have reviewed the patient's chart and labs.  Questions were answered to the patient's satisfaction.     Glenda Terry  No change.  Surveillance colonoscopy per plan.  The risks, benefits, limitations, alternatives and imponderables have been reviewed with the patient. Questions have been answered. All parties are agreeable.

## 2017-12-22 NOTE — Anesthesia Preprocedure Evaluation (Signed)
Anesthesia Evaluation  Patient identified by MRN, date of birth, ID band Patient awake    Reviewed: Allergy & Precautions, NPO status , Patient's Chart, lab work & pertinent test results  Airway Mallampati: I  TM Distance: >3 FB Neck ROM: Full    Dental no notable dental hx. (+) Edentulous Upper, Edentulous Lower   Pulmonary asthma , COPD,  COPD inhaler, former smoker,    Pulmonary exam normal breath sounds clear to auscultation       Cardiovascular Exercise Tolerance: Poor hypertension, Pt. on medications negative cardio ROS Normal cardiovascular examII Rhythm:Regular Rate:Normal  Denies CP / slight DOE Uses cane   Neuro/Psych Anxiety negative neurological ROS  negative psych ROS   GI/Hepatic Neg liver ROS, GERD  Medicated and Controlled,  Endo/Other  negative endocrine ROS  Renal/GU Renal InsufficiencyRenal disease  negative genitourinary   Musculoskeletal  (+) Arthritis , Osteoarthritis,    Abdominal   Peds negative pediatric ROS (+)  Hematology negative hematology ROS (+) anemia ,   Anesthesia Other Findings   Reproductive/Obstetrics negative OB ROS                             Anesthesia Physical Anesthesia Plan  ASA: II  Anesthesia Plan: General   Post-op Pain Management:    Induction: Intravenous  PONV Risk Score and Plan:   Airway Management Planned: Nasal Cannula and Simple Face Mask  Additional Equipment:   Intra-op Plan:   Post-operative Plan:   Informed Consent: I have reviewed the patients History and Physical, chart, labs and discussed the procedure including the risks, benefits and alternatives for the proposed anesthesia with the patient or authorized representative who has indicated his/her understanding and acceptance.   Dental advisory given  Plan Discussed with: CRNA  Anesthesia Plan Comments:         Anesthesia Quick Evaluation

## 2017-12-23 ENCOUNTER — Encounter: Payer: Self-pay | Admitting: Internal Medicine

## 2017-12-28 ENCOUNTER — Encounter (HOSPITAL_COMMUNITY): Payer: Self-pay | Admitting: Internal Medicine

## 2017-12-29 DIAGNOSIS — M545 Low back pain: Secondary | ICD-10-CM | POA: Diagnosis not present

## 2017-12-29 DIAGNOSIS — R262 Difficulty in walking, not elsewhere classified: Secondary | ICD-10-CM | POA: Diagnosis not present

## 2017-12-29 DIAGNOSIS — M6281 Muscle weakness (generalized): Secondary | ICD-10-CM | POA: Diagnosis not present

## 2017-12-29 DIAGNOSIS — M5489 Other dorsalgia: Secondary | ICD-10-CM | POA: Diagnosis not present

## 2017-12-30 DIAGNOSIS — M6281 Muscle weakness (generalized): Secondary | ICD-10-CM | POA: Diagnosis not present

## 2017-12-30 DIAGNOSIS — R262 Difficulty in walking, not elsewhere classified: Secondary | ICD-10-CM | POA: Diagnosis not present

## 2017-12-30 DIAGNOSIS — M5489 Other dorsalgia: Secondary | ICD-10-CM | POA: Diagnosis not present

## 2017-12-30 DIAGNOSIS — M545 Low back pain: Secondary | ICD-10-CM | POA: Diagnosis not present

## 2018-01-02 DIAGNOSIS — M6281 Muscle weakness (generalized): Secondary | ICD-10-CM | POA: Diagnosis not present

## 2018-01-02 DIAGNOSIS — M545 Low back pain: Secondary | ICD-10-CM | POA: Diagnosis not present

## 2018-01-02 DIAGNOSIS — M5489 Other dorsalgia: Secondary | ICD-10-CM | POA: Diagnosis not present

## 2018-01-02 DIAGNOSIS — Z6835 Body mass index (BMI) 35.0-35.9, adult: Secondary | ICD-10-CM | POA: Diagnosis not present

## 2018-01-02 DIAGNOSIS — M79644 Pain in right finger(s): Secondary | ICD-10-CM | POA: Diagnosis not present

## 2018-01-02 DIAGNOSIS — R262 Difficulty in walking, not elsewhere classified: Secondary | ICD-10-CM | POA: Diagnosis not present

## 2018-01-04 DIAGNOSIS — M5489 Other dorsalgia: Secondary | ICD-10-CM | POA: Diagnosis not present

## 2018-01-04 DIAGNOSIS — M545 Low back pain: Secondary | ICD-10-CM | POA: Diagnosis not present

## 2018-01-04 DIAGNOSIS — M6281 Muscle weakness (generalized): Secondary | ICD-10-CM | POA: Diagnosis not present

## 2018-01-04 DIAGNOSIS — R262 Difficulty in walking, not elsewhere classified: Secondary | ICD-10-CM | POA: Diagnosis not present

## 2018-01-05 DIAGNOSIS — R262 Difficulty in walking, not elsewhere classified: Secondary | ICD-10-CM | POA: Diagnosis not present

## 2018-01-05 DIAGNOSIS — M6281 Muscle weakness (generalized): Secondary | ICD-10-CM | POA: Diagnosis not present

## 2018-01-05 DIAGNOSIS — M5489 Other dorsalgia: Secondary | ICD-10-CM | POA: Diagnosis not present

## 2018-01-05 DIAGNOSIS — M545 Low back pain: Secondary | ICD-10-CM | POA: Diagnosis not present

## 2018-01-09 ENCOUNTER — Other Ambulatory Visit: Payer: Self-pay | Admitting: Gastroenterology

## 2018-01-10 DIAGNOSIS — M545 Low back pain: Secondary | ICD-10-CM | POA: Diagnosis not present

## 2018-01-10 DIAGNOSIS — M6281 Muscle weakness (generalized): Secondary | ICD-10-CM | POA: Diagnosis not present

## 2018-01-10 DIAGNOSIS — R262 Difficulty in walking, not elsewhere classified: Secondary | ICD-10-CM | POA: Diagnosis not present

## 2018-01-10 DIAGNOSIS — M5489 Other dorsalgia: Secondary | ICD-10-CM | POA: Diagnosis not present

## 2018-01-11 DIAGNOSIS — R262 Difficulty in walking, not elsewhere classified: Secondary | ICD-10-CM | POA: Diagnosis not present

## 2018-01-11 DIAGNOSIS — M545 Low back pain: Secondary | ICD-10-CM | POA: Diagnosis not present

## 2018-01-11 DIAGNOSIS — M5489 Other dorsalgia: Secondary | ICD-10-CM | POA: Diagnosis not present

## 2018-01-11 DIAGNOSIS — M6281 Muscle weakness (generalized): Secondary | ICD-10-CM | POA: Diagnosis not present

## 2018-01-12 DIAGNOSIS — M545 Low back pain: Secondary | ICD-10-CM | POA: Diagnosis not present

## 2018-01-12 DIAGNOSIS — M5489 Other dorsalgia: Secondary | ICD-10-CM | POA: Diagnosis not present

## 2018-01-12 DIAGNOSIS — R262 Difficulty in walking, not elsewhere classified: Secondary | ICD-10-CM | POA: Diagnosis not present

## 2018-01-12 DIAGNOSIS — M6281 Muscle weakness (generalized): Secondary | ICD-10-CM | POA: Diagnosis not present

## 2018-01-16 DIAGNOSIS — M545 Low back pain: Secondary | ICD-10-CM | POA: Diagnosis not present

## 2018-01-16 DIAGNOSIS — R262 Difficulty in walking, not elsewhere classified: Secondary | ICD-10-CM | POA: Diagnosis not present

## 2018-01-16 DIAGNOSIS — M5489 Other dorsalgia: Secondary | ICD-10-CM | POA: Diagnosis not present

## 2018-01-16 DIAGNOSIS — M6281 Muscle weakness (generalized): Secondary | ICD-10-CM | POA: Diagnosis not present

## 2018-01-18 DIAGNOSIS — R262 Difficulty in walking, not elsewhere classified: Secondary | ICD-10-CM | POA: Diagnosis not present

## 2018-01-18 DIAGNOSIS — M5489 Other dorsalgia: Secondary | ICD-10-CM | POA: Diagnosis not present

## 2018-01-18 DIAGNOSIS — M545 Low back pain: Secondary | ICD-10-CM | POA: Diagnosis not present

## 2018-01-18 DIAGNOSIS — M6281 Muscle weakness (generalized): Secondary | ICD-10-CM | POA: Diagnosis not present

## 2018-01-19 DIAGNOSIS — M069 Rheumatoid arthritis, unspecified: Secondary | ICD-10-CM | POA: Diagnosis not present

## 2018-01-24 ENCOUNTER — Other Ambulatory Visit: Payer: Self-pay | Admitting: *Deleted

## 2018-01-24 DIAGNOSIS — M545 Low back pain: Secondary | ICD-10-CM | POA: Diagnosis not present

## 2018-01-24 DIAGNOSIS — M5489 Other dorsalgia: Secondary | ICD-10-CM | POA: Diagnosis not present

## 2018-01-24 DIAGNOSIS — R262 Difficulty in walking, not elsewhere classified: Secondary | ICD-10-CM | POA: Diagnosis not present

## 2018-01-24 DIAGNOSIS — M6281 Muscle weakness (generalized): Secondary | ICD-10-CM | POA: Diagnosis not present

## 2018-01-24 NOTE — Patient Outreach (Signed)
[facility]) Care Management  01/24/2018  Glenda Terry 21-Aug-1940 [id number]   Telephone Screen  Referral Date: 01/20/18 Referral Source:  Nurse call center Referral Reason: headache on both sides started this morning  pain is going down he neck nose is stuffed up slept goo Too benadryl 5 or 6 am allegra, Singulair, and Flonase around 815 without relief ([place])- Recommended she see pcp-refused recommendation to go to facility  Insurance: HTA   Outreach attempt # 1 successful to the home number  Patient is able to verify HIPAA Reviewed and addressed referral to [facility] with patient Ms Zervas informs Cm she is doing well, her headache got better after she did what the nurse recommended She took tylenol and another benadryl and got to feeling better Ms rebekkah powless [facility] RN CM for call and checking on her   Social: Ms Bangert is single with daughters and a son for support Denies concerns with her care and transportation to appointments   Conditions: HTN, asthma, acid reflux, anxiety, seasonal allergies, hemorrhoids, COPD, asthma, weakness of right leg, arthritis, osteoarthritis of knee, rheumatoid arthritis, CKD, anxiety, difficulty in walking     Medications: denies concerns with taking medications as prescribed, affording medications, side effects of medications and questions about medications   Advance Directives: Denies need for assist with advance directives   Consent: [facility] RN CM reviewed [facility] services with patient. Patient gave verbal consent for services.  Plan: [facility] RN CM will close case at this time as patient has been assessed and no needs identified.    Kohan Azizi L. Lavina Hamman, RN, BSN, [place] Coordinator Office number [phone] Mobile number [phone]  Main [facility] number [phone] Fax number [phone]

## 2018-02-03 DIAGNOSIS — J449 Chronic obstructive pulmonary disease, unspecified: Secondary | ICD-10-CM | POA: Diagnosis not present

## 2018-02-03 DIAGNOSIS — J301 Allergic rhinitis due to pollen: Secondary | ICD-10-CM | POA: Diagnosis not present

## 2018-02-03 DIAGNOSIS — D721 Eosinophilia: Secondary | ICD-10-CM | POA: Diagnosis not present

## 2018-02-03 DIAGNOSIS — Z87891 Personal history of nicotine dependence: Secondary | ICD-10-CM | POA: Diagnosis not present

## 2018-02-03 DIAGNOSIS — J4550 Severe persistent asthma, uncomplicated: Secondary | ICD-10-CM | POA: Diagnosis not present

## 2018-02-03 DIAGNOSIS — J82 Pulmonary eosinophilia, not elsewhere classified: Secondary | ICD-10-CM | POA: Diagnosis not present

## 2018-02-03 DIAGNOSIS — J411 Mucopurulent chronic bronchitis: Secondary | ICD-10-CM | POA: Diagnosis not present

## 2018-02-06 DIAGNOSIS — M7989 Other specified soft tissue disorders: Secondary | ICD-10-CM | POA: Diagnosis not present

## 2018-02-06 DIAGNOSIS — M0579 Rheumatoid arthritis with rheumatoid factor of multiple sites without organ or systems involvement: Secondary | ICD-10-CM | POA: Diagnosis not present

## 2018-02-06 DIAGNOSIS — E669 Obesity, unspecified: Secondary | ICD-10-CM | POA: Diagnosis not present

## 2018-02-06 DIAGNOSIS — M150 Primary generalized (osteo)arthritis: Secondary | ICD-10-CM | POA: Diagnosis not present

## 2018-02-06 DIAGNOSIS — R5382 Chronic fatigue, unspecified: Secondary | ICD-10-CM | POA: Diagnosis not present

## 2018-02-06 DIAGNOSIS — M2550 Pain in unspecified joint: Secondary | ICD-10-CM | POA: Diagnosis not present

## 2018-02-06 DIAGNOSIS — Z6835 Body mass index (BMI) 35.0-35.9, adult: Secondary | ICD-10-CM | POA: Diagnosis not present

## 2018-02-13 DIAGNOSIS — M48061 Spinal stenosis, lumbar region without neurogenic claudication: Secondary | ICD-10-CM | POA: Diagnosis not present

## 2018-02-13 DIAGNOSIS — M6530 Trigger finger, unspecified finger: Secondary | ICD-10-CM | POA: Diagnosis not present

## 2018-02-16 DIAGNOSIS — Z96651 Presence of right artificial knee joint: Secondary | ICD-10-CM | POA: Insufficient documentation

## 2018-02-16 DIAGNOSIS — M1712 Unilateral primary osteoarthritis, left knee: Secondary | ICD-10-CM | POA: Diagnosis not present

## 2018-02-16 DIAGNOSIS — M25562 Pain in left knee: Secondary | ICD-10-CM | POA: Diagnosis not present

## 2018-02-16 DIAGNOSIS — M25561 Pain in right knee: Secondary | ICD-10-CM | POA: Diagnosis not present

## 2018-02-17 DIAGNOSIS — R7301 Impaired fasting glucose: Secondary | ICD-10-CM | POA: Diagnosis not present

## 2018-02-17 DIAGNOSIS — E1122 Type 2 diabetes mellitus with diabetic chronic kidney disease: Secondary | ICD-10-CM | POA: Diagnosis not present

## 2018-02-17 DIAGNOSIS — D509 Iron deficiency anemia, unspecified: Secondary | ICD-10-CM | POA: Diagnosis not present

## 2018-02-17 DIAGNOSIS — I10 Essential (primary) hypertension: Secondary | ICD-10-CM | POA: Diagnosis not present

## 2018-02-17 DIAGNOSIS — E782 Mixed hyperlipidemia: Secondary | ICD-10-CM | POA: Diagnosis not present

## 2018-02-17 DIAGNOSIS — N183 Chronic kidney disease, stage 3 (moderate): Secondary | ICD-10-CM | POA: Diagnosis not present

## 2018-02-17 DIAGNOSIS — R944 Abnormal results of kidney function studies: Secondary | ICD-10-CM | POA: Diagnosis not present

## 2018-02-17 DIAGNOSIS — E249 Cushing's syndrome, unspecified: Secondary | ICD-10-CM | POA: Diagnosis not present

## 2018-02-22 DIAGNOSIS — N183 Chronic kidney disease, stage 3 (moderate): Secondary | ICD-10-CM | POA: Diagnosis not present

## 2018-02-22 DIAGNOSIS — E782 Mixed hyperlipidemia: Secondary | ICD-10-CM | POA: Diagnosis not present

## 2018-02-22 DIAGNOSIS — M859 Disorder of bone density and structure, unspecified: Secondary | ICD-10-CM | POA: Diagnosis not present

## 2018-02-22 DIAGNOSIS — J82 Pulmonary eosinophilia, not elsewhere classified: Secondary | ICD-10-CM | POA: Diagnosis not present

## 2018-02-22 DIAGNOSIS — M1990 Unspecified osteoarthritis, unspecified site: Secondary | ICD-10-CM | POA: Diagnosis not present

## 2018-02-22 DIAGNOSIS — I10 Essential (primary) hypertension: Secondary | ICD-10-CM | POA: Diagnosis not present

## 2018-02-22 DIAGNOSIS — F419 Anxiety disorder, unspecified: Secondary | ICD-10-CM | POA: Diagnosis not present

## 2018-02-22 DIAGNOSIS — Z8781 Personal history of (healed) traumatic fracture: Secondary | ICD-10-CM | POA: Diagnosis not present

## 2018-02-22 DIAGNOSIS — J441 Chronic obstructive pulmonary disease with (acute) exacerbation: Secondary | ICD-10-CM | POA: Diagnosis not present

## 2018-02-22 DIAGNOSIS — D509 Iron deficiency anemia, unspecified: Secondary | ICD-10-CM | POA: Diagnosis not present

## 2018-02-22 DIAGNOSIS — E1122 Type 2 diabetes mellitus with diabetic chronic kidney disease: Secondary | ICD-10-CM | POA: Diagnosis not present

## 2018-02-28 DIAGNOSIS — M5136 Other intervertebral disc degeneration, lumbar region: Secondary | ICD-10-CM | POA: Diagnosis not present

## 2018-03-01 ENCOUNTER — Encounter (HOSPITAL_COMMUNITY): Payer: Self-pay

## 2018-03-01 ENCOUNTER — Encounter (HOSPITAL_COMMUNITY)
Admission: RE | Admit: 2018-03-01 | Discharge: 2018-03-01 | Disposition: A | Discharge status: Home / Self Care | Payer: PPO | Source: Ambulatory Visit | Attending: Internal Medicine | Admitting: Internal Medicine

## 2018-03-01 DIAGNOSIS — M810 Age-related osteoporosis without current pathological fracture: Secondary | ICD-10-CM | POA: Insufficient documentation

## 2018-03-01 MED ORDER — DENOSUMAB 60 MG/ML ~~LOC~~ SOSY
60.0000 mg | PREFILLED_SYRINGE | Freq: Once | SUBCUTANEOUS | Status: AC
  Administered 2018-03-01: 60 mg via SUBCUTANEOUS
  Filled 2018-03-01: qty 1

## 2018-03-01 MED ORDER — DENOSUMAB 60 MG/ML ~~LOC~~ SOSY
PREFILLED_SYRINGE | SUBCUTANEOUS | Status: AC
  Filled 2018-03-01: qty 1

## 2018-03-01 NOTE — Discharge Instructions (Signed)
Denosumab injection °What is this medicine? °DENOSUMAB (den oh sue mab) slows bone breakdown. Prolia is used to treat osteoporosis in women after menopause and in men, and in people who are taking corticosteroids for 6 months or more. Xgeva is used to treat a high calcium level due to cancer and to prevent bone fractures and other bone problems caused by multiple myeloma or cancer bone metastases. Xgeva is also used to treat giant cell tumor of the bone. °This medicine may be used for other purposes; ask your health care provider or pharmacist if you have questions. °COMMON BRAND NAME(S): Prolia, XGEVA °What should I tell my health care provider before I take this medicine? °They need to know if you have any of these conditions: °-dental disease °-having surgery or tooth extraction °-infection °-kidney disease °-low levels of calcium or Vitamin D in the blood °-malnutrition °-on hemodialysis °-skin conditions or sensitivity °-thyroid or parathyroid disease °-an unusual reaction to denosumab, other medicines, foods, dyes, or preservatives °-pregnant or trying to get pregnant °-breast-feeding °How should I use this medicine? °This medicine is for injection under the skin. It is given by a health care professional in a hospital or clinic setting. °A special MedGuide will be given to you before each treatment. Be sure to read this information carefully each time. °For Prolia, talk to your pediatrician regarding the use of this medicine in children. Special care may be needed. For Xgeva, talk to your pediatrician regarding the use of this medicine in children. While this drug may be prescribed for children as young as 13 years for selected conditions, precautions do apply. °Overdosage: If you think you have taken too much of this medicine contact a poison control center or emergency room at once. °NOTE: This medicine is only for you. Do not share this medicine with others. °What if I miss a dose? °It is important not to  miss your dose. Call your doctor or health care professional if you are unable to keep an appointment. °What may interact with this medicine? °Do not take this medicine with any of the following medications: °-other medicines containing denosumab °This medicine may also interact with the following medications: °-medicines that lower your chance of fighting infection °-steroid medicines like prednisone or cortisone °This list may not describe all possible interactions. Give your health care provider a list of all the medicines, herbs, non-prescription drugs, or dietary supplements you use. Also tell them if you smoke, drink alcohol, or use illegal drugs. Some items may interact with your medicine. °What should I watch for while using this medicine? °Visit your doctor or health care professional for regular checks on your progress. Your doctor or health care professional may order blood tests and other tests to see how you are doing. °Call your doctor or health care professional for advice if you get a fever, chills or sore throat, or other symptoms of a cold or flu. Do not treat yourself. This drug may decrease your body's ability to fight infection. Try to avoid being around people who are sick. °You should make sure you get enough calcium and vitamin D while you are taking this medicine, unless your doctor tells you not to. Discuss the foods you eat and the vitamins you take with your health care professional. °See your dentist regularly. Brush and floss your teeth as directed. Before you have any dental work done, tell your dentist you are receiving this medicine. °Do not become pregnant while taking this medicine or for 5 months   after stopping it. Talk with your doctor or health care professional about your birth control options while taking this medicine. Women should inform their doctor if they wish to become pregnant or think they might be pregnant. There is a potential for serious side effects to an unborn  child. Talk to your health care professional or pharmacist for more information. °What side effects may I notice from receiving this medicine? °Side effects that you should report to your doctor or health care professional as soon as possible: °-allergic reactions like skin rash, itching or hives, swelling of the face, lips, or tongue °-bone pain °-breathing problems °-dizziness °-jaw pain, especially after dental work °-redness, blistering, peeling of the skin °-signs and symptoms of infection like fever or chills; cough; sore throat; pain or trouble passing urine °-signs of low calcium like fast heartbeat, muscle cramps or muscle pain; pain, tingling, numbness in the hands or feet; seizures °-unusual bleeding or bruising °-unusually weak or tired °Side effects that usually do not require medical attention (report to your doctor or health care professional if they continue or are bothersome): °-constipation °-diarrhea °-headache °-joint pain °-loss of appetite °-muscle pain °-runny nose °-tiredness °-upset stomach °This list may not describe all possible side effects. Call your doctor for medical advice about side effects. You may report side effects to FDA at 1-800-FDA-1088. °Where should I keep my medicine? °This medicine is only given in a clinic, doctor's office, or other health care setting and will not be stored at home. °NOTE: This sheet is a summary. It may not cover all possible information. If you have questions about this medicine, talk to your doctor, pharmacist, or health care provider. °© 2019 [employer] (2017-07-08 16:10:44) ° °

## 2018-03-01 NOTE — Progress Notes (Signed)
Patient educated regarding Prolia to include side effects.  Written material provided and verbalized understanding.

## 2018-03-06 DIAGNOSIS — J4551 Severe persistent asthma with (acute) exacerbation: Secondary | ICD-10-CM | POA: Diagnosis not present

## 2018-03-06 DIAGNOSIS — I10 Essential (primary) hypertension: Secondary | ICD-10-CM | POA: Diagnosis not present

## 2018-03-23 DIAGNOSIS — M65322 Trigger finger, left index finger: Secondary | ICD-10-CM | POA: Diagnosis not present

## 2018-03-23 DIAGNOSIS — M65341 Trigger finger, right ring finger: Secondary | ICD-10-CM | POA: Diagnosis not present

## 2018-03-23 DIAGNOSIS — M65321 Trigger finger, right index finger: Secondary | ICD-10-CM | POA: Diagnosis not present

## 2018-03-23 DIAGNOSIS — M65331 Trigger finger, right middle finger: Secondary | ICD-10-CM | POA: Diagnosis not present

## 2018-03-31 DIAGNOSIS — D721 Eosinophilia: Secondary | ICD-10-CM | POA: Diagnosis not present

## 2018-03-31 DIAGNOSIS — J4550 Severe persistent asthma, uncomplicated: Secondary | ICD-10-CM | POA: Diagnosis not present

## 2018-03-31 DIAGNOSIS — Z87891 Personal history of nicotine dependence: Secondary | ICD-10-CM | POA: Diagnosis not present

## 2018-04-07 ENCOUNTER — Other Ambulatory Visit (HOSPITAL_COMMUNITY): Payer: Self-pay | Admitting: Internal Medicine

## 2018-04-07 DIAGNOSIS — R7301 Impaired fasting glucose: Secondary | ICD-10-CM | POA: Diagnosis not present

## 2018-04-07 DIAGNOSIS — K219 Gastro-esophageal reflux disease without esophagitis: Secondary | ICD-10-CM | POA: Diagnosis not present

## 2018-04-07 DIAGNOSIS — Z1231 Encounter for screening mammogram for malignant neoplasm of breast: Secondary | ICD-10-CM

## 2018-04-07 DIAGNOSIS — N183 Chronic kidney disease, stage 3 (moderate): Secondary | ICD-10-CM | POA: Diagnosis not present

## 2018-04-07 DIAGNOSIS — M069 Rheumatoid arthritis, unspecified: Secondary | ICD-10-CM | POA: Diagnosis not present

## 2018-04-07 DIAGNOSIS — J441 Chronic obstructive pulmonary disease with (acute) exacerbation: Secondary | ICD-10-CM | POA: Diagnosis not present

## 2018-04-07 DIAGNOSIS — I10 Essential (primary) hypertension: Secondary | ICD-10-CM | POA: Diagnosis not present

## 2018-04-07 DIAGNOSIS — E1122 Type 2 diabetes mellitus with diabetic chronic kidney disease: Secondary | ICD-10-CM | POA: Diagnosis not present

## 2018-04-07 DIAGNOSIS — E782 Mixed hyperlipidemia: Secondary | ICD-10-CM | POA: Diagnosis not present

## 2018-04-07 DIAGNOSIS — J449 Chronic obstructive pulmonary disease, unspecified: Secondary | ICD-10-CM | POA: Diagnosis not present

## 2018-04-25 DIAGNOSIS — M65322 Trigger finger, left index finger: Secondary | ICD-10-CM | POA: Diagnosis not present

## 2018-04-25 DIAGNOSIS — M65331 Trigger finger, right middle finger: Secondary | ICD-10-CM | POA: Diagnosis not present

## 2018-05-04 ENCOUNTER — Ambulatory Visit (INDEPENDENT_AMBULATORY_CARE_PROVIDER_SITE_OTHER): Payer: PPO | Admitting: Otolaryngology

## 2018-05-04 DIAGNOSIS — K219 Gastro-esophageal reflux disease without esophagitis: Secondary | ICD-10-CM | POA: Diagnosis not present

## 2018-05-04 DIAGNOSIS — H903 Sensorineural hearing loss, bilateral: Secondary | ICD-10-CM | POA: Diagnosis not present

## 2018-05-04 DIAGNOSIS — R490 Dysphonia: Secondary | ICD-10-CM | POA: Diagnosis not present

## 2018-05-09 ENCOUNTER — Ambulatory Visit: Payer: PPO | Admitting: Internal Medicine

## 2018-05-09 ENCOUNTER — Encounter: Payer: Self-pay | Admitting: Internal Medicine

## 2018-05-09 VITALS — BP 123/80 | HR 106 | Temp 97.9°F | Ht 60.0 in | Wt 184.2 lb

## 2018-05-09 DIAGNOSIS — K219 Gastro-esophageal reflux disease without esophagitis: Secondary | ICD-10-CM

## 2018-05-09 DIAGNOSIS — R194 Change in bowel habit: Secondary | ICD-10-CM

## 2018-05-09 NOTE — Patient Instructions (Signed)
Begin Metamucil - take one packet every day  Return in 6 weeks for possible hemorrhoid Banding - depending on how you are doing.  Continue Nexium and Famotidine ordered by Dr. Melene Plan

## 2018-05-11 NOTE — Progress Notes (Signed)
Primary Care Physician:  Celene Squibb, MD Primary Gastroenterologist:  Dr. Gala Romney  Pre-Procedure History & Physical: HPI:  Glenda Terry is a 78 y.o. female here for evaluation of anorectal symptoms.  She feels she cannot get herself clean after having a bowel movement 1-2 bowel movements daily describes "soft serve" consistency.  No bleeding.  No fiber supplement.  Does not have to strain.  Goes daily.  She wonders about hemorrhoids.  Recent colonoscopy reviewed couple of adenomas removed-no significant hemorrhoids.  Reflux symptoms well controlled on esomeprazole 40 mg twice daily.  She denies dysphagia. Recently saw Dr. Lorelee Cover.  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  Evening H2 blocker therapy added to daily PPI.  No longer taking amities a, Linzess or MiraLAX.  Marland Kitchen    Past Medical History:  Diagnosis Date  . Anxiety   . Back pain, chronic   . CKD (chronic kidney disease) stage 3, GFR 30-59 ml/min (HCC)   . Compression fx, thoracic spine (HCC)    T - 11  . COPD (chronic obstructive pulmonary disease) ([place]) 12/16/2015  . 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 Essential hypertension   . GERD (gastroesophageal reflux disease)   . HOH (hard of hearing)   . Inflammatory arthritis   . Iron deficiency anemia 01/2012  . Pulmonary eosinophilia ([place])   . Spinal stenosis   . Tubular adenoma 11/2012    Past Surgical History:  Procedure Laterality Date  . ABDOMINAL HYSTERECTOMY    . BACK SURGERY    . CARPAL TUNNEL RELEASE Right 4/14  . CATARACT EXTRACTION W/PHACO Left 11/19/2014   Procedure: CATARACT EXTRACTION PHACO AND INTRAOCULAR LENS PLACEMENT (IOC);  Surgeon: Williams Che, MD;  Location: [facility];  Service: Ophthalmology;  Laterality: Left;  CDE: 4.77  . CATARACT EXTRACTION W/PHACO Right 02/03/2015   Procedure: CATARACT EXTRACTION PHACO AND INTRAOCULAR LENS PLACEMENT ([place]);  Surgeon: Williams Che, MD;  Location: [facility];  Service: Ophthalmology;  Laterality: Right;  CDE: 4.54  .  COLONOSCOPY  06/19/2008   RMR: tortuous and elongated colon with scattered left-sided diverticula/colonic mucosa appeared entirely normal. Prior colonic ulcers had resolved.  . COLONOSCOPY  12/2007   Dr. Gala Romney: Scattered diffuse sigmoid diverticula, 2 areas of ulceration at the hepatic flexure. Biopsies unremarkable.  . COLONOSCOPY N/A 11/20/2012   next TCS 11/2017  . COLONOSCOPY WITH PROPOFOL N/A 12/22/2017   Procedure: COLONOSCOPY WITH PROPOFOL;  Surgeon: Daneil Dolin, MD;  [facility];  Service: Endoscopy;  Laterality: N/A;  7:30am  . DECOMPRESSIVE LUMBAR LAMINECTOMY LEVEL 2  04/20/2012   Procedure: DECOMPRESSIVE LUMBAR LAMINECTOMY LEVEL 2;  Surgeon: Tobi Bastos, MD;  Location: [facility];  Service: Orthopedics;  Laterality: Right;  Decompressive Lumbar Laminectomy of the L4 - L5 and L5 - S1 Complete/Laminectomy L5 on the Right (X-Ray)  . ESOPHAGOGASTRODUODENOSCOPY  12/2007   Dr. 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 Small hiatal hernia. Slightly pale duodenal mucosa (biopsy unremarkable)  . ESOPHAGOGASTRODUODENOSCOPY (EGD) WITH PROPOFOL N/A 01/20/2017   Dr. Gala Romney: Medium sized hiatal hernia empiric esophageal dilation for history of dysphagia  . EYE SURGERY     BIL CATARACTS  . IR KYPHO THORACIC WITH BONE BIOPSY  02/08/2017  . IR RADIOLOGIST EVAL & MGMT  02/02/2017  . JOINT REPLACEMENT    . KNEE ARTHROSCOPY Right   . MALONEY DILATION N/A 01/20/2017   Procedure: Venia Minks DILATION;  Surgeon: Daneil Dolin, MD;  [facility];  Service: Endoscopy;  Laterality: N/A;  . POLYPECTOMY  12/22/2017   Procedure: POLYPECTOMY;  Surgeon: Daneil Dolin, MD;  [facility];  Service: Endoscopy;;  . REVERSE SHOULDER ARTHROPLASTY Right 05/24/2014   Procedure: RIGHT SHOULDER REVERSE ARTHROPLASTY;  Surgeon: Netta Cedars, MD;  Location: [facility];  Service: Orthopedics;  Laterality: Right;  . REVERSE SHOULDER ARTHROPLASTY Left  06/10/2017   Procedure: LEFT REVERSE SHOULDER ARTHROPLASTY;  Surgeon: Netta Cedars, MD;  Location: [facility];  Service: Orthopedics;  Laterality: Left;  . TOTAL KNEE ARTHROPLASTY  01/24/2012   Procedure: TOTAL KNEE ARTHROPLASTY;  Surgeon: Gearlean Alf, MD;  Location: [facility];  Service: Orthopedics;  Laterality: Right;  . TUBAL LIGATION      Prior to Admission medications   Medication Sig Start Date End Date Taking? Authorizing Provider  acetaminophen (TYLENOL 8 HOUR ARTHRITIS PAIN) 650 MG CR tablet Take 1,300 mg by mouth at bedtime.    Yes [provider]  albuterol (PROAIR HFA) 108 (90 BASE) MCG/ACT inhaler Inhale 2 puffs into the lungs every 6 (six) hours as needed for wheezing or shortness of breath. For COPD   Yes [provider]  ALPRAZolam (XANAX) 0.5 MG tablet Take 0.25-0.5 mg by mouth 2 (two) times daily as needed for anxiety.    Yes [provider]  Benralizumab (FASENRA) 30 MG/ML SOSY Inject 30 mg into the skin every 8 (eight) weeks.    Yes [provider]  denosumab (PROLIA) 60 MG/ML SOSY injection Inject 60 mg into the skin every 6 (six) months. 03/01/18  Yes [provider]  docusate sodium (COLACE) 100 MG capsule Take 100 mg by mouth daily as needed for mild constipation.   Yes [provider]  EPINEPHrine (EPIPEN 2-PAK) 0.3 mg/0.3 mL IJ SOAJ injection Inject 0.3 mg into the muscle once.    Yes [provider]  esomeprazole (NEXIUM) 40 MG capsule Take 1 capsule (40 mg total) by mouth 2 (two) times daily. Patient taking differently: Take 40 mg by mouth daily.  01/09/18  Yes Annitta Needs, NP  famotidine (PEPCID) 10 MG tablet Take 10 mg by mouth at bedtime.   Yes [provider]  ferrous sulfate 325 (65 FE) MG tablet Take 325 mg by mouth daily with breakfast.   Yes [provider]  fexofenadine (ALLEGRA) 180 MG tablet Take 180 mg by mouth daily.   Yes [provider]  hydrocortisone (ANUSOL-HC)  2.5 % rectal cream Place 1 application rectally 3 (three) times daily. Patient taking differently: Place 1 application rectally 3 (three) times daily as needed for hemorrhoids or anal itching.  04/18/17  Yes Antanisha Mohs, Cristopher Estimable, MD  ipratropium-albuterol (DUONEB) 0.5-2.5 (3) MG/3ML SOLN Take 3 mLs by nebulization 2 (two) times daily.    Yes [provider]  ketotifen (ZADITOR) 0.025 % ophthalmic solution Place 1 drop into both eyes 2 (two) times daily.   Yes [provider]  linaclotide Rolan Lipa) 290 MCG CAPS capsule Take 1 capsule (290 mcg total) by mouth daily before breakfast. 12/13/17  Yes Mahala Menghini, PA-C  Magnesium 250 MG TABS Take 250 mg by mouth daily.    Yes [provider]  mometasone-formoterol (DULERA) 100-5 MCG/ACT AERO Inhale 2 puffs into the lungs 2 (two) times daily.   Yes [provider]  montelukast (SINGULAIR) 10 MG tablet Take 10 mg by mouth daily.  04/12/15  Yes [provider]  olmesartan (BENICAR) 40 MG tablet Take 40 mg by mouth daily.  Yes [provider]  Polyethyl Glycol-Propyl Glycol (SYSTANE OP) Place 1 drop into both eyes 2 (two) times daily.   Yes [provider]  polyethylene glycol (MIRALAX / GLYCOLAX) packet Take 17 g by mouth daily as needed.   Yes [provider]  potassium chloride (K-DUR) 10 MEQ tablet Take 10 mEq by mouth every other day.  11/16/16  Yes [provider]  pravastatin (PRAVACHOL) 10 MG tablet Take 1 tablet by mouth daily. 04/24/18  Yes [provider]  torsemide (DEMADEX) 10 MG tablet Take 10 mg by mouth every other day.  11/12/16  Yes [provider]  fluticasone (FLONASE) 50 MCG/ACT nasal spray Place 1 spray into both nostrils 2 (two) times daily.    [provider]  HYDROcodone-acetaminophen (NORCO) 5-325 MG tablet Take 1-2 tablets by mouth every 6 (six) hours as needed for moderate pain or severe pain. Patient not taking: Reported on 05/09/2018  06/20/17   Wille Celeste, PA-C  lubiprostone (AMITIZA) 8 MCG capsule Take one capsule by mouth with a meal one to two times daily for constipation. Patient not taking: Reported on 05/09/2018 11/28/17   Mahala Menghini, PA-C  polyethylene glycol-electrolytes (TRILYTE) 420 g solution Take 4,000 mLs by mouth as directed. Patient not taking: Reported on 05/09/2018 11/28/17   Daneil Dolin, MD  Turmeric 500 MG CAPS Take 500 mg by mouth daily.     [provider]    Allergies as of 05/09/2018 - Review Complete 05/09/2018  Allergen Reaction Noted  . Flexeril [cyclobenzaprine] Anaphylaxis 01/12/2012  . Other Anaphylaxis and Other (See Comments) 02/08/2011  . Keflex [cephalexin] Itching 05/26/2012  . Aspirin Other (See Comments) 05/05/2015  . Statins Swelling and Other (See Comments) 05/17/2011  . Adhesive [tape] Rash 11/01/2014  . Chlorhexidine Rash 11/19/2014    Family History  Problem Relation Age of Onset  . Breast cancer Sister   . Colon cancer Sister        two deceased, one living and terminal, one current undergoing treatment, ages 32, 93, 44, 86  . Hypertension Mother   . Stroke Mother   . Heart attack Mother   . Hypertension Father   . Prostate cancer Father     Social History   Socioeconomic History  . Marital status: Single    Spouse name: Not on file  . Number of children: 3  . Years of education: Not on file  . Highest education level: Not on file  Occupational History  . Not on file  Social Needs  . Financial resource strain: Not on file  . Food insecurity:    Worry: Not on file    Inability: Not on file  . Transportation needs:    Medical: Not on file    Non-medical: Not on file  Tobacco Use  . Smoking status: Former Smoker    Packs/day: 2.00    Years: 40.00    Pack years: 80.00    Types: Cigarettes    Last attempt to quit: 01/19/1992    Years since quitting: 26.3  . Smokeless tobacco: Never Used  Substance and Sexual Activity  . Alcohol use: No    . Drug use: No  . Sexual activity: Never  Lifestyle  . Physical activity:    Days per week: Not on file    Minutes per session: Not on file  . Stress: Not on file  Relationships  . Social connections:    Talks on phone: Not on file  Gets together: Not on file    Attends religious service: Not on file    Active member of club or organization: Not on file    Attends meetings of clubs or organizations: Not on file    Relationship status: Not on file  . Intimate partner violence:    Fear of current or ex partner: Not on file    Emotionally abused: Not on file    Physically abused: Not on file    Forced sexual activity: Not on file  Other Topics Concern  . Not on file  Social History Narrative  . Not on file    Review of Systems: See HPI, otherwise negative ROS  Physical Exam: BP 123/80   Pulse (!) 106   Temp 97.9 F (36.6 C) (Oral)   Ht 5' (1.524 m)   Wt 184 lb 3.2 oz (83.6 kg)   BMI 35.97 kg/m  General:   Alert, pleasant and cooperative in NAD Neck:  Supple; no masses or thyromegaly. No significant cervical adenopathy. Heart:  Regular rate and rhythm; no murmurs, clicks, rubs,  or gallops. Abdomen: Non-distended, normal bowel sounds.  Soft and nontender without appreciable mass or hepatosplenomegaly.  Pulses:  Normal pulses noted. Extremities:  Without clubbing or edema. Rectal: No external lesions.  Moderate sphincter tone.  Scant brown stool no masses.  Stool Hemoccult negative.  I did not identify any significant hemorrhoid tissue.   Impression/Plan: Pleasant 78 year old lady with altered bowel function.  Likely has somewhat loose anal sphincteric mechanism.  She has significant hemorrhoids though she may have some lax tissues at the anorectum.  Colonoscopy reassuring.  Suspect she may benefit from bolstering her fiber intake alone.  At this time, there is really no incontinence.  GERD/probable LPR on high-dose PPI therapy and nightly H2 blocker  therapy   Recommendations:  Begin Metamucil - take one packet every day  Return in 6 weeks for possible hemorrhoid Banding - depending on how you are doing.  Continue Nexium and Famotidine ordered by Dr. Melene Plan       Notice: This dictation was prepared with Dragon dictation along with smaller phrase technology. Any transcriptional errors that result from this process are unintentional and may not be corrected upon review.

## 2018-05-15 ENCOUNTER — Ambulatory Visit (HOSPITAL_COMMUNITY)
Admission: RE | Admit: 2018-05-15 | Discharge: 2018-05-15 | Disposition: A | Discharge status: Home / Self Care | Payer: PPO | Source: Ambulatory Visit | Attending: Internal Medicine | Admitting: Internal Medicine

## 2018-05-15 DIAGNOSIS — Z1231 Encounter for screening mammogram for malignant neoplasm of breast: Secondary | ICD-10-CM | POA: Diagnosis not present

## 2018-05-19 DIAGNOSIS — I10 Essential (primary) hypertension: Secondary | ICD-10-CM | POA: Diagnosis not present

## 2018-05-19 DIAGNOSIS — D509 Iron deficiency anemia, unspecified: Secondary | ICD-10-CM | POA: Diagnosis not present

## 2018-05-19 DIAGNOSIS — E1122 Type 2 diabetes mellitus with diabetic chronic kidney disease: Secondary | ICD-10-CM | POA: Diagnosis not present

## 2018-05-19 DIAGNOSIS — E782 Mixed hyperlipidemia: Secondary | ICD-10-CM | POA: Diagnosis not present

## 2018-05-24 DIAGNOSIS — R7301 Impaired fasting glucose: Secondary | ICD-10-CM | POA: Diagnosis not present

## 2018-05-24 DIAGNOSIS — F411 Generalized anxiety disorder: Secondary | ICD-10-CM | POA: Diagnosis not present

## 2018-05-24 DIAGNOSIS — J449 Chronic obstructive pulmonary disease, unspecified: Secondary | ICD-10-CM | POA: Diagnosis not present

## 2018-05-24 DIAGNOSIS — N183 Chronic kidney disease, stage 3 (moderate): Secondary | ICD-10-CM | POA: Diagnosis not present

## 2018-05-24 DIAGNOSIS — K219 Gastro-esophageal reflux disease without esophagitis: Secondary | ICD-10-CM | POA: Diagnosis not present

## 2018-05-24 DIAGNOSIS — J45909 Unspecified asthma, uncomplicated: Secondary | ICD-10-CM | POA: Diagnosis not present

## 2018-05-24 DIAGNOSIS — D509 Iron deficiency anemia, unspecified: Secondary | ICD-10-CM | POA: Diagnosis not present

## 2018-05-24 DIAGNOSIS — E782 Mixed hyperlipidemia: Secondary | ICD-10-CM | POA: Diagnosis not present

## 2018-05-24 DIAGNOSIS — M069 Rheumatoid arthritis, unspecified: Secondary | ICD-10-CM | POA: Diagnosis not present

## 2018-05-24 DIAGNOSIS — I10 Essential (primary) hypertension: Secondary | ICD-10-CM | POA: Diagnosis not present

## 2018-05-24 DIAGNOSIS — E1122 Type 2 diabetes mellitus with diabetic chronic kidney disease: Secondary | ICD-10-CM | POA: Diagnosis not present

## 2018-05-25 DIAGNOSIS — M65322 Trigger finger, left index finger: Secondary | ICD-10-CM | POA: Diagnosis not present

## 2018-05-25 DIAGNOSIS — M65331 Trigger finger, right middle finger: Secondary | ICD-10-CM | POA: Diagnosis not present

## 2018-05-26 DIAGNOSIS — Z87891 Personal history of nicotine dependence: Secondary | ICD-10-CM | POA: Diagnosis not present

## 2018-05-26 DIAGNOSIS — D721 Eosinophilia: Secondary | ICD-10-CM | POA: Diagnosis not present

## 2018-05-26 DIAGNOSIS — J4550 Severe persistent asthma, uncomplicated: Secondary | ICD-10-CM | POA: Diagnosis not present

## 2018-05-26 DIAGNOSIS — Z7951 Long term (current) use of inhaled steroids: Secondary | ICD-10-CM | POA: Diagnosis not present

## 2018-05-26 DIAGNOSIS — Z79899 Other long term (current) drug therapy: Secondary | ICD-10-CM | POA: Diagnosis not present

## 2018-06-26 DIAGNOSIS — J3089 Other allergic rhinitis: Secondary | ICD-10-CM | POA: Diagnosis not present

## 2018-06-26 DIAGNOSIS — J4541 Moderate persistent asthma with (acute) exacerbation: Secondary | ICD-10-CM | POA: Diagnosis not present

## 2018-06-26 DIAGNOSIS — Z8546 Personal history of malignant neoplasm of prostate: Secondary | ICD-10-CM | POA: Diagnosis not present

## 2018-06-26 DIAGNOSIS — J441 Chronic obstructive pulmonary disease with (acute) exacerbation: Secondary | ICD-10-CM | POA: Diagnosis not present

## 2018-07-04 ENCOUNTER — Ambulatory Visit: Payer: PPO | Admitting: Internal Medicine

## 2018-07-19 DIAGNOSIS — Z0000 Encounter for general adult medical examination without abnormal findings: Secondary | ICD-10-CM | POA: Diagnosis not present

## 2018-07-19 DIAGNOSIS — Z736 Limitation of activities due to disability: Secondary | ICD-10-CM | POA: Diagnosis not present

## 2018-07-19 DIAGNOSIS — J4550 Severe persistent asthma, uncomplicated: Secondary | ICD-10-CM | POA: Diagnosis not present

## 2018-07-19 DIAGNOSIS — Z7409 Other reduced mobility: Secondary | ICD-10-CM | POA: Diagnosis not present

## 2018-07-21 DIAGNOSIS — D721 Eosinophilia: Secondary | ICD-10-CM | POA: Diagnosis not present

## 2018-07-21 DIAGNOSIS — Z87891 Personal history of nicotine dependence: Secondary | ICD-10-CM | POA: Diagnosis not present

## 2018-07-21 DIAGNOSIS — J4550 Severe persistent asthma, uncomplicated: Secondary | ICD-10-CM | POA: Diagnosis not present

## 2018-07-25 DIAGNOSIS — G894 Chronic pain syndrome: Secondary | ICD-10-CM | POA: Diagnosis not present

## 2018-07-25 DIAGNOSIS — M545 Low back pain: Secondary | ICD-10-CM | POA: Diagnosis not present

## 2018-08-04 DIAGNOSIS — I10 Essential (primary) hypertension: Secondary | ICD-10-CM | POA: Diagnosis not present

## 2018-08-04 DIAGNOSIS — N183 Chronic kidney disease, stage 3 (moderate): Secondary | ICD-10-CM | POA: Diagnosis not present

## 2018-08-04 DIAGNOSIS — E782 Mixed hyperlipidemia: Secondary | ICD-10-CM | POA: Diagnosis not present

## 2018-08-04 DIAGNOSIS — J441 Chronic obstructive pulmonary disease with (acute) exacerbation: Secondary | ICD-10-CM | POA: Diagnosis not present

## 2018-08-14 DIAGNOSIS — J449 Chronic obstructive pulmonary disease, unspecified: Secondary | ICD-10-CM | POA: Diagnosis not present

## 2018-08-14 DIAGNOSIS — J441 Chronic obstructive pulmonary disease with (acute) exacerbation: Secondary | ICD-10-CM | POA: Diagnosis not present

## 2018-08-14 DIAGNOSIS — E782 Mixed hyperlipidemia: Secondary | ICD-10-CM | POA: Diagnosis not present

## 2018-08-14 DIAGNOSIS — I10 Essential (primary) hypertension: Secondary | ICD-10-CM | POA: Diagnosis not present

## 2018-08-14 DIAGNOSIS — K219 Gastro-esophageal reflux disease without esophagitis: Secondary | ICD-10-CM | POA: Diagnosis not present

## 2018-08-14 DIAGNOSIS — N183 Chronic kidney disease, stage 3 (moderate): Secondary | ICD-10-CM | POA: Diagnosis not present

## 2018-08-14 DIAGNOSIS — M069 Rheumatoid arthritis, unspecified: Secondary | ICD-10-CM | POA: Diagnosis not present

## 2018-08-14 DIAGNOSIS — E1122 Type 2 diabetes mellitus with diabetic chronic kidney disease: Secondary | ICD-10-CM | POA: Diagnosis not present

## 2018-08-14 DIAGNOSIS — R7301 Impaired fasting glucose: Secondary | ICD-10-CM | POA: Diagnosis not present

## 2018-08-22 ENCOUNTER — Other Ambulatory Visit: Payer: Self-pay | Admitting: Gastroenterology

## 2018-08-31 ENCOUNTER — Other Ambulatory Visit: Payer: Self-pay | Admitting: Gastroenterology

## 2018-08-31 ENCOUNTER — Encounter (HOSPITAL_COMMUNITY)
Admission: RE | Admit: 2018-08-31 | Discharge: 2018-08-31 | Disposition: A | Discharge status: Home / Self Care | Payer: PPO | Source: Ambulatory Visit | Attending: Internal Medicine | Admitting: Internal Medicine

## 2018-08-31 ENCOUNTER — Other Ambulatory Visit: Payer: Self-pay

## 2018-08-31 DIAGNOSIS — M810 Age-related osteoporosis without current pathological fracture: Secondary | ICD-10-CM | POA: Diagnosis not present

## 2018-08-31 MED ORDER — DENOSUMAB 60 MG/ML ~~LOC~~ SOSY
60.0000 mg | PREFILLED_SYRINGE | Freq: Once | SUBCUTANEOUS | Status: AC
  Administered 2018-08-31: 60 mg via SUBCUTANEOUS

## 2018-09-01 DIAGNOSIS — R7301 Impaired fasting glucose: Secondary | ICD-10-CM | POA: Diagnosis not present

## 2018-09-01 DIAGNOSIS — D509 Iron deficiency anemia, unspecified: Secondary | ICD-10-CM | POA: Diagnosis not present

## 2018-09-01 DIAGNOSIS — E782 Mixed hyperlipidemia: Secondary | ICD-10-CM | POA: Diagnosis not present

## 2018-09-01 DIAGNOSIS — E1122 Type 2 diabetes mellitus with diabetic chronic kidney disease: Secondary | ICD-10-CM | POA: Diagnosis not present

## 2018-09-01 DIAGNOSIS — N183 Chronic kidney disease, stage 3 (moderate): Secondary | ICD-10-CM | POA: Diagnosis not present

## 2018-09-01 DIAGNOSIS — I10 Essential (primary) hypertension: Secondary | ICD-10-CM | POA: Diagnosis not present

## 2018-09-05 DIAGNOSIS — M1990 Unspecified osteoarthritis, unspecified site: Secondary | ICD-10-CM | POA: Diagnosis not present

## 2018-09-05 DIAGNOSIS — M8580 Other specified disorders of bone density and structure, unspecified site: Secondary | ICD-10-CM | POA: Diagnosis not present

## 2018-09-05 DIAGNOSIS — N183 Chronic kidney disease, stage 3 (moderate): Secondary | ICD-10-CM | POA: Diagnosis not present

## 2018-09-05 DIAGNOSIS — I10 Essential (primary) hypertension: Secondary | ICD-10-CM | POA: Diagnosis not present

## 2018-09-05 DIAGNOSIS — J82 Pulmonary eosinophilia, not elsewhere classified: Secondary | ICD-10-CM | POA: Diagnosis not present

## 2018-09-05 DIAGNOSIS — J441 Chronic obstructive pulmonary disease with (acute) exacerbation: Secondary | ICD-10-CM | POA: Diagnosis not present

## 2018-09-05 DIAGNOSIS — F411 Generalized anxiety disorder: Secondary | ICD-10-CM | POA: Diagnosis not present

## 2018-09-05 DIAGNOSIS — E782 Mixed hyperlipidemia: Secondary | ICD-10-CM | POA: Diagnosis not present

## 2018-09-05 DIAGNOSIS — D509 Iron deficiency anemia, unspecified: Secondary | ICD-10-CM | POA: Diagnosis not present

## 2018-09-05 DIAGNOSIS — K219 Gastro-esophageal reflux disease without esophagitis: Secondary | ICD-10-CM | POA: Diagnosis not present

## 2018-09-05 DIAGNOSIS — E1122 Type 2 diabetes mellitus with diabetic chronic kidney disease: Secondary | ICD-10-CM | POA: Diagnosis not present

## 2018-09-14 DIAGNOSIS — J4550 Severe persistent asthma, uncomplicated: Secondary | ICD-10-CM | POA: Diagnosis not present

## 2018-09-14 DIAGNOSIS — D721 Eosinophilia: Secondary | ICD-10-CM | POA: Diagnosis not present

## 2018-09-14 DIAGNOSIS — Z87891 Personal history of nicotine dependence: Secondary | ICD-10-CM | POA: Diagnosis not present

## 2018-09-25 DIAGNOSIS — M1990 Unspecified osteoarthritis, unspecified site: Secondary | ICD-10-CM | POA: Diagnosis not present

## 2018-09-25 DIAGNOSIS — J82 Pulmonary eosinophilia, not elsewhere classified: Secondary | ICD-10-CM | POA: Diagnosis not present

## 2018-09-25 DIAGNOSIS — J441 Chronic obstructive pulmonary disease with (acute) exacerbation: Secondary | ICD-10-CM | POA: Diagnosis not present

## 2018-09-25 DIAGNOSIS — E1122 Type 2 diabetes mellitus with diabetic chronic kidney disease: Secondary | ICD-10-CM | POA: Diagnosis not present

## 2018-09-25 DIAGNOSIS — E782 Mixed hyperlipidemia: Secondary | ICD-10-CM | POA: Diagnosis not present

## 2018-09-25 DIAGNOSIS — D509 Iron deficiency anemia, unspecified: Secondary | ICD-10-CM | POA: Diagnosis not present

## 2018-09-25 DIAGNOSIS — K219 Gastro-esophageal reflux disease without esophagitis: Secondary | ICD-10-CM | POA: Diagnosis not present

## 2018-09-25 DIAGNOSIS — F411 Generalized anxiety disorder: Secondary | ICD-10-CM | POA: Diagnosis not present

## 2018-09-25 DIAGNOSIS — I10 Essential (primary) hypertension: Secondary | ICD-10-CM | POA: Diagnosis not present

## 2018-09-25 DIAGNOSIS — M8580 Other specified disorders of bone density and structure, unspecified site: Secondary | ICD-10-CM | POA: Diagnosis not present

## 2018-09-25 DIAGNOSIS — N183 Chronic kidney disease, stage 3 (moderate): Secondary | ICD-10-CM | POA: Diagnosis not present

## 2018-10-04 DIAGNOSIS — M961 Postlaminectomy syndrome, not elsewhere classified: Secondary | ICD-10-CM | POA: Diagnosis not present

## 2018-10-04 DIAGNOSIS — M5136 Other intervertebral disc degeneration, lumbar region: Secondary | ICD-10-CM | POA: Diagnosis not present

## 2018-10-04 DIAGNOSIS — M48061 Spinal stenosis, lumbar region without neurogenic claudication: Secondary | ICD-10-CM | POA: Diagnosis not present

## 2018-10-11 ENCOUNTER — Emergency Department (HOSPITAL_COMMUNITY)
Admission: EM | Admit: 2018-10-11 | Discharge: 2018-10-11 | Disposition: A | Discharge status: Home / Self Care | Payer: PPO | Attending: Emergency Medicine | Admitting: Emergency Medicine

## 2018-10-11 ENCOUNTER — Other Ambulatory Visit: Payer: Self-pay

## 2018-10-11 ENCOUNTER — Emergency Department (HOSPITAL_COMMUNITY): Payer: PPO

## 2018-10-11 ENCOUNTER — Encounter (HOSPITAL_COMMUNITY): Payer: Self-pay | Admitting: *Deleted

## 2018-10-11 DIAGNOSIS — T380X5A Adverse effect of glucocorticoids and synthetic analogues, initial encounter: Secondary | ICD-10-CM | POA: Diagnosis not present

## 2018-10-11 DIAGNOSIS — Z79899 Other long term (current) drug therapy: Secondary | ICD-10-CM | POA: Diagnosis not present

## 2018-10-11 DIAGNOSIS — I129 Hypertensive chronic kidney disease with stage 1 through stage 4 chronic kidney disease, or unspecified chronic kidney disease: Secondary | ICD-10-CM | POA: Diagnosis not present

## 2018-10-11 DIAGNOSIS — Z87891 Personal history of nicotine dependence: Secondary | ICD-10-CM | POA: Diagnosis not present

## 2018-10-11 DIAGNOSIS — T50905A Adverse effect of unspecified drugs, medicaments and biological substances, initial encounter: Secondary | ICD-10-CM

## 2018-10-11 DIAGNOSIS — T363X5A Adverse effect of macrolides, initial encounter: Secondary | ICD-10-CM | POA: Diagnosis not present

## 2018-10-11 DIAGNOSIS — N183 Chronic kidney disease, stage 3 (moderate): Secondary | ICD-10-CM | POA: Insufficient documentation

## 2018-10-11 DIAGNOSIS — Z20828 Contact with and (suspected) exposure to other viral communicable diseases: Secondary | ICD-10-CM | POA: Diagnosis not present

## 2018-10-11 DIAGNOSIS — R0602 Shortness of breath: Secondary | ICD-10-CM | POA: Diagnosis not present

## 2018-10-11 DIAGNOSIS — J449 Chronic obstructive pulmonary disease, unspecified: Secondary | ICD-10-CM | POA: Insufficient documentation

## 2018-10-11 LAB — URINALYSIS, ROUTINE W REFLEX MICROSCOPIC
Bilirubin Urine: NEGATIVE
Glucose, UA: NEGATIVE mg/dL
Hgb urine dipstick: NEGATIVE
Ketones, ur: NEGATIVE mg/dL
Leukocytes,Ua: NEGATIVE
Nitrite: NEGATIVE
Protein, ur: NEGATIVE mg/dL
Specific Gravity, Urine: 1.005 (ref 1.005–1.030)
pH: 6 (ref 5.0–8.0)

## 2018-10-11 LAB — CBC WITH DIFFERENTIAL/PLATELET
Abs Immature Granulocytes: 0.07 10*3/uL (ref 0.00–0.07)
Basophils Absolute: 0 10*3/uL (ref 0.0–0.1)
Basophils Relative: 0 %
Eosinophils Absolute: 0 10*3/uL (ref 0.0–0.5)
Eosinophils Relative: 0 %
HCT: 37.7 % (ref 36.0–46.0)
Hemoglobin: 11.4 g/dL — ABNORMAL LOW (ref 12.0–15.0)
Immature Granulocytes: 1 %
Lymphocytes Relative: 17 %
Lymphs Abs: 2 10*3/uL (ref 0.7–4.0)
MCH: 24.7 pg — ABNORMAL LOW (ref 26.0–34.0)
MCHC: 30.2 g/dL (ref 30.0–36.0)
MCV: 81.6 fL (ref 80.0–100.0)
Monocytes Absolute: 0.4 10*3/uL (ref 0.1–1.0)
Monocytes Relative: 3 %
Neutro Abs: 9.5 10*3/uL — ABNORMAL HIGH (ref 1.7–7.7)
Neutrophils Relative %: 79 %
Platelets: 341 10*3/uL (ref 150–400)
RBC: 4.62 MIL/uL (ref 3.87–5.11)
RDW: 14.3 % (ref 11.5–15.5)
WBC: 12 10*3/uL — ABNORMAL HIGH (ref 4.0–10.5)
nRBC: 0.9 % — ABNORMAL HIGH (ref 0.0–0.2)

## 2018-10-11 LAB — BASIC METABOLIC PANEL WITH GFR
Anion gap: 8 (ref 5–15)
BUN: 30 mg/dL — ABNORMAL HIGH (ref 8–23)
CO2: 24 mmol/L (ref 22–32)
Calcium: 9.4 mg/dL (ref 8.9–10.3)
Chloride: 99 mmol/L (ref 98–111)
Creatinine, Ser: 1.73 mg/dL — ABNORMAL HIGH (ref 0.44–1.00)
GFR calc Af Amer: 33 mL/min — ABNORMAL LOW
GFR calc non Af Amer: 28 mL/min — ABNORMAL LOW
Glucose, Bld: 115 mg/dL — ABNORMAL HIGH (ref 70–99)
Potassium: 4.6 mmol/L (ref 3.5–5.1)
Sodium: 131 mmol/L — ABNORMAL LOW (ref 135–145)

## 2018-10-11 LAB — SARS CORONAVIRUS 2 BY RT PCR (HOSPITAL ORDER, PERFORMED IN ~~LOC~~ HOSPITAL LAB): SARS Coronavirus 2: NEGATIVE

## 2018-10-11 MED ORDER — ALPRAZOLAM 0.5 MG PO TABS
0.5000 mg | ORAL_TABLET | Freq: Once | ORAL | Status: AC
  Administered 2018-10-11: 0.5 mg via ORAL
  Filled 2018-10-11: qty 1

## 2018-10-11 MED ORDER — ALBUTEROL SULFATE HFA 108 (90 BASE) MCG/ACT IN AERS
8.0000 | INHALATION_SPRAY | Freq: Once | RESPIRATORY_TRACT | Status: DC
  Filled 2018-10-11: qty 6.7

## 2018-10-11 MED ORDER — ACETAMINOPHEN 325 MG PO TABS
650.0000 mg | ORAL_TABLET | Freq: Once | ORAL | Status: AC
  Administered 2018-10-11: 650 mg via ORAL
  Filled 2018-10-11: qty 2

## 2018-10-11 MED ORDER — SODIUM CHLORIDE 0.9 % IV BOLUS
1000.0000 mL | Freq: Once | INTRAVENOUS | Status: AC
  Administered 2018-10-11: 1000 mL via INTRAVENOUS

## 2018-10-11 MED ORDER — IPRATROPIUM BROMIDE HFA 17 MCG/ACT IN AERS
2.0000 | INHALATION_SPRAY | Freq: Once | RESPIRATORY_TRACT | Status: DC
  Filled 2018-10-11: qty 12.9

## 2018-10-11 NOTE — ED Notes (Signed)
Pt's daughter states pt recently started on Prednisone

## 2018-10-11 NOTE — ED Provider Notes (Signed)
[facility] EMERGENCY DEPARTMENT Provider Note   CSN: [id number] Arrival date & time: 10/11/18  1457    History   Chief Complaint Chief Complaint  Patient presents with  . Shortness of Breath    HPI Glenda Terry is a 78 y.o. female with history of COPD, hypertension, CKD stage III who presents with shortness of breath since last week.  Patient has been taking prednisone and azithromycin as well as her inhalers, Dulera and DuoNeb) as prescribed.  She reports continued shortness of breath as well as anxiety and feeling combative.  She has had associated cough.  She denies any fever or chest pain.  She had a telephone visit with her pulmonologist and she was sent here for further evaluation.  Patient denies any abdominal pain, nausea, vomiting.     HPI  Past Medical History:  Diagnosis Date  . Anxiety   . Back pain, chronic   . CKD (chronic kidney disease) stage 3, GFR 30-59 ml/min (HCC)   . Compression fx, thoracic spine (HCC)    T - 11  . COPD (chronic obstructive pulmonary disease) ([place]) 12/16/2015  . 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 Essential hypertension   . GERD (gastroesophageal reflux disease)   . HOH (hard of hearing)   . Inflammatory arthritis   . Iron deficiency anemia 01/2012  . 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 Spinal stenosis   . Tubular adenoma 11/2012    Patient Active Problem List   Diagnosis Date Noted  . Constipation 11/28/2017  . Hx of adenomatous colonic polyps 11/28/2017  . 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 Eosinophil count raised 06/09/2016  . Severe persistent asthma 06/07/2016  . COPD (chronic obstructive pulmonary disease) ([place]) 12/16/2015  . Asthma exacerbation 09/20/2015  . Acute respiratory failure ([place]) 09/20/2015  . CKD (chronic kidney disease) stage 3, GFR 30-59 ml/min (HCC) 09/20/2015  . Hemorrhoids, internal 08/21/2015  . 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 Microcytic anemia 11/01/2012  . Family hx of colon cancer  11/01/2012  . 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 Herniated lumbar intervertebral disc 04/20/2012  . Difficulty in walking(719.7) 03/13/2012  . Stiffness of joint, not elsewhere classified, lower leg 03/13/2012  . Weakness of right leg 03/13/2012  . OA (osteoarthritis) of knee 01/24/2012  . Anxiety 02/09/2011  . HTN (hypertension) 02/09/2011  . Arthritis 02/09/2011  . GERD (gastroesophageal reflux disease) 02/09/2011  . Rheumatoid arthritis ([place]) 02/09/2011    Past Surgical History:  Procedure Laterality Date  . ABDOMINAL HYSTERECTOMY    . BACK SURGERY    . CARPAL TUNNEL RELEASE Right 4/14  . CATARACT EXTRACTION W/PHACO Left 11/19/2014   Procedure: CATARACT EXTRACTION PHACO AND INTRAOCULAR LENS PLACEMENT (IOC);  Surgeon: Williams Che, MD;  Location: [facility];  Service: Ophthalmology;  Laterality: Left;  CDE: 4.77  . CATARACT EXTRACTION W/PHACO Right 02/03/2015   Procedure: CATARACT EXTRACTION PHACO AND INTRAOCULAR LENS PLACEMENT ([place]);  Surgeon: Williams Che, MD;  Location: [facility];  Service: Ophthalmology;  Laterality: Right;  CDE: 4.54  . COLONOSCOPY  06/19/2008   RMR: tortuous and elongated colon with scattered left-sided diverticula/colonic mucosa appeared entirely normal. Prior colonic ulcers had resolved.  . COLONOSCOPY  12/2007   Dr. Gala Romney: Scattered diffuse sigmoid diverticula, 2 areas of ulceration at the hepatic flexure. Biopsies unremarkable.  . COLONOSCOPY N/A 11/20/2012   next TCS 11/2017  . COLONOSCOPY WITH PROPOFOL N/A 12/22/2017   Procedure: COLONOSCOPY WITH PROPOFOL;  Surgeon: Manus Rudd  M, MD;  Location: [facility];  Service: Endoscopy;  Laterality: N/A;  7:30am  . DECOMPRESSIVE LUMBAR LAMINECTOMY LEVEL 2  04/20/2012   Procedure: DECOMPRESSIVE LUMBAR LAMINECTOMY LEVEL 2;  Surgeon: Tobi Bastos, MD;  Location: [facility] OR[facility];  Service: Orthopedics;  Laterality: Right;  Decompressive Lumbar Laminectomy of the L4 - L5 and L5 -  S1 Complete/Laminectomy L5 on the Right (X-Ray)  . ESOPHAGOGASTRODUODENOSCOPY  12/2007   Dr. 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 Small hiatal hernia. Slightly pale duodenal mucosa (biopsy unremarkable)  . ESOPHAGOGASTRODUODENOSCOPY (EGD) WITH PROPOFOL N/A 01/20/2017   Dr. Gala Romney: Medium sized hiatal hernia empiric esophageal dilation for history of dysphagia  . EYE SURGERY     BIL CATARACTS  . IR KYPHO THORACIC WITH BONE BIOPSY  02/08/2017  . IR RADIOLOGIST EVAL & MGMT  02/02/2017  . JOINT REPLACEMENT    . KNEE ARTHROSCOPY Right   . MALONEY DILATION N/A 01/20/2017   Procedure: Venia Minks DILATION;  Surgeon: Daneil Dolin, MD;  Location: [facility];  Service: Endoscopy;  Laterality: N/A;  . POLYPECTOMY  12/22/2017   Procedure: POLYPECTOMY;  Surgeon: Daneil Dolin, MD;  Location: [facility];  Service: Endoscopy;;  . REVERSE SHOULDER ARTHROPLASTY Right 05/24/2014   Procedure: RIGHT SHOULDER REVERSE ARTHROPLASTY;  Surgeon: Netta Cedars, MD;  Location: [place];  Service: Orthopedics;  Laterality: Right;  . REVERSE SHOULDER ARTHROPLASTY Left 06/10/2017   Procedure: LEFT REVERSE SHOULDER ARTHROPLASTY;  Surgeon: Netta Cedars, MD;  Location: [place];  Service: Orthopedics;  Laterality: Left;  . TOTAL KNEE ARTHROPLASTY  01/24/2012   Procedure: TOTAL KNEE ARTHROPLASTY;  Surgeon: Gearlean Alf, MD;  Location: [facility];  Service: Orthopedics;  Laterality: Right;  . TUBAL LIGATION       OB History    Gravida  3   Para  3   Term  2   Preterm  1   AB      Living  3     SAB      TAB      Ectopic      Multiple      Live Births               Home Medications    Prior to Admission medications   Medication Sig Start Date End Date Taking? Authorizing Provider  acetaminophen (TYLENOL 8 HOUR ARTHRITIS PAIN) 650 MG CR tablet Take 1,300 mg by mouth at bedtime.    Yes [provider]  albuterol (PROAIR HFA) 108 (90 BASE)  MCG/ACT inhaler Inhale 2 puffs into the lungs every 6 (six) hours as needed for wheezing or shortness of breath. For COPD   Yes [provider]  ALPRAZolam (XANAX) 0.5 MG tablet Take 0.25-0.5 mg by mouth 2 (two) times daily as needed for anxiety.    Yes [provider]  azithromycin (ZITHROMAX) 250 MG tablet Take 250 mg by mouth daily. 14 day course starting on 10/06/2018   Yes [provider]  Benralizumab (FASENRA) 30 MG/ML SOSY Inject 30 mg into the skin every 8 (eight) weeks.    Yes [provider]  denosumab (PROLIA) 60 MG/ML SOSY injection Inject 60 mg into the skin every 6 (six) months. 03/01/18  Yes [provider]  docusate sodium (COLACE) 100 MG capsule Take 100 mg by mouth daily as needed for mild constipation.   Yes [provider]  EPINEPHrine (EPIPEN 2-PAK) 0.3 mg/0.3 mL IJ SOAJ injection Inject 0.3 mg  into the muscle once.    Yes [provider]  esomeprazole (NEXIUM) 40 MG capsule TAKE 1 CAPSULE BY MOUTH TWICE DAILY. 08/31/18  Yes Mahala Menghini, PA-C  famotidine (PEPCID) 10 MG tablet Take 10 mg by mouth at bedtime.   Yes [provider]  ferrous sulfate 325 (65 FE) MG tablet Take 325 mg by mouth daily with breakfast.   Yes [provider]  fexofenadine (ALLEGRA) 180 MG tablet Take 180 mg by mouth daily.   Yes [provider]  ipratropium-albuterol (DUONEB) 0.5-2.5 (3) MG/3ML SOLN Take 3 mLs by nebulization 2 (two) times daily.    Yes [provider]  ketotifen (ZADITOR) 0.025 % ophthalmic solution Place 1 drop into both eyes 2 (two) times daily.   Yes [provider]  LINZESS 290 MCG CAPS capsule TAKE 1 CAPSULE BY MOUTH DAILY BEFORE BREAKFAST Patient taking differently: Take 290 mcg by mouth daily before breakfast.  08/24/18  Yes Annitta Needs, NP  Magnesium 250 MG TABS Take 250 mg by mouth daily.    Yes [provider]  mometasone-formoterol (DULERA) 100-5 MCG/ACT AERO  Inhale 2 puffs into the lungs 2 (two) times daily.   Yes [provider]  montelukast (SINGULAIR) 10 MG tablet Take 10 mg by mouth daily.  04/12/15  Yes [provider]  olmesartan (BENICAR) 40 MG tablet Take 40 mg by mouth daily.   Yes [provider]  Polyethyl Glycol-Propyl Glycol (SYSTANE OP) Place 1 drop into both eyes 2 (two) times daily.   Yes [provider]  polyethylene glycol (MIRALAX / GLYCOLAX) packet Take 17 g by mouth daily as needed.   Yes [provider]  potassium chloride (K-DUR) 10 MEQ tablet Take 10 mEq by mouth every other day.  11/16/16  Yes [provider]  pravastatin (PRAVACHOL) 10 MG tablet Take 1 tablet by mouth daily. 04/24/18  Yes [provider]  predniSONE (DELTASONE) 10 MG tablet Take 10-40 mg by mouth See admin instructions. 40mg  daily for 4 days, then 30mg  daily for 4 days, then 20mg  daily for 4 days, then 10mg  daily for 4 days, then STOP starting on 10/06/2018   Yes [provider]  torsemide (DEMADEX) 10 MG tablet Take 10 mg by mouth every other day.  11/12/16  Yes [provider]    Family History Family History  Problem Relation Age of Onset  . Breast cancer Sister   . Colon cancer Sister        two deceased, one living and terminal, one current undergoing treatment, ages 100, 44, 72, 35  . Hypertension Mother   . Stroke Mother   . Heart attack Mother   . Hypertension Father   . Prostate cancer Father     Social History Social History   Tobacco Use  . Smoking status: Former Smoker    Packs/day: 2.00    Years: 40.00    Pack years: 80.00    Types: Cigarettes    Quit date: 01/19/1992    Years since quitting: 26.7  . Smokeless tobacco: Never Used  Substance Use Topics  . Alcohol use: No  . Drug use: No     Allergies   Flexeril [cyclobenzaprine], Other, Keflex [cephalexin], Aspirin, Statins, Adhesive [tape], and Chlorhexidine   Review of Systems Review of Systems   Constitutional: Negative for chills and fever.  HENT: Negative for facial swelling and sore throat.   Respiratory: Positive for cough and shortness of breath.   Cardiovascular: Negative  for chest pain.  Gastrointestinal: Negative for abdominal pain, nausea and vomiting.  Genitourinary: Negative for dysuria.  Musculoskeletal: Negative for back pain.  Skin: Negative for rash and wound.  Neurological: Negative for headaches.  Psychiatric/Behavioral: The patient is nervous/anxious.      Physical Exam Updated Vital Signs BP (!) 150/84   Pulse (!) 58   Temp 98.3 F (36.8 C) (Oral)   Resp 20   Ht 5\' 1"  (1.549 m)   Wt 97.1 kg   SpO2 100%   BMI 40.43 kg/m   Physical Exam Vitals signs and nursing note reviewed.  Constitutional:      General: She is not in acute distress.    Appearance: She is well-developed. She is not diaphoretic.  HENT:     Head: Normocephalic and atraumatic.     Mouth/Throat:     Pharynx: No oropharyngeal exudate.  Eyes:     General: No scleral icterus.       Right eye: No discharge.        Left eye: No discharge.     Conjunctiva/sclera: Conjunctivae normal.     Pupils: Pupils are equal, round, and reactive to light.  Neck:     Musculoskeletal: Normal range of motion and neck supple.     Thyroid: No thyromegaly.  Cardiovascular:     Rate and Rhythm: Regular rhythm. Tachycardia present.     Heart sounds: Normal heart sounds. No murmur. No friction rub. No gallop.   Pulmonary:     Effort: Pulmonary effort is normal. No respiratory distress.     Breath sounds: No stridor. Wheezing (expiratory wheezing at bases) present. No rales.  Abdominal:     General: Bowel sounds are normal. There is no distension.     Palpations: Abdomen is soft.     Tenderness: There is no abdominal tenderness. There is no guarding or rebound.  Lymphadenopathy:     Cervical: No cervical adenopathy.  Skin:    General: Skin is warm and dry.     Coloration: Skin is not pale.      Findings: No rash.  Neurological:     Mental Status: She is alert.     Coordination: Coordination normal.  Psychiatric:        Mood and Affect: Mood is anxious.     Comments: Patient very anxious      ED Treatments / Results  Labs (all labs ordered are listed, but only abnormal results are displayed) Labs Reviewed  BASIC METABOLIC PANEL - Abnormal; Notable for the following components:      Result Value   Sodium 131 (*)    Glucose, Bld 115 (*)    BUN 30 (*)    Creatinine, Ser 1.73 (*)    GFR calc non Af Amer 28 (*)    GFR calc Af Amer 33 (*)    All other components within normal limits  CBC WITH DIFFERENTIAL/PLATELET - Abnormal; Notable for the following components:   WBC 12.0 (*)    Hemoglobin 11.4 (*)    MCH 24.7 (*)    nRBC 0.9 (*)    Neutro Abs 9.5 (*)    All other components within normal limits  URINALYSIS, ROUTINE W REFLEX MICROSCOPIC - Abnormal; Notable for the following components:   Color, Urine STRAW (*)    All other components within normal limits  SARS CORONAVIRUS 2 (HOSPITAL ORDER, [facility])    EKG EKG Interpretation  Date/Time:  Wednesday October 11 2018 15:09:39 EDT  Ventricular Rate:  80 PR Interval:  136 QRS Duration: 80 QT Interval:  344 QTC Calculation: 396 R Axis:   74 Text Interpretation:  Normal sinus rhythm with sinus arrhythmia Confirmed by Virgel Manifold [phone]) on 10/11/2018 3:15:23 PM   Radiology Dg Chest 2 View  Result Date: 10/11/2018 CLINICAL DATA:  Shortness of breath EXAM: CHEST - 2 VIEW COMPARISON:  January 21, 2017 FINDINGS: The heart size and mediastinal contours are within normal limits. Both lungs are clear. Bilateral total shoulder arthroplasties are seen. Degenerative changes seen in the midthoracic spine. Cement fixation is seen within the lower thoracic spine vertebral body. No acute osseous findings. IMPRESSION: No acute cardiopulmonary process. Electronically Signed   By: Prudencio Pair M.D.   On:  10/11/2018 19:06    Procedures Procedures (including critical care time)  Medications Ordered in ED Medications  albuterol (VENTOLIN HFA) 108 (90 Base) MCG/ACT inhaler 8 puff (0 puffs Inhalation Hold 10/11/18 1600)  ipratropium (ATROVENT HFA) inhaler 2 puff (0 puffs Inhalation Hold 10/11/18 1835)  ALPRAZolam (XANAX) tablet 0.5 mg (0.5 mg Oral Given 10/11/18 1620)  sodium chloride 0.9 % bolus 1,000 mL (0 mLs Intravenous Stopped 10/11/18 1918)  acetaminophen (TYLENOL) tablet 650 mg (650 mg Oral Given 10/11/18 1743)     Initial Impression / Assessment and Plan / ED Course  I have reviewed the triage vital signs and the nursing notes.  Pertinent labs & imaging results that were available during my care of the patient were reviewed by me and considered in my medical decision making (see chart for details).        Patient presenting with shortness of breath as well as agitation and irritability while taking prednisone.  Patient cannot tolerate the adverse effects.  Her doctor prescribed prednisone for COPD exacerbation.  Her breathing is much improved.  Wheezing initially on exam improved after albuterol and Atrovent inhalers.  Chest x-ray is clear.  Patient does have elevated BUN and creatinine, 30 and 1.73.  IV fluids given.  Patient will be discharged home with close follow-up to PCP for recheck of renal function, as this is mildly elevated from baseline.  Patient advised to stop prednisone, but to continue inhalers and azithromycin until completed.  Follow-up to pulmonary doctor as scheduled.  Return precautions discussed.  Patient understands and agrees with plan.  Patient vital stable throughout ED course and discharged in satisfactory condition. I discussed patient case with Dr. Wilson Singer who guided the patient's management and agrees with plan.   Final Clinical Impressions(s) / ED Diagnoses   Final diagnoses:  Shortness of breath  Adverse effect of drug, initial encounter    ED Discharge  Orders    None       Caryl Ada 10/11/18 1956    Virgel Manifold, MD 10/11/18 2355

## 2018-10-11 NOTE — Discharge Instructions (Addendum)
Stop taking prednisone. Finish azithromycin. Continue inhalers as prescribed. Please follow up with your primary doctor and pulmonary doctor as scheduled. Please return to the emergency department if your develop any new or worsening symptoms.

## 2018-10-11 NOTE — ED Triage Notes (Signed)
Pt from home with sob since this morning. Denies pain but palpitations

## 2018-10-16 DIAGNOSIS — R600 Localized edema: Secondary | ICD-10-CM | POA: Diagnosis not present

## 2018-10-16 DIAGNOSIS — M48061 Spinal stenosis, lumbar region without neurogenic claudication: Secondary | ICD-10-CM | POA: Diagnosis not present

## 2018-10-16 DIAGNOSIS — G8929 Other chronic pain: Secondary | ICD-10-CM | POA: Diagnosis not present

## 2018-10-16 DIAGNOSIS — N183 Chronic kidney disease, stage 3 (moderate): Secondary | ICD-10-CM | POA: Diagnosis not present

## 2018-10-16 DIAGNOSIS — R51 Headache: Secondary | ICD-10-CM | POA: Diagnosis not present

## 2018-10-16 DIAGNOSIS — E249 Cushing's syndrome, unspecified: Secondary | ICD-10-CM | POA: Diagnosis not present

## 2018-10-16 DIAGNOSIS — J82 Pulmonary eosinophilia, not elsewhere classified: Secondary | ICD-10-CM | POA: Diagnosis not present

## 2018-10-16 DIAGNOSIS — R7989 Other specified abnormal findings of blood chemistry: Secondary | ICD-10-CM | POA: Diagnosis not present

## 2018-10-16 DIAGNOSIS — R531 Weakness: Secondary | ICD-10-CM | POA: Diagnosis not present

## 2018-10-16 DIAGNOSIS — J441 Chronic obstructive pulmonary disease with (acute) exacerbation: Secondary | ICD-10-CM | POA: Diagnosis not present

## 2018-10-16 DIAGNOSIS — J45998 Other asthma: Secondary | ICD-10-CM | POA: Diagnosis not present

## 2018-10-16 DIAGNOSIS — K219 Gastro-esophageal reflux disease without esophagitis: Secondary | ICD-10-CM | POA: Diagnosis not present

## 2018-10-16 DIAGNOSIS — R296 Repeated falls: Secondary | ICD-10-CM | POA: Diagnosis not present

## 2018-10-16 DIAGNOSIS — R451 Restlessness and agitation: Secondary | ICD-10-CM | POA: Diagnosis not present

## 2018-10-16 DIAGNOSIS — R062 Wheezing: Secondary | ICD-10-CM | POA: Diagnosis not present

## 2018-10-24 DIAGNOSIS — M5136 Other intervertebral disc degeneration, lumbar region: Secondary | ICD-10-CM | POA: Diagnosis not present

## 2018-10-25 DIAGNOSIS — H905 Unspecified sensorineural hearing loss: Secondary | ICD-10-CM | POA: Diagnosis not present

## 2018-11-01 DIAGNOSIS — M4155 Other secondary scoliosis, thoracolumbar region: Secondary | ICD-10-CM | POA: Insufficient documentation

## 2018-11-01 DIAGNOSIS — M48061 Spinal stenosis, lumbar region without neurogenic claudication: Secondary | ICD-10-CM | POA: Diagnosis not present

## 2018-11-01 DIAGNOSIS — M5136 Other intervertebral disc degeneration, lumbar region: Secondary | ICD-10-CM | POA: Diagnosis not present

## 2018-11-01 DIAGNOSIS — S22070D Wedge compression fracture of T9-T10 vertebra, subsequent encounter for fracture with routine healing: Secondary | ICD-10-CM | POA: Diagnosis not present

## 2018-11-01 DIAGNOSIS — S22070S Wedge compression fracture of T9-T10 vertebra, sequela: Secondary | ICD-10-CM | POA: Diagnosis not present

## 2018-11-01 DIAGNOSIS — M545 Low back pain: Secondary | ICD-10-CM | POA: Diagnosis not present

## 2018-11-02 DIAGNOSIS — Z961 Presence of intraocular lens: Secondary | ICD-10-CM | POA: Diagnosis not present

## 2018-11-02 DIAGNOSIS — H10413 Chronic giant papillary conjunctivitis, bilateral: Secondary | ICD-10-CM | POA: Diagnosis not present

## 2018-11-02 DIAGNOSIS — H26491 Other secondary cataract, right eye: Secondary | ICD-10-CM | POA: Diagnosis not present

## 2018-11-02 DIAGNOSIS — H5213 Myopia, bilateral: Secondary | ICD-10-CM | POA: Diagnosis not present

## 2018-11-02 DIAGNOSIS — H524 Presbyopia: Secondary | ICD-10-CM | POA: Diagnosis not present

## 2018-11-02 DIAGNOSIS — H52203 Unspecified astigmatism, bilateral: Secondary | ICD-10-CM | POA: Diagnosis not present

## 2018-11-02 DIAGNOSIS — H0102A Squamous blepharitis right eye, upper and lower eyelids: Secondary | ICD-10-CM | POA: Diagnosis not present

## 2018-11-02 DIAGNOSIS — H0102B Squamous blepharitis left eye, upper and lower eyelids: Secondary | ICD-10-CM | POA: Diagnosis not present

## 2018-11-02 DIAGNOSIS — H04123 Dry eye syndrome of bilateral lacrimal glands: Secondary | ICD-10-CM | POA: Diagnosis not present

## 2018-11-09 DIAGNOSIS — D721 Eosinophilia: Secondary | ICD-10-CM | POA: Diagnosis not present

## 2018-11-09 DIAGNOSIS — J301 Allergic rhinitis due to pollen: Secondary | ICD-10-CM | POA: Diagnosis not present

## 2018-11-09 DIAGNOSIS — Z87891 Personal history of nicotine dependence: Secondary | ICD-10-CM | POA: Diagnosis not present

## 2018-11-09 DIAGNOSIS — J4550 Severe persistent asthma, uncomplicated: Secondary | ICD-10-CM | POA: Diagnosis not present

## 2018-11-21 ENCOUNTER — Telehealth: Payer: Self-pay | Admitting: *Deleted

## 2018-11-21 DIAGNOSIS — R062 Wheezing: Secondary | ICD-10-CM | POA: Diagnosis not present

## 2018-11-21 DIAGNOSIS — R7989 Other specified abnormal findings of blood chemistry: Secondary | ICD-10-CM | POA: Diagnosis not present

## 2018-11-21 DIAGNOSIS — N3281 Overactive bladder: Secondary | ICD-10-CM | POA: Diagnosis not present

## 2018-11-21 DIAGNOSIS — R51 Headache: Secondary | ICD-10-CM | POA: Diagnosis not present

## 2018-11-21 DIAGNOSIS — J82 Pulmonary eosinophilia, not elsewhere classified: Secondary | ICD-10-CM | POA: Diagnosis not present

## 2018-11-21 DIAGNOSIS — E249 Cushing's syndrome, unspecified: Secondary | ICD-10-CM | POA: Diagnosis not present

## 2018-11-21 DIAGNOSIS — J45998 Other asthma: Secondary | ICD-10-CM | POA: Diagnosis not present

## 2018-11-21 DIAGNOSIS — K219 Gastro-esophageal reflux disease without esophagitis: Secondary | ICD-10-CM | POA: Diagnosis not present

## 2018-11-21 DIAGNOSIS — R531 Weakness: Secondary | ICD-10-CM | POA: Diagnosis not present

## 2018-11-21 DIAGNOSIS — G8929 Other chronic pain: Secondary | ICD-10-CM | POA: Diagnosis not present

## 2018-11-21 DIAGNOSIS — R296 Repeated falls: Secondary | ICD-10-CM | POA: Diagnosis not present

## 2018-11-21 DIAGNOSIS — R600 Localized edema: Secondary | ICD-10-CM | POA: Diagnosis not present

## 2018-11-21 DIAGNOSIS — N183 Chronic kidney disease, stage 3 (moderate): Secondary | ICD-10-CM | POA: Diagnosis not present

## 2018-11-21 NOTE — Telephone Encounter (Signed)
Lmom, waiting on a return call.  

## 2018-11-21 NOTE — Telephone Encounter (Signed)
Pt called in and said that [place] is not doing any good.  She wants to know if we can send something else to [place]?  She said that Amitiza hasn't worked well in the past either.  [phone]

## 2018-11-22 ENCOUNTER — Telehealth: Payer: Self-pay | Admitting: Internal Medicine

## 2018-11-22 NOTE — Telephone Encounter (Signed)
Pt returned call. Pt is currently taking Linzess 290 mcg on an empty stomach qam. Pt has a bowel movement q 2-3 days. Pt reports straining and doesn't want to make hemorrhoids flare. Bowels haven't been completely emptying for pt.  In addition to the Linzess 290 mcg, pt takes Metamucil daily, otc stool softener and a suppository prn. Pt has previously taken Amitiza 8 mcg which caused nausea and it didn't work well for pt. Pt states that she does take pain medications for her back and understands that it may cause constipation. Pt wants to try another medicatio

## 2018-11-22 NOTE — Telephone Encounter (Signed)
[phone] returned call

## 2018-11-24 NOTE — Telephone Encounter (Signed)
No more Metamucil.  No more Amitiza.  Continue Linzess 290 daily, add MiraLAX 1-2 capfuls with a either 8 ounces or 16 ounces of water respectively to regimen in the evenings.  Progress report in 2 weeks.

## 2018-11-24 NOTE — Telephone Encounter (Signed)
Pt notified of RMR's recommendations and will call with an update in 2 weeks.

## 2018-11-27 DIAGNOSIS — M48061 Spinal stenosis, lumbar region without neurogenic claudication: Secondary | ICD-10-CM | POA: Diagnosis not present

## 2018-11-27 DIAGNOSIS — M961 Postlaminectomy syndrome, not elsewhere classified: Secondary | ICD-10-CM | POA: Diagnosis not present

## 2018-11-27 DIAGNOSIS — Z6841 Body Mass Index (BMI) 40.0 and over, adult: Secondary | ICD-10-CM | POA: Diagnosis not present

## 2018-11-27 DIAGNOSIS — M4150 Other secondary scoliosis, site unspecified: Secondary | ICD-10-CM | POA: Insufficient documentation

## 2018-11-27 DIAGNOSIS — M4180 Other forms of scoliosis, site unspecified: Secondary | ICD-10-CM | POA: Diagnosis not present

## 2018-11-30 DIAGNOSIS — M65341 Trigger finger, right ring finger: Secondary | ICD-10-CM | POA: Diagnosis not present

## 2018-11-30 DIAGNOSIS — M65331 Trigger finger, right middle finger: Secondary | ICD-10-CM | POA: Diagnosis not present

## 2018-12-04 DIAGNOSIS — N183 Chronic kidney disease, stage 3 (moderate): Secondary | ICD-10-CM | POA: Diagnosis not present

## 2018-12-04 DIAGNOSIS — R451 Restlessness and agitation: Secondary | ICD-10-CM | POA: Diagnosis not present

## 2018-12-04 DIAGNOSIS — J441 Chronic obstructive pulmonary disease with (acute) exacerbation: Secondary | ICD-10-CM | POA: Diagnosis not present

## 2018-12-04 DIAGNOSIS — M48061 Spinal stenosis, lumbar region without neurogenic claudication: Secondary | ICD-10-CM | POA: Diagnosis not present

## 2018-12-05 DIAGNOSIS — Z79899 Other long term (current) drug therapy: Secondary | ICD-10-CM | POA: Diagnosis not present

## 2018-12-05 DIAGNOSIS — M5136 Other intervertebral disc degeneration, lumbar region: Secondary | ICD-10-CM | POA: Diagnosis not present

## 2018-12-05 DIAGNOSIS — Z5181 Encounter for therapeutic drug level monitoring: Secondary | ICD-10-CM | POA: Diagnosis not present

## 2018-12-25 DIAGNOSIS — M48061 Spinal stenosis, lumbar region without neurogenic claudication: Secondary | ICD-10-CM | POA: Diagnosis not present

## 2018-12-25 DIAGNOSIS — R451 Restlessness and agitation: Secondary | ICD-10-CM | POA: Diagnosis not present

## 2018-12-25 DIAGNOSIS — J441 Chronic obstructive pulmonary disease with (acute) exacerbation: Secondary | ICD-10-CM | POA: Diagnosis not present

## 2019-01-04 DIAGNOSIS — Z23 Encounter for immunization: Secondary | ICD-10-CM | POA: Diagnosis not present

## 2019-01-04 DIAGNOSIS — J4550 Severe persistent asthma, uncomplicated: Secondary | ICD-10-CM | POA: Diagnosis not present

## 2019-01-12 DIAGNOSIS — R600 Localized edema: Secondary | ICD-10-CM | POA: Diagnosis not present

## 2019-01-12 DIAGNOSIS — R296 Repeated falls: Secondary | ICD-10-CM | POA: Diagnosis not present

## 2019-01-12 DIAGNOSIS — R062 Wheezing: Secondary | ICD-10-CM | POA: Diagnosis not present

## 2019-01-12 DIAGNOSIS — R7989 Other specified abnormal findings of blood chemistry: Secondary | ICD-10-CM | POA: Diagnosis not present

## 2019-01-12 DIAGNOSIS — R519 Headache, unspecified: Secondary | ICD-10-CM | POA: Diagnosis not present

## 2019-01-12 DIAGNOSIS — R531 Weakness: Secondary | ICD-10-CM | POA: Diagnosis not present

## 2019-01-12 DIAGNOSIS — E249 Cushing's syndrome, unspecified: Secondary | ICD-10-CM | POA: Diagnosis not present

## 2019-01-12 DIAGNOSIS — G8929 Other chronic pain: Secondary | ICD-10-CM | POA: Diagnosis not present

## 2019-01-12 DIAGNOSIS — N183 Chronic kidney disease, stage 3 unspecified: Secondary | ICD-10-CM | POA: Diagnosis not present

## 2019-01-12 DIAGNOSIS — J8289 Other pulmonary eosinophilia, not elsewhere classified: Secondary | ICD-10-CM | POA: Diagnosis not present

## 2019-01-12 DIAGNOSIS — K219 Gastro-esophageal reflux disease without esophagitis: Secondary | ICD-10-CM | POA: Diagnosis not present

## 2019-01-12 DIAGNOSIS — J45998 Other asthma: Secondary | ICD-10-CM | POA: Diagnosis not present

## 2019-01-15 DIAGNOSIS — F411 Generalized anxiety disorder: Secondary | ICD-10-CM | POA: Diagnosis not present

## 2019-01-15 DIAGNOSIS — E782 Mixed hyperlipidemia: Secondary | ICD-10-CM | POA: Diagnosis not present

## 2019-01-15 DIAGNOSIS — J449 Chronic obstructive pulmonary disease, unspecified: Secondary | ICD-10-CM | POA: Diagnosis not present

## 2019-01-15 DIAGNOSIS — D509 Iron deficiency anemia, unspecified: Secondary | ICD-10-CM | POA: Diagnosis not present

## 2019-01-15 DIAGNOSIS — J45909 Unspecified asthma, uncomplicated: Secondary | ICD-10-CM | POA: Diagnosis not present

## 2019-01-15 DIAGNOSIS — E1122 Type 2 diabetes mellitus with diabetic chronic kidney disease: Secondary | ICD-10-CM | POA: Diagnosis not present

## 2019-01-15 DIAGNOSIS — I10 Essential (primary) hypertension: Secondary | ICD-10-CM | POA: Diagnosis not present

## 2019-01-15 DIAGNOSIS — M069 Rheumatoid arthritis, unspecified: Secondary | ICD-10-CM | POA: Diagnosis not present

## 2019-01-15 DIAGNOSIS — K219 Gastro-esophageal reflux disease without esophagitis: Secondary | ICD-10-CM | POA: Diagnosis not present

## 2019-01-15 DIAGNOSIS — R7301 Impaired fasting glucose: Secondary | ICD-10-CM | POA: Diagnosis not present

## 2019-01-15 DIAGNOSIS — M8580 Other specified disorders of bone density and structure, unspecified site: Secondary | ICD-10-CM | POA: Diagnosis not present

## 2019-01-16 DIAGNOSIS — J45909 Unspecified asthma, uncomplicated: Secondary | ICD-10-CM | POA: Diagnosis not present

## 2019-01-16 DIAGNOSIS — F411 Generalized anxiety disorder: Secondary | ICD-10-CM | POA: Diagnosis not present

## 2019-01-16 DIAGNOSIS — E1122 Type 2 diabetes mellitus with diabetic chronic kidney disease: Secondary | ICD-10-CM | POA: Diagnosis not present

## 2019-01-16 DIAGNOSIS — J441 Chronic obstructive pulmonary disease with (acute) exacerbation: Secondary | ICD-10-CM | POA: Diagnosis not present

## 2019-01-16 DIAGNOSIS — M797 Fibromyalgia: Secondary | ICD-10-CM | POA: Diagnosis not present

## 2019-01-16 DIAGNOSIS — E875 Hyperkalemia: Secondary | ICD-10-CM | POA: Diagnosis not present

## 2019-01-16 DIAGNOSIS — D509 Iron deficiency anemia, unspecified: Secondary | ICD-10-CM | POA: Diagnosis not present

## 2019-01-16 DIAGNOSIS — K219 Gastro-esophageal reflux disease without esophagitis: Secondary | ICD-10-CM | POA: Diagnosis not present

## 2019-01-16 DIAGNOSIS — M1990 Unspecified osteoarthritis, unspecified site: Secondary | ICD-10-CM | POA: Diagnosis not present

## 2019-01-16 DIAGNOSIS — I10 Essential (primary) hypertension: Secondary | ICD-10-CM | POA: Diagnosis not present

## 2019-01-16 DIAGNOSIS — M8580 Other specified disorders of bone density and structure, unspecified site: Secondary | ICD-10-CM | POA: Diagnosis not present

## 2019-02-06 DIAGNOSIS — M797 Fibromyalgia: Secondary | ICD-10-CM | POA: Diagnosis not present

## 2019-02-06 DIAGNOSIS — F411 Generalized anxiety disorder: Secondary | ICD-10-CM | POA: Diagnosis not present

## 2019-02-06 DIAGNOSIS — M1990 Unspecified osteoarthritis, unspecified site: Secondary | ICD-10-CM | POA: Diagnosis not present

## 2019-02-06 DIAGNOSIS — I10 Essential (primary) hypertension: Secondary | ICD-10-CM | POA: Diagnosis not present

## 2019-02-07 ENCOUNTER — Other Ambulatory Visit: Payer: Self-pay

## 2019-02-13 DIAGNOSIS — M25542 Pain in joints of left hand: Secondary | ICD-10-CM | POA: Diagnosis not present

## 2019-02-13 DIAGNOSIS — M1712 Unilateral primary osteoarthritis, left knee: Secondary | ICD-10-CM | POA: Diagnosis not present

## 2019-02-13 DIAGNOSIS — M25541 Pain in joints of right hand: Secondary | ICD-10-CM | POA: Diagnosis not present

## 2019-02-15 ENCOUNTER — Ambulatory Visit: Payer: PPO | Admitting: Nurse Practitioner

## 2019-02-15 NOTE — Progress Notes (Signed)
Referring Provider: Celene Squibb, MD Primary Care Physician:  Celene Squibb, MD Primary GI Physician: Dr. Gala Romney  Chief Complaint  Patient presents with  . Constipation    no bm x 8 days; had bm this morning; started Oxycodone, Miralax daily not helping  . Nausea    vomited 8 days ago and looked like stool  . Abdominal Pain    lower right x 3 weeks    HPI:   Glenda Terry is a 78 y.o. female presenting today with a history of GERD, constipation, and adenomatous colon polyps.  Colonoscopy up-to-date in October 2019 with diverticula in the sigmoid and descending colon and 2 tubular adenomas.  No recommendations to repeat.  Other PMHx as per below.  She presents today for abdominal pain and constipation.   Patient was last seen in our office on 05/09/18 for evaluation of anorectal symptoms.  She described not being able to get herself clean after having a bowel movement.  1-2 BMs daily "soft serve" consistency.  Rectal exam without external lesions, moderate sphincter tone, no masses or significant hemorrhoid tissue.  Stool Hemoccult negative.  Suspected patient may have lax tissue at the anorectum.  Colonoscopy on file from October 2019 reassuring.  Suspected she would benefit from bolstering fiber intake.  Advise she take 1 packet of Metamucil daily.  Return in 6 weeks for possible hemorrhoid banding depending on how she is doing.  Patient called the office on 11/21/2018 reporting Linzess 290 mcg no longer working as well.  She was also taking Metamucil daily, OTC stool softener, and suppository as needed.  Having a BM every 2-3 days.  Reported Amitiza caused nausea in the past.  Dr. Gala Romney recommended stopping Metamucil, continuing Linzess 290 daily, add MiraLAX 1-2 capfuls with either 8 ounces or 16 ounces of water respectively to regimen in the evenings.  Call with progress report in 2 weeks.  Today:  For the last 3-4 weeks she has been having pain in her LLQ. Pain is constant. Up to  8/10, other times it remains stabbing about 5/10. Not worse with eating. Hurts when pressing on it. Nothing has made it better. Started taking Percocet for her back. Abdominal pain started shortly after. Hasn't been able to have a BM. BM this morning, soft, not formed. BM was medium in size. BM incomplete. Can't tell me when her last BM was before that. Thinks it may have been at Thanksgiving, which was 8 days ago. 1 week ago threw up when she was having a BM. BM was soft, not formed. States stools are never formed. Vomit was brown, states it looked like BM. When asked about appearance of coffee grounds, she said not it was light brown. Did not smell. Only threw up once. Next night she had acid taste in her mouth with nausea but no vomiting. This has resolved. No further nausea. No breakthrough reflux symptoms. On Nexium BID and pepcid at night. States since she started taking pepcid at night, she hasn't really had any trouble with reflux. No upper abdominal pain. No NSAIDs.   Denies brbpr or black stools.   Taking Linzess 290 mc daily, MiraLAX 1 capful daily, and one dose of Mag citrate. Drank a lot of water yesterday. States she didn't urinate much until night time. Urine was somewhat dark yesterday but was pale yellow to clear last night. Typically urinates normally, not sure why she didn't pass much urine yesterday.   Reports having something similar happen in  the past. Had to complete part of a bowel prep and got cleaned out well.  Past Medical History:  Diagnosis Date  . Anxiety   . Back pain, chronic   . CKD (chronic kidney disease) stage 3, GFR 30-59 ml/min   . Compression fx, thoracic spine (HCC)    T - 11  . COPD (chronic obstructive pulmonary disease) ([place]) 12/16/2015  . 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 Essential hypertension   . GERD (gastroesophageal reflux disease)   . HOH (hard of hearing)   . Inflammatory arthritis   . Iron deficiency anemia 01/2012  . Pulmonary eosinophilia   .  Spinal stenosis   . Tubular adenoma 11/2012    Past Surgical History:  Procedure Laterality Date  . ABDOMINAL HYSTERECTOMY    . BACK SURGERY    . CARPAL TUNNEL RELEASE Right 4/14  . CATARACT EXTRACTION W/PHACO Left 11/19/2014   Procedure: CATARACT EXTRACTION PHACO AND INTRAOCULAR LENS PLACEMENT (I[facility]);  Surgeon: Williams Che, MD;  Location: [facility];  Service: Ophthalmology;  Laterality: Left;  CDE: 4.77  . CATARACT EXTRACTION W/PHACO Right 02/03/2015   Procedure: CATARACT EXTRACTION PHACO AND INTRAOCULAR LENS PLACEMENT ([place]);  Surgeon: Williams Che, MD;  Location: [facility];  Service: Ophthalmology;  Laterality: Right;  CDE: 4.54  . COLONOSCOPY  06/19/2008   RMR: tortuous and elongated colon with scattered left-sided diverticula/colonic mucosa appeared entirely normal. Prior colonic ulcers had resolved.  . COLONOSCOPY  12/2007   Dr. Gala Romney: Scattered diffuse sigmoid diverticula, 2 areas of ulceration at the hepatic flexure. Biopsies unremarkable.  . COLONOSCOPY N/A 11/20/2012   next TCS 11/2017  . COLONOSCOPY WITH PROPOFOL N/A 12/22/2017   Procedure: COLONOSCOPY WITH PROPOFOL;  Surgeon: Daneil Dolin, MD;  Location: [facility];  Service: Endoscopy;  Laterality: N/A;  7:30am  . DECOMPRESSIVE LUMBAR LAMINECTOMY LEVEL 2  04/20/2012   Procedure: DECOMPRESSIVE LUMBAR LAMINECTOMY LEVEL 2;  Surgeon: Tobi Bastos, MD;  Location: [facility];  Service: Orthopedics;  Laterality: Right;  Decompressive Lumbar Laminectomy of the L4 - L5 and L5 - S1 Complete/Laminectomy L5 on the Right (X-Ray)  . ESOPHAGOGASTRODUODENOSCOPY  12/2007   Dr. 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 Small hiatal hernia. Slightly pale duodenal mucosa (biopsy unremarkable)  . ESOPHAGOGASTRODUODENOSCOPY (EGD) WITH PROPOFOL N/A 01/20/2017   Dr. Gala Romney: Medium sized hiatal hernia empiric esophageal dilation for history of dysphagia  . EYE SURGERY     BIL CATARACTS  . IR KYPHO  THORACIC WITH BONE BIOPSY  02/08/2017  . IR RADIOLOGIST EVAL & MGMT  02/02/2017  . JOINT REPLACEMENT    . KNEE ARTHROSCOPY Right   . MALONEY DILATION N/A 01/20/2017   Procedure: Venia Minks DILATION;  Surgeon: Daneil Dolin, MD;  [facility];  Service: Endoscopy;  Laterality: N/A;  . POLYPECTOMY  12/22/2017   Procedure: POLYPECTOMY;  Surgeon: Daneil Dolin, MD;  Location: [facility];  Service: Endoscopy;;  . REVERSE SHOULDER ARTHROPLASTY Right 05/24/2014   Procedure: RIGHT SHOULDER REVERSE ARTHROPLASTY;  Surgeon: Netta Cedars, MD;  Location: [place];  Service: Orthopedics;  Laterality: Right;  . REVERSE SHOULDER ARTHROPLASTY Left 06/10/2017   Procedure: LEFT REVERSE SHOULDER ARTHROPLASTY;  Surgeon: Netta Cedars, MD;  Location: [place];  Service: Orthopedics;  Laterality: Left;  . TOTAL KNEE ARTHROPLASTY  01/24/2012   Procedure: TOTAL KNEE ARTHROPLASTY;  Surgeon: Gearlean Alf, MD;  Location: [facility];  Service: Orthopedics;  Laterality: Right;  . TUBAL LIGATION  Current Outpatient Medications  Medication Sig Dispense Refill  . albuterol (PROAIR HFA) 108 (90 BASE) MCG/ACT inhaler Inhale 2 puffs into the lungs every 6 (six) hours as needed for wheezing or shortness of breath. For COPD    . ALPRAZolam (XANAX) 0.5 MG tablet Take 0.25-0.5 mg by mouth 2 (two) times daily as needed for anxiety.     . Benralizumab (FASENRA) 30 MG/ML SOSY Inject 30 mg into the skin every 8 (eight) weeks.     Marland Kitchen denosumab (PROLIA) 60 MG/ML SOSY injection Inject 60 mg into the skin every 6 (six) months.    . EPINEPHrine (EPIPEN 2-PAK) 0.3 mg/0.3 mL IJ SOAJ injection Inject 0.3 mg into the muscle once.     Marland Kitchen esomeprazole (NEXIUM) 40 MG capsule TAKE 1 CAPSULE BY MOUTH TWICE DAILY. 60 capsule 5  . famotidine (PEPCID) 10 MG tablet Take 10 mg by mouth at bedtime.    . ferrous sulfate 325 (65 FE) MG tablet Take 325 mg by mouth daily with breakfast.    . fexofenadine (ALLEGRA) 180 MG tablet Take 180 mg by mouth  daily.    Marland Kitchen ipratropium-albuterol (DUONEB) 0.5-2.5 (3) MG/3ML SOLN Take 3 mLs by nebulization 2 (two) times daily.     Marland Kitchen ketotifen (ZADITOR) 0.025 % ophthalmic solution Place 1 drop into both eyes 2 (two) times daily.    Marland Kitchen LINZESS 290 MCG CAPS capsule TAKE 1 CAPSULE BY MOUTH DAILY BEFORE BREAKFAST (Patient taking differently: Take 290 mcg by mouth daily before breakfast. ) 30 capsule 5  . Magnesium 250 MG TABS Take 250 mg by mouth daily.     . mometasone-formoterol (DULERA) 100-5 MCG/ACT AERO Inhale 2 puffs into the lungs 2 (two) times daily.    . montelukast (SINGULAIR) 10 MG tablet Take 10 mg by mouth daily.     Marland Kitchen olmesartan (BENICAR) 40 MG tablet Take 40 mg by mouth daily.    Marland Kitchen oxyCODONE-acetaminophen (PERCOCET) 10-325 MG tablet Take 1 tablet by mouth 2 (two) times daily.    Vladimir Faster Glycol-Propyl Glycol (SYSTANE OP) Place 1 drop into both eyes 2 (two) times daily.    . polyethylene glycol (MIRALAX / GLYCOLAX) packet Take 17 g by mouth daily.     . potassium chloride (K-DUR) 10 MEQ tablet Take 10 mEq by mouth every other day.     . pravastatin (PRAVACHOL) 10 MG tablet Take 1 tablet by mouth daily.    . pregabalin (LYRICA) 75 MG capsule Take 75 mg by mouth 2 (two) times daily.    Marland Kitchen torsemide (DEMADEX) 10 MG tablet Take 10 mg by mouth every other day.      No current facility-administered medications for this visit.    Facility-Administered Medications Ordered in Other Visits  Medication Dose Route Frequency Provider Last Rate Last Dose  . iohexol (OMNIPAQUE) 9 MG/ML oral solution             Allergies as of 02/16/2019 - Review Complete 02/16/2019  Allergen Reaction Noted  . Flexeril [cyclobenzaprine] Anaphylaxis 01/12/2012  . Other Anaphylaxis and Other (See Comments) 02/08/2011  . Keflex [cephalexin] Itching 05/26/2012  . Aspirin Other (See Comments) 05/05/2015  . Statins Swelling and Other (See Comments) 05/17/2011  . Adhesive [tape] Rash 11/01/2014  . Chlorhexidine Rash  11/19/2014    Family History  Problem Relation Age of Onset  . Breast cancer Sister   . Colon cancer Sister        two deceased, one living and terminal, one current undergoing treatment, ages  58, 67, 71, 76  . Hypertension Mother   . Stroke Mother   . Heart attack Mother   . Hypertension Father   . Prostate cancer Father     Social History   Socioeconomic History  . Marital status: Single    Spouse name: Not on file  . Number of children: 3  . Years of education: Not on file  . Highest education level: Not on file  Occupational History  . Not on file  Social Needs  . Financial resource strain: Not on file  . Food insecurity    Worry: Not on file    Inability: Not on file  . Transportation needs    Medical: Not on file    Non-medical: Not on file  Tobacco Use  . Smoking status: Former Smoker    Packs/day: 2.00    Years: 40.00    Pack years: 80.00    Types: Cigarettes    Quit date: 01/19/1992    Years since quitting: 27.0  . Smokeless tobacco: Never Used  Substance and Sexual Activity  . Alcohol use: No  . Drug use: No  . Sexual activity: Never  Lifestyle  . Physical activity    Days per week: Not on file    Minutes per session: Not on file  . Stress: Not on file  Relationships  . Social Herbalist on phone: Not on file    Gets together: Not on file    Attends religious service: Not on file    Active member of club or organization: Not on file    Attends meetings of clubs or organizations: Not on file    Relationship status: Not on file  Other Topics Concern  . Not on file  Social History Narrative  . Not on file    Review of Systems: Gen: Denies fever, chills, lightheadedness, dizziness, or feeling like she will pass out. No unintentional weight loss.  CV: Denies chest pain or palpitations Resp: Has asthma. Shortness of breath with asthma flare. Occasional cough.  GI: See HIP Derm: Denies rash Psych: Denies depression. Admits to anxiety.   Heme: Denies bruising, bleeding.  Physical Exam: BP (!) 147/74   Pulse 84   Temp (!) 96.9 F (36.1 C) (Temporal)   Ht 5' (1.524 m)   Wt 199 lb (90.3 kg)   BMI 38.86 kg/m  General:   Alert and oriented. Pleasant and cooperative. When having her lay down on the exam table, she had worsening of abdominal pain and was grabbing her LLQ. Otherwise, no distress. Uses a cain.  Head:  Normocephalic and atraumatic. Eyes:  Conjuctiva clear without scleral icterus. Heart:  S1, S2 present without murmurs appreciated. Lungs:  Clear to auscultation bilaterally. No wheezes, rales, or rhonchi. No distress.  Abdomen:  +BS, soft, and non-distended. Moderate tenderness to palpation in the LLQ. No rebound or guarding. No HSM or masses noted. Msk:  Symmetrical without gross deformities. Normal posture. Extremities:  Without edema. Neurologic:  Alert and  oriented x4 Psych: Normal mood and affect.

## 2019-02-16 ENCOUNTER — Other Ambulatory Visit: Payer: Self-pay

## 2019-02-16 ENCOUNTER — Telehealth: Payer: Self-pay | Admitting: Gastroenterology

## 2019-02-16 ENCOUNTER — Other Ambulatory Visit (HOSPITAL_COMMUNITY)
Admission: RE | Admit: 2019-02-16 | Discharge: 2019-02-16 | Disposition: A | Discharge status: Home / Self Care | Payer: PPO | Source: Ambulatory Visit | Attending: Gastroenterology | Admitting: Gastroenterology

## 2019-02-16 ENCOUNTER — Encounter: Payer: Self-pay | Admitting: Gastroenterology

## 2019-02-16 ENCOUNTER — Ambulatory Visit (HOSPITAL_COMMUNITY)
Admission: RE | Admit: 2019-02-16 | Discharge: 2019-02-16 | Disposition: A | Discharge status: Home / Self Care | Payer: PPO | Source: Ambulatory Visit | Attending: Gastroenterology | Admitting: Gastroenterology

## 2019-02-16 ENCOUNTER — Ambulatory Visit: Payer: PPO | Admitting: Gastroenterology

## 2019-02-16 VITALS — BP 147/74 | HR 84 | Temp 96.9°F | Ht 60.0 in | Wt 199.0 lb

## 2019-02-16 DIAGNOSIS — R1032 Left lower quadrant pain: Secondary | ICD-10-CM

## 2019-02-16 DIAGNOSIS — K5900 Constipation, unspecified: Secondary | ICD-10-CM

## 2019-02-16 DIAGNOSIS — R112 Nausea with vomiting, unspecified: Secondary | ICD-10-CM | POA: Diagnosis not present

## 2019-02-16 DIAGNOSIS — R1110 Vomiting, unspecified: Secondary | ICD-10-CM | POA: Diagnosis not present

## 2019-02-16 LAB — COMPREHENSIVE METABOLIC PANEL
ALT: 37 U/L (ref 0–44)
AST: 41 U/L (ref 15–41)
Albumin: 4.1 g/dL (ref 3.5–5.0)
Alkaline Phosphatase: 46 U/L (ref 38–126)
Anion gap: 8 (ref 5–15)
BUN: 19 mg/dL (ref 8–23)
CO2: 26 mmol/L (ref 22–32)
Calcium: 9.1 mg/dL (ref 8.9–10.3)
Chloride: 104 mmol/L (ref 98–111)
Creatinine, Ser: 1.33 mg/dL — ABNORMAL HIGH (ref 0.44–1.00)
GFR calc Af Amer: 45 mL/min — ABNORMAL LOW (ref >60)
GFR calc non Af Amer: 39 mL/min — ABNORMAL LOW (ref >60)
Glucose, Bld: 93 mg/dL (ref 70–99)
Potassium: 4.2 mmol/L (ref 3.5–5.1)
Sodium: 138 mmol/L (ref 135–145)
Total Bilirubin: 0.7 mg/dL (ref 0.3–1.2)
Total Protein: 7 g/dL (ref 6.5–8.1)

## 2019-02-16 LAB — CBC WITH DIFFERENTIAL/PLATELET
Abs Immature Granulocytes: 0.03 10*3/uL (ref 0.00–0.07)
Basophils Absolute: 0 10*3/uL (ref 0.0–0.1)
Basophils Relative: 0 %
Eosinophils Absolute: 0 10*3/uL (ref 0.0–0.5)
Eosinophils Relative: 0 %
HCT: 37.4 % (ref 36.0–46.0)
Hemoglobin: 11 g/dL — ABNORMAL LOW (ref 12.0–15.0)
Immature Granulocytes: 0 %
Lymphocytes Relative: 40 %
Lymphs Abs: 4.2 10*3/uL — ABNORMAL HIGH (ref 0.7–4.0)
MCH: 24.4 pg — ABNORMAL LOW (ref 26.0–34.0)
MCHC: 29.4 g/dL — ABNORMAL LOW (ref 30.0–36.0)
MCV: 82.9 fL (ref 80.0–100.0)
Monocytes Absolute: 0.7 10*3/uL (ref 0.1–1.0)
Monocytes Relative: 6 %
Neutro Abs: 5.6 10*3/uL (ref 1.7–7.7)
Neutrophils Relative %: 54 %
Platelets: 291 10*3/uL (ref 150–400)
RBC: 4.51 MIL/uL (ref 3.87–5.11)
RDW: 14.9 % (ref 11.5–15.5)
WBC: 10.5 10*3/uL (ref 4.0–10.5)
nRBC: 0 % (ref 0.0–0.2)

## 2019-02-16 MED ORDER — IOHEXOL 9 MG/ML PO SOLN
ORAL | Status: AC
  Filled 2019-02-16: qty 1000

## 2019-02-16 MED ORDER — IOHEXOL 300 MG/ML  SOLN
100.0000 mL | Freq: Once | INTRAMUSCULAR | Status: AC | PRN
  Administered 2019-02-16: 80 mL via INTRAVENOUS

## 2019-02-16 NOTE — Assessment & Plan Note (Addendum)
78 year old female with GI history of GERD and constipation.  She presents today for further evaluation of LLQ abdominal pain x3-4 weeks.  Pain is constant and sharp about 5/10 in severity.  Worsens at times to 8/10 but cannot tell me what exactly worsens the pain. Started after starting Percocet for back pain.  Also with worsening constipation.  Had soft/mushy BM this morning, but prior to this no BM for about 8 days.  Currently taking Linzess 290 mcg and MiraLAX 1 capful daily.  She does report one episode of light brown emesis when having a BM last week.  No ongoing nausea or vomiting.  Denies red blood per rectum or melena.  No fever or chills.  Exam with moderate tenderness to palpation in the left lower quadrant.  Patient did have worsening of pain when laying back on exam table.  Colonoscopy in October 2019 with diverticula in the sigmoid and descending colon, 2 tubular adenomas.  Differentials include diverticulitis versus pain secondary to worsening constipation in the setting of opioids.  Do not suspect obstruction as patient had BM today and she has no ongoing nausea or vomiting. With patient reporting some dark urine during the day yesterday and not urinating until the evening, I suspect she may have some underlying dehydration and increasing her water intake would also help her constipation.  We will pursue stat CT today to rule out diverticulitis. If CT is without diverticulitis, will have patient continue Linzess and increase MiraLAX to 2 capfuls daily with focus on increasing her water intake as this seemed to help get her bowels moving today.  Considered adding Movantik, but on up-to-date, cautions are mentioned for patients with diverticulitis due to potential for perforation.  Historically Amitiza caused nausea. We will follow-up in 6-8 weeks.  Patient will call with a progress report on Monday.

## 2019-02-16 NOTE — Assessment & Plan Note (Addendum)
Chronic constipation but worsening over the last month with the addition of Percocet for back pain. Currently taking Linzess 290 mcg and 1 capful of MiraLAX daily. Also with constant sharp left lower quadrant abdominal pain x3 weeks as discussed above. Reported one episode of light brown emesis last week.  No further nausea or vomiting.  Had BM this morning, but BM before this was about 8 days ago.  Denies bright red blood per rectum or melena. Colonoscopy up-to-date in October 2019 with diverticulosis in the sigmoid and descending colon and 2 tubular adenomas.  Suspect worsening constipation is likely secondary to addition of opioids for pain.  I suspect patient also has an adequate water intake as she states her urine was somewhat dark yesterday with minimal output and after drinking a lot of water, her urine was more pale yellow to clear and she was able to have a BM this morning.  Doubt obstruction as she had a BM this morning and no ongoing nausea or vomiting. Left lower quadrant pain may be secondary to worsening constipation; however, will need to rule out diverticulitis.  Stat CT today to rule out diverticulitis. If CT is without diverticulitis, will have patient continue Linzess 290 mcg and increase MiraLAX to 2 capfuls daily with focus on increasing her water intake as this seemed to help get her bowels moving today.  Considered adding Movantik, but on up-to-date, cautions are mentioned for patients with diverticulitis due to potential for perforation.  Historically Amitiza caused nausea.  We will follow-up in 6-8 weeks.  Patient will call with a progress report on Monday.

## 2019-02-16 NOTE — Telephone Encounter (Signed)
Can we make follow-up appointment for patient in 2 months?

## 2019-02-16 NOTE — Progress Notes (Signed)
No acute abnormalities. Kidney function slightly improved compared to 4 months ago.  Electrolytes within normal limits.  LFTs within normal limits.  Hemoglobin stable.  Discussed results with patient.

## 2019-02-16 NOTE — Patient Instructions (Signed)
Please go to [facility] to have CT completed. You will have to sit for 2 hours to drink the contrast. We need to rule out diverticulitis. You will also have labs completed while you are at [facility].   We will call with further recommendations to follow.   Aliene Altes, PA-C [facility]

## 2019-02-16 NOTE — Progress Notes (Signed)
No acute abnormalities on CT to explain patient's left lower quadrant abdominal pain.  Suspect her pain is secondary to worsening constipation.  Advise she continue her Linzess 290 mcg daily and add MiraLAX 2 capfuls with 16 ounces of water every evening.  She was advised to focus on increasing her water intake on a regular basis keeping her urine pale yellow to clear.  She is to call us on Monday or Tuesday of next week and let us know how she is doing.  Patient voiced her understanding.

## 2019-02-19 ENCOUNTER — Encounter: Payer: Self-pay | Admitting: Gastroenterology

## 2019-02-19 NOTE — Telephone Encounter (Signed)
PATIENT SCHEDULED AND LETTER SENT  °

## 2019-02-20 ENCOUNTER — Telehealth: Payer: Self-pay | Admitting: Internal Medicine

## 2019-02-20 DIAGNOSIS — F411 Generalized anxiety disorder: Secondary | ICD-10-CM | POA: Diagnosis not present

## 2019-02-20 DIAGNOSIS — I10 Essential (primary) hypertension: Secondary | ICD-10-CM | POA: Diagnosis not present

## 2019-02-20 DIAGNOSIS — M797 Fibromyalgia: Secondary | ICD-10-CM | POA: Diagnosis not present

## 2019-02-20 DIAGNOSIS — M1990 Unspecified osteoarthritis, unspecified site: Secondary | ICD-10-CM | POA: Diagnosis not present

## 2019-02-20 NOTE — Telephone Encounter (Signed)
Spoke with pt. She called with an update. Pt said she is feeling much better and the pain on her side is alomost completely gone. Pt was advised to take Linzess 290 mcg daily and add MiraLAX 2 capfuls with 16 ounces of water every evening at her apt on 02/16/2019. Pt's bowels are moving and loose per pt. Pt would like to know how long she needs to take the Miralax. Per the ov note was daily.

## 2019-02-20 NOTE — Telephone Encounter (Signed)
If her stools are too loose and bothersome to her, she can decrease MiraLAX to 1 capful in 8 oz of water every evening. If she continues with loose stools, she can decrease MiraLAX to every other day. If she decreases MiraLAX to 1 capful and has return of harder stools or decreased frequency, she will need to increase back to 2 capfuls in 16 oz of water.   She should continue focusing on adequate water intake keeping her urine pale yellow to clear.

## 2019-02-20 NOTE — Telephone Encounter (Signed)
Pt has a question about how lone does she need to drink Miralax. Please advise, [phone]

## 2019-02-20 NOTE — Telephone Encounter (Signed)
Noted. Spoke with pt and she is aware of KS's recommendations.

## 2019-03-02 ENCOUNTER — Ambulatory Visit (HOSPITAL_COMMUNITY): Payer: PPO

## 2019-03-02 DIAGNOSIS — D7219 Other eosinophilia: Secondary | ICD-10-CM | POA: Diagnosis not present

## 2019-03-02 DIAGNOSIS — J4550 Severe persistent asthma, uncomplicated: Secondary | ICD-10-CM | POA: Diagnosis not present

## 2019-03-05 ENCOUNTER — Other Ambulatory Visit: Payer: Self-pay

## 2019-03-05 ENCOUNTER — Encounter (HOSPITAL_COMMUNITY)
Admission: RE | Admit: 2019-03-05 | Discharge: 2019-03-05 | Disposition: A | Discharge status: Home / Self Care | Payer: PPO | Source: Ambulatory Visit | Attending: Internal Medicine | Admitting: Internal Medicine

## 2019-03-05 DIAGNOSIS — M810 Age-related osteoporosis without current pathological fracture: Secondary | ICD-10-CM | POA: Insufficient documentation

## 2019-03-05 MED ORDER — DENOSUMAB 60 MG/ML ~~LOC~~ SOSY
60.0000 mg | PREFILLED_SYRINGE | Freq: Once | SUBCUTANEOUS | Status: AC
  Administered 2019-03-05: 60 mg via SUBCUTANEOUS

## 2019-03-06 DIAGNOSIS — R05 Cough: Secondary | ICD-10-CM | POA: Diagnosis not present

## 2019-03-13 DIAGNOSIS — M5136 Other intervertebral disc degeneration, lumbar region: Secondary | ICD-10-CM | POA: Diagnosis not present

## 2019-03-22 DIAGNOSIS — M797 Fibromyalgia: Secondary | ICD-10-CM | POA: Diagnosis not present

## 2019-03-22 DIAGNOSIS — M545 Low back pain: Secondary | ICD-10-CM | POA: Diagnosis not present

## 2019-03-22 DIAGNOSIS — M8580 Other specified disorders of bone density and structure, unspecified site: Secondary | ICD-10-CM | POA: Diagnosis not present

## 2019-03-22 DIAGNOSIS — J441 Chronic obstructive pulmonary disease with (acute) exacerbation: Secondary | ICD-10-CM | POA: Diagnosis not present

## 2019-03-22 DIAGNOSIS — M1990 Unspecified osteoarthritis, unspecified site: Secondary | ICD-10-CM | POA: Diagnosis not present

## 2019-03-22 DIAGNOSIS — I10 Essential (primary) hypertension: Secondary | ICD-10-CM | POA: Diagnosis not present

## 2019-03-22 DIAGNOSIS — F411 Generalized anxiety disorder: Secondary | ICD-10-CM | POA: Diagnosis not present

## 2019-03-22 DIAGNOSIS — R600 Localized edema: Secondary | ICD-10-CM | POA: Diagnosis not present

## 2019-03-29 DIAGNOSIS — M65331 Trigger finger, right middle finger: Secondary | ICD-10-CM | POA: Diagnosis not present

## 2019-03-29 DIAGNOSIS — M65321 Trigger finger, right index finger: Secondary | ICD-10-CM | POA: Diagnosis not present

## 2019-04-18 ENCOUNTER — Other Ambulatory Visit (HOSPITAL_COMMUNITY): Payer: Self-pay | Admitting: Internal Medicine

## 2019-04-18 DIAGNOSIS — Z1231 Encounter for screening mammogram for malignant neoplasm of breast: Secondary | ICD-10-CM

## 2019-04-19 NOTE — Progress Notes (Signed)
Referring Provider: Celene Squibb, MD Primary Care Physician:  Celene Squibb, MD Primary GI Physician: Dr. Gala Romney  Chief Complaint  Patient presents with   Gastroesophageal Reflux    constipation better, vomiting at night    HPI:   Glenda Terry is a 79 y.o. female with GI history of GERD, constipation, adenomatous colon polyps.  Colonoscopy up-to-date in October 2019 with diverticula in the sigmoid and descending colon and 2 tubular adenomas.  No recommendations to repeat.  She is presenting for follow-up of constipation and left lower quadrant abdominal pain.  Last seen in our office on 02/16/2019 for the same.  Patient was taking Linzess 290 mcg daily and MiraLAX 1 capful daily. Constipation was worsening over the last month with the addition of Percocet for back pain.  Also reported constant sharp left lower quadrant abdominal pain x3 weeks.  No bright red blood per rectum or melena.  Suspected constipation was secondary to opioids and inadequate water intake as she reports somewhat dark urine.  Left lower quadrant pain likely secondary to constipation but needed to rule out diverticulitis with CT.  Would also update labs.  Labs completed 02/16/2019 without acute abnormalities.  Kidney function slightly improved with creatinine 1.33, electrolytes within normal limits.  WBC normal.  LFTs normal.  Hemoglobin stable at 11. CT abdomen and pelvis without acute abnormalities.  Patient was advised to continue Linzess 290 mcg daily, add MiraLAX 2 capfuls with 16 ounces of water every evening, focus on increasing her water intake on a regular basis to keep urine pale yellow to clear.  She was to call within a week with a progress report but patient did not call..  Today: Having trouble with nocturnal reflux. Food comes back up when she lays down. Started about 3 weeks ago. Eating around 3pm. Snacks after that until about 6pm. Lays down around 8pm. Snacks include chips, multigrain cereal. Not  eating fried foods. No spicy foods. No soda. No caffeine. Taking Nexium before lunch. Sometimes before bed, but not always. Pepcid at night. No reflux symptoms during the day. Sleeping propped up about 20 degrees and on left side. No nausea. No hematemesis. Feels like she gets full quickly at times. Eats a lot of "roughage." States this has really helped her BMs.  No dysphagia. No abdominal pain.  No unintentional weight loss.  Constipation is much improved.  Having BMs daily that are soft and formed. Taking Linzess 290 mcg. No MiraLAX. No blood in the stool. No black stools.   Received her first covid shot yesterday.   Past Medical History:  Diagnosis Date   Anxiety    Back pain, chronic    CKD (chronic kidney disease) stage 3, GFR 30-59 ml/min    Compression fx, thoracic spine (HCC)    T - 11   COPD (chronic obstructive pulmonary disease) ([place]) 12/16/2015   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HCC)    Essential hypertension    GERD (gastroesophageal reflux disease)    HOH (hard of hearing)    Inflammatory arthritis    Iron deficiency anemia 01/2012   Pulmonary eosinophilia    Spinal stenosis    Tubular adenoma 11/2012    Past Surgical History:  Procedure Laterality Date   ABDOMINAL HYSTERECTOMY     BACK SURGERY     CARPAL TUNNEL RELEASE Right 4/14   CATARACT EXTRACTION W/PHACO Left 11/19/2014   Procedure: CATARACT EXTRACTION PHACO AND INTRAOCULAR LENS PLACEMENT ([place]);  Surgeon: Williams Che, MD;  Location: [facility];  Service: Ophthalmology;  Laterality: Left;  CDE: 4.77   CATARACT EXTRACTION W/PHACO Right 02/03/2015   Procedure: CATARACT EXTRACTION PHACO AND INTRAOCULAR LENS PLACEMENT ([facility]);  Surgeon: Williams Che, MD;  Location: [facility];  Service: Ophthalmology;  Laterality: Right;  CDE: 4.54   COLONOSCOPY  06/19/2008   RMR: tortuous and elongated colon with scattered left-sided diverticula/colonic mucosa appeared entirely normal. Prior colonic ulcers had resolved.    COLONOSCOPY  12/2007   Dr. Gala Romney: Scattered diffuse sigmoid diverticula, 2 areas of ulceration at the hepatic flexure. Biopsies unremarkable.   COLONOSCOPY N/A 11/20/2012   next TCS 11/2017   COLONOSCOPY WITH PROPOFOL N/A 12/22/2017   Procedure: COLONOSCOPY WITH PROPOFOL;  Surgeon: Daneil Dolin, MD;  Location: [facility];  Service: Endoscopy;  Laterality: N/A;  7:30am   DECOMPRESSIVE LUMBAR LAMINECTOMY LEVEL 2  04/20/2012   Procedure: DECOMPRESSIVE LUMBAR LAMINECTOMY LEVEL 2;  Surgeon: Tobi Bastos, MD;  Location: [facility];  Service: Orthopedics;  Laterality: Right;  Decompressive Lumbar Laminectomy of the L4 - L5 and L5 - S1 Complete/Laminectomy L5 on the Right (X-Ray)   ESOPHAGOGASTRODUODENOSCOPY  12/2007   Dr. 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 Small hiatal hernia. Slightly pale duodenal mucosa (biopsy unremarkable)   ESOPHAGOGASTRODUODENOSCOPY (EGD) WITH PROPOFOL N/A 01/20/2017   Dr. Gala Romney: Medium sized hiatal hernia empiric esophageal dilation for history of dysphagia   EYE SURGERY     BIL CATARACTS   IR KYPHO THORACIC WITH BONE BIOPSY  02/08/2017   IR RADIOLOGIST EVAL & MGMT  02/02/2017   JOINT REPLACEMENT     KNEE ARTHROSCOPY Right    MALONEY DILATION N/A 01/20/2017   Procedure: Venia Minks DILATION;  Surgeon: Daneil Dolin, MD;  [facility];  Service: Endoscopy;  Laterality: N/A;   POLYPECTOMY  12/22/2017   Procedure: POLYPECTOMY;  Surgeon: Daneil Dolin, MD;  [facility];  Service: Endoscopy;;   REVERSE SHOULDER ARTHROPLASTY Right 05/24/2014   Procedure: RIGHT SHOULDER REVERSE ARTHROPLASTY;  Surgeon: Netta Cedars, MD;  Location: [facility];  Service: Orthopedics;  Laterality: Right;   REVERSE SHOULDER ARTHROPLASTY Left 06/10/2017   Procedure: LEFT REVERSE SHOULDER ARTHROPLASTY;  Surgeon: Netta Cedars, MD;  Location: [facility];  Service: Orthopedics;  Laterality: Left;   TOTAL KNEE ARTHROPLASTY   01/24/2012   Procedure: TOTAL KNEE ARTHROPLASTY;  Surgeon: Gearlean Alf, MD;  Location: [facility];  Service: Orthopedics;  Laterality: Right;   TUBAL LIGATION      Current Outpatient Medications  Medication Sig Dispense Refill   albuterol (PROAIR HFA) 108 (90 BASE) MCG/ACT inhaler Inhale 2 puffs into the lungs every 6 (six) hours as needed for wheezing or shortness of breath. For COPD     ALPRAZolam (XANAX) 0.5 MG tablet Take 0.25-0.5 mg by mouth 2 (two) times daily as needed for anxiety.      Ascorbic Acid (VITAMIN C PO) Take by mouth daily.     Benralizumab (FASENRA) 30 MG/ML SOSY Inject 30 mg into the skin every 8 (eight) weeks.      denosumab (PROLIA) 60 MG/ML SOSY injection Inject 60 mg into the skin every 6 (six) months.     EPINEPHrine (EPIPEN 2-PAK) 0.3 mg/0.3 mL IJ SOAJ injection Inject 0.3 mg into the muscle once.      esomeprazole (NEXIUM) 40 MG capsule TAKE 1 CAPSULE BY MOUTH TWICE DAILY. 60 capsule 5   famotidine (PEPCID) 10 MG tablet Take 10 mg by mouth at bedtime.  ferrous sulfate 325 (65 FE) MG tablet Take 325 mg by mouth daily with breakfast.     fexofenadine (ALLEGRA) 180 MG tablet Take 180 mg by mouth daily.     ipratropium-albuterol (DUONEB) 0.5-2.5 (3) MG/3ML SOLN Take 3 mLs by nebulization 2 (two) times daily.      ketotifen (ZADITOR) 0.025 % ophthalmic solution Place 1 drop into both eyes 2 (two) times daily.     LINZESS 290 MCG CAPS capsule TAKE 1 CAPSULE BY MOUTH DAILY BEFORE BREAKFAST (Patient taking differently: Take 290 mcg by mouth daily before breakfast. ) 30 capsule 5   Magnesium 250 MG TABS Take 250 mg by mouth daily.      mometasone-formoterol (DULERA) 100-5 MCG/ACT AERO Inhale 2 puffs into the lungs 2 (two) times daily.     montelukast (SINGULAIR) 10 MG tablet Take 10 mg by mouth daily.      olmesartan (BENICAR) 40 MG tablet Take 40 mg by mouth daily.     oxyCODONE-acetaminophen (PERCOCET) 10-325 MG tablet Take 1 tablet by mouth 2 (two)  times daily.     Polyethyl Glycol-Propyl Glycol (SYSTANE OP) Place 1 drop into both eyes 2 (two) times daily.     polyethylene glycol (MIRALAX / GLYCOLAX) packet Take 17 g by mouth as needed.      potassium chloride (K-DUR) 10 MEQ tablet Take 10 mEq by mouth every other day.      pregabalin (LYRICA) 75 MG capsule Take 75 mg by mouth daily.      torsemide (DEMADEX) 10 MG tablet Take 10 mg by mouth every other day.      VITAMIN D PO Take by mouth daily.     No current facility-administered medications for this visit.    Allergies as of 04/20/2019 - Review Complete 04/20/2019  Allergen Reaction Noted   Flexeril [cyclobenzaprine] Anaphylaxis 01/12/2012   Other Anaphylaxis and Other (See Comments) 02/08/2011   Keflex [cephalexin] Itching 05/26/2012   Aspirin Other (See Comments) 05/05/2015   Statins Swelling and Other (See Comments) 05/17/2011   Adhesive [tape] Rash 11/01/2014   Chlorhexidine Rash 11/19/2014    Family History  Problem Relation Age of Onset   Breast cancer Sister    Colon cancer Sister        two deceased, one living and terminal, one current undergoing treatment, ages 58, 96, 84, 33   Hypertension Mother    Stroke Mother    Heart attack Mother    Hypertension Father    Prostate cancer Father     Social History   Socioeconomic History   Marital status: Single    Spouse name: Not on file   Number of children: 3   Years of education: Not on file   Highest education level: Not on file  Occupational History   Not on file  Tobacco Use   Smoking status: Former Smoker    Packs/day: 2.00    Years: 40.00    Pack years: 80.00    Types: Cigarettes    Quit date: 01/19/1992    Years since quitting: 27.2   Smokeless tobacco: Never Used  Substance and Sexual Activity   Alcohol use: No   Drug use: No   Sexual activity: Never  Other Topics Concern   Not on file  Social History Narrative   Not on file   Social Determinants of  Health   Financial Resource Strain:    Difficulty of Paying Living Expenses: Not on file  Food Insecurity:    Worried About  Running Out of Food in the Last Year: Not on file   Ran Out of Food in the Last Year: Not on file  Transportation Needs:    Lack of Transportation (Medical): Not on file   Lack of Transportation (Non-Medical): Not on file  Physical Activity:    Days of Exercise per Week: Not on file   Minutes of Exercise per Session: Not on file  Stress:    Feeling of Stress : Not on file  Social Connections:    Frequency of Communication with Friends and Family: Not on file   Frequency of Social Gatherings with Friends and Family: Not on file   Attends Religious Services: Not on file   Active Member of Clubs or Organizations: Not on file   Attends Archivist Meetings: Not on file   Marital Status: Not on file    Review of Systems: Gen: Denies fever, chills, lightheadedness, dizziness, presyncope, or syncope. CV: Denies chest pain or palpitations Resp: Admits to SOB with exertion.  No shortness of breath at rest.  No regular cough. GI: See HPI Derm: Denies rash Psych: Admits to anxiety. Heme: See HPI  Physical Exam: BP 127/78    Pulse 86    Temp (!) 96.8 F (36 C) (Temporal)    Ht 5' (1.524 m)    Wt 202 lb 12.8 oz (92 kg)    BMI 39.61 kg/m  General:   Alert and oriented. No distress noted. Pleasant and cooperative.  Uses a cane. Head:  Normocephalic and atraumatic. Eyes:  Conjuctiva clear without scleral icterus. Heart:  S1, S2 present without murmurs appreciated. Lungs:  Clear to auscultation bilaterally. No wheezes, rales, or rhonchi. No distress.  Abdomen:  +BS, soft, non-tender and non-distended. No rebound or guarding. No HSM or masses noted. Msk:  Symmetrical without gross deformities. Normal posture. Extremities: Minimal bilateral lower extremity edema. Neurologic:  Alert and  oriented x4 Psych: Normal mood and affect.

## 2019-04-20 ENCOUNTER — Ambulatory Visit: Payer: PPO | Admitting: Gastroenterology

## 2019-04-20 ENCOUNTER — Other Ambulatory Visit: Payer: Self-pay

## 2019-04-20 ENCOUNTER — Encounter: Payer: Self-pay | Admitting: Gastroenterology

## 2019-04-20 VITALS — BP 127/78 | HR 86 | Temp 96.8°F | Ht 60.0 in | Wt 202.8 lb

## 2019-04-20 DIAGNOSIS — K219 Gastro-esophageal reflux disease without esophagitis: Secondary | ICD-10-CM | POA: Diagnosis not present

## 2019-04-20 DIAGNOSIS — K5900 Constipation, unspecified: Secondary | ICD-10-CM

## 2019-04-20 NOTE — Assessment & Plan Note (Addendum)
Chronic.  Not adequately controlled.  About 3 weeks ago, started having breakthrough symptoms when laying down.  Reflux comes up into her throat and she will vomit at times.  Taking Nexium once a day before lunch rather than twice daily.  Taking Pepcid in the evenings before bed.  Last food intake is about 2 hours before laying down.  She does sleep propped up about 20 degrees.  No hematemesis, nausea, dysphagia, abdominal pain, or unintentional weight loss.  She does admit to mild early satiety.  Suspect breakthrough reflux is likely occurring due to not taking Nexium at the correct timing, body habitus, dietary habits.    She is advised to resume taking Nexium 40 mg twice daily.  30 minutes before breakfast and 30 minutes before her last meal around 6 PM. Continue Pepcid before bed. Do not eat within 3 hours of laying down. Try eating 4-6 small meals a day. Sleep at an incline of at least 6 inches.  Advised that she was already at a 6 inch incline, she could consider trying to increase this a little more.   Reinforced GERD diet.  Advised to avoid fried, fatty, greasy, spicy, citrus foods.  Avoid caffeine, carbonated beverages, chocolate. Request she call in 2 weeks with a progress report. Follow-up in 6 months unless earlier visit as needed.

## 2019-04-20 NOTE — Patient Instructions (Addendum)
Please start taking Nexium twice daily.  Take this 30 minutes before breakfast and 30 minutes before your evening snack around 6 PM.  Continue taking Pepcid at night just before bed.  Be sure you are not eating within 3 hours of laying down. Be sure you are sleeping at an incline of at least 6 inches.  You may consider trying to prop the head of your bed up a little more than you are now.  Try eating 4-6 small meals a day.  Continue to avoid fried, fatty, greasy, spicy, citrus foods.  Avoid caffeine, carbonated beverages, and chocolate.  Continue eating plenty of fiber and drinking enough water to keep your urine pale yellow to clear as this seems to really be helping with your constipation.   Continue Linzess 290 mcg daily.  Use MiraLAX 1 capful (17 g) in 8 ounces of water as needed.  Please call in 2 weeks with a progress report.  We will follow up with you in the office in 6 months.  Do not hesitate to call if you have questions or concerns.  Aliene Altes, PA-C [facility]    Food Choices for Gastroesophageal Reflux Disease, Adult When you have gastroesophageal reflux disease (GERD), the foods you eat and your eating habits are very important. Choosing the right foods can help ease your discomfort. Think about working with a nutrition specialist (dietitian) to help you make good choices. What are tips for following this plan?  Meals  Choose healthy foods that are low in fat, such as fruits, vegetables, whole grains, low-fat dairy products, and lean meat, fish, and poultry.  Eat small meals often instead of 3 large meals a day. Eat your meals slowly, and in a place where you are relaxed. Avoid bending over or lying down until 2-3 hours after eating.  Avoid eating meals 2-3 hours before bed.  Avoid drinking a lot of liquid with meals.  Cook foods using methods other than frying. Bake, grill, or broil food instead.  Avoid or  limit: ? Chocolate. ? Peppermint or spearmint. ? Alcohol. ? Pepper. ? Black and decaffeinated coffee. ? Black and decaffeinated tea. ? Bubbly (carbonated) soft drinks. ? Caffeinated energy drinks and soft drinks.  Limit high-fat foods such as: ? Fatty meat or fried foods. ? Whole milk, cream, butter, or ice cream. ? Nuts and nut butters. ? Pastries, donuts, and sweets made with butter or shortening.  Avoid foods that cause symptoms. These foods may be different for everyone. Common foods that cause symptoms include: ? Tomatoes. ? Oranges, lemons, and limes. ? Peppers. ? Spicy food. ? Onions and garlic. ? Vinegar. Lifestyle  Maintain a healthy weight. Ask your doctor what weight is healthy for you. If you need to lose weight, work with your doctor to do so safely.  Exercise for at least 30 minutes for 5 or more days each week, or as told by your doctor.  Wear loose-fitting clothes.  Do not smoke. If you need help quitting, ask your doctor.  Sleep with the head of your bed higher than your feet. Use a wedge under the mattress or blocks under the bed frame to raise the head of the bed. Summary  When you have gastroesophageal reflux disease (GERD), food and lifestyle choices are very important in easing your symptoms.  Eat small meals often instead of 3 large meals a day. Eat your meals slowly, and in a place where you are relaxed.  Limit high-fat foods such as fatty meat  or fried foods.  Avoid bending over or lying down until 2-3 hours after eating.  Avoid peppermint and spearmint, caffeine, alcohol, and chocolate. This information is not intended to replace advice given to you by your health care provider. Make sure you discuss any questions you have with your health care provider. Document Revised: 06/22/2018 Document Reviewed: 04/06/2016 Elsevier Patient Education  [place].

## 2019-04-20 NOTE — Assessment & Plan Note (Addendum)
Chronic.  Much improved since increasing water intake and adding more "roughage".  Also continues taking Linzess 290 mcg daily.  No longer requiring MiraLAX.  Having soft, formed BMs daily.  No bright red blood per rectum or melena.  No unintentional weight loss.  Colonoscopy up-to-date in October 2019 with 2 tubular adenomas.  No recommendations to repeat.  Continue Linzess 290 mcg daily. Use MiraLAX 1 capful (17 g) in 8 ounces of water daily as needed. Continue drinking enough water to keep urine pale yellow to clear. Continue eating plenty of fiber through fruits and vegetables. Follow-up in 6 months.

## 2019-04-23 NOTE — Progress Notes (Signed)
CC'ED TO PCP 

## 2019-04-24 NOTE — Progress Notes (Addendum)
Office Visit Note  Patient: Glenda Terry             Date of Birth: 06-Jan-1941           MRN: [id number]             PCP: Celene Squibb, MD Referring: Celene Squibb, MD Visit Date: 04/25/2019 Occupation: @GUAROCC @  Subjective:  Pain in multiple joints.   History of Present Illness: Glenda Terry is a 79 y.o. female seen in consultation per request of her PCP.  According to patient she has had history of joint pain for many years.  She states she has underwent bilateral total shoulder replacement and right knee replacement in the past.  She is also had bilateral carpal tunnel release and kyphoplasty for vertebral fractures.  She states she has history of degenerative disease of lumbar spine requiring discectomy.  She gives history of a spinal stenosis.  She states the cortisone injections used to help but they are not effective anymore.  She is also going to pain management but the medications are not effective.  She states she has underlying scoliosis.  According to patient in the last 6 months her pain has gotten worse.  She has been experiencing pain in her both her elbows, wrist joints, hands, lower back, knee joints, ankles and her feet.  She states she has noted swelling in her hands, knee joints, ankles and her feet.  There is no family history of autoimmune disease.  Activities of Daily Living:  Patient reports morning stiffness for 4 hours.   Patient Reports nocturnal pain.  Difficulty dressing/grooming: Reports Difficulty climbing stairs: Reports Difficulty getting out of chair: Reports Difficulty using hands for taps, buttons, cutlery, and/or writing: Reports  Review of Systems  Constitutional: Positive for fatigue. Negative for night sweats, weight gain and weight loss.  HENT: Negative for mouth sores, trouble swallowing, trouble swallowing, mouth dryness and nose dryness.   Eyes: Negative for pain, redness, visual disturbance and dryness.  Respiratory: Positive for  shortness of breath. Negative for cough and difficulty breathing.   Cardiovascular: Positive for swelling in legs/feet. Negative for chest pain, palpitations, hypertension and irregular heartbeat.  Gastrointestinal: Negative for blood in stool, constipation and diarrhea.  Endocrine: Negative for increased urination.  Genitourinary: Negative for vaginal dryness.  Musculoskeletal: Positive for arthralgias, joint pain, joint swelling, myalgias, morning stiffness and myalgias. Negative for muscle weakness and muscle tenderness.  Skin: Negative for color change, rash, hair loss, skin tightness, ulcers and sensitivity to sunlight.  Allergic/Immunologic: Negative for susceptible to infections.  Neurological: Negative for dizziness, memory loss, night sweats and weakness.  Hematological: Negative for swollen glands.  Psychiatric/Behavioral: Negative for depressed mood and sleep disturbance. The patient is nervous/anxious.     PMFS History:  Patient Active Problem List   Diagnosis Date Noted  . Left lower quadrant abdominal pain 02/16/2019  . Constipation 11/28/2017  . Hx of adenomatous colonic polyps 11/28/2017  . 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 Eosinophil count raised 06/09/2016  . Severe persistent asthma 06/07/2016  . COPD (chronic obstructive pulmonary disease) ([place]) 12/16/2015  . Asthma exacerbation 09/20/2015  . Acute respiratory failure ([place]) 09/20/2015  . CKD (chronic kidney disease) stage 3, GFR 30-59 ml/min 09/20/2015  . Hemorrhoids, internal 08/21/2015  . 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 Microcytic anemia 11/01/2012  . Family hx of colon cancer 11/01/2012  . 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 Herniated lumbar intervertebral disc  04/20/2012  . Difficulty in walking(719.7) 03/13/2012  . Stiffness of joint, not elsewhere classified, lower leg 03/13/2012  . Weakness of right leg 03/13/2012  . OA (osteoarthritis) of knee  01/24/2012  . Anxiety 02/09/2011  . HTN (hypertension) 02/09/2011  . Arthritis 02/09/2011  . GERD (gastroesophageal reflux disease) 02/09/2011  . Rheumatoid arthritis ([place]) 02/09/2011    Past Medical History:  Diagnosis Date  . Anxiety   . Back pain, chronic   . CKD (chronic kidney disease) stage 3, GFR 30-59 ml/min   . Compression fx, thoracic spine (HCC)    T - 11  . COPD (chronic obstructive pulmonary disease) ([place]) 12/16/2015  . 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 Essential hypertension   . GERD (gastroesophageal reflux disease)   . HOH (hard of hearing)   . Inflammatory arthritis   . Iron deficiency anemia 01/2012  . Pulmonary eosinophilia   . Spinal stenosis   . Tubular adenoma 11/2012    Family History  Problem Relation Age of Onset  . Breast cancer Sister   . Colon cancer Sister        two deceased, one living and terminal, one current undergoing treatment, ages 38, 50, 31, 51  . Hypertension Mother   . Stroke Mother   . Heart attack Mother   . Hypertension Father   . Prostate cancer Father    Past Surgical History:  Procedure Laterality Date  . ABDOMINAL HYSTERECTOMY    . BACK SURGERY    . CARPAL TUNNEL RELEASE Right 4/14  . CATARACT EXTRACTION W/PHACO Left 11/19/2014   Procedure: CATARACT EXTRACTION PHACO AND INTRAOCULAR LENS PLACEMENT ([facility]);  Surgeon: Williams Che, MD;  Location: [facility];  Service: Ophthalmology;  Laterality: Left;  CDE: 4.77  . CATARACT EXTRACTION W/PHACO Right 02/03/2015   Procedure: CATARACT EXTRACTION PHACO AND INTRAOCULAR LENS PLACEMENT ([place]);  Surgeon: Williams Che, MD;  Location: [facility];  Service: Ophthalmology;  Laterality: Right;  CDE: 4.54  . COLONOSCOPY  06/19/2008   RMR: tortuous and elongated colon with scattered left-sided diverticula/colonic mucosa appeared entirely normal. Prior colonic ulcers had resolved.  . COLONOSCOPY  12/2007   Dr. Gala Romney: Scattered diffuse sigmoid diverticula, 2 areas of ulceration at the hepatic flexure.  Biopsies unremarkable.  . COLONOSCOPY N/A 11/20/2012   next TCS 11/2017  . COLONOSCOPY WITH PROPOFOL N/A 12/22/2017   Procedure: COLONOSCOPY WITH PROPOFOL;  Surgeon: Daneil Dolin, MD;  [facility];  Service: Endoscopy;  Laterality: N/A;  7:30am  . DECOMPRESSIVE LUMBAR LAMINECTOMY LEVEL 2  04/20/2012   Procedure: DECOMPRESSIVE LUMBAR LAMINECTOMY LEVEL 2;  Surgeon: Tobi Bastos, MD;  Location: [facility];  Service: Orthopedics;  Laterality: Right;  Decompressive Lumbar Laminectomy of the L4 - L5 and L5 - S1 Complete/Laminectomy L5 on the Right (X-Ray)  . ESOPHAGOGASTRODUODENOSCOPY  12/2007   Dr. 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 Small hiatal hernia. Slightly pale duodenal mucosa (biopsy unremarkable)  . ESOPHAGOGASTRODUODENOSCOPY (EGD) WITH PROPOFOL N/A 01/20/2017   Dr. Gala Romney: Medium sized hiatal hernia empiric esophageal dilation for history of dysphagia  . EYE SURGERY     BIL CATARACTS  . IR KYPHO THORACIC WITH BONE BIOPSY  02/08/2017  . IR RADIOLOGIST EVAL & MGMT  02/02/2017  . JOINT REPLACEMENT    . KNEE ARTHROSCOPY Right   . MALONEY DILATION N/A 01/20/2017   Procedure: Venia Minks DILATION;  Surgeon: Daneil Dolin, MD;  [facility];  Service: Endoscopy;  Laterality: N/A;  .  POLYPECTOMY  12/22/2017   Procedure: POLYPECTOMY;  Surgeon: Daneil Dolin, MD;  [facility];  Service: Endoscopy;;  . REVERSE SHOULDER ARTHROPLASTY Right 05/24/2014   Procedure: RIGHT SHOULDER REVERSE ARTHROPLASTY;  Surgeon: Netta Cedars, MD;  Location: [facility];  Service: Orthopedics;  Laterality: Right;  . REVERSE SHOULDER ARTHROPLASTY Left 06/10/2017   Procedure: LEFT REVERSE SHOULDER ARTHROPLASTY;  Surgeon: Netta Cedars, MD;  Location: [facility];  Service: Orthopedics;  Laterality: Left;  . TOTAL KNEE ARTHROPLASTY  01/24/2012   Procedure: TOTAL KNEE ARTHROPLASTY;  Surgeon: Gearlean Alf, MD;  Location: [facility];  Service: Orthopedics;   Laterality: Right;  . TUBAL LIGATION     Social History   Social History Narrative  . Not on file   Immunization History  Administered Date(s) Administered  . Influenza-Unspecified 12/13/2013, 12/15/2015, 11/23/2016  . Pneumococcal Conjugate-13 12/18/2015  . Pneumococcal Polysaccharide-23 01/29/2016     Objective: Vital Signs: BP 133/80 (BP Location: Right Arm, Patient Position: Sitting, Cuff Size: Large)   Pulse 94   Resp 15   Ht 4\' 11"  (1.499 m)   Wt 202 lb 6.4 oz (91.8 kg)   BMI 40.88 kg/m    Physical Exam Vitals and nursing note reviewed.  Constitutional:      Appearance: She is well-developed.     Comments: Patient had a lot of difficulty with mobility despite having cane.  HENT:     Head: Normocephalic and atraumatic.  Eyes:     Conjunctiva/sclera: Conjunctivae normal.  Cardiovascular:     Rate and Rhythm: Normal rate and regular rhythm.     Heart sounds: Normal heart sounds.  Pulmonary:     Effort: Pulmonary effort is normal.     Breath sounds: Normal breath sounds.  Abdominal:     General: Bowel sounds are normal.     Palpations: Abdomen is soft.  Musculoskeletal:     Cervical back: Normal range of motion.  Lymphadenopathy:     Cervical: No cervical adenopathy.  Skin:    General: Skin is warm and dry.     Capillary Refill: Capillary refill takes less than 2 seconds.  Neurological:     Mental Status: She is alert and oriented to person, place, and time.  Psychiatric:        Behavior: Behavior normal.      Musculoskeletal Exam: C-spine was in good range of motion.  She has scoliosis and limited range of motion of her thoracic and lumbar spine.  Shoulder joints are replaced with abduction up to current 40 degrees.  Elbow joints, wrist joints, MCPs, PIPs and DIPs were in good range of motion with no synovitis.  Although she had tenderness on palpation of her wrist joints and PIP joints.  Hip joints were difficult to assess in her sitting position.  Right  knee joint is replaced.  She has discomfort range of motion of bilateral knee joints.  No warmth swelling or effusion was noted in the left knee joint.  She has bilateral pedal edema.  No MTP PIP swelling or synovitis was noted.  CDAI Exam: CDAI Score: -- Patient Global: --; Provider Global: -- Swollen: --; Tender: -- Joint Exam 04/25/2019   No joint exam has been documented for this visit   There is currently no information documented on the homunculus. Go to the Rheumatology activity and complete the homunculus joint exam.  Investigation: No additional findings.  Imaging: XR Foot 2 Views Left  Result Date: 04/25/2019 PIP and DIP narrowing was noted.  No MTP, intertarsal or tibiotalar joint space narrowing was noted. Impression: Mild osteoarthritic changes were noted in the foot.  XR Foot 2 Views Right  Result Date: 04/25/2019 PIP and DIP narrowing was noted.  No MTP, intertarsal or tibiotalar joint space narrowing was noted. Impression: Mild osteoarthritic changes were noted in the foot.  XR Hand 2 View Left  Result Date: 04/25/2019 No MCP, intercarpal radiocarpal joint space narrowing was noted.  No erosive changes were noted.  PIP and DIP narrowing was noted.  Subluxation of left third PIP and left first DIP joint was noted. Impression: These findings are consistent with osteoarthritis of the hand.  XR Hand 2 View Right  Result Date: 04/25/2019 No MCP, intercarpal radiocarpal joint space narrowing was noted.  No erosive changes were noted.  PIP and DIP narrowing was noted.  Some spurring of the right fourth distal phalanx was noted most likely due to a previous injury. Impression: These findings are consistent with osteoarthritis of the hand.  XR KNEE 3 VIEW LEFT  Result Date: 04/25/2019 Moderate medial compartment narrowing with medial and lateral osteophytes was noted.  Intercondylar osteophytes were noted.  No chondrocalcinosis was noted.  Moderate patellofemoral narrowing  was noted. Impression: These findings are consistent with moderate osteoarthritis and moderate chondromalacia patella.   Recent Labs: Lab Results  Component Value Date   WBC 10.5 02/16/2019   HGB 11.0 (L) 02/16/2019   PLT 291 02/16/2019   NA 138 02/16/2019   K 4.2 02/16/2019   CL 104 02/16/2019   CO2 26 02/16/2019   GLUCOSE 93 02/16/2019   BUN 19 02/16/2019   CREATININE 1.33 (H) 02/16/2019   BILITOT 0.7 02/16/2019   ALKPHOS 46 02/16/2019   AST 41 02/16/2019   ALT 37 02/16/2019   PROT 7.0 02/16/2019   ALBUMIN 4.1 02/16/2019   CALCIUM 9.1 02/16/2019   GFRAA 45 (L) 02/16/2019    Speciality Comments: No specialty comments available.  Procedures:  No procedures performed Allergies: Flexeril [cyclobenzaprine], Other, Keflex [cephalexin], Aspirin, Statins, Adhesive [tape], and Chlorhexidine   Assessment / Plan:     Visit Diagnoses: Pain in both hands -patient complains of pain and discomfort in her bilateral hands.  She had tenderness on examination.  No synovitis was noted.  I will obtain x-rays and labs today.  Plan: XR Hand 2 View Right, XR Hand 2 View Left.  The x-ray findings were consistent with osteoarthritis with DIP and PIP narrowing.  History of replacement of both shoulder joints-she had bilateral total shoulder replacement and has chronic discomfort but fairly good range of motion.  Primary osteoarthritis of left knee -she complains of left knee joint discomfort.  No warmth swelling or effusion was noted.  Plan: XR KNEE 3 VIEW LEFT.  The x-ray showed moderate osteoarthritis and moderate chondromalacia patella. S/P total knee arthroplasty, right-she has mild warmth on palpation.  She complains of chronic discomfort.  Pain in both feet -she has bilateral pedal edema.  She complains of discomfort in her bilateral feet.  I will obtain following labs and x-rays today.  Plan: XR Foot 2 Views Right, XR Foot 2 Views Left, x-ray findings were consistent with osteoarthritis.   Rheumatoid factor, Sedimentation rate, Uric acid, Cyclic citrul peptide antibody, IgG, ANA  Spinal stenosis of lumbar region without neurogenic claudication-patient complains of chronic pain in her back.  She states she has had cortisone injections and also pain medications without much relief.  She states she also has underlying scoliosis.  She was to be  referred to a spine specialist.  We will make the referral.  Herniated lumbar intervertebral disc  History of vertebral compression fracture - T11 compression fracture status post kyphoplasty.  Osteopenia of multiple sites - She is on Prolia injections.  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 XR Hand 2 View Right  . XR Hand 2 View Left  . XR KNEE 3 VIEW LEFT  . XR Foot 2 Views Right  . XR Foot 2 Views Left  . Rheumatoid factor  . Sedimentation rate  . Uric acid  . Cyclic citrul peptide antibody, IgG  . ANA   No orders of the defined types were placed in this encounter.   Face-to-face time spent with patient was 60 minutes. Greater than 50% of time was spent in counseling and coordination of care.  Follow-Up Instructions: Return for Polyarthralgia.   Bo Merino, MD  Note - This record has been created using Editor, commissioning.  Chart creation errors have been sought, but may not always  have been located. Such creation errors do not reflect on  the standard of medical care.

## 2019-04-25 ENCOUNTER — Other Ambulatory Visit: Payer: Self-pay

## 2019-04-25 ENCOUNTER — Ambulatory Visit (INDEPENDENT_AMBULATORY_CARE_PROVIDER_SITE_OTHER): Payer: PPO

## 2019-04-25 ENCOUNTER — Ambulatory Visit: Payer: Self-pay

## 2019-04-25 ENCOUNTER — Ambulatory Visit: Payer: PPO | Admitting: Rheumatology

## 2019-04-25 ENCOUNTER — Encounter: Payer: Self-pay | Admitting: Rheumatology

## 2019-04-25 VITALS — BP 133/80 | HR 94 | Resp 15 | Ht 59.0 in | Wt 202.4 lb

## 2019-04-25 DIAGNOSIS — M79641 Pain in right hand: Secondary | ICD-10-CM | POA: Diagnosis not present

## 2019-04-25 DIAGNOSIS — M1712 Unilateral primary osteoarthritis, left knee: Secondary | ICD-10-CM | POA: Diagnosis not present

## 2019-04-25 DIAGNOSIS — M48061 Spinal stenosis, lumbar region without neurogenic claudication: Secondary | ICD-10-CM

## 2019-04-25 DIAGNOSIS — J8289 Other pulmonary eosinophilia, not elsewhere classified: Secondary | ICD-10-CM | POA: Diagnosis not present

## 2019-04-25 DIAGNOSIS — M79672 Pain in left foot: Secondary | ICD-10-CM

## 2019-04-25 DIAGNOSIS — Z96611 Presence of right artificial shoulder joint: Secondary | ICD-10-CM | POA: Diagnosis not present

## 2019-04-25 DIAGNOSIS — Z96651 Presence of right artificial knee joint: Secondary | ICD-10-CM

## 2019-04-25 DIAGNOSIS — M2550 Pain in unspecified joint: Secondary | ICD-10-CM | POA: Diagnosis not present

## 2019-04-25 DIAGNOSIS — M79671 Pain in right foot: Secondary | ICD-10-CM

## 2019-04-25 DIAGNOSIS — M79642 Pain in left hand: Secondary | ICD-10-CM

## 2019-04-25 DIAGNOSIS — Z86010 Personal history of colonic polyps: Secondary | ICD-10-CM

## 2019-04-25 DIAGNOSIS — M5126 Other intervertebral disc displacement, lumbar region: Secondary | ICD-10-CM | POA: Diagnosis not present

## 2019-04-25 DIAGNOSIS — J4550 Severe persistent asthma, uncomplicated: Secondary | ICD-10-CM | POA: Diagnosis not present

## 2019-04-25 DIAGNOSIS — Z8719 Personal history of other diseases of the digestive system: Secondary | ICD-10-CM

## 2019-04-25 DIAGNOSIS — E249 Cushing's syndrome, unspecified: Secondary | ICD-10-CM | POA: Diagnosis not present

## 2019-04-25 DIAGNOSIS — Z8781 Personal history of (healed) traumatic fracture: Secondary | ICD-10-CM

## 2019-04-25 DIAGNOSIS — Z96612 Presence of left artificial shoulder joint: Secondary | ICD-10-CM

## 2019-04-25 DIAGNOSIS — M8589 Other specified disorders of bone density and structure, multiple sites: Secondary | ICD-10-CM

## 2019-04-25 DIAGNOSIS — Z8709 Personal history of other diseases of the respiratory system: Secondary | ICD-10-CM

## 2019-04-25 DIAGNOSIS — I10 Essential (primary) hypertension: Secondary | ICD-10-CM

## 2019-04-25 NOTE — Addendum Note (Signed)
Addended by: Shona Needles on: 04/25/2019 01:53 PM   Modules accepted: Orders

## 2019-04-26 NOTE — Progress Notes (Signed)
All the labs are within normal limits.  We will discuss results at the follow-up visit.

## 2019-04-27 DIAGNOSIS — J449 Chronic obstructive pulmonary disease, unspecified: Secondary | ICD-10-CM | POA: Diagnosis not present

## 2019-04-27 DIAGNOSIS — Z7952 Long term (current) use of systemic steroids: Secondary | ICD-10-CM | POA: Diagnosis not present

## 2019-04-27 DIAGNOSIS — Z87891 Personal history of nicotine dependence: Secondary | ICD-10-CM | POA: Diagnosis not present

## 2019-04-27 DIAGNOSIS — D7219 Other eosinophilia: Secondary | ICD-10-CM | POA: Diagnosis not present

## 2019-04-27 DIAGNOSIS — Z7951 Long term (current) use of inhaled steroids: Secondary | ICD-10-CM | POA: Diagnosis not present

## 2019-04-27 DIAGNOSIS — Z79899 Other long term (current) drug therapy: Secondary | ICD-10-CM | POA: Diagnosis not present

## 2019-04-27 NOTE — Progress Notes (Signed)
Please add ENA, C3 and C4.

## 2019-04-30 LAB — SJOGREN'S SYNDROME ANTIBODS(SSA + SSB)
SSA (Ro) (ENA) Antibody, IgG: <1 AI
SSB (La) (ENA) Antibody, IgG: <1 AI

## 2019-04-30 LAB — TEST AUTHORIZATION

## 2019-04-30 LAB — RHEUMATOID FACTOR: Rheumatoid fact SerPl-aCnc: <14 IU/mL (ref <14)

## 2019-04-30 LAB — C3 AND C4
C3 Complement: 149 mg/dL (ref 83–193)
C4 Complement: 37 mg/dL (ref 15–57)

## 2019-04-30 LAB — ANTI-SCLERODERMA ANTIBODY: Scleroderma (Scl-70) (ENA) Antibody, IgG: <1 AI

## 2019-04-30 LAB — ANA: Anti Nuclear Antibody (ANA): POSITIVE — AB

## 2019-04-30 LAB — SM AND SM/RNP ANTIBODIES
ENA SM Ab Ser-aCnc: <1 AI
SM/RNP: 3.7 AI — AB

## 2019-04-30 LAB — SEDIMENTATION RATE: Sed Rate: 29 mm/h (ref 0–30)

## 2019-04-30 LAB — ANTI-NUCLEAR AB-TITER (ANA TITER): ANA Titer 1: 1:320 {titer} — ABNORMAL HIGH

## 2019-04-30 LAB — URIC ACID: Uric Acid, Serum: 6.1 mg/dL (ref 2.5–7.0)

## 2019-04-30 LAB — ANTI-DNA ANTIBODY, DOUBLE-STRANDED: ds DNA Ab: 1 IU/mL

## 2019-04-30 LAB — CYCLIC CITRUL PEPTIDE ANTIBODY, IGG: Cyclic Citrullin Peptide Ab: <16 UNITS

## 2019-05-10 DIAGNOSIS — M4316 Spondylolisthesis, lumbar region: Secondary | ICD-10-CM | POA: Diagnosis not present

## 2019-05-10 DIAGNOSIS — M5136 Other intervertebral disc degeneration, lumbar region: Secondary | ICD-10-CM | POA: Diagnosis not present

## 2019-05-10 DIAGNOSIS — M549 Dorsalgia, unspecified: Secondary | ICD-10-CM | POA: Diagnosis not present

## 2019-05-10 DIAGNOSIS — M47816 Spondylosis without myelopathy or radiculopathy, lumbar region: Secondary | ICD-10-CM | POA: Diagnosis not present

## 2019-05-14 DIAGNOSIS — R7989 Other specified abnormal findings of blood chemistry: Secondary | ICD-10-CM | POA: Diagnosis not present

## 2019-05-14 DIAGNOSIS — R600 Localized edema: Secondary | ICD-10-CM | POA: Diagnosis not present

## 2019-05-14 DIAGNOSIS — J45998 Other asthma: Secondary | ICD-10-CM | POA: Diagnosis not present

## 2019-05-14 DIAGNOSIS — G8929 Other chronic pain: Secondary | ICD-10-CM | POA: Diagnosis not present

## 2019-05-14 DIAGNOSIS — R296 Repeated falls: Secondary | ICD-10-CM | POA: Diagnosis not present

## 2019-05-14 DIAGNOSIS — E249 Cushing's syndrome, unspecified: Secondary | ICD-10-CM | POA: Diagnosis not present

## 2019-05-14 DIAGNOSIS — R519 Headache, unspecified: Secondary | ICD-10-CM | POA: Diagnosis not present

## 2019-05-14 DIAGNOSIS — R531 Weakness: Secondary | ICD-10-CM | POA: Diagnosis not present

## 2019-05-14 DIAGNOSIS — R062 Wheezing: Secondary | ICD-10-CM | POA: Diagnosis not present

## 2019-05-14 DIAGNOSIS — N183 Chronic kidney disease, stage 3 unspecified: Secondary | ICD-10-CM | POA: Diagnosis not present

## 2019-05-14 DIAGNOSIS — J8289 Other pulmonary eosinophilia, not elsewhere classified: Secondary | ICD-10-CM | POA: Diagnosis not present

## 2019-05-14 DIAGNOSIS — K219 Gastro-esophageal reflux disease without esophagitis: Secondary | ICD-10-CM | POA: Diagnosis not present

## 2019-05-17 DIAGNOSIS — E1122 Type 2 diabetes mellitus with diabetic chronic kidney disease: Secondary | ICD-10-CM | POA: Diagnosis not present

## 2019-05-17 DIAGNOSIS — M797 Fibromyalgia: Secondary | ICD-10-CM | POA: Diagnosis not present

## 2019-05-17 DIAGNOSIS — N1831 Chronic kidney disease, stage 3a: Secondary | ICD-10-CM | POA: Diagnosis not present

## 2019-05-17 DIAGNOSIS — M1990 Unspecified osteoarthritis, unspecified site: Secondary | ICD-10-CM | POA: Diagnosis not present

## 2019-05-17 DIAGNOSIS — D509 Iron deficiency anemia, unspecified: Secondary | ICD-10-CM | POA: Diagnosis not present

## 2019-05-17 DIAGNOSIS — K219 Gastro-esophageal reflux disease without esophagitis: Secondary | ICD-10-CM | POA: Diagnosis not present

## 2019-05-17 DIAGNOSIS — M8580 Other specified disorders of bone density and structure, unspecified site: Secondary | ICD-10-CM | POA: Diagnosis not present

## 2019-05-17 DIAGNOSIS — J45909 Unspecified asthma, uncomplicated: Secondary | ICD-10-CM | POA: Diagnosis not present

## 2019-05-17 DIAGNOSIS — F411 Generalized anxiety disorder: Secondary | ICD-10-CM | POA: Diagnosis not present

## 2019-05-17 DIAGNOSIS — E875 Hyperkalemia: Secondary | ICD-10-CM | POA: Diagnosis not present

## 2019-05-17 DIAGNOSIS — I10 Essential (primary) hypertension: Secondary | ICD-10-CM | POA: Diagnosis not present

## 2019-05-17 DIAGNOSIS — J441 Chronic obstructive pulmonary disease with (acute) exacerbation: Secondary | ICD-10-CM | POA: Diagnosis not present

## 2019-05-17 NOTE — Progress Notes (Signed)
Office Visit Note  Patient: Glenda Terry             Date of Birth: 12/14/1940           MRN: [id number]             PCP: Celene Squibb, MD Referring: Celene Squibb, MD Visit Date: 05/24/2019 Occupation: @GUAROCC @  Subjective:  Arthritis (No improvement)   History of Present Illness: Glenda Terry is a 79 y.o. female with history of Oster arthritis, degenerative disc disease and scoliosis.  She states she was seen at the scoliosis center and had MRI of her lumbar spine.  She states she has discussed with her children and would not be interested in getting any surgery on her spine.  She continues to have discomfort in her joints especially her hands and knee joints and her feet.  She denies any joint swelling.  She states she has been followed by her pulmonologist for eosinophilic asthma.  Her symptoms have improved on the medications.  She denies any history of oral ulcers, nasal ulcers, malar rash or photosensitivity.  There is no history of Raynaud's phenomenon.  Activities of Daily Living:  Patient reports morning stiffness for 2 hours.   Patient Denies nocturnal pain.  Difficulty dressing/grooming: Denies Difficulty climbing stairs: Reports Difficulty getting out of chair: Reports Difficulty using hands for taps, buttons, cutlery, and/or writing: Reports  Review of Systems  Constitutional: Positive for fatigue. Negative for night sweats, weight gain and weight loss.  HENT: Positive for mouth dryness. Negative for mouth sores, trouble swallowing, trouble swallowing and nose dryness.   Eyes: Positive for dryness. Negative for pain, redness and visual disturbance.  Respiratory: Negative for cough, shortness of breath and difficulty breathing.   Cardiovascular: Negative for chest pain, palpitations, hypertension, irregular heartbeat and swelling in legs/feet.  Gastrointestinal: Positive for constipation. Negative for blood in stool and diarrhea.  Endocrine: Negative for  increased urination.  Genitourinary: Negative for difficulty urinating and vaginal dryness.  Musculoskeletal: Positive for arthralgias, gait problem, joint pain, muscle weakness and morning stiffness. Negative for joint swelling, myalgias, muscle tenderness and myalgias.  Skin: Negative for color change, rash, hair loss, skin tightness, ulcers and sensitivity to sunlight.  Allergic/Immunologic: Negative for susceptible to infections.  Neurological: Positive for weakness. Negative for dizziness, memory loss and night sweats.  Hematological: Positive for bruising/bleeding tendency. Negative for swollen glands.  Psychiatric/Behavioral: Positive for sleep disturbance. Negative for depressed mood. The patient is not nervous/anxious.     PMFS History:  Patient Active Problem List   Diagnosis Date Noted  . Left lower quadrant abdominal pain 02/16/2019  . Constipation 11/28/2017  . Hx of adenomatous colonic polyps 11/28/2017  . 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 Eosinophil count raised 06/09/2016  . Severe persistent asthma 06/07/2016  . COPD (chronic obstructive pulmonary disease) ([place]) 12/16/2015  . Asthma exacerbation 09/20/2015  . Acute respiratory failure ([place]) 09/20/2015  . CKD (chronic kidney disease) stage 3, GFR 30-59 ml/min 09/20/2015  . Hemorrhoids, internal 08/21/2015  . 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 Microcytic anemia 11/01/2012  . Family hx of colon cancer 11/01/2012  . 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 Herniated lumbar intervertebral disc 04/20/2012  . Difficulty in walking(719.7) 03/13/2012  . Stiffness of joint, not elsewhere classified, lower leg 03/13/2012  . Weakness of right leg 03/13/2012  . OA (osteoarthritis) of knee 01/24/2012  . Anxiety 02/09/2011  . HTN (hypertension) 02/09/2011  .  Arthritis 02/09/2011  . GERD (gastroesophageal reflux disease) 02/09/2011  . Rheumatoid arthritis ([place]) 02/09/2011      Past Medical History:  Diagnosis Date  . Anxiety   . Back pain, chronic   . CKD (chronic kidney disease) stage 3, GFR 30-59 ml/min   . Compression fx, thoracic spine (HCC)    T - 11  . COPD (chronic obstructive pulmonary disease) ([place]) 12/16/2015  . 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 Essential hypertension   . GERD (gastroesophageal reflux disease)   . HOH (hard of hearing)   . Inflammatory arthritis   . Iron deficiency anemia 01/2012  . Pulmonary eosinophilia   . Spinal stenosis   . Tubular adenoma 11/2012    Family History  Problem Relation Age of Onset  . Breast cancer Sister   . Colon cancer Sister        two deceased, one living and terminal, one current undergoing treatment, ages 61, 25, 27, 63  . Hypertension Mother   . Stroke Mother   . Heart attack Mother   . Hypertension Father   . Prostate cancer Father    Past Surgical History:  Procedure Laterality Date  . ABDOMINAL HYSTERECTOMY    . BACK SURGERY    . CARPAL TUNNEL RELEASE Right 4/14  . CATARACT EXTRACTION W/PHACO Left 11/19/2014   Procedure: CATARACT EXTRACTION PHACO AND INTRAOCULAR LENS PLACEMENT ([facility]);  Surgeon: Williams Che, MD;  Location: [facility];  Service: Ophthalmology;  Laterality: Left;  CDE: 4.77  . CATARACT EXTRACTION W/PHACO Right 02/03/2015   Procedure: CATARACT EXTRACTION PHACO AND INTRAOCULAR LENS PLACEMENT ([place]);  Surgeon: Williams Che, MD;  Location: [facility];  Service: Ophthalmology;  Laterality: Right;  CDE: 4.54  . COLONOSCOPY  06/19/2008   RMR: tortuous and elongated colon with scattered left-sided diverticula/colonic mucosa appeared entirely normal. Prior colonic ulcers had resolved.  . COLONOSCOPY  12/2007   Dr. Gala Romney: Scattered diffuse sigmoid diverticula, 2 areas of ulceration at the hepatic flexure. Biopsies unremarkable.  . COLONOSCOPY N/A 11/20/2012   next TCS 11/2017  . COLONOSCOPY WITH PROPOFOL N/A 12/22/2017   Procedure: COLONOSCOPY WITH PROPOFOL;  Surgeon: Daneil Dolin, MD;   Location[facility];  Service: Endoscopy;  Laterality: N/A;  7:30am  . DECOMPRESSIVE LUMBAR LAMINECTOMY LEVEL 2  04/20/2012   Procedure: DECOMPRESSIVE LUMBAR LAMINECTOMY LEVEL 2;  Surgeon: Tobi Bastos, MD;  Location: [facility];  Service: Orthopedics;  Laterality: Right;  Decompressive Lumbar Laminectomy of the L4 - L5 and L5 - S1 Complete/Laminectomy L5 on the Right (X-Ray)  . ESOPHAGOGASTRODUODENOSCOPY  12/2007   Dr. 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 Small hiatal hernia. Slightly pale duodenal mucosa (biopsy unremarkable)  . ESOPHAGOGASTRODUODENOSCOPY (EGD) WITH PROPOFOL N/A 01/20/2017   Dr. Gala Romney: Medium sized hiatal hernia empiric esophageal dilation for history of dysphagia  . EYE SURGERY     BIL CATARACTS  . IR KYPHO THORACIC WITH BONE BIOPSY  02/08/2017  . IR RADIOLOGIST EVAL & MGMT  02/02/2017  . JOINT REPLACEMENT    . KNEE ARTHROSCOPY Right   . MALONEY DILATION N/A 01/20/2017   Procedure: Venia Minks DILATION;  Surgeon: Daneil Dolin, MD;  [facility];  Service: Endoscopy;  Laterality: N/A;  . POLYPECTOMY  12/22/2017   Procedure: POLYPECTOMY;  Surgeon: Daneil Dolin, MD;  [facility];  Service: Endoscopy;;  . REVERSE SHOULDER ARTHROPLASTY Right 05/24/2014   Procedure: RIGHT SHOULDER REVERSE ARTHROPLASTY;  Surgeon: Netta Cedars, MD;  Location: [facility];  Service: Orthopedics;  Laterality: Right;  . REVERSE SHOULDER ARTHROPLASTY Left 06/10/2017   Procedure: LEFT REVERSE SHOULDER ARTHROPLASTY;  Surgeon: Netta Cedars, MD;  Location: [facility];  Service: Orthopedics;  Laterality: Left;  . TOTAL KNEE ARTHROPLASTY  01/24/2012   Procedure: TOTAL KNEE ARTHROPLASTY;  Surgeon: Gearlean Alf, MD;  Location: [facility];  Service: Orthopedics;  Laterality: Right;  . TUBAL LIGATION     Social History   Social History Narrative  . Not on file   Immunization History  Administered Date(s) Administered  .  Influenza-Unspecified 12/13/2013, 12/15/2015, 11/23/2016  . Pneumococcal Conjugate-13 12/18/2015  . Pneumococcal Polysaccharide-23 01/29/2016     Objective: Vital Signs: BP 105/68 (BP Location: Left Arm, Patient Position: Sitting, Cuff Size: Normal)   Pulse 84   Resp 20   Ht 4' 11"  (1.499 m)   Wt 202 lb 3.2 oz (91.7 kg)   BMI 40.84 kg/m    Physical Exam Vitals and nursing note reviewed.  Constitutional:      Appearance: She is well-developed.  HENT:     Head: Normocephalic and atraumatic.  Eyes:     Conjunctiva/sclera: Conjunctivae normal.  Cardiovascular:     Rate and Rhythm: Normal rate and regular rhythm.     Heart sounds: Normal heart sounds.  Pulmonary:     Effort: Pulmonary effort is normal.     Breath sounds: Normal breath sounds.  Abdominal:     General: Bowel sounds are normal.     Palpations: Abdomen is soft.  Musculoskeletal:     Cervical back: Normal range of motion.  Lymphadenopathy:     Cervical: No cervical adenopathy.  Skin:    General: Skin is warm and dry.     Capillary Refill: Capillary refill takes less than 2 seconds.  Neurological:     Mental Status: She is alert and oriented to person, place, and time.  Psychiatric:        Behavior: Behavior normal.      Musculoskeletal Exam: C-spine was in good range of motion.  Shoulder joints are replaced.  Her elbow joints were in good range of motion.  She has bilateral DIP and PIP thickening but no synovitis was noted.  Her right knee joint is replaced.  Left knee joint has discomfort range of motion.  She has some pedal edema but no synovitis of her ankles or MTPs.  CDAI Exam: CDAI Score: -- Patient Global: --; Provider Global: -- Swollen: --; Tender: -- Joint Exam 05/24/2019   No joint exam has been documented for this visit   There is currently no information documented on the homunculus. Go to the Rheumatology activity and complete the homunculus joint exam.  Investigation: No additional  findings.  Imaging: XR Foot 2 Views Left  Result Date: 04/25/2019 PIP and DIP narrowing was noted.  No MTP, intertarsal or tibiotalar joint space narrowing was noted. Impression: Mild osteoarthritic changes were noted in the foot.  XR Foot 2 Views Right  Result Date: 04/25/2019 PIP and DIP narrowing was noted.  No MTP, intertarsal or tibiotalar joint space narrowing was noted. Impression: Mild osteoarthritic changes were noted in the foot.  XR Hand 2 View Left  Result Date: 04/25/2019 No MCP, intercarpal radiocarpal joint space narrowing was noted.  No erosive changes were noted.  PIP and DIP narrowing was noted.  Subluxation of left third PIP and left first DIP joint was noted. Impression: These findings are consistent with osteoarthritis of the hand.  XR Hand 2  View Right  Result Date: 04/25/2019 No MCP, intercarpal radiocarpal joint space narrowing was noted.  No erosive changes were noted.  PIP and DIP narrowing was noted.  Some spurring of the right fourth distal phalanx was noted most likely due to a previous injury. Impression: These findings are consistent with osteoarthritis of the hand.  XR KNEE 3 VIEW LEFT  Result Date: 04/25/2019 Moderate medial compartment narrowing with medial and lateral osteophytes was noted.  Intercondylar osteophytes were noted.  No chondrocalcinosis was noted.  Moderate patellofemoral narrowing was noted. Impression: These findings are consistent with moderate osteoarthritis and moderate chondromalacia patella.   Recent Labs: Lab Results  Component Value Date   WBC 10.5 02/16/2019   HGB 11.0 (L) 02/16/2019   PLT 291 02/16/2019   NA 138 02/16/2019   K 4.2 02/16/2019   CL 104 02/16/2019   CO2 26 02/16/2019   GLUCOSE 93 02/16/2019   BUN 19 02/16/2019   CREATININE 1.33 (H) 02/16/2019   BILITOT 0.7 02/16/2019   ALKPHOS 46 02/16/2019   AST 41 02/16/2019   ALT 37 02/16/2019   PROT 7.0 02/16/2019   ALBUMIN 4.1 02/16/2019   CALCIUM 9.1 02/16/2019    GFRAA 45 (L) 02/16/2019  April 25, 2019 ANA 1: 320 speckled, ENA negative except positive RNP, C3-C4 normal, RF negative, anti-CCP negative, ESR 29, uric acid 6.1  Speciality Comments: No specialty comments available.  Procedures:  No procedures performed Allergies: Flexeril [cyclobenzaprine], Other, Keflex [cephalexin], Aspirin, Statins, Adhesive [tape], and Chlorhexidine   Assessment / Plan:     Visit Diagnoses: Positive ANA (antinuclear antibody) - ANA 1: 320 speckled, RNP positive.  Patient has no clinical features of autoimmune disease on examination.  It is not unusual to see positive antibodies with aging process.  Have advised her to contact me if she develops any new symptoms.  I will be happy to reevaluate her in 1 year.  S/p bilateral shoulder joint replacement-doing well.  She does not have much discomfort.  Primary osteoarthritis of both hands-joint protection muscle strengthening was discussed.  Primary osteoarthritis of left knee - moderate osteoarthritis and moderate chondromalacia patella.  She continues to have discomfort.  She mobilizes with the help of a cane.  Status post total right knee replacement-chronic pain.  Primary osteoarthritis of both feet-proper fitting shoes were discussed.  Spinal stenosis of lumbar region without neurogenic claudication-patient has been evaluated at [facility].  History of vertebral compression fracture  Osteopenia of multiple sites-followed by her PCP.  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  Severe persistent asthma, unspecified whether complicated  History of COPD  Essential hypertension  History of gastroesophageal reflux (GERD)  Hx of adenomatous colonic polyps  Orders: No orders of the defined types were placed in this encounter.  No orders of the defined types were placed in this  encounter.     Follow-Up Instructions: Return in about 1 year (around 05/23/2020) for Osteoarthritis, positive ANA, positive RNP.   Bo Merino, MD  Note - This record has been created using Editor, commissioning.  Chart creation errors have been sought, but may not always  have been located. Such creation errors do not reflect on  the standard of medical care.

## 2019-05-18 ENCOUNTER — Ambulatory Visit (HOSPITAL_COMMUNITY): Payer: PPO

## 2019-05-21 ENCOUNTER — Ambulatory Visit (HOSPITAL_COMMUNITY): Payer: PPO

## 2019-05-24 ENCOUNTER — Other Ambulatory Visit: Payer: Self-pay

## 2019-05-24 ENCOUNTER — Ambulatory Visit: Payer: PPO | Admitting: Rheumatology

## 2019-05-24 ENCOUNTER — Encounter: Payer: Self-pay | Admitting: Rheumatology

## 2019-05-24 VITALS — BP 105/68 | HR 84 | Resp 20 | Ht 59.0 in | Wt 202.2 lb

## 2019-05-24 DIAGNOSIS — M1712 Unilateral primary osteoarthritis, left knee: Secondary | ICD-10-CM | POA: Diagnosis not present

## 2019-05-24 DIAGNOSIS — Z86010 Personal history of colonic polyps: Secondary | ICD-10-CM

## 2019-05-24 DIAGNOSIS — J4550 Severe persistent asthma, uncomplicated: Secondary | ICD-10-CM | POA: Diagnosis not present

## 2019-05-24 DIAGNOSIS — E249 Cushing's syndrome, unspecified: Secondary | ICD-10-CM

## 2019-05-24 DIAGNOSIS — M8589 Other specified disorders of bone density and structure, multiple sites: Secondary | ICD-10-CM | POA: Diagnosis not present

## 2019-05-24 DIAGNOSIS — M19041 Primary osteoarthritis, right hand: Secondary | ICD-10-CM

## 2019-05-24 DIAGNOSIS — Z8781 Personal history of (healed) traumatic fracture: Secondary | ICD-10-CM

## 2019-05-24 DIAGNOSIS — M19072 Primary osteoarthritis, left ankle and foot: Secondary | ICD-10-CM

## 2019-05-24 DIAGNOSIS — Z96651 Presence of right artificial knee joint: Secondary | ICD-10-CM | POA: Diagnosis not present

## 2019-05-24 DIAGNOSIS — R7689 Other specified abnormal immunological findings in serum: Secondary | ICD-10-CM

## 2019-05-24 DIAGNOSIS — Z8709 Personal history of other diseases of the respiratory system: Secondary | ICD-10-CM

## 2019-05-24 DIAGNOSIS — Z8719 Personal history of other diseases of the digestive system: Secondary | ICD-10-CM

## 2019-05-24 DIAGNOSIS — M48061 Spinal stenosis, lumbar region without neurogenic claudication: Secondary | ICD-10-CM | POA: Diagnosis not present

## 2019-05-24 DIAGNOSIS — M19071 Primary osteoarthritis, right ankle and foot: Secondary | ICD-10-CM | POA: Diagnosis not present

## 2019-05-24 DIAGNOSIS — I10 Essential (primary) hypertension: Secondary | ICD-10-CM

## 2019-05-24 DIAGNOSIS — J8289 Other pulmonary eosinophilia, not elsewhere classified: Secondary | ICD-10-CM | POA: Diagnosis not present

## 2019-05-24 DIAGNOSIS — Z96611 Presence of right artificial shoulder joint: Secondary | ICD-10-CM | POA: Diagnosis not present

## 2019-05-24 DIAGNOSIS — R768 Other specified abnormal immunological findings in serum: Secondary | ICD-10-CM

## 2019-05-24 DIAGNOSIS — Z96612 Presence of left artificial shoulder joint: Secondary | ICD-10-CM

## 2019-05-24 DIAGNOSIS — Z860101 Personal history of adenomatous and serrated colon polyps: Secondary | ICD-10-CM

## 2019-05-24 DIAGNOSIS — M19042 Primary osteoarthritis, left hand: Secondary | ICD-10-CM

## 2019-05-28 DIAGNOSIS — M545 Low back pain: Secondary | ICD-10-CM | POA: Diagnosis not present

## 2019-05-28 DIAGNOSIS — M4807 Spinal stenosis, lumbosacral region: Secondary | ICD-10-CM | POA: Diagnosis not present

## 2019-05-28 DIAGNOSIS — M5126 Other intervertebral disc displacement, lumbar region: Secondary | ICD-10-CM | POA: Diagnosis not present

## 2019-05-28 DIAGNOSIS — M5127 Other intervertebral disc displacement, lumbosacral region: Secondary | ICD-10-CM | POA: Diagnosis not present

## 2019-05-28 DIAGNOSIS — M5136 Other intervertebral disc degeneration, lumbar region: Secondary | ICD-10-CM | POA: Diagnosis not present

## 2019-05-28 DIAGNOSIS — M47816 Spondylosis without myelopathy or radiculopathy, lumbar region: Secondary | ICD-10-CM | POA: Diagnosis not present

## 2019-05-30 DIAGNOSIS — G720 Drug-induced myopathy: Secondary | ICD-10-CM | POA: Diagnosis not present

## 2019-05-30 DIAGNOSIS — M48061 Spinal stenosis, lumbar region without neurogenic claudication: Secondary | ICD-10-CM | POA: Diagnosis not present

## 2019-05-30 DIAGNOSIS — R451 Restlessness and agitation: Secondary | ICD-10-CM | POA: Diagnosis not present

## 2019-05-30 DIAGNOSIS — J441 Chronic obstructive pulmonary disease with (acute) exacerbation: Secondary | ICD-10-CM | POA: Diagnosis not present

## 2019-05-30 DIAGNOSIS — N183 Chronic kidney disease, stage 3 unspecified: Secondary | ICD-10-CM | POA: Diagnosis not present

## 2019-05-31 DIAGNOSIS — M4150 Other secondary scoliosis, site unspecified: Secondary | ICD-10-CM | POA: Diagnosis not present

## 2019-05-31 DIAGNOSIS — M4716 Other spondylosis with myelopathy, lumbar region: Secondary | ICD-10-CM | POA: Diagnosis not present

## 2019-05-31 DIAGNOSIS — M5106 Intervertebral disc disorders with myelopathy, lumbar region: Secondary | ICD-10-CM | POA: Diagnosis not present

## 2019-05-31 DIAGNOSIS — M4316 Spondylolisthesis, lumbar region: Secondary | ICD-10-CM | POA: Diagnosis not present

## 2019-06-08 DIAGNOSIS — Z79899 Other long term (current) drug therapy: Secondary | ICD-10-CM | POA: Diagnosis not present

## 2019-06-08 DIAGNOSIS — M961 Postlaminectomy syndrome, not elsewhere classified: Secondary | ICD-10-CM | POA: Diagnosis not present

## 2019-06-08 DIAGNOSIS — M48061 Spinal stenosis, lumbar region without neurogenic claudication: Secondary | ICD-10-CM | POA: Diagnosis not present

## 2019-06-08 DIAGNOSIS — Z5181 Encounter for therapeutic drug level monitoring: Secondary | ICD-10-CM | POA: Diagnosis not present

## 2019-06-09 ENCOUNTER — Emergency Department (HOSPITAL_COMMUNITY): Payer: PPO

## 2019-06-09 ENCOUNTER — Emergency Department (HOSPITAL_COMMUNITY)
Admission: EM | Admit: 2019-06-09 | Discharge: 2019-06-09 | Disposition: A | Discharge status: Home / Self Care | Payer: PPO | Attending: Emergency Medicine | Admitting: Emergency Medicine

## 2019-06-09 ENCOUNTER — Other Ambulatory Visit: Payer: Self-pay

## 2019-06-09 ENCOUNTER — Encounter (HOSPITAL_COMMUNITY): Payer: Self-pay | Admitting: Emergency Medicine

## 2019-06-09 DIAGNOSIS — R609 Edema, unspecified: Secondary | ICD-10-CM | POA: Diagnosis not present

## 2019-06-09 DIAGNOSIS — J449 Chronic obstructive pulmonary disease, unspecified: Secondary | ICD-10-CM | POA: Diagnosis not present

## 2019-06-09 DIAGNOSIS — R2243 Localized swelling, mass and lump, lower limb, bilateral: Secondary | ICD-10-CM | POA: Diagnosis present

## 2019-06-09 DIAGNOSIS — R0602 Shortness of breath: Secondary | ICD-10-CM | POA: Diagnosis not present

## 2019-06-09 DIAGNOSIS — I129 Hypertensive chronic kidney disease with stage 1 through stage 4 chronic kidney disease, or unspecified chronic kidney disease: Secondary | ICD-10-CM | POA: Diagnosis not present

## 2019-06-09 DIAGNOSIS — Z79899 Other long term (current) drug therapy: Secondary | ICD-10-CM | POA: Insufficient documentation

## 2019-06-09 DIAGNOSIS — Z87891 Personal history of nicotine dependence: Secondary | ICD-10-CM | POA: Diagnosis not present

## 2019-06-09 DIAGNOSIS — N183 Chronic kidney disease, stage 3 unspecified: Secondary | ICD-10-CM | POA: Diagnosis not present

## 2019-06-09 DIAGNOSIS — R600 Localized edema: Secondary | ICD-10-CM | POA: Diagnosis not present

## 2019-06-09 DIAGNOSIS — R062 Wheezing: Secondary | ICD-10-CM | POA: Diagnosis not present

## 2019-06-09 LAB — BASIC METABOLIC PANEL
Anion gap: 10 (ref 5–15)
BUN: 35 mg/dL — ABNORMAL HIGH (ref 8–23)
CO2: 24 mmol/L (ref 22–32)
Calcium: 9 mg/dL (ref 8.9–10.3)
Chloride: 100 mmol/L (ref 98–111)
Creatinine, Ser: 1.82 mg/dL — ABNORMAL HIGH (ref 0.44–1.00)
GFR calc Af Amer: 31 mL/min — ABNORMAL LOW (ref >60)
GFR calc non Af Amer: 27 mL/min — ABNORMAL LOW (ref >60)
Glucose, Bld: 92 mg/dL (ref 70–99)
Potassium: 4 mmol/L (ref 3.5–5.1)
Sodium: 134 mmol/L — ABNORMAL LOW (ref 135–145)

## 2019-06-09 LAB — CBC WITH DIFFERENTIAL/PLATELET
Abs Immature Granulocytes: 0.03 10*3/uL (ref 0.00–0.07)
Basophils Absolute: 0 10*3/uL (ref 0.0–0.1)
Basophils Relative: 0 %
Eosinophils Absolute: 0 10*3/uL (ref 0.0–0.5)
Eosinophils Relative: 0 %
HCT: 33.9 % — ABNORMAL LOW (ref 36.0–46.0)
Hemoglobin: 10.3 g/dL — ABNORMAL LOW (ref 12.0–15.0)
Immature Granulocytes: 0 %
Lymphocytes Relative: 32 %
Lymphs Abs: 2.8 10*3/uL (ref 0.7–4.0)
MCH: 24.8 pg — ABNORMAL LOW (ref 26.0–34.0)
MCHC: 30.4 g/dL (ref 30.0–36.0)
MCV: 81.5 fL (ref 80.0–100.0)
Monocytes Absolute: 0.7 10*3/uL (ref 0.1–1.0)
Monocytes Relative: 8 %
Neutro Abs: 5.3 10*3/uL (ref 1.7–7.7)
Neutrophils Relative %: 60 %
Platelets: 273 10*3/uL (ref 150–400)
RBC: 4.16 MIL/uL (ref 3.87–5.11)
RDW: 14.9 % (ref 11.5–15.5)
WBC: 8.7 10*3/uL (ref 4.0–10.5)
nRBC: 0 % (ref 0.0–0.2)

## 2019-06-09 LAB — URINALYSIS, ROUTINE W REFLEX MICROSCOPIC
Bilirubin Urine: NEGATIVE
Glucose, UA: NEGATIVE mg/dL
Hgb urine dipstick: NEGATIVE
Ketones, ur: NEGATIVE mg/dL
Leukocytes,Ua: NEGATIVE
Nitrite: NEGATIVE
Protein, ur: NEGATIVE mg/dL
Specific Gravity, Urine: 1.005 (ref 1.005–1.030)
pH: 5 (ref 5.0–8.0)

## 2019-06-09 LAB — BRAIN NATRIURETIC PEPTIDE: B Natriuretic Peptide: 36 pg/mL (ref 0.0–100.0)

## 2019-06-09 NOTE — ED Provider Notes (Signed)
Pt was seen at PCP office - has hx of eosinophilic asthma and htn, CKD 3, office a week ago and then again yesterday - was 9 pounds up in weight - noted swelling - daughter states some urinary difficulty  - not passing strong urine - went PCP and sent here - no hx of CHF, has trace edema - lungs clear.  No abd ttp.  R/o urinary outlet obstruction - CHF, UTI, liver failure Well appearing with normal VS. Pt states she feels well - doesn't know wny she was sent Reassurance given that she appears well Slight increase in Cr, pt to f/u closely - already on torsemide  Medical screening examination/treatment/procedure(s) were conducted as a shared visit with non-physician practitioner(s) and myself.  I personally evaluated the patient during the encounter.  Clinical Impression:   Final diagnoses:  Peripheral edema         Glenda Chapel, MD 06/10/19 [phone]

## 2019-06-09 NOTE — Discharge Instructions (Addendum)
You were seen in the ER for swelling in your legs and weight gain.  Your lab test does not show any signs of heart failure or fluid overload.  Your kidney function is a little worse compared to your last lab test. We recommend holding your torsemide for a few days and drinking at least 6-8 bottles of water a day to help improve your kidney function.  Chest x-ray was normal.  Your urine culture is still pending and will be back in a few days.  You can check this via MyChart.  If it comes back with bacterial growth follow-up with your primary care provider for possible antibiotics.  Follow-up in 3 to 4 days with your primary care provider to recheck your labs to assess your kidney function.  Return to the ER if your symptoms worsen.

## 2019-06-09 NOTE — ED Provider Notes (Signed)
[facility] EMERGENCY DEPARTMENT Provider Note   CSN: [id number] Arrival date & time: 06/09/19  1120     History Chief Complaint  Patient presents with  . Leg Swelling    Glenda Terry is a 79 y.o. female.  HPI 79 year old well-appearing female with a history of CKD stage III, HTN, GERD, pulmonary eosinophilia, COPD presents to the ER after her PCP referred her noting a 9 pound weight gain over the last week and swelling in her legs bilaterally.  History provided by patient and daughter at bedside she does not have a history of CHF.  Her daughter has also noted that she has been having difficulty urinating, stating she only has a trickle when she goes.  She is taking torsemide.  She denies any fevers, chest pain, syncope, dizziness abdominal pain, shortness of breath, n/v, diarrhea, dysuria, hematuria, hematochezia.    Past Medical History:  Diagnosis Date  . Anxiety   . Back pain, chronic   . CKD (chronic kidney disease) stage 3, GFR 30-59 ml/min   . Compression fx, thoracic spine (HCC)    T - 11  . COPD (chronic obstructive pulmonary disease) ([place]) 12/16/2015  . 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 Essential hypertension   . GERD (gastroesophageal reflux disease)   . HOH (hard of hearing)   . Inflammatory arthritis   . Iron deficiency anemia 01/2012  . Pulmonary eosinophilia   . Spinal stenosis   . Tubular adenoma 11/2012    Patient Active Problem List   Diagnosis Date Noted  . Left lower quadrant abdominal pain 02/16/2019  . Constipation 11/28/2017  . Hx of adenomatous colonic polyps 11/28/2017  . 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 Eosinophil count raised 06/09/2016  . Severe persistent asthma 06/07/2016  . COPD (chronic obstructive pulmonary disease) ([place]) 12/16/2015  . Asthma exacerbation 09/20/2015  . Acute respiratory failure ([place]) 09/20/2015  . CKD (chronic kidney disease) stage 3, GFR 30-59 ml/min 09/20/2015  . Hemorrhoids, internal 08/21/2015  . S/P  shoulder replacement 05/24/2014  . Microcytic anemia 11/01/2012  . Family hx of colon cancer 11/01/2012  . 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 Herniated lumbar intervertebral disc 04/20/2012  . Difficulty in walking(719.7) 03/13/2012  . Stiffness of joint, not elsewhere classified, lower leg 03/13/2012  . Weakness of right leg 03/13/2012  . OA (osteoarthritis) of knee 01/24/2012  . Anxiety 02/09/2011  . HTN (hypertension) 02/09/2011  . Arthritis 02/09/2011  . GERD (gastroesophageal reflux disease) 02/09/2011  . Rheumatoid arthritis ([place]) 02/09/2011    Past Surgical History:  Procedure Laterality Date  . ABDOMINAL HYSTERECTOMY    . BACK SURGERY    . CARPAL TUNNEL RELEASE Right 4/14  . CATARACT EXTRACTION W/PHACO Left 11/19/2014   Procedure: CATARACT EXTRACTION PHACO AND INTRAOCULAR LENS PLACEMENT (I[facility]);  Surgeon: Williams Che, MD;  Location: [facility];  Service: Ophthalmology;  Laterality: Left;  CDE: 4.77  . CATARACT EXTRACTION W/PHACO Right 02/03/2015   Procedure: CATARACT EXTRACTION PHACO AND INTRAOCULAR LENS PLACEMENT ([place]);  Surgeon: Williams Che, MD;  Location: [facility];  Service: Ophthalmology;  Laterality: Right;  CDE: 4.54  . COLONOSCOPY  06/19/2008   RMR: tortuous and elongated colon with scattered left-sided diverticula/colonic mucosa appeared entirely normal. Prior colonic ulcers had resolved.  . COLONOSCOPY  12/2007   Dr. Gala Romney: Scattered diffuse sigmoid diverticula, 2 areas of ulceration at the hepatic flexure. Biopsies unremarkable.  . COLONOSCOPY N/A 11/20/2012   next TCS 11/2017  . COLONOSCOPY  WITH PROPOFOL N/A 12/22/2017   Procedure: COLONOSCOPY WITH PROPOFOL;  Surgeon: Daneil Dolin, MD;  Location: [facility];  Service: Endoscopy;  Laterality: N/A;  7:30am  . DECOMPRESSIVE LUMBAR LAMINECTOMY LEVEL 2  04/20/2012   Procedure: DECOMPRESSIVE LUMBAR LAMINECTOMY LEVEL 2;  Surgeon: Tobi Bastos, MD;  Location: [facility];   Service: Orthopedics;  Laterality: Right;  Decompressive Lumbar Laminectomy of the L4 - L5 and L5 - S1 Complete/Laminectomy L5 on the Right (X-Ray)  . ESOPHAGOGASTRODUODENOSCOPY  12/2007   Dr. 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 Small hiatal hernia. Slightly pale duodenal mucosa (biopsy unremarkable)  . ESOPHAGOGASTRODUODENOSCOPY (EGD) WITH PROPOFOL N/A 01/20/2017   Dr. Gala Romney: Medium sized hiatal hernia empiric esophageal dilation for history of dysphagia  . EYE SURGERY     BIL CATARACTS  . IR KYPHO THORACIC WITH BONE BIOPSY  02/08/2017  . IR RADIOLOGIST EVAL & MGMT  02/02/2017  . JOINT REPLACEMENT    . KNEE ARTHROSCOPY Right   . MALONEY DILATION N/A 01/20/2017   Procedure: Venia Minks DILATION;  Surgeon: Daneil Dolin, MD;  [facility];  Service: Endoscopy;  Laterality: N/A;  . POLYPECTOMY  12/22/2017   Procedure: POLYPECTOMY;  Surgeon: Daneil Dolin, MD;  Location: [facility];  Service: Endoscopy;;  . REVERSE SHOULDER ARTHROPLASTY Right 05/24/2014   Procedure: RIGHT SHOULDER REVERSE ARTHROPLASTY;  Surgeon: Netta Cedars, MD;  Location: [place];  Service: Orthopedics;  Laterality: Right;  . REVERSE SHOULDER ARTHROPLASTY Left 06/10/2017   Procedure: LEFT REVERSE SHOULDER ARTHROPLASTY;  Surgeon: Netta Cedars, MD;  Location: [place];  Service: Orthopedics;  Laterality: Left;  . TOTAL KNEE ARTHROPLASTY  01/24/2012   Procedure: TOTAL KNEE ARTHROPLASTY;  Surgeon: Gearlean Alf, MD;  Location: [facility];  Service: Orthopedics;  Laterality: Right;  . TUBAL LIGATION       OB History    Gravida  3   Para  3   Term  2   Preterm  1   AB      Living  3     SAB      TAB      Ectopic      Multiple      Live Births              Family History  Problem Relation Age of Onset  . Breast cancer Sister   . Colon cancer Sister        two deceased, one living and terminal, one current undergoing treatment, ages 60, 60, 37,  64  . Hypertension Mother   . Stroke Mother   . Heart attack Mother   . Hypertension Father   . Prostate cancer Father     Social History   Tobacco Use  . Smoking status: Former Smoker    Packs/day: 2.00    Years: 40.00    Pack years: 80.00    Types: Cigarettes    Quit date: 01/19/1992    Years since quitting: 27.4  . Smokeless tobacco: Never Used  Substance Use Topics  . Alcohol use: No  . Drug use: No    Home Medications Prior to Admission medications   Medication Sig Start Date End Date Taking? Authorizing Provider  albuterol (PROAIR HFA) 108 (90 BASE) MCG/ACT inhaler Inhale 2 puffs into the lungs every 6 (six) hours as needed for wheezing or shortness of breath. For COPD    [provider]  ALPRAZolam (XANAX) 0.5 MG tablet Take 0.25-0.5  mg by mouth 2 (two) times daily as needed for anxiety.     [provider]  Ascorbic Acid (VITAMIN C PO) Take 500 mg by mouth daily.     [provider]  Benralizumab (FASENRA) 30 MG/ML SOSY Inject 30 mg into the skin every 8 (eight) weeks.     [provider]  denosumab (PROLIA) 60 MG/ML SOSY injection Inject 60 mg into the skin every 6 (six) months. 03/01/18   [provider]  DULERA 200-5 MCG/ACT AERO Inhale 2 puffs into the lungs 2 (two) times daily. 05/07/19   [provider]  EPINEPHrine (EPIPEN 2-PAK) 0.3 mg/0.3 mL IJ SOAJ injection Inject 0.3 mg into the muscle once.     [provider]  esomeprazole (NEXIUM) 40 MG capsule TAKE 1 CAPSULE BY MOUTH TWICE DAILY. 08/31/18   Mahala Menghini, PA-C  famotidine (PEPCID) 10 MG tablet Take 10 mg by mouth at bedtime.    [provider]  ferrous sulfate 325 (65 FE) MG tablet Take 325 mg by mouth daily with breakfast.    [provider]  fexofenadine (ALLEGRA) 180 MG tablet Take 180 mg by mouth daily.    [provider]  hydrocortisone 2.5 % cream hydrocortisone 2.5 % topical cream 04/18/17   [provider]  ipratropium-albuterol (DUONEB) 0.5-2.5 (3) MG/3ML SOLN Take 3 mLs by nebulization 2 (two) times daily.     [provider]  ketotifen (ZADITOR) 0.025 % ophthalmic solution Place 1 drop into both eyes 2 (two) times daily.    [provider]  LINZESS 290 MCG CAPS capsule TAKE 1 CAPSULE BY MOUTH DAILY BEFORE BREAKFAST Patient taking differently: Take 290 mcg by mouth daily before breakfast.  08/24/18   Annitta Needs, NP  Magnesium 250 MG TABS Take 250 mg by mouth daily.     [provider]  methocarbamol (ROBAXIN) 750 MG tablet Take 750 mg by mouth at bedtime as needed. 05/07/19   [provider]  mometasone-formoterol (DULERA) 100-5 MCG/ACT AERO Inhale 2 puffs into the lungs 2 (two) times daily.    [provider]  montelukast (SINGULAIR) 10 MG tablet Take 10 mg by mouth daily.  04/12/15   [provider]  mupirocin ointment (BACTROBAN) 2 % mupirocin 2 % topical ointment 02/02/19   [provider]  Nebulizers (DEVILBISS TRAVELER NEBULIZER) MISC With tubing and supplies to be used with nebulized medications. Pt's nebulizer is over 49 years old and it is broken.  J45.50 Severe persistent asthma, J44.9 COPD 07/11/17   [provider]  olmesartan (BENICAR) 40 MG tablet Take 40 mg by mouth daily.    [provider]  oxyCODONE-acetaminophen (PERCOCET) 10-325 MG tablet Take 1 tablet by mouth 2 (two) times daily. 02/02/19   [provider]  Polyethyl Glycol-Propyl Glycol (SYSTANE OP) Place 1 drop into both eyes 2 (two) times daily.    [provider]  polyethylene glycol (MIRALAX / GLYCOLAX) packet Take 17 g by mouth as needed.     [provider]  potassium chloride (K-DUR) 10 MEQ tablet Take 10 mEq by mouth every other day.  11/16/16   [provider]  pregabalin (LYRICA) 75 MG capsule Take 75 mg by mouth daily.     [provider]  torsemide (DEMADEX) 10 MG tablet Take 10 mg by mouth  every other day.  11/12/16   [provider]  VITAMIN D PO Take 2,000 mg by mouth daily.     [provider]    Allergies    Flexeril [cyclobenzaprine], Other, Keflex [cephalexin], Aspirin, Statins, Adhesive [tape], and Chlorhexidine  Review of Systems   Review of Systems  Constitutional: Negative for chills, fatigue and fever.  Respiratory: Negative for cough, chest tightness, shortness of breath and wheezing.   Cardiovascular: Positive for leg swelling. Negative for chest pain and palpitations.  Gastrointestinal: Negative for abdominal pain, constipation, diarrhea, nausea and vomiting.  Genitourinary: Positive for decreased urine volume and difficulty urinating. Negative for dyspareunia, dysuria, flank pain, hematuria and pelvic pain.  Musculoskeletal: Positive for back pain and joint swelling. Negative for arthralgias.  Skin: Negative for color change and rash.  Neurological: Negative for dizziness, seizures, syncope, light-headedness and headaches.  Psychiatric/Behavioral: Negative for agitation and confusion.  All other systems reviewed and are negative.   Physical Exam Updated Vital Signs BP 132/72   Pulse 74   Temp 98.5 F (36.9 C) (Oral)   Resp 17   Ht 4\' 11"  (1.499 m)   Wt 91.2 kg   SpO2 96%   BMI 40.60 kg/m   Physical Exam Vitals reviewed.  Constitutional:      General: She is not in acute distress.    Appearance: She is obese. She is not ill-appearing.  HENT:     Mouth/Throat:     Mouth: Mucous membranes are moist.     Pharynx: Oropharynx is clear.  Eyes:     Extraocular Movements: Extraocular movements intact.     Pupils: Pupils are equal, round, and reactive to light.  Neck:     Comments: No JVD Cardiovascular:     Rate and Rhythm: Normal rate and regular rhythm.  Pulmonary:     Effort: Pulmonary effort is normal.     Breath sounds: Normal breath sounds.  Abdominal:     General: Abdomen is flat.     Palpations: Abdomen is soft.    Musculoskeletal:        General: Swelling present.     Cervical back: Normal range of motion and neck supple.     Comments: Mild swelling to lateral malleolus bilaterally with trace edema   Skin:    Capillary Refill: Capillary refill takes 2 to 3 seconds.  Neurological:     General: No focal deficit present.     Mental Status: She is alert and oriented to person, place, and time.  Psychiatric:        Mood and Affect: Mood normal.        Behavior: Behavior normal.     ED Results / Procedures / Treatments   Labs (all labs ordered are listed, but only abnormal results are displayed) Labs Reviewed  BASIC METABOLIC PANEL - Abnormal; Notable for the following components:      Result Value   Sodium 134 (*)    BUN 35 (*)    Creatinine, Ser 1.82 (*)    GFR calc non Af Amer 27 (*)    GFR calc Af Amer 31 (*)    All other components within normal limits  URINALYSIS, ROUTINE W REFLEX MICROSCOPIC - Abnormal; Notable for the following components:   Color, Urine STRAW (*)    All other components within normal limits  CBC WITH DIFFERENTIAL/PLATELET - Abnormal; Notable for the following components:   Hemoglobin 10.3 (*)    HCT 33.9 (*)    MCH 24.8 (*)    All other components within normal limits  URINE CULTURE  BRAIN NATRIURETIC PEPTIDE    EKG EKG Interpretation  Date/Time:  Saturday June 09 2019 11:57:06 EDT Ventricular Rate:  79 PR Interval:    QRS Duration: 95 QT Interval:  387 QTC Calculation: 444 R Axis:   1 Text Interpretation: Sinus rhythm Ventricular premature complex Aberrant conduction of SV complex(es) Low voltage, precordial leads Since last tracing voltage lower Confirmed by Noemi Chapel [phone]) on 06/09/2019 12:53:40 PM   Radiology DG Chest Portable 1 View  Result Date: 06/09/2019 CLINICAL DATA:  Patient sent to ED from Dr [name] Crane for weight gain of 9lbs in past week and swelling of lower extremities bilateral. Per daughter patient had notable decrease in urine  output. Patient does report some increase in shortness of breath EXAM: PORTABLE CHEST - 1 VIEW COMPARISON:  10/11/2018 FINDINGS: Lungs are clear. Heart size and mediastinal contours are within normal limits. No effusion. No pneumothorax. Bilateral shoulder arthroplasty hardware partially visualized. Changes of cement vertebral augmentation near the thoracolumbar junction as before. IMPRESSION: No acute cardiopulmonary disease. Electronically Signed   By: Lucrezia Europe M.D.   On: 06/09/2019 13:10    Procedures Procedures (including critical care time)  Medications Ordered in ED Medications - No data to display  ED Course  I have reviewed the triage vital signs and the nursing notes.  Pertinent labs & imaging results that were available during my care of the patient were reviewed by me and considered in my medical decision making (see chart for details).    MDM Rules/Calculators/A&P                     79 year old female presents to the ER after a 9 pound weight gain over the last week and swelling to her legs per PCP. Patient is well-appearing, in no acute distress with nonconcerning vitals on presentation to the ER.  Trace edema noticed in lower legs bilaterally near the lateral malleolus, lungs are clear to auscultation, abdomen soft and nontender.  No evidence of fluid overload per physical exam.  Will order basic labs, urinalysis and culture, bladder scan to rule out acute onset of heart failure/UTI.  Anticipate patient will be stable for discharge pending normal labs.  BMP showed increased creatinine 1.82 which is an increase from her previous 1.33 3 months ago.  BUN also elevated, could be suggestive of dehydration.  Suggested patient hold torsemide for several days and increase water consumption to 68 water bottles per day.  Follow-up with PCP in 3 to 4 days to recheck labs.  UA negative.  EKG sinus rhythm.  BNP normal.  Chest x-ray negative.  Urine culture pending.  Explained to the patient  that if it comes back positive with bacteria and a few days she needs to follow-up with her PCP for possible antibiotic treatment.  Doubt new onset heart failure given these results.  Patient is comfortable, in no acute distress, requesting to go home as she has no complaints at this time.  She has been able to urinate several times while in the ER.  At this point she is stable for discharge.  Encouraged prompt follow-up with PCP, return precautions given.  Patient was seen and evaluated by Dr. Sabra Heck, he is agreeable to the plan above.   Final Clinical Impression(s) / ED Diagnoses Final diagnoses:  Peripheral edema    Rx / DC Orders ED Discharge Orders    None       Lyndel Safe 06/09/19 1501    Noemi Chapel, MD 06/10/19 [phone]

## 2019-06-09 NOTE — ED Triage Notes (Signed)
Patient sent to ED from Dr [name] Crane for weight gain of 9lbs in past week and swelling of lower extremities bilateral. Per daughter patient had notable decrease in urine output. Patient does report some increase in shortness of breath from her normal asthma. Denies any chest pain. Denies any hx of CHF. Patient does have stage 3 kidney disease.

## 2019-06-11 LAB — URINE CULTURE: Culture: <10000 — AB

## 2019-06-14 DIAGNOSIS — R600 Localized edema: Secondary | ICD-10-CM | POA: Diagnosis not present

## 2019-06-14 DIAGNOSIS — N183 Chronic kidney disease, stage 3 unspecified: Secondary | ICD-10-CM | POA: Diagnosis not present

## 2019-06-22 DIAGNOSIS — D7219 Other eosinophilia: Secondary | ICD-10-CM | POA: Diagnosis not present

## 2019-06-22 DIAGNOSIS — J4550 Severe persistent asthma, uncomplicated: Secondary | ICD-10-CM | POA: Diagnosis not present

## 2019-06-22 DIAGNOSIS — Z79899 Other long term (current) drug therapy: Secondary | ICD-10-CM | POA: Diagnosis not present

## 2019-07-02 DIAGNOSIS — E6609 Other obesity due to excess calories: Secondary | ICD-10-CM | POA: Diagnosis not present

## 2019-07-02 DIAGNOSIS — M797 Fibromyalgia: Secondary | ICD-10-CM | POA: Diagnosis not present

## 2019-07-02 DIAGNOSIS — R600 Localized edema: Secondary | ICD-10-CM | POA: Diagnosis not present

## 2019-07-02 DIAGNOSIS — I10 Essential (primary) hypertension: Secondary | ICD-10-CM | POA: Diagnosis not present

## 2019-07-02 DIAGNOSIS — F419 Anxiety disorder, unspecified: Secondary | ICD-10-CM | POA: Diagnosis not present

## 2019-07-02 DIAGNOSIS — G720 Drug-induced myopathy: Secondary | ICD-10-CM | POA: Diagnosis not present

## 2019-07-02 DIAGNOSIS — F411 Generalized anxiety disorder: Secondary | ICD-10-CM | POA: Diagnosis not present

## 2019-07-02 DIAGNOSIS — E782 Mixed hyperlipidemia: Secondary | ICD-10-CM | POA: Diagnosis not present

## 2019-07-02 DIAGNOSIS — D509 Iron deficiency anemia, unspecified: Secondary | ICD-10-CM | POA: Diagnosis not present

## 2019-07-02 DIAGNOSIS — Z6838 Body mass index (BMI) 38.0-38.9, adult: Secondary | ICD-10-CM | POA: Diagnosis not present

## 2019-07-02 DIAGNOSIS — J449 Chronic obstructive pulmonary disease, unspecified: Secondary | ICD-10-CM | POA: Diagnosis not present

## 2019-07-02 DIAGNOSIS — M130 Polyarthritis, unspecified: Secondary | ICD-10-CM | POA: Diagnosis not present

## 2019-07-02 DIAGNOSIS — E875 Hyperkalemia: Secondary | ICD-10-CM | POA: Diagnosis not present

## 2019-07-02 DIAGNOSIS — K219 Gastro-esophageal reflux disease without esophagitis: Secondary | ICD-10-CM | POA: Diagnosis not present

## 2019-07-02 DIAGNOSIS — E1122 Type 2 diabetes mellitus with diabetic chronic kidney disease: Secondary | ICD-10-CM | POA: Diagnosis not present

## 2019-07-02 DIAGNOSIS — N183 Chronic kidney disease, stage 3 unspecified: Secondary | ICD-10-CM | POA: Diagnosis not present

## 2019-07-02 DIAGNOSIS — G8929 Other chronic pain: Secondary | ICD-10-CM | POA: Diagnosis not present

## 2019-07-02 DIAGNOSIS — E249 Cushing's syndrome, unspecified: Secondary | ICD-10-CM | POA: Diagnosis not present

## 2019-07-10 ENCOUNTER — Other Ambulatory Visit (HOSPITAL_COMMUNITY): Payer: Self-pay | Admitting: Internal Medicine

## 2019-07-10 ENCOUNTER — Ambulatory Visit (HOSPITAL_COMMUNITY)
Admission: RE | Admit: 2019-07-10 | Discharge: 2019-07-10 | Disposition: A | Discharge status: Home / Self Care | Payer: PPO | Source: Ambulatory Visit | Attending: Internal Medicine | Admitting: Internal Medicine

## 2019-07-10 ENCOUNTER — Other Ambulatory Visit: Payer: Self-pay

## 2019-07-10 DIAGNOSIS — M25512 Pain in left shoulder: Secondary | ICD-10-CM | POA: Diagnosis not present

## 2019-07-10 DIAGNOSIS — M542 Cervicalgia: Secondary | ICD-10-CM

## 2019-07-10 DIAGNOSIS — S42202A Unspecified fracture of upper end of left humerus, initial encounter for closed fracture: Secondary | ICD-10-CM | POA: Diagnosis not present

## 2019-07-10 DIAGNOSIS — M546 Pain in thoracic spine: Secondary | ICD-10-CM

## 2019-07-10 DIAGNOSIS — Z471 Aftercare following joint replacement surgery: Secondary | ICD-10-CM | POA: Diagnosis not present

## 2019-07-10 DIAGNOSIS — S299XXA Unspecified injury of thorax, initial encounter: Secondary | ICD-10-CM | POA: Diagnosis not present

## 2019-07-11 DIAGNOSIS — M25512 Pain in left shoulder: Secondary | ICD-10-CM | POA: Diagnosis not present

## 2019-07-23 ENCOUNTER — Ambulatory Visit (HOSPITAL_COMMUNITY)
Admission: RE | Admit: 2019-07-23 | Discharge: 2019-07-23 | Disposition: A | Discharge status: Home / Self Care | Payer: PPO | Source: Ambulatory Visit | Attending: Internal Medicine | Admitting: Internal Medicine

## 2019-07-23 ENCOUNTER — Other Ambulatory Visit: Payer: Self-pay

## 2019-07-23 DIAGNOSIS — Z1231 Encounter for screening mammogram for malignant neoplasm of breast: Secondary | ICD-10-CM | POA: Insufficient documentation

## 2019-07-24 ENCOUNTER — Other Ambulatory Visit (HOSPITAL_COMMUNITY): Payer: Self-pay | Admitting: Internal Medicine

## 2019-07-24 DIAGNOSIS — R928 Other abnormal and inconclusive findings on diagnostic imaging of breast: Secondary | ICD-10-CM

## 2019-07-31 ENCOUNTER — Encounter (HOSPITAL_COMMUNITY): Payer: PPO

## 2019-07-31 ENCOUNTER — Ambulatory Visit (HOSPITAL_COMMUNITY)
Admission: RE | Admit: 2019-07-31 | Discharge: 2019-07-31 | Disposition: A | Discharge status: Home / Self Care | Payer: PPO | Source: Ambulatory Visit | Attending: Internal Medicine | Admitting: Internal Medicine

## 2019-07-31 ENCOUNTER — Other Ambulatory Visit (HOSPITAL_COMMUNITY): Payer: PPO

## 2019-07-31 ENCOUNTER — Other Ambulatory Visit: Payer: Self-pay

## 2019-07-31 DIAGNOSIS — R928 Other abnormal and inconclusive findings on diagnostic imaging of breast: Secondary | ICD-10-CM | POA: Insufficient documentation

## 2019-08-17 DIAGNOSIS — J4550 Severe persistent asthma, uncomplicated: Secondary | ICD-10-CM | POA: Diagnosis not present

## 2019-08-17 DIAGNOSIS — M5136 Other intervertebral disc degeneration, lumbar region: Secondary | ICD-10-CM | POA: Diagnosis not present

## 2019-08-17 DIAGNOSIS — Z87891 Personal history of nicotine dependence: Secondary | ICD-10-CM | POA: Diagnosis not present

## 2019-08-17 DIAGNOSIS — D7219 Other eosinophilia: Secondary | ICD-10-CM | POA: Diagnosis not present

## 2019-08-17 DIAGNOSIS — Z7951 Long term (current) use of inhaled steroids: Secondary | ICD-10-CM | POA: Diagnosis not present

## 2019-08-17 DIAGNOSIS — Z79899 Other long term (current) drug therapy: Secondary | ICD-10-CM | POA: Diagnosis not present

## 2019-08-17 DIAGNOSIS — K08109 Complete loss of teeth, unspecified cause, unspecified class: Secondary | ICD-10-CM | POA: Diagnosis not present

## 2019-08-17 DIAGNOSIS — J301 Allergic rhinitis due to pollen: Secondary | ICD-10-CM | POA: Diagnosis not present

## 2019-08-21 DIAGNOSIS — E249 Cushing's syndrome, unspecified: Secondary | ICD-10-CM | POA: Diagnosis not present

## 2019-08-21 DIAGNOSIS — G720 Drug-induced myopathy: Secondary | ICD-10-CM | POA: Diagnosis not present

## 2019-08-21 DIAGNOSIS — E6609 Other obesity due to excess calories: Secondary | ICD-10-CM | POA: Diagnosis not present

## 2019-08-21 DIAGNOSIS — E782 Mixed hyperlipidemia: Secondary | ICD-10-CM | POA: Diagnosis not present

## 2019-08-21 DIAGNOSIS — D509 Iron deficiency anemia, unspecified: Secondary | ICD-10-CM | POA: Diagnosis not present

## 2019-08-21 DIAGNOSIS — G8929 Other chronic pain: Secondary | ICD-10-CM | POA: Diagnosis not present

## 2019-08-21 DIAGNOSIS — F419 Anxiety disorder, unspecified: Secondary | ICD-10-CM | POA: Diagnosis not present

## 2019-08-21 DIAGNOSIS — E875 Hyperkalemia: Secondary | ICD-10-CM | POA: Diagnosis not present

## 2019-08-21 DIAGNOSIS — E1122 Type 2 diabetes mellitus with diabetic chronic kidney disease: Secondary | ICD-10-CM | POA: Diagnosis not present

## 2019-08-21 DIAGNOSIS — I10 Essential (primary) hypertension: Secondary | ICD-10-CM | POA: Diagnosis not present

## 2019-08-21 DIAGNOSIS — F411 Generalized anxiety disorder: Secondary | ICD-10-CM | POA: Diagnosis not present

## 2019-08-23 DIAGNOSIS — G5602 Carpal tunnel syndrome, left upper limb: Secondary | ICD-10-CM | POA: Diagnosis not present

## 2019-08-23 DIAGNOSIS — M65331 Trigger finger, right middle finger: Secondary | ICD-10-CM | POA: Diagnosis not present

## 2019-08-24 DIAGNOSIS — Z96651 Presence of right artificial knee joint: Secondary | ICD-10-CM | POA: Diagnosis not present

## 2019-08-24 DIAGNOSIS — Z471 Aftercare following joint replacement surgery: Secondary | ICD-10-CM | POA: Diagnosis not present

## 2019-08-24 DIAGNOSIS — M1712 Unilateral primary osteoarthritis, left knee: Secondary | ICD-10-CM | POA: Diagnosis not present

## 2019-08-25 DIAGNOSIS — M1712 Unilateral primary osteoarthritis, left knee: Secondary | ICD-10-CM | POA: Diagnosis not present

## 2019-08-27 DIAGNOSIS — J441 Chronic obstructive pulmonary disease with (acute) exacerbation: Secondary | ICD-10-CM | POA: Diagnosis not present

## 2019-08-27 DIAGNOSIS — M797 Fibromyalgia: Secondary | ICD-10-CM | POA: Diagnosis not present

## 2019-08-27 DIAGNOSIS — K219 Gastro-esophageal reflux disease without esophagitis: Secondary | ICD-10-CM | POA: Diagnosis not present

## 2019-08-27 DIAGNOSIS — E1122 Type 2 diabetes mellitus with diabetic chronic kidney disease: Secondary | ICD-10-CM | POA: Diagnosis not present

## 2019-08-27 DIAGNOSIS — Z712 Person consulting for explanation of examination or test findings: Secondary | ICD-10-CM | POA: Diagnosis not present

## 2019-08-27 DIAGNOSIS — E875 Hyperkalemia: Secondary | ICD-10-CM | POA: Diagnosis not present

## 2019-08-27 DIAGNOSIS — F411 Generalized anxiety disorder: Secondary | ICD-10-CM | POA: Diagnosis not present

## 2019-08-27 DIAGNOSIS — Z0001 Encounter for general adult medical examination with abnormal findings: Secondary | ICD-10-CM | POA: Diagnosis not present

## 2019-08-27 DIAGNOSIS — J45909 Unspecified asthma, uncomplicated: Secondary | ICD-10-CM | POA: Diagnosis not present

## 2019-08-27 DIAGNOSIS — D509 Iron deficiency anemia, unspecified: Secondary | ICD-10-CM | POA: Diagnosis not present

## 2019-08-27 DIAGNOSIS — M1990 Unspecified osteoarthritis, unspecified site: Secondary | ICD-10-CM | POA: Diagnosis not present

## 2019-08-27 DIAGNOSIS — M8580 Other specified disorders of bone density and structure, unspecified site: Secondary | ICD-10-CM | POA: Diagnosis not present

## 2019-08-27 DIAGNOSIS — I10 Essential (primary) hypertension: Secondary | ICD-10-CM | POA: Diagnosis not present

## 2019-09-03 ENCOUNTER — Encounter (HOSPITAL_COMMUNITY)
Admission: RE | Admit: 2019-09-03 | Discharge: 2019-09-03 | Disposition: A | Discharge status: Home / Self Care | Payer: PPO | Source: Ambulatory Visit | Attending: Internal Medicine | Admitting: Internal Medicine

## 2019-09-03 ENCOUNTER — Other Ambulatory Visit: Payer: Self-pay

## 2019-09-03 DIAGNOSIS — M810 Age-related osteoporosis without current pathological fracture: Secondary | ICD-10-CM | POA: Insufficient documentation

## 2019-09-03 MED ORDER — DENOSUMAB 60 MG/ML ~~LOC~~ SOSY
60.0000 mg | PREFILLED_SYRINGE | Freq: Once | SUBCUTANEOUS | Status: AC
  Administered 2019-09-03: 60 mg via SUBCUTANEOUS

## 2019-09-03 MED ORDER — DENOSUMAB 60 MG/ML ~~LOC~~ SOSY
PREFILLED_SYRINGE | SUBCUTANEOUS | Status: AC
  Filled 2019-09-03: qty 1

## 2019-09-07 DIAGNOSIS — M179 Osteoarthritis of knee, unspecified: Secondary | ICD-10-CM | POA: Diagnosis not present

## 2019-09-07 DIAGNOSIS — M170 Bilateral primary osteoarthritis of knee: Secondary | ICD-10-CM | POA: Diagnosis not present

## 2019-09-24 DIAGNOSIS — G894 Chronic pain syndrome: Secondary | ICD-10-CM | POA: Diagnosis not present

## 2019-10-02 NOTE — Patient Instructions (Addendum)
DUE TO COVID-19 ONLY ONE VISITOR IS ALLOWED TO COME WITH YOU AND STAY IN THE WAITING ROOM ONLY DURING PRE OP AND PROCEDURE DAY OF SURGERY. THE 1 VISITOR MAY VISIT WITH YOU AFTER SURGERY IN YOUR PRIVATE ROOM DURING VISITING HOURS ONLY!  YOU NEED TO HAVE A COVID 19 TEST ON 10-11-19 @ 2:00 PM_______, THIS TEST MUST BE DONE BEFORE SURGERY, COME  Glenda Terry, Glenda Terry , [id number].  ([place]) ONCE YOUR COVID TEST IS COMPLETED, PLEASE BEGIN THE QUARANTINE INSTRUCTIONS AS OUTLINED IN YOUR HANDOUT.                Glenda Terry  10/02/2019   Your procedure is scheduled on: 10-15-19   Report to [facility] Main  Entrance    Report to Short Stay at 5:30 AM     Call this number if you have problems the morning of surgery [phone]    Remember: After Midnight, you may have a Clear Liquid Diet until 4:15 AM. After 4:15 AM, nothing until after surgery.      CLEAR LIQUID DIET   Foods Allowed                                                                     Foods Excluded  Coffee and tea, regular and decaf                             liquids that you cannot  Plain Jell-O any favor except red or purple                                           see through such as: Fruit ices (not with fruit pulp)                                     milk, soups, orange juice  Iced Popsicles                                    All solid food Carbonated beverages, regular and diet                                    Cranberry, grape and apple juices Sports drinks like Gatorade Lightly seasoned clear broth or consume(fat free) Sugar, honey syrup   _____________________________________________________________________     Take these medicines the morning of surgery with A SIP OF WATER: Alprazolam (Xanax), Esomeprazole (Nexium), Famotidine (Pepcid), Fexofenadine (Allegra). You may also use your inhaler, nasal spray and eyedrops   BRUSH YOUR TEETH MORNING OF SURGERY AND RINSE  YOUR MOUTH OUT, NO CHEWING GUM CANDY OR MINTS.                                You may not have any metal on your  body including hair pins and              piercings     Do not wear jewelry, make-up, lotions, powders or perfumes, deodorant              Do not wear nail polish on your fingernails.  Do not shave  48 hours prior to surgery.                 Do not bring valuables to the hospital. Glenda Terry.  Contacts, dentures or bridgework may not be worn into surgery.  You may bring a small overnight bag     Special Instructions: N/A              Please read over the following fact sheets you were given: _____________________________________________________________________             [facility] - Preparing for Surgery Before surgery, you can play an important role.  Because skin is not sterile, your skin needs to be as free of germs as possible.  You can reduce the number of germs on your skin by washing with Antibacterial soap before surgery.    Do not shave (including legs and underarms) for at least 48 hours prior to the first CHG shower.  You may shave your face/neck. Please follow these instructions carefully:  1.  Shower with Soap the night before surgery and the  morning of Surgery.  2.  If you choose to wash your hair, wash your hair first as usual with your  normal  shampoo.  3.  After you shampoo, rinse your hair and body thoroughly to remove the  shampoo.                                         4.  Pat yourself dry with a clean towel.          5  Wear clean pajamas.           6  Place clean sheets on your bed the night of your first shower and do not  sleep with pets. Day of Surgery : Do not apply any lotions/deodorants the morning of surgery.  Please wear clean clothes to the hospital/surgery center.  FAILURE TO FOLLOW THESE INSTRUCTIONS MAY RESULT IN THE CANCELLATION OF YOUR SURGERY PATIENT  SIGNATURE_________________________________  NURSE SIGNATURE__________________________________  ________________________________________________________________________   [name]  An incentive spirometer is a tool that can help keep your lungs clear and active. This tool measures how well you are filling your lungs with each breath. Taking long deep breaths may help reverse or decrease the chance of developing breathing (pulmonary) problems (especially infection) following:  A long period of time when you are unable to move or be active. BEFORE THE PROCEDURE   If the spirometer includes an indicator to show your best effort, your nurse or respiratory therapist will set it to a desired goal.  If possible, sit up straight or lean slightly forward. Try not to slouch.  Hold the incentive spirometer in an upright position. INSTRUCTIONS FOR USE   1. Sit on the edge of your bed if possible, or sit up as far as you can in bed or on a chair. 2. Hold the incentive spirometer in an upright position.  3. Breathe out normally. 4. Place the mouthpiece in your mouth and seal your lips tightly around it. 5. Breathe in slowly and as deeply as possible, raising the piston or the ball toward the top of the column. 6. Hold your breath for 3-5 seconds or for as long as possible. Allow the piston or ball to fall to the bottom of the column. 7. Remove the mouthpiece from your mouth and breathe out normally. 8. Rest for a few seconds and repeat Steps 1 through 7 at least 10 times every 1-2 hours when you are awake. Take your time and take a few normal breaths between deep breaths. 9. The spirometer may include an indicator to show your best effort. Use the indicator as a goal to work toward during each repetition. 10. After each set of 10 deep breaths, practice coughing to be sure your lungs are clear. If you have an incision (the cut made at the time of surgery), support your incision when coughing  by placing a pillow or rolled up towels firmly against it. Once you are able to get out of bed, walk around indoors and cough well. You may stop using the incentive spirometer when instructed by your caregiver.  RISKS AND COMPLICATIONS  Take your time so you do not get dizzy or light-headed.  If you are in pain, you may need to take or ask for pain medication before doing incentive spirometry. It is harder to take a deep breath if you are having pain. AFTER USE  Rest and breathe slowly and easily.  It can be helpful to keep track of a log of your progress. Your caregiver can provide you with a simple table to help with this. If you are using the spirometer at home, follow these instructions: Agenda IF:   You are having difficultly using the spirometer.  You have trouble using the spirometer as often as instructed.  Your pain medication is not giving enough relief while using the spirometer.  You develop fever of 100.5 F (38.1 C) or higher. SEEK IMMEDIATE MEDICAL CARE IF:   You cough up bloody sputum that had not been present before.  You develop fever of 102 F (38.9 C) or greater.  You develop worsening pain at or near the incision site. MAKE SURE YOU:   Understand these instructions.  Will watch your condition.  Will get help right away if you are not doing well or get worse. Document Released: 07/12/2006 Document Revised: 05/24/2011 Document Reviewed: 09/12/2006 [employer] Patient Information 2014 [employer], [place].   ________________________________________________________________________  WHAT IS A BLOOD TRANSFUSION? Blood Transfusion Information  A transfusion is the replacement of blood or some of its parts. Blood is made up of multiple cells which provide different functions.  Red blood cells carry oxygen and are used for blood loss replacement.  White blood cells fight against infection.  Platelets control bleeding.  Plasma helps clot  blood.  Other blood products are available for specialized needs, such as hemophilia or other clotting disorders. BEFORE THE TRANSFUSION  Who gives blood for transfusions?   Healthy volunteers who are fully evaluated to make sure their blood is safe. This is blood bank blood. Transfusion therapy is the safest it has ever been in the practice of medicine. Before blood is taken from a donor, a complete history is taken to make sure that person has no history of diseases nor engages in risky social behavior (examples are intravenous drug use or sexual activity with multiple partners).  The donor's travel history is screened to minimize risk of transmitting infections, such as malaria. The donated blood is tested for signs of infectious diseases, such as HIV and hepatitis. The blood is then tested to be sure it is compatible with you in order to minimize the chance of a transfusion reaction. If you or a relative donates blood, this is often done in anticipation of surgery and is not appropriate for emergency situations. It takes many days to process the donated blood. RISKS AND COMPLICATIONS Although transfusion therapy is very safe and saves many lives, the main dangers of transfusion include:   Getting an infectious disease.  Developing a transfusion reaction. This is an allergic reaction to something in the blood you were given. Every precaution is taken to prevent this. The decision to have a blood transfusion has been considered carefully by your caregiver before blood is given. Blood is not given unless the benefits outweigh the risks. AFTER THE TRANSFUSION  Right after receiving a blood transfusion, you will usually feel much better and more energetic. This is especially true if your red blood cells have gotten low (anemic). The transfusion raises the level of the red blood cells which carry oxygen, and this usually causes an energy increase.  The nurse administering the transfusion will monitor  you carefully for complications. HOME CARE INSTRUCTIONS  No special instructions are needed after a transfusion. You may find your energy is better. Speak with your caregiver about any limitations on activity for underlying diseases you may have. SEEK MEDICAL CARE IF:   Your condition is not improving after your transfusion.  You develop redness or irritation at the intravenous (IV) site. SEEK IMMEDIATE MEDICAL CARE IF:  Any of the following symptoms occur over the next 12 hours:  Shaking chills.  You have a temperature by mouth above 102 F (38.9 C), not controlled by medicine.  Chest, back, or muscle pain.  People around you feel you are not acting correctly or are confused.  Shortness of breath or difficulty breathing.  Dizziness and fainting.  You get a rash or develop hives.  You have a decrease in urine output.  Your urine turns a dark color or changes to pink, red, or brown. Any of the following symptoms occur over the next 10 days:  You have a temperature by mouth above 102 F (38.9 C), not controlled by medicine.  Shortness of breath.  Weakness after normal activity.  The white part of the eye turns yellow (jaundice).  You have a decrease in the amount of urine or are urinating less often.  Your urine turns a dark color or changes to pink, red, or brown. Document Released: 02/27/2000 Document Revised: 05/24/2011 Document Reviewed: 10/16/2007 [facility] Patient Information 2014 [place], [place].  _______________________________________________________________________

## 2019-10-02 NOTE — Progress Notes (Addendum)
COVID Vaccine Completed: Date COVID Vaccine completed: COVID vaccine manufacturer: [employer]   PCP - Allyn Kenner, MD Cardiologist - n/A  Surgical clearance dated 08-17-19 on chart from Dr. Maretta Bees    No stimulator in back for pain control  Chest x-ray - 06-09-19 EKG - 06-09-19 Stress Test -  ECHO -  Cardiac Cath -   Sleep Study -  CPAP -   Fasting Blood Sugar -  Checks Blood Sugar _____ times a day  Blood Thinner Instructions: Aspirin Instructions: Last Dose:  ADL's w/SOB  Anesthesia review:   Patient denies shortness of breath, fever, cough and chest pain at PAT appointment   Patient verbalized understanding of instructions that were given to them at the PAT appointment. Patient was also instructed that they will need to review over the PAT instructions again at home before surgery.

## 2019-10-05 ENCOUNTER — Other Ambulatory Visit: Payer: Self-pay

## 2019-10-05 ENCOUNTER — Encounter (HOSPITAL_COMMUNITY)
Admission: RE | Admit: 2019-10-05 | Discharge: 2019-10-05 | Disposition: A | Discharge status: Home / Self Care | Payer: PPO | Source: Ambulatory Visit | Attending: Orthopedic Surgery | Admitting: Orthopedic Surgery

## 2019-10-05 ENCOUNTER — Encounter (HOSPITAL_COMMUNITY): Payer: Self-pay

## 2019-10-05 DIAGNOSIS — Z01812 Encounter for preprocedural laboratory examination: Secondary | ICD-10-CM | POA: Diagnosis not present

## 2019-10-05 LAB — SURGICAL PCR SCREEN
MRSA, PCR: NEGATIVE
Staphylococcus aureus: NEGATIVE

## 2019-10-05 LAB — COMPREHENSIVE METABOLIC PANEL
ALT: 15 U/L (ref 0–44)
AST: 19 U/L (ref 15–41)
Albumin: 4 g/dL (ref 3.5–5.0)
Alkaline Phosphatase: 44 U/L (ref 38–126)
Anion gap: 7 (ref 5–15)
BUN: 19 mg/dL (ref 8–23)
CO2: 26 mmol/L (ref 22–32)
Calcium: 8.8 mg/dL — ABNORMAL LOW (ref 8.9–10.3)
Chloride: 103 mmol/L (ref 98–111)
Creatinine, Ser: 1.14 mg/dL — ABNORMAL HIGH (ref 0.44–1.00)
GFR calc Af Amer: 54 mL/min — ABNORMAL LOW (ref >60)
GFR calc non Af Amer: 46 mL/min — ABNORMAL LOW (ref >60)
Glucose, Bld: 103 mg/dL — ABNORMAL HIGH (ref 70–99)
Potassium: 4.4 mmol/L (ref 3.5–5.1)
Sodium: 136 mmol/L (ref 135–145)
Total Bilirubin: 0.6 mg/dL (ref 0.3–1.2)
Total Protein: 7 g/dL (ref 6.5–8.1)

## 2019-10-05 LAB — CBC
HCT: 35.8 % — ABNORMAL LOW (ref 36.0–46.0)
Hemoglobin: 10.8 g/dL — ABNORMAL LOW (ref 12.0–15.0)
MCH: 24.9 pg — ABNORMAL LOW (ref 26.0–34.0)
MCHC: 30.2 g/dL (ref 30.0–36.0)
MCV: 82.7 fL (ref 80.0–100.0)
Platelets: 291 10*3/uL (ref 150–400)
RBC: 4.33 MIL/uL (ref 3.87–5.11)
RDW: 15.7 % — ABNORMAL HIGH (ref 11.5–15.5)
WBC: 7.3 10*3/uL (ref 4.0–10.5)
nRBC: 0 % (ref 0.0–0.2)

## 2019-10-05 LAB — APTT: aPTT: 33 seconds (ref 24–36)

## 2019-10-05 LAB — PROTIME-INR
INR: 1.1 (ref 0.8–1.2)
Prothrombin Time: 13.3 seconds (ref 11.4–15.2)

## 2019-10-08 ENCOUNTER — Encounter (HOSPITAL_COMMUNITY): Payer: PPO

## 2019-10-11 ENCOUNTER — Other Ambulatory Visit (HOSPITAL_COMMUNITY)
Admission: RE | Admit: 2019-10-11 | Discharge: 2019-10-11 | Disposition: A | Discharge status: Home / Self Care | Payer: PPO | Source: Ambulatory Visit | Attending: Orthopedic Surgery | Admitting: Orthopedic Surgery

## 2019-10-11 DIAGNOSIS — Z01812 Encounter for preprocedural laboratory examination: Secondary | ICD-10-CM | POA: Diagnosis not present

## 2019-10-11 DIAGNOSIS — Z20822 Contact with and (suspected) exposure to covid-19: Secondary | ICD-10-CM | POA: Insufficient documentation

## 2019-10-11 LAB — SARS CORONAVIRUS 2 (TAT 6-24 HRS): SARS Coronavirus 2: NEGATIVE

## 2019-10-11 NOTE — Progress Notes (Addendum)
Patient called in to say daughter had lost preop instructions.  Gave me fax number to fax preop instructions to daughter suzette cole.  Faxed preop instructions to daughter.  They are to call upon receipt. Did not received. Finally received after faxing x 3 per daughter.  Daughter has questions regarding preop instructions.  States mother has drink with no preop instructions regarding drink.  Nurse looked at preop instructions.  No instructions regarding drink on preop instructions.nor in orders.  LEft note on chart for nurse who saw pt to do call daughter and number on chart to call daughter to review instructions. Informed daughter nurse would call on 10/12/19 to review instructions.

## 2019-10-12 DIAGNOSIS — J4550 Severe persistent asthma, uncomplicated: Secondary | ICD-10-CM | POA: Diagnosis not present

## 2019-10-12 DIAGNOSIS — D7219 Other eosinophilia: Secondary | ICD-10-CM | POA: Diagnosis not present

## 2019-10-12 NOTE — Progress Notes (Signed)
Spoke To [name], Glenda Terry's daughter regarding prep instructions. Pt does have Ensure drink that needs to be consumed by 4:15 AM on day of the surgery. This doesn't appear to be clear on  instructions. Additional questions answered in details. Daughter verbalized understanding.

## 2019-10-14 MED ORDER — BUPIVACAINE LIPOSOME 1.3 % IJ SUSP
20.0000 mL | INTRAMUSCULAR | Status: DC
  Filled 2019-10-14: qty 20

## 2019-10-14 NOTE — H&P (Signed)
TOTAL KNEE ADMISSION H&P  Patient is being admitted for left total knee arthroplasty.  Subjective:  Chief Complaint:left knee pain.  HPI: Glenda Terry, 79 y.o. female, has a history of pain and functional disability in the left knee due to arthritis and has failed non-surgical conservative treatments for greater than 12 weeks to includecorticosteriod injections and activity modification.  Onset of symptoms was gradual, starting 2 years ago with gradually worsening course since that time. The patient noted no past surgery on the left knee(s).  Patient currently rates pain in the left knee(s) at 10 out of 10 with activity. Patient has worsening of pain with activity and weight bearing, pain that interferes with activities of daily living and pain with passive range of motion.  Patient has evidence of joint space narrowing by imaging studies.There is no active infection.  Patient Active Problem List   Diagnosis Date Noted  . Left lower quadrant abdominal pain 02/16/2019  . Constipation 11/28/2017  . Hx of adenomatous colonic polyps 11/28/2017  . 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 Eosinophil count raised 06/09/2016  . Severe persistent asthma 06/07/2016  . COPD (chronic obstructive pulmonary disease) ([place]) 12/16/2015  . Asthma exacerbation 09/20/2015  . Acute respiratory failure ([place]) 09/20/2015  . CKD (chronic kidney disease) stage 3, GFR 30-59 ml/min 09/20/2015  . Hemorrhoids, internal 08/21/2015  . 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 Microcytic anemia 11/01/2012  . Family hx of colon cancer 11/01/2012  . 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 Herniated lumbar intervertebral disc 04/20/2012  . Difficulty in walking(719.7) 03/13/2012  . Stiffness of joint, not elsewhere classified, lower leg 03/13/2012  . Weakness of right leg 03/13/2012  . OA (osteoarthritis) of knee 01/24/2012  . Anxiety 02/09/2011  . HTN (hypertension)  02/09/2011  . Arthritis 02/09/2011  . GERD (gastroesophageal reflux disease) 02/09/2011  . Rheumatoid arthritis ([place]) 02/09/2011   Past Medical History:  Diagnosis Date  . Anxiety   . Asthma   . Back pain, chronic   . CKD (chronic kidney disease) stage 3, GFR 30-59 ml/min   . Compression fx, thoracic spine (HCC)    T - 11  . COPD (chronic obstructive pulmonary disease) ([place]) 12/16/2015  . 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 Essential hypertension   . GERD (gastroesophageal reflux disease)   . HOH (hard of hearing)   . Inflammatory arthritis   . Iron deficiency anemia 01/2012  . Pulmonary eosinophilia   . Spinal stenosis   . Tubular adenoma 11/2012    Past Surgical History:  Procedure Laterality Date  . ABDOMINAL HYSTERECTOMY    . BACK SURGERY    . CARPAL TUNNEL RELEASE Right 4/14  . CATARACT EXTRACTION W/PHACO Left 11/19/2014   Procedure: CATARACT EXTRACTION PHACO AND INTRAOCULAR LENS PLACEMENT (I[facility]);  Surgeon: Williams Che, MD;  Location: [facility];  Service: Ophthalmology;  Laterality: Left;  CDE: 4.77  . CATARACT EXTRACTION W/PHACO Right 02/03/2015   Procedure: CATARACT EXTRACTION PHACO AND INTRAOCULAR LENS PLACEMENT ([place]);  Surgeon: Williams Che, MD;  Location: [facility];  Service: Ophthalmology;  Laterality: Right;  CDE: 4.54  . COLONOSCOPY  06/19/2008   RMR: tortuous and elongated colon with scattered left-sided diverticula/colonic mucosa appeared entirely normal. Prior colonic ulcers had resolved.  . COLONOSCOPY  12/2007   Dr. Gala Romney: Scattered diffuse sigmoid diverticula, 2 areas of ulceration at the hepatic flexure. Biopsies unremarkable.  . COLONOSCOPY N/A 11/20/2012   next TCS 11/2017  . COLONOSCOPY WITH PROPOFOL N/A  12/22/2017   Procedure: COLONOSCOPY WITH PROPOFOL;  Surgeon: Daneil Dolin, MD;  Location[facility];  Service: Endoscopy;  Laterality: N/A;  7:30am  . DECOMPRESSIVE LUMBAR LAMINECTOMY LEVEL 2  04/20/2012   Procedure: DECOMPRESSIVE LUMBAR LAMINECTOMY LEVEL 2;   Surgeon: Tobi Bastos, MD;  Location: [facility];  Service: Orthopedics;  Laterality: Right;  Decompressive Lumbar Laminectomy of the L4 - L5 and L5 - S1 Complete/Laminectomy L5 on the Right (X-Ray)  . ESOPHAGOGASTRODUODENOSCOPY  12/2007   Dr. 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 Small hiatal hernia. Slightly pale duodenal mucosa (biopsy unremarkable)  . ESOPHAGOGASTRODUODENOSCOPY (EGD) WITH PROPOFOL N/A 01/20/2017   Dr. Gala Romney: Medium sized hiatal hernia empiric esophageal dilation for history of dysphagia  . EYE SURGERY     BIL CATARACTS  . IR KYPHO THORACIC WITH BONE BIOPSY  02/08/2017  . IR RADIOLOGIST EVAL & MGMT  02/02/2017  . JOINT REPLACEMENT    . KNEE ARTHROSCOPY Right   . MALONEY DILATION N/A 01/20/2017   Procedure: Venia Minks DILATION;  Surgeon: Daneil Dolin, MD;  [facility];  Service: Endoscopy;  Laterality: N/A;  . POLYPECTOMY  12/22/2017   Procedure: POLYPECTOMY;  Surgeon: Daneil Dolin, MD;  [facility];  Service: Endoscopy;;  . REVERSE SHOULDER ARTHROPLASTY Right 05/24/2014   Procedure: RIGHT SHOULDER REVERSE ARTHROPLASTY;  Surgeon: Netta Cedars, MD;  Location: [facility];  Service: Orthopedics;  Laterality: Right;  . REVERSE SHOULDER ARTHROPLASTY Left 06/10/2017   Procedure: LEFT REVERSE SHOULDER ARTHROPLASTY;  Surgeon: Netta Cedars, MD;  Location: [facility];  Service: Orthopedics;  Laterality: Left;  . TOTAL KNEE ARTHROPLASTY  01/24/2012   Procedure: TOTAL KNEE ARTHROPLASTY;  Surgeon: Gearlean Alf, MD;  Location[facility];  Service: Orthopedics;  Laterality: Right;  . TUBAL LIGATION      Current Facility-Administered Medications  Medication Dose Route Frequency Provider Last Rate Last Admin  . [START ON 10/15/2019] bupivacaine liposome (EXPAREL) 1.3 % injection 266 mg  20 mL Other On Call to OR Gaynelle Arabian, MD       Current Outpatient Medications  Medication Sig Dispense Refill Last Dose  . albuterol  (PROAIR HFA) 108 (90 BASE) MCG/ACT inhaler Inhale 2 puffs into the lungs every 6 (six) hours as needed for wheezing or shortness of breath (For COPD).      Marland Kitchen ALPRAZolam (XANAX) 0.5 MG tablet Take 0.25-0.5 mg by mouth 2 (two) times daily as needed for anxiety.      Marland Kitchen ascorbic acid (VITAMIN C) 500 MG tablet Take 500 mg by mouth daily.     . Benralizumab (FASENRA) 30 MG/ML SOSY Inject 30 mg into the skin every 8 (eight) weeks.      . Cholecalciferol (VITAMIN D3) 50 MCG (2000 UT) TABS Take 2,000 Units by mouth daily.     Marland Kitchen denosumab (PROLIA) 60 MG/ML SOSY injection Inject 60 mg into the skin every 6 (six) months.     . docusate sodium (COLACE) 100 MG capsule Take 100 mg by mouth every other day. In the afternoon     . DULERA 200-5 MCG/ACT AERO Inhale 2 puffs into the lungs 2 (two) times daily.     Marland Kitchen EPINEPHrine (EPIPEN 2-PAK) 0.3 mg/0.3 mL IJ SOAJ injection Inject 0.3 mg into the muscle once.      Marland Kitchen esomeprazole (NEXIUM) 40 MG capsule TAKE 1 CAPSULE BY MOUTH TWICE DAILY. (Patient taking differently: Take 40 mg by mouth in the morning and at bedtime. )  60 capsule 5   . famotidine (PEPCID) 20 MG tablet Take 20 mg by mouth at bedtime.     . ferrous sulfate 325 (65 FE) MG tablet Take 325 mg by mouth daily with breakfast.     . fexofenadine (ALLEGRA) 180 MG tablet Take 180 mg by mouth daily.     . fluticasone (FLONASE) 50 MCG/ACT nasal spray Place 1-2 sprays into both nostrils daily as needed for allergies or rhinitis.     Marland Kitchen ketotifen (ZADITOR) 0.025 % ophthalmic solution Place 1 drop into both eyes 2 (two) times daily as needed (allergies).      Marland Kitchen LINZESS 290 MCG CAPS capsule TAKE 1 CAPSULE BY MOUTH DAILY BEFORE BREAKFAST (Patient taking differently: Take 290 mcg by mouth daily before breakfast. ) 30 capsule 5   . Magnesium 250 MG TABS Take 250 mg by mouth daily.      . montelukast (SINGULAIR) 10 MG tablet Take 10 mg by mouth at bedtime.      . mupirocin ointment (BACTROBAN) 2 % Apply 1 application  topically 2 (two) times daily as needed (skin irritation/wound care).      Marland Kitchen olmesartan (BENICAR) 40 MG tablet Take 40 mg by mouth daily.     Marland Kitchen oxyCODONE-acetaminophen (PERCOCET) 10-325 MG tablet Take 1 tablet by mouth 4 (four) times daily as needed (pain.).      Marland Kitchen Polyethyl Glycol-Propyl Glycol (SYSTANE OP) Place 1 drop into both eyes 3 (three) times daily as needed (dry/irritated eyes.).      Marland Kitchen polyethylene glycol (MIRALAX / GLYCOLAX) packet Take 17 g by mouth daily as needed (constipation.).      Marland Kitchen potassium chloride (K-DUR) 10 MEQ tablet Take 10 mEq by mouth every other day. IN THE MORNING     . torsemide (DEMADEX) 10 MG tablet Take 10 mg by mouth every other day. IN THE MORNING     . Nebulizers (DEVILBISS TRAVELER NEBULIZER) MISC With tubing and supplies to be used with nebulized medications. Pt's nebulizer is over 5 years old and it is broken.  J45.50 Severe persistent asthma, J44.9 COPD      Allergies  Allergen Reactions  . Flexeril [Cyclobenzaprine] Anaphylaxis  . Other Anaphylaxis and Other (See Comments)    ALL TREE NUTS  . Keflex [Cephalexin] Itching  . Aspirin Other (See Comments)    Cannot take due to ulcers  . Statins Swelling and Other (See Comments)    Pain  . Adhesive [Tape] Rash  . Chlorhexidine Rash    Social History   Tobacco Use  . Smoking status: Former Smoker    Packs/day: 2.00    Years: 40.00    Pack years: 80.00    Types: Cigarettes    Quit date: 01/19/1992    Years since quitting: 27.7  . Smokeless tobacco: Never Used  Substance Use Topics  . Alcohol use: No    Family History  Problem Relation Age of Onset  . Breast cancer Sister   . Colon cancer Sister        two deceased, one living and terminal, one current undergoing treatment, ages 15, 48, 7, 41  . Hypertension Mother   . Stroke Mother   . Heart attack Mother   . Hypertension Father   . Prostate cancer Father      Review of Systems  Constitutional: Negative for chills and fever.    Respiratory: Negative for cough and shortness of breath.   Cardiovascular: Negative for chest pain.  Gastrointestinal: Negative for nausea and  vomiting.  Musculoskeletal: Positive for arthralgias.    Objective:  Physical Exam Patient is a 79 year old female.  Well nourished and well developed. General: Alert and oriented x3, cooperative and pleasant, no acute distress. Head: normocephalic, atraumatic, neck supple. Eyes: EOMI. Respiratory: breath sounds clear in all fields, no wheezing, rales, or rhonchi. Cardiovascular: Regular rate and rhythm, no murmurs, gallops or rubs. Abdomen: non-tender to palpation and soft, normoactive bowel sounds. Musculoskeletal: Left Knee Exam: No effusion present. No swelling present. The range of motion is: 5 to 130 degrees. Marked crepitus on range of motion of the knee. Positive medial joint line tenderness. Positive lateral joint line tenderness. The knee is stable.  Calves soft and nontender. Motor function intact in LE. Strength 5/5 LE bilaterally. Neuro: Distal pulses 2+. Sensation to light touch intact in LE.  Vital signs in last 24 hours:    Labs:   Estimated body mass index is 37.4 kg/m as calculated from the following:   Height as of 10/05/19: 5' (1.524 m).   Weight as of 10/05/19: 86.9 kg.   Imaging Review Plain radiographs demonstrate severe degenerative joint disease of the left knee(s). The overall alignment isneutral. The bone quality appears to be adequate for age and reported activity level.      Assessment/Plan:  End stage arthritis, left knee   The patient history, physical examination, clinical judgment of the provider and imaging studies are consistent with end stage degenerative joint disease of the left knee(s) and total knee arthroplasty is deemed medically necessary. The treatment options including medical management, injection therapy arthroscopy and arthroplasty were discussed at length. The risks and  benefits of total knee arthroplasty were presented and reviewed. The risks due to aseptic loosening, infection, stiffness, patella tracking problems, thromboembolic complications and other imponderables were discussed. The patient acknowledged the explanation, agreed to proceed with the plan and consent was signed. Patient is being admitted for inpatient treatment for surgery, pain control, PT, OT, prophylactic antibiotics, VTE prophylaxis, progressive ambulation and ADL's and discharge planning. The patient is planning to be discharged home.  Therapy Plans: SNF vs HHPT then OPPT Disposition: SNF vs home with family/HHPT Planned DVT Prophylaxis: Xarelto 10mg  daily DME needed: none PCP: Dr. Delphina Cahill Pulmonogist: Dr. Soledad Gerlach TXA: IV Allergies: aspirin - GI upset, chlorhexadine - rash, cyclobenzaprine - anaphylaxis, Keflex - itching Anesthesia Concerns: none BMI: 39.9 Not diabetic.  Other: Percocet 10 mg 5 times daily.   Patient's anticipated LOS is less than 2 midnights, meeting these requirements: - Lives within 1 hour of care - Has a competent adult at home to recover with post-op recover - NO history of  - Chronic pain requiring opiods  - Diabetes  - Coronary Artery Disease  - Heart failure  - Heart attack  - Stroke  - DVT/VTE  - Cardiac arrhythmia  - Respiratory Failure/COPD  - Renal failure  - Anemia  - Advanced Liver disease  - Patient was instructed on what medications to stop prior to surgery. - Follow-up visit in 2 weeks with Dr. Wynelle Link - Begin physical therapy following surgery - Pre-operative lab work as pre-surgical testing - Prescriptions will be provided in hospital at time of discharge  Griffith Citron, PA-C Orthopedic Surgery [facility] [phone]

## 2019-10-14 NOTE — Anesthesia Preprocedure Evaluation (Addendum)
Anesthesia Evaluation  Patient identified by MRN, date of birth, ID band Patient confused    Reviewed: Allergy & Precautions, NPO status , Patient's Chart, lab work & pertinent test results  Airway Mallampati: II  TM Distance: >3 FB     Dental   Pulmonary asthma , COPD, former smoker,    breath sounds clear to auscultation       Cardiovascular hypertension,  Rhythm:Regular Rate:Normal     Neuro/Psych Anxiety    GI/Hepatic Neg liver ROS, GERD  ,  Endo/Other    Renal/GU Renal disease     Musculoskeletal   Abdominal   Peds  Hematology  (+) anemia ,   Anesthesia Other Findings   Reproductive/Obstetrics                            Anesthesia Physical Anesthesia Plan  ASA: III  Anesthesia Plan: Spinal   Post-op Pain Management:  Regional for Post-op pain   Induction: Intravenous  PONV Risk Score and Plan: 3 and Ondansetron, Dexamethasone and Midazolam  Airway Management Planned: Simple Face Mask and Nasal Cannula  Additional Equipment:   Intra-op Plan:   Post-operative Plan:   Informed Consent: I have reviewed the patients History and Physical, chart, labs and discussed the procedure including the risks, benefits and alternatives for the proposed anesthesia with the patient or authorized representative who has indicated his/her understanding and acceptance.     Dental advisory given  Plan Discussed with: CRNA and Anesthesiologist  Anesthesia Plan Comments:        Anesthesia Quick Evaluation

## 2019-10-15 ENCOUNTER — Encounter (HOSPITAL_COMMUNITY)
Admission: RE | Disposition: A | Discharge status: Skilled Nursing Facility | Payer: Self-pay | Source: Home / Self Care | Attending: Orthopedic Surgery

## 2019-10-15 ENCOUNTER — Ambulatory Visit (HOSPITAL_COMMUNITY): Payer: PPO | Admitting: Certified Registered Nurse Anesthetist

## 2019-10-15 ENCOUNTER — Other Ambulatory Visit: Payer: Self-pay

## 2019-10-15 ENCOUNTER — Inpatient Hospital Stay (HOSPITAL_COMMUNITY)
Admission: RE | Admit: 2019-10-15 | Discharge: 2019-10-17 | DRG: 470 | Disposition: A | Discharge status: Skilled Nursing Facility | Payer: PPO | Attending: Orthopedic Surgery | Admitting: Orthopedic Surgery

## 2019-10-15 ENCOUNTER — Encounter (HOSPITAL_COMMUNITY): Payer: Self-pay | Admitting: Orthopedic Surgery

## 2019-10-15 DIAGNOSIS — Z87891 Personal history of nicotine dependence: Secondary | ICD-10-CM | POA: Diagnosis not present

## 2019-10-15 DIAGNOSIS — J4550 Severe persistent asthma, uncomplicated: Secondary | ICD-10-CM | POA: Diagnosis present

## 2019-10-15 DIAGNOSIS — K219 Gastro-esophageal reflux disease without esophagitis: Secondary | ICD-10-CM | POA: Diagnosis not present

## 2019-10-15 DIAGNOSIS — Z800 Family history of malignant neoplasm of digestive organs: Secondary | ICD-10-CM

## 2019-10-15 DIAGNOSIS — Z471 Aftercare following joint replacement surgery: Secondary | ICD-10-CM | POA: Diagnosis not present

## 2019-10-15 DIAGNOSIS — Z6837 Body mass index (BMI) 37.0-37.9, adult: Secondary | ICD-10-CM | POA: Diagnosis not present

## 2019-10-15 DIAGNOSIS — M064 Inflammatory polyarthropathy: Secondary | ICD-10-CM | POA: Diagnosis not present

## 2019-10-15 DIAGNOSIS — Z79899 Other long term (current) drug therapy: Secondary | ICD-10-CM | POA: Diagnosis not present

## 2019-10-15 DIAGNOSIS — I10 Essential (primary) hypertension: Secondary | ICD-10-CM | POA: Diagnosis not present

## 2019-10-15 DIAGNOSIS — M545 Low back pain: Secondary | ICD-10-CM | POA: Diagnosis not present

## 2019-10-15 DIAGNOSIS — Z86010 Personal history of colonic polyps: Secondary | ICD-10-CM | POA: Diagnosis not present

## 2019-10-15 DIAGNOSIS — Z96652 Presence of left artificial knee joint: Secondary | ICD-10-CM | POA: Diagnosis not present

## 2019-10-15 DIAGNOSIS — Z8249 Family history of ischemic heart disease and other diseases of the circulatory system: Secondary | ICD-10-CM

## 2019-10-15 DIAGNOSIS — F419 Anxiety disorder, unspecified: Secondary | ICD-10-CM | POA: Diagnosis present

## 2019-10-15 DIAGNOSIS — N183 Chronic kidney disease, stage 3 unspecified: Secondary | ICD-10-CM | POA: Diagnosis present

## 2019-10-15 DIAGNOSIS — I129 Hypertensive chronic kidney disease with stage 1 through stage 4 chronic kidney disease, or unspecified chronic kidney disease: Secondary | ICD-10-CM | POA: Diagnosis present

## 2019-10-15 DIAGNOSIS — H9190 Unspecified hearing loss, unspecified ear: Secondary | ICD-10-CM | POA: Diagnosis present

## 2019-10-15 DIAGNOSIS — Z803 Family history of malignant neoplasm of breast: Secondary | ICD-10-CM

## 2019-10-15 DIAGNOSIS — M4805 Spinal stenosis, thoracolumbar region: Secondary | ICD-10-CM | POA: Diagnosis not present

## 2019-10-15 DIAGNOSIS — Z96611 Presence of right artificial shoulder joint: Secondary | ICD-10-CM | POA: Diagnosis not present

## 2019-10-15 DIAGNOSIS — Z7401 Bed confinement status: Secondary | ICD-10-CM | POA: Diagnosis not present

## 2019-10-15 DIAGNOSIS — Z90710 Acquired absence of both cervix and uterus: Secondary | ICD-10-CM | POA: Diagnosis not present

## 2019-10-15 DIAGNOSIS — M1712 Unilateral primary osteoarthritis, left knee: Principal | ICD-10-CM | POA: Diagnosis present

## 2019-10-15 DIAGNOSIS — Z20822 Contact with and (suspected) exposure to covid-19: Secondary | ICD-10-CM | POA: Diagnosis present

## 2019-10-15 DIAGNOSIS — Z741 Need for assistance with personal care: Secondary | ICD-10-CM | POA: Diagnosis not present

## 2019-10-15 DIAGNOSIS — Z823 Family history of stroke: Secondary | ICD-10-CM | POA: Diagnosis not present

## 2019-10-15 DIAGNOSIS — G8929 Other chronic pain: Secondary | ICD-10-CM | POA: Diagnosis present

## 2019-10-15 DIAGNOSIS — R262 Difficulty in walking, not elsewhere classified: Secondary | ICD-10-CM | POA: Diagnosis not present

## 2019-10-15 DIAGNOSIS — J8289 Other pulmonary eosinophilia, not elsewhere classified: Secondary | ICD-10-CM | POA: Diagnosis not present

## 2019-10-15 DIAGNOSIS — J449 Chronic obstructive pulmonary disease, unspecified: Secondary | ICD-10-CM | POA: Diagnosis present

## 2019-10-15 DIAGNOSIS — M6281 Muscle weakness (generalized): Secondary | ICD-10-CM | POA: Diagnosis not present

## 2019-10-15 DIAGNOSIS — Z8042 Family history of malignant neoplasm of prostate: Secondary | ICD-10-CM | POA: Diagnosis not present

## 2019-10-15 DIAGNOSIS — M19012 Primary osteoarthritis, left shoulder: Secondary | ICD-10-CM | POA: Diagnosis not present

## 2019-10-15 DIAGNOSIS — E669 Obesity, unspecified: Secondary | ICD-10-CM | POA: Diagnosis present

## 2019-10-15 DIAGNOSIS — M2550 Pain in unspecified joint: Secondary | ICD-10-CM | POA: Diagnosis not present

## 2019-10-15 DIAGNOSIS — D649 Anemia, unspecified: Secondary | ICD-10-CM | POA: Diagnosis not present

## 2019-10-15 DIAGNOSIS — J45998 Other asthma: Secondary | ICD-10-CM | POA: Diagnosis not present

## 2019-10-15 DIAGNOSIS — Z96612 Presence of left artificial shoulder joint: Secondary | ICD-10-CM | POA: Diagnosis present

## 2019-10-15 DIAGNOSIS — M069 Rheumatoid arthritis, unspecified: Secondary | ICD-10-CM | POA: Diagnosis present

## 2019-10-15 DIAGNOSIS — M1710 Unilateral primary osteoarthritis, unspecified knee: Secondary | ICD-10-CM | POA: Diagnosis present

## 2019-10-15 DIAGNOSIS — G8918 Other acute postprocedural pain: Secondary | ICD-10-CM | POA: Diagnosis not present

## 2019-10-15 DIAGNOSIS — R531 Weakness: Secondary | ICD-10-CM | POA: Diagnosis not present

## 2019-10-15 DIAGNOSIS — E249 Cushing's syndrome, unspecified: Secondary | ICD-10-CM | POA: Diagnosis not present

## 2019-10-15 DIAGNOSIS — D509 Iron deficiency anemia, unspecified: Secondary | ICD-10-CM | POA: Diagnosis not present

## 2019-10-15 HISTORY — PX: TOTAL KNEE ARTHROPLASTY: SHX125

## 2019-10-15 LAB — TYPE AND SCREEN
ABO/RH(D): B POS
Antibody Screen: NEGATIVE

## 2019-10-15 SURGERY — ARTHROPLASTY, KNEE, TOTAL
Anesthesia: Spinal | Site: Knee | Laterality: Left

## 2019-10-15 MED ORDER — STERILE WATER FOR IRRIGATION IR SOLN
Status: DC | PRN
  Administered 2019-10-15: 2000 mL

## 2019-10-15 MED ORDER — DIPHENHYDRAMINE HCL 12.5 MG/5ML PO ELIX
12.5000 mg | ORAL_SOLUTION | ORAL | Status: DC | PRN
  Administered 2019-10-16 (×2): 25 mg via ORAL
  Filled 2019-10-15 (×2): qty 10

## 2019-10-15 MED ORDER — BISACODYL 10 MG RE SUPP: 10.0000 mg | Freq: Every day | RECTAL | Status: DC | PRN

## 2019-10-15 MED ORDER — OXYCODONE HCL 5 MG PO TABS
10.0000 mg | ORAL_TABLET | ORAL | Status: DC | PRN
  Administered 2019-10-15 (×2): 10 mg via ORAL
  Administered 2019-10-16 (×2): 15 mg via ORAL
  Administered 2019-10-16 (×2): 10 mg via ORAL
  Administered 2019-10-16 – 2019-10-17 (×2): 15 mg via ORAL
  Filled 2019-10-15: qty 2
  Filled 2019-10-15: qty 3
  Filled 2019-10-15: qty 2
  Filled 2019-10-15 (×3): qty 3
  Filled 2019-10-15: qty 2

## 2019-10-15 MED ORDER — PROPOFOL 10 MG/ML IV BOLUS
INTRAVENOUS | Status: AC
  Filled 2019-10-15: qty 20

## 2019-10-15 MED ORDER — FENTANYL CITRATE (PF) 100 MCG/2ML IJ SOLN: 25.0000 ug | INTRAMUSCULAR | Status: DC | PRN

## 2019-10-15 MED ORDER — SODIUM CHLORIDE 0.9 % IV SOLN
INTRAVENOUS | Status: DC
  Administered 2019-10-15: 1000 mL via INTRAVENOUS

## 2019-10-15 MED ORDER — LACTATED RINGERS IV SOLN: INTRAVENOUS | Status: DC

## 2019-10-15 MED ORDER — MOMETASONE FURO-FORMOTEROL FUM 200-5 MCG/ACT IN AERO
2.0000 | INHALATION_SPRAY | Freq: Two times a day (BID) | RESPIRATORY_TRACT | Status: DC
  Administered 2019-10-15 – 2019-10-17 (×4): 2 via RESPIRATORY_TRACT
  Filled 2019-10-15: qty 8.8

## 2019-10-15 MED ORDER — SODIUM CHLORIDE (PF) 0.9 % IJ SOLN
INTRAMUSCULAR | Status: DC | PRN
  Administered 2019-10-15: 60 mL

## 2019-10-15 MED ORDER — DEXAMETHASONE SODIUM PHOSPHATE 10 MG/ML IJ SOLN
8.0000 mg | Freq: Once | INTRAMUSCULAR | Status: AC
  Administered 2019-10-15: 8 mg via INTRAVENOUS

## 2019-10-15 MED ORDER — FERROUS SULFATE 325 (65 FE) MG PO TABS
325.0000 mg | ORAL_TABLET | Freq: Every day | ORAL | Status: DC
  Administered 2019-10-16 – 2019-10-17 (×2): 325 mg via ORAL
  Filled 2019-10-15 (×2): qty 1

## 2019-10-15 MED ORDER — ONDANSETRON HCL 4 MG PO TABS
4.0000 mg | ORAL_TABLET | Freq: Four times a day (QID) | ORAL | Status: DC | PRN
  Filled 2019-10-15: qty 1

## 2019-10-15 MED ORDER — METHOCARBAMOL 500 MG PO TABS
500.0000 mg | ORAL_TABLET | Freq: Four times a day (QID) | ORAL | Status: DC | PRN
  Administered 2019-10-15 – 2019-10-17 (×6): 500 mg via ORAL
  Filled 2019-10-15 (×6): qty 1

## 2019-10-15 MED ORDER — ONDANSETRON HCL 4 MG/2ML IJ SOLN
INTRAMUSCULAR | Status: AC
  Filled 2019-10-15: qty 2

## 2019-10-15 MED ORDER — ALPRAZOLAM 0.25 MG PO TABS
0.2500 mg | ORAL_TABLET | Freq: Two times a day (BID) | ORAL | Status: DC | PRN
  Administered 2019-10-16 – 2019-10-17 (×3): 0.5 mg via ORAL
  Filled 2019-10-15 (×3): qty 2

## 2019-10-15 MED ORDER — CHLORHEXIDINE GLUCONATE 0.12 % MT SOLN: 15.0000 mL | Freq: Once | OROMUCOSAL | Status: DC

## 2019-10-15 MED ORDER — PANTOPRAZOLE SODIUM 40 MG PO TBEC
80.0000 mg | DELAYED_RELEASE_TABLET | Freq: Every day | ORAL | Status: DC
  Administered 2019-10-16 – 2019-10-17 (×2): 80 mg via ORAL
  Filled 2019-10-15 (×2): qty 2

## 2019-10-15 MED ORDER — MIDAZOLAM HCL 5 MG/5ML IJ SOLN
INTRAMUSCULAR | Status: DC | PRN
  Administered 2019-10-15: 1.5 mg via INTRAVENOUS
  Administered 2019-10-15: .5 mg via INTRAVENOUS

## 2019-10-15 MED ORDER — TRANEXAMIC ACID-NACL 1000-0.7 MG/100ML-% IV SOLN
1000.0000 mg | INTRAVENOUS | Status: AC
  Administered 2019-10-15: 1000 mg via INTRAVENOUS
  Filled 2019-10-15: qty 100

## 2019-10-15 MED ORDER — LINACLOTIDE 145 MCG PO CAPS
290.0000 ug | ORAL_CAPSULE | Freq: Every day | ORAL | Status: DC
  Administered 2019-10-16 – 2019-10-17 (×2): 290 ug via ORAL
  Filled 2019-10-15 (×2): qty 2

## 2019-10-15 MED ORDER — FENTANYL CITRATE (PF) 100 MCG/2ML IJ SOLN
INTRAMUSCULAR | Status: DC | PRN
  Administered 2019-10-15: 50 ug via INTRAVENOUS

## 2019-10-15 MED ORDER — ONDANSETRON HCL 4 MG/2ML IJ SOLN
INTRAMUSCULAR | Status: DC | PRN
  Administered 2019-10-15: 4 mg via INTRAVENOUS

## 2019-10-15 MED ORDER — METHOCARBAMOL 500 MG IVPB - SIMPLE MED
500.0000 mg | Freq: Four times a day (QID) | INTRAVENOUS | Status: DC | PRN
  Filled 2019-10-15: qty 50

## 2019-10-15 MED ORDER — 0.9 % SODIUM CHLORIDE (POUR BTL) OPTIME
TOPICAL | Status: DC | PRN
  Administered 2019-10-15: 1000 mL

## 2019-10-15 MED ORDER — PHENOL 1.4 % MT LIQD: 1.0000 | OROMUCOSAL | Status: DC | PRN

## 2019-10-15 MED ORDER — POVIDONE-IODINE 10 % EX SWAB
2.0000 "application " | Freq: Once | CUTANEOUS | Status: AC
  Administered 2019-10-15: 2 via TOPICAL

## 2019-10-15 MED ORDER — SODIUM CHLORIDE (PF) 0.9 % IJ SOLN
INTRAMUSCULAR | Status: AC
  Filled 2019-10-15: qty 50

## 2019-10-15 MED ORDER — PHENYLEPHRINE HCL (PRESSORS) 10 MG/ML IV SOLN
INTRAVENOUS | Status: AC
  Filled 2019-10-15: qty 1

## 2019-10-15 MED ORDER — MIDAZOLAM HCL 2 MG/2ML IJ SOLN
INTRAMUSCULAR | Status: AC
  Filled 2019-10-15: qty 2

## 2019-10-15 MED ORDER — MORPHINE SULFATE (PF) 2 MG/ML IV SOLN
0.5000 mg | INTRAVENOUS | Status: DC | PRN
  Administered 2019-10-15: 1 mg via INTRAVENOUS
  Filled 2019-10-15: qty 1

## 2019-10-15 MED ORDER — DOCUSATE SODIUM 100 MG PO CAPS
100.0000 mg | ORAL_CAPSULE | Freq: Two times a day (BID) | ORAL | Status: DC
  Administered 2019-10-15 – 2019-10-17 (×5): 100 mg via ORAL
  Filled 2019-10-15 (×5): qty 1

## 2019-10-15 MED ORDER — OXYCODONE HCL 5 MG PO TABS
10.0000 mg | ORAL_TABLET | ORAL | Status: DC | PRN
  Administered 2019-10-17: 10 mg via ORAL
  Filled 2019-10-15 (×3): qty 2

## 2019-10-15 MED ORDER — FAMOTIDINE 20 MG PO TABS
20.0000 mg | ORAL_TABLET | Freq: Every day | ORAL | Status: DC
  Administered 2019-10-15 – 2019-10-16 (×2): 20 mg via ORAL
  Filled 2019-10-15 (×2): qty 1

## 2019-10-15 MED ORDER — RIVAROXABAN 10 MG PO TABS
10.0000 mg | ORAL_TABLET | Freq: Every day | ORAL | Status: DC
  Administered 2019-10-16 – 2019-10-17 (×2): 10 mg via ORAL
  Filled 2019-10-15 (×2): qty 1

## 2019-10-15 MED ORDER — VANCOMYCIN HCL IN DEXTROSE 1-5 GM/200ML-% IV SOLN
1000.0000 mg | INTRAVENOUS | Status: AC
  Administered 2019-10-15: 1000 mg via INTRAVENOUS
  Filled 2019-10-15: qty 200

## 2019-10-15 MED ORDER — KETOTIFEN FUMARATE 0.025 % OP SOLN
1.0000 [drp] | Freq: Two times a day (BID) | OPHTHALMIC | Status: DC | PRN
  Filled 2019-10-15: qty 5

## 2019-10-15 MED ORDER — BUPIVACAINE IN DEXTROSE 0.75-8.25 % IT SOLN
INTRATHECAL | Status: DC | PRN
  Administered 2019-10-15: 1.8 mL via INTRATHECAL

## 2019-10-15 MED ORDER — SODIUM CHLORIDE (PF) 0.9 % IJ SOLN
INTRAMUSCULAR | Status: AC
  Filled 2019-10-15: qty 10

## 2019-10-15 MED ORDER — ACETAMINOPHEN 10 MG/ML IV SOLN
1000.0000 mg | Freq: Four times a day (QID) | INTRAVENOUS | Status: DC
  Administered 2019-10-15: 1000 mg via INTRAVENOUS
  Filled 2019-10-15: qty 100

## 2019-10-15 MED ORDER — ACETAMINOPHEN 500 MG PO TABS
1000.0000 mg | ORAL_TABLET | Freq: Four times a day (QID) | ORAL | Status: AC
  Administered 2019-10-15 – 2019-10-16 (×4): 1000 mg via ORAL
  Filled 2019-10-15 (×4): qty 2

## 2019-10-15 MED ORDER — FENTANYL CITRATE (PF) 100 MCG/2ML IJ SOLN
INTRAMUSCULAR | Status: AC
  Filled 2019-10-15: qty 2

## 2019-10-15 MED ORDER — POLYETHYLENE GLYCOL 3350 17 G PO PACK
17.0000 g | PACK | Freq: Every day | ORAL | Status: DC | PRN
  Administered 2019-10-16: 17 g via ORAL
  Filled 2019-10-15: qty 1

## 2019-10-15 MED ORDER — PROPOFOL 500 MG/50ML IV EMUL
INTRAVENOUS | Status: AC
  Filled 2019-10-15: qty 50

## 2019-10-15 MED ORDER — PHENYLEPHRINE HCL-NACL 10-0.9 MG/250ML-% IV SOLN
INTRAVENOUS | Status: DC | PRN
  Administered 2019-10-15: 40 ug/min via INTRAVENOUS

## 2019-10-15 MED ORDER — MENTHOL 3 MG MT LOZG: 1.0000 | LOZENGE | OROMUCOSAL | Status: DC | PRN

## 2019-10-15 MED ORDER — SODIUM CHLORIDE 0.9 % IR SOLN
Status: DC | PRN
  Administered 2019-10-15: 1000 mL

## 2019-10-15 MED ORDER — VANCOMYCIN HCL IN DEXTROSE 1-5 GM/200ML-% IV SOLN
1000.0000 mg | Freq: Two times a day (BID) | INTRAVENOUS | Status: AC
  Administered 2019-10-15: 1000 mg via INTRAVENOUS
  Filled 2019-10-15: qty 200

## 2019-10-15 MED ORDER — ORAL CARE MOUTH RINSE
15.0000 mL | Freq: Once | OROMUCOSAL | Status: AC
  Administered 2019-10-15: 15 mL via OROMUCOSAL

## 2019-10-15 MED ORDER — FLEET ENEMA 7-19 GM/118ML RE ENEM: 1.0000 | ENEMA | Freq: Once | RECTAL | Status: DC | PRN

## 2019-10-15 MED ORDER — FLUTICASONE PROPIONATE 50 MCG/ACT NA SUSP
1.0000 | Freq: Every day | NASAL | Status: DC | PRN
  Filled 2019-10-15 (×2): qty 16

## 2019-10-15 MED ORDER — BUPIVACAINE LIPOSOME 1.3 % IJ SUSP
INTRAMUSCULAR | Status: DC | PRN
  Administered 2019-10-15: 20 mL

## 2019-10-15 MED ORDER — HYDROMORPHONE HCL 1 MG/ML IJ SOLN: 0.2500 mg | INTRAMUSCULAR | Status: DC | PRN

## 2019-10-15 MED ORDER — ALBUTEROL SULFATE (2.5 MG/3ML) 0.083% IN NEBU
3.0000 mL | INHALATION_SOLUTION | Freq: Four times a day (QID) | RESPIRATORY_TRACT | Status: DC | PRN
  Administered 2019-10-15 – 2019-10-17 (×3): 3 mL via RESPIRATORY_TRACT
  Filled 2019-10-15 (×3): qty 3

## 2019-10-15 MED ORDER — LORATADINE 10 MG PO TABS
10.0000 mg | ORAL_TABLET | Freq: Every day | ORAL | Status: DC
  Administered 2019-10-16 – 2019-10-17 (×2): 10 mg via ORAL
  Filled 2019-10-15 (×2): qty 1

## 2019-10-15 MED ORDER — TRAMADOL HCL 50 MG PO TABS
50.0000 mg | ORAL_TABLET | Freq: Four times a day (QID) | ORAL | Status: DC | PRN
  Administered 2019-10-15 – 2019-10-17 (×6): 100 mg via ORAL
  Filled 2019-10-15 (×6): qty 2

## 2019-10-15 MED ORDER — PROPOFOL 10 MG/ML IV BOLUS
INTRAVENOUS | Status: DC | PRN
  Administered 2019-10-15 (×4): 10 mg via INTRAVENOUS

## 2019-10-15 MED ORDER — PROPOFOL 500 MG/50ML IV EMUL
INTRAVENOUS | Status: DC | PRN
  Administered 2019-10-15: 40 ug/kg/min via INTRAVENOUS

## 2019-10-15 MED ORDER — METOCLOPRAMIDE HCL 5 MG PO TABS: 5.0000 mg | ORAL_TABLET | Freq: Three times a day (TID) | ORAL | Status: DC | PRN

## 2019-10-15 MED ORDER — ONDANSETRON HCL 4 MG/2ML IJ SOLN
4.0000 mg | Freq: Four times a day (QID) | INTRAMUSCULAR | Status: DC | PRN
  Administered 2019-10-16: 4 mg via INTRAVENOUS
  Filled 2019-10-15: qty 2

## 2019-10-15 MED ORDER — METOCLOPRAMIDE HCL 5 MG/ML IJ SOLN: 5.0000 mg | Freq: Three times a day (TID) | INTRAMUSCULAR | Status: DC | PRN

## 2019-10-15 MED ORDER — DEXAMETHASONE SODIUM PHOSPHATE 10 MG/ML IJ SOLN
10.0000 mg | Freq: Once | INTRAMUSCULAR | Status: AC
  Administered 2019-10-16: 10 mg via INTRAVENOUS
  Filled 2019-10-15: qty 1

## 2019-10-15 MED ORDER — POTASSIUM CHLORIDE ER 10 MEQ PO TBCR
10.0000 meq | EXTENDED_RELEASE_TABLET | ORAL | Status: DC
  Administered 2019-10-16: 10 meq via ORAL
  Filled 2019-10-15 (×2): qty 1

## 2019-10-15 MED ORDER — DEXAMETHASONE SODIUM PHOSPHATE 10 MG/ML IJ SOLN
INTRAMUSCULAR | Status: AC
  Filled 2019-10-15: qty 1

## 2019-10-15 MED ORDER — IRBESARTAN 150 MG PO TABS
300.0000 mg | ORAL_TABLET | Freq: Every day | ORAL | Status: DC
  Administered 2019-10-16 – 2019-10-17 (×2): 300 mg via ORAL
  Filled 2019-10-15 (×2): qty 2

## 2019-10-15 SURGICAL SUPPLY — 55 items
AUGMENT PFC SIG RP SZ2.5 12.5M (Knees) IMPLANT
BAG SPEC THK2 15X12 ZIP CLS (MISCELLANEOUS) ×1
BAG ZIPLOCK 12X15 (MISCELLANEOUS) ×3 IMPLANT
BLADE SAG 18X100X1.27 (BLADE) ×3 IMPLANT
BLADE SAW SGTL 11.0X1.19X90.0M (BLADE) ×3 IMPLANT
BLADE SURG SZ10 CARB STEEL (BLADE) ×6 IMPLANT
BNDG ELASTIC 6X5.8 VLCR STR LF (GAUZE/BANDAGES/DRESSINGS) ×3 IMPLANT
BOWL SMART MIX CTS (DISPOSABLE) ×3 IMPLANT
CEMENT HV SMART SET (Cement) ×6 IMPLANT
CEMENT TIBIA MBT SIZE 2.5 (Knees) IMPLANT
CLOSURE STERI-STRIP 1/2X4 (GAUZE/BANDAGES/DRESSINGS) ×1
CLOSURE WOUND 1/2 X4 (GAUZE/BANDAGES/DRESSINGS) ×2
CLSR STERI-STRIP ANTIMIC 1/2X4 (GAUZE/BANDAGES/DRESSINGS) ×1 IMPLANT
COVER SURGICAL LIGHT HANDLE (MISCELLANEOUS) ×3 IMPLANT
COVER WAND RF STERILE (DRAPES) ×2 IMPLANT
CUFF TOURN SGL QUICK 34 (TOURNIQUET CUFF) ×3
CUFF TRNQT CYL 34X4.125X (TOURNIQUET CUFF) ×1 IMPLANT
DECANTER SPIKE VIAL GLASS SM (MISCELLANEOUS) ×3 IMPLANT
DRAPE U-SHAPE 47X51 STRL (DRAPES) ×3 IMPLANT
DRSG AQUACEL AG ADV 3.5X10 (GAUZE/BANDAGES/DRESSINGS) ×3 IMPLANT
DURAPREP 26ML APPLICATOR (WOUND CARE) ×3 IMPLANT
ELECT REM PT RETURN 15FT ADLT (MISCELLANEOUS) ×3 IMPLANT
FEMUR SIGMA PS SZ 2.5 L (Knees) ×2 IMPLANT
GLOVE BIO SURGEON STRL SZ7 (GLOVE) ×3 IMPLANT
GLOVE BIO SURGEON STRL SZ8 (GLOVE) ×3 IMPLANT
GLOVE BIOGEL PI IND STRL 7.0 (GLOVE) ×1 IMPLANT
GLOVE BIOGEL PI IND STRL 8 (GLOVE) ×1 IMPLANT
GLOVE BIOGEL PI INDICATOR 7.0 (GLOVE) ×2
GLOVE BIOGEL PI INDICATOR 8 (GLOVE) ×2
GOWN STRL REUS W/TWL LRG LVL3 (GOWN DISPOSABLE) ×6 IMPLANT
HANDPIECE INTERPULSE COAX TIP (DISPOSABLE) ×3
HOLDER FOLEY CATH W/STRAP (MISCELLANEOUS) ×2 IMPLANT
IMMOBILIZER KNEE 20 (SOFTGOODS) ×3
IMMOBILIZER KNEE 20 THIGH 36 (SOFTGOODS) ×1 IMPLANT
KIT TURNOVER KIT A (KITS) IMPLANT
MANIFOLD NEPTUNE II (INSTRUMENTS) ×3 IMPLANT
NS IRRIG 1000ML POUR BTL (IV SOLUTION) ×3 IMPLANT
PACK TOTAL KNEE CUSTOM (KITS) ×3 IMPLANT
PADDING CAST COTTON 6X4 STRL (CAST SUPPLIES) ×3 IMPLANT
PATELLA DOME PFC 35MM (Knees) ×2 IMPLANT
PENCIL SMOKE EVACUATOR (MISCELLANEOUS) ×3 IMPLANT
PFC SIGMA RP STB SZ 2.5 12.5M (Knees) ×3 IMPLANT
PIN STEINMAN FIXATION KNEE (PIN) ×2 IMPLANT
PROTECTOR NERVE ULNAR (MISCELLANEOUS) ×3 IMPLANT
SET HNDPC FAN SPRY TIP SCT (DISPOSABLE) ×1 IMPLANT
STRIP CLOSURE SKIN 1/2X4 (GAUZE/BANDAGES/DRESSINGS) ×4 IMPLANT
SUT MNCRL AB 4-0 PS2 18 (SUTURE) ×3 IMPLANT
SUT STRATAFIX 0 PDS 27 VIOLET (SUTURE) ×3
SUT VIC AB 2-0 CT1 27 (SUTURE) ×9
SUT VIC AB 2-0 CT1 TAPERPNT 27 (SUTURE) ×3 IMPLANT
SUTURE STRATFX 0 PDS 27 VIOLET (SUTURE) ×1 IMPLANT
TIBIA MBT CEMENT SIZE 2.5 (Knees) ×3 IMPLANT
TRAY FOLEY MTR SLVR 14FR STAT (SET/KITS/TRAYS/PACK) ×2 IMPLANT
WATER STERILE IRR 1000ML POUR (IV SOLUTION) ×6 IMPLANT
WRAP KNEE MAXI GEL POST OP (GAUZE/BANDAGES/DRESSINGS) ×3 IMPLANT

## 2019-10-15 NOTE — Anesthesia Procedure Notes (Signed)
Spinal  Patient location during procedure: OR Start time: 10/15/2019 7:10 AM End time: 10/15/2019 7:25 AM Staffing Performed: anesthesiologist  Anesthesiologist: Belinda Block, MD Preanesthetic Checklist Completed: patient identified, IV checked, site marked, risks and benefits discussed, surgical consent, monitors and equipment checked, pre-op evaluation and timeout performed Spinal Block Patient position: sitting Prep: Betadine Patient monitoring: heart rate, cardiac monitor, continuous pulse ox and blood pressure Approach: midline Location: L3-4 Injection technique: single-shot Needle Needle type: Whitacre  Needle gauge: 22 G Assessment Sensory level: T12 Additional Notes Clear CSF marcaine 1.8cc neg heme Pt tolerated procedure well. First attempt CRNA JJ

## 2019-10-15 NOTE — Evaluation (Signed)
Physical Therapy Evaluation Patient Details Name: Glenda Terry MRN: [id number] DOB: 1940-11-14 Today's Date: 10/15/2019   History of Present Illness  s/p L TKA. PMH: CKD, HTN, arthritis, bil reverse TSA, R TKA, Spinal stenosis, back pain, Cushing's Syndrome  Clinical Impression  Pt is s/p TKA resulting in the deficits listed below (see PT Problem List).  Pt amb 40' with RW and min assist for safety and balance. Initiated TKA HEP, ROM/strength limited by pain. Pt is quite pleasant and motivated.  Pt will benefit from skilled PT to increase their independence and safety with mobility to allow discharge to the venue listed below.      Follow Up Recommendations Follow surgeon's recommendation for DC plan and follow-up therapies    Equipment Recommendations  None recommended by PT    Recommendations for Other Services       Precautions / Restrictions Precautions Precautions: Fall;Knee Required Braces or Orthoses: Knee Immobilizer - Left Knee Immobilizer - Left: Discontinue once straight leg raise with < 10 degree lag Restrictions Weight Bearing Restrictions: No      Mobility  Bed Mobility Overal bed mobility: Needs Assistance Bed Mobility: Supine to Sit     Supine to sit: Min assist     General bed mobility comments: assist with LLE  Transfers Overall transfer level: Needs assistance   Transfers: Sit to/from Stand Sit to Stand: Min assist         General transfer comment: cues for hand placement  Ambulation/Gait Ambulation/Gait assistance: Min assist;Min guard Gait Distance (Feet): 40 Feet Assistive device: Rolling walker (2 wheeled) Gait Pattern/deviations: Step-to pattern;Decreased stance time - left     General Gait Details: cues for sequence and RW postition  Stairs            Wheelchair Mobility    Modified Rankin (Stroke Patients Only)       Balance                                             Pertinent  Vitals/Pain Pain Assessment: 0-10 Pain Score: 5  Pain Location: left knee Pain Descriptors / Indicators: Grimacing;Guarding Pain Intervention(s): Limited activity within patient's tolerance;Monitored during session;Premedicated before session;Repositioned    Home Living Family/patient expects to be discharged to:: Private residence Living Arrangements: Alone Available Help at Discharge: Available PRN/intermittently;Family Type of Home: Apartment Home Access: Stairs to enter   [employer] of Steps: 1 + 1 (curb and porch) Home Layout: One level Home Equipment: Environmental consultant - 2 wheels;Cane - single point Additional Comments: amb with RW or cane depending on distance (mostly uses cane in the apt)    Prior Function Level of Independence: Independent with assistive device(s);Independent               Hand Dominance        Extremity/Trunk Assessment   Upper Extremity Assessment Upper Extremity Assessment: Overall WFL for tasks assessed    Lower Extremity Assessment Lower Extremity Assessment: LLE deficits/detail LLE Deficits / Details: knee extension and hip flexion 2+/5, limited by pain adn post op weakness       Communication   Communication: No difficulties  Cognition Arousal/Alertness: Awake/alert Behavior During Therapy: WFL for tasks assessed/performed Overall Cognitive Status: Within Functional Limits for tasks assessed  General Comments      Exercises Total Joint Exercises Ankle Circles/Pumps: AROM;Both;10 reps   Assessment/Plan    PT Assessment Patient needs continued PT services  PT Problem List Decreased strength;Decreased range of motion;Decreased activity tolerance;Decreased balance;Decreased mobility;Decreased knowledge of use of DME;Pain       PT Treatment Interventions DME instruction;Gait training;Functional mobility training;Therapeutic activities;Patient/family education;Therapeutic  exercise;Stair training    PT Goals (Current goals can be found in the Care Plan section)  Acute Rehab PT Goals Patient Stated Goal: home PT Goal Formulation: With patient Time For Goal Achievement: 10/22/19 Potential to Achieve Goals: Good    Frequency 7X/week   Barriers to discharge        Co-evaluation               AM-PAC PT "6 Clicks" Mobility  Outcome Measure Help needed turning from your back to your side while in a flat bed without using bedrails?: A Little Help needed moving from lying on your back to sitting on the side of a flat bed without using bedrails?: A Little Help needed moving to and from a bed to a chair (including a wheelchair)?: A Little Help needed standing up from a chair using your arms (e.g., wheelchair or bedside chair)?: A Little Help needed to walk in hospital room?: A Little Help needed climbing 3-5 steps with a railing? : A Lot 6 Click Score: 17    End of Session Equipment Utilized During Treatment: Gait belt;Left knee immobilizer Activity Tolerance: Patient tolerated treatment well Patient left: in chair;with call bell/phone within reach;with chair alarm set   PT Visit Diagnosis: Difficulty in walking, not elsewhere classified (R26.2)    Time: 5456-2563 PT Time Calculation (min) (ACUTE ONLY): 26 min   Charges:   PT Evaluation $PT Eval Low Complexity: 1 Low PT Treatments $Gait Training: 8-22 mins        [name], PT  Acute Rehab Dept ([place]) [phone] Pager [phone]  10/15/2019   [facility] 10/15/2019, 3:56 PM

## 2019-10-15 NOTE — Progress Notes (Signed)
Orthopedic Tech Progress Note Patient Details:  Glenda Terry [birth year] [id number]  CPM Left Knee CPM Left Knee: Off Left Knee Flexion (Degrees): 40 Left Knee Extension (Degrees): 10  Post Interventions Patient Tolerated: Well Instructions Provided: Care of device  [name] 10/15/2019, 1:25 PM

## 2019-10-15 NOTE — Interval H&P Note (Signed)
History and Physical Interval Note:  10/15/2019 6:29 AM  Glenda Terry  has presented today for surgery, with the diagnosis of left knee osteoarthritis.  The various methods of treatment have been discussed with the patient and family. After consideration of risks, benefits and other options for treatment, the patient has consented to  Procedure(s) with comments: TOTAL KNEE ARTHROPLASTY (Left) - 67min as a surgical intervention.  The patient's history has been reviewed, patient examined, no change in status, stable for surgery.  I have reviewed the patient's chart and labs.  Questions were answered to the patient's satisfaction.     Pilar Plate Odarius Dines

## 2019-10-15 NOTE — Progress Notes (Signed)
Orthopedic Tech Progress Note Patient Details:  Glenda Terry 12/18/1940 [id number]  CPM Left Knee CPM Left Knee: On Left Knee Flexion (Degrees): 40 Left Knee Extension (Degrees): 10  Post Interventions Patient Tolerated: Well Instructions Provided: Care of device  [name] 10/15/2019, 9:17 AM

## 2019-10-15 NOTE — Transfer of Care (Signed)
Immediate Anesthesia Transfer of Care Note  Patient: Glenda Terry  Procedure(s) Performed: TOTAL KNEE ARTHROPLASTY (Left Knee)  Patient Location: PACU  Anesthesia Type:Spinal  Level of Consciousness: drowsy and patient cooperative  Airway & Oxygen Therapy: Patient Spontanous Breathing and Patient connected to face mask oxygen  Post-op Assessment: Report given to RN and Post -op Vital signs reviewed and stable  Post vital signs: Reviewed and stable  Last Vitals:  Vitals Value Taken Time  BP    Temp    Pulse    Resp    SpO2      Last Pain:  Vitals:   10/15/19 0629  TempSrc:   PainSc: 9       Patients Stated Pain Goal: 5 (12/50/87 1994)  Complications: No complications documented.

## 2019-10-15 NOTE — Discharge Instructions (Addendum)
 Frank Aluisio, MD Total Joint Specialist [facility] [address] [place], [place] [address] [phone]  TOTAL KNEE REPLACEMENT POSTOPERATIVE DIRECTIONS    Knee Rehabilitation, Guidelines Following Surgery  Results after knee surgery are often greatly improved when you follow the exercise, range of motion and muscle strengthening exercises prescribed by your doctor. Safety measures are also important to protect the knee from further injury. If any of these exercises cause you to have increased pain or swelling in your knee joint, decrease the amount until you are comfortable again and slowly increase them. If you have problems or questions, call your caregiver or physical therapist for advice.   BLOOD CLOT PREVENTION . Take a 10 mg Xarelto once a day for three weeks following surgery. Then take an 81 mg Aspirin once a day for three weeks. Then discontinue Aspirin. . You may resume your vitamins/supplements once you have discontinued the Xarelto. . Do not take any NSAIDs (Advil, Aleve, Ibuprofen, Meloxicam, etc.) until you have discontinued the Xarelto.   HOME CARE INSTRUCTIONS  . Remove items at home which could result in a fall. This includes throw rugs or furniture in walking pathways.  . ICE to the affected knee as much as tolerated. Icing helps control swelling. If the swelling is well controlled you will be more comfortable and rehab easier. Continue to use ice on the knee for pain and swelling from surgery. You may notice swelling that will progress down to the foot and ankle. This is normal after surgery. Elevate the leg when you are not up walking on it.    . Continue to use the breathing machine which will help keep your temperature down. It is common for your temperature to cycle up and down following surgery, especially at night when you are not up moving around and exerting yourself. The breathing machine keeps your lungs expanded and your  temperature down. . Do not place pillow under the operative knee, focus on keeping the knee straight while resting  DIET You may resume your previous home diet once you are discharged from the hospital.  DRESSING / WOUND CARE / SHOWERING . Keep your bulky bandage on for 2 days. On the third post-operative day you may remove the Ace bandage and gauze. There is a waterproof adhesive bandage on your skin which will stay in place until your first follow-up appointment. Once you remove this you will not need to place another bandage . You may begin showering 3 days following surgery, but do not submerge the incision under water.  ACTIVITY For the first 5 days, the key is rest and control of pain and swelling . Do your home exercises twice a day starting on post-operative day 3. On the days you go to physical therapy, just do the home exercises once that day. . You should rest, ice and elevate the leg for 50 minutes out of every hour. Get up and walk/stretch for 10 minutes per hour. After 5 days you can increase your activity slowly as tolerated. . Walk with your walker as instructed. Use the walker until you are comfortable transitioning to a cane. Walk with the cane in the opposite hand of the operative leg. You may discontinue the cane once you are comfortable and walking steadily. . Avoid periods of inactivity such as sitting longer than an hour when not asleep. This helps prevent blood clots.  . You may discontinue the knee immobilizer once you are able to perform a straight leg   raise while lying down. . You may resume a sexual relationship in one month or when given the OK by your doctor.  . You may return to work once you are cleared by your doctor.  . Do not drive a car for 6 weeks or until released by your surgeon.  . Do not drive while taking narcotics.  TED HOSE STOCKINGS Wear the elastic stockings on both legs for three weeks following surgery during the day. You may remove them at night  for sleeping.  WEIGHT BEARING Weight bearing as tolerated with assist device (walker, cane, etc) as directed, use it as long as suggested by your surgeon or therapist, typically at least 4-6 weeks.  POSTOPERATIVE CONSTIPATION PROTOCOL Constipation - defined medically as fewer than three stools per week and severe constipation as less than one stool per week.  One of the most common issues patients have following surgery is constipation.  Even if you have a regular bowel pattern at home, your normal regimen is likely to be disrupted due to multiple reasons following surgery.  Combination of anesthesia, postoperative narcotics, change in appetite and fluid intake all can affect your bowels.  In order to avoid complications following surgery, here are some recommendations in order to help you during your recovery period.  . Colace (docusate) - Pick up an over-the-counter form of Colace or another stool softener and take twice a day as long as you are requiring postoperative pain medications.  Take with a full glass of water daily.  If you experience loose stools or diarrhea, hold the colace until you stool forms back up. If your symptoms do not get better within 1 week or if they get worse, check with your doctor. . Dulcolax (bisacodyl) - Pick up over-the-counter and take as directed by the product packaging as needed to assist with the movement of your bowels.  Take with a full glass of water.  Use this product as needed if not relieved by Colace only.  . MiraLax (polyethylene glycol) - Pick up over-the-counter to have on hand. MiraLax is a solution that will increase the amount of water in your bowels to assist with bowel movements.  Take as directed and can mix with a glass of water, juice, soda, coffee, or tea. Take if you go more than two days without a movement. Do not use MiraLax more than once per day. Call your doctor if you are still constipated or irregular after using this medication for 7 days  in a row.  If you continue to have problems with postoperative constipation, please contact the office for further assistance and recommendations.  If you experience "the worst abdominal pain ever" or develop nausea or vomiting, please contact the office immediatly for further recommendations for treatment.  ITCHING If you experience itching with your medications, try taking only a single pain pill, or even half a pain pill at a time.  You can also use Benadryl over the counter for itching or also to help with sleep.   MEDICATIONS See your medication summary on the "After Visit Summary" that the nursing staff will review with you prior to discharge.  You may have some home medications which will be placed on hold until you complete the course of blood thinner medication.  It is important for you to complete the blood thinner medication as prescribed by your surgeon.  Continue your approved medications as instructed at time of discharge.  PRECAUTIONS . If you experience chest pain or shortness of breath -   call 911 immediately for transfer to the hospital emergency department.  . If you develop a fever greater that 101 F, purulent drainage from wound, increased redness or drainage from wound, foul odor from the wound/dressing, or calf pain - CONTACT YOUR SURGEON.                                                   FOLLOW-UP APPOINTMENTS Make sure you keep all of your appointments after your operation with your surgeon and caregivers. You should call the office at the above phone number and make an appointment for approximately two weeks after the date of your surgery or on the date instructed by your surgeon outlined in the "After Visit Summary".  RANGE OF MOTION AND STRENGTHENING EXERCISES  Rehabilitation of the knee is important following a knee injury or an operation. After just a few days of immobilization, the muscles of the thigh which control the knee become weakened and shrink (atrophy). Knee  exercises are designed to build up the tone and strength of the thigh muscles and to improve knee motion. Often times heat used for twenty to thirty minutes before working out will loosen up your tissues and help with improving the range of motion but do not use heat for the first two weeks following surgery. These exercises can be done on a training (exercise) mat, on the floor, on a table or on a bed. Use what ever works the best and is most comfortable for you Knee exercises include:  . Leg Lifts - While your knee is still immobilized in a splint or cast, you can do straight leg raises. Lift the leg to 60 degrees, hold for 3 sec, and slowly lower the leg. Repeat 10-20 times 2-3 times daily. Perform this exercise against resistance later as your knee gets better.  . Quad and Hamstring Sets - Tighten up the muscle on the front of the thigh (Quad) and hold for 5-10 sec. Repeat this 10-20 times hourly. Hamstring sets are done by pushing the foot backward against an object and holding for 5-10 sec. Repeat as with quad sets.   Leg Slides: Lying on your back, slowly slide your foot toward your buttocks, bending your knee up off the floor (only go as far as is comfortable). Then slowly slide your foot back down until your leg is flat on the floor again.  Angel Wings: Lying on your back spread your legs to the side as far apart as you can without causing discomfort.  A rehabilitation program following serious knee injuries can speed recovery and prevent re-injury in the future due to weakened muscles. Contact your doctor or a physical therapist for more information on knee rehabilitation.   IF YOU ARE TRANSFERRED TO A SKILLED REHAB FACILITY If the patient is transferred to a skilled rehab facility following release from the hospital, a list of the current medications will be sent to the facility for the patient to continue.  When discharged from the skilled rehab facility, please have the facility set up the  patient's Home Health Physical Therapy prior to being released. Also, the skilled facility will be responsible for providing the patient with their medications at time of release from the facility to include their pain medication, the muscle relaxants, and their blood thinner medication. If the patient is still at the rehab facility   at time of the two week follow up appointment, the skilled rehab facility will also need to assist the patient in arranging follow up appointment in our office and any transportation needs. ° °MAKE SURE YOU:  °• Understand these instructions.  °• Get help right away if you are not doing well or get worse.  ° °DENTAL ANTIBIOTICS: ° °In most cases prophylactic antibiotics for Dental procdeures after total joint surgery are not necessary. ° °Exceptions are as follows: ° °1. History of prior total joint infection ° °2. Severely immunocompromised (Organ Transplant, cancer chemotherapy, Rheumatoid biologic °meds such as Humera) ° °3. Poorly controlled diabetes (A1C &gt; 8.0, blood glucose over 200) ° °If you have one of these conditions, contact your surgeon for an antibiotic prescription, prior to your °dental procedure.  ° ° °Pick up stool softner and laxative for home use following surgery while on pain medications. °Do not submerge incision under water. °Please use good hand washing techniques while changing dressing each day. °May shower starting three days after surgery. °Please use a clean towel to pat the incision dry following showers. °Continue to use ice for pain and swelling after surgery. °Do not use any lotions or creams on the incision until instructed by your surgeon. ° °Information on my medicine - XARELTO® (Rivaroxaban) ° °This medication education was reviewed with me or my healthcare representative as part of my discharge preparation.  The pharmacist that spoke with me during my hospital stay was:  ° °Why was Xarelto® prescribed for you? °Xarelto® was prescribed for you to  reduce the risk of blood clots forming after orthopedic surgery. The medical term for these abnormal blood clots is venous thromboembolism (VTE). ° °What do you need to know about xarelto® ? °Take your Xarelto® ONCE DAILY at the same time every day. °You may take it either with or without food. ° °If you have difficulty swallowing the tablet whole, you may crush it and mix in applesauce just prior to taking your dose. ° °Take Xarelto® exactly as prescribed by your doctor and DO NOT stop taking Xarelto® without talking to the doctor who prescribed the medication.  Stopping without other VTE prevention medication to take the place of Xarelto® may increase your risk of developing a clot. ° °After discharge, you should have regular check-up appointments with your healthcare provider that is prescribing your Xarelto®.   ° °What do you do if you miss a dose? °If you miss a dose, take it as soon as you remember on the same day then continue your regularly scheduled once daily regimen the next day. Do not take two doses of Xarelto® on the same day.  ° °Important Safety Information °A possible side effect of Xarelto® is bleeding. You should call your healthcare provider right away if you experience any of the following: °? Bleeding from an injury or your nose that does not stop. °? Unusual colored urine (red or dark brown) or unusual colored stools (red or black). °? Unusual bruising for unknown reasons. °? A serious fall or if you hit your head (even if there is no bleeding). ° °Some medicines may interact with Xarelto® and might increase your risk of bleeding while on Xarelto®. To help avoid this, consult your healthcare provider or pharmacist prior to using any new prescription or non-prescription medications, including herbals, vitamins, non-steroidal anti-inflammatory drugs (NSAIDs) and supplements. ° °This website has more information on Xarelto®: www.xarelto.com. ° ° ° ° °

## 2019-10-15 NOTE — Anesthesia Postprocedure Evaluation (Signed)
Anesthesia Post Note  Patient: Glenda Terry  Procedure(s) Performed: TOTAL KNEE ARTHROPLASTY (Left Knee)     Patient location during evaluation: PACU Anesthesia Type: Spinal Level of consciousness: awake Pain management: pain level controlled Vital Signs Assessment: post-procedure vital signs reviewed and stable Respiratory status: spontaneous breathing Cardiovascular status: stable Postop Assessment: no apparent nausea or vomiting Anesthetic complications: no   No complications documented.  Last Vitals:  Vitals:   10/15/19 1000 10/15/19 1015  BP: (!) 131/74   Pulse: 48 55  Resp: 13 14  Temp:  (!) 36.1 C  SpO2: 100% 100%    Last Pain:  Vitals:   10/15/19 0945  TempSrc:   PainSc: 0-No pain                 Jackie Littlejohn

## 2019-10-15 NOTE — Op Note (Signed)
OPERATIVE REPORT-TOTAL KNEE ARTHROPLASTY   Pre-operative diagnosis- Osteoarthritis  Left knee(s)  Post-operative diagnosis- Osteoarthritis Left knee(s)  Procedure-  Left  Total Knee Arthroplasty  Surgeon- Dione Plover. Kairah Leoni, MD  Assistant- Theresa Duty, PA-C   Anesthesia-  Adductor canal block and spinal  EBL-50 mL   Drains None  Tourniquet time-  Total Tourniquet Time Documented: Thigh (Left) - 32 minutes Total: Thigh (Left) - 32 minutes     Complications- None  Condition-PACU - hemodynamically stable.   Brief Clinical Note  Glenda Terry is a 79 y.o. 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 She has constant pain, with activity and at rest and significant functional deficits with difficulties even with ADLs. She has had extensive non-op management including analgesics, injections of cortisone and viscosupplements, and home exercise program, but remains in significant pain with significant dysfunction. Radiographs show bone on bone arthritis medial and patellofemoral. She presents now for left Total Knee Arthroplasty.    Procedure in detail---   The patient is brought into the operating room and positioned supine on the operating table. After successful administration of  Adductor canal block and spinal,   a tourniquet is placed high on the  Left thigh(s) and the lower extremity is prepped and draped in the usual sterile fashion. Time out is performed by the operating team and then the  Left lower extremity is wrapped in Esmarch, knee flexed and the tourniquet inflated to 300 mmHg.       A midline incision is made with a ten blade through the subcutaneous tissue to the level of the extensor mechanism. A fresh blade is used to make a medial parapatellar arthrotomy. Soft tissue over the proximal medial tibia is subperiosteally elevated to the joint line with a knife and into the semimembranosus bursa with a Cobb  elevator. Soft tissue over the proximal lateral tibia is elevated with attention being paid to avoiding the patellar tendon on the tibial tubercle. The patella is everted, knee flexed 90 degrees and the ACL and PCL are removed. Findings are bone on bone patellofemoral with large global osteophytes        The drill is used to create a starting hole in the distal femur and the canal is thoroughly irrigated with sterile saline to remove the fatty contents. The 5 degree Left  valgus alignment guide is placed into the femoral canal and the distal femoral cutting block is pinned to remove 10 mm off the distal femur. Resection is made with an oscillating saw.      The tibia is subluxed forward and the menisci are removed. The extramedullary alignment guide is placed referencing proximally at the medial aspect of the tibial tubercle and distally along the second metatarsal axis and tibial crest. The block is pinned to remove 3mm off the more deficient medial  side. Resection is made with an oscillating saw. Size 2.5is the most appropriate size for the tibia and the proximal tibia is prepared with the modular drill and keel punch for that size.      The femoral sizing guide is placed and size 2.5 is most appropriate. Rotation is marked off the epicondylar axis and confirmed by creating a rectangular flexion gap at 90 degrees. The size 2.5 cutting block is pinned in this rotation and the anterior, posterior and chamfer cuts are made with the oscillating saw. The intercondylar block is then placed and that cut is made.  Trial size 2.5 tibial component, trial size 2.5 posterior stabilized femur and a 12.5  mm posterior stabilized rotating platform insert trial is placed. Full extension is achieved with excellent varus/valgus and anterior/posterior balance throughout full range of motion. The patella is everted and thickness measured to be 22  mm. Free hand resection is taken to 12 mm, a 35 template is placed, lug holes  are drilled, trial patella is placed, and it tracks normally. Osteophytes are removed off the posterior femur with the trial in place. All trials are removed and the cut bone surfaces prepared with pulsatile lavage. Cement is mixed and once ready for implantation, the size 2.5 tibial implant, size  2.5 posterior stabilized femoral component, and the size 35 patella are cemented in place and the patella is held with the clamp. The trial insert is placed and the knee held in full extension. The Exparel (20 ml mixed with 60 ml saline) is injected into the extensor mechanism, posterior capsule, medial and lateral gutters and subcutaneous tissues.  All extruded cement is removed and once the cement is hard the permanent 12.5 mm posterior stabilized rotating platform insert is placed into the tibial tray.      The wound is copiously irrigated with saline solution and the extensor mechanism closed with #1 V-loc suture. The tourniquet is released for a total tourniquet time of 32  minutes. Flexion against gravity is 140 degrees and the patella tracks normally. Subcutaneous tissue is closed with 2.0 vicryl and subcuticular with running 4.0 Monocryl. The incision is cleaned and dried and steri-strips and a bulky sterile dressing are applied. The limb is placed into a knee immobilizer and the patient is awakened and transported to recovery in stable condition.      Please note that a surgical assistant was a medical necessity for this procedure in order to perform it in a safe and expeditious manner. Surgical assistant was necessary to retract the ligaments and vital neurovascular structures to prevent injury to them and also necessary for proper positioning of the limb to allow for anatomic placement of the prosthesis.   Dione Plover Krish Bailly, MD    10/15/2019, 8:29 AM

## 2019-10-16 ENCOUNTER — Encounter (HOSPITAL_COMMUNITY): Payer: Self-pay | Admitting: Orthopedic Surgery

## 2019-10-16 DIAGNOSIS — Z8042 Family history of malignant neoplasm of prostate: Secondary | ICD-10-CM | POA: Diagnosis not present

## 2019-10-16 DIAGNOSIS — M1710 Unilateral primary osteoarthritis, unspecified knee: Secondary | ICD-10-CM | POA: Diagnosis present

## 2019-10-16 DIAGNOSIS — Z803 Family history of malignant neoplasm of breast: Secondary | ICD-10-CM | POA: Diagnosis not present

## 2019-10-16 DIAGNOSIS — E669 Obesity, unspecified: Secondary | ICD-10-CM | POA: Diagnosis not present

## 2019-10-16 DIAGNOSIS — J449 Chronic obstructive pulmonary disease, unspecified: Secondary | ICD-10-CM | POA: Diagnosis not present

## 2019-10-16 DIAGNOSIS — Z86010 Personal history of colonic polyps: Secondary | ICD-10-CM | POA: Diagnosis not present

## 2019-10-16 DIAGNOSIS — M1712 Unilateral primary osteoarthritis, left knee: Secondary | ICD-10-CM | POA: Diagnosis not present

## 2019-10-16 DIAGNOSIS — Z800 Family history of malignant neoplasm of digestive organs: Secondary | ICD-10-CM | POA: Diagnosis not present

## 2019-10-16 DIAGNOSIS — N183 Chronic kidney disease, stage 3 unspecified: Secondary | ICD-10-CM | POA: Diagnosis not present

## 2019-10-16 DIAGNOSIS — I129 Hypertensive chronic kidney disease with stage 1 through stage 4 chronic kidney disease, or unspecified chronic kidney disease: Secondary | ICD-10-CM | POA: Diagnosis not present

## 2019-10-16 DIAGNOSIS — H9190 Unspecified hearing loss, unspecified ear: Secondary | ICD-10-CM | POA: Diagnosis not present

## 2019-10-16 DIAGNOSIS — Z20822 Contact with and (suspected) exposure to covid-19: Secondary | ICD-10-CM | POA: Diagnosis not present

## 2019-10-16 DIAGNOSIS — Z96612 Presence of left artificial shoulder joint: Secondary | ICD-10-CM | POA: Diagnosis present

## 2019-10-16 DIAGNOSIS — G8929 Other chronic pain: Secondary | ICD-10-CM | POA: Diagnosis not present

## 2019-10-16 DIAGNOSIS — Z90710 Acquired absence of both cervix and uterus: Secondary | ICD-10-CM | POA: Diagnosis not present

## 2019-10-16 DIAGNOSIS — Z6837 Body mass index (BMI) 37.0-37.9, adult: Secondary | ICD-10-CM | POA: Diagnosis not present

## 2019-10-16 DIAGNOSIS — J4550 Severe persistent asthma, uncomplicated: Secondary | ICD-10-CM | POA: Diagnosis not present

## 2019-10-16 DIAGNOSIS — D649 Anemia, unspecified: Secondary | ICD-10-CM | POA: Diagnosis not present

## 2019-10-16 DIAGNOSIS — Z79899 Other long term (current) drug therapy: Secondary | ICD-10-CM | POA: Diagnosis not present

## 2019-10-16 DIAGNOSIS — Z823 Family history of stroke: Secondary | ICD-10-CM | POA: Diagnosis not present

## 2019-10-16 DIAGNOSIS — Z87891 Personal history of nicotine dependence: Secondary | ICD-10-CM | POA: Diagnosis not present

## 2019-10-16 DIAGNOSIS — Z8249 Family history of ischemic heart disease and other diseases of the circulatory system: Secondary | ICD-10-CM | POA: Diagnosis not present

## 2019-10-16 DIAGNOSIS — M069 Rheumatoid arthritis, unspecified: Secondary | ICD-10-CM | POA: Diagnosis not present

## 2019-10-16 DIAGNOSIS — F419 Anxiety disorder, unspecified: Secondary | ICD-10-CM | POA: Diagnosis not present

## 2019-10-16 LAB — CBC
HCT: 30.9 % — ABNORMAL LOW (ref 36.0–46.0)
Hemoglobin: 9.4 g/dL — ABNORMAL LOW (ref 12.0–15.0)
MCH: 25 pg — ABNORMAL LOW (ref 26.0–34.0)
MCHC: 30.4 g/dL (ref 30.0–36.0)
MCV: 82.2 fL (ref 80.0–100.0)
Platelets: 273 10*3/uL (ref 150–400)
RBC: 3.76 MIL/uL — ABNORMAL LOW (ref 3.87–5.11)
RDW: 15.8 % — ABNORMAL HIGH (ref 11.5–15.5)
WBC: 9.9 10*3/uL (ref 4.0–10.5)
nRBC: 0 % (ref 0.0–0.2)

## 2019-10-16 LAB — BASIC METABOLIC PANEL
Anion gap: 4 — ABNORMAL LOW (ref 5–15)
BUN: 13 mg/dL (ref 8–23)
CO2: 24 mmol/L (ref 22–32)
Calcium: 7.6 mg/dL — ABNORMAL LOW (ref 8.9–10.3)
Chloride: 107 mmol/L (ref 98–111)
Creatinine, Ser: 1.08 mg/dL — ABNORMAL HIGH (ref 0.44–1.00)
GFR calc Af Amer: 57 mL/min — ABNORMAL LOW (ref >60)
GFR calc non Af Amer: 49 mL/min — ABNORMAL LOW (ref >60)
Glucose, Bld: 124 mg/dL — ABNORMAL HIGH (ref 70–99)
Potassium: 4.9 mmol/L (ref 3.5–5.1)
Sodium: 135 mmol/L (ref 135–145)

## 2019-10-16 NOTE — NC FL2 (Signed)
[place] MEDICAID FL2 LEVEL OF CARE SCREENING TOOL     IDENTIFICATION  Patient Name: Glenda Terry Birthdate: 1940-10-05 Sex: female Admission Date (Current Location): 10/15/2019  [facility] and [place] Number:  Herbalist and Address:  [facility],  [place] [address], [place]      Provider Number: [id number]  Attending Physician Name and Address:  Gaynelle Arabian, MD  Relative Name and Phone Number:       Current Level of Care: Hospital Recommended Level of Care: [place] Prior Approval Number:    Date Approved/Denied:   PASRR Number: [id number]  Discharge Plan: SNF    Current Diagnoses: Patient Active Problem List   Diagnosis Date Noted  . Primary osteoarthritis of left knee 10/15/2019  . Left lower quadrant abdominal pain 02/16/2019  . Constipation 11/28/2017  . Hx of adenomatous colonic polyps 11/28/2017  . 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 Eosinophil count raised 06/09/2016  . Severe persistent asthma 06/07/2016  . COPD (chronic obstructive pulmonary disease) ([place]) 12/16/2015  . Asthma exacerbation 09/20/2015  . Acute respiratory failure ([place]) 09/20/2015  . CKD (chronic kidney disease) stage 3, GFR 30-59 ml/min 09/20/2015  . Hemorrhoids, internal 08/21/2015  . 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 Microcytic anemia 11/01/2012  . Family hx of colon cancer 11/01/2012  . 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 Herniated lumbar intervertebral disc 04/20/2012  . Difficulty in walking(719.7) 03/13/2012  . Stiffness of joint, not elsewhere classified, lower leg 03/13/2012  . Weakness of right leg 03/13/2012  . OA (osteoarthritis) of knee 01/24/2012  . Anxiety 02/09/2011  . HTN (hypertension) 02/09/2011  . Arthritis 02/09/2011  . GERD (gastroesophageal reflux disease) 02/09/2011  . Rheumatoid arthritis ([place]) 02/09/2011    Orientation RESPIRATION BLADDER Height  & Weight     Self, Time, Situation, Place  Normal Continent Weight: 191 lb 8 oz (86.9 kg) Height:  5' (152.4 cm)  BEHAVIORAL SYMPTOMS/MOOD NEUROLOGICAL BOWEL NUTRITION STATUS      Continent    AMBULATORY STATUS COMMUNICATION OF NEEDS Skin   Limited Assist Verbally Surgical wounds                       Personal Care Assistance Level of Assistance  Bathing, Dressing Bathing Assistance: Limited assistance   Dressing Assistance: Limited assistance     Functional Limitations Info             SPECIAL CARE FACTORS FREQUENCY  PT (By licensed PT), OT (By licensed OT)     PT Frequency: 5x/wk OT Frequency: 5x/wk            Contractures Contractures Info: Not present    Additional Factors Info  Code Status, Allergies Code Status Info: Full Allergies Info: see MAR           Current Medications (10/16/2019):  This is the current hospital active medication list Current Facility-Administered Medications  Medication Dose Route Frequency Provider Last Rate Last Admin  . 0.9 %  sodium chloride infusion   Intravenous Continuous Edmisten, Kristie L, PA 75 mL/hr at 10/15/19 1034 1,000 mL at 10/15/19 1034  . albuterol (PROVENTIL) (2.5 MG/3ML) 0.083% nebulizer solution 3 mL  3 mL Inhalation Q6H PRN Edmisten, Kristie L, PA   3 mL at 10/16/19 0929  . ALPRAZolam (XANAX) tablet 0.25-0.5 mg  0.25-0.5 mg Oral BID PRN Edmisten, Kristie L, PA      . bisacodyl (DULCOLAX) suppository 10 mg  10 mg Rectal Daily PRN Edmisten, Kristie L, PA      . diphenhydrAMINE (BENADRYL) 12.5 MG/5ML elixir 12.5-25 mg  12.5-25 mg Oral Q4H PRN Edmisten, Kristie L, PA   25 mg at 10/16/19 1017  . docusate sodium (COLACE) capsule 100 mg  100 mg Oral BID Edmisten, Kristie L, PA   100 mg at 10/16/19 7591  . famotidine (PEPCID) tablet 20 mg  20 mg Oral QHS Edmisten, Kristie L, PA   20 mg at 10/15/19 2018  . ferrous sulfate tablet 325 mg  325 mg Oral Q breakfast Edmisten, Kristie L, PA   325 mg at 10/16/19 6384  .  fluticasone (FLONASE) 50 MCG/ACT nasal spray 1-2 spray  1-2 spray Each Nare Daily PRN Edmisten, Kristie L, PA      . irbesartan (AVAPRO) tablet 300 mg  300 mg Oral Daily Edmisten, Kristie L, PA   300 mg at 10/16/19 0820  . ketotifen (ZADITOR) 0.025 % ophthalmic solution 1 drop  1 drop Both Eyes BID PRN Edmisten, Kristie L, PA      . linaclotide (LINZESS) capsule 290 mcg  290 mcg Oral QAC breakfast Edmisten, Kristie L, PA   290 mcg at 10/16/19 6659  . loratadine (CLARITIN) tablet 10 mg  10 mg Oral Daily Edmisten, Kristie L, PA   10 mg at 10/16/19 9357  . menthol-cetylpyridinium (CEPACOL) lozenge 3 mg  1 lozenge Oral PRN Edmisten, Kristie L, PA       Or  . phenol (CHLORASEPTIC) mouth spray 1 spray  1 spray Mouth/Throat PRN Edmisten, Kristie L, PA      . methocarbamol (ROBAXIN) tablet 500 mg  500 mg Oral Q6H PRN Edmisten, Kristie L, PA   500 mg at 10/16/19 0820   Or  . methocarbamol (ROBAXIN) 500 mg in dextrose 5 % 50 mL IVPB  500 mg Intravenous Q6H PRN Edmisten, Kristie L, PA      . metoCLOPramide (REGLAN) tablet 5-10 mg  5-10 mg Oral Q8H PRN Edmisten, Kristie L, PA       Or  . metoCLOPramide (REGLAN) injection 5-10 mg  5-10 mg Intravenous Q8H PRN Edmisten, Kristie L, PA      . mometasone-formoterol (DULERA) 200-5 MCG/ACT inhaler 2 puff  2 puff Inhalation BID Edmisten, Kristie L, PA   2 puff at 10/16/19 0817  . morphine 2 MG/ML injection 0.5-1 mg  0.5-1 mg Intravenous Q2H PRN Edmisten, Kristie L, PA   1 mg at 10/15/19 1815  . ondansetron (ZOFRAN) tablet 4 mg  4 mg Oral Q6H PRN Edmisten, Kristie L, PA       Or  . ondansetron (ZOFRAN) injection 4 mg  4 mg Intravenous Q6H PRN Edmisten, Kristie L, PA   4 mg at 10/16/19 1006  . oxyCODONE (Oxy IR/ROXICODONE) immediate release tablet 10-15 mg  10-15 mg Oral Q4H PRN Edmisten, Kristie L, PA   10 mg at 10/16/19 0820  . oxyCODONE (Oxy IR/ROXICODONE) immediate release tablet 10-20 mg  10-20 mg Oral Q4H PRN Edmisten, Kristie L, PA      . pantoprazole  (PROTONIX) EC tablet 80 mg  80 mg Oral Q1200 Edmisten, Kristie L, PA      . polyethylene glycol (MIRALAX / GLYCOLAX) packet 17 g  17 g Oral Daily PRN Edmisten, Kristie L, PA   17 g at 10/16/19 1009  . potassium chloride (KLOR-CON) CR tablet 10 mEq  10 mEq Oral QODAY Edmisten, Kristie L, PA   10 mEq at 10/16/19 0821  . rivaroxaban (  XARELTO) tablet 10 mg  10 mg Oral Q breakfast Edmisten, Kristie L, PA   10 mg at 10/16/19 9892  . sodium phosphate (FLEET) 7-19 GM/118ML enema 1 enema  1 enema Rectal Once PRN Edmisten, Kristie L, PA      . traMADol (ULTRAM) tablet 50-100 mg  50-100 mg Oral Q6H PRN Edmisten, Kristie L, PA   100 mg at 10/16/19 1194     Discharge Medications: Please see discharge summary for a list of discharge medications.  Relevant Imaging Results:  Relevant Lab Results:   Additional Information SS#: [id number]  Amara Justen, LCSW

## 2019-10-16 NOTE — Progress Notes (Addendum)
   Subjective: 1 Day Post-Op Procedure(s) (LRB): TOTAL KNEE ARTHROPLASTY (Left) Patient reports pain as moderate.   Patient seen in rounds by Dr. Wynelle Link. Patient is well, and has had no acute complaints or problems other than pain in the left knee. No issues overnight. Denies chest pain, SOB, or calf pain. Foley catheter to be removed this AM. We will continue therapy today, ambulated 40' yesterday.   Objective: Vital signs in last 24 hours: Temp:  [97 F (36.1 C)-98.6 F (37 C)] 98.6 F (37 C) (08/03 0545) Pulse Rate:  [48-85] 72 (08/03 0545) Resp:  [12-20] 18 (08/03 0545) BP: (95-155)/(60-87) 136/69 (08/03 0545) SpO2:  [98 %-100 %] 98 % (08/03 0545)  Intake/Output from previous day:  Intake/Output Summary (Last 24 hours) at 10/16/2019 0741 Last data filed at 10/16/2019 0600 Gross per 24 hour  Intake 4568.36 ml  Output 3225 ml  Net 1343.36 ml     Intake/Output this shift: No intake/output data recorded.  Labs: Recent Labs    10/16/19 0319  HGB 9.4*   Recent Labs    10/16/19 0319  WBC 9.9  RBC 3.76*  HCT 30.9*  PLT 273   Recent Labs    10/16/19 0319  NA 135  K 4.9  CL 107  CO2 24  BUN 13  CREATININE 1.08*  GLUCOSE 124*  CALCIUM 7.6*   No results for input(s): LABPT, INR in the last 72 hours.  Exam: General - Patient is Alert and Oriented Extremity - Neurologically intact Neurovascular intact Sensation intact distally Dorsiflexion/Plantar flexion intact Dressing - dressing C/D/I Motor Function - intact, moving foot and toes well on exam.   Past Medical History:  Diagnosis Date  . Anxiety   . Asthma   . Back pain, chronic   . CKD (chronic kidney disease) stage 3, GFR 30-59 ml/min   . Compression fx, thoracic spine (HCC)    T - 11  . COPD (chronic obstructive pulmonary disease) ([place]) 12/16/2015  . 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 Essential hypertension   . GERD (gastroesophageal reflux disease)   . HOH (hard of hearing)   . Inflammatory  arthritis   . Iron deficiency anemia 01/2012  . Pulmonary eosinophilia   . Spinal stenosis   . Tubular adenoma 11/2012    Assessment/Plan: 1 Day Post-Op Procedure(s) (LRB): TOTAL KNEE ARTHROPLASTY (Left) Active Problems:   Primary osteoarthritis of left knee  Estimated body mass index is 37.4 kg/m as calculated from the following:   Height as of this encounter: 5' (1.524 m).   Weight as of this encounter: 86.9 kg. Advance diet Up with therapy D/C IV fluids  Anticipated LOS equal to or greater than 2 midnights due to - Age 77 and older with one or more of the following:  - Obesity  - Expected need for hospital services (PT, OT, Nursing) required for safe  discharge  - Anticipated need for postoperative skilled nursing care or inpatient rehab  - Active co-morbidities: Chronic pain requiring opiods and Anemia OR   - Unanticipated findings during/Post Surgery: None  - Patient is a high risk of re-admission due to: None    DVT Prophylaxis - Xarelto Weight bearing as tolerated. Continue therapy.  Plan is to go Skilled nursing facility after hospital stay. Plan for discharge tomorrow if bed available. Consult placed for SNF placement.  Theresa Duty, PA-C Orthopedic Surgery [phone] 10/16/2019, 7:41 AM

## 2019-10-16 NOTE — TOC Initial Note (Signed)
Transition of Care [facility]) - Initial/Assessment Note    Patient Details  Name: Glenda Terry MRN: [id number] Date of Birth: 07/16/1940  Transition of Care [facility]) CM/SW Contact:    Glenda Pall, LCSW Phone Number: 10/16/2019, 10:36 AM  Clinical Narrative:                 Met with pt this morning to review d/c plans.  Per pt, she hopes to go to [facility] for rehab as she has done this x3 in the past after surgeries.  She notes that she lives alone in a senior apartment in [place].  Has had HH and OP in the past as well. Primary contact is her daughter, Glenda Terry, however, pt would like to not place any additional "burden" on her.   Have begun SNF authorization with insurance and hope to have all we need to move pt by tomorrow if possible.  Expected Discharge Plan: [place] Barriers to Discharge: Continued Medical Work up   Patient Goals and CMS Choice Patient states their goals for this hospitalization and ongoing recovery are:: "go to [facility] and then go home"      Expected Discharge Plan and Services Expected Discharge Plan: [place] In-house Referral: Clinical Social Work   Post Acute Care Choice: [place] Living arrangements for the past 2 months: [place] in [place])                                      Prior Living Arrangements/Services Living arrangements for the past 2 months: Apartment ("senior apartment" in [place]) Lives with:: Self Patient language and need for interpreter reviewed:: Yes Do you feel safe going back to the place where you live?: Yes      Need for Family Participation in Patient Care: No (Comment) Care giver support system in place?: Yes (comment)   Criminal Activity/Legal Involvement Pertinent to Current Situation/Hospitalization: No - Comment as needed  Activities of Daily Living Home Assistive Devices/Equipment: Environmental consultant (specify type), Cane (specify quad or straight),  Shower chair with back, Grab bars in shower, Long-handled shoehorn, Nebulizer, Dentures (specify type), Blood pressure cuff, Hand-held shower hose ADL Screening (condition at time of admission) Patient's cognitive ability adequate to safely complete daily activities?: Yes Is the patient deaf or have difficulty hearing?: Yes Does the patient have difficulty seeing, even when wearing glasses/contacts?: No Does the patient have difficulty concentrating, remembering, or making decisions?: No Patient able to express need for assistance with ADLs?: Yes Does the patient have difficulty dressing or bathing?: No Independently performs ADLs?: Yes (appropriate for developmental age) Does the patient have difficulty walking or climbing stairs?: Yes Weakness of Legs: Both Weakness of Arms/Hands: None  Permission Sought/Granted Permission sought to share information with : Facility Sport and exercise psychologist, Family Supports Permission granted to share information with : Yes, Verbal Permission Granted  Share Information with NAME: Glenda Terry     Permission granted to share info w Relationship: daughter  Permission granted to share info w Contact Information: [phone]  Emotional Assessment Appearance:: Appears stated age Attitude/Demeanor/Rapport: Gracious, Engaged Affect (typically observed): Accepting, Pleasant Orientation: : Oriented to Self, Oriented to Place, Oriented to  Time, Oriented to Situation Alcohol / Substance Use: Not Applicable Psych Involvement: No (comment)  Admission diagnosis:  Primary osteoarthritis of left knee [M17.12] Patient Active Problem List   Diagnosis Date Noted  . Primary osteoarthritis of  left knee 10/15/2019  . Left lower quadrant abdominal pain 02/16/2019  . Constipation 11/28/2017  . Hx of adenomatous colonic polyps 11/28/2017  . 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 Eosinophil count raised 06/09/2016  . Severe persistent asthma 06/07/2016  . COPD  (chronic obstructive pulmonary disease) ([place]) 12/16/2015  . Asthma exacerbation 09/20/2015  . Acute respiratory failure ([place]) 09/20/2015  . CKD (chronic kidney disease) stage 3, GFR 30-59 ml/min 09/20/2015  . Hemorrhoids, internal 08/21/2015  . 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 Microcytic anemia 11/01/2012  . Family hx of colon cancer 11/01/2012  . 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 Herniated lumbar intervertebral disc 04/20/2012  . Difficulty in walking(719.7) 03/13/2012  . Stiffness of joint, not elsewhere classified, lower leg 03/13/2012  . Weakness of right leg 03/13/2012  . OA (osteoarthritis) of knee 01/24/2012  . Anxiety 02/09/2011  . HTN (hypertension) 02/09/2011  . Arthritis 02/09/2011  . GERD (gastroesophageal reflux disease) 02/09/2011  . Rheumatoid arthritis ([place]) 02/09/2011   PCP:  Glenda Squibb, MD Pharmacy:   [place], [place] [address] Phone: [phone] Fax: [phone]     Social Determinants of Health (SDOH) Interventions    Readmission Risk Interventions No flowsheet data found.

## 2019-10-16 NOTE — Progress Notes (Signed)
Physical Therapy Treatment Patient Details Name: Glenda Terry MRN: [id number] DOB: [birth year] Today's Date: 10/16/2019    History of Present Illness s/p L TKA. PMH: CKD, HTN, arthritis, bil reverse TSA, R TKA, Spinal stenosis, back pain, Cushing's Syndrome    PT Comments    Pt progressing. Continues to fatigue easily and will benefit from SNF post acute.  Continue PT in acute setting  Follow Up Recommendations  Follow surgeon's recommendation for DC plan and follow-up therapies (would benefit from SNF )     Equipment Recommendations  None recommended by PT    Recommendations for Other Services       Precautions / Restrictions Precautions Precautions: Fall;Knee Precaution Booklet Issued: No Required Braces or Orthoses: Knee Immobilizer - Left Knee Immobilizer - Left: Discontinue once straight leg raise with < 10 degree lag    Mobility  Bed Mobility Overal bed mobility: Needs Assistance Bed Mobility: Sit to Supine       Sit to supine: Min assist   General bed mobility comments: assist with LLE   Transfers Overall transfer level: Needs assistance Equipment used: Rolling walker (2 wheeled) Transfers: Sit to/from Stand Sit to Stand: Min assist         General transfer comment: cues for hand placement and LLE position  Ambulation/Gait Ambulation/Gait assistance: Min assist;Min guard Gait Distance (Feet): 80 Feet Assistive device: Rolling walker (2 wheeled) Gait Pattern/deviations: Step-to pattern;Decreased stance time - left;Antalgic     General Gait Details: cues for sequence and RW position. fatigued, incr WOB with amb incr distance (pt reports this is baseline)    Chief Strategy Officer    Modified Rankin (Stroke Patients Only)       Balance                                            Cognition Arousal/Alertness: Awake/alert Behavior During Therapy: WFL for tasks assessed/performed Overall Cognitive  Status: Within Functional Limits for tasks assessed                                        Exercises Total Joint Exercises Ankle Circles/Pumps: AROM;Both;10 reps Straight Leg Raises: AROM;5 reps;Left    General Comments        Pertinent Vitals/Pain Pain Assessment: 0-10 Pain Score: 4  Pain Location: left knee Pain Descriptors / Indicators: Grimacing;Guarding Pain Intervention(s): Limited activity within patient's tolerance;Monitored during session;Premedicated before session;Ice applied    Home Living Family/patient expects to be discharged to:: Private residence Living Arrangements: Alone                  Prior Function            PT Goals (current goals can now be found in the care plan section) Acute Rehab PT Goals Patient Stated Goal: home PT Goal Formulation: With patient Time For Goal Achievement: 10/22/19 Potential to Achieve Goals: Good Progress towards PT goals: Progressing toward goals    Frequency    7X/week      PT Plan Current plan remains appropriate    Co-evaluation              AM-PAC PT "6 Clicks" Mobility   Outcome Measure  Help needed turning  from your back to your side while in a flat bed without using bedrails?: A Little Help needed moving from lying on your back to sitting on the side of a flat bed without using bedrails?: A Little Help needed moving to and from a bed to a chair (including a wheelchair)?: A Little Help needed standing up from a chair using your arms (e.g., wheelchair or bedside chair)?: A Little Help needed to walk in hospital room?: A Little Help needed climbing 3-5 steps with a railing? : A Lot 6 Click Score: 17    End of Session Equipment Utilized During Treatment: Gait belt;Left knee immobilizer Activity Tolerance: Patient tolerated treatment well Patient left: with call bell/phone within reach;in bed;with bed alarm set   PT Visit Diagnosis: Difficulty in walking, not elsewhere  classified (R26.2)     Time: 6808-8110 PT Time Calculation (min) (ACUTE ONLY): 24 min  Charges:  $Gait Training: 23-37 mins                     [name], PT  Acute Rehab Dept ([place]) [phone] Pager [phone]  10/16/2019    [name] 10/16/2019, 4:12 PM

## 2019-10-16 NOTE — Progress Notes (Signed)
Orthopedic Tech Progress Note Patient Details:  Glenda Terry [birth year] [id number]  CPM Left Knee CPM Left Knee: On Left Knee Flexion (Degrees): 40 Left Knee Extension (Degrees): 10  Post Interventions Patient Tolerated: Well Instructions Provided: Care of device  [name] 10/16/2019, 5:23 PM

## 2019-10-16 NOTE — Progress Notes (Signed)
Orthopedic Tech Progress Note Patient Details:  Glenda Terry 16-Mar-1940 [id number]  CPM Left Knee CPM Left Knee: Off Left Knee Flexion (Degrees): 40 Left Knee Extension (Degrees): 10  Post Interventions Patient Tolerated: Well Instructions Provided: Care of device  [name] 10/16/2019, 6:57 PM

## 2019-10-16 NOTE — Progress Notes (Signed)
Physical Therapy Treatment Patient Details Name: Glenda Terry MRN: [id number] DOB: May 05, 1940 Today's Date: 10/16/2019    History of Present Illness s/p L TKA. PMH: CKD, HTN, arthritis, bil reverse TSA, R TKA, Spinal stenosis, back pain, Cushing's Syndrome    PT Comments    Pt amb in room distance, fatigued from amb to bathroom earlier with nursing staff.  3/4 DOE after short distance amb, pt reports she is going to [facility] because she does not have any help at home. Would benefit from SNF.   Follow Up Recommendations  Follow surgeon's recommendation for DC plan and follow-up therapies (would benefit from SNF )     Equipment Recommendations  None recommended by PT    Recommendations for Other Services       Precautions / Restrictions Precautions Precautions: Fall;Knee Precaution Booklet Issued: No Required Braces or Orthoses: Knee Immobilizer - Left Knee Immobilizer - Left: Discontinue once straight leg raise with < 10 degree lag Restrictions Weight Bearing Restrictions: No    Mobility  Bed Mobility Overal bed mobility: Needs Assistance Bed Mobility: Supine to Sit     Supine to sit: Min assist     General bed mobility comments: assist with LLE  Transfers Overall transfer level: Needs assistance Equipment used: Rolling walker (2 wheeled) Transfers: Sit to/from Stand Sit to Stand: Min assist         General transfer comment: cues for hand placement  Ambulation/Gait Ambulation/Gait assistance: Min assist;Min guard Gait Distance (Feet): 20 Feet Assistive device: Rolling walker (2 wheeled) Gait Pattern/deviations: Step-to pattern;Decreased stance time - left;Antalgic     General Gait Details: cues for sequence and RW postition   Stairs             Wheelchair Mobility    Modified Rankin (Stroke Patients Only)       Balance                                            Cognition Arousal/Alertness:  Awake/alert Behavior During Therapy: WFL for tasks assessed/performed Overall Cognitive Status: Within Functional Limits for tasks assessed                                        Exercises Total Joint Exercises Ankle Circles/Pumps: AROM;Both;10 reps Quad Sets: AROM;Both;10 reps Heel Slides: AAROM;Left;5 reps    General Comments        Pertinent Vitals/Pain Pain Assessment: 0-10 Pain Score: 4  Pain Location: left knee Pain Descriptors / Indicators: Grimacing;Guarding Pain Intervention(s): Limited activity within patient's tolerance;Monitored during session;Premedicated before session    Home Living                      Prior Function            PT Goals (current goals can now be found in the care plan section) Acute Rehab PT Goals Patient Stated Goal: home PT Goal Formulation: With patient Time For Goal Achievement: 10/22/19 Potential to Achieve Goals: Good Progress towards PT goals: Progressing toward goals    Frequency    7X/week      PT Plan Current plan remains appropriate    Co-evaluation              AM-PAC PT "6 Clicks" Mobility  Outcome Measure  Help needed turning from your back to your side while in a flat bed without using bedrails?: A Little Help needed moving from lying on your back to sitting on the side of a flat bed without using bedrails?: A Little Help needed moving to and from a bed to a chair (including a wheelchair)?: A Little Help needed standing up from a chair using your arms (e.g., wheelchair or bedside chair)?: A Little Help needed to walk in hospital room?: A Little Help needed climbing 3-5 steps with a railing? : A Lot 6 Click Score: 17    End of Session Equipment Utilized During Treatment: Gait belt;Left knee immobilizer Activity Tolerance: Patient tolerated treatment well Patient left: in chair;with call bell/phone within reach;with chair alarm set   PT Visit Diagnosis: Difficulty in walking,  not elsewhere classified (R26.2)     Time: 9678-9381 PT Time Calculation (min) (ACUTE ONLY): 16 min  Charges:  $Gait Training: 8-22 mins                     [name], PT  Acute Rehab Dept ([place]) [phone] Pager [phone]  10/16/2019    [facility] 10/16/2019, 11:43 AM

## 2019-10-17 ENCOUNTER — Inpatient Hospital Stay
Admission: RE | Admit: 2019-10-17 | Discharge: 2019-10-26 | Disposition: A | Discharge status: Home / Self Care | Payer: PPO | Source: Ambulatory Visit | Attending: Internal Medicine | Admitting: Internal Medicine

## 2019-10-17 DIAGNOSIS — M6281 Muscle weakness (generalized): Secondary | ICD-10-CM | POA: Diagnosis not present

## 2019-10-17 DIAGNOSIS — M48061 Spinal stenosis, lumbar region without neurogenic claudication: Secondary | ICD-10-CM | POA: Diagnosis not present

## 2019-10-17 DIAGNOSIS — R600 Localized edema: Secondary | ICD-10-CM | POA: Diagnosis not present

## 2019-10-17 DIAGNOSIS — J8289 Other pulmonary eosinophilia, not elsewhere classified: Secondary | ICD-10-CM | POA: Diagnosis not present

## 2019-10-17 DIAGNOSIS — F419 Anxiety disorder, unspecified: Secondary | ICD-10-CM | POA: Diagnosis not present

## 2019-10-17 DIAGNOSIS — N183 Chronic kidney disease, stage 3 unspecified: Secondary | ICD-10-CM | POA: Diagnosis not present

## 2019-10-17 DIAGNOSIS — Z96611 Presence of right artificial shoulder joint: Secondary | ICD-10-CM | POA: Diagnosis not present

## 2019-10-17 DIAGNOSIS — M2550 Pain in unspecified joint: Secondary | ICD-10-CM | POA: Diagnosis not present

## 2019-10-17 DIAGNOSIS — Z471 Aftercare following joint replacement surgery: Secondary | ICD-10-CM | POA: Diagnosis not present

## 2019-10-17 DIAGNOSIS — R531 Weakness: Secondary | ICD-10-CM | POA: Diagnosis not present

## 2019-10-17 DIAGNOSIS — K219 Gastro-esophageal reflux disease without esophagitis: Secondary | ICD-10-CM | POA: Diagnosis not present

## 2019-10-17 DIAGNOSIS — J4550 Severe persistent asthma, uncomplicated: Secondary | ICD-10-CM | POA: Diagnosis not present

## 2019-10-17 DIAGNOSIS — D649 Anemia, unspecified: Secondary | ICD-10-CM | POA: Diagnosis not present

## 2019-10-17 DIAGNOSIS — Z96652 Presence of left artificial knee joint: Secondary | ICD-10-CM | POA: Diagnosis not present

## 2019-10-17 DIAGNOSIS — M1712 Unilateral primary osteoarthritis, left knee: Secondary | ICD-10-CM | POA: Diagnosis not present

## 2019-10-17 DIAGNOSIS — H9190 Unspecified hearing loss, unspecified ear: Secondary | ICD-10-CM | POA: Diagnosis not present

## 2019-10-17 DIAGNOSIS — Z7401 Bed confinement status: Secondary | ICD-10-CM | POA: Diagnosis not present

## 2019-10-17 DIAGNOSIS — D509 Iron deficiency anemia, unspecified: Secondary | ICD-10-CM | POA: Diagnosis not present

## 2019-10-17 DIAGNOSIS — J449 Chronic obstructive pulmonary disease, unspecified: Secondary | ICD-10-CM | POA: Diagnosis not present

## 2019-10-17 DIAGNOSIS — M4805 Spinal stenosis, thoracolumbar region: Secondary | ICD-10-CM | POA: Diagnosis not present

## 2019-10-17 DIAGNOSIS — G8929 Other chronic pain: Secondary | ICD-10-CM | POA: Diagnosis not present

## 2019-10-17 DIAGNOSIS — K5904 Chronic idiopathic constipation: Secondary | ICD-10-CM | POA: Diagnosis not present

## 2019-10-17 DIAGNOSIS — M545 Low back pain: Secondary | ICD-10-CM | POA: Diagnosis not present

## 2019-10-17 DIAGNOSIS — M064 Inflammatory polyarthropathy: Secondary | ICD-10-CM | POA: Diagnosis not present

## 2019-10-17 DIAGNOSIS — M810 Age-related osteoporosis without current pathological fracture: Secondary | ICD-10-CM | POA: Diagnosis not present

## 2019-10-17 DIAGNOSIS — M19012 Primary osteoarthritis, left shoulder: Secondary | ICD-10-CM | POA: Diagnosis not present

## 2019-10-17 DIAGNOSIS — Z741 Need for assistance with personal care: Secondary | ICD-10-CM | POA: Diagnosis not present

## 2019-10-17 DIAGNOSIS — R000 Tachycardia, unspecified: Secondary | ICD-10-CM | POA: Diagnosis not present

## 2019-10-17 DIAGNOSIS — E249 Cushing's syndrome, unspecified: Secondary | ICD-10-CM | POA: Diagnosis not present

## 2019-10-17 DIAGNOSIS — I10 Essential (primary) hypertension: Secondary | ICD-10-CM | POA: Diagnosis not present

## 2019-10-17 DIAGNOSIS — R262 Difficulty in walking, not elsewhere classified: Secondary | ICD-10-CM | POA: Diagnosis not present

## 2019-10-17 DIAGNOSIS — J45998 Other asthma: Secondary | ICD-10-CM | POA: Diagnosis not present

## 2019-10-17 LAB — CBC
HCT: 31.4 % — ABNORMAL LOW (ref 36.0–46.0)
Hemoglobin: 9.5 g/dL — ABNORMAL LOW (ref 12.0–15.0)
MCH: 24.5 pg — ABNORMAL LOW (ref 26.0–34.0)
MCHC: 30.3 g/dL (ref 30.0–36.0)
MCV: 81.1 fL (ref 80.0–100.0)
Platelets: 267 10*3/uL (ref 150–400)
RBC: 3.87 MIL/uL (ref 3.87–5.11)
RDW: 15.8 % — ABNORMAL HIGH (ref 11.5–15.5)
WBC: 11.9 10*3/uL — ABNORMAL HIGH (ref 4.0–10.5)
nRBC: 0 % (ref 0.0–0.2)

## 2019-10-17 LAB — BASIC METABOLIC PANEL
Anion gap: 7 (ref 5–15)
BUN: 17 mg/dL (ref 8–23)
CO2: 22 mmol/L (ref 22–32)
Calcium: 8.3 mg/dL — ABNORMAL LOW (ref 8.9–10.3)
Chloride: 104 mmol/L (ref 98–111)
Creatinine, Ser: 1.27 mg/dL — ABNORMAL HIGH (ref 0.44–1.00)
GFR calc Af Amer: 47 mL/min — ABNORMAL LOW (ref >60)
GFR calc non Af Amer: 41 mL/min — ABNORMAL LOW (ref >60)
Glucose, Bld: 132 mg/dL — ABNORMAL HIGH (ref 70–99)
Potassium: 4.4 mmol/L (ref 3.5–5.1)
Sodium: 133 mmol/L — ABNORMAL LOW (ref 135–145)

## 2019-10-17 LAB — SARS CORONAVIRUS 2 BY RT PCR (HOSPITAL ORDER, PERFORMED IN ~~LOC~~ HOSPITAL LAB): SARS Coronavirus 2: NEGATIVE

## 2019-10-17 MED ORDER — TRAMADOL HCL 50 MG PO TABS: 50.0000 mg | ORAL_TABLET | Freq: Four times a day (QID) | ORAL | 0 refills | Status: DC | PRN

## 2019-10-17 MED ORDER — OXYCODONE HCL 5 MG PO TABS: 10.0000 mg | ORAL_TABLET | Freq: Four times a day (QID) | ORAL | 0 refills | Status: DC | PRN

## 2019-10-17 MED ORDER — RIVAROXABAN 10 MG PO TABS: 10.0000 mg | ORAL_TABLET | Freq: Every day | ORAL | 0 refills | Status: DC

## 2019-10-17 MED ORDER — METHOCARBAMOL 500 MG PO TABS: 500.0000 mg | ORAL_TABLET | Freq: Four times a day (QID) | ORAL | 0 refills | Status: DC | PRN

## 2019-10-17 NOTE — Discharge Summary (Signed)
Physician Discharge Summary   Patient ID: Glenda Terry MRN: [id number] DOB/AGE: 1940-07-06 79 y.o.  Admit date: 10/15/2019 Discharge date: 10/17/2019  Primary Diagnosis: Osteoarthritis, left knee   Admission Diagnoses:  Past Medical History:  Diagnosis Date  . Anxiety   . Asthma   . Back pain, chronic   . CKD (chronic kidney disease) stage 3, GFR 30-59 ml/min   . Compression fx, thoracic spine (HCC)    T - 11  . COPD (chronic obstructive pulmonary disease) ([place]) 12/16/2015  . 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 Essential hypertension   . GERD (gastroesophageal reflux disease)   . HOH (hard of hearing)   . Inflammatory arthritis   . Iron deficiency anemia 01/2012  . Pulmonary eosinophilia   . Spinal stenosis   . Tubular adenoma 11/2012   Discharge Diagnoses:   Active Problems:   Primary osteoarthritis of left knee   Primary osteoarthritis of knee  Estimated body mass index is 37.4 kg/m as calculated from the following:   Height as of this encounter: 5' (1.524 m).   Weight as of this encounter: 86.9 kg.  Procedure:  Procedure(s) (LRB): TOTAL KNEE ARTHROPLASTY (Left)   Consults: None  HPI: Glenda Terry is a 79 y.o. 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 She has constant pain, with activity and at rest and significant functional deficits with difficulties even with ADLs. She has had extensive non-op management including analgesics, injections of cortisone and viscosupplements, and home exercise program, but remains in significant pain with significant dysfunction. Radiographs show bone on bone arthritis medial and patellofemoral. She presents now for left Total Knee Arthroplasty.    Laboratory Data: Admission on 10/15/2019  Component Date Value Ref Range Status  . WBC 10/16/2019 9.9  4.0 - 10.5 K/uL Final  . RBC 10/16/2019 3.76* 3.87 - 5.11 MIL/uL Final  . Hemoglobin 10/16/2019 9.4* 12.0 - 15.0 g/dL Final    . HCT 10/16/2019 30.9* 36 - 46 % Final  . MCV 10/16/2019 82.2  80.0 - 100.0 fL Final  . MCH 10/16/2019 25.0* 26.0 - 34.0 pg Final  . MCHC 10/16/2019 30.4  30.0 - 36.0 g/dL Final  . RDW 10/16/2019 15.8* 11.5 - 15.5 % Final  . Platelets 10/16/2019 273  150 - 400 K/uL Final  . nRBC 10/16/2019 0.0  0.0 - 0.2 % Final   Performed at [facility], [place] [address], [place], [place] [address]  . Sodium 10/16/2019 135  135 - 145 mmol/L Final  . Potassium 10/16/2019 4.9  3.5 - 5.1 mmol/L Final  . Chloride 10/16/2019 107  98 - 111 mmol/L Final  . CO2 10/16/2019 24  22 - 32 mmol/L Final  . Glucose, Bld 10/16/2019 124* 70 - 99 mg/dL Final   Glucose reference range applies only to samples taken after fasting for at least 8 hours.  . BUN 10/16/2019 13  8 - 23 mg/dL Final  . Creatinine, Ser 10/16/2019 1.08* 0.44 - 1.00 mg/dL Final  . Calcium 10/16/2019 7.6* 8.9 - 10.3 mg/dL Final  . GFR calc non Af Amer 10/16/2019 49* >60 mL/min Final  . GFR calc Af Amer 10/16/2019 57* >60 mL/min Final  . Anion gap 10/16/2019 4* 5 - 15 Final   Performed at [facility], [place] [address] [place], [place] [address]  . WBC 10/17/2019 11.9* 4.0 - 10.5 K/uL Final  . RBC 10/17/2019 3.87  3.87 - 5.11 MIL/uL  Final  . Hemoglobin 10/17/2019 9.5* 12.0 - 15.0 g/dL Final  . HCT 10/17/2019 31.4* 36 - 46 % Final  . MCV 10/17/2019 81.1  80.0 - 100.0 fL Final  . MCH 10/17/2019 24.5* 26.0 - 34.0 pg Final  . MCHC 10/17/2019 30.3  30.0 - 36.0 g/dL Final  . RDW 10/17/2019 15.8* 11.5 - 15.5 % Final  . Platelets 10/17/2019 267  150 - 400 K/uL Final  . nRBC 10/17/2019 0.0  0.0 - 0.2 % Final   Performed at [facility], [place] [address], [place], [place] [address]  . Sodium 10/17/2019 133* 135 - 145 mmol/L Final  . Potassium 10/17/2019 4.4  3.5 - 5.1 mmol/L Final  . Chloride 10/17/2019 104  98 - 111 mmol/L Final  . CO2 10/17/2019 22  22 - 32 mmol/L Final  . Glucose, Bld 10/17/2019  132* 70 - 99 mg/dL Final   Glucose reference range applies only to samples taken after fasting for at least 8 hours.  . BUN 10/17/2019 17  8 - 23 mg/dL Final  . Creatinine, Ser 10/17/2019 1.27* 0.44 - 1.00 mg/dL Final  . Calcium 10/17/2019 8.3* 8.9 - 10.3 mg/dL Final  . GFR calc non Af Amer 10/17/2019 41* >60 mL/min Final  . GFR calc Af Amer 10/17/2019 47* >60 mL/min Final  . Anion gap 10/17/2019 7  5 - 15 Final   Performed at [facility], [place] [address], [place], [place] [address]  Hospital Outpatient Visit on 10/11/2019  Component Date Value Ref Range Status  . SARS Coronavirus 2 10/11/2019 NEGATIVE  NEGATIVE Final   Comment: (NOTE) SARS-CoV-2 target nucleic acids are NOT DETECTED.  The SARS-CoV-2 RNA is generally detectable in upper and lower respiratory specimens during the acute phase of infection. Negative results do not preclude SARS-CoV-2 infection, do not rule out co-infections with other pathogens, and should not be used as the sole basis for treatment or other patient management decisions. Negative results must be combined with clinical observations, patient history, and epidemiological information. The expected result is Negative.  Fact Sheet for Patients: SugarRoll.be  Fact Sheet for Healthcare Providers: https://www.woods-mathews.com/  This test is not yet approved or cleared by the [place] FDA and  has been authorized for detection and/or diagnosis of SARS-CoV-2 by FDA under an Emergency Use Authorization (EUA). This EUA will remain  in effect (meaning this test can be used) for the duration of the COVID-19 declaration under Se                          ction 564(b)(1) of the Act, 21 U.S.C. section 360bbb-3(b)(1), unless the authorization is terminated or revoked sooner.  Performed at [facility], [place] [address] [place], [place] [address]   Hospital Outpatient Visit on 10/05/2019   Component Date Value Ref Range Status  . aPTT 10/05/2019 33  24 - 36 seconds Final   Performed at [facility], [place] [address], [place], [place] [address]  . WBC 10/05/2019 7.3  4.0 - 10.5 K/uL Final  . RBC 10/05/2019 4.33  3.87 - 5.11 MIL/uL Final  . Hemoglobin 10/05/2019 10.8* 12.0 - 15.0 g/dL Final  . HCT 10/05/2019 35.8* 36 - 46 % Final  . MCV 10/05/2019 82.7  80.0 - 100.0 fL Final  . MCH 10/05/2019 24.9* 26.0 - 34.0 pg Final  . MCHC 10/05/2019 30.2  30.0 - 36.0 g/dL Final  . RDW 10/05/2019 15.7* 11.5 -  15.5 % Final  . Platelets 10/05/2019 291  150 - 400 K/uL Final  . nRBC 10/05/2019 0.0  0.0 - 0.2 % Final   Performed at [facility], [place] [address], [place], [place] [address]  . Sodium 10/05/2019 136  135 - 145 mmol/L Final  . Potassium 10/05/2019 4.4  3.5 - 5.1 mmol/L Final  . Chloride 10/05/2019 103  98 - 111 mmol/L Final  . CO2 10/05/2019 26  22 - 32 mmol/L Final  . Glucose, Bld 10/05/2019 103* 70 - 99 mg/dL Final   Glucose reference range applies only to samples taken after fasting for at least 8 hours.  . BUN 10/05/2019 19  8 - 23 mg/dL Final  . Creatinine, Ser 10/05/2019 1.14* 0.44 - 1.00 mg/dL Final  . Calcium 10/05/2019 8.8* 8.9 - 10.3 mg/dL Final  . Total Protein 10/05/2019 7.0  6.5 - 8.1 g/dL Final  . Albumin 10/05/2019 4.0  3.5 - 5.0 g/dL Final  . AST 10/05/2019 19  15 - 41 U/L Final  . ALT 10/05/2019 15  0 - 44 U/L Final  . Alkaline Phosphatase 10/05/2019 44  38 - 126 U/L Final  . Total Bilirubin 10/05/2019 0.6  0.3 - 1.2 mg/dL Final  . GFR calc non Af Amer 10/05/2019 46* >60 mL/min Final  . GFR calc Af Amer 10/05/2019 54* >60 mL/min Final  . Anion gap 10/05/2019 7  5 - 15 Final   Performed at [facility], [place] [address] [place], [place] [address]  . Prothrombin Time 10/05/2019 13.3  11.4 - 15.2 seconds Final  . INR 10/05/2019 1.1  0.8 - 1.2 Final   Comment: (NOTE) INR goal varies based on device and  disease states. Performed at [facility], [place] [address], [place], [place] [address]   . ABO/RH(D) 10/05/2019 B POS   Final  . Antibody Screen 10/05/2019 NEG   Final  . Sample Expiration 10/05/2019 10/18/2019,2359   Final  . Extend sample reason 10/05/2019    Final                   Value:NO TRANSFUSIONS OR PREGNANCY IN THE PAST 3 MONTHS Performed at [facility], [place] [address] [place], [place] [address]   . MRSA, PCR 10/05/2019 NEGATIVE  NEGATIVE Final  . Staphylococcus aureus 10/05/2019 NEGATIVE  NEGATIVE Final   Comment: (NOTE) The Xpert SA Assay (FDA approved for NASAL specimens in patients 58 years of age and older), is one component of a comprehensive surveillance program. It is not intended to diagnose infection nor to guide or monitor treatment. Performed at [facility], [place] [address], [place],  [address]      X-Rays:No results found.  EKG: Orders placed or performed during the hospital encounter of 06/09/19  . ED EKG  . ED EKG  . EKG 12-Lead  . EKG 12-Lead     Hospital Course: Glenda Terry is a 79 y.o. who was admitted to [facility]. They were brought to the operating room on 10/15/2019 and underwent Procedure(s): TOTAL KNEE ARTHROPLASTY.  Patient tolerated the procedure well and was later transferred to the recovery room and then to the orthopaedic floor for postoperative care. They were given PO and IV analgesics for pain control following their surgery. They were given 24 hours of postoperative antibiotics of  Anti-infectives (From admission, onward)   Start     Dose/Rate Route Frequency Ordered Stop   10/15/19 1800  vancomycin (VANCOCIN) IVPB 1000 mg/200 mL premix        1,000 mg 200 mL/hr over 60 Minutes Intravenous Every 12 hours 10/15/19 1048 10/15/19 1806   10/15/19 0600  vancomycin (VANCOCIN) IVPB 1000 mg/200 mL premix        1,000 mg 200 mL/hr over 60 Minutes Intravenous On  call to O.R. 10/15/19 9381 10/15/19 0740     and started on DVT prophylaxis in the form of Xarelto.   PT and OT were ordered for total joint protocol. Discharge planning consulted to help with postop disposition and equipment needs. Patient had a good night on the evening of surgery. They started to get up OOB with therapy on POD #0. Continued to work with therapy into POD #2. Pt was seen during rounds on day two and was ready for discharge pending bed availability. Dressing was changed and the incision was clean, dry, and intact. Pt worked with therapy andwas meeting their goals. She was discharged to SNF later that day in stable condition.  The PDMP database was reviewed today (10/17/2019) prior to any opioid medications being prescribed to this patient.   Diet: Regular diet Activity: WBAT Follow-up: in 2 weeks Disposition: Skilled nursing facility Discharged Condition: stable   Discharge Instructions    Call MD / Call 911   Complete by: As directed    If you experience chest pain or shortness of breath, CALL 911 and be transported to the hospital emergency room.  If you develope a fever above 101 F, pus (white drainage) or increased drainage or redness at the wound, or calf pain, call your surgeon's office.   Change dressing   Complete by: As directed    You may remove the bulky bandage (ACE wrap and gauze) two days after surgery. You will have an adhesive waterproof bandage underneath. Leave this in place until your first follow-up appointment.   Constipation Prevention   Complete by: As directed    Drink plenty of fluids.  Prune juice may be helpful.  You may use a stool softener, such as Colace (over the counter) 100 mg twice a day.  Use MiraLax (over the counter) for constipation as needed.   Diet - low sodium heart healthy   Complete by: As directed    Do not put a pillow under the knee. Place it under the heel.   Complete by: As directed    Driving restrictions   Complete by: As  directed    No driving for two weeks   TED hose   Complete by: As directed    Use stockings (TED hose) for three weeks on both leg(s).  You may remove them at night for sleeping.   Weight bearing as tolerated   Complete by: As directed      Allergies as of 10/17/2019      Reactions   Flexeril [cyclobenzaprine] Anaphylaxis   Other Anaphylaxis, Other (See Comments)   ALL TREE NUTS   Keflex [cephalexin] Itching   Aspirin Other (See Comments)   Cannot take due to ulcers   Statins Swelling, Other (See Comments)   Pain   Adhesive [tape] Rash   Chlorhexidine Rash      Medication List    STOP taking these medications   ascorbic acid 500 MG tablet Commonly known as: VITAMIN C   Magnesium 250 MG Tabs   oxyCODONE-acetaminophen 10-325 MG tablet Commonly known as: PERCOCET   Vitamin D3 50 MCG (2000 UT) Tabs     TAKE these  medications   ALPRAZolam 0.5 MG tablet Commonly known as: XANAX Take 0.25-0.5 mg by mouth 2 (two) times daily as needed for anxiety.   denosumab 60 MG/ML Sosy injection Commonly known as: PROLIA Inject 60 mg into the skin every 6 (six) months.   [facility] person With tubing and supplies to be used with nebulized medications. Pt's nebulizer is over 75 years old and it is broken.  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 In the afternoon   Dulera 200-5 MCG/ACT Aero Generic drug: mometasone-formoterol Inhale 2 puffs into the lungs 2 (two) times daily.   EpiPen 2-Pak 0.3 mg/0.3 mL Soaj injection Generic drug: EPINEPHrine Inject 0.3 mg into the muscle once.   esomeprazole 40 MG capsule Commonly known as: NEXIUM TAKE 1 CAPSULE BY MOUTH TWICE DAILY. What changed: when to take this   famotidine 20 MG tablet Commonly known as: PEPCID Take 20 mg by mouth at bedtime.   Fasenra 30 MG/ML Sosy Generic drug: Benralizumab Inject 30 mg into the  skin every 8 (eight) weeks.   ferrous sulfate 325 (65 FE) MG tablet Take 325 mg by mouth daily with breakfast.   fexofenadine 180 MG tablet Commonly known as: ALLEGRA Take 180 mg by mouth daily.   fluticasone 50 MCG/ACT nasal spray Commonly known as: FLONASE Place 1-2 sprays into both nostrils daily as needed for allergies or rhinitis.   ketotifen 0.025 % ophthalmic solution Commonly known as: ZADITOR Place 1 drop into both eyes 2 (two) times daily as needed (allergies).   Linzess 290 MCG Caps capsule Generic drug: linaclotide TAKE 1 CAPSULE BY MOUTH DAILY BEFORE BREAKFAST What changed: See the new instructions.   methocarbamol 500 MG tablet Commonly known as: ROBAXIN Take 1 tablet (500 mg total) by mouth every 6 (six) hours as needed for muscle spasms.   montelukast 10 MG tablet Commonly known as: SINGULAIR Take 10 mg by mouth at bedtime.   mupirocin ointment 2 % Commonly known as: BACTROBAN Apply 1 application topically 2 (two) times daily as needed (skin irritation/wound care).   olmesartan 40 MG tablet Commonly known as: BENICAR Take 40 mg by mouth daily.   oxyCODONE 5 MG immediate release tablet Commonly known as: Oxy IR/ROXICODONE Take 2-4 tablets (10-20 mg total) by mouth every 6 (six) hours as needed for severe pain.   polyethylene glycol 17 g packet Commonly known as: MIRALAX / GLYCOLAX Take 17 g by mouth daily as needed (constipation.).   potassium chloride 10 MEQ tablet Commonly known as: KLOR-CON Take 10 mEq by mouth every other day. IN THE MORNING   ProAir HFA 108 (90 Base) MCG/ACT inhaler Generic drug: albuterol Inhale 2 puffs into the lungs every 6 (six) hours as needed for wheezing or shortness of breath (For COPD).   rivaroxaban 10 MG Tabs tablet Commonly known as: XARELTO Take 1 tablet (10 mg total) by mouth daily with breakfast for 19 days. Then take one 81 mg aspirin once a day for three weeks. Then discontinue aspirin.   SYSTANE OP Place  1 drop into both eyes 3 (three) times daily as needed (dry/irritated eyes.).   torsemide 10 MG tablet Commonly known as: DEMADEX Take 10 mg by mouth every other day. IN THE MORNING   traMADol 50 MG tablet Commonly known as: ULTRAM Take 1-2 tablets (50-100 mg total) by mouth every 6 (six) hours as needed for moderate pain.  Discharge Care Instructions  (From admission, onward)         Start     Ordered   10/17/19 0000  Weight bearing as tolerated        10/17/19 0749   10/17/19 0000  Change dressing       Comments: You may remove the bulky bandage (ACE wrap and gauze) two days after surgery. You will have an adhesive waterproof bandage underneath. Leave this in place until your first follow-up appointment.   10/17/19 0749          Follow-up Information    Gaynelle Arabian, MD. Schedule an appointment as soon as possible for a visit on 10/30/2019.   Specialty: Orthopedic Surgery Contact information: [address] [place] [address] [phone]               Signed: Theresa Duty, PA-C Orthopedic Surgery 10/17/2019, 7:49 AM

## 2019-10-17 NOTE — TOC Transition Note (Signed)
Transition of Care [facility]) - CM/SW Discharge Note   Patient Details  Name: Glenda Terry MRN: [id number] Date of Birth: 10-13-1940  [facility] CM/SW Contact:  Lennart Pall, LCSW Phone Number: 10/17/2019, 11:30 AM   Clinical Narrative:    Have received SNF bed offer and insurance authorization today.  Pt to transfer to [facility] via [facility].  No further TOC needs.   Final next level of care: [place] Barriers to Discharge: Barriers Resolved   Patient Goals and CMS Choice Patient states their goals for this hospitalization and ongoing recovery are:: "go to [facility] and then go home"      Discharge Placement   Existing PASRR number confirmed : 10/16/19          Patient chooses bed at: [facility] Patient to be transferred to facility by: [place] Name of family member notified: pt to notify family Patient and family notified of of transfer: 10/17/19  Discharge Plan and Services In-house Referral: Clinical Social Work   Post Acute Care Choice: [place]          DME Arranged: N/A DME Agency: NA       HH Arranged: NA [place] Agency: NA        Social Determinants of Health ([place]) Interventions     Readmission Risk Interventions Readmission Risk Prevention Plan 10/17/2019  Transportation Screening Complete  PCP or Specialist Appt within 5-7 Days Complete  Home Care Screening Complete  Medication Review (RN CM) Complete  Some recent data might be hidden

## 2019-10-17 NOTE — Progress Notes (Signed)
Physical Therapy Treatment Patient Details Name: Glenda Terry MRN: [id number] DOB: Aug 18, 1940 Today's Date: 10/17/2019    History of Present Illness s/p L TKA. PMH: CKD, HTN, arthritis, bil reverse TSA, R TKA, Spinal stenosis, back pain, Cushing's Syndrome    PT Comments    POD # 2 am session Pt OOB in recliner.  Assisted with amb in hallway.  General Gait Details: decreased amb distance due to increased pain.  Applied KI for increased support and B shoes as well. Then returned to room to perform some TE's following HEP handout.  Instructed on proper tech, freq as well as use of ICE.   Pt lives home alone and plans to D/C SNF for ST Rehab.  Follow Up Recommendations  SNF     Equipment Recommendations  None recommended by PT    Recommendations for Other Services       Precautions / Restrictions Precautions Precautions: Fall;Knee Precaution Booklet Issued: No Precaution Comments: WBAT Required Braces or Orthoses: Knee Immobilizer - Left Knee Immobilizer - Left: Discontinue once straight leg raise with < 10 degree lag Restrictions Weight Bearing Restrictions: No Other Position/Activity Restrictions: WBAT    Mobility  Bed Mobility               General bed mobility comments: OOB in recliner  Transfers Overall transfer level: Needs assistance Equipment used: Rolling walker (2 wheeled) Transfers: Sit to/from Stand Sit to Stand: Min assist         General transfer comment: cues for hand placement and LLE position plus increased time  Ambulation/Gait Ambulation/Gait assistance: Min assist;Min guard Gait Distance (Feet): 65 Feet Assistive device: Rolling walker (2 wheeled) Gait Pattern/deviations: Step-to pattern;Decreased stance time - left;Antalgic Gait velocity: decreased   General Gait Details: decreased amb distance due to increased pain.  Applied KI for increased support and B shoes as well.   Stairs             Wheelchair Mobility     Modified Rankin (Stroke Patients Only)       Balance                                            Cognition Arousal/Alertness: Awake/alert Behavior During Therapy: WFL for tasks assessed/performed Overall Cognitive Status: Within Functional Limits for tasks assessed                                 General Comments: AxO x 3 pleasant      Exercises   Total Knee Replacement TE's following HEP handout 10 reps B LE ankle pumps 05 reps towel squeezes 05 reps knee presses 05 reps heel slides  05 reps SAQ's 05 reps SLR's 05 reps ABD Educated on use of gait belt to assist with TE's Followed by ICE     General Comments        Pertinent Vitals/Pain Pain Assessment: 0-10 Pain Score: 6  Pain Location: left knee Pain Descriptors / Indicators: Grimacing;Guarding;Operative site guarding Pain Intervention(s): Monitored during session;Premedicated before session;Repositioned;Ice applied    Home Living                      Prior Function            PT Goals (current goals can now be found in the care  plan section) Progress towards PT goals: Progressing toward goals    Frequency    7X/week      PT Plan Current plan remains appropriate    Co-evaluation              AM-PAC PT "6 Clicks" Mobility   Outcome Measure  Help needed turning from your back to your side while in a flat bed without using bedrails?: A Little Help needed moving from lying on your back to sitting on the side of a flat bed without using bedrails?: A Little Help needed moving to and from a bed to a chair (including a wheelchair)?: A Little Help needed standing up from a chair using your arms (e.g., wheelchair or bedside chair)?: A Little Help needed to walk in hospital room?: A Little Help needed climbing 3-5 steps with a railing? : A Lot 6 Click Score: 17    End of Session Equipment Utilized During Treatment: Gait belt;Left knee  immobilizer Activity Tolerance: Patient tolerated treatment well Patient left: with call bell/phone within reach;with bed alarm set;in chair   PT Visit Diagnosis: Difficulty in walking, not elsewhere classified (R26.2)     Time: 3361-2244 PT Time Calculation (min) (ACUTE ONLY): 35 min  Charges:  $Gait Training: 8-22 mins $Therapeutic Exercise: 8-22 mins                     Rica Koyanagi  PTA Acute  Rehabilitation Services Pager      [phone] Office      [phone]

## 2019-10-18 ENCOUNTER — Other Ambulatory Visit: Payer: Self-pay | Admitting: Adult Health

## 2019-10-18 ENCOUNTER — Encounter: Payer: Self-pay | Admitting: Internal Medicine

## 2019-10-18 ENCOUNTER — Non-Acute Institutional Stay (SKILLED_NURSING_FACILITY): Payer: PPO | Admitting: Internal Medicine

## 2019-10-18 DIAGNOSIS — I10 Essential (primary) hypertension: Secondary | ICD-10-CM | POA: Diagnosis not present

## 2019-10-18 DIAGNOSIS — R000 Tachycardia, unspecified: Secondary | ICD-10-CM | POA: Insufficient documentation

## 2019-10-18 DIAGNOSIS — M1712 Unilateral primary osteoarthritis, left knee: Secondary | ICD-10-CM | POA: Diagnosis not present

## 2019-10-18 MED ORDER — ALPRAZOLAM 0.5 MG PO TABS: 0.2500 mg | ORAL_TABLET | Freq: Two times a day (BID) | ORAL | 0 refills | Status: DC | PRN

## 2019-10-18 NOTE — Assessment & Plan Note (Signed)
10/18/2019 she exhibits tachycardia at rest which she attributes to anxiety.  There is no history to suggest pheochromocytoma.  TFTs will be updated.  Selective beta-blocker will be initiated and titrated as indicated.

## 2019-10-18 NOTE — Patient Instructions (Signed)
See assessment and plan under each diagnosis in the problem list and acutely for this visit 

## 2019-10-18 NOTE — Progress Notes (Signed)
NURSING HOME LOCATION:  [facility]  ROOM NUMBER:  133/P  CODE STATUS:    PCP:  Celene Squibb, MD  This is a comprehensive admission note to [facility] performed on this date less than 30 days from date of admission. Included are preadmission medical/surgical history; reconciled medication list; family history; social history and comprehensive review of systems.  Corrections and additions to the records were documented. Comprehensive physical exam was also performed. Additionally a clinical summary was entered for each active diagnosis pertinent to this admission in the Problem List to enhance continuity of care.  HPI: Patient was hospitalized 8/2-10/17/2019 for left total knee arthroplasty for end-stage osteoarthritis by Dr Gaynelle Arabian.  Surgery was necessary because of constant pain with significant functional deficits impacting even ADLs.  Nonoperative management had included analgesics, steroid injections, and viscosupplements.  Imaging had revealed bone-on-bone arthritis medially and patellofemorally. Xarelto was prescribed for DVT prophylaxis.  She received PT/OT postop.  WBAT was prescribed with orthopedic follow-up in 2 weeks.  She was discharged to SNF for ongoing rehab.  Past medical and surgical history: Includes history of colon polyps, spinal stenosis, history of asthma, pulmonary eosinophilia, iron deficiency anemia, GERD, essential hypertension, history of Cushing syndrome, COPD, osteoporosis, and CKD stage III. Surgeries and procedures include TAH, lumbar laminectomy, TKA, and bilateral shoulder arthroplasty.  Social history: Nondrinker, former 80-pack-year smoker.  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  She reads and does crossword puzzles.  Family history: Reviewed.   Review of systems: She calmly stated without significant emotion "I am in pain".  She went on to say that her anxiety was up resulting in heart fluttering.  She said  that this was causing "smothering".  She denies any tremor or diarrhea with anxiety episodes.  The last TSH in [facility] was in 2017. She describes intermittent slight carpal tunnel syndrome symptoms in the left upper extremity for which she will use a brace as needed. 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  Constitutional: No fever, significant weight change, fatigue  Eyes: No redness, discharge, pain, vision change ENT/mouth: No nasal congestion, purulent discharge, earache, change in hearing, sore throat  Cardiovascular: No chest pain, paroxysmal nocturnal dyspnea, claudication, edema  Respiratory: No cough, sputum production, hemoptysis, significant snoring, apnea  Gastrointestinal: No heartburn, dysphagia, abdominal pain, nausea /vomiting, rectal bleeding, melena, change in bowels Genitourinary: No dysuria, hematuria, pyuria, incontinence, nocturia Dermatologic: No rash, pruritus, change in appearance of skin Neurologic: No dizziness, headache, syncope, seizures Psychiatric: No significant depression, insomnia, anorexia Endocrine: No change in hair/skin/nails, excessive thirst, excessive hunger, excessive urination  Hematologic/lymphatic: No significant bruising, lymphadenopathy, abnormal bleeding Allergy/immunology: No itchy/watery eyes, significant sneezing, urticaria, angioedema  Physical exam:  Pertinent or positive findings: She is alert and oriented.  There is discordance between her statements of anxiety and her flat affect. Superficially she appears calm and collected. She is morbidly obese. Lacrimal glands are prominent.  She is wearing only the upper plate.  She states that gum recession prevents her using a lower plate.   Indeed she does have slight tachycardia which is regular.  Chest was surprisingly clear although breath sounds were slightly decreased.  She also exhibited some pursed lip breathing  intermittently.  Pedal pulses were decreased but palpable.  TED hose present on the right leg and brace on the left.  Fusiform enlargement of the right knee is visible.  There is also an operative scar of R knee which is well-healed.  She has slight clubbing of the nailbeds.  She has splotchy hyperpigmentation over the upper lateral cheeks.  General appearance: Adequately nourished; no acute distress, increased work of breathing is present despite pursed lip breathng pattern intermittently.   Lymphatic: No lymphadenopathy about the head, neck, axilla. Eyes: No conjunctival inflammation or lid edema is present. There is no scleral icterus. Ears:  External ear exam shows no significant lesions or deformities.   Nose:  External nasal examination shows no deformity or inflammation. Nasal mucosa are pink and moist without lesions, exudates Oral exam: Lips and gums are healthy appearing.There is no oropharyngeal erythema or exudate. Neck:  No thyromegaly, masses, tenderness noted.    Heart:  No gallop, murmur, click, rub.  Lungs:  without wheezes, rhonchi, rales, rubs. Abdomen: Bowel sounds are normal.  Abdomen is soft and nontender with no organomegaly, hernias, masses. GU: Deferred  Extremities:  No cyanosis, edema. Neurologic exam:  Balance, Rhomberg, finger to nose testing could not be completed due to clinical state Skin: Warm & dry w/o tenting.  See clinical summary under each active problem in the Problem List with associated updated therapeutic plan

## 2019-10-18 NOTE — Assessment & Plan Note (Signed)
WBAT PT/OT at [facility]

## 2019-10-18 NOTE — Assessment & Plan Note (Signed)
Mild hypertension is present.  With elevated blood pressure and tachycardia, selective beta-blocker therapy will be initiated.

## 2019-10-19 ENCOUNTER — Non-Acute Institutional Stay (SKILLED_NURSING_FACILITY): Payer: PPO | Admitting: Adult Health

## 2019-10-19 ENCOUNTER — Other Ambulatory Visit (HOSPITAL_COMMUNITY)
Admission: RE | Admit: 2019-10-19 | Discharge: 2019-10-19 | Disposition: A | Discharge status: Home / Self Care | Payer: PPO | Source: Ambulatory Visit | Attending: Internal Medicine | Admitting: Internal Medicine

## 2019-10-19 ENCOUNTER — Encounter: Payer: Self-pay | Admitting: Adult Health

## 2019-10-19 DIAGNOSIS — Z471 Aftercare following joint replacement surgery: Secondary | ICD-10-CM | POA: Insufficient documentation

## 2019-10-19 DIAGNOSIS — R000 Tachycardia, unspecified: Secondary | ICD-10-CM | POA: Diagnosis not present

## 2019-10-19 DIAGNOSIS — J4550 Severe persistent asthma, uncomplicated: Secondary | ICD-10-CM | POA: Diagnosis not present

## 2019-10-19 DIAGNOSIS — M810 Age-related osteoporosis without current pathological fracture: Secondary | ICD-10-CM | POA: Diagnosis not present

## 2019-10-19 DIAGNOSIS — M1712 Unilateral primary osteoarthritis, left knee: Secondary | ICD-10-CM

## 2019-10-19 DIAGNOSIS — R600 Localized edema: Secondary | ICD-10-CM | POA: Diagnosis not present

## 2019-10-19 DIAGNOSIS — K219 Gastro-esophageal reflux disease without esophagitis: Secondary | ICD-10-CM | POA: Diagnosis not present

## 2019-10-19 DIAGNOSIS — D649 Anemia, unspecified: Secondary | ICD-10-CM

## 2019-10-19 DIAGNOSIS — K5904 Chronic idiopathic constipation: Secondary | ICD-10-CM | POA: Diagnosis not present

## 2019-10-19 DIAGNOSIS — J449 Chronic obstructive pulmonary disease, unspecified: Secondary | ICD-10-CM | POA: Diagnosis not present

## 2019-10-19 DIAGNOSIS — I10 Essential (primary) hypertension: Secondary | ICD-10-CM | POA: Diagnosis not present

## 2019-10-19 LAB — TSH: TSH: 0.947 u[IU]/mL (ref 0.350–4.500)

## 2019-10-19 LAB — T4, FREE: Free T4: 1.24 ng/dL — ABNORMAL HIGH (ref 0.61–1.12)

## 2019-10-19 NOTE — Progress Notes (Signed)
Location:    [place] Room Number: 133/P Place of Service:  SNF (31)   CODE STATUS: Full Code  Allergies  Allergen Reactions  . Flexeril [Cyclobenzaprine] Anaphylaxis  . Other Anaphylaxis and Other (See Comments)    ALL TREE NUTS  . Keflex [Cephalexin] Itching  . Aspirin Other (See Comments)    Cannot take due to ulcers  . Statins Swelling and Other (See Comments)    Pain  . Adhesive [Tape] Rash  . Chlorhexidine Rash    Chief Complaint  Patient presents with  . Acute Visit    Tachycardia     HPI:  She has had tachycardia with palpitations. She was started on lopressor 25 mg twice daily yesterday. Today her apical pulse is 78. She states that she feels palpitations since starting the xarelto. She will be on this medication for total of 19 days. Her EKG show NSR. She denies any shortness of breath. She does have chronic anxiety.    Past Medical History:  Diagnosis Date  . Anxiety   . Asthma   . Back pain, chronic   . CKD (chronic kidney disease) stage 3, GFR 30-59 ml/min   . Compression fx, thoracic spine (HCC)    T - 11  . COPD (chronic obstructive pulmonary disease) ([place]) 12/16/2015  . 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[place])   . Essential hypertension   . GERD (gastroesophageal reflux disease)   . HOH (hard of hearing)   . Inflammatory arthritis   . Iron deficiency anemia 01/2012  . Pulmonary eosinophilia   . Spinal stenosis   . Tubular adenoma 11/2012    Past Surgical History:  Procedure Laterality Date  . ABDOMINAL HYSTERECTOMY    . BACK SURGERY    . CARPAL TUNNEL RELEASE Right 4/14  . CATARACT EXTRACTION W/PHACO Left 11/19/2014   Procedure: CATARACT EXTRACTION PHACO AND INTRAOCULAR LENS PLACEMENT (IOC);  Surgeon: Williams Che, MD;  Location: [facility];  Service: Ophthalmology;  Laterality: Left;  CDE: 4.77  . CATARACT EXTRACTION W/PHACO Right 02/03/2015   Procedure: CATARACT EXTRACTION PHACO AND INTRAOCULAR LENS PLACEMENT ([place]);  Surgeon: Williams Che, MD;  Location: [facility];  Service: Ophthalmology;  Laterality: Right;  CDE: 4.54  . COLONOSCOPY  06/19/2008   RMR: tortuous and elongated colon with scattered left-sided diverticula/colonic mucosa appeared entirely normal. Prior colonic ulcers had resolved.  . COLONOSCOPY  12/2007   Dr. Gala Romney: Scattered diffuse sigmoid diverticula, 2 areas of ulceration at the hepatic flexure. Biopsies unremarkable.  . COLONOSCOPY N/A 11/20/2012   next TCS 11/2017  . COLONOSCOPY WITH PROPOFOL N/A 12/22/2017   Procedure: COLONOSCOPY WITH PROPOFOL;  Surgeon: Daneil Dolin, MD;  Location: [facility];  Service: Endoscopy;  Laterality: N/A;  7:30am  . DECOMPRESSIVE LUMBAR LAMINECTOMY LEVEL 2  04/20/2012   Procedure: DECOMPRESSIVE LUMBAR LAMINECTOMY LEVEL 2;  Surgeon: Tobi Bastos, MD;  Location: [facility];  Service: Orthopedics;  Laterality: Right;  Decompressive Lumbar Laminectomy of the L4 - L5 and L5 - S1 Complete/Laminectomy L5 on the Right (X-Ray)  . ESOPHAGOGASTRODUODENOSCOPY  12/2007   Dr. 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 Small hiatal hernia. Slightly pale duodenal mucosa (biopsy unremarkable)  . ESOPHAGOGASTRODUODENOSCOPY (EGD) WITH PROPOFOL N/A 01/20/2017   Dr. Gala Romney: Medium sized hiatal hernia empiric esophageal dilation for history of dysphagia  . EYE SURGERY     BIL CATARACTS  . IR KYPHO THORACIC WITH BONE BIOPSY  02/08/2017  . IR RADIOLOGIST EVAL &  MGMT  02/02/2017  . JOINT REPLACEMENT    . KNEE ARTHROSCOPY Right   . MALONEY DILATION N/A 01/20/2017   Procedure: Venia Minks DILATION;  Surgeon: Daneil Dolin, MD;  [facility];  Service: Endoscopy;  Laterality: N/A;  . POLYPECTOMY  12/22/2017   Procedure: POLYPECTOMY;  Surgeon: Daneil Dolin, MD;  [facility];  Service: Endoscopy;;  . REVERSE SHOULDER ARTHROPLASTY Right 05/24/2014   Procedure: RIGHT SHOULDER REVERSE ARTHROPLASTY;  Surgeon: Netta Cedars, MD;   Location: [facility];  Service: Orthopedics;  Laterality: Right;  . REVERSE SHOULDER ARTHROPLASTY Left 06/10/2017   Procedure: LEFT REVERSE SHOULDER ARTHROPLASTY;  Surgeon: Netta Cedars, MD;  Location: [facility];  Service: Orthopedics;  Laterality: Left;  . TOTAL KNEE ARTHROPLASTY  01/24/2012   Procedure: TOTAL KNEE ARTHROPLASTY;  Surgeon: Gearlean Alf, MD;  Location: [facility];  Service: Orthopedics;  Laterality: Right;  . TOTAL KNEE ARTHROPLASTY Left 10/15/2019   Procedure: TOTAL KNEE ARTHROPLASTY;  Surgeon: Gaynelle Arabian, MD;  Location: [facility];  Service: Orthopedics;  Laterality: Left;  76min  . TUBAL LIGATION      Social History   Socioeconomic History  . Marital status: Single    Spouse name: Not on file  . Number of children: 3  . Years of education: Not on file  . Highest education level: Not on file  Occupational History  . Not on file  Tobacco Use  . Smoking status: Former Smoker    Packs/day: 2.00    Years: 40.00    Pack years: 80.00    Types: Cigarettes    Quit date: 01/19/1992    Years since quitting: 27.7  . Smokeless tobacco: Never Used  Vaping Use  . Vaping Use: Never used  Substance and Sexual Activity  . Alcohol use: No  . Drug use: No  . Sexual activity: Never  Other Topics Concern  . Not on file  Social History Narrative  . Not on file   Social Determinants of Health   Financial Resource Strain:   . Difficulty of Paying Living Expenses:   Food Insecurity:   . Worried About Charity fundraiser in the Last Year:   . Arboriculturist in the Last Year:   Transportation Needs:   . Film/video editor (Medical):   Marland Kitchen Lack of Transportation (Non-Medical):   Physical Activity:   . Days of Exercise per Week:   . Minutes of Exercise per Session:   Stress:   . Feeling of Stress :   Social Connections:   . Frequency of Communication with Friends and Family:   . Frequency of Social Gatherings with Friends and Family:   . Attends Religious Services:   . Active  Member of Clubs or Organizations:   . Attends Archivist Meetings:   Marland Kitchen Marital Status:   Intimate Partner Violence:   . Fear of Current or Ex-Partner:   . Emotionally Abused:   Marland Kitchen Physically Abused:   . Sexually Abused:    Family History  Problem Relation Age of Onset  . Breast cancer Sister   . Colon cancer Sister        two deceased, one living and terminal, one current undergoing treatment, ages 62, 61, 69, 25  . Hypertension Mother   . Stroke Mother   . Heart attack Mother   . Hypertension Father   . Prostate cancer Father       VITAL SIGNS BP 102/64   Pulse (!) 114  Temp (!) 97.4 F (36.3 C) (Oral)   Resp 20   Ht 5' (1.524 m)   Wt 201 lb 9.6 oz (91.4 kg)   BMI 39.37 kg/m   Outpatient Encounter Medications as of 10/19/2019  Medication Sig  . albuterol (PROAIR HFA) 108 (90 BASE) MCG/ACT inhaler Inhale 2 puffs into the lungs every 6 (six) hours as needed for wheezing or shortness of breath (For COPD).   Marland Kitchen ALPRAZolam (XANAX) 0.5 MG tablet Take 0.5 tablets (0.25 mg total) by mouth 2 (two) times daily as needed for up to 12 days for anxiety.  Derrill Memo ON 11/06/2019] aspirin 81 MG chewable tablet Chew 81 mg by mouth daily. Special Instructions: Start after completion of xarelto and take x 3 weeks and then d/c.  Marland Kitchen Benralizumab (FASENRA) 30 MG/ML SOSY Inject 30 mg into the skin every 8 (eight) weeks.   Marland Kitchen denosumab (PROLIA) 60 MG/ML SOSY injection Inject 60 mg into the skin every 6 (six) months.  . docusate sodium (COLACE) 100 MG capsule Take 100 mg by mouth every other day. In the afternoon  . DULERA 200-5 MCG/ACT AERO Inhale 2 puffs into the lungs 2 (two) times daily.  Marland Kitchen esomeprazole (NEXIUM) 40 MG capsule TAKE 1 CAPSULE BY MOUTH TWICE DAILY.  . ferrous sulfate 325 (65 FE) MG tablet Take 325 mg by mouth daily with breakfast.  . fexofenadine (ALLEGRA) 180 MG tablet Take 180 mg by mouth daily.  . fluticasone (FLONASE) 50 MCG/ACT nasal spray Place 1 spray into both  nostrils daily as needed for allergies or rhinitis.   Marland Kitchen ketotifen (ZADITOR) 0.025 % ophthalmic solution Place 1 drop into both eyes 2 (two) times daily as needed (allergies).   Marland Kitchen LINZESS 290 MCG CAPS capsule TAKE 1 CAPSULE BY MOUTH DAILY BEFORE BREAKFAST  . methocarbamol (ROBAXIN) 500 MG tablet Take 1 tablet (500 mg total) by mouth every 6 (six) hours as needed for muscle spasms.  . metoprolol tartrate (LOPRESSOR) 25 MG tablet Take 25 mg by mouth 2 (two) times daily.  . montelukast (SINGULAIR) 10 MG tablet Take 10 mg by mouth at bedtime.   Marland Kitchen olmesartan (BENICAR) 40 MG tablet Take 40 mg by mouth daily.  Marland Kitchen oxyCODONE (OXY IR/ROXICODONE) 5 MG immediate release tablet Take 5 mg by mouth every 6 (six) hours as needed for severe pain. Special Instructions: As needed for pain rated 9-10/10 thru 10/24/19 & then provider to reassess need.  Vladimir Faster Glycol-Propyl Glycol (SYSTANE OP) Place 1 drop into both eyes 3 (three) times daily as needed (dry/irritated eyes.).   Marland Kitchen polyethylene glycol (MIRALAX / GLYCOLAX) packet Take 17 g by mouth daily as needed (constipation.).   Marland Kitchen potassium chloride (K-DUR) 10 MEQ tablet Take 10 mEq by mouth every other day. IN THE MORNING  . rivaroxaban (XARELTO) 10 MG TABS tablet Take 1 tablet (10 mg total) by mouth daily with breakfast for 19 days. Then take one 81 mg aspirin once a day for three weeks. Then discontinue aspirin.  . torsemide (DEMADEX) 10 MG tablet Take 10 mg by mouth every other day. IN THE MORNING  . traMADol (ULTRAM) 50 MG tablet Take 50 mg by mouth every 6 (six) hours as needed. Special Instructions: As needed for pain rated 7-9 out of 10 thru 10/24/19.  Marland Kitchen EPINEPHrine (EPIPEN 2-PAK) 0.3 mg/0.3 mL IJ SOAJ injection Inject 0.3 mg into the muscle once.  (Patient not taking: Reported on 10/19/2019)  . mupirocin ointment (BACTROBAN) 2 % Apply 1 application topically 2 (two) times  daily as needed (skin irritation/wound care).  (Patient not taking: Reported on 10/19/2019)  .  Nebulizers (DEVILBISS TRAVELER NEBULIZER) MISC With tubing and supplies to be used with nebulized medications. Pt's nebulizer is over 36 years old and it is broken.  J45.50 Severe persistent asthma, J44.9 COPD (Patient not taking: Reported on 10/19/2019)   No facility-administered encounter medications on file as of 10/19/2019.     SIGNIFICANT DIAGNOSTIC EXAMS  LABS REVIEWED TODAY  10-16-19: wbc 9.9; hgb 9.4; hct 30.9; mcv 82.2 plt 273; glucose 124; bun 13; creat 1.08; k+ 4.9; na++ 135; ca 7.6 10-17-19: wbc 11.9; hgb 9.5; hct 31.4; mcv 81.1 plt 267; glucose 132; bun 17; creat 1.27; k+ 4.4; na++ 133; ca 8.3  10-19-19: free T4: 1.24 free T3: 3.1 tsh 0.947   Review of Systems  Constitutional: Negative for malaise/fatigue.  Respiratory: Negative for cough and shortness of breath.   Cardiovascular: Positive for palpitations. Negative for chest pain and leg swelling.  Gastrointestinal: Negative for abdominal pain, constipation and heartburn.  Musculoskeletal: Negative for back pain, joint pain and myalgias.  Skin: Negative.   Neurological: Negative for dizziness.  Psychiatric/Behavioral: The patient is not nervous/anxious.    Physical Exam Constitutional:      General: She is not in acute distress.    Appearance: She is well-developed. She is obese. She is not diaphoretic.  Neck:     Thyroid: No thyromegaly.  Cardiovascular:     Rate and Rhythm: Normal rate and regular rhythm.     Pulses: Normal pulses.     Heart sounds: Normal heart sounds.  Pulmonary:     Effort: Pulmonary effort is normal. No respiratory distress.     Breath sounds: Normal breath sounds.  Abdominal:     General: Bowel sounds are normal. There is no distension.     Palpations: Abdomen is soft.     Tenderness: There is no abdominal tenderness.  Musculoskeletal:     Cervical back: Neck supple.     Right lower leg: No edema.     Left lower leg: No edema.     Comments: Is able to move all extremities Is status post left  knee replacement   Lymphadenopathy:     Cervical: No cervical adenopathy.  Skin:    General: Skin is warm and dry.     Comments: Incision line without signs of infection present   Neurological:     Mental Status: She is alert and oriented to person, place, and time.  Psychiatric:        Mood and Affect: Mood normal.        ASSESSMENT/ PLAN:  TODAY  1. Primary osteoarthritis left knee: is status post left knee replacement: will continue therapy as directed and will follow up with orthopedics.  Will continue oxycodone 5 mg every 6 hours as needed through 10-24-19; robaxin 500 mg every 6 hours as needed will continue xarelto 10 mg daily for 19 days then asa 81 mg daily for 3 weeks.   2. Tachycardia: apical pulse today is 78; will continue lopressor 25 mg twice daily   3. Essential hypertension is stable b/p 102/64 will continue benicar 40 mg daily   4. Severe persistent asthma unspecified whether complicated/ chronic obstructive pulmonary disease unspecified COPD type: is stable will continue fasenra 30 mg every 8 weeks; dulera 200-5 mcg 2 puffs twice daily allegra 180 mg daily flonase daily as needed; singulair 10 mg daily   5. Gastroesophageal reflux disease without esophagitis: is stable will  continue nexium 40 mg twice daily   6. Chronic idiopathic constipation: is stable will continue colace every other day; linzess 290 mcg daily   7. Bilateral lower extremity edema: is stable will continue demadex 10 mg and k+ 10 meq every other day  8. Chronic anemia: is stable hgb 9.5 will continue iron daily   9. Post menopausal osteoporosis: is stable will continue prolia 60 mg every 6 months  10. Anxiety disorder: is stable will continue xanax 0.25 mg twice daily as needed       MD is aware of resident's narcotic use and is in agreement with current plan of care. We will attempt to wean resident as appropriate.  Ok Edwards NP [facility]  Contact [phone]  Monday through Friday 8am- 5pm  After hours call [phone]

## 2019-10-20 LAB — T3, FREE: T3, Free: 3.1 pg/mL (ref 2.0–4.4)

## 2019-10-22 DIAGNOSIS — R600 Localized edema: Secondary | ICD-10-CM | POA: Insufficient documentation

## 2019-10-22 DIAGNOSIS — K5909 Other constipation: Secondary | ICD-10-CM | POA: Insufficient documentation

## 2019-10-22 DIAGNOSIS — M810 Age-related osteoporosis without current pathological fracture: Secondary | ICD-10-CM | POA: Insufficient documentation

## 2019-10-22 DIAGNOSIS — D649 Anemia, unspecified: Secondary | ICD-10-CM | POA: Insufficient documentation

## 2019-10-23 DIAGNOSIS — Z96652 Presence of left artificial knee joint: Secondary | ICD-10-CM | POA: Insufficient documentation

## 2019-10-24 ENCOUNTER — Encounter: Payer: Self-pay | Admitting: Adult Health

## 2019-10-24 ENCOUNTER — Other Ambulatory Visit: Payer: Self-pay | Admitting: Adult Health

## 2019-10-24 ENCOUNTER — Non-Acute Institutional Stay (SKILLED_NURSING_FACILITY): Payer: PPO | Admitting: Adult Health

## 2019-10-24 DIAGNOSIS — G8929 Other chronic pain: Secondary | ICD-10-CM

## 2019-10-24 DIAGNOSIS — M545 Low back pain, unspecified: Secondary | ICD-10-CM

## 2019-10-24 DIAGNOSIS — M1712 Unilateral primary osteoarthritis, left knee: Secondary | ICD-10-CM

## 2019-10-24 DIAGNOSIS — K219 Gastro-esophageal reflux disease without esophagitis: Secondary | ICD-10-CM | POA: Diagnosis not present

## 2019-10-24 MED ORDER — OXYCODONE HCL 5 MG PO TABS: 5.0000 mg | ORAL_TABLET | Freq: Two times a day (BID) | ORAL | 0 refills | Status: DC

## 2019-10-24 NOTE — Progress Notes (Signed)
Location:    [facility] Number: 133/P Place of Service:  SNF (31)   CODE STATUS: Full Code  Allergies  Allergen Reactions  . Flexeril [Cyclobenzaprine] Anaphylaxis  . Other Anaphylaxis and Other (See Comments)    ALL TREE NUTS  . Keflex [Cephalexin] Itching  . Aspirin Other (See Comments)    Cannot take due to ulcers  . Statins Swelling and Other (See Comments)    Pain  . Adhesive [Tape] Rash  . Chlorhexidine Rash    Chief Complaint  Patient presents with  . Acute Visit    Pain Management    HPI:  She has had a left knee replacement. She is taking ultram 50 mg every 6 hours as needed. She is also taking oxycodone 5 mg every 6 hours as needed. She tells me that she is not using the ultram; will stop this medication. She tells me that she was taking 40 mg of oxycodone daily for her chronic back pain. We have discussed the chronic use of level 2 narcotics. She verbalized understanding. We will change her oxycodone to 5 mg twice daily on a routine basis. Will make further adjustments as indicated.    Past Medical History:  Diagnosis Date  . Anxiety   . Asthma   . Back pain, chronic   . CKD (chronic kidney disease) stage 3, GFR 30-59 ml/min   . Compression fx, thoracic spine (HCC)    T - 11  . COPD (chronic obstructive pulmonary disease) ([place]) 12/16/2015  . 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 Essential hypertension   . GERD (gastroesophageal reflux disease)   . HOH (hard of hearing)   . Inflammatory arthritis   . Iron deficiency anemia 01/2012  . Pulmonary eosinophilia   . Spinal stenosis   . Tubular adenoma 11/2012    Past Surgical History:  Procedure Laterality Date  . ABDOMINAL HYSTERECTOMY    . BACK SURGERY    . CARPAL TUNNEL RELEASE Right 4/14  . CATARACT EXTRACTION W/PHACO Left 11/19/2014   Procedure: CATARACT EXTRACTION PHACO AND INTRAOCULAR LENS PLACEMENT (I[facility]);  Surgeon: Williams Che, MD;  Location: [facility];  Service: Ophthalmology;   Laterality: Left;  CDE: 4.77  . CATARACT EXTRACTION W/PHACO Right 02/03/2015   Procedure: CATARACT EXTRACTION PHACO AND INTRAOCULAR LENS PLACEMENT ([place]);  Surgeon: Williams Che, MD;  Location: [facility];  Service: Ophthalmology;  Laterality: Right;  CDE: 4.54  . COLONOSCOPY  06/19/2008   RMR: tortuous and elongated colon with scattered left-sided diverticula/colonic mucosa appeared entirely normal. Prior colonic ulcers had resolved.  . COLONOSCOPY  12/2007   Dr. Gala Romney: Scattered diffuse sigmoid diverticula, 2 areas of ulceration at the hepatic flexure. Biopsies unremarkable.  . COLONOSCOPY N/A 11/20/2012   next TCS 11/2017  . COLONOSCOPY WITH PROPOFOL N/A 12/22/2017   Procedure: COLONOSCOPY WITH PROPOFOL;  Surgeon: Daneil Dolin, MD;  Location: [facility];  Service: Endoscopy;  Laterality: N/A;  7:30am  . DECOMPRESSIVE LUMBAR LAMINECTOMY LEVEL 2  04/20/2012   Procedure: DECOMPRESSIVE LUMBAR LAMINECTOMY LEVEL 2;  Surgeon: Tobi Bastos, MD;  Location: [facility];  Service: Orthopedics;  Laterality: Right;  Decompressive Lumbar Laminectomy of the L4 - L5 and L5 - S1 Complete/Laminectomy L5 on the Right (X-Ray)  . ESOPHAGOGASTRODUODENOSCOPY  12/2007   Dr. 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 Small hiatal hernia. Slightly pale duodenal mucosa (biopsy unremarkable)  . ESOPHAGOGASTRODUODENOSCOPY (EGD) WITH PROPOFOL N/A 01/20/2017   Dr. Gala Romney: Medium  sized hiatal hernia empiric esophageal dilation for history of dysphagia  . EYE SURGERY     BIL CATARACTS  . IR KYPHO THORACIC WITH BONE BIOPSY  02/08/2017  . IR RADIOLOGIST EVAL & MGMT  02/02/2017  . JOINT REPLACEMENT    . KNEE ARTHROSCOPY Right   . MALONEY DILATION N/A 01/20/2017   Procedure: Venia Minks DILATION;  Surgeon: Daneil Dolin, MD;  [facility];  Service: Endoscopy;  Laterality: N/A;  . POLYPECTOMY  12/22/2017   Procedure: POLYPECTOMY;  Surgeon: Daneil Dolin, MD;  Location:  [facility];  Service: Endoscopy;;  . REVERSE SHOULDER ARTHROPLASTY Right 05/24/2014   Procedure: RIGHT SHOULDER REVERSE ARTHROPLASTY;  Surgeon: Netta Cedars, MD;  Location: [facility];  Service: Orthopedics;  Laterality: Right;  . REVERSE SHOULDER ARTHROPLASTY Left 06/10/2017   Procedure: LEFT REVERSE SHOULDER ARTHROPLASTY;  Surgeon: Netta Cedars, MD;  Location: [facility];  Service: Orthopedics;  Laterality: Left;  . TOTAL KNEE ARTHROPLASTY  01/24/2012   Procedure: TOTAL KNEE ARTHROPLASTY;  Surgeon: Gearlean Alf, MD;  Location: [facility];  Service: Orthopedics;  Laterality: Right;  . TOTAL KNEE ARTHROPLASTY Left 10/15/2019   Procedure: TOTAL KNEE ARTHROPLASTY;  Surgeon: Gaynelle Arabian, MD;  Location: [facility];  Service: Orthopedics;  Laterality: Left;  44min  . TUBAL LIGATION      Social History   Socioeconomic History  . Marital status: Single    Spouse name: Not on file  . Number of children: 3  . Years of education: Not on file  . Highest education level: Not on file  Occupational History  . Not on file  Tobacco Use  . Smoking status: Former Smoker    Packs/day: 2.00    Years: 40.00    Pack years: 80.00    Types: Cigarettes    Quit date: 01/19/1992    Years since quitting: 27.7  . Smokeless tobacco: Never Used  Vaping Use  . Vaping Use: Never used  Substance and Sexual Activity  . Alcohol use: No  . Drug use: No  . Sexual activity: Never  Other Topics Concern  . Not on file  Social History Narrative  . Not on file   Social Determinants of Health   Financial Resource Strain:   . Difficulty of Paying Living Expenses:   Food Insecurity:   . Worried About Charity fundraiser in the Last Year:   . Arboriculturist in the Last Year:   Transportation Needs:   . Film/video editor (Medical):   Marland Kitchen Lack of Transportation (Non-Medical):   Physical Activity:   . Days of Exercise per Week:   . Minutes of Exercise per Session:   Stress:   . Feeling of Stress :   Social  Connections:   . Frequency of Communication with Friends and Family:   . Frequency of Social Gatherings with Friends and Family:   . Attends Religious Services:   . Active Member of Clubs or Organizations:   . Attends Archivist Meetings:   Marland Kitchen Marital Status:   Intimate Partner Violence:   . Fear of Current or Ex-Partner:   . Emotionally Abused:   Marland Kitchen Physically Abused:   . Sexually Abused:    Family History  Problem Relation Age of Onset  . Breast cancer Sister   . Colon cancer Sister        two deceased, one living and terminal, one current undergoing treatment, ages 75, 32, 52, 59  . Hypertension Mother   .  Stroke Mother   . Heart attack Mother   . Hypertension Father   . Prostate cancer Father       VITAL SIGNS BP 102/69   Pulse 78   Temp 98.4 F (36.9 C) (Oral)   Resp 20   Ht 5' (1.524 m)   Wt 186 lb 12.8 oz (84.7 kg)   BMI 36.48 kg/m   Outpatient Encounter Medications as of 10/24/2019  Medication Sig  . albuterol (PROAIR HFA) 108 (90 BASE) MCG/ACT inhaler Inhale 2 puffs into the lungs every 6 (six) hours as needed for wheezing or shortness of breath (For COPD).   Marland Kitchen ALPRAZolam (XANAX) 0.5 MG tablet Take 0.5 tablets (0.25 mg total) by mouth 2 (two) times daily as needed for up to 12 days for anxiety.  Derrill Memo ON 11/06/2019] aspirin 81 MG chewable tablet Chew 81 mg by mouth daily. Special Instructions: Start after completion of xarelto and take x 3 weeks and then d/c.  Marland Kitchen Benralizumab (FASENRA) 30 MG/ML SOSY Inject 30 mg into the skin. Once A Day Every 56 Days  . denosumab (PROLIA) 60 MG/ML SOSY injection Inject 60 mg into the skin every 6 (six) months.  . docusate sodium (COLACE) 100 MG capsule Take 100 mg by mouth every other day. In the afternoon  . DULERA 200-5 MCG/ACT AERO Inhale 2 puffs into the lungs 2 (two) times daily.  Marland Kitchen esomeprazole (NEXIUM) 40 MG capsule TAKE 1 CAPSULE BY MOUTH TWICE DAILY.  . ferrous sulfate 325 (65 FE) MG tablet Take 325 mg by  mouth daily with breakfast.  . fexofenadine (ALLEGRA) 180 MG tablet Take 180 mg by mouth daily.  . fluticasone (FLONASE) 50 MCG/ACT nasal spray Place 1 spray into both nostrils daily as needed for allergies or rhinitis.   Marland Kitchen ketotifen (ZADITOR) 0.025 % ophthalmic solution Place 1 drop into both eyes 2 (two) times daily as needed (allergies).   Marland Kitchen LINZESS 290 MCG CAPS capsule TAKE 1 CAPSULE BY MOUTH DAILY BEFORE BREAKFAST  . Menthol, Topical Analgesic, (BIOFREEZE) 4 % GEL Apply 1 application topically 2 (two) times daily. Special Instructions: for back pain  . methocarbamol (ROBAXIN) 500 MG tablet Take 1 tablet (500 mg total) by mouth every 6 (six) hours as needed for muscle spasms.  . metoprolol tartrate (LOPRESSOR) 25 MG tablet Take 25 mg by mouth 2 (two) times daily.  . montelukast (SINGULAIR) 10 MG tablet Take 10 mg by mouth at bedtime.   . NON FORMULARY Diet: _____ Regular, ___x___ NAS, _______Consistent Carbohydrate, _______NPO _____Other  . olmesartan (BENICAR) 40 MG tablet Take 40 mg by mouth daily.  . ondansetron (ZOFRAN) 4 MG tablet Take 4 mg by mouth 3 (three) times daily before meals. For Nausea  . Polyethyl Glycol-Propyl Glycol (SYSTANE OP) Place 1 drop into both eyes 3 (three) times daily as needed (dry/irritated eyes.).   Marland Kitchen polyethylene glycol (MIRALAX / GLYCOLAX) packet Take 17 g by mouth daily as needed (constipation.).   Marland Kitchen potassium chloride (K-DUR) 10 MEQ tablet Take 10 mEq by mouth every other day. IN THE MORNING  . rivaroxaban (XARELTO) 10 MG TABS tablet Take 1 tablet (10 mg total) by mouth daily with breakfast for 19 days. Then take one 81 mg aspirin once a day for three weeks. Then discontinue aspirin.  . torsemide (DEMADEX) 10 MG tablet Take 10 mg by mouth every other day. IN THE MORNING  . [DISCONTINUED] oxyCODONE (OXY IR/ROXICODONE) 5 MG immediate release tablet Take 5 mg by mouth every 6 (six) hours  as needed for severe pain. Special Instructions: As needed for pain rated  9-10/10 thru 10/24/19 & then provider to reassess need.  . [DISCONTINUED] traMADol (ULTRAM) 50 MG tablet Take 50 mg by mouth every 6 (six) hours as needed. Special Instructions: As needed for pain rated 7-9 out of 10 thru 10/24/19.  . [DISCONTINUED] EPINEPHrine (EPIPEN 2-PAK) 0.3 mg/0.3 mL IJ SOAJ injection Inject 0.3 mg into the muscle once.  (Patient not taking: Reported on 10/19/2019)  . [DISCONTINUED] mupirocin ointment (BACTROBAN) 2 % Apply 1 application topically 2 (two) times daily as needed (skin irritation/wound care).  (Patient not taking: Reported on 10/19/2019)  . [DISCONTINUED] Nebulizers (DEVILBISS TRAVELER NEBULIZER) MISC With tubing and supplies to be used with nebulized medications. Pt's nebulizer is over 76 years old and it is broken.  J45.50 Severe persistent asthma, J44.9 COPD (Patient not taking: Reported on 10/19/2019)   No facility-administered encounter medications on file as of 10/24/2019.     SIGNIFICANT DIAGNOSTIC EXAMS   LABS REVIEWED PREVIOUS  10-16-19: wbc 9.9; hgb 9.4; hct 30.9; mcv 82.2 plt 273; glucose 124; bun 13; creat 1.08; k+ 4.9; na++ 135; ca 7.6 10-17-19: wbc 11.9; hgb 9.5; hct 31.4; mcv 81.1 plt 267; glucose 132; bun 17; creat 1.27; k+ 4.4; na++ 133; ca 8.3  10-19-19: free T4: 1.24 free T3: 3.1 tsh 0.947  NO NEW LABS.    Review of Systems  Constitutional: Negative for malaise/fatigue.  Respiratory: Negative for cough and shortness of breath.   Cardiovascular: Negative for chest pain, palpitations and leg swelling.  Gastrointestinal: Positive for nausea. Negative for abdominal pain, constipation, heartburn and vomiting.       Gets hungry gets nausea when she sees food  Musculoskeletal: Positive for back pain. Negative for joint pain and myalgias.  Skin: Negative.   Neurological: Negative for dizziness.  Endo/Heme/Allergies:       Is cold   Psychiatric/Behavioral: The patient is not nervous/anxious.     Physical Exam Constitutional:      General: She is not  in acute distress.    Appearance: She is well-developed. She is obese. She is not diaphoretic.  Neck:     Thyroid: No thyromegaly.  Cardiovascular:     Rate and Rhythm: Normal rate and regular rhythm.     Pulses: Normal pulses.     Heart sounds: Normal heart sounds.  Pulmonary:     Effort: Pulmonary effort is normal. No respiratory distress.     Breath sounds: Normal breath sounds.  Abdominal:     General: Bowel sounds are normal. There is no distension.     Palpations: Abdomen is soft.     Tenderness: There is no abdominal tenderness.  Musculoskeletal:     Cervical back: Neck supple.     Right lower leg: No edema.     Left lower leg: No edema.     Comments:  Is able to move all extremities Is status post left knee replacement    Lymphadenopathy:     Cervical: No cervical adenopathy.  Skin:    General: Skin is warm and dry.     Comments: Incision line without signs of infection   Neurological:     Mental Status: She is alert and oriented to person, place, and time.  Psychiatric:        Mood and Affect: Mood normal.       ASSESSMENT/ PLAN:  TODAY  1. Primary osteoarthritis of left knee 2. Chronic bilateral lower back pain without sciatica  Will  stop ultram Will begin zofran 4 mg prior to meals Will begin oxycodone 5 mg twice daily     MD is aware of resident's narcotic use and is in agreement with current plan of care. We will attempt to wean resident as appropriate.  Ok Edwards NP [facility]  Contact [phone] Monday through Friday 8am- 5pm  After hours call [phone]

## 2019-10-25 ENCOUNTER — Non-Acute Institutional Stay (SKILLED_NURSING_FACILITY): Payer: PPO | Admitting: Adult Health

## 2019-10-25 ENCOUNTER — Other Ambulatory Visit: Payer: Self-pay | Admitting: Adult Health

## 2019-10-25 ENCOUNTER — Encounter: Payer: Self-pay | Admitting: Adult Health

## 2019-10-25 DIAGNOSIS — J4550 Severe persistent asthma, uncomplicated: Secondary | ICD-10-CM | POA: Diagnosis not present

## 2019-10-25 DIAGNOSIS — M48061 Spinal stenosis, lumbar region without neurogenic claudication: Secondary | ICD-10-CM | POA: Diagnosis not present

## 2019-10-25 DIAGNOSIS — M1712 Unilateral primary osteoarthritis, left knee: Secondary | ICD-10-CM | POA: Diagnosis not present

## 2019-10-25 DIAGNOSIS — R600 Localized edema: Secondary | ICD-10-CM

## 2019-10-25 DIAGNOSIS — G8929 Other chronic pain: Secondary | ICD-10-CM | POA: Insufficient documentation

## 2019-10-25 MED ORDER — LINACLOTIDE 290 MCG PO CAPS: ORAL_CAPSULE | ORAL | 0 refills | Status: DC

## 2019-10-25 MED ORDER — KETOTIFEN FUMARATE 0.025 % OP SOLN: 1.0000 [drp] | Freq: Two times a day (BID) | OPHTHALMIC | 0 refills | Status: AC | PRN

## 2019-10-25 MED ORDER — OLMESARTAN MEDOXOMIL 40 MG PO TABS: 40.0000 mg | ORAL_TABLET | Freq: Every day | ORAL | 0 refills | Status: DC

## 2019-10-25 MED ORDER — METHOCARBAMOL 500 MG PO TABS: 500.0000 mg | ORAL_TABLET | Freq: Four times a day (QID) | ORAL | 0 refills | Status: DC | PRN

## 2019-10-25 MED ORDER — ESOMEPRAZOLE MAGNESIUM 40 MG PO CPDR: 40.0000 mg | DELAYED_RELEASE_CAPSULE | Freq: Two times a day (BID) | ORAL | 0 refills | Status: DC

## 2019-10-25 MED ORDER — MONTELUKAST SODIUM 10 MG PO TABS: 10.0000 mg | ORAL_TABLET | Freq: Every day | ORAL | 0 refills | Status: DC

## 2019-10-25 MED ORDER — METOPROLOL TARTRATE 25 MG PO TABS: 25.0000 mg | ORAL_TABLET | Freq: Two times a day (BID) | ORAL | 0 refills | Status: DC

## 2019-10-25 MED ORDER — TORSEMIDE 10 MG PO TABS: 10.0000 mg | ORAL_TABLET | ORAL | 0 refills | Status: DC

## 2019-10-25 MED ORDER — ALPRAZOLAM 0.5 MG PO TABS: 0.2500 mg | ORAL_TABLET | Freq: Two times a day (BID) | ORAL | 0 refills | Status: AC | PRN

## 2019-10-25 MED ORDER — FERROUS SULFATE 325 (65 FE) MG PO TABS: 325.0000 mg | ORAL_TABLET | Freq: Every day | ORAL | 0 refills | Status: AC

## 2019-10-25 MED ORDER — ONDANSETRON HCL 4 MG PO TABS: 4.0000 mg | ORAL_TABLET | Freq: Three times a day (TID) | ORAL | 0 refills | Status: DC

## 2019-10-25 MED ORDER — RIVAROXABAN 10 MG PO TABS: 10.0000 mg | ORAL_TABLET | Freq: Every day | ORAL | 0 refills | Status: DC

## 2019-10-25 MED ORDER — POTASSIUM CHLORIDE ER 10 MEQ PO TBCR: 10.0000 meq | EXTENDED_RELEASE_TABLET | ORAL | 0 refills | Status: DC

## 2019-10-25 MED ORDER — DULERA 200-5 MCG/ACT IN AERO: 2.0000 | INHALATION_SPRAY | Freq: Two times a day (BID) | RESPIRATORY_TRACT | 0 refills | Status: AC

## 2019-10-25 MED ORDER — OXYCODONE HCL 5 MG PO TABS: 5.0000 mg | ORAL_TABLET | Freq: Three times a day (TID) | ORAL | 0 refills | Status: DC

## 2019-10-25 NOTE — Progress Notes (Signed)
Location:    [place] Room Number: 133/P Place of Service:  SNF (31)   CODE STATUS: Full Code  Allergies  Allergen Reactions  . Flexeril [Cyclobenzaprine] Anaphylaxis  . Other Anaphylaxis and Other (See Comments)    ALL TREE NUTS  . Keflex [Cephalexin] Itching  . Aspirin Other (See Comments)    Cannot take due to ulcers  . Statins Swelling and Other (See Comments)    Pain  . Adhesive [Tape] Rash  . Chlorhexidine Rash    Chief Complaint  Patient presents with  . Discharge Note    Discharge Visit    HPI:  She is being discharged to home. She will complete her pt on an outpatient basis. She will not need any dme; will need to follow up with her medical provider and will need her prescriptions written. She was hospitalized for a left knee replacement. She was admitted to this facility for short term rehab. She has had back pain since her admission for which she has had for a prolonged period of time. She has had nausea for the past couple of days which is being treated with zofran. She will benefit from outpatient therapy.   Past Medical History:  Diagnosis Date  . Anxiety   . Asthma   . Back pain, chronic   . CKD (chronic kidney disease) stage 3, GFR 30-59 ml/min   . Compression fx, thoracic spine (HCC)    T - 11  . COPD (chronic obstructive pulmonary disease) ([place]) 12/16/2015  . 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 Essential hypertension   . GERD (gastroesophageal reflux disease)   . HOH (hard of hearing)   . Inflammatory arthritis   . Iron deficiency anemia 01/2012  . Pulmonary eosinophilia   . Spinal stenosis   . Tubular adenoma 11/2012    Past Surgical History:  Procedure Laterality Date  . ABDOMINAL HYSTERECTOMY    . BACK SURGERY    . CARPAL TUNNEL RELEASE Right 4/14  . CATARACT EXTRACTION W/PHACO Left 11/19/2014   Procedure: CATARACT EXTRACTION PHACO AND INTRAOCULAR LENS PLACEMENT (IOC);  Surgeon: Williams Che, MD;  Location: [facility];   Service: Ophthalmology;  Laterality: Left;  CDE: 4.77  . CATARACT EXTRACTION W/PHACO Right 02/03/2015   Procedure: CATARACT EXTRACTION PHACO AND INTRAOCULAR LENS PLACEMENT ([place]);  Surgeon: Williams Che, MD;  Location: [facility];  Service: Ophthalmology;  Laterality: Right;  CDE: 4.54  . COLONOSCOPY  06/19/2008   RMR: tortuous and elongated colon with scattered left-sided diverticula/colonic mucosa appeared entirely normal. Prior colonic ulcers had resolved.  . COLONOSCOPY  12/2007   Dr. Gala Romney: Scattered diffuse sigmoid diverticula, 2 areas of ulceration at the hepatic flexure. Biopsies unremarkable.  . COLONOSCOPY N/A 11/20/2012   next TCS 11/2017  . COLONOSCOPY WITH PROPOFOL N/A 12/22/2017   Procedure: COLONOSCOPY WITH PROPOFOL;  Surgeon: Daneil Dolin, MD;  Location[facility];  Service: Endoscopy;  Laterality: N/A;  7:30am  . DECOMPRESSIVE LUMBAR LAMINECTOMY LEVEL 2  04/20/2012   Procedure: DECOMPRESSIVE LUMBAR LAMINECTOMY LEVEL 2;  Surgeon: Tobi Bastos, MD;  Location: [facility];  Service: Orthopedics;  Laterality: Right;  Decompressive Lumbar Laminectomy of the L4 - L5 and L5 - S1 Complete/Laminectomy L5 on the Right (X-Ray)  . ESOPHAGOGASTRODUODENOSCOPY  12/2007   Dr. 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 Small hiatal hernia. Slightly pale duodenal mucosa (biopsy unremarkable)  . ESOPHAGOGASTRODUODENOSCOPY (EGD) WITH PROPOFOL N/A 01/20/2017   Dr. Gala Romney: Medium  sized hiatal hernia empiric esophageal dilation for history of dysphagia  . EYE SURGERY     BIL CATARACTS  . IR KYPHO THORACIC WITH BONE BIOPSY  02/08/2017  . IR RADIOLOGIST EVAL & MGMT  02/02/2017  . JOINT REPLACEMENT    . KNEE ARTHROSCOPY Right   . MALONEY DILATION N/A 01/20/2017   Procedure: Venia Minks DILATION;  Surgeon: Daneil Dolin, MD;  [facility];  Service: Endoscopy;  Laterality: N/A;  . POLYPECTOMY  12/22/2017   Procedure: POLYPECTOMY;  Surgeon: Daneil Dolin, MD;  [facility];  Service: Endoscopy;;  . REVERSE SHOULDER ARTHROPLASTY Right 05/24/2014   Procedure: RIGHT SHOULDER REVERSE ARTHROPLASTY;  Surgeon: Netta Cedars, MD;  Location: [facility];  Service: Orthopedics;  Laterality: Right;  . REVERSE SHOULDER ARTHROPLASTY Left 06/10/2017   Procedure: LEFT REVERSE SHOULDER ARTHROPLASTY;  Surgeon: Netta Cedars, MD;  Location: [facility];  Service: Orthopedics;  Laterality: Left;  . TOTAL KNEE ARTHROPLASTY  01/24/2012   Procedure: TOTAL KNEE ARTHROPLASTY;  Surgeon: Gearlean Alf, MD;  Location: [facility];  Service: Orthopedics;  Laterality: Right;  . TOTAL KNEE ARTHROPLASTY Left 10/15/2019   Procedure: TOTAL KNEE ARTHROPLASTY;  Surgeon: Gaynelle Arabian, MD;  Location: [facility];  Service: Orthopedics;  Laterality: Left;  25min  . TUBAL LIGATION      Social History   Socioeconomic History  . Marital status: Single    Spouse name: Not on file  . Number of children: 3  . Years of education: Not on file  . Highest education level: Not on file  Occupational History  . Not on file  Tobacco Use  . Smoking status: Former Smoker    Packs/day: 2.00    Years: 40.00    Pack years: 80.00    Types: Cigarettes    Quit date: 01/19/1992    Years since quitting: 27.7  . Smokeless tobacco: Never Used  Vaping Use  . Vaping Use: Never used  Substance and Sexual Activity  . Alcohol use: No  . Drug use: No  . Sexual activity: Never  Other Topics Concern  . Not on file  Social History Narrative  . Not on file   Social Determinants of Health   Financial Resource Strain:   . Difficulty of Paying Living Expenses:   Food Insecurity:   . Worried About Charity fundraiser in the Last Year:   . Arboriculturist in the Last Year:   Transportation Needs:   . Film/video editor (Medical):   Marland Kitchen Lack of Transportation (Non-Medical):   Physical Activity:   . Days of Exercise per Week:   . Minutes of Exercise per Session:   Stress:   . Feeling of  Stress :   Social Connections:   . Frequency of Communication with Friends and Family:   . Frequency of Social Gatherings with Friends and Family:   . Attends Religious Services:   . Active Member of Clubs or Organizations:   . Attends Archivist Meetings:   Marland Kitchen Marital Status:   Intimate Partner Violence:   . Fear of Current or Ex-Partner:   . Emotionally Abused:   Marland Kitchen Physically Abused:   . Sexually Abused:    Family History  Problem Relation Age of Onset  . Breast cancer Sister   . Colon cancer Sister        two deceased, one living and terminal, one current undergoing treatment, ages 63, 95, 28, 36  . Hypertension Mother   .  Stroke Mother   . Heart attack Mother   . Hypertension Father   . Prostate cancer Father       VITAL SIGNS BP (!) 126/52   Pulse 60   Temp 97.8 F (36.6 C) (Oral)   Resp (!) 21   Ht 5' (1.524 m)   Wt 186 lb 9.6 oz (84.6 kg)   BMI 36.44 kg/m   Outpatient Encounter Medications as of 10/25/2019  Medication Sig  . albuterol (PROAIR HFA) 108 (90 BASE) MCG/ACT inhaler Inhale 2 puffs into the lungs every 6 (six) hours as needed for wheezing or shortness of breath (For COPD).   Derrill Memo ON 11/06/2019] aspirin 81 MG chewable tablet Chew 81 mg by mouth daily. Special Instructions: Start after completion of xarelto and take x 3 weeks and then d/c.  Marland Kitchen Benralizumab (FASENRA) 30 MG/ML SOSY Inject 30 mg into the skin. Once A Day Every 56 Days  . denosumab (PROLIA) 60 MG/ML SOSY injection Inject 60 mg into the skin every 6 (six) months.  . docusate sodium (COLACE) 100 MG capsule Take 100 mg by mouth every other day. In the afternoon  . fexofenadine (ALLEGRA) 180 MG tablet Take 180 mg by mouth daily.  . fluticasone (FLONASE) 50 MCG/ACT nasal spray Place 1 spray into both nostrils daily as needed for allergies or rhinitis.   . Menthol, Topical Analgesic, (BIOFREEZE) 4 % GEL Apply 1 application topically 2 (two) times daily. Special Instructions: for back  pain  . NON FORMULARY Diet: _____ Regular, ___x___ NAS, _______Consistent Carbohydrate, _______NPO _____Other  . Polyethyl Glycol-Propyl Glycol (SYSTANE OP) Place 1 drop into both eyes 3 (three) times daily as needed (dry/irritated eyes.).   Marland Kitchen polyethylene glycol (MIRALAX / GLYCOLAX) packet Take 17 g by mouth daily as needed (constipation.).    No facility-administered encounter medications on file as of 10/25/2019.     SIGNIFICANT DIAGNOSTIC EXAMS   LABS REVIEWED PREVIOUS  10-16-19: wbc 9.9; hgb 9.4; hct 30.9; mcv 82.2 plt 273; glucose 124; bun 13; creat 1.08; k+ 4.9; na++ 135; ca 7.6 10-17-19: wbc 11.9; hgb 9.5; hct 31.4; mcv 81.1 plt 267; glucose 132; bun 17; creat 1.27; k+ 4.4; na++ 133; ca 8.3  10-19-19: free T4: 1.24 free T3: 3.1 tsh 0.947  NO NEW LABS.   Review of Systems  Constitutional: Negative for malaise/fatigue.  Respiratory: Negative for cough and shortness of breath.   Cardiovascular: Negative for chest pain, palpitations and leg swelling.  Gastrointestinal: Negative for abdominal pain, constipation and heartburn.  Musculoskeletal: Negative for back pain, joint pain and myalgias.  Skin: Negative.   Neurological: Negative for dizziness.  Psychiatric/Behavioral: The patient is not nervous/anxious.     Physical Exam Constitutional:      General: She is not in acute distress.    Appearance: She is well-developed. She is obese. She is not diaphoretic.  Neck:     Thyroid: No thyromegaly.  Cardiovascular:     Rate and Rhythm: Normal rate and regular rhythm.     Pulses: Normal pulses.     Heart sounds: Normal heart sounds.  Pulmonary:     Effort: Pulmonary effort is normal. No respiratory distress.     Breath sounds: Normal breath sounds.  Abdominal:     General: Bowel sounds are normal. There is no distension.     Palpations: Abdomen is soft.     Tenderness: There is no abdominal tenderness.  Musculoskeletal:     Cervical back: Neck supple.     Right lower  leg:  No edema.     Left lower leg: No edema.     Comments:  Is able to move all extremities Is status post left knee replacement     Lymphadenopathy:     Cervical: No cervical adenopathy.  Skin:    General: Skin is warm and dry.     Comments: Incision line without signs of infection    Neurological:     Mental Status: She is alert and oriented to person, place, and time.  Psychiatric:        Mood and Affect: Mood normal.      ASSESSMENT/ PLAN:   Patient is being discharged with the following home health services:  Outpatient pt to evaluate and treat as indicated for gait balance strength   Patient is being discharged with the following durable medical equipment:  None needed   Patient has been advised to f/u with their PCP in 1-2 weeks to bring them up to date on their rehab stay.  Social services at facility was responsible for arranging this appointment.  Pt was provided with a 30 day supply of prescriptions for medications and refills must be obtained from their PCP.  For controlled substances, a more limited supply may be provided adequate until PCP appointment only.  A 30 day supply of her prescription medications with #10 xanax 0.5 mg tabs; #45 oxycodone 5 mg tabs to [employer]  Time spent with patient 35 minutes: therapy medications dme.      Ok Edwards NP [facility]  Contact [phone] Monday through Friday 8am- 5pm  After hours call [phone]

## 2019-10-29 ENCOUNTER — Ambulatory Visit: Payer: PPO | Admitting: Gastroenterology

## 2019-10-29 DIAGNOSIS — J441 Chronic obstructive pulmonary disease with (acute) exacerbation: Secondary | ICD-10-CM | POA: Diagnosis not present

## 2019-10-29 DIAGNOSIS — M25662 Stiffness of left knee, not elsewhere classified: Secondary | ICD-10-CM | POA: Diagnosis not present

## 2019-10-29 DIAGNOSIS — M25562 Pain in left knee: Secondary | ICD-10-CM | POA: Diagnosis not present

## 2019-10-29 DIAGNOSIS — I129 Hypertensive chronic kidney disease with stage 1 through stage 4 chronic kidney disease, or unspecified chronic kidney disease: Secondary | ICD-10-CM | POA: Diagnosis not present

## 2019-10-29 DIAGNOSIS — M48061 Spinal stenosis, lumbar region without neurogenic claudication: Secondary | ICD-10-CM | POA: Diagnosis not present

## 2019-10-29 DIAGNOSIS — Z471 Aftercare following joint replacement surgery: Secondary | ICD-10-CM | POA: Diagnosis not present

## 2019-10-29 DIAGNOSIS — E1122 Type 2 diabetes mellitus with diabetic chronic kidney disease: Secondary | ICD-10-CM | POA: Diagnosis not present

## 2019-10-29 DIAGNOSIS — Z96652 Presence of left artificial knee joint: Secondary | ICD-10-CM | POA: Diagnosis not present

## 2019-10-29 DIAGNOSIS — M6281 Muscle weakness (generalized): Secondary | ICD-10-CM | POA: Diagnosis not present

## 2019-10-29 DIAGNOSIS — R2689 Other abnormalities of gait and mobility: Secondary | ICD-10-CM | POA: Diagnosis not present

## 2019-10-29 DIAGNOSIS — K219 Gastro-esophageal reflux disease without esophagitis: Secondary | ICD-10-CM | POA: Diagnosis not present

## 2019-10-29 DIAGNOSIS — R451 Restlessness and agitation: Secondary | ICD-10-CM | POA: Diagnosis not present

## 2019-10-29 DIAGNOSIS — N183 Chronic kidney disease, stage 3 unspecified: Secondary | ICD-10-CM | POA: Diagnosis not present

## 2019-10-29 DIAGNOSIS — G720 Drug-induced myopathy: Secondary | ICD-10-CM | POA: Diagnosis not present

## 2019-10-31 DIAGNOSIS — M1712 Unilateral primary osteoarthritis, left knee: Secondary | ICD-10-CM | POA: Diagnosis not present

## 2019-10-31 DIAGNOSIS — Z0189 Encounter for other specified special examinations: Secondary | ICD-10-CM | POA: Diagnosis not present

## 2019-10-31 DIAGNOSIS — I10 Essential (primary) hypertension: Secondary | ICD-10-CM | POA: Diagnosis not present

## 2019-10-31 DIAGNOSIS — R269 Unspecified abnormalities of gait and mobility: Secondary | ICD-10-CM | POA: Diagnosis not present

## 2019-11-01 DIAGNOSIS — M6281 Muscle weakness (generalized): Secondary | ICD-10-CM | POA: Diagnosis not present

## 2019-11-01 DIAGNOSIS — M25662 Stiffness of left knee, not elsewhere classified: Secondary | ICD-10-CM | POA: Diagnosis not present

## 2019-11-01 DIAGNOSIS — M25562 Pain in left knee: Secondary | ICD-10-CM | POA: Diagnosis not present

## 2019-11-01 DIAGNOSIS — R2689 Other abnormalities of gait and mobility: Secondary | ICD-10-CM | POA: Diagnosis not present

## 2019-11-01 DIAGNOSIS — Z96652 Presence of left artificial knee joint: Secondary | ICD-10-CM | POA: Diagnosis not present

## 2019-11-01 DIAGNOSIS — Z471 Aftercare following joint replacement surgery: Secondary | ICD-10-CM | POA: Diagnosis not present

## 2019-11-02 DIAGNOSIS — M25562 Pain in left knee: Secondary | ICD-10-CM | POA: Diagnosis not present

## 2019-11-02 DIAGNOSIS — Z96652 Presence of left artificial knee joint: Secondary | ICD-10-CM | POA: Diagnosis not present

## 2019-11-02 DIAGNOSIS — Z471 Aftercare following joint replacement surgery: Secondary | ICD-10-CM | POA: Diagnosis not present

## 2019-11-02 DIAGNOSIS — M6281 Muscle weakness (generalized): Secondary | ICD-10-CM | POA: Diagnosis not present

## 2019-11-02 DIAGNOSIS — R2689 Other abnormalities of gait and mobility: Secondary | ICD-10-CM | POA: Diagnosis not present

## 2019-11-02 DIAGNOSIS — M25662 Stiffness of left knee, not elsewhere classified: Secondary | ICD-10-CM | POA: Diagnosis not present

## 2019-11-06 DIAGNOSIS — Z96652 Presence of left artificial knee joint: Secondary | ICD-10-CM | POA: Diagnosis not present

## 2019-11-06 DIAGNOSIS — M6281 Muscle weakness (generalized): Secondary | ICD-10-CM | POA: Diagnosis not present

## 2019-11-06 DIAGNOSIS — M25662 Stiffness of left knee, not elsewhere classified: Secondary | ICD-10-CM | POA: Diagnosis not present

## 2019-11-06 DIAGNOSIS — Z471 Aftercare following joint replacement surgery: Secondary | ICD-10-CM | POA: Diagnosis not present

## 2019-11-06 DIAGNOSIS — R2689 Other abnormalities of gait and mobility: Secondary | ICD-10-CM | POA: Diagnosis not present

## 2019-11-06 DIAGNOSIS — M25562 Pain in left knee: Secondary | ICD-10-CM | POA: Diagnosis not present

## 2019-11-07 DIAGNOSIS — Z20822 Contact with and (suspected) exposure to covid-19: Secondary | ICD-10-CM | POA: Diagnosis not present

## 2019-11-07 DIAGNOSIS — K449 Diaphragmatic hernia without obstruction or gangrene: Secondary | ICD-10-CM | POA: Diagnosis not present

## 2019-11-07 DIAGNOSIS — Z886 Allergy status to analgesic agent status: Secondary | ICD-10-CM | POA: Diagnosis not present

## 2019-11-07 DIAGNOSIS — R110 Nausea: Secondary | ICD-10-CM | POA: Diagnosis not present

## 2019-11-07 DIAGNOSIS — D649 Anemia, unspecified: Secondary | ICD-10-CM | POA: Diagnosis not present

## 2019-11-07 DIAGNOSIS — E871 Hypo-osmolality and hyponatremia: Secondary | ICD-10-CM | POA: Diagnosis not present

## 2019-11-07 DIAGNOSIS — S22080A Wedge compression fracture of T11-T12 vertebra, initial encounter for closed fracture: Secondary | ICD-10-CM | POA: Diagnosis not present

## 2019-11-07 DIAGNOSIS — K5732 Diverticulitis of large intestine without perforation or abscess without bleeding: Secondary | ICD-10-CM | POA: Diagnosis not present

## 2019-11-07 DIAGNOSIS — I700 Atherosclerosis of aorta: Secondary | ICD-10-CM | POA: Diagnosis not present

## 2019-11-08 DIAGNOSIS — S22080A Wedge compression fracture of T11-T12 vertebra, initial encounter for closed fracture: Secondary | ICD-10-CM | POA: Diagnosis not present

## 2019-11-08 DIAGNOSIS — I700 Atherosclerosis of aorta: Secondary | ICD-10-CM | POA: Diagnosis not present

## 2019-11-08 DIAGNOSIS — K5732 Diverticulitis of large intestine without perforation or abscess without bleeding: Secondary | ICD-10-CM | POA: Diagnosis not present

## 2019-11-08 DIAGNOSIS — K449 Diaphragmatic hernia without obstruction or gangrene: Secondary | ICD-10-CM | POA: Diagnosis not present

## 2019-11-09 DIAGNOSIS — Z96652 Presence of left artificial knee joint: Secondary | ICD-10-CM | POA: Diagnosis not present

## 2019-11-09 DIAGNOSIS — M25662 Stiffness of left knee, not elsewhere classified: Secondary | ICD-10-CM | POA: Diagnosis not present

## 2019-11-09 DIAGNOSIS — Z471 Aftercare following joint replacement surgery: Secondary | ICD-10-CM | POA: Diagnosis not present

## 2019-11-09 DIAGNOSIS — R2689 Other abnormalities of gait and mobility: Secondary | ICD-10-CM | POA: Diagnosis not present

## 2019-11-09 DIAGNOSIS — M25562 Pain in left knee: Secondary | ICD-10-CM | POA: Diagnosis not present

## 2019-11-09 DIAGNOSIS — M6281 Muscle weakness (generalized): Secondary | ICD-10-CM | POA: Diagnosis not present

## 2019-11-11 NOTE — Anesthesia Procedure Notes (Addendum)
Anesthesia Regional Block: Adductor canal block   Pre-Anesthetic Checklist: ,, timeout performed, Correct Patient, Correct Site, Correct Laterality, Correct Procedure, Correct Position, site marked, Risks and benefits discussed,  Surgical consent,  Pre-op evaluation,  At surgeon's request and post-op pain management  Laterality: Left  Prep: chloraprep       Needles:   Needle Type: Stimulator Needle - 80          Additional Needles:   Procedures: Doppler guided,,,, ultrasound used (permanent image in chart),,,,  Narrative:  Start time: 10/15/2019 6:55 AM End time: 10/15/2019 7:10 AM Injection made incrementally with aspirations every 5 mL.  Performed by: Personally  Anesthesiologist: Belinda Block, MD

## 2019-11-11 NOTE — Addendum Note (Signed)
Addendum  created 11/11/19 2157 by Belinda Block, MD   Child order released for a procedure order, Clinical Note Signed, Intraprocedure Blocks edited, Order Canceled from Note

## 2019-11-12 DIAGNOSIS — Z471 Aftercare following joint replacement surgery: Secondary | ICD-10-CM | POA: Diagnosis not present

## 2019-11-12 DIAGNOSIS — M25662 Stiffness of left knee, not elsewhere classified: Secondary | ICD-10-CM | POA: Diagnosis not present

## 2019-11-12 DIAGNOSIS — R2689 Other abnormalities of gait and mobility: Secondary | ICD-10-CM | POA: Diagnosis not present

## 2019-11-12 DIAGNOSIS — Z96652 Presence of left artificial knee joint: Secondary | ICD-10-CM | POA: Diagnosis not present

## 2019-11-12 DIAGNOSIS — M25562 Pain in left knee: Secondary | ICD-10-CM | POA: Diagnosis not present

## 2019-11-12 DIAGNOSIS — M6281 Muscle weakness (generalized): Secondary | ICD-10-CM | POA: Diagnosis not present

## 2019-11-13 DIAGNOSIS — R296 Repeated falls: Secondary | ICD-10-CM | POA: Diagnosis not present

## 2019-11-13 DIAGNOSIS — J45998 Other asthma: Secondary | ICD-10-CM | POA: Diagnosis not present

## 2019-11-13 DIAGNOSIS — R600 Localized edema: Secondary | ICD-10-CM | POA: Diagnosis not present

## 2019-11-13 DIAGNOSIS — G8929 Other chronic pain: Secondary | ICD-10-CM | POA: Diagnosis not present

## 2019-11-13 DIAGNOSIS — K5732 Diverticulitis of large intestine without perforation or abscess without bleeding: Secondary | ICD-10-CM | POA: Diagnosis not present

## 2019-11-13 DIAGNOSIS — Z96652 Presence of left artificial knee joint: Secondary | ICD-10-CM | POA: Diagnosis not present

## 2019-11-13 DIAGNOSIS — I10 Essential (primary) hypertension: Secondary | ICD-10-CM | POA: Diagnosis not present

## 2019-11-13 DIAGNOSIS — M1712 Unilateral primary osteoarthritis, left knee: Secondary | ICD-10-CM | POA: Diagnosis not present

## 2019-11-13 DIAGNOSIS — M4800 Spinal stenosis, site unspecified: Secondary | ICD-10-CM | POA: Diagnosis not present

## 2019-11-13 DIAGNOSIS — R7989 Other specified abnormal findings of blood chemistry: Secondary | ICD-10-CM | POA: Diagnosis not present

## 2019-11-13 DIAGNOSIS — K219 Gastro-esophageal reflux disease without esophagitis: Secondary | ICD-10-CM | POA: Diagnosis not present

## 2019-11-13 DIAGNOSIS — R944 Abnormal results of kidney function studies: Secondary | ICD-10-CM | POA: Diagnosis not present

## 2019-11-13 DIAGNOSIS — R062 Wheezing: Secondary | ICD-10-CM | POA: Diagnosis not present

## 2019-11-13 DIAGNOSIS — R531 Weakness: Secondary | ICD-10-CM | POA: Diagnosis not present

## 2019-11-13 DIAGNOSIS — E249 Cushing's syndrome, unspecified: Secondary | ICD-10-CM | POA: Diagnosis not present

## 2019-11-13 DIAGNOSIS — J4551 Severe persistent asthma with (acute) exacerbation: Secondary | ICD-10-CM | POA: Diagnosis not present

## 2019-11-14 DIAGNOSIS — R2689 Other abnormalities of gait and mobility: Secondary | ICD-10-CM | POA: Diagnosis not present

## 2019-11-14 DIAGNOSIS — Z471 Aftercare following joint replacement surgery: Secondary | ICD-10-CM | POA: Diagnosis not present

## 2019-11-14 DIAGNOSIS — Z96652 Presence of left artificial knee joint: Secondary | ICD-10-CM | POA: Diagnosis not present

## 2019-11-14 DIAGNOSIS — M25662 Stiffness of left knee, not elsewhere classified: Secondary | ICD-10-CM | POA: Diagnosis not present

## 2019-11-14 DIAGNOSIS — M25562 Pain in left knee: Secondary | ICD-10-CM | POA: Diagnosis not present

## 2019-11-14 DIAGNOSIS — M6281 Muscle weakness (generalized): Secondary | ICD-10-CM | POA: Diagnosis not present

## 2019-11-16 DIAGNOSIS — R2689 Other abnormalities of gait and mobility: Secondary | ICD-10-CM | POA: Diagnosis not present

## 2019-11-16 DIAGNOSIS — Z471 Aftercare following joint replacement surgery: Secondary | ICD-10-CM | POA: Diagnosis not present

## 2019-11-16 DIAGNOSIS — M25562 Pain in left knee: Secondary | ICD-10-CM | POA: Diagnosis not present

## 2019-11-16 DIAGNOSIS — Z96652 Presence of left artificial knee joint: Secondary | ICD-10-CM | POA: Diagnosis not present

## 2019-11-16 DIAGNOSIS — M25662 Stiffness of left knee, not elsewhere classified: Secondary | ICD-10-CM | POA: Diagnosis not present

## 2019-11-16 DIAGNOSIS — M6281 Muscle weakness (generalized): Secondary | ICD-10-CM | POA: Diagnosis not present

## 2019-11-16 NOTE — Addendum Note (Signed)
Addendum  created 11/16/19 1538 by Belinda Block, MD   Clinical Note Signed, Intraprocedure Blocks edited

## 2019-11-20 DIAGNOSIS — M6281 Muscle weakness (generalized): Secondary | ICD-10-CM | POA: Diagnosis not present

## 2019-11-20 DIAGNOSIS — M25562 Pain in left knee: Secondary | ICD-10-CM | POA: Diagnosis not present

## 2019-11-20 DIAGNOSIS — M25662 Stiffness of left knee, not elsewhere classified: Secondary | ICD-10-CM | POA: Diagnosis not present

## 2019-11-20 DIAGNOSIS — Z471 Aftercare following joint replacement surgery: Secondary | ICD-10-CM | POA: Diagnosis not present

## 2019-11-20 DIAGNOSIS — Z96652 Presence of left artificial knee joint: Secondary | ICD-10-CM | POA: Diagnosis not present

## 2019-11-20 DIAGNOSIS — R2689 Other abnormalities of gait and mobility: Secondary | ICD-10-CM | POA: Diagnosis not present

## 2019-11-21 DIAGNOSIS — Z96652 Presence of left artificial knee joint: Secondary | ICD-10-CM | POA: Diagnosis not present

## 2019-11-21 DIAGNOSIS — Z471 Aftercare following joint replacement surgery: Secondary | ICD-10-CM | POA: Diagnosis not present

## 2019-11-21 DIAGNOSIS — M6281 Muscle weakness (generalized): Secondary | ICD-10-CM | POA: Diagnosis not present

## 2019-11-21 DIAGNOSIS — M25562 Pain in left knee: Secondary | ICD-10-CM | POA: Diagnosis not present

## 2019-11-21 DIAGNOSIS — R2689 Other abnormalities of gait and mobility: Secondary | ICD-10-CM | POA: Diagnosis not present

## 2019-11-21 DIAGNOSIS — M25662 Stiffness of left knee, not elsewhere classified: Secondary | ICD-10-CM | POA: Diagnosis not present

## 2019-11-23 DIAGNOSIS — M25562 Pain in left knee: Secondary | ICD-10-CM | POA: Diagnosis not present

## 2019-11-23 DIAGNOSIS — M25662 Stiffness of left knee, not elsewhere classified: Secondary | ICD-10-CM | POA: Diagnosis not present

## 2019-11-23 DIAGNOSIS — Z96652 Presence of left artificial knee joint: Secondary | ICD-10-CM | POA: Diagnosis not present

## 2019-11-23 DIAGNOSIS — R2689 Other abnormalities of gait and mobility: Secondary | ICD-10-CM | POA: Diagnosis not present

## 2019-11-23 DIAGNOSIS — Z471 Aftercare following joint replacement surgery: Secondary | ICD-10-CM | POA: Diagnosis not present

## 2019-11-23 DIAGNOSIS — M6281 Muscle weakness (generalized): Secondary | ICD-10-CM | POA: Diagnosis not present

## 2019-11-28 DIAGNOSIS — M5416 Radiculopathy, lumbar region: Secondary | ICD-10-CM | POA: Diagnosis not present

## 2019-11-29 DIAGNOSIS — R2689 Other abnormalities of gait and mobility: Secondary | ICD-10-CM | POA: Diagnosis not present

## 2019-11-29 DIAGNOSIS — M5416 Radiculopathy, lumbar region: Secondary | ICD-10-CM | POA: Diagnosis not present

## 2019-11-29 DIAGNOSIS — M6281 Muscle weakness (generalized): Secondary | ICD-10-CM | POA: Diagnosis not present

## 2019-12-03 DIAGNOSIS — I10 Essential (primary) hypertension: Secondary | ICD-10-CM | POA: Diagnosis not present

## 2019-12-03 DIAGNOSIS — Z6834 Body mass index (BMI) 34.0-34.9, adult: Secondary | ICD-10-CM | POA: Diagnosis not present

## 2019-12-03 DIAGNOSIS — M5416 Radiculopathy, lumbar region: Secondary | ICD-10-CM | POA: Diagnosis not present

## 2019-12-05 DIAGNOSIS — R062 Wheezing: Secondary | ICD-10-CM | POA: Diagnosis not present

## 2019-12-05 DIAGNOSIS — R7989 Other specified abnormal findings of blood chemistry: Secondary | ICD-10-CM | POA: Diagnosis not present

## 2019-12-05 DIAGNOSIS — R296 Repeated falls: Secondary | ICD-10-CM | POA: Diagnosis not present

## 2019-12-05 DIAGNOSIS — J45998 Other asthma: Secondary | ICD-10-CM | POA: Diagnosis not present

## 2019-12-05 DIAGNOSIS — R600 Localized edema: Secondary | ICD-10-CM | POA: Diagnosis not present

## 2019-12-05 DIAGNOSIS — J4551 Severe persistent asthma with (acute) exacerbation: Secondary | ICD-10-CM | POA: Diagnosis not present

## 2019-12-05 DIAGNOSIS — E249 Cushing's syndrome, unspecified: Secondary | ICD-10-CM | POA: Diagnosis not present

## 2019-12-05 DIAGNOSIS — R944 Abnormal results of kidney function studies: Secondary | ICD-10-CM | POA: Diagnosis not present

## 2019-12-05 DIAGNOSIS — G8929 Other chronic pain: Secondary | ICD-10-CM | POA: Diagnosis not present

## 2019-12-05 DIAGNOSIS — R531 Weakness: Secondary | ICD-10-CM | POA: Diagnosis not present

## 2019-12-05 DIAGNOSIS — M4800 Spinal stenosis, site unspecified: Secondary | ICD-10-CM | POA: Diagnosis not present

## 2019-12-05 DIAGNOSIS — K219 Gastro-esophageal reflux disease without esophagitis: Secondary | ICD-10-CM | POA: Diagnosis not present

## 2019-12-07 DIAGNOSIS — Z9181 History of falling: Secondary | ICD-10-CM | POA: Diagnosis not present

## 2019-12-07 DIAGNOSIS — J4550 Severe persistent asthma, uncomplicated: Secondary | ICD-10-CM | POA: Diagnosis not present

## 2019-12-07 DIAGNOSIS — Z96652 Presence of left artificial knee joint: Secondary | ICD-10-CM | POA: Diagnosis not present

## 2019-12-07 DIAGNOSIS — Z87891 Personal history of nicotine dependence: Secondary | ICD-10-CM | POA: Diagnosis not present

## 2019-12-07 DIAGNOSIS — D7219 Other eosinophilia: Secondary | ICD-10-CM | POA: Diagnosis not present

## 2019-12-10 DIAGNOSIS — F411 Generalized anxiety disorder: Secondary | ICD-10-CM | POA: Diagnosis not present

## 2019-12-10 DIAGNOSIS — M797 Fibromyalgia: Secondary | ICD-10-CM | POA: Diagnosis not present

## 2019-12-10 DIAGNOSIS — M1990 Unspecified osteoarthritis, unspecified site: Secondary | ICD-10-CM | POA: Diagnosis not present

## 2019-12-10 DIAGNOSIS — Z712 Person consulting for explanation of examination or test findings: Secondary | ICD-10-CM | POA: Diagnosis not present

## 2019-12-10 DIAGNOSIS — D509 Iron deficiency anemia, unspecified: Secondary | ICD-10-CM | POA: Diagnosis not present

## 2019-12-10 DIAGNOSIS — E1122 Type 2 diabetes mellitus with diabetic chronic kidney disease: Secondary | ICD-10-CM | POA: Diagnosis not present

## 2019-12-10 DIAGNOSIS — J45909 Unspecified asthma, uncomplicated: Secondary | ICD-10-CM | POA: Diagnosis not present

## 2019-12-10 DIAGNOSIS — M8580 Other specified disorders of bone density and structure, unspecified site: Secondary | ICD-10-CM | POA: Diagnosis not present

## 2019-12-10 DIAGNOSIS — E875 Hyperkalemia: Secondary | ICD-10-CM | POA: Diagnosis not present

## 2019-12-10 DIAGNOSIS — K219 Gastro-esophageal reflux disease without esophagitis: Secondary | ICD-10-CM | POA: Diagnosis not present

## 2019-12-10 DIAGNOSIS — I10 Essential (primary) hypertension: Secondary | ICD-10-CM | POA: Diagnosis not present

## 2019-12-11 DIAGNOSIS — M5416 Radiculopathy, lumbar region: Secondary | ICD-10-CM | POA: Diagnosis not present

## 2019-12-11 DIAGNOSIS — M6281 Muscle weakness (generalized): Secondary | ICD-10-CM | POA: Diagnosis not present

## 2019-12-11 DIAGNOSIS — R2689 Other abnormalities of gait and mobility: Secondary | ICD-10-CM | POA: Diagnosis not present

## 2019-12-13 DIAGNOSIS — J441 Chronic obstructive pulmonary disease with (acute) exacerbation: Secondary | ICD-10-CM | POA: Diagnosis not present

## 2019-12-13 DIAGNOSIS — M48061 Spinal stenosis, lumbar region without neurogenic claudication: Secondary | ICD-10-CM | POA: Diagnosis not present

## 2019-12-13 DIAGNOSIS — G720 Drug-induced myopathy: Secondary | ICD-10-CM | POA: Diagnosis not present

## 2019-12-13 DIAGNOSIS — N183 Chronic kidney disease, stage 3 unspecified: Secondary | ICD-10-CM | POA: Diagnosis not present

## 2019-12-13 DIAGNOSIS — R451 Restlessness and agitation: Secondary | ICD-10-CM | POA: Diagnosis not present

## 2019-12-29 DIAGNOSIS — M5136 Other intervertebral disc degeneration, lumbar region: Secondary | ICD-10-CM | POA: Diagnosis not present

## 2019-12-29 DIAGNOSIS — G894 Chronic pain syndrome: Secondary | ICD-10-CM | POA: Diagnosis not present

## 2020-01-02 DIAGNOSIS — D638 Anemia in other chronic diseases classified elsewhere: Secondary | ICD-10-CM | POA: Diagnosis not present

## 2020-01-02 DIAGNOSIS — E871 Hypo-osmolality and hyponatremia: Secondary | ICD-10-CM | POA: Diagnosis not present

## 2020-01-02 DIAGNOSIS — E6609 Other obesity due to excess calories: Secondary | ICD-10-CM | POA: Diagnosis not present

## 2020-01-02 DIAGNOSIS — I5032 Chronic diastolic (congestive) heart failure: Secondary | ICD-10-CM | POA: Diagnosis not present

## 2020-01-02 DIAGNOSIS — R768 Other specified abnormal immunological findings in serum: Secondary | ICD-10-CM | POA: Diagnosis not present

## 2020-01-02 DIAGNOSIS — Z79899 Other long term (current) drug therapy: Secondary | ICD-10-CM | POA: Diagnosis not present

## 2020-01-02 DIAGNOSIS — N1831 Chronic kidney disease, stage 3a: Secondary | ICD-10-CM | POA: Diagnosis not present

## 2020-01-02 DIAGNOSIS — I129 Hypertensive chronic kidney disease with stage 1 through stage 4 chronic kidney disease, or unspecified chronic kidney disease: Secondary | ICD-10-CM | POA: Diagnosis not present

## 2020-01-03 DIAGNOSIS — Z23 Encounter for immunization: Secondary | ICD-10-CM | POA: Diagnosis not present

## 2020-01-10 ENCOUNTER — Other Ambulatory Visit (HOSPITAL_COMMUNITY): Payer: Self-pay | Admitting: Nephrology

## 2020-01-10 DIAGNOSIS — D638 Anemia in other chronic diseases classified elsewhere: Secondary | ICD-10-CM

## 2020-01-10 DIAGNOSIS — I5032 Chronic diastolic (congestive) heart failure: Secondary | ICD-10-CM

## 2020-01-10 DIAGNOSIS — I129 Hypertensive chronic kidney disease with stage 1 through stage 4 chronic kidney disease, or unspecified chronic kidney disease: Secondary | ICD-10-CM

## 2020-01-12 NOTE — Progress Notes (Signed)
Referring Provider: Celene Squibb, MD Primary Care Physician:  Celene Squibb, MD Primary GI Physician: Dr. Gala Romney  Chief Complaint  Patient presents with  . Constipation  . Nausea    HPI:   Glenda Terry is a 79 y.o. female with history of GERD, dysphagia s/p EGD with normal-appearing esophagus with empiric dilation and medium size hiatal hernia in 2018, constipation, adenomatous colon polyps with last colonoscopy in 2019 revealing diverticula in the sigmoid and descending colon and 2 tubular adenomas.  No recommendations to repeat.  She presents today for follow-up.  Last seen in our office 04/20/2019.  Reported nocturnal reflux x3 weeks.  She was taken Nexium at least once daily and occasionally forgetting her second dose.  Reported mild early satiety intermittently.  Constipation was improved with eating a lot of "roughage".  She continue taking Linzess 290 mcg but was no longer needing MiraLAX.  No abdominal pain or dysphagia.  Advised to resume taking Nexium 40 mg twice daily, continue Pepcid at night, counseled on GERD diet/lifestyle, continue Linzess, requested progress report in 2 weeks and plan to follow-up in 6 months.  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 Seen in the emergency room at [facility] for acute, uncomplicated sigmoid diverticulitis 11/07/2019.  She was treated with Augmentin 875-125 twice daily x10 days.  Today:  Struggling with constipation. Taking Linzess 290 mcg daily and MiraLAX daily. Also taking dulcolax stool softener, 3 day. Some lower abdominal pain with constipation. States this is not the same as when she had diverticulitis. Pain improves with moving her bowels. Bowels are moving every other day but stools are hard and she isn't emptying well. Having to strain. No blood in the stool. No black stools. Previously on Amitiza which didn't work well.   Oxycodone recently increased from 5-10mg  in April or May.   Nausea: Started after knee replacement. Rare  vomiting, maybe once a month. Nausea is daily. Sometimes caused by taking medications on empty stomach. Also triggered by eating. No GERD symptoms or upper abdominal pain. Taking Nexium twice daily. Had zofran  After left knee replacement. Hasn't had Zofran for a while. Continues with early satiety. No dysphagia. No fried foods. No soda. Trying to lose weight. States she keeps busy so she doesn't eat as much. Also trying to walk more.   Discussed possible repeat colonoscopy due to diverticulitis.  Patient declined.   Past Medical History:  Diagnosis Date  . Anxiety   . Asthma   . Back pain, chronic   . CKD (chronic kidney disease) stage 3, GFR 30-59 ml/min (HCC)   . Compression fx, thoracic spine (HCC)    T - 11  . COPD (chronic obstructive pulmonary disease) ([place]) 12/16/2015  . 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 Essential hypertension   . GERD (gastroesophageal reflux disease)   . HOH (hard of hearing)   . Inflammatory arthritis   . Iron deficiency anemia 01/2012  . Pulmonary eosinophilia ([place])   . Spinal stenosis   . Tubular adenoma 11/2012    Past Surgical History:  Procedure Laterality Date  . ABDOMINAL HYSTERECTOMY    . BACK SURGERY    . CARPAL TUNNEL RELEASE Right 4/14  . CATARACT EXTRACTION W/PHACO Left 11/19/2014   Procedure: CATARACT EXTRACTION PHACO AND INTRAOCULAR LENS PLACEMENT (IOC);  Surgeon: Williams Che, MD;  Location: [facility];  Service: Ophthalmology;  Laterality: Left;  CDE: 4.77  . CATARACT EXTRACTION W/PHACO Right 02/03/2015   Procedure: CATARACT EXTRACTION PHACO AND INTRAOCULAR LENS  PLACEMENT (IOC);  Surgeon: Williams Che, MD;  Location: [facility];  Service: Ophthalmology;  Laterality: Right;  CDE: 4.54  . COLONOSCOPY  06/19/2008   RMR: tortuous and elongated colon with scattered left-sided diverticula/colonic mucosa appeared entirely normal. Prior colonic ulcers had resolved.  . COLONOSCOPY  12/2007   Dr. Gala Romney: Scattered diffuse sigmoid diverticula, 2 areas of  ulceration at the hepatic flexure. Biopsies unremarkable.  . COLONOSCOPY N/A 11/20/2012   next TCS 11/2017  . COLONOSCOPY WITH PROPOFOL N/A 12/22/2017   Procedure: COLONOSCOPY WITH PROPOFOL;  Surgeon: Daneil Dolin, MD; diverticula in the sigmoid and descending colon and 2 tubular adenomas.  No recommendations to repeat.    . DECOMPRESSIVE LUMBAR LAMINECTOMY LEVEL 2  04/20/2012   Procedure: DECOMPRESSIVE LUMBAR LAMINECTOMY LEVEL 2;  Surgeon: Tobi Bastos, MD;  Location: [facility];  Service: Orthopedics;  Laterality: Right;  Decompressive Lumbar Laminectomy of the L4 - L5 and L5 - S1 Complete/Laminectomy L5 on the Right (X-Ray)  . ESOPHAGOGASTRODUODENOSCOPY  12/2007   Dr. 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 Small hiatal hernia. Slightly pale duodenal mucosa (biopsy unremarkable)  . ESOPHAGOGASTRODUODENOSCOPY (EGD) WITH PROPOFOL N/A 01/20/2017   Dr. Gala Romney: Medium sized hiatal hernia empiric esophageal dilation for history of dysphagia  . EYE SURGERY     BIL CATARACTS  . IR KYPHO THORACIC WITH BONE BIOPSY  02/08/2017  . IR RADIOLOGIST EVAL & MGMT  02/02/2017  . JOINT REPLACEMENT    . KNEE ARTHROSCOPY Right   . MALONEY DILATION N/A 01/20/2017   Procedure: Venia Minks DILATION;  Surgeon: Daneil Dolin, MD;  [facility];  Service: Endoscopy;  Laterality: N/A;  . POLYPECTOMY  12/22/2017   Procedure: POLYPECTOMY;  Surgeon: Daneil Dolin, MD;  Location: [facility];  Service: Endoscopy;;  . REVERSE SHOULDER ARTHROPLASTY Right 05/24/2014   Procedure: RIGHT SHOULDER REVERSE ARTHROPLASTY;  Surgeon: Netta Cedars, MD;  Location: [facility];  Service: Orthopedics;  Laterality: Right;  . REVERSE SHOULDER ARTHROPLASTY Left 06/10/2017   Procedure: LEFT REVERSE SHOULDER ARTHROPLASTY;  Surgeon: Netta Cedars, MD;  Location: [facility];  Service: Orthopedics;  Laterality: Left;  . TOTAL KNEE ARTHROPLASTY  01/24/2012   Procedure: TOTAL KNEE ARTHROPLASTY;   Surgeon: Gearlean Alf, MD;  Location: [facility];  Service: Orthopedics;  Laterality: Right;  . TOTAL KNEE ARTHROPLASTY Left 10/15/2019   Procedure: TOTAL KNEE ARTHROPLASTY;  Surgeon: Gaynelle Arabian, MD;  Location: [facility];  Service: Orthopedics;  Laterality: Left;  3min  . TUBAL LIGATION      Current Outpatient Medications  Medication Sig Dispense Refill  . albuterol (PROAIR HFA) 108 (90 Base) MCG/ACT inhaler Inhale 2 puffs into the lungs every 6 (six) hours as needed for wheezing or shortness of breath (For COPD).     Marland Kitchen ALPRAZolam (XANAX) 0.5 MG tablet Take 0.5 tablets (0.25 mg total) by mouth 2 (two) times daily as needed for anxiety. 10 tablet 0  . Benralizumab (FASENRA) 30 MG/ML SOSY Inject 30 mg into the skin. Once A Day Every 56 Days    . denosumab (PROLIA) 60 MG/ML SOSY injection Inject 60 mg into the skin every 6 (six) months.    . docusate sodium (COLACE) 100 MG capsule Take 100 mg by mouth every other day. In the afternoon    . DULERA 200-5 MCG/ACT AERO Inhale 2 puffs into the lungs 2 (two) times daily. 8.8 g 0  . esomeprazole (NEXIUM) 40 MG capsule Take 1 capsule (40 mg total)  by mouth 2 (two) times daily. 60 capsule 0  . ferrous sulfate 325 (65 FE) MG tablet Take 1 tablet (325 mg total) by mouth daily with breakfast. 30 tablet 0  . fexofenadine (ALLEGRA) 180 MG tablet Take 180 mg by mouth daily.    . fluticasone (FLONASE) 50 MCG/ACT nasal spray Place 1 spray into both nostrils daily as needed for allergies or rhinitis.     Marland Kitchen ketotifen (ZADITOR) 0.025 % ophthalmic solution Place 1 drop into both eyes 2 (two) times daily as needed (allergies). 5 mL 0  . linaclotide (LINZESS) 290 MCG CAPS capsule TAKE 1 CAPSULE BY MOUTH DAILY BEFORE BREAKFAST 30 capsule 0  . Menthol, Topical Analgesic, (BIOFREEZE) 4 % GEL Apply 1 application topically 2 (two) times daily. Special Instructions: for back pain    . montelukast (SINGULAIR) 10 MG tablet Take 1 tablet (10 mg total) by mouth at bedtime. 30  tablet 0  . olmesartan (BENICAR) 40 MG tablet Take 1 tablet (40 mg total) by mouth daily. 30 tablet 0  . Oxycodone HCl 10 MG TABS Take 10 mg by mouth every 6 (six) hours.    Vladimir Faster Glycol-Propyl Glycol (SYSTANE OP) Place 1 drop into both eyes 3 (three) times daily as needed (dry/irritated eyes.).     Marland Kitchen polyethylene glycol (MIRALAX / GLYCOLAX) packet Take 17 g by mouth daily as needed (constipation.).     Marland Kitchen potassium chloride (KLOR-CON) 10 MEQ tablet Take 1 tablet (10 mEq total) by mouth every other day. IN THE MORNING 15 tablet 0  . torsemide (DEMADEX) 10 MG tablet Take 1 tablet (10 mg total) by mouth every other day. IN THE MORNING 15 tablet 0  . naloxegol oxalate (MOVANTIK) 12.5 MG TABS tablet Take 1 tablet (12.5 mg total) by mouth daily. 30 tablet 3  . ondansetron (ZOFRAN) 4 MG tablet Take 1 tablet (4 mg total) by mouth every 8 (eight) hours as needed for nausea or vomiting. 30 tablet 0   No current facility-administered medications for this visit.    Allergies as of 01/14/2020 - Review Complete 01/14/2020  Allergen Reaction Noted  . Flexeril [cyclobenzaprine] Anaphylaxis 01/12/2012  . Other Anaphylaxis and Other (See Comments) 02/08/2011  . Keflex [cephalexin] Itching 05/26/2012  . Aspirin Other (See Comments) 05/05/2015  . Statins Swelling and Other (See Comments) 05/17/2011  . Adhesive [tape] Rash 11/01/2014  . Chlorhexidine Rash 11/19/2014    Family History  Problem Relation Age of Onset  . Breast cancer Sister   . Colon cancer Sister        two deceased, one living and terminal, one current undergoing treatment, ages 60, 45, 40, 45  . Hypertension Mother   . Stroke Mother   . Heart attack Mother   . Hypertension Father   . Prostate cancer Father     Social History   Socioeconomic History  . Marital status: Single    Spouse name: Not on file  . Number of children: 3  . Years of education: Not on file  . Highest education level: Not on file  Occupational History   . Not on file  Tobacco Use  . Smoking status: Former Smoker    Packs/day: 2.00    Years: 40.00    Pack years: 80.00    Types: Cigarettes    Quit date: 01/19/1992    Years since quitting: 28.0  . Smokeless tobacco: Never Used  Vaping Use  . Vaping Use: Never used  Substance and Sexual Activity  . Alcohol  use: No  . Drug use: No  . Sexual activity: Never  Other Topics Concern  . Not on file  Social History Narrative  . Not on file   Social Determinants of Health   Financial Resource Strain:   . Difficulty of Paying Living Expenses: Not on file  Food Insecurity:   . Worried About Charity fundraiser in the Last Year: Not on file  . Ran Out of Food in the Last Year: Not on file  Transportation Needs:   . Lack of Transportation (Medical): Not on file  . Lack of Transportation (Non-Medical): Not on file  Physical Activity:   . Days of Exercise per Week: Not on file  . Minutes of Exercise per Session: Not on file  Stress:   . Feeling of Stress : Not on file  Social Connections:   . Frequency of Communication with Friends and Family: Not on file  . Frequency of Social Gatherings with Friends and Family: Not on file  . Attends Religious Services: Not on file  . Active Member of Clubs or Organizations: Not on file  . Attends Archivist Meetings: Not on file  . Marital Status: Not on file    Review of Systems: Gen: Denies fever, chills, cold or flulike symptoms, presyncope, syncope. CV: Denies chest pain or palpitations Resp: Denies dyspnea or cough GI: See HPI Heme: See HPI  Physical Exam: BP 115/70   Pulse 96   Temp (!) 96.6 F (35.9 C) (Temporal)   Ht 5' (1.524 m)   Wt 177 lb 9.6 oz (80.6 kg)   BMI 34.69 kg/m  General:   Alert and oriented. No distress noted. Pleasant and cooperative.  Head:  Normocephalic and atraumatic. Eyes:  Conjuctiva clear without scleral icterus. Heart:  S1, S2 present without murmurs appreciated. Lungs:  Clear to  auscultation bilaterally. No wheezes, rales, or rhonchi. No distress.  Abdomen:  +BS, soft, and non-distended.  Minimal tenderness to palpation across lower abdomen.  No rebound or guarding. No HSM or masses noted.  Msk:  Symmetrical without gross deformities. Normal posture. Extremities:  Without edema. Neurologic:  Alert and  oriented x4 Psych: Normal mood and affect.

## 2020-01-14 ENCOUNTER — Ambulatory Visit (INDEPENDENT_AMBULATORY_CARE_PROVIDER_SITE_OTHER): Payer: PPO | Admitting: Gastroenterology

## 2020-01-14 ENCOUNTER — Encounter: Payer: Self-pay | Admitting: Gastroenterology

## 2020-01-14 ENCOUNTER — Other Ambulatory Visit: Payer: Self-pay

## 2020-01-14 VITALS — BP 115/70 | HR 96 | Temp 96.6°F | Ht 60.0 in | Wt 177.6 lb

## 2020-01-14 DIAGNOSIS — K5909 Other constipation: Secondary | ICD-10-CM

## 2020-01-14 DIAGNOSIS — K219 Gastro-esophageal reflux disease without esophagitis: Secondary | ICD-10-CM | POA: Diagnosis not present

## 2020-01-14 DIAGNOSIS — K5903 Drug induced constipation: Secondary | ICD-10-CM

## 2020-01-14 DIAGNOSIS — R6881 Early satiety: Secondary | ICD-10-CM

## 2020-01-14 DIAGNOSIS — R112 Nausea with vomiting, unspecified: Secondary | ICD-10-CM

## 2020-01-14 DIAGNOSIS — T402X5A Adverse effect of other opioids, initial encounter: Secondary | ICD-10-CM

## 2020-01-14 HISTORY — DX: Nausea with vomiting, unspecified: R11.2

## 2020-01-14 MED ORDER — ONDANSETRON HCL 4 MG PO TABS: 4.0000 mg | ORAL_TABLET | Freq: Three times a day (TID) | ORAL | 0 refills | Status: DC | PRN

## 2020-01-14 MED ORDER — NALOXEGOL OXALATE 12.5 MG PO TABS: 12.5000 mg | ORAL_TABLET | Freq: Every day | ORAL | 3 refills | Status: DC

## 2020-01-14 NOTE — Patient Instructions (Addendum)
Please have gastric emptying study to help evaluate your nausea.   For constipation, I would like for you to try Movantik 12.5 mg daily. I have sent a prescription to your pharmacy. When you start Movantik, stop Linzess. Please let us know if he has any trouble getting Movantik due to cost.  Continue taking Nexium 40 mg twice daily for acid reflux.  Follow a GERD diet:  Avoid fried, fatty, greasy, spicy, citrus foods. Avoid caffeine and carbonated beverages. Avoid chocolate. Try eating 4-6 small meals a day rather than 3 large meals. Do not eat within 3 hours of laying down. Prop head of bed up on wood or bricks to create a 6 inch incline.  Please call with a progress report regarding constipation in 2-4 weeks.   We will see you back in 2 months.   Aliene Altes, PA-C [facility]

## 2020-01-17 ENCOUNTER — Ambulatory Visit (HOSPITAL_COMMUNITY): Payer: PPO

## 2020-01-17 ENCOUNTER — Encounter: Payer: Self-pay | Admitting: Gastroenterology

## 2020-01-17 NOTE — Assessment & Plan Note (Signed)
Typical GERD symptoms are well controlled on Nexium 40 mg twice daily.  Advise she continue her current medications.  Also discussed GERD diet/lifestyle.

## 2020-01-17 NOTE — Assessment & Plan Note (Addendum)
Patient reports new onset of nausea with rare vomiting (maybe once a month) that began after her knee replacement on 10/15/2019.  Nausea is triggered daily by either taking medications on empty stomach or with eating.  Notably, she has also had significant worsening of constipation since her knee replacement and increase in oxycodone from 5 to 10 mg.  Also with ongoing early satiety which is more chronic.  She has been losing weight but states she is trying.  GERD is well controlled on Nexium 40 mg twice daily.  Denies upper abdominal pain but does have mild lower abdominal pain that improves with bowel movements.  Last EGD was in 2018 with normal-appearing esophagus s/p dilation, medium size hiatal hernia.  Suspect worsening constipation and pain medications are likely influencing nausea.  Could also have gastroparesis.  Symptoms are not classically biliary.  We will start with trying to improve constipation and will evaluate for gastroparesis.  If symptoms persist, she will need additional evaluation.  Plan: GES Stop Linzess and trial Movantik.  We will start with Movantik 12.5 mg daily due to renal insufficiency. May continue MiraLAX 1 capful (17 g) daily as needed. Requested progress report on constipation in 2-4 weeks. Zofran 4 mg every 8 hours as needed for supportive measure while we are working this up.  Continue Nexium 40 mg twice daily. Follow-up in 2 months.

## 2020-01-17 NOTE — Assessment & Plan Note (Addendum)
Chronic history of constipation.  Symptoms are now adequately managed.  I suspect her chronic narcotic use is likely influencing symptoms.  Notably, oxycodone was recently increased from 5 to 10 mg in April or May.  At her last office visit in February, constipation was doing well on Linzess 290 mcg daily.  Currently, she is taking Linzess 290 mg daily, MiraLAX daily, and stool softener daily but continues with hard, incomplete bowel movements.  Denies BRBPR or melena.  She has lost some weight but states she has been trying.  Also had an episode of diverticulitis in August 2021.  She does have some mild lower abdominal pain but notes this improves with moving her bowels and does not feel similar to prior diverticulitis episode.  Discussed possibility of repeat colonoscopy due to episode of diverticulitis as her last colonoscopy was in 2019, but patient declined.  Plan: Stop Linzess and trial Movantik.  Will start at 12.5 mg due to renal insufficiency. May continue MiraLAX 1 capful (17 g) daily in 8 ounces of water as needed. Requested progress report in 2 to 4 weeks. Follow-up in 2 months.

## 2020-01-21 ENCOUNTER — Ambulatory Visit (HOSPITAL_COMMUNITY)
Admission: RE | Admit: 2020-01-21 | Discharge: 2020-01-21 | Disposition: A | Discharge status: Home / Self Care | Payer: PPO | Source: Ambulatory Visit | Attending: Nephrology | Admitting: Nephrology

## 2020-01-21 ENCOUNTER — Other Ambulatory Visit: Payer: Self-pay

## 2020-01-21 DIAGNOSIS — I129 Hypertensive chronic kidney disease with stage 1 through stage 4 chronic kidney disease, or unspecified chronic kidney disease: Secondary | ICD-10-CM

## 2020-01-21 DIAGNOSIS — E559 Vitamin D deficiency, unspecified: Secondary | ICD-10-CM | POA: Diagnosis not present

## 2020-01-21 DIAGNOSIS — I5032 Chronic diastolic (congestive) heart failure: Secondary | ICD-10-CM

## 2020-01-21 DIAGNOSIS — D638 Anemia in other chronic diseases classified elsewhere: Secondary | ICD-10-CM

## 2020-01-21 DIAGNOSIS — Z79899 Other long term (current) drug therapy: Secondary | ICD-10-CM | POA: Diagnosis not present

## 2020-01-21 DIAGNOSIS — Z1159 Encounter for screening for other viral diseases: Secondary | ICD-10-CM | POA: Diagnosis not present

## 2020-01-21 DIAGNOSIS — R809 Proteinuria, unspecified: Secondary | ICD-10-CM | POA: Diagnosis not present

## 2020-01-21 DIAGNOSIS — N183 Chronic kidney disease, stage 3 unspecified: Secondary | ICD-10-CM | POA: Diagnosis not present

## 2020-01-22 ENCOUNTER — Other Ambulatory Visit (HOSPITAL_COMMUNITY): Payer: PPO

## 2020-01-24 NOTE — Progress Notes (Signed)
Cc'ed to pcp °

## 2020-01-26 DIAGNOSIS — R1031 Right lower quadrant pain: Secondary | ICD-10-CM | POA: Diagnosis not present

## 2020-01-30 DIAGNOSIS — D638 Anemia in other chronic diseases classified elsewhere: Secondary | ICD-10-CM | POA: Diagnosis not present

## 2020-01-30 DIAGNOSIS — R768 Other specified abnormal immunological findings in serum: Secondary | ICD-10-CM | POA: Diagnosis not present

## 2020-01-30 DIAGNOSIS — N1831 Chronic kidney disease, stage 3a: Secondary | ICD-10-CM | POA: Diagnosis not present

## 2020-01-30 DIAGNOSIS — I5032 Chronic diastolic (congestive) heart failure: Secondary | ICD-10-CM | POA: Diagnosis not present

## 2020-01-30 DIAGNOSIS — R7303 Prediabetes: Secondary | ICD-10-CM | POA: Diagnosis not present

## 2020-01-30 DIAGNOSIS — E211 Secondary hyperparathyroidism, not elsewhere classified: Secondary | ICD-10-CM | POA: Diagnosis not present

## 2020-01-30 DIAGNOSIS — I129 Hypertensive chronic kidney disease with stage 1 through stage 4 chronic kidney disease, or unspecified chronic kidney disease: Secondary | ICD-10-CM | POA: Diagnosis not present

## 2020-02-01 DIAGNOSIS — Z79899 Other long term (current) drug therapy: Secondary | ICD-10-CM | POA: Diagnosis not present

## 2020-02-01 DIAGNOSIS — J4551 Severe persistent asthma with (acute) exacerbation: Secondary | ICD-10-CM | POA: Diagnosis not present

## 2020-02-01 DIAGNOSIS — K219 Gastro-esophageal reflux disease without esophagitis: Secondary | ICD-10-CM | POA: Diagnosis not present

## 2020-02-01 DIAGNOSIS — D7219 Other eosinophilia: Secondary | ICD-10-CM | POA: Diagnosis not present

## 2020-02-01 DIAGNOSIS — Z87891 Personal history of nicotine dependence: Secondary | ICD-10-CM | POA: Diagnosis not present

## 2020-02-05 ENCOUNTER — Other Ambulatory Visit: Payer: Self-pay

## 2020-02-05 ENCOUNTER — Encounter (HOSPITAL_COMMUNITY)
Admission: RE | Admit: 2020-02-05 | Discharge: 2020-02-05 | Disposition: A | Discharge status: Home / Self Care | Payer: PPO | Source: Ambulatory Visit | Attending: Gastroenterology | Admitting: Gastroenterology

## 2020-02-05 DIAGNOSIS — R112 Nausea with vomiting, unspecified: Secondary | ICD-10-CM | POA: Diagnosis not present

## 2020-02-05 DIAGNOSIS — R6881 Early satiety: Secondary | ICD-10-CM | POA: Insufficient documentation

## 2020-02-05 DIAGNOSIS — K219 Gastro-esophageal reflux disease without esophagitis: Secondary | ICD-10-CM | POA: Diagnosis not present

## 2020-02-05 MED ORDER — TECHNETIUM TC 99M SULFUR COLLOID
2.0000 | Freq: Once | INTRAVENOUS | Status: AC | PRN
  Administered 2020-02-05: 2 via INTRAVENOUS

## 2020-02-06 ENCOUNTER — Telehealth: Payer: Self-pay | Admitting: Internal Medicine

## 2020-02-06 NOTE — Telephone Encounter (Signed)
Pt called asking to speak with the nurse. Please call [phone] Medication is not working and results of swallowing test.

## 2020-02-06 NOTE — Telephone Encounter (Signed)
Spoke with pt. Pt was notified of Glenda Sites, NP recommendations. Pt will try Movantik 24 mg and let our office know if this works for her.

## 2020-02-06 NOTE — Telephone Encounter (Signed)
Spoke with pt. Pt is taking Movantik 12.5 mg as directed daily per Aliene Altes, PA. Pt is having a BM daily to qod and her stool is hard. Pt takes Colace 50 mg (3 tabs) in the evenings when needed only and it makes pts stool soft. Please advise what pt should take daily per pt. Please advise in the absence of Aliene Altes, [place].

## 2020-02-06 NOTE — Telephone Encounter (Signed)
She was started on 12.5 dose due to CrCl just under 60 (at 57). No adverse effects and some persistent constipation requiring 3 colage in the evening.  At this point let's have her take 25 mg Movantik to see if she does better. Call for any issues with diarrhea, abdominal cramping or if symptoms persist despite increased dose.  Cc: Aliene Altes for [facility] and further recommendations

## 2020-02-11 ENCOUNTER — Telehealth: Payer: Self-pay | Admitting: Internal Medicine

## 2020-02-11 ENCOUNTER — Other Ambulatory Visit: Payer: Self-pay | Admitting: Gastroenterology

## 2020-02-11 DIAGNOSIS — T402X5A Adverse effect of other opioids, initial encounter: Secondary | ICD-10-CM

## 2020-02-11 MED ORDER — NALOXEGOL OXALATE 25 MG PO TABS: 25.0000 mg | ORAL_TABLET | Freq: Every day | ORAL | 3 refills | Status: DC

## 2020-02-11 NOTE — Telephone Encounter (Signed)
See GES result note. Patient requested new Rx for Movantik 25 mg daily to be sent to pharmacy. This has been completed. I have requested she call with a progress report in 2-4 weeks.

## 2020-02-11 NOTE — Telephone Encounter (Signed)
Pt called to see if her test results were available. [phone]

## 2020-02-27 DIAGNOSIS — J4551 Severe persistent asthma with (acute) exacerbation: Secondary | ICD-10-CM | POA: Diagnosis not present

## 2020-02-29 ENCOUNTER — Emergency Department (HOSPITAL_COMMUNITY)
Admission: EM | Admit: 2020-02-29 | Discharge: 2020-02-29 | Disposition: A | Discharge status: Home / Self Care | Payer: PPO | Attending: Emergency Medicine | Admitting: Emergency Medicine

## 2020-02-29 ENCOUNTER — Encounter (HOSPITAL_COMMUNITY): Payer: Self-pay

## 2020-02-29 ENCOUNTER — Other Ambulatory Visit: Payer: Self-pay

## 2020-02-29 ENCOUNTER — Emergency Department (HOSPITAL_COMMUNITY): Payer: PPO

## 2020-02-29 DIAGNOSIS — Z87891 Personal history of nicotine dependence: Secondary | ICD-10-CM | POA: Diagnosis not present

## 2020-02-29 DIAGNOSIS — I129 Hypertensive chronic kidney disease with stage 1 through stage 4 chronic kidney disease, or unspecified chronic kidney disease: Secondary | ICD-10-CM | POA: Diagnosis not present

## 2020-02-29 DIAGNOSIS — Z96612 Presence of left artificial shoulder joint: Secondary | ICD-10-CM | POA: Insufficient documentation

## 2020-02-29 DIAGNOSIS — Z20822 Contact with and (suspected) exposure to covid-19: Secondary | ICD-10-CM | POA: Diagnosis not present

## 2020-02-29 DIAGNOSIS — N183 Chronic kidney disease, stage 3 unspecified: Secondary | ICD-10-CM | POA: Insufficient documentation

## 2020-02-29 DIAGNOSIS — K449 Diaphragmatic hernia without obstruction or gangrene: Secondary | ICD-10-CM | POA: Diagnosis not present

## 2020-02-29 DIAGNOSIS — Z96653 Presence of artificial knee joint, bilateral: Secondary | ICD-10-CM | POA: Insufficient documentation

## 2020-02-29 DIAGNOSIS — J449 Chronic obstructive pulmonary disease, unspecified: Secondary | ICD-10-CM | POA: Diagnosis not present

## 2020-02-29 DIAGNOSIS — Z79899 Other long term (current) drug therapy: Secondary | ICD-10-CM | POA: Diagnosis not present

## 2020-02-29 DIAGNOSIS — R059 Cough, unspecified: Secondary | ICD-10-CM | POA: Diagnosis not present

## 2020-02-29 DIAGNOSIS — R0602 Shortness of breath: Secondary | ICD-10-CM | POA: Diagnosis not present

## 2020-02-29 DIAGNOSIS — J4541 Moderate persistent asthma with (acute) exacerbation: Secondary | ICD-10-CM | POA: Insufficient documentation

## 2020-02-29 DIAGNOSIS — Z96611 Presence of right artificial shoulder joint: Secondary | ICD-10-CM | POA: Diagnosis not present

## 2020-02-29 LAB — RESP PANEL BY RT-PCR (FLU A&B, COVID) ARPGX2
Influenza A by PCR: NEGATIVE
Influenza B by PCR: NEGATIVE
SARS Coronavirus 2 by RT PCR: NEGATIVE

## 2020-02-29 MED ORDER — BENZONATATE 100 MG PO CAPS: 100.0000 mg | ORAL_CAPSULE | Freq: Three times a day (TID) | ORAL | 0 refills | Status: DC

## 2020-02-29 MED ORDER — IPRATROPIUM-ALBUTEROL 0.5-2.5 (3) MG/3ML IN SOLN
3.0000 mL | Freq: Once | RESPIRATORY_TRACT | Status: AC
  Administered 2020-02-29: 14:00:00 3 mL via RESPIRATORY_TRACT
  Filled 2020-02-29: qty 3

## 2020-02-29 MED ORDER — BENZONATATE 100 MG PO CAPS
100.0000 mg | ORAL_CAPSULE | Freq: Once | ORAL | Status: AC
  Administered 2020-02-29: 14:00:00 100 mg via ORAL
  Filled 2020-02-29: qty 1

## 2020-02-29 NOTE — ED Triage Notes (Addendum)
Pt brought in by daughter. Pt has been coughing and SOB which has progressively gotten worse over the week. Had car visit with DR [name] Crane on Wednesday. Pt negative for covid on wednesday

## 2020-02-29 NOTE — ED Notes (Signed)
Pt reports asthma

## 2020-02-29 NOTE — Discharge Instructions (Signed)
Continue to use your asthma treatments.  Follow-up with your doctors at [facility].  Return for any new or worse symptoms.  As we stated chest x-ray clear here today Covid testing influenza testing negative.  Tessalon Perle prescription provided printed so that gives you options of where to get it filled since it is a Friday afternoon late.

## 2020-02-29 NOTE — ED Provider Notes (Signed)
[facility] EMERGENCY DEPARTMENT Provider Note   CSN: [id number] Arrival date & time: 02/29/20  1138     History Chief Complaint  Patient presents with  . Shortness of Breath    Glenda Terry is a 79 y.o. female.  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  Patient recently had modification by Dr. [name] Crane on her medications normally followed by pulmonary at [facility].  Patient feels it is made things worse.  Patient's been having trouble with cough.  Covid testing yesterday negative retested here negative also negative for influenza.  Chest x-ray without any acute findings.  But patient with a persistent cough which makes her short of breath and when she is coughing hard she does have some wheezing.  Denies any fevers.  Denies any current upper respiratory symptoms.  But she did have a little bit of runny nose about a week ago.  With the coughing she feels short of breath.  In addition patient states that her doctors at [facility] do not want her on steroids.  Had contacted them and they had said they would call in some Tessalon Perles.  But since she was coughing so much they recommended evaluation in the emergency department.  Patient's oxygen saturations here on room air in the upper 90s.  No hypoxia at all.        Past Medical History:  Diagnosis Date  . Anxiety   . Asthma   . Back pain, chronic   . CKD (chronic kidney disease) stage 3, GFR 30-59 ml/min (HCC)   . Compression fx, thoracic spine (HCC)    T - 11  . COPD (chronic obstructive pulmonary disease) ([place]) 12/16/2015  . 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 Essential hypertension   . GERD (gastroesophageal reflux disease)   . HOH (hard of hearing)   . Inflammatory arthritis   . Iron deficiency anemia 01/2012  . Pulmonary eosinophilia ([place])   . Spinal stenosis   . Tubular adenoma 11/2012     Patient Active Problem List   Diagnosis Date Noted  . Nausea with vomiting 01/14/2020  . Chronic bilateral low back pain 10/25/2019  . Chronic constipation 10/22/2019  . Bilateral lower extremity edema 10/22/2019  . Chronic anemia 10/22/2019  . 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 Tachycardia 10/18/2019  . Primary osteoarthritis of knee 10/16/2019  . Primary osteoarthritis of left knee 10/15/2019  . Left lower quadrant abdominal pain 02/16/2019  . Hx of adenomatous colonic polyps 11/28/2017  . 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 Eosinophil count raised 06/09/2016  . Severe persistent asthma 06/07/2016  . COPD (chronic obstructive pulmonary disease) ([place]) 12/16/2015  . Asthma exacerbation 09/20/2015  . Acute respiratory failure ([place]) 09/20/2015  . CKD (chronic kidney disease) stage 3, GFR 30-59 ml/min (HCC) 09/20/2015  . Hemorrhoids, internal 08/21/2015  . 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 Microcytic anemia 11/01/2012  . Family hx of colon cancer 11/01/2012  . 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 Herniated lumbar intervertebral disc 04/20/2012  . Difficulty in walking(719.7) 03/13/2012  . Stiffness of joint, not elsewhere classified, lower leg 03/13/2012  . Weakness of right leg 03/13/2012  . OA (osteoarthritis) of knee 01/24/2012  . Anxiety 02/09/2011  . HTN (hypertension) 02/09/2011  . Arthritis 02/09/2011  . GERD (gastroesophageal reflux disease) 02/09/2011  . Rheumatoid arthritis ([place]) 02/09/2011  Past Surgical History:  Procedure Laterality Date  . ABDOMINAL HYSTERECTOMY    . BACK SURGERY    . CARPAL TUNNEL RELEASE Right 4/14  . CATARACT EXTRACTION W/PHACO Left 11/19/2014   Procedure: CATARACT EXTRACTION PHACO AND INTRAOCULAR LENS PLACEMENT (IOC);  Surgeon: Williams Che, MD;  Location: [facility];  Service: Ophthalmology;  Laterality: Left;  CDE: 4.77  . CATARACT EXTRACTION W/PHACO Right 02/03/2015    Procedure: CATARACT EXTRACTION PHACO AND INTRAOCULAR LENS PLACEMENT ([place]);  Surgeon: Williams Che, MD;  Location: [facility];  Service: Ophthalmology;  Laterality: Right;  CDE: 4.54  . COLONOSCOPY  06/19/2008   RMR: tortuous and elongated colon with scattered left-sided diverticula/colonic mucosa appeared entirely normal. Prior colonic ulcers had resolved.  . COLONOSCOPY  12/2007   Dr. Gala Romney: Scattered diffuse sigmoid diverticula, 2 areas of ulceration at the hepatic flexure. Biopsies unremarkable.  . COLONOSCOPY N/A 11/20/2012   next TCS 11/2017  . COLONOSCOPY WITH PROPOFOL N/A 12/22/2017   Procedure: COLONOSCOPY WITH PROPOFOL;  Surgeon: Daneil Dolin, MD; diverticula in the sigmoid and descending colon and 2 tubular adenomas.  No recommendations to repeat.    . DECOMPRESSIVE LUMBAR LAMINECTOMY LEVEL 2  04/20/2012   Procedure: DECOMPRESSIVE LUMBAR LAMINECTOMY LEVEL 2;  Surgeon: Tobi Bastos, MD;  Location: [facility];  Service: Orthopedics;  Laterality: Right;  Decompressive Lumbar Laminectomy of the L4 - L5 and L5 - S1 Complete/Laminectomy L5 on the Right (X-Ray)  . ESOPHAGOGASTRODUODENOSCOPY  12/2007   Dr. 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 Small hiatal hernia. Slightly pale duodenal mucosa (biopsy unremarkable)  . ESOPHAGOGASTRODUODENOSCOPY (EGD) WITH PROPOFOL N/A 01/20/2017   Dr. Gala Romney: Medium sized hiatal hernia empiric esophageal dilation for history of dysphagia  . EYE SURGERY     BIL CATARACTS  . IR KYPHO THORACIC WITH BONE BIOPSY  02/08/2017  . IR RADIOLOGIST EVAL & MGMT  02/02/2017  . JOINT REPLACEMENT    . KNEE ARTHROSCOPY Right   . MALONEY DILATION N/A 01/20/2017   Procedure: Venia Minks DILATION;  Surgeon: Daneil Dolin, MD;  Location: [facility];  Service: Endoscopy;  Laterality: N/A;  . POLYPECTOMY  12/22/2017   Procedure: POLYPECTOMY;  Surgeon: Daneil Dolin, MD;  Location: [facility];  Service: Endoscopy;;  . REVERSE  SHOULDER ARTHROPLASTY Right 05/24/2014   Procedure: RIGHT SHOULDER REVERSE ARTHROPLASTY;  Surgeon: Netta Cedars, MD;  Location: [place];  Service: Orthopedics;  Laterality: Right;  . REVERSE SHOULDER ARTHROPLASTY Left 06/10/2017   Procedure: LEFT REVERSE SHOULDER ARTHROPLASTY;  Surgeon: Netta Cedars, MD;  Location: [place];  Service: Orthopedics;  Laterality: Left;  . TOTAL KNEE ARTHROPLASTY  01/24/2012   Procedure: TOTAL KNEE ARTHROPLASTY;  Surgeon: Gearlean Alf, MD;  Location: [facility];  Service: Orthopedics;  Laterality: Right;  . TOTAL KNEE ARTHROPLASTY Left 10/15/2019   Procedure: TOTAL KNEE ARTHROPLASTY;  Surgeon: Gaynelle Arabian, MD;  Location: [facility];  Service: Orthopedics;  Laterality: Left;  46min  . TUBAL LIGATION       OB History    Gravida  3   Para  3   Term  2   Preterm  1   AB      Living  3     SAB      IAB      Ectopic      Multiple      Live Births              Family History  Problem  Relation Age of Onset  . Breast cancer Sister   . Colon cancer Sister        two deceased, one living and terminal, one current undergoing treatment, ages 58, 87, 20, 47  . Hypertension Mother   . Stroke Mother   . Heart attack Mother   . Hypertension Father   . Prostate cancer Father     Social History   Tobacco Use  . Smoking status: Former Smoker    Packs/day: 2.00    Years: 40.00    Pack years: 80.00    Types: Cigarettes    Quit date: 01/19/1992    Years since quitting: 28.1  . Smokeless tobacco: Never Used  Vaping Use  . Vaping Use: Never used  Substance Use Topics  . Alcohol use: No  . Drug use: No    Home Medications Prior to Admission medications   Medication Sig Start Date End Date Taking? Authorizing Provider  albuterol (PROAIR HFA) 108 (90 Base) MCG/ACT inhaler Inhale 2 puffs into the lungs every 6 (six) hours as needed for wheezing or shortness of breath (For COPD).     [provider]  ALPRAZolam Duanne Moron) 0.5 MG tablet Take 0.5  tablets (0.25 mg total) by mouth 2 (two) times daily as needed for anxiety. 10/25/19   Gerlene Fee, NP  Benralizumab (FASENRA) 30 MG/ML SOSY Inject 30 mg into the skin. Once A Day Every 56 Days 10/17/19   [provider]  benzonatate (TESSALON) 100 MG capsule Take 1 capsule (100 mg total) by mouth every 8 (eight) hours. 02/29/20   Fredia Sorrow, MD  denosumab (PROLIA) 60 MG/ML SOSY injection Inject 60 mg into the skin every 6 (six) months. 03/01/18   [provider]  docusate sodium (COLACE) 100 MG capsule Take 100 mg by mouth every other day. In the afternoon    [provider]  DULERA 200-5 MCG/ACT AERO Inhale 2 puffs into the lungs 2 (two) times daily. 10/25/19   Gerlene Fee, NP  esomeprazole (NEXIUM) 40 MG capsule Take 1 capsule (40 mg total) by mouth 2 (two) times daily. 10/25/19   Gerlene Fee, NP  ferrous sulfate 325 (65 FE) MG tablet Take 1 tablet (325 mg total) by mouth daily with breakfast. 10/25/19   Gerlene Fee, NP  fexofenadine (ALLEGRA) 180 MG tablet Take 180 mg by mouth daily.    [provider]  fluticasone (FLONASE) 50 MCG/ACT nasal spray Place 1 spray into both nostrils daily as needed for allergies or rhinitis.  10/17/19   [provider]  ketotifen (ZADITOR) 0.025 % ophthalmic solution Place 1 drop into both eyes 2 (two) times daily as needed (allergies). 10/25/19   Gerlene Fee, NP  linaclotide [facility]) 290 MCG CAPS capsule TAKE 1 CAPSULE BY MOUTH DAILY BEFORE BREAKFAST 10/25/19   Gerlene Fee, NP  Menthol, Topical Analgesic, (BIOFREEZE) 4 % GEL Apply 1 application topically 2 (two) times daily. Special Instructions: for back pain 10/23/19   [provider]  montelukast (SINGULAIR) 10 MG tablet Take 1 tablet (10 mg total) by mouth at bedtime. 10/25/19   Gerlene Fee, NP  naloxegol oxalate (MOVANTIK) 25 MG TABS tablet Take 1 tablet (25 mg total) by mouth daily. 02/11/20   Erenest Rasher, PA-C   olmesartan (BENICAR) 40 MG tablet Take 1 tablet (40 mg total) by mouth daily. 10/25/19   Gerlene Fee, NP  ondansetron (ZOFRAN) 4 MG tablet Take 1 tablet (4 mg total) by  mouth every 8 (eight) hours as needed for nausea or vomiting. 01/14/20   Erenest Rasher, PA-C  Oxycodone HCl 10 MG TABS Take 10 mg by mouth every 6 (six) hours. 12/29/19   [provider]  Polyethyl Glycol-Propyl Glycol (SYSTANE OP) Place 1 drop into both eyes 3 (three) times daily as needed (dry/irritated eyes.).     [provider]  polyethylene glycol (MIRALAX / GLYCOLAX) packet Take 17 g by mouth daily as needed (constipation.).     [provider]  potassium chloride (KLOR-CON) 10 MEQ tablet Take 1 tablet (10 mEq total) by mouth every other day. IN THE MORNING 10/25/19   Gerlene Fee, NP  torsemide (DEMADEX) 10 MG tablet Take 1 tablet (10 mg total) by mouth every other day. IN THE MORNING 10/25/19   Gerlene Fee, NP    Allergies    Flexeril [cyclobenzaprine], Other, Keflex [cephalexin], Aspirin, Statins, Adhesive [tape], and Chlorhexidine  Review of Systems   Review of Systems  Constitutional: Negative for chills and fever.  HENT: Negative for rhinorrhea and sore throat.   Eyes: Negative for visual disturbance.  Respiratory: Positive for cough, shortness of breath and wheezing.   Cardiovascular: Negative for chest pain and leg swelling.  Gastrointestinal: Negative for abdominal pain, diarrhea, nausea and vomiting.  Genitourinary: Negative for dysuria.  Musculoskeletal: Negative for back pain and neck pain.  Skin: Negative for rash.  Neurological: Negative for dizziness, light-headedness and headaches.  Hematological: Does not bruise/bleed easily.  Psychiatric/Behavioral: Negative for confusion.    Physical Exam Updated Vital Signs BP (!) 113/92   Pulse 93   Temp 98.2 F (36.8 C) (Oral)   Resp (!) 25   Ht 1.524 m (5')   Wt 77.1 kg   SpO2 99%   BMI 33.20 kg/m    Physical Exam Vitals and nursing note reviewed.  Constitutional:      General: She is not in acute distress.    Appearance: Normal appearance. She is well-developed and well-nourished.  HENT:     Head: Normocephalic and atraumatic.  Eyes:     Extraocular Movements: Extraocular movements intact.     Conjunctiva/sclera: Conjunctivae normal.     Pupils: Pupils are equal, round, and reactive to light.  Cardiovascular:     Rate and Rhythm: Normal rate and regular rhythm.     Heart sounds: No murmur heard.   Pulmonary:     Effort: Pulmonary effort is normal. No respiratory distress.     Breath sounds: Wheezing present.  Abdominal:     Palpations: Abdomen is soft.     Tenderness: There is no abdominal tenderness.  Musculoskeletal:        General: No edema. Normal range of motion.     Cervical back: Neck supple.  Skin:    General: Skin is warm and dry.  Neurological:     General: No focal deficit present.     Mental Status: She is alert and oriented to person, place, and time.     Cranial Nerves: No cranial nerve deficit.     Sensory: No sensory deficit.     Motor: No weakness.  Psychiatric:        Mood and Affect: Mood and affect normal.     ED Results / Procedures / Treatments   Labs (all labs ordered are listed, but only abnormal results are displayed) Labs Reviewed  RESP PANEL BY RT-PCR (FLU A&B, COVID) [id number]    EKG EKG Interpretation  Date/Time:  Friday February 29 2020 14:18:11 EST Ventricular Rate:  80 PR Interval:    QRS Duration: 109 QT Interval:  379 QTC Calculation: 438 R Axis:   21 Text Interpretation: Sinus rhythm Confirmed by Fredia Sorrow [phone]) on 02/29/2020 2:30:24 PM   Radiology DG Chest Port 1 View  Result Date: 02/29/2020 CLINICAL DATA:  Shortness of breath and cough for several days EXAM: PORTABLE CHEST 1 VIEW COMPARISON:  06/09/2019 FINDINGS: Cardiac shadow is stable. Large hiatal hernia is noted. The lungs are clear. Bilateral  shoulder replacements are noted. Changes of prior vertebral augmentation are seen. IMPRESSION: Hiatal hernia stable in appearance. No acute abnormality noted. Electronically Signed   By: Inez Catalina M.D.   On: 02/29/2020 13:44    Procedures Procedures (including critical care time)  Medications Ordered in ED Medications  ipratropium-albuterol (DUONEB) 0.5-2.5 (3) MG/3ML nebulizer solution 3 mL (3 mLs Nebulization Given 02/29/20 1424)  benzonatate (TESSALON) capsule 100 mg (100 mg Oral Given 02/29/20 1418)    ED Course  I have reviewed the triage vital signs and the nursing notes.  Pertinent labs & imaging results that were available during my care of the patient were reviewed by me and considered in my medical decision making (see chart for details).    MDM Rules/Calculators/A&P                          Patient given DuoNeb here.  Once Covid testing was negative.  Wheezing resolved cough improved also given first dose of Tessalon Perle here.  Patient feeling much better.  And she is okay with going home continuing her normal regiment she can go back to what she was on prior to the recent change by Dr. [name] Crane.  And really pulmonary at [facility] is managing her asthma.  Patient given a prescription for Tessalon.    Final Clinical Impression(s) / ED Diagnoses Final diagnoses:  Moderate persistent asthma with exacerbation    Rx / DC Orders ED Discharge Orders         Ordered    benzonatate (TESSALON) 100 MG capsule  Every 8 hours        02/29/20 1522           Fredia Sorrow, MD 02/29/20 1536

## 2020-03-03 DIAGNOSIS — J45909 Unspecified asthma, uncomplicated: Secondary | ICD-10-CM | POA: Diagnosis not present

## 2020-03-03 DIAGNOSIS — Z9181 History of falling: Secondary | ICD-10-CM | POA: Diagnosis not present

## 2020-03-04 ENCOUNTER — Other Ambulatory Visit: Payer: Self-pay

## 2020-03-04 ENCOUNTER — Encounter (HOSPITAL_COMMUNITY)
Admission: RE | Admit: 2020-03-04 | Discharge: 2020-03-04 | Disposition: A | Discharge status: Home / Self Care | Payer: PPO | Source: Ambulatory Visit | Attending: Internal Medicine | Admitting: Internal Medicine

## 2020-03-04 ENCOUNTER — Encounter (HOSPITAL_COMMUNITY): Payer: Self-pay

## 2020-03-04 DIAGNOSIS — M810 Age-related osteoporosis without current pathological fracture: Secondary | ICD-10-CM | POA: Insufficient documentation

## 2020-03-04 MED ORDER — DENOSUMAB 60 MG/ML ~~LOC~~ SOSY
60.0000 mg | PREFILLED_SYRINGE | Freq: Once | SUBCUTANEOUS | Status: AC
  Administered 2020-03-04: 10:00:00 60 mg via SUBCUTANEOUS

## 2020-03-12 DIAGNOSIS — M47896 Other spondylosis, lumbar region: Secondary | ICD-10-CM | POA: Diagnosis not present

## 2020-03-12 DIAGNOSIS — Z79891 Long term (current) use of opiate analgesic: Secondary | ICD-10-CM | POA: Diagnosis not present

## 2020-03-12 DIAGNOSIS — G894 Chronic pain syndrome: Secondary | ICD-10-CM | POA: Diagnosis not present

## 2020-03-17 NOTE — Progress Notes (Signed)
Referring Provider: Benita Stabile, MD Primary Care Physician:  Benita Stabile, MD Primary GI Physician: Dr. Jena Gauss  Chief Complaint  Patient presents with  . Constipation    Doing well.  . Gastroesophageal Reflux    Doing fine    HPI:   Glenda Terry is a 80 y.o. female with history of GERD, dysphagia s/p EGD with normal-appearing esophagus with empiric dilation and medium size hiatal hernia in 2018, constipation likely influenced by chronic opioid use, adenomatous colon polyps with last colonoscopy in 2019 revealing diverticula in the sigmoid and descending colon and 2 tubular adenomas.  No recommendations to repeat.  Also with acute, uncomplicated sigmoid diverticulitis in August 2021. She is presenting today for 67-month follow-up of constipation, nausea/vomiting, and early satiety.  She was last seen in our office 01/14/2020.  She was taking Linzess 290 mcg daily, MiraLAX daily, and 3 Dulcolax stool softeners daily with BMs every other day but stools remained hard with incomplete bowel movements.  Associated lower abdominal pain that improves after a bowel movement.  Previously failed Amitiza.  Noted recent increase in oxycodone from 5-10 mg.  Also reported new onset daily nausea with vomiting maybe once a month which began after knee replacement in August 2021.  Nausea sometimes induced by taking medications on empty stomach but may also be triggered by eating.  GERD well controlled on Nexium twice daily. Ongoing early satiety. Reported trying to lose weight.  Suspected worsening constipation and pain medications are likely influencing nausea.  Could also have gastroparesis.  Less likely biliary etiology.  Plan to stop Linzess and start Movantik 12.5 mg daily, continue MiraLAX daily, GES, Zofran as needed, continue Nexium twice daily, and requested progress report in 2 weeks.  Also discussed repeat colonoscopy due to recent episode of diverticulitis, but patient declined. Follow-up in  office in 2 months.  GES 02/05/2020: Normal.  Patient stated her nausea was improving daily.  Patient called on 02/06/2020 with ongoing constipation.  Recommended increasing Movantik to 25 mg daily.  Today:  Constipation: Much improved with Movantik 25 mg daily.  Typically with a couple of soft or mushy BMs in the morning.  No longer having to take MiraLAX or stool softeners. Occasional lower abdominal cramping prior to BM that improves thereafter.  This is much improved with better management of constipation.  No BRBPR or melena. Overall, she is pleased with her current bowel regimen.  She is asking for a: Cleanse.  States she wants to start the new year cleaned out as she is also going to start back eating healthier as well.   GERD is well controlled on Nexium 40 mg twice daily. N/V has resolved.   Weight Loss: Resolved.  She is actually gained 11 pounds over the last 2 months.  States she needs to get back to eating healthy and working on weight loss.  States around 7 PM, she wants to eat everything in the house.  Thinks this is secondary to boredom.  In this new year, she plans start working on eating healthier and desires to start walking more.  Nervous about getting out in group due to Covid.  Currently dealing with asthma flare.  No significant shortness of breath.  Coughing quite a bit.  She follows closely with [facility].  She recently received a Kenalog shot which is helping somewhat.  Most recent hemoglobin 11.2 on 01/21/2020.  Creatinine 1.3.  Iron panel within normal limits.  Past Medical History:  Diagnosis  Date  . Anxiety   . Asthma   . Back pain, chronic   . CKD (chronic kidney disease) stage 3, GFR 30-59 ml/min (HCC)   . Compression fx, thoracic spine (HCC)    T - 11  . COPD (chronic obstructive pulmonary disease) ([place]) 12/16/2015  . 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 Essential hypertension   . GERD (gastroesophageal reflux disease)   . HOH (hard of hearing)   .  Inflammatory arthritis   . Iron deficiency anemia 01/2012  . Pulmonary eosinophilia ([place])   . Spinal stenosis   . Tubular adenoma 11/2012    Past Surgical History:  Procedure Laterality Date  . ABDOMINAL HYSTERECTOMY    . BACK SURGERY    . CARPAL TUNNEL RELEASE Right 4/14  . CATARACT EXTRACTION W/PHACO Left 11/19/2014   Procedure: CATARACT EXTRACTION PHACO AND INTRAOCULAR LENS PLACEMENT (I[facility]);  Surgeon: Williams Che, MD;  Location: [facility];  Service: Ophthalmology;  Laterality: Left;  CDE: 4.77  . CATARACT EXTRACTION W/PHACO Right 02/03/2015   Procedure: CATARACT EXTRACTION PHACO AND INTRAOCULAR LENS PLACEMENT ([place]);  Surgeon: Williams Che, MD;  Location: [facility];  Service: Ophthalmology;  Laterality: Right;  CDE: 4.54  . COLONOSCOPY  06/19/2008   RMR: tortuous and elongated colon with scattered left-sided diverticula/colonic mucosa appeared entirely normal. Prior colonic ulcers had resolved.  . COLONOSCOPY  12/2007   Dr. Gala Romney: Scattered diffuse sigmoid diverticula, 2 areas of ulceration at the hepatic flexure. Biopsies unremarkable.  . COLONOSCOPY N/A 11/20/2012   next TCS 11/2017  . COLONOSCOPY WITH PROPOFOL N/A 12/22/2017   Procedure: COLONOSCOPY WITH PROPOFOL;  Surgeon: Daneil Dolin, MD; diverticula in the sigmoid and descending colon and 2 tubular adenomas.  No recommendations to repeat.    . DECOMPRESSIVE LUMBAR LAMINECTOMY LEVEL 2  04/20/2012   Procedure: DECOMPRESSIVE LUMBAR LAMINECTOMY LEVEL 2;  Surgeon: Tobi Bastos, MD;  Location: [facility];  Service: Orthopedics;  Laterality: Right;  Decompressive Lumbar Laminectomy of the L4 - L5 and L5 - S1 Complete/Laminectomy L5 on the Right (X-Ray)  . ESOPHAGOGASTRODUODENOSCOPY  12/2007   Dr. 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 Small hiatal hernia. Slightly pale duodenal mucosa (biopsy unremarkable)  . ESOPHAGOGASTRODUODENOSCOPY (EGD) WITH PROPOFOL N/A 01/20/2017   Dr. Gala Romney: Medium  sized hiatal hernia empiric esophageal dilation for history of dysphagia  . EYE SURGERY     BIL CATARACTS  . IR KYPHO THORACIC WITH BONE BIOPSY  02/08/2017  . IR RADIOLOGIST EVAL & MGMT  02/02/2017  . JOINT REPLACEMENT    . KNEE ARTHROSCOPY Right   . MALONEY DILATION N/A 01/20/2017   Procedure: Venia Minks DILATION;  Surgeon: Daneil Dolin, MD;  [facility];  Service: Endoscopy;  Laterality: N/A;  . POLYPECTOMY  12/22/2017   Procedure: POLYPECTOMY;  Surgeon: Daneil Dolin, MD;  [facility]: [facility];  Service: Endoscopy;;  . REVERSE SHOULDER ARTHROPLASTY Right 05/24/2014   Procedure: RIGHT SHOULDER REVERSE ARTHROPLASTY;  Surgeon: Netta Cedars, MD;  Location: [facility];  Service: Orthopedics;  Laterality: Right;  . REVERSE SHOULDER ARTHROPLASTY Left 06/10/2017   Procedure: LEFT REVERSE SHOULDER ARTHROPLASTY;  Surgeon: Netta Cedars, MD;  Location: [facility];  Service: Orthopedics;  Laterality: Left;  . TOTAL KNEE ARTHROPLASTY  01/24/2012   Procedure: TOTAL KNEE ARTHROPLASTY;  Surgeon: Gearlean Alf, MD;  Location: [facility];  Service: Orthopedics;  Laterality: Right;  . TOTAL KNEE ARTHROPLASTY Left 10/15/2019   Procedure: TOTAL KNEE ARTHROPLASTY;  Surgeon: Wynelle Link,  Pilar Plate, MD;  Location: [facility];  Service: Orthopedics;  Laterality: Left;  69min  . TUBAL LIGATION      Current Outpatient Medications  Medication Sig Dispense Refill  . albuterol (PROAIR HFA) 108 (90 Base) MCG/ACT inhaler Inhale 2 puffs into the lungs every 6 (six) hours as needed for wheezing or shortness of breath (For COPD).     Marland Kitchen ALPRAZolam (XANAX) 0.5 MG tablet Take 0.5 tablets (0.25 mg total) by mouth 2 (two) times daily as needed for anxiety. 10 tablet 0  . Benralizumab 30 MG/ML SOSY Inject 30 mg into the skin. Once A Day Every 56 Days    . benzonatate (TESSALON) 100 MG capsule Take 1 capsule (100 mg total) by mouth every 8 (eight) hours. 21 capsule 0  . denosumab (PROLIA) 60 MG/ML SOSY injection Inject 60 mg into the skin  every 6 (six) months.    . DULERA 200-5 MCG/ACT AERO Inhale 2 puffs into the lungs 2 (two) times daily. 8.8 g 0  . esomeprazole (NEXIUM) 40 MG capsule Take 1 capsule (40 mg total) by mouth 2 (two) times daily. 60 capsule 0  . ferrous sulfate 325 (65 FE) MG tablet Take 1 tablet (325 mg total) by mouth daily with breakfast. 30 tablet 0  . fexofenadine (ALLEGRA) 180 MG tablet Take 180 mg by mouth daily.    . fluticasone (FLONASE) 50 MCG/ACT nasal spray Place 1 spray into both nostrils daily as needed for allergies or rhinitis.     Marland Kitchen ketotifen (ZADITOR) 0.025 % ophthalmic solution Place 1 drop into both eyes 2 (two) times daily as needed (allergies). 5 mL 0  . Menthol, Topical Analgesic, (BIOFREEZE) 4 % GEL Apply 1 application topically 2 (two) times daily. Special Instructions: for back pain    . montelukast (SINGULAIR) 10 MG tablet Take 1 tablet (10 mg total) by mouth at bedtime. 30 tablet 0  . naloxegol oxalate (MOVANTIK) 25 MG TABS tablet Take 1 tablet (25 mg total) by mouth daily. 30 tablet 3  . olmesartan (BENICAR) 40 MG tablet Take 1 tablet (40 mg total) by mouth daily. 30 tablet 0  . Oxycodone HCl 10 MG TABS Take 10 mg by mouth every 6 (six) hours.    Vladimir Faster Glycol-Propyl Glycol (SYSTANE OP) Place 1 drop into both eyes 3 (three) times daily as needed (dry/irritated eyes.).     Marland Kitchen potassium chloride (KLOR-CON) 10 MEQ tablet Take 1 tablet (10 mEq total) by mouth every other day. IN THE MORNING 15 tablet 0  . torsemide (DEMADEX) 10 MG tablet Take 1 tablet (10 mg total) by mouth every other day. IN THE MORNING 15 tablet 0  . ondansetron (ZOFRAN) 4 MG tablet Take 1 tablet (4 mg total) by mouth every 8 (eight) hours as needed for nausea or vomiting. (Patient not taking: Reported on 03/19/2020) 30 tablet 0   No current facility-administered medications for this visit.    Allergies as of 03/19/2020 - Review Complete 03/04/2020  Allergen Reaction Noted  . Flexeril [cyclobenzaprine] Anaphylaxis  01/12/2012  . Other Anaphylaxis and Other (See Comments) 02/08/2011  . Keflex [cephalexin] Itching 05/26/2012  . Aspirin Other (See Comments) 05/05/2015  . Statins Swelling and Other (See Comments) 05/17/2011  . Adhesive [tape] Rash 11/01/2014  . Chlorhexidine Rash 11/19/2014    Family History  Problem Relation Age of Onset  . Breast cancer Sister   . Colon cancer Sister        two deceased, one living and terminal, one current undergoing  treatment, ages 83, 30, 75, 10  . Hypertension Mother   . Stroke Mother   . Heart attack Mother   . Hypertension Father   . Prostate cancer Father     Social History   Socioeconomic History  . Marital status: Single    Spouse name: Not on file  . Number of children: 3  . Years of education: Not on file  . Highest education level: Not on file  Occupational History  . Not on file  Tobacco Use  . Smoking status: Former Smoker    Packs/day: 2.00    Years: 40.00    Pack years: 80.00    Types: Cigarettes    Quit date: 01/19/1992    Years since quitting: 28.1  . Smokeless tobacco: Never Used  Vaping Use  . Vaping Use: Never used  Substance and Sexual Activity  . Alcohol use: No  . Drug use: No  . Sexual activity: Never  Other Topics Concern  . Not on file  Social History Narrative  . Not on file   Social Determinants of Health   Financial Resource Strain: Not on file  Food Insecurity: Not on file  Transportation Needs: Not on file  Physical Activity: Not on file  Stress: Not on file  Social Connections: Not on file    Review of Systems: Gen: Denies fever, chills, cold or flulike symptoms, presyncope, syncope. CV: Denies chest pain or palpitations. Resp: See HPI. GI: See HPI. Heme: See HPI  Physical Exam: BP 117/67   Pulse 88   Temp (!) 97.1 F (36.2 C)   Ht 5' (1.524 m)   Wt 188 lb 3.2 oz (85.4 kg)   BMI 36.76 kg/m  General:   Alert and oriented. No distress noted. Pleasant and cooperative.  Head:  Normocephalic  and atraumatic. Eyes:  Conjuctiva clear without scleral icterus. Heart:  S1, S2 present without murmurs appreciated. Lungs:  Clear to auscultation bilaterally. No wheezes, rales, or rhonchi. No distress, coughing fairly frequently during visit. Abdomen:  +BS, soft, non-tender and non-distended. No rebound or guarding. No HSM or masses noted. Msk:  Symmetrical without gross deformities. Normal posture. Extremities:  Without edema. Neurologic:  Alert and  oriented x4 Psych: Normal mood and affect.

## 2020-03-19 ENCOUNTER — Other Ambulatory Visit: Payer: Self-pay

## 2020-03-19 ENCOUNTER — Encounter: Payer: Self-pay | Admitting: Gastroenterology

## 2020-03-19 ENCOUNTER — Ambulatory Visit: Payer: PPO | Admitting: Gastroenterology

## 2020-03-19 VITALS — BP 117/67 | HR 88 | Temp 97.1°F | Ht 60.0 in | Wt 188.2 lb

## 2020-03-19 DIAGNOSIS — D509 Iron deficiency anemia, unspecified: Secondary | ICD-10-CM | POA: Diagnosis not present

## 2020-03-19 DIAGNOSIS — K5909 Other constipation: Secondary | ICD-10-CM

## 2020-03-19 DIAGNOSIS — R112 Nausea with vomiting, unspecified: Secondary | ICD-10-CM

## 2020-03-19 DIAGNOSIS — K219 Gastro-esophageal reflux disease without esophagitis: Secondary | ICD-10-CM

## 2020-03-19 NOTE — Patient Instructions (Addendum)
Continue taking Movantik 25 mg daily for constipation.  See instructions below for MiraLAX bowel prep as you requested to clean your colon out.   Continue taking Nexium 40 mg twice daily 30 minutes before breakfast and dinner.  Schedule  Follow a GERD diet:  Avoid fried, fatty, greasy, spicy, citrus foods. Avoid caffeine and carbonated beverages. Avoid chocolate. Try eating 4-6 small meals a day rather than 3 large meals. Do not eat within 3 hours of laying down. Prop head of bed up on wood or bricks to create a 6 inch incline.  We will plan to see you back in 6 months. Call with questions or concerns prior.   Ermalinda Memos, PA-C [facility]   [facility] PREP INSTRUCTION  Purchase:  MIRALAX 238 gram bottle, 1 box of DULCOLAX (All over the counter medications)  CLEAR LIQUIDS day of prep.    At 10:00 AM, take 2 DULCOLAX 5mg  tablets   At 12:00 PM, Mix 5 teaspoons of Miralax in any 4-6 ounces of CLEAR LIQUIDS (Gatorade) every hour for 5 hours until passing clear, watery stools. Be sure to drink 4 ounces of clear liquid 30 minutes after each dose of Miralax.    At 3:00 PM, take 2 Dulcolax 5mg  tablets

## 2020-03-20 ENCOUNTER — Encounter: Payer: Self-pay | Admitting: Gastroenterology

## 2020-03-20 DIAGNOSIS — Z96652 Presence of left artificial knee joint: Secondary | ICD-10-CM | POA: Diagnosis not present

## 2020-03-20 NOTE — Assessment & Plan Note (Signed)
Chronic, stable microcytic anemia in the setting of chronic renal insufficiency, following closely with nephrology.  Most recent hemoglobin 11.2 on 01/21/2020.  Iron panel within normal limits.  Chronically on oral iron.  EGD in 2018 with medium size hiatal hernia.  Colonoscopy 2019 with 2 tubular adenomas, no recommendations to repeat.  Denies overt GI bleeding.  Continue to monitor.

## 2020-03-20 NOTE — Assessment & Plan Note (Addendum)
Much improved with Movantik 25 mg daily.  Currently with a couple of BMs every morning.  No alarm symptoms.  Colonoscopy in 2019 with 2 tubular adenomas, no recommendations to repeat due to age.  Patient is pleased with current bowel regimen.  Glenda Terry is asking for colon cleanse as Glenda Terry wants to start the new year "cleaned out" as Glenda Terry is also going to get back to eating healthier. Advise Glenda Terry continue Movantik 25 mg daily.  Also provided her with 1/2 MiraLAX bowel prep instructions for colon cleanse.  Plan to follow-up in 6 months.

## 2020-03-20 NOTE — Assessment & Plan Note (Addendum)
Resolved with better management of constipation as per above.  Some question of gastroparesis previously due to reported early satiety and weight loss.  Underwent GES 02/05/2020 which was normal. Notably, patient has gained 11 pounds in the last 2 months.  States she is planning to start eating healthier and losing weight again.  Monitor for return of N/V. Follow-up in 6 months.

## 2020-03-20 NOTE — Assessment & Plan Note (Signed)
Well-controlled on Nexium 40 mg twice daily.  Advised to continue her current medications.  Follow-up in 6 months.

## 2020-03-24 DIAGNOSIS — E249 Cushing's syndrome, unspecified: Secondary | ICD-10-CM | POA: Diagnosis not present

## 2020-03-24 DIAGNOSIS — R531 Weakness: Secondary | ICD-10-CM | POA: Diagnosis not present

## 2020-03-24 DIAGNOSIS — R944 Abnormal results of kidney function studies: Secondary | ICD-10-CM | POA: Diagnosis not present

## 2020-03-24 DIAGNOSIS — R296 Repeated falls: Secondary | ICD-10-CM | POA: Diagnosis not present

## 2020-03-24 DIAGNOSIS — G8929 Other chronic pain: Secondary | ICD-10-CM | POA: Diagnosis not present

## 2020-03-24 DIAGNOSIS — R062 Wheezing: Secondary | ICD-10-CM | POA: Diagnosis not present

## 2020-03-24 DIAGNOSIS — J45998 Other asthma: Secondary | ICD-10-CM | POA: Diagnosis not present

## 2020-03-24 DIAGNOSIS — J4551 Severe persistent asthma with (acute) exacerbation: Secondary | ICD-10-CM | POA: Diagnosis not present

## 2020-03-24 DIAGNOSIS — K219 Gastro-esophageal reflux disease without esophagitis: Secondary | ICD-10-CM | POA: Diagnosis not present

## 2020-03-24 DIAGNOSIS — R600 Localized edema: Secondary | ICD-10-CM | POA: Diagnosis not present

## 2020-03-24 DIAGNOSIS — R7989 Other specified abnormal findings of blood chemistry: Secondary | ICD-10-CM | POA: Diagnosis not present

## 2020-03-24 DIAGNOSIS — M4800 Spinal stenosis, site unspecified: Secondary | ICD-10-CM | POA: Diagnosis not present

## 2020-03-25 DIAGNOSIS — Z8719 Personal history of other diseases of the digestive system: Secondary | ICD-10-CM | POA: Diagnosis not present

## 2020-03-25 DIAGNOSIS — Z09 Encounter for follow-up examination after completed treatment for conditions other than malignant neoplasm: Secondary | ICD-10-CM | POA: Diagnosis not present

## 2020-03-27 DIAGNOSIS — M8580 Other specified disorders of bone density and structure, unspecified site: Secondary | ICD-10-CM | POA: Diagnosis not present

## 2020-03-27 DIAGNOSIS — K219 Gastro-esophageal reflux disease without esophagitis: Secondary | ICD-10-CM | POA: Diagnosis not present

## 2020-03-27 DIAGNOSIS — M797 Fibromyalgia: Secondary | ICD-10-CM | POA: Diagnosis not present

## 2020-03-27 DIAGNOSIS — E875 Hyperkalemia: Secondary | ICD-10-CM | POA: Diagnosis not present

## 2020-03-27 DIAGNOSIS — J45909 Unspecified asthma, uncomplicated: Secondary | ICD-10-CM | POA: Diagnosis not present

## 2020-03-27 DIAGNOSIS — M1990 Unspecified osteoarthritis, unspecified site: Secondary | ICD-10-CM | POA: Diagnosis not present

## 2020-03-27 DIAGNOSIS — F411 Generalized anxiety disorder: Secondary | ICD-10-CM | POA: Diagnosis not present

## 2020-03-27 DIAGNOSIS — E1122 Type 2 diabetes mellitus with diabetic chronic kidney disease: Secondary | ICD-10-CM | POA: Diagnosis not present

## 2020-03-27 DIAGNOSIS — Z712 Person consulting for explanation of examination or test findings: Secondary | ICD-10-CM | POA: Diagnosis not present

## 2020-03-27 DIAGNOSIS — D509 Iron deficiency anemia, unspecified: Secondary | ICD-10-CM | POA: Diagnosis not present

## 2020-03-27 DIAGNOSIS — I10 Essential (primary) hypertension: Secondary | ICD-10-CM | POA: Diagnosis not present

## 2020-03-28 DIAGNOSIS — Z87891 Personal history of nicotine dependence: Secondary | ICD-10-CM | POA: Diagnosis not present

## 2020-03-28 DIAGNOSIS — D7219 Other eosinophilia: Secondary | ICD-10-CM | POA: Diagnosis not present

## 2020-03-28 DIAGNOSIS — J4550 Severe persistent asthma, uncomplicated: Secondary | ICD-10-CM | POA: Diagnosis not present

## 2020-04-08 ENCOUNTER — Emergency Department (HOSPITAL_COMMUNITY): Payer: PPO | Admitting: Certified Registered"

## 2020-04-08 ENCOUNTER — Encounter (HOSPITAL_COMMUNITY)
Admission: EM | Disposition: A | Discharge status: Home / Self Care | Payer: Self-pay | Source: Home / Self Care | Attending: Emergency Medicine

## 2020-04-08 ENCOUNTER — Encounter (HOSPITAL_COMMUNITY): Payer: Self-pay | Admitting: Emergency Medicine

## 2020-04-08 ENCOUNTER — Ambulatory Visit (HOSPITAL_COMMUNITY)
Admission: EM | Admit: 2020-04-08 | Discharge: 2020-04-08 | Disposition: A | Discharge status: Home / Self Care | Payer: PPO | Attending: Emergency Medicine | Admitting: Emergency Medicine

## 2020-04-08 ENCOUNTER — Other Ambulatory Visit: Payer: Self-pay

## 2020-04-08 ENCOUNTER — Emergency Department (HOSPITAL_COMMUNITY): Payer: PPO

## 2020-04-08 DIAGNOSIS — N183 Chronic kidney disease, stage 3 unspecified: Secondary | ICD-10-CM | POA: Insufficient documentation

## 2020-04-08 DIAGNOSIS — Z881 Allergy status to other antibiotic agents status: Secondary | ICD-10-CM | POA: Insufficient documentation

## 2020-04-08 DIAGNOSIS — K449 Diaphragmatic hernia without obstruction or gangrene: Secondary | ICD-10-CM | POA: Insufficient documentation

## 2020-04-08 DIAGNOSIS — Z20822 Contact with and (suspected) exposure to covid-19: Secondary | ICD-10-CM | POA: Insufficient documentation

## 2020-04-08 DIAGNOSIS — Z87891 Personal history of nicotine dependence: Secondary | ICD-10-CM | POA: Diagnosis not present

## 2020-04-08 DIAGNOSIS — Z96652 Presence of left artificial knee joint: Secondary | ICD-10-CM | POA: Diagnosis not present

## 2020-04-08 DIAGNOSIS — J449 Chronic obstructive pulmonary disease, unspecified: Secondary | ICD-10-CM | POA: Insufficient documentation

## 2020-04-08 DIAGNOSIS — Z886 Allergy status to analgesic agent status: Secondary | ICD-10-CM | POA: Insufficient documentation

## 2020-04-08 DIAGNOSIS — T182XXA Foreign body in stomach, initial encounter: Secondary | ICD-10-CM | POA: Insufficient documentation

## 2020-04-08 DIAGNOSIS — Z888 Allergy status to other drugs, medicaments and biological substances status: Secondary | ICD-10-CM | POA: Diagnosis not present

## 2020-04-08 DIAGNOSIS — S22089A Unspecified fracture of T11-T12 vertebra, initial encounter for closed fracture: Secondary | ICD-10-CM | POA: Diagnosis not present

## 2020-04-08 DIAGNOSIS — Z7951 Long term (current) use of inhaled steroids: Secondary | ICD-10-CM | POA: Insufficient documentation

## 2020-04-08 DIAGNOSIS — X58XXXA Exposure to other specified factors, initial encounter: Secondary | ICD-10-CM | POA: Diagnosis not present

## 2020-04-08 DIAGNOSIS — K5730 Diverticulosis of large intestine without perforation or abscess without bleeding: Secondary | ICD-10-CM | POA: Diagnosis not present

## 2020-04-08 DIAGNOSIS — Z79891 Long term (current) use of opiate analgesic: Secondary | ICD-10-CM | POA: Diagnosis not present

## 2020-04-08 DIAGNOSIS — Z79899 Other long term (current) drug therapy: Secondary | ICD-10-CM | POA: Diagnosis not present

## 2020-04-08 DIAGNOSIS — R1010 Upper abdominal pain, unspecified: Secondary | ICD-10-CM | POA: Diagnosis present

## 2020-04-08 DIAGNOSIS — K219 Gastro-esophageal reflux disease without esophagitis: Secondary | ICD-10-CM | POA: Diagnosis not present

## 2020-04-08 DIAGNOSIS — J45909 Unspecified asthma, uncomplicated: Secondary | ICD-10-CM | POA: Insufficient documentation

## 2020-04-08 DIAGNOSIS — K429 Umbilical hernia without obstruction or gangrene: Secondary | ICD-10-CM | POA: Insufficient documentation

## 2020-04-08 DIAGNOSIS — I129 Hypertensive chronic kidney disease with stage 1 through stage 4 chronic kidney disease, or unspecified chronic kidney disease: Secondary | ICD-10-CM | POA: Diagnosis not present

## 2020-04-08 DIAGNOSIS — Z96612 Presence of left artificial shoulder joint: Secondary | ICD-10-CM | POA: Insufficient documentation

## 2020-04-08 HISTORY — PX: ESOPHAGOGASTRODUODENOSCOPY (EGD) WITH PROPOFOL: SHX5813

## 2020-04-08 HISTORY — PX: FOREIGN BODY REMOVAL: SHX962

## 2020-04-08 LAB — COMPREHENSIVE METABOLIC PANEL
ALT: 28 U/L (ref 0–44)
AST: 26 U/L (ref 15–41)
Albumin: 3.5 g/dL (ref 3.5–5.0)
Alkaline Phosphatase: 46 U/L (ref 38–126)
Anion gap: 9 (ref 5–15)
BUN: 28 mg/dL — ABNORMAL HIGH (ref 8–23)
CO2: 25 mmol/L (ref 22–32)
Calcium: 8.6 mg/dL — ABNORMAL LOW (ref 8.9–10.3)
Chloride: 100 mmol/L (ref 98–111)
Creatinine, Ser: 1.82 mg/dL — ABNORMAL HIGH (ref 0.44–1.00)
GFR, Estimated: 28 mL/min — ABNORMAL LOW (ref >60)
Glucose, Bld: 114 mg/dL — ABNORMAL HIGH (ref 70–99)
Potassium: 4.3 mmol/L (ref 3.5–5.1)
Sodium: 134 mmol/L — ABNORMAL LOW (ref 135–145)
Total Bilirubin: 0.7 mg/dL (ref 0.3–1.2)
Total Protein: 6.5 g/dL (ref 6.5–8.1)

## 2020-04-08 LAB — URINALYSIS, ROUTINE W REFLEX MICROSCOPIC
Bilirubin Urine: NEGATIVE
Glucose, UA: NEGATIVE mg/dL
Hgb urine dipstick: NEGATIVE
Ketones, ur: NEGATIVE mg/dL
Leukocytes,Ua: NEGATIVE
Nitrite: NEGATIVE
Protein, ur: NEGATIVE mg/dL
Specific Gravity, Urine: 1.005 (ref 1.005–1.030)
pH: 7 (ref 5.0–8.0)

## 2020-04-08 LAB — CBC WITH DIFFERENTIAL/PLATELET
Abs Immature Granulocytes: 0.04 10*3/uL (ref 0.00–0.07)
Basophils Absolute: 0 10*3/uL (ref 0.0–0.1)
Basophils Relative: 0 %
Eosinophils Absolute: 0 10*3/uL (ref 0.0–0.5)
Eosinophils Relative: 0 %
HCT: 32.5 % — ABNORMAL LOW (ref 36.0–46.0)
Hemoglobin: 10 g/dL — ABNORMAL LOW (ref 12.0–15.0)
Immature Granulocytes: 0 %
Lymphocytes Relative: 28 %
Lymphs Abs: 2.6 10*3/uL (ref 0.7–4.0)
MCH: 25.6 pg — ABNORMAL LOW (ref 26.0–34.0)
MCHC: 30.8 g/dL (ref 30.0–36.0)
MCV: 83.3 fL (ref 80.0–100.0)
Monocytes Absolute: 0.7 10*3/uL (ref 0.1–1.0)
Monocytes Relative: 7 %
Neutro Abs: 6 10*3/uL (ref 1.7–7.7)
Neutrophils Relative %: 65 %
Platelets: 262 10*3/uL (ref 150–400)
RBC: 3.9 MIL/uL (ref 3.87–5.11)
RDW: 17.1 % — ABNORMAL HIGH (ref 11.5–15.5)
WBC: 9.3 10*3/uL (ref 4.0–10.5)
nRBC: 0.2 % (ref 0.0–0.2)

## 2020-04-08 LAB — SARS CORONAVIRUS 2 BY RT PCR (HOSPITAL ORDER, PERFORMED IN ~~LOC~~ HOSPITAL LAB): SARS Coronavirus 2: NEGATIVE

## 2020-04-08 LAB — LACTIC ACID, PLASMA: Lactic Acid, Venous: 1.2 mmol/L (ref 0.5–1.9)

## 2020-04-08 LAB — LIPASE, BLOOD: Lipase: 20 U/L (ref 11–51)

## 2020-04-08 SURGERY — ESOPHAGOGASTRODUODENOSCOPY (EGD) WITH PROPOFOL
Anesthesia: General

## 2020-04-08 SURGERY — ESOPHAGOGASTRODUODENOSCOPY (EGD) WITH PROPOFOL
Anesthesia: Monitor Anesthesia Care

## 2020-04-08 MED ORDER — SODIUM CHLORIDE 0.9 % IV BOLUS
500.0000 mL | Freq: Once | INTRAVENOUS | Status: AC
  Administered 2020-04-08: 500 mL via INTRAVENOUS

## 2020-04-08 MED ORDER — AEROCHAMBER Z-STAT PLUS/MEDIUM MISC
1.0000 | Freq: Once | Status: AC
  Administered 2020-04-08: 1
  Filled 2020-04-08: qty 1

## 2020-04-08 MED ORDER — PROPOFOL 10 MG/ML IV BOLUS
INTRAVENOUS | Status: DC | PRN
  Administered 2020-04-08: 130 mg via INTRAVENOUS

## 2020-04-08 MED ORDER — SUCCINYLCHOLINE CHLORIDE 200 MG/10ML IV SOSY
PREFILLED_SYRINGE | INTRAVENOUS | Status: DC | PRN
  Administered 2020-04-08: 40 mg via INTRAVENOUS
  Administered 2020-04-08: 120 mg via INTRAVENOUS

## 2020-04-08 MED ORDER — FENTANYL CITRATE (PF) 100 MCG/2ML IJ SOLN
50.0000 ug | Freq: Once | INTRAMUSCULAR | Status: AC
  Administered 2020-04-08: 50 ug via INTRAVENOUS
  Filled 2020-04-08: qty 2

## 2020-04-08 MED ORDER — ALBUTEROL SULFATE HFA 108 (90 BASE) MCG/ACT IN AERS
8.0000 | INHALATION_SPRAY | Freq: Once | RESPIRATORY_TRACT | Status: AC
  Administered 2020-04-08: 8 via RESPIRATORY_TRACT
  Filled 2020-04-08: qty 6.7

## 2020-04-08 MED ORDER — LIDOCAINE 2% (20 MG/ML) 5 ML SYRINGE
INTRAMUSCULAR | Status: DC | PRN
  Administered 2020-04-08: 100 mg via INTRAVENOUS

## 2020-04-08 MED ORDER — MIDAZOLAM HCL 2 MG/2ML IJ SOLN
1.0000 mg | INTRAMUSCULAR | Status: DC | PRN
  Administered 2020-04-08: 1 mg via INTRAVENOUS

## 2020-04-08 MED ORDER — SODIUM CHLORIDE 0.9 % IV SOLN: INTRAVENOUS | Status: DC

## 2020-04-08 MED ORDER — ONDANSETRON HCL 4 MG/2ML IJ SOLN
4.0000 mg | Freq: Once | INTRAMUSCULAR | Status: AC
  Administered 2020-04-08: 4 mg via INTRAVENOUS
  Filled 2020-04-08: qty 2

## 2020-04-08 MED ORDER — MIDAZOLAM HCL 2 MG/2ML IJ SOLN
INTRAMUSCULAR | Status: AC
  Filled 2020-04-08: qty 2

## 2020-04-08 MED ORDER — LACTATED RINGERS IV SOLN: INTRAVENOUS | Status: DC

## 2020-04-08 MED ORDER — ONDANSETRON HCL 4 MG/2ML IJ SOLN
INTRAMUSCULAR | Status: DC | PRN
  Administered 2020-04-08: 4 mg via INTRAVENOUS

## 2020-04-08 NOTE — ED Provider Notes (Signed)
[facility] EMERGENCY DEPARTMENT Provider Note   CSN: [id number] Arrival date & time: 04/08/20  0229   Time seen 3:21 AM  History Chief Complaint  Patient presents with  . Abdominal Pain    Glenda Terry is a 80 y.o. female.  HPI   Patient states she was fine all day although she did have some swelling of her ankles and she took a water pill prior to going to bed.  She states she woke up at 1130 to use the bathroom and she noted she was having upper abdominal pain that radiates into her lower abdomen in the midline.  She describes the pain is stabbing.  She denies nausea, vomiting, diarrhea, or fever.  She denies feeling abdominal bloating.  Nothing she does makes the pain worse, nothing she does makes it feel better.  She states she is never had this pain before.  She states for dinner tonight she ate greens, beans, and fruit.  She states she did start a new cholesterol pill on January 20.  She is status post hysterectomy and BTL as far as abdominal surgeries.  Patient keeps stating "I think it is my gallbladder".  Patient states she has stage III kidney disease.  She also states "I will NEVER go on dialysis".   PCP Celene Squibb, MD   Past Medical History:  Diagnosis Date  . Anxiety   . Asthma   . Back pain, chronic   . CKD (chronic kidney disease) stage 3, GFR 30-59 ml/min (HCC)   . Compression fx, thoracic spine (HCC)    T - 11  . COPD (chronic obstructive pulmonary disease) ([place]) 12/16/2015  . 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 Essential hypertension   . GERD (gastroesophageal reflux disease)   . HOH (hard of hearing)   . Inflammatory arthritis   . Iron deficiency anemia 01/2012  . Pulmonary eosinophilia ([place])   . Spinal stenosis   . Tubular adenoma 11/2012    Patient Active Problem List   Diagnosis Date Noted  . Nausea with vomiting 01/14/2020  . Chronic bilateral low back pain 10/25/2019  . Chronic constipation 10/22/2019  . Bilateral lower extremity edema  10/22/2019  . Chronic anemia 10/22/2019  . 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 Tachycardia 10/18/2019  . Primary osteoarthritis of knee 10/16/2019  . Primary osteoarthritis of left knee 10/15/2019  . Left lower quadrant abdominal pain 02/16/2019  . Hx of adenomatous colonic polyps 11/28/2017  . 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 Eosinophil count raised 06/09/2016  . Severe persistent asthma 06/07/2016  . COPD (chronic obstructive pulmonary disease) ([place]) 12/16/2015  . Asthma exacerbation 09/20/2015  . Acute respiratory failure ([place]) 09/20/2015  . CKD (chronic kidney disease) stage 3, GFR 30-59 ml/min (HCC) 09/20/2015  . Hemorrhoids, internal 08/21/2015  . 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 Microcytic anemia 11/01/2012  . Family hx of colon cancer 11/01/2012  . 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 Herniated lumbar intervertebral disc 04/20/2012  . Difficulty in walking(719.7) 03/13/2012  . Stiffness of joint, not elsewhere classified, lower leg 03/13/2012  . Weakness of right leg 03/13/2012  . OA (osteoarthritis) of knee 01/24/2012  . Anxiety 02/09/2011  . HTN (hypertension) 02/09/2011  . Arthritis 02/09/2011  . GERD (gastroesophageal reflux disease) 02/09/2011  . Rheumatoid arthritis ([place]) 02/09/2011    Past Surgical History:  Procedure Laterality Date  . ABDOMINAL HYSTERECTOMY    . BACK SURGERY    . CARPAL TUNNEL RELEASE Right  4/14  . CATARACT EXTRACTION W/PHACO Left 11/19/2014   Procedure: CATARACT EXTRACTION PHACO AND INTRAOCULAR LENS PLACEMENT (IOC);  Surgeon: Williams Che, MD;  Location: [facility];  Service: Ophthalmology;  Laterality: Left;  CDE: 4.77  . CATARACT EXTRACTION W/PHACO Right 02/03/2015   Procedure: CATARACT EXTRACTION PHACO AND INTRAOCULAR LENS PLACEMENT ([place]);  Surgeon: Williams Che, MD;  Location: [facility];  Service: Ophthalmology;  Laterality: Right;  CDE: 4.54  . COLONOSCOPY  06/19/2008    RMR: tortuous and elongated colon with scattered left-sided diverticula/colonic mucosa appeared entirely normal. Prior colonic ulcers had resolved.  . COLONOSCOPY  12/2007   Dr. Gala Romney: Scattered diffuse sigmoid diverticula, 2 areas of ulceration at the hepatic flexure. Biopsies unremarkable.  . COLONOSCOPY N/A 11/20/2012   next TCS 11/2017  . COLONOSCOPY WITH PROPOFOL N/A 12/22/2017   Procedure: COLONOSCOPY WITH PROPOFOL;  Surgeon: Daneil Dolin, MD; diverticula in the sigmoid and descending colon and 2 tubular adenomas.  No recommendations to repeat.    . DECOMPRESSIVE LUMBAR LAMINECTOMY LEVEL 2  04/20/2012   Procedure: DECOMPRESSIVE LUMBAR LAMINECTOMY LEVEL 2;  Surgeon: Tobi Bastos, MD;  Location: [facility];  Service: Orthopedics;  Laterality: Right;  Decompressive Lumbar Laminectomy of the L4 - L5 and L5 - S1 Complete/Laminectomy L5 on the Right (X-Ray)  . ESOPHAGOGASTRODUODENOSCOPY  12/2007   Dr. 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 Small hiatal hernia. Slightly pale duodenal mucosa (biopsy unremarkable)  . ESOPHAGOGASTRODUODENOSCOPY (EGD) WITH PROPOFOL N/A 01/20/2017   Dr. Gala Romney: Medium sized hiatal hernia empiric esophageal dilation for history of dysphagia  . EYE SURGERY     BIL CATARACTS  . IR KYPHO THORACIC WITH BONE BIOPSY  02/08/2017  . IR RADIOLOGIST EVAL & MGMT  02/02/2017  . JOINT REPLACEMENT    . KNEE ARTHROSCOPY Right   . MALONEY DILATION N/A 01/20/2017   Procedure: Venia Minks DILATION;  Surgeon: Daneil Dolin, MD;  Location: [facility];  Service: Endoscopy;  Laterality: N/A;  . POLYPECTOMY  12/22/2017   Procedure: POLYPECTOMY;  Surgeon: Daneil Dolin, MD;  Location: [facility];  Service: Endoscopy;;  . REVERSE SHOULDER ARTHROPLASTY Right 05/24/2014   Procedure: RIGHT SHOULDER REVERSE ARTHROPLASTY;  Surgeon: Netta Cedars, MD;  Location: [facility];  Service: Orthopedics;  Laterality: Right;  . REVERSE SHOULDER ARTHROPLASTY  Left 06/10/2017   Procedure: LEFT REVERSE SHOULDER ARTHROPLASTY;  Surgeon: Netta Cedars, MD;  Location: [facility];  Service: Orthopedics;  Laterality: Left;  . TOTAL KNEE ARTHROPLASTY  01/24/2012   Procedure: TOTAL KNEE ARTHROPLASTY;  Surgeon: Gearlean Alf, MD;  Location: [facility];  Service: Orthopedics;  Laterality: Right;  . TOTAL KNEE ARTHROPLASTY Left 10/15/2019   Procedure: TOTAL KNEE ARTHROPLASTY;  Surgeon: Gaynelle Arabian, MD;  Location: [facility];  Service: Orthopedics;  Laterality: Left;  73min  . TUBAL LIGATION       OB History    Gravida  3   Para  3   Term  2   Preterm  1   AB      Living  3     SAB      IAB      Ectopic      Multiple      Live Births              Family History  Problem Relation Age of Onset  . Breast cancer Sister   . Colon cancer Sister        two deceased, one  living and terminal, one current undergoing treatment, ages 53, 75, 57, 33  . Hypertension Mother   . Stroke Mother   . Heart attack Mother   . Hypertension Father   . Prostate cancer Father     Social History   Tobacco Use  . Smoking status: Former Smoker    Packs/day: 2.00    Years: 40.00    Pack years: 80.00    Types: Cigarettes    Quit date: 01/19/1992    Years since quitting: 28.2  . Smokeless tobacco: Never Used  Vaping Use  . Vaping Use: Never used  Substance Use Topics  . Alcohol use: No  . Drug use: No    Home Medications Prior to Admission medications   Medication Sig Start Date End Date Taking? Authorizing Provider  albuterol (PROAIR HFA) 108 (90 Base) MCG/ACT inhaler Inhale 2 puffs into the lungs every 6 (six) hours as needed for wheezing or shortness of breath (For COPD).     [provider]  ALPRAZolam Duanne Moron) 0.5 MG tablet Take 0.5 tablets (0.25 mg total) by mouth 2 (two) times daily as needed for anxiety. 10/25/19   Gerlene Fee, NP  Benralizumab 30 MG/ML SOSY Inject 30 mg into the skin. Once A Day Every 56 Days 10/17/19   [provider]  benzonatate (TESSALON) 100 MG capsule Take 1 capsule (100 mg total) by mouth every 8 (eight) hours. 02/29/20   Fredia Sorrow, MD  denosumab (PROLIA) 60 MG/ML SOSY injection Inject 60 mg into the skin every 6 (six) months. 03/01/18   [provider]  DULERA 200-5 MCG/ACT AERO Inhale 2 puffs into the lungs 2 (two) times daily. 10/25/19   Gerlene Fee, NP  esomeprazole (NEXIUM) 40 MG capsule Take 1 capsule (40 mg total) by mouth 2 (two) times daily. 10/25/19   Gerlene Fee, NP  ferrous sulfate 325 (65 FE) MG tablet Take 1 tablet (325 mg total) by mouth daily with breakfast. 10/25/19   Gerlene Fee, NP  fexofenadine (ALLEGRA) 180 MG tablet Take 180 mg by mouth daily.    [provider]  fluticasone (FLONASE) 50 MCG/ACT nasal spray Place 1 spray into both nostrils daily as needed for allergies or rhinitis.  10/17/19   [provider]  ketotifen (ZADITOR) 0.025 % ophthalmic solution Place 1 drop into both eyes 2 (two) times daily as needed (allergies). 10/25/19   Gerlene Fee, NP  Menthol, Topical Analgesic, (BIOFREEZE) 4 % GEL Apply 1 application topically 2 (two) times daily. Special Instructions: for back pain 10/23/19   [provider]  montelukast (SINGULAIR) 10 MG tablet Take 1 tablet (10 mg total) by mouth at bedtime. 10/25/19   Gerlene Fee, NP  naloxegol oxalate (MOVANTIK) 25 MG TABS tablet Take 1 tablet (25 mg total) by mouth daily. 02/11/20   Erenest Rasher, PA-C  olmesartan (BENICAR) 40 MG tablet Take 1 tablet (40 mg total) by mouth daily. 10/25/19   Gerlene Fee, NP  ondansetron (ZOFRAN) 4 MG tablet Take 1 tablet (4 mg total) by mouth every 8 (eight) hours as needed for nausea or vomiting. Patient not taking: Reported on 03/19/2020 01/14/20   Erenest Rasher, PA-C  Oxycodone HCl 10 MG TABS Take 10 mg by mouth every 6 (six) hours. 12/29/19   [provider]  Polyethyl Glycol-Propyl Glycol (SYSTANE OP) Place 1 drop  into both eyes 3 (three) times daily as needed (dry/irritated eyes.).     [provider]  potassium chloride (KLOR-CON) 10 MEQ tablet Take 1 tablet (10 mEq total) by mouth every other day. IN THE MORNING 10/25/19   Gerlene Fee, NP  torsemide (DEMADEX) 10 MG tablet Take 1 tablet (10 mg total) by mouth every other day. IN THE MORNING 10/25/19   Gerlene Fee, NP    Allergies    Flexeril [cyclobenzaprine], Other, Keflex [cephalexin], Aspirin, Statins, Adhesive [tape], and Chlorhexidine  Review of Systems   Review of Systems  Respiratory: Positive for wheezing.   Psychiatric/Behavioral: The patient is nervous/anxious.   All other systems reviewed and are negative.   Physical Exam Updated Vital Signs BP 139/75   Pulse 95   Temp 98.2 F (36.8 C) (Oral)   Resp 11   Ht 5' (1.524 m)   Wt 85.4 kg   SpO2 100%   BMI 36.76 kg/m   Physical Exam Vitals and nursing note reviewed.  Constitutional:      General: She is in acute distress.     Appearance: Normal appearance. She is obese. She is not ill-appearing or toxic-appearing.     Comments: Patient is very anxious and very upset.  She is tearful and wheezing.  HENT:     Head: Normocephalic and atraumatic.  Eyes:     Extraocular Movements: Extraocular movements intact.     Conjunctiva/sclera: Conjunctivae normal.     Pupils: Pupils are equal, round, and reactive to light.  Cardiovascular:     Rate and Rhythm: Normal rate and regular rhythm.     Pulses: Normal pulses.     Heart sounds: Normal heart sounds. No murmur heard.   Pulmonary:     Effort: Tachypnea and respiratory distress present. No accessory muscle usage or prolonged expiration.     Breath sounds: No stridor. Wheezing present. No rhonchi or rales.  Abdominal:     General: Abdomen is protuberant. Bowel sounds are normal.     Palpations: Abdomen is soft. There is no mass.     Tenderness: There is no guarding or rebound.       Comments: She is not tender  in the right upper quadrant.  Musculoskeletal:        General: Normal range of motion.     Cervical back: Normal range of motion.     Right lower leg: No edema.     Left lower leg: No edema.  Skin:    General: Skin is warm and dry.  Neurological:     General: No focal deficit present.     Mental Status: She is alert and oriented to person, place, and time.     Cranial Nerves: No cranial nerve deficit.  Psychiatric:        Mood and Affect: Mood is anxious.        Speech: Speech is rapid and pressured.        Behavior: Behavior is agitated.     ED Results / Procedures / Treatments   Labs (all labs ordered are listed, but only abnormal results are displayed) Results for orders placed or performed during the hospital encounter of 04/08/20  Comprehensive metabolic panel  Result Value Ref Range   Sodium 134 (L) 135 - 145 mmol/L   Potassium 4.3 3.5 - 5.1 mmol/L   Chloride 100 98 - 111 mmol/L   CO2 25 22 - 32 mmol/L   Glucose, Bld 114 (H) 70 - 99 mg/dL   BUN 28 (H) 8 - 23 mg/dL   Creatinine, Ser 1.82 (H) 0.44 - 1.00  mg/dL   Calcium 8.6 (L) 8.9 - 10.3 mg/dL   Total Protein 6.5 6.5 - 8.1 g/dL   Albumin 3.5 3.5 - 5.0 g/dL   AST 26 15 - 41 U/L   ALT 28 0 - 44 U/L   Alkaline Phosphatase 46 38 - 126 U/L   Total Bilirubin 0.7 0.3 - 1.2 mg/dL   GFR, Estimated 28 (L) >60 mL/min   Anion gap 9 5 - 15  CBC with Differential  Result Value Ref Range   WBC 9.3 4.0 - 10.5 K/uL   RBC 3.90 3.87 - 5.11 MIL/uL   Hemoglobin 10.0 (L) 12.0 - 15.0 g/dL   HCT 32.5 (L) 36.0 - 46.0 %   MCV 83.3 80.0 - 100.0 fL   MCH 25.6 (L) 26.0 - 34.0 pg   MCHC 30.8 30.0 - 36.0 g/dL   RDW 17.1 (H) 11.5 - 15.5 %   Platelets 262 150 - 400 K/uL   nRBC 0.2 0.0 - 0.2 %   Neutrophils Relative % 65 %   Neutro Abs 6.0 1.7 - 7.7 K/uL   Lymphocytes Relative 28 %   Lymphs Abs 2.6 0.7 - 4.0 K/uL   Monocytes Relative 7 %   Monocytes Absolute 0.7 0.1 - 1.0 K/uL   Eosinophils Relative 0 %   Eosinophils Absolute 0.0 0.0 -  0.5 K/uL   Basophils Relative 0 %   Basophils Absolute 0.0 0.0 - 0.1 K/uL   Immature Granulocytes 0 %   Abs Immature Granulocytes 0.04 0.00 - 0.07 K/uL  Lipase, blood  Result Value Ref Range   Lipase 20 11 - 51 U/L  Lactic acid, plasma  Result Value Ref Range   Lactic Acid, Venous 1.2 0.5 - 1.9 mmol/L  Urinalysis, Routine w reflex microscopic Urine, Clean Catch  Result Value Ref Range   Color, Urine COLORLESS (A) YELLOW   APPearance CLEAR CLEAR   Specific Gravity, Urine 1.005 1.005 - 1.030   pH 7.0 5.0 - 8.0   Glucose, UA NEGATIVE NEGATIVE mg/dL   Hgb urine dipstick NEGATIVE NEGATIVE   Bilirubin Urine NEGATIVE NEGATIVE   Ketones, ur NEGATIVE NEGATIVE mg/dL   Protein, ur NEGATIVE NEGATIVE mg/dL   Nitrite NEGATIVE NEGATIVE   Leukocytes,Ua NEGATIVE NEGATIVE   Laboratory interpretation all normal except renal insufficiency    EKG None  Radiology CT Abdomen Pelvis Wo Contrast  Result Date: 04/08/2020 CLINICAL DATA:  80 year old female with abdominal pain since 2300 hours. Points to the umbilical region as the area of pain. EXAM: CT ABDOMEN AND PELVIS WITHOUT CONTRAST TECHNIQUE: Multidetector CT imaging of the abdomen and pelvis was performed following the standard protocol without IV contrast. COMPARISON:  [facility] CT Abdomen and Pelvis 11/08/2019, and earlier. FINDINGS: Lower chest: Bulky chronic gastric hiatal hernia partially visible, stable since August. No cardiomegaly, pericardial or pleural effusions. Mild left lung base scarring is stable. Hepatobiliary: Noncontrast liver is stable from a 2018 CT, with vague hypodense area in the posterior liver dome unchanged and likely benign (series 2, image 8 today). Negative gallbladder. Pancreas: Negative. Spleen: Negative. Adrenals/Urinary Tract: Normal adrenal glands. Negative noncontrast kidneys, ureters, bladder. Stable pelvic phleboliths. Stomach/Bowel: Diverticulosis of the sigmoid and distal descending colon with  resolved inflammation there seen in August. Redundant large bowel with retained stool otherwise not significantly changed. Normal appendix visible on series 2, image 50, coronal image 47. No large bowel inflammation today. Negative terminal ileum. Small bowel is stable, nondilated. Small fat containing umbilical hernia is stable. No small bowel  mesenteric stranding. Chronic hiatal hernia. Intra-abdominal stomach is remarkable for a curvilinear 3-3.5 cm hyperdense foreign body situated transverse in the mid to distal stomach, best seen on series 2, images 19 and 20. This resembles a fishbone. It appears to be lodged within the rugal folds, and might extend into the wall at the posterior lesser sac (series 2, image 21), but there is no surrounding perigastric inflammatory stranding or extraluminal gas. See also sagittal image 56. Negative duodenum. Vascular/Lymphatic: Aortoiliac calcified atherosclerosis. Stable caliber of the abdominal aorta, mildly tortuous. Vascular patency is not evaluated in the absence of IV contrast. No lymphadenopathy. Reproductive: Surgically absent uterus as before. Diminutive or absent ovaries. Other: No pelvic free fluid. Musculoskeletal: Chronic dextroconvex lumbar scoliosis with widespread vertebral compression fractures. Previously augmented T11 fracture. Chronic spondylolisthesis at L5-S1. Chronic postoperative changes to the lower lumbar posterior elements. Since August, no new osseous abnormality identified. IMPRESSION: 1. Positive for a linear 3 to 3.5 cm length density lodged transversely in the distal stomach. This most resembles a Retained Chicken Bone. No evidence of associated gastric perforation. Recommend Endoscopy. Note there is superimposed chronic hiatal hernia with approximately 50% intrathoracic stomach. 2. No other acute or inflammatory process identified in the noncontrast abdomen or pelvis. Normal appendix. Chronic diverticulosis of the distal large bowel. Chronic  vertebral compression fractures and postoperative changes. Aortic Atherosclerosis (ICD10-I70.0). Electronically Signed   By: Genevie Ann M.D.   On: 04/08/2020 04:20    Procedures .Critical Care Performed by: Rolland Porter, MD Authorized by: Rolland Porter, MD   Critical care provider statement:    Critical care time (minutes):  35   Critical care was necessary to treat or prevent imminent or life-threatening deterioration of the following conditions: Gastroentestinal compromise.   Critical care was time spent personally by me on the following activities:  Discussions with consultants, examination of patient, obtaining history from patient or surrogate, ordering and review of laboratory studies, ordering and review of radiographic studies, re-evaluation of patient's condition and pulse oximetry    Medications Ordered in ED Medications  fentaNYL (SUBLIMAZE) injection 50 mcg (has no administration in time range)  sodium chloride 0.9 % bolus 500 mL (0 mLs Intravenous Stopped 04/08/20 0455)  fentaNYL (SUBLIMAZE) injection 50 mcg (50 mcg Intravenous Given 04/08/20 0340)  ondansetron (ZOFRAN) injection 4 mg (4 mg Intravenous Given 04/08/20 0340)  albuterol (VENTOLIN HFA) 108 (90 Base) MCG/ACT inhaler 8 puff (8 puffs Inhalation Given 04/08/20 0338)  aerochamber Z-Stat Plus/medium 1 each (1 each Other Given 04/08/20 0341)    ED Course  I have reviewed the triage vital signs and the nursing notes.  Pertinent labs & imaging results that were available during my care of the patient were reviewed by me and considered in my medical decision making (see chart for details).    MDM Rules/Calculators/A&P                          Patient was given IV fluids IV pain and nausea medication.  CT without contrast was ordered due to her underlying kidney disease.  I consider doing a CTA to look for mesenteric ischemia but thought I would start with a on contrasted CT and do a lactic acid also.  Recheck at 5:00 AM patient  had told me before she does not eat meat however she states she does eat chicken.  She states the last time she remembers eating chicken was on January 15 and she states she does remember  choking however she ate a biscuit and drink some water and it did finally go down.  She states she has not had any other meat since that time.  She states she has had endoscopy and colonoscopy before by Dr. Gala Romney, gastroenterologist.  I will talk to the gastroenterologist on call about doing endoscopy to remove this foreign body.  5:15 AM Dr. Abbey Chatters, gastroenterology, discussed patient's CT results, he will do her endoscopy this morning.  Final Clinical Impression(s) / ED Diagnoses Final diagnoses:  Foreign body in stomach, initial encounter    Rx / DC Orders  Disposition pending Endoscopy by Dr Jobe Igo, MD, Barbette Or, MD 04/08/20 [phone]

## 2020-04-08 NOTE — Consult Note (Signed)
Consulting  Provider: Dr. Tomi Bamberger Primary Care Physician:  Celene Squibb, MD Primary Gastroenterologist:  Dr. Sydell Axon  Reason for Consultation:  Gastric foreign body  HPI:  Glenda Terry is a 80 y.o. female with a past medical history of chronic GERD, hiatal hernia, who presented to [facility], ER early this morning after waking up with acute epigastric pain proximal 1130 last night.  Describes the pain as stabbing, does not radiate.  At times it is 10 out of 10.  No associated nausea vomiting.  No abdominal distention.  No difficulty swallowing.  Does have a history of hiatal hernia in the past on previous EGD.  Work-up in the ER included CT scan which I personally reviewed.  This shows a linear 3 to 3-1/2 cm density lodged transversely in the distal stomach mostly resembling a chicken bone.  Patient states she ate chicken approximately 10 days ago no evidence of perforation noted on CT imaging also noted superimposed chronic condyle hernia with approximately 50% intrathoracic stomach.  We have been consulted regarding patient's foreign body  Past Medical History:  Diagnosis Date  . Anxiety   . Asthma   . Back pain, chronic   . CKD (chronic kidney disease) stage 3, GFR 30-59 ml/min (HCC)   . Compression fx, thoracic spine (HCC)    T - 11  . COPD (chronic obstructive pulmonary disease) ([place]) 12/16/2015  . 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 Essential hypertension   . GERD (gastroesophageal reflux disease)   . HOH (hard of hearing)   . Inflammatory arthritis   . Iron deficiency anemia 01/2012  . Pulmonary eosinophilia ([place])   . Spinal stenosis   . Tubular adenoma 11/2012    Past Surgical History:  Procedure Laterality Date  . ABDOMINAL HYSTERECTOMY    . BACK SURGERY    . CARPAL TUNNEL RELEASE Right 4/14  . CATARACT EXTRACTION W/PHACO Left 11/19/2014   Procedure: CATARACT EXTRACTION PHACO AND INTRAOCULAR LENS PLACEMENT (IOC);  Surgeon: Williams Che, MD;  Location: [facility];  Service:  Ophthalmology;  Laterality: Left;  CDE: 4.77  . CATARACT EXTRACTION W/PHACO Right 02/03/2015   Procedure: CATARACT EXTRACTION PHACO AND INTRAOCULAR LENS PLACEMENT ([place]);  Surgeon: Williams Che, MD;  Location: [facility];  Service: Ophthalmology;  Laterality: Right;  CDE: 4.54  . COLONOSCOPY  06/19/2008   RMR: tortuous and elongated colon with scattered left-sided diverticula/colonic mucosa appeared entirely normal. Prior colonic ulcers had resolved.  . COLONOSCOPY  12/2007   Dr. Gala Romney: Scattered diffuse sigmoid diverticula, 2 areas of ulceration at the hepatic flexure. Biopsies unremarkable.  . COLONOSCOPY N/A 11/20/2012   next TCS 11/2017  . COLONOSCOPY WITH PROPOFOL N/A 12/22/2017   Procedure: COLONOSCOPY WITH PROPOFOL;  Surgeon: Daneil Dolin, MD; diverticula in the sigmoid and descending colon and 2 tubular adenomas.  No recommendations to repeat.    . DECOMPRESSIVE LUMBAR LAMINECTOMY LEVEL 2  04/20/2012   Procedure: DECOMPRESSIVE LUMBAR LAMINECTOMY LEVEL 2;  Surgeon: Tobi Bastos, MD;  Location: [facility];  Service: Orthopedics;  Laterality: Right;  Decompressive Lumbar Laminectomy of the L4 - L5 and L5 - S1 Complete/Laminectomy L5 on the Right (X-Ray)  . ESOPHAGOGASTRODUODENOSCOPY  12/2007   Dr. 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 Small hiatal hernia. Slightly pale duodenal mucosa (biopsy unremarkable)  . ESOPHAGOGASTRODUODENOSCOPY (EGD) WITH PROPOFOL N/A 01/20/2017   Dr. Gala Romney: Medium sized hiatal hernia empiric esophageal dilation for history of dysphagia  . EYE SURGERY  BIL CATARACTS  . IR KYPHO THORACIC WITH BONE BIOPSY  02/08/2017  . IR RADIOLOGIST EVAL & MGMT  02/02/2017  . JOINT REPLACEMENT    . KNEE ARTHROSCOPY Right   . MALONEY DILATION N/A 01/20/2017   Procedure: Venia Minks DILATION;  Surgeon: Daneil Dolin, MD;  [facility];  Service: Endoscopy;  Laterality: N/A;  . POLYPECTOMY  12/22/2017   Procedure:  POLYPECTOMY;  Surgeon: Daneil Dolin, MD;  [facility];  Service: Endoscopy;;  . REVERSE SHOULDER ARTHROPLASTY Right 05/24/2014   Procedure: RIGHT SHOULDER REVERSE ARTHROPLASTY;  Surgeon: Netta Cedars, MD;  Location: [place];  Service: Orthopedics;  Laterality: Right;  . REVERSE SHOULDER ARTHROPLASTY Left 06/10/2017   Procedure: LEFT REVERSE SHOULDER ARTHROPLASTY;  Surgeon: Netta Cedars, MD;  Location: [place];  Service: Orthopedics;  Laterality: Left;  . TOTAL KNEE ARTHROPLASTY  01/24/2012   Procedure: TOTAL KNEE ARTHROPLASTY;  Surgeon: Gearlean Alf, MD;  Location: [facility];  Service: Orthopedics;  Laterality: Right;  . TOTAL KNEE ARTHROPLASTY Left 10/15/2019   Procedure: TOTAL KNEE ARTHROPLASTY;  Surgeon: Gaynelle Arabian, MD;  Location: [facility];  Service: Orthopedics;  Laterality: Left;  91min  . TUBAL LIGATION      Prior to Admission medications   Medication Sig Start Date End Date Taking? Authorizing Provider  albuterol (PROAIR HFA) 108 (90 Base) MCG/ACT inhaler Inhale 2 puffs into the lungs every 6 (six) hours as needed for wheezing or shortness of breath (For COPD).    Yes [provider]  ALPRAZolam Duanne Moron) 0.5 MG tablet Take 0.5 tablets (0.25 mg total) by mouth 2 (two) times daily as needed for anxiety. 10/25/19  Yes Gerlene Fee, NP  esomeprazole (NEXIUM) 40 MG capsule Take 1 capsule (40 mg total) by mouth 2 (two) times daily. 10/25/19  Yes Gerlene Fee, NP  ferrous sulfate 325 (65 FE) MG tablet Take 1 tablet (325 mg total) by mouth daily with breakfast. 10/25/19  Yes Gerlene Fee, NP  fexofenadine (ALLEGRA) 180 MG tablet Take 180 mg by mouth daily.   Yes [provider]  fluticasone (FLONASE) 50 MCG/ACT nasal spray Place 1 spray into both nostrils daily as needed for allergies or rhinitis.  10/17/19  Yes [provider]  ketotifen (ZADITOR) 0.025 % ophthalmic solution Place 1 drop into both eyes 2 (two) times daily as needed (allergies). 10/25/19   Yes Gerlene Fee, NP  montelukast (SINGULAIR) 10 MG tablet Take 1 tablet (10 mg total) by mouth at bedtime. 10/25/19  Yes Gerlene Fee, NP  naloxegol oxalate (MOVANTIK) 25 MG TABS tablet Take 1 tablet (25 mg total) by mouth daily. 02/11/20  Yes Erenest Rasher, PA-C  olmesartan (BENICAR) 40 MG tablet Take 1 tablet (40 mg total) by mouth daily. 10/25/19  Yes Gerlene Fee, NP  Oxycodone HCl 10 MG TABS Take 10 mg by mouth every 6 (six) hours. 12/29/19  Yes [provider]  Benralizumab 30 MG/ML SOSY Inject 30 mg into the skin. Once A Day Every 56 Days 10/17/19   [provider]  benzonatate (TESSALON) 100 MG capsule Take 1 capsule (100 mg total) by mouth every 8 (eight) hours. 02/29/20   Fredia Sorrow, MD  denosumab (PROLIA) 60 MG/ML SOSY injection Inject 60 mg into the skin every 6 (six) months. 03/01/18   [provider]  DULERA 200-5 MCG/ACT AERO Inhale 2 puffs into the lungs 2 (two) times daily. 10/25/19   Gerlene Fee, NP  Menthol, Topical  Analgesic, (BIOFREEZE) 4 % GEL Apply 1 application topically 2 (two) times daily. Special Instructions: for back pain 10/23/19   [provider]  ondansetron (ZOFRAN) 4 MG tablet Take 1 tablet (4 mg total) by mouth every 8 (eight) hours as needed for nausea or vomiting. Patient not taking: Reported on 03/19/2020 01/14/20   Erenest Rasher, PA-C  Polyethyl Glycol-Propyl Glycol (SYSTANE OP) Place 1 drop into both eyes 3 (three) times daily as needed (dry/irritated eyes.).     [provider]  potassium chloride (KLOR-CON) 10 MEQ tablet Take 1 tablet (10 mEq total) by mouth every other day. IN THE MORNING 10/25/19   Gerlene Fee, NP  torsemide (DEMADEX) 10 MG tablet Take 1 tablet (10 mg total) by mouth every other day. IN THE MORNING 10/25/19   Gerlene Fee, NP    Current Facility-Administered Medications  Medication Dose Route Frequency Provider Last Rate Last Admin  . 0.9 %  sodium chloride  infusion   Intravenous Continuous Hurshel Keys K, DO      . lactated ringers infusion   Intravenous Continuous Louann Sjogren, MD   New Bag at 04/08/20 [phone]  . midazolam (VERSED) injection 1 mg  1 mg Intravenous PRN Louann Sjogren, MD   1 mg at 04/08/20 [id number]    Allergies as of 04/08/2020 - Review Complete 04/08/2020  Allergen Reaction Noted  . Flexeril [cyclobenzaprine] Anaphylaxis 01/12/2012  . Other Anaphylaxis and Other (See Comments) 02/08/2011  . Keflex [cephalexin] Itching 05/26/2012  . Aspirin Other (See Comments) 05/05/2015  . Statins Swelling and Other (See Comments) 05/17/2011  . Adhesive [tape] Rash 11/01/2014  . Chlorhexidine Rash 11/19/2014    Family History  Problem Relation Age of Onset  . Breast cancer Sister   . Colon cancer Sister        two deceased, one living and terminal, one current undergoing treatment, ages 14, 69, 66, 53  . Hypertension Mother   . Stroke Mother   . Heart attack Mother   . Hypertension Father   . Prostate cancer Father     Social History   Socioeconomic History  . Marital status: Single    Spouse name: Not on file  . Number of children: 3  . Years of education: Not on file  . Highest education level: Not on file  Occupational History  . Not on file  Tobacco Use  . Smoking status: Former Smoker    Packs/day: 2.00    Years: 40.00    Pack years: 80.00    Types: Cigarettes    Quit date: 01/19/1992    Years since quitting: 28.2  . Smokeless tobacco: Never Used  Vaping Use  . Vaping Use: Never used  Substance and Sexual Activity  . Alcohol use: No  . Drug use: No  . Sexual activity: Never  Other Topics Concern  . Not on file  Social History Narrative  . Not on file   Social Determinants of Health   Financial Resource Strain: Not on file  Food Insecurity: Not on file  Transportation Needs: Not on file  Physical Activity: Not on file  Stress: Not on file  Social Connections: Not on file  Intimate Partner Violence:  Not on file    Review of Systems: General: Negative for anorexia, weight loss, fever, chills, fatigue, weakness. Eyes: Negative for vision changes.  ENT: Negative for hoarseness, difficulty swallowing , nasal congestion. CV: Negative for chest pain, angina, palpitations, dyspnea on exertion, peripheral edema.  Respiratory: Negative for dyspnea at rest, dyspnea on exertion, cough, sputum, wheezing.  GI: See history of present illness. GU:  Negative for dysuria, hematuria, urinary incontinence, urinary frequency, nocturnal urination.  MS: Negative for joint pain, low back pain.  Derm: Negative for rash or itching.  Neuro: Negative for weakness, abnormal sensation, seizure, frequent headaches, memory loss, confusion.  Psych: Negative for anxiety, depression, suicidal ideation, hallucinations.  Endo: Negative for unusual weight change.  Heme: Negative for bruising or bleeding. Allergy: Negative for rash or hives.  Physical Exam: Vital signs in last 24 hours: Temp:  [98.2 F (36.8 C)-99 F (37.2 C)] 99 F (37.2 C) (01/25 0729) Pulse Rate:  [74-118] 118 (01/25 0729) Resp:  [11-22] 17 (01/25 0729) BP: (107-175)/(61-98) 140/97 (01/25 0729) SpO2:  [97 %-100 %] 97 % (01/25 0729) Weight:  [85.4 kg-85.7 kg] 85.7 kg (01/25 0729)   General:   Alert,  Well-developed, well-nourished, pleasant and cooperative in mild distress Head:  Normocephalic and atraumatic. Eyes:  Sclera clear, no icterus.   Conjunctiva pink. Ears:  Normal auditory acuity. Nose:  No deformity, discharge,  or lesions. Mouth:  No deformity or lesions, dentition normal. Neck:  Supple; no masses or thyromegaly. Lungs:  Clear throughout to auscultation.   No wheezes, crackles, or rhonchi. No acute distress. Heart:  Tachy with regular rhythm; no murmurs, clicks, rubs,  or gallops. Abdomen:  Soft, tender in epigastric region, nondistended. No masses, hepatosplenomegaly or hernias noted. Normal bowel sounds, without guarding, and  without rebound.   Msk:  Symmetrical without gross deformities. Normal posture. Pulses:  Normal pulses noted. Extremities:  Without clubbing or edema. Neurologic:  Alert and  oriented x4;  grossly normal neurologically. Skin:  Intact without significant lesions or rashes. Cervical Nodes:  No significant cervical adenopathy. Psych:  Alert and cooperative. Normal mood and affect.  Intake/Output from previous day: 01/24 0701 - 01/25 0700 In: 500 [IV Piggyback:500] Out: -  Intake/Output this shift: No intake/output data recorded.  Lab Results: Recent Labs    04/08/20 0330  WBC 9.3  HGB 10.0*  HCT 32.5*  PLT 262   BMET Recent Labs    04/08/20 0330  NA 134*  K 4.3  CL 100  CO2 25  GLUCOSE 114*  BUN 28*  CREATININE 1.82*  CALCIUM 8.6*   LFT Recent Labs    04/08/20 0330  PROT 6.5  ALBUMIN 3.5  AST 26  ALT 28  ALKPHOS 46  BILITOT 0.7   PT/INR No results for input(s): LABPROT, INR in the last 72 hours. Hepatitis Panel No results for input(s): HEPBSAG, HCVAB, HEPAIGM, HEPBIGM in the last 72 hours. C-Diff No results for input(s): CDIFFTOX in the last 72 hours.  Studies/Results: CT Abdomen Pelvis Wo Contrast  Result Date: 04/08/2020 CLINICAL DATA:  80 year old female with abdominal pain since 2300 hours. Points to the umbilical region as the area of pain. EXAM: CT ABDOMEN AND PELVIS WITHOUT CONTRAST TECHNIQUE: Multidetector CT imaging of the abdomen and pelvis was performed following the standard protocol without IV contrast. COMPARISON:  [facility] CT Abdomen and Pelvis 11/08/2019, and earlier. FINDINGS: Lower chest: Bulky chronic gastric hiatal hernia partially visible, stable since August. No cardiomegaly, pericardial or pleural effusions. Mild left lung base scarring is stable. Hepatobiliary: Noncontrast liver is stable from a 2018 CT, with vague hypodense area in the posterior liver dome unchanged and likely benign (series 2, image 8 today). Negative  gallbladder. Pancreas: Negative. Spleen: Negative. Adrenals/Urinary Tract: Normal adrenal glands. Negative noncontrast kidneys,  ureters, bladder. Stable pelvic phleboliths. Stomach/Bowel: Diverticulosis of the sigmoid and distal descending colon with resolved inflammation there seen in August. Redundant large bowel with retained stool otherwise not significantly changed. Normal appendix visible on series 2, image 50, coronal image 47. No large bowel inflammation today. Negative terminal ileum. Small bowel is stable, nondilated. Small fat containing umbilical hernia is stable. No small bowel mesenteric stranding. Chronic hiatal hernia. Intra-abdominal stomach is remarkable for a curvilinear 3-3.5 cm hyperdense foreign body situated transverse in the mid to distal stomach, best seen on series 2, images 19 and 20. This resembles a fishbone. It appears to be lodged within the rugal folds, and might extend into the wall at the posterior lesser sac (series 2, image 21), but there is no surrounding perigastric inflammatory stranding or extraluminal gas. See also sagittal image 56. Negative duodenum. Vascular/Lymphatic: Aortoiliac calcified atherosclerosis. Stable caliber of the abdominal aorta, mildly tortuous. Vascular patency is not evaluated in the absence of IV contrast. No lymphadenopathy. Reproductive: Surgically absent uterus as before. Diminutive or absent ovaries. Other: No pelvic free fluid. Musculoskeletal: Chronic dextroconvex lumbar scoliosis with widespread vertebral compression fractures. Previously augmented T11 fracture. Chronic spondylolisthesis at L5-S1. Chronic postoperative changes to the lower lumbar posterior elements. Since August, no new osseous abnormality identified. IMPRESSION: 1. Positive for a linear 3 to 3.5 cm length density lodged transversely in the distal stomach. This most resembles a Retained Chicken Bone. No evidence of associated gastric perforation. Recommend Endoscopy. Note there is  superimposed chronic hiatal hernia with approximately 50% intrathoracic stomach. 2. No other acute or inflammatory process identified in the noncontrast abdomen or pelvis. Normal appendix. Chronic diverticulosis of the distal large bowel. Chronic vertebral compression fractures and postoperative changes. Aortic Atherosclerosis (ICD10-I70.0). Electronically Signed   By: Genevie Ann M.D.   On: 04/08/2020 04:20    Impression: *Acute epigastric pain *Foreign body in stomach *Chronic GERD  Plan: Given the size and sharpness of foreign body, we will proceed with emergent EGD for removal.  The risks including infection, bleed, or perforation as well as benefits, limitations, alternatives and imponderables have been reviewed with the patient. Potential for esophageal dilation, biopsy, etc. have also been reviewed.  Questions have been answered. All parties agreeable.  Please keep n.p.o. for now.   Elon Alas. Abbey Chatters, D.O. Gastroenterology and Hepatology [facility]    LOS: 0 days     04/08/2020, 8:07 AM

## 2020-04-08 NOTE — Anesthesia Preprocedure Evaluation (Signed)
Anesthesia Evaluation  Patient identified by MRN, date of birth, ID band Patient awake    Reviewed: Allergy & Precautions, H&P , NPO status , Patient's Chart, lab work & pertinent test results, reviewed documented beta blocker date and time   Airway Mallampati: II  TM Distance: >3 FB Neck ROM: full    Dental no notable dental hx. (+) Edentulous Lower, Edentulous Upper   Pulmonary asthma , COPD, former smoker,    Pulmonary exam normal breath sounds clear to auscultation       Cardiovascular Exercise Tolerance: Good hypertension, negative cardio ROS   Rhythm:regular Rate:Normal     Neuro/Psych PSYCHIATRIC DISORDERS Anxiety negative neurological ROS     GI/Hepatic Neg liver ROS, GERD  Medicated,  Endo/Other  negative endocrine ROS  Renal/GU negative Renal ROS  negative genitourinary   Musculoskeletal   Abdominal   Peds  Hematology  (+) Blood dyscrasia, anemia ,   Anesthesia Other Findings   Reproductive/Obstetrics negative OB ROS                             Anesthesia Physical Anesthesia Plan  ASA: II  Anesthesia Plan: General   Post-op Pain Management:    Induction:   PONV Risk Score and Plan: TIVA and Propofol infusion  Airway Management Planned:   Additional Equipment:   Intra-op Plan:   Post-operative Plan:   Informed Consent: I have reviewed the patients History and Physical, chart, labs and discussed the procedure including the risks, benefits and alternatives for the proposed anesthesia with the patient or authorized representative who has indicated his/her understanding and acceptance.     Dental Advisory Given  Plan Discussed with: CRNA  Anesthesia Plan Comments:         Anesthesia Quick Evaluation

## 2020-04-08 NOTE — Anesthesia Procedure Notes (Signed)
Procedure Name: Intubation Date/Time: 04/08/2020 8:21 AM Performed by: Orlie Dakin, CRNA Pre-anesthesia Checklist: Patient identified, Emergency Drugs available, Suction available and Patient being monitored Patient Re-evaluated:Patient Re-evaluated prior to induction Oxygen Delivery Method: Circle system utilized Preoxygenation: Pre-oxygenation with 100% oxygen Induction Type: Rapid sequence, Cricoid Pressure applied and IV induction Laryngoscope Size: Mac and 4 Grade View: Grade I Tube type: Oral Tube size: 7.0 mm Number of attempts: 1 Airway Equipment and Method: Stylet Placement Confirmation: ETT inserted through vocal cords under direct vision,  positive ETCO2 and breath sounds checked- equal and bilateral Secured at: 23 cm Tube secured with: Tape Dental Injury: Teeth and Oropharynx as per pre-operative assessment

## 2020-04-08 NOTE — Transfer of Care (Signed)
Immediate Anesthesia Transfer of Care Note  Patient: Glenda Terry  Procedure(s) Performed: ESOPHAGOGASTRODUODENOSCOPY (EGD) WITH PROPOFOL (N/A ) FOREIGN BODY REMOVAL (N/A )  Patient Location: PACU  Anesthesia Type:General  Level of Consciousness: awake, alert  and oriented  Airway & Oxygen Therapy: Patient Spontanous Breathing and Patient connected to face mask oxygen  Post-op Assessment: Report given to RN and Post -op Vital signs reviewed and stable  Post vital signs: Reviewed and stable  Last Vitals:  Vitals Value Taken Time  BP 131/70 04/08/20 0842  Temp    Pulse 112 04/08/20 0843  Resp 16 04/08/20 0843  SpO2 100 % 04/08/20 0843  Vitals shown include unvalidated device data.  Last Pain:  Vitals:   04/08/20 0729  TempSrc: Oral  PainSc: 8          Complications: No complications documented.

## 2020-04-08 NOTE — Discharge Instructions (Addendum)
EGD Discharge instructions Please read the instructions outlined below and refer to this sheet in the next few weeks. These discharge instructions provide you with general information on caring for yourself after you leave the hospital. Your doctor may also give you specific instructions. While your treatment has been planned according to the most current medical practices available, unavoidable complications occasionally occur. If you have any problems or questions after discharge, please call your doctor. ACTIVITY  You may resume your regular activity but move at a slower pace for the next 24 hours.   Take frequent rest periods for the next 24 hours.   Walking will help expel (get rid of) the air and reduce the bloated feeling in your abdomen.   No driving for 24 hours (because of the anesthesia (medicine) used during the test).   You may shower.   Do not sign any important legal documents or operate any machinery for 24 hours (because of the anesthesia used during the test).  NUTRITION  Drink plenty of fluids.   You may resume your normal diet.   Begin with a light meal and progress to your normal diet.   Avoid alcoholic beverages for 24 hours or as instructed by your caregiver.  MEDICATIONS  You may resume your normal medications unless your caregiver tells you otherwise.  WHAT YOU CAN EXPECT TODAY  You may experience abdominal discomfort such as a feeling of fullness or "gas" pains.  FOLLOW-UP  Your doctor will discuss the results of your test with you.  SEEK IMMEDIATE MEDICAL ATTENTION IF ANY OF THE FOLLOWING OCCUR:  Excessive nausea (feeling sick to your stomach) and/or vomiting.   Severe abdominal pain and distention (swelling).   Trouble swallowing.   Temperature over 101 F (37.8 C).   Rectal bleeding or vomiting of blood.    You had a large chicken bone wedged in the distal portion of your stomach. I removed this successfully. Your throat may be a little  sore over the next few days but this should improve over time. Follow-up with GI as previously scheduled.  I hope you have a great rest of your week!  Elon Alas. Abbey Chatters, D.O. Gastroenterology and Hepatology [facility]

## 2020-04-08 NOTE — Anesthesia Postprocedure Evaluation (Signed)
Anesthesia Post Note  Patient: Glenda Terry  Procedure(s) Performed: ESOPHAGOGASTRODUODENOSCOPY (EGD) WITH PROPOFOL (N/A ) FOREIGN BODY REMOVAL (N/A )  Patient location during evaluation: PACU Anesthesia Type: General Level of consciousness: awake and alert and oriented Pain management: pain level controlled Vital Signs Assessment: post-procedure vital signs reviewed and stable Respiratory status: spontaneous breathing, nonlabored ventilation and respiratory function stable Cardiovascular status: blood pressure returned to baseline and stable Postop Assessment: no apparent nausea or vomiting Anesthetic complications: no   No complications documented.   Last Vitals:  Vitals:   04/08/20 0908 04/08/20 0914  BP: 135/65 127/70  Pulse: 86   Resp: 16   Temp:  36.9 C  SpO2: 100% 100%    Last Pain:  Vitals:   04/08/20 0914  TempSrc: Oral  PainSc: 0-No pain                 Orlie Dakin

## 2020-04-08 NOTE — ED Triage Notes (Addendum)
Pt pov from home with cc of abdominal pain that started at 11pm and woke up with it. States it is her gallbladder but is pointing to her umbilical area.  SOB in triage.. with wheezing, cough, dyspnea when talking. States she normally breathes better but she is upset and has asthma. Inhaler in hand. States she hasn't been around anyone sick and has had vaccinations.

## 2020-04-08 NOTE — Op Note (Signed)
[facility] Patient Name: Glenda Terry Procedure Date: 04/08/2020 8:00 AM MRN: [id number] Date of Birth: 29-May-1940 Attending MD: Elon Alas. Edgar Frisk CSN: [id number] Age: 80 Admit Type: Emergency Department Procedure:                Upper GI endoscopy Indications:              Foreign body in the stomach Providers:                Elon Alas. Abbey Chatters, DO Referring MD:              Medicines:                See the Anesthesia note for documentation of the                            administered medications Complications:            No immediate complications. Estimated Blood Loss:     Estimated blood loss was minimal. Procedure:                Pre-Anesthesia Assessment:                           - The anesthesia plan was to use general anesthesia.                           After obtaining informed consent, the endoscope was                            passed under direct vision. Throughout the                            procedure, the patient's blood pressure, pulse, and                            oxygen saturations were monitored continuously. The                            [id number]-H190 ([id number]) scope was introduced through the                            mouth, and advanced to the second part of duodenum.                            The upper GI endoscopy was accomplished without                            difficulty. The patient tolerated the procedure                            well. Scope In: 8:24:36 AM Scope Out: 8:34:58 AM Total Procedure Duration: 0 hours 10 minutes 22 seconds  Findings:      A medium-sized hiatal hernia was present.      3-4 cm chicken boned lodged was in the gastric antrum just proximal to       pylorus. Removal was accomplished with a snare. Mild bleeding  at site of       lodged bone.      The duodenal bulb, first portion of the duodenum and second portion of       the duodenum were normal. Impression:               - Medium-sized hiatal hernia.                            - 3-4 cm chicken boned lodged were found in the                            stomach. Removal was successful.                           - Normal duodenal bulb, first portion of the                            duodenum and second portion of the duodenum. Moderate Sedation:      Per Anesthesia Care Recommendation:           - Patient has a contact number available for                            emergencies. The signs and symptoms of potential                            delayed complications were discussed with the                            patient. Return to normal activities tomorrow.                            Written discharge instructions were provided to the                            patient.                           - Resume previous diet.                           - Continue present medications.                           - Return to GI clinic as previously scheduled.                           - No picture on report due to system wide issues                            with Provation. Procedure Code(s):        --- Professional ---                           [phone], Esophagogastroduodenoscopy, flexible,  transoral; with removal of foreign body(s) Diagnosis Code(s):        --- Professional ---                           K44.9, Diaphragmatic hernia without obstruction or                            gangrene                           T18.2XXA, Foreign body in stomach, initial encounter CPT copyright 2019 [employer] Medical Association. All rights reserved. The codes documented in this report are preliminary and upon coder review may  be revised to meet current compliance requirements. Elon Alas. Abbey Chatters, DO [place] Abbey Chatters, DO 04/08/2020 8:39:42 AM This report has been signed electronically. Number of Addenda: 0

## 2020-04-09 ENCOUNTER — Telehealth: Payer: Self-pay | Admitting: Internal Medicine

## 2020-04-09 NOTE — Telephone Encounter (Signed)
Spoke with this pt she had a procedure done yesterday and states today her throat is sore and her abdomen is sore. I advised for now to gargle with some warm salt water. The patient states the abdomen is the main problem and I inquired regarding it and she states its sore when she coughs. I asked her does it hurt any other time and she stated no, only when she coughs. States she put a pillow in front of her when she has to cough but other than that states it doesn't bother her. She wanted me to ask you what should she do or just give it time. Please advise

## 2020-04-09 NOTE — Telephone Encounter (Signed)
noted 

## 2020-04-09 NOTE — Telephone Encounter (Signed)
[phone]  PATIENT HAD A PROCEDURE YESTERDAY AND SHE IS EXTREMLY SORE IN HER THROAT AND STOMACH

## 2020-04-09 NOTE — Telephone Encounter (Signed)
I called and spoke with patient.  She is feeling better.  Discussed that she is likely sore from the procedure itself.  She denies any abdominal tenderness.  States she is eating well.  No abdominal distention.  Discussed that if her symptoms worsen she needs to go to the ER.  Otherwise she can continue to monitor for now.

## 2020-04-09 NOTE — Telephone Encounter (Signed)
Routing message to Dr. Carver's nurse 

## 2020-04-21 ENCOUNTER — Encounter (HOSPITAL_COMMUNITY): Payer: Self-pay | Admitting: Internal Medicine

## 2020-04-22 DIAGNOSIS — K5900 Constipation, unspecified: Secondary | ICD-10-CM | POA: Diagnosis not present

## 2020-04-22 DIAGNOSIS — J449 Chronic obstructive pulmonary disease, unspecified: Secondary | ICD-10-CM | POA: Diagnosis not present

## 2020-04-22 DIAGNOSIS — I509 Heart failure, unspecified: Secondary | ICD-10-CM | POA: Diagnosis not present

## 2020-04-22 DIAGNOSIS — Z8719 Personal history of other diseases of the digestive system: Secondary | ICD-10-CM | POA: Diagnosis not present

## 2020-04-25 DIAGNOSIS — R7303 Prediabetes: Secondary | ICD-10-CM | POA: Diagnosis not present

## 2020-04-25 DIAGNOSIS — N1831 Chronic kidney disease, stage 3a: Secondary | ICD-10-CM | POA: Diagnosis not present

## 2020-04-25 DIAGNOSIS — E211 Secondary hyperparathyroidism, not elsewhere classified: Secondary | ICD-10-CM | POA: Diagnosis not present

## 2020-04-25 DIAGNOSIS — I129 Hypertensive chronic kidney disease with stage 1 through stage 4 chronic kidney disease, or unspecified chronic kidney disease: Secondary | ICD-10-CM | POA: Diagnosis not present

## 2020-04-25 DIAGNOSIS — I5032 Chronic diastolic (congestive) heart failure: Secondary | ICD-10-CM | POA: Diagnosis not present

## 2020-04-25 DIAGNOSIS — R768 Other specified abnormal immunological findings in serum: Secondary | ICD-10-CM | POA: Diagnosis not present

## 2020-04-25 DIAGNOSIS — D638 Anemia in other chronic diseases classified elsewhere: Secondary | ICD-10-CM | POA: Diagnosis not present

## 2020-05-02 DIAGNOSIS — N170 Acute kidney failure with tubular necrosis: Secondary | ICD-10-CM | POA: Diagnosis not present

## 2020-05-02 DIAGNOSIS — I5033 Acute on chronic diastolic (congestive) heart failure: Secondary | ICD-10-CM | POA: Diagnosis not present

## 2020-05-02 DIAGNOSIS — D638 Anemia in other chronic diseases classified elsewhere: Secondary | ICD-10-CM | POA: Diagnosis not present

## 2020-05-02 DIAGNOSIS — N1832 Chronic kidney disease, stage 3b: Secondary | ICD-10-CM | POA: Diagnosis not present

## 2020-05-02 DIAGNOSIS — E211 Secondary hyperparathyroidism, not elsewhere classified: Secondary | ICD-10-CM | POA: Diagnosis not present

## 2020-05-02 DIAGNOSIS — I129 Hypertensive chronic kidney disease with stage 1 through stage 4 chronic kidney disease, or unspecified chronic kidney disease: Secondary | ICD-10-CM | POA: Diagnosis not present

## 2020-05-09 DIAGNOSIS — G8929 Other chronic pain: Secondary | ICD-10-CM | POA: Diagnosis not present

## 2020-05-09 DIAGNOSIS — Z8739 Personal history of other diseases of the musculoskeletal system and connective tissue: Secondary | ICD-10-CM | POA: Diagnosis not present

## 2020-05-09 DIAGNOSIS — I129 Hypertensive chronic kidney disease with stage 1 through stage 4 chronic kidney disease, or unspecified chronic kidney disease: Secondary | ICD-10-CM | POA: Diagnosis not present

## 2020-05-09 DIAGNOSIS — N183 Chronic kidney disease, stage 3 unspecified: Secondary | ICD-10-CM | POA: Diagnosis not present

## 2020-05-09 DIAGNOSIS — M25572 Pain in left ankle and joints of left foot: Secondary | ICD-10-CM | POA: Diagnosis not present

## 2020-05-09 DIAGNOSIS — M76821 Posterior tibial tendinitis, right leg: Secondary | ICD-10-CM | POA: Diagnosis not present

## 2020-05-09 DIAGNOSIS — J449 Chronic obstructive pulmonary disease, unspecified: Secondary | ICD-10-CM | POA: Diagnosis not present

## 2020-05-09 DIAGNOSIS — M76822 Posterior tibial tendinitis, left leg: Secondary | ICD-10-CM | POA: Diagnosis not present

## 2020-05-09 DIAGNOSIS — M25571 Pain in right ankle and joints of right foot: Secondary | ICD-10-CM | POA: Diagnosis not present

## 2020-05-12 DIAGNOSIS — F411 Generalized anxiety disorder: Secondary | ICD-10-CM | POA: Diagnosis not present

## 2020-05-12 DIAGNOSIS — E875 Hyperkalemia: Secondary | ICD-10-CM | POA: Diagnosis not present

## 2020-05-12 DIAGNOSIS — Z712 Person consulting for explanation of examination or test findings: Secondary | ICD-10-CM | POA: Diagnosis not present

## 2020-05-12 DIAGNOSIS — I10 Essential (primary) hypertension: Secondary | ICD-10-CM | POA: Diagnosis not present

## 2020-05-12 DIAGNOSIS — M797 Fibromyalgia: Secondary | ICD-10-CM | POA: Diagnosis not present

## 2020-05-12 DIAGNOSIS — J449 Chronic obstructive pulmonary disease, unspecified: Secondary | ICD-10-CM | POA: Diagnosis not present

## 2020-05-12 DIAGNOSIS — E1122 Type 2 diabetes mellitus with diabetic chronic kidney disease: Secondary | ICD-10-CM | POA: Diagnosis not present

## 2020-05-12 DIAGNOSIS — D509 Iron deficiency anemia, unspecified: Secondary | ICD-10-CM | POA: Diagnosis not present

## 2020-05-12 DIAGNOSIS — J45909 Unspecified asthma, uncomplicated: Secondary | ICD-10-CM | POA: Diagnosis not present

## 2020-05-12 DIAGNOSIS — K219 Gastro-esophageal reflux disease without esophagitis: Secondary | ICD-10-CM | POA: Diagnosis not present

## 2020-05-12 DIAGNOSIS — M8580 Other specified disorders of bone density and structure, unspecified site: Secondary | ICD-10-CM | POA: Diagnosis not present

## 2020-05-19 DIAGNOSIS — I129 Hypertensive chronic kidney disease with stage 1 through stage 4 chronic kidney disease, or unspecified chronic kidney disease: Secondary | ICD-10-CM | POA: Diagnosis not present

## 2020-05-19 DIAGNOSIS — N170 Acute kidney failure with tubular necrosis: Secondary | ICD-10-CM | POA: Diagnosis not present

## 2020-05-19 DIAGNOSIS — D638 Anemia in other chronic diseases classified elsewhere: Secondary | ICD-10-CM | POA: Diagnosis not present

## 2020-05-19 DIAGNOSIS — N1832 Chronic kidney disease, stage 3b: Secondary | ICD-10-CM | POA: Diagnosis not present

## 2020-05-19 DIAGNOSIS — E211 Secondary hyperparathyroidism, not elsewhere classified: Secondary | ICD-10-CM | POA: Diagnosis not present

## 2020-05-19 DIAGNOSIS — I5032 Chronic diastolic (congestive) heart failure: Secondary | ICD-10-CM | POA: Diagnosis not present

## 2020-05-21 DIAGNOSIS — I5033 Acute on chronic diastolic (congestive) heart failure: Secondary | ICD-10-CM | POA: Diagnosis not present

## 2020-05-21 DIAGNOSIS — N1832 Chronic kidney disease, stage 3b: Secondary | ICD-10-CM | POA: Diagnosis not present

## 2020-05-21 DIAGNOSIS — I129 Hypertensive chronic kidney disease with stage 1 through stage 4 chronic kidney disease, or unspecified chronic kidney disease: Secondary | ICD-10-CM | POA: Diagnosis not present

## 2020-05-21 DIAGNOSIS — D638 Anemia in other chronic diseases classified elsewhere: Secondary | ICD-10-CM | POA: Diagnosis not present

## 2020-05-21 DIAGNOSIS — N170 Acute kidney failure with tubular necrosis: Secondary | ICD-10-CM | POA: Diagnosis not present

## 2020-05-23 DIAGNOSIS — J4550 Severe persistent asthma, uncomplicated: Secondary | ICD-10-CM | POA: Diagnosis not present

## 2020-05-23 DIAGNOSIS — D72819 Decreased white blood cell count, unspecified: Secondary | ICD-10-CM | POA: Diagnosis not present

## 2020-05-23 DIAGNOSIS — Z87891 Personal history of nicotine dependence: Secondary | ICD-10-CM | POA: Diagnosis not present

## 2020-06-02 DIAGNOSIS — M1990 Unspecified osteoarthritis, unspecified site: Secondary | ICD-10-CM | POA: Diagnosis not present

## 2020-06-02 DIAGNOSIS — F411 Generalized anxiety disorder: Secondary | ICD-10-CM | POA: Diagnosis not present

## 2020-06-02 DIAGNOSIS — J45909 Unspecified asthma, uncomplicated: Secondary | ICD-10-CM | POA: Diagnosis not present

## 2020-06-02 DIAGNOSIS — I10 Essential (primary) hypertension: Secondary | ICD-10-CM | POA: Diagnosis not present

## 2020-06-02 DIAGNOSIS — M797 Fibromyalgia: Secondary | ICD-10-CM | POA: Diagnosis not present

## 2020-06-02 DIAGNOSIS — M8580 Other specified disorders of bone density and structure, unspecified site: Secondary | ICD-10-CM | POA: Diagnosis not present

## 2020-06-02 DIAGNOSIS — K219 Gastro-esophageal reflux disease without esophagitis: Secondary | ICD-10-CM | POA: Diagnosis not present

## 2020-06-02 DIAGNOSIS — E1122 Type 2 diabetes mellitus with diabetic chronic kidney disease: Secondary | ICD-10-CM | POA: Diagnosis not present

## 2020-06-02 DIAGNOSIS — J449 Chronic obstructive pulmonary disease, unspecified: Secondary | ICD-10-CM | POA: Diagnosis not present

## 2020-06-02 DIAGNOSIS — D509 Iron deficiency anemia, unspecified: Secondary | ICD-10-CM | POA: Diagnosis not present

## 2020-06-02 DIAGNOSIS — E875 Hyperkalemia: Secondary | ICD-10-CM | POA: Diagnosis not present

## 2020-06-06 DIAGNOSIS — M48061 Spinal stenosis, lumbar region without neurogenic claudication: Secondary | ICD-10-CM | POA: Diagnosis not present

## 2020-06-06 DIAGNOSIS — M47816 Spondylosis without myelopathy or radiculopathy, lumbar region: Secondary | ICD-10-CM | POA: Diagnosis not present

## 2020-06-06 DIAGNOSIS — G894 Chronic pain syndrome: Secondary | ICD-10-CM | POA: Diagnosis not present

## 2020-06-11 DIAGNOSIS — J441 Chronic obstructive pulmonary disease with (acute) exacerbation: Secondary | ICD-10-CM | POA: Diagnosis not present

## 2020-06-11 DIAGNOSIS — E1165 Type 2 diabetes mellitus with hyperglycemia: Secondary | ICD-10-CM | POA: Diagnosis not present

## 2020-06-11 DIAGNOSIS — G720 Drug-induced myopathy: Secondary | ICD-10-CM | POA: Diagnosis not present

## 2020-06-11 DIAGNOSIS — M48061 Spinal stenosis, lumbar region without neurogenic claudication: Secondary | ICD-10-CM | POA: Diagnosis not present

## 2020-06-11 DIAGNOSIS — I10 Essential (primary) hypertension: Secondary | ICD-10-CM | POA: Diagnosis not present

## 2020-06-11 DIAGNOSIS — R451 Restlessness and agitation: Secondary | ICD-10-CM | POA: Diagnosis not present

## 2020-06-17 ENCOUNTER — Ambulatory Visit (INDEPENDENT_AMBULATORY_CARE_PROVIDER_SITE_OTHER): Payer: PPO | Admitting: Internal Medicine

## 2020-06-17 ENCOUNTER — Other Ambulatory Visit: Payer: Self-pay

## 2020-06-17 ENCOUNTER — Encounter: Payer: Self-pay | Admitting: Internal Medicine

## 2020-06-17 VITALS — BP 160/100 | HR 89 | Ht 60.0 in | Wt 191.8 lb

## 2020-06-17 DIAGNOSIS — I5032 Chronic diastolic (congestive) heart failure: Secondary | ICD-10-CM

## 2020-06-17 DIAGNOSIS — E782 Mixed hyperlipidemia: Secondary | ICD-10-CM

## 2020-06-17 DIAGNOSIS — M7989 Other specified soft tissue disorders: Secondary | ICD-10-CM

## 2020-06-17 DIAGNOSIS — R600 Localized edema: Secondary | ICD-10-CM

## 2020-06-17 DIAGNOSIS — E669 Obesity, unspecified: Secondary | ICD-10-CM | POA: Diagnosis not present

## 2020-06-17 DIAGNOSIS — J449 Chronic obstructive pulmonary disease, unspecified: Secondary | ICD-10-CM

## 2020-06-17 DIAGNOSIS — I10 Essential (primary) hypertension: Secondary | ICD-10-CM

## 2020-06-17 MED ORDER — AMLODIPINE BESYLATE 5 MG PO TABS: 5.0000 mg | ORAL_TABLET | Freq: Every day | ORAL | 3 refills | Status: DC

## 2020-06-17 NOTE — Patient Instructions (Signed)
Medication Instructions:  START: AMLODIPINE 5mg  (1) TABLET DAILY TAKE AN EXTRA TORSEMIDE 10mg  TOMORROW and THE FOLLOWING DAY  *If you need a refill on your cardiac medications before your next appointment, please call your pharmacy*  Testing/Procedures: Your physician has requested that you have an echocardiogram. Echocardiography is a painless test that uses sound waves to create images of your heart. It provides your doctor with information about the size and shape of your heart and how well your heart's chambers and valves are working. You may receive an ultrasound enhancing agent through an IV if needed to better visualize your heart during the echo.This procedure takes approximately one hour. There are no restrictions for this procedure. This will take place at the [address].   Follow-Up: At [facility], you and your health needs are our priority.  As part of our continuing mission to provide you with exceptional heart care, we have created designated Provider Care Teams.  These Care Teams include your primary Cardiologist (physician) and Advanced Practice Providers (APPs -  Physician Assistants and Nurse Practitioners) who all work together to provide you with the care you need, when you need it.  We recommend signing up for the patient portal called "MyChart".  Sign up information is provided on this After Visit Summary.  MyChart is used to connect with patients for Virtual Visits (Telemedicine).  Patients are able to view lab/test results, encounter notes, upcoming appointments, etc.  Non-urgent messages can be sent to your provider as well.   To learn more about what you can do with MyChart, go to [email or web address].    Your next appointment:   2-3 WEEKS WITH CVRR- BP CHECK   3 MONTHS WITH Dr. Margaretann Loveless   The format for your next appointment:   In Person  Provider:   Cherlynn Kaiser, MD  Other Instructions WEIGH YOUR SELF DAILY AND WRITE THIS NUMBER DOWN-  PLEASE LET us KNOW IF YOUR WEIGHT IS INCREASING OR YOUR LEGS ARE SWELLING.  [place]

## 2020-06-17 NOTE — Progress Notes (Signed)
Cardiology Office Note:    Date:  06/17/2020   ID:  Glenda Terry, DOB 1941-02-24, MRN [id number]  PCP:  Celene Squibb, MD  Cardiologist:  No primary care provider on file.  Electrophysiologist:  None   Referring MD: Celene Squibb, MD   Chief Complaint/Reason for Referral: Reestablish cardiovascular care, former patient of Dr. Domenic Polite  History of Present Illness:    Glenda Terry is a 80 y.o. female with a history of HTN, CKD stage 3, anxiety, COPD, cushing's syndrome, GERD, inflammatory arthritis, spinal stenosis, pulmonary eosinophilia, and HFpEF who presents to reestablish care and with concerns of LE edema and HTN. Presents today with her [name] who is my patient as well. Glenda Terry helps greatly with the collaborative history.  Has been previously evaluated for palpitations, cardiac monitor showed sinus tach and SR in 2014. Had an echo in 2017 that was grossly normal. Seen in 2018, no significant pathology identified. Has not been seen by cardiology since.   The patient denies chest pain, chest pressure, PND, orthopnea. Denies cough, fever, chills. Denies nausea, vomiting. Denies syncope or presyncope. Denies dizziness or lightheadedness.   Has recently had AKI, olmesartan held by nephrology. She has struggled with abdominal pain which she feels came from a chicken bone stuck in her stomach. BP subsequently elevated, and is elevated today.  LE edema felt related to diastolic HF and patient encouraged to take diuretic daily. Referral to cardiology to ensure no other cardiomyopathic process. 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 BP not at goal today.   Past Medical History:  Diagnosis Date   Anxiety    Asthma    Back pain, chronic    CKD (chronic kidney disease) stage 3, GFR 30-59 ml/min (HCC)    Compression fx, thoracic spine (HCC)    T - 11   COPD (chronic obstructive pulmonary disease) ([place]) 12/16/2015   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HCC)     Essential hypertension    GERD (gastroesophageal reflux disease)    HOH (hard of hearing)    Inflammatory arthritis    Iron deficiency anemia 01/2012   Pulmonary eosinophilia (HCC)    Spinal stenosis    Tubular adenoma 11/2012    Past Surgical History:  Procedure Laterality Date   ABDOMINAL HYSTERECTOMY     BACK SURGERY     CARPAL TUNNEL RELEASE Right 4/14   CATARACT EXTRACTION W/PHACO Left 11/19/2014   Procedure: CATARACT EXTRACTION PHACO AND INTRAOCULAR LENS PLACEMENT ([place]);  Surgeon: Williams Che, MD;  Location: [facility];  Service: Ophthalmology;  Laterality: Left;  CDE: 4.77   CATARACT EXTRACTION W/PHACO Right 02/03/2015   Procedure: CATARACT EXTRACTION PHACO AND INTRAOCULAR LENS PLACEMENT ([facility]);  Surgeon: Williams Che, MD;  Location: [facility];  Service: Ophthalmology;  Laterality: Right;  CDE: 4.54   COLONOSCOPY  06/19/2008   RMR: tortuous and elongated colon with scattered left-sided diverticula/colonic mucosa appeared entirely normal. Prior colonic ulcers had resolved.   COLONOSCOPY  12/2007   Dr. Gala Romney: Scattered diffuse sigmoid diverticula, 2 areas of ulceration at the hepatic flexure. Biopsies unremarkable.   COLONOSCOPY N/A 11/20/2012   next TCS 11/2017   COLONOSCOPY WITH PROPOFOL N/A 12/22/2017   Procedure: COLONOSCOPY WITH PROPOFOL;  Surgeon: Daneil Dolin, MD; diverticula in the sigmoid and descending colon and 2 tubular adenomas.  No recommendations to repeat.     DECOMPRESSIVE LUMBAR LAMINECTOMY LEVEL 2  04/20/2012   Procedure: DECOMPRESSIVE LUMBAR LAMINECTOMY LEVEL 2;  Surgeon: Kipp Brood  Gioffre, MD;  Location: [facility];  Service: Orthopedics;  Laterality: Right;  Decompressive Lumbar Laminectomy of the L4 - L5 and L5 - S1 Complete/Laminectomy L5 on the Right (X-Ray)   ESOPHAGOGASTRODUODENOSCOPY  12/2007   Dr. 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 Small hiatal hernia. Slightly pale duodenal mucosa (biopsy unremarkable)    ESOPHAGOGASTRODUODENOSCOPY (EGD) WITH PROPOFOL N/A 01/20/2017   Dr. Gala Romney: Medium sized hiatal hernia empiric esophageal dilation for history of dysphagia   ESOPHAGOGASTRODUODENOSCOPY (EGD) WITH PROPOFOL N/A 04/08/2020   Procedure: ESOPHAGOGASTRODUODENOSCOPY (EGD) WITH PROPOFOL;  Surgeon: Eloise Harman, DO;  [facility];  Service: Endoscopy;  Laterality: N/A;   EYE SURGERY     BIL CATARACTS   FOREIGN BODY REMOVAL N/A 04/08/2020   Procedure: FOREIGN BODY REMOVAL;  Surgeon: Eloise Harman, DO;  [facility];  Service: Endoscopy;  Laterality: N/A;   IR KYPHO THORACIC WITH BONE BIOPSY  02/08/2017   IR RADIOLOGIST EVAL & MGMT  02/02/2017   JOINT REPLACEMENT     KNEE ARTHROSCOPY Right    MALONEY DILATION N/A 01/20/2017   Procedure: Venia Minks DILATION;  Surgeon: Daneil Dolin, MD;  [facility];  Service: Endoscopy;  Laterality: N/A;   POLYPECTOMY  12/22/2017   Procedure: POLYPECTOMY;  Surgeon: Daneil Dolin, MD;  [facility];  Service: Endoscopy;;   REVERSE SHOULDER ARTHROPLASTY Right 05/24/2014   Procedure: RIGHT SHOULDER REVERSE ARTHROPLASTY;  Surgeon: Netta Cedars, MD;  Location: [place];  Service: Orthopedics;  Laterality: Right;   REVERSE SHOULDER ARTHROPLASTY Left 06/10/2017   Procedure: LEFT REVERSE SHOULDER ARTHROPLASTY;  Surgeon: Netta Cedars, MD;  Location: [place];  Service: Orthopedics;  Laterality: Left;   TOTAL KNEE ARTHROPLASTY  01/24/2012   Procedure: TOTAL KNEE ARTHROPLASTY;  Surgeon: Gearlean Alf, MD;  Location: [facility];  Service: Orthopedics;  Laterality: Right;   TOTAL KNEE ARTHROPLASTY Left 10/15/2019   Procedure: TOTAL KNEE ARTHROPLASTY;  Surgeon: Gaynelle Arabian, MD;  Location: [facility];  Service: Orthopedics;  Laterality: Left;  66min   TUBAL LIGATION      Current Medications: Current Meds  Medication Sig   albuterol (PROAIR HFA) 108 (90 Base) MCG/ACT inhaler Inhale 2 puffs into the lungs every 6 (six) hours as needed for  wheezing or shortness of breath (For COPD).    ALPRAZolam (XANAX) 0.5 MG tablet Take 0.5 tablets (0.25 mg total) by mouth 2 (two) times daily as needed for anxiety.   Benralizumab 30 MG/ML SOSY Inject 30 mg into the skin. Once A Day Every 56 Days   denosumab (PROLIA) 60 MG/ML SOSY injection Inject 60 mg into the skin every 6 (six) months.   DULERA 200-5 MCG/ACT AERO Inhale 2 puffs into the lungs 2 (two) times daily.   esomeprazole (NEXIUM) 40 MG capsule Take 1 capsule (40 mg total) by mouth 2 (two) times daily.   ferrous sulfate 325 (65 FE) MG tablet Take 1 tablet (325 mg total) by mouth daily with breakfast.   fexofenadine (ALLEGRA) 180 MG tablet Take 180 mg by mouth daily.   fluticasone (FLONASE) 50 MCG/ACT nasal spray Place 1 spray into both nostrils daily as needed for allergies or rhinitis.    ketotifen (ZADITOR) 0.025 % ophthalmic solution Place 1 drop into both eyes 2 (two) times daily as needed (allergies).   Menthol, Topical Analgesic, (BIOFREEZE) 4 % GEL Apply 1 application topically 2 (two) times daily. Special Instructions: for back pain   montelukast (SINGULAIR) 10 MG  tablet Take 1 tablet (10 mg total) by mouth at bedtime.   olmesartan (BENICAR) 40 MG tablet Take 1 tablet (40 mg total) by mouth daily.   ondansetron (ZOFRAN) 4 MG tablet Take 1 tablet (4 mg total) by mouth every 8 (eight) hours as needed for nausea or vomiting.   Oxycodone HCl 10 MG TABS Take 10 mg by mouth every 6 (six) hours.   Polyethyl Glycol-Propyl Glycol (SYSTANE OP) Place 1 drop into both eyes 3 (three) times daily as needed (dry/irritated eyes.).    potassium chloride (KLOR-CON) 10 MEQ tablet Take 1 tablet (10 mEq total) by mouth every other day. IN THE MORNING   torsemide (DEMADEX) 10 MG tablet Take 1 tablet (10 mg total) by mouth every other day. IN THE MORNING     Allergies:   Flexeril [cyclobenzaprine], Other, Keflex [cephalexin], Aspirin, Statins, Adhesive [tape], and Chlorhexidine   Social History    Tobacco Use   Smoking status: Former Smoker    Packs/day: 2.00    Years: 40.00    Pack years: 80.00    Types: Cigarettes    Quit date: 01/19/1992    Years since quitting: 28.4   Smokeless tobacco: Never Used  Vaping Use   Vaping Use: Never used  Substance Use Topics   Alcohol use: No   Drug use: No     Family History: The patient's family history includes Breast cancer in her sister; Colon cancer in her sister; Heart attack in her mother; Hypertension in her father and mother; Prostate cancer in her father; Stroke in her mother.  ROS:   Please see the history of present illness.    All other systems reviewed and are negative.  EKGs/Labs/Other Studies Reviewed:    The following studies were reviewed today:  EKG:  NSR  I have independently reviewed the images from n/a.  Recent Labs: 10/19/2019: TSH 0.947 04/08/2020: ALT 28; BUN 28; Creatinine, Ser 1.82; Hemoglobin 10.0; Platelets 262; Potassium 4.3; Sodium 134  Recent Lipid Panel No results found for: CHOL, TRIG, HDL, CHOLHDL, VLDL, LDLCALC, LDLDIRECT  Physical Exam:    VS:  BP (!) 160/100   Pulse 89   Ht 5' (1.524 m)   Wt 191 lb 12.8 oz (87 kg)   BMI 37.46 kg/m     Wt Readings from Last 5 Encounters:  06/17/20 191 lb 12.8 oz (87 kg)  04/08/20 189 lb (85.7 kg)  03/19/20 188 lb 3.2 oz (85.4 kg)  02/29/20 170 lb (77.1 kg)  01/14/20 177 lb 9.6 oz (80.6 kg)    Constitutional: No acute distress Eyes: sclera non-icteric, normal conjunctiva and lids ENMT: normal dentition, moist mucous membranes Cardiovascular: regular rhythm, normal rate, no murmurs. S1 and S2 normal. Radial pulses normal bilaterally. No jugular venous distention.  Respiratory: clear to auscultation bilaterally GI : normal bowel sounds, soft and nontender. No distention.   MSK: extremities warm, well perfused. 1+ bilat edema.  NEURO: grossly nonfocal exam, moves all extremities. PSYCH: alert and oriented x 3, normal mood and affect.    ASSESSMENT:    1. Hypertension, unspecified type   2. Swelling of lower extremity   3. Chronic diastolic CHF (congestive heart failure) ([place])   4. Mixed hyperlipidemia   5. Obesity (BMI 35.0-39.9 without comorbidity)   6. Chronic obstructive pulmonary disease, unspecified COPD type ([place])   7. Lower extremity edema    PLAN:    Hypertension, unspecified type - Plan: EKG 12-Lead, ECHOCARDIOGRAM COMPLETE Swelling of lower extremity - Plan: ECHOCARDIOGRAM COMPLETE  Chronic diastolic CHF (congestive heart failure) (HCC) - we need to bring her BP down. Despite LE edema, will give amlodipine to optimize BP. Could also consider doxazosin but would be cautious with potential for dizziness, and will want to avoid anything that can impair renal function as this is being monitored by neph. BB difficult to use due to COPD/asthma.  - need her to take additional torsemide today and tomorrow to optimize volume status and weight. Then continue daily as prescribed.  - will refer to CVRR for continued BP optimization and to see if amlodipine is troublesome for LE edema. I recommend compression stockings to control LE edema which may be multifactorial.  Mixed hyperlipidemia - on pravastatin 10 mg daily, most recent lipids from Nov at OSH show LDL 107, Trig 66, HDL 56. Could uptitrate, will consider at follow up. Needs follow up labs at follow up.   Lower extremity edema - torsemide increased doses for swelling and weight gain as noted above. Compression socks.   Total time of encounter: 45 minutes total time of encounter, including 38 minutes spent in face-to-face patient care on the date of this encounter. This time includes coordination of care and counseling regarding above mentioned problem list. Remainder of non-face-to-face time involved reviewing chart documents/testing relevant to the patient encounter and documentation in the medical record. I have independently reviewed documentation from referring  provider.   Cherlynn Kaiser, MD, [facility]    Medication Adjustments/Labs and Tests Ordered: Current medicines are reviewed at length with the patient today.  Concerns regarding medicines are outlined above.   Orders Placed This Encounter  Procedures   EKG 12-Lead   ECHOCARDIOGRAM COMPLETE    Shared Decision Making/Informed Consent:       Meds ordered this encounter  Medications    amLODipine (NORVASC) 5 MG tablet    Sig: Take 1 tablet (5 mg total) by mouth daily.    Dispense:  30 tablet    Refill:  3    Patient Instructions  Medication Instructions:  START: AMLODIPINE 5mg  (1) TABLET DAILY TAKE AN EXTRA TORSEMIDE 10mg  TOMORROW and THE FOLLOWING DAY  *If you need a refill on your cardiac medications before your next appointment, please call your pharmacy*  Testing/Procedures: Your physician has requested that you have an echocardiogram. Echocardiography is a painless test that uses sound waves to create images of your heart. It provides your doctor with information about the size and shape of your heart and how well your heart's chambers and valves are working. You may receive an ultrasound enhancing agent through an IV if needed to better visualize your heart during the echo.This procedure takes approximately one hour. There are no restrictions for this procedure. This will take place at the [address].   Follow-Up: At [facility], you and your health needs are our priority.  As part of our continuing mission to provide you with exceptional heart care, we have created designated Provider Care Teams.  These Care Teams include your primary Cardiologist (physician) and Advanced Practice Providers (APPs -  Physician Assistants and Nurse Practitioners) who all work together to provide you with the care you need, when you need it.  We recommend signing up for the patient portal called "MyChart".  Sign up information is provided on this  After Visit Summary.  MyChart is used to connect with patients for Virtual Visits (Telemedicine).  Patients are able to view lab/test results, encounter notes, upcoming appointments, etc.  Non-urgent messages can be sent to your provider as well.   To learn more about what you can do with MyChart, go to [email or web address].    Your next appointment:   2-3 WEEKS WITH CVRR- BP CHECK   3 MONTHS WITH Dr. Margaretann Loveless   The format for your next appointment:   In Person  Provider:   Cherlynn Kaiser, MD  Other Instructions WEIGH YOUR SELF DAILY AND WRITE THIS NUMBER DOWN- PLEASE LET us KNOW IF YOUR WEIGHT IS INCREASING OR YOUR LEGS ARE SWELLING.  [place]

## 2020-06-21 DIAGNOSIS — M47896 Other spondylosis, lumbar region: Secondary | ICD-10-CM | POA: Diagnosis not present

## 2020-06-21 DIAGNOSIS — M47816 Spondylosis without myelopathy or radiculopathy, lumbar region: Secondary | ICD-10-CM | POA: Diagnosis not present

## 2020-07-02 DIAGNOSIS — D509 Iron deficiency anemia, unspecified: Secondary | ICD-10-CM | POA: Diagnosis not present

## 2020-07-02 DIAGNOSIS — E1165 Type 2 diabetes mellitus with hyperglycemia: Secondary | ICD-10-CM | POA: Diagnosis not present

## 2020-07-02 DIAGNOSIS — I129 Hypertensive chronic kidney disease with stage 1 through stage 4 chronic kidney disease, or unspecified chronic kidney disease: Secondary | ICD-10-CM | POA: Diagnosis not present

## 2020-07-02 DIAGNOSIS — I10 Essential (primary) hypertension: Secondary | ICD-10-CM | POA: Diagnosis not present

## 2020-07-02 DIAGNOSIS — M1990 Unspecified osteoarthritis, unspecified site: Secondary | ICD-10-CM | POA: Diagnosis not present

## 2020-07-02 DIAGNOSIS — N189 Chronic kidney disease, unspecified: Secondary | ICD-10-CM | POA: Diagnosis not present

## 2020-07-02 DIAGNOSIS — E1122 Type 2 diabetes mellitus with diabetic chronic kidney disease: Secondary | ICD-10-CM | POA: Diagnosis not present

## 2020-07-02 DIAGNOSIS — E782 Mixed hyperlipidemia: Secondary | ICD-10-CM | POA: Diagnosis not present

## 2020-07-09 DIAGNOSIS — J45909 Unspecified asthma, uncomplicated: Secondary | ICD-10-CM | POA: Diagnosis not present

## 2020-07-09 DIAGNOSIS — M797 Fibromyalgia: Secondary | ICD-10-CM | POA: Diagnosis not present

## 2020-07-09 DIAGNOSIS — K219 Gastro-esophageal reflux disease without esophagitis: Secondary | ICD-10-CM | POA: Diagnosis not present

## 2020-07-09 DIAGNOSIS — Z712 Person consulting for explanation of examination or test findings: Secondary | ICD-10-CM | POA: Diagnosis not present

## 2020-07-09 DIAGNOSIS — E1122 Type 2 diabetes mellitus with diabetic chronic kidney disease: Secondary | ICD-10-CM | POA: Diagnosis not present

## 2020-07-09 DIAGNOSIS — M8580 Other specified disorders of bone density and structure, unspecified site: Secondary | ICD-10-CM | POA: Diagnosis not present

## 2020-07-09 DIAGNOSIS — F411 Generalized anxiety disorder: Secondary | ICD-10-CM | POA: Diagnosis not present

## 2020-07-09 DIAGNOSIS — D509 Iron deficiency anemia, unspecified: Secondary | ICD-10-CM | POA: Diagnosis not present

## 2020-07-09 DIAGNOSIS — E875 Hyperkalemia: Secondary | ICD-10-CM | POA: Diagnosis not present

## 2020-07-09 DIAGNOSIS — I10 Essential (primary) hypertension: Secondary | ICD-10-CM | POA: Diagnosis not present

## 2020-07-09 DIAGNOSIS — M1990 Unspecified osteoarthritis, unspecified site: Secondary | ICD-10-CM | POA: Diagnosis not present

## 2020-07-13 DIAGNOSIS — M1990 Unspecified osteoarthritis, unspecified site: Secondary | ICD-10-CM | POA: Diagnosis not present

## 2020-07-13 DIAGNOSIS — E1165 Type 2 diabetes mellitus with hyperglycemia: Secondary | ICD-10-CM | POA: Diagnosis not present

## 2020-07-16 DIAGNOSIS — G894 Chronic pain syndrome: Secondary | ICD-10-CM | POA: Diagnosis not present

## 2020-07-18 ENCOUNTER — Telehealth: Payer: Self-pay | Admitting: Internal Medicine

## 2020-07-18 DIAGNOSIS — Z803 Family history of malignant neoplasm of breast: Secondary | ICD-10-CM | POA: Diagnosis not present

## 2020-07-18 DIAGNOSIS — M25511 Pain in right shoulder: Secondary | ICD-10-CM | POA: Diagnosis not present

## 2020-07-18 DIAGNOSIS — Z7952 Long term (current) use of systemic steroids: Secondary | ICD-10-CM | POA: Diagnosis not present

## 2020-07-18 DIAGNOSIS — J4551 Severe persistent asthma with (acute) exacerbation: Secondary | ICD-10-CM | POA: Diagnosis not present

## 2020-07-18 DIAGNOSIS — J441 Chronic obstructive pulmonary disease with (acute) exacerbation: Secondary | ICD-10-CM | POA: Diagnosis not present

## 2020-07-18 DIAGNOSIS — Z79899 Other long term (current) drug therapy: Secondary | ICD-10-CM | POA: Diagnosis not present

## 2020-07-18 DIAGNOSIS — M13819 Other specified arthritis, unspecified shoulder: Secondary | ICD-10-CM | POA: Diagnosis not present

## 2020-07-18 DIAGNOSIS — J449 Chronic obstructive pulmonary disease, unspecified: Secondary | ICD-10-CM | POA: Diagnosis not present

## 2020-07-18 DIAGNOSIS — J8283 Eosinophilic asthma: Secondary | ICD-10-CM | POA: Diagnosis not present

## 2020-07-18 DIAGNOSIS — J301 Allergic rhinitis due to pollen: Secondary | ICD-10-CM | POA: Diagnosis not present

## 2020-07-18 DIAGNOSIS — Z7951 Long term (current) use of inhaled steroids: Secondary | ICD-10-CM | POA: Diagnosis not present

## 2020-07-18 DIAGNOSIS — Z87891 Personal history of nicotine dependence: Secondary | ICD-10-CM | POA: Diagnosis not present

## 2020-07-18 NOTE — Telephone Encounter (Signed)
Pt c/o medication issue:  1. Name of Medication: amLODipine (NORVASC) 5 MG tablet  2. How are you currently taking this medication (dosage and times per day)? As directed  3. Are you having a reaction (difficulty breathing--STAT)? no  4. What is your medication issue? Per daughter, she believes this medication is causing swelling in her mothers hands and feet.   Pt c/o swelling: STAT is pt has developed SOB within 24 hours  1) How much weight have you gained and in what time span? Not sure, spoke with daughter and mother on the phone. Per mother, Dr. Laurance Flatten, a pulmonologist whom she saw today said that she had 20 pounds of fluid on her.   2) If swelling, where is the swelling located? Hands and feet  3) Are you currently taking a fluid pill? no  4) Are you currently SOB? no  5) Do you have a log of your daily weights (if so, list)? no  6) Have you gained 3 pounds in a day or 5 pounds in a week? unsure  7) Have you traveled recently? No   Patient denies any symptoms, BP is good 135/80 at appt with Dr. Laurance Flatten today.

## 2020-07-18 NOTE — Telephone Encounter (Signed)
Spoke with pt and she asked that I speak to hear daughter as she is [facility] Per pt was up 3 lbs today so has taken an extra Torsemide Pt's B/P have been running 140/80 and 145/80 Pt is wearing the compression stockings as directed Encouraged pt to also watch salt intake as well.Will forward to Dr Margaretann Loveless for review./cy

## 2020-07-21 ENCOUNTER — Telehealth: Payer: Self-pay | Admitting: Internal Medicine

## 2020-07-21 NOTE — Telephone Encounter (Signed)
Spoke to patient;s [name]. She states her sister  Notice yesterday that the patient legs were swollen and discolored a little. - RN asked about what time of the day  Assume patient was not wearing compression hose. Glenda Terry states about 1 pm , and no she did not have them on.  RN asked about weight. Per Glenda Terry weight was 190 ib Today , did not take check weight yersterday but on Friday weight was 193 lbs. Blood pressure today 128/82.    Daugther had question  In regards to  Patient taking Demadex  per daughter, patient saw her kidney doctor - he informed patient to take  2 tablets of 10 mg  Demadex every other day but her medication bottle stated 2 tablets of 10 mg daily.  So this what she has been doing. This information was all done prior to the appointment with Dr Margaretann Loveless on 06/17/20.   per Dr Margaretann Loveless notes  06/17/20 patient was to take Demadex 20 mg ( 2 tablets of 10 mg) daily but  An extra dose on 06/17/20 and 06/18/20.  daughter wanted to know if patient should taking medication every day or every other day.   RN informed daugther to have patient to wear support hose and to keep appointment on 07/25/20 with [facility]    ( have someone with patient, bring readings of blood pressure , weight , and bring all medication in there bottles to the appointment.)  Will send message to Dr Margaretann Loveless and  [facility] pharmacist. Daughter voiced understanding

## 2020-07-21 NOTE — Telephone Encounter (Signed)
Note from Nephrologist, dr Alba Cory and triage reviewed.  Looks like patient frequently get confused about diuretics instructions. Note hx of CKD, proteinuria, AKI, and heart failure.   Nephrologist instructions on 05/21/2020: "Patient was supposed to be taking torsemide 20 mg p.o. daily but was taking 20 mg p.o. every other day. Asked patient to please take daily"   Recommendations:  1. Take torsemide 20mg  every day (2 tablets of 10mg ). 2. Okay to take an extra 10mg  dose of torsemide if she gains more than 5lb 3. Decrease dietary sodium and salt as much as possible 4. Ask recommended by RN - wear support hose daily 5. Check morning weight every day at the same time and keep records 6.Bring all medication (including supplements and over the counter medication) to appointment with pharmacist 7.Brindg records for weight and blood pressure readings 8. Bring home BP device

## 2020-07-21 NOTE — Telephone Encounter (Signed)
Pt c/o swelling: STAT is pt has developed SOB within 24 hours  1) How much weight have you gained and in what time span? No   2) If swelling, where is the swelling located? Both legs  3) Are you currently taking a fluid pill? Yes   4) Are you currently SOB? No   5) Do you have a log of your daily weights (if so, list)? No   6) Have you gained 3 pounds in a day or 5 pounds in a week? Not sure  7) Have you traveled recently? No

## 2020-07-21 NOTE — Telephone Encounter (Signed)
Called and gave the recommendation from raquel and the pt voiced understanding

## 2020-07-24 DIAGNOSIS — N170 Acute kidney failure with tubular necrosis: Secondary | ICD-10-CM | POA: Diagnosis not present

## 2020-07-24 DIAGNOSIS — D638 Anemia in other chronic diseases classified elsewhere: Secondary | ICD-10-CM | POA: Diagnosis not present

## 2020-07-24 DIAGNOSIS — I129 Hypertensive chronic kidney disease with stage 1 through stage 4 chronic kidney disease, or unspecified chronic kidney disease: Secondary | ICD-10-CM | POA: Diagnosis not present

## 2020-07-24 DIAGNOSIS — N1832 Chronic kidney disease, stage 3b: Secondary | ICD-10-CM | POA: Diagnosis not present

## 2020-07-25 ENCOUNTER — Ambulatory Visit (INDEPENDENT_AMBULATORY_CARE_PROVIDER_SITE_OTHER): Payer: PPO | Admitting: Pharmacist

## 2020-07-25 ENCOUNTER — Other Ambulatory Visit: Payer: Self-pay

## 2020-07-25 VITALS — BP 146/78 | HR 109 | Ht 59.0 in | Wt 193.6 lb

## 2020-07-25 DIAGNOSIS — I10 Essential (primary) hypertension: Secondary | ICD-10-CM | POA: Diagnosis not present

## 2020-07-25 MED ORDER — MONTELUKAST SODIUM 10 MG PO TABS: 10.0000 mg | ORAL_TABLET | Freq: Every day | ORAL | 2 refills | Status: AC

## 2020-07-25 NOTE — Patient Instructions (Addendum)
Return for a  follow up appointment in 6-8 weeks  Check your blood pressure at home daily (if able) and keep record of the readings.  Take your BP meds as follows: *NO CHANGE TODAY*  *CONTINUE LOW SODIUM DIET*  Bring all of your meds, your BP cuff and your record of home blood pressures to your next appointment.  Exercise as you're able, try to walk approximately 30 minutes per day.  Keep salt intake to a minimum, especially watch canned and prepared boxed foods.  Eat more fresh fruits and vegetables and fewer canned items.  Avoid eating in fast food restaurants.    HOW TO TAKE YOUR BLOOD PRESSURE: . Rest 5 minutes before taking your blood pressure. .  Don't smoke or drink caffeinated beverages for at least 30 minutes before. . Take your blood pressure before (not after) you eat. . Sit comfortably with your back supported and both feet on the floor (don't cross your legs). . Elevate your arm to heart level on a table or a desk. . Use the proper sized cuff. It should fit smoothly and snugly around your bare upper arm. There should be enough room to slip a fingertip under the cuff. The bottom edge of the cuff should be 1 inch above the crease of the elbow. . Ideally, take 3 measurements at one sitting and record the average.

## 2020-07-25 NOTE — Progress Notes (Signed)
Patient ID: JERELENE SALAAM                 DOB: July 08, 1940                      MRN: [id number]     HPI: JAYLEI FUERTE is a 80 y.o. female referred by Dr. Margaretann Loveless to HTN clinic. PMH includes asthma, CKD, Cushing's syndrome, diastolic HF, hypertension, chronic pain syndrome, pulmonary eosinophilia, and tubular adenoma. Noted follow up with nephrologist d/t acute on chronic kidney disease. Per nephrologist note her creatinine increased due to decrease intake and continuing to take RAS blocker. Olmesartan on HOLD for now.  Current HTN meds:  Amlodipine 5mg  daily - added by Dr Margaretann Loveless on 06/17/2020 Olmesartan 40mg  daily - dc by nephrologist  AKI. Torsemide 20mg  daily and additional 10mg  if wt up 5lbs  BP goal: 130/80  Family History: family history includes Breast cancer in her sister; Colon cancer in her sister; Heart attack in her mother; Hypertension in her father and mother; Prostate cancer in her father; Stroke in her mother.  Social History: former smoker, denies alcohol use  Diet: avoid sodium, uses [employer] to season her food  Exercise: activities of daily living  Home BP readings:  Wrist cuff - (good technique) - accurate within 95mm Hg from manual  26 measurements, average 136/79, HR range 77-113 bpm  Wt Readings from Last 3 Encounters:  07/25/20 193 lb 9.6 oz (87.8 kg)  06/17/20 191 lb 12.8 oz (87 kg)  04/08/20 189 lb (85.7 kg)   BP Readings from Last 3 Encounters:  07/25/20 (!) 146/78  06/17/20 (!) 160/100  04/08/20 93/73   Pulse Readings from Last 3 Encounters:  07/25/20 (!) 109  06/17/20 89  04/08/20 86    Renal function: CrCl cannot be calculated (Patient's most recent lab result is older than the maximum 21 days allowed.).  Past Medical History:  Diagnosis Date  . Anxiety   . Asthma   . Back pain, chronic   . CKD (chronic kidney disease) stage 3, GFR 30-59 ml/min (HCC)   . Compression fx, thoracic spine (HCC)    T - 11  . COPD (chronic obstructive  pulmonary disease) ([place]) 12/16/2015  . 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[place])   . Essential hypertension   . GERD (gastroesophageal reflux disease)   . HOH (hard of hearing)   . Inflammatory arthritis   . Iron deficiency anemia 01/2012  . Pulmonary eosinophilia ([place])   . Spinal stenosis   . Tubular adenoma 11/2012    Current Outpatient Medications on File Prior to Visit  Medication Sig Dispense Refill  . Acetaminophen (APAP 500 PO) Take 2 tablets by mouth 2 (two) times daily as needed.    Marland Kitchen albuterol (PROAIR HFA) 108 (90 Base) MCG/ACT inhaler Inhale 2 puffs into the lungs every 6 (six) hours as needed for wheezing or shortness of breath (For COPD).     Marland Kitchen ALPRAZolam (XANAX) 0.5 MG tablet Take 0.5 tablets (0.25 mg total) by mouth 2 (two) times daily as needed for anxiety. 10 tablet 0  . amLODipine (NORVASC) 5 MG tablet Take 1 tablet (5 mg total) by mouth daily. 30 tablet 3  . Benralizumab 30 MG/ML SOSY Inject 30 mg into the skin. Once A Day Every 56 Days    . bisacodyl (DULCOLAX) 5 MG EC tablet Take 5 mg by mouth daily as needed for moderate constipation.    . calcium carbonate (CALCIUM 600) 600 MG TABS  tablet Take 600 mg by mouth 2 (two) times daily with a meal.    . Cholecalciferol (VITAMIN D3) 50 MCG (2000 UT) CAPS Take 1 capsule by mouth daily.    Marland Kitchen denosumab (PROLIA) 60 MG/ML SOSY injection Inject 60 mg into the skin every 6 (six) months.    . diphenhydrAMINE (BENADRYL) 25 mg capsule Take 25 mg by mouth at bedtime.    . DULERA 200-5 MCG/ACT AERO Inhale 2 puffs into the lungs 2 (two) times daily. 8.8 g 0  . esomeprazole (NEXIUM) 40 MG capsule Take 1 capsule (40 mg total) by mouth 2 (two) times daily. 60 capsule 0  . ferrous sulfate 325 (65 FE) MG tablet Take 1 tablet (325 mg total) by mouth daily with breakfast. 30 tablet 0  . fexofenadine (ALLEGRA) 180 MG tablet Take 180 mg by mouth daily.    . fluticasone (FLONASE) 50 MCG/ACT nasal spray Place 1 spray into both nostrils daily as needed for allergies  or rhinitis.     Marland Kitchen ipratropium-albuterol (DUONEB) 0.5-2.5 (3) MG/3ML SOLN Take 3 mLs by nebulization every 4 (four) hours as needed.    Marland Kitchen ketotifen (ZADITOR) 0.025 % ophthalmic solution Place 1 drop into both eyes 2 (two) times daily as needed (allergies). 5 mL 0  . Magnesium 250 MG TABS Take 250 mg by mouth daily.    . Menthol, Topical Analgesic, (BIOFREEZE) 4 % GEL Apply 1 application topically 2 (two) times daily. Special Instructions: for back pain    . Oxycodone HCl 10 MG TABS Take 10 mg by mouth every 6 (six) hours.    Vladimir Faster Glycol-Propyl Glycol (SYSTANE OP) Place 1 drop into both eyes 3 (three) times daily as needed (dry/irritated eyes.).     Marland Kitchen potassium chloride (KLOR-CON) 10 MEQ tablet Take 1 tablet (10 mEq total) by mouth every other day. IN THE MORNING 15 tablet 0  . pravastatin (PRAVACHOL) 10 MG tablet Take 10 mg by mouth daily.    Marland Kitchen torsemide (DEMADEX) 10 MG tablet Take 20 mg by mouth daily.    . vitamin C (ASCORBIC ACID) 500 MG tablet Take 500 mg by mouth daily.     No current facility-administered medications on file prior to visit.    Allergies  Allergen Reactions  . Flexeril [Cyclobenzaprine] Anaphylaxis  . Other Anaphylaxis and Other (See Comments)    ALL TREE NUTS  . Keflex [Cephalexin] Itching  . Aspirin Other (See Comments)    Cannot take due to ulcers  . Statins Swelling and Other (See Comments)    Pain  . Adhesive [Tape] Rash  . Chlorhexidine Rash    Blood pressure (!) 146/78, pulse (!) 109, height 4\' 11"  (1.499 m), weight 193 lb 9.6 oz (87.8 kg), SpO2 99 %.  HTN (hypertension) Blood pressure slightly above goal during office visit, but patient coughing d/t asthma exacerbation. Had to use her rescue inhaler while in clinic. Noted her BP cuff is accurate when compared with manual reading and patient uses proper technique as well.   Medication reconciliation was completed during this appointment. Patient is NOT taking olmesartan as listed in the medication  profile. Medication was [placed on HOLD after recent acute on chronic kidney injury. She is currently on amlodipine 5mg  daily and torsemide 20mg  daily. Patient also using compression stocking to control LEE.  Noted amlodipine titration is not appropriate d/t to LEE, olmesartan currently on HOLD per nephrologist recommendation until Scr stable, severe asthma limits use of beta-blockers, and patient already on loop diuretic.  Plan to avoid alpha-blockers for now to decrease potential for dizziness on patient already using cane to ambulate. Will continue current medication as prescribed, and working to increase physical activity as much as possible.   Plan to transition for amlodipine to diltiazem during next OV if HR remains elevated and BP needs additional control. Hydralazine 25mg  twice daily may be another alternative for BP management while nephrologist okay to resume olmesartan.   Quandarius Nill Rodriguez-Guzman PharmD, BCPS, [facility] [address] [place] [address] 07/28/2020 2:10 PM

## 2020-07-28 ENCOUNTER — Encounter: Payer: Self-pay | Admitting: Pharmacist

## 2020-07-28 NOTE — Assessment & Plan Note (Addendum)
Blood pressure slightly above goal during office visit, but patient coughing d/t asthma exacerbation. Had to use her rescue inhaler while in clinic. Noted her BP cuff is accurate when compared with manual reading and patient uses proper technique as well.   Medication reconciliation was completed during this appointment. Patient is NOT taking olmesartan as listed in the medication profile. Medication was [placed on HOLD after recent acute on chronic kidney injury. She is currently on amlodipine 5mg  daily and torsemide 20mg  daily. Patient also using compression stocking to control LEE.  Noted amlodipine titration is not appropriate d/t to LEE, olmesartan currently on HOLD per nephrologist recommendation until Scr stable, severe asthma limits use of beta-blockers, and patient already on loop diuretic. Plan to avoid alpha-blockers for now to decrease potential for dizziness on patient already using cane to ambulate. Will continue current medication as prescribed, and working to increase physical activity as much as possible.   Plan to transition for amlodipine to diltiazem during next OV if HR remains elevated and BP needs additional control. Hydralazine 25mg  twice daily may be another alternative for BP management while nephrologist okay to resume olmesartan.

## 2020-07-31 DIAGNOSIS — E211 Secondary hyperparathyroidism, not elsewhere classified: Secondary | ICD-10-CM | POA: Diagnosis not present

## 2020-07-31 DIAGNOSIS — I129 Hypertensive chronic kidney disease with stage 1 through stage 4 chronic kidney disease, or unspecified chronic kidney disease: Secondary | ICD-10-CM | POA: Diagnosis not present

## 2020-07-31 DIAGNOSIS — N1832 Chronic kidney disease, stage 3b: Secondary | ICD-10-CM | POA: Diagnosis not present

## 2020-07-31 DIAGNOSIS — I5032 Chronic diastolic (congestive) heart failure: Secondary | ICD-10-CM | POA: Diagnosis not present

## 2020-08-01 ENCOUNTER — Other Ambulatory Visit: Payer: Self-pay

## 2020-08-01 ENCOUNTER — Ambulatory Visit (HOSPITAL_COMMUNITY): Payer: PPO | Attending: Internal Medicine

## 2020-08-01 ENCOUNTER — Ambulatory Visit: Payer: PPO

## 2020-08-01 DIAGNOSIS — I10 Essential (primary) hypertension: Secondary | ICD-10-CM | POA: Diagnosis not present

## 2020-08-01 DIAGNOSIS — M7989 Other specified soft tissue disorders: Secondary | ICD-10-CM | POA: Insufficient documentation

## 2020-08-01 LAB — ECHOCARDIOGRAM COMPLETE
Area-P 1/2: 2.87 cm2
S' Lateral: 3.3 cm

## 2020-08-05 ENCOUNTER — Other Ambulatory Visit (HOSPITAL_COMMUNITY): Payer: Self-pay | Admitting: Internal Medicine

## 2020-08-05 DIAGNOSIS — Z1231 Encounter for screening mammogram for malignant neoplasm of breast: Secondary | ICD-10-CM

## 2020-08-07 ENCOUNTER — Telehealth: Payer: Self-pay | Admitting: Internal Medicine

## 2020-08-07 NOTE — Telephone Encounter (Signed)
Follow Up:      Daughter is calling to see if pt's Echo results are ready from 07-25-20?`

## 2020-08-07 NOTE — Telephone Encounter (Signed)
Informed pt that Dr Margaretann Loveless has not read report yet we will call her as soon as she does

## 2020-08-14 ENCOUNTER — Ambulatory Visit (HOSPITAL_COMMUNITY): Payer: PPO

## 2020-08-21 ENCOUNTER — Telehealth: Payer: Self-pay | Admitting: Internal Medicine

## 2020-08-21 ENCOUNTER — Ambulatory Visit (HOSPITAL_COMMUNITY)
Admission: RE | Admit: 2020-08-21 | Discharge: 2020-08-21 | Disposition: A | Discharge status: Home / Self Care | Payer: PPO | Source: Ambulatory Visit | Attending: Internal Medicine | Admitting: Internal Medicine

## 2020-08-21 DIAGNOSIS — Z1231 Encounter for screening mammogram for malignant neoplasm of breast: Secondary | ICD-10-CM

## 2020-08-21 NOTE — Telephone Encounter (Signed)
PT's daughter is calling to find out the results of her mother's ECHO

## 2020-08-21 NOTE — Telephone Encounter (Signed)
Spoke with patient and shared the results of her echo, no change since 2017. Patient appreciative of phone call.

## 2020-09-02 ENCOUNTER — Other Ambulatory Visit: Payer: Self-pay

## 2020-09-02 ENCOUNTER — Encounter (HOSPITAL_COMMUNITY)
Admission: RE | Admit: 2020-09-02 | Discharge: 2020-09-02 | Disposition: A | Discharge status: Home / Self Care | Payer: PPO | Source: Ambulatory Visit | Attending: Internal Medicine | Admitting: Internal Medicine

## 2020-09-02 DIAGNOSIS — M810 Age-related osteoporosis without current pathological fracture: Secondary | ICD-10-CM | POA: Diagnosis not present

## 2020-09-02 MED ORDER — DENOSUMAB 60 MG/ML ~~LOC~~ SOSY
60.0000 mg | PREFILLED_SYRINGE | Freq: Once | SUBCUTANEOUS | Status: AC
  Administered 2020-09-02: 60 mg via SUBCUTANEOUS

## 2020-09-02 MED ORDER — DENOSUMAB 60 MG/ML ~~LOC~~ SOSY
PREFILLED_SYRINGE | SUBCUTANEOUS | Status: AC
  Filled 2020-09-02: qty 1

## 2020-09-08 DIAGNOSIS — M1990 Unspecified osteoarthritis, unspecified site: Secondary | ICD-10-CM | POA: Diagnosis not present

## 2020-09-08 DIAGNOSIS — E1165 Type 2 diabetes mellitus with hyperglycemia: Secondary | ICD-10-CM | POA: Diagnosis not present

## 2020-09-09 DIAGNOSIS — M25562 Pain in left knee: Secondary | ICD-10-CM | POA: Diagnosis not present

## 2020-09-12 DIAGNOSIS — D7219 Other eosinophilia: Secondary | ICD-10-CM | POA: Diagnosis not present

## 2020-09-12 DIAGNOSIS — J4550 Severe persistent asthma, uncomplicated: Secondary | ICD-10-CM | POA: Diagnosis not present

## 2020-09-17 NOTE — Progress Notes (Deleted)
Referring Provider: Celene Squibb, MD Primary Care Physician:  Celene Squibb, MD Primary GI Physician: Dr. Gala Romney  No chief complaint on file.   HPI:   Glenda Terry is a 80 y.o. female with history of GERD, dysphagia, constipation likely influenced by chronic opioid use, adenomatous colon polyps with last colonoscopy in 2019 revealing diverticula in the sigmoid and descending colon and 2 tubular adenomas.  No recommendations to repeat.  Also with acute, uncomplicated sigmoid diverticulitis in August 2021 and declined follow-up colonoscopy. Developed nausea/vomiting and early satiety starting the latter part of 2021 that resolved with better management of constipation.  GES normal in November 2021.  She is presenting today for follow-up.  Last seen in our office 03/19/2020.  Constipation much improved with Movantik 25 mg daily.  Occasional lower abdominal cramping prior to BM that improves or after.  No alarm symptoms.  GERD well controlled on Nexium 40 mg twice daily.  Nausea/vomiting resolved with better management of constipation.  She had gained 11 pounds in the last couple of months.  States she is monitor eating healthier at the first of the year.  Plan to continue her current medications and follow-up in 6 months.  In the interim, patient presented to the emergency room 04/08/2020 with acute epigastric pain.  She had a CT scan which showed a linear 3-3.5 cm density lodged transversely in the distal stomach mostly resembling a chicken bone.  No evidence of perforation.  Also superimposed chronic hiatal hernia with approximately 50% intrathoracic stomach.  She underwent EGD revealing medium size hiatal hernia 3 to 4 cm chicken bone lodged in the gastric antrum just proximal to the pylorus removed with a snare with mild bleeding at the site of lodged bone.  Normal examined duodenum.  Today: GERD:   Constipation:   Past Medical History:  Diagnosis Date   Anxiety    Asthma    Back  pain, chronic    CKD (chronic kidney disease) stage 3, GFR 30-59 ml/min (HCC)    Compression fx, thoracic spine (HCC)    T - 11   COPD (chronic obstructive pulmonary disease) ([place]) 12/16/2015   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HCC)    Essential hypertension    GERD (gastroesophageal reflux disease)    HOH (hard of hearing)    Inflammatory arthritis    Iron deficiency anemia 01/2012   Pulmonary eosinophilia (HCC)    Spinal stenosis    Tubular adenoma 11/2012    Past Surgical History:  Procedure Laterality Date   ABDOMINAL HYSTERECTOMY     BACK SURGERY     CARPAL TUNNEL RELEASE Right 06/2012   CATARACT EXTRACTION W/PHACO Left 11/19/2014   Procedure: CATARACT EXTRACTION PHACO AND INTRAOCULAR LENS PLACEMENT ([place]);  Surgeon: Williams Che, MD;  Location: [facility];  Service: Ophthalmology;  Laterality: Left;  CDE: 4.77   CATARACT EXTRACTION W/PHACO Right 02/03/2015   Procedure: CATARACT EXTRACTION PHACO AND INTRAOCULAR LENS PLACEMENT ([facility]);  Surgeon: Williams Che, MD;  Location: [facility];  Service: Ophthalmology;  Laterality: Right;  CDE: 4.54   COLONOSCOPY  06/19/2008   RMR: tortuous and elongated colon with scattered left-sided diverticula/colonic mucosa appeared entirely normal. Prior colonic ulcers had resolved.   COLONOSCOPY  12/2007   Dr. Gala Romney: Scattered diffuse sigmoid diverticula, 2 areas of ulceration at the hepatic flexure. Biopsies unremarkable.   COLONOSCOPY N/A 11/20/2012   next TCS 11/2017   COLONOSCOPY WITH PROPOFOL N/A 12/22/2017   Procedure: COLONOSCOPY WITH PROPOFOL;  Surgeon: Manus Rudd  M, MD; diverticula in the sigmoid and descending colon and 2 tubular adenomas.  No recommendations to repeat.     DECOMPRESSIVE LUMBAR LAMINECTOMY LEVEL 2  04/20/2012   Procedure: DECOMPRESSIVE LUMBAR LAMINECTOMY LEVEL 2;  Surgeon: Tobi Bastos, MD;  Location: [facility];  Service: Orthopedics;  Laterality: Right;  Decompressive Lumbar Laminectomy of the L4 - L5 and L5 - S1 Complete/Laminectomy  L5 on the Right (X-Ray)   ESOPHAGOGASTRODUODENOSCOPY  12/2007   Dr. 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 Small hiatal hernia. Slightly pale duodenal mucosa (biopsy unremarkable)   ESOPHAGOGASTRODUODENOSCOPY (EGD) WITH PROPOFOL N/A 01/20/2017   Dr. Gala Romney: Medium sized hiatal hernia empiric esophageal dilation for history of dysphagia   ESOPHAGOGASTRODUODENOSCOPY (EGD) WITH PROPOFOL N/A 04/08/2020   Surgeon: Eloise Harman, DO;  medium size hiatal hernia 3 to 4 cm chicken bone lodged in the gastric antrum just proximal to the pylorus removed with a snare with mild bleeding at the site of lodged bone.  Normal examined duodenum.   EYE SURGERY     BIL CATARACTS   FOREIGN BODY REMOVAL N/A 04/08/2020   Procedure: FOREIGN BODY REMOVAL;  Surgeon: Eloise Harman, DO;  [facility]: [facility];  Service: Endoscopy;  Laterality: N/A;   IR KYPHO THORACIC WITH BONE BIOPSY  02/08/2017   IR RADIOLOGIST EVAL & MGMT  02/02/2017   JOINT REPLACEMENT     KNEE ARTHROSCOPY Right    MALONEY DILATION N/A 01/20/2017   Procedure: MALONEY DILATION;  Surgeon: Daneil Dolin, MD;  Location: [facility];  Service: Endoscopy;  Laterality: N/A;   POLYPECTOMY  12/22/2017   Procedure: POLYPECTOMY;  Surgeon: Daneil Dolin, MD;  Location: [facility];  Service: Endoscopy;;   REVERSE SHOULDER ARTHROPLASTY Right 05/24/2014   Procedure: RIGHT SHOULDER REVERSE ARTHROPLASTY;  Surgeon: Netta Cedars, MD;  Location: [facility];  Service: Orthopedics;  Laterality: Right;   REVERSE SHOULDER ARTHROPLASTY Left 06/10/2017   Procedure: LEFT REVERSE SHOULDER ARTHROPLASTY;  Surgeon: Netta Cedars, MD;  Location: [facility];  Service: Orthopedics;  Laterality: Left;   TOTAL KNEE ARTHROPLASTY  01/24/2012   Procedure: TOTAL KNEE ARTHROPLASTY;  Surgeon: Gearlean Alf, MD;  Location: [facility];  Service: Orthopedics;  Laterality: Right;   TOTAL KNEE ARTHROPLASTY Left 10/15/2019   Procedure:  TOTAL KNEE ARTHROPLASTY;  Surgeon: Gaynelle Arabian, MD;  Location: [facility];  Service: Orthopedics;  Laterality: Left;  70min   TUBAL LIGATION      Current Outpatient Medications  Medication Sig Dispense Refill   Acetaminophen (APAP 500 PO) Take 2 tablets by mouth 2 (two) times daily as needed.     albuterol (PROAIR HFA) 108 (90 Base) MCG/ACT inhaler Inhale 2 puffs into the lungs every 6 (six) hours as needed for wheezing or shortness of breath (For COPD).      ALPRAZolam (XANAX) 0.5 MG tablet Take 0.5 tablets (0.25 mg total) by mouth 2 (two) times daily as needed for anxiety. 10 tablet 0   amLODipine (NORVASC) 5 MG tablet Take 1 tablet (5 mg total) by mouth daily. 30 tablet 3   Benralizumab 30 MG/ML SOSY Inject 30 mg into the skin. Once A Day Every 56 Days     bisacodyl (DULCOLAX) 5 MG EC tablet Take 5 mg by mouth daily as needed for moderate constipation.     calcium carbonate (CALCIUM 600) 600 MG TABS tablet Take 600 mg by mouth 2 (two) times daily with a meal.     Cholecalciferol (VITAMIN  D3) 50 MCG (2000 UT) CAPS Take 1 capsule by mouth daily.     denosumab (PROLIA) 60 MG/ML SOSY injection Inject 60 mg into the skin every 6 (six) months.     diphenhydrAMINE (BENADRYL) 25 mg capsule Take 25 mg by mouth at bedtime.     DULERA 200-5 MCG/ACT AERO Inhale 2 puffs into the lungs 2 (two) times daily. 8.8 g 0   esomeprazole (NEXIUM) 40 MG capsule Take 1 capsule (40 mg total) by mouth 2 (two) times daily. 60 capsule 0   ferrous sulfate 325 (65 FE) MG tablet Take 1 tablet (325 mg total) by mouth daily with breakfast. 30 tablet 0   fexofenadine (ALLEGRA) 180 MG tablet Take 180 mg by mouth daily.     fluticasone (FLONASE) 50 MCG/ACT nasal spray Place 1 spray into both nostrils daily as needed for allergies or rhinitis.      ipratropium-albuterol (DUONEB) 0.5-2.5 (3) MG/3ML SOLN Take 3 mLs by nebulization every 4 (four) hours as needed.     ketotifen (ZADITOR) 0.025 % ophthalmic solution Place 1 drop into  both eyes 2 (two) times daily as needed (allergies). 5 mL 0   Magnesium 250 MG TABS Take 250 mg by mouth daily.     Menthol, Topical Analgesic, (BIOFREEZE) 4 % GEL Apply 1 application topically 2 (two) times daily. Special Instructions: for back pain     montelukast (SINGULAIR) 10 MG tablet Take 1 tablet (10 mg total) by mouth at bedtime. 90 tablet 2   Oxycodone HCl 10 MG TABS Take 10 mg by mouth every 6 (six) hours.     Polyethyl Glycol-Propyl Glycol (SYSTANE OP) Place 1 drop into both eyes 3 (three) times daily as needed (dry/irritated eyes.).      potassium chloride (KLOR-CON) 10 MEQ tablet Take 1 tablet (10 mEq total) by mouth every other day. IN THE MORNING 15 tablet 0   pravastatin (PRAVACHOL) 10 MG tablet Take 10 mg by mouth daily.     torsemide (DEMADEX) 10 MG tablet Take 20 mg by mouth daily.     vitamin C (ASCORBIC ACID) 500 MG tablet Take 500 mg by mouth daily.     No current facility-administered medications for this visit.    Allergies as of 09/18/2020 - Review Complete 07/28/2020  Allergen Reaction Noted   Flexeril [cyclobenzaprine] Anaphylaxis 01/12/2012   Other Anaphylaxis and Other (See Comments) 02/08/2011   Keflex [cephalexin] Itching 05/26/2012   Aspirin Other (See Comments) 05/05/2015   Statins Swelling and Other (See Comments) 05/17/2011   Adhesive [tape] Rash 11/01/2014   Chlorhexidine Rash 11/19/2014    Family History  Problem Relation Age of Onset   Breast cancer Sister    Colon cancer Sister        two deceased, one living and terminal, one current undergoing treatment, ages 76, 79, 27, 59   Hypertension Mother    Stroke Mother    Heart attack Mother    Hypertension Father    Prostate cancer Father     Social History   Socioeconomic History   Marital status: Single    Spouse name: Not on file   Number of children: 3   Years of education: Not on file   Highest education level: Not on file  Occupational History   Not on file  Tobacco Use    Smoking status: Former    Packs/day: 2.00    Years: 40.00    Pack years: 80.00    Types: Cigarettes    Quit  date: 01/19/1992    Years since quitting: 28.6   Smokeless tobacco: Never  Vaping Use   Vaping Use: Never used  Substance and Sexual Activity   Alcohol use: No   Drug use: No   Sexual activity: Never  Other Topics Concern   Not on file  Social History Narrative   Not on file   Social Determinants of Health   Financial Resource Strain: Not on file  Food Insecurity: Not on file  Transportation Needs: Not on file  Physical Activity: Not on file  Stress: Not on file  Social Connections: Not on file    Review of Systems: Gen: Denies fever, chills, anorexia. Denies fatigue, weakness, weight loss.  CV: Denies chest pain, palpitations, syncope, peripheral edema, and claudication. Resp: Denies dyspnea at rest, cough, wheezing, coughing up blood, and pleurisy. GI: Denies vomiting blood, jaundice, and fecal incontinence.   Denies dysphagia or odynophagia. Derm: Denies rash, itching, dry skin Psych: Denies depression, anxiety, memory loss, confusion. No homicidal or suicidal ideation.  Heme: Denies bruising, bleeding, and enlarged lymph nodes.  Physical Exam: There were no vitals taken for this visit. General:   Alert and oriented. No distress noted. Pleasant and cooperative.  Head:  Normocephalic and atraumatic. Eyes:  Conjuctiva clear without scleral icterus. Mouth:  Oral mucosa pink and moist. Good dentition. No lesions. Heart:  S1, S2 present without murmurs appreciated. Lungs:  Clear to auscultation bilaterally. No wheezes, rales, or rhonchi. No distress.  Abdomen:  +BS, soft, non-tender and non-distended. No rebound or guarding. No HSM or masses noted. Msk:  Symmetrical without gross deformities. Normal posture. Extremities:  Without edema. Neurologic:  Alert and  oriented x4 Psych:  Alert and cooperative. Normal mood and affect.

## 2020-09-18 ENCOUNTER — Encounter: Payer: Self-pay | Admitting: Gastroenterology

## 2020-09-18 ENCOUNTER — Ambulatory Visit: Payer: PPO | Admitting: Gastroenterology

## 2020-09-18 ENCOUNTER — Encounter: Payer: Self-pay | Admitting: Internal Medicine

## 2020-09-19 ENCOUNTER — Ambulatory Visit: Payer: PPO

## 2020-10-03 DIAGNOSIS — N1832 Chronic kidney disease, stage 3b: Secondary | ICD-10-CM | POA: Diagnosis not present

## 2020-10-03 DIAGNOSIS — I5032 Chronic diastolic (congestive) heart failure: Secondary | ICD-10-CM | POA: Diagnosis not present

## 2020-10-03 DIAGNOSIS — I129 Hypertensive chronic kidney disease with stage 1 through stage 4 chronic kidney disease, or unspecified chronic kidney disease: Secondary | ICD-10-CM | POA: Diagnosis not present

## 2020-10-03 DIAGNOSIS — E211 Secondary hyperparathyroidism, not elsewhere classified: Secondary | ICD-10-CM | POA: Diagnosis not present

## 2020-10-05 DIAGNOSIS — E119 Type 2 diabetes mellitus without complications: Secondary | ICD-10-CM | POA: Insufficient documentation

## 2020-10-06 DIAGNOSIS — E782 Mixed hyperlipidemia: Secondary | ICD-10-CM | POA: Diagnosis not present

## 2020-10-06 DIAGNOSIS — E1122 Type 2 diabetes mellitus with diabetic chronic kidney disease: Secondary | ICD-10-CM | POA: Diagnosis not present

## 2020-10-06 DIAGNOSIS — H903 Sensorineural hearing loss, bilateral: Secondary | ICD-10-CM | POA: Diagnosis not present

## 2020-10-08 DIAGNOSIS — F411 Generalized anxiety disorder: Secondary | ICD-10-CM | POA: Insufficient documentation

## 2020-10-08 DIAGNOSIS — M1990 Unspecified osteoarthritis, unspecified site: Secondary | ICD-10-CM | POA: Diagnosis not present

## 2020-10-08 DIAGNOSIS — E782 Mixed hyperlipidemia: Secondary | ICD-10-CM | POA: Diagnosis not present

## 2020-10-08 DIAGNOSIS — F5104 Psychophysiologic insomnia: Secondary | ICD-10-CM | POA: Insufficient documentation

## 2020-10-08 DIAGNOSIS — I10 Essential (primary) hypertension: Secondary | ICD-10-CM | POA: Diagnosis not present

## 2020-10-08 DIAGNOSIS — I5022 Chronic systolic (congestive) heart failure: Secondary | ICD-10-CM | POA: Insufficient documentation

## 2020-10-08 DIAGNOSIS — M8580 Other specified disorders of bone density and structure, unspecified site: Secondary | ICD-10-CM | POA: Diagnosis not present

## 2020-10-08 DIAGNOSIS — Z87311 Personal history of (healed) other pathological fracture: Secondary | ICD-10-CM | POA: Diagnosis not present

## 2020-10-08 DIAGNOSIS — E875 Hyperkalemia: Secondary | ICD-10-CM | POA: Diagnosis not present

## 2020-10-08 DIAGNOSIS — E1122 Type 2 diabetes mellitus with diabetic chronic kidney disease: Secondary | ICD-10-CM | POA: Diagnosis not present

## 2020-10-08 DIAGNOSIS — M797 Fibromyalgia: Secondary | ICD-10-CM | POA: Insufficient documentation

## 2020-10-08 DIAGNOSIS — D509 Iron deficiency anemia, unspecified: Secondary | ICD-10-CM | POA: Diagnosis not present

## 2020-10-08 DIAGNOSIS — J449 Chronic obstructive pulmonary disease, unspecified: Secondary | ICD-10-CM | POA: Diagnosis not present

## 2020-10-09 ENCOUNTER — Ambulatory Visit: Payer: PPO | Admitting: Internal Medicine

## 2020-10-10 DIAGNOSIS — I5032 Chronic diastolic (congestive) heart failure: Secondary | ICD-10-CM | POA: Diagnosis not present

## 2020-10-10 DIAGNOSIS — E876 Hypokalemia: Secondary | ICD-10-CM | POA: Diagnosis not present

## 2020-10-10 DIAGNOSIS — E211 Secondary hyperparathyroidism, not elsewhere classified: Secondary | ICD-10-CM | POA: Diagnosis not present

## 2020-10-10 DIAGNOSIS — I129 Hypertensive chronic kidney disease with stage 1 through stage 4 chronic kidney disease, or unspecified chronic kidney disease: Secondary | ICD-10-CM | POA: Diagnosis not present

## 2020-10-10 DIAGNOSIS — N1832 Chronic kidney disease, stage 3b: Secondary | ICD-10-CM | POA: Diagnosis not present

## 2020-10-17 DIAGNOSIS — N760 Acute vaginitis: Secondary | ICD-10-CM | POA: Diagnosis not present

## 2020-10-17 DIAGNOSIS — R062 Wheezing: Secondary | ICD-10-CM | POA: Diagnosis not present

## 2020-10-22 DIAGNOSIS — Z8709 Personal history of other diseases of the respiratory system: Secondary | ICD-10-CM | POA: Diagnosis not present

## 2020-10-22 DIAGNOSIS — Z09 Encounter for follow-up examination after completed treatment for conditions other than malignant neoplasm: Secondary | ICD-10-CM | POA: Diagnosis not present

## 2020-10-22 DIAGNOSIS — Z872 Personal history of diseases of the skin and subcutaneous tissue: Secondary | ICD-10-CM | POA: Diagnosis not present

## 2020-10-31 DIAGNOSIS — M25571 Pain in right ankle and joints of right foot: Secondary | ICD-10-CM | POA: Diagnosis not present

## 2020-10-31 DIAGNOSIS — M25572 Pain in left ankle and joints of left foot: Secondary | ICD-10-CM | POA: Diagnosis not present

## 2020-11-06 ENCOUNTER — Emergency Department (HOSPITAL_COMMUNITY)
Admission: EM | Admit: 2020-11-06 | Discharge: 2020-11-06 | Disposition: A | Discharge status: Home / Self Care | Payer: PPO | Attending: Emergency Medicine | Admitting: Emergency Medicine

## 2020-11-06 ENCOUNTER — Encounter (HOSPITAL_COMMUNITY): Payer: Self-pay | Admitting: *Deleted

## 2020-11-06 ENCOUNTER — Other Ambulatory Visit: Payer: Self-pay

## 2020-11-06 DIAGNOSIS — I5032 Chronic diastolic (congestive) heart failure: Secondary | ICD-10-CM | POA: Diagnosis not present

## 2020-11-06 DIAGNOSIS — R109 Unspecified abdominal pain: Secondary | ICD-10-CM | POA: Insufficient documentation

## 2020-11-06 DIAGNOSIS — E211 Secondary hyperparathyroidism, not elsewhere classified: Secondary | ICD-10-CM | POA: Diagnosis not present

## 2020-11-06 DIAGNOSIS — E876 Hypokalemia: Secondary | ICD-10-CM | POA: Diagnosis not present

## 2020-11-06 DIAGNOSIS — I129 Hypertensive chronic kidney disease with stage 1 through stage 4 chronic kidney disease, or unspecified chronic kidney disease: Secondary | ICD-10-CM | POA: Diagnosis not present

## 2020-11-06 DIAGNOSIS — Z5321 Procedure and treatment not carried out due to patient leaving prior to being seen by health care provider: Secondary | ICD-10-CM | POA: Diagnosis not present

## 2020-11-06 DIAGNOSIS — N1832 Chronic kidney disease, stage 3b: Secondary | ICD-10-CM | POA: Diagnosis not present

## 2020-11-06 LAB — CBC
HCT: 40.7 % (ref 36.0–46.0)
Hemoglobin: 12.7 g/dL (ref 12.0–15.0)
MCH: 25.1 pg — ABNORMAL LOW (ref 26.0–34.0)
MCHC: 31.2 g/dL (ref 30.0–36.0)
MCV: 80.4 fL (ref 80.0–100.0)
Platelets: 313 10*3/uL (ref 150–400)
RBC: 5.06 MIL/uL (ref 3.87–5.11)
RDW: 15.4 % (ref 11.5–15.5)
WBC: 11.8 10*3/uL — ABNORMAL HIGH (ref 4.0–10.5)
nRBC: 0.2 % (ref 0.0–0.2)

## 2020-11-06 LAB — COMPREHENSIVE METABOLIC PANEL
ALT: 22 U/L (ref 0–44)
AST: 24 U/L (ref 15–41)
Albumin: 3.8 g/dL (ref 3.5–5.0)
Alkaline Phosphatase: 56 U/L (ref 38–126)
Anion gap: 8 (ref 5–15)
BUN: 16 mg/dL (ref 8–23)
CO2: 29 mmol/L (ref 22–32)
Calcium: 8.8 mg/dL — ABNORMAL LOW (ref 8.9–10.3)
Chloride: 96 mmol/L — ABNORMAL LOW (ref 98–111)
Creatinine, Ser: 1.04 mg/dL — ABNORMAL HIGH (ref 0.44–1.00)
GFR, Estimated: 55 mL/min — ABNORMAL LOW (ref >60)
Glucose, Bld: 111 mg/dL — ABNORMAL HIGH (ref 70–99)
Potassium: 3.2 mmol/L — ABNORMAL LOW (ref 3.5–5.1)
Sodium: 133 mmol/L — ABNORMAL LOW (ref 135–145)
Total Bilirubin: 0.6 mg/dL (ref 0.3–1.2)
Total Protein: 7.4 g/dL (ref 6.5–8.1)

## 2020-11-06 LAB — LIPASE, BLOOD: Lipase: 24 U/L (ref 11–51)

## 2020-11-06 NOTE — ED Notes (Signed)
Pt states she ate some lettuce on Sunday while out to eat and she has hx of where lettuce makes her stomach hurt  and has not eaten lettuce for past 25 yrs.

## 2020-11-06 NOTE — ED Triage Notes (Signed)
Pt with abd pain since Tuesday, denies diarrhea. +N/V

## 2020-11-07 ENCOUNTER — Encounter (HOSPITAL_BASED_OUTPATIENT_CLINIC_OR_DEPARTMENT_OTHER): Payer: Self-pay

## 2020-11-07 ENCOUNTER — Emergency Department (HOSPITAL_BASED_OUTPATIENT_CLINIC_OR_DEPARTMENT_OTHER): Payer: PPO

## 2020-11-07 ENCOUNTER — Emergency Department (HOSPITAL_BASED_OUTPATIENT_CLINIC_OR_DEPARTMENT_OTHER)
Admission: EM | Admit: 2020-11-07 | Discharge: 2020-11-07 | Disposition: A | Discharge status: Home / Self Care | Payer: PPO | Attending: Emergency Medicine | Admitting: Emergency Medicine

## 2020-11-07 DIAGNOSIS — B379 Candidiasis, unspecified: Secondary | ICD-10-CM | POA: Diagnosis not present

## 2020-11-07 DIAGNOSIS — K449 Diaphragmatic hernia without obstruction or gangrene: Secondary | ICD-10-CM | POA: Diagnosis not present

## 2020-11-07 DIAGNOSIS — Z96653 Presence of artificial knee joint, bilateral: Secondary | ICD-10-CM | POA: Diagnosis not present

## 2020-11-07 DIAGNOSIS — Z7951 Long term (current) use of inhaled steroids: Secondary | ICD-10-CM | POA: Insufficient documentation

## 2020-11-07 DIAGNOSIS — J45909 Unspecified asthma, uncomplicated: Secondary | ICD-10-CM | POA: Insufficient documentation

## 2020-11-07 DIAGNOSIS — D7219 Other eosinophilia: Secondary | ICD-10-CM | POA: Diagnosis not present

## 2020-11-07 DIAGNOSIS — R1084 Generalized abdominal pain: Secondary | ICD-10-CM | POA: Insufficient documentation

## 2020-11-07 DIAGNOSIS — N183 Chronic kidney disease, stage 3 unspecified: Secondary | ICD-10-CM | POA: Diagnosis not present

## 2020-11-07 DIAGNOSIS — R1031 Right lower quadrant pain: Secondary | ICD-10-CM

## 2020-11-07 DIAGNOSIS — Z79899 Other long term (current) drug therapy: Secondary | ICD-10-CM | POA: Insufficient documentation

## 2020-11-07 DIAGNOSIS — B370 Candidal stomatitis: Secondary | ICD-10-CM

## 2020-11-07 DIAGNOSIS — K5730 Diverticulosis of large intestine without perforation or abscess without bleeding: Secondary | ICD-10-CM | POA: Diagnosis not present

## 2020-11-07 DIAGNOSIS — I10 Essential (primary) hypertension: Secondary | ICD-10-CM | POA: Diagnosis not present

## 2020-11-07 DIAGNOSIS — Z87891 Personal history of nicotine dependence: Secondary | ICD-10-CM | POA: Diagnosis not present

## 2020-11-07 DIAGNOSIS — J449 Chronic obstructive pulmonary disease, unspecified: Secondary | ICD-10-CM | POA: Insufficient documentation

## 2020-11-07 DIAGNOSIS — J4550 Severe persistent asthma, uncomplicated: Secondary | ICD-10-CM | POA: Diagnosis not present

## 2020-11-07 DIAGNOSIS — I129 Hypertensive chronic kidney disease with stage 1 through stage 4 chronic kidney disease, or unspecified chronic kidney disease: Secondary | ICD-10-CM | POA: Diagnosis not present

## 2020-11-07 DIAGNOSIS — R109 Unspecified abdominal pain: Secondary | ICD-10-CM | POA: Diagnosis not present

## 2020-11-07 LAB — CBC
HCT: 40.8 % (ref 36.0–46.0)
Hemoglobin: 12.7 g/dL (ref 12.0–15.0)
MCH: 24.7 pg — ABNORMAL LOW (ref 26.0–34.0)
MCHC: 31.1 g/dL (ref 30.0–36.0)
MCV: 79.4 fL — ABNORMAL LOW (ref 80.0–100.0)
Platelets: 320 10*3/uL (ref 150–400)
RBC: 5.14 MIL/uL — ABNORMAL HIGH (ref 3.87–5.11)
RDW: 15.6 % — ABNORMAL HIGH (ref 11.5–15.5)
WBC: 10.3 10*3/uL (ref 4.0–10.5)
nRBC: 0.4 % — ABNORMAL HIGH (ref 0.0–0.2)

## 2020-11-07 LAB — COMPREHENSIVE METABOLIC PANEL
ALT: 20 U/L (ref 0–44)
AST: 20 U/L (ref 15–41)
Albumin: 4.3 g/dL (ref 3.5–5.0)
Alkaline Phosphatase: 51 U/L (ref 38–126)
Anion gap: 11 (ref 5–15)
BUN: 23 mg/dL (ref 8–23)
CO2: 33 mmol/L — ABNORMAL HIGH (ref 22–32)
Calcium: 9.3 mg/dL (ref 8.9–10.3)
Chloride: 92 mmol/L — ABNORMAL LOW (ref 98–111)
Creatinine, Ser: 1.41 mg/dL — ABNORMAL HIGH (ref 0.44–1.00)
GFR, Estimated: 38 mL/min — ABNORMAL LOW (ref >60)
Glucose, Bld: 105 mg/dL — ABNORMAL HIGH (ref 70–99)
Potassium: 3.5 mmol/L (ref 3.5–5.1)
Sodium: 136 mmol/L (ref 135–145)
Total Bilirubin: 0.6 mg/dL (ref 0.3–1.2)
Total Protein: 7.7 g/dL (ref 6.5–8.1)

## 2020-11-07 LAB — URINALYSIS, ROUTINE W REFLEX MICROSCOPIC
Bilirubin Urine: NEGATIVE
Glucose, UA: NEGATIVE mg/dL
Ketones, ur: NEGATIVE mg/dL
Leukocytes,Ua: NEGATIVE
Nitrite: NEGATIVE
Protein, ur: NEGATIVE mg/dL
Specific Gravity, Urine: 1.014 (ref 1.005–1.030)
pH: 5.5 (ref 5.0–8.0)

## 2020-11-07 LAB — LACTIC ACID, PLASMA: Lactic Acid, Venous: 1 mmol/L (ref 0.5–1.9)

## 2020-11-07 LAB — LIPASE, BLOOD: Lipase: 18 U/L (ref 11–51)

## 2020-11-07 MED ORDER — IOHEXOL 350 MG/ML SOLN
50.0000 mL | Freq: Once | INTRAVENOUS | Status: AC | PRN
  Administered 2020-11-07: 50 mL via INTRAVENOUS

## 2020-11-07 MED ORDER — FENTANYL CITRATE (PF) 100 MCG/2ML IJ SOLN
50.0000 ug | Freq: Once | INTRAMUSCULAR | Status: AC
  Administered 2020-11-07: 50 ug via INTRAVENOUS
  Filled 2020-11-07: qty 2

## 2020-11-07 MED ORDER — OXYCODONE HCL 5 MG PO TABS
10.0000 mg | ORAL_TABLET | Freq: Once | ORAL | Status: AC
Start: 2020-11-07 — End: 2020-11-07
  Administered 2020-11-07: 10 mg via ORAL
  Filled 2020-11-07: qty 2

## 2020-11-07 MED ORDER — FENTANYL CITRATE (PF) 100 MCG/2ML IJ SOLN
50.0000 ug | INTRAMUSCULAR | Status: DC | PRN
  Administered 2020-11-07: 50 ug via INTRAVENOUS
  Filled 2020-11-07: qty 2

## 2020-11-07 MED ORDER — POLYETHYLENE GLYCOL 3350 17 G PO PACK: 17.0000 g | PACK | Freq: Every day | ORAL | 0 refills | Status: AC

## 2020-11-07 MED ORDER — ONDANSETRON HCL 4 MG/2ML IJ SOLN
4.0000 mg | Freq: Once | INTRAMUSCULAR | Status: AC
  Administered 2020-11-07: 4 mg via INTRAVENOUS
  Filled 2020-11-07: qty 2

## 2020-11-07 MED ORDER — NYSTATIN 100000 UNIT/ML MT SUSP: 500000.0000 [IU] | Freq: Four times a day (QID) | OROMUCOSAL | 0 refills | Status: DC

## 2020-11-07 NOTE — ED Notes (Signed)
Given drink and crackers for PO challenge.

## 2020-11-07 NOTE — Discharge Instructions (Addendum)
Your history, exam, and work-up today were overall reassuring and we did not see any evidence of a surgical problem in your abdomen.  I suspect that there is some constipation causing her discomfort so please increase the MiraLAX to daily and increase her hydration.  Please also use the nystatin mouthwash to help with the thrush we discovered.  Please call and follow-up with your primary doctor in the neck several days.  If any symptoms change or worsen acutely, please return to the nearest emergency department.

## 2020-11-07 NOTE — ED Triage Notes (Signed)
Pt arrives POV with c/o of right lower quadrant abdominal pain since Tuesday.  Reports nausea but no vomiting.  Denies diarrhea.

## 2020-11-07 NOTE — ED Notes (Signed)
Patient up to restroom  States pain worse with walking.

## 2020-11-07 NOTE — ED Notes (Signed)
Pt ambulated to bathroom with assistance.

## 2020-11-07 NOTE — ED Provider Notes (Signed)
[facility] EMERGENCY DEPT Provider Note   CSN: [id number] Arrival date & time: 11/07/20  1611     History Chief Complaint  Patient presents with   Abdominal Pain    Glenda Terry is a 80 y.o. female.  The history is provided by the patient, a relative and medical records. No language interpreter was used.  Abdominal Pain Pain location:  R flank and RLQ Pain quality: sharp   Pain radiates to:  R flank Pain severity:  Severe Onset quality:  Gradual Duration:  4 days Timing:  Constant Progression:  Waxing and waning Chronicity:  New Relieved by:  Nothing Worsened by:  Palpation Ineffective treatments:  None tried Associated symptoms: nausea   Associated symptoms: no chills, no constipation, no cough, no diarrhea, no dysuria, no fatigue, no fever, no shortness of breath, no vaginal bleeding, no vaginal discharge and no vomiting       Past Medical History:  Diagnosis Date   Anxiety    Asthma    Back pain, chronic    CKD (chronic kidney disease) stage 3, GFR 30-59 ml/min (HCC)    Compression fx, thoracic spine (HCC)    T - 11   COPD (chronic obstructive pulmonary disease) (HCC) 12/16/2015   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HCC)    Essential hypertension    GERD (gastroesophageal reflux disease)    HOH (hard of hearing)    Inflammatory arthritis    Iron deficiency anemia 01/2012   Pulmonary eosinophilia ([place])    Spinal stenosis    Tubular adenoma 11/2012    Patient Active Problem List   Diagnosis Date Noted   Nausea with vomiting 01/14/2020   Chronic bilateral low back pain 10/25/2019   Chronic constipation 10/22/2019   Bilateral lower extremity edema 10/22/2019   Chronic anemia 10/22/2019   Post-menopausal osteoporosis 10/22/2019   Tachycardia 10/18/2019   Primary osteoarthritis of knee 10/16/2019   Primary osteoarthritis of left knee 10/15/2019   Left lower quadrant abdominal pain 02/16/2019   Hx of adenomatous colonic polyps 11/28/2017   S/P  shoulder replacement, 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   Eosinophil count raised 06/09/2016   Severe persistent asthma 06/07/2016   COPD (chronic obstructive pulmonary disease) ([place]) 12/16/2015   Asthma exacerbation 09/20/2015   Acute respiratory failure ([place]) 09/20/2015   CKD (chronic kidney disease) stage 3, GFR 30-59 ml/min (HCC) 09/20/2015   Hemorrhoids, internal 08/21/2015   S/P shoulder replacement 05/24/2014   Microcytic anemia 11/01/2012   Family hx of colon cancer 11/01/2012   Spinal stenosis of lumbar region without neurogenic claudication 04/20/2012   Herniated lumbar intervertebral disc 04/20/2012   Difficulty in walking(719.7) 03/13/2012   Stiffness of joint, not elsewhere classified, lower leg 03/13/2012   Weakness of right leg 03/13/2012   OA (osteoarthritis) of knee 01/24/2012   Anxiety 02/09/2011   HTN (hypertension) 02/09/2011   Arthritis 02/09/2011   GERD (gastroesophageal reflux disease) 02/09/2011   Rheumatoid arthritis ([place]) 02/09/2011    Past Surgical History:  Procedure Laterality Date   ABDOMINAL HYSTERECTOMY     BACK SURGERY     CARPAL TUNNEL RELEASE Right 06/2012   CATARACT EXTRACTION W/PHACO Left 11/19/2014   Procedure: CATARACT EXTRACTION PHACO AND INTRAOCULAR LENS PLACEMENT ([place]);  Surgeon: Williams Che, MD;  Location: [facility];  Service: Ophthalmology;  Laterality: Left;  CDE: 4.77   CATARACT EXTRACTION W/PHACO Right 02/03/2015   Procedure: CATARACT EXTRACTION PHACO AND INTRAOCULAR LENS PLACEMENT (I[facility]);  Surgeon: Williams Che, MD;  Location: [facility];  Service: Ophthalmology;  Laterality:  Right;  CDE: 4.54   COLONOSCOPY  06/19/2008   RMR: tortuous and elongated colon with scattered left-sided diverticula/colonic mucosa appeared entirely normal. Prior colonic ulcers had resolved.   COLONOSCOPY  12/2007   Dr. Gala Romney: Scattered diffuse sigmoid diverticula, 2 areas of ulceration at the hepatic flexure. Biopsies unremarkable.   COLONOSCOPY N/A 11/20/2012   next TCS  11/2017   COLONOSCOPY WITH PROPOFOL N/A 12/22/2017   Procedure: COLONOSCOPY WITH PROPOFOL;  Surgeon: Daneil Dolin, MD; diverticula in the sigmoid and descending colon and 2 tubular adenomas.  No recommendations to repeat.     DECOMPRESSIVE LUMBAR LAMINECTOMY LEVEL 2  04/20/2012   Procedure: DECOMPRESSIVE LUMBAR LAMINECTOMY LEVEL 2;  Surgeon: Tobi Bastos, MD;  Location: [facility];  Service: Orthopedics;  Laterality: Right;  Decompressive Lumbar Laminectomy of the L4 - L5 and L5 - S1 Complete/Laminectomy L5 on the Right (X-Ray)   ESOPHAGOGASTRODUODENOSCOPY  12/2007   Dr. 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 Small hiatal hernia. Slightly pale duodenal mucosa (biopsy unremarkable)   ESOPHAGOGASTRODUODENOSCOPY (EGD) WITH PROPOFOL N/A 01/20/2017   Dr. Gala Romney: Medium sized hiatal hernia empiric esophageal dilation for history of dysphagia   ESOPHAGOGASTRODUODENOSCOPY (EGD) WITH PROPOFOL N/A 04/08/2020   Surgeon: Eloise Harman, DO;  medium size hiatal hernia 3 to 4 cm chicken bone lodged in the gastric antrum just proximal to the pylorus removed with a snare with mild bleeding at the site of lodged bone.  Normal examined duodenum.   EYE SURGERY     BIL CATARACTS   FOREIGN BODY REMOVAL N/A 04/08/2020   Procedure: FOREIGN BODY REMOVAL;  Surgeon: Eloise Harman, DO;  [facility];  Service: Endoscopy;  Laterality: N/A;   IR KYPHO THORACIC WITH BONE BIOPSY  02/08/2017   IR RADIOLOGIST EVAL & MGMT  02/02/2017   JOINT REPLACEMENT     KNEE ARTHROSCOPY Right    MALONEY DILATION N/A 01/20/2017   Procedure: MALONEY DILATION;  Surgeon: Daneil Dolin, MD;  [facility];  Service: Endoscopy;  Laterality: N/A;   POLYPECTOMY  12/22/2017   Procedure: POLYPECTOMY;  Surgeon: Daneil Dolin, MD;  [facility];  Service: Endoscopy;;   REVERSE SHOULDER ARTHROPLASTY Right 05/24/2014   Procedure: RIGHT SHOULDER REVERSE  ARTHROPLASTY;  Surgeon: Netta Cedars, MD;  Location: [facility];  Service: Orthopedics;  Laterality: Right;   REVERSE SHOULDER ARTHROPLASTY Left 06/10/2017   Procedure: LEFT REVERSE SHOULDER ARTHROPLASTY;  Surgeon: Netta Cedars, MD;  Location: [facility];  Service: Orthopedics;  Laterality: Left;   TOTAL KNEE ARTHROPLASTY  01/24/2012   Procedure: TOTAL KNEE ARTHROPLASTY;  Surgeon: Gearlean Alf, MD;  Location: [facility];  Service: Orthopedics;  Laterality: Right;   TOTAL KNEE ARTHROPLASTY Left 10/15/2019   Procedure: TOTAL KNEE ARTHROPLASTY;  Surgeon: Gaynelle Arabian, MD;  Location: [facility];  Service: Orthopedics;  Laterality: Left;  3mn   TUBAL LIGATION       OB History     Gravida  3   Para  3   Term  2   Preterm  1   AB      Living  3      SAB      IAB      Ectopic      Multiple      Live Births              Family History  Problem Relation Age of Onset   Breast cancer Sister  Colon cancer Sister        two deceased, one living and terminal, one current undergoing treatment, ages 76, 78, 52, 38   Hypertension Mother    Stroke Mother    Heart attack Mother    Hypertension Father    Prostate cancer Father     Social History   Tobacco Use   Smoking status: Former    Packs/day: 2.00    Years: 40.00    Pack years: 80.00    Types: Cigarettes    Quit date: 01/19/1992    Years since quitting: 28.8   Smokeless tobacco: Never  Vaping Use   Vaping Use: Never used  Substance Use Topics   Alcohol use: No   Drug use: No    Home Medications Prior to Admission medications   Medication Sig Start Date End Date Taking? Authorizing Provider  Acetaminophen (APAP 500 PO) Take 2 tablets by mouth 2 (two) times daily as needed.    [provider]  albuterol (PROAIR HFA) 108 (90 Base) MCG/ACT inhaler Inhale 2 puffs into the lungs every 6 (six) hours as needed for wheezing or shortness of breath (For COPD).     [provider]  ALPRAZolam Duanne Moron) 0.5 MG  tablet Take 0.5 tablets (0.25 mg total) by mouth 2 (two) times daily as needed for anxiety. 10/25/19   Gerlene Fee, NP  amLODipine (NORVASC) 5 MG tablet Take 1 tablet (5 mg total) by mouth daily. 06/17/20   Elouise Munroe, MD  Benralizumab 30 MG/ML SOSY Inject 30 mg into the skin. Once A Day Every 56 Days 10/17/19   [provider]  bisacodyl (DULCOLAX) 5 MG EC tablet Take 5 mg by mouth daily as needed for moderate constipation.    [provider]  calcium carbonate (CALCIUM 600) 600 MG TABS tablet Take 600 mg by mouth 2 (two) times daily with a meal.    [provider]  Cholecalciferol (VITAMIN D3) 50 MCG (2000 UT) CAPS Take 1 capsule by mouth daily.    [provider]  denosumab (PROLIA) 60 MG/ML SOSY injection Inject 60 mg into the skin every 6 (six) months. 03/01/18   [provider]  diphenhydrAMINE (BENADRYL) 25 mg capsule Take 25 mg by mouth at bedtime.    [provider]  DULERA 200-5 MCG/ACT AERO Inhale 2 puffs into the lungs 2 (two) times daily. 10/25/19   Gerlene Fee, NP  esomeprazole (NEXIUM) 40 MG capsule Take 1 capsule (40 mg total) by mouth 2 (two) times daily. 10/25/19   Gerlene Fee, NP  ferrous sulfate 325 (65 FE) MG tablet Take 1 tablet (325 mg total) by mouth daily with breakfast. 10/25/19   Gerlene Fee, NP  fexofenadine (ALLEGRA) 180 MG tablet Take 180 mg by mouth daily.    [provider]  fluticasone (FLONASE) 50 MCG/ACT nasal spray Place 1 spray into both nostrils daily as needed for allergies or rhinitis.  10/17/19   [provider]  ipratropium-albuterol (DUONEB) 0.5-2.5 (3) MG/3ML SOLN Take 3 mLs by nebulization every 4 (four) hours as needed.    [provider]  ketotifen (ZADITOR) 0.025 % ophthalmic solution Place 1 drop into both eyes 2 (two) times daily as needed (allergies). 10/25/19   Gerlene Fee, NP  Magnesium 250 MG TABS Take 250 mg by mouth daily.    [provider]  Menthol, Topical Analgesic, (BIOFREEZE) 4 % GEL Apply 1 application topically 2 (two) times daily. Special Instructions:  for back pain 10/23/19   [provider]  montelukast (SINGULAIR) 10 MG tablet Take 1 tablet (10 mg total) by mouth at bedtime. 07/25/20   Elouise Munroe, MD  Oxycodone HCl 10 MG TABS Take 10 mg by mouth every 6 (six) hours. 12/29/19   [provider]  Polyethyl Glycol-Propyl Glycol (SYSTANE OP) Place 1 drop into both eyes 3 (three) times daily as needed (dry/irritated eyes.).     [provider]  potassium chloride (KLOR-CON) 10 MEQ tablet Take 1 tablet (10 mEq total) by mouth every other day. IN THE MORNING 10/25/19   Gerlene Fee, NP  pravastatin (PRAVACHOL) 10 MG tablet Take 10 mg by mouth daily. 03/27/20   [provider]  torsemide (DEMADEX) 10 MG tablet Take 20 mg by mouth daily.    [provider]  vitamin C (ASCORBIC ACID) 500 MG tablet Take 500 mg by mouth daily.    [provider]    Allergies    Flexeril [cyclobenzaprine], Other, Keflex [cephalexin], Aspirin, Adhesive [tape], and Chlorhexidine  Review of Systems   Review of Systems  Constitutional:  Negative for chills, fatigue and fever.  HENT:  Negative for congestion.   Respiratory:  Negative for cough and shortness of breath.   Gastrointestinal:  Positive for abdominal pain and nausea. Negative for constipation, diarrhea and vomiting.  Genitourinary:  Positive for flank pain. Negative for dysuria, frequency, vaginal bleeding and vaginal discharge.  Musculoskeletal:  Positive for back pain (chronic back pain reported).  Skin:  Negative for rash.  Neurological:  Negative for light-headedness and headaches.  Psychiatric/Behavioral:  Negative for agitation.   All other systems reviewed and are negative.  Physical Exam Updated Vital Signs BP (!) 157/91   Pulse 94   Temp 98.5 F (36.9 C)   Resp 18   Ht 5' (1.524 m)   Wt 83.9 kg    SpO2 100%   BMI 36.13 kg/m   Physical Exam Vitals and nursing note reviewed.  Constitutional:      General: She is not in acute distress.    Appearance: She is well-developed. She is not ill-appearing, toxic-appearing or diaphoretic.  HENT:     Head: Normocephalic and atraumatic.  Eyes:     Conjunctiva/sclera: Conjunctivae normal.  Cardiovascular:     Rate and Rhythm: Normal rate and regular rhythm.     Heart sounds: No murmur heard. Pulmonary:     Effort: Pulmonary effort is normal. No respiratory distress.     Breath sounds: Normal breath sounds.  Abdominal:     Palpations: Abdomen is soft.     Tenderness: There is abdominal tenderness (mild diffuse tenderness but most in RLQ) in the right lower quadrant and suprapubic area. There is no right CVA tenderness, left CVA tenderness, guarding or rebound.  Musculoskeletal:     Cervical back: Neck supple.  Skin:    General: Skin is warm and dry.     Capillary Refill: Capillary refill takes less than 2 seconds.  Neurological:     Mental Status: She is alert.  Psychiatric:        Mood and Affect: Mood normal.    ED Results / Procedures / Treatments   Labs (all labs ordered are listed, but only abnormal results are displayed) Labs Reviewed  COMPREHENSIVE METABOLIC PANEL - Abnormal; Notable for the following components:      Result Value   Chloride 92 (*)    CO2 33 (*)    Glucose, Bld 105 (*)  Creatinine, Ser 1.41 (*)    GFR, Estimated 38 (*)    All other components within normal limits  CBC - Abnormal; Notable for the following components:   RBC 5.14 (*)    MCV 79.4 (*)    MCH 24.7 (*)    RDW 15.6 (*)    nRBC 0.4 (*)    All other components within normal limits  URINALYSIS, ROUTINE W REFLEX MICROSCOPIC - Abnormal; Notable for the following components:   Hgb urine dipstick SMALL (*)    Bacteria, UA RARE (*)    All other components within normal limits  LIPASE, BLOOD  LACTIC ACID, PLASMA    EKG EKG  Interpretation  Date/Time:  Friday November 07 2020 16:59:52 EDT Ventricular Rate:  92 PR Interval:  138 QRS Duration: 95 QT Interval:  371 QTC Calculation: 459 R Axis:   -20 Text Interpretation: Sinus rhythm Borderline left axis deviation Baseline wander in lead(s) V5 When compared to prior, similar appearance. No STEMI Confirmed by Antony Blackbird [phone]) on 11/07/2020 5:45:32 PM  Radiology CT ABDOMEN PELVIS W CONTRAST  Addendum Date: 11/07/2020   ADDENDUM REPORT: 11/07/2020 19:31 ADDENDUM: These results were called by telephone at the time of interpretation on 11/07/2020 at 7:29 pm to provider [facility] , who verbally acknowledged these results. Electronically Signed   By: Iven Finn M.D.   On: 11/07/2020 19:31   Result Date: 11/07/2020 CLINICAL DATA:  Right lower quadrant abdominal pain. Some right flank pain. Blood in urine. Suspected kidney stone. Patient also presenting with anterior pain - rule out right-sided diverticulitis, appendicitis. EXAM: CT ABDOMEN AND PELVIS WITH CONTRAST TECHNIQUE: Multidetector CT imaging of the abdomen and pelvis was performed using the standard protocol following bolus administration of intravenous contrast. CONTRAST:  28m OMNIPAQUE IOHEXOL 350 MG/ML SOLN COMPARISON:  CT abdomen pelvis 04/08/2020. FINDINGS: Lower chest: No acute abnormality. Moderate to large volume hiatal hernia containing the majority of the stomach. Hepatobiliary: Similar-appearing right hepatic dome subcentimeter hypodensities too small to characterize (5:28). No new focal liver abnormality. No gallstones, gallbladder wall thickening, or pericholecystic fluid. No biliary dilatation. Pancreas: No focal lesion. Normal pancreatic contour. No surrounding inflammatory changes. No main pancreatic ductal dilatation. Spleen: Normal in size without focal abnormality. Adrenals/Urinary Tract: No adrenal nodule bilaterally. Bilateral kidneys enhance symmetrically. Subcentimeter hypodensities  are too small to characterize. No hydronephrosis. No hydroureter. The urinary bladder is unremarkable. On delayed imaging, there is no urothelial wall thickening and there are no filling defects in the opacified portions of the bilateral collecting systems or ureters. Stomach/Bowel: Stomach is within normal limits. No evidence of bowel wall thickening or dilatation. No pneumatosis. Scattered colonic diverticulosis. The appendix is normal. No right lower quadrant inflammatory changes. Vascular/Lymphatic: No abdominal aorta or iliac aneurysm. At least moderate atherosclerotic plaque of the aorta and its branches. No abdominal, pelvic, or inguinal lymphadenopathy. Reproductive: Status post hysterectomy. No adnexal masses. Other: No intraperitoneal free fluid. No intraperitoneal free gas. No organized fluid collection. Musculoskeletal: Small fat containing umbilical hernia possibly involving a short segment of the anti mesenteric wall of a short loop of small bowel. No suspicious lytic or blastic osseous lesions. No acute displaced fracture. Dextroscoliosis of the thoracolumbar spine with associated multilevel degenerative changes of the spine. Status post kyphoplasty of the T11 vertebral body in the setting of a from chronic compression fracture. Chronic fracture of the T10 vertebral body. IMPRESSION: 1. Moderate to large volume hiatal hernia containing the majority of the stomach. No associated bowel obstruction or findings  of ischemia. 2. Scattered colonic diverticulosis with no acute diverticulitis. 3. No nephroureterolithiasis. 4. Normal appendix. 5. 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 6.  Aortic Atherosclerosis (ICD10-I70.0). Electronically Signed: By: Iven Finn M.D. On: 11/07/2020 19:10    Procedures Procedures   Medications Ordered in ED Medications  fentaNYL (SUBLIMAZE) injection 50 mcg (50 mcg Intravenous Given  11/07/20 1710)  ondansetron (ZOFRAN) injection 4 mg (4 mg Intravenous Given 11/07/20 1709)  fentaNYL (SUBLIMAZE) injection 50 mcg (50 mcg Intravenous Given 11/07/20 1818)  iohexol (OMNIPAQUE) 350 MG/ML injection 50 mL (50 mLs Intravenous Contrast Given 11/07/20 1841)  oxyCODONE (Oxy IR/ROXICODONE) immediate release tablet 10 mg (10 mg Oral Given 11/07/20 2052)    ED Course  I have reviewed the triage vital signs and the nursing notes.  Pertinent labs & imaging results that were available during my care of the patient were reviewed by me and considered in my medical decision making (see chart for details).    MDM Rules/Calculators/A&P                           CHARIE SUBIA is a 80 y.o. female with a past medical history significant for GERD, hypertension, previous diverticulitis, CKD, asthma, COPD, chronic back pain, and pulmonary eosinophilia who presents with right lower quadrant abdominal pain.  Patient reports that for the last 4 days, she has had sharp pain in her right abdomen.  She reports it is severe, 9 out of 10 in severity.  She reports some nausea but no vomiting.  She reports no constipation or diarrhea normal bowel movement earlier today.  She denies any urinary changes.  She denies history of kidney stones but does report a history of diverticulitis.  She reports it feels different.  She says that the pain has been constant and has not been improving.  Does go towards her right flank but she denies any rash overlying to suggest shingles.  Denies any trauma.  Denies any fevers, chills, chest pain, shortness of breath, or cough.  On exam, patient has tenderness in the right lower quadrant.  She is also tender diffusely on the abdomen but the right lower quadrant is the most tender.  She denies any pelvic symptoms.  Is tender on the right flank.  No rash to suggest shingles.  Lungs clear and chest nontender.  Clinically I am concerned about a kidney stone as the labs and urine  initially showed some hematuria on the urine.  No evidence of UTI with no nitrites or leukocytes.  Patient does not have a leukocytosis.  Kidney function is slightly more elevated than yesterday but is within her normal range.  GFR is above 30 so we will get a CT scan with contrast to help rule out appendicitis versus diverticulitis with abscess versus kidney stone versus other cause.  Patient be given more pain medicine.  Anticipate reassessment after work-up to determine disposition  9:28 PM CT scan returned without any acute pathology.  I went through all the findings with the patient and family.  We discussed the hiatal hernia but do not suspect this is the cause.  There is some stool on the imaging where the patient is hurting.  Patient agrees with some constipation.  We will give her prescription for MiraLAX.  Patient also is complaining of some thrush so we will give her prescription for nystatin mouthwash.  Patient will follow-up with  PCP and maintain hydration.  She agrees with plan of care and was discharged in good condition.   Final Clinical Impression(s) / ED Diagnoses Final diagnoses:  Right lower quadrant abdominal pain  Thrush    Rx / DC Orders ED Discharge Orders          Ordered    nystatin (MYCOSTATIN) 100000 UNIT/ML suspension  4 times daily        11/07/20 2134    polyethylene glycol (MIRALAX / GLYCOLAX) 17 g packet  Daily        11/07/20 2134           Clinical Impression: 1. Right lower quadrant abdominal pain   2. Thrush     Disposition: Discharge  Condition: Good  I have discussed the results, Dx and Tx plan with the pt(& family if present). He/she/they expressed understanding and agree(s) with the plan. Discharge instructions discussed at great length. Strict return precautions discussed and pt &/or family have verbalized understanding of the instructions. No further questions at time of discharge.    New Prescriptions   NYSTATIN (MYCOSTATIN) 100000  UNIT/ML SUSPENSION    Take 5 mLs (500,000 Units total) by mouth 4 (four) times daily.   POLYETHYLENE GLYCOL (MIRALAX / GLYCOLAX) 17 G PACKET    Take 17 g by mouth daily.    Follow Up: Celene Squibb, MD [place] [address] [facility] [employer] [phone]        Ibn Stief, Gwenyth Allegra, MD 11/07/20 2135

## 2020-11-09 ENCOUNTER — Emergency Department (HOSPITAL_BASED_OUTPATIENT_CLINIC_OR_DEPARTMENT_OTHER)
Admission: EM | Admit: 2020-11-09 | Discharge: 2020-11-09 | Disposition: A | Discharge status: Home / Self Care | Payer: PPO | Source: Home / Self Care | Attending: Emergency Medicine | Admitting: Emergency Medicine

## 2020-11-09 ENCOUNTER — Emergency Department (HOSPITAL_BASED_OUTPATIENT_CLINIC_OR_DEPARTMENT_OTHER): Payer: PPO

## 2020-11-09 ENCOUNTER — Encounter (HOSPITAL_BASED_OUTPATIENT_CLINIC_OR_DEPARTMENT_OTHER): Payer: Self-pay | Admitting: Obstetrics and Gynecology

## 2020-11-09 ENCOUNTER — Other Ambulatory Visit: Payer: Self-pay

## 2020-11-09 DIAGNOSIS — E669 Obesity, unspecified: Secondary | ICD-10-CM | POA: Diagnosis not present

## 2020-11-09 DIAGNOSIS — Z01818 Encounter for other preprocedural examination: Secondary | ICD-10-CM | POA: Diagnosis not present

## 2020-11-09 DIAGNOSIS — Z91018 Allergy to other foods: Secondary | ICD-10-CM | POA: Diagnosis not present

## 2020-11-09 DIAGNOSIS — N183 Chronic kidney disease, stage 3 unspecified: Secondary | ICD-10-CM | POA: Diagnosis not present

## 2020-11-09 DIAGNOSIS — M064 Inflammatory polyarthropathy: Secondary | ICD-10-CM | POA: Diagnosis not present

## 2020-11-09 DIAGNOSIS — Z96611 Presence of right artificial shoulder joint: Secondary | ICD-10-CM | POA: Insufficient documentation

## 2020-11-09 DIAGNOSIS — Z90710 Acquired absence of both cervix and uterus: Secondary | ICD-10-CM | POA: Diagnosis not present

## 2020-11-09 DIAGNOSIS — Z8249 Family history of ischemic heart disease and other diseases of the circulatory system: Secondary | ICD-10-CM | POA: Diagnosis not present

## 2020-11-09 DIAGNOSIS — Z87891 Personal history of nicotine dependence: Secondary | ICD-10-CM | POA: Insufficient documentation

## 2020-11-09 DIAGNOSIS — J8289 Other pulmonary eosinophilia, not elsewhere classified: Secondary | ICD-10-CM | POA: Diagnosis not present

## 2020-11-09 DIAGNOSIS — Z96612 Presence of left artificial shoulder joint: Secondary | ICD-10-CM | POA: Diagnosis present

## 2020-11-09 DIAGNOSIS — H9190 Unspecified hearing loss, unspecified ear: Secondary | ICD-10-CM | POA: Diagnosis present

## 2020-11-09 DIAGNOSIS — K219 Gastro-esophageal reflux disease without esophagitis: Secondary | ICD-10-CM | POA: Diagnosis present

## 2020-11-09 DIAGNOSIS — K449 Diaphragmatic hernia without obstruction or gangrene: Secondary | ICD-10-CM | POA: Diagnosis not present

## 2020-11-09 DIAGNOSIS — R1031 Right lower quadrant pain: Secondary | ICD-10-CM | POA: Insufficient documentation

## 2020-11-09 DIAGNOSIS — E249 Cushing's syndrome, unspecified: Secondary | ICD-10-CM | POA: Diagnosis not present

## 2020-11-09 DIAGNOSIS — Z6835 Body mass index (BMI) 35.0-35.9, adult: Secondary | ICD-10-CM | POA: Diagnosis not present

## 2020-11-09 DIAGNOSIS — J45909 Unspecified asthma, uncomplicated: Secondary | ICD-10-CM | POA: Insufficient documentation

## 2020-11-09 DIAGNOSIS — K432 Incisional hernia without obstruction or gangrene: Secondary | ICD-10-CM | POA: Diagnosis not present

## 2020-11-09 DIAGNOSIS — I129 Hypertensive chronic kidney disease with stage 1 through stage 4 chronic kidney disease, or unspecified chronic kidney disease: Secondary | ICD-10-CM | POA: Insufficient documentation

## 2020-11-09 DIAGNOSIS — K5730 Diverticulosis of large intestine without perforation or abscess without bleeding: Secondary | ICD-10-CM | POA: Diagnosis not present

## 2020-11-09 DIAGNOSIS — Z96653 Presence of artificial knee joint, bilateral: Secondary | ICD-10-CM | POA: Insufficient documentation

## 2020-11-09 DIAGNOSIS — Z886 Allergy status to analgesic agent status: Secondary | ICD-10-CM | POA: Diagnosis not present

## 2020-11-09 DIAGNOSIS — R110 Nausea: Secondary | ICD-10-CM | POA: Insufficient documentation

## 2020-11-09 DIAGNOSIS — Z20822 Contact with and (suspected) exposure to covid-19: Secondary | ICD-10-CM | POA: Diagnosis not present

## 2020-11-09 DIAGNOSIS — Z885 Allergy status to narcotic agent status: Secondary | ICD-10-CM | POA: Diagnosis not present

## 2020-11-09 DIAGNOSIS — F419 Anxiety disorder, unspecified: Secondary | ICD-10-CM | POA: Diagnosis present

## 2020-11-09 DIAGNOSIS — I10 Essential (primary) hypertension: Secondary | ICD-10-CM | POA: Diagnosis not present

## 2020-11-09 DIAGNOSIS — Z91048 Other nonmedicinal substance allergy status: Secondary | ICD-10-CM | POA: Diagnosis not present

## 2020-11-09 DIAGNOSIS — J449 Chronic obstructive pulmonary disease, unspecified: Secondary | ICD-10-CM | POA: Insufficient documentation

## 2020-11-09 DIAGNOSIS — G8929 Other chronic pain: Secondary | ICD-10-CM | POA: Diagnosis not present

## 2020-11-09 DIAGNOSIS — E661 Drug-induced obesity: Secondary | ICD-10-CM | POA: Diagnosis not present

## 2020-11-09 DIAGNOSIS — R140 Abdominal distension (gaseous): Secondary | ICD-10-CM | POA: Diagnosis not present

## 2020-11-09 DIAGNOSIS — K5900 Constipation, unspecified: Secondary | ICD-10-CM | POA: Diagnosis present

## 2020-11-09 DIAGNOSIS — E785 Hyperlipidemia, unspecified: Secondary | ICD-10-CM | POA: Diagnosis not present

## 2020-11-09 LAB — CBC
HCT: 38.8 % (ref 36.0–46.0)
Hemoglobin: 12.2 g/dL (ref 12.0–15.0)
MCH: 24.7 pg — ABNORMAL LOW (ref 26.0–34.0)
MCHC: 31.4 g/dL (ref 30.0–36.0)
MCV: 78.7 fL — ABNORMAL LOW (ref 80.0–100.0)
Platelets: 290 10*3/uL (ref 150–400)
RBC: 4.93 MIL/uL (ref 3.87–5.11)
RDW: 15.4 % (ref 11.5–15.5)
WBC: 9.2 10*3/uL (ref 4.0–10.5)
nRBC: 0.3 % — ABNORMAL HIGH (ref 0.0–0.2)

## 2020-11-09 LAB — COMPREHENSIVE METABOLIC PANEL
ALT: 17 U/L (ref 0–44)
AST: 20 U/L (ref 15–41)
Albumin: 4.2 g/dL (ref 3.5–5.0)
Alkaline Phosphatase: 46 U/L (ref 38–126)
Anion gap: 11 (ref 5–15)
BUN: 16 mg/dL (ref 8–23)
CO2: 31 mmol/L (ref 22–32)
Calcium: 8.9 mg/dL (ref 8.9–10.3)
Chloride: 92 mmol/L — ABNORMAL LOW (ref 98–111)
Creatinine, Ser: 1.28 mg/dL — ABNORMAL HIGH (ref 0.44–1.00)
GFR, Estimated: 43 mL/min — ABNORMAL LOW (ref >60)
Glucose, Bld: 108 mg/dL — ABNORMAL HIGH (ref 70–99)
Potassium: 3.3 mmol/L — ABNORMAL LOW (ref 3.5–5.1)
Sodium: 134 mmol/L — ABNORMAL LOW (ref 135–145)
Total Bilirubin: 0.5 mg/dL (ref 0.3–1.2)
Total Protein: 7.2 g/dL (ref 6.5–8.1)

## 2020-11-09 LAB — URINALYSIS, ROUTINE W REFLEX MICROSCOPIC
Bilirubin Urine: NEGATIVE
Glucose, UA: NEGATIVE mg/dL
Ketones, ur: NEGATIVE mg/dL
Nitrite: NEGATIVE
Protein, ur: NEGATIVE mg/dL
Specific Gravity, Urine: 1.013 (ref 1.005–1.030)
pH: 6.5 (ref 5.0–8.0)

## 2020-11-09 LAB — LIPASE, BLOOD: Lipase: 15 U/L (ref 11–51)

## 2020-11-09 MED ORDER — ONDANSETRON 4 MG PO TBDP: 4.0000 mg | ORAL_TABLET | Freq: Three times a day (TID) | ORAL | 0 refills | Status: DC | PRN

## 2020-11-09 MED ORDER — ONDANSETRON 4 MG PO TBDP
4.0000 mg | ORAL_TABLET | Freq: Once | ORAL | Status: AC
  Administered 2020-11-09: 4 mg via ORAL
  Filled 2020-11-09: qty 1

## 2020-11-09 MED ORDER — OXYCODONE-ACETAMINOPHEN 5-325 MG PO TABS
1.0000 | ORAL_TABLET | Freq: Once | ORAL | Status: AC
  Administered 2020-11-09: 1 via ORAL
  Filled 2020-11-09: qty 1

## 2020-11-09 NOTE — ED Triage Notes (Signed)
Patient reports to the ER for right sided flank pain. Patient reportedly had blood in her urine. Patient reports feeling nauseated.

## 2020-11-09 NOTE — ED Provider Notes (Signed)
[facility] EMERGENCY DEPT Provider Note   CSN: [id number] Arrival date & time: 11/09/20  1729     History Chief Complaint  Patient presents with  . Nausea    Glenda Terry is a 80 y.o. female.  She was seen here in the ED 3 days ago.  She had labs and a CT scan.  She has treated her constipation with good result, but she still has some pain in her right lower quadrant.  It is very focal, and she has minimal to no other symptoms.  The history is provided by the patient.  Abdominal Pain Pain location:  RLQ Pain quality: sharp   Pain radiates to:  Does not radiate Pain severity:  Moderate Onset quality:  Gradual Duration:  1 week Timing:  Constant Progression:  Unchanged Chronicity:  New Context comment:  No known cause Relieved by:  Nothing Worsened by:  Nothing Ineffective treatments: laxatives. Associated symptoms: no anorexia, no chest pain, no chills, no cough, no dysuria, no fever, no hematuria, no melena, no nausea, no shortness of breath, no sore throat and no vomiting       Past Medical History:  Diagnosis Date  . Anxiety   . Asthma   . Back pain, chronic   . CKD (chronic kidney disease) stage 3, GFR 30-59 ml/min (HCC)   . Compression fx, thoracic spine (HCC)    T - 11  . COPD (chronic obstructive pulmonary disease) ([place]) 12/16/2015  . 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 Essential hypertension   . GERD (gastroesophageal reflux disease)   . HOH (hard of hearing)   . Inflammatory arthritis   . Iron deficiency anemia 01/2012  . 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 Spinal stenosis   . Tubular adenoma 11/2012    Patient Active Problem List   Diagnosis Date Noted  . Nausea with vomiting 01/14/2020  . Chronic bilateral low back pain 10/25/2019  . Chronic constipation 10/22/2019  . Bilateral lower extremity edema 10/22/2019  . Chronic anemia 10/22/2019  . 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 Tachycardia 10/18/2019  . Primary osteoarthritis  of knee 10/16/2019  . Primary osteoarthritis of left knee 10/15/2019  . Left lower quadrant abdominal pain 02/16/2019  . Hx of adenomatous colonic polyps 11/28/2017  . 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 Eosinophil count raised 06/09/2016  . Severe persistent asthma 06/07/2016  . COPD (chronic obstructive pulmonary disease) ([place]) 12/16/2015  . Asthma exacerbation 09/20/2015  . Acute respiratory failure ([place]) 09/20/2015  . CKD (chronic kidney disease) stage 3, GFR 30-59 ml/min (HCC) 09/20/2015  . Hemorrhoids, internal 08/21/2015  . 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 Microcytic anemia 11/01/2012  . Family hx of colon cancer 11/01/2012  . 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 Herniated lumbar intervertebral disc 04/20/2012  . Difficulty in walking(719.7) 03/13/2012  . Stiffness of joint, not elsewhere classified, lower leg 03/13/2012  . Weakness of right leg 03/13/2012  . OA (osteoarthritis) of knee 01/24/2012  . Anxiety 02/09/2011  . HTN (hypertension) 02/09/2011  . Arthritis 02/09/2011  . GERD (gastroesophageal reflux disease) 02/09/2011  . Rheumatoid arthritis ([place]) 02/09/2011    Past Surgical History:  Procedure Laterality Date  . ABDOMINAL HYSTERECTOMY    . BACK SURGERY    . CARPAL TUNNEL RELEASE Right 06/2012  . CATARACT EXTRACTION W/PHACO Left 11/19/2014   Procedure: CATARACT EXTRACTION PHACO AND INTRAOCULAR LENS PLACEMENT (IOC);  Surgeon: Williams Che, MD;  Location: [facility];  Service: Ophthalmology;  Laterality: Left;  CDE: 4.77  . CATARACT EXTRACTION W/PHACO Right 02/03/2015   Procedure: CATARACT EXTRACTION PHACO AND INTRAOCULAR LENS PLACEMENT ([place]);  Surgeon: Williams Che, MD;  Location: [facility];  Service: Ophthalmology;  Laterality: Right;  CDE: 4.54  . COLONOSCOPY  06/19/2008   RMR: tortuous and elongated colon with scattered left-sided diverticula/colonic mucosa appeared entirely normal. Prior colonic ulcers  had resolved.  . COLONOSCOPY  12/2007   Dr. Gala Romney: Scattered diffuse sigmoid diverticula, 2 areas of ulceration at the hepatic flexure. Biopsies unremarkable.  . COLONOSCOPY N/A 11/20/2012   next TCS 11/2017  . COLONOSCOPY WITH PROPOFOL N/A 12/22/2017   Procedure: COLONOSCOPY WITH PROPOFOL;  Surgeon: Daneil Dolin, MD; diverticula in the sigmoid and descending colon and 2 tubular adenomas.  No recommendations to repeat.    . DECOMPRESSIVE LUMBAR LAMINECTOMY LEVEL 2  04/20/2012   Procedure: DECOMPRESSIVE LUMBAR LAMINECTOMY LEVEL 2;  Surgeon: Tobi Bastos, MD;  Location: [facility];  Service: Orthopedics;  Laterality: Right;  Decompressive Lumbar Laminectomy of the L4 - L5 and L5 - S1 Complete/Laminectomy L5 on the Right (X-Ray)  . ESOPHAGOGASTRODUODENOSCOPY  12/2007   Dr. 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 Small hiatal hernia. Slightly pale duodenal mucosa (biopsy unremarkable)  . ESOPHAGOGASTRODUODENOSCOPY (EGD) WITH PROPOFOL N/A 01/20/2017   Dr. Gala Romney: Medium sized hiatal hernia empiric esophageal dilation for history of dysphagia  . ESOPHAGOGASTRODUODENOSCOPY (EGD) WITH PROPOFOL N/A 04/08/2020   Surgeon: Eloise Harman, DO;  medium size hiatal hernia 3 to 4 cm chicken bone lodged in the gastric antrum just proximal to the pylorus removed with a snare with mild bleeding at the site of lodged bone.  Normal examined duodenum.  Marland Kitchen EYE SURGERY     BIL CATARACTS  . FOREIGN BODY REMOVAL N/A 04/08/2020   Procedure: FOREIGN BODY REMOVAL;  Surgeon: Eloise Harman, DO;  Location[facility];  Service: Endoscopy;  Laterality: N/A;  . IR KYPHO THORACIC WITH BONE BIOPSY  02/08/2017  . IR RADIOLOGIST EVAL & MGMT  02/02/2017  . JOINT REPLACEMENT    . KNEE ARTHROSCOPY Right   . MALONEY DILATION N/A 01/20/2017   Procedure: Venia Minks DILATION;  Surgeon: Daneil Dolin, MD;  Location: [facility];  Service: Endoscopy;  Laterality: N/A;  .  POLYPECTOMY  12/22/2017   Procedure: POLYPECTOMY;  Surgeon: Daneil Dolin, MD;  Location: [facility];  Service: Endoscopy;;  . REVERSE SHOULDER ARTHROPLASTY Right 05/24/2014   Procedure: RIGHT SHOULDER REVERSE ARTHROPLASTY;  Surgeon: Netta Cedars, MD;  Location: [facility];  Service: Orthopedics;  Laterality: Right;  . REVERSE SHOULDER ARTHROPLASTY Left 06/10/2017   Procedure: LEFT REVERSE SHOULDER ARTHROPLASTY;  Surgeon: Netta Cedars, MD;  Location: [facility];  Service: Orthopedics;  Laterality: Left;  . TOTAL KNEE ARTHROPLASTY  01/24/2012   Procedure: TOTAL KNEE ARTHROPLASTY;  Surgeon: Gearlean Alf, MD;  Location: [facility];  Service: Orthopedics;  Laterality: Right;  . TOTAL KNEE ARTHROPLASTY Left 10/15/2019   Procedure: TOTAL KNEE ARTHROPLASTY;  Surgeon: Gaynelle Arabian, MD;  Location: [facility];  Service: Orthopedics;  Laterality: Left;  66mn  . TUBAL LIGATION       OB History     Gravida  3   Para  3   Term  2   Preterm  1   AB      Living  3      SAB      IAB      Ectopic  Multiple      Live Births              Family History  Problem Relation Age of Onset  . Breast cancer Sister   . Colon cancer Sister        two deceased, one living and terminal, one current undergoing treatment, ages 62, 30, 54, 90  . Hypertension Mother   . Stroke Mother   . Heart attack Mother   . Hypertension Father   . Prostate cancer Father     Social History   Tobacco Use  . Smoking status: Former    Packs/day: 2.00    Years: 40.00    Pack years: 80.00    Types: Cigarettes    Quit date: 01/19/1992    Years since quitting: 28.8  . Smokeless tobacco: Never  Vaping Use  . Vaping Use: Never used  Substance Use Topics  . Alcohol use: No  . Drug use: No    Home Medications Prior to Admission medications   Medication Sig Start Date End Date Taking? Authorizing Provider  Acetaminophen (APAP 500 PO) Take 2 tablets by mouth 2 (two) times daily as needed.    [provider]  albuterol (PROAIR HFA) 108 (90 Base) MCG/ACT inhaler Inhale 2 puffs into the lungs every 6 (six) hours as needed for wheezing or shortness of breath (For COPD).     [provider]  ALPRAZolam Duanne Moron) 0.5 MG tablet Take 0.5 tablets (0.25 mg total) by mouth 2 (two) times daily as needed for anxiety. 10/25/19   Gerlene Fee, NP  amLODipine (NORVASC) 5 MG tablet Take 1 tablet (5 mg total) by mouth daily. 06/17/20   Elouise Munroe, MD  Benralizumab 30 MG/ML SOSY Inject 30 mg into the skin. Once A Day Every 56 Days 10/17/19   [provider]  bisacodyl (DULCOLAX) 5 MG EC tablet Take 5 mg by mouth daily as needed for moderate constipation.    [provider]  calcium carbonate (CALCIUM 600) 600 MG TABS tablet Take 600 mg by mouth 2 (two) times daily with a meal.    [provider]  Cholecalciferol (VITAMIN D3) 50 MCG (2000 UT) CAPS Take 1 capsule by mouth daily.    [provider]  denosumab (PROLIA) 60 MG/ML SOSY injection Inject 60 mg into the skin every 6 (six) months. 03/01/18   [provider]  diphenhydrAMINE (BENADRYL) 25 mg capsule Take 25 mg by mouth at bedtime.    [provider]  DULERA 200-5 MCG/ACT AERO Inhale 2 puffs into the lungs 2 (two) times daily. 10/25/19   Gerlene Fee, NP  esomeprazole (NEXIUM) 40 MG capsule Take 1 capsule (40 mg total) by mouth 2 (two) times daily. 10/25/19   Gerlene Fee, NP  ferrous sulfate 325 (65 FE) MG tablet Take 1 tablet (325 mg total) by mouth daily with breakfast. 10/25/19   Gerlene Fee, NP  fexofenadine (ALLEGRA) 180 MG tablet Take 180 mg by mouth daily.    [provider]  fluticasone (FLONASE) 50 MCG/ACT nasal spray Place 1 spray into both nostrils daily as needed for allergies or rhinitis.  10/17/19   [provider]  ipratropium-albuterol (DUONEB) 0.5-2.5 (3) MG/3ML SOLN Take 3 mLs by nebulization every 4 (four) hours as needed.    [provider]  ketotifen (ZADITOR) 0.025 % ophthalmic solution Place 1 drop into both eyes 2 (two) times daily as needed (allergies). 10/25/19   Ok Edwards  S, NP  Magnesium 250 MG TABS Take 250 mg by mouth daily.    [provider]  Menthol, Topical Analgesic, (BIOFREEZE) 4 % GEL Apply 1 application topically 2 (two) times daily. Special Instructions: for back pain 10/23/19   [provider]  montelukast (SINGULAIR) 10 MG tablet Take 1 tablet (10 mg total) by mouth at bedtime. 07/25/20   Elouise Munroe, MD  nystatin (MYCOSTATIN) 100000 UNIT/ML suspension Take 5 mLs (500,000 Units total) by mouth 4 (four) times daily. 11/07/20   Tegeler, Gwenyth Allegra, MD  Oxycodone HCl 10 MG TABS Take 10 mg by mouth every 6 (six) hours. 12/29/19   [provider]  Polyethyl Glycol-Propyl Glycol (SYSTANE OP) Place 1 drop into both eyes 3 (three) times daily as needed (dry/irritated eyes.).     [provider]  polyethylene glycol (MIRALAX / GLYCOLAX) 17 g packet Take 17 g by mouth daily. 11/07/20   Tegeler, Gwenyth Allegra, MD  potassium chloride (KLOR-CON) 10 MEQ tablet Take 1 tablet (10 mEq total) by mouth every other day. IN THE MORNING 10/25/19   Gerlene Fee, NP  pravastatin (PRAVACHOL) 10 MG tablet Take 10 mg by mouth daily. 03/27/20   [provider]  torsemide (DEMADEX) 10 MG tablet Take 20 mg by mouth daily.    [provider]  vitamin C (ASCORBIC ACID) 500 MG tablet Take 500 mg by mouth daily.    [provider]    Allergies    Flexeril [cyclobenzaprine], Other, Keflex [cephalexin], Aspirin, Fentanyl, Adhesive [tape], and Chlorhexidine  Review of Systems   Review of Systems  Constitutional:  Negative for chills and fever.  HENT:  Negative for ear pain and sore throat.   Eyes:  Negative for pain and visual disturbance.  Respiratory:  Negative for cough and shortness of breath.   Cardiovascular:  Negative for chest pain and  palpitations.  Gastrointestinal:  Positive for abdominal pain. Negative for anorexia, melena, nausea and vomiting.  Genitourinary:  Negative for dysuria and hematuria.  Musculoskeletal:  Negative for arthralgias and back pain.  Skin:  Negative for color change and rash.  Neurological:  Negative for seizures and syncope.  All other systems reviewed and are negative.  Physical Exam Updated Vital Signs BP 138/65   Pulse 71   Temp 98.3 F (36.8 C)   Resp 18   Ht 5' (1.524 m)   Wt 84 kg   SpO2 98%   BMI 36.17 kg/m   Physical Exam Vitals and nursing note reviewed.  HENT:     Head: Normocephalic and atraumatic.  Eyes:     General: No scleral icterus. Pulmonary:     Effort: Pulmonary effort is normal. No respiratory distress.  Abdominal:     General: Bowel sounds are normal. There is no distension.     Palpations: Abdomen is soft.     Comments: Very focal, almost pinpoint tenderness of the RLQ with no guarding or rebound. No mass or hernia palpated.  Musculoskeletal:     Cervical back: Normal range of motion.  Skin:    General: Skin is warm and dry.  Neurological:     General: No focal deficit present.     Mental Status: She is alert and oriented to person, place, and time.  Psychiatric:        Mood and Affect: Mood normal.    ED Results / Procedures / Treatments   Labs (all labs ordered are listed, but only abnormal results are displayed) Labs Reviewed  COMPREHENSIVE METABOLIC PANEL - Abnormal; Notable for the following components:      Result Value   Sodium 134 (*)    Potassium 3.3 (*)    Chloride 92 (*)    Glucose, Bld 108 (*)    Creatinine, Ser 1.28 (*)    GFR, Estimated 43 (*)    All other components within normal limits  CBC - Abnormal; Notable for the following components:   MCV 78.7 (*)    MCH 24.7 (*)    nRBC 0.3 (*)    All other components within normal limits  URINALYSIS, ROUTINE W REFLEX MICROSCOPIC - Abnormal; Notable for the following components:    Hgb urine dipstick SMALL (*)    Leukocytes,Ua TRACE (*)    Bacteria, UA RARE (*)    All other components within normal limits  URINE CULTURE  LIPASE, BLOOD    EKG None  Radiology CT Renal Stone Study  Result Date: 11/09/2020 CLINICAL DATA:  Concern for kidney stone. EXAM: CT ABDOMEN AND PELVIS WITHOUT CONTRAST TECHNIQUE: Multidetector CT imaging of the abdomen and pelvis was performed following the standard protocol without IV contrast. COMPARISON:  CT abdomen pelvis dated 11/07/2020. FINDINGS: Evaluation of this exam is limited in the absence of intravenous contrast. Lower chest: The visualized lung bases are clear. No intra-abdominal free air or free fluid. Hepatobiliary: Faint ill-defined 15 mm hypodense lesion in the posterior right lobe of the liver (10/2) is not characterized. The liver is otherwise unremarkable. No intrahepatic biliary ductal dilatation. The gallbladder is unremarkable. Pancreas: Unremarkable. No pancreatic ductal dilatation or surrounding inflammatory changes. Spleen: Normal in size without focal abnormality. Adrenals/Urinary Tract: The adrenal glands are unremarkable. The kidneys, visualized ureters, and urinary bladder appear unremarkable. Stomach/Bowel: There is moderate size hiatal hernia. There is no bowel obstruction or active inflammation. There is sigmoid diverticulosis without active inflammatory changes. The appendix is normal. Vascular/Lymphatic: Moderate aortoiliac atherosclerotic disease. The IVC is unremarkable. No portal venous gas. There is no adenopathy Reproductive: Hysterectomy.  No adnexal masses. Other: None Musculoskeletal: Osteopenia with scoliosis and degenerative changes of the spine. T11 vertebroplasty. No acute osseous pathology. IMPRESSION: 1. No acute intra-abdominal or pelvic pathology. No hydronephrosis or nephrolithiasis. 2. Sigmoid diverticulosis. No bowel obstruction. Normal appendix. 3. Moderate size hiatal hernia. 4. Aortic Atherosclerosis  (ICD10-I70.0). Electronically Signed   By: Anner Crete M.D.   On: 11/09/2020 19:28    Procedures Procedures   Medications Ordered in ED Medications  oxyCODONE-acetaminophen (PERCOCET/ROXICET) 5-325 MG per tablet 1 tablet (has no administration in time range)    ED Course  I have reviewed the triage vital signs and the nursing notes.  Pertinent labs & imaging results that were available during my care of the patient were reviewed by me and considered in my medical decision making (see chart for details).    MDM Rules/Calculators/A&P                           ASHTAN PAYNTER presents with RLQ pain. No obvious cause for symptoms. Urinalysis is equivocal for infection, but she has no symptoms. Culture will be obtained.  She is well-appearing in general and able to eat.  She has been referred to her GI doctor for further evaluation. Final Clinical Impression(s) / ED Diagnoses Final diagnoses:  Right lower quadrant abdominal pain    Rx / DC Orders ED Discharge Orders     None        Arnaldo Natal,  MD 11/09/20 2232

## 2020-11-09 NOTE — ED Notes (Addendum)
Pt given graham cracker prior to giving pain med because she has not eaten today. Ginger ale also given.  Pt states she feels a little nauseated, spoke with MD and order received for Zofran. Pt is not vomiting.

## 2020-11-10 ENCOUNTER — Telehealth: Payer: Self-pay | Admitting: Internal Medicine

## 2020-11-10 NOTE — Telephone Encounter (Signed)
PATIENT NEEDS TO BE CALLED    IS HAVING ABDOMINAL PAIN AND SHE THINKS IT IS FROM EATING LETTUCE

## 2020-11-10 NOTE — Telephone Encounter (Signed)
Pt called stating that she has been to the ER twice due to rt side pain. Pt states that they did a CT and it was clear. ER recommended that she see GI. We do not have an opening until Jan. Pt has been placed on the cancellation list. Pt is also seeing pain mgmt and states that the pain is unbearable and she is needing something to be done. Pt is not having any fever, nausea, constipation, diarrhea or rectal bleeding at this time.

## 2020-11-10 NOTE — Telephone Encounter (Signed)
Pt was able to get in with Magda Paganini for tomorrow.

## 2020-11-11 ENCOUNTER — Encounter: Payer: Self-pay | Admitting: Internal Medicine

## 2020-11-11 ENCOUNTER — Inpatient Hospital Stay (HOSPITAL_COMMUNITY)
Admission: AD | Admit: 2020-11-11 | Discharge: 2020-11-13 | DRG: 354 | Disposition: A | Discharge status: Home / Self Care | Payer: PPO | Source: Ambulatory Visit | Attending: General Surgery | Admitting: General Surgery

## 2020-11-11 ENCOUNTER — Ambulatory Visit: Payer: PPO | Admitting: Internal Medicine

## 2020-11-11 ENCOUNTER — Inpatient Hospital Stay (HOSPITAL_COMMUNITY): Payer: PPO

## 2020-11-11 ENCOUNTER — Other Ambulatory Visit: Payer: Self-pay

## 2020-11-11 ENCOUNTER — Ambulatory Visit: Payer: PPO | Admitting: Gastroenterology

## 2020-11-11 VITALS — BP 134/88 | HR 110 | Temp 97.0°F | Ht 60.0 in | Wt 182.2 lb

## 2020-11-11 DIAGNOSIS — Z87891 Personal history of nicotine dependence: Secondary | ICD-10-CM

## 2020-11-11 DIAGNOSIS — N183 Chronic kidney disease, stage 3 unspecified: Secondary | ICD-10-CM | POA: Diagnosis present

## 2020-11-11 DIAGNOSIS — E661 Drug-induced obesity: Secondary | ICD-10-CM | POA: Diagnosis not present

## 2020-11-11 DIAGNOSIS — Z96612 Presence of left artificial shoulder joint: Secondary | ICD-10-CM | POA: Diagnosis present

## 2020-11-11 DIAGNOSIS — Z20822 Contact with and (suspected) exposure to covid-19: Secondary | ICD-10-CM | POA: Diagnosis present

## 2020-11-11 DIAGNOSIS — Z8249 Family history of ischemic heart disease and other diseases of the circulatory system: Secondary | ICD-10-CM

## 2020-11-11 DIAGNOSIS — Z91048 Other nonmedicinal substance allergy status: Secondary | ICD-10-CM | POA: Diagnosis not present

## 2020-11-11 DIAGNOSIS — I129 Hypertensive chronic kidney disease with stage 1 through stage 4 chronic kidney disease, or unspecified chronic kidney disease: Secondary | ICD-10-CM | POA: Diagnosis present

## 2020-11-11 DIAGNOSIS — K5900 Constipation, unspecified: Secondary | ICD-10-CM | POA: Diagnosis present

## 2020-11-11 DIAGNOSIS — G8929 Other chronic pain: Secondary | ICD-10-CM | POA: Diagnosis present

## 2020-11-11 DIAGNOSIS — Z79891 Long term (current) use of opiate analgesic: Secondary | ICD-10-CM

## 2020-11-11 DIAGNOSIS — R1031 Right lower quadrant pain: Secondary | ICD-10-CM | POA: Diagnosis not present

## 2020-11-11 DIAGNOSIS — F419 Anxiety disorder, unspecified: Secondary | ICD-10-CM | POA: Diagnosis present

## 2020-11-11 DIAGNOSIS — Z886 Allergy status to analgesic agent status: Secondary | ICD-10-CM

## 2020-11-11 DIAGNOSIS — K432 Incisional hernia without obstruction or gangrene: Secondary | ICD-10-CM | POA: Diagnosis present

## 2020-11-11 DIAGNOSIS — Z91018 Allergy to other foods: Secondary | ICD-10-CM

## 2020-11-11 DIAGNOSIS — J8289 Other pulmonary eosinophilia, not elsewhere classified: Secondary | ICD-10-CM | POA: Diagnosis present

## 2020-11-11 DIAGNOSIS — K219 Gastro-esophageal reflux disease without esophagitis: Secondary | ICD-10-CM | POA: Diagnosis present

## 2020-11-11 DIAGNOSIS — E785 Hyperlipidemia, unspecified: Secondary | ICD-10-CM | POA: Diagnosis present

## 2020-11-11 DIAGNOSIS — Z96653 Presence of artificial knee joint, bilateral: Secondary | ICD-10-CM | POA: Diagnosis present

## 2020-11-11 DIAGNOSIS — H9190 Unspecified hearing loss, unspecified ear: Secondary | ICD-10-CM | POA: Diagnosis present

## 2020-11-11 DIAGNOSIS — Z6835 Body mass index (BMI) 35.0-35.9, adult: Secondary | ICD-10-CM

## 2020-11-11 DIAGNOSIS — Z90710 Acquired absence of both cervix and uterus: Secondary | ICD-10-CM

## 2020-11-11 DIAGNOSIS — J449 Chronic obstructive pulmonary disease, unspecified: Secondary | ICD-10-CM | POA: Diagnosis present

## 2020-11-11 DIAGNOSIS — E249 Cushing's syndrome, unspecified: Secondary | ICD-10-CM | POA: Diagnosis present

## 2020-11-11 DIAGNOSIS — Z01818 Encounter for other preprocedural examination: Secondary | ICD-10-CM

## 2020-11-11 DIAGNOSIS — E669 Obesity, unspecified: Secondary | ICD-10-CM | POA: Diagnosis present

## 2020-11-11 DIAGNOSIS — Z79899 Other long term (current) drug therapy: Secondary | ICD-10-CM

## 2020-11-11 DIAGNOSIS — M064 Inflammatory polyarthropathy: Secondary | ICD-10-CM | POA: Diagnosis present

## 2020-11-11 DIAGNOSIS — Z885 Allergy status to narcotic agent status: Secondary | ICD-10-CM

## 2020-11-11 DIAGNOSIS — R109 Unspecified abdominal pain: Secondary | ICD-10-CM | POA: Diagnosis present

## 2020-11-11 LAB — CREATININE, SERUM
Creatinine, Ser: 1.65 mg/dL — ABNORMAL HIGH (ref 0.44–1.00)
GFR, Estimated: 32 mL/min — ABNORMAL LOW (ref >60)

## 2020-11-11 LAB — CBC
HCT: 41.5 % (ref 36.0–46.0)
Hemoglobin: 13 g/dL (ref 12.0–15.0)
MCH: 25.4 pg — ABNORMAL LOW (ref 26.0–34.0)
MCHC: 31.3 g/dL (ref 30.0–36.0)
MCV: 81.2 fL (ref 80.0–100.0)
Platelets: 299 10*3/uL (ref 150–400)
RBC: 5.11 MIL/uL (ref 3.87–5.11)
RDW: 15.4 % (ref 11.5–15.5)
WBC: 9.8 10*3/uL (ref 4.0–10.5)
nRBC: 0 % (ref 0.0–0.2)

## 2020-11-11 LAB — RESP PANEL BY RT-PCR (FLU A&B, COVID) ARPGX2
Influenza A by PCR: NEGATIVE
Influenza B by PCR: NEGATIVE
SARS Coronavirus 2 by RT PCR: NEGATIVE

## 2020-11-11 MED ORDER — PRAVASTATIN SODIUM 10 MG PO TABS
10.0000 mg | ORAL_TABLET | Freq: Every day | ORAL | Status: DC
  Administered 2020-11-12: 10 mg via ORAL
  Filled 2020-11-11 (×2): qty 1

## 2020-11-11 MED ORDER — IPRATROPIUM-ALBUTEROL 0.5-2.5 (3) MG/3ML IN SOLN: 3.0000 mL | Freq: Four times a day (QID) | RESPIRATORY_TRACT | Status: DC | PRN

## 2020-11-11 MED ORDER — KETOTIFEN FUMARATE 0.025 % OP SOLN: 1.0000 [drp] | Freq: Two times a day (BID) | OPHTHALMIC | Status: DC | PRN

## 2020-11-11 MED ORDER — SODIUM CHLORIDE 0.9 % IV SOLN: INTRAVENOUS | Status: DC

## 2020-11-11 MED ORDER — ALPRAZOLAM 0.25 MG PO TABS
0.2500 mg | ORAL_TABLET | Freq: Two times a day (BID) | ORAL | Status: DC | PRN
  Administered 2020-11-11 – 2020-11-13 (×2): 0.25 mg via ORAL
  Filled 2020-11-11 (×2): qty 1

## 2020-11-11 MED ORDER — ASCORBIC ACID 500 MG PO TABS
500.0000 mg | ORAL_TABLET | Freq: Every day | ORAL | Status: DC
  Administered 2020-11-13: 500 mg via ORAL
  Filled 2020-11-11 (×2): qty 1

## 2020-11-11 MED ORDER — HEPARIN SODIUM (PORCINE) 5000 UNIT/ML IJ SOLN
5000.0000 [IU] | Freq: Three times a day (TID) | INTRAMUSCULAR | Status: DC
  Administered 2020-11-12 – 2020-11-13 (×3): 5000 [IU] via SUBCUTANEOUS
  Filled 2020-11-11 (×4): qty 1

## 2020-11-11 MED ORDER — ACETAMINOPHEN 325 MG PO TABS: 650.0000 mg | ORAL_TABLET | Freq: Four times a day (QID) | ORAL | Status: DC | PRN

## 2020-11-11 MED ORDER — FLUTICASONE PROPIONATE 50 MCG/ACT NA SUSP: 1.0000 | Freq: Every day | NASAL | Status: DC | PRN

## 2020-11-11 MED ORDER — MOMETASONE FURO-FORMOTEROL FUM 200-5 MCG/ACT IN AERO
2.0000 | INHALATION_SPRAY | Freq: Two times a day (BID) | RESPIRATORY_TRACT | Status: DC
  Administered 2020-11-11 – 2020-11-13 (×4): 2 via RESPIRATORY_TRACT
  Filled 2020-11-11: qty 8.8

## 2020-11-11 MED ORDER — ONDANSETRON HCL 4 MG/2ML IJ SOLN
4.0000 mg | Freq: Four times a day (QID) | INTRAMUSCULAR | Status: DC | PRN
  Administered 2020-11-11 – 2020-11-12 (×2): 4 mg via INTRAVENOUS
  Filled 2020-11-11 (×2): qty 2

## 2020-11-11 MED ORDER — PANTOPRAZOLE SODIUM 40 MG PO TBEC
40.0000 mg | DELAYED_RELEASE_TABLET | Freq: Two times a day (BID) | ORAL | Status: DC
  Administered 2020-11-11 – 2020-11-13 (×3): 40 mg via ORAL
  Filled 2020-11-11 (×4): qty 1

## 2020-11-11 MED ORDER — MORPHINE SULFATE (PF) 4 MG/ML IV SOLN
3.0000 mg | INTRAVENOUS | Status: DC | PRN
Start: 2020-11-11 — End: 2020-11-13
  Administered 2020-11-11 – 2020-11-12 (×6): 3 mg via INTRAVENOUS
  Filled 2020-11-11 (×6): qty 1

## 2020-11-11 MED ORDER — MONTELUKAST SODIUM 10 MG PO TABS
10.0000 mg | ORAL_TABLET | Freq: Every day | ORAL | Status: DC
  Administered 2020-11-11 – 2020-11-12 (×2): 10 mg via ORAL
  Filled 2020-11-11 (×2): qty 1

## 2020-11-11 MED ORDER — KCL IN DEXTROSE-NACL 40-5-0.45 MEQ/L-%-% IV SOLN: INTRAVENOUS | Status: DC

## 2020-11-11 MED ORDER — AMLODIPINE BESYLATE 5 MG PO TABS
5.0000 mg | ORAL_TABLET | Freq: Every day | ORAL | Status: DC
  Administered 2020-11-13: 5 mg via ORAL
  Filled 2020-11-11 (×2): qty 1

## 2020-11-11 MED ORDER — ACETAMINOPHEN 650 MG RE SUPP: 650.0000 mg | Freq: Four times a day (QID) | RECTAL | Status: DC | PRN

## 2020-11-11 MED ORDER — ONDANSETRON HCL 4 MG PO TABS
4.0000 mg | ORAL_TABLET | Freq: Four times a day (QID) | ORAL | Status: DC | PRN
  Administered 2020-11-12 – 2020-11-13 (×2): 4 mg via ORAL
  Filled 2020-11-11 (×2): qty 1

## 2020-11-11 MED ORDER — DICYCLOMINE HCL 10 MG PO CAPS: 10.0000 mg | ORAL_CAPSULE | Freq: Three times a day (TID) | ORAL | Status: DC | PRN

## 2020-11-11 NOTE — Consult Note (Signed)
Reason for Consult: Lower abdominal pain Referring Physician: Dr. Sadie Haber is an 80 y.o. female.  HPI: Patient is a 80 year old black female with multiple medical problems who has been in the emergency room twice for evaluation treatment of right lower quadrant abdominal pain.  She has had multiple CTs in the emergency room and was only found to have an umbilical hernia.  She was being seen by Dr. Gala Romney earlier today in his office and she was having excruciating lower abdominal pain which was crampy in nature.  The patient was admitted to the hospital for further evaluation and treatment.  She states that her pain has been intermittent for some time now.  Nothing seems to instigate the pain.  She has had multiple surgeries through her umbilicus in the past.  She has not noticed a mass in the lower abdomen.  She denies any nausea or vomiting.  She states she is currently hungry.  She has been constipated, requiring cathartics.  She denies any fever or chills.  Past Medical History:  Diagnosis Date   Anxiety    Asthma    Back pain, chronic    CKD (chronic kidney disease) stage 3, GFR 30-59 ml/min (HCC)    Compression fx, thoracic spine (HCC)    T - 11   COPD (chronic obstructive pulmonary disease) ([place]) 12/16/2015   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HCC)    Essential hypertension    GERD (gastroesophageal reflux disease)    HOH (hard of hearing)    Inflammatory arthritis    Iron deficiency anemia 01/2012   Pulmonary eosinophilia (HCC)    Spinal stenosis    Tubular adenoma 11/2012    Past Surgical History:  Procedure Laterality Date   ABDOMINAL HYSTERECTOMY     BACK SURGERY     CARPAL TUNNEL RELEASE Right 06/2012   CATARACT EXTRACTION W/PHACO Left 11/19/2014   Procedure: CATARACT EXTRACTION PHACO AND INTRAOCULAR LENS PLACEMENT ([place]);  Surgeon: Williams Che, MD;  Location: [facility];  Service: Ophthalmology;  Laterality: Left;  CDE: 4.77   CATARACT EXTRACTION W/PHACO Right 02/03/2015    Procedure: CATARACT EXTRACTION PHACO AND INTRAOCULAR LENS PLACEMENT ([facility]);  Surgeon: Williams Che, MD;  Location: [facility];  Service: Ophthalmology;  Laterality: Right;  CDE: 4.54   COLONOSCOPY  06/19/2008   RMR: tortuous and elongated colon with scattered left-sided diverticula/colonic mucosa appeared entirely normal. Prior colonic ulcers had resolved.   COLONOSCOPY  12/2007   Dr. Gala Romney: Scattered diffuse sigmoid diverticula, 2 areas of ulceration at the hepatic flexure. Biopsies unremarkable.   COLONOSCOPY N/A 11/20/2012   next TCS 11/2017   COLONOSCOPY WITH PROPOFOL N/A 12/22/2017   Procedure: COLONOSCOPY WITH PROPOFOL;  Surgeon: Daneil Dolin, MD; diverticula in the sigmoid and descending colon and 2 tubular adenomas.  No recommendations to repeat.     DECOMPRESSIVE LUMBAR LAMINECTOMY LEVEL 2  04/20/2012   Procedure: DECOMPRESSIVE LUMBAR LAMINECTOMY LEVEL 2;  Surgeon: Tobi Bastos, MD;  Location: [facility];  Service: Orthopedics;  Laterality: Right;  Decompressive Lumbar Laminectomy of the L4 - L5 and L5 - S1 Complete/Laminectomy L5 on the Right (X-Ray)   ESOPHAGOGASTRODUODENOSCOPY  12/2007   Dr. 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 Small hiatal hernia. Slightly pale duodenal mucosa (biopsy unremarkable)   ESOPHAGOGASTRODUODENOSCOPY (EGD) WITH PROPOFOL N/A 01/20/2017   Dr. Gala Romney: Medium sized hiatal hernia empiric esophageal dilation for history of dysphagia   ESOPHAGOGASTRODUODENOSCOPY (EGD) WITH PROPOFOL N/A 04/08/2020   Surgeon: Hurshel Keys  K, DO;  medium size hiatal hernia 3 to 4 cm chicken bone lodged in the gastric antrum just proximal to the pylorus removed with a snare with mild bleeding at the site of lodged bone.  Normal examined duodenum.   EYE SURGERY     BIL CATARACTS   FOREIGN BODY REMOVAL N/A 04/08/2020   Procedure: FOREIGN BODY REMOVAL;  Surgeon: Eloise Harman, DO;  [facility];  Service:  Endoscopy;  Laterality: N/A;   IR KYPHO THORACIC WITH BONE BIOPSY  02/08/2017   IR RADIOLOGIST EVAL & MGMT  02/02/2017   JOINT REPLACEMENT     KNEE ARTHROSCOPY Right    MALONEY DILATION N/A 01/20/2017   Procedure: MALONEY DILATION;  Surgeon: Daneil Dolin, MD;  [facility];  Service: Endoscopy;  Laterality: N/A;   POLYPECTOMY  12/22/2017   Procedure: POLYPECTOMY;  Surgeon: Daneil Dolin, MD;  [facility];  Service: Endoscopy;;   REVERSE SHOULDER ARTHROPLASTY Right 05/24/2014   Procedure: RIGHT SHOULDER REVERSE ARTHROPLASTY;  Surgeon: Netta Cedars, MD;  Location: [facility];  Service: Orthopedics;  Laterality: Right;   REVERSE SHOULDER ARTHROPLASTY Left 06/10/2017   Procedure: LEFT REVERSE SHOULDER ARTHROPLASTY;  Surgeon: Netta Cedars, MD;  Location: [facility];  Service: Orthopedics;  Laterality: Left;   TOTAL KNEE ARTHROPLASTY  01/24/2012   Procedure: TOTAL KNEE ARTHROPLASTY;  Surgeon: Gearlean Alf, MD;  Location: [facility];  Service: Orthopedics;  Laterality: Right;   TOTAL KNEE ARTHROPLASTY Left 10/15/2019   Procedure: TOTAL KNEE ARTHROPLASTY;  Surgeon: Gaynelle Arabian, MD;  Location: [facility];  Service: Orthopedics;  Laterality: Left;  32mn   TUBAL LIGATION      Family History  Problem Relation Age of Onset   Breast cancer Sister    Colon cancer Sister        two deceased, one living and terminal, one current undergoing treatment, ages [phone]  Hypertension Mother    Stroke Mother    Heart attack Mother    Hypertension Father    Prostate cancer Father     Social History:  reports that she quit smoking about 28 years ago. Her smoking use included cigarettes. She has a 80.00 pack-year smoking history. She has never used smokeless tobacco. She reports that she does not drink alcohol and does not use drugs.  Allergies:  Allergies  Allergen Reactions   Flexeril [Cyclobenzaprine] Anaphylaxis   Other Anaphylaxis and Other (See Comments)    ALL TREE NUTS    Keflex [Cephalexin] Itching   Aspirin Other (See Comments)    Cannot take due to ulcers   Fentanyl Other (See Comments)    Lip Swelling   Adhesive [Tape] Rash   Chlorhexidine Rash    Medications: Prior to Admission:  Medications Prior to Admission  Medication Sig Dispense Refill Last Dose   Acetaminophen (APAP 500 PO) Take 2 tablets by mouth 2 (two) times daily as needed.      albuterol (PROAIR HFA) 108 (90 Base) MCG/ACT inhaler Inhale 2 puffs into the lungs every 6 (six) hours as needed for wheezing or shortness of breath (For COPD).       ALPRAZolam (XANAX) 0.5 MG tablet Take 0.5 tablets (0.25 mg total) by mouth 2 (two) times daily as needed for anxiety. 10 tablet 0    amLODipine (NORVASC) 5 MG tablet Take 1 tablet (5 mg total) by mouth daily. 30 tablet 3    Benralizumab 30 MG/ML SOSY Inject 30 mg into the  skin. Once A Day Every 56 Days      bisacodyl (DULCOLAX) 5 MG EC tablet Take 5 mg by mouth daily as needed for moderate constipation.      calcium carbonate (OS-CAL) 600 MG TABS tablet Take 600 mg by mouth 2 (two) times daily with a meal.      Cholecalciferol (VITAMIN D3) 50 MCG (2000 UT) CAPS Take 1 capsule by mouth daily.      denosumab (PROLIA) 60 MG/ML SOSY injection Inject 60 mg into the skin every 6 (six) months.      diphenhydrAMINE (BENADRYL) 25 mg capsule Take 25 mg by mouth at bedtime.      DULERA 200-5 MCG/ACT AERO Inhale 2 puffs into the lungs 2 (two) times daily. 8.8 g 0    esomeprazole (NEXIUM) 40 MG capsule Take 1 capsule (40 mg total) by mouth 2 (two) times daily. 60 capsule 0    ferrous sulfate 325 (65 FE) MG tablet Take 1 tablet (325 mg total) by mouth daily with breakfast. 30 tablet 0    fexofenadine (ALLEGRA) 180 MG tablet Take 180 mg by mouth daily.      fluticasone (FLONASE) 50 MCG/ACT nasal spray Place 1 spray into both nostrils daily as needed for allergies or rhinitis.       ipratropium-albuterol (DUONEB) 0.5-2.5 (3) MG/3ML SOLN Take 3 mLs by nebulization every  4 (four) hours as needed.      ketotifen (ZADITOR) 0.025 % ophthalmic solution Place 1 drop into both eyes 2 (two) times daily as needed (allergies). 5 mL 0    Magnesium 250 MG TABS Take 250 mg by mouth daily.      Menthol, Topical Analgesic, (BIOFREEZE) 4 % GEL Apply 1 application topically 2 (two) times daily. Special Instructions: for back pain      montelukast (SINGULAIR) 10 MG tablet Take 1 tablet (10 mg total) by mouth at bedtime. 90 tablet 2    nystatin (MYCOSTATIN) 100000 UNIT/ML suspension Take 5 mLs (500,000 Units total) by mouth 4 (four) times daily. 60 mL 0    ondansetron (ZOFRAN ODT) 4 MG disintegrating tablet Take 1 tablet (4 mg total) by mouth every 8 (eight) hours as needed for nausea or vomiting. 20 tablet 0    Oxycodone HCl 10 MG TABS Take 10 mg by mouth every 6 (six) hours.      Polyethyl Glycol-Propyl Glycol (SYSTANE OP) Place 1 drop into both eyes 3 (three) times daily as needed (dry/irritated eyes.).       polyethylene glycol (MIRALAX / GLYCOLAX) 17 g packet Take 17 g by mouth daily. 14 each 0    potassium chloride (KLOR-CON) 10 MEQ tablet Take 1 tablet (10 mEq total) by mouth every other day. IN THE MORNING 15 tablet 0    pravastatin (PRAVACHOL) 10 MG tablet Take 10 mg by mouth daily.      torsemide (DEMADEX) 10 MG tablet Take 20 mg by mouth daily.      vitamin C (ASCORBIC ACID) 500 MG tablet Take 500 mg by mouth daily.       Results for orders placed or performed during the hospital encounter of 11/11/20 (from the past 48 hour(s))  CBC     Status: Abnormal   Collection Time: 11/11/20  2:27 PM  Result Value Ref Range   WBC 9.8 4.0 - 10.5 K/uL   RBC 5.11 3.87 - 5.11 MIL/uL   Hemoglobin 13.0 12.0 - 15.0 g/dL   HCT 41.5 36.0 - 46.0 %   MCV  81.2 80.0 - 100.0 fL   MCH 25.4 (L) 26.0 - 34.0 pg   MCHC 31.3 30.0 - 36.0 g/dL   RDW 15.4 11.5 - 15.5 %   Platelets 299 150 - 400 K/uL   nRBC 0.0 0.0 - 0.2 %    Comment: Performed at [facility], [address] [place],  [place] [address]  Creatinine, serum     Status: Abnormal   Collection Time: 11/11/20  2:27 PM  Result Value Ref Range   Creatinine, Ser 1.65 (H) 0.44 - 1.00 mg/dL   GFR, Estimated 32 (L) >60 mL/min    Comment: (NOTE) Calculated using the CKD-EPI Creatinine Equation (2021) Performed at [facility], [address] [place], [place] [address]   Resp Panel by RT-PCR (Flu A&B, Covid) Nasopharyngeal Swab     Status: None   Collection Time: 11/11/20  4:30 PM   Specimen: Nasopharyngeal Swab; Nasopharyngeal(NP) swabs in vial transport medium  Result Value Ref Range   SARS Coronavirus 2 by RT PCR NEGATIVE NEGATIVE    Comment: (NOTE) SARS-CoV-2 target nucleic acids are NOT DETECTED.  The SARS-CoV-2 RNA is generally detectable in upper respiratory specimens during the acute phase of infection. The lowest concentration of SARS-CoV-2 viral copies this assay can detect is 138 copies/mL. A negative result does not preclude SARS-Cov-2 infection and should not be used as the sole basis for treatment or other patient management decisions. A negative result may occur with  improper specimen collection/handling, submission of specimen other than nasopharyngeal swab, presence of viral mutation(s) within the areas targeted by this assay, and inadequate number of viral copies(<138 copies/mL). A negative result must be combined with clinical observations, patient history, and epidemiological information. The expected result is Negative.  Fact Sheet for Patients:  EntrepreneurPulse.com.au  Fact Sheet for Healthcare Providers:  IncredibleEmployment.be  This test is no t yet approved or cleared by the [place] FDA and  has been authorized for detection and/or diagnosis of SARS-CoV-2 by FDA under an Emergency Use Authorization (EUA). This EUA will remain  in effect (meaning this test can be used) for the duration of the COVID-19 declaration under Section 564(b)(1) of the  Act, 21 U.S.C.section 360bbb-3(b)(1), unless the authorization is terminated  or revoked sooner.       Influenza A by PCR NEGATIVE NEGATIVE   Influenza B by PCR NEGATIVE NEGATIVE    Comment: (NOTE) The Xpert Xpress SARS-CoV-2/FLU/RSV plus assay is intended as an aid in the diagnosis of influenza from Nasopharyngeal swab specimens and should not be used as a sole basis for treatment. Nasal washings and aspirates are unacceptable for Xpert Xpress SARS-CoV-2/FLU/RSV testing.  Fact Sheet for Patients: EntrepreneurPulse.com.au  Fact Sheet for Healthcare Providers: IncredibleEmployment.be  This test is not yet approved or cleared by the [place] FDA and has been authorized for detection and/or diagnosis of SARS-CoV-2 by FDA under an Emergency Use Authorization (EUA). This EUA will remain in effect (meaning this test can be used) for the duration of the COVID-19 declaration under Section 564(b)(1) of the Act, 21 U.S.C. section 360bbb-3(b)(1), unless the authorization is terminated or revoked.  Performed at [facility], [address] [place], [place] [address]     CT Renal Stone Study  Result Date: 11/09/2020 CLINICAL DATA:  Concern for kidney stone. EXAM: CT ABDOMEN AND PELVIS WITHOUT CONTRAST TECHNIQUE: Multidetector CT imaging of the abdomen and pelvis was performed following the standard protocol without IV contrast. COMPARISON:  CT abdomen pelvis dated 11/07/2020. FINDINGS: Evaluation of this  exam is limited in the absence of intravenous contrast. Lower chest: The visualized lung bases are clear. No intra-abdominal free air or free fluid. Hepatobiliary: Faint ill-defined 15 mm hypodense lesion in the posterior right lobe of the liver (10/2) is not characterized. The liver is otherwise unremarkable. No intrahepatic biliary ductal dilatation. The gallbladder is unremarkable. Pancreas: Unremarkable. No pancreatic ductal dilatation or surrounding  inflammatory changes. Spleen: Normal in size without focal abnormality. Adrenals/Urinary Tract: The adrenal glands are unremarkable. The kidneys, visualized ureters, and urinary bladder appear unremarkable. Stomach/Bowel: There is moderate size hiatal hernia. There is no bowel obstruction or active inflammation. There is sigmoid diverticulosis without active inflammatory changes. The appendix is normal. Vascular/Lymphatic: Moderate aortoiliac atherosclerotic disease. The IVC is unremarkable. No portal venous gas. There is no adenopathy Reproductive: Hysterectomy.  No adnexal masses. Other: None Musculoskeletal: Osteopenia with scoliosis and degenerative changes of the spine. T11 vertebroplasty. No acute osseous pathology. IMPRESSION: 1. No acute intra-abdominal or pelvic pathology. No hydronephrosis or nephrolithiasis. 2. Sigmoid diverticulosis. No bowel obstruction. Normal appendix. 3. Moderate size hiatal hernia. 4. Aortic Atherosclerosis (ICD10-I70.0). Electronically Signed   By: Anner Crete M.D.   On: 11/09/2020 19:28    ROS:  Pertinent items are noted in HPI.  Blood pressure 125/70, pulse (!) 103, temperature 98.9 F (37.2 C), temperature source Oral, resp. rate 18, SpO2 100 %. Physical Exam: Anxious black female who has waves of lower abdominal pain causing her mild distress. Head is normocephalic, atraumatic Lung sounds are distant but without wheezing Heart examination reveals regular rate and rhythm without S3, S4, murmurs Abdomen is soft with migratory tenderness present.  When I pushed on her umbilical hernia, she stated that this made her pain worse.  It seemed to reproduce her pain.  This was also witnessed by her family members.  Past ER visits reviewed.  CT scan images personally reviewed  Assessment/Plan: Impression: Abdominal pain possibly secondary to an incisional hernia.  This did reproduce the pain.  In reviewing her CT scans, she does not have evidence of any  intra-abdominal pathology that would explain her pain otherwise.  She has had a surgical incision through her umbilicus in the past. Plan: We will proceed with an incisional herniorrhaphy with mesh tomorrow.  The risks and benefits of the procedure including bleeding, infection, mesh use, cardiopulmonary difficulties, and the possibility of recurrence of the pain were fully explained to the patient and family, who gave informed consent.  Aviva Signs 11/11/2020, 5:43 PM

## 2020-11-11 NOTE — Progress Notes (Signed)
Patient placed on Airborne/contact isolation per Infection control policy. Patient swabbed for Covid and awaiting results. Patient and family educated on Airborne/contact isolation verbalized understanding carenotes provided.

## 2020-11-11 NOTE — Patient Instructions (Signed)
It is recommended you go to the hospital to be admitted for pain control and further evaluation as to the cause of your abdominal pain  You will be admitted to Dr. Manuella Ghazi, the hospitalist service  Dr. Arnoldo Morale the general surgery will come by and see you.  As soon as a bed becomes available, you should go directly to the hospital  Further recommendations to follow

## 2020-11-11 NOTE — H&P (Signed)
History and Physical    Glenda Terry [id number] DOB: 1940/05/13 DOA: 11/11/2020  PCP: Celene Squibb, MD   Patient coming from: Home  I have personally briefly reviewed patient's old medical records in [place]  Chief Complaint: Abdominal pain and anorexia  HPI: Glenda Terry is a 80 y.o. female with medical history significant of anxiety, COPD/asthma, chronic kidney disease a stage IIIb, essential hypertension, gastroesophageal flux disease, class II obesity and inflammatory arthritis; who presents to the hospital secondary to abdominal pain (7-8/10 in intensity, localized right lower quadrant and partially radiated to her mid lower aspect around umbilical area; associated with anorexia and essentially without any relieving factors), patient was seen at her gastroenterologist office and given presentation there, concerns for appendicitis or potential incarcerated hernia was suspected.  Of note, patient has been seen in 2 different locations in the emergency department with reassuring blood work and CT images for colitis or appendicitis.  General surgery was consulted and will see patient in consultation. Patient denies fever, hematemesis, melena, hematochezia, chest pain, shortness of breath, focal weakness, dysuria, hematuria or any other complaints.  Patient is vaccinated against COVID but no booster; COVID PCR at time of admission negative.   Review of Systems: As per HPI otherwise all other systems reviewed and are negative.   Past Medical History:  Diagnosis Date   Anxiety    Asthma    Back pain, chronic    CKD (chronic kidney disease) stage 3, GFR 30-59 ml/min (HCC)    Compression fx, thoracic spine (HCC)    T - 11   COPD (chronic obstructive pulmonary disease) ([place]) 12/16/2015   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HCC)    Essential hypertension    GERD (gastroesophageal reflux disease)    HOH (hard of hearing)    Inflammatory arthritis    Iron deficiency anemia 01/2012    Pulmonary eosinophilia (HCC)    Spinal stenosis    Tubular adenoma 11/2012    Past Surgical History:  Procedure Laterality Date   ABDOMINAL HYSTERECTOMY     BACK SURGERY     CARPAL TUNNEL RELEASE Right 06/2012   CATARACT EXTRACTION W/PHACO Left 11/19/2014   Procedure: CATARACT EXTRACTION PHACO AND INTRAOCULAR LENS PLACEMENT ([place]);  Surgeon: Williams Che, MD;  Location: [facility];  Service: Ophthalmology;  Laterality: Left;  CDE: 4.77   CATARACT EXTRACTION W/PHACO Right 02/03/2015   Procedure: CATARACT EXTRACTION PHACO AND INTRAOCULAR LENS PLACEMENT (I[facility]);  Surgeon: Williams Che, MD;  Location: [facility];  Service: Ophthalmology;  Laterality: Right;  CDE: 4.54   COLONOSCOPY  06/19/2008   RMR: tortuous and elongated colon with scattered left-sided diverticula/colonic mucosa appeared entirely normal. Prior colonic ulcers had resolved.   COLONOSCOPY  12/2007   Dr. Gala Romney: Scattered diffuse sigmoid diverticula, 2 areas of ulceration at the hepatic flexure. Biopsies unremarkable.   COLONOSCOPY N/A 11/20/2012   next TCS 11/2017   COLONOSCOPY WITH PROPOFOL N/A 12/22/2017   Procedure: COLONOSCOPY WITH PROPOFOL;  Surgeon: Daneil Dolin, MD; diverticula in the sigmoid and descending colon and 2 tubular adenomas.  No recommendations to repeat.     DECOMPRESSIVE LUMBAR LAMINECTOMY LEVEL 2  04/20/2012   Procedure: DECOMPRESSIVE LUMBAR LAMINECTOMY LEVEL 2;  Surgeon: Tobi Bastos, MD;  Location: [facility];  Service: Orthopedics;  Laterality: Right;  Decompressive Lumbar Laminectomy of the L4 - L5 and L5 - S1 Complete/Laminectomy L5 on the Right (X-Ray)   ESOPHAGOGASTRODUODENOSCOPY  12/2007   Dr. Gala Romney: Possible cervical esophageal whip, noncritical Schatzki  ring status post dilation. Small hiatal hernia. Slightly pale duodenal mucosa (biopsy unremarkable)   ESOPHAGOGASTRODUODENOSCOPY (EGD) WITH PROPOFOL N/A 01/20/2017   Dr. Gala Romney: Medium sized hiatal hernia empiric esophageal dilation for history of  dysphagia   ESOPHAGOGASTRODUODENOSCOPY (EGD) WITH PROPOFOL N/A 04/08/2020   Surgeon: Eloise Harman, DO;  medium size hiatal hernia 3 to 4 cm chicken bone lodged in the gastric antrum just proximal to the pylorus removed with a snare with mild bleeding at the site of lodged bone.  Normal examined duodenum.   EYE SURGERY     BIL CATARACTS   FOREIGN BODY REMOVAL N/A 04/08/2020   Procedure: FOREIGN BODY REMOVAL;  Surgeon: Eloise Harman, DO;  [facility];  Service: Endoscopy;  Laterality: N/A;   IR KYPHO THORACIC WITH BONE BIOPSY  02/08/2017   IR RADIOLOGIST EVAL & MGMT  02/02/2017   JOINT REPLACEMENT     KNEE ARTHROSCOPY Right    MALONEY DILATION N/A 01/20/2017   Procedure: MALONEY DILATION;  Surgeon: Daneil Dolin, MD;  [facility];  Service: Endoscopy;  Laterality: N/A;   POLYPECTOMY  12/22/2017   Procedure: POLYPECTOMY;  Surgeon: Daneil Dolin, MD;  [facility];  Service: Endoscopy;;   REVERSE SHOULDER ARTHROPLASTY Right 05/24/2014   Procedure: RIGHT SHOULDER REVERSE ARTHROPLASTY;  Surgeon: Netta Cedars, MD;  Location: [place];  Service: Orthopedics;  Laterality: Right;   REVERSE SHOULDER ARTHROPLASTY Left 06/10/2017   Procedure: LEFT REVERSE SHOULDER ARTHROPLASTY;  Surgeon: Netta Cedars, MD;  Location: [place];  Service: Orthopedics;  Laterality: Left;   TOTAL KNEE ARTHROPLASTY  01/24/2012   Procedure: TOTAL KNEE ARTHROPLASTY;  Surgeon: Gearlean Alf, MD;  [facility];  Service: Orthopedics;  Laterality: Right;   TOTAL KNEE ARTHROPLASTY Left 10/15/2019   Procedure: TOTAL KNEE ARTHROPLASTY;  Surgeon: Gaynelle Arabian, MD;  Location: [facility];  Service: Orthopedics;  Laterality: Left;  32mn   TUBAL LIGATION      Social History  reports that she quit smoking about 28 years ago. Her smoking use included cigarettes. She has a 80.00 pack-year smoking history. She has never used smokeless tobacco. She reports that she does not drink alcohol and  does not use drugs.  Allergies  Allergen Reactions   Flexeril [Cyclobenzaprine] Anaphylaxis   Other Anaphylaxis and Other (See Comments)    ALL TREE NUTS   Keflex [Cephalexin] Itching   Aspirin Other (See Comments)    Cannot take due to ulcers   Fentanyl Other (See Comments)    Lip Swelling   Adhesive [Tape] Rash   Chlorhexidine Rash    Family History  Problem Relation Age of Onset   Breast cancer Sister    Colon cancer Sister        two deceased, one living and terminal, one current undergoing treatment, ages [phone]  Hypertension Mother    Stroke Mother    Heart attack Mother    Hypertension Father    Prostate cancer Father     Prior to Admission medications   Medication Sig Start Date End Date Taking? Authorizing Provider  Acetaminophen (APAP 500 PO) Take 2 tablets by mouth 2 (two) times daily as needed.    [provider]  albuterol (PROAIR HFA) 108 (90 Base) MCG/ACT inhaler Inhale 2 puffs into the lungs every 6 (six) hours as needed for wheezing or shortness of breath (For COPD).     [provider]  ALPRAZolam (Duanne Moron 0.5 MG tablet Take 0.5  tablets (0.25 mg total) by mouth 2 (two) times daily as needed for anxiety. 10/25/19   Gerlene Fee, NP  amLODipine (NORVASC) 5 MG tablet Take 1 tablet (5 mg total) by mouth daily. 06/17/20   Elouise Munroe, MD  Benralizumab 30 MG/ML SOSY Inject 30 mg into the skin. Once A Day Every 56 Days 10/17/19   [provider]  bisacodyl (DULCOLAX) 5 MG EC tablet Take 5 mg by mouth daily as needed for moderate constipation.    [provider]  calcium carbonate (OS-CAL) 600 MG TABS tablet Take 600 mg by mouth 2 (two) times daily with a meal.    [provider]  Cholecalciferol (VITAMIN D3) 50 MCG (2000 UT) CAPS Take 1 capsule by mouth daily.    [provider]  denosumab (PROLIA) 60 MG/ML SOSY injection Inject 60 mg into the skin every 6 (six) months. 03/01/18   [provider]  diphenhydrAMINE (BENADRYL) 25 mg capsule Take 25 mg by mouth at bedtime.    [provider]  DULERA 200-5 MCG/ACT AERO Inhale 2 puffs into the lungs 2 (two) times daily. 10/25/19   Gerlene Fee, NP  esomeprazole (NEXIUM) 40 MG capsule Take 1 capsule (40 mg total) by mouth 2 (two) times daily. 10/25/19   Gerlene Fee, NP  ferrous sulfate 325 (65 FE) MG tablet Take 1 tablet (325 mg total) by mouth daily with breakfast. 10/25/19   Gerlene Fee, NP  fexofenadine (ALLEGRA) 180 MG tablet Take 180 mg by mouth daily.    [provider]  fluticasone (FLONASE) 50 MCG/ACT nasal spray Place 1 spray into both nostrils daily as needed for allergies or rhinitis.  10/17/19   [provider]  ipratropium-albuterol (DUONEB) 0.5-2.5 (3) MG/3ML SOLN Take 3 mLs by nebulization every 4 (four) hours as needed.    [provider]  ketotifen (ZADITOR) 0.025 % ophthalmic solution Place 1 drop into both eyes 2 (two) times daily as needed (allergies). 10/25/19   Gerlene Fee, NP  Magnesium 250 MG TABS Take 250 mg by mouth daily.    [provider]  Menthol, Topical Analgesic, (BIOFREEZE) 4 % GEL Apply 1 application topically 2 (two) times daily. Special Instructions: for back pain 10/23/19   [provider]  montelukast (SINGULAIR) 10 MG tablet Take 1 tablet (10 mg total) by mouth at bedtime. 07/25/20   Elouise Munroe, MD  nystatin (MYCOSTATIN) 100000 UNIT/ML suspension Take 5 mLs (500,000 Units total) by mouth 4 (four) times daily. 11/07/20   Tegeler, Gwenyth Allegra, MD  ondansetron (ZOFRAN ODT) 4 MG disintegrating tablet Take 1 tablet (4 mg total) by mouth every 8 (eight) hours as needed for nausea or vomiting. 11/09/20   Arnaldo Natal, MD  Oxycodone HCl 10 MG TABS Take 10 mg by mouth every 6 (six) hours. 12/29/19   [provider]  Polyethyl Glycol-Propyl Glycol (SYSTANE OP) Place 1 drop into both eyes 3 (three) times daily as needed  (dry/irritated eyes.).     [provider]  polyethylene glycol (MIRALAX / GLYCOLAX) 17 g packet Take 17 g by mouth daily. 11/07/20   Tegeler, Gwenyth Allegra, MD  potassium chloride (KLOR-CON) 10 MEQ tablet Take 1 tablet (10 mEq total) by mouth every other day. IN THE MORNING 10/25/19   Gerlene Fee, NP  pravastatin (PRAVACHOL) 10 MG tablet Take 10 mg by mouth daily. 03/27/20   [provider]  torsemide (DEMADEX) 10 MG tablet Take 20 mg  by mouth daily.    [provider]  vitamin C (ASCORBIC ACID) 500 MG tablet Take 500 mg by mouth daily.    [provider]    Physical Exam: Vitals:   11/11/20 1315  BP: (!) 142/61  Pulse: 85  Resp: 19  Temp: 98.2 F (36.8 C)  TempSrc: Oral  SpO2: 95%    Constitutional: In moderate distress secondary to ongoing abdominal pain.  No nausea, no vomiting, no chest pain or shortness of breath. Vitals:   11/11/20 1315  BP: (!) 142/61  Pulse: 85  Resp: 19  Temp: 98.2 F (36.8 C)  TempSrc: Oral  SpO2: 95%   Eyes: PERRL, lids and conjunctivae normal, no icterus, no nystagmus. ENMT: Mucous membranes are moist. Posterior pharynx clear of any exudate or lesions.Normal dentition.  Neck: normal, supple, no masses, no thyromegaly; no JVD. Respiratory: clear to auscultation bilaterally, no wheezing, no crackles. Normal respiratory effort. No accessory muscle use.  Good saturation on room air currently. Cardiovascular: Regular rate and rhythm, no murmurs / rubs / gallops.  No carotid bruits. Abdomen: Tenderness to palpation around the umbilical area and right lower quadrant.  Positive bowel sounds; soft and no guarding.  Positive umbilical hernia appreciated. Musculoskeletal: no cyanosis or clubbing; normal muscle tone.  Good range of motion appreciated on exam. Skin: no rashes, no petechiae. Neurologic: CN 2-12 grossly intact. Sensation intact, DTR normal. Strength 5/5 in all 4.  Psychiatric: Normal judgment and insight.  Alert and oriented x 3. Normal mood.   Labs on Admission: I have personally reviewed following labs and imaging studies  CBC: Recent Labs  Lab 11/06/20 1914 11/07/20 1654 11/09/20 1740  WBC 11.8* 10.3 9.2  HGB 12.7 12.7 12.2  HCT 40.7 40.8 38.8  MCV 80.4 79.4* 78.7*  PLT 313 320 [id number]    Basic Metabolic Panel: Recent Labs  Lab 11/06/20 1914 11/07/20 1654 11/09/20 1740  NA 133* 136 134*  K 3.2* 3.5 3.3*  CL 96* 92* 92*  CO2 29 33* 31  GLUCOSE 111* 105* 108*  BUN '16 23 16  '$ CREATININE 1.04* 1.41* 1.28*  CALCIUM 8.8* 9.3 8.9    GFR: Estimated Creatinine Clearance: 34.5 mL/min (A) (by C-G formula based on SCr of 1.28 mg/dL (H)).  Liver Function Tests: Recent Labs  Lab 11/06/20 1914 11/07/20 1654 11/09/20 1740  AST '24 20 20  '$ ALT '22 20 17  '$ ALKPHOS 56 51 46  BILITOT 0.6 0.6 0.5  PROT 7.4 7.7 7.2  ALBUMIN 3.8 4.3 4.2    Urine analysis:    Component Value Date/Time   COLORURINE YELLOW 11/09/2020 1740   APPEARANCEUR CLEAR 11/09/2020 1740   LABSPEC 1.013 11/09/2020 1740   PHURINE 6.5 11/09/2020 1740   GLUCOSEU NEGATIVE 11/09/2020 1740   HGBUR SMALL (A) 11/09/2020 1740   BILIRUBINUR NEGATIVE 11/09/2020 1740   KETONESUR NEGATIVE 11/09/2020 1740   PROTEINUR NEGATIVE 11/09/2020 1740   UROBILINOGEN 0.2 03/03/2014 1324   NITRITE NEGATIVE 11/09/2020 1740   LEUKOCYTESUR TRACE (A) 11/09/2020 1740    Radiological Exams on Admission: CT Renal Stone Study  Result Date: 11/09/2020 CLINICAL DATA:  Concern for kidney stone. EXAM: CT ABDOMEN AND PELVIS WITHOUT CONTRAST TECHNIQUE: Multidetector CT imaging of the abdomen and pelvis was performed following the standard protocol without IV contrast. COMPARISON:  CT abdomen pelvis dated 11/07/2020. FINDINGS: Evaluation of this exam is limited in the absence of intravenous contrast. Lower chest: The visualized lung bases are clear. No intra-abdominal free air or free fluid. Hepatobiliary: Faint  ill-defined 15 mm hypodense lesion in  the posterior right lobe of the liver (10/2) is not characterized. The liver is otherwise unremarkable. No intrahepatic biliary ductal dilatation. The gallbladder is unremarkable. Pancreas: Unremarkable. No pancreatic ductal dilatation or surrounding inflammatory changes. Spleen: Normal in size without focal abnormality. Adrenals/Urinary Tract: The adrenal glands are unremarkable. The kidneys, visualized ureters, and urinary bladder appear unremarkable. Stomach/Bowel: There is moderate size hiatal hernia. There is no bowel obstruction or active inflammation. There is sigmoid diverticulosis without active inflammatory changes. The appendix is normal. Vascular/Lymphatic: Moderate aortoiliac atherosclerotic disease. The IVC is unremarkable. No portal venous gas. There is no adenopathy Reproductive: Hysterectomy.  No adnexal masses. Other: None Musculoskeletal: Osteopenia with scoliosis and degenerative changes of the spine. T11 vertebroplasty. No acute osseous pathology. IMPRESSION: 1. No acute intra-abdominal or pelvic pathology. No hydronephrosis or nephrolithiasis. 2. Sigmoid diverticulosis. No bowel obstruction. Normal appendix. 3. Moderate size hiatal hernia. 4. Aortic Atherosclerosis (ICD10-I70.0). Electronically Signed   By: Anner Crete M.D.   On: 11/09/2020 19:28    Assessment/Plan 1-abdominal pain: Right lower quadrant -With concern for chronic appendicitis versus Spiegel hernia -IV fluid resuscitation and analgesics will be provided -Continue supportive care and follow general surgery recommendations. -Clear liquids has been initially started in order to pursued some bowel rest. -As needed antiemetics will be also ordered; currently asymptomatic.  2-COPD/asthma -No acute exacerbation appreciated -Continue Singulair, Flonase and Dulera -As needed DuoNeb in place  3-gastroesophageal flux disease -Continue PPI.  4-essential hypertension -Is stable and well-controlled -Continue home  antihypertensive agents.  5-hyperlipidemia -Continue statins.  6-history of anxiety -Continue as needed alprazolam. -Mood is a stable currently.  7-chronic inflammatory arthritis -Continue the use of rheumatologic agents at time of discharge. -Continue as needed analgesics at this time.  8-class II obesity -BMI 35.58 -Low calorie diet, portion control and increase physical activity discussed with patient.   DVT prophylaxis: Heparin Code Status:   Full code Family Communication:  No family at bedside Disposition Plan:   Patient is from:  Home  Anticipated DC to:  Home  Anticipated DC date:  9/1--9/2  Anticipated DC barriers: Resolution/improvement in abdominal pain  Consults called:  General surgery (Dr. Arnoldo Morale). Admission status: Inpatient, MedSurg, length of stay more than 2 midnights.  Severity of Illness: The appropriate patient status for this patient is INPATIENT. Inpatient status is judged to be reasonable and necessary in order to provide the required intensity of service to ensure the patient's safety. The patient's presenting symptoms, physical exam findings, and initial radiographic and laboratory data in the context of their chronic comorbidities is felt to place them at high risk for further clinical deterioration. Furthermore, it is not anticipated that the patient will be medically stable for discharge from the hospital within 2 midnights of admission. The following factors support the patient status of inpatient.   " The patient's presenting symptoms include abdominal pain and anorexia. " The worrisome physical exam findings include excruciating right lower quadrant/umbilical pain on examination.. " The initial radiographic and laboratory data are worrisome because of stable blood work and no significant abnormality on patient's images; further evaluation pending by consultants.. " The chronic co-morbidities include COPD/asthma, class II obesity, hypertension,  hyperlipidemia, chronic inflammatory arthritis.   * I certify that at the point of admission it is my clinical judgment that the patient will require inpatient hospital care spanning beyond 2 midnights from the point of admission due to high intensity of service, high risk for further deterioration and high frequency  of surveillance required.Barton Dubois MD [facility]  How to contact the [facility] Attending or Consulting provider [place] or covering provider during after hours [place], for this patient?   Check the care team in [facility] and look for a) attending/consulting [facility] provider listed and b) the [facility] team listed Log into [email or web address] and use [place]'s universal password to access. If you do not have the password, please contact the hospital operator. Locate the [facility] provider you are looking for under [facility] and page to a number that you can be directly reached. If you still have difficulty reaching the provider, please page the [facility] (Director on Call) for the Hospitalists listed on amion for assistance.  11/11/2020, 1:47 PM

## 2020-11-11 NOTE — H&P (View-Only) (Signed)
Reason for Consult: Lower abdominal pain Referring Physician: Dr. Sadie Haber is an 80 y.o. female.  HPI: Patient is a 80 year old black female with multiple medical problems who has been in the emergency room twice for evaluation treatment of right lower quadrant abdominal pain.  She has had multiple CTs in the emergency room and was only found to have an umbilical hernia.  She was being seen by Dr. Gala Romney earlier today in his office and she was having excruciating lower abdominal pain which was crampy in nature.  The patient was admitted to the hospital for further evaluation and treatment.  She states that her pain has been intermittent for some time now.  Nothing seems to instigate the pain.  She has had multiple surgeries through her umbilicus in the past.  She has not noticed a mass in the lower abdomen.  She denies any nausea or vomiting.  She states she is currently hungry.  She has been constipated, requiring cathartics.  She denies any fever or chills.  Past Medical History:  Diagnosis Date   Anxiety    Asthma    Back pain, chronic    CKD (chronic kidney disease) stage 3, GFR 30-59 ml/min (HCC)    Compression fx, thoracic spine (HCC)    T - 11   COPD (chronic obstructive pulmonary disease) ([place]) 12/16/2015   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HCC)    Essential hypertension    GERD (gastroesophageal reflux disease)    HOH (hard of hearing)    Inflammatory arthritis    Iron deficiency anemia 01/2012   Pulmonary eosinophilia (HCC)    Spinal stenosis    Tubular adenoma 11/2012    Past Surgical History:  Procedure Laterality Date   ABDOMINAL HYSTERECTOMY     BACK SURGERY     CARPAL TUNNEL RELEASE Right 06/2012   CATARACT EXTRACTION W/PHACO Left 11/19/2014   Procedure: CATARACT EXTRACTION PHACO AND INTRAOCULAR LENS PLACEMENT ([place]);  Surgeon: Williams Che, MD;  Location: [facility];  Service: Ophthalmology;  Laterality: Left;  CDE: 4.77   CATARACT EXTRACTION W/PHACO Right 02/03/2015    Procedure: CATARACT EXTRACTION PHACO AND INTRAOCULAR LENS PLACEMENT ([facility]);  Surgeon: Williams Che, MD;  Location: [facility];  Service: Ophthalmology;  Laterality: Right;  CDE: 4.54   COLONOSCOPY  06/19/2008   RMR: tortuous and elongated colon with scattered left-sided diverticula/colonic mucosa appeared entirely normal. Prior colonic ulcers had resolved.   COLONOSCOPY  12/2007   Dr. Gala Romney: Scattered diffuse sigmoid diverticula, 2 areas of ulceration at the hepatic flexure. Biopsies unremarkable.   COLONOSCOPY N/A 11/20/2012   next TCS 11/2017   COLONOSCOPY WITH PROPOFOL N/A 12/22/2017   Procedure: COLONOSCOPY WITH PROPOFOL;  Surgeon: Daneil Dolin, MD; diverticula in the sigmoid and descending colon and 2 tubular adenomas.  No recommendations to repeat.     DECOMPRESSIVE LUMBAR LAMINECTOMY LEVEL 2  04/20/2012   Procedure: DECOMPRESSIVE LUMBAR LAMINECTOMY LEVEL 2;  Surgeon: Tobi Bastos, MD;  Location: [facility];  Service: Orthopedics;  Laterality: Right;  Decompressive Lumbar Laminectomy of the L4 - L5 and L5 - S1 Complete/Laminectomy L5 on the Right (X-Ray)   ESOPHAGOGASTRODUODENOSCOPY  12/2007   Dr. 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 Small hiatal hernia. Slightly pale duodenal mucosa (biopsy unremarkable)   ESOPHAGOGASTRODUODENOSCOPY (EGD) WITH PROPOFOL N/A 01/20/2017   Dr. Gala Romney: Medium sized hiatal hernia empiric esophageal dilation for history of dysphagia   ESOPHAGOGASTRODUODENOSCOPY (EGD) WITH PROPOFOL N/A 04/08/2020   Surgeon: Hurshel Keys  K, DO;  medium size hiatal hernia 3 to 4 cm chicken bone lodged in the gastric antrum just proximal to the pylorus removed with a snare with mild bleeding at the site of lodged bone.  Normal examined duodenum.   EYE SURGERY     BIL CATARACTS   FOREIGN BODY REMOVAL N/A 04/08/2020   Procedure: FOREIGN BODY REMOVAL;  Surgeon: Eloise Harman, DO;  [facility];  Service:  Endoscopy;  Laterality: N/A;   IR KYPHO THORACIC WITH BONE BIOPSY  02/08/2017   IR RADIOLOGIST EVAL & MGMT  02/02/2017   JOINT REPLACEMENT     KNEE ARTHROSCOPY Right    MALONEY DILATION N/A 01/20/2017   Procedure: MALONEY DILATION;  Surgeon: Daneil Dolin, MD;  [facility];  Service: Endoscopy;  Laterality: N/A;   POLYPECTOMY  12/22/2017   Procedure: POLYPECTOMY;  Surgeon: Daneil Dolin, MD;  [facility];  Service: Endoscopy;;   REVERSE SHOULDER ARTHROPLASTY Right 05/24/2014   Procedure: RIGHT SHOULDER REVERSE ARTHROPLASTY;  Surgeon: Netta Cedars, MD;  Location: [place];  Service: Orthopedics;  Laterality: Right;   REVERSE SHOULDER ARTHROPLASTY Left 06/10/2017   Procedure: LEFT REVERSE SHOULDER ARTHROPLASTY;  Surgeon: Netta Cedars, MD;  Location: [place];  Service: Orthopedics;  Laterality: Left;   TOTAL KNEE ARTHROPLASTY  01/24/2012   Procedure: TOTAL KNEE ARTHROPLASTY;  Surgeon: Gearlean Alf, MD;  [facility]: [facility];  Service: Orthopedics;  Laterality: Right;   TOTAL KNEE ARTHROPLASTY Left 10/15/2019   Procedure: TOTAL KNEE ARTHROPLASTY;  Surgeon: Gaynelle Arabian, MD;  Location: [facility];  Service: Orthopedics;  Laterality: Left;  53mn   TUBAL LIGATION      Family History  Problem Relation Age of Onset   Breast cancer Sister    Colon cancer Sister        two deceased, one living and terminal, one current undergoing treatment, ages [phone]  Hypertension Mother    Stroke Mother    Heart attack Mother    Hypertension Father    Prostate cancer Father     Social History:  reports that she quit smoking about 28 years ago. Her smoking use included cigarettes. She has a 80.00 pack-year smoking history. She has never used smokeless tobacco. She reports that she does not drink alcohol and does not use drugs.  Allergies:  Allergies  Allergen Reactions   Flexeril [Cyclobenzaprine] Anaphylaxis   Other Anaphylaxis and Other (See Comments)    ALL TREE NUTS    Keflex [Cephalexin] Itching   Aspirin Other (See Comments)    Cannot take due to ulcers   Fentanyl Other (See Comments)    Lip Swelling   Adhesive [Tape] Rash   Chlorhexidine Rash    Medications: Prior to Admission:  Medications Prior to Admission  Medication Sig Dispense Refill Last Dose   Acetaminophen (APAP 500 PO) Take 2 tablets by mouth 2 (two) times daily as needed.      albuterol (PROAIR HFA) 108 (90 Base) MCG/ACT inhaler Inhale 2 puffs into the lungs every 6 (six) hours as needed for wheezing or shortness of breath (For COPD).       ALPRAZolam (XANAX) 0.5 MG tablet Take 0.5 tablets (0.25 mg total) by mouth 2 (two) times daily as needed for anxiety. 10 tablet 0    amLODipine (NORVASC) 5 MG tablet Take 1 tablet (5 mg total) by mouth daily. 30 tablet 3    Benralizumab 30 MG/ML SOSY Inject 30 mg into the  skin. Once A Day Every 56 Days      bisacodyl (DULCOLAX) 5 MG EC tablet Take 5 mg by mouth daily as needed for moderate constipation.      calcium carbonate (OS-CAL) 600 MG TABS tablet Take 600 mg by mouth 2 (two) times daily with a meal.      Cholecalciferol (VITAMIN D3) 50 MCG (2000 UT) CAPS Take 1 capsule by mouth daily.      denosumab (PROLIA) 60 MG/ML SOSY injection Inject 60 mg into the skin every 6 (six) months.      diphenhydrAMINE (BENADRYL) 25 mg capsule Take 25 mg by mouth at bedtime.      DULERA 200-5 MCG/ACT AERO Inhale 2 puffs into the lungs 2 (two) times daily. 8.8 g 0    esomeprazole (NEXIUM) 40 MG capsule Take 1 capsule (40 mg total) by mouth 2 (two) times daily. 60 capsule 0    ferrous sulfate 325 (65 FE) MG tablet Take 1 tablet (325 mg total) by mouth daily with breakfast. 30 tablet 0    fexofenadine (ALLEGRA) 180 MG tablet Take 180 mg by mouth daily.      fluticasone (FLONASE) 50 MCG/ACT nasal spray Place 1 spray into both nostrils daily as needed for allergies or rhinitis.       ipratropium-albuterol (DUONEB) 0.5-2.5 (3) MG/3ML SOLN Take 3 mLs by nebulization every  4 (four) hours as needed.      ketotifen (ZADITOR) 0.025 % ophthalmic solution Place 1 drop into both eyes 2 (two) times daily as needed (allergies). 5 mL 0    Magnesium 250 MG TABS Take 250 mg by mouth daily.      Menthol, Topical Analgesic, (BIOFREEZE) 4 % GEL Apply 1 application topically 2 (two) times daily. Special Instructions: for back pain      montelukast (SINGULAIR) 10 MG tablet Take 1 tablet (10 mg total) by mouth at bedtime. 90 tablet 2    nystatin (MYCOSTATIN) 100000 UNIT/ML suspension Take 5 mLs (500,000 Units total) by mouth 4 (four) times daily. 60 mL 0    ondansetron (ZOFRAN ODT) 4 MG disintegrating tablet Take 1 tablet (4 mg total) by mouth every 8 (eight) hours as needed for nausea or vomiting. 20 tablet 0    Oxycodone HCl 10 MG TABS Take 10 mg by mouth every 6 (six) hours.      Polyethyl Glycol-Propyl Glycol (SYSTANE OP) Place 1 drop into both eyes 3 (three) times daily as needed (dry/irritated eyes.).       polyethylene glycol (MIRALAX / GLYCOLAX) 17 g packet Take 17 g by mouth daily. 14 each 0    potassium chloride (KLOR-CON) 10 MEQ tablet Take 1 tablet (10 mEq total) by mouth every other day. IN THE MORNING 15 tablet 0    pravastatin (PRAVACHOL) 10 MG tablet Take 10 mg by mouth daily.      torsemide (DEMADEX) 10 MG tablet Take 20 mg by mouth daily.      vitamin C (ASCORBIC ACID) 500 MG tablet Take 500 mg by mouth daily.       Results for orders placed or performed during the hospital encounter of 11/11/20 (from the past 48 hour(s))  CBC     Status: Abnormal   Collection Time: 11/11/20  2:27 PM  Result Value Ref Range   WBC 9.8 4.0 - 10.5 K/uL   RBC 5.11 3.87 - 5.11 MIL/uL   Hemoglobin 13.0 12.0 - 15.0 g/dL   HCT 41.5 36.0 - 46.0 %   MCV  81.2 80.0 - 100.0 fL   MCH 25.4 (L) 26.0 - 34.0 pg   MCHC 31.3 30.0 - 36.0 g/dL   RDW 15.4 11.5 - 15.5 %   Platelets 299 150 - 400 K/uL   nRBC 0.0 0.0 - 0.2 %    Comment: Performed at [facility], [address] [place],  [place] [address]  Creatinine, serum     Status: Abnormal   Collection Time: 11/11/20  2:27 PM  Result Value Ref Range   Creatinine, Ser 1.65 (H) 0.44 - 1.00 mg/dL   GFR, Estimated 32 (L) >60 mL/min    Comment: (NOTE) Calculated using the CKD-EPI Creatinine Equation (2021) Performed at [facility], [address] [place], [place] [address]   Resp Panel by RT-PCR (Flu A&B, Covid) Nasopharyngeal Swab     Status: None   Collection Time: 11/11/20  4:30 PM   Specimen: Nasopharyngeal Swab; Nasopharyngeal(NP) swabs in vial transport medium  Result Value Ref Range   SARS Coronavirus 2 by RT PCR NEGATIVE NEGATIVE    Comment: (NOTE) SARS-CoV-2 target nucleic acids are NOT DETECTED.  The SARS-CoV-2 RNA is generally detectable in upper respiratory specimens during the acute phase of infection. The lowest concentration of SARS-CoV-2 viral copies this assay can detect is 138 copies/mL. A negative result does not preclude SARS-Cov-2 infection and should not be used as the sole basis for treatment or other patient management decisions. A negative result may occur with  improper specimen collection/handling, submission of specimen other than nasopharyngeal swab, presence of viral mutation(s) within the areas targeted by this assay, and inadequate number of viral copies(<138 copies/mL). A negative result must be combined with clinical observations, patient history, and epidemiological information. The expected result is Negative.  Fact Sheet for Patients:  EntrepreneurPulse.com.au  Fact Sheet for Healthcare Providers:  IncredibleEmployment.be  This test is no t yet approved or cleared by the [place] FDA and  has been authorized for detection and/or diagnosis of SARS-CoV-2 by FDA under an Emergency Use Authorization (EUA). This EUA will remain  in effect (meaning this test can be used) for the duration of the COVID-19 declaration under Section 564(b)(1) of the  Act, 21 U.S.C.section 360bbb-3(b)(1), unless the authorization is terminated  or revoked sooner.       Influenza A by PCR NEGATIVE NEGATIVE   Influenza B by PCR NEGATIVE NEGATIVE    Comment: (NOTE) The Xpert Xpress SARS-CoV-2/FLU/RSV plus assay is intended as an aid in the diagnosis of influenza from Nasopharyngeal swab specimens and should not be used as a sole basis for treatment. Nasal washings and aspirates are unacceptable for Xpert Xpress SARS-CoV-2/FLU/RSV testing.  Fact Sheet for Patients: EntrepreneurPulse.com.au  Fact Sheet for Healthcare Providers: IncredibleEmployment.be  This test is not yet approved or cleared by the [place] FDA and has been authorized for detection and/or diagnosis of SARS-CoV-2 by FDA under an Emergency Use Authorization (EUA). This EUA will remain in effect (meaning this test can be used) for the duration of the COVID-19 declaration under Section 564(b)(1) of the Act, 21 U.S.C. section 360bbb-3(b)(1), unless the authorization is terminated or revoked.  Performed at [facility], [address], [place], [place] [address]     CT Renal Stone Study  Result Date: 11/09/2020 CLINICAL DATA:  Concern for kidney stone. EXAM: CT ABDOMEN AND PELVIS WITHOUT CONTRAST TECHNIQUE: Multidetector CT imaging of the abdomen and pelvis was performed following the standard protocol without IV contrast. COMPARISON:  CT abdomen pelvis dated 11/07/2020. FINDINGS: Evaluation of this  exam is limited in the absence of intravenous contrast. Lower chest: The visualized lung bases are clear. No intra-abdominal free air or free fluid. Hepatobiliary: Faint ill-defined 15 mm hypodense lesion in the posterior right lobe of the liver (10/2) is not characterized. The liver is otherwise unremarkable. No intrahepatic biliary ductal dilatation. The gallbladder is unremarkable. Pancreas: Unremarkable. No pancreatic ductal dilatation or surrounding  inflammatory changes. Spleen: Normal in size without focal abnormality. Adrenals/Urinary Tract: The adrenal glands are unremarkable. The kidneys, visualized ureters, and urinary bladder appear unremarkable. Stomach/Bowel: There is moderate size hiatal hernia. There is no bowel obstruction or active inflammation. There is sigmoid diverticulosis without active inflammatory changes. The appendix is normal. Vascular/Lymphatic: Moderate aortoiliac atherosclerotic disease. The IVC is unremarkable. No portal venous gas. There is no adenopathy Reproductive: Hysterectomy.  No adnexal masses. Other: None Musculoskeletal: Osteopenia with scoliosis and degenerative changes of the spine. T11 vertebroplasty. No acute osseous pathology. IMPRESSION: 1. No acute intra-abdominal or pelvic pathology. No hydronephrosis or nephrolithiasis. 2. Sigmoid diverticulosis. No bowel obstruction. Normal appendix. 3. Moderate size hiatal hernia. 4. Aortic Atherosclerosis (ICD10-I70.0). Electronically Signed   By: Anner Crete M.D.   On: 11/09/2020 19:28    ROS:  Pertinent items are noted in HPI.  Blood pressure 125/70, pulse (!) 103, temperature 98.9 F (37.2 C), temperature source Oral, resp. rate 18, SpO2 100 %. Physical Exam: Anxious black female who has waves of lower abdominal pain causing her mild distress. Head is normocephalic, atraumatic Lung sounds are distant but without wheezing Heart examination reveals regular rate and rhythm without S3, S4, murmurs Abdomen is soft with migratory tenderness present.  When I pushed on her umbilical hernia, she stated that this made her pain worse.  It seemed to reproduce her pain.  This was also witnessed by her family members.  Past ER visits reviewed.  CT scan images personally reviewed  Assessment/Plan: Impression: Abdominal pain possibly secondary to an incisional hernia.  This did reproduce the pain.  In reviewing her CT scans, she does not have evidence of any  intra-abdominal pathology that would explain her pain otherwise.  She has had a surgical incision through her umbilicus in the past. Plan: We will proceed with an incisional herniorrhaphy with mesh tomorrow.  The risks and benefits of the procedure including bleeding, infection, mesh use, cardiopulmonary difficulties, and the possibility of recurrence of the pain were fully explained to the patient and family, who gave informed consent.  Aviva Signs 11/11/2020, 5:43 PM

## 2020-11-11 NOTE — Progress Notes (Signed)
Primary Care Physician:  Celene Squibb, MD Primary Gastroenterologist:  Dr.   Pre-Procedure History & Physical: HPI:  Glenda Terry is a 80 y.o. female here for to further evaluate 1 week history of right lower quadrant abdominal pain.  Patient states pain came on her rather suddenly about a week ago.  Has been unrelenting.  It is worse with movement.  Seen in the emergency department at [facility] x2 (8/26, 8/28). Contrast CT and noncontrast renal study demonstrated no evidence of appendicitis, cholecystitis or other inflammatory process did have a small knuckle of small bowel/fat and a small umbilical hernia.  No evidence of obstipation/constipation or other process to explain her symptoms.  Typically moves her bowels regularly with prune juice and MiraLAX.  She was told to take extra MiraLAX over the past week and had multiple bowel movements.  She not had any melena or rectal bleeding.  Has had anorexia recently.  No nausea or vomiting.  Lab work including LFTs urinalysis all good.  No fever or chills reported. She does have a history of constipation with an opioid induced component.  Previously on Movantik but this caused abdominal cramps and diarrhea.  She stopped this medication the first of the year.  History of GERD well-controlled on Nexium. Past Medical History:  Diagnosis Date   Anxiety    Asthma    Back pain, chronic    CKD (chronic kidney disease) stage 3, GFR 30-59 ml/min (HCC)    Compression fx, thoracic spine (HCC)    T - 11   COPD (chronic obstructive pulmonary disease) ([place]) 12/16/2015   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HCC)    Essential hypertension    GERD (gastroesophageal reflux disease)    HOH (hard of hearing)    Inflammatory arthritis    Iron deficiency anemia 01/2012   Pulmonary eosinophilia (HCC)    Spinal stenosis    Tubular adenoma 11/2012    Past Surgical History:  Procedure Laterality Date   ABDOMINAL HYSTERECTOMY     BACK SURGERY     CARPAL TUNNEL RELEASE  Right 06/2012   CATARACT EXTRACTION W/PHACO Left 11/19/2014   Procedure: CATARACT EXTRACTION PHACO AND INTRAOCULAR LENS PLACEMENT ([place]);  Surgeon: Williams Che, MD;  Location: [facility];  Service: Ophthalmology;  Laterality: Left;  CDE: 4.77   CATARACT EXTRACTION W/PHACO Right 02/03/2015   Procedure: CATARACT EXTRACTION PHACO AND INTRAOCULAR LENS PLACEMENT ([facility]);  Surgeon: Williams Che, MD;  Location: [facility];  Service: Ophthalmology;  Laterality: Right;  CDE: 4.54   COLONOSCOPY  06/19/2008   RMR: tortuous and elongated colon with scattered left-sided diverticula/colonic mucosa appeared entirely normal. Prior colonic ulcers had resolved.   COLONOSCOPY  12/2007   Dr. Gala Romney: Scattered diffuse sigmoid diverticula, 2 areas of ulceration at the hepatic flexure. Biopsies unremarkable.   COLONOSCOPY N/A 11/20/2012   next TCS 11/2017   COLONOSCOPY WITH PROPOFOL N/A 12/22/2017   Procedure: COLONOSCOPY WITH PROPOFOL;  Surgeon: Daneil Dolin, MD; diverticula in the sigmoid and descending colon and 2 tubular adenomas.  No recommendations to repeat.     DECOMPRESSIVE LUMBAR LAMINECTOMY LEVEL 2  04/20/2012   Procedure: DECOMPRESSIVE LUMBAR LAMINECTOMY LEVEL 2;  Surgeon: Tobi Bastos, MD;  Location: [facility];  Service: Orthopedics;  Laterality: Right;  Decompressive Lumbar Laminectomy of the L4 - L5 and L5 - S1 Complete/Laminectomy L5 on the Right (X-Ray)   ESOPHAGOGASTRODUODENOSCOPY  12/2007   Dr. 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 Small hiatal  hernia. Slightly pale duodenal mucosa (biopsy unremarkable)   ESOPHAGOGASTRODUODENOSCOPY (EGD) WITH PROPOFOL N/A 01/20/2017   Dr. Gala Romney: Medium sized hiatal hernia empiric esophageal dilation for history of dysphagia   ESOPHAGOGASTRODUODENOSCOPY (EGD) WITH PROPOFOL N/A 04/08/2020   Surgeon: Eloise Harman, DO;  medium size hiatal hernia 3 to 4 cm chicken bone lodged in the gastric antrum just proximal to  the pylorus removed with a snare with mild bleeding at the site of lodged bone.  Normal examined duodenum.   EYE SURGERY     BIL CATARACTS   FOREIGN BODY REMOVAL N/A 04/08/2020   Procedure: FOREIGN BODY REMOVAL;  Surgeon: Eloise Harman, DO;  [facility];  Service: Endoscopy;  Laterality: N/A;   IR KYPHO THORACIC WITH BONE BIOPSY  02/08/2017   IR RADIOLOGIST EVAL & MGMT  02/02/2017   JOINT REPLACEMENT     KNEE ARTHROSCOPY Right    MALONEY DILATION N/A 01/20/2017   Procedure: MALONEY DILATION;  Surgeon: Daneil Dolin, MD;  [facility];  Service: Endoscopy;  Laterality: N/A;   POLYPECTOMY  12/22/2017   Procedure: POLYPECTOMY;  Surgeon: Daneil Dolin, MD;  [facility];  Service: Endoscopy;;   REVERSE SHOULDER ARTHROPLASTY Right 05/24/2014   Procedure: RIGHT SHOULDER REVERSE ARTHROPLASTY;  Surgeon: Netta Cedars, MD;  Location: [place];  Service: Orthopedics;  Laterality: Right;   REVERSE SHOULDER ARTHROPLASTY Left 06/10/2017   Procedure: LEFT REVERSE SHOULDER ARTHROPLASTY;  Surgeon: Netta Cedars, MD;  Location: [place];  Service: Orthopedics;  Laterality: Left;   TOTAL KNEE ARTHROPLASTY  01/24/2012   Procedure: TOTAL KNEE ARTHROPLASTY;  Surgeon: Gearlean Alf, MD;  Location: [facility];  Service: Orthopedics;  Laterality: Right;   TOTAL KNEE ARTHROPLASTY Left 10/15/2019   Procedure: TOTAL KNEE ARTHROPLASTY;  Surgeon: Gaynelle Arabian, MD;  Location: [facility];  Service: Orthopedics;  Laterality: Left;  45mn   TUBAL LIGATION      Prior to Admission medications   Medication Sig Start Date End Date Taking? Authorizing Provider  Acetaminophen (APAP 500 PO) Take 2 tablets by mouth 2 (two) times daily as needed.   Yes [provider]  albuterol (PROAIR HFA) 108 (90 Base) MCG/ACT inhaler Inhale 2 puffs into the lungs every 6 (six) hours as needed for wheezing or shortness of breath (For COPD).    Yes [provider]  ALPRAZolam (Duanne Moron 0.5 MG  tablet Take 0.5 tablets (0.25 mg total) by mouth 2 (two) times daily as needed for anxiety. 10/25/19  Yes GGerlene Fee NP  amLODipine (NORVASC) 5 MG tablet Take 1 tablet (5 mg total) by mouth daily. 06/17/20  Yes AElouise Munroe MD  Benralizumab 30 MG/ML SOSY Inject 30 mg into the skin. Once A Day Every 56 Days 10/17/19  Yes [provider]  bisacodyl (DULCOLAX) 5 MG EC tablet Take 5 mg by mouth daily as needed for moderate constipation.   Yes [provider]  calcium carbonate (OS-CAL) 600 MG TABS tablet Take 600 mg by mouth 2 (two) times daily with a meal.   Yes [provider]  Cholecalciferol (VITAMIN D3) 50 MCG (2000 UT) CAPS Take 1 capsule by mouth daily.   Yes [provider]  denosumab (PROLIA) 60 MG/ML SOSY injection Inject 60 mg into the skin every 6 (six) months. 03/01/18  Yes [provider]  diphenhydrAMINE (BENADRYL) 25 mg capsule Take 25 mg by mouth at bedtime.   Yes [provider]  DULERA 200-5 MCG/ACT AERO Inhale 2  puffs into the lungs 2 (two) times daily. 10/25/19  Yes Gerlene Fee, NP  esomeprazole (NEXIUM) 40 MG capsule Take 1 capsule (40 mg total) by mouth 2 (two) times daily. 10/25/19  Yes Gerlene Fee, NP  ferrous sulfate 325 (65 FE) MG tablet Take 1 tablet (325 mg total) by mouth daily with breakfast. 10/25/19  Yes Gerlene Fee, NP  fexofenadine (ALLEGRA) 180 MG tablet Take 180 mg by mouth daily.   Yes [provider]  fluticasone (FLONASE) 50 MCG/ACT nasal spray Place 1 spray into both nostrils daily as needed for allergies or rhinitis.  10/17/19  Yes [provider]  ipratropium-albuterol (DUONEB) 0.5-2.5 (3) MG/3ML SOLN Take 3 mLs by nebulization every 4 (four) hours as needed.   Yes [provider]  ketotifen (ZADITOR) 0.025 % ophthalmic solution Place 1 drop into both eyes 2 (two) times daily as needed (allergies). 10/25/19  Yes Gerlene Fee, NP  Magnesium 250 MG TABS Take 250  mg by mouth daily.   Yes [provider]  Menthol, Topical Analgesic, (BIOFREEZE) 4 % GEL Apply 1 application topically 2 (two) times daily. Special Instructions: for back pain 10/23/19  Yes [provider]  montelukast (SINGULAIR) 10 MG tablet Take 1 tablet (10 mg total) by mouth at bedtime. 07/25/20  Yes Elouise Munroe, MD  nystatin (MYCOSTATIN) 100000 UNIT/ML suspension Take 5 mLs (500,000 Units total) by mouth 4 (four) times daily. 11/07/20  Yes Tegeler, Gwenyth Allegra, MD  ondansetron (ZOFRAN ODT) 4 MG disintegrating tablet Take 1 tablet (4 mg total) by mouth every 8 (eight) hours as needed for nausea or vomiting. 11/09/20  Yes Arnaldo Natal, MD  Oxycodone HCl 10 MG TABS Take 10 mg by mouth every 6 (six) hours. 12/29/19  Yes [provider]  Polyethyl Glycol-Propyl Glycol (SYSTANE OP) Place 1 drop into both eyes 3 (three) times daily as needed (dry/irritated eyes.).    Yes [provider]  polyethylene glycol (MIRALAX / GLYCOLAX) 17 g packet Take 17 g by mouth daily. 11/07/20  Yes Tegeler, Gwenyth Allegra, MD  potassium chloride (KLOR-CON) 10 MEQ tablet Take 1 tablet (10 mEq total) by mouth every other day. IN THE MORNING 10/25/19  Yes Gerlene Fee, NP  pravastatin (PRAVACHOL) 10 MG tablet Take 10 mg by mouth daily. 03/27/20  Yes [provider]  torsemide (DEMADEX) 10 MG tablet Take 20 mg by mouth daily.   Yes [provider]  vitamin C (ASCORBIC ACID) 500 MG tablet Take 500 mg by mouth daily.   Yes [provider]    Allergies as of 11/11/2020 - Review Complete 11/11/2020  Allergen Reaction Noted   Flexeril [cyclobenzaprine] Anaphylaxis 01/12/2012   Other Anaphylaxis and Other (See Comments) 02/08/2011   Keflex [cephalexin] Itching 05/26/2012   Aspirin Other (See Comments) 05/05/2015   Fentanyl Other (See Comments) 11/09/2020   Adhesive [tape] Rash 11/01/2014   Chlorhexidine Rash 11/19/2014    Family History  Problem  Relation Age of Onset   Breast cancer Sister    Colon cancer Sister        two deceased, one living and terminal, one current undergoing treatment, ages 39, 22, 67, 70   Hypertension Mother    Stroke Mother    Heart attack Mother    Hypertension Father    Prostate cancer Father     Social History   Socioeconomic History   Marital status: Single    Spouse name: Not on file   Number  of children: 3   Years of education: Not on file   Highest education level: Not on file  Occupational History   Not on file  Tobacco Use   Smoking status: Former    Packs/day: 2.00    Years: 40.00    Pack years: 80.00    Types: Cigarettes    Quit date: 01/19/1992    Years since quitting: 28.8   Smokeless tobacco: Never  Vaping Use   Vaping Use: Never used  Substance and Sexual Activity   Alcohol use: No   Drug use: No   Sexual activity: Never  Other Topics Concern   Not on file  Social History Narrative   Not on file   Social Determinants of Health   Financial Resource Strain: Not on file  Food Insecurity: Not on file  Transportation Needs: Not on file  Physical Activity: Not on file  Stress: Not on file  Social Connections: Not on file  Intimate Partner Violence: Not on file    Review of Systems: See HPI, otherwise negative ROS  Physical Exam: BP 134/88   Pulse (!) 110   Temp (!) 97 F (36.1 C)   Ht 5' (1.524 m)   Wt 182 lb 3.2 oz (82.6 kg)   BMI 35.58 kg/m  General:   Distressed, tearful lady found sitting in the chair doubled over in the examination room.   Lungs:  Clear throughout to auscultation.   No wheezes, crackles, or rhonchi. No acute distress. Heart:  Regular rate and rhythm; no murmurs, clicks, rubs,  or gallops. Abdomen: Obese.  Positive bowel sounds.  Abdomen is soft.  She has localized, nearly point tenderness mid right lower quadrant.  I do feel a vague "knot" in the subcutaneous tissues.  She has a small periumbilical hernia palpable.  This area is entirely  nontender.  She does not have rebound.  She does have an equivalent Carnett's sign when she goes slowly from sitting to supine.  Deep palpation of her other abdominal quadrants entirely nontender without mass or other abnormality Pulses:  Normal pulses noted. Extremities:  Without clubbing or edema. Rectal: No impaction.  Scant brown stool in the rectal vault is Hemoccult positive  Impression/Plan: Very pleasant 80 year old lady with multiple medical problems here to be evaluated for a 1 week history of 7-8 /10 right lower quadrant abdominal pain - unrelenting since it started acutely a week ago.  She has been to the ED twice in the last several days and had extensive lab work.  No obvious reason for pain found.  Small umbilical hernia appears to be an innocent "bystander" finding.  No evidence of appendicitis or renal stone disease.  Although she has some atherosclerosis on CT, her presentation and physical findings not consistent with an ischemic process.  I wonder about an occult spigelian type hernia or possibly a cutaneous nerve entrapment syndrome.  Doubt early zoster without a rash to this point in the illness.  Etiology of her pain is not clear.  She is obviously distressed.  Hemoccult positive stool may or may not be related to her acute illness.  I reached out to Dr. Arnoldo Morale and discussed the case.  We mutually agreed patient needs to be at least admitted for pain control as further evaluation is undertaken.  I have spoken to Dr. Heath Lark the case and he has kindly agreed to admit.  Currently no beds.  She will be instructed to come to the hospital once a bed becomes available.  Patient is agreeable to our plan.  Dr. Arnoldo Morale will see after she is admitted.   Notice: This dictation was prepared with Dragon dictation along with smaller phrase technology. Any transcriptional errors that result from this process are unintentional and may not be corrected upon review.

## 2020-11-12 ENCOUNTER — Encounter (HOSPITAL_COMMUNITY)
Admission: AD | Disposition: A | Discharge status: Home / Self Care | Payer: Self-pay | Source: Ambulatory Visit | Attending: General Surgery

## 2020-11-12 ENCOUNTER — Inpatient Hospital Stay (HOSPITAL_COMMUNITY): Payer: PPO | Admitting: Anesthesiology

## 2020-11-12 ENCOUNTER — Encounter (HOSPITAL_COMMUNITY): Payer: Self-pay | Admitting: Internal Medicine

## 2020-11-12 HISTORY — PX: INCISIONAL HERNIA REPAIR: SHX193

## 2020-11-12 LAB — CBC
HCT: 37.4 % (ref 36.0–46.0)
Hemoglobin: 11.5 g/dL — ABNORMAL LOW (ref 12.0–15.0)
MCH: 24.9 pg — ABNORMAL LOW (ref 26.0–34.0)
MCHC: 30.7 g/dL (ref 30.0–36.0)
MCV: 81.1 fL (ref 80.0–100.0)
Platelets: 269 10*3/uL (ref 150–400)
RBC: 4.61 MIL/uL (ref 3.87–5.11)
RDW: 15.3 % (ref 11.5–15.5)
WBC: 7.5 10*3/uL (ref 4.0–10.5)
nRBC: 0 % (ref 0.0–0.2)

## 2020-11-12 LAB — COMPREHENSIVE METABOLIC PANEL
ALT: 18 U/L (ref 0–44)
AST: 22 U/L (ref 15–41)
Albumin: 3.3 g/dL — ABNORMAL LOW (ref 3.5–5.0)
Alkaline Phosphatase: 44 U/L (ref 38–126)
Anion gap: 10 (ref 5–15)
BUN: 12 mg/dL (ref 8–23)
CO2: 31 mmol/L (ref 22–32)
Calcium: 7.5 mg/dL — ABNORMAL LOW (ref 8.9–10.3)
Chloride: 93 mmol/L — ABNORMAL LOW (ref 98–111)
Creatinine, Ser: 1.26 mg/dL — ABNORMAL HIGH (ref 0.44–1.00)
GFR, Estimated: 44 mL/min — ABNORMAL LOW (ref >60)
Glucose, Bld: 131 mg/dL — ABNORMAL HIGH (ref 70–99)
Potassium: 3.3 mmol/L — ABNORMAL LOW (ref 3.5–5.1)
Sodium: 134 mmol/L — ABNORMAL LOW (ref 135–145)
Total Bilirubin: 0.5 mg/dL (ref 0.3–1.2)
Total Protein: 6.2 g/dL — ABNORMAL LOW (ref 6.5–8.1)

## 2020-11-12 LAB — URINE CULTURE: Culture: 40000 — AB

## 2020-11-12 LAB — SURGICAL PCR SCREEN
MRSA, PCR: NEGATIVE
Staphylococcus aureus: NEGATIVE

## 2020-11-12 LAB — MAGNESIUM: Magnesium: 2.4 mg/dL (ref 1.7–2.4)

## 2020-11-12 SURGERY — REPAIR, HERNIA, INCISIONAL
Anesthesia: General | Site: Abdomen

## 2020-11-12 MED ORDER — MIDAZOLAM HCL 2 MG/2ML IJ SOLN
INTRAMUSCULAR | Status: AC
  Filled 2020-11-12: qty 2

## 2020-11-12 MED ORDER — SUCCINYLCHOLINE CHLORIDE 200 MG/10ML IV SOSY
PREFILLED_SYRINGE | INTRAVENOUS | Status: DC | PRN
  Administered 2020-11-12: 130 mg via INTRAVENOUS

## 2020-11-12 MED ORDER — SIMETHICONE 80 MG PO CHEW: 40.0000 mg | CHEWABLE_TABLET | Freq: Four times a day (QID) | ORAL | Status: DC | PRN

## 2020-11-12 MED ORDER — MORPHINE SULFATE (PF) 2 MG/ML IV SOLN
1.0000 mg | INTRAVENOUS | Status: DC | PRN
  Administered 2020-11-12 (×2): 2 mg via INTRAVENOUS
  Filled 2020-11-12 (×2): qty 1

## 2020-11-12 MED ORDER — LACTATED RINGERS IV SOLN
INTRAVENOUS | Status: DC
  Administered 2020-11-12: 1000 mL via INTRAVENOUS

## 2020-11-12 MED ORDER — TRAMADOL HCL 50 MG PO TABS
50.0000 mg | ORAL_TABLET | Freq: Four times a day (QID) | ORAL | Status: DC | PRN
  Administered 2020-11-12 – 2020-11-13 (×2): 50 mg via ORAL
  Filled 2020-11-12 (×2): qty 1

## 2020-11-12 MED ORDER — SUCCINYLCHOLINE CHLORIDE 200 MG/10ML IV SOSY
PREFILLED_SYRINGE | INTRAVENOUS | Status: AC
  Filled 2020-11-12: qty 10

## 2020-11-12 MED ORDER — ROCURONIUM BROMIDE 10 MG/ML (PF) SYRINGE
PREFILLED_SYRINGE | INTRAVENOUS | Status: AC
  Filled 2020-11-12: qty 10

## 2020-11-12 MED ORDER — 0.9 % SODIUM CHLORIDE (POUR BTL) OPTIME
TOPICAL | Status: DC | PRN
  Administered 2020-11-12: 1000 mL

## 2020-11-12 MED ORDER — DEXAMETHASONE SODIUM PHOSPHATE 10 MG/ML IJ SOLN
INTRAMUSCULAR | Status: AC
  Filled 2020-11-12: qty 1

## 2020-11-12 MED ORDER — BUPIVACAINE LIPOSOME 1.3 % IJ SUSP
INTRAMUSCULAR | Status: AC
  Filled 2020-11-12: qty 20

## 2020-11-12 MED ORDER — ROCURONIUM BROMIDE 10 MG/ML (PF) SYRINGE
PREFILLED_SYRINGE | INTRAVENOUS | Status: DC | PRN
  Administered 2020-11-12: 40 mg via INTRAVENOUS

## 2020-11-12 MED ORDER — KCL IN DEXTROSE-NACL 40-5-0.45 MEQ/L-%-% IV SOLN: INTRAVENOUS | Status: DC

## 2020-11-12 MED ORDER — FENTANYL CITRATE (PF) 100 MCG/2ML IJ SOLN
INTRAMUSCULAR | Status: DC | PRN
  Administered 2020-11-12 (×2): 50 ug via INTRAVENOUS

## 2020-11-12 MED ORDER — KETOROLAC TROMETHAMINE 15 MG/ML IJ SOLN
INTRAMUSCULAR | Status: AC
  Filled 2020-11-12: qty 1

## 2020-11-12 MED ORDER — MORPHINE SULFATE (PF) 4 MG/ML IV SOLN
3.0000 mg | Freq: Once | INTRAVENOUS | Status: AC
  Administered 2020-11-12: 3 mg via INTRAVENOUS
  Filled 2020-11-12: qty 1

## 2020-11-12 MED ORDER — SUGAMMADEX SODIUM 200 MG/2ML IV SOLN
INTRAVENOUS | Status: DC | PRN
  Administered 2020-11-12: 200 mg via INTRAVENOUS

## 2020-11-12 MED ORDER — VANCOMYCIN HCL IN DEXTROSE 1-5 GM/200ML-% IV SOLN
1000.0000 mg | INTRAVENOUS | Status: AC
  Administered 2020-11-12: 1000 mg via INTRAVENOUS
  Filled 2020-11-12: qty 200

## 2020-11-12 MED ORDER — BUPIVACAINE LIPOSOME 1.3 % IJ SUSP
INTRAMUSCULAR | Status: DC | PRN
  Administered 2020-11-12: 20 mL

## 2020-11-12 MED ORDER — LIDOCAINE HCL (PF) 2 % IJ SOLN
INTRAMUSCULAR | Status: AC
  Filled 2020-11-12: qty 5

## 2020-11-12 MED ORDER — KETOROLAC TROMETHAMINE 15 MG/ML IJ SOLN
15.0000 mg | Freq: Once | INTRAMUSCULAR | Status: AC
  Administered 2020-11-12: 15 mg via INTRAVENOUS

## 2020-11-12 MED ORDER — PHENOL 1.4 % MT LIQD: 1.0000 | OROMUCOSAL | Status: DC | PRN

## 2020-11-12 MED ORDER — KETAMINE HCL 10 MG/ML IJ SOLN
INTRAMUSCULAR | Status: DC | PRN
  Administered 2020-11-12: 20 mg via INTRAVENOUS

## 2020-11-12 MED ORDER — SODIUM CHLORIDE 0.9 % IV SOLN: INTRAVENOUS | Status: DC

## 2020-11-12 MED ORDER — FENTANYL CITRATE (PF) 100 MCG/2ML IJ SOLN
INTRAMUSCULAR | Status: AC
  Filled 2020-11-12: qty 2

## 2020-11-12 MED ORDER — KETAMINE HCL 50 MG/5ML IJ SOSY
PREFILLED_SYRINGE | INTRAMUSCULAR | Status: AC
  Filled 2020-11-12: qty 5

## 2020-11-12 MED ORDER — PROPOFOL 10 MG/ML IV BOLUS
INTRAVENOUS | Status: AC
  Filled 2020-11-12: qty 20

## 2020-11-12 MED ORDER — ONDANSETRON HCL 4 MG/2ML IJ SOLN
INTRAMUSCULAR | Status: AC
  Filled 2020-11-12: qty 2

## 2020-11-12 MED ORDER — IPRATROPIUM-ALBUTEROL 0.5-2.5 (3) MG/3ML IN SOLN
3.0000 mL | RESPIRATORY_TRACT | Status: DC
  Administered 2020-11-12: 3 mL via RESPIRATORY_TRACT

## 2020-11-12 MED ORDER — IPRATROPIUM-ALBUTEROL 0.5-2.5 (3) MG/3ML IN SOLN
RESPIRATORY_TRACT | Status: AC
  Filled 2020-11-12: qty 3

## 2020-11-12 MED ORDER — PROPOFOL 10 MG/ML IV BOLUS
INTRAVENOUS | Status: DC | PRN
  Administered 2020-11-12: 70 mg via INTRAVENOUS
  Administered 2020-11-12: 130 mg via INTRAVENOUS

## 2020-11-12 MED ORDER — MIDAZOLAM HCL 5 MG/5ML IJ SOLN
INTRAMUSCULAR | Status: DC | PRN
  Administered 2020-11-12 (×2): 1 mg via INTRAVENOUS

## 2020-11-12 SURGICAL SUPPLY — 29 items
ADH SKN CLS APL DERMABOND .7 (GAUZE/BANDAGES/DRESSINGS) ×1
BLADE SURG SZ11 CARB STEEL (BLADE) ×2 IMPLANT
CLOTH BEACON ORANGE TIMEOUT ST (SAFETY) ×2 IMPLANT
COVER LIGHT HANDLE STERIS (MISCELLANEOUS) ×4 IMPLANT
DERMABOND ADVANCED (GAUZE/BANDAGES/DRESSINGS) ×1
DERMABOND ADVANCED .7 DNX12 (GAUZE/BANDAGES/DRESSINGS) ×1 IMPLANT
DURAPREP 26ML APPLICATOR (WOUND CARE) ×1 IMPLANT
ELECT REM PT RETURN 9FT ADLT (ELECTROSURGICAL) ×2
ELECTRODE REM PT RTRN 9FT ADLT (ELECTROSURGICAL) ×1 IMPLANT
GLOVE SURG POLYISO LF SZ7.5 (GLOVE) ×2 IMPLANT
GLOVE SURG UNDER POLY LF SZ7 (GLOVE) ×4 IMPLANT
GOWN STRL REUS W/TWL LRG LVL3 (GOWN DISPOSABLE) ×6 IMPLANT
INST SET MINOR GENERAL (KITS) ×1 IMPLANT
KIT TURNOVER KIT A (KITS) ×2 IMPLANT
MANIFOLD NEPTUNE II (INSTRUMENTS) ×2 IMPLANT
MESH VENTRALEX ST 1-7/10 CRC S (Mesh General) ×1 IMPLANT
NDL HYPO 21X1.5 SAFETY (NEEDLE) ×1 IMPLANT
NEEDLE HYPO 21X1.5 SAFETY (NEEDLE) ×2 IMPLANT
NS IRRIG 1000ML POUR BTL (IV SOLUTION) ×2 IMPLANT
PACK MINOR (CUSTOM PROCEDURE TRAY) ×1 IMPLANT
PAD ARMBOARD 7.5X6 YLW CONV (MISCELLANEOUS) ×2 IMPLANT
PENCIL SMOKE EVACUATOR (MISCELLANEOUS) ×2 IMPLANT
SUT ETHIBOND 0 MO6 C/R (SUTURE) ×1 IMPLANT
SUT MNCRL AB 4-0 PS2 18 (SUTURE) ×2 IMPLANT
SUT VIC AB 2-0 CT1 27 (SUTURE) ×2
SUT VIC AB 2-0 CT1 TAPERPNT 27 (SUTURE) ×1 IMPLANT
SUT VIC AB 3-0 SH 27 (SUTURE) ×2
SUT VIC AB 3-0 SH 27X BRD (SUTURE) ×1 IMPLANT
SYR 20ML LL LF (SYRINGE) ×4 IMPLANT

## 2020-11-12 NOTE — Progress Notes (Signed)
Initial Nutrition Assessment  DOCUMENTATION CODES:   Obesity unspecified  INTERVENTION:  Heart Healthy diet   Snacks between meals    NUTRITION DIAGNOSIS:   Inadequate oral intake related to acute illness (abdominal pain - s/p incisional hernia repair) as evidenced by per patient/family report (10-12 lb wt loss within the past 2 weeks).   GOAL:  Patient will meet greater than or equal to 90% of their needs   MONITOR:  PO intake, Labs, Skin, Weight trends  REASON FOR ASSESSMENT:   Malnutrition Screening Tool    ASSESSMENT: Patient is a 80 yo female with history of GERD, CKD-3, COPD, Cushing syndrome, Iron deficiency anemia. Presents with complaint of abdominal pain.   Status post incisional hernia repair today. Patient has returned to room and diet advanced.  Patient awake and asking for chicken noodle soup and crackers. Nutrition services contacted and snack ordered. Patient doesn't eat beef, pork or fish and prefers decaf coffee for beverage.   Patient reports 10-12 lb wt loss during recent abdominal pain. She has not been able to eat much due to the discomfort.   Medications reviewed and include: vitamin C, Protonix.   IVF-NS@ 75 ml/hr  Labs: BMP Latest Ref Rng & Units 11/12/2020 11/11/2020 11/09/2020  Glucose 70 - 99 mg/dL 131(H) - 108(H)  BUN 8 - 23 mg/dL 12 - 16  Creatinine 0.44 - 1.00 mg/dL 1.26(H) 1.65(H) 1.28(H)  Sodium 135 - 145 mmol/L 134(L) - 134(L)  Potassium 3.5 - 5.1 mmol/L 3.3(L) - 3.3(L)  Chloride 98 - 111 mmol/L 93(L) - 92(L)  CO2 22 - 32 mmol/L 31 - 31  Calcium 8.9 - 10.3 mg/dL 7.5(L) - 8.9      NUTRITION - FOCUSED PHYSICAL EXAM: Nutrition-Focused physical exam completed. Findings are no fat depletion, mild clavicle muscle depletion, and no edema.     Diet Order:   Diet Order             Diet Heart Room service appropriate? Yes; Fluid consistency: Thin  Diet effective now                   EDUCATION NEEDS:  Education needs have  been addressed  Skin:  Skin Assessment: Skin Integrity Issues: Skin Integrity Issues:: Incisions Incisions: s/p hernia repair 11/12/20  Last BM:  8/29  Height:   Ht Readings from Last 1 Encounters:  11/12/20 5' (1.524 m)    Weight:   Wt Readings from Last 1 Encounters:  11/12/20 82.6 kg    Ideal Body Weight:   45 kg  BMI:  Body mass index is 35.56 kg/m.  Estimated Nutritional Needs:   Kcal:  1700-1800  Protein:  90-99 gr  Fluid:  1.7-1.8 liters daily   Colman Cater MS,RD,CSG,LDN Contact: Shea Evans

## 2020-11-12 NOTE — Anesthesia Procedure Notes (Signed)
Procedure Name: Intubation Date/Time: 11/12/2020 11:08 AM Performed by: Tacy Learn, CRNA Pre-anesthesia Checklist: Patient identified, Emergency Drugs available, Suction available, Patient being monitored and Timeout performed Patient Re-evaluated:Patient Re-evaluated prior to induction Oxygen Delivery Method: Circle system utilized Preoxygenation: Pre-oxygenation with 100% oxygen Induction Type: IV induction Laryngoscope Size: Miller and 2 Grade View: Grade I Tube type: Oral Number of attempts: 2 Airway Equipment and Method: Stylet Placement Confirmation: ETT inserted through vocal cords under direct vision, positive ETCO2, CO2 detector and breath sounds checked- equal and bilateral Secured at: 21 cm Tube secured with: Tape Dental Injury: Teeth and Oropharynx as per pre-operative assessment  Comments: Med student attempted to intubate.

## 2020-11-12 NOTE — Transfer of Care (Signed)
Immediate Anesthesia Transfer of Care Note  Patient: Glenda Terry  Procedure(s) Performed: HERNIA REPAIR INCISIONAL (Abdomen)  Patient Location: PACU  Anesthesia Type:General  Level of Consciousness: awake, alert , oriented, patient cooperative and responds to stimulation  Airway & Oxygen Therapy: Patient Spontanous Breathing  Post-op Assessment: Report given to RN, Post -op Vital signs reviewed and stable and Patient moving all extremities X 4  Post vital signs: Reviewed and stable  Last Vitals:  Vitals Value Taken Time  BP 94/82 11/12/20 1201  Temp    Pulse 85 11/12/20 1205  Resp 26 11/12/20 1205  SpO2 100 % 11/12/20 1205  Vitals shown include unvalidated device data.  Last Pain:  Vitals:   11/12/20 0959  TempSrc: Oral  PainSc: 6       Patients Stated Pain Goal: 6 ([id number])  Complications: No notable events documented.

## 2020-11-12 NOTE — Progress Notes (Signed)
Pt notified of ongoing confidential encounter designation and that her daughter was in lobby asking to come up to room. Pt states she does not  know why she is on confidential status, states she does not want this to continue. Pt asking to see daughter. Daughter allowed up and in room with mother. Daughter advised of confidential status and she says she does not know why pt has this designation, as she brought her mother to the facility and got her registered. Daughter also asks for this designation to be removed. Registration notified of pt request and confidential designation removed.  Pt taken to OR via stretcher by pre-op staff and with daughter at bedside.

## 2020-11-12 NOTE — Interval H&P Note (Signed)
History and Physical Interval Note:  11/12/2020 10:40 AM  Glenda Terry  has presented today for surgery, with the diagnosis of incisional hernia.  The various methods of treatment have been discussed with the patient and family. After consideration of risks, benefits and other options for treatment, the patient has consented to  Procedure(s): HERNIA REPAIR INCISIONAL (N/A) as a surgical intervention.  The patient's history has been reviewed, patient examined, no change in status, stable for surgery.  I have reviewed the patient's chart and labs.  Questions were answered to the patient's satisfaction.     Aviva Signs

## 2020-11-12 NOTE — Anesthesia Preprocedure Evaluation (Signed)
Anesthesia Evaluation  Patient identified by MRN, date of birth, ID band Patient awake    Reviewed: Allergy & Precautions, H&P , NPO status , Patient's Chart, lab work & pertinent test results, reviewed documented beta blocker date and time   Airway Mallampati: II  TM Distance: >3 FB Neck ROM: full    Dental no notable dental hx. (+) Edentulous Lower, Edentulous Upper   Pulmonary asthma , COPD,  COPD inhaler, former smoker,    Pulmonary exam normal breath sounds clear to auscultation       Cardiovascular Exercise Tolerance: Good hypertension, negative cardio ROS   Rhythm:regular Rate:Normal     Neuro/Psych Anxiety negative neurological ROS  negative psych ROS   GI/Hepatic Neg liver ROS, GERD  Medicated,  Endo/Other  negative endocrine ROS  Renal/GU CRFRenal disease  negative genitourinary   Musculoskeletal   Abdominal   Peds  Hematology negative hematology ROS (+) anemia ,   Anesthesia Other Findings 1. Left ventricular ejection fraction, by estimation, is 60 to 65%. The left ventricle has normal function. The left ventricle has no regional wall motion abnormalities. Left ventricular diastolic parameters are consistent with Grade I diastolic dysfunction (impaired relaxation). 2. Right ventricular systolic function is normal. The right ventricular size is normal. 3. The mitral valve is normal in structure. No evidence of mitral valve regurgitation. 4. The aortic valve is tricuspid. Aortic valve regurgitation is not visualized. No aortic stenosis is present. 5. The inferior vena cava is normal in size with greater than 50% respiratory variability, suggesting right atrial pressure of 3 mmHg. Comparison(s): A prior study was performed on 09/22/2015. No significant change from prior study. Prior images reviewed side by side.  Reproductive/Obstetrics negative OB ROS                              Anesthesia Physical  Anesthesia Plan  ASA: 3  Anesthesia Plan: General   Post-op Pain Management:    Induction:   PONV Risk Score and Plan: Ondansetron  Airway Management Planned:   Additional Equipment:   Intra-op Plan:   Post-operative Plan:   Informed Consent: I have reviewed the patients History and Physical, chart, labs and discussed the procedure including the risks, benefits and alternatives for the proposed anesthesia with the patient or authorized representative who has indicated his/her understanding and acceptance.     Dental Advisory Given  Plan Discussed with: CRNA  Anesthesia Plan Comments:         Anesthesia Quick Evaluation

## 2020-11-12 NOTE — Progress Notes (Signed)
Patient seen and examined.  Hemodynamically stable and is still complaining of periumbilical abdominal discomfort.  IV analgesics ordered.  Plan is for surgical repair of incisional not incarcerated hernia better today.  Chronic medical problems are stable and after discussing with general surgery service plan is for patient to be transfer to surgical team for postoperative care.  Internal medicine will be available if needed to further assist with her care as required.  Barton Dubois MD [phone]

## 2020-11-12 NOTE — Anesthesia Postprocedure Evaluation (Signed)
Anesthesia Post Note  Patient: Glenda Terry  Procedure(s) Performed: HERNIA REPAIR INCISIONAL (Abdomen)  Patient location during evaluation: Phase II Anesthesia Type: General Level of consciousness: awake Pain management: pain level controlled Vital Signs Assessment: post-procedure vital signs reviewed and stable Respiratory status: spontaneous breathing and respiratory function stable Cardiovascular status: blood pressure returned to baseline and stable Postop Assessment: no headache and no apparent nausea or vomiting Anesthetic complications: no Comments: Late entry   No notable events documented.   Last Vitals:  Vitals:   11/12/20 0535 11/12/20 0959  BP: 116/70 112/75  Pulse: 83 81  Resp: 20 14  Temp: 36.8 C 36.9 C  SpO2: 98% 96%    Last Pain:  Vitals:   11/12/20 0959  TempSrc: Oral  PainSc: [place]

## 2020-11-12 NOTE — Op Note (Signed)
Patient:  Glenda Terry  DOB:  Aug 12, 1940  MRN:  [id number]   Preop Diagnosis: Incisional hernia  Postop Diagnosis: Same  Procedure: Incisional herniorrhaphy with mesh  Surgeon: Aviva Signs, MD  Anes: General endotracheal  Indications: Patient is a 80 year old black female who presents with an incisional hernia of the umbilicus.  She has had acute lower abdominal pain of unknown etiology.  CT scan of the abdomen revealed an umbilical/incisional hernia.  The risks and benefits of the procedure including bleeding, infection, mesh use, and the possibility of recurrence of the pain were fully explained to the patient, who gave informed consent.  Procedure note: The patient was placed in the supine position.  After induction of general endotracheal anesthesia, the abdomen was prepped and draped using the usual sterile technique with ChloraPrep.  Surgical site confirmation was performed.  An infraumbilical incision was made down to the fascia.  The patient had a long stalk of hernia sac extending to the base of the umbilicus.  The umbilicus was freed away from the hernia sac.  A small amount of omentum and falciform ligament were found and these were freed away from the hernia sac.  The hernia sac was then excised down to the base.  I palpated the anterior abdominal wall, especially along the right lower quadrant and no hernia defects were noted.  A 4.3 cm Bard Ventralax ST patch was then inserted and secured to the fascia using 0 Ethibond interrupted sutures.  The overlying fascia was reapproximated using 0 Ethibond interrupted sutures transversely.  The base the umbilicus was secured back to the fascia using 2-0 Vicryl interrupted suture.  The subcutaneous layer was reapproximated using 3-0 Vicryl interrupted suture.  Exparel was instilled into the surrounding wound.  The skin was closed using a 4-0 Monocryl subcuticular suture.  Dermabond was applied.  All tape and needle counts were correct  at the end of the procedure.  The patient was extubated in the operating room and transferred to PACU in stable condition.  Complications: None  EBL: Minimal  Specimen: None

## 2020-11-13 ENCOUNTER — Telehealth: Payer: Self-pay | Admitting: *Deleted

## 2020-11-13 LAB — BASIC METABOLIC PANEL
Anion gap: 5 (ref 5–15)
BUN: 11 mg/dL (ref 8–23)
CO2: 26 mmol/L (ref 22–32)
Calcium: 7.5 mg/dL — ABNORMAL LOW (ref 8.9–10.3)
Chloride: 102 mmol/L (ref 98–111)
Creatinine, Ser: 0.95 mg/dL (ref 0.44–1.00)
GFR, Estimated: >60 mL/min (ref >60)
Glucose, Bld: 150 mg/dL — ABNORMAL HIGH (ref 70–99)
Potassium: 3.5 mmol/L (ref 3.5–5.1)
Sodium: 133 mmol/L — ABNORMAL LOW (ref 135–145)

## 2020-11-13 LAB — CBC
HCT: 34 % — ABNORMAL LOW (ref 36.0–46.0)
Hemoglobin: 10.3 g/dL — ABNORMAL LOW (ref 12.0–15.0)
MCH: 24.5 pg — ABNORMAL LOW (ref 26.0–34.0)
MCHC: 30.3 g/dL (ref 30.0–36.0)
MCV: 80.8 fL (ref 80.0–100.0)
Platelets: 240 10*3/uL (ref 150–400)
RBC: 4.21 MIL/uL (ref 3.87–5.11)
RDW: 15.1 % (ref 11.5–15.5)
WBC: 10.9 10*3/uL — ABNORMAL HIGH (ref 4.0–10.5)
nRBC: 0 % (ref 0.0–0.2)

## 2020-11-13 NOTE — Discharge Summary (Signed)
Physician Discharge Summary  Patient ID: Glenda Terry MRN: [id number] DOB/AGE: 03/22/1940 80 y.o.  Admit date: 11/11/2020 Discharge date: 11/13/2020  Admission Diagnoses: Incisional hernia, abdominal pain  Discharge Diagnoses: Same Active Problems:   Abdominal pain   Incisional hernia, without obstruction or gangrene   Discharged Condition: good  Hospital Course: Patient is a 80 year old black female who was admitted to the hospital service for evaluation treatment of severe abdominal pain.  Surgery was consulted.  She was noted to have an incisional hernia at her umbilicus.  While trying to reduce this hernia, this reproduced the patient's pain.  She subsequently underwent an incisional herniorrhaphy with mesh on 11/12/2020.  She tolerated the procedure well.  Her postoperative course was unremarkable.  She states her preoperative symptoms have resolved.  She is being discharged home on 11/13/2020 in good and improving condition.  Treatments: surgery: Incisional herniorrhaphy with mesh on 11/12/2020  Discharge Exam: Blood pressure (!) 147/66, pulse 66, temperature 97.8 F (36.6 C), temperature source Oral, resp. rate 17, height 5' (1.524 m), weight 82.6 kg, SpO2 99 %. General appearance: alert, cooperative, and no distress Resp: clear to auscultation bilaterally Cardio: regular rate and rhythm, S1, S2 normal, no murmur, click, rub or gallop GI: Soft, incision healing well.  Disposition: Discharge disposition: 01-Home or Self Care       Discharge Instructions     Diet - low sodium heart healthy   Complete by: As directed    Increase activity slowly   Complete by: As directed       Allergies as of 11/13/2020       Reactions   Flexeril [cyclobenzaprine] Anaphylaxis   Other Anaphylaxis, Other (See Comments)   ALL TREE NUTS   Keflex [cephalexin] Itching   Aspirin Other (See Comments)   Cannot take due to ulcers   Fentanyl Other (See Comments)   Lip Swelling   Adhesive  [tape] Rash   Chlorhexidine Rash        Medication List     TAKE these medications    ALPRAZolam 0.5 MG tablet Commonly known as: XANAX Take 0.5 tablets (0.25 mg total) by mouth 2 (two) times daily as needed for anxiety.   amLODipine 5 MG tablet Commonly known as: NORVASC Take 1 tablet (5 mg total) by mouth daily.   APAP 500 PO Take 2 tablets by mouth 2 (two) times daily as needed.   Benralizumab 30 MG/ML Sosy Inject 30 mg into the skin. Once A Day Every 56 Days   Biofreeze 4 % Gel Generic drug: Menthol (Topical Analgesic) Apply 1 application topically 2 (two) times daily. Special Instructions: for back pain   bisacodyl 5 MG EC tablet Commonly known as: DULCOLAX Take 5 mg by mouth daily as needed for moderate constipation.   calcium carbonate 600 MG Tabs tablet Commonly known as: OS-CAL Take 600 mg by mouth 2 (two) times daily with a meal.   denosumab 60 MG/ML Sosy injection Commonly known as: PROLIA Inject 60 mg into the skin every 6 (six) months.   diphenhydrAMINE 25 mg capsule Commonly known as: BENADRYL Take 25 mg by mouth at bedtime.   Dulera 200-5 MCG/ACT Aero Generic drug: mometasone-formoterol Inhale 2 puffs into the lungs 2 (two) times daily.   esomeprazole 40 MG capsule Commonly known as: NEXIUM Take 1 capsule (40 mg total) by mouth 2 (two) times daily.   ferrous sulfate 325 (65 FE) MG tablet Take 1 tablet (325 mg total) by mouth daily with breakfast.  fexofenadine 180 MG tablet Commonly known as: ALLEGRA Take 180 mg by mouth daily.   fluticasone 50 MCG/ACT nasal spray Commonly known as: FLONASE Place 1 spray into both nostrils daily as needed for allergies or rhinitis.   ipratropium-albuterol 0.5-2.5 (3) MG/3ML Soln Commonly known as: DUONEB Take 3 mLs by nebulization every 4 (four) hours as needed.   ketotifen 0.025 % ophthalmic solution Commonly known as: ZADITOR Place 1 drop into both eyes 2 (two) times daily as needed  (allergies).   Magnesium 250 MG Tabs Take 250 mg by mouth daily.   montelukast 10 MG tablet Commonly known as: SINGULAIR Take 1 tablet (10 mg total) by mouth at bedtime.   nystatin 100000 UNIT/ML suspension Commonly known as: MYCOSTATIN Take 5 mLs (500,000 Units total) by mouth 4 (four) times daily.   ondansetron 4 MG disintegrating tablet Commonly known as: Zofran ODT Take 1 tablet (4 mg total) by mouth every 8 (eight) hours as needed for nausea or vomiting.   Oxycodone HCl 10 MG Tabs Take 10 mg by mouth every 6 (six) hours.   polyethylene glycol 17 g packet Commonly known as: MIRALAX / GLYCOLAX Take 17 g by mouth daily.   potassium chloride 10 MEQ tablet Commonly known as: KLOR-CON Take 1 tablet (10 mEq total) by mouth every other day. IN THE MORNING   pravastatin 10 MG tablet Commonly known as: PRAVACHOL Take 10 mg by mouth daily.   ProAir HFA 108 (90 Base) MCG/ACT inhaler Generic drug: albuterol Inhale 2 puffs into the lungs every 6 (six) hours as needed for wheezing or shortness of breath (For COPD).   SYSTANE OP Place 1 drop into both eyes 3 (three) times daily as needed (dry/irritated eyes.).   torsemide 10 MG tablet Commonly known as: DEMADEX Take 20 mg by mouth daily.   vitamin C 500 MG tablet Commonly known as: ASCORBIC ACID Take 500 mg by mouth daily.   vitamin D3 50 MCG (2000 UT) Caps Take 1 capsule by mouth daily.        Follow-up Information     Aviva Signs, MD Follow up.   Specialty: General Surgery Why: As needed.  Will call you next week for follow up Contact information: [address] [place] [id number]                 Signed: Aviva Signs 11/13/2020, 9:42 AM

## 2020-11-13 NOTE — Progress Notes (Signed)
Patient accidentally pulled out hand IV while going to the bathroom. Attempted to flush the remaining IV in the [facility] and it would not flush. Patient is asking that she not get stuck again and that she is eating and drinking fine. Patient has PO pain medicine if needed and was told she would be going home today.

## 2020-11-13 NOTE — Progress Notes (Signed)
Surgical site dry and intact with glue.  Ambulated to bathroom and had bm.  C/O pain was mainly chronic back pain.  Discharge instructions reviewed and daughter to transport home

## 2020-11-13 NOTE — Telephone Encounter (Signed)
Post ED Visit - Positive Culture Follow-up  Culture report reviewed by antimicrobial stewardship pharmacist: [facility] Team '[]'$  Elenor Quinones, Pharm.D. '[]'$  Heide Guile, Pharm.D., BCPS AQ-ID '[]'$  Parks Neptune, Pharm.D., BCPS '[]'$  Alycia Rossetti, Pharm.D., BCPS '[]'$  Morton, Pharm.D., BCPS, AAHIVP '[]'$  Legrand Como, Pharm.D., BCPS, AAHIVP '[]'$  Salome Arnt, PharmD, BCPS '[]'$  Johnnette Gourd, PharmD, BCPS '[]'$  Hughes Better, PharmD, BCPS '[]'$  Leeroy Cha, PharmD '[]'$  Laqueta Linden, PharmD, BCPS '[]'$  Albertina Parr, PharmD  [facility] Team '[]'$  Leodis Sias, PharmD '[]'$  Lindell Spar, PharmD '[]'$  Royetta Asal, PharmD '[]'$  Graylin Shiver, Rph '[]'$  Rema Fendt) Glennon Mac, PharmD '[]'$  Arlyn Dunning, PharmD '[]'$  Netta Cedars, PharmD '[]'$  Dia Sitter, PharmD '[]'$  Leone Haven, PharmD '[]'$  Gretta Arab, PharmD '[]'$  Theodis Shove, PharmD '[]'$  Peggyann Juba, PharmD '[]'$  Reuel Boom, PharmD   Positive urine culture No action needed and no further patient follow-up is required at this time.  [address] Pharm D  Ardeen Fillers 11/13/2020, 12:34 PM

## 2020-11-14 ENCOUNTER — Encounter (HOSPITAL_COMMUNITY): Payer: Self-pay | Admitting: General Surgery

## 2020-11-18 ENCOUNTER — Telehealth: Payer: Self-pay | Admitting: Internal Medicine

## 2020-11-18 ENCOUNTER — Telehealth: Payer: Self-pay | Admitting: Family Medicine

## 2020-11-18 NOTE — Telephone Encounter (Signed)
Pt called and states that she is in a lot of pain on her right side like before when she went to the ER when she had her surgery. I asked her to rate her pain and it is at a 10. I informed her if pain was a 10 she needed to go to the ER and she states that she does not wan to go to the ER as she was there before her surgery. She has taken an Oxycodone '10mg'$  at 12:45 and '1000mg'$  of Tylenol at 11:30 with no relief. She has an ice pack on it now.   Per Dr. Arnoldo Morale pt NTBS as he is out of options for the pt. Called pt and made aware of recommendations and scheduled her an apt for Thursday 11/20/2020. She was very appreciative of call and apt.

## 2020-11-18 NOTE — Telephone Encounter (Signed)
Pt was seen recently and sent to the ER. She called today saying that Dr Arnoldo Morale did a procedure on her and now her pain is back. I asked if she called Dr Arnoldo Morale' office and she said she can't get anyone to call her back and doesn't know what to do. Please advise/call [phone]

## 2020-11-18 NOTE — Telephone Encounter (Signed)
Pt returned call and informed me that she was able to get in contact with Dr. Arnoldo Morale office and she has an appt with him on Thursday to address the pain in her abdomen.

## 2020-11-18 NOTE — Telephone Encounter (Signed)
Tried to return pt's call, no vm/no ans.

## 2020-11-20 ENCOUNTER — Other Ambulatory Visit: Payer: Self-pay

## 2020-11-20 ENCOUNTER — Encounter: Payer: Self-pay | Admitting: General Surgery

## 2020-11-20 ENCOUNTER — Ambulatory Visit (INDEPENDENT_AMBULATORY_CARE_PROVIDER_SITE_OTHER): Payer: PPO | Admitting: General Surgery

## 2020-11-20 VITALS — BP 139/68 | HR 118 | Temp 98.3°F | Resp 18 | Ht 60.0 in | Wt 184.0 lb

## 2020-11-20 DIAGNOSIS — Z09 Encounter for follow-up examination after completed treatment for conditions other than malignant neoplasm: Secondary | ICD-10-CM

## 2020-11-20 NOTE — Progress Notes (Signed)
Subjective:     Glenda Terry  Patient here for follow-up, status post umbilical herniorrhaphy with mesh.  She states her right-sided flank pain seems to have returned.  It is made worse with some movement.  She also states it sometimes radiates from the back to the front.  She also describes some tenderness along the lumbar spine.  No nausea or vomiting have been noted. Objective:    BP 139/68   Pulse (!) 118   Temp 98.3 F (36.8 C) (Other (Comment))   Resp 18   Ht 5' (1.524 m)   Wt 184 lb (83.5 kg)   SpO2 93%   BMI 35.94 kg/m   General:  alert, cooperative, and no distress  Abdomen is soft.  She points to an area of tenderness that is lateral and superior to her previous area of tenderness to my examination.  Her incision has healed well.     Assessment:    Recurrent abdominal pain of unknown etiology.  This does not seem to be related to her umbilical hernia repair.  She is happy that she did get the hernia fixed.    Plan:   I think her abdominal pain is multifactorial in nature.  It may be related to her back pain.  She states that she has been told that nothing can be done for her back pain.  I reassured her that there is no intra-abdominal reason for her pain.  She is present with her daughter.  They both understand this and agree.  Follow-up here as needed.  I told her to see her primary care physician as there may be other options in terms of treating her back pain including physical therapy.

## 2020-11-25 DIAGNOSIS — M5136 Other intervertebral disc degeneration, lumbar region: Secondary | ICD-10-CM | POA: Diagnosis not present

## 2020-11-25 DIAGNOSIS — M961 Postlaminectomy syndrome, not elsewhere classified: Secondary | ICD-10-CM | POA: Diagnosis not present

## 2020-11-25 DIAGNOSIS — M4180 Other forms of scoliosis, site unspecified: Secondary | ICD-10-CM | POA: Diagnosis not present

## 2020-11-25 DIAGNOSIS — G894 Chronic pain syndrome: Secondary | ICD-10-CM | POA: Diagnosis not present

## 2020-11-25 DIAGNOSIS — M5459 Other low back pain: Secondary | ICD-10-CM | POA: Diagnosis not present

## 2020-11-28 DIAGNOSIS — E211 Secondary hyperparathyroidism, not elsewhere classified: Secondary | ICD-10-CM | POA: Diagnosis not present

## 2020-11-28 DIAGNOSIS — E876 Hypokalemia: Secondary | ICD-10-CM | POA: Diagnosis not present

## 2020-11-28 DIAGNOSIS — I5032 Chronic diastolic (congestive) heart failure: Secondary | ICD-10-CM | POA: Diagnosis not present

## 2020-11-28 DIAGNOSIS — N1831 Chronic kidney disease, stage 3a: Secondary | ICD-10-CM | POA: Diagnosis not present

## 2020-11-28 DIAGNOSIS — I129 Hypertensive chronic kidney disease with stage 1 through stage 4 chronic kidney disease, or unspecified chronic kidney disease: Secondary | ICD-10-CM | POA: Diagnosis not present

## 2020-12-05 DIAGNOSIS — R109 Unspecified abdominal pain: Secondary | ICD-10-CM | POA: Diagnosis not present

## 2020-12-05 DIAGNOSIS — R3911 Hesitancy of micturition: Secondary | ICD-10-CM | POA: Diagnosis not present

## 2020-12-05 DIAGNOSIS — N390 Urinary tract infection, site not specified: Secondary | ICD-10-CM | POA: Diagnosis not present

## 2020-12-05 DIAGNOSIS — Z9889 Other specified postprocedural states: Secondary | ICD-10-CM | POA: Diagnosis not present

## 2020-12-05 DIAGNOSIS — M545 Low back pain, unspecified: Secondary | ICD-10-CM | POA: Diagnosis not present

## 2020-12-12 DIAGNOSIS — M545 Low back pain, unspecified: Secondary | ICD-10-CM | POA: Diagnosis not present

## 2020-12-12 DIAGNOSIS — M1990 Unspecified osteoarthritis, unspecified site: Secondary | ICD-10-CM | POA: Diagnosis not present

## 2020-12-12 DIAGNOSIS — E1165 Type 2 diabetes mellitus with hyperglycemia: Secondary | ICD-10-CM | POA: Diagnosis not present

## 2020-12-16 DIAGNOSIS — M76822 Posterior tibial tendinitis, left leg: Secondary | ICD-10-CM | POA: Diagnosis not present

## 2020-12-16 DIAGNOSIS — M25572 Pain in left ankle and joints of left foot: Secondary | ICD-10-CM | POA: Diagnosis not present

## 2020-12-16 DIAGNOSIS — M25571 Pain in right ankle and joints of right foot: Secondary | ICD-10-CM | POA: Diagnosis not present

## 2020-12-16 DIAGNOSIS — M76821 Posterior tibial tendinitis, right leg: Secondary | ICD-10-CM | POA: Diagnosis not present

## 2020-12-19 DIAGNOSIS — H26493 Other secondary cataract, bilateral: Secondary | ICD-10-CM | POA: Diagnosis not present

## 2020-12-19 DIAGNOSIS — H5211 Myopia, right eye: Secondary | ICD-10-CM | POA: Diagnosis not present

## 2020-12-19 DIAGNOSIS — H43813 Vitreous degeneration, bilateral: Secondary | ICD-10-CM | POA: Diagnosis not present

## 2020-12-19 DIAGNOSIS — H11823 Conjunctivochalasis, bilateral: Secondary | ICD-10-CM | POA: Diagnosis not present

## 2021-01-01 DIAGNOSIS — Z23 Encounter for immunization: Secondary | ICD-10-CM | POA: Diagnosis not present

## 2021-01-01 DIAGNOSIS — J4551 Severe persistent asthma with (acute) exacerbation: Secondary | ICD-10-CM | POA: Diagnosis not present

## 2021-01-01 DIAGNOSIS — D7219 Other eosinophilia: Secondary | ICD-10-CM | POA: Diagnosis not present

## 2021-01-05 DIAGNOSIS — M961 Postlaminectomy syndrome, not elsewhere classified: Secondary | ICD-10-CM | POA: Diagnosis not present

## 2021-01-05 DIAGNOSIS — Z79899 Other long term (current) drug therapy: Secondary | ICD-10-CM | POA: Diagnosis not present

## 2021-01-05 DIAGNOSIS — G894 Chronic pain syndrome: Secondary | ICD-10-CM | POA: Diagnosis not present

## 2021-01-05 DIAGNOSIS — Z5181 Encounter for therapeutic drug level monitoring: Secondary | ICD-10-CM | POA: Diagnosis not present

## 2021-01-05 DIAGNOSIS — M48061 Spinal stenosis, lumbar region without neurogenic claudication: Secondary | ICD-10-CM | POA: Diagnosis not present

## 2021-01-06 ENCOUNTER — Emergency Department (HOSPITAL_COMMUNITY): Payer: PPO

## 2021-01-06 ENCOUNTER — Encounter (HOSPITAL_COMMUNITY): Payer: Self-pay | Admitting: *Deleted

## 2021-01-06 ENCOUNTER — Inpatient Hospital Stay (HOSPITAL_COMMUNITY)
Admission: EM | Admit: 2021-01-06 | Discharge: 2021-01-09 | DRG: 202 | Disposition: A | Discharge status: Home / Self Care | Payer: PPO | Attending: Internal Medicine | Admitting: Internal Medicine

## 2021-01-06 DIAGNOSIS — I10 Essential (primary) hypertension: Secondary | ICD-10-CM | POA: Diagnosis present

## 2021-01-06 DIAGNOSIS — N1832 Chronic kidney disease, stage 3b: Secondary | ICD-10-CM | POA: Diagnosis present

## 2021-01-06 DIAGNOSIS — E876 Hypokalemia: Secondary | ICD-10-CM

## 2021-01-06 DIAGNOSIS — Z20822 Contact with and (suspected) exposure to covid-19: Secondary | ICD-10-CM | POA: Diagnosis not present

## 2021-01-06 DIAGNOSIS — K449 Diaphragmatic hernia without obstruction or gangrene: Secondary | ICD-10-CM | POA: Diagnosis not present

## 2021-01-06 DIAGNOSIS — I509 Heart failure, unspecified: Secondary | ICD-10-CM | POA: Diagnosis not present

## 2021-01-06 DIAGNOSIS — M064 Inflammatory polyarthropathy: Secondary | ICD-10-CM | POA: Diagnosis not present

## 2021-01-06 DIAGNOSIS — I130 Hypertensive heart and chronic kidney disease with heart failure and stage 1 through stage 4 chronic kidney disease, or unspecified chronic kidney disease: Secondary | ICD-10-CM | POA: Diagnosis present

## 2021-01-06 DIAGNOSIS — J449 Chronic obstructive pulmonary disease, unspecified: Secondary | ICD-10-CM | POA: Diagnosis present

## 2021-01-06 DIAGNOSIS — K219 Gastro-esophageal reflux disease without esophagitis: Secondary | ICD-10-CM | POA: Diagnosis not present

## 2021-01-06 DIAGNOSIS — E860 Dehydration: Secondary | ICD-10-CM

## 2021-01-06 DIAGNOSIS — Z6835 Body mass index (BMI) 35.0-35.9, adult: Secondary | ICD-10-CM | POA: Diagnosis not present

## 2021-01-06 DIAGNOSIS — Z87891 Personal history of nicotine dependence: Secondary | ICD-10-CM

## 2021-01-06 DIAGNOSIS — E669 Obesity, unspecified: Secondary | ICD-10-CM

## 2021-01-06 DIAGNOSIS — Z79899 Other long term (current) drug therapy: Secondary | ICD-10-CM

## 2021-01-06 DIAGNOSIS — G894 Chronic pain syndrome: Secondary | ICD-10-CM | POA: Diagnosis not present

## 2021-01-06 DIAGNOSIS — F419 Anxiety disorder, unspecified: Secondary | ICD-10-CM | POA: Diagnosis present

## 2021-01-06 DIAGNOSIS — D72829 Elevated white blood cell count, unspecified: Secondary | ICD-10-CM

## 2021-01-06 DIAGNOSIS — I5032 Chronic diastolic (congestive) heart failure: Secondary | ICD-10-CM | POA: Diagnosis not present

## 2021-01-06 DIAGNOSIS — N179 Acute kidney failure, unspecified: Secondary | ICD-10-CM | POA: Diagnosis not present

## 2021-01-06 DIAGNOSIS — M4800 Spinal stenosis, site unspecified: Secondary | ICD-10-CM | POA: Diagnosis present

## 2021-01-06 DIAGNOSIS — Z8249 Family history of ischemic heart disease and other diseases of the circulatory system: Secondary | ICD-10-CM

## 2021-01-06 DIAGNOSIS — Z90710 Acquired absence of both cervix and uterus: Secondary | ICD-10-CM | POA: Diagnosis not present

## 2021-01-06 DIAGNOSIS — J441 Chronic obstructive pulmonary disease with (acute) exacerbation: Secondary | ICD-10-CM | POA: Diagnosis not present

## 2021-01-06 DIAGNOSIS — J4541 Moderate persistent asthma with (acute) exacerbation: Secondary | ICD-10-CM | POA: Diagnosis not present

## 2021-01-06 DIAGNOSIS — E782 Mixed hyperlipidemia: Secondary | ICD-10-CM

## 2021-01-06 DIAGNOSIS — J4521 Mild intermittent asthma with (acute) exacerbation: Secondary | ICD-10-CM

## 2021-01-06 DIAGNOSIS — R0602 Shortness of breath: Secondary | ICD-10-CM | POA: Diagnosis not present

## 2021-01-06 DIAGNOSIS — N183 Chronic kidney disease, stage 3 unspecified: Secondary | ICD-10-CM | POA: Diagnosis present

## 2021-01-06 DIAGNOSIS — J45901 Unspecified asthma with (acute) exacerbation: Secondary | ICD-10-CM | POA: Diagnosis present

## 2021-01-06 LAB — BASIC METABOLIC PANEL
Anion gap: 11 (ref 5–15)
BUN: 20 mg/dL (ref 8–23)
CO2: 31 mmol/L (ref 22–32)
Calcium: 8.5 mg/dL — ABNORMAL LOW (ref 8.9–10.3)
Chloride: 94 mmol/L — ABNORMAL LOW (ref 98–111)
Creatinine, Ser: 1.32 mg/dL — ABNORMAL HIGH (ref 0.44–1.00)
GFR, Estimated: 41 mL/min — ABNORMAL LOW (ref >60)
Glucose, Bld: 102 mg/dL — ABNORMAL HIGH (ref 70–99)
Potassium: 2.8 mmol/L — ABNORMAL LOW (ref 3.5–5.1)
Sodium: 136 mmol/L (ref 135–145)

## 2021-01-06 LAB — CBC WITH DIFFERENTIAL/PLATELET
Abs Immature Granulocytes: 0.06 10*3/uL (ref 0.00–0.07)
Basophils Absolute: 0 10*3/uL (ref 0.0–0.1)
Basophils Relative: 0 %
Eosinophils Absolute: 0 10*3/uL (ref 0.0–0.5)
Eosinophils Relative: 0 %
HCT: 37.7 % (ref 36.0–46.0)
Hemoglobin: 11.9 g/dL — ABNORMAL LOW (ref 12.0–15.0)
Immature Granulocytes: 1 %
Lymphocytes Relative: 24 %
Lymphs Abs: 3.2 10*3/uL (ref 0.7–4.0)
MCH: 25.8 pg — ABNORMAL LOW (ref 26.0–34.0)
MCHC: 31.6 g/dL (ref 30.0–36.0)
MCV: 81.8 fL (ref 80.0–100.0)
Monocytes Absolute: 0.7 10*3/uL (ref 0.1–1.0)
Monocytes Relative: 6 %
Neutro Abs: 9.1 10*3/uL — ABNORMAL HIGH (ref 1.7–7.7)
Neutrophils Relative %: 69 %
Platelets: 267 10*3/uL (ref 150–400)
RBC: 4.61 MIL/uL (ref 3.87–5.11)
RDW: 14.8 % (ref 11.5–15.5)
WBC: 13.1 10*3/uL — ABNORMAL HIGH (ref 4.0–10.5)
nRBC: 0.2 % (ref 0.0–0.2)

## 2021-01-06 LAB — BRAIN NATRIURETIC PEPTIDE: B Natriuretic Peptide: 50 pg/mL (ref 0.0–100.0)

## 2021-01-06 LAB — RESP PANEL BY RT-PCR (FLU A&B, COVID) ARPGX2
Influenza A by PCR: NEGATIVE
Influenza B by PCR: NEGATIVE
SARS Coronavirus 2 by RT PCR: NEGATIVE

## 2021-01-06 LAB — TROPONIN I (HIGH SENSITIVITY)
Troponin I (High Sensitivity): 9 ng/L (ref <18)
Troponin I (High Sensitivity): 10 ng/L (ref <18)

## 2021-01-06 LAB — MAGNESIUM: Magnesium: 3.9 mg/dL — ABNORMAL HIGH (ref 1.7–2.4)

## 2021-01-06 MED ORDER — POTASSIUM CHLORIDE 10 MEQ/100ML IV SOLN
10.0000 meq | Freq: Once | INTRAVENOUS | Status: AC
  Administered 2021-01-06: 10 meq via INTRAVENOUS
  Filled 2021-01-06: qty 100

## 2021-01-06 MED ORDER — METHYLPREDNISOLONE SODIUM SUCC 125 MG IJ SOLR
125.0000 mg | Freq: Once | INTRAMUSCULAR | Status: AC
  Administered 2021-01-06: 125 mg via INTRAVENOUS
  Filled 2021-01-06: qty 2

## 2021-01-06 MED ORDER — SODIUM CHLORIDE 0.9 % IV BOLUS
1000.0000 mL | Freq: Once | INTRAVENOUS | Status: AC
  Administered 2021-01-06: 1000 mL via INTRAVENOUS

## 2021-01-06 MED ORDER — SODIUM CHLORIDE 0.9 % IV SOLN: 500.0000 mg | INTRAVENOUS | Status: DC

## 2021-01-06 MED ORDER — ENOXAPARIN SODIUM 40 MG/0.4ML IJ SOSY
40.0000 mg | PREFILLED_SYRINGE | INTRAMUSCULAR | Status: DC
  Administered 2021-01-07 – 2021-01-09 (×3): 40 mg via SUBCUTANEOUS
  Filled 2021-01-06 (×3): qty 0.4

## 2021-01-06 MED ORDER — IPRATROPIUM BROMIDE 0.02 % IN SOLN
0.5000 mg | Freq: Once | RESPIRATORY_TRACT | Status: AC
  Administered 2021-01-06: 0.5 mg via RESPIRATORY_TRACT
  Filled 2021-01-06: qty 2.5

## 2021-01-06 MED ORDER — POTASSIUM CHLORIDE CRYS ER 20 MEQ PO TBCR
40.0000 meq | EXTENDED_RELEASE_TABLET | Freq: Once | ORAL | Status: AC
  Administered 2021-01-06: 40 meq via ORAL
  Filled 2021-01-06: qty 2

## 2021-01-06 MED ORDER — AZITHROMYCIN 250 MG PO TABS
500.0000 mg | ORAL_TABLET | Freq: Once | ORAL | Status: AC
  Administered 2021-01-06: 500 mg via ORAL
  Filled 2021-01-06: qty 2

## 2021-01-06 MED ORDER — ALPRAZOLAM 0.5 MG PO TABS
0.5000 mg | ORAL_TABLET | Freq: Once | ORAL | Status: AC
  Administered 2021-01-06: 0.5 mg via ORAL
  Filled 2021-01-06: qty 1

## 2021-01-06 MED ORDER — ALBUTEROL (5 MG/ML) CONTINUOUS INHALATION SOLN
10.0000 mg/h | INHALATION_SOLUTION | Freq: Once | RESPIRATORY_TRACT | Status: AC
  Administered 2021-01-06: 10 mg/h via RESPIRATORY_TRACT

## 2021-01-06 MED ORDER — MAGNESIUM SULFATE 2 GM/50ML IV SOLN
2.0000 g | Freq: Once | INTRAVENOUS | Status: AC
  Administered 2021-01-06: 2 g via INTRAVENOUS
  Filled 2021-01-06: qty 50

## 2021-01-06 MED ORDER — ALBUTEROL SULFATE (2.5 MG/3ML) 0.083% IN NEBU
INHALATION_SOLUTION | RESPIRATORY_TRACT | Status: AC
  Filled 2021-01-06: qty 6

## 2021-01-06 NOTE — H&P (Signed)
History and Physical  MATTILYNN FORRER JQB:[id number] DOB: Apr 12, 1940 DOA: 01/06/2021  Referring physician: Marcello Fennel, PA-C PCP: Celene Squibb, MD  Patient coming from: Home  Chief Complaint: Shortness of breath  HPI: Glenda Terry is a 80 y.o. female with medical history significant for asthma, anxiety, stage IIIb CKD, hypertension, hyperlipidemia, GERD, diastolic CHF, chronic pain syndrome, obesity who presents to the emergency department accompanied by daughter due to worsening shortness of breath.  Patient was not able to provide a history due to increased work of breathing, history was obtained from ED PA and daughter at bedside, per daughter, patient has been having increased shortness of breath for about a week and she has been having nasal congestion and increased nonproductive coughing fits which was worse at night.  She used home breathing treatments (inhaler and nebulizer) without any improvement, so she decided to go to the ED for further evaluation and management.  She denies fever, chills, blurry vision, abdominal pain, nausea or vomiting.  ED Course:  In the emergency department, patient was hemodynamically stable, work-up in the ED showed magnesium 3.9, troponin x2 was negative, sodium 136, potassium 2.8, chloride 94, CO2 31, glucose 102, BUN 20, creatinine 1.32, GFR 41.  CBC showed WBC 13.1, hemoglobin 11.9, hematocrit 37.7, MCV 81.8, platelets 267.  Influenza A, B, SARS coronavirus 2 was negative. Chest x-ray showed no acute cardiopulmonary process, but showed stable moderate sized hiatal hernia Breathing treatment was provided, Solu-Medrol was given, potassium was replenished, IV hydration was provided.  Hospitalist was asked to admit patient for further evaluation and management.  Review of Systems: Constitutional: Negative for chills and fever.  HENT: Negative for ear pain and sore throat.   Eyes: Negative for pain and visual disturbance.  Respiratory:  Positive for cough, chest tightness and shortness of breath.   Cardiovascular: Positive for chest pain (related to cough) and negative for palpitations.  Gastrointestinal: Negative for abdominal pain and vomiting.  Endocrine: Negative for polyphagia and polyuria.  Genitourinary: Negative for decreased urine volume, dysuria, enuresis Musculoskeletal: Negative for arthralgias and back pain.  Skin: Negative for color change and rash.  Allergic/Immunologic: Negative for immunocompromised state.  Neurological: Negative for tremors, syncope, weakness, light-headedness and headaches.  Hematological: Does not bruise/bleed easily.  All other systems reviewed and are negative   Past Medical History:  Diagnosis Date   Anxiety    Asthma    Back pain, chronic    CKD (chronic kidney disease) stage 3, GFR 30-59 ml/min (HCC)    Compression fx, thoracic spine (HCC)    T - 11   COPD (chronic obstructive pulmonary disease) ([place]) 12/16/2015   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HCC)    Essential hypertension    GERD (gastroesophageal reflux disease)    HOH (hard of hearing)    Inflammatory arthritis    Iron deficiency anemia 01/2012   Pulmonary eosinophilia (HCC)    Spinal stenosis    Tubular adenoma 11/2012   Past Surgical History:  Procedure Laterality Date   ABDOMINAL HYSTERECTOMY     BACK SURGERY     CARPAL TUNNEL RELEASE Right 06/2012   CATARACT EXTRACTION W/PHACO Left 11/19/2014   Procedure: CATARACT EXTRACTION PHACO AND INTRAOCULAR LENS PLACEMENT ([place]);  Surgeon: Williams Che, MD;  Location: [facility];  Service: Ophthalmology;  Laterality: Left;  CDE: 4.77   CATARACT EXTRACTION W/PHACO Right 02/03/2015   Procedure: CATARACT EXTRACTION PHACO AND INTRAOCULAR LENS PLACEMENT ([facility]);  Surgeon: Williams Che, MD;  Location: [facility];  Service:  Ophthalmology;  Laterality: Right;  CDE: 4.54   COLONOSCOPY  06/19/2008   RMR: tortuous and elongated colon with scattered left-sided diverticula/colonic mucosa appeared  entirely normal. Prior colonic ulcers had resolved.   COLONOSCOPY  12/2007   Dr. Gala Romney: Scattered diffuse sigmoid diverticula, 2 areas of ulceration at the hepatic flexure. Biopsies unremarkable.   COLONOSCOPY N/A 11/20/2012   next TCS 11/2017   COLONOSCOPY WITH PROPOFOL N/A 12/22/2017   Procedure: COLONOSCOPY WITH PROPOFOL;  Surgeon: Daneil Dolin, MD; diverticula in the sigmoid and descending colon and 2 tubular adenomas.  No recommendations to repeat.     DECOMPRESSIVE LUMBAR LAMINECTOMY LEVEL 2  04/20/2012   Procedure: DECOMPRESSIVE LUMBAR LAMINECTOMY LEVEL 2;  Surgeon: Tobi Bastos, MD;  Location: [facility];  Service: Orthopedics;  Laterality: Right;  Decompressive Lumbar Laminectomy of the L4 - L5 and L5 - S1 Complete/Laminectomy L5 on the Right (X-Ray)   ESOPHAGOGASTRODUODENOSCOPY  12/2007   Dr. 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 Small hiatal hernia. Slightly pale duodenal mucosa (biopsy unremarkable)   ESOPHAGOGASTRODUODENOSCOPY (EGD) WITH PROPOFOL N/A 01/20/2017   Dr. Gala Romney: Medium sized hiatal hernia empiric esophageal dilation for history of dysphagia   ESOPHAGOGASTRODUODENOSCOPY (EGD) WITH PROPOFOL N/A 04/08/2020   Surgeon: Eloise Harman, DO;  medium size hiatal hernia 3 to 4 cm chicken bone lodged in the gastric antrum just proximal to the pylorus removed with a snare with mild bleeding at the site of lodged bone.  Normal examined duodenum.   EYE SURGERY     BIL CATARACTS   FOREIGN BODY REMOVAL N/A 04/08/2020   Procedure: FOREIGN BODY REMOVAL;  Surgeon: Eloise Harman, DO;  [facility];  Service: Endoscopy;  Laterality: N/A;   INCISIONAL HERNIA REPAIR N/A 11/12/2020   Procedure: HERNIA REPAIR INCISIONAL;  Surgeon: Aviva Signs, MD;  Location: [facility];  Service: General;  Laterality: N/A;   IR KYPHO THORACIC WITH BONE BIOPSY  02/08/2017   IR RADIOLOGIST EVAL & MGMT  02/02/2017   JOINT REPLACEMENT     KNEE  ARTHROSCOPY Right    MALONEY DILATION N/A 01/20/2017   Procedure: MALONEY DILATION;  Surgeon: Daneil Dolin, MD;  Location: [facility];  Service: Endoscopy;  Laterality: N/A;   POLYPECTOMY  12/22/2017   Procedure: POLYPECTOMY;  Surgeon: Daneil Dolin, MD;  [facility]: [facility];  Service: Endoscopy;;   REVERSE SHOULDER ARTHROPLASTY Right 05/24/2014   Procedure: RIGHT SHOULDER REVERSE ARTHROPLASTY;  Surgeon: Netta Cedars, MD;  Location: [place];  Service: Orthopedics;  Laterality: Right;   REVERSE SHOULDER ARTHROPLASTY Left 06/10/2017   Procedure: LEFT REVERSE SHOULDER ARTHROPLASTY;  Surgeon: Netta Cedars, MD;  Location: [place];  Service: Orthopedics;  Laterality: Left;   TOTAL KNEE ARTHROPLASTY  01/24/2012   Procedure: TOTAL KNEE ARTHROPLASTY;  Surgeon: Gearlean Alf, MD;  Location: [facility];  Service: Orthopedics;  Laterality: Right;   TOTAL KNEE ARTHROPLASTY Left 10/15/2019   Procedure: TOTAL KNEE ARTHROPLASTY;  Surgeon: Gaynelle Arabian, MD;  Location: [facility];  Service: Orthopedics;  Laterality: Left;  61min   TUBAL LIGATION      Social History:  reports that she quit smoking about 28 years ago. Her smoking use included cigarettes. She has a 80.00 pack-year smoking history. She has never used smokeless tobacco. She reports that she does not drink alcohol and does not use drugs.   Allergies  Allergen Reactions   Flexeril [Cyclobenzaprine] Anaphylaxis   Other Anaphylaxis and Other (See Comments)  ALL TREE NUTS   Keflex [Cephalexin] Itching   Aspirin Other (See Comments)    Cannot take due to ulcers   Fentanyl Other (See Comments)    Lip Swelling   Adhesive [Tape] Rash   Chlorhexidine Rash    Family History  Problem Relation Age of Onset   Breast cancer Sister    Colon cancer Sister        two deceased, one living and terminal, one current undergoing treatment, ages 8, 56, 73, 19   Hypertension Mother    Stroke Mother    Heart attack Mother    Hypertension Father     Prostate cancer Father      Prior to Admission medications   Medication Sig Start Date End Date Taking? Authorizing Provider  Acetaminophen (APAP 500 PO) Take 2 tablets by mouth 2 (two) times daily as needed.   Yes [provider]  albuterol (PROAIR HFA) 108 (90 Base) MCG/ACT inhaler Inhale 2 puffs into the lungs every 6 (six) hours as needed for wheezing or shortness of breath (For COPD).    Yes [provider]  ALPRAZolam Duanne Moron) 0.5 MG tablet Take 0.5 tablets (0.25 mg total) by mouth 2 (two) times daily as needed for anxiety. 10/25/19  Yes Gerlene Fee, NP  amLODipine (NORVASC) 10 MG tablet Take 10 mg by mouth daily.   Yes [provider]  bisacodyl (DULCOLAX) 5 MG EC tablet Take 5 mg by mouth daily as needed for moderate constipation.   Yes [provider]  calcium carbonate (OS-CAL) 600 MG TABS tablet Take 600 mg by mouth 2 (two) times daily with a meal.   Yes [provider]  Cholecalciferol (VITAMIN D3) 50 MCG (2000 UT) CAPS Take 1 capsule by mouth daily.   Yes [provider]  denosumab (PROLIA) 60 MG/ML SOSY injection Inject 60 mg into the skin every 6 (six) months. 03/01/18  Yes [provider]  diphenhydrAMINE (BENADRYL) 25 mg capsule Take 25 mg by mouth at bedtime.   Yes [provider]  DULERA 200-5 MCG/ACT AERO Inhale 2 puffs into the lungs 2 (two) times daily. 10/25/19  Yes Gerlene Fee, NP  esomeprazole (NEXIUM) 40 MG capsule Take 1 capsule (40 mg total) by mouth 2 (two) times daily. 10/25/19  Yes Gerlene Fee, NP  ferrous sulfate 325 (65 FE) MG tablet Take 1 tablet (325 mg total) by mouth daily with breakfast. 10/25/19  Yes Gerlene Fee, NP  fexofenadine (ALLEGRA) 180 MG tablet Take 180 mg by mouth daily.   Yes [provider]  fluticasone (FLONASE) 50 MCG/ACT nasal spray Place 1 spray into both nostrils daily as needed for allergies or rhinitis.  10/17/19  Yes [provider]   ipratropium-albuterol (DUONEB) 0.5-2.5 (3) MG/3ML SOLN Take 3 mLs by nebulization every 4 (four) hours as needed.   Yes [provider]  ketotifen (ZADITOR) 0.025 % ophthalmic solution Place 1 drop into both eyes 2 (two) times daily as needed (allergies). 10/25/19  Yes Gerlene Fee, NP  Magnesium 250 MG TABS Take 250 mg by mouth daily.   Yes [provider]  montelukast (SINGULAIR) 10 MG tablet Take 1 tablet (10 mg total) by mouth at bedtime. 07/25/20  Yes Elouise Munroe, MD  Oxycodone HCl 10 MG TABS Take 10 mg by mouth every 6 (six) hours. 12/29/19  Yes [provider]  Polyethyl Glycol-Propyl Glycol (SYSTANE OP) Place 1 drop into both eyes 3 (three) times daily as needed (  dry/irritated eyes.).    Yes [provider]  polyethylene glycol (MIRALAX / GLYCOLAX) 17 g packet Take 17 g by mouth daily. 11/07/20  Yes Tegeler, Gwenyth Allegra, MD  potassium chloride (KLOR-CON) 10 MEQ tablet Take 1 tablet (10 mEq total) by mouth every other day. IN THE MORNING Patient taking differently: Take 20 mEq by mouth daily. IN THE MORNING 10/25/19  Yes Gerlene Fee, NP  pravastatin (PRAVACHOL) 10 MG tablet Take 10 mg by mouth daily. 03/27/20  Yes [provider]  torsemide (DEMADEX) 10 MG tablet Take 20 mg by mouth daily.   Yes [provider]  vitamin C (ASCORBIC ACID) 500 MG tablet Take 500 mg by mouth daily.   Yes [provider]  amLODipine (NORVASC) 5 MG tablet Take 1 tablet (5 mg total) by mouth daily. Patient not taking: No sig reported 06/17/20   Elouise Munroe, MD  Benralizumab 30 MG/ML SOSY Inject 30 mg into the skin. Once A Day Every 56 Days 10/17/19   [provider]  Menthol, Topical Analgesic, (BIOFREEZE) 4 % GEL Apply 1 application topically 2 (two) times daily. Special Instructions: for back pain Patient not taking: No sig reported 10/23/19   [provider]  nystatin (MYCOSTATIN) 100000 UNIT/ML suspension Take 5  mLs (500,000 Units total) by mouth 4 (four) times daily. Patient not taking: No sig reported 11/07/20   Tegeler, Gwenyth Allegra, MD  ondansetron (ZOFRAN ODT) 4 MG disintegrating tablet Take 1 tablet (4 mg total) by mouth every 8 (eight) hours as needed for nausea or vomiting. Patient not taking: No sig reported 11/09/20   Arnaldo Natal, MD    Physical Exam: BP 115/62   Pulse 95   Temp 97.9 F (36.6 C) (Oral)   Resp 14   Ht 5' (1.524 m)   Wt 82.6 kg   SpO2 97%   BMI 35.54 kg/m   General: 80 y.o. year-old female well developed well nourished in no acute distress.  Alert and oriented x3. HEENT: NCAT, EOMI Neck: Supple, trachea medial Cardiovascular: Regular rate and rhythm with no rubs or gallops.  No thyromegaly or JVD noted.  No lower extremity edema. 2/4 pulses in all 4 extremities. Respiratory: Decreased breath sounds with diffuse intermittent wheezes.  Patient could barely speak in full sentences.  Abdomen: Soft, nontender nondistended with normal bowel sounds x4 quadrants. Muskuloskeletal: No cyanosis, clubbing or edema noted bilaterally Neuro: CN II-XII intact, strength 5/5 x 4, sensation, reflexes intact Skin: No ulcerative lesions noted or rashes Psychiatry: Judgement and insight appear normal. Mood is appropriate for condition and setting          Labs on Admission:  Basic Metabolic Panel: Recent Labs  Lab 01/06/21 1741 01/06/21 1911  NA 136  --   K 2.8*  --   CL 94*  --   CO2 31  --   GLUCOSE 102*  --   BUN 20  --   CREATININE 1.32*  --   CALCIUM 8.5*  --   MG  --  3.9*   Liver Function Tests: No results for input(s): AST, ALT, ALKPHOS, BILITOT, PROT, ALBUMIN in the last 168 hours. No results for input(s): LIPASE, AMYLASE in the last 168 hours. No results for input(s): AMMONIA in the last 168 hours. CBC: Recent Labs  Lab 01/06/21 1741  WBC 13.1*  NEUTROABS 9.1*  HGB 11.9*  HCT 37.7  MCV 81.8  PLT 267   Cardiac Enzymes: No results for input(s):  CKTOTAL, CKMB,  CKMBINDEX, TROPONINI in the last 168 hours.  BNP (last 3 results) Recent Labs    01/06/21 1741  BNP 50.0    ProBNP (last 3 results) No results for input(s): PROBNP in the last 8760 hours.  CBG: No results for input(s): GLUCAP in the last 168 hours.  Radiological Exams on Admission: DG Chest 2 View  Result Date: 01/06/2021 CLINICAL DATA:  CHF, shortness of breath. EXAM: CHEST - 2 VIEW COMPARISON:  Chest x-ray 11/11/2020. FINDINGS: Moderate size hiatal hernia is again seen. The cardiomediastinal silhouette is within normal limits. There is no focal lung consolidation, pleural effusion, or pneumothorax. 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 No acute fractures. IMPRESSION: 1. No acute cardiopulmonary process. 2. Stable moderate-sized hiatal hernia. Electronically Signed   By: Ronney Asters M.D.   On: 01/06/2021 17:50    EKG: I independently viewed the EKG done and my findings are as followed: Normal sinus rhythm at a rate of 89 bpm  Assessment/Plan Present on Admission:  Asthma exacerbation  CKD (chronic kidney disease) stage 3, GFR 30-59 ml/min (HCC)  GERD (gastroesophageal reflux disease)  HTN (hypertension)  COPD (chronic obstructive pulmonary disease) (HCC)  Principal Problem:   Asthma exacerbation Active Problems:   HTN (hypertension)   GERD (gastroesophageal reflux disease)   CKD (chronic kidney disease) stage 3, GFR 30-59 ml/min (HCC)   COPD (chronic obstructive pulmonary disease) (HCC)   Hypokalemia   Hypermagnesemia   Dehydration   Hiatal hernia   Leukocytosis   Mixed hyperlipidemia   Chronic diastolic CHF (congestive heart failure) (HCC)   Chronic pain syndrome   Obesity (BMI 35.0-39.9 without comorbidity)  Asthma/COPD exacerbation Continue Atrovent, Mucinex, Solu-Medrol, azithromycin. Continue Dulera, Singulair Continue Protonix to prevent steroid-induced ulcer Continue incentive spirometry and flutter valve Continue supplemental  oxygen to maintain O2 sat > 92% with plan to wean patient off oxygen as tolerated  Hypermagnesemia Mg 3.5, patient is currently asymptomatic Continue telemetry and repeat magnesium level in the morning  Hypokalemia K+ is 2.8 K+ will be replenished Please monitor for AM K+ for further replenishmemnt  Dehydration Continue IV hydration  Leukocytosis possibly reactive WBC 13.1, patient did receive some steroid in the ED Continue to monitor WBC with morning labs  GERD/hiatal hernia Continue Protonix  CKD stage IIIB BUN 20, creatinine 1.32 (baseline creatinine at 1.0-1.4), GFR 41 Renally adjust medications, avoid nephrotoxic agents/dehydration/hypotension  Essential hypertension (controlled) Continue home Norvasc  Mixed hyperlipidemia Continue Pravachol  Chronic diastolic CHF Echocardiogram done on 08/01/2020 showed LVEF of 60 to 65%.  LV diastolic parameters showed grade 1 DD Torsemide temporarily held due to dehydration  Chronic pain syndrome Continue oxycodone per home regimen  Anxiety Continue Xanax per home regimen  Obesity (BMI 35.54) Patient will be counseled on diet and lifestyle modification when more stable.  Other Home meds Flonase  DVT prophylaxis: Lovenox  Code Status: Full code  Family Communication: Daughter at bedside (all questions answered to satisfaction)  Disposition Plan:  Patient is from:                        home Anticipated DC to:                   SNF or family members home Anticipated DC date:               2-3 days Anticipated DC barriers:         Patient requires inpatient management due to asthma/COPD exacerbation which require inpatient  treatment    Consults called: None  Admission status: Observation    Bernadette Hoit MD [facility]  01/07/2021, 12:14 AM

## 2021-01-06 NOTE — ED Triage Notes (Signed)
Shortness of breath for 1 week

## 2021-01-06 NOTE — ED Provider Notes (Signed)
[facility] EMERGENCY DEPARTMENT Provider Note   CSN: [id number] Arrival date & time: 01/06/21  1609     History Chief Complaint  Patient presents with   Shortness of Breath    Glenda Terry is a 80 y.o. female.  HPI  Patient with significant medical history of anxiety, asthma, CKD, stage III, COPD, Cushing's disease, presents to the emergency department with chief complaint of asthma exacerbation.  She states this started last Sunday, she states that she has been having increasing coughing fits, it is worse at nighttime, states that she feels a tightness in her chest when the coughing fits come on, and feels like she is having hard time breathing, she states that she is having some chest pain but this only when she is coughing.  She states the cough is nonproductive.  She has had some nasal congestion, but denies fevers, chills, abdominal pain, nausea, vomit, diarrhea, general body aches.  She denies recent sick contacts, is vaccines COVID-19 as well as influenza.  She has used her at home inhalers as well as her nebulizers without much relief.  She denies  alleviating or aggravating factors at this time.  She has no significant cardiac history, no history of PEs or DVTs, currently not on hormone therapy, she denies any dyspnea on exertion, orthopnea or pedal edema.  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[place])    Essential hypertension    GERD (gastroesophageal reflux disease)    HOH (hard of hearing)    Inflammatory arthritis    Iron deficiency anemia 01/2012   Pulmonary eosinophilia ([place])    Spinal stenosis    Tubular adenoma 11/2012    Patient Active Problem List   Diagnosis Date Noted   Abdominal pain 11/11/2020   Incisional  hernia, without obstruction or gangrene    Nausea with vomiting 01/14/2020   Chronic bilateral low back pain 10/25/2019   Chronic constipation 10/22/2019   Bilateral lower extremity edema 10/22/2019   Chronic anemia 10/22/2019   Post-menopausal osteoporosis 10/22/2019   Tachycardia 10/18/2019   Primary osteoarthritis of knee 10/16/2019   Primary osteoarthritis of left knee 10/15/2019   Left lower quadrant abdominal pain 02/16/2019   Hx of adenomatous colonic polyps 11/28/2017   S/P shoulder replacement, 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   Eosinophil count raised 06/09/2016   Severe persistent asthma 06/07/2016   COPD (chronic obstructive pulmonary disease) ([place]) 12/16/2015   Asthma exacerbation 09/20/2015   Acute respiratory failure ([place]) 09/20/2015   CKD (chronic kidney disease) stage 3, GFR 30-59 ml/min ([place]) 09/20/2015   Hemorrhoids, internal 08/21/2015   S/P shoulder replacement 05/24/2014   Microcytic anemia 11/01/2012   Family hx of colon cancer 11/01/2012   Spinal stenosis of lumbar region without neurogenic claudication 04/20/2012   Herniated lumbar intervertebral disc 04/20/2012   Difficulty in walking(719.7) 03/13/2012   Stiffness of joint, not elsewhere classified, lower leg 03/13/2012   Weakness of right leg 03/13/2012   OA (osteoarthritis) of knee 01/24/2012   Anxiety 02/09/2011   HTN (hypertension) 02/09/2011   Arthritis 02/09/2011   GERD (gastroesophageal reflux disease) 02/09/2011   Rheumatoid arthritis ([place]) 02/09/2011    Past Surgical History:  Procedure Laterality Date   ABDOMINAL HYSTERECTOMY  BACK SURGERY     CARPAL TUNNEL RELEASE Right 06/2012   CATARACT EXTRACTION W/PHACO Left 11/19/2014   Procedure: CATARACT EXTRACTION PHACO AND INTRAOCULAR LENS PLACEMENT ([place]);  Surgeon: Williams Che, MD;  Location: [facility];  Service: Ophthalmology;  Laterality: Left;  CDE: 4.77   CATARACT EXTRACTION W/PHACO Right 02/03/2015   Procedure: CATARACT EXTRACTION PHACO AND  INTRAOCULAR LENS PLACEMENT (IOC);  Surgeon: Williams Che, MD;  Location: [facility];  Service: Ophthalmology;  Laterality: Right;  CDE: 4.54   COLONOSCOPY  06/19/2008   RMR: tortuous and elongated colon with scattered left-sided diverticula/colonic mucosa appeared entirely normal. Prior colonic ulcers had resolved.   COLONOSCOPY  12/2007   Dr. Gala Romney: Scattered diffuse sigmoid diverticula, 2 areas of ulceration at the hepatic flexure. Biopsies unremarkable.   COLONOSCOPY N/A 11/20/2012   next TCS 11/2017   COLONOSCOPY WITH PROPOFOL N/A 12/22/2017   Procedure: COLONOSCOPY WITH PROPOFOL;  Surgeon: Daneil Dolin, MD; diverticula in the sigmoid and descending colon and 2 tubular adenomas.  No recommendations to repeat.     DECOMPRESSIVE LUMBAR LAMINECTOMY LEVEL 2  04/20/2012   Procedure: DECOMPRESSIVE LUMBAR LAMINECTOMY LEVEL 2;  Surgeon: Tobi Bastos, MD;  Location: [facility];  Service: Orthopedics;  Laterality: Right;  Decompressive Lumbar Laminectomy of the L4 - L5 and L5 - S1 Complete/Laminectomy L5 on the Right (X-Ray)   ESOPHAGOGASTRODUODENOSCOPY  12/2007   Dr. 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 Small hiatal hernia. Slightly pale duodenal mucosa (biopsy unremarkable)   ESOPHAGOGASTRODUODENOSCOPY (EGD) WITH PROPOFOL N/A 01/20/2017   Dr. Gala Romney: Medium sized hiatal hernia empiric esophageal dilation for history of dysphagia   ESOPHAGOGASTRODUODENOSCOPY (EGD) WITH PROPOFOL N/A 04/08/2020   Surgeon: Eloise Harman, DO;  medium size hiatal hernia 3 to 4 cm chicken bone lodged in the gastric antrum just proximal to the pylorus removed with a snare with mild bleeding at the site of lodged bone.  Normal examined duodenum.   EYE SURGERY     BIL CATARACTS   FOREIGN BODY REMOVAL N/A 04/08/2020   Procedure: FOREIGN BODY REMOVAL;  Surgeon: Eloise Harman, DO;  [facility];  Service: Endoscopy;  Laterality: N/A;   INCISIONAL HERNIA  REPAIR N/A 11/12/2020   Procedure: HERNIA REPAIR INCISIONAL;  Surgeon: Aviva Signs, MD;  Location: [facility];  Service: General;  Laterality: N/A;   IR KYPHO THORACIC WITH BONE BIOPSY  02/08/2017   IR RADIOLOGIST EVAL & MGMT  02/02/2017   JOINT REPLACEMENT     KNEE ARTHROSCOPY Right    MALONEY DILATION N/A 01/20/2017   Procedure: MALONEY DILATION;  Surgeon: Daneil Dolin, MD;  [facility]: [facility];  Service: Endoscopy;  Laterality: N/A;   POLYPECTOMY  12/22/2017   Procedure: POLYPECTOMY;  Surgeon: Daneil Dolin, MD;  [facility];  Service: Endoscopy;;   REVERSE SHOULDER ARTHROPLASTY Right 05/24/2014   Procedure: RIGHT SHOULDER REVERSE ARTHROPLASTY;  Surgeon: Netta Cedars, MD;  Location: [place];  Service: Orthopedics;  Laterality: Right;   REVERSE SHOULDER ARTHROPLASTY Left 06/10/2017   Procedure: LEFT REVERSE SHOULDER ARTHROPLASTY;  Surgeon: Netta Cedars, MD;  Location: [place];  Service: Orthopedics;  Laterality: Left;   TOTAL KNEE ARTHROPLASTY  01/24/2012   Procedure: TOTAL KNEE ARTHROPLASTY;  Surgeon: Gearlean Alf, MD;  Location: [facility];  Service: Orthopedics;  Laterality: Right;   TOTAL KNEE ARTHROPLASTY Left 10/15/2019   Procedure: TOTAL KNEE ARTHROPLASTY;  Surgeon: Gaynelle Arabian, MD;  Location: [facility];  Service: Orthopedics;  Laterality: Left;  56min   TUBAL LIGATION       OB History     Gravida  3   Para  3   Term  2   Preterm  1   AB      Living  3      SAB      IAB      Ectopic      Multiple      Live Births              Family History  Problem Relation Age of Onset   Breast cancer Sister    Colon cancer Sister        two deceased, one living and terminal, one current undergoing treatment, ages 88, 2, 21, 16   Hypertension Mother    Stroke Mother    Heart attack Mother    Hypertension Father    Prostate cancer Father     Social History   Tobacco Use   Smoking status: Former    Packs/day: 2.00    Years: 40.00    Pack  years: 80.00    Types: Cigarettes    Quit date: 01/19/1992    Years since quitting: 28.9   Smokeless tobacco: Never  Vaping Use   Vaping Use: Never used  Substance Use Topics   Alcohol use: No   Drug use: No    Home Medications Prior to Admission medications   Medication Sig Start Date End Date Taking? Authorizing Provider  Acetaminophen (APAP 500 PO) Take 2 tablets by mouth 2 (two) times daily as needed.    [provider]  albuterol (PROAIR HFA) 108 (90 Base) MCG/ACT inhaler Inhale 2 puffs into the lungs every 6 (six) hours as needed for wheezing or shortness of breath (For COPD).     [provider]  ALPRAZolam Duanne Moron) 0.5 MG tablet Take 0.5 tablets (0.25 mg total) by mouth 2 (two) times daily as needed for anxiety. 10/25/19   Gerlene Fee, NP  amLODipine (NORVASC) 5 MG tablet Take 1 tablet (5 mg total) by mouth daily. 06/17/20   Elouise Munroe, MD  Benralizumab 30 MG/ML SOSY Inject 30 mg into the skin. Once A Day Every 56 Days 10/17/19   [provider]  bisacodyl (DULCOLAX) 5 MG EC tablet Take 5 mg by mouth daily as needed for moderate constipation.    [provider]  calcium carbonate (OS-CAL) 600 MG TABS tablet Take 600 mg by mouth 2 (two) times daily with a meal.    [provider]  Cholecalciferol (VITAMIN D3) 50 MCG (2000 UT) CAPS Take 1 capsule by mouth daily.    [provider]  denosumab (PROLIA) 60 MG/ML SOSY injection Inject 60 mg into the skin every 6 (six) months. 03/01/18   [provider]  diphenhydrAMINE (BENADRYL) 25 mg capsule Take 25 mg by mouth at bedtime.    [provider]  DULERA 200-5 MCG/ACT AERO Inhale 2 puffs into the lungs 2 (two) times daily. 10/25/19   Gerlene Fee, NP  esomeprazole (NEXIUM) 40 MG capsule Take 1 capsule (40 mg total) by mouth 2 (two) times daily. 10/25/19   Gerlene Fee, NP  ferrous sulfate 325 (65 FE) MG tablet Take 1 tablet (325 mg total) by mouth daily with  breakfast. 10/25/19   Gerlene Fee, NP  fexofenadine (ALLEGRA) 180 MG tablet Take 180 mg by mouth daily.    [provider]  fluticasone (FLONASE) 50 MCG/ACT  nasal spray Place 1 spray into both nostrils daily as needed for allergies or rhinitis.  10/17/19   [provider]  ipratropium-albuterol (DUONEB) 0.5-2.5 (3) MG/3ML SOLN Take 3 mLs by nebulization every 4 (four) hours as needed.    [provider]  ketotifen (ZADITOR) 0.025 % ophthalmic solution Place 1 drop into both eyes 2 (two) times daily as needed (allergies). 10/25/19   Gerlene Fee, NP  Magnesium 250 MG TABS Take 250 mg by mouth daily.    [provider]  Menthol, Topical Analgesic, (BIOFREEZE) 4 % GEL Apply 1 application topically 2 (two) times daily. Special Instructions: for back pain 10/23/19   [provider]  montelukast (SINGULAIR) 10 MG tablet Take 1 tablet (10 mg total) by mouth at bedtime. 07/25/20   Elouise Munroe, MD  nystatin (MYCOSTATIN) 100000 UNIT/ML suspension Take 5 mLs (500,000 Units total) by mouth 4 (four) times daily. 11/07/20   Tegeler, Gwenyth Allegra, MD  ondansetron (ZOFRAN ODT) 4 MG disintegrating tablet Take 1 tablet (4 mg total) by mouth every 8 (eight) hours as needed for nausea or vomiting. 11/09/20   Arnaldo Natal, MD  Oxycodone HCl 10 MG TABS Take 10 mg by mouth every 6 (six) hours. 12/29/19   [provider]  Polyethyl Glycol-Propyl Glycol (SYSTANE OP) Place 1 drop into both eyes 3 (three) times daily as needed (dry/irritated eyes.).     [provider]  polyethylene glycol (MIRALAX / GLYCOLAX) 17 g packet Take 17 g by mouth daily. 11/07/20   Tegeler, Gwenyth Allegra, MD  potassium chloride (KLOR-CON) 10 MEQ tablet Take 1 tablet (10 mEq total) by mouth every other day. IN THE MORNING 10/25/19   Gerlene Fee, NP  pravastatin (PRAVACHOL) 10 MG tablet Take 10 mg by mouth daily. 03/27/20   [provider]  torsemide (DEMADEX) 10 MG  tablet Take 20 mg by mouth daily.    [provider]  vitamin C (ASCORBIC ACID) 500 MG tablet Take 500 mg by mouth daily.    [provider]    Allergies    Flexeril [cyclobenzaprine], Other, Keflex [cephalexin], Aspirin, Fentanyl, Adhesive [tape], and Chlorhexidine  Review of Systems   Review of Systems  Constitutional:  Negative for chills and fever.  HENT:  Positive for congestion.   Respiratory:  Positive for cough and shortness of breath.   Cardiovascular:  Positive for chest pain.  Gastrointestinal:  Negative for abdominal pain, diarrhea and vomiting.  Genitourinary:  Negative for enuresis.  Musculoskeletal:  Negative for back pain.  Skin:  Negative for rash.  Neurological:  Negative for dizziness.  Hematological:  Does not bruise/bleed easily.   Physical Exam Updated Vital Signs BP 131/85   Pulse 98   Temp 97.9 F (36.6 C) (Oral)   Resp 20   SpO2 98%   Physical Exam Vitals and nursing note reviewed.  Constitutional:      General: She is not in acute distress.    Appearance: She is not ill-appearing.  HENT:     Head: Normocephalic and atraumatic.     Nose: No congestion.     Mouth/Throat:     Mouth: Mucous membranes are moist.     Pharynx: Oropharynx is clear.  Eyes:     Conjunctiva/sclera: Conjunctivae normal.  Cardiovascular:     Rate and Rhythm: Normal rate and regular rhythm.     Pulses: Normal pulses.     Heart sounds: No murmur heard.   No friction rub. No gallop.  Pulmonary:     Effort: No respiratory distress.     Breath sounds: Wheezing present. No rhonchi or rales.     Comments: Appear to be in acute respiratory distress, she is tachypneic, not hypoxic, on room air, unable to speak in full sentences, she had a tight sounding chest with intermittent wheezing, no rales or rhonchi present. Abdominal:     Palpations: Abdomen is soft.     Tenderness: There is no abdominal tenderness. There is no right CVA tenderness or left CVA  tenderness.  Musculoskeletal:     Right lower leg: No edema.     Left lower leg: No edema.  Skin:    General: Skin is warm and dry.  Neurological:     Mental Status: She is alert.  Psychiatric:        Mood and Affect: Mood normal.    ED Results / Procedures / Treatments   Labs (all labs ordered are listed, but only abnormal results are displayed) Labs Reviewed  CBC WITH DIFFERENTIAL/PLATELET - Abnormal; Notable for the following components:      Result Value   WBC 13.1 (*)    Hemoglobin 11.9 (*)    MCH 25.8 (*)    Neutro Abs 9.1 (*)    All other components within normal limits  BASIC METABOLIC PANEL - Abnormal; Notable for the following components:   Potassium 2.8 (*)    Chloride 94 (*)    Glucose, Bld 102 (*)    Creatinine, Ser 1.32 (*)    Calcium 8.5 (*)    GFR, Estimated 41 (*)    All other components within normal limits  MAGNESIUM - Abnormal; Notable for the following components:   Magnesium 3.9 (*)    All other components within normal limits  RESP PANEL BY RT-PCR (FLU A&B, COVID) ARPGX2  BRAIN NATRIURETIC PEPTIDE  TROPONIN I (HIGH SENSITIVITY)  TROPONIN I (HIGH SENSITIVITY)    EKG None  Radiology DG Chest 2 View  Result Date: 01/06/2021 CLINICAL DATA:  CHF, shortness of breath. EXAM: CHEST - 2 VIEW COMPARISON:  Chest x-ray 11/11/2020. FINDINGS: Moderate size hiatal hernia is again seen. The cardiomediastinal silhouette is within normal limits. There is no focal lung consolidation, pleural effusion, or pneumothorax. 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 No acute fractures. IMPRESSION: 1. No acute cardiopulmonary process. 2. Stable moderate-sized hiatal hernia. Electronically Signed   By: Ronney Asters M.D.   On: 01/06/2021 17:50    Procedures .Critical Care Performed by: Marcello Fennel, PA-C Authorized by: Marcello Fennel, PA-C   Critical care provider statement:    Critical care time (minutes):  30   Critical care time was exclusive of:   Separately billable procedures and treating other patients   Critical care was necessary to treat or prevent imminent or life-threatening deterioration of the following conditions:  Respiratory failure (Severe asthma exacerbation requiring multiple treatment options.)   Critical care was time spent personally by me on the following activities:  Ordering and performing treatments and interventions, ordering and review of laboratory studies, ordering and review of radiographic studies, pulse oximetry, re-evaluation of patient's condition and evaluation of patient's response to treatment   I assumed direction of critical care for this patient from another provider in my specialty: no     Care discussed with: admitting provider     Medications Ordered in ED Medications  albuterol (PROVENTIL) (2.5 MG/3ML) 0.083% nebulizer solution (  Canceled Entry 01/06/21 1733)  potassium chloride 10 mEq in 100 mL IVPB (  10 mEq Intravenous New Bag/Given 01/06/21 1925)  methylPREDNISolone sodium succinate (SOLU-MEDROL) 125 mg/2 mL injection 125 mg (125 mg Intravenous Given 01/06/21 1800)  magnesium sulfate IVPB 2 g 50 mL (0 g Intravenous Stopped 01/06/21 1916)  albuterol (PROVENTIL,VENTOLIN) solution continuous neb (10 mg/hr Nebulization Given 01/06/21 1732)  ipratropium (ATROVENT) nebulizer solution 0.5 mg (0.5 mg Nebulization Given 01/06/21 1732)  potassium chloride SA (KLOR-CON) CR tablet 40 mEq (40 mEq Oral Given 01/06/21 1928)  sodium chloride 0.9 % bolus 1,000 mL (1,000 mLs Intravenous New Bag/Given 01/06/21 1925)    ED Course  I have reviewed the triage vital signs and the nursing notes.  Pertinent labs & imaging results that were available during my care of the patient were reviewed by me and considered in my medical decision making (see chart for details).    MDM Rules/Calculators/A&P                          Initial impression-patient presents with asthma exacerbation.  She is alert, appears to be in  acute distress.  Likely asthma exacerbation possible underlying viral infection.  Will obtain basic lab work-up, start her on a breathing treatment and reassess.  Work-up-CBC shows leukocytosis of 13.1, normocytic anemia hemoglobin 1.9, BMP shows hypokalemia of 2.8, chloride 94, glucose 102, 1.32, calcium 8.5, BNP 50, respiratory panel negative, negative delta troponin, magnesium 3.9.  Chest x-ray unremarkable.  EKG sinus without signs of ischemia.  Reassessment-patient was given a nebulizing treatment as well as steroids, she was reassessed her lung sounds have increased slightly but she still sounds  tight on my exam, able to speak in full sentences, slightly tachypneic, satting in the upper 90s on room air.  Patient becomes slightly tachycardic after the albuterol treatments and anxious will discontinue further albuterol treatments at this time.    Patient was provided magnesium, she was reassessed, lung sounds have improved further but she still slightly tired my exam., has intermittent wheezing Will recommend admission for likely asthma exacerbation.  Noted the patient has a low potassium level we will add on magnesium, give her p.o. potassium as well as IV potassium.  We will also start her on azithromycin as it is noted that she has a productive cough during my exam with a slightly elevated white count we will treat her prophylactic for possible infection  Consult spoke with Dr. Josephine Cables who will admit the patient.  Rule out-low suspicion for systemic infection as patient is nontoxic-appearing, vital signs are reassuring, she does have slight leukocytosis of 13.1 possibly acute phase versus recent steroid use versus early pneumonia.  I have low suspicion for ACS as patient is atypical, EKG without signs of ischemia, negative delta troponins.  Low suspicion for PE as she denies any pleuritic chest pain, no peripheral edema present, she has low risk factors, presentation more consistent with asthma  exacerbation.  CTA of chest will be deferred at this time but if she does not improve would recommend PE study for further eval.  I have low suspicion for pneumonia as did not hear any rales or rhonchi on my exam, x-ray is negative for consolidations.  low suspicion for CHF exacerbation there is no peripheral edema or rales present my exam, she had a normal BNP.  Noted the patient has an elevated magnesium, this is most likely from the IV mag that was given to her earlier today, fluids have been given likely this problem will resolve on its own.  Plan-admission  for asthma exacerbation.    Final Clinical Impression(s) / ED Diagnoses Final diagnoses:  Moderate persistent asthma with exacerbation    Rx / DC Orders ED Discharge Orders     None        Aron Baba 01/06/21 2155    Milton Ferguson, MD 01/10/21 1301

## 2021-01-07 ENCOUNTER — Other Ambulatory Visit: Payer: Self-pay

## 2021-01-07 ENCOUNTER — Encounter (HOSPITAL_COMMUNITY): Payer: Self-pay | Admitting: Internal Medicine

## 2021-01-07 DIAGNOSIS — Z90710 Acquired absence of both cervix and uterus: Secondary | ICD-10-CM | POA: Diagnosis not present

## 2021-01-07 DIAGNOSIS — E782 Mixed hyperlipidemia: Secondary | ICD-10-CM

## 2021-01-07 DIAGNOSIS — Z8249 Family history of ischemic heart disease and other diseases of the circulatory system: Secondary | ICD-10-CM | POA: Diagnosis not present

## 2021-01-07 DIAGNOSIS — J4521 Mild intermittent asthma with (acute) exacerbation: Secondary | ICD-10-CM | POA: Diagnosis not present

## 2021-01-07 DIAGNOSIS — E876 Hypokalemia: Secondary | ICD-10-CM | POA: Diagnosis present

## 2021-01-07 DIAGNOSIS — Z87891 Personal history of nicotine dependence: Secondary | ICD-10-CM | POA: Diagnosis not present

## 2021-01-07 DIAGNOSIS — K449 Diaphragmatic hernia without obstruction or gangrene: Secondary | ICD-10-CM | POA: Diagnosis present

## 2021-01-07 DIAGNOSIS — Z79899 Other long term (current) drug therapy: Secondary | ICD-10-CM | POA: Diagnosis not present

## 2021-01-07 DIAGNOSIS — G894 Chronic pain syndrome: Secondary | ICD-10-CM

## 2021-01-07 DIAGNOSIS — J441 Chronic obstructive pulmonary disease with (acute) exacerbation: Secondary | ICD-10-CM | POA: Diagnosis present

## 2021-01-07 DIAGNOSIS — K219 Gastro-esophageal reflux disease without esophagitis: Secondary | ICD-10-CM | POA: Diagnosis present

## 2021-01-07 DIAGNOSIS — E860 Dehydration: Secondary | ICD-10-CM | POA: Diagnosis present

## 2021-01-07 DIAGNOSIS — I130 Hypertensive heart and chronic kidney disease with heart failure and stage 1 through stage 4 chronic kidney disease, or unspecified chronic kidney disease: Secondary | ICD-10-CM | POA: Diagnosis present

## 2021-01-07 DIAGNOSIS — E669 Obesity, unspecified: Secondary | ICD-10-CM | POA: Diagnosis present

## 2021-01-07 DIAGNOSIS — F419 Anxiety disorder, unspecified: Secondary | ICD-10-CM | POA: Diagnosis present

## 2021-01-07 DIAGNOSIS — N179 Acute kidney failure, unspecified: Secondary | ICD-10-CM | POA: Diagnosis present

## 2021-01-07 DIAGNOSIS — M064 Inflammatory polyarthropathy: Secondary | ICD-10-CM | POA: Diagnosis present

## 2021-01-07 DIAGNOSIS — J4541 Moderate persistent asthma with (acute) exacerbation: Secondary | ICD-10-CM | POA: Diagnosis present

## 2021-01-07 DIAGNOSIS — Z6835 Body mass index (BMI) 35.0-35.9, adult: Secondary | ICD-10-CM | POA: Diagnosis not present

## 2021-01-07 DIAGNOSIS — N1832 Chronic kidney disease, stage 3b: Secondary | ICD-10-CM | POA: Diagnosis present

## 2021-01-07 DIAGNOSIS — I5032 Chronic diastolic (congestive) heart failure: Secondary | ICD-10-CM

## 2021-01-07 DIAGNOSIS — D72829 Elevated white blood cell count, unspecified: Secondary | ICD-10-CM

## 2021-01-07 DIAGNOSIS — M4800 Spinal stenosis, site unspecified: Secondary | ICD-10-CM | POA: Diagnosis present

## 2021-01-07 DIAGNOSIS — Z20822 Contact with and (suspected) exposure to covid-19: Secondary | ICD-10-CM | POA: Diagnosis present

## 2021-01-07 LAB — RESPIRATORY PANEL BY PCR

## 2021-01-07 LAB — MAGNESIUM: Magnesium: 2.7 mg/dL — ABNORMAL HIGH (ref 1.7–2.4)

## 2021-01-07 LAB — CBC
HCT: 35.3 % — ABNORMAL LOW (ref 36.0–46.0)
Hemoglobin: 10.9 g/dL — ABNORMAL LOW (ref 12.0–15.0)
MCH: 25.1 pg — ABNORMAL LOW (ref 26.0–34.0)
MCHC: 30.9 g/dL (ref 30.0–36.0)
MCV: 81.3 fL (ref 80.0–100.0)
Platelets: 243 10*3/uL (ref 150–400)
RBC: 4.34 MIL/uL (ref 3.87–5.11)
RDW: 14.8 % (ref 11.5–15.5)
WBC: 10.1 10*3/uL (ref 4.0–10.5)
nRBC: 0 % (ref 0.0–0.2)

## 2021-01-07 LAB — COMPREHENSIVE METABOLIC PANEL
ALT: 21 U/L (ref 0–44)
AST: 26 U/L (ref 15–41)
Albumin: 3.4 g/dL — ABNORMAL LOW (ref 3.5–5.0)
Alkaline Phosphatase: 50 U/L (ref 38–126)
Anion gap: 12 (ref 5–15)
BUN: 17 mg/dL (ref 8–23)
CO2: 25 mmol/L (ref 22–32)
Calcium: 8.2 mg/dL — ABNORMAL LOW (ref 8.9–10.3)
Chloride: 98 mmol/L (ref 98–111)
Creatinine, Ser: 1.16 mg/dL — ABNORMAL HIGH (ref 0.44–1.00)
GFR, Estimated: 48 mL/min — ABNORMAL LOW (ref >60)
Glucose, Bld: 157 mg/dL — ABNORMAL HIGH (ref 70–99)
Potassium: 3.9 mmol/L (ref 3.5–5.1)
Sodium: 135 mmol/L (ref 135–145)
Total Bilirubin: 0.7 mg/dL (ref 0.3–1.2)
Total Protein: 6.5 g/dL (ref 6.5–8.1)

## 2021-01-07 LAB — APTT: aPTT: 26 seconds (ref 24–36)

## 2021-01-07 LAB — PHOSPHORUS: Phosphorus: 1.9 mg/dL — ABNORMAL LOW (ref 2.5–4.6)

## 2021-01-07 LAB — PROTIME-INR
INR: 1 (ref 0.8–1.2)
Prothrombin Time: 12.8 seconds (ref 11.4–15.2)

## 2021-01-07 MED ORDER — BENZONATATE 100 MG PO CAPS
100.0000 mg | ORAL_CAPSULE | Freq: Three times a day (TID) | ORAL | Status: DC | PRN
  Administered 2021-01-07: 100 mg via ORAL
  Filled 2021-01-07: qty 1

## 2021-01-07 MED ORDER — POTASSIUM CHLORIDE CRYS ER 10 MEQ PO TBCR
20.0000 meq | EXTENDED_RELEASE_TABLET | Freq: Every day | ORAL | Status: DC
  Administered 2021-01-07 – 2021-01-09 (×3): 20 meq via ORAL
  Filled 2021-01-07 (×4): qty 2

## 2021-01-07 MED ORDER — FLUTICASONE PROPIONATE 50 MCG/ACT NA SUSP
1.0000 | Freq: Every day | NASAL | Status: DC
  Administered 2021-01-07 – 2021-01-09 (×3): 1 via NASAL
  Filled 2021-01-07: qty 16

## 2021-01-07 MED ORDER — SODIUM CHLORIDE 0.9 % IV SOLN: Freq: Once | INTRAVENOUS | Status: AC

## 2021-01-07 MED ORDER — AMLODIPINE BESYLATE 5 MG PO TABS
10.0000 mg | ORAL_TABLET | Freq: Every day | ORAL | Status: DC
  Administered 2021-01-07 – 2021-01-09 (×3): 10 mg via ORAL
  Filled 2021-01-07 (×4): qty 2

## 2021-01-07 MED ORDER — LEVALBUTEROL HCL 1.25 MG/0.5ML IN NEBU
1.2500 mg | INHALATION_SOLUTION | Freq: Three times a day (TID) | RESPIRATORY_TRACT | Status: DC
  Administered 2021-01-07: 1.25 mg via RESPIRATORY_TRACT
  Filled 2021-01-07: qty 0.5

## 2021-01-07 MED ORDER — LEVALBUTEROL HCL 1.25 MG/0.5ML IN NEBU
1.2500 mg | INHALATION_SOLUTION | Freq: Two times a day (BID) | RESPIRATORY_TRACT | Status: DC
  Administered 2021-01-08 – 2021-01-09 (×3): 1.25 mg via RESPIRATORY_TRACT
  Filled 2021-01-07 (×3): qty 0.5

## 2021-01-07 MED ORDER — OXYCODONE HCL 5 MG PO TABS
10.0000 mg | ORAL_TABLET | Freq: Four times a day (QID) | ORAL | Status: DC
Start: 2021-01-07 — End: 2021-01-09
  Administered 2021-01-07 – 2021-01-09 (×9): 10 mg via ORAL
  Filled 2021-01-07 (×9): qty 2

## 2021-01-07 MED ORDER — TORSEMIDE 20 MG PO TABS
20.0000 mg | ORAL_TABLET | Freq: Every day | ORAL | Status: DC
  Administered 2021-01-07 – 2021-01-09 (×3): 20 mg via ORAL
  Filled 2021-01-07 (×3): qty 1

## 2021-01-07 MED ORDER — MOMETASONE FURO-FORMOTEROL FUM 200-5 MCG/ACT IN AERO
2.0000 | INHALATION_SPRAY | Freq: Two times a day (BID) | RESPIRATORY_TRACT | Status: DC
  Administered 2021-01-07 – 2021-01-09 (×6): 2 via RESPIRATORY_TRACT
  Filled 2021-01-07 (×3): qty 8.8

## 2021-01-07 MED ORDER — ALPRAZOLAM 0.25 MG PO TABS
0.2500 mg | ORAL_TABLET | Freq: Two times a day (BID) | ORAL | Status: DC | PRN
  Administered 2021-01-07 – 2021-01-09 (×4): 0.25 mg via ORAL
  Filled 2021-01-07 (×4): qty 1

## 2021-01-07 MED ORDER — ALBUTEROL SULFATE (2.5 MG/3ML) 0.083% IN NEBU: 2.5000 mg | INHALATION_SOLUTION | Freq: Four times a day (QID) | RESPIRATORY_TRACT | Status: DC | PRN

## 2021-01-07 MED ORDER — MONTELUKAST SODIUM 10 MG PO TABS
10.0000 mg | ORAL_TABLET | Freq: Every day | ORAL | Status: DC
  Administered 2021-01-08: 10 mg via ORAL
  Filled 2021-01-07: qty 1

## 2021-01-07 MED ORDER — MONTELUKAST SODIUM 10 MG PO TABS: 10.0000 mg | ORAL_TABLET | Freq: Every day | ORAL | Status: DC

## 2021-01-07 MED ORDER — PRAVASTATIN SODIUM 10 MG PO TABS
10.0000 mg | ORAL_TABLET | Freq: Every day | ORAL | Status: DC
  Administered 2021-01-07 – 2021-01-09 (×3): 10 mg via ORAL
  Filled 2021-01-07 (×3): qty 1

## 2021-01-07 MED ORDER — IPRATROPIUM BROMIDE 0.02 % IN SOLN: 0.5000 mg | RESPIRATORY_TRACT | Status: DC | PRN

## 2021-01-07 MED ORDER — IPRATROPIUM BROMIDE 0.02 % IN SOLN
0.5000 mg | Freq: Two times a day (BID) | RESPIRATORY_TRACT | Status: DC
  Administered 2021-01-08 – 2021-01-09 (×3): 0.5 mg via RESPIRATORY_TRACT
  Filled 2021-01-07 (×3): qty 2.5

## 2021-01-07 MED ORDER — IPRATROPIUM BROMIDE 0.02 % IN SOLN
0.5000 mg | Freq: Three times a day (TID) | RESPIRATORY_TRACT | Status: DC
  Administered 2021-01-07: 0.5 mg via RESPIRATORY_TRACT
  Filled 2021-01-07: qty 2.5

## 2021-01-07 MED ORDER — IPRATROPIUM BROMIDE 0.02 % IN SOLN: 0.5000 mg | Freq: Three times a day (TID) | RESPIRATORY_TRACT | Status: DC

## 2021-01-07 MED ORDER — LEVALBUTEROL HCL 1.25 MG/0.5ML IN NEBU: 1.2500 mg | INHALATION_SOLUTION | Freq: Three times a day (TID) | RESPIRATORY_TRACT | Status: DC

## 2021-01-07 MED ORDER — FLUTICASONE PROPIONATE 50 MCG/ACT NA SUSP: 1.0000 | Freq: Every day | NASAL | Status: DC | PRN

## 2021-01-07 MED ORDER — PANTOPRAZOLE SODIUM 40 MG PO TBEC
40.0000 mg | DELAYED_RELEASE_TABLET | Freq: Every day | ORAL | Status: DC
  Administered 2021-01-07 – 2021-01-09 (×3): 40 mg via ORAL
  Filled 2021-01-07 (×3): qty 1

## 2021-01-07 MED ORDER — IPRATROPIUM BROMIDE 0.02 % IN SOLN
0.5000 mg | Freq: Four times a day (QID) | RESPIRATORY_TRACT | Status: DC
  Administered 2021-01-07: 0.5 mg via RESPIRATORY_TRACT
  Filled 2021-01-07: qty 2.5

## 2021-01-07 MED ORDER — METHYLPREDNISOLONE SODIUM SUCC 40 MG IJ SOLR
40.0000 mg | Freq: Two times a day (BID) | INTRAMUSCULAR | Status: DC
  Administered 2021-01-07 – 2021-01-08 (×3): 40 mg via INTRAVENOUS
  Filled 2021-01-07 (×3): qty 1

## 2021-01-07 MED ORDER — IPRATROPIUM-ALBUTEROL 0.5-2.5 (3) MG/3ML IN SOLN
3.0000 mL | Freq: Three times a day (TID) | RESPIRATORY_TRACT | Status: DC
  Administered 2021-01-07 (×2): 3 mL via RESPIRATORY_TRACT
  Filled 2021-01-07 (×2): qty 3

## 2021-01-07 MED ORDER — AZITHROMYCIN 250 MG PO TABS
250.0000 mg | ORAL_TABLET | Freq: Every day | ORAL | Status: DC
  Administered 2021-01-07 – 2021-01-09 (×3): 250 mg via ORAL
  Filled 2021-01-07 (×3): qty 1

## 2021-01-07 MED ORDER — DM-GUAIFENESIN ER 30-600 MG PO TB12
1.0000 | ORAL_TABLET | Freq: Two times a day (BID) | ORAL | Status: DC
  Administered 2021-01-07 – 2021-01-09 (×5): 1 via ORAL
  Filled 2021-01-07 (×5): qty 1

## 2021-01-07 NOTE — Progress Notes (Signed)
PROGRESS NOTE    Glenda Terry  DTO:[id number] DOB: [birth year] DOA: 01/06/2021 PCP: Celene Squibb, MD    Chief Complaint  Patient presents with   Shortness of Breath    Brief Narrative:    Subjective:  Continue to have congested cough, denies chest pain, wants cough meds C/o tachycardia with nebs treatment Reports sinus headache Wants her  demadex restarted    Assessment & Plan:   Principal Problem:   Asthma exacerbation Active Problems:   HTN (hypertension)   GERD (gastroesophageal reflux disease)   CKD (chronic kidney disease) stage 3, GFR 30-59 ml/min (HCC)   COPD (chronic obstructive pulmonary disease) (HCC)   Hypokalemia   Hypermagnesemia   Dehydration   Hiatal hernia   Leukocytosis   Mixed hyperlipidemia   Chronic diastolic CHF (congestive heart failure) (HCC)   Chronic pain syndrome   Obesity (BMI 35.0-39.9 without comorbidity)  Asthma/ COPD exacerbation: Covid 19 screening negative She was started on IL5inhibitor in 2018 due to recurrent episodes of acute resp failure having required NIPPV in the past. She has been steroid dependent  Reports Has not had a flare up in 61yr 32months since started IL5 inhibitor/immunomodulator  Berna Bue).  Last received on 10/20 along with steroids injection Currently on iv steroids, change DuoNeb to Xopenex/Atrovent due to complaining of tachycardia on DuoNeb Continue Mucinex, Zithromax Continue to have a lot of cough   Hypokalemia/hypophosphatemia Replace, recheck in the morning  AKI vs CKD Creatinine 1.32 on presentation Creatinine today is 1.16 Creatinine 0.95 on September/03/2020 Repeat BMP in the morning  Chronic diastolic CHF Echocardiogram done on 08/01/2020 showed LVEF of 60 to 65%.  LV diastolic parameters showed grade 1 DD Home medication torsemide held on presentation, resume today per patient's request Monitor volume status  Moderate sized hiatal hernia -wonder if contribute to cough/asthma like  symptoms -Continue PPI  Chronic back pain, continue home medication oxycodone , she declined PT evaL  Anxiety Continue home medication Xanax   Nutritional Assessment:  The patient's BMI is: Body mass index is 35.54 kg/m.Marland Kitchen       Unresulted Labs (From admission, onward)     Start     Ordered   01/13/21 0500  Creatinine, serum  (enoxaparin (LOVENOX)    CrCl >/= 30 ml/min)  Weekly,   R     Comments: while on enoxaparin therapy    01/06/21 2145   01/08/21 9833  Basic metabolic panel  Tomorrow morning,   R        01/07/21 1725   01/08/21 0500  Phosphorus  Tomorrow morning,   R        01/07/21 1725   01/07/21 1600  Respiratory (~20 pathogens) panel by PCR  Once,   R        01/07/21 1600              DVT prophylaxis: enoxaparin (LOVENOX) injection 40 mg Start: 01/06/21 2200 SCDs Start: 01/06/21 2146   Code Status: Full Family Communication: daughter over the phone Disposition:   Status is: Inpatient   Dispo: The patient is from: senior independent living              Anticipated d/c is to: Return to independent living              Anticipated d/c date is: 24 to 48 hours, pending on respiratory status improvement                Consultants:  None  Procedures:  None  Antimicrobials:   Anti-infectives (From admission, onward)    Start     Dose/Rate Route Frequency Ordered Stop   01/07/21 1000  azithromycin (ZITHROMAX) tablet 250 mg        250 mg Oral Daily 01/07/21 0033 01/11/21 0959   01/06/21 2030  azithromycin (ZITHROMAX) tablet 500 mg        500 mg Oral  Once 01/06/21 2018 01/06/21 2041   01/06/21 2015  azithromycin (ZITHROMAX) 500 mg in sodium chloride 0.9 % 250 mL IVPB  Status:  Discontinued        500 mg 250 mL/hr over 60 Minutes Intravenous Every 24 hours 01/06/21 2013 01/06/21 2018           Objective: Vitals:   01/07/21 1727 01/07/21 1821 01/07/21 2022 01/07/21 2024  BP: (!) 150/80 135/72    Pulse: (!) 107 (!) 108    Resp: 20 20     Temp: 98.2 F (36.8 C) 98.4 F (36.9 C)    TempSrc: Oral     SpO2: 100% 100% 97% 97%  Weight:      Height:        Intake/Output Summary (Last 24 hours) at 01/07/2021 2224 Last data filed at 01/07/2021 1005 Gross per 24 hour  Intake --  Output 550 ml  Net -550 ml   Filed Weights   01/06/21 2041  Weight: 82.6 kg    Examination:  General exam: alert, awake, communicative,calm, NAD,. HARD OF HEARING , anxious Respiratory system: scatter bilateral wheezing. Congested cough, Respiratory effort normal. Cardiovascular system:  RRR.  Gastrointestinal system: Abdomen is nondistended, soft and nontender.  Normal bowel sounds heard. Central nervous system: Alert and oriented. No focal neurological deficits. Extremities:  no edema Skin: No rashes, lesions or ulcers Psychiatry: anxious .     Data Reviewed: I have personally reviewed following labs and imaging studies  CBC: Recent Labs  Lab 01/06/21 1741 01/07/21 0339  WBC 13.1* 10.1  NEUTROABS 9.1*  --   HGB 11.9* 10.9*  HCT 37.7 35.3*  MCV 81.8 81.3  PLT 267 086    Basic Metabolic Panel: Recent Labs  Lab 01/06/21 1741 01/06/21 1911 01/07/21 0339  NA 136  --  135  K 2.8*  --  3.9  CL 94*  --  98  CO2 31  --  25  GLUCOSE 102*  --  157*  BUN 20  --  17  CREATININE 1.32*  --  1.16*  CALCIUM 8.5*  --  8.2*  MG  --  3.9* 2.7*  PHOS  --   --  1.9*    GFR: Estimated Creatinine Clearance: 38 mL/min (A) (by C-G formula based on SCr of 1.16 mg/dL (H)).  Liver Function Tests: Recent Labs  Lab 01/07/21 0339  AST 26  ALT 21  ALKPHOS 50  BILITOT 0.7  PROT 6.5  ALBUMIN 3.4*    CBG: No results for input(s): GLUCAP in the last 168 hours.   Recent Results (from the past 240 hour(s))  Resp Panel by RT-PCR (Flu A&B, Covid) Nasopharyngeal Swab     Status: None   Collection Time: 01/06/21  5:51 PM   Specimen: Nasopharyngeal Swab; Nasopharyngeal(NP) swabs in vial transport medium  Result Value Ref Range Status    SARS Coronavirus 2 by RT PCR NEGATIVE NEGATIVE Final    Comment: (NOTE) SARS-CoV-2 target nucleic acids are NOT DETECTED.  The SARS-CoV-2 RNA is generally detectable in upper respiratory specimens during the acute phase of infection. The  lowest concentration of SARS-CoV-2 viral copies this assay can detect is 138 copies/mL. A negative result does not preclude SARS-Cov-2 infection and should not be used as the sole basis for treatment or other patient management decisions. A negative result may occur with  improper specimen collection/handling, submission of specimen other than nasopharyngeal swab, presence of viral mutation(s) within the areas targeted by this assay, and inadequate number of viral copies(<138 copies/mL). A negative result must be combined with clinical observations, patient history, and epidemiological information. The expected result is Negative.  Fact Sheet for Patients:  EntrepreneurPulse.com.au  Fact Sheet for Healthcare Providers:  IncredibleEmployment.be  This test is no t yet approved or cleared by the [place] FDA and  has been authorized for detection and/or diagnosis of SARS-CoV-2 by FDA under an Emergency Use Authorization (EUA). This EUA will remain  in effect (meaning this test can be used) for the duration of the COVID-19 declaration under Section 564(b)(1) of the Act, 21 U.S.C.section 360bbb-3(b)(1), unless the authorization is terminated  or revoked sooner.       Influenza A by PCR NEGATIVE NEGATIVE Final   Influenza B by PCR NEGATIVE NEGATIVE Final    Comment: (NOTE) The Xpert Xpress SARS-CoV-2/FLU/RSV plus assay is intended as an aid in the diagnosis of influenza from Nasopharyngeal swab specimens and should not be used as a sole basis for treatment. Nasal washings and aspirates are unacceptable for Xpert Xpress SARS-CoV-2/FLU/RSV testing.  Fact Sheet for  Patients: EntrepreneurPulse.com.au  Fact Sheet for Healthcare Providers: IncredibleEmployment.be  This test is not yet approved or cleared by the [place] FDA and has been authorized for detection and/or diagnosis of SARS-CoV-2 by FDA under an Emergency Use Authorization (EUA). This EUA will remain in effect (meaning this test can be used) for the duration of the COVID-19 declaration under Section 564(b)(1) of the Act, 21 U.S.C. section 360bbb-3(b)(1), unless the authorization is terminated or revoked.  Performed at [facility], [address] [place], [place] [address]   Blood culture (routine x 2)     Status: None (Preliminary result)   Collection Time: 01/06/21  8:13 PM   Specimen: Right Antecubital; Blood  Result Value Ref Range Status   Specimen Description RIGHT ANTECUBITAL  Final   Special Requests   Final    Blood Culture adequate volume BOTTLES DRAWN AEROBIC AND ANAEROBIC   Culture   Final    NO GROWTH < 12 HOURS Performed at [facility], [address] [place], [place] [address]    Report Status PENDING  Incomplete  Blood culture (routine x 2)     Status: None (Preliminary result)   Collection Time: 01/06/21  9:12 PM   Specimen: BLOOD  Result Value Ref Range Status   Specimen Description BLOOD LEFT ANTECUBITAL  Final   Special Requests   Final    BOTTLES DRAWN AEROBIC AND ANAEROBIC Blood Culture adequate volume   Culture   Final    NO GROWTH < 12 HOURS Performed at [facility], [address], [place], [place] [address]    Report Status PENDING  Incomplete         Radiology Studies: DG Chest 2 View  Result Date: 01/06/2021 CLINICAL DATA:  CHF, shortness of breath. EXAM: CHEST - 2 VIEW COMPARISON:  Chest x-ray 11/11/2020. FINDINGS: Moderate size hiatal hernia is again seen. The cardiomediastinal silhouette is within normal limits. There is no focal lung consolidation, pleural effusion, or pneumothorax. Bilateral  shoulder arthroplasties are present. No acute fractures. IMPRESSION: 1.  No acute cardiopulmonary process. 2. Stable moderate-sized hiatal hernia. Electronically Signed   By: Ronney Asters M.D.   On: 01/06/2021 17:50        Scheduled Meds:  amLODipine  10 mg Oral Daily   azithromycin  250 mg Oral Daily   dextromethorphan-guaiFENesin  1 tablet Oral BID   enoxaparin (LOVENOX) injection  40 mg Subcutaneous Q24H   fluticasone  1 spray Each Nare Daily   [START ON 01/08/2021] ipratropium  0.5 mg Nebulization BID   [START ON 01/08/2021] levalbuterol  1.25 mg Nebulization BID   methylPREDNISolone (SOLU-MEDROL) injection  40 mg Intravenous Q12H   mometasone-formoterol  2 puff Inhalation BID   [START ON 01/08/2021] montelukast  10 mg Oral QHS   oxyCODONE  10 mg Oral Q6H   pantoprazole  40 mg Oral Daily   potassium chloride  20 mEq Oral Daily   pravastatin  10 mg Oral Daily   torsemide  20 mg Oral Daily   Continuous Infusions:   LOS: 0 days   Time spent: 65mins Greater than 50% of this time was spent in counseling, explanation of diagnosis, planning of further management, and coordination of care.   Voice Recognition Viviann Spare dictation system was used to create this note, attempts have been made to correct errors. Please contact the author with questions and/or clarifications.   Florencia Reasons, MD PhD FACP [facility]  Available via Epic secure chat 7am-7pm for nonurgent issues Please page for urgent issues To page the attending provider between 7A-7P or the covering provider during after hours 7P-7A, please log into the web site [email or web address] and access using universal [place] password for that web site. If you do not have the password, please call the hospital operator.    01/07/2021, 10:24 PM

## 2021-01-07 NOTE — ED Notes (Signed)
Replaced purewick with new one old was soiled. Changed out urine canister

## 2021-01-08 LAB — BASIC METABOLIC PANEL
Anion gap: 9 (ref 5–15)
BUN: 20 mg/dL (ref 8–23)
CO2: 27 mmol/L (ref 22–32)
Calcium: 8 mg/dL — ABNORMAL LOW (ref 8.9–10.3)
Chloride: 96 mmol/L — ABNORMAL LOW (ref 98–111)
Creatinine, Ser: 1.15 mg/dL — ABNORMAL HIGH (ref 0.44–1.00)
GFR, Estimated: 49 mL/min — ABNORMAL LOW (ref >60)
Glucose, Bld: 135 mg/dL — ABNORMAL HIGH (ref 70–99)
Potassium: 3.4 mmol/L — ABNORMAL LOW (ref 3.5–5.1)
Sodium: 132 mmol/L — ABNORMAL LOW (ref 135–145)

## 2021-01-08 LAB — PHOSPHORUS: Phosphorus: 2.3 mg/dL — ABNORMAL LOW (ref 2.5–4.6)

## 2021-01-08 MED ORDER — K PHOS MONO-SOD PHOS DI & MONO 155-852-130 MG PO TABS
250.0000 mg | ORAL_TABLET | Freq: Two times a day (BID) | ORAL | Status: DC
  Administered 2021-01-08 – 2021-01-09 (×3): 250 mg via ORAL
  Filled 2021-01-08 (×3): qty 1

## 2021-01-08 MED ORDER — PREDNISONE 20 MG PO TABS
40.0000 mg | ORAL_TABLET | Freq: Every day | ORAL | Status: DC
  Administered 2021-01-09: 40 mg via ORAL
  Filled 2021-01-08: qty 2

## 2021-01-08 NOTE — TOC Initial Note (Signed)
Transition of Care [facility]) - Initial/Assessment Note    Patient Details  Name: Glenda Terry MRN: [id number] Date of Birth: 1941/02/20  Transition of [facility]) CM/SW Contact:    Salome Arnt, [place] Phone Number: 01/08/2021, 10:00 AM  Clinical Narrative:  Pt admitted for asthma exacerbation. Assessment completed due to high risk readmission score. Pt reports she lives alone. When asked about how she manages at home, pt states, "I am slow, but I get it all done." She ambulates with a walker outside the home. Pt indicates she relies on [facility] or her daughter for transportation. Pt plans to return home when medically stable. No needs reported at this time. TOC will continue to follow.                  Expected Discharge Plan: Home/Self Care Barriers to Discharge: Continued Medical Work up   Patient Goals and CMS Choice Patient states their goals for this hospitalization and ongoing recovery are:: return home   Choice offered to / list presented to : Patient  Expected Discharge Plan and Services Expected Discharge Plan: Home/Self Care In-house Referral: Clinical Social Work     Living arrangements for the past 2 months: Apartment                 DME Arranged: N/A                    Prior Living Arrangements/Services Living arrangements for the past 2 months: Apartment Lives with:: Self Patient language and need for interpreter reviewed:: Yes Do you feel safe going back to the place where you live?: Yes      Need for Family Participation in Patient Care: No (Comment)   Current home services: DME (cane, walker, shower chair) Criminal Activity/Legal Involvement Pertinent to Current Situation/Hospitalization: No - Comment as needed  Activities of Daily Living Home Assistive Devices/Equipment: Eyeglasses, Dentures (specify type), Cane (specify quad or straight), Shower chair with back, Walker (specify type), Grab bars in shower, Grab bars around toilet ADL  Screening (condition at time of admission) Patient's cognitive ability adequate to safely complete daily activities?: Yes Is the patient deaf or have difficulty hearing?: No Does the patient have difficulty seeing, even when wearing glasses/contacts?: No Does the patient have difficulty concentrating, remembering, or making decisions?: No Patient able to express need for assistance with ADLs?: Yes Does the patient have difficulty dressing or bathing?: No Independently performs ADLs?: Yes (appropriate for developmental age) Does the patient have difficulty walking or climbing stairs?: Yes Weakness of Legs: Both Weakness of Arms/Hands: None  Permission Sought/Granted                  Emotional Assessment   Attitude/Demeanor/Rapport: Engaged Affect (typically observed): Accepting Orientation: : Oriented to Self, Oriented to Place, Oriented to  Time, Oriented to Situation Alcohol / Substance Use: Not Applicable Psych Involvement: No (comment)  Admission diagnosis:  Hypokalemia [E87.6] Moderate persistent asthma with exacerbation [J45.41] Asthma exacerbation [J45.901] Patient Active Problem List   Diagnosis Date Noted   Hypokalemia 01/07/2021   Hypermagnesemia 01/07/2021   Dehydration 01/07/2021   Hiatal hernia 01/07/2021   Leukocytosis 01/07/2021   Mixed hyperlipidemia 01/07/2021   Chronic diastolic CHF (congestive heart failure) ([place]) 01/07/2021   Chronic pain syndrome 01/07/2021   Obesity (BMI 35.0-39.9 without comorbidity) 01/07/2021   Abdominal pain 11/11/2020   Incisional hernia, without obstruction or gangrene    Nausea with vomiting 01/14/2020   Chronic bilateral low back pain  10/25/2019   Chronic constipation 10/22/2019   Bilateral lower extremity edema 10/22/2019   Chronic anemia 10/22/2019   Post-menopausal osteoporosis 10/22/2019   Tachycardia 10/18/2019   Primary osteoarthritis of knee 10/16/2019   Primary osteoarthritis of left knee 10/15/2019   Left  lower quadrant abdominal pain 02/16/2019   Hx of adenomatous colonic polyps 11/28/2017   S/P shoulder replacement, 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   Eosinophil count raised 06/09/2016   Severe persistent asthma 06/07/2016   COPD (chronic obstructive pulmonary disease) ([place]) 12/16/2015   Asthma exacerbation 09/20/2015   Acute respiratory failure ([place]) 09/20/2015   CKD (chronic kidney disease) stage 3, GFR 30-59 ml/min ([place]) 09/20/2015   Hemorrhoids, internal 08/21/2015   S/P shoulder replacement 05/24/2014   Microcytic anemia 11/01/2012   Family hx of colon cancer 11/01/2012   Spinal stenosis of lumbar region without neurogenic claudication 04/20/2012   Herniated lumbar intervertebral disc 04/20/2012   Difficulty in walking(719.7) 03/13/2012   Stiffness of joint, not elsewhere classified, lower leg 03/13/2012   Weakness of right leg 03/13/2012   OA (osteoarthritis) of knee 01/24/2012   Anxiety 02/09/2011   HTN (hypertension) 02/09/2011   Arthritis 02/09/2011   GERD (gastroesophageal reflux disease) 02/09/2011   Rheumatoid arthritis ([place]) 02/09/2011   PCP:  Celene Squibb, MD Pharmacy:   [place], [place] [address] Phone: [phone] Fax: [phone]     Social Determinants of Health (SDOH) Interventions    Readmission Risk Interventions Readmission Risk Prevention Plan 01/08/2021 10/17/2019  Transportation Screening Complete Complete  PCP or Specialist Appt within 5-7 Days - Complete  Home Care Screening - Complete  Medication Review (RN CM) - Complete  Medication Review (RN Care Manager) Complete -  HRI or Home Care Consult Complete -  SW Recovery Care/Counseling Consult Complete -  Palliative Care Screening Not Applicable -  [place] Not Applicable -  Some recent data might be hidden

## 2021-01-08 NOTE — Progress Notes (Signed)
PROGRESS NOTE    Glenda Terry  YTK:[id number] DOB: Dec 18, 1940 DOA: 01/06/2021 PCP: Celene Squibb, MD    Chief Complaint  Patient presents with   Shortness of Breath    Brief Narrative:   History of hypertension, chronic diastolic CHF, history of asthma COPD present with cough, wheezing and short of breath  Subjective:  Feeling better, less cough, less wheezing,  Tachycardia has resolved   Assessment & Plan:   Principal Problem:   Asthma exacerbation Active Problems:   HTN (hypertension)   GERD (gastroesophageal reflux disease)   CKD (chronic kidney disease) stage 3, GFR 30-59 ml/min (HCC)   COPD (chronic obstructive pulmonary disease) (HCC)   Hypokalemia   Hypermagnesemia   Dehydration   Hiatal hernia   Leukocytosis   Mixed hyperlipidemia   Chronic diastolic CHF (congestive heart failure) (HCC)   Chronic pain syndrome   Obesity (BMI 35.0-39.9 without comorbidity)  Asthma/ COPD exacerbation: -She was started on IL5inhibitor in 2018 due to recurrent episodes of acute resp failure having required NIPPV in the past. She has been steroid dependent  in the past -Reports Has not had a flare up in 8yr 73months since started IL5 inhibitor/immunomodulator  Berna Bue), Last received on 10/20 along with steroids injection -She is started here on  iv steroids, change DuoNeb to Xopenex/Atrovent due to complaining of tachycardia on DuoNeb -Continue Mucinex, Zithromax --Covid 19 screening negative, respiratory viral panel negative  -Improving, taper steroids   Hypokalemia/hypophosphatemia Remain low , continue to Replace, recheck in the morning  AKI vs CKD Creatinine 1.32 on presentation Creatinine today is 1.16 Creatinine 0.95 on September/03/2020 Repeat BMP in the morning  Chronic diastolic CHF Echocardiogram done on 08/01/2020 showed LVEF of 60 to 65%.  LV diastolic parameters showed grade 1 DD Home medication torsemide held on presentation, resumed on 10/26 per  patient's request  Monitor volume status  Moderate sized hiatal hernia -wonder if contribute to cough/asthma like symptoms -Continue PPI  Chronic back pain, continue home medication oxycodone , she declined PT evaL  Anxiety Continue home medication Xanax   Nutritional Assessment:  The patient's BMI is: Body mass index is 35.54 kg/m.Marland Kitchen       Unresulted Labs (From admission, onward)     Start     Ordered   01/13/21 0500  Creatinine, serum  (enoxaparin (LOVENOX)    CrCl >/= 30 ml/min)  Weekly,   R     Comments: while on enoxaparin therapy    01/06/21 2145              DVT prophylaxis: enoxaparin (LOVENOX) injection 40 mg Start: 01/06/21 2200 SCDs Start: 01/06/21 2146   Code Status: Full Family Communication: daughter over the phone daily Disposition:   Status is: Inpatient   Dispo: The patient is from: senior apartment, walks with a walker at baseline               Anticipated d/c is to: Return to independent living              Anticipated d/c date is: possible on 10/28 if wheezing, cough continue to improve with steroids taper, patient and daughter are aware of the plan                Consultants:  None  Procedures:  None  Antimicrobials:   Anti-infectives (From admission, onward)    Start     Dose/Rate Route Frequency Ordered Stop   01/07/21 1000  azithromycin (ZITHROMAX) tablet 250 mg  250 mg Oral Daily 01/07/21 0033 01/11/21 0959   01/06/21 2030  azithromycin (ZITHROMAX) tablet 500 mg        500 mg Oral  Once 01/06/21 2018 01/06/21 2041   01/06/21 2015  azithromycin (ZITHROMAX) 500 mg in sodium chloride 0.9 % 250 mL IVPB  Status:  Discontinued        500 mg 250 mL/hr over 60 Minutes Intravenous Every 24 hours 01/06/21 2013 01/06/21 2018           Objective: Vitals:   01/08/21 0529 01/08/21 0838 01/08/21 0840 01/08/21 1230  BP: 130/87   137/79  Pulse: (!) 102   85  Resp: 18   18  Temp: 98.2 F (36.8 C)   98.4 F (36.9 C)   TempSrc: Oral     SpO2: 98% 98% 100% 98%  Weight:      Height:        Intake/Output Summary (Last 24 hours) at 01/08/2021 1911 Last data filed at 01/08/2021 1300 Gross per 24 hour  Intake 600 ml  Output 902 ml  Net -302 ml   Filed Weights   01/06/21 2041  Weight: 82.6 kg    Examination:  General exam: alert, awake, communicative,calm, NAD,. HARD OF HEARING , less anxious Respiratory system: less scatter bilateral wheezing. Less Congested cough, Respiratory effort normal. Cardiovascular system:  RRR.  Gastrointestinal system: Abdomen is nondistended, soft and nontender.  Normal bowel sounds heard. Central nervous system: Alert and oriented. No focal neurological deficits. Extremities:  no edema Skin: No rashes, lesions or ulcers Psychiatry: less anxious .     Data Reviewed: I have personally reviewed following labs and imaging studies  CBC: Recent Labs  Lab 01/06/21 1741 01/07/21 0339  WBC 13.1* 10.1  NEUTROABS 9.1*  --   HGB 11.9* 10.9*  HCT 37.7 35.3*  MCV 81.8 81.3  PLT 267 101    Basic Metabolic Panel: Recent Labs  Lab 01/06/21 1741 01/06/21 1911 01/07/21 0339 01/08/21 0555  NA 136  --  135 132*  K 2.8*  --  3.9 3.4*  CL 94*  --  98 96*  CO2 31  --  25 27  GLUCOSE 102*  --  157* 135*  BUN 20  --  17 20  CREATININE 1.32*  --  1.16* 1.15*  CALCIUM 8.5*  --  8.2* 8.0*  MG  --  3.9* 2.7*  --   PHOS  --   --  1.9* 2.3*    GFR: Estimated Creatinine Clearance: 38.4 mL/min (A) (by C-G formula based on SCr of 1.15 mg/dL (H)).  Liver Function Tests: Recent Labs  Lab 01/07/21 0339  AST 26  ALT 21  ALKPHOS 50  BILITOT 0.7  PROT 6.5  ALBUMIN 3.4*    CBG: No results for input(s): GLUCAP in the last 168 hours.   Recent Results (from the past 240 hour(s))  Resp Panel by RT-PCR (Flu A&B, Covid) Nasopharyngeal Swab     Status: None   Collection Time: 01/06/21  5:51 PM   Specimen: Nasopharyngeal Swab; Nasopharyngeal(NP) swabs in vial transport  medium  Result Value Ref Range Status   SARS Coronavirus 2 by RT PCR NEGATIVE NEGATIVE Final    Comment: (NOTE) SARS-CoV-2 target nucleic acids are NOT DETECTED.  The SARS-CoV-2 RNA is generally detectable in upper respiratory specimens during the acute phase of infection. The lowest concentration of SARS-CoV-2 viral copies this assay can detect is 138 copies/mL. A negative result does not preclude SARS-Cov-2 infection and  should not be used as the sole basis for treatment or other patient management decisions. A negative result may occur with  improper specimen collection/handling, submission of specimen other than nasopharyngeal swab, presence of viral mutation(s) within the areas targeted by this assay, and inadequate number of viral copies(<138 copies/mL). A negative result must be combined with clinical observations, patient history, and epidemiological information. The expected result is Negative.  Fact Sheet for Patients:  EntrepreneurPulse.com.au  Fact Sheet for Healthcare Providers:  IncredibleEmployment.be  This test is no t yet approved or cleared by the [place] FDA and  has been authorized for detection and/or diagnosis of SARS-CoV-2 by FDA under an Emergency Use Authorization (EUA). This EUA will remain  in effect (meaning this test can be used) for the duration of the COVID-19 declaration under Section 564(b)(1) of the Act, 21 U.S.C.section 360bbb-3(b)(1), unless the authorization is terminated  or revoked sooner.       Influenza A by PCR NEGATIVE NEGATIVE Final   Influenza B by PCR NEGATIVE NEGATIVE Final    Comment: (NOTE) The Xpert Xpress SARS-CoV-2/FLU/RSV plus assay is intended as an aid in the diagnosis of influenza from Nasopharyngeal swab specimens and should not be used as a sole basis for treatment. Nasal washings and aspirates are unacceptable for Xpert Xpress SARS-CoV-2/FLU/RSV testing.  Fact Sheet for  Patients: EntrepreneurPulse.com.au  Fact Sheet for Healthcare Providers: IncredibleEmployment.be  This test is not yet approved or cleared by the [place] FDA and has been authorized for detection and/or diagnosis of SARS-CoV-2 by FDA under an Emergency Use Authorization (EUA). This EUA will remain in effect (meaning this test can be used) for the duration of the COVID-19 declaration under Section 564(b)(1) of the Act, 21 U.S.C. section 360bbb-3(b)(1), unless the authorization is terminated or revoked.  Performed at [facility], [address], [place], [place] [address]   Blood culture (routine x 2)     Status: None (Preliminary result)   Collection Time: 01/06/21  8:13 PM   Specimen: Right Antecubital; Blood  Result Value Ref Range Status   Specimen Description RIGHT ANTECUBITAL  Final   Special Requests   Final    Blood Culture adequate volume BOTTLES DRAWN AEROBIC AND ANAEROBIC   Culture   Final    NO GROWTH 2 DAYS Performed at [facility], [address], [place], [place] [address]    Report Status PENDING  Incomplete  Blood culture (routine x 2)     Status: None (Preliminary result)   Collection Time: 01/06/21  9:12 PM   Specimen: BLOOD  Result Value Ref Range Status   Specimen Description BLOOD LEFT ANTECUBITAL  Final   Special Requests   Final    BOTTLES DRAWN AEROBIC AND ANAEROBIC Blood Culture adequate volume   Culture   Final    NO GROWTH 2 DAYS Performed at [facility], [address] [place], [place] [address]    Report Status PENDING  Incomplete  Respiratory (~20 pathogens) panel by PCR     Status: None   Collection Time: 01/07/21  3:55 PM  Result Value Ref Range Status   Adenovirus NOT DETECTED NOT DETECTED Final   Coronavirus 229E NOT DETECTED NOT DETECTED Final    Comment: (NOTE) The Coronavirus on the Respiratory Panel, DOES NOT test for the novel  Coronavirus (2019 nCoV)    Coronavirus HKU1 NOT DETECTED NOT  DETECTED Final   Coronavirus NL63 NOT DETECTED NOT DETECTED Final   Coronavirus OC43 NOT DETECTED NOT DETECTED Final  Metapneumovirus NOT DETECTED NOT DETECTED Final   Rhinovirus / Enterovirus NOT DETECTED NOT DETECTED Final   Influenza A NOT DETECTED NOT DETECTED Final   Influenza B NOT DETECTED NOT DETECTED Final   Parainfluenza Virus 1 NOT DETECTED NOT DETECTED Final   Parainfluenza Virus 2 NOT DETECTED NOT DETECTED Final   Parainfluenza Virus 3 NOT DETECTED NOT DETECTED Final   Parainfluenza Virus 4 NOT DETECTED NOT DETECTED Final   Respiratory Syncytial Virus NOT DETECTED NOT DETECTED Final   Bordetella pertussis NOT DETECTED NOT DETECTED Final   Bordetella Parapertussis NOT DETECTED NOT DETECTED Final   Chlamydophila pneumoniae NOT DETECTED NOT DETECTED Final   Mycoplasma pneumoniae NOT DETECTED NOT DETECTED Final    Comment: Performed at [facility], [place] [address], [place], [place] [address]         Radiology Studies: No results found.      Scheduled Meds:  amLODipine  10 mg Oral Daily   azithromycin  250 mg Oral Daily   dextromethorphan-guaiFENesin  1 tablet Oral BID   enoxaparin (LOVENOX) injection  40 mg Subcutaneous Q24H   fluticasone  1 spray Each Nare Daily   ipratropium  0.5 mg Nebulization BID   levalbuterol  1.25 mg Nebulization BID   mometasone-formoterol  2 puff Inhalation BID   montelukast  10 mg Oral QHS   oxyCODONE  10 mg Oral Q6H   pantoprazole  40 mg Oral Daily   phosphorus  250 mg Oral BID   potassium chloride  20 mEq Oral Daily   pravastatin  10 mg Oral Daily   [START ON 01/09/2021] predniSONE  40 mg Oral Q breakfast   torsemide  20 mg Oral Daily   Continuous Infusions:   LOS: 1 day   Time spent: 41mins Greater than 50% of this time was spent in counseling, explanation of diagnosis, planning of further management, and coordination of care.   Voice Recognition Viviann Spare dictation system was used to create this note, attempts have  been made to correct errors. Please contact the author with questions and/or clarifications.   Florencia Reasons, MD PhD FACP [facility]  Available via Epic secure chat 7am-7pm for nonurgent issues Please page for urgent issues To page the attending provider between 7A-7P or the covering provider during after hours 7P-7A, please log into the web site [email or web address] and access using universal [place] password for that web site. If you do not have the password, please call the hospital operator.    01/08/2021, 7:11 PM

## 2021-01-09 LAB — BASIC METABOLIC PANEL
Anion gap: 8 (ref 5–15)
BUN: 28 mg/dL — ABNORMAL HIGH (ref 8–23)
CO2: 29 mmol/L (ref 22–32)
Calcium: 8.2 mg/dL — ABNORMAL LOW (ref 8.9–10.3)
Chloride: 97 mmol/L — ABNORMAL LOW (ref 98–111)
Creatinine, Ser: 1.32 mg/dL — ABNORMAL HIGH (ref 0.44–1.00)
GFR, Estimated: 41 mL/min — ABNORMAL LOW (ref >60)
Glucose, Bld: 127 mg/dL — ABNORMAL HIGH (ref 70–99)
Potassium: 3.9 mmol/L (ref 3.5–5.1)
Sodium: 134 mmol/L — ABNORMAL LOW (ref 135–145)

## 2021-01-09 LAB — PHOSPHORUS: Phosphorus: 4 mg/dL (ref 2.5–4.6)

## 2021-01-09 LAB — MAGNESIUM: Magnesium: 2.4 mg/dL (ref 1.7–2.4)

## 2021-01-09 MED ORDER — HYDROCOD POLST-CPM POLST ER 10-8 MG/5ML PO SUER: 5.0000 mL | Freq: Two times a day (BID) | ORAL | 0 refills | Status: DC | PRN

## 2021-01-09 MED ORDER — AZITHROMYCIN 250 MG PO TABS: 250.0000 mg | ORAL_TABLET | Freq: Every day | ORAL | 0 refills | Status: AC

## 2021-01-09 MED ORDER — PREDNISONE 20 MG PO TABS: ORAL_TABLET | ORAL | 0 refills | Status: DC

## 2021-01-09 NOTE — Discharge Summary (Signed)
Physician Discharge Summary  Glenda Terry TGY:[id number] DOB: [birth year] DOA: 01/06/2021  PCP: Celene Squibb, MD  Admit date: 01/06/2021 Discharge date: 01/09/2021  Time spent: 35 minutes  Recommendations for Outpatient Follow-up:  Repeat basic metabolic panel to follow electrolytes renal function. Continue assisting patient with weight management. Reassess patient response to the steroids therapy and make sure she has follow-up with pulmonology service as instructed for further adjustment into her bronchodilator management.  Discharge Diagnoses:  Principal Problem:   Asthma exacerbation Active Problems:   HTN (hypertension)   GERD (gastroesophageal reflux disease)   CKD (chronic kidney disease) stage 3, GFR 30-59 ml/min (HCC)   Hypokalemia   Hypermagnesemia   Dehydration   Hiatal hernia   Leukocytosis   Mixed hyperlipidemia   Chronic diastolic CHF (congestive heart failure) (HCC)   Chronic pain syndrome   Obesity (BMI 35.0-39.9 without comorbidity)   Discharge Condition: Stable and improved.  Discharged home with instruction to follow-up with PCP and pulmonology service as an outpatient.  CODE STATUS: Full code.  Diet recommendation: Heart healthy/low-sodium and low calorie diet.  Filed Weights   01/06/21 2041  Weight: 82.6 kg    History of present illness:  As per H&P written by Dr. Josephine Cables on 01/06/2021 Glenda Terry is a 80 y.o. female with medical history significant for asthma, anxiety, stage IIIb CKD, hypertension, hyperlipidemia, GERD, diastolic CHF, chronic pain syndrome, obesity who presents to the emergency department accompanied by daughter due to worsening shortness of breath.  Patient was not able to provide a history due to increased work of breathing, history was obtained from ED PA and daughter at bedside, per daughter, patient has been having increased shortness of breath for about a week and she has been having nasal congestion and increased  nonproductive coughing fits which was worse at night.  She used home breathing treatments (inhaler and nebulizer) without any improvement, so she decided to go to the ED for further evaluation and management.  She denies fever, chills, blurry vision, abdominal pain, nausea or vomiting.   ED Course:  In the emergency department, patient was hemodynamically stable, work-up in the ED showed magnesium 3.9, troponin x2 was negative, sodium 136, potassium 2.8, chloride 94, CO2 31, glucose 102, BUN 20, creatinine 1.32, GFR 41.  CBC showed WBC 13.1, hemoglobin 11.9, hematocrit 37.7, MCV 81.8, platelets 267.  Influenza A, B, SARS coronavirus 2 was negative. Chest x-ray showed no acute cardiopulmonary process, but showed stable moderate sized hiatal hernia Breathing treatment was provided, Solu-Medrol was given, potassium was replenished, IV hydration was provided.  Hospitalist was asked to admit patient for further evaluation and management.  Hospital Course:  1-asthma exacerbation -With excellent response to IV steroids, nebulizer management and antitussive drugs. -Patient has been discharged home with instructions to complete steroids tapering using oral prednisone; resumption of her home bronchodilators management and outpatient follow-up with pulmonology service. -COVID-19 screening was negative, respiratory viral panel was also negative. -There was bronchitis changes on x-ray it and the patient has been treated with Zithromax.  2-hypokalemia/hypophosphatemia -Repleted and within normal limits at discharge -Continue daily maintenance supplementation of potassium -Patient instructed to maintain adequate hydration -Repeat basic metabolic panel, magnesium and phosphorus level at follow-up visit to assess electrolytes stability.  3-acute kidney injury on chronic kidney disease a stage IIIb -Appears to be secondary to prerenal azotemia -Excellent response to fluid resuscitation and discontinuation of  nephrotoxic agents -At discharge renal function back to baseline. -Patient advised to maintain adequate hydration -Safe  to resume home diuretic regimens.  4-chronic diastolic heart failure -Grade 1 diastolic dysfunction with preserved ejection fraction appreciated on echo in May 2022 -Condition currently compensated -Continue to closely follow volume status and resume the use of diuretics as previously prescribed. -Patient instructed to check her weight on daily basis and to follow low-sodium diet.  5-gastroesophageal reflux disease/hiatal hernia -Continue PPI twice a day.  6-history of chronic back pain -Patient in agreement to participate with physical therapy; recommendations for home health at time of discharge provided. -TOC made aware for home health services to be arranged. -Continue home analgesic management.  7-class II obesity -Body mass index is 35.54 kg/m. -Low calorie diet, portion control and increase physical activity discussed with patient.  Procedures: See below for x-ray reports.  Consultations: None  Discharge Exam: Vitals:   01/09/21 0835 01/09/21 1228  BP:  131/82  Pulse:  100  Resp:  18  Temp:  98.1 F (36.7 C)  SpO2: 100% 100%    General: Afebrile, no chest pain, no nausea, no vomiting, good saturation on room air and able to speak in full sentences. Cardiovascular: S1 and S2, no rubs, no gallops, no JVD. Respiratory: Good air movement bilaterally, no wheezing or crackles on examination.  Normal respiratory effort. Abdomen: Obese, soft, nontender, nontender, positive bowel sounds Extremities: No cyanosis or clubbing.  Discharge Instructions   Discharge Instructions     (HEART FAILURE PATIENTS) Call MD:  Anytime you have any of the following symptoms: 1) 3 pound weight gain in 24 hours or 5 pounds in 1 week 2) shortness of breath, with or without a dry hacking cough 3) swelling in the hands, feet or stomach 4) if you have to sleep on extra  pillows at night in order to breathe.   Complete by: As directed    Discharge instructions   Complete by: As directed    Take medications as prescribed Arrange follow-up with PCP in 10 days Follow-up with pulmonologist as previously instructed. Maintain adequate hydration. Follow heart healthy/low-sodium diet. Check your weight on daily basis Make sure to pace yourself while performing activities to minimize the chances of experiencing significant short winded sensation.   Discharge wound care:   Complete by: As directed    Keep area clean and dry; make sure to change position regularly to minimize pressure on the same area.      Allergies as of 01/09/2021       Reactions   Flexeril [cyclobenzaprine] Anaphylaxis   Other Anaphylaxis, Other (See Comments)   ALL TREE NUTS   Keflex [cephalexin] Itching   Aspirin Other (See Comments)   Cannot take due to ulcers   Fentanyl Other (See Comments)   Lip Swelling   Adhesive [tape] Rash   Chlorhexidine Rash        Medication List     STOP taking these medications    Biofreeze 4 % Gel Generic drug: Menthol (Topical Analgesic)   Magnesium 250 MG Tabs   nystatin 100000 UNIT/ML suspension Commonly known as: MYCOSTATIN   ondansetron 4 MG disintegrating tablet Commonly known as: Zofran ODT       TAKE these medications    ALPRAZolam 0.5 MG tablet Commonly known as: XANAX Take 0.5 tablets (0.25 mg total) by mouth 2 (two) times daily as needed for anxiety.   amLODipine 10 MG tablet Commonly known as: NORVASC Take 10 mg by mouth daily. What changed: Another medication with the same name was removed. Continue taking this medication, and follow the  directions you see here.   APAP 500 PO Take 2 tablets by mouth 2 (two) times daily as needed.   azithromycin 250 MG tablet Commonly known as: ZITHROMAX Take 1 tablet (250 mg total) by mouth daily for 3 days.   Benralizumab 30 MG/ML Sosy Inject 30 mg into the skin. Once A Day  Every 56 Days   bisacodyl 5 MG EC tablet Commonly known as: DULCOLAX Take 5 mg by mouth daily as needed for moderate constipation.   calcium carbonate 600 MG Tabs tablet Commonly known as: OS-CAL Take 600 mg by mouth 2 (two) times daily with a meal.   chlorpheniramine-HYDROcodone 10-8 MG/5ML Suer Commonly known as: Tussionex Pennkinetic ER Take 5 mLs by mouth every 12 (twelve) hours as needed for cough.   denosumab 60 MG/ML Sosy injection Commonly known as: PROLIA Inject 60 mg into the skin every 6 (six) months.   diphenhydrAMINE 25 mg capsule Commonly known as: BENADRYL Take 25 mg by mouth at bedtime.   Dulera 200-5 MCG/ACT Aero Generic drug: mometasone-formoterol Inhale 2 puffs into the lungs 2 (two) times daily.   esomeprazole 40 MG capsule Commonly known as: NEXIUM Take 1 capsule (40 mg total) by mouth 2 (two) times daily.   ferrous sulfate 325 (65 FE) MG tablet Take 1 tablet (325 mg total) by mouth daily with breakfast.   fexofenadine 180 MG tablet Commonly known as: ALLEGRA Take 180 mg by mouth daily.   fluticasone 50 MCG/ACT nasal spray Commonly known as: FLONASE Place 1 spray into both nostrils daily as needed for allergies or rhinitis.   ipratropium-albuterol 0.5-2.5 (3) MG/3ML Soln Commonly known as: DUONEB Take 3 mLs by nebulization every 4 (four) hours as needed.   ketotifen 0.025 % ophthalmic solution Commonly known as: ZADITOR Place 1 drop into both eyes 2 (two) times daily as needed (allergies).   montelukast 10 MG tablet Commonly known as: SINGULAIR Take 1 tablet (10 mg total) by mouth at bedtime.   Oxycodone HCl 10 MG Tabs Take 10 mg by mouth every 6 (six) hours.   polyethylene glycol 17 g packet Commonly known as: MIRALAX / GLYCOLAX Take 17 g by mouth daily.   potassium chloride 10 MEQ tablet Commonly known as: KLOR-CON Take 1 tablet (10 mEq total) by mouth every other day. IN THE MORNING What changed:  how much to take when to take  this   pravastatin 10 MG tablet Commonly known as: PRAVACHOL Take 10 mg by mouth daily.   predniSONE 20 MG tablet Commonly known as: DELTASONE Take 3 tablets by mouth daily x1 day; then 2 tablets by mouth daily x2 days; then 1 tablet by mouth daily x2 days; then half tablet by mouth daily x3 days and stop prednisone.   ProAir HFA 108 (90 Base) MCG/ACT inhaler Generic drug: albuterol Inhale 2 puffs into the lungs every 6 (six) hours as needed for wheezing or shortness of breath (For COPD).   SYSTANE OP Place 1 drop into both eyes 3 (three) times daily as needed (dry/irritated eyes.).   torsemide 10 MG tablet Commonly known as: DEMADEX Take 20 mg by mouth daily.   vitamin C 500 MG tablet Commonly known as: ASCORBIC ACID Take 500 mg by mouth daily.   vitamin D3 50 MCG (2000 UT) Caps Take 1 capsule by mouth daily.               Discharge Care Instructions  (From admission, onward)  Start     Ordered   01/09/21 0000  Discharge wound care:       Comments: Keep area clean and dry; make sure to change position regularly to minimize pressure on the same area.   01/09/21 1443           Allergies  Allergen Reactions   Flexeril [Cyclobenzaprine] Anaphylaxis   Other Anaphylaxis and Other (See Comments)    ALL TREE NUTS   Keflex [Cephalexin] Itching   Aspirin Other (See Comments)    Cannot take due to ulcers   Fentanyl Other (See Comments)    Lip Swelling   Adhesive [Tape] Rash   Chlorhexidine Rash    Follow-up Information     Celene Squibb, MD Follow up in 1 week(s).   Specialty: Internal Medicine Why: hospital discharge follow up, pcp to repeat basic labs to monitor potassium level Contact information: [place] [facility] [address] [phone]         Elouise Munroe, MD .   Specialties: Cardiology, Radiology Contact information: [address] [place] [address] [phone]         Daneil Dolin,  MD Follow up.   Specialty: Gastroenterology Why: moderate-sized  hiatal hernia Contact information: [address] [place] [address] [phone]         Ross Marcus, MD Follow up in 1 week(s).   Specialty: Pulmonary Disease Contact information: [place] [address] [phone]                  The results of significant diagnostics from this hospitalization (including imaging, microbiology, ancillary and laboratory) are listed below for reference.    Significant Diagnostic Studies: DG Chest 2 View  Result Date: 01/06/2021 CLINICAL DATA:  CHF, shortness of breath. EXAM: CHEST - 2 VIEW COMPARISON:  Chest x-ray 11/11/2020. FINDINGS: Moderate size hiatal hernia is again seen. The cardiomediastinal silhouette is within normal limits. There is no focal lung consolidation, pleural effusion, or pneumothorax. 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 No acute fractures. IMPRESSION: 1. No acute cardiopulmonary process. 2. Stable moderate-sized hiatal hernia. Electronically Signed   By: Ronney Asters M.D.   On: 01/06/2021 17:50    Microbiology: Recent Results (from the past 240 hour(s))  Resp Panel by RT-PCR (Flu A&B, Covid) Nasopharyngeal Swab     Status: None   Collection Time: 01/06/21  5:51 PM   Specimen: Nasopharyngeal Swab; Nasopharyngeal(NP) swabs in vial transport medium  Result Value Ref Range Status   SARS Coronavirus 2 by RT PCR NEGATIVE NEGATIVE Final    Comment: (NOTE) SARS-CoV-2 target nucleic acids are NOT DETECTED.  The SARS-CoV-2 RNA is generally detectable in upper respiratory specimens during the acute phase of infection. The lowest concentration of SARS-CoV-2 viral copies this assay can detect is 138 copies/mL. A negative result does not preclude SARS-Cov-2 infection and should not be used as the sole basis for treatment or other patient management decisions. A negative result may occur with  improper specimen  collection/handling, submission of specimen other than nasopharyngeal swab, presence of viral mutation(s) within the areas targeted by this assay, and inadequate number of viral copies(<138 copies/mL). A negative result must be combined with clinical observations, patient history, and epidemiological information. The expected result is Negative.  Fact Sheet for Patients:  EntrepreneurPulse.com.au  Fact Sheet for Healthcare Providers:  IncredibleEmployment.be  This test is no t yet approved or cleared by the [place] FDA and  has  been authorized for detection and/or diagnosis of SARS-CoV-2 by FDA under an Emergency Use Authorization (EUA). This EUA will remain  in effect (meaning this test can be used) for the duration of the COVID-19 declaration under Section 564(b)(1) of the Act, 21 U.S.C.section 360bbb-3(b)(1), unless the authorization is terminated  or revoked sooner.       Influenza A by PCR NEGATIVE NEGATIVE Final   Influenza B by PCR NEGATIVE NEGATIVE Final    Comment: (NOTE) The Xpert Xpress SARS-CoV-2/FLU/RSV plus assay is intended as an aid in the diagnosis of influenza from Nasopharyngeal swab specimens and should not be used as a sole basis for treatment. Nasal washings and aspirates are unacceptable for Xpert Xpress SARS-CoV-2/FLU/RSV testing.  Fact Sheet for Patients: EntrepreneurPulse.com.au  Fact Sheet for Healthcare Providers: IncredibleEmployment.be  This test is not yet approved or cleared by the [place] FDA and has been authorized for detection and/or diagnosis of SARS-CoV-2 by FDA under an Emergency Use Authorization (EUA). This EUA will remain in effect (meaning this test can be used) for the duration of the COVID-19 declaration under Section 564(b)(1) of the Act, 21 U.S.C. section 360bbb-3(b)(1), unless the authorization is terminated or revoked.  Performed at [facility], [address] [place], [place] [address]   Blood culture (routine x 2)     Status: None (Preliminary result)   Collection Time: 01/06/21  8:13 PM   Specimen: Right Antecubital; Blood  Result Value Ref Range Status   Specimen Description RIGHT ANTECUBITAL  Final   Special Requests   Final    Blood Culture adequate volume BOTTLES DRAWN AEROBIC AND ANAEROBIC   Culture   Final    NO GROWTH 3 DAYS Performed at [facility], [address], [place], [place] [address]    Report Status PENDING  Incomplete  Blood culture (routine x 2)     Status: None (Preliminary result)   Collection Time: 01/06/21  9:12 PM   Specimen: BLOOD  Result Value Ref Range Status   Specimen Description BLOOD LEFT ANTECUBITAL  Final   Special Requests   Final    BOTTLES DRAWN AEROBIC AND ANAEROBIC Blood Culture adequate volume   Culture   Final    NO GROWTH 3 DAYS Performed at [facility], [address], [place],  [address]    Report Status PENDING  Incomplete  Respiratory (~20 pathogens) panel by PCR     Status: None   Collection Time: 01/07/21  3:55 PM  Result Value Ref Range Status   Adenovirus NOT DETECTED NOT DETECTED Final   Coronavirus 229E NOT DETECTED NOT DETECTED Final    Comment: (NOTE) The Coronavirus on the Respiratory Panel, DOES NOT test for the novel  Coronavirus (2019 nCoV)    Coronavirus HKU1 NOT DETECTED NOT DETECTED Final   Coronavirus NL63 NOT DETECTED NOT DETECTED Final   Coronavirus OC43 NOT DETECTED NOT DETECTED Final   Metapneumovirus NOT DETECTED NOT DETECTED Final   Rhinovirus / Enterovirus NOT DETECTED NOT DETECTED Final   Influenza A NOT DETECTED NOT DETECTED Final   Influenza B NOT DETECTED NOT DETECTED Final   Parainfluenza Virus 1 NOT DETECTED NOT DETECTED Final   Parainfluenza Virus 2 NOT DETECTED NOT DETECTED Final   Parainfluenza Virus 3 NOT DETECTED NOT DETECTED Final   Parainfluenza Virus 4 NOT DETECTED NOT DETECTED Final   Respiratory Syncytial Virus  NOT DETECTED NOT DETECTED Final   Bordetella pertussis NOT DETECTED NOT DETECTED Final   Bordetella Parapertussis NOT DETECTED NOT DETECTED  Final   Chlamydophila pneumoniae NOT DETECTED NOT DETECTED Final   Mycoplasma pneumoniae NOT DETECTED NOT DETECTED Final    Comment: Performed at [facility], [place] [address] [place], [place] [address]     Labs: Basic Metabolic Panel: Recent Labs  Lab 01/06/21 1741 01/06/21 1911 01/07/21 0339 01/08/21 0555 01/09/21 0525  NA 136  --  135 132* 134*  K 2.8*  --  3.9 3.4* 3.9  CL 94*  --  98 96* 97*  CO2 31  --  25 27 29   GLUCOSE 102*  --  157* 135* 127*  BUN 20  --  17 20 28*  CREATININE 1.32*  --  1.16* 1.15* 1.32*  CALCIUM 8.5*  --  8.2* 8.0* 8.2*  MG  --  3.9* 2.7*  --  2.4  PHOS  --   --  1.9* 2.3* 4.0   Liver Function Tests: Recent Labs  Lab 01/07/21 0339  AST 26  ALT 21  ALKPHOS 50  BILITOT 0.7  PROT 6.5  ALBUMIN 3.4*   No results for input(s): LIPASE, AMYLASE in the last 168 hours. No results for input(s): AMMONIA in the last 168 hours. CBC: Recent Labs  Lab 01/06/21 1741 01/07/21 0339  WBC 13.1* 10.1  NEUTROABS 9.1*  --   HGB 11.9* 10.9*  HCT 37.7 35.3*  MCV 81.8 81.3  PLT 267 243   Cardiac Enzymes: No results for input(s): CKTOTAL, CKMB, CKMBINDEX, TROPONINI in the last 168 hours. BNP: BNP (last 3 results) Recent Labs    01/06/21 1741  BNP 50.0    ProBNP (last 3 results) No results for input(s): PROBNP in the last 8760 hours.  CBG: No results for input(s): GLUCAP in the last 168 hours.     Signed:  Barton Dubois MD.  [facility] 01/09/2021, 3:45 PM

## 2021-01-09 NOTE — Evaluation (Signed)
Physical Therapy Evaluation Patient Details Name: Glenda Terry MRN: [id number] DOB: 07/12/1940 Today's Date: 01/09/2021  History of Present Illness  Glenda Terry is a 80 y.o. female with medical history significant for asthma, anxiety, stage IIIb CKD, hypertension, hyperlipidemia, GERD, diastolic CHF, chronic pain syndrome, obesity who presents to the emergency department accompanied by daughter due to worsening shortness of breath.  Patient was not able to provide a history due to increased work of breathing, history was obtained from ED PA and daughter at bedside, per daughter, patient has been having increased shortness of breath for about a week and she has been having nasal congestion and increased nonproductive coughing fits which was worse at night.  She used home breathing treatments (inhaler and nebulizer) without any improvement, so she decided to go to the ED for further evaluation and management.  She denies fever, chills, blurry vision, abdominal pain, nausea or vomiting.  Clinical Impression  Patient functioning near baseline for functional mobility and gait. Patient completes all mobility without assist but with extra time to complete. She demonstrates good sitting tolerance and sitting balance. Patient requires use of RW for transfer to standing and for ambulation in room without loss of balance. She ends session seated in chair. Patient will benefit from continued physical therapy in hospital and recommended venue below to increase strength, balance, endurance for safe ADLs and gait.        Recommendations for follow up therapy are one component of a multi-disciplinary discharge planning process, led by the attending physician.  Recommendations may be updated based on patient status, additional functional criteria and insurance authorization.  Follow Up Recommendations Home health PT    Assistance Recommended at Discharge PRN  Functional Status Assessment    Equipment  Recommendations  None recommended by PT    Recommendations for Other Services       Precautions / Restrictions Precautions Precautions: Fall Restrictions Weight Bearing Restrictions: No      Mobility  Bed Mobility Overal bed mobility: Modified Independent                  Transfers Overall transfer level: Modified independent Equipment used: Rolling walker (2 wheels)                    Ambulation/Gait Ambulation/Gait assistance: Modified independent (Device/Increase time) Gait Distance (Feet): 30 Feet Assistive device: Rolling walker (2 wheels) Gait Pattern/deviations: Step-through pattern;Decreased stride length        Stairs            Wheelchair Mobility    Modified Rankin (Stroke Patients Only)       Balance Overall balance assessment: Needs assistance Sitting-balance support: No upper extremity supported Sitting balance-Leahy Scale: Good Sitting balance - Comments: seated EOB   Standing balance support: Bilateral upper extremity supported Standing balance-Leahy Scale: Fair Standing balance comment: fair/good with RW                             Pertinent Vitals/Pain Pain Assessment: Faces Faces Pain Scale: Hurts little more Pain Location: back Pain Descriptors / Indicators: Aching Pain Intervention(s): Limited activity within patient's tolerance;Monitored during session;Repositioned    Home Living Family/patient expects to be discharged to:: Private residence Living Arrangements: Alone Available Help at Discharge: Family;Available PRN/intermittently Type of Home: Apartment         Home Layout: One level Home Equipment: Conservation officer, nature (2 wheels);Cane - single point;Grab bars - tub/shower;Hand  held shower head;Toilet riser      Prior Function Prior Level of Function : Needs assist       Physical Assist : ADLs (physical)     Mobility Comments: ambulates with no AD/SPC in home, RW for longer distances ADLs  Comments: family assists PRN     Hand Dominance   Dominant Hand: Right    Extremity/Trunk Assessment   Upper Extremity Assessment Upper Extremity Assessment: Overall WFL for tasks assessed    Lower Extremity Assessment Lower Extremity Assessment: Overall WFL for tasks assessed;Generalized weakness    Cervical / Trunk Assessment Cervical / Trunk Assessment: Normal  Communication   Communication: No difficulties  Cognition Arousal/Alertness: Awake/alert Behavior During Therapy: WFL for tasks assessed/performed Overall Cognitive Status: Within Functional Limits for tasks assessed                                          General Comments      Exercises     Assessment/Plan    PT Assessment Patient needs continued PT services  PT Problem List Decreased strength;Decreased mobility;Decreased activity tolerance;Decreased balance       PT Treatment Interventions DME instruction;Therapeutic exercise;Gait training;Balance training;Stair training;Neuromuscular re-education;Functional mobility training;Therapeutic activities;Patient/family education    PT Goals (Current goals can be found in the Care Plan section)  Acute Rehab PT Goals Patient Stated Goal: return home PT Goal Formulation: With patient Time For Goal Achievement: 01/16/21 Potential to Achieve Goals: Good    Frequency Min 3X/week   Barriers to discharge        Co-evaluation               AM-PAC PT "6 Clicks" Mobility  Outcome Measure Help needed turning from your back to your side while in a flat bed without using bedrails?: None Help needed moving from lying on your back to sitting on the side of a flat bed without using bedrails?: None Help needed moving to and from a bed to a chair (including a wheelchair)?: A Little Help needed standing up from a chair using your arms (e.g., wheelchair or bedside chair)?: A Little Help needed to walk in hospital room?: A Little Help needed  climbing 3-5 steps with a railing? : A Lot 6 Click Score: 19    End of Session   Activity Tolerance: Patient tolerated treatment well Patient left: in chair;with call bell/phone within reach Nurse Communication: Mobility status PT Visit Diagnosis: Unsteadiness on feet (R26.81);Other abnormalities of gait and mobility (R26.89);Muscle weakness (generalized) (M62.81)    Time: 2703-5009 PT Time Calculation (min) (ACUTE ONLY): 25 min   Charges:   PT Evaluation $PT Eval Low Complexity: 1 Low PT Treatments $Therapeutic Activity: 8-22 mins        10:25 AM, 01/09/21 Mearl Latin PT, DPT Physical Therapist at [facility]

## 2021-01-09 NOTE — Plan of Care (Signed)
  Problem: Acute Rehab PT Goals(only PT should resolve) Goal: Pt Will Ambulate Outcome: Progressing Flowsheets (Taken 01/09/2021 1031) Pt will Ambulate:  50 feet  with rolling walker  with modified independence Goal: Pt/caregiver will Perform Home Exercise Program Outcome: Progressing Flowsheets (Taken 01/09/2021 1031) Pt/caregiver will Perform Home Exercise Program:  For increased strengthening  For improved balance  Independently  10:32 AM, 01/09/21 Mearl Latin PT, DPT Physical Therapist at [facility]

## 2021-01-09 NOTE — TOC Transition Note (Signed)
Transition of Care [facility]) - CM/SW Discharge Note   Patient Details  Name: Glenda Terry MRN: [id number] Date of Birth: [birth year]  Transition of Care [facility]) CM/SW Contact:  Boneta Lucks, RN Phone Number: 01/09/2021, 3:42 PM   Clinical Narrative:   PT is recommending HHPT.  Patient is discharging and agreeable if Insurance will pay for PT. TOC reached out to multiple agencies, due to staffing, the insurance no one could accept the referral. Patient is active with [facility], consult placed to assist with getting patient HHPT.    Final next level of care: Home/Self Care Barriers to Discharge: No [place] will accept this patient   Patient Goals and CMS Choice Patient states their goals for this hospitalization and ongoing recovery are:: return home CMS Medicare.gov Compare Post Acute Care list provided to:: Patient Choice offered to / list presented to : Patient  Discharge Placement              Patient and family notified of of transfer: 01/09/21  Discharge Plan and Services In-house Referral: Clinical Social Work              DME Arranged: N/A      Readmission Risk Interventions Readmission Risk Prevention Plan 01/08/2021 10/17/2019  Transportation Screening Complete Complete  PCP or Specialist Appt within 5-7 Days - Complete  Home Care Screening - Complete  Medication Review (RN CM) - Complete  Medication Review (RN Care Manager) Complete -  [place] or Home Care Consult Complete -  SW Recovery Care/Counseling Consult Complete -  Palliative Care Screening Not Applicable -  [place] Not Applicable -  Some recent data might be hidden

## 2021-01-11 LAB — CULTURE, BLOOD (ROUTINE X 2)
Culture: NO GROWTH
Culture: NO GROWTH
Special Requests: ADEQUATE
Special Requests: ADEQUATE

## 2021-01-12 DIAGNOSIS — M1990 Unspecified osteoarthritis, unspecified site: Secondary | ICD-10-CM | POA: Diagnosis not present

## 2021-01-12 DIAGNOSIS — E1165 Type 2 diabetes mellitus with hyperglycemia: Secondary | ICD-10-CM | POA: Diagnosis not present

## 2021-01-12 NOTE — Patient Outreach (Signed)
[facility]) Care Management  01/12/2021  Glenda Terry 08-Nov-1940 [id number]   HTA referral received sent to [email or web address] for follow up.  [facility] Management Assistant [phone]

## 2021-01-13 ENCOUNTER — Telehealth: Payer: Self-pay | Admitting: Internal Medicine

## 2021-01-13 NOTE — Telephone Encounter (Signed)
Called the patient's daughter back. She stated that the patient is still having bilateral leg edema which has become painful. She denies weight gain but was unable to provide a current weight. The patient does have mild shortness of breath. Recently discharged from the hospital on 10/28.   The patient is currently on torsemide 20 mg once daily. She has been advised to elevate her legs when possible and to limit her sodium intake.   She would like to know if she can be worked in sooner to be seen with Dr. Margaretann Loveless.

## 2021-01-13 NOTE — Telephone Encounter (Signed)
Called the patient due to the daughter not being on the dpr. The patient has given permission to talk to her daughter.  Per the MyChart message, an appointment has been saved for the patient at 3:40 with Dr. Margaretann Loveless. The daughter will see if she can  get her sister to bring the patient and will call back.

## 2021-01-13 NOTE — Telephone Encounter (Signed)
Pt c/o swelling: STAT is pt has developed SOB within 24 hours  How much weight have you gained and in what time span?  No weight gain, per patient's daughter  If swelling, where is the swelling located?  Ankles and legs  Are you currently taking a fluid pill?  Patient takes Torsemide 10 MG  Are you currently SOB?  No   Do you have a log of your daily weights (if so, list)?  No log available  Have you gained 3 pounds in a day or 5 pounds in a week?  No weight gain   Have you traveled recently?  No    Patient's daughter states she would also like to discuss CHF diagnosis from hospital.

## 2021-01-13 NOTE — Telephone Encounter (Signed)
Daughter is calling back stating they are not able to bring her in today. She is wanting to know if she can be worked in for another day this week as late as possible with either the PA or Dr. Margaretann Loveless.

## 2021-01-15 NOTE — Telephone Encounter (Signed)
Returned call to patient to schedule appointment with patient- she asked me to call her daughter whom she gives permission to receive information.   Spoke with patients daughter- scheduled patient an appointment at next available with Coletta Memos, NP at [facility] on 11/14. Made patients daughter aware of appointment time and date and she verbalized understanding. Advised her to call back with any issues, questions, or concerns in the mean time. Patients daughter verbalized understanding.

## 2021-01-16 DIAGNOSIS — R252 Cramp and spasm: Secondary | ICD-10-CM | POA: Diagnosis not present

## 2021-01-16 DIAGNOSIS — K449 Diaphragmatic hernia without obstruction or gangrene: Secondary | ICD-10-CM | POA: Diagnosis not present

## 2021-01-16 DIAGNOSIS — Z758 Other problems related to medical facilities and other health care: Secondary | ICD-10-CM | POA: Diagnosis not present

## 2021-01-16 DIAGNOSIS — J4541 Moderate persistent asthma with (acute) exacerbation: Secondary | ICD-10-CM | POA: Diagnosis not present

## 2021-01-16 DIAGNOSIS — I5032 Chronic diastolic (congestive) heart failure: Secondary | ICD-10-CM | POA: Diagnosis not present

## 2021-01-23 NOTE — Progress Notes (Signed)
Cardiology Office Note:    Date:  01/23/2021   ID:  Glenda Terry, DOB [birth year], MRN [id number]  PCP:  Celene Squibb, MD   [place] Providers Cardiologist:  Elouise Munroe, MD     Referring MD: Celene Squibb, MD   Follow-up for hypertension and lower extremity swelling.  History of Present Illness:    Glenda Terry is a 80 y.o. female with a hx of hypertension, CKD stage III, anxiety, COPD, Cushing syndrome, GERD, pulmonary eosinophilia, and HFpEF.  She is previously seen and evaluated with cardiac monitor for sinus tachycardia in 2014.  Her echocardiogram in 2017 was normal.  She was seen in 2018 and had no significant pathology.  She was seen in follow-up by Dr. Margaretann Loveless on 06/17/2020.  During that time she denied chest pain, chest pressure, PND, and orthopnea.  She denied fever, cough, chills, nausea, vomiting, diet Rea, dizziness, and lightheadedness.  She had been seen by nephrology and her olmesartan was held due to AKI.  She struggled with abdominal pain which she reported was from a chicken bone that become stuck in her stomach.  This caused her blood pressure to be elevated and on exam her blood pressure was elevated.  She was encouraged to take her diuretic medication daily due to her diastolic heart failure.  She was started on amlodipine.  Lower extremity compression stockings were recommended for her lower extremity swelling.  Recently admitted to the hospital on 01/06/2021 and discharged on 01/09/2021 with asthma exacerbation.  She responded well to IV steroids, nebulizer treatments, and antitussive drugs.  Her COVID-19 screening was negative.  Her chest x-ray showed bronchitis and she was also given azithromycin.  She presents to the clinic today for follow-up evaluation states she feels well.  She has resumed walking about 45 minutes/day.  She enjoys using her stand-up walker.  She follows a strict low-sodium diet.  She also presents today wearing lower  extremity support stockings.  She has been monitoring her blood pressure and weight.  They have both been stable.  We reviewed diastolic heart failure and signs and symptoms.  We reviewed her recent hospital admission for asthma exacerbation.  I will order a BMP today, continue her current diet, continue her current exercise regimen, and plan follow-up for 4 to 6 months.  We discussed the importance of contacting the office with a weight increase of 3 pounds overnight or 5 pounds in 1 week.  I have also asked her to continue to monitor her blood pressure with regards to her diastolic CHF.  Her and her daughter expressed understanding.  Today she denies chest pain, increased shortness of breath, increased lower extremity edema, fatigue, palpitations, melena, hematuria, hemoptysis, diaphoresis, weakness, presyncope, syncope, orthopnea, and PND.  Past Medical History:  Diagnosis Date   Anxiety    Asthma    Back pain, chronic    CKD (chronic kidney disease) stage 3, GFR 30-59 ml/min (HCC)    Compression fx, thoracic spine (HCC)    T - 11   COPD (chronic obstructive pulmonary disease) ([place]) 12/16/2015   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HCC)    Essential hypertension    GERD (gastroesophageal reflux disease)    HOH (hard of hearing)    Inflammatory arthritis    Iron deficiency anemia 01/2012   Pulmonary eosinophilia (HCC)    Spinal stenosis    Tubular adenoma 11/2012    Past Surgical History:  Procedure Laterality Date   ABDOMINAL HYSTERECTOMY  BACK SURGERY     CARPAL TUNNEL RELEASE Right 06/2012   CATARACT EXTRACTION W/PHACO Left 11/19/2014   Procedure: CATARACT EXTRACTION PHACO AND INTRAOCULAR LENS PLACEMENT ([place]);  Surgeon: Williams Che, MD;  Location: [facility];  Service: Ophthalmology;  Laterality: Left;  CDE: 4.77   CATARACT EXTRACTION W/PHACO Right 02/03/2015   Procedure: CATARACT EXTRACTION PHACO AND INTRAOCULAR LENS PLACEMENT ([facility]);  Surgeon: Williams Che, MD;  Location: [facility];  Service:  Ophthalmology;  Laterality: Right;  CDE: 4.54   COLONOSCOPY  06/19/2008   RMR: tortuous and elongated colon with scattered left-sided diverticula/colonic mucosa appeared entirely normal. Prior colonic ulcers had resolved.   COLONOSCOPY  12/2007   Dr. Gala Romney: Scattered diffuse sigmoid diverticula, 2 areas of ulceration at the hepatic flexure. Biopsies unremarkable.   COLONOSCOPY N/A 11/20/2012   next TCS 11/2017   COLONOSCOPY WITH PROPOFOL N/A 12/22/2017   Procedure: COLONOSCOPY WITH PROPOFOL;  Surgeon: Daneil Dolin, MD; diverticula in the sigmoid and descending colon and 2 tubular adenomas.  No recommendations to repeat.     DECOMPRESSIVE LUMBAR LAMINECTOMY LEVEL 2  04/20/2012   Procedure: DECOMPRESSIVE LUMBAR LAMINECTOMY LEVEL 2;  Surgeon: Tobi Bastos, MD;  Location: [facility];  Service: Orthopedics;  Laterality: Right;  Decompressive Lumbar Laminectomy of the L4 - L5 and L5 - S1 Complete/Laminectomy L5 on the Right (X-Ray)   ESOPHAGOGASTRODUODENOSCOPY  12/2007   Dr. 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 Small hiatal hernia. Slightly pale duodenal mucosa (biopsy unremarkable)   ESOPHAGOGASTRODUODENOSCOPY (EGD) WITH PROPOFOL N/A 01/20/2017   Dr. Gala Romney: Medium sized hiatal hernia empiric esophageal dilation for history of dysphagia   ESOPHAGOGASTRODUODENOSCOPY (EGD) WITH PROPOFOL N/A 04/08/2020   Surgeon: Eloise Harman, DO;  medium size hiatal hernia 3 to 4 cm chicken bone lodged in the gastric antrum just proximal to the pylorus removed with a snare with mild bleeding at the site of lodged bone.  Normal examined duodenum.   EYE SURGERY     BIL CATARACTS   FOREIGN BODY REMOVAL N/A 04/08/2020   Procedure: FOREIGN BODY REMOVAL;  Surgeon: Eloise Harman, DO;  [facility];  Service: Endoscopy;  Laterality: N/A;   INCISIONAL HERNIA REPAIR N/A 11/12/2020   Procedure: HERNIA REPAIR INCISIONAL;  Surgeon: Aviva Signs, MD;   Location: [facility];  Service: General;  Laterality: N/A;   IR KYPHO THORACIC WITH BONE BIOPSY  02/08/2017   IR RADIOLOGIST EVAL & MGMT  02/02/2017   JOINT REPLACEMENT     KNEE ARTHROSCOPY Right    MALONEY DILATION N/A 01/20/2017   Procedure: MALONEY DILATION;  Surgeon: Daneil Dolin, MD;  [facility];  Service: Endoscopy;  Laterality: N/A;   POLYPECTOMY  12/22/2017   Procedure: POLYPECTOMY;  Surgeon: Daneil Dolin, MD;  [facility];  Service: Endoscopy;;   REVERSE SHOULDER ARTHROPLASTY Right 05/24/2014   Procedure: RIGHT SHOULDER REVERSE ARTHROPLASTY;  Surgeon: Netta Cedars, MD;  Location: [place];  Service: Orthopedics;  Laterality: Right;   REVERSE SHOULDER ARTHROPLASTY Left 06/10/2017   Procedure: LEFT REVERSE SHOULDER ARTHROPLASTY;  Surgeon: Netta Cedars, MD;  Location: [place];  Service: Orthopedics;  Laterality: Left;   TOTAL KNEE ARTHROPLASTY  01/24/2012   Procedure: TOTAL KNEE ARTHROPLASTY;  Surgeon: Gearlean Alf, MD;  Location: [facility];  Service: Orthopedics;  Laterality: Right;   TOTAL KNEE ARTHROPLASTY Left 10/15/2019   Procedure: TOTAL KNEE ARTHROPLASTY;  Surgeon: Gaynelle Arabian, MD;  [facility];  Service: Orthopedics;  Laterality: Left;  79min   TUBAL LIGATION      Current Medications: No outpatient medications have been marked as taking for the 01/26/21 encounter (Appointment) with Deberah Pelton, NP.     Allergies:   Flexeril [cyclobenzaprine], Other, Keflex [cephalexin], Aspirin, Fentanyl, Adhesive [tape], and Chlorhexidine   Social History   Socioeconomic History   Marital status: Single    Spouse name: Not on file   Number of children: 3   Years of education: Not on file   Highest education level: Not on file  Occupational History   Not on file  Tobacco Use   Smoking status: Former    Packs/day: 2.00    Years: 40.00    Pack years: 80.00    Types: Cigarettes    Quit date: 01/19/1992    Years since quitting: 29.0   Smokeless  tobacco: Never  Vaping Use   Vaping Use: Never used  Substance and Sexual Activity   Alcohol use: No   Drug use: No   Sexual activity: Never  Other Topics Concern   Not on file  Social History Narrative   Not on file   Social Determinants of Health   Financial Resource Strain: Not on file  Food Insecurity: Not on file  Transportation Needs: Not on file  Physical Activity: Not on file  Stress: Not on file  Social Connections: Not on file     Family History: The patient's family history includes Breast cancer in her sister; Colon cancer in her sister; Heart attack in her mother; Hypertension in her father and mother; Prostate cancer in her father; Stroke in her mother.  ROS:   Please see the history of present illness.     All other systems reviewed and are negative.   Risk Assessment/Calculations:           Physical Exam:    VS:  There were no vitals taken for this visit.    Wt Readings from Last 3 Encounters:  01/06/21 182 lb (82.6 kg)  11/20/20 184 lb (83.5 kg)  11/12/20 182 lb 1.6 oz (82.6 kg)     GEN:  Well nourished, well developed in no acute distress HEENT: Normal NECK: No JVD; No carotid bruits LYMPHATICS: No lymphadenopathy CARDIAC: RRR, no murmurs, rubs, gallops, generalized bilateral lower extremity edema RESPIRATORY:  Clear to auscultation without rales, wheezing or rhonchi  ABDOMEN: Soft, non-tender, non-distended MUSCULOSKELETAL:  No edema; No deformity  SKIN: Warm and dry NEUROLOGIC:  Alert and oriented x 3 PSYCHIATRIC:  Normal affect    EKGs/Labs/Other Studies Reviewed:    The following studies were reviewed today:  Echocardiogram 08/01/2020  IMPRESSIONS     1. Left ventricular ejection fraction, by estimation, is 60 to 65%. The  left ventricle has normal function. The left ventricle has no regional  wall motion abnormalities. Left ventricular diastolic parameters are  consistent with Grade I diastolic  dysfunction (impaired  relaxation).   2. Right ventricular systolic function is normal. The right ventricular  size is normal.   3. The mitral valve is normal in structure. No evidence of mitral valve  regurgitation.   4. The aortic valve is tricuspid. Aortic valve regurgitation is not  visualized. No aortic stenosis is present.   5. The inferior vena cava is normal in size with greater than 50%  respiratory variability, suggesting right atrial pressure of 3 mmHg.   Comparison(s): A prior study was performed on 09/22/2015. No significant  change from prior study. Prior images reviewed  side by side.  EKG:  EKG is  ordered today.  The ekg ordered today demonstrates sinus rhythm with premature atrial complexes 90 bpm  Recent Labs: 01/06/2021: B Natriuretic Peptide 50.0 01/07/2021: ALT 21; Hemoglobin 10.9; Platelets 243 01/09/2021: BUN 28; Creatinine, Ser 1.32; Magnesium 2.4; Potassium 3.9; Sodium 134  Recent Lipid Panel No results found for: CHOL, TRIG, HDL, CHOLHDL, VLDL, LDLCALC, LDLDIRECT  ASSESSMENT & PLAN    Essential hypertension-BP today 130/84.  Well-controlled at home. Continue amlodipine Heart healthy low-sodium diet-salty 6 given Increase physical activity as tolerated Order BMP   Chronic diastolic CHF-bilateral lower extremity generalized edema.  Weight stable.  Echocardiogram 5/22 showed normal EF and G1 DD.  Details above. Continue torsemide Heart healthy low-sodium diet-salty 6 given Increase physical activity as tolerated   Bilateral lower extremity edema-generalized bilateral extremity nonpitting edema.  Stable weight.  Echocardiogram 08/01/2020 showed normal LVEF, G1 DD and no valvular abnormalities.  There was no change from her 09/22/2015 study.  Details above. Continue torsemide Heart healthy low-sodium diet-salty 6 given Increase physical activity as tolerated Lower extremity support stockings-[place] support stocking sheet given Elevate lower extremities when not active Daily  weights-contact office with a weight increase of 3 pounds overnight or 5 pounds in 1 week  Hyperlipidemia-LDL  Continue pravastatin Heart healthy low-sodium high-fiber diet Increase physical activity as tolerated Follows with PCP  Disposition: Follow-up with Dr. Margaretann Loveless 4-6 months.       Medication Adjustments/Labs and Tests Ordered: Current medicines are reviewed at length with the patient today.  Concerns regarding medicines are outlined above.  No orders of the defined types were placed in this encounter.  No orders of the defined types were placed in this encounter.   There are no Patient Instructions on file for this visit.   Signed, Deberah Pelton, NP  01/23/2021 7:19 AM      Notice: This dictation was prepared with Dragon dictation along with smaller phrase technology. Any transcriptional errors that result from this process are unintentional and may not be corrected upon review.  I spent 15 minutes examining this patient, reviewing medications, and using patient centered shared decision making involving her cardiac care.  Prior to her visit I spent greater than 20 minutes reviewing her past medical history,  medications, and prior cardiac tests.

## 2021-01-26 ENCOUNTER — Encounter (HOSPITAL_BASED_OUTPATIENT_CLINIC_OR_DEPARTMENT_OTHER): Payer: Self-pay | Admitting: General Practice

## 2021-01-26 ENCOUNTER — Ambulatory Visit (HOSPITAL_BASED_OUTPATIENT_CLINIC_OR_DEPARTMENT_OTHER): Payer: PPO | Admitting: General Practice

## 2021-01-26 ENCOUNTER — Other Ambulatory Visit: Payer: Self-pay

## 2021-01-26 VITALS — BP 130/84 | HR 90 | Ht 60.0 in | Wt 191.3 lb

## 2021-01-26 DIAGNOSIS — E782 Mixed hyperlipidemia: Secondary | ICD-10-CM | POA: Diagnosis not present

## 2021-01-26 DIAGNOSIS — I10 Essential (primary) hypertension: Secondary | ICD-10-CM | POA: Diagnosis not present

## 2021-01-26 DIAGNOSIS — M7989 Other specified soft tissue disorders: Secondary | ICD-10-CM | POA: Diagnosis not present

## 2021-01-26 NOTE — Patient Instructions (Signed)
Medication Instructions:  Your Physician recommend you continue on your current medication as directed.    *If you need a refill on your cardiac medications before your next appointment, please call your pharmacy*   Lab Work: Your physician recommends that you return for lab work in:   Please report to the third floor for labs today- BMP  If you have labs (blood work) drawn today and your tests are completely normal, you will receive your results only by: MyChart Message (if you have MyChart) OR A paper copy in the mail If you have any lab test that is abnormal or we need to change your treatment, we will call you to review the results.   Testing/Procedures: None today   Follow-Up: At [facility], you and your health needs are our priority.  As part of our continuing mission to provide you with exceptional heart care, we have created designated Provider Care Teams.  These Care Teams include your primary Cardiologist (physician) and Advanced Practice Providers (APPs -  Physician Assistants and Nurse Practitioners) who all work together to provide you with the care you need, when you need it.  We recommend signing up for the patient portal called "MyChart".  Sign up information is provided on this After Visit Summary.  MyChart is used to connect with patients for Virtual Visits (Telemedicine).  Patients are able to view lab/test results, encounter notes, upcoming appointments, etc.  Non-urgent messages can be sent to your provider as well.   To learn more about what you can do with MyChart, go to [email or web address].    Your next appointment:   4-6 month(s)  The format for your next appointment:   In Person  Provider:   Elouise Munroe, MD  or Coletta Memos, FNP    Other Instructions Please continue your physical activity and daily walking, refer to the salty six diet sheet  If you gain 3lbs overnight or 5 pounds in a week please give our office a call

## 2021-01-27 LAB — BASIC METABOLIC PANEL
BUN/Creatinine Ratio: 15 (ref 12–28)
BUN: 24 mg/dL (ref 8–27)
CO2: 20 mmol/L (ref 20–29)
Calcium: 10.7 mg/dL — ABNORMAL HIGH (ref 8.7–10.3)
Chloride: 96 mmol/L (ref 96–106)
Creatinine, Ser: 1.58 mg/dL — ABNORMAL HIGH (ref 0.57–1.00)
Glucose: 89 mg/dL (ref 70–99)
Potassium: 4.1 mmol/L (ref 3.5–5.2)
Sodium: 139 mmol/L (ref 134–144)
eGFR: 33 mL/min/{1.73_m2} — ABNORMAL LOW (ref >59)

## 2021-01-29 ENCOUNTER — Telehealth: Payer: Self-pay | Admitting: General Practice

## 2021-01-29 DIAGNOSIS — I5032 Chronic diastolic (congestive) heart failure: Secondary | ICD-10-CM

## 2021-01-29 DIAGNOSIS — R7989 Other specified abnormal findings of blood chemistry: Secondary | ICD-10-CM

## 2021-01-29 NOTE — Telephone Encounter (Signed)
Patient needs her most recent lab results to be faxed to her PCP at [phone] Dr. [name] Crane.  She would also like to know the results to her blood work.

## 2021-01-29 NOTE — Telephone Encounter (Signed)
Per patient request  labs were route to Dr Allyn Kenner. Patient's primary.  RN informed patient of the below recommendation   Glenda Pelton, NP  01/28/2021  7:10 AM EST Back to Top    Please contact [name] and let her know that her lab work from 01/26/2021 is been reviewed.  Her creatinine is slightly elevated at 1.58.  We will change her torsemide to 40 mg only on Monday and Friday.  She will continue to take 20 mg on all the other days of the week.  We will repeat her BMP in 2-3 weeks.  Please ask her to continue to weigh her self daily and contact the office with a weight increase of 3 pounds overnight or 5 pounds in 1 week.  Thank you.    Patient  verbalized understanding . She states she will be going to  have lab work done for Dr [name] Crane 02/09/21. She states  that she will do labs for our office as well a BMP.  Either at Dr halls office or [facility] in [place].  She states she has been weighing herself daily    This week weight Monday in the office 191.2 lbs , her scales  Tuesday 186.8 lb Wednesday 186 lbs Thursday 184 lbs. Mailed lab slip

## 2021-01-29 NOTE — Progress Notes (Signed)
See telephone note from Ivin Booty, [place]

## 2021-01-30 DIAGNOSIS — Z515 Encounter for palliative care: Secondary | ICD-10-CM | POA: Diagnosis not present

## 2021-01-30 DIAGNOSIS — Z6837 Body mass index (BMI) 37.0-37.9, adult: Secondary | ICD-10-CM | POA: Diagnosis not present

## 2021-01-30 DIAGNOSIS — I509 Heart failure, unspecified: Secondary | ICD-10-CM | POA: Diagnosis not present

## 2021-01-30 DIAGNOSIS — J45909 Unspecified asthma, uncomplicated: Secondary | ICD-10-CM | POA: Diagnosis not present

## 2021-02-09 DIAGNOSIS — I10 Essential (primary) hypertension: Secondary | ICD-10-CM | POA: Diagnosis not present

## 2021-02-09 DIAGNOSIS — D509 Iron deficiency anemia, unspecified: Secondary | ICD-10-CM | POA: Diagnosis not present

## 2021-02-09 DIAGNOSIS — E1122 Type 2 diabetes mellitus with diabetic chronic kidney disease: Secondary | ICD-10-CM | POA: Diagnosis not present

## 2021-02-12 DIAGNOSIS — Z87891 Personal history of nicotine dependence: Secondary | ICD-10-CM | POA: Diagnosis not present

## 2021-02-12 DIAGNOSIS — J4551 Severe persistent asthma with (acute) exacerbation: Secondary | ICD-10-CM | POA: Diagnosis not present

## 2021-02-12 DIAGNOSIS — Z79899 Other long term (current) drug therapy: Secondary | ICD-10-CM | POA: Diagnosis not present

## 2021-02-12 DIAGNOSIS — J449 Chronic obstructive pulmonary disease, unspecified: Secondary | ICD-10-CM | POA: Diagnosis not present

## 2021-02-13 DIAGNOSIS — I10 Essential (primary) hypertension: Secondary | ICD-10-CM | POA: Diagnosis not present

## 2021-02-13 DIAGNOSIS — E1122 Type 2 diabetes mellitus with diabetic chronic kidney disease: Secondary | ICD-10-CM | POA: Diagnosis not present

## 2021-02-13 DIAGNOSIS — F411 Generalized anxiety disorder: Secondary | ICD-10-CM | POA: Diagnosis not present

## 2021-02-13 DIAGNOSIS — Z0001 Encounter for general adult medical examination with abnormal findings: Secondary | ICD-10-CM | POA: Diagnosis not present

## 2021-02-13 DIAGNOSIS — Z87311 Personal history of (healed) other pathological fracture: Secondary | ICD-10-CM | POA: Diagnosis not present

## 2021-02-13 DIAGNOSIS — J449 Chronic obstructive pulmonary disease, unspecified: Secondary | ICD-10-CM | POA: Diagnosis not present

## 2021-02-13 DIAGNOSIS — E782 Mixed hyperlipidemia: Secondary | ICD-10-CM | POA: Diagnosis not present

## 2021-02-13 DIAGNOSIS — D509 Iron deficiency anemia, unspecified: Secondary | ICD-10-CM | POA: Diagnosis not present

## 2021-02-13 DIAGNOSIS — M8580 Other specified disorders of bone density and structure, unspecified site: Secondary | ICD-10-CM | POA: Diagnosis not present

## 2021-02-13 DIAGNOSIS — M1990 Unspecified osteoarthritis, unspecified site: Secondary | ICD-10-CM | POA: Diagnosis not present

## 2021-02-18 DIAGNOSIS — S81811A Laceration without foreign body, right lower leg, initial encounter: Secondary | ICD-10-CM | POA: Diagnosis not present

## 2021-02-18 DIAGNOSIS — M25571 Pain in right ankle and joints of right foot: Secondary | ICD-10-CM | POA: Diagnosis not present

## 2021-02-18 DIAGNOSIS — M19042 Primary osteoarthritis, left hand: Secondary | ICD-10-CM | POA: Diagnosis not present

## 2021-02-18 DIAGNOSIS — M659 Synovitis and tenosynovitis, unspecified: Secondary | ICD-10-CM | POA: Diagnosis not present

## 2021-02-18 DIAGNOSIS — G8929 Other chronic pain: Secondary | ICD-10-CM | POA: Diagnosis not present

## 2021-02-21 DIAGNOSIS — I700 Atherosclerosis of aorta: Secondary | ICD-10-CM | POA: Diagnosis not present

## 2021-02-21 DIAGNOSIS — R109 Unspecified abdominal pain: Secondary | ICD-10-CM | POA: Diagnosis not present

## 2021-02-21 DIAGNOSIS — M549 Dorsalgia, unspecified: Secondary | ICD-10-CM | POA: Diagnosis not present

## 2021-02-21 DIAGNOSIS — Z602 Problems related to living alone: Secondary | ICD-10-CM | POA: Diagnosis not present

## 2021-02-21 DIAGNOSIS — M5442 Lumbago with sciatica, left side: Secondary | ICD-10-CM | POA: Diagnosis not present

## 2021-02-21 DIAGNOSIS — S39012A Strain of muscle, fascia and tendon of lower back, initial encounter: Secondary | ICD-10-CM | POA: Diagnosis not present

## 2021-02-21 DIAGNOSIS — M545 Low back pain, unspecified: Secondary | ICD-10-CM | POA: Diagnosis not present

## 2021-02-21 DIAGNOSIS — M79606 Pain in leg, unspecified: Secondary | ICD-10-CM | POA: Diagnosis not present

## 2021-02-21 DIAGNOSIS — M5432 Sciatica, left side: Secondary | ICD-10-CM | POA: Diagnosis not present

## 2021-02-21 DIAGNOSIS — K449 Diaphragmatic hernia without obstruction or gangrene: Secondary | ICD-10-CM | POA: Diagnosis not present

## 2021-02-21 DIAGNOSIS — X58XXXA Exposure to other specified factors, initial encounter: Secondary | ICD-10-CM | POA: Diagnosis not present

## 2021-02-21 DIAGNOSIS — Z90710 Acquired absence of both cervix and uterus: Secondary | ICD-10-CM | POA: Diagnosis not present

## 2021-02-21 DIAGNOSIS — I10 Essential (primary) hypertension: Secondary | ICD-10-CM | POA: Diagnosis not present

## 2021-02-24 ENCOUNTER — Ambulatory Visit: Payer: PPO | Admitting: Student

## 2021-02-25 DIAGNOSIS — N170 Acute kidney failure with tubular necrosis: Secondary | ICD-10-CM | POA: Diagnosis not present

## 2021-02-25 DIAGNOSIS — N1832 Chronic kidney disease, stage 3b: Secondary | ICD-10-CM | POA: Diagnosis not present

## 2021-02-25 DIAGNOSIS — I5032 Chronic diastolic (congestive) heart failure: Secondary | ICD-10-CM | POA: Diagnosis not present

## 2021-02-25 DIAGNOSIS — I129 Hypertensive chronic kidney disease with stage 1 through stage 4 chronic kidney disease, or unspecified chronic kidney disease: Secondary | ICD-10-CM | POA: Diagnosis not present

## 2021-02-25 DIAGNOSIS — E6609 Other obesity due to excess calories: Secondary | ICD-10-CM | POA: Diagnosis not present

## 2021-02-26 ENCOUNTER — Ambulatory Visit: Payer: PPO | Admitting: Student

## 2021-02-27 DIAGNOSIS — D7219 Other eosinophilia: Secondary | ICD-10-CM | POA: Diagnosis not present

## 2021-02-27 DIAGNOSIS — J4550 Severe persistent asthma, uncomplicated: Secondary | ICD-10-CM | POA: Diagnosis not present

## 2021-03-04 ENCOUNTER — Encounter (HOSPITAL_COMMUNITY)
Admission: RE | Admit: 2021-03-04 | Discharge: 2021-03-04 | Disposition: A | Discharge status: Home / Self Care | Payer: PPO | Source: Ambulatory Visit | Attending: Internal Medicine | Admitting: Internal Medicine

## 2021-03-04 DIAGNOSIS — Z01812 Encounter for preprocedural laboratory examination: Secondary | ICD-10-CM | POA: Diagnosis not present

## 2021-03-04 MED ORDER — DENOSUMAB 60 MG/ML ~~LOC~~ SOSY
60.0000 mg | PREFILLED_SYRINGE | Freq: Once | SUBCUTANEOUS | Status: AC
  Administered 2021-03-04: 10:00:00 60 mg via SUBCUTANEOUS
  Filled 2021-03-04: qty 1

## 2021-03-05 DIAGNOSIS — M4727 Other spondylosis with radiculopathy, lumbosacral region: Secondary | ICD-10-CM | POA: Diagnosis not present

## 2021-03-05 DIAGNOSIS — G8929 Other chronic pain: Secondary | ICD-10-CM | POA: Diagnosis not present

## 2021-03-05 DIAGNOSIS — M5442 Lumbago with sciatica, left side: Secondary | ICD-10-CM | POA: Diagnosis not present

## 2021-03-05 DIAGNOSIS — M25552 Pain in left hip: Secondary | ICD-10-CM | POA: Diagnosis not present

## 2021-03-05 DIAGNOSIS — T148XXA Other injury of unspecified body region, initial encounter: Secondary | ICD-10-CM | POA: Diagnosis not present

## 2021-03-06 DIAGNOSIS — L97219 Non-pressure chronic ulcer of right calf with unspecified severity: Secondary | ICD-10-CM | POA: Diagnosis not present

## 2021-03-10 DIAGNOSIS — M48061 Spinal stenosis, lumbar region without neurogenic claudication: Secondary | ICD-10-CM | POA: Diagnosis not present

## 2021-03-10 DIAGNOSIS — G894 Chronic pain syndrome: Secondary | ICD-10-CM | POA: Diagnosis not present

## 2021-03-12 ENCOUNTER — Ambulatory Visit (HOSPITAL_COMMUNITY): Payer: PPO | Attending: Internal Medicine | Admitting: Physical Therapy

## 2021-03-12 ENCOUNTER — Encounter (HOSPITAL_COMMUNITY): Payer: Self-pay | Admitting: Physical Therapy

## 2021-03-12 ENCOUNTER — Other Ambulatory Visit: Payer: Self-pay

## 2021-03-12 DIAGNOSIS — W228XXD Striking against or struck by other objects, subsequent encounter: Secondary | ICD-10-CM | POA: Diagnosis not present

## 2021-03-12 DIAGNOSIS — S81801D Unspecified open wound, right lower leg, subsequent encounter: Secondary | ICD-10-CM | POA: Diagnosis not present

## 2021-03-12 DIAGNOSIS — L089 Local infection of the skin and subcutaneous tissue, unspecified: Secondary | ICD-10-CM | POA: Diagnosis not present

## 2021-03-12 DIAGNOSIS — M79661 Pain in right lower leg: Secondary | ICD-10-CM | POA: Diagnosis not present

## 2021-03-12 DIAGNOSIS — S80851A Superficial foreign body, right lower leg, initial encounter: Secondary | ICD-10-CM | POA: Diagnosis not present

## 2021-03-12 NOTE — Therapy (Signed)
[place], [place], [address] Phone: [phone]   Fax:  [phone]  Wound Care Evaluation  Patient Details  Name: Glenda Terry MRN: [id number] Date of Birth: 06-28-1940 Referring Provider (PT): Allyn Kenner   Encounter Date: 03/12/2021   PT End of Session - 03/12/21 1242     Visit Number 1    Number of Visits 8    Date for PT Re-Evaluation 04/11/21    Authorization Type [employer]    PT Start Time 1130    PT Stop Time 1205    PT Time Calculation (min) 35 min    Activity Tolerance Patient tolerated treatment well    Behavior During Therapy [facility] for tasks assessed/performed             Past Medical History:  Diagnosis Date   Anxiety    Asthma    Back pain, chronic    CKD (chronic kidney disease) stage 3, GFR 30-59 ml/min (HCC)    Compression fx, thoracic spine ([place])    T - 11   COPD (chronic obstructive pulmonary disease) ([place]) 12/16/2015   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[place])    Essential hypertension    GERD (gastroesophageal reflux disease)    HOH (hard of hearing)    Inflammatory arthritis    Iron deficiency anemia 01/2012   Pulmonary eosinophilia ([place])    Spinal stenosis    Tubular adenoma 11/2012    Past Surgical History:  Procedure Laterality Date   ABDOMINAL HYSTERECTOMY     BACK SURGERY     CARPAL TUNNEL RELEASE Right 06/2012   CATARACT EXTRACTION W/PHACO Left 11/19/2014   Procedure: CATARACT EXTRACTION PHACO AND INTRAOCULAR LENS PLACEMENT ([place]);  Surgeon: Williams Che, MD;  Location: [facility];  Service: Ophthalmology;  Laterality: Left;  CDE: 4.77   CATARACT EXTRACTION W/PHACO Right 02/03/2015   Procedure: CATARACT EXTRACTION PHACO AND INTRAOCULAR LENS PLACEMENT ([facility]);  Surgeon: Williams Che, MD;  Location: [facility];  Service: Ophthalmology;  Laterality: Right;  CDE: 4.54   COLONOSCOPY  06/19/2008   RMR: tortuous and elongated colon with scattered left-sided diverticula/colonic  mucosa appeared entirely normal. Prior colonic ulcers had resolved.   COLONOSCOPY  12/2007   Dr. Gala Romney: Scattered diffuse sigmoid diverticula, 2 areas of ulceration at the hepatic flexure. Biopsies unremarkable.   COLONOSCOPY N/A 11/20/2012   next TCS 11/2017   COLONOSCOPY WITH PROPOFOL N/A 12/22/2017   Procedure: COLONOSCOPY WITH PROPOFOL;  Surgeon: Daneil Dolin, MD; diverticula in the sigmoid and descending colon and 2 tubular adenomas.  No recommendations to repeat.     DECOMPRESSIVE LUMBAR LAMINECTOMY LEVEL 2  04/20/2012   Procedure: DECOMPRESSIVE LUMBAR LAMINECTOMY LEVEL 2;  Surgeon: Tobi Bastos, MD;  Location: [facility];  Service: Orthopedics;  Laterality: Right;  Decompressive Lumbar Laminectomy of the L4 - L5 and L5 - S1 Complete/Laminectomy L5 on the Right (X-Ray)   ESOPHAGOGASTRODUODENOSCOPY  12/2007   Dr. 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 Small hiatal hernia. Slightly pale duodenal mucosa (biopsy unremarkable)   ESOPHAGOGASTRODUODENOSCOPY (EGD) WITH PROPOFOL N/A 01/20/2017   Dr. Gala Romney: Medium sized hiatal hernia empiric esophageal dilation for history of dysphagia   ESOPHAGOGASTRODUODENOSCOPY (EGD) WITH PROPOFOL N/A 04/08/2020   Surgeon: Eloise Harman, DO;  medium size hiatal hernia 3 to 4 cm chicken bone lodged in the gastric antrum just proximal to the pylorus removed with a snare with mild bleeding at the site of lodged bone.  Normal examined duodenum.   EYE SURGERY     BIL CATARACTS   FOREIGN BODY REMOVAL N/A 04/08/2020   Procedure: FOREIGN BODY REMOVAL;  Surgeon: Eloise Harman, DO;  [facility];  Service: Endoscopy;  Laterality: N/A;   INCISIONAL HERNIA REPAIR N/A 11/12/2020   Procedure: HERNIA REPAIR INCISIONAL;  Surgeon: Aviva Signs, MD;  Location: [facility];  Service: General;  Laterality: N/A;   IR KYPHO THORACIC WITH BONE BIOPSY  02/08/2017   IR RADIOLOGIST EVAL & MGMT  02/02/2017   JOINT  REPLACEMENT     KNEE ARTHROSCOPY Right    MALONEY DILATION N/A 01/20/2017   Procedure: MALONEY DILATION;  Surgeon: Daneil Dolin, MD;  [facility]: [facility];  Service: Endoscopy;  Laterality: N/A;   POLYPECTOMY  12/22/2017   Procedure: POLYPECTOMY;  Surgeon: Daneil Dolin, MD;  [facility];  Service: Endoscopy;;   REVERSE SHOULDER ARTHROPLASTY Right 05/24/2014   Procedure: RIGHT SHOULDER REVERSE ARTHROPLASTY;  Surgeon: Netta Cedars, MD;  Location: [place];  Service: Orthopedics;  Laterality: Right;   REVERSE SHOULDER ARTHROPLASTY Left 06/10/2017   Procedure: LEFT REVERSE SHOULDER ARTHROPLASTY;  Surgeon: Netta Cedars, MD;  Location: [place];  Service: Orthopedics;  Laterality: Left;   TOTAL KNEE ARTHROPLASTY  01/24/2012   Procedure: TOTAL KNEE ARTHROPLASTY;  Surgeon: Gearlean Alf, MD;  Location: [facility];  Service: Orthopedics;  Laterality: Right;   TOTAL KNEE ARTHROPLASTY Left 10/15/2019   Procedure: TOTAL KNEE ARTHROPLASTY;  Surgeon: Gaynelle Arabian, MD;  Location: [facility];  Service: Orthopedics;  Laterality: Left;  73min   TUBAL LIGATION      There were no vitals filed for this visit.     [facility] PT Assessment - 03/12/21 0001       Assessment   Medical Diagnosis Non healing wound    Referring Provider (PT) Allyn Kenner    Onset Date/Surgical Date 02/14/21    Prior Therapy none      Precautions   Precautions --   cellulitis     Balance Screen   Has the patient fallen in the past 6 months No    Has the patient had a decrease in activity level because of a fear of falling?  No    Is the patient reluctant to leave their home because of a fear of falling?  No             Wound Therapy - 03/12/21 0001     Subjective Pt states on 02/14/2021 she was moving some furniture and slipped cutting open her leg.  She has been on antibiotics but the wound will not heal    Patient and Family Stated Goals decreased pain,wound to heal    Date of Onset 02/14/21    Prior Treatments  self care; antibiotics    Pain Scale 0-10    Pain Score 5     Pain Type Acute pain    Pain Location Leg    Pain Orientation Right;Anterior;Lateral    Pain Descriptors / Indicators Aching;Throbbing    Pain Onset With Activity    Patients Stated Pain Goal 0    Pain Intervention(s) Emotional support    Evaluation and Treatment Procedures Explained to Patient/Family Yes    Evaluation and Treatment Procedures agreed to    Wound Properties Date First Assessed: 03/12/21 Time First Assessed: 3235 Wound Type: Puncture Location: Pretibial Location Orientation: Proximal;Right Wound Description (Comments): Rt lateral LE Present on Admission: Yes   Dressing Type Gauze (Comment);Compression wrap  Dressing Changed Changed    Dressing Change Frequency PRN    Site / Wound Assessment Yellow    % Wound base Red or Granulating 0%   after debridement 25% granualtion   % Wound base Yellow/Fibrinous Exudate 100%    Peri-wound Assessment Intact    Wound Length (cm) 3 cm    Wound Width (cm) 2.2 cm    Wound Depth (cm) 0.4 cm    Wound Volume (cm^3) 2.64 cm^3    Wound Surface Area (cm^2) 6.6 cm^2    Drainage Amount Moderate    Drainage Description Serous    Treatment Cleansed;Debridement (Selective)    Selective Debridement - Location entire wound bed    Selective Debridement - Tools Used Forceps;Scalpel;Scissors    Selective Debridement - Tissue Removed slough    Wound Therapy - Clinical Statement Ms. Denise is a 80yo female who had a leg injury on 02/14/2021 which will not heal.  She has been cleansing and bandaging her leg daily and recieved a bout of antibiotic, however, there still seems to be no healing therefore her MD referred her to skilled PT.  Ms. Ficek will benefit from skilled PT for debridement and correct dressings to ensure that the wound maintains a healing environment.    Wound Therapy - Functional Problem List difficulty with ambulation as well as dressing    Hydrotherapy Plan  Debridement;Dressing change;Patient/family education    Wound Therapy - Frequency 2X / week   x 4 weeks   Wound Therapy - Current Recommendations PT    Wound Plan see pt 2x a week for 4 weeks for debridement and dressing change    Dressing  vaseline around the wound, honey alginate to wound bed, 4x4 , kerlix and compression garment.                   Objective measurements completed on examination: See above findings.             PT Education - 03/12/21 1242     Education Details keep dressing dry, do not remove unless it becomes painful    Person(s) Educated Patient;Child(ren)    Methods Explanation    Comprehension Verbalized understanding              PT Short Term Goals - 03/12/21 1245       PT SHORT TERM GOAL #1   Title Wound base on Rt LE to be 100% granulated    Time 2    Period Weeks    Status New    Target Date 03/26/21      PT SHORT TERM GOAL #2   Title PT pain no greater than a 2/10 to allow pt to be able to tolerate walking easier.    Time 2    Period Weeks    Status New               PT Long Term Goals - 03/12/21 1246       PT LONG TERM GOAL #1   Title Pt wound to be healed so there is no risk of cellulitis    Time 4    Period Weeks    Status New    Target Date 04/09/21                  Plan - 03/12/21 1243     Clinical Impression Statement see above    Personal Factors and Comorbidities Age;Time since onset of injury/illness/exacerbation    Examination-Activity Limitations  Bathing;Dressing;Locomotion Level    Examination-Participation Restrictions Cleaning    Stability/Clinical Decision Making Evolving/Moderate complexity    Clinical Decision Making Moderate    Rehab Potential Good    PT Frequency 2x / week    PT Duration 4 weeks    PT Treatment/Interventions Other (comment)   sharp debridement, dressing change and compression   PT Next Visit Plan assess how honey alginate did for pt.  Continue with wound  care.             Patient will benefit from skilled therapeutic intervention in order to improve the following deficits and impairments:  Decreased skin integrity, Increased edema, Pain  Visit Diagnosis: Superficial foreign body of right lower extremity without major open wound with infection, initial encounter  Pain in right lower leg    Problem List Patient Active Problem List   Diagnosis Date Noted   Hypokalemia 01/07/2021   Hypermagnesemia 01/07/2021   Dehydration 01/07/2021   Hiatal hernia 01/07/2021   Leukocytosis 01/07/2021   Mixed hyperlipidemia 01/07/2021   Chronic diastolic CHF (congestive heart failure) ([place]) 01/07/2021   Chronic pain syndrome 01/07/2021   Obesity (BMI 35.0-39.9 without comorbidity) 01/07/2021   Abdominal pain 11/11/2020   Incisional hernia, without obstruction or gangrene    Nausea with vomiting 01/14/2020   Chronic bilateral low back pain 10/25/2019   Chronic constipation 10/22/2019   Bilateral lower extremity edema 10/22/2019   Chronic anemia 10/22/2019   Post-menopausal osteoporosis 10/22/2019   Tachycardia 10/18/2019   Primary osteoarthritis of knee 10/16/2019   Primary osteoarthritis of left knee 10/15/2019   Left lower quadrant abdominal pain 02/16/2019   Hx of adenomatous colonic polyps 11/28/2017   S/P shoulder replacement, 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   Eosinophil count raised 06/09/2016   Severe persistent asthma 06/07/2016   COPD (chronic obstructive pulmonary disease) ([place]) 12/16/2015   Asthma exacerbation 09/20/2015   Acute respiratory failure ([place]) 09/20/2015   CKD (chronic kidney disease) stage 3, GFR 30-59 ml/min (HCC) 09/20/2015   Hemorrhoids, internal 08/21/2015   S/P shoulder replacement 05/24/2014   Microcytic anemia 11/01/2012   Family hx of colon cancer 11/01/2012   Spinal stenosis of lumbar region without neurogenic claudication 04/20/2012   Herniated lumbar intervertebral disc 04/20/2012   Difficulty in walking(719.7)  03/13/2012   Stiffness of joint, not elsewhere classified, lower leg 03/13/2012   Weakness of right leg 03/13/2012   OA (osteoarthritis) of knee 01/24/2012   Anxiety 02/09/2011   HTN (hypertension) 02/09/2011   Arthritis 02/09/2011   GERD (gastroesophageal reflux disease) 02/09/2011   Rheumatoid arthritis ([place]) 02/09/2011   Rayetta Humphrey, PT CLT [phone] 03/12/2021, 12:48 PM  [facility] [address] [place], [place], [address] Phone: [phone]   Fax:  [phone]  Name: Glenda Terry MRN: [id number] Date of Birth: [birth year]

## 2021-03-18 ENCOUNTER — Other Ambulatory Visit: Payer: Self-pay

## 2021-03-18 ENCOUNTER — Encounter (HOSPITAL_COMMUNITY): Payer: Self-pay

## 2021-03-18 ENCOUNTER — Ambulatory Visit (HOSPITAL_COMMUNITY): Payer: PPO | Attending: Internal Medicine

## 2021-03-18 DIAGNOSIS — M79661 Pain in right lower leg: Secondary | ICD-10-CM | POA: Diagnosis not present

## 2021-03-18 DIAGNOSIS — L089 Local infection of the skin and subcutaneous tissue, unspecified: Secondary | ICD-10-CM | POA: Diagnosis not present

## 2021-03-18 DIAGNOSIS — S80851A Superficial foreign body, right lower leg, initial encounter: Secondary | ICD-10-CM | POA: Diagnosis not present

## 2021-03-18 NOTE — Therapy (Signed)
[place], [place], [address] Phone: [phone]   Fax:  [phone]  Wound Care Therapy  Patient Details  Name: Glenda Terry MRN: [id number] Date of Birth: [birth year] Referring Provider (PT): Allyn Kenner   Encounter Date: 03/18/2021   PT End of Session - 03/18/21 1004     Visit Number 2    Number of Visits 8    Date for PT Re-Evaluation 04/11/21    Authorization Type [employer]    PT Start Time 0920    PT Stop Time 0950    PT Time Calculation (min) 30 min    Activity Tolerance Patient tolerated treatment well    Behavior During Therapy [facility] for tasks assessed/performed             Past Medical History:  Diagnosis Date   Anxiety    Asthma    Back pain, chronic    CKD (chronic kidney disease) stage 3, GFR 30-59 ml/min (HCC)    Compression fx, thoracic spine ([place])    T - 11   COPD (chronic obstructive pulmonary disease) ([place]) 12/16/2015   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[place])    Essential hypertension    GERD (gastroesophageal reflux disease)    HOH (hard of hearing)    Inflammatory arthritis    Iron deficiency anemia 01/2012   Pulmonary eosinophilia ([place])    Spinal stenosis    Tubular adenoma 11/2012    Past Surgical History:  Procedure Laterality Date   ABDOMINAL HYSTERECTOMY     BACK SURGERY     CARPAL TUNNEL RELEASE Right 06/2012   CATARACT EXTRACTION W/PHACO Left 11/19/2014   Procedure: CATARACT EXTRACTION PHACO AND INTRAOCULAR LENS PLACEMENT ([place]);  Surgeon: Williams Che, MD;  [facility]: [facility];  Service: Ophthalmology;  Laterality: Left;  CDE: 4.77   CATARACT EXTRACTION W/PHACO Right 02/03/2015   Procedure: CATARACT EXTRACTION PHACO AND INTRAOCULAR LENS PLACEMENT (IOC);  Surgeon: Williams Che, MD;  Location: [facility];  Service: Ophthalmology;  Laterality: Right;  CDE: 4.54   COLONOSCOPY  06/19/2008   RMR: tortuous and elongated colon with scattered left-sided diverticula/colonic  mucosa appeared entirely normal. Prior colonic ulcers had resolved.   COLONOSCOPY  12/2007   Dr. Gala Romney: Scattered diffuse sigmoid diverticula, 2 areas of ulceration at the hepatic flexure. Biopsies unremarkable.   COLONOSCOPY N/A 11/20/2012   next TCS 11/2017   COLONOSCOPY WITH PROPOFOL N/A 12/22/2017   Procedure: COLONOSCOPY WITH PROPOFOL;  Surgeon: Daneil Dolin, MD; diverticula in the sigmoid and descending colon and 2 tubular adenomas.  No recommendations to repeat.     DECOMPRESSIVE LUMBAR LAMINECTOMY LEVEL 2  04/20/2012   Procedure: DECOMPRESSIVE LUMBAR LAMINECTOMY LEVEL 2;  Surgeon: Tobi Bastos, MD;  [facility];  Service: Orthopedics;  Laterality: Right;  Decompressive Lumbar Laminectomy of the L4 - L5 and L5 - S1 Complete/Laminectomy L5 on the Right (X-Ray)   ESOPHAGOGASTRODUODENOSCOPY  12/2007   Dr. 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 Small hiatal hernia. Slightly pale duodenal mucosa (biopsy unremarkable)   ESOPHAGOGASTRODUODENOSCOPY (EGD) WITH PROPOFOL N/A 01/20/2017   Dr. Gala Romney: Medium sized hiatal hernia empiric esophageal dilation for history of dysphagia   ESOPHAGOGASTRODUODENOSCOPY (EGD) WITH PROPOFOL N/A 04/08/2020   Surgeon: Eloise Harman, DO;  medium size hiatal hernia 3 to 4 cm chicken bone lodged in the gastric antrum just proximal to the pylorus removed with a snare with mild bleeding at the site of lodged bone.  Normal examined duodenum.   EYE SURGERY     BIL CATARACTS   FOREIGN BODY REMOVAL N/A 04/08/2020   Procedure: FOREIGN BODY REMOVAL;  Surgeon: Eloise Harman, DO;  [facility];  Service: Endoscopy;  Laterality: N/A;   INCISIONAL HERNIA REPAIR N/A 11/12/2020   Procedure: HERNIA REPAIR INCISIONAL;  Surgeon: Aviva Signs, MD;  Location: [facility];  Service: General;  Laterality: N/A;   IR KYPHO THORACIC WITH BONE BIOPSY  02/08/2017   IR RADIOLOGIST EVAL & MGMT  02/02/2017   JOINT  REPLACEMENT     KNEE ARTHROSCOPY Right    MALONEY DILATION N/A 01/20/2017   Procedure: MALONEY DILATION;  Surgeon: Daneil Dolin, MD;  [facility];  Service: Endoscopy;  Laterality: N/A;   POLYPECTOMY  12/22/2017   Procedure: POLYPECTOMY;  Surgeon: Daneil Dolin, MD;  [facility];  Service: Endoscopy;;   REVERSE SHOULDER ARTHROPLASTY Right 05/24/2014   Procedure: RIGHT SHOULDER REVERSE ARTHROPLASTY;  Surgeon: Netta Cedars, MD;  Location: [place];  Service: Orthopedics;  Laterality: Right;   REVERSE SHOULDER ARTHROPLASTY Left 06/10/2017   Procedure: LEFT REVERSE SHOULDER ARTHROPLASTY;  Surgeon: Netta Cedars, MD;  Location: [place];  Service: Orthopedics;  Laterality: Left;   TOTAL KNEE ARTHROPLASTY  01/24/2012   Procedure: TOTAL KNEE ARTHROPLASTY;  Surgeon: Gearlean Alf, MD;  [facility];  Service: Orthopedics;  Laterality: Right;   TOTAL KNEE ARTHROPLASTY Left 10/15/2019   Procedure: TOTAL KNEE ARTHROPLASTY;  Surgeon: Gaynelle Arabian, MD;  [facility];  Service: Orthopedics;  Laterality: Left;  55min   TUBAL LIGATION      There were no vitals filed for this visit.    Subjective Assessment - 03/18/21 0955     Subjective Pt stated dressings have slid down, used tape to keep up.  Reports intermittent burning.    Currently in Pain? Yes    Pain Score 6     Pain Location Leg    Pain Orientation Right;Anterior;Distal    Pain Descriptors / Indicators Burning    Pain Type Acute pain                       Wound Therapy - 03/18/21 0955     Subjective Pt stated dressings have slid down, used tape to keep up.  Reports intermittent burning.    Patient and Family Stated Goals decreased pain,wound to heal    Date of Onset 02/14/21    Prior Treatments self care; antibiotics    Pain Scale 0-10    Pain Onset With Activity    Patients Stated Pain Goal 0    Pain Intervention(s) Emotional support;Repositioned   squeeze football   Evaluation and  Treatment Procedures Explained to Patient/Family Yes    Evaluation and Treatment Procedures agreed to    Wound Properties Date First Assessed: 03/12/21 Time First Assessed: 1135 Wound Type: Puncture Location: Pretibial Location Orientation: Proximal;Right Wound Description (Comments): Rt lateral LE Present on Admission: Yes   Wound Image Images linked: 1    Dressing Type Gauze (Comment)   Medihoney alginate, vaseline perimeter, 2x2, medipore tape, kerlix, netting #5   Dressing Changed Changed    Dressing Status Old drainage    Dressing Change Frequency PRN    Site / Wound Assessment Yellow;Red    % Wound base Red or Granulating 40%    % Wound base Yellow/Fibrinous Exudate 60%    Peri-wound Assessment Intact    Drainage Amount Moderate  Drainage Description Serous    Treatment Cleansed;Debridement (Selective)    Selective Debridement - Location entire wound bed    Selective Debridement - Tools Used Forceps;Scalpel;Scissors    Selective Debridement - Tissue Removed slough    Wound Therapy - Clinical Statement Selective debridement for removal of slough from wound bed and dry skin perimeter to promote healing.  Continued with medihoney wound bed and vaseline perimeter. Used medipore tape prior kerlix to address sliding down.  Pt requested measurements for new compression garments, measurements complete and given handout for ETI.    Wound Therapy - Functional Problem List difficulty with ambulation as well as dressing    Hydrotherapy Plan Debridement;Dressing change;Patient/family education    Wound Therapy - Frequency 2X / week   4 weeks   Wound Therapy - Current Recommendations PT    Wound Plan see pt 2x a week for 4 weeks for debridement and dressing change    Dressing  vaseline around the wound, honey alginate to wound bed, 4x4 , kerlix and compression garment.                       PT Short Term Goals - 03/12/21 1245       PT SHORT TERM GOAL #1   Title Wound base on Rt  LE to be 100% granulated    Time 2    Period Weeks    Status New    Target Date 03/26/21      PT SHORT TERM GOAL #2   Title PT pain no greater than a 2/10 to allow pt to be able to tolerate walking easier.    Time 2    Period Weeks    Status New               PT Long Term Goals - 03/12/21 1246       PT LONG TERM GOAL #1   Title Pt wound to be healed so there is no risk of cellulitis    Time 4    Period Weeks    Status New    Target Date 04/09/21                    Patient will benefit from skilled therapeutic intervention in order to improve the following deficits and impairments:     Visit Diagnosis: Superficial foreign body of right lower extremity without major open wound with infection, initial encounter  Pain in right lower leg     Problem List Patient Active Problem List   Diagnosis Date Noted   Hypokalemia 01/07/2021   Hypermagnesemia 01/07/2021   Dehydration 01/07/2021   Hiatal hernia 01/07/2021   Leukocytosis 01/07/2021   Mixed hyperlipidemia 01/07/2021   Chronic diastolic CHF (congestive heart failure) ([place]) 01/07/2021   Chronic pain syndrome 01/07/2021   Obesity (BMI 35.0-39.9 without comorbidity) 01/07/2021   Abdominal pain 11/11/2020   Incisional hernia, without obstruction or gangrene    Nausea with vomiting 01/14/2020   Chronic bilateral low back pain 10/25/2019   Chronic constipation 10/22/2019   Bilateral lower extremity edema 10/22/2019   Chronic anemia 10/22/2019   Post-menopausal osteoporosis 10/22/2019   Tachycardia 10/18/2019   Primary osteoarthritis of knee 10/16/2019   Primary osteoarthritis of left knee 10/15/2019   Left lower quadrant abdominal pain 02/16/2019   Hx of adenomatous colonic polyps 11/28/2017   S/P shoulder replacement, 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   Eosinophil count raised 06/09/2016   Severe persistent asthma 06/07/2016   COPD (chronic  obstructive pulmonary disease) ([place]) 12/16/2015   Asthma exacerbation  09/20/2015   Acute respiratory failure ([place]) 09/20/2015   CKD (chronic kidney disease) stage 3, GFR 30-59 ml/min (HCC) 09/20/2015   Hemorrhoids, internal 08/21/2015   S/P shoulder replacement 05/24/2014   Microcytic anemia 11/01/2012   Family hx of colon cancer 11/01/2012   Spinal stenosis of lumbar region without neurogenic claudication 04/20/2012   Herniated lumbar intervertebral disc 04/20/2012   Difficulty in walking(719.7) 03/13/2012   Stiffness of joint, not elsewhere classified, lower leg 03/13/2012   Weakness of right leg 03/13/2012   OA (osteoarthritis) of knee 01/24/2012   Anxiety 02/09/2011   HTN (hypertension) 02/09/2011   Arthritis 02/09/2011   GERD (gastroesophageal reflux disease) 02/09/2011   Rheumatoid arthritis ([place]) 02/09/2011   Ihor Austin, LPTA/CLT; CBIS [phone]  Aldona Lento, PTA 03/18/2021, 10:05 AM  [place], [place], [address] Phone: [phone]   Fax:  [phone]  Name: Glenda Terry MRN: [id number] Date of Birth: 1940-08-23

## 2021-03-19 DIAGNOSIS — M48061 Spinal stenosis, lumbar region without neurogenic claudication: Secondary | ICD-10-CM | POA: Diagnosis not present

## 2021-03-19 DIAGNOSIS — I110 Hypertensive heart disease with heart failure: Secondary | ICD-10-CM | POA: Diagnosis not present

## 2021-03-19 DIAGNOSIS — E1159 Type 2 diabetes mellitus with other circulatory complications: Secondary | ICD-10-CM | POA: Diagnosis not present

## 2021-03-19 DIAGNOSIS — J449 Chronic obstructive pulmonary disease, unspecified: Secondary | ICD-10-CM | POA: Diagnosis not present

## 2021-03-19 DIAGNOSIS — I509 Heart failure, unspecified: Secondary | ICD-10-CM | POA: Diagnosis not present

## 2021-03-19 DIAGNOSIS — W2203XD Walked into furniture, subsequent encounter: Secondary | ICD-10-CM | POA: Diagnosis not present

## 2021-03-19 DIAGNOSIS — N183 Chronic kidney disease, stage 3 unspecified: Secondary | ICD-10-CM | POA: Diagnosis not present

## 2021-03-19 DIAGNOSIS — E1122 Type 2 diabetes mellitus with diabetic chronic kidney disease: Secondary | ICD-10-CM | POA: Diagnosis not present

## 2021-03-19 DIAGNOSIS — G8929 Other chronic pain: Secondary | ICD-10-CM | POA: Diagnosis not present

## 2021-03-19 DIAGNOSIS — Z515 Encounter for palliative care: Secondary | ICD-10-CM | POA: Diagnosis not present

## 2021-03-19 DIAGNOSIS — S81801D Unspecified open wound, right lower leg, subsequent encounter: Secondary | ICD-10-CM | POA: Diagnosis not present

## 2021-03-20 ENCOUNTER — Encounter (HOSPITAL_COMMUNITY): Payer: Self-pay

## 2021-03-20 ENCOUNTER — Other Ambulatory Visit: Payer: Self-pay

## 2021-03-20 ENCOUNTER — Ambulatory Visit (HOSPITAL_COMMUNITY): Payer: PPO

## 2021-03-20 DIAGNOSIS — S80851A Superficial foreign body, right lower leg, initial encounter: Secondary | ICD-10-CM | POA: Diagnosis not present

## 2021-03-20 DIAGNOSIS — M79661 Pain in right lower leg: Secondary | ICD-10-CM

## 2021-03-20 DIAGNOSIS — L089 Local infection of the skin and subcutaneous tissue, unspecified: Secondary | ICD-10-CM

## 2021-03-20 NOTE — Therapy (Signed)
[place], [place], [address] Phone: [phone]   Fax:  [phone]  Wound Care Therapy  Patient Details  Name: Glenda Terry MRN: [id number] Date of Birth: 1940/06/01 Referring Provider (PT): Allyn Kenner   Encounter Date: 03/20/2021   PT End of Session - 03/20/21 1211     Visit Number 3    Number of Visits 8    Date for PT Re-Evaluation 04/11/21    Authorization Type [employer]    PT Start Time 1115    PT Stop Time 1145    PT Time Calculation (min) 30 min    Activity Tolerance Patient tolerated treatment well    Behavior During Therapy [facility] for tasks assessed/performed             Past Medical History:  Diagnosis Date   Anxiety    Asthma    Back pain, chronic    CKD (chronic kidney disease) stage 3, GFR 30-59 ml/min (HCC)    Compression fx, thoracic spine ([place])    T - 11   COPD (chronic obstructive pulmonary disease) ([place]) 12/16/2015   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[place])    Essential hypertension    GERD (gastroesophageal reflux disease)    HOH (hard of hearing)    Inflammatory arthritis    Iron deficiency anemia 01/2012   Pulmonary eosinophilia ([place])    Spinal stenosis    Tubular adenoma 11/2012    Past Surgical History:  Procedure Laterality Date   ABDOMINAL HYSTERECTOMY     BACK SURGERY     CARPAL TUNNEL RELEASE Right 06/2012   CATARACT EXTRACTION W/PHACO Left 11/19/2014   Procedure: CATARACT EXTRACTION PHACO AND INTRAOCULAR LENS PLACEMENT ([place]);  Surgeon: Williams Che, MD;  Location: [facility];  Service: Ophthalmology;  Laterality: Left;  CDE: 4.77   CATARACT EXTRACTION W/PHACO Right 02/03/2015   Procedure: CATARACT EXTRACTION PHACO AND INTRAOCULAR LENS PLACEMENT (I[facility]);  Surgeon: Williams Che, MD;  [facility]: [facility];  Service: Ophthalmology;  Laterality: Right;  CDE: 4.54   COLONOSCOPY  06/19/2008   RMR: tortuous and elongated colon with scattered left-sided diverticula/colonic  mucosa appeared entirely normal. Prior colonic ulcers had resolved.   COLONOSCOPY  12/2007   Dr. Gala Romney: Scattered diffuse sigmoid diverticula, 2 areas of ulceration at the hepatic flexure. Biopsies unremarkable.   COLONOSCOPY N/A 11/20/2012   next TCS 11/2017   COLONOSCOPY WITH PROPOFOL N/A 12/22/2017   Procedure: COLONOSCOPY WITH PROPOFOL;  Surgeon: Daneil Dolin, MD; diverticula in the sigmoid and descending colon and 2 tubular adenomas.  No recommendations to repeat.     DECOMPRESSIVE LUMBAR LAMINECTOMY LEVEL 2  04/20/2012   Procedure: DECOMPRESSIVE LUMBAR LAMINECTOMY LEVEL 2;  Surgeon: Tobi Bastos, MD;  [facility];  Service: Orthopedics;  Laterality: Right;  Decompressive Lumbar Laminectomy of the L4 - L5 and L5 - S1 Complete/Laminectomy L5 on the Right (X-Ray)   ESOPHAGOGASTRODUODENOSCOPY  12/2007   Dr. 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 Small hiatal hernia. Slightly pale duodenal mucosa (biopsy unremarkable)   ESOPHAGOGASTRODUODENOSCOPY (EGD) WITH PROPOFOL N/A 01/20/2017   Dr. Gala Romney: Medium sized hiatal hernia empiric esophageal dilation for history of dysphagia   ESOPHAGOGASTRODUODENOSCOPY (EGD) WITH PROPOFOL N/A 04/08/2020   Surgeon: Eloise Harman, DO;  medium size hiatal hernia 3 to 4 cm chicken bone lodged in the gastric antrum just proximal to the pylorus removed with a snare with mild bleeding at the site of lodged bone.  Normal examined duodenum.   EYE SURGERY     BIL CATARACTS   FOREIGN BODY REMOVAL N/A 04/08/2020   Procedure: FOREIGN BODY REMOVAL;  Surgeon: Eloise Harman, DO;  [facility];  Service: Endoscopy;  Laterality: N/A;   INCISIONAL HERNIA REPAIR N/A 11/12/2020   Procedure: HERNIA REPAIR INCISIONAL;  Surgeon: Aviva Signs, MD;  Location[facility];  Service: General;  Laterality: N/A;   IR KYPHO THORACIC WITH BONE BIOPSY  02/08/2017   IR RADIOLOGIST EVAL & MGMT  02/02/2017   JOINT  REPLACEMENT     KNEE ARTHROSCOPY Right    MALONEY DILATION N/A 01/20/2017   Procedure: MALONEY DILATION;  Surgeon: Daneil Dolin, MD;  [facility];  Service: Endoscopy;  Laterality: N/A;   POLYPECTOMY  12/22/2017   Procedure: POLYPECTOMY;  Surgeon: Daneil Dolin, MD;  [facility];  Service: Endoscopy;;   REVERSE SHOULDER ARTHROPLASTY Right 05/24/2014   Procedure: RIGHT SHOULDER REVERSE ARTHROPLASTY;  Surgeon: Netta Cedars, MD;  Location: [place];  Service: Orthopedics;  Laterality: Right;   REVERSE SHOULDER ARTHROPLASTY Left 06/10/2017   Procedure: LEFT REVERSE SHOULDER ARTHROPLASTY;  Surgeon: Netta Cedars, MD;  Location: [place];  Service: Orthopedics;  Laterality: Left;   TOTAL KNEE ARTHROPLASTY  01/24/2012   Procedure: TOTAL KNEE ARTHROPLASTY;  Surgeon: Gearlean Alf, MD;  [facility];  Service: Orthopedics;  Laterality: Right;   TOTAL KNEE ARTHROPLASTY Left 10/15/2019   Procedure: TOTAL KNEE ARTHROPLASTY;  Surgeon: Gaynelle Arabian, MD;  [facility];  Service: Orthopedics;  Laterality: Left;  32min   TUBAL LIGATION      There were no vitals filed for this visit.    Subjective Assessment - 03/20/21 1156     Subjective Pt reports wound is itchy, no reoprts of pain today.  Reports her balance is off, going to ask MD if she can begin PT.    Currently in Pain? No/denies                       Wound Therapy - 03/20/21 1156     Subjective Pt reports wound is itchy, no reoprts of pain today.  Reports her balance is off, going to ask MD if she can begin PT.    Patient and Family Stated Goals decreased pain,wound to heal    Date of Onset 02/14/21    Prior Treatments self care; antibiotics    Pain Scale 0-10    Pain Score 0-No pain   itchy   Pain Type Acute pain    Pain Location Leg    Pain Orientation Right;Anterior    Pain Descriptors / Indicators Burning   itchy   Pain Onset With Activity    Patients Stated Pain Goal 0    Pain  Intervention(s) Repositioned;Emotional support   bac   Evaluation and Treatment Procedures Explained to Patient/Family Yes    Evaluation and Treatment Procedures agreed to    Wound Properties Date First Assessed: 03/12/21 Time First Assessed: 8341 Wound Type: Puncture Location: Pretibial Location Orientation: Proximal;Right Wound Description (Comments): Rt lateral LE Present on Admission: Yes   Dressing Type --   medihoney at 11:00, xeroform wound bed, vaseline perimeter, 2x2, medipore tape, kerlix, compression garment   Dressing Changed Changed    Dressing Status Old drainage    Dressing Change Frequency PRN    Site / Wound Assessment Yellow;Red;Granulation tissue    % Wound base Red or Granulating 90%    %  Wound base Yellow/Fibrinous Exudate 10%    Peri-wound Assessment Intact    Drainage Amount Minimal    Drainage Description Serous    Treatment Cleansed;Debridement (Selective)    Selective Debridement - Location entire wound bed    Selective Debridement - Tools Used Forceps;Scalpel;Scissors    Selective Debridement - Tissue Removed slough    Wound Therapy - Clinical Statement Improved granulation following debridement with minimal slough except at 11:00 of the wound.  Continued wiht medihoney over slough and xeroform rest of wound bed to prevent wound from drying out.  Medipore tape assisted in dressings sliding down. No reports of pain, did reports some burning at EOS.    Wound Therapy - Functional Problem List difficulty with ambulation as well as dressing    Hydrotherapy Plan Debridement;Dressing change;Patient/family education    Wound Therapy - Frequency 2X / week   4 weeks   Wound Therapy - Current Recommendations PT    Wound Plan see pt 2x a week for 4 weeks for debridement and dressing change    Dressing  vaseline around the wound, honey alginate at 11:00, xeroform rest of wound bed. 4x4 , kerlix and compression garment.                       PT Short Term Goals -  03/12/21 1245       PT SHORT TERM GOAL #1   Title Wound base on Rt LE to be 100% granulated    Time 2    Period Weeks    Status New    Target Date 03/26/21      PT SHORT TERM GOAL #2   Title PT pain no greater than a 2/10 to allow pt to be able to tolerate walking easier.    Time 2    Period Weeks    Status New               PT Long Term Goals - 03/12/21 1246       PT LONG TERM GOAL #1   Title Pt wound to be healed so there is no risk of cellulitis    Time 4    Period Weeks    Status New    Target Date 04/09/21                    Patient will benefit from skilled therapeutic intervention in order to improve the following deficits and impairments:     Visit Diagnosis: Superficial foreign body of right lower extremity without major open wound with infection, initial encounter  Pain in right lower leg     Problem List Patient Active Problem List   Diagnosis Date Noted   Hypokalemia 01/07/2021   Hypermagnesemia 01/07/2021   Dehydration 01/07/2021   Hiatal hernia 01/07/2021   Leukocytosis 01/07/2021   Mixed hyperlipidemia 01/07/2021   Chronic diastolic CHF (congestive heart failure) ([place]) 01/07/2021   Chronic pain syndrome 01/07/2021   Obesity (BMI 35.0-39.9 without comorbidity) 01/07/2021   Abdominal pain 11/11/2020   Incisional hernia, without obstruction or gangrene    Nausea with vomiting 01/14/2020   Chronic bilateral low back pain 10/25/2019   Chronic constipation 10/22/2019   Bilateral lower extremity edema 10/22/2019   Chronic anemia 10/22/2019   Post-menopausal osteoporosis 10/22/2019   Tachycardia 10/18/2019   Primary osteoarthritis of knee 10/16/2019   Primary osteoarthritis of left knee 10/15/2019   Left lower quadrant abdominal pain 02/16/2019   Hx of adenomatous colonic polyps  11/28/2017   S/P shoulder replacement, 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   Eosinophil count raised 06/09/2016   Severe persistent asthma 06/07/2016   COPD (chronic  obstructive pulmonary disease) ([place]) 12/16/2015   Asthma exacerbation 09/20/2015   Acute respiratory failure ([place]) 09/20/2015   CKD (chronic kidney disease) stage 3, GFR 30-59 ml/min (HCC) 09/20/2015   Hemorrhoids, internal 08/21/2015   S/P shoulder replacement 05/24/2014   Microcytic anemia 11/01/2012   Family hx of colon cancer 11/01/2012   Spinal stenosis of lumbar region without neurogenic claudication 04/20/2012   Herniated lumbar intervertebral disc 04/20/2012   Difficulty in walking(719.7) 03/13/2012   Stiffness of joint, not elsewhere classified, lower leg 03/13/2012   Weakness of right leg 03/13/2012   OA (osteoarthritis) of knee 01/24/2012   Anxiety 02/09/2011   HTN (hypertension) 02/09/2011   Arthritis 02/09/2011   GERD (gastroesophageal reflux disease) 02/09/2011   Rheumatoid arthritis ([place]) 02/09/2011   Ihor Austin, LPTA/CLT; CBIS [phone]  Aldona Lento, PTA 03/20/2021, 12:12 PM  [place] [address] [place], [place], [address] Phone: [phone]   Fax:  [phone]  Name: Glenda Terry MRN: [id number] Date of Birth: 09/24/1940

## 2021-03-23 ENCOUNTER — Telehealth: Payer: Self-pay | Admitting: Internal Medicine

## 2021-03-23 DIAGNOSIS — S81801A Unspecified open wound, right lower leg, initial encounter: Secondary | ICD-10-CM | POA: Diagnosis not present

## 2021-03-23 NOTE — Telephone Encounter (Signed)
Returned call to daughter - patient of Margaretann Loveless MD  Patient is having swelling in R leg x7 days but looks like more swollen of recent. Daughter reports wound on this leg. She is being treated at wound care at [facility]. She said wound does not look infected. She reports possible pitting edema. Echo from 07/2020 was WNL. Patient takes torsemide 20mg  tablets -- 40mg  on MWF and 20mg  all other days.   Daughter is unsure of weights - will call patient for update on this  Per chart review, PCP has been seeing patient for leg wound. Encouraged daughter to call PCP for advice also.   Routed to primary nurse/covering MD

## 2021-03-23 NOTE — Telephone Encounter (Signed)
Pt c/o swelling: STAT is pt has developed SOB within 24 hours  How much weight have you gained and in what time span?  Patient's daughter is unsure   If swelling, where is the swelling located?  Right leg   Are you currently taking a fluid pill?  Yes   Are you currently SOB?  No   Do you have a log of your daily weights (if so, list)?  No log available  Have you gained 3 pounds in a day or 5 pounds in a week?  Unsure   Have you traveled recently?  No

## 2021-03-25 ENCOUNTER — Encounter (HOSPITAL_COMMUNITY): Payer: Self-pay | Admitting: Physical Therapy

## 2021-03-25 ENCOUNTER — Other Ambulatory Visit: Payer: Self-pay

## 2021-03-25 ENCOUNTER — Ambulatory Visit (HOSPITAL_COMMUNITY): Payer: PPO | Admitting: Physical Therapy

## 2021-03-25 DIAGNOSIS — M79661 Pain in right lower leg: Secondary | ICD-10-CM

## 2021-03-25 DIAGNOSIS — L089 Local infection of the skin and subcutaneous tissue, unspecified: Secondary | ICD-10-CM

## 2021-03-25 DIAGNOSIS — S80851A Superficial foreign body, right lower leg, initial encounter: Secondary | ICD-10-CM | POA: Diagnosis not present

## 2021-03-25 NOTE — Therapy (Signed)
[facility] [place], [place], [address] Phone: [phone]   Fax:  [phone]  Wound Care Therapy  Patient Details  Name: Glenda Terry MRN: [id number] Date of Birth: 05/25/1940 Referring Provider (PT): Allyn Kenner   Encounter Date: 03/25/2021   PT End of Session - 03/25/21 1132     Visit Number 4    Number of Visits 8    Date for PT Re-Evaluation 04/11/21    Authorization Type [employer]    PT Start Time 1049    PT Stop Time 1120    PT Time Calculation (min) 31 min    Activity Tolerance Patient tolerated treatment well    Behavior During Therapy [facility] for tasks assessed/performed             Past Medical History:  Diagnosis Date   Anxiety    Asthma    Back pain, chronic    CKD (chronic kidney disease) stage 3, GFR 30-59 ml/min (HCC)    Compression fx, thoracic spine ([place])    T - 11   COPD (chronic obstructive pulmonary disease) ([place]) 12/16/2015   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[place])    Essential hypertension    GERD (gastroesophageal reflux disease)    HOH (hard of hearing)    Inflammatory arthritis    Iron deficiency anemia 01/2012   Pulmonary eosinophilia ([place])    Spinal stenosis    Tubular adenoma 11/2012    Past Surgical History:  Procedure Laterality Date   ABDOMINAL HYSTERECTOMY     BACK SURGERY     CARPAL TUNNEL RELEASE Right 06/2012   CATARACT EXTRACTION W/PHACO Left 11/19/2014   Procedure: CATARACT EXTRACTION PHACO AND INTRAOCULAR LENS PLACEMENT ([place]);  Surgeon: Williams Che, MD;  Location: [facility];  Service: Ophthalmology;  Laterality: Left;  CDE: 4.77   CATARACT EXTRACTION W/PHACO Right 02/03/2015   Procedure: CATARACT EXTRACTION PHACO AND INTRAOCULAR LENS PLACEMENT ([facility]);  Surgeon: Williams Che, MD;  Location: [facility];  Service: Ophthalmology;  Laterality: Right;  CDE: 4.54   COLONOSCOPY  06/19/2008   RMR: tortuous and elongated colon with scattered left-sided diverticula/colonic  mucosa appeared entirely normal. Prior colonic ulcers had resolved.   COLONOSCOPY  12/2007   Dr. Gala Romney: Scattered diffuse sigmoid diverticula, 2 areas of ulceration at the hepatic flexure. Biopsies unremarkable.   COLONOSCOPY N/A 11/20/2012   next TCS 11/2017   COLONOSCOPY WITH PROPOFOL N/A 12/22/2017   Procedure: COLONOSCOPY WITH PROPOFOL;  Surgeon: Daneil Dolin, MD; diverticula in the sigmoid and descending colon and 2 tubular adenomas.  No recommendations to repeat.     DECOMPRESSIVE LUMBAR LAMINECTOMY LEVEL 2  04/20/2012   Procedure: DECOMPRESSIVE LUMBAR LAMINECTOMY LEVEL 2;  Surgeon: Tobi Bastos, MD;  Location: [facility];  Service: Orthopedics;  Laterality: Right;  Decompressive Lumbar Laminectomy of the L4 - L5 and L5 - S1 Complete/Laminectomy L5 on the Right (X-Ray)   ESOPHAGOGASTRODUODENOSCOPY  12/2007   Dr. 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 Small hiatal hernia. Slightly pale duodenal mucosa (biopsy unremarkable)   ESOPHAGOGASTRODUODENOSCOPY (EGD) WITH PROPOFOL N/A 01/20/2017   Dr. Gala Romney: Medium sized hiatal hernia empiric esophageal dilation for history of dysphagia   ESOPHAGOGASTRODUODENOSCOPY (EGD) WITH PROPOFOL N/A 04/08/2020   Surgeon: Eloise Harman, DO;  medium size hiatal hernia 3 to 4 cm chicken bone lodged in the gastric antrum just proximal to the pylorus removed with a snare with mild bleeding at the site of lodged bone.  Normal examined duodenum.   EYE SURGERY     BIL CATARACTS   FOREIGN BODY REMOVAL N/A 04/08/2020   Procedure: FOREIGN BODY REMOVAL;  Surgeon: Eloise Harman, DO;  [facility];  Service: Endoscopy;  Laterality: N/A;   INCISIONAL HERNIA REPAIR N/A 11/12/2020   Procedure: HERNIA REPAIR INCISIONAL;  Surgeon: Aviva Signs, MD;  Location: [facility];  Service: General;  Laterality: N/A;   IR KYPHO THORACIC WITH BONE BIOPSY  02/08/2017   IR RADIOLOGIST EVAL & MGMT  02/02/2017   JOINT  REPLACEMENT     KNEE ARTHROSCOPY Right    MALONEY DILATION N/A 01/20/2017   Procedure: MALONEY DILATION;  Surgeon: Daneil Dolin, MD;  [facility];  Service: Endoscopy;  Laterality: N/A;   POLYPECTOMY  12/22/2017   Procedure: POLYPECTOMY;  Surgeon: Daneil Dolin, MD;  [facility];  Service: Endoscopy;;   REVERSE SHOULDER ARTHROPLASTY Right 05/24/2014   Procedure: RIGHT SHOULDER REVERSE ARTHROPLASTY;  Surgeon: Netta Cedars, MD;  Location: [place];  Service: Orthopedics;  Laterality: Right;   REVERSE SHOULDER ARTHROPLASTY Left 06/10/2017   Procedure: LEFT REVERSE SHOULDER ARTHROPLASTY;  Surgeon: Netta Cedars, MD;  Location: [place];  Service: Orthopedics;  Laterality: Left;   TOTAL KNEE ARTHROPLASTY  01/24/2012   Procedure: TOTAL KNEE ARTHROPLASTY;  Surgeon: Gearlean Alf, MD;  [facility]: [facility];  Service: Orthopedics;  Laterality: Right;   TOTAL KNEE ARTHROPLASTY Left 10/15/2019   Procedure: TOTAL KNEE ARTHROPLASTY;  Surgeon: Gaynelle Arabian, MD;  Location: [facility];  Service: Orthopedics;  Laterality: Left;  67min   TUBAL LIGATION      There were no vitals filed for this visit.      [facility] PT Assessment - 03/25/21 0001       Assessment   Medical Diagnosis Non healing wound    Referring Provider (PT) Allyn Kenner                     Wound Therapy - 03/25/21 0001     Subjective States dressing came off so she called [facility] who works with her insurance company and they redressed the wound. States she has been having a difficult time getting her compression socks on and comes in with ted hoes on.    Patient and Family Stated Goals decreased pain,wound to heal    Date of Onset 02/14/21    Prior Treatments self care; antibiotics    Pain Scale 0-10    Pain Score 0-No pain    Evaluation and Treatment Procedures Explained to Patient/Family Yes    Evaluation and Treatment Procedures agreed to    Wound Properties Date First Assessed: 03/12/21 Time First  Assessed: 1135 Wound Type: Puncture Location: Pretibial Location Orientation: Proximal;Right Wound Description (Comments): Rt lateral LE Present on Admission: Yes   Dressing Type Gauze (Comment)   coban   Dressing Changed Changed    Dressing Status Old drainage    Dressing Change Frequency PRN    Site / Wound Assessment Yellow;Pink;Red    % Wound base Red or Granulating 80%    % Wound base Yellow/Fibrinous Exudate 20%    Peri-wound Assessment Intact;Edema   dry   Wound Length (cm) 2.2 cm   was 3   Wound Width (cm) 1.8 cm   was 2.2   Wound Depth (cm) 0.4 cm   was .4   Wound Volume (cm^3) 1.58 cm^3    Wound Surface Area (cm^2) 3.96 cm^2    Undermining (  cm) from 11-12 o'clock .2cm    Margins Unattached edges (unapproximated)    Drainage Amount Minimal    Drainage Description Serosanguineous    Treatment Debridement (Selective);Cleansed    Selective Debridement - Location entire wound bed    Selective Debridement - Tools Used Scalpel    Selective Debridement - Tissue Removed slough    Wound Therapy - Clinical Statement Patient with swelling throughout leg and increased slough on this date. Continue with debridement, edema massage and changed to profore lite for more conformed compression. Educated patient on how to doff dressing if painful or becomes wet. Patient with difficulty getting compression garments on and wore ted hoes which were stretched out. Tried butler with ted hoes but very stretched out. Instructed patient to bring in compression socks next time to practice with butler on left leg. Comfort reported with dressing end of session.    Wound Therapy - Functional Problem List difficulty with ambulation as well as dressing    Hydrotherapy Plan Debridement;Dressing change;Patient/family education    Wound Therapy - Frequency 2X / week   4 weeks   Wound Therapy - Current Recommendations PT    Wound Plan f/u with butler for compression garment    Dressing  vaseline around the wound,  honey alginate, profore lite, nettign #5                       PT Short Term Goals - 03/12/21 1245       PT SHORT TERM GOAL #1   Title Wound base on Rt LE to be 100% granulated    Time 2    Period Weeks    Status New    Target Date 03/26/21      PT SHORT TERM GOAL #2   Title PT pain no greater than a 2/10 to allow pt to be able to tolerate walking easier.    Time 2    Period Weeks    Status New               PT Long Term Goals - 03/12/21 1246       PT LONG TERM GOAL #1   Title Pt wound to be healed so there is no risk of cellulitis    Time 4    Period Weeks    Status New    Target Date 04/09/21                    Patient will benefit from skilled therapeutic intervention in order to improve the following deficits and impairments:     Visit Diagnosis: Superficial foreign body of right lower extremity without major open wound with infection, initial encounter  Pain in right lower leg     Problem List Patient Active Problem List   Diagnosis Date Noted   Hypokalemia 01/07/2021   Hypermagnesemia 01/07/2021   Dehydration 01/07/2021   Hiatal hernia 01/07/2021   Leukocytosis 01/07/2021   Mixed hyperlipidemia 01/07/2021   Chronic diastolic CHF (congestive heart failure) ([place]) 01/07/2021   Chronic pain syndrome 01/07/2021   Obesity (BMI 35.0-39.9 without comorbidity) 01/07/2021   Abdominal pain 11/11/2020   Incisional hernia, without obstruction or gangrene    Nausea with vomiting 01/14/2020   Chronic bilateral low back pain 10/25/2019   Chronic constipation 10/22/2019   Bilateral lower extremity edema 10/22/2019   Chronic anemia 10/22/2019   Post-menopausal osteoporosis 10/22/2019   Tachycardia 10/18/2019   Primary osteoarthritis of knee 10/16/2019   Primary  osteoarthritis of left knee 10/15/2019   Left lower quadrant abdominal pain 02/16/2019   Hx of adenomatous colonic polyps 11/28/2017   S/P shoulder replacement, left  06/10/2017   Eosinophil count raised 06/09/2016   Severe persistent asthma 06/07/2016   COPD (chronic obstructive pulmonary disease) ([place]) 12/16/2015   Asthma exacerbation 09/20/2015   Acute respiratory failure ([place]) 09/20/2015   CKD (chronic kidney disease) stage 3, GFR 30-59 ml/min (HCC) 09/20/2015   Hemorrhoids, internal 08/21/2015   S/P shoulder replacement 05/24/2014   Microcytic anemia 11/01/2012   Family hx of colon cancer 11/01/2012   Spinal stenosis of lumbar region without neurogenic claudication 04/20/2012   Herniated lumbar intervertebral disc 04/20/2012   Difficulty in walking(719.7) 03/13/2012   Stiffness of joint, not elsewhere classified, lower leg 03/13/2012   Weakness of right leg 03/13/2012   OA (osteoarthritis) of knee 01/24/2012   Anxiety 02/09/2011   HTN (hypertension) 02/09/2011   Arthritis 02/09/2011   GERD (gastroesophageal reflux disease) 02/09/2011   Rheumatoid arthritis ([place]) 02/09/2011    11:38 AM, 03/25/21 Jerene Pitch, DPT Physical Therapy with [facility]  [phone] office   [facility] [address] [place], [place], [address] Phone: [phone]   Fax:  [phone]  Name: PEGI MILAZZO MRN: [id number] Date of Birth: 09/04/1940

## 2021-03-26 ENCOUNTER — Ambulatory Visit: Payer: PPO | Admitting: Gastroenterology

## 2021-03-26 ENCOUNTER — Telehealth (HOSPITAL_COMMUNITY): Payer: Self-pay | Admitting: Physical Therapy

## 2021-03-26 ENCOUNTER — Encounter (HOSPITAL_BASED_OUTPATIENT_CLINIC_OR_DEPARTMENT_OTHER): Payer: PPO | Attending: Internal Medicine | Admitting: Internal Medicine

## 2021-03-26 DIAGNOSIS — I890 Lymphedema, not elsewhere classified: Secondary | ICD-10-CM | POA: Insufficient documentation

## 2021-03-26 DIAGNOSIS — L97812 Non-pressure chronic ulcer of other part of right lower leg with fat layer exposed: Secondary | ICD-10-CM | POA: Insufficient documentation

## 2021-03-26 DIAGNOSIS — W2203XA Walked into furniture, initial encounter: Secondary | ICD-10-CM | POA: Diagnosis not present

## 2021-03-26 DIAGNOSIS — T798XXA Other early complications of trauma, initial encounter: Secondary | ICD-10-CM

## 2021-03-26 DIAGNOSIS — M170 Bilateral primary osteoarthritis of knee: Secondary | ICD-10-CM | POA: Insufficient documentation

## 2021-03-26 DIAGNOSIS — I872 Venous insufficiency (chronic) (peripheral): Secondary | ICD-10-CM | POA: Insufficient documentation

## 2021-03-26 DIAGNOSIS — M069 Rheumatoid arthritis, unspecified: Secondary | ICD-10-CM | POA: Diagnosis not present

## 2021-03-26 DIAGNOSIS — M19011 Primary osteoarthritis, right shoulder: Secondary | ICD-10-CM | POA: Insufficient documentation

## 2021-03-26 DIAGNOSIS — I10 Essential (primary) hypertension: Secondary | ICD-10-CM | POA: Diagnosis not present

## 2021-03-26 NOTE — Progress Notes (Signed)
Glenda, Terry ([id number]) Visit Report for 03/26/2021 Chief Complaint Document Details Patient Name: Date of Service: Glenda Terry 03/26/2021 7:30 A M Medical Record Number: [id number] Patient Account Number: [id number] Date of Birth/Sex: Treating RN: January 12, 1941 (81 y.o. [name] Primary Care Provider: Allyn Kenner Other Clinician: Referring Provider: Treating Provider/Extender: Dierdre Harness in Treatment: 0 Information Obtained from: Patient Chief Complaint 03/26/2021; Right lower extremity wound Following trauma Electronic Signature(s) Signed: 03/26/2021 9:11:28 AM By: Kalman Shan DO Entered By: Kalman Shan on 03/26/2021 08:59:47 -------------------------------------------------------------------------------- Debridement Details Patient Name: Date of Service: Glenda Terry. 03/26/2021 7:30 A M Medical Record Number: [id number] Patient Account Number: [id number] Date of Birth/Sex: Treating RN: November 11, 1940 (81 y.o. [name] Primary Care Provider: Allyn Kenner Other Clinician: Referring Provider: Treating Provider/Extender: Dierdre Harness in Treatment: 0 Debridement Performed for Assessment: Wound #1 Right,Lateral Lower Leg Performed By: Physician Kalman Shan, DO Debridement Type: Debridement Severity of Tissue Pre Debridement: Fat layer exposed Level of Consciousness (Pre-procedure): Awake and Alert Pre-procedure Verification/Time Out Yes - 08:51 Taken: Start Time: 08:51 T Area Debrided (L x W): otal 2 (cm) x 1.2 (cm) = 2.4 (cm) Tissue and other material debrided: Viable, Non-Viable, Slough, Subcutaneous, Slough Level: Skin/Subcutaneous Tissue Debridement Description: Excisional Instrument: Curette Bleeding: Minimum Hemostasis Achieved: Pressure End Time: 08:53 Procedural Pain: 0 Post Procedural Pain: 0 Response to Treatment: Procedure was tolerated well Level of Consciousness  (Post- Awake and Alert procedure): Post Debridement Measurements of Total Wound Length: (cm) 2 Width: (cm) 1.2 Depth: (cm) 0.2 Volume: (cm) 0.377 Character of Wound/Ulcer Post Debridement: Improved Severity of Tissue Post Debridement: Fat layer exposed Post Procedure Diagnosis Same as Pre-procedure Electronic Signature(s) Signed: 03/26/2021 9:11:28 AM By: Kalman Shan DO Signed: 03/26/2021 6:02:53 PM By: Levan Hurst RN, BSN Entered By: Levan Hurst on 03/26/2021 08:53:37 -------------------------------------------------------------------------------- HPI Details Patient Name: Date of Service: Glenda Terry. 03/26/2021 7:30 A M Medical Record Number: [id number] Patient Account Number: [id number] Date of Birth/Sex: Treating RN: 23-Oct-1940 (81 y.o. 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 She states that she hit her leg against a table And developed a wound. She has been following 2 weeks with a wound care center in [place] for this issue. For the past 2 weeks she has had her legs wrapped with 3 layer compression and interchanging between Medihoney and silver alginate. She reports minimal improvement in wound healing. She currently denies signs of infection. Electronic Signature(s) Signed: 03/26/2021 9:11:28 AM By: Kalman Shan DO Entered By: Kalman Shan on 03/26/2021  09:02:30 -------------------------------------------------------------------------------- Physical Exam Details Patient Name: Date of Service: Glenda Terry. 03/26/2021 7:30 A M Medical Record Number: [id number] Patient Account Number: [id number] Date of Birth/Sex: Treating RN: 01-Oct-1940 (81 y.o. [name] Primary Care Provider: Allyn Kenner Other Clinician: Referring Provider: Treating Provider/Extender: Dierdre Harness in Treatment: 0 Constitutional respirations regular, non-labored and within target range for patient.. Cardiovascular 2+ dorsalis pedis/posterior tibialis pulses. Psychiatric pleasant and cooperative. Notes Right lower extremity: T the  lateral proximal aspect of the leg there is an open wound with granulation tissue and nonviable tissue present. No surrounding o signs of infection. Nonpitting edema to the knee. Electronic Signature(s) Signed: 03/26/2021 9:11:28 AM By: Kalman Shan DO Entered By: Kalman Shan on 03/26/2021 09:03:15 -------------------------------------------------------------------------------- Physician Orders Details Patient Name: Date of Service: Glenda Terry. 03/26/2021 7:30 A M Medical Record Number: [id number] Patient Account Number: [id number] Date of Birth/Sex: Treating RN: 1940/06/14 (81 y.o. [name] Primary Care Provider: Allyn Kenner Other Clinician: Referring Provider: Treating Provider/Extender: Dierdre Harness in Treatment: 0 Verbal / Phone Orders: No Diagnosis Coding ICD-10 Coding Code Description [id number] Non-pressure chronic ulcer of other part of right lower leg with fat layer exposed I87.2 Venous insufficiency (chronic) (peripheral) Follow-up Appointments ppointment in 1 week. - Dr. Heber [place] Return A Bathing/ Shower/ Hygiene May shower with protection but do not get wound dressing(s) wet. - Ok to use Market researcher, can purchase at [facility], [facility],  or [employer] Edema Control - Lymphedema / SCD / Other Elevate legs to the level of the heart or above for 30 minutes daily and/or when sitting, a frequency of: - throughout the day Avoid standing for long periods of time. Exercise regularly Wound Treatment Wound #1 - Lower Leg Wound Laterality: Right, Lateral Cleanser: Soap and Water 1 x Per Week Discharge Instructions: May shower and wash wound with dial antibacterial soap and water prior to dressing change. Cleanser: Wound Cleanser 1 x Per Week Discharge Instructions: Cleanse the wound with wound cleanser prior to applying a clean dressing using gauze sponges, not tissue or cotton balls. Peri-Wound Care: Sween Lotion (Moisturizing lotion) 1 x Per Week Discharge Instructions: Apply moisturizing lotion as directed Topical: Gentamicin 1 x Per Week Discharge Instructions: As directed by physician Prim Dressing: Hydrofera Blue Ready Foam, 2.5 x2.5 in 1 x Per Week ary Discharge Instructions: Apply to wound bed as instructed Secondary Dressing: Woven Gauze Sponge, Non-Sterile 4x4 in 1 x Per Week Discharge Instructions: Apply over primary dressing as directed. Compression Wrap: Kerlix Roll 4.5x3.1 (in/yd) 1 x Per Week Discharge Instructions: Apply Kerlix and Coban compression as directed. Compression Wrap: Coban Self-Adherent Wrap 4x5 (in/yd) 1 x Per Week Discharge Instructions: Apply over Kerlix as directed. Electronic Signature(s) Signed: 03/26/2021 9:11:28 AM By: Kalman Shan DO Entered By: Kalman Shan on 03/26/2021 09:03:58 -------------------------------------------------------------------------------- Problem List Details Patient Name: Date of Service: Glenda Alfred S. 03/26/2021 7:30 A M Medical Record Number: [id number] Patient Account Number: [id number] Date of Birth/Sex: Treating RN: 1940/09/12 (81 y.o. [name] Primary Care Provider: Allyn Kenner Other Clinician: Referring Provider: Treating  Provider/Extender: Dierdre Harness in Treatment: 0 Active Problems ICD-10 Encounter Code Description Active Date MDM Diagnosis [phone] Non-pressure chronic ulcer of other part of right lower leg with fat layer 03/26/2021 No Yes exposed I87.2 Venous insufficiency (chronic) (peripheral) 03/26/2021 No Yes T79.8XXA Other early complications of trauma, initial encounter 03/26/2021 No Yes I10 Essential (primary) hypertension 03/26/2021 No Yes J44.9 Chronic obstructive pulmonary disease, unspecified 03/26/2021 No Yes M06.9 Rheumatoid arthritis, unspecified 03/26/2021 No Yes Inactive Problems Resolved Problems Electronic Signature(s) Signed: 03/26/2021 9:11:28 AM By: Kalman Shan DO Entered By: Kalman Shan on 03/26/2021 08:58:44 -------------------------------------------------------------------------------- Progress Note Details Patient Name: Date of Service: Glenda Terry. 03/26/2021 7:30 A M Medical Record Number: [id number] Patient Account Number: [id number] Date of Birth/Sex: Treating RN: 06-10-1940 (81 y.o. [name] Primary Care Provider: Allyn Kenner Other Clinician: Referring Provider: Treating Provider/Extender: Heber [place]  Evette Georges, John Weeks in Treatment: 0 Subjective Chief Complaint Information obtained from Patient 03/26/2021; Right lower extremity wound Following trauma History of Present Illness (HPI) Admission 03/26/2021 Ms. 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 She states that she hit her leg against a table And developed a wound. She has been following 2 weeks with a wound care center in [place] for this  issue. For the past 2 weeks she has had her legs wrapped with 3 layer compression and interchanging between Medihoney and silver alginate. She reports minimal improvement in wound healing. She currently denies signs of infection. Patient History Information obtained from Patient. Allergies Flexeril, Keflex, chlorhexidine, fentanyl, aspirin Family History Unknown History. Social History Former smoker - quit 30 years ago, Alcohol Use - Never, Drug Use - No History, Caffeine Use - Rarely. Medical History Hematologic/Lymphatic Patient has history of Anemia Respiratory Patient has history of Chronic Obstructive Pulmonary Disease (COPD) Cardiovascular Patient has history of Hypertension Musculoskeletal Patient has history of Osteoarthritis Psychiatric Patient has history of Confinement Anxiety Medical A Surgical History Notes nd Gastrointestinal GERD Review of Systems (ROS) Constitutional Symptoms (General Health) Denies complaints or symptoms of Fatigue, Fever, Chills, Marked Weight Change. Eyes Denies complaints or symptoms of Dry Eyes, Vision Changes, Glasses / Contacts. Ear/Nose/Mouth/Throat Denies complaints or symptoms of Chronic sinus problems or rhinitis. Endocrine Denies complaints or symptoms of Heat/cold intolerance. Genitourinary Denies complaints or symptoms of Frequent urination. Integumentary (Skin) Complains or has symptoms of Wounds - wound on right lower leg. Neurologic Denies complaints or symptoms of Numbness/parasthesias. Objective Constitutional respirations regular, non-labored and within target range for patient.. Vitals Time Taken: 7:54 AM, Height: 60 in, Source: Stated, Weight: 180 lbs, Source: Stated, BMI: 35.2, Temperature: 98.1 F, Pulse: 92 bpm, Respiratory Rate: 16 breaths/min, Blood Pressure: 157/86 mmHg. Cardiovascular 2+ dorsalis pedis/posterior tibialis pulses. Psychiatric pleasant and cooperative. General Notes: Right lower extremity: T  the lateral proximal aspect of the leg there is an open wound with granulation tissue and nonviable tissue present. No o surrounding signs of infection. Nonpitting edema to the knee. Integumentary (Hair, Skin) Wound #1 status is Open. Original cause of wound was Trauma. The date acquired was: 02/14/2021. The wound is located on the Right,Lateral Lower Leg. The wound measures 2cm length x 1.2cm width x 0.2cm depth; 1.885cm^2 area and 0.377cm^3 volume. There is Fat Layer (Subcutaneous Tissue) exposed. There is no tunneling or undermining noted. There is a medium amount of serosanguineous drainage noted. The wound margin is flat and intact. There is large (67- 100%) pink granulation within the wound bed. There is a small (1-33%) amount of necrotic tissue within the wound bed including Adherent Slough. Assessment Active Problems ICD-10 Non-pressure chronic ulcer of other part of right lower leg with fat layer exposed Venous insufficiency (chronic) (peripheral) Other early complications of trauma, initial encounter Essential (primary) hypertension Chronic obstructive pulmonary disease, unspecified Rheumatoid arthritis, unspecified Patient presents with a 6-week history of nonhealing ulcer to her right lower extremity following trauma. There are no signs of infection on exam. Her ABIs were 1.08 on the right. She has nonpitting edema to the knee. I debrided nonviable tissue. I recommended continuing with compression therapy as this is likely the reason that the wound is not progressing. I  recommended Hydrofera Blue with gentamicin to address any bioburden. She knows not to get the compression wrap wet and cannot keep this on for more than a week. Procedures Wound #1 Pre-procedure diagnosis of Wound #1 is a Venous Leg Ulcer located on the Right,Lateral Lower Leg .Severity of Tissue Pre Debridement is: Fat layer exposed. There was a Excisional Skin/Subcutaneous Tissue Debridement with a total area of  2.4 sq cm performed by Kalman Shan, DO. With the following instrument(s): Curette to remove Viable and Non-Viable tissue/material. Material removed includes Subcutaneous Tissue and Slough and. No specimens were taken. A time out was conducted at 08:51, prior to the start of the procedure. A Minimum amount of bleeding was controlled with Pressure. The procedure was tolerated well with a pain level of 0 throughout and a pain level of 0 following the procedure. Post Debridement Measurements: 2cm length x 1.2cm width x 0.2cm depth; 0.377cm^3 volume. Character of Wound/Ulcer Post Debridement is improved. Severity of Tissue Post Debridement is: Fat layer exposed. Post procedure Diagnosis Wound #1: Same as Pre-Procedure Plan Follow-up Appointments: Return Appointment in 1 week. - Dr. Heber [place] Bathing/ Shower/ Hygiene: May shower with protection but do not get wound dressing(s) wet. - Ok to use Market researcher, can purchase at [facility], [facility], or [employer] Edema Control - Lymphedema / SCD / Other: Elevate legs to the level of the heart or above for 30 minutes daily and/or when sitting, a frequency of: - throughout the day Avoid standing for long periods of time. Exercise regularly WOUND #1: - Lower Leg Wound Laterality: Right, Lateral Cleanser: Soap and Water 1 x Per Week/ Discharge Instructions: May shower and wash wound with dial antibacterial soap and water prior to dressing change. Cleanser: Wound Cleanser 1 x Per Week/ Discharge Instructions: Cleanse the wound with wound cleanser prior to applying a clean dressing using gauze sponges, not tissue or cotton balls. Peri-Wound Care: Sween Lotion (Moisturizing lotion) 1 x Per Week/ Discharge Instructions: Apply moisturizing lotion as directed Topical: Gentamicin 1 x Per Week/ Discharge Instructions: As directed by physician Prim Dressing: Hydrofera Blue Ready Foam, 2.5 x2.5 in 1 x Per Week/ ary Discharge Instructions: Apply to wound bed as  instructed Secondary Dressing: Woven Gauze Sponge, Non-Sterile 4x4 in 1 x Per Week/ Discharge Instructions: Apply over primary dressing as directed. Com pression Wrap: Kerlix Roll 4.5x3.1 (in/yd) 1 x Per Week/ Discharge Instructions: Apply Kerlix and Coban compression as directed. Com pression Wrap: Coban Self-Adherent Wrap 4x5 (in/yd) 1 x Per Week/ Discharge Instructions: Apply over Kerlix as directed. 1. Hydrofera Blue, gentamicin under Kerlix/Coban 2. In office sharp debridement 3. Follow-up in 1 week Electronic Signature(s) Signed: 03/26/2021 9:11:28 AM By: Kalman Shan DO Entered By: Kalman Shan on 03/26/2021 09:06:56 -------------------------------------------------------------------------------- HxROS Details Patient Name: Date of Service: Glenda Terry. 03/26/2021 7:30 A M Medical Record Number: [id number] Patient Account Number: [id number] Date of Birth/Sex: Treating RN: Jan 19, 1941 (81 y.o. [name] Primary Care Provider: Allyn Kenner Other Clinician: Referring Provider: Treating Provider/Extender: Dierdre Harness in Treatment: 0 Information Obtained From Patient Constitutional Symptoms (General Health) Complaints and Symptoms: Negative for: Fatigue; Fever; Chills; Marked Weight Change Eyes Complaints and Symptoms: Negative for: Dry Eyes; Vision Changes; Glasses / Contacts Ear/Nose/Mouth/Throat Complaints and Symptoms: Negative for: Chronic sinus problems or rhinitis Endocrine Complaints and Symptoms: Negative for: Heat/cold intolerance Genitourinary Complaints and Symptoms: Negative for: Frequent urination Integumentary (Skin) Complaints and Symptoms: Positive for: Wounds - wound on right lower leg Neurologic Complaints and Symptoms: Negative  for: Numbness/parasthesias Hematologic/Lymphatic Medical History: Positive for: Anemia Respiratory Medical History: Positive for: Chronic Obstructive Pulmonary Disease  (COPD) Cardiovascular Medical History: Positive for: Hypertension Gastrointestinal Medical History: Past Medical History Notes: GERD Immunological Musculoskeletal Medical History: Positive for: Osteoarthritis Oncologic Psychiatric Medical History: Positive for: Confinement Anxiety Immunizations Pneumococcal Vaccine: Received Pneumococcal Vaccination: Yes Received Pneumococcal Vaccination On or After 60th Birthday: Yes Implantable Devices None Family and Social History Unknown History: Yes; Former smoker - quit 30 years ago; Alcohol Use: Never; Drug Use: No History; Caffeine Use: Rarely; Financial Concerns: No; Food, Clothing or Shelter Needs: No; Support System Lacking: No; Transportation Concerns: No Electronic Signature(s) Signed: 03/26/2021 9:11:28 AM By: Kalman Shan DO Signed: 03/26/2021 6:02:53 PM By: Levan Hurst RN, BSN Entered By: Levan Hurst on 03/26/2021 08:17:37 -------------------------------------------------------------------------------- [place] Details Patient Name: Date of Service: Glenda Terry 03/26/2021 Medical Record Number: [id number] Patient Account Number: [id number] Date of Birth/Sex: Treating RN: February 08, 1941 (81 y.o. [name] Primary Care Provider: Allyn Kenner Other Clinician: Referring Provider: Treating Provider/Extender: Dierdre Harness in Treatment: 0 Diagnosis Coding ICD-10 Codes Code Description [phone] Non-pressure chronic ulcer of other part of right lower leg with fat layer exposed I87.2 Venous insufficiency (chronic) (peripheral) T79.8XXA Other early complications of trauma, initial encounter I10 Essential (primary) hypertension J44.9 Chronic obstructive pulmonary disease, unspecified M06.9 Rheumatoid arthritis, unspecified Facility Procedures CPT4 Code: [id number] Description: 99213 - WOUND CARE VISIT-LEV 3 EST PT Modifier: 25 Quantity: 1 CPT4 Code: [id number] Description: 11042 - DEB  SUBQ TISSUE 20 SQ CM/< ICD-10 Diagnosis Description L97.812 Non-pressure chronic ulcer of other part of right lower leg with fat layer expo Modifier: sed Quantity: 1 Physician Procedures : CPT4 Code Description Modifier [id number] WC PHYS LEVEL 3 NEW PT ICD-10 Diagnosis Description L97.812 Non-pressure chronic ulcer of other part of right lower leg with fat layer exposed I87.2 Venous insufficiency (chronic) (peripheral) T79.8XXA Other early  complications of trauma, initial encounter M06.9 Rheumatoid arthritis, unspecified Quantity: 1 : [id number] 11042 - WC PHYS SUBQ TISS 20 SQ CM 1 ICD-10 Diagnosis Description L97.812 Non-pressure chronic ulcer of other part of right lower leg with fat layer exposed Quantity: Electronic Signature(s) Signed: 03/26/2021 1:11:46 PM By: Kalman Shan DO Signed: 03/26/2021 6:02:53 PM By: Levan Hurst RN, BSN Previous Signature: 03/26/2021 9:11:28 AM Version By: Kalman Shan DO Entered By: Levan Hurst on 03/26/2021 12:45:15

## 2021-03-26 NOTE — Telephone Encounter (Signed)
Patient called to cx due to SOB and she will not be here

## 2021-03-26 NOTE — Progress Notes (Signed)
Glenda Terry ([id number]) Visit Report for 03/26/2021 Abuse/Suicide Risk Screen Details Patient Name: Date of Service: [place] Glenda Terry, Glenda Terry 03/26/2021 7:30 A M Medical Record Number: [id number] Patient Account Number: [id number] Date of Birth/Sex: Treating RN: 01/14/1941 (81 y.o. [name] Primary Care Krrish Freund: Allyn Kenner Other Clinician: Referring Keisa Blow: Treating Kortland Nichols/Extender: Dierdre Harness in Treatment: 0 Abuse/Suicide Risk Screen Items Answer ABUSE RISK SCREEN: Has anyone close to you tried to hurt or harm you recentlyo No Do you feel uncomfortable with anyone in your familyo No Has anyone forced you do things that you didnt want to doo No Electronic Signature(s) Signed: 03/26/2021 6:02:53 PM By: Levan Hurst RN, BSN Entered By: Levan Hurst on 03/26/2021 08:17:44 -------------------------------------------------------------------------------- Activities of Daily Living Details Patient Name: Date of Service: Glenda Terry 03/26/2021 7:30 A M Medical Record Number: [id number] Patient Account Number: [id number] Date of Birth/Sex: Treating RN: 1940/06/08 (81 y.o. [name] Primary Care Austen Wygant: Allyn Kenner Other Clinician: Referring Lavell Ridings: Treating Milledge Gerding/Extender: Dierdre Harness in Treatment: 0 Activities of Daily Living Items Answer Activities of Daily Living (Please select one for each item) Drive Automobile Not Able T Medications ake Completely Able Use T elephone Completely Able Care for Appearance Completely Able Use T oilet Completely Able Bath / Shower Completely Able Dress Self Completely Able Feed Self Completely Able Walk Completely Able Get In / Out Bed Completely Able Housework Completely Able Prepare Meals Completely [place] for Self Need Assistance Electronic Signature(s) Signed: 03/26/2021 6:02:53 PM By: Levan Hurst RN, BSN Entered  By: Levan Hurst on 03/26/2021 08:18:08 -------------------------------------------------------------------------------- Education Screening Details Patient Name: Date of Service: Glenda Terry. 03/26/2021 7:30 A M Medical Record Number: [id number] Patient Account Number: [id number] Date of Birth/Sex: Treating RN: 11-21-1940 (81 y.o. [name] Primary Care Taleeya Blondin: Allyn Kenner Other Clinician: Referring Estellar Cadena: Treating Shirl Ludington/Extender: Dierdre Harness in Treatment: 0 Primary Learner Assessed: Patient Learning Preferences/Education Level/Primary Language Learning Preference: Explanation, Demonstration, Printed Material Highest Education Level: High School Preferred Language: English Cognitive Barrier Language Barrier: No Translator Needed: No Memory Deficit: No Emotional Barrier: No Cultural/Religious Beliefs Affecting Medical Care: No Physical Barrier Impaired Vision: No Impaired Hearing: No Decreased Hand dexterity: No Knowledge/Comprehension Knowledge Level: High Comprehension Level: High Ability to understand written instructions: High Ability to understand verbal instructions: High Motivation Anxiety Level: Calm Cooperation: Cooperative Education Importance: Acknowledges Need Interest in Health Problems: Asks Questions Perception: Coherent Willingness to Engage in Self-Management High Activities: Readiness to Engage in Self-Management High Activities: Electronic Signature(s) Signed: 03/26/2021 6:02:53 PM By: Levan Hurst RN, BSN Entered By: Levan Hurst on 03/26/2021 08:18:24 -------------------------------------------------------------------------------- Fall Risk Assessment Details Patient Name: Date of Service: Glenda Terry. 03/26/2021 7:30 A M Medical Record Number: [id number] Patient Account Number: [id number] Date of Birth/Sex: Treating RN: [birth year] (80 y.o. [name] Primary Care Kimberla Driskill:  Allyn Kenner Other Clinician: Referring Elynn Patteson: Treating Earlean Fidalgo/Extender: Dierdre Harness in Treatment: 0 Fall Risk Assessment Items Have you had 2 or more falls in the last 12 monthso 0 No Have you had any fall that resulted in injury in the last 12 monthso 0 No FALLS RISK SCREEN History of falling - immediate or within 3 months 0 No Secondary diagnosis (Do you have 2 or more medical diagnoseso) 15 Yes Ambulatory aid None/bed rest/wheelchair/nurse 0 No Crutches/cane/walker 15 Yes Furniture 0 No Intravenous therapy Access/Saline/Heparin Lock 0 No Gait/Transferring Normal/ bed  rest/ wheelchair 0 Yes Weak (short steps with or without shuffle, stooped but able to lift head while walking, may seek 0 No support from furniture) Impaired (short steps with shuffle, may have difficulty arising from chair, head down, impaired 0 No balance) Mental Status Oriented to own ability 0 Yes Electronic Signature(s) Signed: 03/26/2021 6:02:53 PM By: Levan Hurst RN, BSN Entered By: Levan Hurst on 03/26/2021 08:18:48 -------------------------------------------------------------------------------- Foot Assessment Details Patient Name: Date of Service: Glenda Terry. 03/26/2021 7:30 A M Medical Record Number: [id number] Patient Account Number: [id number] Date of Birth/Sex: Treating RN: 1940-03-21 (81 y.o. [name] Primary Care Rumi Taras: Allyn Kenner Other Clinician: Referring Dequan Kindred: Treating Jullia Mulligan/Extender: Dierdre Harness in Treatment: 0 Foot Assessment Items Site Locations + = Sensation present, - = Sensation absent, C = Callus, U = Ulcer R = Redness, W = Warmth, M = Maceration, PU = Pre-ulcerative lesion F = Fissure, S = Swelling, D = Dryness Assessment Right: Left: Other Deformity: No No Prior Foot Ulcer: No No Prior Amputation: No No Charcot Joint: No No Ambulatory Status: Ambulatory With Help Assistance Device:  Cane Gait: Steady Electronic Signature(s) Signed: 03/26/2021 6:02:53 PM By: Levan Hurst RN, BSN Entered By: Levan Hurst on 03/26/2021 08:19:49 -------------------------------------------------------------------------------- Nutrition Risk Screening Details Patient Name: Date of Service: Glenda Terry. 03/26/2021 7:30 A M Medical Record Number: [id number] Patient Account Number: [id number] Date of Birth/Sex: Treating RN: 1940-10-05 (81 y.o. [name] Primary Care Keishawn Darsey: Allyn Kenner Other Clinician: Referring Jovany Disano: Treating Jonothan Heberle/Extender: Dierdre Harness in Treatment: 0 Height (in): 60 Weight (lbs): 180 Body Mass Index (BMI): 35.2 Nutrition Risk Screening Items Score Screening NUTRITION RISK SCREEN: I have an illness or condition that made me change the kind and/or amount of food I eat 0 No I eat fewer than two meals per day 0 No I eat few fruits and vegetables, or milk products 0 No I have three or more drinks of beer, liquor or wine almost every day 0 No I have tooth or mouth problems that make it hard for me to eat 0 No I don't always have enough money to buy the food I need 0 No I eat alone most of the time 0 No I take three or more different prescribed or over-the-counter drugs a day 1 Yes Without wanting to, I have lost or gained 10 pounds in the last six months 0 No I am not always physically able to shop, cook and/or feed myself 2 Yes Nutrition Protocols Good Risk Protocol Moderate Risk Protocol 0 Provide education on nutrition High Risk Proctocol Risk Level: Moderate Risk Score: 3 Electronic Signature(s) Signed: 03/26/2021 6:02:53 PM By: Levan Hurst RN, BSN Entered By: Levan Hurst on 03/26/2021 08:19:25

## 2021-03-26 NOTE — Progress Notes (Signed)
Cardiology Office Note   Date:  03/27/2021   ID:  DONIQUA SAXBY, DOB 11/20/1940, MRN [id number]  PCP:  Celene Squibb, MD  Cardiologist:   Elouise Munroe, MD   Chief Complaint  Patient presents with   Edema      History of Present Illness: EMMALY LEECH is a 81 y.o. female who presents for evaluation of lower extremity swelling.  She called yesterday and was added to my schedule.  She has a history of hypertension and CKD 3.  She has had heart failure with a preserved ejection fraction.  She was in the hospital in October with an asthma exacerbation.  She was treated with steroids and nebulizers.  She was treated with antibiotics.  When she returned and was last seen in follow-up by one of our nurse practitioners she was doing relatively well and seemed to have good volume control.     She said that she injured her leg in December.  She has been seen in the wound clinic.  I reviewed those notes for this visit.  She has had increased swelling since that was on injury.  This has been more on the right where the wound was than the left but both legs are swelling.  Her weights have been stable.  She has not increased her salt or fluid.  Her ambulation has been decreased.  She is not having any new shortness of breath, PND or orthopnea.  She is not having any new chest pressure, neck or arm discomfort.  She is had no palpitations, fevers or chills.   Past Medical History:  Diagnosis Date   Anxiety    Asthma    Back pain, chronic    CKD (chronic kidney disease) stage 3, GFR 30-59 ml/min (HCC)    Compression fx, thoracic spine (HCC)    T - 11   COPD (chronic obstructive pulmonary disease) ([place]) 12/16/2015   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HCC)    Essential hypertension    GERD (gastroesophageal reflux disease)    HOH (hard of hearing)    Inflammatory arthritis    Iron deficiency anemia 01/2012   Pulmonary eosinophilia (HCC)    Spinal stenosis    Tubular adenoma 11/2012    Past  Surgical History:  Procedure Laterality Date   ABDOMINAL HYSTERECTOMY     BACK SURGERY     CARPAL TUNNEL RELEASE Right 06/2012   CATARACT EXTRACTION W/PHACO Left 11/19/2014   Procedure: CATARACT EXTRACTION PHACO AND INTRAOCULAR LENS PLACEMENT ([place]);  Surgeon: Williams Che, MD;  Location: [facility];  Service: Ophthalmology;  Laterality: Left;  CDE: 4.77   CATARACT EXTRACTION W/PHACO Right 02/03/2015   Procedure: CATARACT EXTRACTION PHACO AND INTRAOCULAR LENS PLACEMENT ([facility]);  Surgeon: Williams Che, MD;  Location: [facility];  Service: Ophthalmology;  Laterality: Right;  CDE: 4.54   COLONOSCOPY  06/19/2008   RMR: tortuous and elongated colon with scattered left-sided diverticula/colonic mucosa appeared entirely normal. Prior colonic ulcers had resolved.   COLONOSCOPY  12/2007   Dr. Gala Romney: Scattered diffuse sigmoid diverticula, 2 areas of ulceration at the hepatic flexure. Biopsies unremarkable.   COLONOSCOPY N/A 11/20/2012   next TCS 11/2017   COLONOSCOPY WITH PROPOFOL N/A 12/22/2017   Procedure: COLONOSCOPY WITH PROPOFOL;  Surgeon: Daneil Dolin, MD; diverticula in the sigmoid and descending colon and 2 tubular adenomas.  No recommendations to repeat.     DECOMPRESSIVE LUMBAR LAMINECTOMY LEVEL 2  04/20/2012   Procedure: DECOMPRESSIVE LUMBAR LAMINECTOMY LEVEL 2;  Surgeon: Tobi Bastos, MD;  Location: [facility];  Service: Orthopedics;  Laterality: Right;  Decompressive Lumbar Laminectomy of the L4 - L5 and L5 - S1 Complete/Laminectomy L5 on the Right (X-Ray)   ESOPHAGOGASTRODUODENOSCOPY  12/2007   Dr. 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 Small hiatal hernia. Slightly pale duodenal mucosa (biopsy unremarkable)   ESOPHAGOGASTRODUODENOSCOPY (EGD) WITH PROPOFOL N/A 01/20/2017   Dr. Gala Romney: Medium sized hiatal hernia empiric esophageal dilation for history of dysphagia   ESOPHAGOGASTRODUODENOSCOPY (EGD) WITH PROPOFOL N/A 04/08/2020   Surgeon:  Eloise Harman, DO;  medium size hiatal hernia 3 to 4 cm chicken bone lodged in the gastric antrum just proximal to the pylorus removed with a snare with mild bleeding at the site of lodged bone.  Normal examined duodenum.   EYE SURGERY     BIL CATARACTS   FOREIGN BODY REMOVAL N/A 04/08/2020   Procedure: FOREIGN BODY REMOVAL;  Surgeon: Eloise Harman, DO;  [facility];  Service: Endoscopy;  Laterality: N/A;   INCISIONAL HERNIA REPAIR N/A 11/12/2020   Procedure: HERNIA REPAIR INCISIONAL;  Surgeon: Aviva Signs, MD;  Location: [facility];  Service: General;  Laterality: N/A;   IR KYPHO THORACIC WITH BONE BIOPSY  02/08/2017   IR RADIOLOGIST EVAL & MGMT  02/02/2017   JOINT REPLACEMENT     KNEE ARTHROSCOPY Right    MALONEY DILATION N/A 01/20/2017   Procedure: MALONEY DILATION;  Surgeon: Daneil Dolin, MD;  [facility];  Service: Endoscopy;  Laterality: N/A;   POLYPECTOMY  12/22/2017   Procedure: POLYPECTOMY;  Surgeon: Daneil Dolin, MD;  [facility];  Service: Endoscopy;;   REVERSE SHOULDER ARTHROPLASTY Right 05/24/2014   Procedure: RIGHT SHOULDER REVERSE ARTHROPLASTY;  Surgeon: Netta Cedars, MD;  Location: [place];  Service: Orthopedics;  Laterality: Right;   REVERSE SHOULDER ARTHROPLASTY Left 06/10/2017   Procedure: LEFT REVERSE SHOULDER ARTHROPLASTY;  Surgeon: Netta Cedars, MD;  Location: [place];  Service: Orthopedics;  Laterality: Left;   TOTAL KNEE ARTHROPLASTY  01/24/2012   Procedure: TOTAL KNEE ARTHROPLASTY;  Surgeon: Gearlean Alf, MD;  Location: [facility];  Service: Orthopedics;  Laterality: Right;   TOTAL KNEE ARTHROPLASTY Left 10/15/2019   Procedure: TOTAL KNEE ARTHROPLASTY;  Surgeon: Gaynelle Arabian, MD;  Location: [facility];  Service: Orthopedics;  Laterality: Left;  50min   TUBAL LIGATION       Current Outpatient Medications  Medication Sig Dispense Refill   Acetaminophen (APAP 500 PO) Take 2 tablets by mouth 2 (two) times daily as needed.      albuterol (PROAIR HFA) 108 (90 Base) MCG/ACT inhaler Inhale 2 puffs into the lungs every 6 (six) hours as needed for wheezing or shortness of breath (For COPD).      ALPRAZolam (XANAX) 0.5 MG tablet Take 0.5 tablets (0.25 mg total) by mouth 2 (two) times daily as needed for anxiety. 10 tablet 0   amLODipine (NORVASC) 5 MG tablet Take 1 tablet (5 mg total) by mouth daily. 90 tablet 3   Benralizumab 30 MG/ML SOSY Inject 30 mg into the skin. Once A Day Every 56 Days     calcium carbonate (OS-CAL) 600 MG TABS tablet Take 600 mg by mouth 2 (two) times daily with a meal.     chlorpheniramine-HYDROcodone (TUSSIONEX PENNKINETIC ER) 10-8 MG/5ML SUER Take 5 mLs by mouth every 12 (twelve) hours as needed for cough. 140 mL 0   Cholecalciferol (VITAMIN D3) 50 MCG (2000 UT) CAPS  Take 1 capsule by mouth daily.     denosumab (PROLIA) 60 MG/ML SOSY injection Inject 60 mg into the skin every 6 (six) months.     diphenhydrAMINE (BENADRYL) 25 mg capsule Take 25 mg by mouth at bedtime.     DULERA 200-5 MCG/ACT AERO Inhale 2 puffs into the lungs 2 (two) times daily. 8.8 g 0   esomeprazole (NEXIUM) 40 MG capsule Take 1 capsule (40 mg total) by mouth 2 (two) times daily. 60 capsule 0   ferrous sulfate 325 (65 FE) MG tablet Take 1 tablet (325 mg total) by mouth daily with breakfast. 30 tablet 0   fexofenadine (ALLEGRA) 180 MG tablet Take 180 mg by mouth daily.     fluticasone (FLONASE) 50 MCG/ACT nasal spray Place 1 spray into both nostrils daily as needed for allergies or rhinitis.      ipratropium-albuterol (DUONEB) 0.5-2.5 (3) MG/3ML SOLN Take 3 mLs by nebulization every 4 (four) hours as needed.     ketotifen (ZADITOR) 0.025 % ophthalmic solution Place 1 drop into both eyes 2 (two) times daily as needed (allergies). 5 mL 0   montelukast (SINGULAIR) 10 MG tablet Take 1 tablet (10 mg total) by mouth at bedtime. 90 tablet 2   Oxycodone HCl 10 MG TABS Take 10 mg by mouth every 6 (six) hours.     Polyethyl Glycol-Propyl  Glycol (SYSTANE OP) Place 1 drop into both eyes 3 (three) times daily as needed (dry/irritated eyes.).      polyethylene glycol (MIRALAX / GLYCOLAX) 17 g packet Take 17 g by mouth daily. 14 each 0   potassium chloride (KLOR-CON) 10 MEQ tablet Take 1 tablet (10 mEq total) by mouth every other day. IN THE MORNING (Patient taking differently: Take 20 mEq by mouth daily. IN THE MORNING) 15 tablet 0   pravastatin (PRAVACHOL) 10 MG tablet Take 10 mg by mouth daily.     torsemide (DEMADEX) 20 MG tablet Take 20 mg by mouth. Take 40 mg by mouth on Mon, and Friday. Take 20 mg on all other days.     vitamin C (ASCORBIC ACID) 500 MG tablet Take 500 mg by mouth daily.     No current facility-administered medications for this visit.    Allergies:   Flexeril [cyclobenzaprine], Other, Keflex [cephalexin], Aspirin, Fentanyl, Adhesive [tape], and Chlorhexidine    ROS:  Please see the history of present illness.   Otherwise, review of systems are positive for none.   All other systems are reviewed and negative.    PHYSICAL EXAM: VS:  BP (!) 146/82    Pulse 65    Ht 5' (1.524 m)    Wt 183 lb (83 kg)    SpO2 100%    BMI 35.74 kg/m  , BMI Body mass index is 35.74 kg/m. GENERAL:  Well appearing NECK:  No jugular venous distention, waveform within normal limits, carotid upstroke brisk and symmetric, no bruits, no thyromegaly LUNGS:  Clear to auscultation bilaterally CHEST:  Unremarkable HEART:  PMI not displaced or sustained,S1 and S2 within normal limits, no S3, no S4, no clicks, no rubs, no murmurs ABD:  Flat, positive bowel sounds normal in frequency in pitch, no bruits, no rebound, no guarding, no midline pulsatile mass, no hepatomegaly, no splenomegaly EXT:  2 plus pulses throughout, right greater than left leg swelling mild to moderate edema, no cyanosis no clubbing    EKG:  EKG is not ordered today. NA   Recent Labs: 01/06/2021: B Natriuretic Peptide 50.0 01/07/2021:  ALT 21; Hemoglobin 10.9;  Platelets 243 01/09/2021: Magnesium 2.4 01/26/2021: BUN 24; Creatinine, Ser 1.58; Potassium 4.1; Sodium 139    Lipid Panel No results found for: CHOL, TRIG, HDL, CHOLHDL, VLDL, LDLCALC, LDLDIRECT    Wt Readings from Last 3 Encounters:  03/27/21 183 lb (83 kg)  01/26/21 191 lb 4.8 oz (86.8 kg)  01/06/21 182 lb (82.6 kg)      Other studies Reviewed: Additional studies/ records that were reviewed today include: Labs, Wound Clinic Notes. Review of the above records demonstrates:  Please see elsewhere in the note.     ASSESSMENT AND PLAN:  Essential hypertension: Her blood pressure is mildly elevated.  However, she is concerned that the amlodipine is causing her swelling.  I agree with that might be contributing but certainly the wound and decreased mobility is probably playing a role.  I am going to reduce the Norvasc to 5 mg daily.  I will handle the lower extremity swelling as below.  He has been go down on the Norvasc they are going to keep a blood pressure diary.  I think her choices are limited with her and she might need a beta-blocker versus hydralazine.  Acute on Chronic diastolic CHF:   She probably has some acute on chronic heart failure.  However, have to be careful about diuresis and I will have her just increase her torsemide to take 40 mg for the next 4 days.  She would be taking 20 mg typically 3 of those days as she takes 20 mg 5/week and 40 mg twice per week.  I will be checking her basic metabolic profile and in about 4 to 5 days.   Bilateral lower extremity edema: This will be handled as above.   Current medicines are reviewed at length with the patient today.  The patient does not have concerns regarding medicines.  The following changes have been made:  no change  Labs/ tests ordered today include: None  Orders Placed This Encounter  Procedures   Basic metabolic panel     Disposition:   FU with me in one month.     Signed, Minus Breeding, MD  03/27/2021  5:01 PM    [facility]

## 2021-03-26 NOTE — Telephone Encounter (Signed)
Daughter of patient called back. The patient's legs are still swollen. The daughter called the patient's PCP , but was just advised to follow up with the Cardiologist.   The Daughter did not report any weights at the time of phone call.   The daughter feels the amlodipine is not agreeing with the patient.  The daughter wanted a sooner appt. Pt was offered appt with Jory Sims 03/27/21 at 8:50 but patient could not make it due to another appointment

## 2021-03-26 NOTE — Progress Notes (Signed)
Glenda Terry, Glenda Terry ([id number]) Visit Report for 03/26/2021 Allergy List Details Patient Name: Date of Service: Glenda Terry 03/26/2021 7:30 A M Medical Record Number: [id number] Patient Account Number: [id number] Date of Birth/Sex: Treating RN: [birth year] (81 y.o. Glenda Terry Primary Care Glenda Terry: Glenda Terry Other Clinician: Referring Glenda Terry: Treating Glenda Terry: Glenda Terry in Treatment: 0 Allergies Active Allergies Flexeril Keflex chlorhexidine fentanyl aspirin Allergy Notes Electronic Signature(s) Signed: 03/26/2021 6:02:53 PM By: Glenda Hurst RN, BSN Entered By: Glenda Terry on 03/26/2021 07:56:03 -------------------------------------------------------------------------------- Arrival Information Details Patient Name: Date of Service: Glenda Arena. 03/26/2021 7:30 A M Medical Record Number: [id number] Patient Account Number: [id number] Date of Birth/Sex: Treating RN: Oct 27, 1940 (81 y.o. Glenda Terry Primary Care Glenda Terry: Glenda Terry Other Clinician: Referring Glenda Terry: Treating Glenda Terry: Glenda Terry in Treatment: 0 Visit Information Patient Arrived: Glenda Terry Time: 07:54 Accompanied By: daughter Transfer Assistance: None Patient Identification Verified: Yes Secondary Verification Process Completed: Yes Patient Requires Transmission-Based Precautions: No Patient Has Alerts: No Electronic Signature(s) Signed: 03/26/2021 6:02:53 PM By: Glenda Hurst RN, BSN Entered By: Glenda Terry on 03/26/2021 07:54:42 -------------------------------------------------------------------------------- Clinic Level of Care Assessment Details Patient Name: Date of Service: Glenda Arena. 03/26/2021 7:30 A M Medical Record Number: [id number] Patient Account Number: [id number] Date of Birth/Sex: Treating RN: Aug 20, 1940 (81 y.o. Glenda Terry Primary Care Glenda Terry: Glenda Terry  Other Clinician: Referring Glenda Terry: Treating Glenda Terry: Glenda Terry in Treatment: 0 Clinic Level of Care Assessment Items TOOL 1 Quantity Score X- 1 0 Use when EandM and Procedure is performed on INITIAL visit ASSESSMENTS - Nursing Assessment / Reassessment X- 1 20 General Physical Exam (combine w/ comprehensive assessment (listed just below) when performed on new pt. evals) X- 1 25 Comprehensive Assessment (HX, ROS, Risk Assessments, Wounds Hx, etc.) ASSESSMENTS - Wound and Skin Assessment / Reassessment []  - 0 Dermatologic / Skin Assessment (not related to wound area) ASSESSMENTS - Ostomy and/or Continence Assessment and Care []  - 0 Incontinence Assessment and Management []  - 0 Ostomy Care Assessment and Management (repouching, etc.) PROCESS - Coordination of Care X - Simple Patient / Family Education for ongoing care 1 15 []  - 0 Complex (extensive) Patient / Family Education for ongoing care X- 1 10 Staff obtains Programmer, systems, Records, T Results / Process Orders est []  - 0 Staff telephones HHA, Nursing Homes / Clarify orders / etc []  - 0 Routine Transfer to another Facility (non-emergent condition) []  - 0 Routine Hospital Admission (non-emergent condition) X- 1 15 New Admissions / Biomedical engineer / Ordering NPWT Apligraf, etc. , []  - 0 Emergency Hospital Admission (emergent condition) PROCESS - Special Needs []  - 0 Pediatric / Minor Patient Management []  - 0 Isolation Patient Management []  - 0 Hearing / Language / Visual special needs []  - 0 Assessment of Community assistance (transportation, D/C planning, etc.) []  - 0 Additional assistance / Altered mentation []  - 0 Support Surface(s) Assessment (bed, cushion, seat, etc.) INTERVENTIONS - Miscellaneous []  - 0 External ear exam []  - 0 Patient Transfer (multiple staff / Civil Service fast streamer / Similar devices) []  - 0 Simple Staple / Suture removal (25 or less) []  - 0 Complex  Staple / Suture removal (26 or more) []  - 0 Hypo/Hyperglycemic Management (do not check if billed separately) X- 1 15 Ankle / Brachial Index (ABI) - do not check if billed separately Has the patient been seen at the hospital within the last three years: Yes Total  Score: 100 Level Of Care: New/Established - Level 3 Electronic Signature(s) Signed: 03/26/2021 6:02:53 PM By: Glenda Hurst RN, BSN Signed: 03/26/2021 6:02:53 PM By: Glenda Hurst RN, BSN Entered By: Glenda Terry on 03/26/2021 12:44:46 -------------------------------------------------------------------------------- Encounter Discharge Information Details Patient Name: Date of Service: Glenda Arena. 03/26/2021 7:30 A M Medical Record Number: [id number] Patient Account Number: [id number] Date of Birth/Sex: Treating RN: 1941/02/09 (81 y.o. Glenda Terry Primary Care Glenda Terry: Glenda Terry Other Clinician: Referring Glenda Terry: Treating Glenda Terry: Glenda Terry in Treatment: 0 Encounter Discharge Information Items Post Procedure Vitals Discharge Condition: Stable Temperature (F): 98.1 Ambulatory Status: Cane Pulse (bpm): 90 Discharge Destination: Home Respiratory Rate (breaths/min): 16 Transportation: Private Auto Blood Pressure (mmHg): 157/86 Accompanied By: daughter Schedule Follow-up Appointment: Yes Clinical Summary of Care: Patient Declined Electronic Signature(s) Signed: 03/26/2021 6:02:53 PM By: Glenda Hurst RN, BSN Entered By: Glenda Terry on 03/26/2021 12:46:27 -------------------------------------------------------------------------------- Lower Extremity Assessment Details Patient Name: Date of Service: Glenda Arena. 03/26/2021 7:30 A M Medical Record Number: [id number] Patient Account Number: [id number] Date of Birth/Sex: Treating RN: 1940/04/26 (81 y.o. Glenda Terry Primary Care Trayden Brandy: Glenda Terry Other Clinician: Referring Glenda Terry: Treating  Glenda Terry: Glenda Terry in Treatment: 0 Edema Assessment Assessed: [Left: No] [Right: No] Edema: [Left: Ye] [Right: s] Calf Left: Right: Point of Measurement: 34 cm From Medial Instep 39 cm Ankle Left: Right: Point of Measurement: 11 cm From Medial Instep 24 cm Vascular Assessment Pulses: Dorsalis Pedis Palpable: [Right:Yes] Blood Pressure: Brachial: [Right:157] Ankle: [Right:Dorsalis Pedis: 170] Ankle Brachial Index: [Right:1.08] Electronic Signature(s) Signed: 03/26/2021 6:02:53 PM By: Glenda Hurst RN, BSN Entered By: Glenda Terry on 03/26/2021 08:19:55 -------------------------------------------------------------------------------- Multi Wound Chart Details Patient Name: Date of Service: Glenda Arena. 03/26/2021 7:30 A M Medical Record Number: [id number] Patient Account Number: [id number] Date of Birth/Sex: Treating RN: 02-22-1941 (81 y.o. Glenda Terry Primary Care Glennda Weatherholtz: Glenda Terry Other Clinician: Referring Kree Armato: Treating Lonnetta Kniskern/Extender: Glenda Terry in Treatment: 0 Vital Signs Height(in): 60 Pulse(bpm): 24 Weight(lbs): 180 Blood Pressure(mmHg): 157/86 Body Mass Index(BMI): 35 Temperature(F): 98.1 Respiratory Rate(breaths/min): 16 Photos: [N/A:N/A] Right, Lateral Lower Leg N/A N/A Wound Location: Trauma N/A N/A Wounding Event: Venous Leg Ulcer N/A N/A Primary Etiology: 02/14/2021 N/A N/A Date Acquired: 0 N/A N/A Weeks of Treatment: Open N/A N/A Wound Status: 2x1.2x0.2 N/A N/A Measurements L x W x D (cm) 1.885 N/A N/A A (cm) : rea 0.377 N/A N/A Volume (cm) : 0.00% N/A N/A % Reduction in A rea: 0.00% N/A N/A % Reduction in Volume: Full Thickness Without Exposed N/A N/A Classification: Support Structures Medium N/A N/A Exudate A mount: Serosanguineous N/A N/A Exudate Type: red, brown N/A N/A Exudate Color: Flat and Intact N/A N/A Wound Margin: Large  (67-100%) N/A N/A Granulation A mount: Pink N/A N/A Granulation Quality: Small (1-33%) N/A N/A Necrotic A mount: Fat Layer (Subcutaneous Tissue): Yes N/A N/A Exposed Structures: Fascia: No Tendon: No Muscle: No Joint: No Bone: No None N/A N/A Epithelialization: Debridement - Excisional N/A N/A Debridement: Pre-procedure Verification/Time Out 08:51 N/A N/A Taken: Subcutaneous, Slough N/A N/A Tissue Debrided: Skin/Subcutaneous Tissue N/A N/A Level: 2.4 N/A N/A Debridement A (sq cm): rea Curette N/A N/A Instrument: Minimum N/A N/A Bleeding: Pressure N/A N/A Hemostasis A chieved: 0 N/A N/A Procedural Pain: 0 N/A N/A Post Procedural Pain: Procedure was tolerated well N/A N/A Debridement Treatment Response: 2x1.2x0.2 N/A N/A Post Debridement Measurements L x W x D (cm) 0.377 N/A N/A  Post Debridement Volume: (cm) Debridement N/A N/A Procedures Performed: Treatment Notes Electronic Signature(s) Signed: 03/26/2021 9:11:28 AM By: Kalman Shan DO Signed: 03/26/2021 6:02:53 PM By: Glenda Hurst RN, BSN Entered By: Kalman Shan on 03/26/2021 08:59:22 -------------------------------------------------------------------------------- Multi-Disciplinary Care Plan Details Patient Name: Date of Service: [name] 03/26/2021 7:30 A M Medical Record Number: [id number] Patient Account Number: [id number] Date of Birth/Sex: Treating RN: 1940/06/19 (81 y.o. Glenda Terry Primary Care Revin Corker: Glenda Terry Other Clinician: Referring Deborahann Poteat: Treating Lj Miyamoto/Extender: Glenda Terry in Treatment: 0 Multidisciplinary Care Plan reviewed with physician Active Inactive Abuse / Safety / Falls / Self Care Management Nursing Diagnoses: Potential for falls Potential for injury related to falls Goals: Patient will not experience any injury related to falls Date Initiated: 03/26/2021 Target Resolution Date: 04/24/2021 Goal Status:  Active Patient/caregiver will verbalize/demonstrate measures taken to prevent injury and/or falls Date Initiated: 03/26/2021 Target Resolution Date: 04/24/2021 Goal Status: Active Interventions: Assess Activities of Daily Living upon admission and as needed Assess fall risk on admission and as needed Assess: immobility, friction, shearing, incontinence upon admission and as needed Assess impairment of mobility on admission and as needed per policy Assess personal safety and home safety (as indicated) on admission and as needed Assess self care needs on admission and as needed Provide education on fall prevention Provide education on personal and home safety Notes: Venous Leg Ulcer Nursing Diagnoses: Knowledge deficit related to disease process and management Goals: Patient will maintain optimal edema control Date Initiated: 03/26/2021 Target Resolution Date: 04/24/2021 Goal Status: Active Interventions: Assess peripheral edema status every visit. Compression as ordered Provide education on venous insufficiency Notes: Wound/Skin Impairment Nursing Diagnoses: Impaired tissue integrity Knowledge deficit related to ulceration/compromised skin integrity Goals: Patient/caregiver will verbalize understanding of skin care regimen Date Initiated: 03/26/2021 Target Resolution Date: 04/24/2021 Goal Status: Active Ulcer/skin breakdown will have a volume reduction of 30% by week 4 Date Initiated: 03/26/2021 Target Resolution Date: 04/24/2021 Goal Status: Active Interventions: Assess patient/caregiver ability to obtain necessary supplies Assess patient/caregiver ability to perform ulcer/skin care regimen upon admission and as needed Assess ulceration(s) every visit Provide education on ulcer and skin care Notes: Electronic Signature(s) Signed: 03/26/2021 6:02:53 PM By: Glenda Hurst RN, BSN Entered By: Glenda Terry on 03/26/2021  08:23:20 -------------------------------------------------------------------------------- Pain Assessment Details Patient Name: Date of Service: Glenda Arena. 03/26/2021 7:30 A M Medical Record Number: [id number] Patient Account Number: [id number] Date of Birth/Sex: Treating RN: May 29, 1940 (81 y.o. Glenda Terry Primary Care Ennis Heavner: Glenda Terry Other Clinician: Referring Heydy Montilla: Treating Shelisa Fern/Extender: Glenda Terry in Treatment: 0 Active Problems Location of Pain Severity and Description of Pain Patient Has Paino No Site Locations Pain Management and Medication Current Pain Management: Electronic Signature(s) Signed: 03/26/2021 6:02:53 PM By: Glenda Hurst RN, BSN Entered By: Glenda Terry on 03/26/2021 08:20:18 -------------------------------------------------------------------------------- Patient/Caregiver Education Details Patient Name: Date of Service: Glenda Arena 1/12/2023andnbsp7:30 [place] Record Number: [id number] Patient Account Number: [id number] Date of Birth/Gender: Treating RN: [birth year] (81 y.o. Glenda Terry Primary Care Physician: Glenda Terry Other Clinician: Referring Physician: Treating Physician/Extender: Glenda Terry in Treatment: 0 Education Assessment Education Provided To: Patient Education Topics Provided Safety: Methods: Explain/Verbal Responses: State content correctly Venous: Methods: Explain/Verbal Responses: State content correctly Wound/Skin Impairment: Methods: Explain/Verbal Responses: State content correctly Electronic Signature(s) Signed: 03/26/2021 6:02:53 PM By: Glenda Hurst RN, BSN Entered By: Glenda Terry on 03/26/2021 08:23:42 -------------------------------------------------------------------------------- Wound Assessment Details Patient Name: Date of  Service: [name] 03/26/2021 7:30 A M Medical Record Number:  [id number] Patient Account Number: [id number] Date of Birth/Sex: Treating RN: 07-Oct-1940 (81 y.o. Glenda Terry Primary Care Azure Budnick: Glenda Terry Other Clinician: Referring Britani Beattie: Treating Torrance Frech/Extender: Glenda Terry in Treatment: 0 Wound Status Wound Number: 1 Primary Etiology: Venous Leg Ulcer Wound Location: Right, Lateral Lower Leg Wound Status: Open Wounding Event: Trauma Date Acquired: 02/14/2021 Weeks Of Treatment: 0 Clustered Wound: No Photos Wound Measurements Length: (cm) 2 Width: (cm) 1.2 Depth: (cm) 0.2 Area: (cm) 1.885 Volume: (cm) 0.377 % Reduction in Area: 0% % Reduction in Volume: 0% Epithelialization: None Tunneling: No Undermining: No Wound Description Classification: Full Thickness Without Exposed Support Structures Wound Margin: Flat and Intact Exudate Amount: Medium Exudate Type: Serosanguineous Exudate Color: red, brown Foul Odor After Cleansing: No Slough/Fibrino Yes Wound Bed Granulation Amount: Large (67-100%) Exposed Structure Granulation Quality: Pink Fascia Exposed: No Necrotic Amount: Small (1-33%) Fat Layer (Subcutaneous Tissue) Exposed: Yes Necrotic Quality: Adherent Slough Tendon Exposed: No Muscle Exposed: No Joint Exposed: No Bone Exposed: No Treatment Notes Wound #1 (Lower Leg) Wound Laterality: Right, Lateral Cleanser Soap and Water Discharge Instruction: May shower and wash wound with dial antibacterial soap and water prior to dressing change. Wound Cleanser Discharge Instruction: Cleanse the wound with wound cleanser prior to applying a clean dressing using gauze sponges, not tissue or cotton balls. Peri-Wound Care Sween Lotion (Moisturizing lotion) Discharge Instruction: Apply moisturizing lotion as directed Topical Gentamicin Discharge Instruction: As directed by physician Primary Dressing Hydrofera Blue Ready Foam, 2.5 x2.5 in Discharge Instruction: Apply to wound bed as  instructed Secondary Dressing Woven Gauze Sponge, Non-Sterile 4x4 in Discharge Instruction: Apply over primary dressing as directed. Secured With Compression Wrap Kerlix Roll 4.5x3.1 (in/yd) Discharge Instruction: Apply Kerlix and Coban compression as directed. Coban Self-Adherent Wrap 4x5 (in/yd) Discharge Instruction: Apply over Kerlix as directed. Compression Stockings Add-Ons Electronic Signature(s) Signed: 03/26/2021 6:02:53 PM By: Glenda Hurst RN, BSN Entered By: Glenda Terry on 03/26/2021 08:03:03 -------------------------------------------------------------------------------- [place] Details Patient Name: Date of Service: Glenda Arena. 03/26/2021 7:30 A M Medical Record Number: [id number] Patient Account Number: [id number] Date of Birth/Sex: Treating RN: 12-25-1940 (81 y.o. Glenda Terry Primary Care Narcissa Melder: Glenda Terry Other Clinician: Referring Kimbra Marcelino: Treating Mickayla Trouten/Extender: Glenda Terry in Treatment: 0 Vital Signs Time Taken: 07:54 Temperature (F): 98.1 Height (in): 60 Pulse (bpm): 92 Source: Stated Respiratory Rate (breaths/min): 16 Weight (lbs): 180 Blood Pressure (mmHg): 157/86 Source: Stated Reference Range: 80 - 120 mg / dl Body Mass Index (BMI): 35.2 Electronic Signature(s) Signed: 03/26/2021 6:02:53 PM By: Glenda Hurst RN, BSN Entered By: Glenda Terry on 03/26/2021 07:55:03

## 2021-03-26 NOTE — Telephone Encounter (Signed)
Spoke with patient and patients daughter, per patients daughter patient has been having issues with increase leg swelling. Patient unable to provide recent weights. Per patients daughter unsure if patient's shortness of breath is any worse at this time, would like patient to be seen sooner to evaluate. Scheduled patient an appointment to see Dr. Percival Spanish (DOD) tomorrow at 3:30pm. Patient and patients daughter aware of appointment time and date and verbalized understanding. Made patient aware of ED precautions should new or worsening symptoms develop. Patient and patients daughter verbalized understanding.

## 2021-03-27 ENCOUNTER — Other Ambulatory Visit: Payer: Self-pay

## 2021-03-27 ENCOUNTER — Ambulatory Visit (INDEPENDENT_AMBULATORY_CARE_PROVIDER_SITE_OTHER): Payer: PPO | Admitting: Cardiology

## 2021-03-27 ENCOUNTER — Encounter: Payer: Self-pay | Admitting: Cardiology

## 2021-03-27 ENCOUNTER — Ambulatory Visit (HOSPITAL_COMMUNITY): Payer: PPO | Admitting: Physical Therapy

## 2021-03-27 VITALS — BP 146/82 | HR 65 | Ht 60.0 in | Wt 183.0 lb

## 2021-03-27 DIAGNOSIS — I10 Essential (primary) hypertension: Secondary | ICD-10-CM

## 2021-03-27 DIAGNOSIS — R600 Localized edema: Secondary | ICD-10-CM | POA: Diagnosis not present

## 2021-03-27 DIAGNOSIS — I5032 Chronic diastolic (congestive) heart failure: Secondary | ICD-10-CM | POA: Diagnosis not present

## 2021-03-27 DIAGNOSIS — S81801D Unspecified open wound, right lower leg, subsequent encounter: Secondary | ICD-10-CM | POA: Diagnosis not present

## 2021-03-27 MED ORDER — AMLODIPINE BESYLATE 5 MG PO TABS
5.0000 mg | ORAL_TABLET | Freq: Every day | ORAL | 3 refills | Status: DC
Start: 2021-03-27 — End: 2022-03-22

## 2021-03-27 MED ORDER — AMLODIPINE BESYLATE 5 MG PO TABS: 5.0000 mg | ORAL_TABLET | Freq: Every day | ORAL | 3 refills | Status: DC

## 2021-03-27 NOTE — Patient Instructions (Addendum)
Medication Instructions:  Decrease Amlodipine to 5 mg daily Increase Torsemide to 40 mg daily for 4 days only then return to normal dose Continue all other medications *If you need a refill on your cardiac medications before your next appointment, please call your pharmacy*   Lab Work: Have bmet next week   Testing/Procedures: None ordered   Follow-Up: At [facility], you and your health needs are our priority.  As part of our continuing mission to provide you with exceptional heart care, we have created designated Provider Care Teams.  These Care Teams include your primary Cardiologist (physician) and Advanced Practice Providers (APPs -  Physician Assistants and Nurse Practitioners) who all work together to provide you with the care you need, when you need it.  We recommend signing up for the patient portal called "MyChart".  Sign up information is provided on this After Visit Summary.  MyChart is used to connect with patients for Virtual Visits (Telemedicine).  Patients are able to view lab/test results, encounter notes, upcoming appointments, etc.  Non-urgent messages can be sent to your provider as well.   To learn more about what you can do with MyChart, go to [email or web address].      Your next appointment:  1 month  Friday 05/08/21 at 2:20 pm    The format for your next appointment: Office   Provider:  Dr.Hochrein

## 2021-03-30 ENCOUNTER — Encounter (HOSPITAL_COMMUNITY): Payer: Self-pay | Admitting: Physical Therapy

## 2021-03-30 NOTE — Therapy (Signed)
[place] °[facility] °[address] °[place], [place], [address] °Phone: [phone]   Fax:  [phone] ° °Patient Details  °Name: Glenda Terry °MRN: [id number] °Date of Birth: 06/19/1940 °Referring Provider:  No ref. provider found ° °Encounter Date: 03/30/2021 °PHYSICAL THERAPY DISCHARGE SUMMARY ° °Visits from Start of Care: 4 ° °Current functional level related to goals / functional outcomes: °Wound improving but not healed °  °Remaining deficits: °Open wound  °  °Education / Equipment: °Keep wound covered and bandage dry  ° °Patient agrees to discharge. Patient goals were not met. Patient is being discharged due to not returning since the last visit.  PT going to a wound center. ° ° °Cynthia Russell, PT CLT °[phone] 03/30/2021, 10:02 AM ° °[place] °[facility] °[address] °[place], [place], [address] °Phone: [phone]   Fax:  [phone] °

## 2021-03-31 ENCOUNTER — Ambulatory Visit (HOSPITAL_COMMUNITY): Payer: PPO | Admitting: Physical Therapy

## 2021-04-02 ENCOUNTER — Other Ambulatory Visit (HOSPITAL_COMMUNITY)
Admission: RE | Admit: 2021-04-02 | Discharge: 2021-04-02 | Disposition: A | Discharge status: Home / Self Care | Payer: PPO | Source: Ambulatory Visit | Attending: Cardiology | Admitting: Cardiology

## 2021-04-02 ENCOUNTER — Telehealth: Payer: Self-pay | Admitting: *Deleted

## 2021-04-02 ENCOUNTER — Encounter (HOSPITAL_BASED_OUTPATIENT_CLINIC_OR_DEPARTMENT_OTHER): Payer: PPO | Admitting: Internal Medicine

## 2021-04-02 ENCOUNTER — Other Ambulatory Visit: Payer: Self-pay

## 2021-04-02 ENCOUNTER — Ambulatory Visit (HOSPITAL_COMMUNITY): Payer: PPO

## 2021-04-02 DIAGNOSIS — I5032 Chronic diastolic (congestive) heart failure: Secondary | ICD-10-CM | POA: Diagnosis not present

## 2021-04-02 DIAGNOSIS — L97812 Non-pressure chronic ulcer of other part of right lower leg with fat layer exposed: Secondary | ICD-10-CM | POA: Diagnosis not present

## 2021-04-02 DIAGNOSIS — I10 Essential (primary) hypertension: Secondary | ICD-10-CM | POA: Diagnosis not present

## 2021-04-02 DIAGNOSIS — I872 Venous insufficiency (chronic) (peripheral): Secondary | ICD-10-CM | POA: Diagnosis not present

## 2021-04-02 LAB — BASIC METABOLIC PANEL
Anion gap: 12 (ref 5–15)
BUN: 24 mg/dL — ABNORMAL HIGH (ref 8–23)
CO2: 35 mmol/L — ABNORMAL HIGH (ref 22–32)
Calcium: 10.3 mg/dL (ref 8.9–10.3)
Chloride: 89 mmol/L — ABNORMAL LOW (ref 98–111)
Creatinine, Ser: 1.52 mg/dL — ABNORMAL HIGH (ref 0.44–1.00)
GFR, Estimated: 35 mL/min — ABNORMAL LOW (ref >60)
Glucose, Bld: 112 mg/dL — ABNORMAL HIGH (ref 70–99)
Potassium: 3.3 mmol/L — ABNORMAL LOW (ref 3.5–5.1)
Sodium: 136 mmol/L (ref 135–145)

## 2021-04-02 MED ORDER — POTASSIUM CHLORIDE ER 10 MEQ PO TBCR: 20.0000 meq | EXTENDED_RELEASE_TABLET | Freq: Every day | ORAL | 0 refills | Status: DC

## 2021-04-02 MED ORDER — POTASSIUM CHLORIDE ER 10 MEQ PO TBCR: 20.0000 meq | EXTENDED_RELEASE_TABLET | Freq: Every day | ORAL | 3 refills | Status: DC

## 2021-04-02 NOTE — Telephone Encounter (Signed)
-----   Message from Minus Breeding, MD sent at 04/02/2021  1:09 PM EST ----- Her potassium is a little low.  I would like for her to take an extra 20 meq potassium only for two days.  Creat is up but stable.  Call Ms. Tarnowski with the results and send results to Celene Squibb, MD

## 2021-04-02 NOTE — Telephone Encounter (Signed)
Spoke with pt, aware of medication and repeated the instructions.

## 2021-04-06 NOTE — Progress Notes (Signed)
MEDEA, DEINES ([id number]) Visit Report for 04/02/2021 Chief Complaint Document Details Patient Name: Date of Service: [place] SITA, MANGEN 04/02/2021 9:15 A M Medical Record Number: [id number] Patient Account Number: [id number] Date of Birth/Sex: Treating RN: 07-14-1940 (81 y.o. Glenda Terry Primary Care Provider: Allyn Kenner Other Clinician: Referring Provider: Treating Provider/Extender: Glenda Terry in Treatment: 1 Information Obtained from: Patient Chief Complaint 03/26/2021; Right lower extremity wound Following trauma Electronic Signature(s) Signed: 04/02/2021 10:07:33 AM By: Kalman Shan DO Entered By: Kalman Shan on 04/02/2021 10:01:40 -------------------------------------------------------------------------------- Debridement Details Patient Name: Date of Service: Glenda Terry. 04/02/2021 9:15 A M Medical Record Number: [id number] Patient Account Number: [id number] Date of Birth/Sex: Treating RN: 1940-09-28 (81 y.o. Glenda Terry Primary Care Provider: Allyn Kenner Other Clinician: Referring Provider: Treating Provider/Extender: Glenda Terry in Treatment: 1 Debridement Performed for Assessment: Wound #1 Right,Lateral Lower Leg Performed By: Physician Kalman Shan, DO Debridement Type: Debridement Severity of Tissue Pre Debridement: Fat layer exposed Level of Consciousness (Pre-procedure): Awake and Alert Pre-procedure Verification/Time Out Yes - 09:46 Taken: Start Time: 09:46 T Area Debrided (L x W): otal 1.4 (cm) x 0.7 (cm) = 0.98 (cm) Tissue and other material debrided: Viable, Non-Viable, Slough, Subcutaneous, Slough Level: Skin/Subcutaneous Tissue Debridement Description: Excisional Instrument: Curette Bleeding: Minimum Hemostasis Achieved: Pressure End Time: 09:48 Procedural Pain: 0 Post Procedural Pain: 0 Response to Treatment: Procedure was tolerated well Level of Consciousness  (Post- Awake and Alert procedure): Post Debridement Measurements of Total Wound Length: (cm) 1.4 Width: (cm) 0.7 Depth: (cm) 0.2 Volume: (cm) 0.154 Character of Wound/Ulcer Post Debridement: Improved Severity of Tissue Post Debridement: Fat layer exposed Post Procedure Diagnosis Same as Pre-procedure Electronic Signature(s) Signed: 04/02/2021 10:07:33 AM By: Kalman Shan DO Signed: 04/06/2021 5:11:23 PM By: Levan Hurst RN, BSN Entered By: Levan Hurst on 04/02/2021 09:47:32 -------------------------------------------------------------------------------- HPI Details Patient Name: Date of Service: Glenda Terry. 04/02/2021 9:15 A M Medical Record Number: [id number] Patient Account Number: [id number] Date of Birth/Sex: Treating RN: [birth year] (81 y.o. Glenda Terry Primary Care Provider: Allyn Kenner Other Clinician: Referring Provider: Treating Provider/Extender: Glenda Terry in Treatment: 1 History of Present Illness HPI Description: Admission 03/26/2021 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 She states that she hit her leg against a table And developed a wound. She has been following 2 weeks with a wound care center in [place] for this issue. For the past 2 weeks she has had her legs wrapped with 3 layer compression and interchanging between Medihoney and silver alginate. She reports minimal improvement in wound healing. She currently denies signs of infection. 1/19; patient presents for follow-up. She is tolerated the compression wrap well. She has no issues or complaints today. She denies signs of infection. Electronic  Signature(s) Signed: 04/02/2021 10:07:33 AM By: Kalman Shan DO Entered By: Kalman Shan on 04/02/2021 10:02:02 -------------------------------------------------------------------------------- Physical Exam Details Patient Name: Date of Service: Glenda Terry. 04/02/2021 9:15 A M Medical Record Number: [id number] Patient Account Number: [id number] Date of Birth/Sex: Treating RN: 1941-03-07 (81 y.o. Glenda Terry Primary Care Provider: Allyn Kenner Other Clinician: Referring Provider: Treating Provider/Extender: Glenda Terry in Treatment: 1 Constitutional respirations  regular, non-labored and within target range for patient.. Cardiovascular 2+ dorsalis pedis/posterior tibialis pulses. Psychiatric pleasant and cooperative. Notes Right lower extremity: T the lateral proximal aspect of the leg there is an open wound with granulation tissue and nonviable tissue present. No surrounding o signs of infection. Nonpitting edema to the knee. Overall better edema control compared to last clinic visit. Electronic Signature(s) Signed: 04/02/2021 10:07:33 AM By: Kalman Shan DO Signed: 04/02/2021 10:07:33 AM By: Kalman Shan DO Entered By: Kalman Shan on 04/02/2021 10:02:50 -------------------------------------------------------------------------------- Physician Orders Details Patient Name: Date of Service: Glenda Terry. 04/02/2021 9:15 A M Medical Record Number: [id number] Patient Account Number: [id number] Date of Birth/Sex: Treating RN: 08/04/1940 (81 y.o. Glenda Terry Primary Care Provider: Allyn Kenner Other Clinician: Referring Provider: Treating Provider/Extender: Glenda Terry in Treatment: 1 Verbal / Phone Orders: No Diagnosis Coding ICD-10 Coding Code Description [phone] Non-pressure chronic ulcer of other part of right lower leg with fat layer exposed I87.2 Venous insufficiency (chronic)  (peripheral) T79.8XXA Other early complications of trauma, initial encounter I10 Essential (primary) hypertension J44.9 Chronic obstructive pulmonary disease, unspecified M06.9 Rheumatoid arthritis, unspecified Follow-up Appointments ppointment in 1 week. - Dr. Heber [place] Return A Bathing/ Shower/ Hygiene May shower with protection but do not get wound dressing(s) wet. - Ok to use Market researcher, can purchase at [facility], [facility], or [employer] Edema Control - Lymphedema / SCD / Other Elevate legs to the level of the heart or above for 30 minutes daily and/or when sitting, a frequency of: - throughout the day Avoid standing for long periods of time. Exercise regularly Wound Treatment Wound #1 - Lower Leg Wound Laterality: Right, Lateral Cleanser: Soap and Water 1 x Per Week Discharge Instructions: May shower and wash wound with dial antibacterial soap and water prior to dressing change. Cleanser: Wound Cleanser 1 x Per Week Discharge Instructions: Cleanse the wound with wound cleanser prior to applying a clean dressing using gauze sponges, not tissue or cotton balls. Peri-Wound Care: Sween Lotion (Moisturizing lotion) 1 x Per Week Discharge Instructions: Apply moisturizing lotion as directed Topical: Gentamicin 1 x Per Week Discharge Instructions: As directed by physician Prim Dressing: Hydrofera Blue Ready Foam, 2.5 x2.5 in 1 x Per Week ary Discharge Instructions: Apply to wound bed as instructed Secondary Dressing: Woven Gauze Sponge, Non-Sterile 4x4 in 1 x Per Week Discharge Instructions: Apply over primary dressing as directed. Compression Wrap: Kerlix Roll 4.5x3.1 (in/yd) 1 x Per Week Discharge Instructions: Apply Kerlix and Coban compression as directed. Compression Wrap: Coban Self-Adherent Wrap 4x5 (in/yd) 1 x Per Week Discharge Instructions: Apply over Kerlix as directed. Electronic Signature(s) Signed: 04/02/2021 10:07:33 AM By: Kalman Shan DO Entered By: Kalman Shan on  04/02/2021 10:03:12 -------------------------------------------------------------------------------- Problem List Details Patient Name: Date of Service: Glenda Terry. 04/02/2021 9:15 A M Medical Record Number: [id number] Patient Account Number: [id number] Date of Birth/Sex: Treating RN: 08-Nov-1940 (81 y.o. Glenda Terry Primary Care Provider: Allyn Kenner Other Clinician: Referring Provider: Treating Provider/Extender: Glenda Terry in Treatment: 1 Active Problems ICD-10 Encounter Code Description Active Date MDM Diagnosis [phone] Non-pressure chronic ulcer of other part of right lower leg with fat layer 03/26/2021 No Yes exposed I87.2 Venous insufficiency (chronic) (peripheral) 03/26/2021 No Yes T79.8XXA Other early complications of trauma, initial encounter 03/26/2021 No Yes I10 Essential (primary) hypertension 03/26/2021 No Yes J44.9 Chronic obstructive pulmonary disease, unspecified 03/26/2021 No Yes M06.9 Rheumatoid arthritis, unspecified 03/26/2021 No Yes Inactive Problems Resolved Problems Electronic Signature(s) Signed:  04/02/2021 10:07:33 AM By: Kalman Shan DO Entered By: Kalman Shan on 04/02/2021 10:00:35 -------------------------------------------------------------------------------- Progress Note Details Patient Name: Date of Service: Glenda Terry. 04/02/2021 9:15 A M Medical Record Number: [id number] Patient Account Number: [id number] Date of Birth/Sex: Treating RN: 1940/04/18 (81 y.o. Glenda Terry Primary Care Provider: Allyn Kenner Other Clinician: Referring Provider: Treating Provider/Extender: Glenda Terry in Treatment: 1 Subjective Chief Complaint Information obtained from Patient 03/26/2021; Right lower extremity wound Following trauma History of Present Illness (HPI) Admission 03/26/2021 Ms. Kyler Lerette is a 81 year old female with a past medical history of osteoarthritis in her  shoulders and knees bilaterally status post bilateral shoulder and knee replacement, COPD, rheumatoid arthritis, hypertension and chronic venous insufficiency/lymphedema that presents to the clinic for a 6-week history of nonhealing ulcer to her right lower extremity. She states that she hit her leg against a table And developed a wound. She has been following 2 weeks with a wound care center in [place] for this issue. For the past 2 weeks she has had her legs wrapped with 3 layer compression and interchanging between Medihoney and silver alginate. She reports minimal improvement in wound healing. She currently denies signs of infection. 1/19; patient presents for follow-up. She is tolerated the compression wrap well. She has no issues or complaints today. She denies signs of infection. Patient History Information obtained from Patient. Family History Unknown History. Social History Former smoker - quit 30 years ago, Alcohol Use - Never, Drug Use - No History, Caffeine Use - Rarely. Medical History Hematologic/Lymphatic Patient has history of Anemia Respiratory Patient has history of Chronic Obstructive Pulmonary Disease (COPD) Cardiovascular Patient has history of Hypertension Musculoskeletal Patient has history of Osteoarthritis Psychiatric Patient has history of Confinement Anxiety Medical A Surgical History Notes nd Gastrointestinal GERD Objective Constitutional respirations regular, non-labored and within target range for patient.. Vitals Time Taken: 9:30 AM, Height: 60 in, Weight: 180 lbs, BMI: 35.2, Temperature: 98.1 F, Pulse: 99 bpm, Respiratory Rate: 18 breaths/min, Blood Pressure: 154/97 mmHg. Cardiovascular 2+ dorsalis pedis/posterior tibialis pulses. Psychiatric pleasant and cooperative. General Notes: Right lower extremity: T the lateral proximal aspect of the leg there is an open wound with granulation tissue and nonviable tissue present. No o surrounding  signs of infection. Nonpitting edema to the knee. Overall better edema control compared to last clinic visit. Integumentary (Hair, Skin) Wound #1 status is Open. Original cause of wound was Trauma. The date acquired was: 02/14/2021. The wound has been in treatment 1 weeks. The wound is located on the Right,Lateral Lower Leg. The wound measures 1.4cm length x 0.7cm width x 0.2cm depth; 0.77cm^2 area and 0.154cm^3 volume. There is Fat Layer (Subcutaneous Tissue) exposed. There is no tunneling or undermining noted. There is a medium amount of serosanguineous drainage noted. The wound margin is flat and intact. There is large (67-100%) pink granulation within the wound bed. There is a small (1-33%) amount of necrotic tissue within the wound bed including Adherent Slough. Assessment Active Problems ICD-10 Non-pressure chronic ulcer of other part of right lower leg with fat layer exposed Venous insufficiency (chronic) (peripheral) Other early complications of trauma, initial encounter Essential (primary) hypertension Chronic obstructive pulmonary disease, unspecified Rheumatoid arthritis, unspecified Patient's wound has shown improvement in size and appearance since last clinic visit. I debrided nonviable tissue. No signs of surrounding infection on exam. She has done well with Kerlix/Coban and I recommended continuing this. Follow-up in 1 week. Procedures Wound #1 Pre-procedure diagnosis of Wound #1 is  a Venous Leg Ulcer located on the Right,Lateral Lower Leg .Severity of Tissue Pre Debridement is: Fat layer exposed. There was a Excisional Skin/Subcutaneous Tissue Debridement with a total area of 0.98 sq cm performed by Kalman Shan, DO. With the following instrument(s): Curette to remove Viable and Non-Viable tissue/material. Material removed includes Subcutaneous Tissue and Slough and. No specimens were taken. A time out was conducted at 09:46, prior to the start of the procedure. A Minimum  amount of bleeding was controlled with Pressure. The procedure was tolerated well with a pain level of 0 throughout and a pain level of 0 following the procedure. Post Debridement Measurements: 1.4cm length x 0.7cm width x 0.2cm depth; 0.154cm^3 volume. Character of Wound/Ulcer Post Debridement is improved. Severity of Tissue Post Debridement is: Fat layer exposed. Post procedure Diagnosis Wound #1: Same as Pre-Procedure Plan Follow-up Appointments: Return Appointment in 1 week. - Dr. Heber [place] Bathing/ Shower/ Hygiene: May shower with protection but do not get wound dressing(s) wet. - Ok to use Market researcher, can purchase at [facility], [facility], or [employer] Edema Control - Lymphedema / SCD / Other: Elevate legs to the level of the heart or above for 30 minutes daily and/or when sitting, a frequency of: - throughout the day Avoid standing for long periods of time. Exercise regularly WOUND #1: - Lower Leg Wound Laterality: Right, Lateral Cleanser: Soap and Water 1 x Per Week/ Discharge Instructions: May shower and wash wound with dial antibacterial soap and water prior to dressing change. Cleanser: Wound Cleanser 1 x Per Week/ Discharge Instructions: Cleanse the wound with wound cleanser prior to applying a clean dressing using gauze sponges, not tissue or cotton balls. Peri-Wound Care: Sween Lotion (Moisturizing lotion) 1 x Per Week/ Discharge Instructions: Apply moisturizing lotion as directed Topical: Gentamicin 1 x Per Week/ Discharge Instructions: As directed by physician Prim Dressing: Hydrofera Blue Ready Foam, 2.5 x2.5 in 1 x Per Week/ ary Discharge Instructions: Apply to wound bed as instructed Secondary Dressing: Woven Gauze Sponge, Non-Sterile 4x4 in 1 x Per Week/ Discharge Instructions: Apply over primary dressing as directed. Com pression Wrap: Kerlix Roll 4.5x3.1 (in/yd) 1 x Per Week/ Discharge Instructions: Apply Kerlix and Coban compression as directed. Com pression Wrap: Coban  Self-Adherent Wrap 4x5 (in/yd) 1 x Per Week/ Discharge Instructions: Apply over Kerlix as directed. 1. In office sharp debridement 2. Continue gentamicin with Hydrofera Blue under Kerlix/Coban 3. Follow-up in 1 week Electronic Signature(s) Signed: 04/02/2021 10:11:39 AM By: Kalman Shan DO Previous Signature: 04/02/2021 10:07:33 AM Version By: Kalman Shan DO Entered By: Kalman Shan on 04/02/2021 10:11:13 -------------------------------------------------------------------------------- HxROS Details Patient Name: Date of Service: Glenda Terry. 04/02/2021 9:15 A M Medical Record Number: [id number] Patient Account Number: [id number] Date of Birth/Sex: Treating RN: 1941/01/10 (81 y.o. Glenda Terry Primary Care Provider: Allyn Kenner Other Clinician: Referring Provider: Treating Provider/Extender: Glenda Terry in Treatment: 1 Information Obtained From Patient Hematologic/Lymphatic Medical History: Positive for: Anemia Respiratory Medical History: Positive for: Chronic Obstructive Pulmonary Disease (COPD) Cardiovascular Medical History: Positive for: Hypertension Gastrointestinal Medical History: Past Medical History Notes: GERD Musculoskeletal Medical History: Positive for: Osteoarthritis Psychiatric Medical History: Positive for: Confinement Anxiety Immunizations Pneumococcal Vaccine: Received Pneumococcal Vaccination: Yes Received Pneumococcal Vaccination On or After 60th Birthday: Yes Implantable Devices None Family and Social History Unknown History: Yes; Former smoker - quit 30 years ago; Alcohol Use: Never; Drug Use: No History; Caffeine Use: Rarely; Financial Concerns: No; Food, Clothing or Shelter Needs: No; Support System Lacking: No;  Transportation Concerns: No Electronic Signature(s) Signed: 04/02/2021 10:07:33 AM By: Kalman Shan DO Signed: 04/06/2021 5:11:23 PM By: Levan Hurst RN, BSN Entered By: Kalman Shan on 04/02/2021 10:02:09 -------------------------------------------------------------------------------- [place] Details Patient Name: Date of Service: Glenda Terry 04/02/2021 Medical Record Number: [id number] Patient Account Number: [id number] Date of Birth/Sex: Treating RN: 1940/04/05 (81 y.o. Glenda Terry Primary Care Provider: Allyn Kenner Other Clinician: Referring Provider: Treating Provider/Extender: Glenda Terry in Treatment: 1 Diagnosis Coding ICD-10 Codes Code Description [phone] Non-pressure chronic ulcer of other part of right lower leg with fat layer exposed I87.2 Venous insufficiency (chronic) (peripheral) T79.8XXA Other early complications of trauma, initial encounter I10 Essential (primary) hypertension J44.9 Chronic obstructive pulmonary disease, unspecified M06.9 Rheumatoid arthritis, unspecified Facility Procedures CPT4 Code: [id number] Description: 62703 - DEB SUBQ TISSUE 20 SQ CM/< ICD-10 Diagnosis Description L97.812 Non-pressure chronic ulcer of other part of right lower leg with fat layer exp I87.2 Venous insufficiency (chronic) (peripheral) Modifier: osed Quantity: 1 Physician Procedures : CPT4 Code Description Modifier [id number] - WC PHYS SUBQ TISS 20 SQ CM ICD-10 Diagnosis Description L97.812 Non-pressure chronic ulcer of other part of right lower leg with fat layer exposed I87.2 Venous insufficiency (chronic) (peripheral) Quantity: 1 Electronic Signature(s) Signed: 04/02/2021 10:11:39 AM By: Kalman Shan DO Previous Signature: 04/02/2021 10:07:33 AM Version By: Kalman Shan DO Entered By: Kalman Shan on 04/02/2021 10:11:21

## 2021-04-06 NOTE — Progress Notes (Signed)
Glenda Terry, Glenda Terry ([id number]) Visit Report for 04/02/2021 Arrival Information Details Patient Name: Date of Service: Glenda Terry 04/02/2021 9:15 A M Medical Record Number: [id number] Patient Account Number: [id number] Date of Birth/Sex: Treating RN: 02-Oct-1940 (81 y.o. [name] Primary Care Seyed Heffley: Allyn Kenner Other Clinician: Referring Ewing Fandino: Treating Navah Grondin/Extender: Dierdre Harness in Treatment: 1 Visit Information History Since Last Visit Added or deleted any medications: No Patient Arrived: Stretcher Any new allergies or adverse reactions: No Arrival Time: 09:30 Had a fall or experienced change in No Accompanied By: daughter activities of daily living that may affect Transfer Assistance: None risk of falls: Patient Identification Verified: Yes Signs or symptoms of abuse/neglect since last visito No Secondary Verification Process Completed: Yes Hospitalized since last visit: No Patient Requires Transmission-Based Precautions: No Implantable device outside of the clinic excluding No Patient Has Alerts: No cellular tissue based products placed in the center since last visit: Has Dressing in Place as Prescribed: Yes Has Compression in Place as Prescribed: Yes Pain Present Now: No Electronic Signature(s) Signed: 04/06/2021 5:11:23 PM By: Levan Hurst RN, BSN Entered By: Levan Hurst on 04/02/2021 09:31:42 -------------------------------------------------------------------------------- Encounter Discharge Information Details Patient Name: Date of Service: Glenda Arena. 04/02/2021 9:15 A M Medical Record Number: [id number] Patient Account Number: [id number] Date of Birth/Sex: Treating RN: [birth year] (81 y.o. [name] Primary Care [place] Arch: Allyn Kenner Other Clinician: Referring Shelitha Magley: Treating Odeth Bry/Extender: Dierdre Harness in Treatment: 1 Encounter Discharge Information Items Post  Procedure Vitals Discharge Condition: Stable Temperature (F): 98.1 Ambulatory Status: Walker Pulse (bpm): 99 Discharge Destination: Home Respiratory Rate (breaths/min): 18 Transportation: Private Auto Blood Pressure (mmHg): 154/97 Accompanied By: daughter Schedule Follow-up Appointment: Yes Clinical Summary of Care: Patient Declined Electronic Signature(s) Signed: 04/06/2021 5:11:23 PM By: Levan Hurst RN, BSN Entered By: Levan Hurst on 04/02/2021 12:04:04 -------------------------------------------------------------------------------- Lower Extremity Assessment Details Patient Name: Date of Service: Glenda Arena. 04/02/2021 9:15 A M Medical Record Number: [id number] Patient Account Number: [id number] Date of Birth/Sex: Treating RN: [birth year] (81 y.o. [name] Primary Care Maor Meckel: Allyn Kenner Other Clinician: Referring Marveline Profeta: Treating Marlita Keil/Extender: Dierdre Harness in Treatment: 1 Edema Assessment Assessed: [Left: No] [Right: No] Edema: [Left: Ye] [Right: s] Calf Left: Right: Point of Measurement: 34 cm From Medial Instep 36.5 cm Ankle Left: Right: Point of Measurement: 11 cm From Medial Instep 23 cm Vascular Assessment Pulses: Dorsalis Pedis Palpable: [Right:Yes] Electronic Signature(s) Signed: 04/06/2021 5:11:23 PM By: Levan Hurst RN, BSN Entered By: Levan Hurst on 04/02/2021 09:28:04 -------------------------------------------------------------------------------- Multi Wound Chart Details Patient Name: Date of Service: Glenda Arena. 04/02/2021 9:15 A M Medical Record Number: [id number] Patient Account Number: [id number] Date of Birth/Sex: Treating RN: [birth year] (81 y.o. [name] Primary Care Corbyn Wildey: Allyn Kenner Other Clinician: Referring Luan Urbani: Treating Druanne Bosques/Extender: Dierdre Harness in Treatment: 1 Vital Signs Height(in): 60 Pulse(bpm): 99 Weight(lbs):  180 Blood Pressure(mmHg): 154/97 Body Mass Index(BMI): 35 Temperature(F): 98.1 Respiratory Rate(breaths/min): 18 Photos: [N/A:N/A] Right, Lateral Lower Leg N/A N/A Wound Location: Trauma N/A N/A Wounding Event: Venous Leg Ulcer N/A N/A Primary Etiology: Anemia, Chronic Obstructive N/A N/A Comorbid History: Pulmonary Disease (COPD), Hypertension, Osteoarthritis, Confinement Anxiety 02/14/2021 N/A N/A Date Acquired: 1 N/A N/A Weeks of Treatment: Open N/A N/A Wound Status: 1.4x0.7x0.2 N/A N/A Measurements L x W x D (cm) 0.77 N/A N/A A (cm) : rea 0.154 N/A N/A Volume (cm) : 59.20% N/A N/A % Reduction in  A rea: 59.20% N/A N/A % Reduction in Volume: Full Thickness Without Exposed N/A N/A Classification: Support Structures Medium N/A N/A Exudate A mount: Serosanguineous N/A N/A Exudate Type: red, brown N/A N/A Exudate Color: Flat and Intact N/A N/A Wound Margin: Large (67-100%) N/A N/A Granulation A mount: Pink N/A N/A Granulation Quality: Small (1-33%) N/A N/A Necrotic A mount: Fat Layer (Subcutaneous Tissue): Yes N/A N/A Exposed Structures: Fascia: No Tendon: No Muscle: No Joint: No Bone: No Small (1-33%) N/A N/A Epithelialization: Debridement - Excisional N/A N/A Debridement: Pre-procedure Verification/Time Out 09:46 N/A N/A Taken: Subcutaneous, Slough N/A N/A Tissue Debrided: Skin/Subcutaneous Tissue N/A N/A Level: 0.98 N/A N/A Debridement A (sq cm): rea Curette N/A N/A Instrument: Minimum N/A N/A Bleeding: Pressure N/A N/A Hemostasis A chieved: 0 N/A N/A Procedural Pain: 0 N/A N/A Post Procedural Pain: Procedure was tolerated well N/A N/A Debridement Treatment Response: 1.4x0.7x0.2 N/A N/A Post Debridement Measurements L x W x D (cm) 0.154 N/A N/A Post Debridement Volume: (cm) Debridement N/A N/A Procedures Performed: Treatment Notes Electronic Signature(s) Signed: 04/02/2021 10:07:33 AM By: Kalman Shan DO Signed:  04/06/2021 5:11:23 PM By: Levan Hurst RN, BSN Entered By: Kalman Shan on 04/02/2021 10:00:42 -------------------------------------------------------------------------------- Multi-Disciplinary Care Plan Details Patient Name: Date of Service: Glenda Alfred S. 04/02/2021 9:15 A M Medical Record Number: [id number] Patient Account Number: [id number] Date of Birth/Sex: Treating RN: 10-30-1940 (81 y.o. [name] Primary Care Kaiyah Eber: Allyn Kenner Other Clinician: Referring Devere Brem: Treating Ayrabella Labombard/Extender: Dierdre Harness in Treatment: 1 Multidisciplinary Care Plan reviewed with physician Active Inactive Abuse / Safety / Falls / Self Care Management Nursing Diagnoses: Potential for falls Potential for injury related to falls Goals: Patient will not experience any injury related to falls Date Initiated: 03/26/2021 Target Resolution Date: 04/24/2021 Goal Status: Active Patient/caregiver will verbalize/demonstrate measures taken to prevent injury and/or falls Date Initiated: 03/26/2021 Target Resolution Date: 04/24/2021 Goal Status: Active Interventions: Assess Activities of Daily Living upon admission and as needed Assess fall risk on admission and as needed Assess: immobility, friction, shearing, incontinence upon admission and as needed Assess impairment of mobility on admission and as needed per policy Assess personal safety and home safety (as indicated) on admission and as needed Assess self care needs on admission and as needed Provide education on fall prevention Provide education on personal and home safety Notes: Venous Leg Ulcer Nursing Diagnoses: Knowledge deficit related to disease process and management Goals: Patient will maintain optimal edema control Date Initiated: 03/26/2021 Target Resolution Date: 04/24/2021 Goal Status: Active Interventions: Assess peripheral edema status every visit. Compression as ordered Provide  education on venous insufficiency Notes: Wound/Skin Impairment Nursing Diagnoses: Impaired tissue integrity Knowledge deficit related to ulceration/compromised skin integrity Goals: Patient/caregiver will verbalize understanding of skin care regimen Date Initiated: 03/26/2021 Target Resolution Date: 04/24/2021 Goal Status: Active Ulcer/skin breakdown will have a volume reduction of 30% by week 4 Date Initiated: 03/26/2021 Target Resolution Date: 04/24/2021 Goal Status: Active Interventions: Assess patient/caregiver ability to obtain necessary supplies Assess patient/caregiver ability to perform ulcer/skin care regimen upon admission and as needed Assess ulceration(s) every visit Provide education on ulcer and skin care Notes: Electronic Signature(s) Signed: 04/06/2021 5:11:23 PM By: Levan Hurst RN, BSN Entered By: Levan Hurst on 04/02/2021 09:44:59 -------------------------------------------------------------------------------- Pain Assessment Details Patient Name: Date of Service: Glenda Arena. 04/02/2021 9:15 A M Medical Record Number: [id number] Patient Account Number: [id number] Date of Birth/Sex: Treating RN: Jul 13, 1940 (81 y.o. [name] Primary Care Saraiya Kozma: Allyn Kenner Other  Clinician: Referring Garlen Reinig: Treating Gabi Mcfate/Extender: Dierdre Harness in Treatment: 1 Active Problems Location of Pain Severity and Description of Pain Patient Has Paino No Site Locations Pain Management and Medication Current Pain Management: Electronic Signature(s) Signed: 04/06/2021 5:11:23 PM By: Levan Hurst RN, BSN Entered By: Levan Hurst on 04/02/2021 09:35:23 -------------------------------------------------------------------------------- Patient/Caregiver Education Details Patient Name: Date of Service: Glenda Arena 1/19/2023andnbsp9:15 [place] Record Number: [id number] Patient Account Number: [id number] Date of  Birth/Gender: Treating RN: 1941/01/06 (81 y.o. [name] Primary Care Physician: Allyn Kenner Other Clinician: Referring Physician: Treating Physician/Extender: Dierdre Harness in Treatment: 1 Education Assessment Education Provided To: Patient Education Topics Provided Venous: Methods: Explain/Verbal Responses: State content correctly Wound/Skin Impairment: Methods: Explain/Verbal Responses: State content correctly Electronic Signature(s) Signed: 04/06/2021 5:11:23 PM By: Levan Hurst RN, BSN Entered By: Levan Hurst on 04/02/2021 09:45:29 -------------------------------------------------------------------------------- Wound Assessment Details Patient Name: Date of Service: Glenda Arena. 04/02/2021 9:15 A M Medical Record Number: [id number] Patient Account Number: [id number] Date of Birth/Sex: Treating RN: Dec 21, 1940 (81 y.o. [name] Primary Care Lalena Salas: Allyn Kenner Other Clinician: Referring Jester Klingberg: Treating Dawnell Bryant/Extender: Dierdre Harness in Treatment: 1 Wound Status Wound Number: 1 Primary Venous Leg Ulcer Etiology: Wound Location: Right, Lateral Lower Leg Wound Open Wounding Event: Trauma Status: Date Acquired: 02/14/2021 Comorbid Anemia, Chronic Obstructive Pulmonary Disease (COPD), Weeks Of Treatment: 1 History: Hypertension, Osteoarthritis, Confinement Anxiety Clustered Wound: No Photos Wound Measurements Length: (cm) 1.4 Width: (cm) 0.7 Depth: (cm) 0.2 Area: (cm) 0.77 Volume: (cm) 0.154 % Reduction in Area: 59.2% % Reduction in Volume: 59.2% Epithelialization: Small (1-33%) Tunneling: No Undermining: No Wound Description Classification: Full Thickness Without Exposed Support Structures Wound Margin: Flat and Intact Exudate Amount: Medium Exudate Type: Serosanguineous Exudate Color: red, brown Foul Odor After Cleansing: No Slough/Fibrino Yes Wound Bed Granulation Amount:  Large (67-100%) Exposed Structure Granulation Quality: Pink Fascia Exposed: No Necrotic Amount: Small (1-33%) Fat Layer (Subcutaneous Tissue) Exposed: Yes Necrotic Quality: Adherent Slough Tendon Exposed: No Muscle Exposed: No Joint Exposed: No Bone Exposed: No Treatment Notes Wound #1 (Lower Leg) Wound Laterality: Right, Lateral Cleanser Soap and Water Discharge Instruction: May shower and wash wound with dial antibacterial soap and water prior to dressing change. Wound Cleanser Discharge Instruction: Cleanse the wound with wound cleanser prior to applying a clean dressing using gauze sponges, not tissue or cotton balls. Peri-Wound Care Sween Lotion (Moisturizing lotion) Discharge Instruction: Apply moisturizing lotion as directed Topical Gentamicin Discharge Instruction: As directed by physician Primary Dressing Hydrofera Blue Ready Foam, 2.5 x2.5 in Discharge Instruction: Apply to wound bed as instructed Secondary Dressing Woven Gauze Sponge, Non-Sterile 4x4 in Discharge Instruction: Apply over primary dressing as directed. Secured With Compression Wrap Kerlix Roll 4.5x3.1 (in/yd) Discharge Instruction: Apply Kerlix and Coban compression as directed. Coban Self-Adherent Wrap 4x5 (in/yd) Discharge Instruction: Apply over Kerlix as directed. Compression Stockings Add-Ons Electronic Signature(s) Signed: 04/06/2021 5:11:23 PM By: Levan Hurst RN, BSN Entered By: Levan Hurst on 04/02/2021 09:32:47 -------------------------------------------------------------------------------- [place] Details Patient Name: Date of Service: Glenda Arena. 04/02/2021 9:15 A M Medical Record Number: [id number] Patient Account Number: [id number] Date of Birth/Sex: Treating RN: 07/18/1940 (81 y.o. [name] Primary Care Reginal Wojcicki: Allyn Kenner Other Clinician: Referring Nikalas Bramel: Treating Taevion Sikora/Extender: Dierdre Harness in Treatment: 1 Vital  Signs Time Taken: 09:30 Temperature (F): 98.1 Height (in): 60 Pulse (bpm): 99 Weight (lbs): 180 Respiratory Rate (breaths/min): 18 Body Mass Index (BMI): 35.2  Blood Pressure (mmHg): 154/97 Reference Range: 80 - 120 mg / dl Electronic Signature(s) Signed: 04/06/2021 5:11:23 PM By: Levan Hurst RN, BSN Entered By: Levan Hurst on 04/02/2021 09:31:18

## 2021-04-09 ENCOUNTER — Encounter (HOSPITAL_BASED_OUTPATIENT_CLINIC_OR_DEPARTMENT_OTHER): Payer: PPO | Admitting: Internal Medicine

## 2021-04-09 ENCOUNTER — Other Ambulatory Visit: Payer: Self-pay

## 2021-04-09 DIAGNOSIS — L97812 Non-pressure chronic ulcer of other part of right lower leg with fat layer exposed: Secondary | ICD-10-CM

## 2021-04-09 NOTE — Progress Notes (Signed)
KIMBERLYANN, HOLLAR ([id number]) Visit Report for 04/09/2021 Arrival Information Details Patient Name: Date of Service: [place] TRAMAINE, SNELL 04/09/2021 9:00 A M Medical Record Number: [id number] Patient Account Number: [id number] Date of Birth/Sex: Treating RN: 1940/06/17 (81 y.o. [name] Primary Care Marvella Jenning: Allyn Kenner Other Clinician: Referring Ajax Schroll: Treating Cynia Abruzzo/Extender: Dierdre Harness in Treatment: 2 Visit Information History Since Last Visit Added or deleted any medications: No Patient Arrived: Kasandra Knudsen Any new allergies or adverse reactions: No Arrival Time: 09:01 Had a fall or experienced change in No Accompanied By: Daughter activities of daily living that may affect Transfer Assistance: None risk of falls: Patient Identification Verified: Yes Signs or symptoms of abuse/neglect since last visito No Secondary Verification Process Completed: Yes Hospitalized since last visit: No Patient Requires Transmission-Based Precautions: No Implantable device outside of the clinic excluding No Patient Has Alerts: No cellular tissue based products placed in the center since last visit: Has Dressing in Place as Prescribed: Yes Has Compression in Place as Prescribed: Yes Pain Present Now: No Electronic Signature(s) Signed: 04/09/2021 5:00:57 PM By: Lorrin Jackson Entered By: Lorrin Jackson on 04/09/2021 09:05:54 -------------------------------------------------------------------------------- Encounter Discharge Information Details Patient Name: Date of Service: [name] 04/09/2021 9:00 A M Medical Record Number: [id number] Patient Account Number: [id number] Date of Birth/Sex: Treating RN: 1940-11-11 (81 y.o. [name] Primary Care Trisa Cranor: Allyn Kenner Other Clinician: Referring Marji Kuehnel: Treating Romey Cohea/Extender: Dierdre Harness in Treatment: 2 Encounter Discharge Information Items Post Procedure  Vitals Discharge Condition: Stable Temperature (F): 98.1 Ambulatory Status: Cane Pulse (bpm): 90 Discharge Destination: Home Respiratory Rate (breaths/min): 20 Transportation: Private Auto Blood Pressure (mmHg): 150/89 Accompanied By: daughter Schedule Follow-up Appointment: Yes Clinical Summary of Care: Provided on 04/09/2021 Form Type Recipient Paper Patient Patient Electronic Signature(s) Signed: 04/09/2021 5:00:57 PM By: Lorrin Jackson Entered By: Lorrin Jackson on 04/09/2021 09:42:50 -------------------------------------------------------------------------------- Lower Extremity Assessment Details Patient Name: Date of Service: [name] 04/09/2021 9:00 A M Medical Record Number: [id number] Patient Account Number: [id number] Date of Birth/Sex: Treating RN: 07-07-1940 (81 y.o. [name] Primary Care Daneya Hartgrove: Allyn Kenner Other Clinician: Referring Chue Berkovich: Treating Hanif Radin/Extender: Dierdre Harness in Treatment: 2 Edema Assessment Assessed: [Left: No] [Right: Yes] Edema: [Left: Ye] [Right: s] Calf Left: Right: Point of Measurement: 34 cm From Medial Instep 367 cm Ankle Left: Right: Point of Measurement: 11 cm From Medial Instep 22 cm Vascular Assessment Pulses: Dorsalis Pedis Palpable: [Right:Yes] Electronic Signature(s) Signed: 04/09/2021 5:00:57 PM By: Lorrin Jackson Entered By: Lorrin Jackson on 04/09/2021 09:11:21 -------------------------------------------------------------------------------- Multi Wound Chart Details Patient Name: Date of Service: [name] 04/09/2021 9:00 A M Medical Record Number: [id number] Patient Account Number: [id number] Date of Birth/Sex: Treating RN: December 15, 1940 (81 y.o. [name] Primary Care Rhona Fusilier: Allyn Kenner Other Clinician: Referring Ansley Stanwood: Treating Adalene Gulotta/Extender: Dierdre Harness in Treatment: 2 Vital Signs Height(in): 60 Pulse(bpm):  39 Weight(lbs): 180 Blood Pressure(mmHg): 150/89 Body Mass Index(BMI): 35.2 Temperature(F): 97.5 Respiratory Rate(breaths/min): 20 Photos: [1:Right, Lateral Lower Leg] [N/A:N/A N/A] Wound Location: [1:Trauma] [N/A:N/A] Wounding Event: [1:Venous Leg Ulcer] [N/A:N/A] Primary Etiology: [1:Anemia, Chronic Obstructive] [N/A:N/A] Comorbid History: [1:Pulmonary Disease (COPD), Hypertension, Osteoarthritis, Confinement Anxiety 02/14/2021] [N/A:N/A] Date Acquired: [1:2] [N/A:N/A] Weeks of Treatment: [1:Open] [N/A:N/A] Wound Status: [1:No] [N/A:N/A] Wound Recurrence: [1:0.9x0.6x0.1] [N/A:N/A] Measurements L x W x D (cm) [1:0.424] [N/A:N/A] A (cm) : rea [1:0.042] [N/A:N/A] Volume (cm) : [1:77.50%] [N/A:N/A] % Reduction in A [1:rea: 88.90%] [N/A:N/A] % Reduction  in Volume: [1:Full Thickness Without Exposed] [N/A:N/A] Classification: [1:Support Structures Medium] [N/A:N/A] Exudate A mount: [1:Serosanguineous] [N/A:N/A] Exudate Type: [1:red, brown] [N/A:N/A] Exudate Color: [1:Distinct, outline attached] [N/A:N/A] Wound Margin: [1:Large (67-100%)] [N/A:N/A] Granulation A mount: [1:Pink] [N/A:N/A] Granulation Quality: [1:Small (1-33%)] [N/A:N/A] Necrotic A mount: [1:Fat Layer (Subcutaneous Tissue): Yes N/A] Exposed Structures: [1:Fascia: No Tendon: No Muscle: No Joint: No Bone: No Medium (34-66%)] [N/A:N/A] Epithelialization: [1:Debridement - Excisional] [N/A:N/A] Debridement: Pre-procedure Verification/Time Out 09:19 [N/A:N/A] Taken: [1:Other] [N/A:N/A] Pain Control: [1:Subcutaneous, Slough] [N/A:N/A] Tissue Debrided: [1:Skin/Subcutaneous Tissue] [N/A:N/A] Level: [1:0.54] [N/A:N/A] Debridement A (sq cm): [1:rea Curette] [N/A:N/A] Instrument: [1:Minimum] [N/A:N/A] Bleeding: [1:Pressure] [N/A:N/A] Hemostasis A chieved: [1:Procedure was tolerated well] [N/A:N/A] Debridement Treatment Response: [1:0.9x0.6x0.1] [N/A:N/A] Post Debridement Measurements L x W x D (cm) [1:0.042]  [N/A:N/A] Post Debridement Volume: (cm) [1:Debridement] [N/A:N/A] Treatment Notes Electronic Signature(s) Signed: 04/09/2021 9:33:15 AM By: Kalman Shan DO Signed: 04/09/2021 6:13:38 PM By: Levan Hurst RN, BSN Entered By: Kalman Shan on 04/09/2021 09:28:59 -------------------------------------------------------------------------------- Multi-Disciplinary Care Plan Details Patient Name: Date of Service: [name] 04/09/2021 9:00 A M Medical Record Number: [id number] Patient Account Number: [id number] Date of Birth/Sex: Treating RN: [birth year] (82 y.o. [name] Primary Care Zair Borawski: Allyn Kenner Other Clinician: Referring Kaide Gage: Treating Nazier Neyhart/Extender: Dierdre Harness in Treatment: 2 Multidisciplinary Care Plan reviewed with physician Active Inactive Abuse / Safety / Falls / Self Care Management Nursing Diagnoses: Potential for falls Potential for injury related to falls Goals: Patient will not experience any injury related to falls Date Initiated: 03/26/2021 Target Resolution Date: 04/24/2021 Goal Status: Active Patient/caregiver will verbalize/demonstrate measures taken to prevent injury and/or falls Date Initiated: 03/26/2021 Target Resolution Date: 04/24/2021 Goal Status: Active Interventions: Assess Activities of Daily Living upon admission and as needed Assess fall risk on admission and as needed Assess: immobility, friction, shearing, incontinence upon admission and as needed Assess impairment of mobility on admission and as needed per policy Assess personal safety and home safety (as indicated) on admission and as needed Assess self care needs on admission and as needed Provide education on fall prevention Provide education on personal and home safety Notes: Venous Leg Ulcer Nursing Diagnoses: Knowledge deficit related to disease process and management Goals: Patient will maintain optimal edema control Date  Initiated: 03/26/2021 Target Resolution Date: 04/24/2021 Goal Status: Active Interventions: Assess peripheral edema status every visit. Compression as ordered Provide education on venous insufficiency Notes: Wound/Skin Impairment Nursing Diagnoses: Impaired tissue integrity Knowledge deficit related to ulceration/compromised skin integrity Goals: Patient/caregiver will verbalize understanding of skin care regimen Date Initiated: 03/26/2021 Target Resolution Date: 04/24/2021 Goal Status: Active Ulcer/skin breakdown will have a volume reduction of 30% by week 4 Date Initiated: 03/26/2021 Target Resolution Date: 04/24/2021 Goal Status: Active Interventions: Assess patient/caregiver ability to obtain necessary supplies Assess patient/caregiver ability to perform ulcer/skin care regimen upon admission and as needed Assess ulceration(s) every visit Provide education on ulcer and skin care Notes: Electronic Signature(s) Signed: 04/09/2021 5:00:57 PM By: Lorrin Jackson Entered By: Lorrin Jackson on 04/09/2021 09:01:34 -------------------------------------------------------------------------------- Pain Assessment Details Patient Name: Date of Service: [name] 04/09/2021 9:00 [place] Record Number: [id number] Patient Account Number: [id number] Date of Birth/Sex: Treating RN: 1940-04-02 (81 y.o. [name] Primary Care Liley Rake: Allyn Kenner Other Clinician: Referring Ernestine Langworthy: Treating Nakota Ackert/Extender: Dierdre Harness in Treatment: 2 Active Problems Location of Pain Severity and Description of Pain Patient Has Paino No Site Locations Pain Management and Medication Current Pain Management: Electronic Signature(s) Signed: 04/09/2021  5:00:57 PM By: Fara Chute By: Lorrin Jackson on 04/09/2021 09:07:51 -------------------------------------------------------------------------------- Patient/Caregiver Education Details Patient  Name: Date of Service: [place] TLINGTO N, Takayla S. 1/26/2023andnbsp9:00 [place] Record Number: [id number] Patient Account Number: [id number] Date of Birth/Gender: Treating RN: 15-Dec-1940 (81 y.o. [name] Primary Care Physician: Allyn Kenner Other Clinician: Referring Physician: Treating Physician/Extender: Dierdre Harness in Treatment: 2 Education Assessment Education Provided To: Patient Education Topics Provided Venous: Methods: Explain/Verbal, Printed Responses: State content correctly Wound/Skin Impairment: Methods: Explain/Verbal, Printed Responses: State content correctly Electronic Signature(s) Signed: 04/09/2021 5:00:57 PM By: Lorrin Jackson Entered By: Lorrin Jackson on 04/09/2021 09:01:54 -------------------------------------------------------------------------------- Wound Assessment Details Patient Name: Date of Service: [name] 04/09/2021 9:00 A M Medical Record Number: [id number] Patient Account Number: [id number] Date of Birth/Sex: Treating RN: February 08, 1941 (81 y.o. [name] Primary Care Nancie Bocanegra: Allyn Kenner Other Clinician: Referring Breeonna Mone: Treating Natiya Seelinger/Extender: Dierdre Harness in Treatment: 2 Wound Status Wound Number: 1 Primary Venous Leg Ulcer Etiology: Wound Location: Right, Lateral Lower Leg Wound Open Wounding Event: Trauma Status: Date Acquired: 02/14/2021 Comorbid Anemia, Chronic Obstructive Pulmonary Disease (COPD), Weeks Of Treatment: 2 History: Hypertension, Osteoarthritis, Confinement Anxiety Clustered Wound: No Photos Wound Measurements Length: (cm) 0.9 Width: (cm) 0.6 Depth: (cm) 0.1 Area: (cm) 0.424 Volume: (cm) 0.042 % Reduction in Area: 77.5% % Reduction in Volume: 88.9% Epithelialization: Medium (34-66%) Tunneling: No Undermining: No Wound Description Classification: Full Thickness Without Exposed Support Structures Wound Margin: Distinct, outline  attached Exudate Amount: Medium Exudate Type: Serosanguineous Exudate Color: red, brown Foul Odor After Cleansing: No Slough/Fibrino Yes Wound Bed Granulation Amount: Large (67-100%) Exposed Structure Granulation Quality: Pink Fascia Exposed: No Necrotic Amount: Small (1-33%) Fat Layer (Subcutaneous Tissue) Exposed: Yes Necrotic Quality: Adherent Slough Tendon Exposed: No Muscle Exposed: No Joint Exposed: No Bone Exposed: No Treatment Notes Wound #1 (Lower Leg) Wound Laterality: Right, Lateral Cleanser Soap and Water Discharge Instruction: May shower and wash wound with dial antibacterial soap and water prior to dressing change. Wound Cleanser Discharge Instruction: Cleanse the wound with wound cleanser prior to applying a clean dressing using gauze sponges, not tissue or cotton balls. Peri-Wound Care Triamcinolone 15 (g) Discharge Instruction: Use triamcinolone 15 (g) as directed Sween Lotion (Moisturizing lotion) Discharge Instruction: Apply moisturizing lotion as directed Topical Gentamicin Discharge Instruction: As directed by physician Primary Dressing Hydrofera Blue Ready Foam, 2.5 x2.5 in Discharge Instruction: Apply to wound bed as instructed Secondary Dressing Woven Gauze Sponge, Non-Sterile 4x4 in Discharge Instruction: Apply over primary dressing as directed. Secured With Compression Wrap Kerlix Roll 4.5x3.1 (in/yd) Discharge Instruction: Apply Kerlix and Coban compression as directed. Coban Self-Adherent Wrap 4x5 (in/yd) Discharge Instruction: Apply over Kerlix as directed. Compression Stockings Add-Ons Electronic Signature(s) Signed: 04/09/2021 5:00:57 PM By: Lorrin Jackson Signed: 04/09/2021 6:13:38 PM By: Levan Hurst RN, BSN Entered By: Levan Hurst on 04/09/2021 09:15:56 -------------------------------------------------------------------------------- [place] Details Patient Name: Date of Service: [name] 04/09/2021 9:00 A  M Medical Record Number: [id number] Patient Account Number: [id number] Date of Birth/Sex: Treating RN: [birth year] (81 y.o. [name] Primary Care Joette Schmoker: Allyn Kenner Other Clinician: Referring Nayla Dias: Treating Izael Bessinger/Extender: Dierdre Harness in Treatment: 2 Vital Signs Time Taken: 09:05 Temperature (F): 97.5 Height (in): 60 Pulse (bpm): 90 Weight (lbs): 180 Respiratory Rate (breaths/min): 20 Body Mass Index (BMI): 35.2 Blood Pressure (mmHg): 150/89 Reference Range: 80 - 120 mg / dl Electronic Signature(s) Signed: 04/09/2021 5:00:57 PM By: Lorrin Jackson Entered By: Onnie Boer  Jodi on 04/09/2021 09:07:23

## 2021-04-09 NOTE — Progress Notes (Signed)
Glenda Terry ([id number]) Visit Report for 04/09/2021 Chief Complaint Document Details Patient Name: Date of Service: Glenda Terry, Glenda Terry 04/09/2021 9:00 A M Medical Record Number: [id number] Patient Account Number: [id number] Date of Birth/Sex: Treating RN: March 11, 1941 (81 y.o. Glenda Terry Primary Care Provider: Allyn Kenner Other Clinician: Referring Provider: Treating Provider/Extender: Dierdre Harness in Treatment: 2 Information Obtained from: Patient Chief Complaint 03/26/2021; Right lower extremity wound Following trauma Electronic Signature(s) Signed: 04/09/2021 9:33:15 AM By: Kalman Shan DO Entered By: Kalman Shan on 04/09/2021 09:29:18 -------------------------------------------------------------------------------- Debridement Details Patient Name: Date of Service: Glenda Terry. 04/09/2021 9:00 A M Medical Record Number: [id number] Patient Account Number: [id number] Date of Birth/Sex: Treating RN: 1940/09/10 (81 y.o. Glenda Terry Primary Care Provider: Allyn Kenner Other Clinician: Referring Provider: Treating Provider/Extender: Dierdre Harness in Treatment: 2 Debridement Performed for Assessment: Wound #1 Right,Lateral Lower Leg Performed By: Physician Kalman Shan, DO Debridement Type: Debridement Severity of Tissue Pre Debridement: Fat layer exposed Level of Consciousness (Pre-procedure): Awake and Alert Pre-procedure Verification/Time Out Yes - 09:19 Taken: Start Time: 09:20 Pain Control: Other : Benzocaine T Area Debrided (L x W): otal 0.9 (cm) x 0.6 (cm) = 0.54 (cm) Tissue and other material debrided: Non-Viable, Slough, Subcutaneous, Slough Level: Skin/Subcutaneous Tissue Debridement Description: Excisional Instrument: Curette Bleeding: Minimum Hemostasis Achieved: Pressure End Time: 09:25 Response to Treatment: Procedure was tolerated well Level of Consciousness (Post- Awake and  Alert procedure): Post Debridement Measurements of Total Wound Length: (cm) 0.9 Width: (cm) 0.6 Depth: (cm) 0.1 Volume: (cm) 0.042 Character of Wound/Ulcer Post Debridement: Stable Severity of Tissue Post Debridement: Fat layer exposed Post Procedure Diagnosis Same as Pre-procedure Electronic Signature(s) Signed: 04/09/2021 9:33:15 AM By: Kalman Shan DO Signed: 04/09/2021 5:00:57 PM By: Lorrin Jackson Entered By: Lorrin Jackson on 04/09/2021 09:25:18 -------------------------------------------------------------------------------- HPI Details Patient Name: Date of Service: Glenda Terry. 04/09/2021 9:00 A M Medical Record Number: [id number] Patient Account Number: [id number] Date of Birth/Sex: Treating RN: Aug 17, 1940 (81 y.o. 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 She states that she hit her leg against a table And developed a wound. She has been following 2 weeks with a wound care center in [place] for this issue. For the past 2 weeks she has had her legs wrapped with 3 layer compression and interchanging between Medihoney and silver alginate. She reports minimal improvement in wound healing. She currently denies signs of infection. 1/19; patient presents for follow-up. She is tolerated the compression wrap well. She has no issues or complaints today. She denies signs of infection. 1/26; patient presents for follow-up.  She has tolerated the compression wrap well. She is pleased with her wound healing. She denies signs of infection. Electronic Signature(s) Signed: 04/09/2021 9:33:15 AM By: Kalman Shan DO Entered By: Kalman Shan on 04/09/2021 09:29:47 -------------------------------------------------------------------------------- Physical Exam Details Patient Name: Date of Service: Glenda Terry. 04/09/2021 9:00 A M Medical Record Number: [id number] Patient Account Number: [id number] Date of Birth/Sex: Treating RN: 09/16/1940 (81 y.o. Glenda Terry Primary Care  Provider: Allyn Kenner Other Clinician: Referring Provider: Treating Provider/Extender: Dierdre Harness in Treatment: 2 Constitutional respirations regular, non-labored and within target range for patient.. Cardiovascular 2+ dorsalis pedis/posterior tibialis pulses. Psychiatric pleasant and cooperative. Notes Right lower extremity: T the lateral proximal aspect of the leg there is an open wound with granulation tissue and nonviable tissue present. No surrounding o signs of infection. Nonpitting edema to the knee. Appears well-healing. Electronic Signature(s) Signed: 04/09/2021 9:33:15 AM By: Kalman Shan DO Entered By: Kalman Shan on 04/09/2021 09:30:30 -------------------------------------------------------------------------------- Physician Orders Details Patient Name: Date of Service: Glenda Terry. 04/09/2021 9:00 A M Medical Record Number: [id number] Patient Account Number: [id number] Date of Birth/Sex: Treating RN: [birth year] (81 y.o. Glenda Terry Primary Care Provider: Allyn Kenner Other Clinician: Referring Provider: Treating Provider/Extender: Dierdre Harness in Treatment: 2 Verbal / Phone Orders: No Diagnosis Coding ICD-10 Coding Code Description [phone] Non-pressure chronic ulcer of other part of right lower leg with fat layer exposed I87.2 Venous  insufficiency (chronic) (peripheral) T79.8XXA Other early complications of trauma, initial encounter I10 Essential (primary) hypertension J44.9 Chronic obstructive pulmonary disease, unspecified M06.9 Rheumatoid arthritis, unspecified Follow-up Appointments ppointment in 1 week. - Dr. Heber [place] Return A Bathing/ Shower/ Hygiene May shower with protection but do not get wound dressing(s) wet. - Ok to use Market researcher, can purchase at [facility], [facility], or [employer] Edema Control - Lymphedema / SCD / Other Elevate legs to the level of the heart or above for 30 minutes daily and/or when sitting, a frequency of: - throughout the day Avoid standing for long periods of time. Exercise regularly Wound Treatment Wound #1 - Lower Leg Wound Laterality: Right, Lateral Cleanser: Soap and Water 1 x Per Week/30 Days Discharge Instructions: May shower and wash wound with dial antibacterial soap and water prior to dressing change. Cleanser: Wound Cleanser 1 x Per Week/30 Days Discharge Instructions: Cleanse the wound with wound cleanser prior to applying a clean dressing using gauze sponges, not tissue or cotton balls. Peri-Wound Care: Triamcinolone 15 (g) 1 x Per Week/30 Days Discharge Instructions: Use triamcinolone 15 (g) as directed Peri-Wound Care: Sween Lotion (Moisturizing lotion) 1 x Per Week/30 Days Discharge Instructions: Apply moisturizing lotion as directed Topical: Gentamicin 1 x Per Week/30 Days Discharge Instructions: As directed by physician Prim Dressing: Hydrofera Blue Ready Foam, 2.5 x2.5 in 1 x Per Week/30 Days ary Discharge Instructions: Apply to wound bed as instructed Secondary Dressing: Woven Gauze Sponge, Non-Sterile 4x4 in 1 x Per Week/30 Days Discharge Instructions: Apply over primary dressing as directed. Compression Wrap: Kerlix Roll 4.5x3.1 (in/yd) 1 x Per Week/30 Days Discharge Instructions: Apply Kerlix and Coban compression as directed. Compression Wrap: Coban Self-Adherent  Wrap 4x5 (in/yd) 1 x Per Week/30 Days Discharge Instructions: Apply over Kerlix as directed. Electronic Signature(s) Signed: 04/09/2021 9:33:15 AM By: Kalman Shan DO Entered By: Kalman Shan on 04/09/2021 09:30:49 -------------------------------------------------------------------------------- Problem List Details Patient Name: Date of Service: Glenda Terry. 04/09/2021 9:00 A M Medical Record Number: [id number] Patient Account Number: [id number] Date of Birth/Sex: Treating RN: 1940-09-29 (81 y.o. Glenda Terry Primary Care Provider: Allyn Kenner Other Clinician: Referring Provider: Treating Provider/Extender: Dierdre Harness in Treatment: 2 Active Problems ICD-10 Encounter Code Description Active Date MDM Diagnosis [phone] Non-pressure chronic ulcer of other part of right lower leg with fat layer 03/26/2021 No Yes exposed I87.2 Venous insufficiency (chronic) (peripheral) 03/26/2021 No Yes T79.8XXA Other early complications of trauma, initial encounter 03/26/2021 No Yes  I10 Essential (primary) hypertension 03/26/2021 No Yes J44.9 Chronic obstructive pulmonary disease, unspecified 03/26/2021 No Yes M06.9 Rheumatoid arthritis, unspecified 03/26/2021 No Yes Inactive Problems Resolved Problems Electronic Signature(s) Signed: 04/09/2021 9:33:15 AM By: Kalman Shan DO Entered By: Kalman Shan on 04/09/2021 09:28:45 -------------------------------------------------------------------------------- Progress Note Details Patient Name: Date of Service: Glenda Terry. 04/09/2021 9:00 A M Medical Record Number: [id number] Patient Account Number: [id number] Date of Birth/Sex: Treating RN: 1940/05/25 (81 y.o. 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 She states that she hit her leg against a table And developed a wound. She has been following 2 weeks with a wound care center in [place] for this issue. For the past 2 weeks she has had her legs wrapped with 3 layer compression and interchanging between Medihoney and silver alginate. She reports minimal improvement in wound healing. She currently denies signs of infection. 1/19; patient presents for follow-up. She is tolerated the compression wrap well. She has no issues or complaints today. She denies signs of infection. 1/26; patient presents for follow-up. She has tolerated the compression wrap well. She is pleased with her wound healing. She denies signs of infection. Patient History Information obtained from Patient. Family History Unknown History. Social History Former smoker - quit 30 years ago, Alcohol Use - Never, Drug Use - No History, Caffeine Use - Rarely. Medical History Hematologic/Lymphatic Patient has history of Anemia Respiratory Patient has history of Chronic Obstructive Pulmonary Disease (COPD) Cardiovascular Patient has history of Hypertension Musculoskeletal Patient has history of Osteoarthritis Psychiatric Patient has history of Confinement Anxiety Medical A Surgical History Notes nd Gastrointestinal GERD Objective Constitutional respirations regular, non-labored and within target range for patient.. Vitals Time Taken: 9:05 AM, Height: 60 in, Weight: 180 lbs, BMI: 35.2,  Temperature: 97.5 F, Pulse: 90 bpm, Respiratory Rate: 20 breaths/min, Blood Pressure: 150/89 mmHg. Cardiovascular 2+ dorsalis pedis/posterior tibialis pulses. Psychiatric pleasant and cooperative. General Notes: Right lower extremity: T the lateral proximal aspect of the leg there is an open wound with granulation tissue and nonviable tissue present. No o surrounding signs of infection. Nonpitting edema to the knee. Appears well-healing. Integumentary (Hair, Skin) Wound #1 status is Open. Original cause of wound was Trauma. The date acquired was: 02/14/2021. The wound has been in treatment 2 weeks. The wound is located on the Right,Lateral Lower Leg. The wound measures 0.9cm length x 0.6cm width x 0.1cm depth; 0.424cm^2 area and 0.042cm^3 volume. There is Fat Layer (Subcutaneous Tissue) exposed. There is no tunneling or undermining noted. There is a medium amount of serosanguineous drainage noted. The wound margin is distinct with the outline attached to the wound base. There is large (67-100%) pink granulation within the wound bed. There is a small (1-33%) amount of necrotic tissue within the wound bed including Adherent Slough. Assessment Active Problems ICD-10 Non-pressure chronic ulcer of other part of right lower leg with fat layer exposed Venous insufficiency (chronic) (peripheral) Other early complications of trauma, initial encounter Essential (primary) hypertension Chronic obstructive pulmonary  disease, unspecified Rheumatoid arthritis, unspecified Patient's wound has shown improvement in size in appearance since last clinic visit. I debrided nonviable tissue. No surrounding signs of infection. I recommended continuing with current treatment of Hydrofera Blue and gentamicin under compression therapy. Follow-up in 1 week. Procedures Wound #1 Pre-procedure diagnosis of Wound #1 is a Venous Leg Ulcer located on the Right,Lateral Lower Leg .Severity of Tissue Pre Debridement is: Fat  layer exposed. There was a Excisional Skin/Subcutaneous Tissue Debridement with a total area of 0.54 sq cm performed by Kalman Shan, DO. With the following instrument(s): Curette to remove Non-Viable tissue/material. Material removed includes Subcutaneous Tissue and Slough and after achieving pain control using Other (Benzocaine). No specimens were taken. A time out was conducted at 09:19, prior to the start of the procedure. A Minimum amount of bleeding was controlled with Pressure. The procedure was tolerated well. Post Debridement Measurements: 0.9cm length x 0.6cm width x 0.1cm depth; 0.042cm^3 volume. Character of Wound/Ulcer Post Debridement is stable. Severity of Tissue Post Debridement is: Fat layer exposed. Post procedure Diagnosis Wound #1: Same as Pre-Procedure Plan Follow-up Appointments: Return Appointment in 1 week. - Dr. Heber [place] Bathing/ Shower/ Hygiene: May shower with protection but do not get wound dressing(s) wet. - Ok to use Market researcher, can purchase at [facility], [facility], or [employer] Edema Control - Lymphedema / SCD / Other: Elevate legs to the level of the heart or above for 30 minutes daily and/or when sitting, a frequency of: - throughout the day Avoid standing for long periods of time. Exercise regularly WOUND #1: - Lower Leg Wound Laterality: Right, Lateral Cleanser: Soap and Water 1 x Per Week/30 Days Discharge Instructions: May shower and wash wound with dial antibacterial soap and water prior to dressing change. Cleanser: Wound Cleanser 1 x Per Week/30 Days Discharge Instructions: Cleanse the wound with wound cleanser prior to applying a clean dressing using gauze sponges, not tissue or cotton balls. Peri-Wound Care: Triamcinolone 15 (g) 1 x Per Week/30 Days Discharge Instructions: Use triamcinolone 15 (g) as directed Peri-Wound Care: Sween Lotion (Moisturizing lotion) 1 x Per Week/30 Days Discharge Instructions: Apply moisturizing lotion as directed Topical:  Gentamicin 1 x Per Week/30 Days Discharge Instructions: As directed by physician Prim Dressing: Hydrofera Blue Ready Foam, 2.5 x2.5 in 1 x Per Week/30 Days ary Discharge Instructions: Apply to wound bed as instructed Secondary Dressing: Woven Gauze Sponge, Non-Sterile 4x4 in 1 x Per Week/30 Days Discharge Instructions: Apply over primary dressing as directed. Com pression Wrap: Kerlix Roll 4.5x3.1 (in/yd) 1 x Per Week/30 Days Discharge Instructions: Apply Kerlix and Coban compression as directed. Com pression Wrap: Coban Self-Adherent Wrap 4x5 (in/yd) 1 x Per Week/30 Days Discharge Instructions: Apply over Kerlix as directed. 1. In office sharp debridement 2. Hydrofera Blue and gentamicin under compression therapy 3. Follow-up in 1 week Electronic Signature(s) Signed: 04/09/2021 9:33:15 AM By: Kalman Shan DO Entered By: Kalman Shan on 04/09/2021 09:31:37 -------------------------------------------------------------------------------- HxROS Details Patient Name: Date of Service: Glenda Terry. 04/09/2021 9:00 A M Medical Record Number: [id number] Patient Account Number: [id number] Date of Birth/Sex: Treating RN: 1941-02-16 (81 y.o. Glenda Terry Primary Care Provider: Allyn Kenner Other Clinician: Referring Provider: Treating Provider/Extender: Dierdre Harness in Treatment: 2 Information Obtained From Patient Hematologic/Lymphatic Medical History: Positive for: Anemia Respiratory Medical History: Positive for: Chronic Obstructive Pulmonary Disease (COPD) Cardiovascular Medical History: Positive for: Hypertension Gastrointestinal Medical History: Past Medical History Notes: GERD Musculoskeletal Medical History: Positive for: Osteoarthritis Psychiatric Medical History: Positive  for: Confinement Anxiety Immunizations Pneumococcal Vaccine: Received Pneumococcal Vaccination: Yes Received Pneumococcal Vaccination On or After 60th  Birthday: Yes Implantable Devices None Family and Social History Unknown History: Yes; Former smoker - quit 30 years ago; Alcohol Use: Never; Drug Use: No History; Caffeine Use: Rarely; Financial Concerns: No; Food, Clothing or Shelter Needs: No; Support System Lacking: No; Transportation Concerns: No Electronic Signature(s) Signed: 04/09/2021 9:33:15 AM By: Kalman Shan DO Signed: 04/09/2021 6:13:38 PM By: Levan Hurst RN, BSN Entered By: Kalman Shan on 04/09/2021 09:29:52 -------------------------------------------------------------------------------- [place] Details Patient Name: Date of Service: Glenda Terry 04/09/2021 Medical Record Number: [id number] Patient Account Number: [id number] Date of Birth/Sex: Treating RN: 20-Jun-1940 (81 y.o. Glenda Terry Primary Care Provider: Allyn Kenner Other Clinician: Referring Provider: Treating Provider/Extender: Dierdre Harness in Treatment: 2 Diagnosis Coding ICD-10 Codes Code Description [phone] Non-pressure chronic ulcer of other part of right lower leg with fat layer exposed I87.2 Venous insufficiency (chronic) (peripheral) T79.8XXA Other early complications of trauma, initial encounter I10 Essential (primary) hypertension J44.9 Chronic obstructive pulmonary disease, unspecified M06.9 Rheumatoid arthritis, unspecified Facility Procedures CPT4 Code: [id number] Description: 80165 - DEB SUBQ TISSUE 20 SQ CM/< ICD-10 Diagnosis Description L97.812 Non-pressure chronic ulcer of other part of right lower leg with fat layer exp Modifier: osed Quantity: 1 Physician Procedures : CPT4 Code Description Modifier [id number] - WC PHYS SUBQ TISS 20 SQ CM ICD-10 Diagnosis Description L97.812 Non-pressure chronic ulcer of other part of right lower leg with fat layer exposed Quantity: 1 Electronic Signature(s) Signed: 04/09/2021 9:33:15 AM By: Kalman Shan DO Entered By: Kalman Shan on 04/09/2021  09:32:58

## 2021-04-16 ENCOUNTER — Encounter (HOSPITAL_BASED_OUTPATIENT_CLINIC_OR_DEPARTMENT_OTHER): Payer: PPO | Attending: Internal Medicine | Admitting: Internal Medicine

## 2021-04-16 ENCOUNTER — Other Ambulatory Visit: Payer: Self-pay

## 2021-04-16 DIAGNOSIS — M19011 Primary osteoarthritis, right shoulder: Secondary | ICD-10-CM | POA: Diagnosis not present

## 2021-04-16 DIAGNOSIS — W2203XA Walked into furniture, initial encounter: Secondary | ICD-10-CM | POA: Diagnosis not present

## 2021-04-16 DIAGNOSIS — I890 Lymphedema, not elsewhere classified: Secondary | ICD-10-CM | POA: Diagnosis not present

## 2021-04-16 DIAGNOSIS — I10 Essential (primary) hypertension: Secondary | ICD-10-CM | POA: Diagnosis not present

## 2021-04-16 DIAGNOSIS — M069 Rheumatoid arthritis, unspecified: Secondary | ICD-10-CM | POA: Diagnosis not present

## 2021-04-16 DIAGNOSIS — T798XXA Other early complications of trauma, initial encounter: Secondary | ICD-10-CM | POA: Insufficient documentation

## 2021-04-16 DIAGNOSIS — L97812 Non-pressure chronic ulcer of other part of right lower leg with fat layer exposed: Secondary | ICD-10-CM | POA: Diagnosis not present

## 2021-04-16 DIAGNOSIS — M170 Bilateral primary osteoarthritis of knee: Secondary | ICD-10-CM | POA: Diagnosis not present

## 2021-04-16 DIAGNOSIS — J449 Chronic obstructive pulmonary disease, unspecified: Secondary | ICD-10-CM | POA: Diagnosis not present

## 2021-04-16 DIAGNOSIS — M19012 Primary osteoarthritis, left shoulder: Secondary | ICD-10-CM | POA: Diagnosis not present

## 2021-04-16 DIAGNOSIS — Z87891 Personal history of nicotine dependence: Secondary | ICD-10-CM | POA: Diagnosis not present

## 2021-04-16 DIAGNOSIS — I872 Venous insufficiency (chronic) (peripheral): Secondary | ICD-10-CM | POA: Insufficient documentation

## 2021-04-16 NOTE — Progress Notes (Signed)
Cardiology Office Note:    Date:  04/17/2021   ID:  Glenda Terry, DOB [birth year], MRN [id number]  PCP:  Celene Squibb, MD   [facility] Cardiologist:  Elouise Munroe, MD     Referring MD: Celene Squibb, MD   Follow-up for hypertension and lower extremity swelling.  History of Present Illness:    Glenda Terry is a 81 y.o. female with a hx of hypertension, CKD stage III, anxiety, COPD, Cushing syndrome, GERD, pulmonary eosinophilia, and HFpEF.  She is previously seen and evaluated with cardiac monitor for sinus tachycardia in 2014.  Her echocardiogram in 2017 was normal.  She was seen in 2018 and had no significant pathology.  She was seen in follow-up by Dr. Margaretann Loveless on 06/17/2020.  During that time she denied chest pain, chest pressure, PND, and orthopnea.  She denied fever, cough, chills, nausea, vomiting, diet Rea, dizziness, and lightheadedness.  She had been seen by nephrology and her olmesartan was held due to AKI.  She struggled with abdominal pain which she reported was from a chicken bone that become stuck in her stomach.  This caused her blood pressure to be elevated and on exam her blood pressure was elevated.  She was encouraged to take her diuretic medication daily due to her diastolic heart failure.  She was started on amlodipine.  Lower extremity compression stockings were recommended for her lower extremity swelling.  Recently admitted to the hospital on 01/06/2021 and discharged on 01/09/2021 with asthma exacerbation.  She responded well to IV steroids, nebulizer treatments, and antitussive drugs.  Her COVID-19 screening was negative.  Her chest x-ray showed bronchitis and she was also given azithromycin.  She presented to the clinic 01/26/2021 for follow-up evaluation stated she felt well.  She had resumed walking about 45 minutes/day.  She enjoyed using her stand-up walker.  She followed a strict low-sodium diet.  She also presented  wearing lower  extremity support stockings.  She had been monitoring her blood pressure and weight.  They were both  stable.  We reviewed diastolic heart failure and signs and symptoms.  We reviewed her recent hospital admission for asthma exacerbation.  I  ordered a BMP  continued her current diet, continue her current exercise regimen, and planned follow-up for 4 to 6 months.  We discussed the importance of contacting the office with a weight increase of 3 pounds overnight or 5 pounds in 1 week.  I  also asked her to continue to monitor her blood pressure with regards to her diastolic CHF.  Her and her daughter expressed understanding.  She was seen in follow-up by Dr. Percival Spanish on 03/27/2021.  She reported that in December she had injured her leg.  She was being seen by the wound clinic.  Since her injury she was noted to have increased swelling.  It was noted to be more on the right than on the left.  However during evaluation she was noted to have bilateral lower extremity swelling.  Her weights have been stable.  She denied increase of her sodium content and fluid.  Her ambulation was decreased.  She denied new symptoms of shortness of breath PND and orthopnea.  She denied new chest pain discomfort.  She denied palpitations fevers and chills.  Her blood pressure was noted to be mildly elevated at 146/82.  She was concerned that her amlodipine may be contributing to her lower extremity swelling.  It was reduced to 5 mg daily.  She was instructed to maintain a blood pressure log.  It was felt that she may need a beta-blocker or hydralazine for further blood pressure management.  She presents to the clinic today for follow-up evaluation states she notices right foot swelling.  She continues to see wound care for her right anterior lower leg wound.  Her and her daughter report it is healing well.  She will have 1 more visit to wound care next week.  We reviewed her blood pressure and weight logs.  She has been doubling her  torsemide on days when she notices weight increase.  She is also been affected by her asthma and allergies.  We reviewed the importance of taking her Flonase regularly.  I will increase her torsemide to 40 mg Monday Wednesday Friday and on the days when she takes extra torsemide we will double her potassium.  I reviewed the importance of fluid restriction and daily weights.  I will order a BMP in 1 week, start carvedilol 3.125 mg twice daily, and keep follow-up as scheduled.  Today she denies chest pain, increased shortness of breath, increased lower extremity edema, fatigue, palpitations, melena, hematuria, hemoptysis, diaphoresis, weakness, presyncope, syncope, orthopnea, and PND.  Past Medical History:  Diagnosis Date   Anxiety    Asthma    Back pain, chronic    CKD (chronic kidney disease) stage 3, GFR 30-59 ml/min (HCC)    Compression fx, thoracic spine (HCC)    T - 11   COPD (chronic obstructive pulmonary disease) ([place]) 12/16/2015   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HCC)    Essential hypertension    GERD (gastroesophageal reflux disease)    HOH (hard of hearing)    Inflammatory arthritis    Iron deficiency anemia 01/2012   Pulmonary eosinophilia (HCC)    Spinal stenosis    Tubular adenoma 11/2012    Past Surgical History:  Procedure Laterality Date   ABDOMINAL HYSTERECTOMY     BACK SURGERY     CARPAL TUNNEL RELEASE Right 06/2012   CATARACT EXTRACTION W/PHACO Left 11/19/2014   Procedure: CATARACT EXTRACTION PHACO AND INTRAOCULAR LENS PLACEMENT ([place]);  Surgeon: Williams Che, MD;  Location: [facility];  Service: Ophthalmology;  Laterality: Left;  CDE: 4.77   CATARACT EXTRACTION W/PHACO Right 02/03/2015   Procedure: CATARACT EXTRACTION PHACO AND INTRAOCULAR LENS PLACEMENT ([facility]);  Surgeon: Williams Che, MD;  Location: [facility];  Service: Ophthalmology;  Laterality: Right;  CDE: 4.54   COLONOSCOPY  06/19/2008   RMR: tortuous and elongated colon with scattered left-sided diverticula/colonic mucosa  appeared entirely normal. Prior colonic ulcers had resolved.   COLONOSCOPY  12/2007   Dr. Gala Romney: Scattered diffuse sigmoid diverticula, 2 areas of ulceration at the hepatic flexure. Biopsies unremarkable.   COLONOSCOPY N/A 11/20/2012   next TCS 11/2017   COLONOSCOPY WITH PROPOFOL N/A 12/22/2017   Procedure: COLONOSCOPY WITH PROPOFOL;  Surgeon: Daneil Dolin, MD; diverticula in the sigmoid and descending colon and 2 tubular adenomas.  No recommendations to repeat.     DECOMPRESSIVE LUMBAR LAMINECTOMY LEVEL 2  04/20/2012   Procedure: DECOMPRESSIVE LUMBAR LAMINECTOMY LEVEL 2;  Surgeon: Tobi Bastos, MD;  Location: [facility];  Service: Orthopedics;  Laterality: Right;  Decompressive Lumbar Laminectomy of the L4 - L5 and L5 - S1 Complete/Laminectomy L5 on the Right (X-Ray)   ESOPHAGOGASTRODUODENOSCOPY  12/2007   Dr. 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 Small hiatal hernia. Slightly pale duodenal mucosa (biopsy unremarkable)   ESOPHAGOGASTRODUODENOSCOPY (EGD) WITH PROPOFOL N/A  01/20/2017   Dr. Gala Romney: Medium sized hiatal hernia empiric esophageal dilation for history of dysphagia   ESOPHAGOGASTRODUODENOSCOPY (EGD) WITH PROPOFOL N/A 04/08/2020   Surgeon: Eloise Harman, DO;  medium size hiatal hernia 3 to 4 cm chicken bone lodged in the gastric antrum just proximal to the pylorus removed with a snare with mild bleeding at the site of lodged bone.  Normal examined duodenum.   EYE SURGERY     BIL CATARACTS   FOREIGN BODY REMOVAL N/A 04/08/2020   Procedure: FOREIGN BODY REMOVAL;  Surgeon: Eloise Harman, DO;  [facility];  Service: Endoscopy;  Laterality: N/A;   INCISIONAL HERNIA REPAIR N/A 11/12/2020   Procedure: HERNIA REPAIR INCISIONAL;  Surgeon: Aviva Signs, MD;  Location: [facility];  Service: General;  Laterality: N/A;   IR KYPHO THORACIC WITH BONE BIOPSY  02/08/2017   IR RADIOLOGIST EVAL & MGMT  02/02/2017   JOINT REPLACEMENT      KNEE ARTHROSCOPY Right    MALONEY DILATION N/A 01/20/2017   Procedure: MALONEY DILATION;  Surgeon: Daneil Dolin, MD;  [facility]: [facility];  Service: Endoscopy;  Laterality: N/A;   POLYPECTOMY  12/22/2017   Procedure: POLYPECTOMY;  Surgeon: Daneil Dolin, MD;  [facility];  Service: Endoscopy;;   REVERSE SHOULDER ARTHROPLASTY Right 05/24/2014   Procedure: RIGHT SHOULDER REVERSE ARTHROPLASTY;  Surgeon: Netta Cedars, MD;  Location: [facility];  Service: Orthopedics;  Laterality: Right;   REVERSE SHOULDER ARTHROPLASTY Left 06/10/2017   Procedure: LEFT REVERSE SHOULDER ARTHROPLASTY;  Surgeon: Netta Cedars, MD;  Location: [facility];  Service: Orthopedics;  Laterality: Left;   TOTAL KNEE ARTHROPLASTY  01/24/2012   Procedure: TOTAL KNEE ARTHROPLASTY;  Surgeon: Gearlean Alf, MD;  Location: [facility];  Service: Orthopedics;  Laterality: Right;   TOTAL KNEE ARTHROPLASTY Left 10/15/2019   Procedure: TOTAL KNEE ARTHROPLASTY;  Surgeon: Gaynelle Arabian, MD;  Location: [facility];  Service: Orthopedics;  Laterality: Left;  41min   TUBAL LIGATION      Current Medications: Current Meds  Medication Sig   Acetaminophen (APAP 500 PO) Take 2 tablets by mouth 2 (two) times daily as needed.   albuterol (PROAIR HFA) 108 (90 Base) MCG/ACT inhaler Inhale 2 puffs into the lungs every 6 (six) hours as needed for wheezing or shortness of breath (For COPD).    ALPRAZolam (XANAX) 0.5 MG tablet Take 0.5 tablets (0.25 mg total) by mouth 2 (two) times daily as needed for anxiety.   amLODipine (NORVASC) 5 MG tablet Take 1 tablet (5 mg total) by mouth daily.   Benralizumab 30 MG/ML SOSY Inject 30 mg into the skin. Once A Day Every 56 Days   calcium carbonate (OS-CAL) 600 MG TABS tablet Take 600 mg by mouth 2 (two) times daily with a meal.   Cholecalciferol (VITAMIN D3) 50 MCG (2000 UT) CAPS Take 1 capsule by mouth daily.   denosumab (PROLIA) 60 MG/ML SOSY injection Inject 60 mg into the skin every 6 (six) months.    diphenhydrAMINE (BENADRYL) 25 mg capsule Take 25 mg by mouth at bedtime.   DULERA 200-5 MCG/ACT AERO Inhale 2 puffs into the lungs 2 (two) times daily.   esomeprazole (NEXIUM) 40 MG capsule Take 1 capsule (40 mg total) by mouth 2 (two) times daily.   ferrous sulfate 325 (65 FE) MG tablet Take 1 tablet (325 mg total) by mouth daily with breakfast.   fexofenadine (ALLEGRA) 180 MG tablet Take 180 mg by mouth daily.   fluticasone (FLONASE)  50 MCG/ACT nasal spray Place 1 spray into both nostrils daily as needed for allergies or rhinitis.    ipratropium-albuterol (DUONEB) 0.5-2.5 (3) MG/3ML SOLN Take 3 mLs by nebulization every 4 (four) hours as needed.   ketotifen (ZADITOR) 0.025 % ophthalmic solution Place 1 drop into both eyes 2 (two) times daily as needed (allergies).   montelukast (SINGULAIR) 10 MG tablet Take 1 tablet (10 mg total) by mouth at bedtime.   Oxycodone HCl 10 MG TABS Take 10 mg by mouth every 6 (six) hours.   Polyethyl Glycol-Propyl Glycol (SYSTANE OP) Place 1 drop into both eyes 3 (three) times daily as needed (dry/irritated eyes.).    polyethylene glycol (MIRALAX / GLYCOLAX) 17 g packet Take 17 g by mouth daily.   potassium chloride (KLOR-CON) 10 MEQ tablet Take 2 tablets (20 mEq total) by mouth daily. IN THE MORNING   pravastatin (PRAVACHOL) 10 MG tablet Take 10 mg by mouth daily.   torsemide (DEMADEX) 20 MG tablet Take 20 mg by mouth. Take 40 mg by mouth on Mon, and Friday. Take 20 mg on all other days.   vitamin C (ASCORBIC ACID) 500 MG tablet Take 500 mg by mouth daily.     Allergies:   Flexeril [cyclobenzaprine], Other, Keflex [cephalexin], Aspirin, Fentanyl, Hydrocodone, Adhesive [tape], and Chlorhexidine   Social History   Socioeconomic History   Marital status: Single    Spouse name: Not on file   Number of children: 3   Years of education: Not on file   Highest education level: Not on file  Occupational History   Not on file  Tobacco Use   Smoking status: Former     Packs/day: 2.00    Years: 40.00    Pack years: 80.00    Types: Cigarettes    Quit date: 01/19/1992    Years since quitting: 29.2   Smokeless tobacco: Never  Vaping Use   Vaping Use: Never used  Substance and Sexual Activity   Alcohol use: No   Drug use: No   Sexual activity: Never  Other Topics Concern   Not on file  Social History Narrative   Not on file   Social Determinants of Health   Financial Resource Strain: Not on file  Food Insecurity: Not on file  Transportation Needs: Not on file  Physical Activity: Not on file  Stress: Not on file  Social Connections: Not on file     Family History: The patient's family history includes Breast cancer in her sister; Colon cancer in her sister; Heart attack in her mother; Hypertension in her father and mother; Prostate cancer in her father; Stroke in her mother.  ROS:   Please see the history of present illness.     All other systems reviewed and are negative.   Risk Assessment/Calculations:           Physical Exam:    VS:  BP 130/64    Pulse 72    Ht 5' (1.524 m)    Wt 191 lb (86.6 kg)    SpO2 99%    BMI 37.30 kg/m     Wt Readings from Last 3 Encounters:  04/17/21 191 lb (86.6 kg)  03/27/21 183 lb (83 kg)  01/26/21 191 lb 4.8 oz (86.8 kg)     GEN:  Well nourished, well developed in no acute distress HEENT: Normal NECK: No JVD; No carotid bruits LYMPHATICS: No lymphadenopathy CARDIAC: RRR, no murmurs, rubs, gallops, generalized bilateral lower extremity edema RESPIRATORY:  Clear to  auscultation without rales, wheezing or rhonchi  ABDOMEN: Soft, non-tender, non-distended MUSCULOSKELETAL:  No edema; No deformity  SKIN: Warm and dry NEUROLOGIC:  Alert and oriented x 3 PSYCHIATRIC:  Normal affect    EKGs/Labs/Other Studies Reviewed:    The following studies were reviewed today:  Echocardiogram 08/01/2020  IMPRESSIONS     1. Left ventricular ejection fraction, by estimation, is 60 to 65%. The  left  ventricle has normal function. The left ventricle has no regional  wall motion abnormalities. Left ventricular diastolic parameters are  consistent with Grade I diastolic  dysfunction (impaired relaxation).   2. Right ventricular systolic function is normal. The right ventricular  size is normal.   3. The mitral valve is normal in structure. No evidence of mitral valve  regurgitation.   4. The aortic valve is tricuspid. Aortic valve regurgitation is not  visualized. No aortic stenosis is present.   5. The inferior vena cava is normal in size with greater than 50%  respiratory variability, suggesting right atrial pressure of 3 mmHg.   Comparison(s): A prior study was performed on 09/22/2015. No significant  change from prior study. Prior images reviewed side by side.  EKG:  EKG is  ordered today.  The ekg ordered today demonstrates sinus rhythm with premature atrial complexes 90 bpm  Recent Labs: 01/06/2021: B Natriuretic Peptide 50.0 01/07/2021: ALT 21; Hemoglobin 10.9; Platelets 243 01/09/2021: Magnesium 2.4 04/02/2021: BUN 24; Creatinine, Ser 1.52; Potassium 3.3; Sodium 136  Recent Lipid Panel No results found for: CHOL, TRIG, HDL, CHOLHDL, VLDL, LDLCALC, LDLDIRECT  ASSESSMENT & PLAN    Bilateral lower extremity edema-improved lower extremity edema.  Generalized bilateral extremity nonpitting edema.  Stable weight.  Amlodipine previously reduced to 5 mg daily due to lower extremity swelling.  Echocardiogram 08/01/2020 showed normal LVEF, G1 DD and no valvular abnormalities.  There was no change from her 09/22/2015 study.  Details above. Continue torsemide Heart healthy low-sodium diet-salty 6 given Increase physical activity as tolerated Lower extremity support stockings-[place] support stocking sheet given Elevate lower extremities when not active Daily weights-contact office with a weight increase of 3 pounds overnight or 5 pounds in 1 week  Essential hypertension-BP today  130/64.  Elevated morning afternoons at home and more noticeable with fluid volume overload. Continue amlodipine Heart healthy low-sodium diet-salty 6 given Increase physical activity as tolerated Start Coreg 3.125 BID Order BMP in 1 week  Chronic diastolic CHF-bilateral lower extremity generalized edema.  Weight stable.  Echocardiogram 5/22 showed normal EF and G1 DD.  Details above. Take torsemide Heart healthy low-sodium diet-salty 6 reviewed Increase physical activity as tolerated Fluid restriction 32-40 ounces daily Daily weights-contact office with a weight increase of 3 pounds overnight or 5 pounds in 1 week  Bilateral lower extremity edema-generalized bilateral extremity nonpitting edema.  Stable weight.  Echocardiogram 08/01/2020 showed normal LVEF, G1 DD and no valvular abnormalities.  There was no change from her 09/22/2015 study.  Details above. Increase torsemide to 40 mg Monday Wednesday Friday.  Increase potassium to 40 mEq Monday Wednesday Friday.  Continue normal dosing on other days. Heart healthy low-sodium diet-salty 6 given Increase physical activity as tolerated Lower extremity support stockings-[place] support stocking sheet given Elevate lower extremities when not active Daily weights-contact office with a weight increase of 3 pounds overnight or 5 pounds in 1 week  Hyperlipidemia-LDL 84 on 10/06/2020 Continue pravastatin Heart healthy low-sodium high-fiber diet Increase physical activity as tolerated Follows with PCP  Disposition: Follow-up with Dr. Percival Spanish as scheduled  Medication Adjustments/Labs and Tests Ordered: Current medicines are reviewed at length with the patient today.  Concerns regarding medicines are outlined above.  No orders of the defined types were placed in this encounter.  No orders of the defined types were placed in this encounter.      Signed, Deberah Pelton, NP  04/17/2021 2:28 PM      Notice: This dictation was  prepared with Dragon dictation along with smaller phrase technology. Any transcriptional errors that result from this process are unintentional and may not be corrected upon review.  I spent 15 minutes examining this patient, reviewing medications, and using patient centered shared decision making involving her cardiac care.  Prior to her visit I spent greater than 20 minutes reviewing her past medical history,  medications, and prior cardiac tests.

## 2021-04-16 NOTE — Progress Notes (Signed)
Glenda Terry ([id number]) Visit Report for 04/16/2021 Chief Complaint Document Details Patient Name: Date of Service: Glenda Terry 04/16/2021 2:45 PM Medical Record Number: [id number] Patient Account Number: [id number] Date of Birth/Sex: Treating RN: 1941-01-13 (81 y.o. Glenda Terry Primary Care Provider: Allyn Kenner Other Clinician: Referring Provider: Treating Provider/Extender: Dierdre Harness in Treatment: 3 Information Obtained from: Patient Chief Complaint 03/26/2021; Right lower extremity wound Following trauma Electronic Signature(s) Signed: 04/16/2021 3:28:16 PM By: Kalman Shan DO Entered By: Kalman Shan on 04/16/2021 15:22:53 -------------------------------------------------------------------------------- HPI Details Patient Name: Date of Service: Glenda Terry 04/16/2021 2:45 PM Medical Record Number: [id number] Patient Account Number: [id number] Date of Birth/Sex: Treating RN: 31-Mar-1940 (81 y.o. 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 She states that she hit her leg against a table And developed a wound. She has been following 2 weeks with a wound care center in [employer] for this issue. For the past 2 weeks she has had her legs wrapped with 3 layer compression and interchanging between Medihoney and silver alginate. She reports minimal improvement in wound healing. She  currently denies signs of infection. 1/19; patient presents for follow-up. She is tolerated the compression wrap well. She has no issues or complaints today. She denies signs of infection. 1/26; patient presents for follow-up. She has tolerated the compression wrap well. She is pleased with her wound healing. She denies signs of infection. 2/2; patient presents for follow-up. She has tolerated the compression wrap well and has no issues or complaints today. Electronic Signature(s) Signed: 04/16/2021 3:28:16 PM By: Kalman Shan DO Entered By: Kalman Shan on 04/16/2021 15:23:14 -------------------------------------------------------------------------------- Physical Exam Details Patient Name: Date of Service: Glenda Terry 04/16/2021 2:45 PM Medical Record Number: [id number] Patient Account Number: [id number] Date of Birth/Sex: Treating RN: 1940/04/12 (81 y.o. Glenda Terry Primary Care Provider: Allyn Kenner Other Clinician: Referring Provider: Treating Provider/Extender: Dierdre Harness in Treatment: 3 Constitutional respirations regular, non-labored and within target range for patient.. Cardiovascular 2+ dorsalis pedis/posterior tibialis pulses. Psychiatric pleasant and cooperative. Notes Right lower extremity: T the lateral proximal aspect of the leg there is an open wound with granulation tissue present. This appears well-healing. No signs of o surrounding infection. Electronic Signature(s) Signed: 04/16/2021 3:28:16 PM By: Kalman Shan DO Entered By: Kalman Shan on 04/16/2021 15:23:49 -------------------------------------------------------------------------------- Physician Orders Details Patient Name: Date of Service: Glenda Terry 04/16/2021 2:45 PM Medical Record Number: [id number] Patient Account Number: [id number] Date of Birth/Sex: Treating RN: Jan 20, 1941 (81 y.o. Glenda Terry Primary Care Provider: Allyn Kenner Other  Clinician: Referring Provider: Treating Provider/Extender: Dierdre Harness in Treatment: 3 Verbal / Phone Orders: No Diagnosis Coding ICD-10 Coding Code Description [phone] Non-pressure chronic ulcer of other part of right lower leg with fat layer exposed I87.2 Venous insufficiency (chronic) (peripheral) T79.8XXA Other early complications of trauma, initial encounter I10 Essential (primary) hypertension J44.9 Chronic obstructive pulmonary disease, unspecified M06.9 Rheumatoid arthritis, unspecified Follow-up Appointments ppointment in 1 week. - Dr. Heber McKinnon Return A Other: -  Bring in compression stocking for right leg. Bathing/ Shower/ Hygiene May shower with protection but do not get wound dressing(s) wet. - Ok to use Market researcher, can purchase at [facility], [facility], or [employer] Edema Control - Lymphedema / SCD / Other Elevate legs to the level of the heart or above for 30 minutes daily and/or when sitting, a frequency of: - throughout the day Avoid standing for long periods of time. Exercise regularly Moisturize legs daily. - left leg every night before bed. Compression stocking or Garment 20-30 mm/Hg pressure to: - left leg apply every night morning and remove at night. Wound Treatment Wound #1 - Lower Leg Wound Laterality: Right, Lateral Cleanser: Soap and Water 1 x Per Week/30 Days Discharge Instructions: May shower and wash wound with dial antibacterial soap and water prior to dressing change. Cleanser: Wound Cleanser 1 x Per Week/30 Days Discharge Instructions: Cleanse the wound with wound cleanser prior to applying a clean dressing using gauze sponges, not tissue or cotton balls. Peri-Wound Care: Triamcinolone 15 (g) 1 x Per Week/30 Days Discharge Instructions: Use triamcinolone 15 (g) as directed Peri-Wound Care: Sween Lotion (Moisturizing lotion) 1 x Per Week/30 Days Discharge Instructions: Apply moisturizing lotion as directed Topical: Gentamicin 1 x Per  Week/30 Days Discharge Instructions: As directed by physician Prim Dressing: Hydrofera Blue Ready Foam, 2.5 x2.5 in 1 x Per Week/30 Days ary Discharge Instructions: Apply to wound bed as instructed Secondary Dressing: Woven Gauze Sponge, Non-Sterile 4x4 in 1 x Per Week/30 Days Discharge Instructions: Apply over primary dressing as directed. Compression Wrap: Kerlix Roll 4.5x3.1 (in/yd) 1 x Per Week/30 Days Discharge Instructions: Apply Kerlix and Coban compression as directed. Compression Wrap: Coban Self-Adherent Wrap 4x5 (in/yd) 1 x Per Week/30 Days Discharge Instructions: Apply over Kerlix as directed. Electronic Signature(s) Signed: 04/16/2021 3:28:16 PM By: Kalman Shan DO Entered By: Kalman Shan on 04/16/2021 15:24:15 -------------------------------------------------------------------------------- Problem List Details Patient Name: Date of Service: Glenda Terry 04/16/2021 2:45 PM Medical Record Number: [id number] Patient Account Number: [id number] Date of Birth/Sex: Treating RN: Feb 19, 1941 (81 y.o. Glenda Terry Primary Care Provider: Allyn Kenner Other Clinician: Referring Provider: Treating Provider/Extender: Dierdre Harness in Treatment: 3 Active Problems ICD-10 Encounter Code Description Active Date MDM Diagnosis [phone] Non-pressure chronic ulcer of other part of right lower leg with fat layer 03/26/2021 No Yes exposed I87.2 Venous insufficiency (chronic) (peripheral) 03/26/2021 No Yes T79.8XXA Other early complications of trauma, initial encounter 03/26/2021 No Yes I10 Essential (primary) hypertension 03/26/2021 No Yes J44.9 Chronic obstructive pulmonary disease, unspecified 03/26/2021 No Yes M06.9 Rheumatoid arthritis, unspecified 03/26/2021 No Yes Inactive Problems Resolved Problems Electronic Signature(s) Signed: 04/16/2021 3:28:16 PM By: Kalman Shan DO Entered By: Kalman Shan on 04/16/2021  15:22:20 -------------------------------------------------------------------------------- Progress Note Details Patient Name: Date of Service: Glenda Terry. 04/16/2021 2:45 PM Medical Record Number: [id number] Patient Account Number: [id number] Date of Birth/Sex: Treating RN: 1940-08-18 (81 y.o. Glenda Terry Primary Care Provider: Allyn Kenner Other Clinician: Referring Provider: Treating Provider/Extender: Dierdre Harness in Treatment: 3 Subjective Chief Complaint Information obtained from Patient 03/26/2021; Right lower extremity wound Following trauma History of Present Illness (HPI) Admission 03/26/2021 Ms. 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 She states that she  hit her leg against a table And developed a wound. She has been following 2 weeks with a wound care center in [place] for this issue. For the past 2 weeks she has had her legs wrapped with 3 layer compression and interchanging between Medihoney and silver alginate. She reports minimal improvement in wound healing. She currently denies signs of infection. 1/19; patient presents for follow-up. She is tolerated the compression wrap well. She has no issues or complaints today. She denies signs of infection. 1/26; patient presents for follow-up. She has tolerated the compression wrap well. She is pleased with her wound healing. She denies signs of infection. 2/2; patient presents for follow-up. She has tolerated the compression wrap well and has no issues or complaints today. Patient History Information obtained from Patient. Family History Unknown History. Social History Former  smoker - quit 30 years ago, Alcohol Use - Never, Drug Use - No History, Caffeine Use - Rarely. Medical History Hematologic/Lymphatic Patient has history of Anemia Respiratory Patient has history of Chronic Obstructive Pulmonary Disease (COPD) Cardiovascular Patient has history of Hypertension Musculoskeletal Patient has history of Osteoarthritis Psychiatric Patient has history of Confinement Anxiety Medical A Surgical History Notes nd Gastrointestinal GERD Objective Constitutional respirations regular, non-labored and within target range for patient.. Vitals Time Taken: 3:05 PM, Height: 60 in, Weight: 180 lbs, BMI: 35.2, Temperature: 98.1 F, Pulse: 93 bpm, Respiratory Rate: 20 breaths/min, Blood Pressure: 165/91 mmHg. Cardiovascular 2+ dorsalis pedis/posterior tibialis pulses. Psychiatric pleasant and cooperative. General Notes: Right lower extremity: T the lateral proximal aspect of the leg there is an open wound with granulation tissue present. This appears well-healing. o No signs of surrounding infection. Integumentary (Hair, Skin) Wound #1 status is Open. Original cause of wound was Trauma. The date acquired was: 02/14/2021. The wound has been in treatment 3 weeks. The wound is located on the Right,Lateral Lower Leg. The wound measures 0.1cm length x 0.1cm width x 0.1cm depth; 0.008cm^2 area and 0.001cm^3 volume. There is Fat Layer (Subcutaneous Tissue) exposed. There is no tunneling or undermining noted. There is a small amount of serosanguineous drainage noted. The wound margin is distinct with the outline attached to the wound base. There is large (67-100%) pink granulation within the wound bed. There is no necrotic tissue within the wound bed. Assessment Active Problems ICD-10 Non-pressure chronic ulcer of other part of right lower leg with fat layer exposed Venous insufficiency (chronic) (peripheral) Other early complications of trauma, initial encounter Essential  (primary) hypertension Chronic obstructive pulmonary disease, unspecified Rheumatoid arthritis, unspecified Patient has done very well with Hydrofera Blue under compression therapy. Her wound is almost healed. I recommended continuing with the previous treatment and follow-up in 1 week. I asked her to bring her compression stockings with her at next clinic visit. Plan Follow-up Appointments: Return Appointment in 1 week. - Dr. Heber Cross Other: - Bring in compression stocking for right leg. Bathing/ Shower/ Hygiene: May shower with protection but do not get wound dressing(s) wet. - Ok to use Market researcher, can purchase at [facility], [facility], or [employer] Edema Control - Lymphedema / SCD / Other: Elevate legs to the level of the heart or above for 30 minutes daily and/or when sitting, a frequency of: - throughout the day Avoid standing for long periods of time. Exercise regularly Moisturize legs daily. - left leg every night before bed. Compression stocking or Garment 20-30 mm/Hg pressure to: - left leg apply every night morning and remove at night. WOUND #1: - Lower Leg Wound Laterality: Right,  Lateral Cleanser: Soap and Water 1 x Per Week/30 Days Discharge Instructions: May shower and wash wound with dial antibacterial soap and water prior to dressing change. Cleanser: Wound Cleanser 1 x Per Week/30 Days Discharge Instructions: Cleanse the wound with wound cleanser prior to applying a clean dressing using gauze sponges, not tissue or cotton balls. Peri-Wound Care: Triamcinolone 15 (g) 1 x Per Week/30 Days Discharge Instructions: Use triamcinolone 15 (g) as directed Peri-Wound Care: Sween Lotion (Moisturizing lotion) 1 x Per Week/30 Days Discharge Instructions: Apply moisturizing lotion as directed Topical: Gentamicin 1 x Per Week/30 Days Discharge Instructions: As directed by physician Prim Dressing: Hydrofera Blue Ready Foam, 2.5 x2.5 in 1 x Per Week/30 Days ary Discharge Instructions: Apply  to wound bed as instructed Secondary Dressing: Woven Gauze Sponge, Non-Sterile 4x4 in 1 x Per Week/30 Days Discharge Instructions: Apply over primary dressing as directed. Com pression Wrap: Kerlix Roll 4.5x3.1 (in/yd) 1 x Per Week/30 Days Discharge Instructions: Apply Kerlix and Coban compression as directed. Com pression Wrap: Coban Self-Adherent Wrap 4x5 (in/yd) 1 x Per Week/30 Days Discharge Instructions: Apply over Kerlix as directed. 1. Hydrofera Blue under Kerlix/Coban 2. Follow-up in 1 week. Electronic Signature(s) Signed: 04/16/2021 3:28:16 PM By: Kalman Shan DO Entered By: Kalman Shan on 04/16/2021 15:27:40 -------------------------------------------------------------------------------- HxROS Details Patient Name: Date of Service: Glenda Terry 04/16/2021 2:45 PM Medical Record Number: [id number] Patient Account Number: [id number] Date of Birth/Sex: Treating RN: [birth year] (81 y.o. Glenda Terry, Glenda Terry Primary Care Provider: Allyn Kenner Other Clinician: Referring Provider: Treating Provider/Extender: Dierdre Harness in Treatment: 3 Information Obtained From Patient Hematologic/Lymphatic Medical History: Positive for: Anemia Respiratory Medical History: Positive for: Chronic Obstructive Pulmonary Disease (COPD) Cardiovascular Medical History: Positive for: Hypertension Gastrointestinal Medical History: Past Medical History Notes: GERD Musculoskeletal Medical History: Positive for: Osteoarthritis Psychiatric Medical History: Positive for: Confinement Anxiety Immunizations Pneumococcal Vaccine: Received Pneumococcal Vaccination: Yes Received Pneumococcal Vaccination On or After 60th Birthday: Yes Implantable Devices None Family and Social History Unknown History: Yes; Former smoker - quit 30 years ago; Alcohol Use: Never; Drug Use: No History; Caffeine Use: Rarely; Financial Concerns: No; Food, Clothing or Shelter Needs: No;  Support System Lacking: No; Transportation Concerns: No Electronic Signature(s) Signed: 04/16/2021 3:28:16 PM By: Kalman Shan DO Signed: 04/16/2021 4:09:06 PM By: Deon Pilling RN, BSN Entered By: Kalman Shan on 04/16/2021 15:23:20 -------------------------------------------------------------------------------- SuperBill Details Patient Name: Date of Service: Glenda Terry 04/16/2021 Medical Record Number: [id number] Patient Account Number: [id number] Date of Birth/Sex: Treating RN: 05-11-1940 (81 y.o. Glenda Terry Primary Care Provider: Allyn Kenner Other Clinician: Referring Provider: Treating Provider/Extender: Dierdre Harness in Treatment: 3 Diagnosis Coding ICD-10 Codes Code Description [phone] Non-pressure chronic ulcer of other part of right lower leg with fat layer exposed I87.2 Venous insufficiency (chronic) (peripheral) T79.8XXA Other early complications of trauma, initial encounter I10 Essential (primary) hypertension J44.9 Chronic obstructive pulmonary disease, unspecified M06.9 Rheumatoid arthritis, unspecified Facility Procedures CPT4 Code: [id number] Description: 99214 - WOUND CARE VISIT-LEV 4 EST PT Modifier: Quantity: 1 Physician Procedures : CPT4 Code Description Modifier [id number] - WC PHYS LEVEL 3 - EST PT ICD-10 Diagnosis Description L97.812 Non-pressure chronic ulcer of other part of right lower leg with fat layer exposed I87.2 Venous insufficiency (chronic) (peripheral) T79.8XXA  Other early complications of trauma, initial encounter M06.9 Rheumatoid arthritis, unspecified Quantity: 1 Electronic Signature(s) Signed: 04/16/2021 3:28:16 PM By: Kalman Shan DO Entered By: Kalman Shan on 04/16/2021 15:28:02

## 2021-04-16 NOTE — Progress Notes (Signed)
TIMIKO, OFFUTT ([id number]) Visit Report for 04/16/2021 Arrival Information Details Patient Name: Date of Service: Glenda Terry, Glenda Terry 04/16/2021 2:45 PM Medical Record Number: [id number] Patient Account Number: [id number] Date of Birth/Sex: Treating RN: 01-Sep-1940 (81 y.o. Glenda Terry, Meta.Reding Primary Care Natacia Chaisson: Allyn Kenner Other Clinician: Referring Caylin Raby: Treating Camran Keady/Extender: Dierdre Harness in Treatment: 3 Visit Information History Since Last Visit Added or deleted any medications: No Patient Arrived: Glenda Terry Any new allergies or adverse reactions: No Arrival Time: 15:07 Had a fall or experienced change in No Accompanied By: daughter activities of daily living that may affect Transfer Assistance: None risk of falls: Patient Identification Verified: Yes Signs or symptoms of abuse/neglect since last visito No Secondary Verification Process Completed: Yes Hospitalized since last visit: No Patient Requires Transmission-Based Precautions: No Implantable device outside of the clinic excluding No Patient Has Alerts: No cellular tissue based products placed in the center since last visit: Has Dressing in Place as Prescribed: Yes Has Compression in Place as Prescribed: Yes Pain Present Now: No Electronic Signature(s) Signed: 04/16/2021 4:09:06 PM By: Deon Pilling RN, BSN Entered By: Deon Pilling on 04/16/2021 15:08:13 -------------------------------------------------------------------------------- Clinic Level of Care Assessment Details Patient Name: Date of Service: Glenda Terry. 04/16/2021 2:45 PM Medical Record Number: [id number] Patient Account Number: [id number] Date of Birth/Sex: Treating RN: 1941/01/04 (81 y.o. Glenda Terry, Meta.Reding Primary Care Josi Roediger: Allyn Kenner Other Clinician: Referring Roosevelt Eimers: Treating Modean Mccullum/Extender: Dierdre Harness in Treatment: 3 Clinic Level of Care Assessment Items TOOL 4 Quantity  Score X- 1 0 Use when only an EandM is performed on FOLLOW-UP visit ASSESSMENTS - Nursing Assessment / Reassessment X- 1 10 Reassessment of Co-morbidities (includes updates in patient status) X- 1 5 Reassessment of Adherence to Treatment Plan ASSESSMENTS - Wound and Skin A ssessment / Reassessment X - Simple Wound Assessment / Reassessment - one wound 1 5 []  - 0 Complex Wound Assessment / Reassessment - multiple wounds X- 1 10 Dermatologic / Skin Assessment (not related to wound area) ASSESSMENTS - Focused Assessment X- 1 5 Circumferential Edema Measurements - multi extremities X- 1 10 Nutritional Assessment / Counseling / Intervention []  - 0 Lower Extremity Assessment (monofilament, tuning fork, pulses) []  - 0 Peripheral Arterial Disease Assessment (using hand held doppler) ASSESSMENTS - Ostomy and/or Continence Assessment and Care []  - 0 Incontinence Assessment and Management []  - 0 Ostomy Care Assessment and Management (repouching, etc.) PROCESS - Coordination of Care X - Simple Patient / Family Education for ongoing care 1 15 []  - 0 Complex (extensive) Patient / Family Education for ongoing care X- 1 10 Staff obtains Programmer, systems, Records, T Results / Process Orders est []  - 0 Staff telephones HHA, Nursing Homes / Clarify orders / etc []  - 0 Routine Transfer to another Facility (non-emergent condition) []  - 0 Routine Hospital Admission (non-emergent condition) []  - 0 New Admissions / Biomedical engineer / Ordering NPWT Apligraf, etc. , []  - 0 Emergency Hospital Admission (emergent condition) X- 1 10 Simple Discharge Coordination []  - 0 Complex (extensive) Discharge Coordination PROCESS - Special Needs []  - 0 Pediatric / Minor Patient Management []  - 0 Isolation Patient Management []  - 0 Hearing / Language / Visual special needs []  - 0 Assessment of Community assistance (transportation, D/C planning, etc.) []  - 0 Additional assistance / Altered  mentation []  - 0 Support Surface(s) Assessment (bed, cushion, seat, etc.) INTERVENTIONS - Wound Cleansing / Measurement X - Simple Wound Cleansing - one wound 1 5 []  -  0 Complex Wound Cleansing - multiple wounds X- 1 5 Wound Imaging (photographs - any number of wounds) []  - 0 Wound Tracing (instead of photographs) X- 1 5 Simple Wound Measurement - one wound []  - 0 Complex Wound Measurement - multiple wounds INTERVENTIONS - Wound Dressings []  - 0 Small Wound Dressing one or multiple wounds []  - 0 Medium Wound Dressing one or multiple wounds X- 1 20 Large Wound Dressing one or multiple wounds []  - 0 Application of Medications - topical []  - 0 Application of Medications - injection INTERVENTIONS - Miscellaneous []  - 0 External ear exam []  - 0 Specimen Collection (cultures, biopsies, blood, body fluids, etc.) []  - 0 Specimen(s) / Culture(s) sent or taken to Lab for analysis []  - 0 Patient Transfer (multiple staff / Civil Service fast streamer / Similar devices) []  - 0 Simple Staple / Suture removal (25 or less) []  - 0 Complex Staple / Suture removal (26 or more) []  - 0 Hypo / Hyperglycemic Management (close monitor of Blood Glucose) []  - 0 Ankle / Brachial Index (ABI) - do not check if billed separately X- 1 5 Vital Signs Has the patient been seen at the hospital within the last three years: Yes Total Score: 120 Level Of Care: New/Established - Level 4 Electronic Signature(s) Signed: 04/16/2021 4:09:06 PM By: Deon Pilling RN, BSN Entered By: Deon Pilling on 04/16/2021 15:21:25 -------------------------------------------------------------------------------- Encounter Discharge Information Details Patient Name: Date of Service: Glenda Terry. 04/16/2021 2:45 PM Medical Record Number: [id number] Patient Account Number: [id number] Date of Birth/Sex: Treating RN: 09/11/1940 (81 y.o. Glenda Terry Primary Care Genowefa Morga: Allyn Kenner Other Clinician: Referring Jakiah Goree: Treating  Clent Damore/Extender: Dierdre Harness in Treatment: 3 Encounter Discharge Information Items Discharge Condition: Stable Ambulatory Status: Cane Discharge Destination: Home Transportation: Other Accompanied By: daughter Schedule Follow-up Appointment: Yes Clinical Summary of Care: Electronic Signature(s) Signed: 04/16/2021 4:09:06 PM By: Deon Pilling RN, BSN Entered By: Deon Pilling on 04/16/2021 15:22:27 -------------------------------------------------------------------------------- Lower Extremity Assessment Details Patient Name: Date of Service: Glenda Terry. 04/16/2021 2:45 PM Medical Record Number: [id number] Patient Account Number: [id number] Date of Birth/Sex: Treating RN: 1940/10/21 (81 y.o. Glenda Terry Primary Care Tien Spooner: Allyn Kenner Other Clinician: Referring Antoinette Haskett: Treating Pakou Rainbow/Extender: Dierdre Harness in Treatment: 3 Edema Assessment Assessed: [Left: No] [Right: Yes] Edema: [Left: Ye] [Right: s] Calf Left: Right: Point of Measurement: 34 cm From Medial Instep 33 cm Ankle Left: Right: Point of Measurement: 11 cm From Medial Instep 21 cm Vascular Assessment Pulses: Dorsalis Pedis Palpable: [Right:Yes] Electronic Signature(s) Signed: 04/16/2021 4:09:06 PM By: Deon Pilling RN, BSN Entered By: Deon Pilling on 04/16/2021 15:09:15 -------------------------------------------------------------------------------- Multi Wound Chart Details Patient Name: Date of Service: Glenda Terry. 04/16/2021 2:45 PM Medical Record Number: [id number] Patient Account Number: [id number] Date of Birth/Sex: Treating RN: 12/10/1940 (81 y.o. Glenda Terry Primary Care Ayomikun Starling: Allyn Kenner Other Clinician: Referring Balbina Depace: Treating Haylen Shelnutt/Extender: Dierdre Harness in Treatment: 3 Vital Signs Height(in): 60 Pulse(bpm): 58 Weight(lbs): 180 Blood Pressure(mmHg): 165/91 Body Mass Index(BMI):  35.2 Temperature(F): 98.1 Respiratory Rate(breaths/min): 20 Photos: [N/A:N/A] Right, Lateral Lower Leg N/A N/A Wound Location: Trauma N/A N/A Wounding Event: Venous Leg Ulcer N/A N/A Primary Etiology: Anemia, Chronic Obstructive N/A N/A Comorbid History: Pulmonary Disease (COPD), Hypertension, Osteoarthritis, Confinement Anxiety 02/14/2021 N/A N/A Date Acquired: 3 N/A N/A Weeks of Treatment: Open N/A N/A Wound Status: No N/A N/A Wound Recurrence: 0.1x0.1x0.1 N/A N/A Measurements L x W x D (cm)  0.008 N/A N/A A (cm) : rea 0.001 N/A N/A Volume (cm) : 99.60% N/A N/A % Reduction in Area: 99.70% N/A N/A % Reduction in Volume: Full Thickness Without Exposed N/A N/A Classification: Support Structures Small N/A N/A Exudate Amount: Serosanguineous N/A N/A Exudate Type: red, brown N/A N/A Exudate Color: Distinct, outline attached N/A N/A Wound Margin: Large (67-100%) N/A N/A Granulation Amount: Pink N/A N/A Granulation Quality: None Present (0%) N/A N/A Necrotic Amount: Fat Layer (Subcutaneous Tissue): Yes N/A N/A Exposed Structures: Fascia: No Tendon: No Muscle: No Joint: No Bone: No Large (67-100%) N/A N/A Epithelialization: Treatment Notes Wound #1 (Lower Leg) Wound Laterality: Right, Lateral Cleanser Soap and Water Discharge Instruction: May shower and wash wound with dial antibacterial soap and water prior to dressing change. Wound Cleanser Discharge Instruction: Cleanse the wound with wound cleanser prior to applying a clean dressing using gauze sponges, not tissue or cotton balls. Peri-Wound Care Triamcinolone 15 (g) Discharge Instruction: Use triamcinolone 15 (g) as directed Sween Lotion (Moisturizing lotion) Discharge Instruction: Apply moisturizing lotion as directed Topical Gentamicin Discharge Instruction: As directed by physician Primary Dressing Hydrofera Blue Ready Foam, 2.5 x2.5 in Discharge Instruction: Apply to wound bed as  instructed Secondary Dressing Woven Gauze Sponge, Non-Sterile 4x4 in Discharge Instruction: Apply over primary dressing as directed. Secured With Compression Wrap Kerlix Roll 4.5x3.1 (in/yd) Discharge Instruction: Apply Kerlix and Coban compression as directed. Coban Self-Adherent Wrap 4x5 (in/yd) Discharge Instruction: Apply over Kerlix as directed. Compression Stockings Add-Ons Electronic Signature(s) Signed: 04/16/2021 3:28:16 PM By: Kalman Shan DO Signed: 04/16/2021 4:09:06 PM By: Deon Pilling RN, BSN Entered By: Kalman Shan on 04/16/2021 15:22:31 -------------------------------------------------------------------------------- [place] Details Patient Name: Date of Service: Glenda Terry 04/16/2021 2:45 PM Medical Record Number: [id number] Patient Account Number: [id number] Date of Birth/Sex: Treating RN: [birth year] (81 y.o. Glenda Terry, Glenda Terry Primary Care Levana Minetti: Allyn Kenner Other Clinician: Referring Marcena Dias: Treating Milind Raether/Extender: Dierdre Harness in Treatment: 3 Multidisciplinary Care Plan reviewed with physician Active Inactive Abuse / Safety / Falls / Self Care Management Nursing Diagnoses: Potential for falls Potential for injury related to falls Goals: Patient will not experience any injury related to falls Date Initiated: 03/26/2021 Target Resolution Date: 05/08/2021 Goal Status: Active Patient/caregiver will verbalize/demonstrate measures taken to prevent injury and/or falls Date Initiated: 03/26/2021 Target Resolution Date: 04/30/2021 Goal Status: Active Interventions: Assess Activities of Daily Living upon admission and as needed Assess fall risk on admission and as needed Assess: immobility, friction, shearing, incontinence upon admission and as needed Assess impairment of mobility on admission and as needed per policy Assess personal safety and home safety (as indicated) on admission and as  needed Assess self care needs on admission and as needed Provide education on fall prevention Provide education on personal and home safety Notes: Venous Leg Ulcer Nursing Diagnoses: Knowledge deficit related to disease process and management Goals: Patient will maintain optimal edema control Date Initiated: 03/26/2021 Target Resolution Date: 04/30/2021 Goal Status: Active Interventions: Assess peripheral edema status every visit. Compression as ordered Provide education on venous insufficiency Notes: Wound/Skin Impairment Nursing Diagnoses: Impaired tissue integrity Knowledge deficit related to ulceration/compromised skin integrity Goals: Patient/caregiver will verbalize understanding of skin care regimen Date Initiated: 03/26/2021 Target Resolution Date: 04/30/2021 Goal Status: Active Ulcer/skin breakdown will have a volume reduction of 30% by week 4 Date Initiated: 03/26/2021 Target Resolution Date: 04/24/2021 Goal Status: Active Interventions: Assess patient/caregiver ability to obtain necessary supplies Assess patient/caregiver ability to perform ulcer/skin care regimen upon admission and  as needed Assess ulceration(s) every visit Provide education on ulcer and skin care Notes: Electronic Signature(s) Signed: 04/16/2021 4:09:06 PM By: Deon Pilling RN, BSN Entered By: Deon Pilling on 04/16/2021 15:12:28 -------------------------------------------------------------------------------- Pain Assessment Details Patient Name: Date of Service: Glenda Terry. 04/16/2021 2:45 PM Medical Record Number: [id number] Patient Account Number: [id number] Date of Birth/Sex: Treating RN: 1940-09-20 (81 y.o. Glenda Terry Primary Care Gretel Cantu: Other Clinician: Allyn Kenner Referring Lotta Frankenfield: Treating Daianna Vasques/Extender: Dierdre Harness in Treatment: 3 Active Problems Location of Pain Severity and Description of Pain Patient Has Paino No Site  Locations Rate the pain. Current Pain Level: 0 Pain Management and Medication Current Pain Management: Medication: No Cold Application: No Rest: No Massage: No Activity: No T.E.N.S.: No Heat Application: No Leg drop or elevation: No Is the Current Pain Management Adequate: Adequate How does your wound impact your activities of daily livingo Sleep: No Bathing: No Appetite: No Relationship With Others: No Bladder Continence: No Emotions: No Bowel Continence: No Work: No Toileting: No Drive: No Dressing: No Hobbies: No Engineer, maintenance) Signed: 04/16/2021 4:09:06 PM By: Deon Pilling RN, BSN Entered By: Deon Pilling on 04/16/2021 15:08:46 -------------------------------------------------------------------------------- Patient/Caregiver Education Details Patient Name: Date of Service: Glenda Terry 2/2/2023andnbsp2:45 PM Medical Record Number: [id number] Patient Account Number: [id number] Date of Birth/Gender: Treating RN: [birth year] (81 y.o. Glenda Terry Primary Care Physician: Allyn Kenner Other Clinician: Referring Physician: Treating Physician/Extender: Dierdre Harness in Treatment: 3 Education Assessment Education Provided To: Patient Education Topics Provided Wound/Skin Impairment: Handouts: Skin Care Do's and Dont's Methods: Explain/Verbal Responses: Reinforcements needed Electronic Signature(s) Signed: 04/16/2021 4:09:06 PM By: Deon Pilling RN, BSN Entered By: Deon Pilling on 04/16/2021 15:12:44 -------------------------------------------------------------------------------- Wound Assessment Details Patient Name: Date of Service: Glenda Terry. 04/16/2021 2:45 PM Medical Record Number: [id number] Patient Account Number: [id number] Date of Birth/Sex: Treating RN: [birth year] (81 y.o. Glenda Terry, Meta.Reding Primary Care Siomara Burkel: Allyn Kenner Other Clinician: Referring Takina Busser: Treating Anay Walter/Extender: Dierdre Harness in Treatment: 3 Wound Status Wound Number: 1 Primary Venous Leg Ulcer Etiology: Wound Location: Right, Lateral Lower Leg Wound Open Wounding Event: Trauma Status: Date Acquired: 02/14/2021 Comorbid Anemia, Chronic Obstructive Pulmonary Disease (COPD), Weeks Of Treatment: 3 History: Hypertension, Osteoarthritis, Confinement Anxiety Clustered Wound: No Photos Wound Measurements Length: (cm) 0.1 Width: (cm) 0.1 Depth: (cm) 0.1 Area: (cm) 0.008 Volume: (cm) 0.001 % Reduction in Area: 99.6% % Reduction in Volume: 99.7% Epithelialization: Large (67-100%) Tunneling: No Undermining: No Wound Description Classification: Full Thickness Without Exposed Support Structures Wound Margin: Distinct, outline attached Exudate Amount: Small Exudate Type: Serosanguineous Exudate Color: red, brown Foul Odor After Cleansing: No Slough/Fibrino No Wound Bed Granulation Amount: Large (67-100%) Exposed Structure Granulation Quality: Pink Fascia Exposed: No Necrotic Amount: None Present (0%) Fat Layer (Subcutaneous Tissue) Exposed: Yes Tendon Exposed: No Muscle Exposed: No Joint Exposed: No Bone Exposed: No Treatment Notes Wound #1 (Lower Leg) Wound Laterality: Right, Lateral Cleanser Soap and Water Discharge Instruction: May shower and wash wound with dial antibacterial soap and water prior to dressing change. Wound Cleanser Discharge Instruction: Cleanse the wound with wound cleanser prior to applying a clean dressing using gauze sponges, not tissue or cotton balls. Peri-Wound Care Triamcinolone 15 (g) Discharge Instruction: Use triamcinolone 15 (g) as directed Sween Lotion (Moisturizing lotion) Discharge Instruction: Apply moisturizing lotion as directed Topical Gentamicin Discharge Instruction: As directed by physician Primary Dressing Hydrofera Blue Ready Foam, 2.5 x2.5 in Discharge Instruction: Apply to wound  bed as instructed Secondary  Dressing Woven Gauze Sponge, Non-Sterile 4x4 in Discharge Instruction: Apply over primary dressing as directed. Secured With Compression Wrap Kerlix Roll 4.5x3.1 (in/yd) Discharge Instruction: Apply Kerlix and Coban compression as directed. Coban Self-Adherent Wrap 4x5 (in/yd) Discharge Instruction: Apply over Kerlix as directed. Compression Stockings Add-Ons Electronic Signature(s) Signed: 04/16/2021 4:09:06 PM By: Deon Pilling RN, BSN Entered By: Deon Pilling on 04/16/2021 15:11:44 -------------------------------------------------------------------------------- Vitals Details Patient Name: Date of Service: Glenda Terry 04/16/2021 2:45 PM Medical Record Number: [id number] Patient Account Number: [id number] Date of Birth/Sex: Treating RN: 1940-06-26 (81 y.o. Glenda Terry, Glenda Terry Primary Care Tyreshia Ingman: Allyn Kenner Other Clinician: Referring Sargon Scouten: Treating Islam Villescas/Extender: Dierdre Harness in Treatment: 3 Vital Signs Time Taken: 15:05 Temperature (F): 98.1 Height (in): 60 Pulse (bpm): 93 Weight (lbs): 180 Respiratory Rate (breaths/min): 20 Body Mass Index (BMI): 35.2 Blood Pressure (mmHg): 165/91 Reference Range: 80 - 120 mg / dl Electronic Signature(s) Signed: 04/16/2021 4:09:06 PM By: Deon Pilling RN, BSN Entered By: Deon Pilling on 04/16/2021 15:08:37

## 2021-04-17 ENCOUNTER — Ambulatory Visit (INDEPENDENT_AMBULATORY_CARE_PROVIDER_SITE_OTHER): Payer: PPO | Admitting: General Practice

## 2021-04-17 ENCOUNTER — Encounter (HOSPITAL_COMMUNITY): Payer: Self-pay | Admitting: Physical Therapy

## 2021-04-17 ENCOUNTER — Encounter (HOSPITAL_BASED_OUTPATIENT_CLINIC_OR_DEPARTMENT_OTHER): Payer: Self-pay | Admitting: General Practice

## 2021-04-17 VITALS — BP 130/64 | HR 72 | Ht 60.0 in | Wt 191.0 lb

## 2021-04-17 DIAGNOSIS — M7989 Other specified soft tissue disorders: Secondary | ICD-10-CM

## 2021-04-17 DIAGNOSIS — I10 Essential (primary) hypertension: Secondary | ICD-10-CM | POA: Diagnosis not present

## 2021-04-17 DIAGNOSIS — I5032 Chronic diastolic (congestive) heart failure: Secondary | ICD-10-CM | POA: Diagnosis not present

## 2021-04-17 MED ORDER — CARVEDILOL 3.125 MG PO TABS: 3.1250 mg | ORAL_TABLET | Freq: Two times a day (BID) | ORAL | 3 refills | Status: DC

## 2021-04-17 MED ORDER — TORSEMIDE 20 MG PO TABS
ORAL_TABLET | ORAL | 3 refills | Status: DC
Start: 2021-04-17 — End: 2021-10-23

## 2021-04-17 MED ORDER — POTASSIUM CHLORIDE ER 10 MEQ PO TBCR: EXTENDED_RELEASE_TABLET | ORAL | 3 refills | Status: DC

## 2021-04-17 NOTE — Patient Instructions (Addendum)
Medication Instructions:  INCREASE- Torsemide 40 mg by mouth Monday, Wednesday and Friday and 20 mg by mouth other days INCREASE- Potassium 40 mg by mouth Monday, Wednesday and Friday and take 20 mg by mouth other days START- Carvedilol 3.125 mg by mouth twice a day  *If you need a refill on your cardiac medications before your next appointment, please call your pharmacy*   Lab Work: BMP in 1 week  If you have labs (blood work) drawn today and your tests are completely normal, you will receive your results only by: [place] (if you have MyChart) OR A paper copy in the mail If you have any lab test that is abnormal or we need to change your treatment, we will call you to review the results.   Testing/Procedures: None Ordered   Follow-Up: At [facility], you and your health needs are our priority.  As part of our continuing mission to provide you with exceptional heart care, we have created designated Provider Care Teams.  These Care Teams include your primary Cardiologist (physician) and Advanced Practice Providers (APPs -  Physician Assistants and Nurse Practitioners) who all work together to provide you with the care you need, when you need it.  We recommend signing up for the patient portal called "MyChart".  Sign up information is provided on this After Visit Summary.  MyChart is used to connect with patients for Virtual Visits (Telemedicine).  Patients are able to view lab/test results, encounter notes, upcoming appointments, etc.  Non-urgent messages can be sent to your provider as well.   To learn more about what you can do with MyChart, go to [email or web address].    Your next appointment:   Keep appointment on February 24th @ 2:20 PM  The format for your next appointment:   In Person  Provider:   Minus Breeding, MD   Other Instructions Keep daily weight check Fluids restriction up to 32-48 once a day

## 2021-04-23 ENCOUNTER — Encounter (HOSPITAL_BASED_OUTPATIENT_CLINIC_OR_DEPARTMENT_OTHER): Payer: PPO | Admitting: Internal Medicine

## 2021-04-23 ENCOUNTER — Other Ambulatory Visit: Payer: Self-pay

## 2021-04-23 DIAGNOSIS — M069 Rheumatoid arthritis, unspecified: Secondary | ICD-10-CM

## 2021-04-23 DIAGNOSIS — T798XXA Other early complications of trauma, initial encounter: Secondary | ICD-10-CM

## 2021-04-23 DIAGNOSIS — L97812 Non-pressure chronic ulcer of other part of right lower leg with fat layer exposed: Secondary | ICD-10-CM

## 2021-04-23 DIAGNOSIS — I872 Venous insufficiency (chronic) (peripheral): Secondary | ICD-10-CM | POA: Diagnosis not present

## 2021-04-23 NOTE — Progress Notes (Signed)
Glenda Terry, Glenda Terry ([id number]) Visit Report for 04/23/2021 Arrival Information Details Patient Name: Date of Service: Glenda Terry, Glenda Terry 04/23/2021 9:30 A M Medical Record Number: [id number] Patient Account Number: [id number] Date of Birth/Sex: Treating RN: 11/15/1940 (81 y.o. Helene Shoe, Tammi Klippel Primary Care Rasheida Broden: Allyn Kenner Other Clinician: Referring Oakley Kossman: Treating Jamil Armwood/Extender: Dierdre Harness in Treatment: 4 Visit Information History Since Last Visit Added or deleted any medications: Yes Patient Arrived: Cane Any new allergies or adverse reactions: No Arrival Time: 09:28 Had a fall or experienced change in No Accompanied By: daughter activities of daily living that may affect Transfer Assistance: None risk of falls: Patient Identification Verified: Yes Signs or symptoms of abuse/neglect since last visito No Secondary Verification Process Completed: Yes Hospitalized since last visit: No Patient Requires Transmission-Based Precautions: No Implantable device outside of the clinic excluding No Patient Has Alerts: No cellular tissue based products placed in the center since last visit: Has Dressing in Place as Prescribed: Yes Has Compression in Place as Prescribed: Yes Pain Present Now: No Electronic Signature(s) Signed: 04/23/2021 5:47:56 PM By: Deon Pilling RN, BSN Entered By: Deon Pilling on 04/23/2021 09:28:34 -------------------------------------------------------------------------------- Clinic Level of Care Assessment Details Patient Name: Date of Service: Glenda Arena. 04/23/2021 9:30 A M Medical Record Number: [id number] Patient Account Number: [id number] Date of Birth/Sex: Treating RN: 24-Feb-1941 (81 y.o. Helene Shoe, Meta.Reding Primary Care Niranjan Rufener: Allyn Kenner Other Clinician: Referring Jacub Waiters: Treating Aviv Rota/Extender: Dierdre Harness in Treatment: 4 Clinic Level of Care Assessment Items TOOL 4 Quantity  Score X- 1 0 Use when only an EandM is performed on FOLLOW-UP visit ASSESSMENTS - Nursing Assessment / Reassessment X- 1 10 Reassessment of Co-morbidities (includes updates in patient status) X- 1 5 Reassessment of Adherence to Treatment Plan ASSESSMENTS - Wound and Skin A ssessment / Reassessment X - Simple Wound Assessment / Reassessment - one wound 1 5 []  - 0 Complex Wound Assessment / Reassessment - multiple wounds X- 1 10 Dermatologic / Skin Assessment (not related to wound area) ASSESSMENTS - Focused Assessment X- 1 5 Circumferential Edema Measurements - multi extremities []  - 0 Nutritional Assessment / Counseling / Intervention []  - 0 Lower Extremity Assessment (monofilament, tuning fork, pulses) []  - 0 Peripheral Arterial Disease Assessment (using hand held doppler) ASSESSMENTS - Ostomy and/or Continence Assessment and Care []  - 0 Incontinence Assessment and Management []  - 0 Ostomy Care Assessment and Management (repouching, etc.) PROCESS - Coordination of Care X - Simple Patient / Family Education for ongoing care 1 15 []  - 0 Complex (extensive) Patient / Family Education for ongoing care X- 1 10 Staff obtains Programmer, systems, Records, T Results / Process Orders est []  - 0 Staff telephones HHA, Nursing Homes / Clarify orders / etc []  - 0 Routine Transfer to another Facility (non-emergent condition) []  - 0 Routine Hospital Admission (non-emergent condition) []  - 0 New Admissions / Biomedical engineer / Ordering NPWT Apligraf, etc. , []  - 0 Emergency Hospital Admission (emergent condition) X- 1 10 Simple Discharge Coordination []  - 0 Complex (extensive) Discharge Coordination PROCESS - Special Needs []  - 0 Pediatric / Minor Patient Management []  - 0 Isolation Patient Management []  - 0 Hearing / Language / Visual special needs []  - 0 Assessment of Community assistance (transportation, D/C planning, etc.) []  - 0 Additional assistance / Altered  mentation []  - 0 Support Surface(s) Assessment (bed, cushion, seat, etc.) INTERVENTIONS - Wound Cleansing / Measurement X - Simple Wound Cleansing - one wound  1 5 []  - 0 Complex Wound Cleansing - multiple wounds X- 1 5 Wound Imaging (photographs - any number of wounds) []  - 0 Wound Tracing (instead of photographs) X- 1 5 Simple Wound Measurement - one wound []  - 0 Complex Wound Measurement - multiple wounds INTERVENTIONS - Wound Dressings []  - 0 Small Wound Dressing one or multiple wounds []  - 0 Medium Wound Dressing one or multiple wounds []  - 0 Large Wound Dressing one or multiple wounds []  - 0 Application of Medications - topical []  - 0 Application of Medications - injection INTERVENTIONS - Miscellaneous []  - 0 External ear exam []  - 0 Specimen Collection (cultures, biopsies, blood, body fluids, etc.) []  - 0 Specimen(s) / Culture(s) sent or taken to Lab for analysis []  - 0 Patient Transfer (multiple staff / Civil Service fast streamer / Similar devices) []  - 0 Simple Staple / Suture removal (25 or less) []  - 0 Complex Staple / Suture removal (26 or more) []  - 0 Hypo / Hyperglycemic Management (close monitor of Blood Glucose) []  - 0 Ankle / Brachial Index (ABI) - do not check if billed separately X- 1 5 Vital Signs Has the patient been seen at the hospital within the last three years: Yes Total Score: 90 Level Of Care: New/Established - Level 3 Electronic Signature(s) Signed: 04/23/2021 5:47:56 PM By: Deon Pilling RN, BSN Entered By: Deon Pilling on 04/23/2021 10:09:18 -------------------------------------------------------------------------------- Encounter Discharge Information Details Patient Name: Date of Service: Glenda Arena. 04/23/2021 9:30 A M Medical Record Number: [id number] Patient Account Number: [id number] Date of Birth/Sex: Treating RN: Oct 03, 1940 (81 y.o. Debby Bud Primary Care Conception Doebler: Allyn Kenner Other Clinician: Referring Shiya Fogelman: Treating  Hailie Searight/Extender: Dierdre Harness in Treatment: 4 Encounter Discharge Information Items Discharge Condition: Stable Ambulatory Status: Cane Discharge Destination: Home Transportation: Private Auto Accompanied By: daughter Schedule Follow-up Appointment: No Clinical Summary of Care: Notes applied stocking to right leg. Electronic Signature(s) Signed: 04/23/2021 5:47:56 PM By: Deon Pilling RN, BSN Entered By: Deon Pilling on 04/23/2021 10:10:15 -------------------------------------------------------------------------------- Lower Extremity Assessment Details Patient Name: Date of Service: Glenda Arena. 04/23/2021 9:30 A M Medical Record Number: [id number] Patient Account Number: [id number] Date of Birth/Sex: Treating RN: [birth year] (81 y.o. Debby Bud Primary Care Hazel Wrinkle: Allyn Kenner Other Clinician: Referring Kraven Calk: Treating Syrah Daughtrey/Extender: Dierdre Harness in Treatment: 4 Edema Assessment Assessed: [Left: No] [Right: Yes] Edema: [Left: Ye] [Right: s] Calf Left: Right: Point of Measurement: 34 cm From Medial Instep 33.5 cm Ankle Left: Right: Point of Measurement: 11 cm From Medial Instep 23.5 cm Knee To Floor Left: Right: From Medial Instep 46 cm Vascular Assessment Pulses: Dorsalis Pedis Palpable: [Right:Yes] Electronic Signature(s) Signed: 04/23/2021 5:47:56 PM By: Deon Pilling RN, BSN Entered By: Deon Pilling on 04/23/2021 09:34:11 -------------------------------------------------------------------------------- Multi Wound Chart Details Patient Name: Date of Service: Glenda Arena. 04/23/2021 9:30 A M Medical Record Number: [id number] Patient Account Number: [id number] Date of Birth/Sex: Treating RN: [birth year] (81 y.o. F) Primary Care Jazmine Heckman: Allyn Kenner Other Clinician: Referring Timoth Schara: Treating Gailya Tauer/Extender: Dierdre Harness in Treatment: 4 Vital Signs Height(in):  60 Pulse(bpm): 57 Weight(lbs): 180 Blood Pressure(mmHg): 157/79 Body Mass Index(BMI): 35.2 Temperature(F): 98.1 Respiratory Rate(breaths/min): 20 Photos: [N/A:N/A] Right, Lateral Lower Leg N/A N/A Wound Location: Trauma N/A N/A Wounding Event: Venous Leg Ulcer N/A N/A Primary Etiology: Anemia, Chronic Obstructive N/A N/A Comorbid History: Pulmonary Disease (COPD), Hypertension, Osteoarthritis, Confinement Anxiety 02/14/2021 N/A N/A Date Acquired: 4 N/A N/A Weeks  of Treatment: Open N/A N/A Wound Status: No N/A N/A Wound Recurrence: 0x0x0 N/A N/A Measurements L x W x D (cm) 0 N/A N/A A (cm) : rea 0 N/A N/A Volume (cm) : 100.00% N/A N/A % Reduction in Area: 100.00% N/A N/A % Reduction in Volume: Full Thickness Without Exposed N/A N/A Classification: Support Structures None Present N/A N/A Exudate Amount: Distinct, outline attached N/A N/A Wound Margin: None Present (0%) N/A N/A Granulation Amount: None Present (0%) N/A N/A Necrotic Amount: Fascia: No N/A N/A Exposed Structures: Fat Layer (Subcutaneous Tissue): No Tendon: No Muscle: No Joint: No Bone: No Large (67-100%) N/A N/A Epithelialization: Treatment Notes Electronic Signature(s) Signed: 04/23/2021 10:37:51 AM By: Kalman Shan DO Entered By: Kalman Shan on 04/23/2021 10:30:18 -------------------------------------------------------------------------------- Multi-Disciplinary Care Plan Details Patient Name: Date of Service: Glenda Arena. 04/23/2021 9:30 A M Medical Record Number: [id number] Patient Account Number: [id number] Date of Birth/Sex: Treating RN: 07-09-1940 (81 y.o. Debby Bud Primary Care Stephanne Greeley: Allyn Kenner Other Clinician: Referring Arelys Glassco: Treating Bond Grieshop/Extender: Dierdre Harness in Treatment: 4 Multidisciplinary Care Plan reviewed with physician Active Inactive Electronic Signature(s) Signed: 04/23/2021 5:47:56 PM By: Deon Pilling RN, BSN Entered By: Deon Pilling on 04/23/2021 09:44:10 -------------------------------------------------------------------------------- Pain Assessment Details Patient Name: Date of Service: Glenda Arena. 04/23/2021 9:30 A M Medical Record Number: [id number] Patient Account Number: [id number] Date of Birth/Sex: Treating RN: [birth year] (81 y.o. Debby Bud Primary Care Kyla Duffy: Allyn Kenner Other Clinician: Referring Carlin Mamone: Treating Seward Coran/Extender: Dierdre Harness in Treatment: 4 Active Problems Location of Pain Severity and Description of Pain Patient Has Paino No Site Locations Rate the pain. Rate the pain. Current Pain Level: 0 Pain Management and Medication Current Pain Management: Medication: No Cold Application: No Rest: No Massage: No Activity: No T.E.N.S.: No Heat Application: No Leg drop or elevation: No Is the Current Pain Management Adequate: Adequate How does your wound impact your activities of daily livingo Sleep: No Bathing: No Appetite: No Relationship With Others: No Bladder Continence: No Emotions: No Bowel Continence: No Work: No Toileting: No Drive: No Dressing: No Hobbies: No Engineer, maintenance) Signed: 04/23/2021 5:47:56 PM By: Deon Pilling RN, BSN Entered By: Deon Pilling on 04/23/2021 09:30:59 -------------------------------------------------------------------------------- Patient/Caregiver Education Details Patient Name: Date of Service: Glenda Arena 2/9/2023andnbsp9:30 A M Medical Record Number: [id number] Patient Account Number: [id number] Date of Birth/Gender: Treating RN: [birth year] (81 y.o. Debby Bud Primary Care Physician: Allyn Kenner Other Clinician: Referring Physician: Treating Physician/Extender: Dierdre Harness in Treatment: 4 Education Assessment Education Provided To: Patient Education Topics Provided Wound/Skin Impairment: Handouts:  Caring for Your Ulcer Methods: Explain/Verbal Responses: Reinforcements needed Electronic Signature(s) Signed: 04/23/2021 5:47:56 PM By: Deon Pilling RN, BSN Entered By: Deon Pilling on 04/23/2021 09:44:24 -------------------------------------------------------------------------------- Wound Assessment Details Patient Name: Date of Service: Glenda Arena. 04/23/2021 9:30 A M Medical Record Number: [id number] Patient Account Number: [id number] Date of Birth/Sex: Treating RN: [birth year] (81 y.o. Helene Shoe, Tammi Klippel Primary Care Lacy Taglieri: Allyn Kenner Other Clinician: Referring Hallis Meditz: Treating Ercel Normoyle/Extender: Dierdre Harness in Treatment: 4 Wound Status Wound Number: 1 Primary Venous Leg Ulcer Etiology: Wound Location: Right, Lateral Lower Leg Wound Open Wounding Event: Trauma Status: Date Acquired: 02/14/2021 Comorbid Anemia, Chronic Obstructive Pulmonary Disease (COPD), Weeks Of Treatment: 4 History: Hypertension, Osteoarthritis, Confinement Anxiety Clustered Wound: No Photos Wound Measurements Length: (cm) Width: (cm) Depth: (cm) Area: (cm) Volume: (cm) 0 % Reduction in Area: 100% 0 %  Reduction in Volume: 100% 0 Epithelialization: Large (67-100%) 0 Tunneling: No 0 Undermining: No Wound Description Classification: Full Thickness Without Exposed Support Structures Wound Margin: Distinct, outline attached Exudate Amount: None Present Foul Odor After Cleansing: No Slough/Fibrino No Wound Bed Granulation Amount: None Present (0%) Exposed Structure Necrotic Amount: None Present (0%) Fascia Exposed: No Fat Layer (Subcutaneous Tissue) Exposed: No Tendon Exposed: No Muscle Exposed: No Joint Exposed: No Bone Exposed: No Electronic Signature(s) Signed: 04/23/2021 5:47:56 PM By: Deon Pilling RN, BSN Entered By: Deon Pilling on 04/23/2021 09:37:21 -------------------------------------------------------------------------------- Vitals  Details Patient Name: Date of Service: Glenda Arena. 04/23/2021 9:30 A M Medical Record Number: [id number] Patient Account Number: [id number] Date of Birth/Sex: Treating RN: Apr 13, 1940 (81 y.o. Helene Shoe, Tammi Klippel Primary Care Latorsha Curling: Allyn Kenner Other Clinician: Referring Pearla Mckinny: Treating Teddrick Mallari/Extender: Dierdre Harness in Treatment: 4 Vital Signs Time Taken: 09:30 Temperature (F): 98.1 Height (in): 60 Pulse (bpm): 92 Weight (lbs): 180 Respiratory Rate (breaths/min): 20 Body Mass Index (BMI): 35.2 Blood Pressure (mmHg): 157/79 Reference Range: 80 - 120 mg / dl Electronic Signature(s) Signed: 04/23/2021 5:47:56 PM By: Deon Pilling RN, BSN Entered By: Deon Pilling on 04/23/2021 09:30:49

## 2021-04-23 NOTE — Progress Notes (Signed)
GEORGA, STYS ([id number]) Visit Report for 04/23/2021 Chief Complaint Document Details Patient Name: Date of Service: Glenda Terry, Glenda Terry 04/23/2021 9:30 A M Medical Record Number: [id number] Patient Account Number: [id number] Date of Birth/Sex: Treating RN: [birth year] (81 y.o. F) Primary Care Provider: Allyn Kenner Other Clinician: Referring Provider: Treating Provider/Extender: Dierdre Harness in Treatment: 4 Information Obtained from: Patient Chief Complaint 03/26/2021; Right lower extremity wound Following trauma Electronic Signature(s) Signed: 04/23/2021 10:37:51 AM By: Kalman Shan DO Entered By: Kalman Shan on 04/23/2021 10:30:35 -------------------------------------------------------------------------------- HPI Details Patient Name: Date of Service: Glenda Arena. 04/23/2021 9:30 A M Medical Record Number: [id number] Patient Account Number: [id number] Date of Birth/Sex: Treating RN: [birth year] (81 y.o. 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 She states that she hit her leg against a table And developed a wound. She has been following 2 weeks with a wound care center in [place] for this issue. For the past 2 weeks she has had her legs wrapped with 3 layer compression and interchanging between Medihoney and silver alginate. She reports minimal improvement in wound healing. She currently denies signs of  infection. 1/19; patient presents for follow-up. She is tolerated the compression wrap well. She has no issues or complaints today. She denies signs of infection. 1/26; patient presents for follow-up. She has tolerated the compression wrap well. She is pleased with her wound healing. She denies signs of infection. 2/2; patient presents for follow-up. She has tolerated the compression wrap well and has no issues or complaints today. 2/9; patient presents for follow-up. She has tolerated the compression wrap well. She is in good spirits today. Electronic Signature(s) Signed: 04/23/2021 10:37:51 AM By: Kalman Shan DO Entered By: Kalman Shan on 04/23/2021 10:32:12 -------------------------------------------------------------------------------- Physical Exam Details Patient Name: Date of Service: Glenda Arena. 04/23/2021 9:30 A M Medical Record Number: [id number] Patient Account Number: [id number] Date of Birth/Sex: Treating RN: Oct 11, 1940 (81 y.o. F) Primary Care Provider: Allyn Kenner Other Clinician: Referring Provider: Treating Provider/Extender: Dierdre Harness in Treatment: 4 Constitutional respirations regular, non-labored and within target range for patient.. Cardiovascular 2+ dorsalis pedis/posterior tibialis pulses. Psychiatric pleasant and cooperative. Notes Right lower extremity: T the lateral proximal aspect there is epithelization to the previous wound site. Surrounding skin is intact. Excellent edema control. o Electronic Signature(s) Signed: 04/23/2021 10:37:51 AM By: Kalman Shan DO Entered By: Kalman Shan on 04/23/2021 10:33:04 -------------------------------------------------------------------------------- Physician Orders Details Patient Name: Date of Service: Glenda Arena. 04/23/2021 9:30 A M Medical Record Number: [id number] Patient Account Number: [id number] Date of Birth/Sex: Treating RN: 1940-04-18 (81 y.o. Glenda Terry Primary Care Provider: Allyn Kenner Other Clinician: Referring Provider: Treating Provider/Extender: Dierdre Harness in Treatment: 4 Verbal / Phone Orders: No Diagnosis Coding ICD-10 Coding Code Description [phone] Non-pressure chronic ulcer of other part of right lower leg with fat layer exposed I87.2 Venous insufficiency (chronic) (peripheral) T79.8XXA Other early complications of trauma, initial encounter I10 Essential (primary) hypertension J44.9 Chronic obstructive pulmonary disease, unspecified M06.9 Rheumatoid arthritis, unspecified Discharge  From [facility] Services Discharge from [place] - Call if any future wound care needs. Edema Control - Lymphedema / SCD / Other Elevate legs to the level of the heart or above for 30 minutes daily and/or when sitting, a frequency of: - 3-4 times a throughout the day. Avoid standing for long periods of time. Patient to wear own compression stockings every day. Exercise regularly Moisturize legs daily. - both legs every night before bed. Compression stocking or Garment 20-30 mm/Hg pressure to: - apply in the morning and remove at night. Electronic Signature(s) Signed: 04/23/2021 10:37:51 AM By: Kalman Shan DO Entered By: Kalman Shan on 04/23/2021 10:33:31 -------------------------------------------------------------------------------- Problem List Details Patient Name: Date of Service: Glenda Arena. 04/23/2021 9:30 A M Medical Record Number: [id number] Patient Account Number: [id number] Date of Birth/Sex: Treating RN: February 02, 1941 (81 y.o. Glenda Terry Primary Care Provider: Allyn Kenner Other Clinician: Referring Provider: Treating Provider/Extender: Dierdre Harness in Treatment: 4 Active Problems ICD-10 Encounter Code Description Active Date MDM Diagnosis [phone] Non-pressure chronic ulcer of other part of right lower leg with fat layer 03/26/2021 No Yes exposed I87.2  Venous insufficiency (chronic) (peripheral) 03/26/2021 No Yes T79.8XXA Other early complications of trauma, initial encounter 03/26/2021 No Yes I10 Essential (primary) hypertension 03/26/2021 No Yes J44.9 Chronic obstructive pulmonary disease, unspecified 03/26/2021 No Yes M06.9 Rheumatoid arthritis, unspecified 03/26/2021 No Yes Inactive Problems Resolved Problems Electronic Signature(s) Signed: 04/23/2021 10:37:51 AM By: Kalman Shan DO Entered By: Kalman Shan on 04/23/2021 10:28:07 -------------------------------------------------------------------------------- Progress Note Details Patient Name: Date of Service: Glenda Arena. 04/23/2021 9:30 A M Medical Record Number: [id number] Patient Account Number: [id number] Date of Birth/Sex: Treating RN: 1940/08/13 (81 y.o. F) Primary Care Provider: Allyn Kenner Other Clinician: Referring Provider: Treating Provider/Extender: Dierdre Harness in Treatment: 4 Subjective Chief Complaint Information obtained from Patient 03/26/2021; Right lower extremity wound Following trauma History of Present Illness (HPI) Admission 03/26/2021 Ms. 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 She states that she hit her leg against a table And developed a wound. She has been following 2 weeks with a wound care center in [place] for this issue. For the past 2 weeks she has had her legs wrapped with 3 layer compression and interchanging between Medihoney and silver alginate. She reports minimal improvement in wound healing. She currently denies signs of infection. 1/19; patient presents for follow-up. She is tolerated  the compression wrap well. She has no issues or complaints today. She denies signs of infection. 1/26; patient presents for follow-up. She has tolerated the compression wrap well. She is pleased with her wound healing. She denies signs of infection. 2/2; patient presents for follow-up. She has tolerated the compression wrap well and has no issues or complaints today. 2/9; patient presents for follow-up. She has tolerated the compression wrap well. She is in good spirits today. Patient History Information obtained from Patient. Family History Unknown History. Social History Former smoker - quit 30 years ago, Alcohol Use - Never, Drug Use - No History, Caffeine Use - Rarely. Medical History Hematologic/Lymphatic Patient has history of Anemia Respiratory Patient has history of Chronic Obstructive Pulmonary Disease (COPD) Cardiovascular Patient has history of Hypertension Musculoskeletal Patient has history of Osteoarthritis  Psychiatric Patient has history of Confinement Anxiety Medical A Surgical History Notes nd Gastrointestinal GERD Objective Constitutional respirations regular, non-labored and within target range for patient.. Vitals Time Taken: 9:30 AM, Height: 60 in, Weight: 180 lbs, BMI: 35.2, Temperature: 98.1 F, Pulse: 92 bpm, Respiratory Rate: 20 breaths/min, Blood Pressure: 157/79 mmHg. Cardiovascular 2+ dorsalis pedis/posterior tibialis pulses. Psychiatric pleasant and cooperative. General Notes: Right lower extremity: T the lateral proximal aspect there is epithelization to the previous wound site. Surrounding skin is intact. Excellent o edema control. Integumentary (Hair, Skin) Wound #1 status is Open. Original cause of wound was Trauma. The date acquired was: 02/14/2021. The wound has been in treatment 4 weeks. The wound is located on the Right,Lateral Lower Leg. The wound measures 0cm length x 0cm width x 0cm depth; 0cm^2 area and 0cm^3 volume. There is no  tunneling or undermining noted. There is a none present amount of drainage noted. The wound margin is distinct with the outline attached to the wound base. There is no granulation within the wound bed. There is no necrotic tissue within the wound bed. Assessment Active Problems ICD-10 Non-pressure chronic ulcer of other part of right lower leg with fat layer exposed Venous insufficiency (chronic) (peripheral) Other early complications of trauma, initial encounter Essential (primary) hypertension Chronic obstructive pulmonary disease, unspecified Rheumatoid arthritis, unspecified Patient had done well with Hydrofera Blue and Kerlix/Coban. Her wound is healed. She has her compression stocking today. I recommended using these daily. She can follow-up as needed. Plan Discharge From [facility] Services: Discharge from [place] - Call if any future wound care needs. Edema Control - Lymphedema / SCD / Other: Elevate legs to the level of the heart or above for 30 minutes daily and/or when sitting, a frequency of: - 3-4 times a throughout the day. Avoid standing for long periods of time. Patient to wear own compression stockings every day. Exercise regularly Moisturize legs daily. - both legs every night before bed. Compression stocking or Garment 20-30 mm/Hg pressure to: - apply in the morning and remove at night. 1. Compression stockings daily 2. Vaseline nightly 3. Follow-up as needed 4. Discharge from clinic due to closed wound Electronic Signature(s) Signed: 04/23/2021 10:37:51 AM By: Kalman Shan DO Entered By: Kalman Shan on 04/23/2021 10:36:06 -------------------------------------------------------------------------------- HxROS Details Patient Name: Date of Service: Glenda Arena. 04/23/2021 9:30 A M Medical Record Number: [id number] Patient Account Number: [id number] Date of Birth/Sex: Treating RN: 31-May-1940 (81 y.o. F) Primary Care Provider: Allyn Kenner Other  Clinician: Referring Provider: Treating Provider/Extender: Dierdre Harness in Treatment: 4 Information Obtained From Patient Hematologic/Lymphatic Medical History: Positive for: Anemia Respiratory Medical History: Positive for: Chronic Obstructive Pulmonary Disease (COPD) Cardiovascular Medical History: Positive for: Hypertension Gastrointestinal Medical History: Past Medical History Notes: GERD Musculoskeletal Medical History: Positive for: Osteoarthritis Psychiatric Medical History: Positive for: Confinement Anxiety Immunizations Pneumococcal Vaccine: Received Pneumococcal Vaccination: Yes Received Pneumococcal Vaccination On or After 60th Birthday: Yes Implantable Devices None Family and Social History Unknown History: Yes; Former smoker - quit 30 years ago; Alcohol Use: Never; Drug Use: No History; Caffeine Use: Rarely; Financial Concerns: No; Food, Clothing or Shelter Needs: No; Support System Lacking: No; Transportation Concerns: No Electronic Signature(s) Signed: 04/23/2021 10:37:51 AM By: Kalman Shan DO Entered By: Kalman Shan on 04/23/2021 10:32:22 -------------------------------------------------------------------------------- SuperBill Details Patient Name: Date of Service: Glenda Arena 04/23/2021 Medical Record Number: [id number] Patient Account Number: [id number] Date of Birth/Sex: Treating RN: May 03, 1940 (81 y.o. F) [place], [place]  Primary Care Provider: Allyn Kenner Other Clinician: Referring Provider: Treating Provider/Extender: Dierdre Harness in Treatment: 4 Diagnosis Coding ICD-10 Codes Code Description [phone] Non-pressure chronic ulcer of other part of right lower leg with fat layer exposed I87.2 Venous insufficiency (chronic) (peripheral) T79.8XXA Other early complications of trauma, initial encounter I10 Essential (primary) hypertension J44.9 Chronic obstructive pulmonary disease,  unspecified M06.9 Rheumatoid arthritis, unspecified Facility Procedures CPT4 Code: [id number] Description: 99213 - WOUND CARE VISIT-LEV 3 EST PT Modifier: Quantity: 1 Physician Procedures : CPT4 Code Description Modifier [id number] - WC PHYS LEVEL 3 - EST PT ICD-10 Diagnosis Description L97.812 Non-pressure chronic ulcer of other part of right lower leg with fat layer exposed I87.2 Venous insufficiency (chronic) (peripheral) T79.8XXA  Other early complications of trauma, initial encounter M06.9 Rheumatoid arthritis, unspecified Quantity: 1 Electronic Signature(s) Signed: 04/23/2021 10:37:51 AM By: Kalman Shan DO Entered By: Kalman Shan on 04/23/2021 10:36:37

## 2021-04-24 ENCOUNTER — Ambulatory Visit: Payer: PPO | Admitting: Adult Health

## 2021-04-24 ENCOUNTER — Other Ambulatory Visit: Payer: Self-pay

## 2021-04-24 ENCOUNTER — Other Ambulatory Visit (HOSPITAL_COMMUNITY)
Admission: RE | Admit: 2021-04-24 | Discharge: 2021-04-24 | Disposition: A | Discharge status: Home / Self Care | Payer: PPO | Source: Ambulatory Visit | Attending: General Practice | Admitting: General Practice

## 2021-04-24 DIAGNOSIS — Z79899 Other long term (current) drug therapy: Secondary | ICD-10-CM

## 2021-04-24 DIAGNOSIS — I10 Essential (primary) hypertension: Secondary | ICD-10-CM | POA: Insufficient documentation

## 2021-04-24 DIAGNOSIS — I5032 Chronic diastolic (congestive) heart failure: Secondary | ICD-10-CM | POA: Diagnosis not present

## 2021-04-24 DIAGNOSIS — J4550 Severe persistent asthma, uncomplicated: Secondary | ICD-10-CM | POA: Diagnosis not present

## 2021-04-24 DIAGNOSIS — D7219 Other eosinophilia: Secondary | ICD-10-CM | POA: Diagnosis not present

## 2021-04-24 LAB — BASIC METABOLIC PANEL
Anion gap: 8 (ref 5–15)
BUN: 20 mg/dL (ref 8–23)
CO2: 33 mmol/L — ABNORMAL HIGH (ref 22–32)
Calcium: 8.8 mg/dL — ABNORMAL LOW (ref 8.9–10.3)
Chloride: 96 mmol/L — ABNORMAL LOW (ref 98–111)
Creatinine, Ser: 1.43 mg/dL — ABNORMAL HIGH (ref 0.44–1.00)
GFR, Estimated: 38 mL/min — ABNORMAL LOW (ref >60)
Glucose, Bld: 88 mg/dL (ref 70–99)
Potassium: 3.2 mmol/L — ABNORMAL LOW (ref 3.5–5.1)
Sodium: 137 mmol/L (ref 135–145)

## 2021-04-24 MED ORDER — POTASSIUM CHLORIDE ER 10 MEQ PO TBCR: EXTENDED_RELEASE_TABLET | ORAL | 3 refills | Status: DC

## 2021-04-24 NOTE — Addendum Note (Signed)
Addended by: Waylan Rocher on: 04/24/2021 04:35 PM   Modules accepted: Orders

## 2021-04-28 ENCOUNTER — Other Ambulatory Visit (HOSPITAL_COMMUNITY)
Admission: RE | Admit: 2021-04-28 | Discharge: 2021-04-28 | Disposition: A | Discharge status: Home / Self Care | Payer: PPO | Source: Ambulatory Visit | Attending: General Practice | Admitting: General Practice

## 2021-04-28 ENCOUNTER — Encounter (HOSPITAL_BASED_OUTPATIENT_CLINIC_OR_DEPARTMENT_OTHER): Payer: Self-pay

## 2021-04-28 DIAGNOSIS — I5032 Chronic diastolic (congestive) heart failure: Secondary | ICD-10-CM | POA: Diagnosis not present

## 2021-04-28 DIAGNOSIS — Z79899 Other long term (current) drug therapy: Secondary | ICD-10-CM | POA: Diagnosis not present

## 2021-04-28 LAB — BASIC METABOLIC PANEL
Anion gap: 12 (ref 5–15)
BUN: 15 mg/dL (ref 8–23)
CO2: 24 mmol/L (ref 22–32)
Calcium: 8.1 mg/dL — ABNORMAL LOW (ref 8.9–10.3)
Chloride: 104 mmol/L (ref 98–111)
Creatinine, Ser: 1.31 mg/dL — ABNORMAL HIGH (ref 0.44–1.00)
GFR, Estimated: 42 mL/min — ABNORMAL LOW (ref >60)
Glucose, Bld: 108 mg/dL — ABNORMAL HIGH (ref 70–99)
Potassium: 4.1 mmol/L (ref 3.5–5.1)
Sodium: 140 mmol/L (ref 135–145)

## 2021-05-06 DIAGNOSIS — Z6837 Body mass index (BMI) 37.0-37.9, adult: Secondary | ICD-10-CM | POA: Diagnosis not present

## 2021-05-06 DIAGNOSIS — Z7951 Long term (current) use of inhaled steroids: Secondary | ICD-10-CM | POA: Diagnosis not present

## 2021-05-06 DIAGNOSIS — S81801D Unspecified open wound, right lower leg, subsequent encounter: Secondary | ICD-10-CM | POA: Diagnosis not present

## 2021-05-06 DIAGNOSIS — I110 Hypertensive heart disease with heart failure: Secondary | ICD-10-CM | POA: Diagnosis not present

## 2021-05-06 DIAGNOSIS — J449 Chronic obstructive pulmonary disease, unspecified: Secondary | ICD-10-CM | POA: Diagnosis not present

## 2021-05-06 DIAGNOSIS — Z515 Encounter for palliative care: Secondary | ICD-10-CM | POA: Diagnosis not present

## 2021-05-06 DIAGNOSIS — I509 Heart failure, unspecified: Secondary | ICD-10-CM | POA: Diagnosis not present

## 2021-05-07 DIAGNOSIS — M7989 Other specified soft tissue disorders: Secondary | ICD-10-CM | POA: Insufficient documentation

## 2021-05-07 DIAGNOSIS — G894 Chronic pain syndrome: Secondary | ICD-10-CM | POA: Diagnosis not present

## 2021-05-07 DIAGNOSIS — M5136 Other intervertebral disc degeneration, lumbar region: Secondary | ICD-10-CM | POA: Diagnosis not present

## 2021-05-07 NOTE — Progress Notes (Signed)
Cardiology Office Note   Date:  05/08/2021   ID:  Bryson, Palen [birth year], MRN [id number]  PCP:  Celene Squibb, MD  Cardiologist:   Elouise Munroe, MD   Chief Complaint  Patient presents with   Shortness of Breath      History of Present Illness: Glenda Terry is a 81 y.o. female who presents for evaluation of lower extremity swelling.  She called yesterday and was added to my schedule.  She has a history of hypertension and CKD 3.  She has had heart failure with a preserved ejection fraction.  She was in the hospital in October with an asthma exacerbation.  She was treated with steroids and nebulizers.  She was treated with antibiotics.  When she returned and was last seen in follow-up by one of our nurse practitioners she was doing relatively well and seemed to have good volume control.     Since I last saw her she has done better.  Her lower extremity swelling is improved.  Her weight is down a little bit.  A chronic wound that was being addressed is healed.  She is watching her salt and fluid.  She is taking the diuretic at a slightly higher dose.  She had follow-up kidney function.  The patient denies any new symptoms such as chest discomfort, neck or arm discomfort. There has been no new shortness of breath, PND or orthopnea. There have been no reported palpitations, presyncope or syncope.    Past Medical History:  Diagnosis Date   Anxiety    Asthma    Back pain, chronic    CKD (chronic kidney disease) stage 3, GFR 30-59 ml/min (HCC)    Compression fx, thoracic spine (HCC)    T - 11   COPD (chronic obstructive pulmonary disease) ([place]) 12/16/2015   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HCC)    Essential hypertension    GERD (gastroesophageal reflux disease)    HOH (hard of hearing)    Inflammatory arthritis    Iron deficiency anemia 01/2012   Pulmonary eosinophilia (HCC)    Spinal stenosis    Tubular adenoma 11/2012    Past Surgical History:  Procedure Laterality  Date   ABDOMINAL HYSTERECTOMY     BACK SURGERY     CARPAL TUNNEL RELEASE Right 06/2012   CATARACT EXTRACTION W/PHACO Left 11/19/2014   Procedure: CATARACT EXTRACTION PHACO AND INTRAOCULAR LENS PLACEMENT ([place]);  Surgeon: Williams Che, MD;  Location: [facility];  Service: Ophthalmology;  Laterality: Left;  CDE: 4.77   CATARACT EXTRACTION W/PHACO Right 02/03/2015   Procedure: CATARACT EXTRACTION PHACO AND INTRAOCULAR LENS PLACEMENT ([facility]);  Surgeon: Williams Che, MD;  Location: [facility];  Service: Ophthalmology;  Laterality: Right;  CDE: 4.54   COLONOSCOPY  06/19/2008   RMR: tortuous and elongated colon with scattered left-sided diverticula/colonic mucosa appeared entirely normal. Prior colonic ulcers had resolved.   COLONOSCOPY  12/2007   Dr. Gala Romney: Scattered diffuse sigmoid diverticula, 2 areas of ulceration at the hepatic flexure. Biopsies unremarkable.   COLONOSCOPY N/A 11/20/2012   next TCS 11/2017   COLONOSCOPY WITH PROPOFOL N/A 12/22/2017   Procedure: COLONOSCOPY WITH PROPOFOL;  Surgeon: Daneil Dolin, MD; diverticula in the sigmoid and descending colon and 2 tubular adenomas.  No recommendations to repeat.     DECOMPRESSIVE LUMBAR LAMINECTOMY LEVEL 2  04/20/2012   Procedure: DECOMPRESSIVE LUMBAR LAMINECTOMY LEVEL 2;  Surgeon: Tobi Bastos, MD;  Location: [facility];  Service: Orthopedics;  Laterality: Right;  Decompressive Lumbar Laminectomy of the L4 - L5 and L5 - S1 Complete/Laminectomy L5 on the Right (X-Ray)   ESOPHAGOGASTRODUODENOSCOPY  12/2007   Dr. 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 Small hiatal hernia. Slightly pale duodenal mucosa (biopsy unremarkable)   ESOPHAGOGASTRODUODENOSCOPY (EGD) WITH PROPOFOL N/A 01/20/2017   Dr. Gala Romney: Medium sized hiatal hernia empiric esophageal dilation for history of dysphagia   ESOPHAGOGASTRODUODENOSCOPY (EGD) WITH PROPOFOL N/A 04/08/2020   Surgeon: Eloise Harman, DO;  medium size hiatal  hernia 3 to 4 cm chicken bone lodged in the gastric antrum just proximal to the pylorus removed with a snare with mild bleeding at the site of lodged bone.  Normal examined duodenum.   EYE SURGERY     BIL CATARACTS   FOREIGN BODY REMOVAL N/A 04/08/2020   Procedure: FOREIGN BODY REMOVAL;  Surgeon: Eloise Harman, DO;  [facility];  Service: Endoscopy;  Laterality: N/A;   INCISIONAL HERNIA REPAIR N/A 11/12/2020   Procedure: HERNIA REPAIR INCISIONAL;  Surgeon: Aviva Signs, MD;  Location: [facility];  Service: General;  Laterality: N/A;   IR KYPHO THORACIC WITH BONE BIOPSY  02/08/2017   IR RADIOLOGIST EVAL & MGMT  02/02/2017   JOINT REPLACEMENT     KNEE ARTHROSCOPY Right    MALONEY DILATION N/A 01/20/2017   Procedure: MALONEY DILATION;  Surgeon: Daneil Dolin, MD;  [facility];  Service: Endoscopy;  Laterality: N/A;   POLYPECTOMY  12/22/2017   Procedure: POLYPECTOMY;  Surgeon: Daneil Dolin, MD;  [facility];  Service: Endoscopy;;   REVERSE SHOULDER ARTHROPLASTY Right 05/24/2014   Procedure: RIGHT SHOULDER REVERSE ARTHROPLASTY;  Surgeon: Netta Cedars, MD;  Location: [place];  Service: Orthopedics;  Laterality: Right;   REVERSE SHOULDER ARTHROPLASTY Left 06/10/2017   Procedure: LEFT REVERSE SHOULDER ARTHROPLASTY;  Surgeon: Netta Cedars, MD;  Location: [place];  Service: Orthopedics;  Laterality: Left;   TOTAL KNEE ARTHROPLASTY  01/24/2012   Procedure: TOTAL KNEE ARTHROPLASTY;  Surgeon: Gearlean Alf, MD;  [facility];  Service: Orthopedics;  Laterality: Right;   TOTAL KNEE ARTHROPLASTY Left 10/15/2019   Procedure: TOTAL KNEE ARTHROPLASTY;  Surgeon: Gaynelle Arabian, MD;  [facility];  Service: Orthopedics;  Laterality: Left;  32min   TUBAL LIGATION       Current Outpatient Medications  Medication Sig Dispense Refill   albuterol (PROAIR HFA) 108 (90 Base) MCG/ACT inhaler Inhale 2 puffs into the lungs every 6 (six) hours as needed for wheezing or  shortness of breath (For COPD).      ALPRAZolam (XANAX) 0.5 MG tablet Take 0.5 tablets (0.25 mg total) by mouth 2 (two) times daily as needed for anxiety. 10 tablet 0   amLODipine (NORVASC) 5 MG tablet Take 1 tablet (5 mg total) by mouth daily. 90 tablet 3   Benralizumab 30 MG/ML SOSY Inject 30 mg into the skin. Once A Day Every 56 Days     calcium carbonate (OS-CAL) 600 MG TABS tablet Take 600 mg by mouth 2 (two) times daily with a meal.     Cholecalciferol (VITAMIN D3) 50 MCG (2000 UT) CAPS Take 1 capsule by mouth daily.     denosumab (PROLIA) 60 MG/ML SOSY injection Inject 60 mg into the skin every 6 (six) months.     diphenhydrAMINE (BENADRYL) 25 mg capsule Take 25 mg by mouth at bedtime.     DULERA 200-5 MCG/ACT AERO Inhale 2 puffs into the lungs 2 (two) times daily.  8.8 g 0   esomeprazole (NEXIUM) 40 MG capsule Take 1 capsule (40 mg total) by mouth 2 (two) times daily. 60 capsule 0   ferrous sulfate 325 (65 FE) MG tablet Take 1 tablet (325 mg total) by mouth daily with breakfast. 30 tablet 0   fexofenadine (ALLEGRA) 180 MG tablet Take 180 mg by mouth daily.     fluticasone (FLONASE) 50 MCG/ACT nasal spray Place 1 spray into both nostrils daily as needed for allergies or rhinitis.      ipratropium-albuterol (DUONEB) 0.5-2.5 (3) MG/3ML SOLN Take 3 mLs by nebulization every 4 (four) hours as needed.     ketotifen (ZADITOR) 0.025 % ophthalmic solution Place 1 drop into both eyes 2 (two) times daily as needed (allergies). 5 mL 0   montelukast (SINGULAIR) 10 MG tablet Take 1 tablet (10 mg total) by mouth at bedtime. 90 tablet 2   Oxycodone HCl 10 MG TABS Take 10 mg by mouth every 6 (six) hours.     Polyethyl Glycol-Propyl Glycol (SYSTANE OP) Place 1 drop into both eyes 3 (three) times daily as needed (dry/irritated eyes.).      polyethylene glycol (MIRALAX / GLYCOLAX) 17 g packet Take 17 g by mouth daily. 14 each 0   potassium chloride (KLOR-CON) 10 MEQ tablet Take 40 meq(4 tab) on Monday,  Wednesday and Friday, all other days take 20 meq(2 tab) by mouth daily, Take additional 50meq(2tab) today, and 71meq(6tab) x3days then back to regular dosing 360 tablet 3   pravastatin (PRAVACHOL) 10 MG tablet Take 10 mg by mouth daily.     torsemide (DEMADEX) 20 MG tablet Take 40 mg(2 tablets) by mouth Monday, Wednesday and Friday, all other days take 20 mg(1 tablet) by mouth daily 270 tablet 3   vitamin C (ASCORBIC ACID) 500 MG tablet Take 500 mg by mouth daily.     Acetaminophen (APAP 500 PO) Take 2 tablets by mouth 2 (two) times daily as needed. (Patient not taking: Reported on 05/08/2021)     carvedilol (COREG) 6.25 MG tablet Take 1 tablet (6.25 mg total) by mouth 2 (two) times daily. 180 tablet 3   No current facility-administered medications for this visit.    Allergies:   Flexeril [cyclobenzaprine], Other, Keflex [cephalexin], Aspirin, Fentanyl, Hydrocodone, Adhesive [tape], and Chlorhexidine    ROS:  Please see the history of present illness.   Otherwise, review of systems are positive for none.   All other systems are reviewed and negative.    PHYSICAL EXAM: VS:  BP (!) 156/92    Pulse 99    Ht 5' (1.524 m)    Wt 184 lb 3.2 oz (83.6 kg)    SpO2 98%    BMI 35.97 kg/m  , BMI Body mass index is 35.97 kg/m. GENERAL:  Well appearing NECK:  No jugular venous distention, waveform within normal limits, carotid upstroke brisk and symmetric, no bruits, no thyromegaly LUNGS:  Clear to auscultation bilaterally CHEST:  Unremarkable HEART:  PMI not displaced or sustained,S1 and S2 within normal limits, no S3, no S4, no clicks, no rubs, no murmurs ABD:  Flat, positive bowel sounds normal in frequency in pitch, no bruits, no rebound, no guarding, no midline pulsatile mass, no hepatomegaly, no splenomegaly EXT:  2 plus pulses throughout, no edema, no cyanosis no clubbing   EKG:  EKG is not ordered today. NA   Recent Labs: 01/06/2021: B Natriuretic Peptide 50.0 01/07/2021: ALT 21;  Hemoglobin 10.9; Platelets 243 01/09/2021: Magnesium 2.4 04/28/2021: BUN 15; Creatinine,  Ser 1.31; Potassium 4.1; Sodium 140    Lipid Panel No results found for: CHOL, TRIG, HDL, CHOLHDL, VLDL, LDLCALC, LDLDIRECT    Wt Readings from Last 3 Encounters:  05/08/21 184 lb 3.2 oz (83.6 kg)  04/17/21 191 lb (86.6 kg)  03/27/21 183 lb (83 kg)      Other studies Reviewed: Additional studies/ records that were reviewed today include: ** Review of the above records demonstrates:  Please see elsewhere in the note.     ASSESSMENT AND PLAN:  Essential hypertension:   I did review a blood pressure diary and is still slightly elevated.  I am going to increase her carvedilol to 6.25 mg twice daily.  Otherwise no change in therapy.   Acute on Chronic diastolic CHF:   We had a long discussion about this.  She has been educated extensively about salt and fluid.  She has not been good about this.  She had follow-up blood work and is doing well on the higher dose of diuretic.  No change in therapy.    Current medicines are reviewed at length with the patient today.  The patient does not have concerns regarding medicines.  The following changes have been made:   As above  Labs/ tests ordered today include:   No orders of the defined types were placed in this encounter.    Disposition:   FU with me or Coletta Memos NP in one month.     Signed, Minus Breeding, MD  05/08/2021 3:12 PM    [place]

## 2021-05-08 ENCOUNTER — Other Ambulatory Visit: Payer: Self-pay

## 2021-05-08 ENCOUNTER — Ambulatory Visit: Payer: PPO | Admitting: Cardiology

## 2021-05-08 ENCOUNTER — Encounter: Payer: Self-pay | Admitting: Cardiology

## 2021-05-08 VITALS — BP 156/92 | HR 99 | Ht 60.0 in | Wt 184.2 lb

## 2021-05-08 DIAGNOSIS — M7989 Other specified soft tissue disorders: Secondary | ICD-10-CM

## 2021-05-08 DIAGNOSIS — I5032 Chronic diastolic (congestive) heart failure: Secondary | ICD-10-CM | POA: Diagnosis not present

## 2021-05-08 DIAGNOSIS — I10 Essential (primary) hypertension: Secondary | ICD-10-CM | POA: Diagnosis not present

## 2021-05-08 MED ORDER — CARVEDILOL 6.25 MG PO TABS: 6.2500 mg | ORAL_TABLET | Freq: Two times a day (BID) | ORAL | 3 refills | Status: DC

## 2021-05-08 NOTE — Patient Instructions (Signed)
Medication Instructions:  INCREASE CARVEDILOL TO 6.25mg  TWICE DAILY  *If you need a refill on your cardiac medications before your next appointment, please call your pharmacy*  Follow-Up: At [facility], you and your health needs are our priority.  As part of our continuing mission to provide you with exceptional heart care, we have created designated Provider Care Teams.  These Care Teams include your primary Cardiologist (physician) and Advanced Practice Providers (APPs -  Physician Assistants and Nurse Practitioners) who all work together to provide you with the care you need, when you need it.  Your next appointment:   1 month(s)  The format for your next appointment:   In Person  Provider:   Coletta Memos, FNP    {

## 2021-05-12 ENCOUNTER — Telehealth: Payer: Self-pay | Admitting: Cardiology

## 2021-05-12 NOTE — Telephone Encounter (Signed)
Pt c/o medication issue:  1. Name of Medication: carvedilol (COREG) 6.25 MG tablet  2. How are you currently taking this medication (dosage and times per day)? Take 1 tablet (6.25 mg total) by mouth 2 (two) times daily.  3. Are you having a reaction (difficulty breathing--STAT)? Yes   4. What is your medication issue? Patient said that when she was put on medication, it makes her extremely lethargic and weak. Can't get any work done due to extreme tiredness

## 2021-05-12 NOTE — Telephone Encounter (Signed)
Spoke with pt, aware of the recommendations.  

## 2021-05-12 NOTE — Telephone Encounter (Signed)
Called patient, she states she seen Dr.Hochrein on 02/24 at that visit, he increased her Carvedilol to 6.25 twice daily. Since starting this she has been extremely weak, and very tired. She can not do her daily activities because of this. She would like to know if this will go away if she continues the medication longer. Advised I would route to [facility] to advise.  Thanks!

## 2021-05-12 NOTE — Telephone Encounter (Signed)
Generally it will go away by 2 weeks. She should let us know if her heart rate is <55 or if she does not see improvement in about 2 weeks.

## 2021-05-19 DIAGNOSIS — E1122 Type 2 diabetes mellitus with diabetic chronic kidney disease: Secondary | ICD-10-CM | POA: Diagnosis not present

## 2021-05-26 DIAGNOSIS — J449 Chronic obstructive pulmonary disease, unspecified: Secondary | ICD-10-CM | POA: Diagnosis not present

## 2021-05-26 DIAGNOSIS — M8580 Other specified disorders of bone density and structure, unspecified site: Secondary | ICD-10-CM | POA: Diagnosis not present

## 2021-05-26 DIAGNOSIS — F411 Generalized anxiety disorder: Secondary | ICD-10-CM | POA: Diagnosis not present

## 2021-05-26 DIAGNOSIS — D509 Iron deficiency anemia, unspecified: Secondary | ICD-10-CM | POA: Diagnosis not present

## 2021-05-26 DIAGNOSIS — M1990 Unspecified osteoarthritis, unspecified site: Secondary | ICD-10-CM | POA: Diagnosis not present

## 2021-05-26 DIAGNOSIS — E875 Hyperkalemia: Secondary | ICD-10-CM | POA: Diagnosis not present

## 2021-05-26 DIAGNOSIS — I10 Essential (primary) hypertension: Secondary | ICD-10-CM | POA: Diagnosis not present

## 2021-05-26 DIAGNOSIS — E1122 Type 2 diabetes mellitus with diabetic chronic kidney disease: Secondary | ICD-10-CM | POA: Diagnosis not present

## 2021-05-26 DIAGNOSIS — Z87311 Personal history of (healed) other pathological fracture: Secondary | ICD-10-CM | POA: Diagnosis not present

## 2021-05-26 DIAGNOSIS — E782 Mixed hyperlipidemia: Secondary | ICD-10-CM | POA: Diagnosis not present

## 2021-06-08 NOTE — Progress Notes (Signed)
? ?Cardiology Office Note:   ? ?Date:  06/10/2021  ? ?ID:  Glenda Terry, DOB [birth year], MRN [id number] ? ?PCP:  Celene Squibb, MD ?  ?[facility] Providers ?Cardiologist:  Elouise Munroe, MD    ? ?Referring MD: Celene Squibb, MD  ? ?Follow-up for hypertension and lower extremity swelling. ? ?History of Present Illness:   ? ?Glenda Terry is a 81 y.o. female with a hx of hypertension, CKD stage III, anxiety, COPD, Cushing syndrome, GERD, pulmonary eosinophilia, and HFpEF. ? ?She is previously seen and evaluated with cardiac monitor for sinus tachycardia in 2014.  Her echocardiogram in 2017 was normal.  She was seen in 2018 and had no significant pathology. ? ?She was seen in follow-up by Dr. Margaretann Loveless on 06/17/2020.  During that time she denied chest pain, chest pressure, PND, and orthopnea.  She denied fever, cough, chills, nausea, vomiting, diet Rea, dizziness, and lightheadedness.  She had been seen by nephrology and her olmesartan was held due to AKI.  She struggled with abdominal pain which she reported was from a chicken bone that become stuck in her stomach.  This caused her blood pressure to be elevated and on exam her blood pressure was elevated.  She was encouraged to take her diuretic medication daily due to her diastolic heart failure.  She was started on amlodipine.  Lower extremity compression stockings were recommended for her lower extremity swelling. ? ?Recently admitted to the hospital on 01/06/2021 and discharged on 01/09/2021 with asthma exacerbation.  She responded well to IV steroids, nebulizer treatments, and antitussive drugs.  Her COVID-19 screening was negative.  Her chest x-ray showed bronchitis and she was also given azithromycin. ? ?She presented to the clinic 01/26/2021 for follow-up evaluation stated she felt well.  She had resumed walking about 45 minutes/day.  She enjoyed using her stand-up walker.  She followed a strict low-sodium diet.  She also presented  wearing lower  extremity support stockings.  She had been monitoring her blood pressure and weight.  They were both  stable.  We reviewed diastolic heart failure and signs and symptoms.  We reviewed her recent hospital admission for asthma exacerbation.  I  ordered a BMP  continued her current diet, continue her current exercise regimen, and planned follow-up for 4 to 6 months.  We discussed the importance of contacting the office with a weight increase of 3 pounds overnight or 5 pounds in 1 week.  I  also asked her to continue to monitor her blood pressure with regards to her diastolic CHF.  Her and her daughter expressed understanding. ? ?She was seen in follow-up by Dr. Percival Spanish on 03/27/2021.  She reported that in December she had injured her leg.  She was being seen by the wound clinic.  Since her injury she was noted to have increased swelling.  It was noted to be more on the right than on the left.  However during evaluation she was noted to have bilateral lower extremity swelling.  Her weights have been stable.  She denied increase of her sodium content and fluid.  Her ambulation was decreased.  She denied new symptoms of shortness of breath PND and orthopnea.  She denied new chest pain discomfort.  She denied palpitations fevers and chills.  Her blood pressure was noted to be mildly elevated at 146/82.  She was concerned that her amlodipine may be contributing to her lower extremity swelling.  It was reduced to 5 mg daily.  She was instructed to maintain a blood pressure log.  It was felt that she may need a beta-blocker or hydralazine for further blood pressure management. ? ?She presented to the clinic 04/17/2021 for follow-up evaluation stated she noticed right foot swelling.  She continued to see wound care for her right anterior lower leg wound.  Her and her daughter reported it was healing well.  She had 1 more visit to wound care next week.  We reviewed her blood pressure and weight logs.  She had been doubling her  torsemide on days when she notices weight increase.  She was also been affected by her asthma and allergies.  We reviewed the importance of taking her Flonase regularly.  I will increased her torsemide to 40 mg Monday Wednesday Friday and on the days when she takes extra torsemide we will double her potassium.  I reviewed the importance of fluid restriction and daily weights.  I ordered a BMP in 1 week, started carvedilol 3.125 mg twice daily, and had her keep her follow-up with Dr. Percival Spanish. ? ?She followed up with Dr. Percival Spanish 05/08/21.  He felt that her left lower extremity swelling had improved.  Her weight was slightly improved.  Her lower extremity wound was healing/healed.  She continues to watch her salt and fluid intake.  She continued to take her diuretic medication.  She denied chest discomfort, arm and neck discomfort she denied new shortness of breath, orthopnea PND.  She denied palpitations presyncope and syncope.  Her carvedilol was increased to 6.25 mg twice daily. ? ?She presents to the clinic today for follow-up evaluation and states she feels well.  She brings her blood pressure log today which shows well-controlled blood pressure.  She is tolerating her medications well without side effects.  Her leg, anterior right has healed well.  She does have some moderate contusion on her right anterior shin.  She feels that she may have walked into her/bumped her right shin.  There are no open wounds.  I will continue her current medication regimen continue her current diet, and plan follow-up in 4 to 6 months. ? ?Today she denies chest pain, increased shortness of breath, increased lower extremity edema, fatigue, palpitations, melena, hematuria, hemoptysis, diaphoresis, weakness, presyncope, syncope, orthopnea, and PND. ? ?Past Medical History:  ?Diagnosis Date  ? Anxiety   ? Asthma   ? Back pain, chronic   ? CKD (chronic kidney disease) stage 3, GFR 30-59 ml/min (HCC)   ? Compression fx, thoracic spine  (HCC)   ? T - 11  ? COPD (chronic obstructive pulmonary disease) ([place]) 12/16/2015  ? 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[place])   ? Essential hypertension   ? GERD (gastroesophageal reflux disease)   ? HOH (hard of hearing)   ? Inflammatory arthritis   ? Iron deficiency anemia 01/2012  ? Pulmonary eosinophilia ([place])   ? Spinal stenosis   ? Tubular adenoma 11/2012  ? ? ?Past Surgical History:  ?Procedure Laterality Date  ? ABDOMINAL HYSTERECTOMY    ? BACK SURGERY    ? CARPAL TUNNEL RELEASE Right 06/2012  ? CATARACT EXTRACTION W/PHACO Left 11/19/2014  ? Procedure: CATARACT EXTRACTION PHACO AND INTRAOCULAR LENS PLACEMENT ([facility]);  Surgeon: Williams Che, MD;  Location: [facility];  Service: Ophthalmology;  Laterality: Left;  CDE: 4.77  ? CATARACT EXTRACTION W/PHACO Right 02/03/2015  ? Procedure: CATARACT EXTRACTION PHACO AND INTRAOCULAR LENS PLACEMENT ([facility]);  Surgeon: Williams Che, MD;  Location: [facility];  Service: Ophthalmology;  Laterality: Right;  CDE:  4.54  ? COLONOSCOPY  06/19/2008  ? RMR: tortuous and elongated colon with scattered left-sided diverticula/colonic mucosa appeared entirely normal. Prior colonic ulcers had resolved.  ? COLONOSCOPY  12/2007  ? Dr. Gala Romney: Scattered diffuse sigmoid diverticula, 2 areas of ulceration at the hepatic flexure. Biopsies unremarkable.  ? COLONOSCOPY N/A 11/20/2012  ? next TCS 11/2017  ? COLONOSCOPY WITH PROPOFOL N/A 12/22/2017  ? Procedure: COLONOSCOPY WITH PROPOFOL;  Surgeon: Daneil Dolin, MD; diverticula in the sigmoid and descending colon and 2 tubular adenomas.  No recommendations to repeat.    ? DECOMPRESSIVE LUMBAR LAMINECTOMY LEVEL 2  04/20/2012  ? Procedure: DECOMPRESSIVE LUMBAR LAMINECTOMY LEVEL 2;  Surgeon: Tobi Bastos, MD;  Location: [facility];  Service: Orthopedics;  Laterality: Right;  Decompressive Lumbar Laminectomy of the L4 - L5 and L5 - S1 Complete/Laminectomy L5 on the Right (X-Ray)  ? ESOPHAGOGASTRODUODENOSCOPY  12/2007  ? Dr. Gala Romney: Possible cervical esophageal whip,  noncritical Schatzki ring status post dilation. Small hiatal hernia. Slightly pale duodenal mucosa (biopsy unremarkable)  ? ESOPHAGOGASTRODUODENOSCOPY (EGD) WITH PROPOFOL N/A 01/20/2017  ? Dr. Gala Romney: Medium

## 2021-06-10 ENCOUNTER — Ambulatory Visit (INDEPENDENT_AMBULATORY_CARE_PROVIDER_SITE_OTHER): Payer: PPO | Admitting: General Practice

## 2021-06-10 ENCOUNTER — Encounter (HOSPITAL_BASED_OUTPATIENT_CLINIC_OR_DEPARTMENT_OTHER): Payer: Self-pay | Admitting: General Practice

## 2021-06-10 VITALS — BP 136/84 | HR 88 | Ht 60.0 in | Wt 188.0 lb

## 2021-06-10 DIAGNOSIS — I5032 Chronic diastolic (congestive) heart failure: Secondary | ICD-10-CM

## 2021-06-10 DIAGNOSIS — M7989 Other specified soft tissue disorders: Secondary | ICD-10-CM

## 2021-06-10 DIAGNOSIS — I10 Essential (primary) hypertension: Secondary | ICD-10-CM | POA: Diagnosis not present

## 2021-06-10 DIAGNOSIS — E782 Mixed hyperlipidemia: Secondary | ICD-10-CM | POA: Diagnosis not present

## 2021-06-10 NOTE — Patient Instructions (Signed)
Medication Instructions:  ?Your Physician recommend you continue on your current medication as directed.   ? ?*If you need a refill on your cardiac medications before your next appointment, please call your pharmacy* ? ?Follow-Up: ?At [facility], you and your health needs are our priority.  As part of our continuing mission to provide you with exceptional heart care, we have created designated Provider Care Teams.  These Care Teams include your primary Cardiologist (physician) and Advanced Practice Providers (APPs -  Physician Assistants and Nurse Practitioners) who all work together to provide you with the care you need, when you need it. ? ?We recommend signing up for the patient portal called "MyChart".  Sign up information is provided on this After Visit Summary.  MyChart is used to connect with patients for Virtual Visits (Telemedicine).  Patients are able to view lab/test results, encounter notes, upcoming appointments, etc.  Non-urgent messages can be sent to your provider as well.   ?To learn more about what you can do with MyChart, go to [email or web address].   ? ?Your next appointment:   ?4-6 month(s) ? ?The format for your next appointment:   ?In Person ? ?Provider:   ?Elouise Munroe, MD { ? ?Other Instructions ?Recommend weighing daily and keeping a log. Please call our office if you have weight gain of 2 pounds overnight or 5 pounds in 1 week.  ? ?Date ? Time Weight  ? ?    ? ?    ? ?    ? ?    ? ?    ? ?    ? ?    ? ?    ? ?Tips to Measure your Blood Pressure Correctly ? ?To determine whether you have hypertension, a medical professional will take a blood pressure reading. How you prepare for the test, the position of your arm, and other factors can change a blood pressure reading by 10% or more. That could be enough to hide high blood pressure, start you on a drug you don't really need, or lead your doctor to incorrectly adjust your medications. ? ?National and international guidelines offer  specific instructions for measuring blood pressure. If a doctor, nurse, or medical assistant isn't doing it right, don't hesitate to ask him or her to get with the guidelines. ? ?Here's what you can do to ensure a correct reading: ? Don't drink a caffeinated beverage or smoke during the 30 minutes before the test. ? Sit quietly for five minutes before the test begins. ? During the measurement, sit in a chair with your feet on the floor and your arm supported so your elbow is at about heart level. ? The inflatable part of the cuff should completely cover at least 80% of your upper arm, and the cuff should be placed on bare skin, not over a shirt. ? Don't talk during the measurement. ? Have your blood pressure measured twice, with a brief break in between. If the readings are different by 5 points or more, have it done a third time. ? ?In 2017, new guidelines from the [place], the [employer] of Cardiology, and nine other health organizations lowered the diagnosis of high blood pressure to 130/80 mm Hg or higher for all adults. The guidelines also redefined the various blood pressure categories to now include normal, elevated, Stage 1 hypertension, Stage 2 hypertension, and hypertensive crisis (see "Blood pressure categories"). ? ?Blood pressure categories  ?Blood pressure category SYSTOLIC ?(upper number)  DIASTOLIC ?(lower number)  ?  Normal Less than 120 mm Hg and Less than 80 mm Hg  ?Elevated 120-129 mm Hg and Less than 80 mm Hg  ?High blood pressure: Stage 1 hypertension 130-139 mm Hg or 80-89 mm Hg  ?High blood pressure: Stage 2 hypertension 140 mm Hg or higher or 90 mm Hg or higher  ?Hypertensive crisis (consult your doctor immediately) Higher than 180 mm Hg and/or Higher than 120 mm Hg  ?Source: American Heart Association and American Stroke Association. ?For more on getting your blood pressure under control, buy Controlling Your Blood Pressure, a Special Health Report from [facility]. ? ? ?Blood Pressure Log ? ? ?Date ?  ?Time  ?Blood Pressure  ?Position  ?Example: Nov 1 9 AM 124/78 sitting  ? ?     ? ?     ? ?     ? ?     ? ?     ? ?     ? ?     ? ?     ? ? ? ? ? ? ?

## 2021-06-11 DIAGNOSIS — E211 Secondary hyperparathyroidism, not elsewhere classified: Secondary | ICD-10-CM | POA: Diagnosis not present

## 2021-06-11 DIAGNOSIS — N1832 Chronic kidney disease, stage 3b: Secondary | ICD-10-CM | POA: Diagnosis not present

## 2021-06-11 DIAGNOSIS — I5032 Chronic diastolic (congestive) heart failure: Secondary | ICD-10-CM | POA: Diagnosis not present

## 2021-06-11 DIAGNOSIS — E6609 Other obesity due to excess calories: Secondary | ICD-10-CM | POA: Diagnosis not present

## 2021-06-11 DIAGNOSIS — I129 Hypertensive chronic kidney disease with stage 1 through stage 4 chronic kidney disease, or unspecified chronic kidney disease: Secondary | ICD-10-CM | POA: Diagnosis not present

## 2021-06-19 DIAGNOSIS — J4550 Severe persistent asthma, uncomplicated: Secondary | ICD-10-CM | POA: Diagnosis not present

## 2021-06-19 DIAGNOSIS — D7219 Other eosinophilia: Secondary | ICD-10-CM | POA: Diagnosis not present

## 2021-06-22 DIAGNOSIS — I130 Hypertensive heart and chronic kidney disease with heart failure and stage 1 through stage 4 chronic kidney disease, or unspecified chronic kidney disease: Secondary | ICD-10-CM | POA: Diagnosis not present

## 2021-06-22 DIAGNOSIS — Z515 Encounter for palliative care: Secondary | ICD-10-CM | POA: Diagnosis not present

## 2021-06-22 DIAGNOSIS — R600 Localized edema: Secondary | ICD-10-CM | POA: Diagnosis not present

## 2021-06-22 DIAGNOSIS — Z7951 Long term (current) use of inhaled steroids: Secondary | ICD-10-CM | POA: Diagnosis not present

## 2021-06-22 DIAGNOSIS — J449 Chronic obstructive pulmonary disease, unspecified: Secondary | ICD-10-CM | POA: Diagnosis not present

## 2021-06-22 DIAGNOSIS — N183 Chronic kidney disease, stage 3 unspecified: Secondary | ICD-10-CM | POA: Diagnosis not present

## 2021-06-22 DIAGNOSIS — Z6838 Body mass index (BMI) 38.0-38.9, adult: Secondary | ICD-10-CM | POA: Diagnosis not present

## 2021-06-22 DIAGNOSIS — I509 Heart failure, unspecified: Secondary | ICD-10-CM | POA: Diagnosis not present

## 2021-07-22 ENCOUNTER — Other Ambulatory Visit (HOSPITAL_COMMUNITY): Payer: Self-pay | Admitting: Internal Medicine

## 2021-07-22 DIAGNOSIS — Z1231 Encounter for screening mammogram for malignant neoplasm of breast: Secondary | ICD-10-CM

## 2021-07-27 DIAGNOSIS — M160 Bilateral primary osteoarthritis of hip: Secondary | ICD-10-CM | POA: Diagnosis not present

## 2021-07-27 DIAGNOSIS — M4312 Spondylolisthesis, cervical region: Secondary | ICD-10-CM | POA: Diagnosis not present

## 2021-07-27 DIAGNOSIS — Z87891 Personal history of nicotine dependence: Secondary | ICD-10-CM | POA: Diagnosis not present

## 2021-07-27 DIAGNOSIS — J449 Chronic obstructive pulmonary disease, unspecified: Secondary | ICD-10-CM | POA: Diagnosis not present

## 2021-07-27 DIAGNOSIS — Z96611 Presence of right artificial shoulder joint: Secondary | ICD-10-CM | POA: Diagnosis not present

## 2021-07-27 DIAGNOSIS — R519 Headache, unspecified: Secondary | ICD-10-CM | POA: Diagnosis not present

## 2021-07-27 DIAGNOSIS — R52 Pain, unspecified: Secondary | ICD-10-CM | POA: Diagnosis not present

## 2021-07-27 DIAGNOSIS — N189 Chronic kidney disease, unspecified: Secondary | ICD-10-CM | POA: Diagnosis not present

## 2021-07-27 DIAGNOSIS — M1611 Unilateral primary osteoarthritis, right hip: Secondary | ICD-10-CM | POA: Diagnosis not present

## 2021-07-27 DIAGNOSIS — M419 Scoliosis, unspecified: Secondary | ICD-10-CM | POA: Diagnosis not present

## 2021-07-27 DIAGNOSIS — M47816 Spondylosis without myelopathy or radiculopathy, lumbar region: Secondary | ICD-10-CM | POA: Diagnosis not present

## 2021-07-27 DIAGNOSIS — S0083XA Contusion of other part of head, initial encounter: Secondary | ICD-10-CM | POA: Diagnosis not present

## 2021-07-27 DIAGNOSIS — M25571 Pain in right ankle and joints of right foot: Secondary | ICD-10-CM | POA: Diagnosis not present

## 2021-07-27 DIAGNOSIS — M7989 Other specified soft tissue disorders: Secondary | ICD-10-CM | POA: Diagnosis not present

## 2021-07-27 DIAGNOSIS — R079 Chest pain, unspecified: Secondary | ICD-10-CM | POA: Diagnosis not present

## 2021-07-27 DIAGNOSIS — M47812 Spondylosis without myelopathy or radiculopathy, cervical region: Secondary | ICD-10-CM | POA: Diagnosis not present

## 2021-07-27 DIAGNOSIS — M47817 Spondylosis without myelopathy or radiculopathy, lumbosacral region: Secondary | ICD-10-CM | POA: Diagnosis not present

## 2021-07-27 DIAGNOSIS — K219 Gastro-esophageal reflux disease without esophagitis: Secondary | ICD-10-CM | POA: Diagnosis not present

## 2021-07-27 DIAGNOSIS — E785 Hyperlipidemia, unspecified: Secondary | ICD-10-CM | POA: Diagnosis not present

## 2021-07-27 DIAGNOSIS — M542 Cervicalgia: Secondary | ICD-10-CM | POA: Diagnosis not present

## 2021-07-27 DIAGNOSIS — M79604 Pain in right leg: Secondary | ICD-10-CM | POA: Diagnosis not present

## 2021-07-27 DIAGNOSIS — W19XXXA Unspecified fall, initial encounter: Secondary | ICD-10-CM | POA: Diagnosis not present

## 2021-07-27 DIAGNOSIS — Z043 Encounter for examination and observation following other accident: Secondary | ICD-10-CM | POA: Diagnosis not present

## 2021-07-27 DIAGNOSIS — M25561 Pain in right knee: Secondary | ICD-10-CM | POA: Diagnosis not present

## 2021-07-27 DIAGNOSIS — S0990XA Unspecified injury of head, initial encounter: Secondary | ICD-10-CM | POA: Diagnosis not present

## 2021-07-27 DIAGNOSIS — I129 Hypertensive chronic kidney disease with stage 1 through stage 4 chronic kidney disease, or unspecified chronic kidney disease: Secondary | ICD-10-CM | POA: Diagnosis not present

## 2021-07-27 DIAGNOSIS — I700 Atherosclerosis of aorta: Secondary | ICD-10-CM | POA: Diagnosis not present

## 2021-07-27 DIAGNOSIS — G8929 Other chronic pain: Secondary | ICD-10-CM | POA: Diagnosis not present

## 2021-07-27 DIAGNOSIS — I10 Essential (primary) hypertension: Secondary | ICD-10-CM | POA: Diagnosis not present

## 2021-07-29 DIAGNOSIS — M542 Cervicalgia: Secondary | ICD-10-CM | POA: Diagnosis not present

## 2021-07-29 DIAGNOSIS — I5032 Chronic diastolic (congestive) heart failure: Secondary | ICD-10-CM | POA: Diagnosis not present

## 2021-07-29 DIAGNOSIS — Z742 Need for assistance at home and no other household member able to render care: Secondary | ICD-10-CM | POA: Diagnosis not present

## 2021-07-29 DIAGNOSIS — M810 Age-related osteoporosis without current pathological fracture: Secondary | ICD-10-CM | POA: Diagnosis not present

## 2021-07-29 DIAGNOSIS — W19XXXD Unspecified fall, subsequent encounter: Secondary | ICD-10-CM | POA: Diagnosis not present

## 2021-07-29 DIAGNOSIS — I10 Essential (primary) hypertension: Secondary | ICD-10-CM | POA: Diagnosis not present

## 2021-07-29 DIAGNOSIS — S0093XD Contusion of unspecified part of head, subsequent encounter: Secondary | ICD-10-CM | POA: Diagnosis not present

## 2021-07-29 DIAGNOSIS — R58 Hemorrhage, not elsewhere classified: Secondary | ICD-10-CM | POA: Diagnosis not present

## 2021-07-29 DIAGNOSIS — B372 Candidiasis of skin and nail: Secondary | ICD-10-CM | POA: Diagnosis not present

## 2021-08-07 ENCOUNTER — Telehealth: Payer: Self-pay

## 2021-08-07 ENCOUNTER — Telehealth: Payer: Self-pay | Admitting: Internal Medicine

## 2021-08-07 NOTE — Telephone Encounter (Signed)
Patient reports cramping in legs at night. She has taken magnesium 250 mg that helped before. She is now drinking 1/2 cup to a cup of tonic water at night to relieve the cramping. Her SOB has not changed (with exertion or at rest) over the years with her asthma. Her daily weights have been within range. Reminded her that her fluid intake should be between 32 to 40 ounces per day and that tonic water contains sodium. She voiced understanding. Her questions is can she take magnesium for muscle cramps at night?

## 2021-08-07 NOTE — Telephone Encounter (Signed)
Pt c/o swelling: STAT is pt has developed SOB within 24 hours  If swelling, where is the swelling located? Ankles and feet  How much weight have you gained and in what time span? Has not really gained any weight  Have you gained 3 pounds in a day or 5 pounds in a week? No  Do you have a log of your daily weights (if so, list)?  05/19 187 lbs  05/20 189 lbs 05/22 187 lbs 05/23 188 lbs 05/24 187 lbs 05/25 189 lbs  Are you currently taking a fluid pill? Yes   Are you currently SOB? No, but is having SOB on exertion. Patient has asthma   Have you traveled recently? No     Is also having cramps in her thighs, lower legs, and feet at night that have been occurring for a week. Wants to know if she can take magnesium for cramps.

## 2021-08-07 NOTE — Telephone Encounter (Signed)
Informed patient of pharm d response... "Patient may be dehydrating herself with a combination of tonic water and torsemide since tonic water has a high salt and sugar content and she takes 20-'40mg'$  of torsemide daily.  Do not see any recent magnesium levels on current lab work. Hesitant to recommend magnesium supplementation without knowing if she is deficient. She is also on potassium supplementation. If she prefers to try magnesium at night, recommend she try through dietary intake such as having a banana." Patient said she will try a banana. She had recent lab work from ED visit on 5/17. K+ 4.3. Patient will contact clinic if night cramps continue. She wanted to know what to do about having difficulty passing urine. I recommended that she contact her PCP.

## 2021-08-07 NOTE — Telephone Encounter (Signed)
Patient may be dehydrating herself with a combination of tonic water and torsemide since tonic water has a high salt and sugar content and she takes 20-'40mg'$  of torsemide daily.  Do not see any recent magnesium levels on current lab work. Hesitant to recommend magnesium supplementation without knowing if she is deficient. She is also on potassium supplementation. If she prefers to try magnesium at night, recommend she try through dietary intake such as having a banana.

## 2021-08-11 NOTE — Telephone Encounter (Signed)
Please see other telephone encounter.

## 2021-08-12 DIAGNOSIS — I509 Heart failure, unspecified: Secondary | ICD-10-CM | POA: Diagnosis not present

## 2021-08-12 DIAGNOSIS — M1712 Unilateral primary osteoarthritis, left knee: Secondary | ICD-10-CM | POA: Diagnosis not present

## 2021-08-12 DIAGNOSIS — Z7951 Long term (current) use of inhaled steroids: Secondary | ICD-10-CM | POA: Diagnosis not present

## 2021-08-12 DIAGNOSIS — M48061 Spinal stenosis, lumbar region without neurogenic claudication: Secondary | ICD-10-CM | POA: Diagnosis not present

## 2021-08-12 DIAGNOSIS — J449 Chronic obstructive pulmonary disease, unspecified: Secondary | ICD-10-CM | POA: Diagnosis not present

## 2021-08-12 DIAGNOSIS — Z79891 Long term (current) use of opiate analgesic: Secondary | ICD-10-CM | POA: Diagnosis not present

## 2021-08-12 DIAGNOSIS — G8929 Other chronic pain: Secondary | ICD-10-CM | POA: Diagnosis not present

## 2021-08-12 DIAGNOSIS — Z515 Encounter for palliative care: Secondary | ICD-10-CM | POA: Diagnosis not present

## 2021-08-14 DIAGNOSIS — N390 Urinary tract infection, site not specified: Secondary | ICD-10-CM | POA: Diagnosis not present

## 2021-08-14 DIAGNOSIS — J4550 Severe persistent asthma, uncomplicated: Secondary | ICD-10-CM | POA: Diagnosis not present

## 2021-08-14 DIAGNOSIS — D7219 Other eosinophilia: Secondary | ICD-10-CM | POA: Diagnosis not present

## 2021-08-24 ENCOUNTER — Ambulatory Visit (HOSPITAL_COMMUNITY): Payer: PPO

## 2021-08-27 ENCOUNTER — Ambulatory Visit (HOSPITAL_COMMUNITY): Payer: PPO

## 2021-08-27 ENCOUNTER — Ambulatory Visit (HOSPITAL_COMMUNITY)
Admission: RE | Admit: 2021-08-27 | Discharge: 2021-08-27 | Disposition: A | Discharge status: Home / Self Care | Payer: PPO | Source: Ambulatory Visit | Attending: Internal Medicine | Admitting: Internal Medicine

## 2021-08-27 DIAGNOSIS — R062 Wheezing: Secondary | ICD-10-CM | POA: Diagnosis not present

## 2021-08-27 DIAGNOSIS — R252 Cramp and spasm: Secondary | ICD-10-CM | POA: Diagnosis not present

## 2021-08-27 DIAGNOSIS — Z1231 Encounter for screening mammogram for malignant neoplasm of breast: Secondary | ICD-10-CM | POA: Diagnosis not present

## 2021-08-27 DIAGNOSIS — R58 Hemorrhage, not elsewhere classified: Secondary | ICD-10-CM | POA: Diagnosis not present

## 2021-09-02 ENCOUNTER — Encounter (HOSPITAL_COMMUNITY): Payer: PPO

## 2021-09-09 DIAGNOSIS — M48061 Spinal stenosis, lumbar region without neurogenic claudication: Secondary | ICD-10-CM | POA: Diagnosis not present

## 2021-09-09 DIAGNOSIS — M4180 Other forms of scoliosis, site unspecified: Secondary | ICD-10-CM | POA: Diagnosis not present

## 2021-09-09 DIAGNOSIS — N183 Chronic kidney disease, stage 3 unspecified: Secondary | ICD-10-CM | POA: Diagnosis not present

## 2021-09-09 DIAGNOSIS — G894 Chronic pain syndrome: Secondary | ICD-10-CM | POA: Diagnosis not present

## 2021-09-09 DIAGNOSIS — M961 Postlaminectomy syndrome, not elsewhere classified: Secondary | ICD-10-CM | POA: Diagnosis not present

## 2021-09-09 DIAGNOSIS — M5136 Other intervertebral disc degeneration, lumbar region: Secondary | ICD-10-CM | POA: Diagnosis not present

## 2021-09-18 DIAGNOSIS — E538 Deficiency of other specified B group vitamins: Secondary | ICD-10-CM | POA: Diagnosis not present

## 2021-09-18 DIAGNOSIS — E1122 Type 2 diabetes mellitus with diabetic chronic kidney disease: Secondary | ICD-10-CM | POA: Diagnosis not present

## 2021-09-24 DIAGNOSIS — Z7951 Long term (current) use of inhaled steroids: Secondary | ICD-10-CM | POA: Diagnosis not present

## 2021-09-24 DIAGNOSIS — Z515 Encounter for palliative care: Secondary | ICD-10-CM | POA: Diagnosis not present

## 2021-09-24 DIAGNOSIS — I110 Hypertensive heart disease with heart failure: Secondary | ICD-10-CM | POA: Diagnosis not present

## 2021-09-24 DIAGNOSIS — Z6838 Body mass index (BMI) 38.0-38.9, adult: Secondary | ICD-10-CM | POA: Diagnosis not present

## 2021-09-24 DIAGNOSIS — J449 Chronic obstructive pulmonary disease, unspecified: Secondary | ICD-10-CM | POA: Diagnosis not present

## 2021-09-24 DIAGNOSIS — I509 Heart failure, unspecified: Secondary | ICD-10-CM | POA: Diagnosis not present

## 2021-09-25 DIAGNOSIS — E1122 Type 2 diabetes mellitus with diabetic chronic kidney disease: Secondary | ICD-10-CM | POA: Diagnosis not present

## 2021-09-25 DIAGNOSIS — E782 Mixed hyperlipidemia: Secondary | ICD-10-CM | POA: Diagnosis not present

## 2021-09-25 DIAGNOSIS — I10 Essential (primary) hypertension: Secondary | ICD-10-CM | POA: Diagnosis not present

## 2021-09-25 DIAGNOSIS — M1990 Unspecified osteoarthritis, unspecified site: Secondary | ICD-10-CM | POA: Diagnosis not present

## 2021-09-25 DIAGNOSIS — J449 Chronic obstructive pulmonary disease, unspecified: Secondary | ICD-10-CM | POA: Diagnosis not present

## 2021-09-25 DIAGNOSIS — E875 Hyperkalemia: Secondary | ICD-10-CM | POA: Diagnosis not present

## 2021-09-25 DIAGNOSIS — F411 Generalized anxiety disorder: Secondary | ICD-10-CM | POA: Diagnosis not present

## 2021-09-25 DIAGNOSIS — D509 Iron deficiency anemia, unspecified: Secondary | ICD-10-CM | POA: Diagnosis not present

## 2021-09-25 DIAGNOSIS — Z87311 Personal history of (healed) other pathological fracture: Secondary | ICD-10-CM | POA: Diagnosis not present

## 2021-09-25 DIAGNOSIS — I5022 Chronic systolic (congestive) heart failure: Secondary | ICD-10-CM | POA: Diagnosis not present

## 2021-10-02 DIAGNOSIS — E6609 Other obesity due to excess calories: Secondary | ICD-10-CM | POA: Diagnosis not present

## 2021-10-02 DIAGNOSIS — N1832 Chronic kidney disease, stage 3b: Secondary | ICD-10-CM | POA: Diagnosis not present

## 2021-10-02 DIAGNOSIS — I5032 Chronic diastolic (congestive) heart failure: Secondary | ICD-10-CM | POA: Diagnosis not present

## 2021-10-02 DIAGNOSIS — E211 Secondary hyperparathyroidism, not elsewhere classified: Secondary | ICD-10-CM | POA: Diagnosis not present

## 2021-10-02 DIAGNOSIS — I129 Hypertensive chronic kidney disease with stage 1 through stage 4 chronic kidney disease, or unspecified chronic kidney disease: Secondary | ICD-10-CM | POA: Diagnosis not present

## 2021-10-09 DIAGNOSIS — D7219 Other eosinophilia: Secondary | ICD-10-CM | POA: Diagnosis not present

## 2021-10-09 DIAGNOSIS — J4550 Severe persistent asthma, uncomplicated: Secondary | ICD-10-CM | POA: Diagnosis not present

## 2021-10-12 DIAGNOSIS — B3731 Acute candidiasis of vulva and vagina: Secondary | ICD-10-CM | POA: Diagnosis not present

## 2021-10-20 DIAGNOSIS — Z09 Encounter for follow-up examination after completed treatment for conditions other than malignant neoplasm: Secondary | ICD-10-CM | POA: Diagnosis not present

## 2021-10-20 DIAGNOSIS — Z8619 Personal history of other infectious and parasitic diseases: Secondary | ICD-10-CM | POA: Diagnosis not present

## 2021-10-23 ENCOUNTER — Encounter: Payer: Self-pay | Admitting: Internal Medicine

## 2021-10-23 ENCOUNTER — Ambulatory Visit (INDEPENDENT_AMBULATORY_CARE_PROVIDER_SITE_OTHER): Payer: PPO | Admitting: Internal Medicine

## 2021-10-23 VITALS — BP 132/88 | HR 92 | Ht 60.0 in | Wt 193.0 lb

## 2021-10-23 DIAGNOSIS — I10 Essential (primary) hypertension: Secondary | ICD-10-CM | POA: Diagnosis not present

## 2021-10-23 DIAGNOSIS — Z79899 Other long term (current) drug therapy: Secondary | ICD-10-CM | POA: Diagnosis not present

## 2021-10-23 DIAGNOSIS — E782 Mixed hyperlipidemia: Secondary | ICD-10-CM

## 2021-10-23 DIAGNOSIS — R7989 Other specified abnormal findings of blood chemistry: Secondary | ICD-10-CM | POA: Diagnosis not present

## 2021-10-23 DIAGNOSIS — M7989 Other specified soft tissue disorders: Secondary | ICD-10-CM | POA: Diagnosis not present

## 2021-10-23 NOTE — Patient Instructions (Signed)
Medication Instructions:  No Changes In Medications at this time.   *If you need a refill on your cardiac medications before your next appointment, please call your pharmacy*    Follow-Up: At [facility], you and your health needs are our priority.  As part of our continuing mission to provide you with exceptional heart care, we have created designated Provider Care Teams.  These Care Teams include your primary Cardiologist (physician) and Advanced Practice Providers (APPs -  Physician Assistants and Nurse Practitioners) who all work together to provide you with the care you need, when you need it.  Your next appointment:   1 year(s)  The format for your next appointment:   In Person  Provider:   Elouise Munroe, MD

## 2021-10-23 NOTE — Progress Notes (Signed)
Cardiology Office Note:    Date:  10/23/2021   ID:  Glenda Terry, DOB Nov 13, 1940, MRN [id number]  PCP:  Celene Squibb, MD  Cardiologist:  Elouise Munroe, MD  Electrophysiologist:  None   Referring MD: Celene Squibb, MD   Chief Complaint/Reason for Referral: HFpEF  History of Present Illness:    Glenda Terry is a 81 y.o. female with a history of HTN, CKD stage 3, anxiety, COPD, cushing's syndrome, GERD, inflammatory arthritis, spinal stenosis, pulmonary eosinophilia, and HFpEF who presents for follow up. Her daughter [name] is with her and provides collaborative history. She is also my patient.   Has been previously evaluated for palpitations, cardiac monitor showed sinus tach and SR in 2014. Had an echo in 2017 that was grossly normal. Seen in 2018, no significant pathology identified.  Today:  She is feeling much better. She attributes this partially to worrying less about her condition. She is being fully compliant with her medication.  She is cutting back on tonic water. She drinks more water than 40 oz on hot days. She has no increased swelling from this.  She has a small amount of edema. Her legs usually have a small amount of swelling, and they are at a normal level currently. She regularly wears her compression socks. She has a small fat deposit on the ankle which bothers her. Compression socks and elevating the foot do not help it. D/w her podiatrist who does not recommend intervention.  She also complains of itchy skin on her legs. She is due to see dermatology for this.   The patient denies chest pain, chest pressure, PND, orthopnea. Denies cough, fever, chills. Denies nausea, vomiting. Denies syncope or presyncope. Denies dizziness or lightheadedness.   Past Medical History:  Diagnosis Date   Anxiety    Asthma    Back pain, chronic    CKD (chronic Terry disease) stage 3, GFR 30-59 ml/min (HCC)    Compression fx, thoracic spine (HCC)    T - 11   COPD  (chronic obstructive pulmonary disease) ([place]) 12/16/2015   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HCC)    Essential hypertension    GERD (gastroesophageal reflux disease)    HOH (hard of hearing)    Inflammatory arthritis    Iron deficiency anemia 01/2012   Pulmonary eosinophilia (HCC)    Spinal stenosis    Tubular adenoma 11/2012    Past Surgical History:  Procedure Laterality Date   ABDOMINAL HYSTERECTOMY     BACK SURGERY     CARPAL TUNNEL RELEASE Right 06/2012   CATARACT EXTRACTION W/PHACO Left 11/19/2014   Procedure: CATARACT EXTRACTION PHACO AND INTRAOCULAR LENS PLACEMENT ([place]);  Surgeon: Williams Che, MD;  Location: [facility];  Service: Ophthalmology;  Laterality: Left;  CDE: 4.77   CATARACT EXTRACTION W/PHACO Right 02/03/2015   Procedure: CATARACT EXTRACTION PHACO AND INTRAOCULAR LENS PLACEMENT ([facility]);  Surgeon: Williams Che, MD;  Location: [facility];  Service: Ophthalmology;  Laterality: Right;  CDE: 4.54   COLONOSCOPY  06/19/2008   RMR: tortuous and elongated colon with scattered left-sided diverticula/colonic mucosa appeared entirely normal. Prior colonic ulcers had resolved.   COLONOSCOPY  12/2007   Dr. Gala Romney: Scattered diffuse sigmoid diverticula, 2 areas of ulceration at the hepatic flexure. Biopsies unremarkable.   COLONOSCOPY N/A 11/20/2012   next TCS 11/2017   COLONOSCOPY WITH PROPOFOL N/A 12/22/2017   Procedure: COLONOSCOPY WITH PROPOFOL;  Surgeon: Daneil Dolin, MD; diverticula in the sigmoid and descending colon and 2 tubular adenomas.  No recommendations to repeat.     DECOMPRESSIVE LUMBAR LAMINECTOMY LEVEL 2  04/20/2012   Procedure: DECOMPRESSIVE LUMBAR LAMINECTOMY LEVEL 2;  Surgeon: Tobi Bastos, MD;  Location: [facility];  Service: Orthopedics;  Laterality: Right;  Decompressive Lumbar Laminectomy of the L4 - L5 and L5 - S1 Complete/Laminectomy L5 on the Right (X-Ray)   ESOPHAGOGASTRODUODENOSCOPY  12/2007   Dr. Gala Romney: Possible cervical esophageal whip, noncritical Schatzki ring  status post dilation. Small hiatal hernia. Slightly pale duodenal mucosa (biopsy unremarkable)   ESOPHAGOGASTRODUODENOSCOPY (EGD) WITH PROPOFOL N/A 01/20/2017   Dr. Gala Romney: Medium sized hiatal hernia empiric esophageal dilation for history of dysphagia   ESOPHAGOGASTRODUODENOSCOPY (EGD) WITH PROPOFOL N/A 04/08/2020   Surgeon: Eloise Harman, DO;  medium size hiatal hernia 3 to 4 cm chicken bone lodged in the gastric antrum just proximal to the pylorus removed with a snare with mild bleeding at the site of lodged bone.  Normal examined duodenum.   EYE SURGERY     BIL CATARACTS   FOREIGN BODY REMOVAL N/A 04/08/2020   Procedure: FOREIGN BODY REMOVAL;  Surgeon: Eloise Harman, DO;  [facility];  Service: Endoscopy;  Laterality: N/A;   INCISIONAL HERNIA REPAIR N/A 11/12/2020   Procedure: HERNIA REPAIR INCISIONAL;  Surgeon: Aviva Signs, MD;  Location: [facility];  Service: General;  Laterality: N/A;   IR KYPHO THORACIC WITH BONE BIOPSY  02/08/2017   IR RADIOLOGIST EVAL & MGMT  02/02/2017   JOINT REPLACEMENT     KNEE ARTHROSCOPY Right    MALONEY DILATION N/A 01/20/2017   Procedure: MALONEY DILATION;  Surgeon: Daneil Dolin, MD;  [facility]: [facility];  Service: Endoscopy;  Laterality: N/A;   POLYPECTOMY  12/22/2017   Procedure: POLYPECTOMY;  Surgeon: Daneil Dolin, MD;  [facility];  Service: Endoscopy;;   REVERSE SHOULDER ARTHROPLASTY Right 05/24/2014   Procedure: RIGHT SHOULDER REVERSE ARTHROPLASTY;  Surgeon: Netta Cedars, MD;  Location: [place];  Service: Orthopedics;  Laterality: Right;   REVERSE SHOULDER ARTHROPLASTY Left 06/10/2017   Procedure: LEFT REVERSE SHOULDER ARTHROPLASTY;  Surgeon: Netta Cedars, MD;  Location: [place];  Service: Orthopedics;  Laterality: Left;   TOTAL KNEE ARTHROPLASTY  01/24/2012   Procedure: TOTAL KNEE ARTHROPLASTY;  Surgeon: Gearlean Alf, MD;  Location: [facility];  Service: Orthopedics;  Laterality: Right;   TOTAL KNEE ARTHROPLASTY Left  10/15/2019   Procedure: TOTAL KNEE ARTHROPLASTY;  Surgeon: Gaynelle Arabian, MD;  [facility]: [facility];  Service: Orthopedics;  Laterality: Left;  61mn   TUBAL LIGATION      Current Medications: Current Meds  Medication Sig   Acetaminophen (APAP 500 PO) Take 2 tablets by mouth 2 (two) times daily as needed.   albuterol (PROAIR HFA) 108 (90 Base) MCG/ACT inhaler Inhale 2 puffs into the lungs every 6 (six) hours as needed for wheezing or shortness of breath (For COPD).    ALPRAZolam (XANAX) 0.5 MG tablet Take 0.5 tablets (0.25 mg total) by mouth 2 (two) times daily as needed for anxiety.   Benralizumab 30 MG/ML SOSY Inject 30 mg into the skin. Once A Day Every 56 Days   calcium carbonate (OS-CAL) 600 MG TABS tablet Take 600 mg by mouth 2 (two) times daily with a meal.   carvedilol (COREG) 6.25 MG tablet Take 1 tablet (6.25 mg total) by mouth 2 (two) times daily.   Cholecalciferol (VITAMIN D3) 50 MCG (2000 UT) CAPS Take 1 capsule by mouth daily.   denosumab (PROLIA) 60 MG/ML SOSY injection  Inject 60 mg into the skin every 6 (six) months.   diphenhydrAMINE (BENADRYL) 25 mg capsule Take 25 mg by mouth at bedtime.   DULERA 200-5 MCG/ACT AERO Inhale 2 puffs into the lungs 2 (two) times daily.   esomeprazole (NEXIUM) 40 MG capsule Take 1 capsule (40 mg total) by mouth 2 (two) times daily.   ferrous sulfate 325 (65 FE) MG tablet Take 1 tablet (325 mg total) by mouth daily with breakfast.   fexofenadine (ALLEGRA) 180 MG tablet Take 180 mg by mouth daily.   fluticasone (FLONASE) 50 MCG/ACT nasal spray Place 1 spray into both nostrils daily as needed for allergies or rhinitis.    ipratropium-albuterol (DUONEB) 0.5-2.5 (3) MG/3ML SOLN Take 3 mLs by nebulization every 4 (four) hours as needed.   ketotifen (ZADITOR) 0.025 % ophthalmic solution Place 1 drop into both eyes 2 (two) times daily as needed (allergies).   montelukast (SINGULAIR) 10 MG tablet Take 1 tablet (10 mg total) by mouth at bedtime.   Oxycodone  HCl 10 MG TABS Take 10 mg by mouth every 6 (six) hours.   Polyethyl Glycol-Propyl Glycol (SYSTANE OP) Place 1 drop into both eyes 3 (three) times daily as needed (dry/irritated eyes.).    polyethylene glycol (MIRALAX / GLYCOLAX) 17 g packet Take 17 g by mouth daily.   potassium chloride (KLOR-CON) 10 MEQ tablet Take 40 meq(4 tab) on Monday, Wednesday and Friday, all other days take 20 meq(2 tab) by mouth daily, Take additional 71mq(2tab) today, and 651m(6tab) x3days then back to regular dosing   pravastatin (PRAVACHOL) 10 MG tablet Take 10 mg by mouth daily.   torsemide (DEMADEX) 10 MG tablet Take 10 mg by mouth daily. Pt takes 10 mg on  TUE, THUR, SAT, SUNDAY, AND 20 MG ON MON. WED AND FRIDAY   vitamin C (ASCORBIC ACID) 500 MG tablet Take 500 mg by mouth daily.     Allergies:   Flexeril [cyclobenzaprine], Other, Keflex [cephalexin], Aspirin, Fentanyl, Hydrocodone, Adhesive [tape], and Chlorhexidine   Social History   Tobacco Use   Smoking status: Former    Packs/day: 2.00    Years: 40.00    Total pack years: 80.00    Types: Cigarettes    Quit date: 01/19/1992    Years since quitting: 29.7   Smokeless tobacco: Never  Vaping Use   Vaping Use: Never used  Substance Use Topics   Alcohol use: No   Drug use: No     Family History: The patient's family history includes Breast cancer in her sister; Colon cancer in her sister; Heart attack in her mother; Hypertension in her father and mother; Prostate cancer in her father; Stroke in her mother.  ROS:   Please see the history of present illness.    (+) Minor Edema  (+) Itchy and blotchy skin on legs   All other systems reviewed and are negative.  EKGs/Labs/Other Studies Reviewed:    The following studies were reviewed today:  Studies:  Echocardiogram 08/01/20: FINDINGS   Left Ventricle: Left ventricular ejection fraction, by estimation, is 60  to 65%. The left ventricle has normal function. The left ventricle has no  regional  wall motion abnormalities. The left ventricular internal cavity  size was normal in size. There is   no left ventricular hypertrophy. Left ventricular diastolic parameters  are consistent with Grade I diastolic dysfunction (impaired relaxation).   Right Ventricle: The right ventricular size is normal. No increase in  right ventricular wall thickness. Right ventricular systolic function  is  normal.   Left Atrium: Left atrial size was normal in size.   Right Atrium: Right atrial size was normal in size.   Pericardium: There is no evidence of pericardial effusion.   Mitral Valve: The mitral valve is normal in structure. No evidence of  mitral valve regurgitation.   Tricuspid Valve: The tricuspid valve is normal in structure. Tricuspid  valve regurgitation is not demonstrated. No evidence of tricuspid  stenosis.   Aortic Valve: The aortic valve is tricuspid. Aortic valve regurgitation is  not visualized. No aortic stenosis is present.   Pulmonic Valve: The pulmonic valve was normal in structure. Pulmonic valve  regurgitation is not visualized. No evidence of pulmonic stenosis.   Aorta: The aortic root and ascending aorta are structurally normal, with  no evidence of dilitation.   Venous: The inferior vena cava is normal in size with greater than 50%  respiratory variability, suggesting right atrial pressure of 3 mmHg.   IAS/Shunts: The atrial septum is grossly normal.   Non-Telemetry Monitoring 02/24/17:  Available strips from 7 day event recorder reviewed. There was only one returned strip that demonstrated sinus tachycardia.  EKG: EKG independently reviewed  10/23/21: normal sinus rhythm  Recent Labs: 01/06/2021: B Natriuretic Peptide 50.0 01/07/2021: ALT 21; Hemoglobin 10.9; Platelets 243 01/09/2021: Magnesium 2.4 04/28/2021: BUN 15; Creatinine, Ser 1.31; Potassium 4.1; Sodium 140  Recent Lipid Panel No results found for: "CHOL", "TRIG", "HDL", "CHOLHDL", "VLDL",  "LDLCALC", "LDLDIRECT"  Physical Exam:    VS:  BP 132/88   Pulse 92   Ht 5' (1.524 m)   Wt 193 lb (87.5 kg)   SpO2 97%   BMI 37.69 kg/m     Wt Readings from Last 5 Encounters:  10/23/21 193 lb (87.5 kg)  06/10/21 188 lb (85.3 kg)  05/08/21 184 lb 3.2 oz (83.6 kg)  04/17/21 191 lb (86.6 kg)  03/27/21 183 lb (83 kg)    Constitutional: No acute distress Eyes: sclera non-icteric, normal conjunctiva and lids ENMT: normal dentition, moist mucous membranes Cardiovascular: regular rhythm, normal rate, no murmurs. S1 and S2 normal. No jugular venous distention.  Respiratory: clear to auscultation bilaterally GI : normal bowel sounds, soft and nontender. No distention.   MSK: extremities warm, well perfused. 1+ bilat edema puffy nonpitting.  NEURO: grossly nonfocal exam, moves all extremities. PSYCH: alert and oriented x 3, normal mood and affect.   ASSESSMENT:    1. Essential hypertension   2. Medication management   3. Mixed hyperlipidemia   4. Swelling of lower extremity   5. Primary hypertension   6. Elevated serum creatinine   7. Hypertension, unspecified type     PLAN:    Hypertension, unspecified type - Swelling of lower extremity -  - Doing really well currently. Class I symptoms.  - continue amlodipine 5 mg, carvedilol 6.25 mg BID, torsemide 10 mg TThSat, 20 mg MWF.  Mixed hyperlipidemia - on pravastatin 10 mg daily, most recent lipids from Nov at OSH show LDL 107, Trig 66, HDL 56. Could uptitrate, will consider at follow up. Patient prefers no changes.  Lower extremity edema - torsemide at stable dose. Compression socks.   Total time of encounter: 30 minutes total time of encounter, including 20 minutes spent in face-to-face patient care on the date of this encounter. This time includes coordination of care and counseling regarding above mentioned problem list. Remainder of non-face-to-face time involved reviewing chart documents/testing relevant to the patient  encounter and documentation in the medical record.  I have independently reviewed documentation from referring provider.   Cherlynn Kaiser, MD, [facility]   Medication Adjustments/Labs and Tests Ordered: Current medicines are reviewed at length with the patient today.  Concerns regarding medicines are outlined above.   Orders Placed This Encounter  Procedures   EKG 12-Lead    No orders of the defined types were placed in this encounter.     Patient Instructions  Medication Instructions:  No Changes In Medications at this time.   *If you need a refill on your cardiac medications before your next appointment, please call your pharmacy*    Follow-Up: At [facility], you and your health needs are our priority.  As part of our continuing mission to provide you with exceptional heart care, we have created designated Provider Care Teams.  These Care Teams include your primary Cardiologist (physician) and Advanced Practice Providers (APPs -  Physician Assistants and Nurse Practitioners) who all work together to provide you with the care you need, when you need it.  Your next appointment:   1 year(s)  The format for your next appointment:   In Person  Provider:   Elouise Munroe, MD               I,Mary Mosetta Pigeon Buren,acting as a scribe for Elouise Munroe, MD.,have documented all relevant documentation on the behalf of Elouise Munroe, MD,as directed by  Elouise Munroe, MD while in the presence of Elouise Munroe, MD.   I, Elouise Munroe, MD, have reviewed all documentation for this visit. The documentation on 10/23/21 for the exam, diagnosis, procedures, and orders are all accurate and complete.

## 2021-11-03 DIAGNOSIS — E538 Deficiency of other specified B group vitamins: Secondary | ICD-10-CM | POA: Diagnosis not present

## 2021-11-03 DIAGNOSIS — M797 Fibromyalgia: Secondary | ICD-10-CM | POA: Diagnosis not present

## 2021-11-05 ENCOUNTER — Emergency Department (HOSPITAL_COMMUNITY): Payer: PPO

## 2021-11-05 ENCOUNTER — Other Ambulatory Visit: Payer: Self-pay

## 2021-11-05 ENCOUNTER — Encounter (HOSPITAL_COMMUNITY): Payer: Self-pay | Admitting: Emergency Medicine

## 2021-11-05 ENCOUNTER — Emergency Department (HOSPITAL_COMMUNITY)
Admission: EM | Admit: 2021-11-05 | Discharge: 2021-11-05 | Disposition: A | Discharge status: Home / Self Care | Payer: PPO | Attending: Emergency Medicine | Admitting: Emergency Medicine

## 2021-11-05 DIAGNOSIS — K5730 Diverticulosis of large intestine without perforation or abscess without bleeding: Secondary | ICD-10-CM | POA: Diagnosis not present

## 2021-11-05 DIAGNOSIS — I10 Essential (primary) hypertension: Secondary | ICD-10-CM | POA: Diagnosis not present

## 2021-11-05 DIAGNOSIS — K219 Gastro-esophageal reflux disease without esophagitis: Secondary | ICD-10-CM | POA: Diagnosis not present

## 2021-11-05 DIAGNOSIS — R1013 Epigastric pain: Secondary | ICD-10-CM | POA: Diagnosis present

## 2021-11-05 HISTORY — DX: Diverticulitis of intestine, part unspecified, without perforation or abscess without bleeding: K57.92

## 2021-11-05 LAB — COMPREHENSIVE METABOLIC PANEL
ALT: 16 U/L (ref 0–44)
AST: 18 U/L (ref 15–41)
Albumin: 3.8 g/dL (ref 3.5–5.0)
Alkaline Phosphatase: 46 U/L (ref 38–126)
Anion gap: 10 (ref 5–15)
BUN: 11 mg/dL (ref 8–23)
CO2: 28 mmol/L (ref 22–32)
Calcium: 8.3 mg/dL — ABNORMAL LOW (ref 8.9–10.3)
Chloride: 100 mmol/L (ref 98–111)
Creatinine, Ser: 1.14 mg/dL — ABNORMAL HIGH (ref 0.44–1.00)
GFR, Estimated: 49 mL/min — ABNORMAL LOW (ref >60)
Glucose, Bld: 112 mg/dL — ABNORMAL HIGH (ref 70–99)
Potassium: 3.8 mmol/L (ref 3.5–5.1)
Sodium: 138 mmol/L (ref 135–145)
Total Bilirubin: 0.9 mg/dL (ref 0.3–1.2)
Total Protein: 7.2 g/dL (ref 6.5–8.1)

## 2021-11-05 LAB — CBC
HCT: 43.1 % (ref 36.0–46.0)
Hemoglobin: 13.2 g/dL (ref 12.0–15.0)
MCH: 25.2 pg — ABNORMAL LOW (ref 26.0–34.0)
MCHC: 30.6 g/dL (ref 30.0–36.0)
MCV: 82.4 fL (ref 80.0–100.0)
Platelets: 324 10*3/uL (ref 150–400)
RBC: 5.23 MIL/uL — ABNORMAL HIGH (ref 3.87–5.11)
RDW: 14.4 % (ref 11.5–15.5)
WBC: 7.5 10*3/uL (ref 4.0–10.5)
nRBC: 0 % (ref 0.0–0.2)

## 2021-11-05 LAB — URINALYSIS, ROUTINE W REFLEX MICROSCOPIC
Bilirubin Urine: NEGATIVE
Glucose, UA: NEGATIVE mg/dL
Hgb urine dipstick: NEGATIVE
Ketones, ur: NEGATIVE mg/dL
Leukocytes,Ua: NEGATIVE
Nitrite: NEGATIVE
Protein, ur: NEGATIVE mg/dL
Specific Gravity, Urine: 1.044 — ABNORMAL HIGH (ref 1.005–1.030)
pH: 7 (ref 5.0–8.0)

## 2021-11-05 LAB — LIPASE, BLOOD: Lipase: 27 U/L (ref 11–51)

## 2021-11-05 LAB — TROPONIN I (HIGH SENSITIVITY): Troponin I (High Sensitivity): 7 ng/L (ref <18)

## 2021-11-05 MED ORDER — IOHEXOL 300 MG/ML  SOLN
100.0000 mL | Freq: Once | INTRAMUSCULAR | Status: AC | PRN
  Administered 2021-11-05: 75 mL via INTRAVENOUS

## 2021-11-05 MED ORDER — ONDANSETRON HCL 4 MG/2ML IJ SOLN
4.0000 mg | Freq: Once | INTRAMUSCULAR | Status: AC
  Administered 2021-11-05: 4 mg via INTRAVENOUS
  Filled 2021-11-05: qty 2

## 2021-11-05 MED ORDER — HYDROMORPHONE HCL 1 MG/ML IJ SOLN
0.5000 mg | Freq: Once | INTRAMUSCULAR | Status: AC
  Administered 2021-11-05: 0.5 mg via INTRAVENOUS
  Filled 2021-11-05: qty 0.5

## 2021-11-05 MED ORDER — SUCRALFATE 1 GM/10ML PO SUSP: 1.0000 g | Freq: Three times a day (TID) | ORAL | 0 refills | Status: DC

## 2021-11-05 MED ORDER — ALUM & MAG HYDROXIDE-SIMETH 200-200-20 MG/5ML PO SUSP
30.0000 mL | Freq: Once | ORAL | Status: AC
  Administered 2021-11-05: 30 mL via ORAL
  Filled 2021-11-05: qty 30

## 2021-11-05 MED ORDER — SODIUM CHLORIDE 0.9 % IV BOLUS
500.0000 mL | Freq: Once | INTRAVENOUS | Status: AC
  Administered 2021-11-05: 500 mL via INTRAVENOUS

## 2021-11-05 MED ORDER — ONDANSETRON 4 MG PO TBDP: 4.0000 mg | ORAL_TABLET | Freq: Three times a day (TID) | ORAL | 0 refills | Status: DC | PRN

## 2021-11-05 NOTE — ED Triage Notes (Signed)
Pt presents with abdominal pain x 2 days, history diverticulitis.

## 2021-11-05 NOTE — ED Provider Notes (Signed)
[facility] EMERGENCY DEPARTMENT Provider Note   CSN: [id number] Arrival date & time: 11/05/21  1812     History  Chief Complaint  Patient presents with   Abdominal Pain    Glenda Terry is a 81 y.o. female.  HPI 81 year old female presents with abdominal pain.  Started 2 nights ago.  Is primarily lower but also has been epigastric.  No chest pain or fevers.  She has had nausea without vomiting.  No diarrhea or urinary symptoms.  The pain is severe.  This feels like when she has had diverticulitis in the past.  Home Medications Prior to Admission medications   Medication Sig Start Date End Date Taking? Authorizing Provider  ondansetron (ZOFRAN-ODT) 4 MG disintegrating tablet Take 1 tablet (4 mg total) by mouth every 8 (eight) hours as needed for nausea or vomiting. 11/05/21  Yes Sherwood Gambler, MD  sucralfate (CARAFATE) 1 GM/10ML suspension Take 10 mLs (1 g total) by mouth 4 (four) times daily -  with meals and at bedtime. 11/05/21  Yes Sherwood Gambler, MD  Acetaminophen (APAP 500 PO) Take 2 tablets by mouth 2 (two) times daily as needed.    [provider]  albuterol (PROAIR HFA) 108 (90 Base) MCG/ACT inhaler Inhale 2 puffs into the lungs every 6 (six) hours as needed for wheezing or shortness of breath (For COPD).     [provider]  ALPRAZolam Duanne Moron) 0.5 MG tablet Take 0.5 tablets (0.25 mg total) by mouth 2 (two) times daily as needed for anxiety. 10/25/19   Gerlene Fee, NP  amLODipine (NORVASC) 5 MG tablet Take 1 tablet (5 mg total) by mouth daily. 03/27/21 06/25/21  Minus Breeding, MD  Benralizumab 30 MG/ML SOSY Inject 30 mg into the skin. Once A Day Every 56 Days 10/17/19   [provider]  calcium carbonate (OS-CAL) 600 MG TABS tablet Take 600 mg by mouth 2 (two) times daily with a meal.    [provider]  carvedilol (COREG) 6.25 MG tablet Take 1 tablet (6.25 mg total) by mouth 2 (two) times daily. 05/08/21   Minus Breeding, MD   Cholecalciferol (VITAMIN D3) 50 MCG (2000 UT) CAPS Take 1 capsule by mouth daily.    [provider]  denosumab (PROLIA) 60 MG/ML SOSY injection Inject 60 mg into the skin every 6 (six) months. 03/01/18   [provider]  diphenhydrAMINE (BENADRYL) 25 mg capsule Take 25 mg by mouth at bedtime.    [provider]  DULERA 200-5 MCG/ACT AERO Inhale 2 puffs into the lungs 2 (two) times daily. 10/25/19   Gerlene Fee, NP  esomeprazole (NEXIUM) 40 MG capsule Take 1 capsule (40 mg total) by mouth 2 (two) times daily. 10/25/19   Gerlene Fee, NP  ferrous sulfate 325 (65 FE) MG tablet Take 1 tablet (325 mg total) by mouth daily with breakfast. 10/25/19   Gerlene Fee, NP  fexofenadine (ALLEGRA) 180 MG tablet Take 180 mg by mouth daily.    [provider]  fluticasone (FLONASE) 50 MCG/ACT nasal spray Place 1 spray into both nostrils daily as needed for allergies or rhinitis.  10/17/19   [provider]  ipratropium-albuterol (DUONEB) 0.5-2.5 (3) MG/3ML SOLN Take 3 mLs by nebulization every 4 (four) hours as needed.    [provider]  ketotifen (ZADITOR) 0.025 % ophthalmic solution Place 1 drop into both eyes 2 (two) times daily as needed (allergies). 10/25/19   Gerlene Fee, NP  montelukast (SINGULAIR) 10  MG tablet Take 1 tablet (10 mg total) by mouth at bedtime. 07/25/20   Elouise Munroe, MD  Oxycodone HCl 10 MG TABS Take 10 mg by mouth every 6 (six) hours. 12/29/19   [provider]  Polyethyl Glycol-Propyl Glycol (SYSTANE OP) Place 1 drop into both eyes 3 (three) times daily as needed (dry/irritated eyes.).     [provider]  polyethylene glycol (MIRALAX / GLYCOLAX) 17 g packet Take 17 g by mouth daily. 11/07/20   Tegeler, Gwenyth Allegra, MD  potassium chloride (KLOR-CON) 10 MEQ tablet Take 40 meq(4 tab) on Monday, Wednesday and Friday, all other days take 20 meq(2 tab) by mouth daily, Take additional 54mq(2tab) today,  and 641m(6tab) x3days then back to regular dosing 04/24/21   ClDeberah PeltonNP  pravastatin (PRAVACHOL) 10 MG tablet Take 10 mg by mouth daily. 03/27/20   [provider]  torsemide (DEMADEX) 10 MG tablet Take 10 mg by mouth daily. Pt takes 10 mg on  TUE, THUR, SAT, SUNDAY, AND 20 MG ON MON. WED AND FRIDAY    [provider]  vitamin C (ASCORBIC ACID) 500 MG tablet Take 500 mg by mouth daily.    [provider]      Allergies    Flexeril [cyclobenzaprine], Other, Keflex [cephalexin], Aspirin, Fentanyl, Hydrocodone, Adhesive [tape], and Chlorhexidine    Review of Systems   Review of Systems  Constitutional:  Negative for fever.  Cardiovascular:  Negative for chest pain.  Gastrointestinal:  Positive for abdominal pain and nausea. Negative for diarrhea and vomiting.  Genitourinary:  Negative for dysuria.  Musculoskeletal:  Negative for back pain.    Physical Exam Updated Vital Signs BP (!) 140/87   Pulse 86   Temp 97.6 F (36.4 C) (Oral)   Resp 13   Ht 5' (1.524 m)   Wt 86.2 kg   SpO2 95%   BMI 37.11 kg/m  Physical Exam Vitals and nursing note reviewed.  Constitutional:      Appearance: She is well-developed. She is not ill-appearing or diaphoretic.  HENT:     Head: Normocephalic and atraumatic.  Cardiovascular:     Rate and Rhythm: Normal rate and regular rhythm.     Heart sounds: Normal heart sounds.  Pulmonary:     Effort: Pulmonary effort is normal.     Breath sounds: Normal breath sounds.  Abdominal:     Palpations: Abdomen is soft.     Tenderness: There is abdominal tenderness in the right lower quadrant, suprapubic area and left lower quadrant.  Skin:    General: Skin is warm and dry.  Neurological:     Mental Status: She is alert.     ED Results / Procedures / Treatments   Labs (all labs ordered are listed, but only abnormal results are displayed) Labs Reviewed  COMPREHENSIVE METABOLIC PANEL - Abnormal; Notable for the  following components:      Result Value   Glucose, Bld 112 (*)    Creatinine, Ser 1.14 (*)    Calcium 8.3 (*)    GFR, Estimated 49 (*)    All other components within normal limits  CBC - Abnormal; Notable for the following components:   RBC 5.23 (*)    MCH 25.2 (*)    All other components within normal limits  URINALYSIS, ROUTINE W REFLEX MICROSCOPIC - Abnormal; Notable for the following components:   Specific Gravity, Urine 1.044 (*)    All other components within normal limits  LIPASE, BLOOD  TROPONIN I (HIGH SENSITIVITY)    EKG EKG Interpretation  Date/Time:  Thursday November 05 2021 18:58:14 EDT Ventricular Rate:  76 PR Interval:  144 QRS Duration: 96 QT Interval:  412 QTC Calculation: 464 R Axis:   -26 Text Interpretation: Sinus rhythm Borderline left axis deviation Low voltage, extremity leads Baseline wander in lead(s) V3 V4 Confirmed by Sherwood Gambler [id number]) on 11/05/2021 7:00:25 PM  Radiology CT ABDOMEN PELVIS W CONTRAST  Result Date: 11/05/2021 CLINICAL DATA:  Left lower quadrant abdominal pain. EXAM: CT ABDOMEN AND PELVIS WITH CONTRAST TECHNIQUE: Multidetector CT imaging of the abdomen and pelvis was performed using the standard protocol following bolus administration of intravenous contrast. RADIATION DOSE REDUCTION: This exam was performed according to the departmental dose-optimization program which includes automated exposure control, adjustment of the mA and/or kV according to patient size and/or use of iterative reconstruction technique. CONTRAST:  43m OMNIPAQUE IOHEXOL 300 MG/ML  SOLN COMPARISON:  February 21, 2021 FINDINGS: Lower chest: No acute abnormality. Hepatobiliary: No focal liver abnormality is seen. The gallbladder is mildly distended, without evidence of gallstones, gallbladder wall thickening or pericholecystic inflammatory fat stranding. The common bile duct measures 7.5 mm in diameter. Pancreas: In ill-defined 1.4 cm x 1.0 cm x 0.8 cm focus of  parenchymal low attenuation is seen within the head of the pancreas (axial CT images 22 through 24, CT series 2). No pancreatic ductal dilatation or surrounding inflammatory changes. Spleen: Normal in size without focal abnormality. Adrenals/Urinary Tract: Adrenal glands are unremarkable. Kidneys are normal, without renal calculi, focal lesion, or hydronephrosis. The urinary bladder is poorly distended and subsequently limited in evaluation. Stomach/Bowel: There is a stable moderate-sized hiatal hernia. Appendix appears normal. No evidence of bowel wall thickening, distention, or inflammatory changes. Noninflamed diverticula are seen throughout the sigmoid colon. Vascular/Lymphatic: Aortic atherosclerosis. No enlarged abdominal or pelvic lymph nodes. Reproductive: Status post hysterectomy. No adnexal masses. Other: No abdominal wall hernia or abnormality. No abdominopelvic ascites. Musculoskeletal: Multilevel degenerative changes are seen throughout the lumbar spine. IMPRESSION: 1. Small area of parenchymal low attenuation within the head of the pancreas which may represent a pancreatic cyst. Further evaluation with pancreatic protocol MRI is recommended. 2. Stable moderate-sized hiatal hernia. 3. Sigmoid diverticulosis. 4. Aortic atherosclerosis. Aortic Atherosclerosis (ICD10-I70.0). Electronically Signed   By: TVirgina NorfolkM.D.   On: 11/05/2021 20:20    Procedures Procedures    Medications Ordered in ED Medications  HYDROmorphone (DILAUDID) injection 0.5 mg (0.5 mg Intravenous Given 11/05/21 1914)  sodium chloride 0.9 % bolus 500 mL (0 mLs Intravenous Stopped 11/05/21 2046)  ondansetron (ZOFRAN) injection 4 mg (4 mg Intravenous Given 11/05/21 1913)  iohexol (OMNIPAQUE) 300 MG/ML solution 100 mL (75 mLs Intravenous Contrast Given 11/05/21 1953)  alum & mag hydroxide-simeth (MAALOX/MYLANTA) 200-200-20 MG/5ML suspension 30 mL (30 mLs Oral Given 11/05/21 2131)    ED Course/ Medical Decision Making/  A&P Clinical Course as of 11/06/21 [id number] Thu Nov 05, 2021  2135 Patient is now complaining of more of a chest and abdominal burning sensation.  I think ACS is less likely but we will add on a troponin and try GI cocktail.  Her work-up is otherwise unremarkable.  She is otherwise feeling better. [SG]    Clinical Course User Index [SG] GSherwood Gambler MD                           Medical Decision Making Amount and/or Complexity of Data Reviewed Labs: ordered. Radiology:  ordered.  Risk OTC drugs. Prescription drug management.   Patient with diffuse abdominal pain. CT was obtained. Personally viewed/interpreted. No SBO, diverticulitis, etc. UA unremarkable. Added on troponin as above, but history sounds more like GERD at this point. Trop negative after days of symptoms. Has a prior history of gastritis/GERD. Now much better after maalox. At this point is stable for d/c. Given return precautions.         Final Clinical Impression(s) / ED Diagnoses Final diagnoses:  Gastroesophageal reflux disease, unspecified whether esophagitis present    Rx / DC Orders ED Discharge Orders          Ordered    sucralfate (CARAFATE) 1 GM/10ML suspension  3 times daily with meals & bedtime        11/05/21 2249    ondansetron (ZOFRAN-ODT) 4 MG disintegrating tablet  Every 8 hours PRN        11/05/21 2312              Sherwood Gambler, MD 11/06/21 [phone]

## 2021-11-05 NOTE — Discharge Instructions (Addendum)
If you develop worsening, continued, or recurrent abdominal pain, uncontrolled vomiting, fever, chest or back pain, or any other new/concerning symptoms then return to the ER for evaluation.  

## 2021-11-08 NOTE — Progress Notes (Unsigned)
GI Office Note    Referring Provider: Celene Squibb, MD Primary Care Physician:  Celene Squibb, MD  Primary Gastroenterologist:  Chief Complaint   No chief complaint on file.   History of Present Illness   Glenda Terry is a 81 y.o. female presenting today for follow up. Last seen in 10/2020.         Medications   Current Outpatient Medications  Medication Sig Dispense Refill   Acetaminophen (APAP 500 PO) Take 2 tablets by mouth 2 (two) times daily as needed.     albuterol (PROAIR HFA) 108 (90 Base) MCG/ACT inhaler Inhale 2 puffs into the lungs every 6 (six) hours as needed for wheezing or shortness of breath (For COPD).      ALPRAZolam (XANAX) 0.5 MG tablet Take 0.5 tablets (0.25 mg total) by mouth 2 (two) times daily as needed for anxiety. 10 tablet 0   amLODipine (NORVASC) 5 MG tablet Take 1 tablet (5 mg total) by mouth daily. 90 tablet 3   Benralizumab 30 MG/ML SOSY Inject 30 mg into the skin. Once A Day Every 56 Days     calcium carbonate (OS-CAL) 600 MG TABS tablet Take 600 mg by mouth 2 (two) times daily with a meal.     carvedilol (COREG) 6.25 MG tablet Take 1 tablet (6.25 mg total) by mouth 2 (two) times daily. 180 tablet 3   Cholecalciferol (VITAMIN D3) 50 MCG (2000 UT) CAPS Take 1 capsule by mouth daily.     denosumab (PROLIA) 60 MG/ML SOSY injection Inject 60 mg into the skin every 6 (six) months.     diphenhydrAMINE (BENADRYL) 25 mg capsule Take 25 mg by mouth at bedtime.     DULERA 200-5 MCG/ACT AERO Inhale 2 puffs into the lungs 2 (two) times daily. 8.8 g 0   esomeprazole (NEXIUM) 40 MG capsule Take 1 capsule (40 mg total) by mouth 2 (two) times daily. 60 capsule 0   ferrous sulfate 325 (65 FE) MG tablet Take 1 tablet (325 mg total) by mouth daily with breakfast. 30 tablet 0   fexofenadine (ALLEGRA) 180 MG tablet Take 180 mg by mouth daily.     fluticasone (FLONASE) 50 MCG/ACT nasal spray Place 1 spray into both nostrils daily as needed for allergies  or rhinitis.      ipratropium-albuterol (DUONEB) 0.5-2.5 (3) MG/3ML SOLN Take 3 mLs by nebulization every 4 (four) hours as needed.     ketotifen (ZADITOR) 0.025 % ophthalmic solution Place 1 drop into both eyes 2 (two) times daily as needed (allergies). 5 mL 0   montelukast (SINGULAIR) 10 MG tablet Take 1 tablet (10 mg total) by mouth at bedtime. 90 tablet 2   ondansetron (ZOFRAN-ODT) 4 MG disintegrating tablet Take 1 tablet (4 mg total) by mouth every 8 (eight) hours as needed for nausea or vomiting. 10 tablet 0   Oxycodone HCl 10 MG TABS Take 10 mg by mouth every 6 (six) hours.     Polyethyl Glycol-Propyl Glycol (SYSTANE OP) Place 1 drop into both eyes 3 (three) times daily as needed (dry/irritated eyes.).      polyethylene glycol (MIRALAX / GLYCOLAX) 17 g packet Take 17 g by mouth daily. 14 each 0   potassium chloride (KLOR-CON) 10 MEQ tablet Take 40 meq(4 tab) on Monday, Wednesday and Friday, all other days take 20 meq(2 tab) by mouth daily, Take additional 69mq(2tab) today, and 663m(6tab) x3days then back to regular dosing 360 tablet 3   pravastatin (  PRAVACHOL) 10 MG tablet Take 10 mg by mouth daily.     sucralfate (CARAFATE) 1 GM/10ML suspension Take 10 mLs (1 g total) by mouth 4 (four) times daily -  with meals and at bedtime. 420 mL 0   torsemide (DEMADEX) 10 MG tablet Take 10 mg by mouth daily. Pt takes 10 mg on  TUE, THUR, SAT, SUNDAY, AND 20 MG ON MON. WED AND FRIDAY     vitamin C (ASCORBIC ACID) 500 MG tablet Take 500 mg by mouth daily.     No current facility-administered medications for this visit.    Allergies   Allergies as of 11/09/2021 - Review Complete 11/05/2021  Allergen Reaction Noted   Flexeril [cyclobenzaprine] Anaphylaxis 01/12/2012   Other Anaphylaxis and Other (See Comments) 02/08/2011   Keflex [cephalexin] Itching 05/26/2012   Aspirin Other (See Comments) 05/05/2015   Fentanyl Other (See Comments) 11/09/2020   Hydrocodone  03/05/2021   Adhesive [tape] Rash  11/01/2014   Chlorhexidine Rash 11/19/2014     Past Medical History   Past Medical History:  Diagnosis Date   Anxiety    Asthma    Back pain, chronic    CKD (chronic kidney disease) stage 3, GFR 30-59 ml/min (HCC)    Compression fx, thoracic spine ([place])    T - 11   COPD (chronic obstructive pulmonary disease) ([place]) 12/16/2015   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[place])    Diverticulitis    Essential hypertension    GERD (gastroesophageal reflux disease)    HOH (hard of hearing)    Inflammatory arthritis    Iron deficiency anemia 01/2012   Pulmonary eosinophilia ([place])    Spinal stenosis    Tubular adenoma 11/2012    Past Surgical History   Past Surgical History:  Procedure Laterality Date   ABDOMINAL HYSTERECTOMY     BACK SURGERY     CARPAL TUNNEL RELEASE Right 06/2012   CATARACT EXTRACTION W/PHACO Left 11/19/2014   Procedure: CATARACT EXTRACTION PHACO AND INTRAOCULAR LENS PLACEMENT ([place]);  Surgeon: Williams Che, MD;  Location: [facility];  Service: Ophthalmology;  Laterality: Left;  CDE: 4.77   CATARACT EXTRACTION W/PHACO Right 02/03/2015   Procedure: CATARACT EXTRACTION PHACO AND INTRAOCULAR LENS PLACEMENT ([facility]);  Surgeon: Williams Che, MD;  Location: [facility];  Service: Ophthalmology;  Laterality: Right;  CDE: 4.54   COLONOSCOPY  06/19/2008   RMR: tortuous and elongated colon with scattered left-sided diverticula/colonic mucosa appeared entirely normal. Prior colonic ulcers had resolved.   COLONOSCOPY  12/2007   Dr. Gala Romney: Scattered diffuse sigmoid diverticula, 2 areas of ulceration at the hepatic flexure. Biopsies unremarkable.   COLONOSCOPY N/A 11/20/2012   next TCS 11/2017   COLONOSCOPY WITH PROPOFOL N/A 12/22/2017   Procedure: COLONOSCOPY WITH PROPOFOL;  Surgeon: Daneil Dolin, MD; diverticula in the sigmoid and descending colon and 2 tubular adenomas.  No recommendations to repeat.     DECOMPRESSIVE LUMBAR LAMINECTOMY LEVEL 2  04/20/2012   Procedure: DECOMPRESSIVE LUMBAR  LAMINECTOMY LEVEL 2;  Surgeon: Tobi Bastos, MD;  Location: [facility];  Service: Orthopedics;  Laterality: Right;  Decompressive Lumbar Laminectomy of the L4 - L5 and L5 - S1 Complete/Laminectomy L5 on the Right (X-Ray)   ESOPHAGOGASTRODUODENOSCOPY  12/2007   Dr. 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 Small hiatal hernia. Slightly pale duodenal mucosa (biopsy unremarkable)   ESOPHAGOGASTRODUODENOSCOPY (EGD) WITH PROPOFOL N/A 01/20/2017   Dr. Gala Romney: Medium sized hiatal hernia empiric esophageal dilation for history of dysphagia   ESOPHAGOGASTRODUODENOSCOPY (  EGD) WITH PROPOFOL N/A 04/08/2020   Surgeon: Eloise Harman, DO;  medium size hiatal hernia 3 to 4 cm chicken bone lodged in the gastric antrum just proximal to the pylorus removed with a snare with mild bleeding at the site of lodged bone.  Normal examined duodenum.   EYE SURGERY     BIL CATARACTS   FOREIGN BODY REMOVAL N/A 04/08/2020   Procedure: FOREIGN BODY REMOVAL;  Surgeon: Eloise Harman, DO;  [facility];  Service: Endoscopy;  Laterality: N/A;   INCISIONAL HERNIA REPAIR N/A 11/12/2020   Procedure: HERNIA REPAIR INCISIONAL;  Surgeon: Aviva Signs, MD;  Location: [facility];  Service: General;  Laterality: N/A;   IR KYPHO THORACIC WITH BONE BIOPSY  02/08/2017   IR RADIOLOGIST EVAL & MGMT  02/02/2017   JOINT REPLACEMENT     KNEE ARTHROSCOPY Right    MALONEY DILATION N/A 01/20/2017   Procedure: MALONEY DILATION;  Surgeon: Daneil Dolin, MD;  [facility];  Service: Endoscopy;  Laterality: N/A;   POLYPECTOMY  12/22/2017   Procedure: POLYPECTOMY;  Surgeon: Daneil Dolin, MD;  [facility];  Service: Endoscopy;;   REVERSE SHOULDER ARTHROPLASTY Right 05/24/2014   Procedure: RIGHT SHOULDER REVERSE ARTHROPLASTY;  Surgeon: Netta Cedars, MD;  Location: [place];  Service: Orthopedics;  Laterality: Right;   REVERSE SHOULDER ARTHROPLASTY Left 06/10/2017    Procedure: LEFT REVERSE SHOULDER ARTHROPLASTY;  Surgeon: Netta Cedars, MD;  Location: [place];  Service: Orthopedics;  Laterality: Left;   TOTAL KNEE ARTHROPLASTY  01/24/2012   Procedure: TOTAL KNEE ARTHROPLASTY;  Surgeon: Gearlean Alf, MD;  Location: [facility];  Service: Orthopedics;  Laterality: Right;   TOTAL KNEE ARTHROPLASTY Left 10/15/2019   Procedure: TOTAL KNEE ARTHROPLASTY;  Surgeon: Gaynelle Arabian, MD;  Location: [facility];  Service: Orthopedics;  Laterality: Left;  35mn   TUBAL LIGATION      Past Family History   Family History  Problem Relation Age of Onset   Breast cancer Sister    Colon cancer Sister        two deceased, one living and terminal, one current undergoing treatment, ages [phone]  Hypertension Mother    Stroke Mother    Heart attack Mother    Hypertension Father    Prostate cancer Father     Past Social History   Social History   Socioeconomic History   Marital status: Single    Spouse name: Not on file   Number of children: 3   Years of education: Not on file   Highest education level: Not on file  Occupational History   Not on file  Tobacco Use   Smoking status: Former    Packs/day: 2.00    Years: 40.00    Total pack years: 80.00    Types: Cigarettes    Quit date: 01/19/1992    Years since quitting: 29.8   Smokeless tobacco: Never  Vaping Use   Vaping Use: Never used  Substance and Sexual Activity   Alcohol use: No   Drug use: No   Sexual activity: Never  Other Topics Concern   Not on file  Social History Narrative   Not on file   Social Determinants of Health   Financial Resource Strain: Not on file  Food Insecurity: Not on file  Transportation Needs: Not on file  Physical Activity: Not on file  Stress: Not on file  Social Connections: Not on file  Intimate Partner Violence: Not  on file    Review of Systems   General: Negative for anorexia, weight loss, fever, chills, fatigue, weakness. ENT: Negative for hoarseness,  difficulty swallowing , nasal congestion. CV: Negative for chest pain, angina, palpitations, dyspnea on exertion, peripheral edema.  Respiratory: Negative for dyspnea at rest, dyspnea on exertion, cough, sputum, wheezing.  GI: See history of present illness. GU:  Negative for dysuria, hematuria, urinary incontinence, urinary frequency, nocturnal urination.  Endo: Negative for unusual weight change.     Physical Exam   There were no vitals taken for this visit.   General: Well-nourished, well-developed in no acute distress.  Eyes: No icterus. Mouth: Oropharyngeal mucosa moist and pink , no lesions erythema or exudate. Lungs: Clear to auscultation bilaterally.  Heart: Regular rate and rhythm, no murmurs rubs or gallops.  Abdomen: Bowel sounds are normal, nontender, nondistended, no hepatosplenomegaly or masses,  no abdominal bruits or hernia , no rebound or guarding.  Rectal: ***  Extremities: No lower extremity edema. No clubbing or deformities. Neuro: Alert and oriented x 4   Skin: Warm and dry, no jaundice.   Psych: Alert and cooperative, normal mood and affect.  Labs   *** Imaging Studies   CT ABDOMEN PELVIS W CONTRAST  Result Date: 11/05/2021 CLINICAL DATA:  Left lower quadrant abdominal pain. EXAM: CT ABDOMEN AND PELVIS WITH CONTRAST TECHNIQUE: Multidetector CT imaging of the abdomen and pelvis was performed using the standard protocol following bolus administration of intravenous contrast. RADIATION DOSE REDUCTION: This exam was performed according to the departmental dose-optimization program which includes automated exposure control, adjustment of the mA and/or kV according to patient size and/or use of iterative reconstruction technique. CONTRAST:  60m OMNIPAQUE IOHEXOL 300 MG/ML  SOLN COMPARISON:  February 21, 2021 FINDINGS: Lower chest: No acute abnormality. Hepatobiliary: No focal liver abnormality is seen. The gallbladder is mildly distended, without evidence of  gallstones, gallbladder wall thickening or pericholecystic inflammatory fat stranding. The common bile duct measures 7.5 mm in diameter. Pancreas: In ill-defined 1.4 cm x 1.0 cm x 0.8 cm focus of parenchymal low attenuation is seen within the head of the pancreas (axial CT images 22 through 24, CT series 2). No pancreatic ductal dilatation or surrounding inflammatory changes. Spleen: Normal in size without focal abnormality. Adrenals/Urinary Tract: Adrenal glands are unremarkable. Kidneys are normal, without renal calculi, focal lesion, or hydronephrosis. The urinary bladder is poorly distended and subsequently limited in evaluation. Stomach/Bowel: There is a stable moderate-sized hiatal hernia. Appendix appears normal. No evidence of bowel wall thickening, distention, or inflammatory changes. Noninflamed diverticula are seen throughout the sigmoid colon. Vascular/Lymphatic: Aortic atherosclerosis. No enlarged abdominal or pelvic lymph nodes. Reproductive: Status post hysterectomy. No adnexal masses. Other: No abdominal wall hernia or abnormality. No abdominopelvic ascites. Musculoskeletal: Multilevel degenerative changes are seen throughout the lumbar spine. IMPRESSION: 1. Small area of parenchymal low attenuation within the head of the pancreas which may represent a pancreatic cyst. Further evaluation with pancreatic protocol MRI is recommended. 2. Stable moderate-sized hiatal hernia. 3. Sigmoid diverticulosis. 4. Aortic atherosclerosis. Aortic Atherosclerosis (ICD10-I70.0). Electronically Signed   By: TVirgina NorfolkM.D.   On: 11/05/2021 20:20    Assessment       PLAN   ***   LLaureen Ochs LBobby Rumpf [facility]au[facility]

## 2021-11-09 ENCOUNTER — Other Ambulatory Visit (HOSPITAL_COMMUNITY)
Admission: RE | Admit: 2021-11-09 | Discharge: 2021-11-09 | Disposition: A | Discharge status: Home / Self Care | Payer: PPO | Source: Ambulatory Visit | Attending: Gastroenterology | Admitting: Gastroenterology

## 2021-11-09 ENCOUNTER — Ambulatory Visit (HOSPITAL_COMMUNITY)
Admission: RE | Admit: 2021-11-09 | Discharge: 2021-11-09 | Disposition: A | Discharge status: Home / Self Care | Payer: PPO | Source: Ambulatory Visit | Attending: Gastroenterology | Admitting: Gastroenterology

## 2021-11-09 ENCOUNTER — Ambulatory Visit (INDEPENDENT_AMBULATORY_CARE_PROVIDER_SITE_OTHER): Payer: PPO | Admitting: Gastroenterology

## 2021-11-09 ENCOUNTER — Encounter: Payer: Self-pay | Admitting: Gastroenterology

## 2021-11-09 VITALS — BP 132/83 | HR 99 | Temp 98.4°F | Ht 60.0 in | Wt 186.8 lb

## 2021-11-09 DIAGNOSIS — R1013 Epigastric pain: Secondary | ICD-10-CM | POA: Insufficient documentation

## 2021-11-09 DIAGNOSIS — K5909 Other constipation: Secondary | ICD-10-CM | POA: Diagnosis not present

## 2021-11-09 DIAGNOSIS — R109 Unspecified abdominal pain: Secondary | ICD-10-CM | POA: Diagnosis not present

## 2021-11-09 DIAGNOSIS — K219 Gastro-esophageal reflux disease without esophagitis: Secondary | ICD-10-CM | POA: Diagnosis not present

## 2021-11-09 DIAGNOSIS — R110 Nausea: Secondary | ICD-10-CM | POA: Diagnosis not present

## 2021-11-09 LAB — COMPREHENSIVE METABOLIC PANEL
ALT: 15 U/L (ref 0–44)
AST: 23 U/L (ref 15–41)
Albumin: 4 g/dL (ref 3.5–5.0)
Alkaline Phosphatase: 44 U/L (ref 38–126)
Anion gap: 11 (ref 5–15)
BUN: 10 mg/dL (ref 8–23)
CO2: 27 mmol/L (ref 22–32)
Calcium: 8.1 mg/dL — ABNORMAL LOW (ref 8.9–10.3)
Chloride: 97 mmol/L — ABNORMAL LOW (ref 98–111)
Creatinine, Ser: 1.29 mg/dL — ABNORMAL HIGH (ref 0.44–1.00)
GFR, Estimated: 42 mL/min — ABNORMAL LOW (ref >60)
Glucose, Bld: 114 mg/dL — ABNORMAL HIGH (ref 70–99)
Potassium: 3.7 mmol/L (ref 3.5–5.1)
Sodium: 135 mmol/L (ref 135–145)
Total Bilirubin: 0.7 mg/dL (ref 0.3–1.2)
Total Protein: 7.5 g/dL (ref 6.5–8.1)

## 2021-11-09 LAB — CBC WITH DIFFERENTIAL/PLATELET
Abs Immature Granulocytes: 0.02 10*3/uL (ref 0.00–0.07)
Basophils Absolute: 0 10*3/uL (ref 0.0–0.1)
Basophils Relative: 0 %
Eosinophils Absolute: 0 10*3/uL (ref 0.0–0.5)
Eosinophils Relative: 0 %
HCT: 42.3 % (ref 36.0–46.0)
Hemoglobin: 13.2 g/dL (ref 12.0–15.0)
Immature Granulocytes: 0 %
Lymphocytes Relative: 30 %
Lymphs Abs: 2.4 10*3/uL (ref 0.7–4.0)
MCH: 25.7 pg — ABNORMAL LOW (ref 26.0–34.0)
MCHC: 31.2 g/dL (ref 30.0–36.0)
MCV: 82.5 fL (ref 80.0–100.0)
Monocytes Absolute: 0.7 10*3/uL (ref 0.1–1.0)
Monocytes Relative: 9 %
Neutro Abs: 4.9 10*3/uL (ref 1.7–7.7)
Neutrophils Relative %: 61 %
Platelets: 297 10*3/uL (ref 150–400)
RBC: 5.13 MIL/uL — ABNORMAL HIGH (ref 3.87–5.11)
RDW: 14 % (ref 11.5–15.5)
WBC: 7.9 10*3/uL (ref 4.0–10.5)
nRBC: 0 % (ref 0.0–0.2)

## 2021-11-09 LAB — LIPASE, BLOOD: Lipase: 26 U/L (ref 11–51)

## 2021-11-09 MED ORDER — TRULANCE 3 MG PO TABS: 3.0000 mg | ORAL_TABLET | Freq: Every day | ORAL | 1 refills | Status: DC

## 2021-11-09 MED ORDER — PROMETHAZINE HCL 12.5 MG PO TABS: 6.2500 mg | ORAL_TABLET | Freq: Three times a day (TID) | ORAL | 0 refills | Status: DC | PRN

## 2021-11-09 NOTE — Patient Instructions (Addendum)
Please continue Nexium '40mg'$  twice daily for acid reflux. Use Carafate or Maalox as prescribed.  Trial of Trulance '3mg'$  daily for constipation. Samples and prescription sent to pharmacy. If you prefer you can use sennakot (over the counter) per package instructions instead of Trulance. Low dose phenergan sent to pharmacy for nausea. Can be sedating.  Abdominal ultrasound and labs as planned to rule out gallbladder issues and dehydration.

## 2021-11-11 DIAGNOSIS — E1122 Type 2 diabetes mellitus with diabetic chronic kidney disease: Secondary | ICD-10-CM | POA: Diagnosis not present

## 2021-11-11 DIAGNOSIS — I129 Hypertensive chronic kidney disease with stage 1 through stage 4 chronic kidney disease, or unspecified chronic kidney disease: Secondary | ICD-10-CM | POA: Diagnosis not present

## 2021-11-11 DIAGNOSIS — E782 Mixed hyperlipidemia: Secondary | ICD-10-CM | POA: Diagnosis not present

## 2021-11-11 DIAGNOSIS — N1832 Chronic kidney disease, stage 3b: Secondary | ICD-10-CM | POA: Diagnosis not present

## 2021-11-12 DIAGNOSIS — R110 Nausea: Secondary | ICD-10-CM | POA: Diagnosis not present

## 2021-11-17 ENCOUNTER — Telehealth: Payer: Self-pay | Admitting: *Deleted

## 2021-11-17 NOTE — Telephone Encounter (Signed)
LMTRC in regards to MRI being scheduled

## 2021-11-18 ENCOUNTER — Other Ambulatory Visit: Payer: Self-pay | Admitting: *Deleted

## 2021-11-18 DIAGNOSIS — R9389 Abnormal findings on diagnostic imaging of other specified body structures: Secondary | ICD-10-CM

## 2021-11-18 NOTE — Telephone Encounter (Signed)
MRI scheduled for 12/11/21 at 11:00 am at [facility]. Pt needs to arrive at 10:45 am, nothing to eat or drink 4 hours prior to procedure. Sent pt's daughter [name] a message on MyChart.

## 2021-11-18 NOTE — Telephone Encounter (Signed)
Pt's daughter called wanting to know if the MRI was ordered. Do you want an MRI ordered on this pt?  Please advise. Thank you

## 2021-11-18 NOTE — Telephone Encounter (Signed)
See result note, I have put orders there. Thanks.

## 2021-12-01 DIAGNOSIS — J301 Allergic rhinitis due to pollen: Secondary | ICD-10-CM | POA: Diagnosis not present

## 2021-12-01 DIAGNOSIS — K219 Gastro-esophageal reflux disease without esophagitis: Secondary | ICD-10-CM | POA: Diagnosis not present

## 2021-12-01 DIAGNOSIS — R110 Nausea: Secondary | ICD-10-CM | POA: Diagnosis not present

## 2021-12-01 DIAGNOSIS — Z87891 Personal history of nicotine dependence: Secondary | ICD-10-CM | POA: Diagnosis not present

## 2021-12-01 DIAGNOSIS — J4550 Severe persistent asthma, uncomplicated: Secondary | ICD-10-CM | POA: Diagnosis not present

## 2021-12-01 DIAGNOSIS — R935 Abnormal findings on diagnostic imaging of other abdominal regions, including retroperitoneum: Secondary | ICD-10-CM | POA: Diagnosis not present

## 2021-12-01 DIAGNOSIS — D7219 Other eosinophilia: Secondary | ICD-10-CM | POA: Diagnosis not present

## 2021-12-04 ENCOUNTER — Ambulatory Visit (HOSPITAL_COMMUNITY)
Admission: RE | Admit: 2021-12-04 | Discharge: 2021-12-04 | Disposition: A | Discharge status: Home / Self Care | Payer: PPO | Source: Ambulatory Visit | Attending: Gastroenterology | Admitting: Gastroenterology

## 2021-12-04 ENCOUNTER — Other Ambulatory Visit: Payer: Self-pay | Admitting: Gastroenterology

## 2021-12-04 DIAGNOSIS — K449 Diaphragmatic hernia without obstruction or gangrene: Secondary | ICD-10-CM | POA: Diagnosis not present

## 2021-12-04 DIAGNOSIS — R9389 Abnormal findings on diagnostic imaging of other specified body structures: Secondary | ICD-10-CM

## 2021-12-04 DIAGNOSIS — K5730 Diverticulosis of large intestine without perforation or abscess without bleeding: Secondary | ICD-10-CM | POA: Diagnosis not present

## 2021-12-04 MED ORDER — GADOBUTROL 1 MMOL/ML IV SOLN
8.0000 mL | Freq: Once | INTRAVENOUS | Status: AC | PRN
Start: 2021-12-04 — End: 2021-12-04
  Administered 2021-12-04: 8 mL via INTRAVENOUS

## 2021-12-07 ENCOUNTER — Telehealth: Payer: Self-pay

## 2021-12-07 NOTE — Telephone Encounter (Signed)
Pt is calling wanting to know the results to her recent MRI. Please advise.

## 2021-12-07 NOTE — Telephone Encounter (Signed)
See result note.  

## 2021-12-08 ENCOUNTER — Ambulatory Visit: Payer: PPO | Admitting: Internal Medicine

## 2021-12-11 ENCOUNTER — Other Ambulatory Visit (HOSPITAL_COMMUNITY): Payer: PPO

## 2021-12-21 DIAGNOSIS — S81812D Laceration without foreign body, left lower leg, subsequent encounter: Secondary | ICD-10-CM | POA: Diagnosis not present

## 2021-12-22 ENCOUNTER — Ambulatory Visit: Payer: PPO | Admitting: Internal Medicine

## 2021-12-22 DIAGNOSIS — S81802A Unspecified open wound, left lower leg, initial encounter: Secondary | ICD-10-CM | POA: Diagnosis not present

## 2021-12-23 DIAGNOSIS — S81802D Unspecified open wound, left lower leg, subsequent encounter: Secondary | ICD-10-CM | POA: Diagnosis not present

## 2021-12-25 DIAGNOSIS — H02403 Unspecified ptosis of bilateral eyelids: Secondary | ICD-10-CM | POA: Diagnosis not present

## 2021-12-25 DIAGNOSIS — H02834 Dermatochalasis of left upper eyelid: Secondary | ICD-10-CM | POA: Diagnosis not present

## 2021-12-25 DIAGNOSIS — H5213 Myopia, bilateral: Secondary | ICD-10-CM | POA: Diagnosis not present

## 2021-12-25 DIAGNOSIS — H26493 Other secondary cataract, bilateral: Secondary | ICD-10-CM | POA: Diagnosis not present

## 2021-12-25 DIAGNOSIS — H43813 Vitreous degeneration, bilateral: Secondary | ICD-10-CM | POA: Diagnosis not present

## 2021-12-25 DIAGNOSIS — H02831 Dermatochalasis of right upper eyelid: Secondary | ICD-10-CM | POA: Diagnosis not present

## 2021-12-25 DIAGNOSIS — H524 Presbyopia: Secondary | ICD-10-CM | POA: Diagnosis not present

## 2021-12-28 DIAGNOSIS — S81801D Unspecified open wound, right lower leg, subsequent encounter: Secondary | ICD-10-CM | POA: Diagnosis not present

## 2021-12-29 DIAGNOSIS — N189 Chronic kidney disease, unspecified: Secondary | ICD-10-CM | POA: Diagnosis not present

## 2021-12-29 DIAGNOSIS — E11622 Type 2 diabetes mellitus with other skin ulcer: Secondary | ICD-10-CM | POA: Diagnosis not present

## 2021-12-29 DIAGNOSIS — Z885 Allergy status to narcotic agent status: Secondary | ICD-10-CM | POA: Diagnosis not present

## 2021-12-29 DIAGNOSIS — E1122 Type 2 diabetes mellitus with diabetic chronic kidney disease: Secondary | ICD-10-CM | POA: Diagnosis not present

## 2021-12-29 DIAGNOSIS — K219 Gastro-esophageal reflux disease without esophagitis: Secondary | ICD-10-CM | POA: Diagnosis not present

## 2021-12-29 DIAGNOSIS — Z7722 Contact with and (suspected) exposure to environmental tobacco smoke (acute) (chronic): Secondary | ICD-10-CM | POA: Diagnosis not present

## 2021-12-29 DIAGNOSIS — Z792 Long term (current) use of antibiotics: Secondary | ICD-10-CM | POA: Diagnosis not present

## 2021-12-29 DIAGNOSIS — S81811A Laceration without foreign body, right lower leg, initial encounter: Secondary | ICD-10-CM | POA: Diagnosis not present

## 2021-12-29 DIAGNOSIS — I878 Other specified disorders of veins: Secondary | ICD-10-CM | POA: Diagnosis not present

## 2021-12-29 DIAGNOSIS — T148XXA Other injury of unspecified body region, initial encounter: Secondary | ICD-10-CM | POA: Diagnosis not present

## 2021-12-29 DIAGNOSIS — I129 Hypertensive chronic kidney disease with stage 1 through stage 4 chronic kidney disease, or unspecified chronic kidney disease: Secondary | ICD-10-CM | POA: Diagnosis not present

## 2021-12-29 DIAGNOSIS — M79661 Pain in right lower leg: Secondary | ICD-10-CM | POA: Diagnosis not present

## 2021-12-29 DIAGNOSIS — E785 Hyperlipidemia, unspecified: Secondary | ICD-10-CM | POA: Diagnosis not present

## 2021-12-29 DIAGNOSIS — J449 Chronic obstructive pulmonary disease, unspecified: Secondary | ICD-10-CM | POA: Diagnosis not present

## 2021-12-29 DIAGNOSIS — L97919 Non-pressure chronic ulcer of unspecified part of right lower leg with unspecified severity: Secondary | ICD-10-CM | POA: Diagnosis not present

## 2021-12-29 DIAGNOSIS — I890 Lymphedema, not elsewhere classified: Secondary | ICD-10-CM | POA: Diagnosis not present

## 2021-12-29 DIAGNOSIS — M79662 Pain in left lower leg: Secondary | ICD-10-CM | POA: Diagnosis not present

## 2021-12-29 DIAGNOSIS — Z87891 Personal history of nicotine dependence: Secondary | ICD-10-CM | POA: Diagnosis not present

## 2021-12-29 DIAGNOSIS — Z886 Allergy status to analgesic agent status: Secondary | ICD-10-CM | POA: Diagnosis not present

## 2021-12-29 DIAGNOSIS — L97912 Non-pressure chronic ulcer of unspecified part of right lower leg with fat layer exposed: Secondary | ICD-10-CM | POA: Diagnosis not present

## 2021-12-29 DIAGNOSIS — M797 Fibromyalgia: Secondary | ICD-10-CM | POA: Diagnosis not present

## 2021-12-29 DIAGNOSIS — Z888 Allergy status to other drugs, medicaments and biological substances status: Secondary | ICD-10-CM | POA: Diagnosis not present

## 2021-12-29 DIAGNOSIS — M7989 Other specified soft tissue disorders: Secondary | ICD-10-CM | POA: Diagnosis not present

## 2021-12-30 ENCOUNTER — Ambulatory Visit: Payer: PPO | Admitting: Internal Medicine

## 2021-12-31 DIAGNOSIS — Z6837 Body mass index (BMI) 37.0-37.9, adult: Secondary | ICD-10-CM | POA: Diagnosis not present

## 2021-12-31 DIAGNOSIS — N183 Chronic kidney disease, stage 3 unspecified: Secondary | ICD-10-CM | POA: Diagnosis not present

## 2021-12-31 DIAGNOSIS — S81801D Unspecified open wound, right lower leg, subsequent encounter: Secondary | ICD-10-CM | POA: Diagnosis not present

## 2021-12-31 DIAGNOSIS — Z7951 Long term (current) use of inhaled steroids: Secondary | ICD-10-CM | POA: Diagnosis not present

## 2021-12-31 DIAGNOSIS — J449 Chronic obstructive pulmonary disease, unspecified: Secondary | ICD-10-CM | POA: Diagnosis not present

## 2022-01-01 DIAGNOSIS — R252 Cramp and spasm: Secondary | ICD-10-CM | POA: Diagnosis not present

## 2022-01-01 DIAGNOSIS — T148XXA Other injury of unspecified body region, initial encounter: Secondary | ICD-10-CM | POA: Diagnosis not present

## 2022-01-01 DIAGNOSIS — E1122 Type 2 diabetes mellitus with diabetic chronic kidney disease: Secondary | ICD-10-CM | POA: Diagnosis not present

## 2022-01-04 ENCOUNTER — Other Ambulatory Visit: Payer: Self-pay

## 2022-01-04 DIAGNOSIS — T148XXA Other injury of unspecified body region, initial encounter: Secondary | ICD-10-CM | POA: Diagnosis not present

## 2022-01-04 DIAGNOSIS — L97918 Non-pressure chronic ulcer of unspecified part of right lower leg with other specified severity: Secondary | ICD-10-CM | POA: Diagnosis not present

## 2022-01-05 DIAGNOSIS — E875 Hyperkalemia: Secondary | ICD-10-CM | POA: Diagnosis not present

## 2022-01-05 DIAGNOSIS — J449 Chronic obstructive pulmonary disease, unspecified: Secondary | ICD-10-CM | POA: Diagnosis not present

## 2022-01-05 DIAGNOSIS — I10 Essential (primary) hypertension: Secondary | ICD-10-CM | POA: Diagnosis not present

## 2022-01-05 DIAGNOSIS — M1990 Unspecified osteoarthritis, unspecified site: Secondary | ICD-10-CM | POA: Diagnosis not present

## 2022-01-05 DIAGNOSIS — D509 Iron deficiency anemia, unspecified: Secondary | ICD-10-CM | POA: Diagnosis not present

## 2022-01-05 DIAGNOSIS — F411 Generalized anxiety disorder: Secondary | ICD-10-CM | POA: Diagnosis not present

## 2022-01-05 DIAGNOSIS — E1122 Type 2 diabetes mellitus with diabetic chronic kidney disease: Secondary | ICD-10-CM | POA: Diagnosis not present

## 2022-01-05 DIAGNOSIS — I5032 Chronic diastolic (congestive) heart failure: Secondary | ICD-10-CM | POA: Diagnosis not present

## 2022-01-05 DIAGNOSIS — Z87311 Personal history of (healed) other pathological fracture: Secondary | ICD-10-CM | POA: Diagnosis not present

## 2022-01-05 DIAGNOSIS — E782 Mixed hyperlipidemia: Secondary | ICD-10-CM | POA: Diagnosis not present

## 2022-01-07 DIAGNOSIS — Z79899 Other long term (current) drug therapy: Secondary | ICD-10-CM | POA: Diagnosis not present

## 2022-01-07 DIAGNOSIS — M5136 Other intervertebral disc degeneration, lumbar region: Secondary | ICD-10-CM | POA: Diagnosis not present

## 2022-01-07 DIAGNOSIS — M48061 Spinal stenosis, lumbar region without neurogenic claudication: Secondary | ICD-10-CM | POA: Diagnosis not present

## 2022-01-07 DIAGNOSIS — G894 Chronic pain syndrome: Secondary | ICD-10-CM | POA: Diagnosis not present

## 2022-01-07 DIAGNOSIS — Z5181 Encounter for therapeutic drug level monitoring: Secondary | ICD-10-CM | POA: Diagnosis not present

## 2022-01-07 DIAGNOSIS — M961 Postlaminectomy syndrome, not elsewhere classified: Secondary | ICD-10-CM | POA: Diagnosis not present

## 2022-01-08 DIAGNOSIS — I890 Lymphedema, not elsewhere classified: Secondary | ICD-10-CM | POA: Diagnosis not present

## 2022-01-08 DIAGNOSIS — L97912 Non-pressure chronic ulcer of unspecified part of right lower leg with fat layer exposed: Secondary | ICD-10-CM | POA: Diagnosis not present

## 2022-01-12 ENCOUNTER — Ambulatory Visit: Payer: PPO | Admitting: Internal Medicine

## 2022-01-19 DIAGNOSIS — Z6836 Body mass index (BMI) 36.0-36.9, adult: Secondary | ICD-10-CM | POA: Diagnosis not present

## 2022-01-19 DIAGNOSIS — I10 Essential (primary) hypertension: Secondary | ICD-10-CM | POA: Diagnosis not present

## 2022-01-19 DIAGNOSIS — N183 Chronic kidney disease, stage 3 unspecified: Secondary | ICD-10-CM | POA: Diagnosis not present

## 2022-01-19 DIAGNOSIS — E1159 Type 2 diabetes mellitus with other circulatory complications: Secondary | ICD-10-CM | POA: Diagnosis not present

## 2022-01-19 DIAGNOSIS — E1122 Type 2 diabetes mellitus with diabetic chronic kidney disease: Secondary | ICD-10-CM | POA: Diagnosis not present

## 2022-01-19 DIAGNOSIS — L97912 Non-pressure chronic ulcer of unspecified part of right lower leg with fat layer exposed: Secondary | ICD-10-CM | POA: Diagnosis not present

## 2022-01-19 DIAGNOSIS — W228XXD Striking against or struck by other objects, subsequent encounter: Secondary | ICD-10-CM | POA: Diagnosis not present

## 2022-01-19 DIAGNOSIS — S81801D Unspecified open wound, right lower leg, subsequent encounter: Secondary | ICD-10-CM | POA: Diagnosis not present

## 2022-01-19 DIAGNOSIS — Z515 Encounter for palliative care: Secondary | ICD-10-CM | POA: Diagnosis not present

## 2022-01-21 DIAGNOSIS — M79641 Pain in right hand: Secondary | ICD-10-CM | POA: Diagnosis not present

## 2022-01-21 DIAGNOSIS — M79642 Pain in left hand: Secondary | ICD-10-CM | POA: Diagnosis not present

## 2022-01-29 DIAGNOSIS — J4550 Severe persistent asthma, uncomplicated: Secondary | ICD-10-CM | POA: Diagnosis not present

## 2022-01-29 DIAGNOSIS — D7219 Other eosinophilia: Secondary | ICD-10-CM | POA: Diagnosis not present

## 2022-02-01 DIAGNOSIS — I129 Hypertensive chronic kidney disease with stage 1 through stage 4 chronic kidney disease, or unspecified chronic kidney disease: Secondary | ICD-10-CM | POA: Diagnosis not present

## 2022-02-01 DIAGNOSIS — I878 Other specified disorders of veins: Secondary | ICD-10-CM | POA: Diagnosis not present

## 2022-02-01 DIAGNOSIS — Z885 Allergy status to narcotic agent status: Secondary | ICD-10-CM | POA: Diagnosis not present

## 2022-02-01 DIAGNOSIS — M1990 Unspecified osteoarthritis, unspecified site: Secondary | ICD-10-CM | POA: Diagnosis not present

## 2022-02-01 DIAGNOSIS — E669 Obesity, unspecified: Secondary | ICD-10-CM | POA: Diagnosis not present

## 2022-02-01 DIAGNOSIS — I890 Lymphedema, not elsewhere classified: Secondary | ICD-10-CM | POA: Diagnosis not present

## 2022-02-01 DIAGNOSIS — J449 Chronic obstructive pulmonary disease, unspecified: Secondary | ICD-10-CM | POA: Diagnosis not present

## 2022-02-01 DIAGNOSIS — F419 Anxiety disorder, unspecified: Secondary | ICD-10-CM | POA: Diagnosis not present

## 2022-02-01 DIAGNOSIS — Z87891 Personal history of nicotine dependence: Secondary | ICD-10-CM | POA: Diagnosis not present

## 2022-02-01 DIAGNOSIS — M797 Fibromyalgia: Secondary | ICD-10-CM | POA: Diagnosis not present

## 2022-02-01 DIAGNOSIS — M5030 Other cervical disc degeneration, unspecified cervical region: Secondary | ICD-10-CM | POA: Diagnosis not present

## 2022-02-01 DIAGNOSIS — Z886 Allergy status to analgesic agent status: Secondary | ICD-10-CM | POA: Diagnosis not present

## 2022-02-01 DIAGNOSIS — N183 Chronic kidney disease, stage 3 unspecified: Secondary | ICD-10-CM | POA: Diagnosis not present

## 2022-02-01 DIAGNOSIS — Z79899 Other long term (current) drug therapy: Secondary | ICD-10-CM | POA: Diagnosis not present

## 2022-02-01 DIAGNOSIS — E785 Hyperlipidemia, unspecified: Secondary | ICD-10-CM | POA: Diagnosis not present

## 2022-02-01 DIAGNOSIS — Z4800 Encounter for change or removal of nonsurgical wound dressing: Secondary | ICD-10-CM | POA: Diagnosis not present

## 2022-02-01 DIAGNOSIS — L97812 Non-pressure chronic ulcer of other part of right lower leg with fat layer exposed: Secondary | ICD-10-CM | POA: Diagnosis not present

## 2022-02-01 DIAGNOSIS — K219 Gastro-esophageal reflux disease without esophagitis: Secondary | ICD-10-CM | POA: Diagnosis not present

## 2022-02-01 DIAGNOSIS — Z981 Arthrodesis status: Secondary | ICD-10-CM | POA: Diagnosis not present

## 2022-02-02 ENCOUNTER — Ambulatory Visit (INDEPENDENT_AMBULATORY_CARE_PROVIDER_SITE_OTHER): Payer: PPO | Admitting: Internal Medicine

## 2022-02-02 ENCOUNTER — Telehealth: Payer: Self-pay

## 2022-02-02 ENCOUNTER — Encounter: Payer: Self-pay | Admitting: *Deleted

## 2022-02-02 ENCOUNTER — Other Ambulatory Visit: Payer: Self-pay

## 2022-02-02 ENCOUNTER — Encounter: Payer: Self-pay | Admitting: Internal Medicine

## 2022-02-02 VITALS — BP 129/79 | HR 109 | Temp 97.7°F | Ht 60.0 in | Wt 179.0 lb

## 2022-02-02 DIAGNOSIS — K5909 Other constipation: Secondary | ICD-10-CM | POA: Diagnosis not present

## 2022-02-02 DIAGNOSIS — R1319 Other dysphagia: Secondary | ICD-10-CM | POA: Diagnosis not present

## 2022-02-02 DIAGNOSIS — K219 Gastro-esophageal reflux disease without esophagitis: Secondary | ICD-10-CM | POA: Diagnosis not present

## 2022-02-02 MED ORDER — TRULANCE 3 MG PO TABS: 3.0000 mg | ORAL_TABLET | Freq: Every day | ORAL | 1 refills | Status: DC

## 2022-02-02 MED ORDER — PANTOPRAZOLE SODIUM 40 MG PO TBEC: 40.0000 mg | DELAYED_RELEASE_TABLET | Freq: Two times a day (BID) | ORAL | 11 refills | Status: DC

## 2022-02-02 NOTE — Telephone Encounter (Signed)
PA for Trulance 3 mg tablet was approved from 02/02/2022 through 03/15/2023. Approval letter to be scanned into patient's chart.

## 2022-02-02 NOTE — H&P (View-Only) (Signed)
Primary Care Physician:  Celene Squibb, MD Primary Gastroenterologist:  Dr. Gala Romney  Pre-Procedure History & Physical: HPI:  Glenda Terry is a 81 y.o. female here for follow-up GERD and constipation.  For some reason, Trulance never went through and she did not get the prescription.  Ongoing issues with constipation.  Nexium not controlling symptoms very well.  Now, complains of esophageal dysphagia, pointing to her suprasternal notch.  CT ultrasound MRI earlier this year confirmed patient has no pancreatic mass.  Probably a fat cushion in the area of the head of the pancreas.  Benign-appearing hemangioma also found in the liver.  Past Medical History:  Diagnosis Date   Anxiety    Asthma    Back pain, chronic    CKD (chronic kidney disease) stage 3, GFR 30-59 ml/min (HCC)    Compression fx, thoracic spine (HCC)    T - 11   COPD (chronic obstructive pulmonary disease) ([place]) 12/16/2015   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HCC)    Diverticulitis    Essential hypertension    GERD (gastroesophageal reflux disease)    HOH (hard of hearing)    Inflammatory arthritis    Iron deficiency anemia 01/2012   Pulmonary eosinophilia (HCC)    Spinal stenosis    Tubular adenoma 11/2012    Past Surgical History:  Procedure Laterality Date   ABDOMINAL HYSTERECTOMY     BACK SURGERY     CARPAL TUNNEL RELEASE Right 06/2012   CATARACT EXTRACTION W/PHACO Left 11/19/2014   Procedure: CATARACT EXTRACTION PHACO AND INTRAOCULAR LENS PLACEMENT ([place]);  Surgeon: Williams Che, MD;  Location: [facility];  Service: Ophthalmology;  Laterality: Left;  CDE: 4.77   CATARACT EXTRACTION W/PHACO Right 02/03/2015   Procedure: CATARACT EXTRACTION PHACO AND INTRAOCULAR LENS PLACEMENT ([facility]);  Surgeon: Williams Che, MD;  Location: [facility];  Service: Ophthalmology;  Laterality: Right;  CDE: 4.54   COLONOSCOPY  06/19/2008   RMR: tortuous and elongated colon with scattered left-sided diverticula/colonic mucosa appeared  entirely normal. Prior colonic ulcers had resolved.   COLONOSCOPY  12/2007   Dr. Gala Romney: Scattered diffuse sigmoid diverticula, 2 areas of ulceration at the hepatic flexure. Biopsies unremarkable.   COLONOSCOPY N/A 11/20/2012   next TCS 11/2017   COLONOSCOPY WITH PROPOFOL N/A 12/22/2017   Procedure: COLONOSCOPY WITH PROPOFOL;  Surgeon: Daneil Dolin, MD; diverticula in the sigmoid and descending colon and 2 tubular adenomas.  No recommendations to repeat.     DECOMPRESSIVE LUMBAR LAMINECTOMY LEVEL 2  04/20/2012   Procedure: DECOMPRESSIVE LUMBAR LAMINECTOMY LEVEL 2;  Surgeon: Tobi Bastos, MD;  Location: [facility];  Service: Orthopedics;  Laterality: Right;  Decompressive Lumbar Laminectomy of the L4 - L5 and L5 - S1 Complete/Laminectomy L5 on the Right (X-Ray)   ESOPHAGOGASTRODUODENOSCOPY  12/2007   Dr. 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 Small hiatal hernia. Slightly pale duodenal mucosa (biopsy unremarkable)   ESOPHAGOGASTRODUODENOSCOPY (EGD) WITH PROPOFOL N/A 01/20/2017   Dr. Gala Romney: Medium sized hiatal hernia empiric esophageal dilation for history of dysphagia   ESOPHAGOGASTRODUODENOSCOPY (EGD) WITH PROPOFOL N/A 04/08/2020   Surgeon: Eloise Harman, DO;  medium size hiatal hernia 3 to 4 cm chicken bone lodged in the gastric antrum just proximal to the pylorus removed with a snare with mild bleeding at the site of lodged bone.  Normal examined duodenum.   EYE SURGERY     BIL CATARACTS   FOREIGN BODY REMOVAL N/A 04/08/2020   Procedure: FOREIGN BODY REMOVAL;  Surgeon: Eloise Harman, DO;  [facility];  Service: Endoscopy;  Laterality: N/A;   INCISIONAL HERNIA REPAIR N/A 11/12/2020   Procedure: HERNIA REPAIR INCISIONAL;  Surgeon: Aviva Signs, MD;  Location: [facility];  Service: General;  Laterality: N/A;   IR KYPHO THORACIC WITH BONE BIOPSY  02/08/2017   IR RADIOLOGIST EVAL & MGMT  02/02/2017   JOINT REPLACEMENT     KNEE  ARTHROSCOPY Right    MALONEY DILATION N/A 01/20/2017   Procedure: MALONEY DILATION;  Surgeon: Daneil Dolin, MD;  [facility]: [facility];  Service: Endoscopy;  Laterality: N/A;   POLYPECTOMY  12/22/2017   Procedure: POLYPECTOMY;  Surgeon: Daneil Dolin, MD;  [facility];  Service: Endoscopy;;   REVERSE SHOULDER ARTHROPLASTY Right 05/24/2014   Procedure: RIGHT SHOULDER REVERSE ARTHROPLASTY;  Surgeon: Netta Cedars, MD;  Location: [facility];  Service: Orthopedics;  Laterality: Right;   REVERSE SHOULDER ARTHROPLASTY Left 06/10/2017   Procedure: LEFT REVERSE SHOULDER ARTHROPLASTY;  Surgeon: Netta Cedars, MD;  Location: [facility];  Service: Orthopedics;  Laterality: Left;   TOTAL KNEE ARTHROPLASTY  01/24/2012   Procedure: TOTAL KNEE ARTHROPLASTY;  Surgeon: Gearlean Alf, MD;  Location: [facility];  Service: Orthopedics;  Laterality: Right;   TOTAL KNEE ARTHROPLASTY Left 10/15/2019   Procedure: TOTAL KNEE ARTHROPLASTY;  Surgeon: Gaynelle Arabian, MD;  Location: [facility];  Service: Orthopedics;  Laterality: Left;  62mn   TUBAL LIGATION      Prior to Admission medications   Medication Sig Start Date End Date Taking? Authorizing Provider  Acetaminophen (APAP 500 PO) Take 2 tablets by mouth 2 (two) times daily as needed.   Yes [provider]  albuterol (PROAIR HFA) 108 (90 Base) MCG/ACT inhaler Inhale 2 puffs into the lungs every 6 (six) hours as needed for wheezing or shortness of breath (For COPD).    Yes [provider]  ALPRAZolam (Duanne Moron 0.5 MG tablet Take 0.5 tablets (0.25 mg total) by mouth 2 (two) times daily as needed for anxiety. 10/25/19  Yes GGerlene Fee NP  amLODipine (NORVASC) 5 MG tablet Take 1 tablet (5 mg total) by mouth daily. 03/27/21 02/02/22 Yes Hochrein, JJeneen Rinks MD  Benralizumab 30 MG/ML SOSY Inject 30 mg into the skin. Once A Day Every 56 Days 10/17/19  Yes [provider]  calcium carbonate (OS-CAL) 600 MG TABS tablet Take 600 mg by mouth 2 (two)  times daily with a meal.   Yes [provider]  carvedilol (COREG) 6.25 MG tablet Take 1 tablet (6.25 mg total) by mouth 2 (two) times daily. 05/08/21  Yes HMinus Breeding MD  Cholecalciferol (VITAMIN D3) 50 MCG (2000 UT) CAPS Take 1 capsule by mouth daily.   Yes [provider]  cyanocobalamin 2000 MCG tablet Take 1 tablet every day by oral route for 90 days. 09/25/21  Yes [provider]  denosumab (PROLIA) 60 MG/ML SOSY injection Inject 60 mg into the skin every 6 (six) months. 03/01/18  Yes [provider]  diphenhydrAMINE (BENADRYL) 25 mg capsule Take 25 mg by mouth at bedtime.   Yes [provider]  DULERA 200-5 MCG/ACT AERO Inhale 2 puffs into the lungs 2 (two) times daily. 10/25/19  Yes GGerlene Fee NP  esomeprazole (NEXIUM) 40 MG capsule Take 1 capsule (40 mg total) by mouth 2 (two) times daily. 10/25/19  Yes GGerlene Fee NP  ferrous sulfate 325 (65 FE) MG tablet Take 1 tablet (325 mg total) by mouth daily with  breakfast. 10/25/19  Yes Gerlene Fee, NP  fexofenadine (ALLEGRA) 180 MG tablet Take 180 mg by mouth daily.   Yes [provider]  fluticasone (FLONASE) 50 MCG/ACT nasal spray Place 1 spray into both nostrils daily as needed for allergies or rhinitis.  10/17/19  Yes [provider]  ipratropium-albuterol (DUONEB) 0.5-2.5 (3) MG/3ML SOLN Take 3 mLs by nebulization every 4 (four) hours as needed.   Yes [provider]  ketotifen (ZADITOR) 0.025 % ophthalmic solution Place 1 drop into both eyes 2 (two) times daily as needed (allergies). 10/25/19  Yes Gerlene Fee, NP  montelukast (SINGULAIR) 10 MG tablet Take 1 tablet (10 mg total) by mouth at bedtime. 07/25/20  Yes Elouise Munroe, MD  Oxycodone HCl 10 MG TABS Take 10 mg by mouth every 6 (six) hours. 12/29/19  Yes [provider]  Polyethyl Glycol-Propyl Glycol (SYSTANE OP) Place 1 drop into both eyes 3 (three) times daily as needed  (dry/irritated eyes.).    Yes [provider]  polyethylene glycol (MIRALAX / GLYCOLAX) 17 g packet Take 17 g by mouth daily. 11/07/20  Yes Tegeler, Gwenyth Allegra, MD  potassium chloride (KLOR-CON) 10 MEQ tablet Take 40 meq(4 tab) on Monday, Wednesday and Friday, all other days take 20 meq(2 tab) by mouth daily, Take additional 73mq(2tab) today, and 66m(6tab) x3days then back to regular dosing 04/24/21  Yes Cleaver, JeJossie NgNP  pravastatin (PRAVACHOL) 10 MG tablet Take 10 mg by mouth daily. 03/27/20  Yes [provider]  torsemide (DEMADEX) 10 MG tablet Take 10 mg by mouth daily. Pt takes 10 mg on  TUE, THUR, SAT, SUNDAY, AND 20 MG ON MON. WED AND FRIDAY   Yes [provider]  vitamin C (ASCORBIC ACID) 500 MG tablet Take 500 mg by mouth daily.   Yes [provider]  Plecanatide (TRULANCE) 3 MG TABS Take 3 mg by mouth daily. 02/02/22   RoDaneil DolinMD    Allergies as of 02/02/2022 - Review Complete 02/02/2022  Allergen Reaction Noted   Flexeril [cyclobenzaprine] Anaphylaxis 01/12/2012   Other Anaphylaxis and Other (See Comments) 02/08/2011   Keflex [cephalexin] Itching 05/26/2012   Aspirin Other (See Comments) 05/05/2015   Fentanyl Other (See Comments) 11/09/2020   Hydrocodone  03/05/2021   Adhesive [tape] Rash 11/01/2014   Chlorhexidine Rash 11/19/2014    Family History  Problem Relation Age of Onset   Breast cancer Sister    Colon cancer Sister        two deceased, one living and terminal, one current undergoing treatment, ages [id number] Hypertension Mother    Stroke Mother    Heart attack Mother    Hypertension Father    Prostate cancer Father     Social History   Socioeconomic History   Marital status: Single    Spouse name: Not on file   Number of children: 3   Years of education: Not on file   Highest education level: Not on file  Occupational History   Not on file  Tobacco Use   Smoking status: Former    Packs/day:  2.00    Years: 40.00    Total pack years: 80.00    Types: Cigarettes    Quit date: 01/19/1992    Years since quitting: 30.0   Smokeless tobacco: Never  Vaping Use   Vaping Use: Never used  Substance and Sexual Activity   Alcohol use: No   Drug use: No  Sexual activity: Not Currently  Other Topics Concern   Not on file  Social History Narrative   Not on file   Social Determinants of Health   Financial Resource Strain: Not on file  Food Insecurity: Not on file  Transportation Needs: Not on file  Physical Activity: Not on file  Stress: Not on file  Social Connections: Not on file  Intimate Partner Violence: Not on file    Review of Systems: See HPI, otherwise negative ROS  Physical Exam: BP 129/79 (BP Location: Right Arm, Patient Position: Sitting, Cuff Size: Large)   Pulse (!) 109   Temp 97.7 F (36.5 C) (Oral)   Ht 5' (1.524 m)   Wt 179 lb (81.2 kg)   SpO2 99%   BMI 34.96 kg/m  General:   Alert,  Well-developed, well-nourished, pleasant and cooperative in NAD Neck:  Supple; no masses or thyromegaly. No significant cervical adenopathy. Lungs:  Clear throughout to auscultation.   No wheezes, crackles, or rhonchi. No acute distress. Heart:  Regular rate and rhythm; no murmurs, clicks, rubs,  or gallops. Abdomen: Non-distended, normal bowel sounds.  Soft and nontender without appreciable mass or hepatosplenomegaly.  Pulses:  Normal pulses noted. Extremities:  Without clubbing or edema.  Impression/Plan: 81 year old lady with longstanding GERD, more recently poorly controlled on Nexium.  Now reports recurrent esophageal dysphagia to solids.  Constipation suboptimally controlled on OTC laxatives.  Patient was to start Trulance 3 mg daily.  Prescription did not go through.  Patient did not let us know.  This needs to be readdressed.  Findings of MRI reassuring as far as liver and pancreas findings are  concerned.   Recommendations:  Lets stop Nexium.  Begin  Protonix or pantoprazole 40 mg twice daily 30 minutes before breakfast and supper (dispense 60 with 11 refills)  Begin Trulance 3 mg daily as soon as you get the medication  As discussed we will proceed with a upper endoscopy/EGD to further evaluate difficulty swallowing (dysphagia).  ASA 3.  Information on GERD and constipation provided.  Further recommendations to follow.        Notice: This dictation was prepared with Dragon dictation along with smaller phrase technology. Any transcriptional errors that result from this process are unintentional and may not be corrected upon review.

## 2022-02-02 NOTE — Patient Instructions (Addendum)
It was good to see you again today!  Lets stop Nexium.  Begin Protonix or pantoprazole 40 mg twice daily 30 minutes before breakfast and supper (dispense 60 with 11 refills)  Begin Trulance 3 mg daily as soon as you get the medication  As discussed we will proceed with a upper endoscopy/EGD to further evaluate difficulty swallowing (dysphagia).  ASA 3.  Information on GERD and constipation provided.  Further recommendations to follow.

## 2022-02-02 NOTE — Progress Notes (Signed)
   Established Patient Office Visit  Subjective   Patient ID: Glenda Terry, female    DOB: 05/15/1940  Age: 81 y.o. MRN: [id number]  Chief Complaint  Patient presents with   Gastroesophageal Reflux    Issues with reflux, states nexium is not working. Stays nauseous and feels like her breakfast gets hung up.     HPI    ROS    Objective:     BP 129/79 (BP Location: Right Arm, Patient Position: Sitting, Cuff Size: Large)   Pulse (!) 109   Temp 97.7 F (36.5 C) (Oral)   Ht 5' (1.524 m)   Wt 179 lb (81.2 kg)   SpO2 99%   BMI 34.96 kg/m    Physical Exam   No results found for any visits on 02/02/22.    The 10-year ASCVD risk score (Arnett DK, et al., 2019) is: 37.7%    Assessment & Plan:   Problem List Items Addressed This Visit     GERD (gastroesophageal reflux disease) - Primary (Chronic)   Relevant Medications   Plecanatide (TRULANCE) 3 MG TABS   Chronic constipation   Other Visit Diagnoses     Esophageal dysphagia           No follow-ups on file.    Manus Rudd, MD

## 2022-02-02 NOTE — Progress Notes (Signed)
Primary Care Physician:  Celene Squibb, MD Primary Gastroenterologist:  Dr. Gala Romney  Pre-Procedure History & Physical: HPI:  Glenda Terry is a 81 y.o. female here for follow-up GERD and constipation.  For some reason, Trulance never went through and she did not get the prescription.  Ongoing issues with constipation.  Nexium not controlling symptoms very well.  Now, complains of esophageal dysphagia, pointing to her suprasternal notch.  CT ultrasound MRI earlier this year confirmed patient has no pancreatic mass.  Probably a fat cushion in the area of the head of the pancreas.  Benign-appearing hemangioma also found in the liver.  Past Medical History:  Diagnosis Date   Anxiety    Asthma    Back pain, chronic    CKD (chronic kidney disease) stage 3, GFR 30-59 ml/min (HCC)    Compression fx, thoracic spine (HCC)    T - 11   COPD (chronic obstructive pulmonary disease) ([place]) 12/16/2015   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HCC)    Diverticulitis    Essential hypertension    GERD (gastroesophageal reflux disease)    HOH (hard of hearing)    Inflammatory arthritis    Iron deficiency anemia 01/2012   Pulmonary eosinophilia (HCC)    Spinal stenosis    Tubular adenoma 11/2012    Past Surgical History:  Procedure Laterality Date   ABDOMINAL HYSTERECTOMY     BACK SURGERY     CARPAL TUNNEL RELEASE Right 06/2012   CATARACT EXTRACTION W/PHACO Left 11/19/2014   Procedure: CATARACT EXTRACTION PHACO AND INTRAOCULAR LENS PLACEMENT ([place]);  Surgeon: Williams Che, MD;  Location: [facility];  Service: Ophthalmology;  Laterality: Left;  CDE: 4.77   CATARACT EXTRACTION W/PHACO Right 02/03/2015   Procedure: CATARACT EXTRACTION PHACO AND INTRAOCULAR LENS PLACEMENT ([facility]);  Surgeon: Williams Che, MD;  Location: [facility];  Service: Ophthalmology;  Laterality: Right;  CDE: 4.54   COLONOSCOPY  06/19/2008   RMR: tortuous and elongated colon with scattered left-sided diverticula/colonic mucosa appeared  entirely normal. Prior colonic ulcers had resolved.   COLONOSCOPY  12/2007   Dr. Gala Romney: Scattered diffuse sigmoid diverticula, 2 areas of ulceration at the hepatic flexure. Biopsies unremarkable.   COLONOSCOPY N/A 11/20/2012   next TCS 11/2017   COLONOSCOPY WITH PROPOFOL N/A 12/22/2017   Procedure: COLONOSCOPY WITH PROPOFOL;  Surgeon: Daneil Dolin, MD; diverticula in the sigmoid and descending colon and 2 tubular adenomas.  No recommendations to repeat.     DECOMPRESSIVE LUMBAR LAMINECTOMY LEVEL 2  04/20/2012   Procedure: DECOMPRESSIVE LUMBAR LAMINECTOMY LEVEL 2;  Surgeon: Tobi Bastos, MD;  Location: [facility];  Service: Orthopedics;  Laterality: Right;  Decompressive Lumbar Laminectomy of the L4 - L5 and L5 - S1 Complete/Laminectomy L5 on the Right (X-Ray)   ESOPHAGOGASTRODUODENOSCOPY  12/2007   Dr. 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 Small hiatal hernia. Slightly pale duodenal mucosa (biopsy unremarkable)   ESOPHAGOGASTRODUODENOSCOPY (EGD) WITH PROPOFOL N/A 01/20/2017   Dr. Gala Romney: Medium sized hiatal hernia empiric esophageal dilation for history of dysphagia   ESOPHAGOGASTRODUODENOSCOPY (EGD) WITH PROPOFOL N/A 04/08/2020   Surgeon: Eloise Harman, DO;  medium size hiatal hernia 3 to 4 cm chicken bone lodged in the gastric antrum just proximal to the pylorus removed with a snare with mild bleeding at the site of lodged bone.  Normal examined duodenum.   EYE SURGERY     BIL CATARACTS   FOREIGN BODY REMOVAL N/A 04/08/2020   Procedure: FOREIGN BODY REMOVAL;  Surgeon: Eloise Harman, DO;  [facility];  Service: Endoscopy;  Laterality: N/A;   INCISIONAL HERNIA REPAIR N/A 11/12/2020   Procedure: HERNIA REPAIR INCISIONAL;  Surgeon: Aviva Signs, MD;  Location: [facility];  Service: General;  Laterality: N/A;   IR KYPHO THORACIC WITH BONE BIOPSY  02/08/2017   IR RADIOLOGIST EVAL & MGMT  02/02/2017   JOINT REPLACEMENT     KNEE  ARTHROSCOPY Right    MALONEY DILATION N/A 01/20/2017   Procedure: MALONEY DILATION;  Surgeon: Daneil Dolin, MD;  [facility]: [facility];  Service: Endoscopy;  Laterality: N/A;   POLYPECTOMY  12/22/2017   Procedure: POLYPECTOMY;  Surgeon: Daneil Dolin, MD;  [facility]: [facility];  Service: Endoscopy;;   REVERSE SHOULDER ARTHROPLASTY Right 05/24/2014   Procedure: RIGHT SHOULDER REVERSE ARTHROPLASTY;  Surgeon: Netta Cedars, MD;  Location: [facility];  Service: Orthopedics;  Laterality: Right;   REVERSE SHOULDER ARTHROPLASTY Left 06/10/2017   Procedure: LEFT REVERSE SHOULDER ARTHROPLASTY;  Surgeon: Netta Cedars, MD;  Location: [facility];  Service: Orthopedics;  Laterality: Left;   TOTAL KNEE ARTHROPLASTY  01/24/2012   Procedure: TOTAL KNEE ARTHROPLASTY;  Surgeon: Gearlean Alf, MD;  Location: [facility];  Service: Orthopedics;  Laterality: Right;   TOTAL KNEE ARTHROPLASTY Left 10/15/2019   Procedure: TOTAL KNEE ARTHROPLASTY;  Surgeon: Gaynelle Arabian, MD;  Location: [facility];  Service: Orthopedics;  Laterality: Left;  58mn   TUBAL LIGATION      Prior to Admission medications   Medication Sig Start Date End Date Taking? Authorizing Provider  Acetaminophen (APAP 500 PO) Take 2 tablets by mouth 2 (two) times daily as needed.   Yes [provider]  albuterol (PROAIR HFA) 108 (90 Base) MCG/ACT inhaler Inhale 2 puffs into the lungs every 6 (six) hours as needed for wheezing or shortness of breath (For COPD).    Yes [provider]  ALPRAZolam (Duanne Moron 0.5 MG tablet Take 0.5 tablets (0.25 mg total) by mouth 2 (two) times daily as needed for anxiety. 10/25/19  Yes GGerlene Fee NP  amLODipine (NORVASC) 5 MG tablet Take 1 tablet (5 mg total) by mouth daily. 03/27/21 02/02/22 Yes Hochrein, JJeneen Rinks MD  Benralizumab 30 MG/ML SOSY Inject 30 mg into the skin. Once A Day Every 56 Days 10/17/19  Yes [provider]  calcium carbonate (OS-CAL) 600 MG TABS tablet Take 600 mg by mouth 2 (two)  times daily with a meal.   Yes [provider]  carvedilol (COREG) 6.25 MG tablet Take 1 tablet (6.25 mg total) by mouth 2 (two) times daily. 05/08/21  Yes HMinus Breeding MD  Cholecalciferol (VITAMIN D3) 50 MCG (2000 UT) CAPS Take 1 capsule by mouth daily.   Yes [provider]  cyanocobalamin 2000 MCG tablet Take 1 tablet every day by oral route for 90 days. 09/25/21  Yes [provider]  denosumab (PROLIA) 60 MG/ML SOSY injection Inject 60 mg into the skin every 6 (six) months. 03/01/18  Yes [provider]  diphenhydrAMINE (BENADRYL) 25 mg capsule Take 25 mg by mouth at bedtime.   Yes [provider]  DULERA 200-5 MCG/ACT AERO Inhale 2 puffs into the lungs 2 (two) times daily. 10/25/19  Yes GGerlene Fee NP  esomeprazole (NEXIUM) 40 MG capsule Take 1 capsule (40 mg total) by mouth 2 (two) times daily. 10/25/19  Yes GGerlene Fee NP  ferrous sulfate 325 (65 FE) MG tablet Take 1 tablet (325 mg total) by mouth daily with  breakfast. 10/25/19  Yes Gerlene Fee, NP  fexofenadine (ALLEGRA) 180 MG tablet Take 180 mg by mouth daily.   Yes [provider]  fluticasone (FLONASE) 50 MCG/ACT nasal spray Place 1 spray into both nostrils daily as needed for allergies or rhinitis.  10/17/19  Yes [provider]  ipratropium-albuterol (DUONEB) 0.5-2.5 (3) MG/3ML SOLN Take 3 mLs by nebulization every 4 (four) hours as needed.   Yes [provider]  ketotifen (ZADITOR) 0.025 % ophthalmic solution Place 1 drop into both eyes 2 (two) times daily as needed (allergies). 10/25/19  Yes Gerlene Fee, NP  montelukast (SINGULAIR) 10 MG tablet Take 1 tablet (10 mg total) by mouth at bedtime. 07/25/20  Yes Elouise Munroe, MD  Oxycodone HCl 10 MG TABS Take 10 mg by mouth every 6 (six) hours. 12/29/19  Yes [provider]  Polyethyl Glycol-Propyl Glycol (SYSTANE OP) Place 1 drop into both eyes 3 (three) times daily as needed  (dry/irritated eyes.).    Yes [provider]  polyethylene glycol (MIRALAX / GLYCOLAX) 17 g packet Take 17 g by mouth daily. 11/07/20  Yes Tegeler, Gwenyth Allegra, MD  potassium chloride (KLOR-CON) 10 MEQ tablet Take 40 meq(4 tab) on Monday, Wednesday and Friday, all other days take 20 meq(2 tab) by mouth daily, Take additional 61mq(2tab) today, and 652m(6tab) x3days then back to regular dosing 04/24/21  Yes Cleaver, JeJossie NgNP  pravastatin (PRAVACHOL) 10 MG tablet Take 10 mg by mouth daily. 03/27/20  Yes [provider]  torsemide (DEMADEX) 10 MG tablet Take 10 mg by mouth daily. Pt takes 10 mg on  TUE, THUR, SAT, SUNDAY, AND 20 MG ON MON. WED AND FRIDAY   Yes [provider]  vitamin C (ASCORBIC ACID) 500 MG tablet Take 500 mg by mouth daily.   Yes [provider]  Plecanatide (TRULANCE) 3 MG TABS Take 3 mg by mouth daily. 02/02/22   RoDaneil DolinMD    Allergies as of 02/02/2022 - Review Complete 02/02/2022  Allergen Reaction Noted   Flexeril [cyclobenzaprine] Anaphylaxis 01/12/2012   Other Anaphylaxis and Other (See Comments) 02/08/2011   Keflex [cephalexin] Itching 05/26/2012   Aspirin Other (See Comments) 05/05/2015   Fentanyl Other (See Comments) 11/09/2020   Hydrocodone  03/05/2021   Adhesive [tape] Rash 11/01/2014   Chlorhexidine Rash 11/19/2014    Family History  Problem Relation Age of Onset   Breast cancer Sister    Colon cancer Sister        two deceased, one living and terminal, one current undergoing treatment, ages [id number] Hypertension Mother    Stroke Mother    Heart attack Mother    Hypertension Father    Prostate cancer Father     Social History   Socioeconomic History   Marital status: Single    Spouse name: Not on file   Number of children: 3   Years of education: Not on file   Highest education level: Not on file  Occupational History   Not on file  Tobacco Use   Smoking status: Former    Packs/day:  2.00    Years: 40.00    Total pack years: 80.00    Types: Cigarettes    Quit date: 01/19/1992    Years since quitting: 30.0   Smokeless tobacco: Never  Vaping Use   Vaping Use: Never used  Substance and Sexual Activity   Alcohol use: No   Drug use: No  Sexual activity: Not Currently  Other Topics Concern   Not on file  Social History Narrative   Not on file   Social Determinants of Health   Financial Resource Strain: Not on file  Food Insecurity: Not on file  Transportation Needs: Not on file  Physical Activity: Not on file  Stress: Not on file  Social Connections: Not on file  Intimate Partner Violence: Not on file    Review of Systems: See HPI, otherwise negative ROS  Physical Exam: BP 129/79 (BP Location: Right Arm, Patient Position: Sitting, Cuff Size: Large)   Pulse (!) 109   Temp 97.7 F (36.5 C) (Oral)   Ht 5' (1.524 m)   Wt 179 lb (81.2 kg)   SpO2 99%   BMI 34.96 kg/m  General:   Alert,  Well-developed, well-nourished, pleasant and cooperative in NAD Neck:  Supple; no masses or thyromegaly. No significant cervical adenopathy. Lungs:  Clear throughout to auscultation.   No wheezes, crackles, or rhonchi. No acute distress. Heart:  Regular rate and rhythm; no murmurs, clicks, rubs,  or gallops. Abdomen: Non-distended, normal bowel sounds.  Soft and nontender without appreciable mass or hepatosplenomegaly.  Pulses:  Normal pulses noted. Extremities:  Without clubbing or edema.  Impression/Plan: 81 year old lady with longstanding GERD, more recently poorly controlled on Nexium.  Now reports recurrent esophageal dysphagia to solids.  Constipation suboptimally controlled on OTC laxatives.  Patient was to start Trulance 3 mg daily.  Prescription did not go through.  Patient did not let us know.  This needs to be readdressed.  Findings of MRI reassuring as far as liver and pancreas findings are  concerned.   Recommendations:  Lets stop Nexium.  Begin  Protonix or pantoprazole 40 mg twice daily 30 minutes before breakfast and supper (dispense 60 with 11 refills)  Begin Trulance 3 mg daily as soon as you get the medication  As discussed we will proceed with a upper endoscopy/EGD to further evaluate difficulty swallowing (dysphagia).  ASA 3.  Information on GERD and constipation provided.  Further recommendations to follow.        Notice: This dictation was prepared with Dragon dictation along with smaller phrase technology. Any transcriptional errors that result from this process are unintentional and may not be corrected upon review.

## 2022-02-03 ENCOUNTER — Encounter: Payer: Self-pay | Admitting: *Deleted

## 2022-02-09 ENCOUNTER — Other Ambulatory Visit: Payer: Self-pay

## 2022-02-09 ENCOUNTER — Encounter (HOSPITAL_COMMUNITY): Payer: Self-pay

## 2022-02-09 ENCOUNTER — Encounter (HOSPITAL_COMMUNITY)
Admission: RE | Admit: 2022-02-09 | Discharge: 2022-02-09 | Disposition: A | Discharge status: Home / Self Care | Payer: PPO | Source: Ambulatory Visit | Attending: Internal Medicine | Admitting: Internal Medicine

## 2022-02-10 ENCOUNTER — Encounter (HOSPITAL_COMMUNITY)
Admission: RE | Disposition: A | Discharge status: Home / Self Care | Payer: Self-pay | Source: Ambulatory Visit | Attending: Internal Medicine

## 2022-02-10 ENCOUNTER — Ambulatory Visit (HOSPITAL_COMMUNITY): Payer: PPO | Admitting: Anesthesiology

## 2022-02-10 ENCOUNTER — Other Ambulatory Visit: Payer: Self-pay

## 2022-02-10 ENCOUNTER — Ambulatory Visit (HOSPITAL_BASED_OUTPATIENT_CLINIC_OR_DEPARTMENT_OTHER): Payer: PPO | Admitting: Anesthesiology

## 2022-02-10 ENCOUNTER — Encounter (HOSPITAL_COMMUNITY): Payer: Self-pay | Admitting: Internal Medicine

## 2022-02-10 ENCOUNTER — Ambulatory Visit (HOSPITAL_COMMUNITY)
Admission: RE | Admit: 2022-02-10 | Discharge: 2022-02-10 | Disposition: A | Discharge status: Home / Self Care | Payer: PPO | Source: Ambulatory Visit | Attending: Internal Medicine | Admitting: Internal Medicine

## 2022-02-10 DIAGNOSIS — J449 Chronic obstructive pulmonary disease, unspecified: Secondary | ICD-10-CM | POA: Insufficient documentation

## 2022-02-10 DIAGNOSIS — I129 Hypertensive chronic kidney disease with stage 1 through stage 4 chronic kidney disease, or unspecified chronic kidney disease: Secondary | ICD-10-CM | POA: Diagnosis not present

## 2022-02-10 DIAGNOSIS — K449 Diaphragmatic hernia without obstruction or gangrene: Secondary | ICD-10-CM | POA: Diagnosis not present

## 2022-02-10 DIAGNOSIS — K5900 Constipation, unspecified: Secondary | ICD-10-CM | POA: Diagnosis not present

## 2022-02-10 DIAGNOSIS — R1310 Dysphagia, unspecified: Secondary | ICD-10-CM | POA: Diagnosis not present

## 2022-02-10 DIAGNOSIS — N183 Chronic kidney disease, stage 3 unspecified: Secondary | ICD-10-CM | POA: Diagnosis not present

## 2022-02-10 DIAGNOSIS — Z87891 Personal history of nicotine dependence: Secondary | ICD-10-CM | POA: Insufficient documentation

## 2022-02-10 DIAGNOSIS — R1314 Dysphagia, pharyngoesophageal phase: Secondary | ICD-10-CM | POA: Diagnosis not present

## 2022-02-10 DIAGNOSIS — I110 Hypertensive heart disease with heart failure: Secondary | ICD-10-CM | POA: Diagnosis not present

## 2022-02-10 DIAGNOSIS — K219 Gastro-esophageal reflux disease without esophagitis: Secondary | ICD-10-CM | POA: Diagnosis not present

## 2022-02-10 DIAGNOSIS — Z79899 Other long term (current) drug therapy: Secondary | ICD-10-CM | POA: Insufficient documentation

## 2022-02-10 DIAGNOSIS — I509 Heart failure, unspecified: Secondary | ICD-10-CM | POA: Insufficient documentation

## 2022-02-10 HISTORY — PX: ESOPHAGOGASTRODUODENOSCOPY (EGD) WITH PROPOFOL: SHX5813

## 2022-02-10 HISTORY — PX: MALONEY DILATION: SHX5535

## 2022-02-10 SURGERY — ESOPHAGOGASTRODUODENOSCOPY (EGD) WITH PROPOFOL
Anesthesia: General

## 2022-02-10 MED ORDER — LIDOCAINE VISCOUS HCL 2 % MT SOLN: 15.0000 mL | Freq: Once | OROMUCOSAL | Status: AC

## 2022-02-10 MED ORDER — LIDOCAINE VISCOUS HCL 2 % MT SOLN
OROMUCOSAL | Status: AC
  Administered 2022-02-10: 15 mL via OROMUCOSAL
  Filled 2022-02-10: qty 15

## 2022-02-10 MED ORDER — PROPOFOL 10 MG/ML IV BOLUS
INTRAVENOUS | Status: DC | PRN
  Administered 2022-02-10: 60 mg via INTRAVENOUS
  Administered 2022-02-10: 30 mg via INTRAVENOUS
  Administered 2022-02-10: 40 mg via INTRAVENOUS

## 2022-02-10 MED ORDER — IPRATROPIUM-ALBUTEROL 0.5-2.5 (3) MG/3ML IN SOLN
3.0000 mL | Freq: Once | RESPIRATORY_TRACT | Status: AC
  Administered 2022-02-10: 3 mL via RESPIRATORY_TRACT

## 2022-02-10 MED ORDER — PROPOFOL 500 MG/50ML IV EMUL
INTRAVENOUS | Status: DC | PRN
  Administered 2022-02-10: 125 ug/kg/min via INTRAVENOUS

## 2022-02-10 MED ORDER — LIDOCAINE HCL (CARDIAC) PF 100 MG/5ML IV SOSY
PREFILLED_SYRINGE | INTRAVENOUS | Status: DC | PRN
  Administered 2022-02-10: 50 mg via INTRAVENOUS

## 2022-02-10 MED ORDER — IPRATROPIUM-ALBUTEROL 0.5-2.5 (3) MG/3ML IN SOLN
RESPIRATORY_TRACT | Status: AC
  Filled 2022-02-10: qty 3

## 2022-02-10 MED ORDER — METOCLOPRAMIDE HCL 5 MG/ML IJ SOLN
INTRAMUSCULAR | Status: AC
  Filled 2022-02-10: qty 2

## 2022-02-10 MED ORDER — METOCLOPRAMIDE HCL 5 MG/ML IJ SOLN
10.0000 mg | Freq: Once | INTRAMUSCULAR | Status: AC
  Administered 2022-02-10: 10 mg via INTRAVENOUS

## 2022-02-10 MED ORDER — STERILE WATER FOR IRRIGATION IR SOLN
Status: DC | PRN
  Administered 2022-02-10: 60 mL

## 2022-02-10 MED ORDER — LACTATED RINGERS IV SOLN: INTRAVENOUS | Status: DC

## 2022-02-10 NOTE — Op Note (Signed)
[facility] Patient Name: Glenda Terry Procedure Date: 02/10/2022 8:35 AM MRN: [id number] Date of Birth: [birth year] Attending MD: Norvel Richards , MD, [id number] CSN: [id number] Age: 81 Admit Type: Outpatient Procedure:                Upper GI endoscopy Indications:              Dysphagia, Suspected esophageal reflux Providers:                Norvel Richards, MD, Lambert Mody,                            Raphael Gibney, Technician Referring MD:              Medicines:                Propofol per Anesthesia Complications:            No immediate complications. Estimated Blood Loss:     Estimated blood loss: none. Procedure:                Pre-Anesthesia Assessment:                           - Prior to the procedure, a History and Physical                            was performed, and patient medications and                            allergies were reviewed. The patient's tolerance of                            previous anesthesia was also reviewed. The risks                            and benefits of the procedure and the sedation                            options and risks were discussed with the patient.                            All questions were answered, and informed consent                            was obtained. Prior Anticoagulants: The patient has                            taken no anticoagulant or antiplatelet agents. ASA                            Grade Assessment: III - A patient with severe                            systemic disease. After reviewing the risks and  benefits, the patient was deemed in satisfactory                            condition to undergo the procedure.                           After obtaining informed consent, the endoscope was                            passed under direct vision. Throughout the                            procedure, the patient's blood pressure, pulse, and                             oxygen saturations were monitored continuously. The                            [id number]-[id number] ([id number]) scope was introduced through the                            mouth, and advanced to the second part of duodenum.                            The upper GI endoscopy was accomplished without                            difficulty. The patient tolerated the procedure                            well. Scope In: 9:11:33 AM Scope Out: 9:21:03 AM Total Procedure Duration: 0 hours 9 minutes 30 seconds  Findings:      The examined esophagus was normal.      A large hiatal hernia was present.      The duodenal bulb and second portion of the duodenum were normal. The       scope was withdrawn. Dilation was performed with a Maloney dilator with       mild resistance at 24 Fr. The dilation site was examined following       endoscope reinsertion and showed no change. Estimated blood loss: none. Impression:               - Normal esophagus. Dilated.                           - Large hiatal hernia.                           - Normal duodenal bulb and second portion of the                            duodenum.                           - No specimens collected. Acid suppression regimen  recently changed to Protonix twice daily. She is                            now on Trulance for constipation. Moderate Sedation:      Moderate (conscious) sedation was personally administered by an       anesthesia professional. The following parameters were monitored: oxygen       saturation, heart rate, blood pressure, respiratory rate, EKG, adequacy       of pulmonary ventilation, and response to care. Recommendation:           - Patient has a contact number available for                            emergencies. The signs and symptoms of potential                            delayed complications were discussed with the                            patient. Return to normal activities tomorrow.                             Written discharge instructions were provided to the                            patient.                           - Resume previous diet.                           - Continue present medications.                           -                           - Return to my office in 3 months. Procedure Code(s):        --- Professional ---                           [phone], Esophagogastroduodenoscopy, flexible,                            transoral; diagnostic, including collection of                            specimen(s) by brushing or washing, when performed                            (separate procedure)                           43450, Dilation of esophagus, by unguided sound or                            bougie, single or  multiple passes Diagnosis Code(s):        --- Professional ---                           K44.9, Diaphragmatic hernia without obstruction or                            gangrene                           R13.10, Dysphagia, unspecified CPT copyright 2022 American Medical Association. All rights reserved. The codes documented in this report are preliminary and upon coder review may  be revised to meet current compliance requirements. Cristopher Estimable. Nisha Dhami, MD Norvel Richards, MD 02/10/2022 9:29:43 AM This report has been signed electronically. Number of Addenda: 0

## 2022-02-10 NOTE — Anesthesia Preprocedure Evaluation (Signed)
Anesthesia Evaluation  Patient identified by MRN, date of birth, ID band Patient awake    Reviewed: Allergy & Precautions, H&P , NPO status , Patient's Chart, lab work & pertinent test results  Airway Mallampati: II  TM Distance: >3 FB Neck ROM: Full    Dental  (+) Edentulous Upper, Edentulous Lower   Pulmonary shortness of breath and with exertion, asthma , COPD,  COPD inhaler, former smoker   breath sounds clear to auscultation + decreased breath sounds      Cardiovascular Exercise Tolerance: Poor hypertension, Pt. on medications +CHF  Normal cardiovascular exam Rhythm:Regular Rate:Normal     Neuro/Psych  Neuromuscular disease  negative psych ROS   GI/Hepatic Neg liver ROS, hiatal hernia,GERD  Medicated and Poorly Controlled,,  Endo/Other  negative endocrine ROS    Renal/GU Renal disease  negative genitourinary   Musculoskeletal  (+) Arthritis ,    Abdominal   Peds negative pediatric ROS (+)  Hematology  (+) Blood dyscrasia, anemia   Anesthesia Other Findings   Reproductive/Obstetrics negative OB ROS                             Anesthesia Physical Anesthesia Plan  ASA: 3  Anesthesia Plan: General   Post-op Pain Management: Minimal or no pain anticipated   Induction: Intravenous  PONV Risk Score and Plan: Propofol infusion  Airway Management Planned: Nasal Cannula and Natural Airway  Additional Equipment:   Intra-op Plan:   Post-operative Plan:   Informed Consent: I have reviewed the patients History and Physical, chart, labs and discussed the procedure including the risks, benefits and alternatives for the proposed anesthesia with the patient or authorized representative who has indicated his/her understanding and acceptance.       Plan Discussed with: CRNA and Surgeon  Anesthesia Plan Comments: (Patient had slight wheezing and cough, lungs clear after duoneb  reatment)        Anesthesia Quick Evaluation

## 2022-02-10 NOTE — Interval H&P Note (Signed)
History and Physical Interval Note:  02/10/2022 8:58 AM  Glenda Terry  has presented today for surgery, with the diagnosis of DYSPHAGIA.  The various methods of treatment have been discussed with the patient and family. After consideration of risks, benefits and other options for treatment, the patient has consented to  Procedure(s) with comments: ESOPHAGOGASTRODUODENOSCOPY (EGD) WITH PROPOFOL (N/A) - 8:30 AM MALONEY DILATION (N/A) as a surgical intervention.  The patient's history has been reviewed, patient examined, no change in status, stable for surgery.  I have reviewed the patient's chart and labs.  Questions were answered to the patient's satisfaction.     Glenda Terry    No change.  Patient started Protonix and Trulance.  Feeling better.  May have had a mild asthma attack this morning.  Dr. Mabeline Terry gave her some albuterol.  Also some nausea.  dose of Reglan given.  Will proceed with EGD with esophageal dilation as feasible/appropriate today per plan. The risks, benefits, limitations, alternatives and imponderables have been reviewed with the patient. Potential for esophageal dilation, biopsy, etc. have also been reviewed.  Questions have been answered. All parties agreeable.

## 2022-02-10 NOTE — Discharge Instructions (Signed)
EGD Discharge instructions Please read the instructions outlined below and refer to this sheet in the next few weeks. These discharge instructions provide you with general information on caring for yourself after you leave the hospital. Your doctor may also give you specific instructions. While your treatment has been planned according to the most current medical practices available, unavoidable complications occasionally occur. If you have any problems or questions after discharge, please call your doctor. ACTIVITY You may resume your regular activity but move at a slower pace for the next 24 hours.  Take frequent rest periods for the next 24 hours.  Walking will help expel (get rid of) the air and reduce the bloated feeling in your abdomen.  No driving for 24 hours (because of the anesthesia (medicine) used during the test).  You may shower.  Do not sign any important legal documents or operate any machinery for 24 hours (because of the anesthesia used during the test).  NUTRITION Drink plenty of fluids.  You may resume your normal diet.  Begin with a light meal and progress to your normal diet.  Avoid alcoholic beverages for 24 hours or as instructed by your caregiver.  MEDICATIONS You may resume your normal medications unless your caregiver tells you otherwise.  WHAT YOU CAN EXPECT TODAY You may experience abdominal discomfort such as a feeling of fullness or "gas" pains.  FOLLOW-UP Your doctor will discuss the results of your test with you.  SEEK IMMEDIATE MEDICAL ATTENTION IF ANY OF THE FOLLOWING OCCUR: Excessive nausea (feeling sick to your stomach) and/or vomiting.  Severe abdominal pain and distention (swelling).  Trouble swallowing.  Temperature over 101 F (37.8 C).  Rectal bleeding or vomiting of blood.      you have a large hiatal hernia which is likely contributing to your nausea and acid reflux   her esophagus was stretched today.  Going on Protonix twice daily should  help the symptoms  Continue Protonix twice daily  Continue Trulance daily for constipation  Office visit with [place] in 3 months    At patient request, I called [name]  at: [phone] -  reviewed findings and recommendations

## 2022-02-10 NOTE — Transfer of Care (Signed)
Immediate Anesthesia Transfer of Care Note  Patient: Glenda Terry  Procedure(s) Performed: ESOPHAGOGASTRODUODENOSCOPY (EGD) WITH PROPOFOL [place]  Patient Location: Short Stay  Anesthesia Type:General  Level of Consciousness: drowsy  Airway & Oxygen Therapy: Patient Spontanous Breathing and Patient connected to nasal cannula oxygen  Post-op Assessment: Report given to RN and Post -op Vital signs reviewed and stable  Post vital signs: Reviewed and stable  Last Vitals:  Vitals Value Taken Time  BP 95/47 02/10/22 0929  Temp 36.6 C 02/10/22 0929  Pulse 87 02/10/22 0929  Resp 17 02/10/22 0929  SpO2 97 % 02/10/22 0929    Last Pain:  Vitals:   02/10/22 0929  TempSrc: Oral  PainSc: 0-No pain         Complications: No notable events documented.

## 2022-02-10 NOTE — Anesthesia Postprocedure Evaluation (Signed)
Anesthesia Post Note  Patient: Glenda Terry  Procedure(s) Performed: ESOPHAGOGASTRODUODENOSCOPY (EGD) WITH PROPOFOL [name]  Patient location during evaluation: Phase II Anesthesia Type: General Level of consciousness: awake and alert and oriented Pain management: pain level controlled Vital Signs Assessment: post-procedure vital signs reviewed and stable Respiratory status: spontaneous breathing, nonlabored ventilation and respiratory function stable Cardiovascular status: blood pressure returned to baseline and stable Postop Assessment: no apparent nausea or vomiting Anesthetic complications: no  No notable events documented.   Last Vitals:  Vitals:   02/10/22 0749 02/10/22 0929  BP:  (!) 95/47  Pulse: (!) 105 87  Resp: (!) 30 17  Temp:  36.6 C  SpO2: 100% 97%    Last Pain:  Vitals:   02/10/22 0929  TempSrc: Oral  PainSc: 0-No pain                 Carliyah Cotterman C Ashutosh Dieguez

## 2022-02-15 ENCOUNTER — Telehealth: Payer: Self-pay | Admitting: *Deleted

## 2022-02-15 ENCOUNTER — Encounter (HOSPITAL_COMMUNITY): Payer: Self-pay | Admitting: Internal Medicine

## 2022-02-15 NOTE — Telephone Encounter (Signed)
Pt called and states she is sick to her stomach. She states she has a bitter taste in her mouth and her stomach is burning. Pt states she is short of breath. She is going to see if her daughter can take her to the ED.

## 2022-02-16 NOTE — Telephone Encounter (Signed)
Noted  

## 2022-02-23 DIAGNOSIS — L97912 Non-pressure chronic ulcer of unspecified part of right lower leg with fat layer exposed: Secondary | ICD-10-CM | POA: Diagnosis not present

## 2022-02-25 ENCOUNTER — Other Ambulatory Visit: Payer: Self-pay

## 2022-02-26 ENCOUNTER — Telehealth: Payer: Self-pay | Admitting: Pharmacy Technician

## 2022-02-26 DIAGNOSIS — I129 Hypertensive chronic kidney disease with stage 1 through stage 4 chronic kidney disease, or unspecified chronic kidney disease: Secondary | ICD-10-CM | POA: Diagnosis not present

## 2022-02-26 DIAGNOSIS — E211 Secondary hyperparathyroidism, not elsewhere classified: Secondary | ICD-10-CM | POA: Diagnosis not present

## 2022-02-26 DIAGNOSIS — I5032 Chronic diastolic (congestive) heart failure: Secondary | ICD-10-CM | POA: Diagnosis not present

## 2022-02-26 DIAGNOSIS — N1832 Chronic kidney disease, stage 3b: Secondary | ICD-10-CM | POA: Diagnosis not present

## 2022-02-26 NOTE — Telephone Encounter (Signed)
Auth Submission: NO AUTH NEEDED Pt will be seen @ [facility] Payer: [employer] ADVT Medication & CPT/J Code(s) submitted: Prolia (Denosumab) [id number] Route of submission (phone, fax, portal):  Phone # Fax # Auth type: Buy/Bill Units/visits requested: X1 Reference number: [id number] Rep: Hallie-M Approval from: 02/25/22 to 08/27/22   DEDUCTIBLE: $3200 - (met $2700) Patient is eligible for PAP. [employer] has been notified. Once approved patient will be scheduled @ [facility]

## 2022-03-04 ENCOUNTER — Encounter (HOSPITAL_COMMUNITY)
Admission: RE | Admit: 2022-03-04 | Discharge: 2022-03-04 | Disposition: A | Discharge status: Home / Self Care | Payer: PPO | Source: Ambulatory Visit | Attending: Internal Medicine | Admitting: Internal Medicine

## 2022-03-04 VITALS — BP 116/69 | HR 70 | Temp 97.8°F | Resp 16

## 2022-03-04 DIAGNOSIS — M810 Age-related osteoporosis without current pathological fracture: Secondary | ICD-10-CM | POA: Insufficient documentation

## 2022-03-04 MED ORDER — DENOSUMAB 60 MG/ML ~~LOC~~ SOSY
60.0000 mg | PREFILLED_SYRINGE | Freq: Once | SUBCUTANEOUS | Status: AC
  Administered 2022-03-04: 60 mg via SUBCUTANEOUS
  Filled 2022-03-04: qty 1

## 2022-03-04 NOTE — Progress Notes (Signed)
Diagnosis: Osteoporosis  Provider:  Wende Neighbors MD  Procedure: Injection  Prolia (Denosumab), Dose: 60 mg, Site: subcutaneous, Number of injections: 1  Post Care: Patient declined observation  Discharge: Condition: Good, Destination: Home . AVS provided to patient.   Performed by:  Binnie Kand, RN

## 2022-03-20 ENCOUNTER — Other Ambulatory Visit: Payer: Self-pay | Admitting: Cardiology

## 2022-03-23 DIAGNOSIS — G5602 Carpal tunnel syndrome, left upper limb: Secondary | ICD-10-CM | POA: Diagnosis not present

## 2022-03-25 DIAGNOSIS — K219 Gastro-esophageal reflux disease without esophagitis: Secondary | ICD-10-CM | POA: Diagnosis not present

## 2022-03-25 DIAGNOSIS — E538 Deficiency of other specified B group vitamins: Secondary | ICD-10-CM | POA: Insufficient documentation

## 2022-03-25 DIAGNOSIS — G5603 Carpal tunnel syndrome, bilateral upper limbs: Secondary | ICD-10-CM | POA: Diagnosis not present

## 2022-03-25 DIAGNOSIS — K449 Diaphragmatic hernia without obstruction or gangrene: Secondary | ICD-10-CM | POA: Diagnosis not present

## 2022-03-25 DIAGNOSIS — Z6834 Body mass index (BMI) 34.0-34.9, adult: Secondary | ICD-10-CM | POA: Diagnosis not present

## 2022-03-25 DIAGNOSIS — G629 Polyneuropathy, unspecified: Secondary | ICD-10-CM | POA: Diagnosis not present

## 2022-03-25 DIAGNOSIS — R600 Localized edema: Secondary | ICD-10-CM | POA: Insufficient documentation

## 2022-03-25 DIAGNOSIS — Z713 Dietary counseling and surveillance: Secondary | ICD-10-CM | POA: Diagnosis not present

## 2022-03-26 DIAGNOSIS — D7219 Other eosinophilia: Secondary | ICD-10-CM | POA: Diagnosis not present

## 2022-03-26 DIAGNOSIS — J4550 Severe persistent asthma, uncomplicated: Secondary | ICD-10-CM | POA: Diagnosis not present

## 2022-04-07 DIAGNOSIS — H26493 Other secondary cataract, bilateral: Secondary | ICD-10-CM | POA: Diagnosis not present

## 2022-04-07 DIAGNOSIS — H04123 Dry eye syndrome of bilateral lacrimal glands: Secondary | ICD-10-CM | POA: Diagnosis not present

## 2022-04-07 DIAGNOSIS — H4423 Degenerative myopia, bilateral: Secondary | ICD-10-CM | POA: Diagnosis not present

## 2022-04-07 DIAGNOSIS — H11823 Conjunctivochalasis, bilateral: Secondary | ICD-10-CM | POA: Diagnosis not present

## 2022-04-14 DIAGNOSIS — D509 Iron deficiency anemia, unspecified: Secondary | ICD-10-CM | POA: Diagnosis not present

## 2022-04-14 DIAGNOSIS — E1122 Type 2 diabetes mellitus with diabetic chronic kidney disease: Secondary | ICD-10-CM | POA: Diagnosis not present

## 2022-04-14 DIAGNOSIS — R252 Cramp and spasm: Secondary | ICD-10-CM | POA: Diagnosis not present

## 2022-04-14 DIAGNOSIS — E538 Deficiency of other specified B group vitamins: Secondary | ICD-10-CM | POA: Diagnosis not present

## 2022-04-19 DIAGNOSIS — L299 Pruritus, unspecified: Secondary | ICD-10-CM | POA: Diagnosis not present

## 2022-04-19 DIAGNOSIS — I130 Hypertensive heart and chronic kidney disease with heart failure and stage 1 through stage 4 chronic kidney disease, or unspecified chronic kidney disease: Secondary | ICD-10-CM | POA: Diagnosis not present

## 2022-04-19 DIAGNOSIS — K219 Gastro-esophageal reflux disease without esophagitis: Secondary | ICD-10-CM | POA: Diagnosis not present

## 2022-04-19 DIAGNOSIS — G5603 Carpal tunnel syndrome, bilateral upper limbs: Secondary | ICD-10-CM | POA: Diagnosis not present

## 2022-04-19 DIAGNOSIS — E538 Deficiency of other specified B group vitamins: Secondary | ICD-10-CM | POA: Diagnosis not present

## 2022-04-19 DIAGNOSIS — K449 Diaphragmatic hernia without obstruction or gangrene: Secondary | ICD-10-CM | POA: Diagnosis not present

## 2022-04-19 DIAGNOSIS — R600 Localized edema: Secondary | ICD-10-CM | POA: Diagnosis not present

## 2022-04-19 DIAGNOSIS — G629 Polyneuropathy, unspecified: Secondary | ICD-10-CM | POA: Diagnosis not present

## 2022-04-19 DIAGNOSIS — I5032 Chronic diastolic (congestive) heart failure: Secondary | ICD-10-CM | POA: Diagnosis not present

## 2022-04-19 DIAGNOSIS — Z23 Encounter for immunization: Secondary | ICD-10-CM | POA: Diagnosis not present

## 2022-04-19 DIAGNOSIS — R21 Rash and other nonspecific skin eruption: Secondary | ICD-10-CM | POA: Diagnosis not present

## 2022-04-19 DIAGNOSIS — Z0001 Encounter for general adult medical examination with abnormal findings: Secondary | ICD-10-CM | POA: Diagnosis not present

## 2022-04-28 DIAGNOSIS — L308 Other specified dermatitis: Secondary | ICD-10-CM | POA: Diagnosis not present

## 2022-05-04 DIAGNOSIS — H26491 Other secondary cataract, right eye: Secondary | ICD-10-CM | POA: Diagnosis not present

## 2022-05-07 ENCOUNTER — Other Ambulatory Visit (HOSPITAL_BASED_OUTPATIENT_CLINIC_OR_DEPARTMENT_OTHER): Payer: Self-pay | Admitting: General Practice

## 2022-05-07 DIAGNOSIS — M25522 Pain in left elbow: Secondary | ICD-10-CM | POA: Diagnosis not present

## 2022-05-07 DIAGNOSIS — G5602 Carpal tunnel syndrome, left upper limb: Secondary | ICD-10-CM | POA: Diagnosis not present

## 2022-05-07 DIAGNOSIS — M25521 Pain in right elbow: Secondary | ICD-10-CM | POA: Diagnosis not present

## 2022-05-10 DIAGNOSIS — N183 Chronic kidney disease, stage 3 unspecified: Secondary | ICD-10-CM | POA: Diagnosis not present

## 2022-05-10 DIAGNOSIS — M4180 Other forms of scoliosis, site unspecified: Secondary | ICD-10-CM | POA: Diagnosis not present

## 2022-05-10 DIAGNOSIS — M5136 Other intervertebral disc degeneration, lumbar region: Secondary | ICD-10-CM | POA: Diagnosis not present

## 2022-05-12 ENCOUNTER — Other Ambulatory Visit: Payer: Self-pay

## 2022-05-17 DIAGNOSIS — M65342 Trigger finger, left ring finger: Secondary | ICD-10-CM | POA: Diagnosis not present

## 2022-05-17 DIAGNOSIS — M65842 Other synovitis and tenosynovitis, left hand: Secondary | ICD-10-CM | POA: Diagnosis not present

## 2022-05-17 DIAGNOSIS — M65332 Trigger finger, left middle finger: Secondary | ICD-10-CM | POA: Diagnosis not present

## 2022-05-17 DIAGNOSIS — G5602 Carpal tunnel syndrome, left upper limb: Secondary | ICD-10-CM | POA: Diagnosis not present

## 2022-05-20 ENCOUNTER — Telehealth (INDEPENDENT_AMBULATORY_CARE_PROVIDER_SITE_OTHER): Payer: Self-pay | Admitting: Gastroenterology

## 2022-05-20 ENCOUNTER — Ambulatory Visit (INDEPENDENT_AMBULATORY_CARE_PROVIDER_SITE_OTHER): Payer: PPO | Admitting: Gastroenterology

## 2022-05-20 ENCOUNTER — Encounter: Payer: Self-pay | Admitting: Gastroenterology

## 2022-05-20 ENCOUNTER — Encounter (HOSPITAL_COMMUNITY): Payer: Self-pay | Admitting: Internal Medicine

## 2022-05-20 VITALS — BP 130/71 | HR 102 | Temp 97.8°F | Ht 60.0 in | Wt 175.2 lb

## 2022-05-20 DIAGNOSIS — R1032 Left lower quadrant pain: Secondary | ICD-10-CM

## 2022-05-20 DIAGNOSIS — R110 Nausea: Secondary | ICD-10-CM | POA: Diagnosis not present

## 2022-05-20 MED ORDER — PROMETHAZINE HCL 12.5 MG PO TABS: 6.2500 mg | ORAL_TABLET | Freq: Three times a day (TID) | ORAL | 0 refills | Status: DC | PRN

## 2022-05-20 MED ORDER — PROMETHAZINE HCL 12.5 MG PO TABS: 12.5000 mg | ORAL_TABLET | Freq: Three times a day (TID) | ORAL | 0 refills | Status: DC | PRN

## 2022-05-20 NOTE — Telephone Encounter (Signed)
CT abd/pelvis scheduled for 07/16/22 @ 10AM. Arrive 15 min prior. Npo 4 hrs prior. Contact pt home phone but no answer. Contacted mobile number on file and reached daughter [name]. Gave daughter appt information. Also gave central scheduling number because daughter states she is willing to take pt to [facility] for a sooner appt.

## 2022-05-20 NOTE — Progress Notes (Signed)
Gastroenterology Office Note     Primary Care Physician:  Celene Squibb, MD  Primary Gastroenterologist: Dr. Gala Romney    Chief Complaint   Chief Complaint  Patient presents with   Follow-up    Patient here today for a follow up appointment on GERD, Dysphagia and constipation. She is taking Protonix 40 mg once bid and says this controls her symptoms.She is currently taking Trulance 3 mg once per day, which is also helping with her constipation, she also takes miralax prn.She also says she has had some left sided abdominal pain ongoing for the last two weeks.      History of Present Illness   Glenda Terry is a 82 y.o. female presenting today in follow-up with a history of GERD and constipation.    Left-sided abdominal pain radiating down. Present for last few weeks. Constant. No appetite. Doesn't want to eat. Makes her nauseated to look at food. Has to force herself to eat. Ice sometimes helps. Weight 188 in March 2023, highest 193 in August. Now 175. Weight 179 in Nov 2023.   Requesting phenergan. Zofran doesn't help. States phenergan is only thing that helps. Difficult historian. She is unable to sit still. Trulance daily and Miralax prn.      Nov 2023 EGD/Dilation: Normal esophagus. Dilated.                           - Large hiatal hernia.                           - Normal duodenal bulb and second portion of the                            duodenum.                           - No specimens collected. Acid suppression regimen                            recently changed to Protonix twice daily  Colooscopy 2019: adenomas, diverticula. No surveillance due to age.    Past Medical History:  Diagnosis Date   Anxiety    Asthma    Back pain, chronic    CKD (chronic kidney disease) stage 3, GFR 30-59 ml/min (HCC)    Compression fx, thoracic spine (HCC)    T - 11   COPD (chronic obstructive pulmonary disease) ([place]) 12/16/2015   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HCC)    Diverticulitis     Essential hypertension    GERD (gastroesophageal reflux disease)    HOH (hard of hearing)    Inflammatory arthritis    Iron deficiency anemia 01/2012   Nausea with vomiting 01/14/2020   Pulmonary eosinophilia (HCC)    Spinal stenosis    Tubular adenoma 11/2012    Past Surgical History:  Procedure Laterality Date   ABDOMINAL HYSTERECTOMY     BACK SURGERY     CARPAL TUNNEL RELEASE Right 06/2012   CATARACT EXTRACTION W/PHACO Left 11/19/2014   Procedure: CATARACT EXTRACTION PHACO AND INTRAOCULAR LENS PLACEMENT ([place]);  Surgeon: Williams Che, MD;  Location: [facility];  Service: Ophthalmology;  Laterality: Left;  CDE: 4.77   CATARACT EXTRACTION W/PHACO Right 02/03/2015   Procedure: CATARACT EXTRACTION PHACO AND INTRAOCULAR LENS PLACEMENT ([facility]);  Surgeon: Williams Che, MD;  Location: [facility];  Service: Ophthalmology;  Laterality: Right;  CDE: 4.54   COLONOSCOPY  06/19/2008   RMR: tortuous and elongated colon with scattered left-sided diverticula/colonic mucosa appeared entirely normal. Prior colonic ulcers had resolved.   COLONOSCOPY  12/2007   Dr. Gala Romney: Scattered diffuse sigmoid diverticula, 2 areas of ulceration at the hepatic flexure. Biopsies unremarkable.   COLONOSCOPY N/A 11/20/2012   next TCS 11/2017   COLONOSCOPY WITH PROPOFOL N/A 12/22/2017   Procedure: COLONOSCOPY WITH PROPOFOL;  Surgeon: Daneil Dolin, MD; diverticula in the sigmoid and descending colon and 2 tubular adenomas.  No recommendations to repeat.     DECOMPRESSIVE LUMBAR LAMINECTOMY LEVEL 2  04/20/2012   Procedure: DECOMPRESSIVE LUMBAR LAMINECTOMY LEVEL 2;  Surgeon: Tobi Bastos, MD;  Location: [facility];  Service: Orthopedics;  Laterality: Right;  Decompressive Lumbar Laminectomy of the L4 - L5 and L5 - S1 Complete/Laminectomy L5 on the Right (X-Ray)   ESOPHAGOGASTRODUODENOSCOPY  12/2007   Dr. 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 Small hiatal hernia.  Slightly pale duodenal mucosa (biopsy unremarkable)   ESOPHAGOGASTRODUODENOSCOPY (EGD) WITH PROPOFOL N/A 01/20/2017   Dr. Gala Romney: Medium sized hiatal hernia empiric esophageal dilation for history of dysphagia   ESOPHAGOGASTRODUODENOSCOPY (EGD) WITH PROPOFOL N/A 04/08/2020   Surgeon: Eloise Harman, DO;  medium size hiatal hernia 3 to 4 cm chicken bone lodged in the gastric antrum just proximal to the pylorus removed with a snare with mild bleeding at the site of lodged bone.  Normal examined duodenum.   ESOPHAGOGASTRODUODENOSCOPY (EGD) WITH PROPOFOL N/A 02/10/2022   Procedure: ESOPHAGOGASTRODUODENOSCOPY (EGD) WITH PROPOFOL;  Surgeon: Daneil Dolin, MD;  [facility];  Service: Endoscopy;  Laterality: N/A;  8:30 AM   EYE SURGERY     BIL CATARACTS   FOREIGN BODY REMOVAL N/A 04/08/2020   Procedure: FOREIGN BODY REMOVAL;  Surgeon: Eloise Harman, DO;  [facility];  Service: Endoscopy;  Laterality: N/A;   INCISIONAL HERNIA REPAIR N/A 11/12/2020   Procedure: HERNIA REPAIR INCISIONAL;  Surgeon: Aviva Signs, MD;  Location: [facility];  Service: General;  Laterality: N/A;   IR KYPHO THORACIC WITH BONE BIOPSY  02/08/2017   IR RADIOLOGIST EVAL & MGMT  02/02/2017   JOINT REPLACEMENT     KNEE ARTHROSCOPY Right    MALONEY DILATION N/A 01/20/2017   Procedure: MALONEY DILATION;  Surgeon: Daneil Dolin, MD;  [facility];  Service: Endoscopy;  Laterality: N/A;   MALONEY DILATION N/A 02/10/2022   Procedure: Venia Minks DILATION;  Surgeon: Daneil Dolin, MD;  [facility];  Service: Endoscopy;  Laterality: N/A;   POLYPECTOMY  12/22/2017   Procedure: POLYPECTOMY;  Surgeon: Daneil Dolin, MD;  [facility];  Service: Endoscopy;;   REVERSE SHOULDER ARTHROPLASTY Right 05/24/2014   Procedure: RIGHT SHOULDER REVERSE ARTHROPLASTY;  Surgeon: Netta Cedars, MD;  Location: [facility];  Service: Orthopedics;  Laterality: Right;   REVERSE SHOULDER ARTHROPLASTY Left  06/10/2017   Procedure: LEFT REVERSE SHOULDER ARTHROPLASTY;  Surgeon: Netta Cedars, MD;  Location: [place];  Service: Orthopedics;  Laterality: Left;   TOTAL KNEE ARTHROPLASTY  01/24/2012   Procedure: TOTAL KNEE ARTHROPLASTY;  Surgeon: Gearlean Alf, MD;  [facility];  Service: Orthopedics;  Laterality: Right;   TOTAL KNEE ARTHROPLASTY Left 10/15/2019   Procedure: TOTAL KNEE ARTHROPLASTY;  Surgeon: Gaynelle Arabian, MD;  [facility];  Service: Orthopedics;  Laterality: Left;  19min   TUBAL LIGATION      Current Outpatient Medications  Medication Sig Dispense Refill   acetaminophen (TYLENOL) 500 MG tablet Take 1,000 mg by mouth every 6 (six) hours as needed for moderate pain.     albuterol (PROAIR HFA) 108 (90 Base) MCG/ACT inhaler Inhale 2 puffs into the lungs every 6 (six) hours as needed for wheezing or shortness of breath (For COPD).      ALPRAZolam (XANAX) 0.5 MG tablet Take 0.5 tablets (0.25 mg total) by mouth 2 (two) times daily as needed for anxiety. 10 tablet 0   amLODipine (NORVASC) 5 MG tablet TAKE ONE TABLET BY MOUTH ONCE DAILY. 90 tablet 0   Benralizumab 30 MG/ML SOSY Inject 30 mg into the skin See admin instructions. Inject 30 mg into the skin every 56 Days     calcium carbonate (OS-CAL) 600 MG TABS tablet Take 600 mg by mouth 2 (two) times daily with a meal.     carvedilol (COREG) 6.25 MG tablet Take 1 tablet (6.25 mg total) by mouth 2 (two) times daily. 180 tablet 3   Cholecalciferol (VITAMIN D3) 50 MCG (2000 UT) CAPS Take 2,000 Units by mouth daily.     cyanocobalamin 2000 MCG tablet Take 2,000 mcg by mouth daily.     denosumab (PROLIA) 60 MG/ML SOSY injection Inject 60 mg into the skin every 6 (six) months.     diphenhydrAMINE (BENADRYL) 25 mg capsule Take 25 mg by mouth at bedtime.     DULERA 200-5 MCG/ACT AERO Inhale 2 puffs into the lungs 2 (two) times daily. 8.8 g 0   ferrous sulfate 325 (65 FE) MG tablet Take 1 tablet (325 mg total) by mouth daily with breakfast.  30 tablet 0   fexofenadine (ALLEGRA) 180 MG tablet Take 180 mg by mouth daily.     fluticasone (FLONASE) 50 MCG/ACT nasal spray Place 1 spray into both nostrils daily as needed for allergies or rhinitis.      ipratropium-albuterol (DUONEB) 0.5-2.5 (3) MG/3ML SOLN Take 3 mLs by nebulization every 4 (four) hours as needed (shortness of breath).     ketotifen (ZADITOR) 0.025 % ophthalmic solution Place 1 drop into both eyes 2 (two) times daily as needed (allergies). 5 mL 0   montelukast (SINGULAIR) 10 MG tablet Take 1 tablet (10 mg total) by mouth at bedtime. 90 tablet 2   Oxycodone HCl 10 MG TABS Take 10 mg by mouth every 6 (six) hours as needed (pain).     pantoprazole (PROTONIX) 40 MG tablet Take 1 tablet (40 mg total) by mouth 2 (two) times daily. 60 tablet 11   Plecanatide (TRULANCE) 3 MG TABS Take 3 mg by mouth daily. 30 tablet 1   Polyethyl Glycol-Propyl Glycol (SYSTANE OP) Place 1 drop into both eyes 3 (three) times daily as needed (dry/irritated eyes.).      polyethylene glycol (MIRALAX / GLYCOLAX) 17 g packet Take 17 g by mouth daily. 14 each 0   potassium chloride (KLOR-CON) 10 MEQ tablet Take 40 meq(4 tab) on Monday, Wednesday and Friday, all other days take 20 meq(2 tab) by mouth daily, Take additional 43meq(2tab) today, and 56meq(6tab) x3days then back to regular dosing (Patient taking differently: Take 20-40 mEq by mouth See admin instructions. Take 40 meq on Monday, Wednesday and Friday, all other days take 20 meq by mouth daily,) 360 tablet 3   pravastatin (PRAVACHOL) 10 MG tablet Take 10 mg by mouth daily.     torsemide (DEMADEX) 20 MG tablet  TAKE 2 TABLETS BY MOUTH ON MONDAY, WEDNESDAY, AND FRIDAY. ALLOTHER DAYS TAKE 1 TABLET DAILY. 128 tablet 1   vitamin C (ASCORBIC ACID) 500 MG tablet Take 500 mg by mouth daily.     No current facility-administered medications for this visit.    Allergies as of 05/20/2022 - Review Complete 05/20/2022  Allergen Reaction Noted   Flexeril  [cyclobenzaprine] Anaphylaxis 01/12/2012   Other Anaphylaxis and Other (See Comments) 02/08/2011   Keflex [cephalexin] Itching 05/26/2012   Aspirin Other (See Comments) 05/05/2015   Fentanyl Swelling 11/09/2020   Hydrocodone  03/05/2021   Adhesive [tape] Rash 11/01/2014   Chlorhexidine Rash 11/19/2014    Family History  Problem Relation Age of Onset   Breast cancer Sister    Colon cancer Sister        two deceased, one living and terminal, one current undergoing treatment, ages 76, 42, 75, 51   Hypertension Mother    Stroke Mother    Heart attack Mother    Hypertension Father    Prostate cancer Father     Social History   Socioeconomic History   Marital status: Single    Spouse name: Not on file   Number of children: 3   Years of education: Not on file   Highest education level: Not on file  Occupational History   Not on file  Tobacco Use   Smoking status: Former    Packs/day: 2.00    Years: 40.00    Total pack years: 80.00    Types: Cigarettes    Quit date: 01/19/1992    Years since quitting: 30.3   Smokeless tobacco: Never  Vaping Use   Vaping Use: Never used  Substance and Sexual Activity   Alcohol use: No   Drug use: No   Sexual activity: Not Currently  Other Topics Concern   Not on file  Social History Narrative   Not on file   Social Determinants of Health   Financial Resource Strain: Not on file  Food Insecurity: Not on file  Transportation Needs: Not on file  Physical Activity: Not on file  Stress: Not on file  Social Connections: Not on file  Intimate Partner Violence: Not on file     Review of Systems   Gen: Denies any fever, chills, fatigue, weight loss, lack of appetite.  CV: Denies chest pain, heart palpitations, peripheral edema, syncope.  Resp: Denies shortness of breath at rest or with exertion. Denies wheezing or cough.  GI: Denies dysphagia or odynophagia. Denies jaundice, hematemesis, fecal incontinence. GU : Denies urinary  burning, urinary frequency, urinary hesitancy MS: Denies joint pain, muscle weakness, cramps, or limitation of movement.  Derm: Denies rash, itching, dry skin Psych: Denies depression, anxiety, memory loss, and confusion Heme: Denies bruising, bleeding, and enlarged lymph nodes.   Physical Exam   BP 130/71 (BP Location: Right Arm, Patient Position: Sitting, Cuff Size: Large)   Pulse (!) 102   Temp 97.8 F (36.6 C) (Temporal)   Ht 5' (1.524 m)   Wt 175 lb 3.2 oz (79.5 kg)   BMI 34.22 kg/m  General:   Alert and oriented. Anxious.   Head:  Normocephalic and atraumatic. Eyes:  Without icterus Abdomen:  +BS, soft, mild tenderness LLQ and non-distended. No HSM noted. No guarding or rebound. No masses appreciated.  Rectal:  Deferred  Msk:  Symmetrical without gross deformities. Normal posture. Extremities:  Without edema. Neurologic:  Alert and  oriented x4;  grossly normal neurologically. Skin:  Intact without  significant lesions or rashes. Psych:  Alert and cooperative. Normal mood and affect.   Assessment   Glenda Terry is a 82 y.o. female presenting today in follow-up with a history of GERD and constipation, now with abdominal pain for past few weeks.   GERD: Protonix BID controlling symptoms.  Constipation: Trulance daily and Miralax prn  LLQ Abdominal pain: with associated nausea and weight loss. EGD on file from Nov 2023. Documented weight loss from a high of 193 in August, 188 in March 2023, now 175. Weight was 179 in Nov 2023. Requesting phenergan. I recommended the ED today, as she was noting pain constant and anxious. She is declining this. Pain is not worsened with eating but does have to force self to eat. Physical exam with just mild tenderness. We will pursue CT expeditiously. May need CTA.       PLAN    Recommend ED evaluation if worsening.  CT in near future Phenergan sparingly, low dose Continue Trulance and Miralax 6 week follow-up   Annitta Needs, PhD, ANP-BC [facility]

## 2022-05-20 NOTE — Telephone Encounter (Signed)
Pt left message on voicemail returning my call.  Returned call to patient. Pt verbalized understanding.

## 2022-05-20 NOTE — Patient Instructions (Addendum)
I have sent in low dose phenergan to take sparingly. Only take 1/2 tablet if absolutely needed. May take every 8 hours. This can cause drowsiness, sedation, dry mouth.   I recommend a CT scan. Please go to the emergency room if anything worsens!   We will see you in 6 weeks.   It was a pleasure to see you today. I want to create trusting relationships with patients and provide genuine, compassionate, and quality care. I truly value your feedback, so please be on the lookout for a survey regarding your visit with me today. I appreciate your time in completing this!    Annitta Needs, PhD, ANP-BC [facility]

## 2022-05-21 DIAGNOSIS — D7219 Other eosinophilia: Secondary | ICD-10-CM | POA: Diagnosis not present

## 2022-05-21 DIAGNOSIS — J4550 Severe persistent asthma, uncomplicated: Secondary | ICD-10-CM | POA: Diagnosis not present

## 2022-05-22 DIAGNOSIS — R1032 Left lower quadrant pain: Secondary | ICD-10-CM | POA: Diagnosis not present

## 2022-05-28 ENCOUNTER — Encounter (HOSPITAL_COMMUNITY): Payer: Self-pay | Admitting: Internal Medicine

## 2022-05-31 ENCOUNTER — Other Ambulatory Visit: Payer: Self-pay | Admitting: General Practice

## 2022-06-01 DIAGNOSIS — M65331 Trigger finger, right middle finger: Secondary | ICD-10-CM | POA: Diagnosis not present

## 2022-06-01 DIAGNOSIS — M65341 Trigger finger, right ring finger: Secondary | ICD-10-CM | POA: Diagnosis not present

## 2022-06-01 DIAGNOSIS — G5602 Carpal tunnel syndrome, left upper limb: Secondary | ICD-10-CM | POA: Diagnosis not present

## 2022-06-02 ENCOUNTER — Telehealth: Payer: Self-pay | Admitting: Internal Medicine

## 2022-06-02 NOTE — Telephone Encounter (Signed)
*  STAT* If patient is at the pharmacy, call can be transferred to refill team.   1. Which medications need to be refilled? (please list name of each medication and dose if known) potassium chloride (KLOR-CON) 10 MEQ tablet   2. Which pharmacy/location (including street and city if local pharmacy) is medication to be sent to?   [place], [address]    3. Do they need a 30 day or 90 day supply? [place]

## 2022-06-03 NOTE — Telephone Encounter (Signed)
Pt called to f/u on prescription refill. Please advise.

## 2022-06-04 MED ORDER — POTASSIUM CHLORIDE ER 10 MEQ PO TBCR: EXTENDED_RELEASE_TABLET | ORAL | 3 refills | Status: DC

## 2022-06-04 NOTE — Telephone Encounter (Signed)
Refill for Potassium has been sent to [facility].

## 2022-06-06 ENCOUNTER — Telehealth: Payer: Self-pay | Admitting: Gastroenterology

## 2022-06-06 NOTE — Telephone Encounter (Signed)
Tammy: Can we get CT moved up?   Dena: can you call and check on patient to see how she is doing?

## 2022-06-07 NOTE — Telephone Encounter (Signed)
Noted  

## 2022-06-07 NOTE — Telephone Encounter (Signed)
Phoned and spoke with the pt She wants to keep the appt for the 29th for the CT because her daughter is off of work and she is the one taking her   2. Glenda Terry pt states she is still hurting in the left side but she states she will see what the CT says Friday 29th

## 2022-06-07 NOTE — Telephone Encounter (Signed)
Noted. Thank you! It looks like the CT has been moved up to this week, so that is good. I will not be able to review results as I will be out of office. However, my inbasket will be covered by other providers. Thanks!

## 2022-06-07 NOTE — Telephone Encounter (Signed)
Phoned the pt and was advised she was still asleep. Advised to call later

## 2022-06-07 NOTE — Telephone Encounter (Signed)
Every placed is booked out past the date of hers. FYI

## 2022-06-11 ENCOUNTER — Ambulatory Visit (HOSPITAL_COMMUNITY)
Admission: RE | Admit: 2022-06-11 | Discharge: 2022-06-11 | Disposition: A | Discharge status: Home / Self Care | Payer: PPO | Source: Ambulatory Visit | Attending: Gastroenterology | Admitting: Gastroenterology

## 2022-06-11 DIAGNOSIS — R1032 Left lower quadrant pain: Secondary | ICD-10-CM | POA: Insufficient documentation

## 2022-06-11 DIAGNOSIS — I700 Atherosclerosis of aorta: Secondary | ICD-10-CM | POA: Diagnosis not present

## 2022-06-11 DIAGNOSIS — R109 Unspecified abdominal pain: Secondary | ICD-10-CM | POA: Diagnosis not present

## 2022-06-11 LAB — POCT I-STAT CREATININE: Creatinine, Ser: 1.2 mg/dL — ABNORMAL HIGH (ref 0.44–1.00)

## 2022-06-11 MED ORDER — IOHEXOL 350 MG/ML SOLN
75.0000 mL | Freq: Once | INTRAVENOUS | Status: AC | PRN
  Administered 2022-06-11: 75 mL via INTRAVENOUS

## 2022-06-14 ENCOUNTER — Telehealth: Payer: Self-pay | Admitting: Internal Medicine

## 2022-06-14 NOTE — Telephone Encounter (Signed)
Pt daughter called in concerned about pt's recent CT results with PCP (results in Epic). She asked to be seen sooner by MD, informed her she does not having sooner appts than June right now but will forward to RN and MD to see if they can review results and what they suggest.   Pt daughter particularly concerned about "Aortic atherosclerotic calcification incidentally noted"   Please advise.

## 2022-06-14 NOTE — Telephone Encounter (Signed)
Elouise Munroe, MD  You; Cv Div Nl Triage3 hours ago (12:39 PM)    Spoke to the patient's daughter. She is due to have labs in 10 days with PCP, needs to include cholesterol labs. I will review these when available and will adjust statin accordingly. We discussed aortic atherosclerosis in detail and pt is not having any concerning symptoms from that at this time. Pls make next available appt with me and we will move appt as needed after reviewing labs.   Spoke to patients daughter and got patient scheduled to see Dr. Margaretann Loveless on 4/29 at 2:20. Advised patient's daughter to call back to office with any issues, questions, or concerns. Patient's daughter verbalized understanding.

## 2022-06-14 NOTE — Telephone Encounter (Signed)
Daughter would like results explained regarding CT results.  She ask for sooner appt. Will wait for doctor response and recommendations

## 2022-06-15 ENCOUNTER — Other Ambulatory Visit: Payer: Self-pay | Admitting: Cardiology

## 2022-06-21 ENCOUNTER — Other Ambulatory Visit (HOSPITAL_COMMUNITY): Payer: PPO

## 2022-06-23 DIAGNOSIS — N1832 Chronic kidney disease, stage 3b: Secondary | ICD-10-CM | POA: Diagnosis not present

## 2022-06-23 DIAGNOSIS — I5032 Chronic diastolic (congestive) heart failure: Secondary | ICD-10-CM | POA: Diagnosis not present

## 2022-06-23 DIAGNOSIS — I129 Hypertensive chronic kidney disease with stage 1 through stage 4 chronic kidney disease, or unspecified chronic kidney disease: Secondary | ICD-10-CM | POA: Diagnosis not present

## 2022-06-23 DIAGNOSIS — E211 Secondary hyperparathyroidism, not elsewhere classified: Secondary | ICD-10-CM | POA: Diagnosis not present

## 2022-07-01 ENCOUNTER — Encounter: Payer: Self-pay | Admitting: Gastroenterology

## 2022-07-01 ENCOUNTER — Ambulatory Visit (INDEPENDENT_AMBULATORY_CARE_PROVIDER_SITE_OTHER): Payer: PPO | Admitting: Gastroenterology

## 2022-07-01 VITALS — BP 121/69 | HR 82 | Temp 98.1°F | Ht 60.0 in | Wt 173.3 lb

## 2022-07-01 DIAGNOSIS — R1032 Left lower quadrant pain: Secondary | ICD-10-CM | POA: Insufficient documentation

## 2022-07-01 NOTE — Patient Instructions (Signed)
Please follow the LOW FODMAP diet for the next 2-4 weeks. You can slowly start adding back foods from the high FODMAP list one at a time later, to see which ones cause more bloating/gas.  You can take gas-x, beano, phazyme, etc as needed for gas discomfort!  I will see you in 3 months!  I enjoyed seeing you again today! At our first visit, I mentioned how I value our relationship and want to provide genuine, compassionate, and quality care. You may receive a survey regarding your visit with me, and I welcome your feedback! Thanks so much for taking the time to complete this. I look forward to seeing you again.   Gelene Mink, PhD, ANP-BC [facility]

## 2022-07-01 NOTE — Progress Notes (Signed)
Gastroenterology Office Note     Primary Care Physician:  Benita Stabile, MD  Primary Gastroenterologist: Dr. Jena Gauss    Chief Complaint   Chief Complaint  Patient presents with   Follow-up    Doing better     History of Present Illness   Glenda Terry is a 82 y.o. female presenting today in follow-up with a history of GERD and constipation. She was last seen in March 2024 with several week history of LLQ abdominal pain.   She underwent a CT without acute findings. Now returning today with much improvement in symptoms.   When having a BM, feels it is complete. Eats oatmeal/coffe/applesauce for breakfast, lunch is bean, fruit, cornbread, and greens. Has to eat small amounts throughout the day. States she feels the left-sided discomfort was related to gas. States passes gas and has significant improvement.    Nov 2023 EGD/Dilation: Normal esophagus. Dilated.                           - Large hiatal hernia.                           - Normal duodenal bulb and second portion of the                            duodenum.                           - No specimens collected. Acid suppression regimen                            recently changed to Protonix twice daily   Colooscopy 2019: adenomas, diverticula. No surveillance due to age.     Past Medical History:  Diagnosis Date   Anxiety    Asthma    Back pain, chronic    CKD (chronic kidney disease) stage 3, GFR 30-59 ml/min    Compression fx, thoracic spine    T - 11   COPD (chronic obstructive pulmonary disease) 12/16/2015   Cushing's syndrome    Diverticulitis    Essential hypertension    GERD (gastroesophageal reflux disease)    HOH (hard of hearing)    Inflammatory arthritis    Iron deficiency anemia 01/2012   Nausea with vomiting 01/14/2020   Pulmonary eosinophilia    Spinal stenosis    Tubular adenoma 11/2012    Past Surgical History:  Procedure Laterality Date   ABDOMINAL HYSTERECTOMY     BACK  SURGERY     CARPAL TUNNEL RELEASE Right 06/2012   CATARACT EXTRACTION W/PHACO Left 11/19/2014   Procedure: CATARACT EXTRACTION PHACO AND INTRAOCULAR LENS PLACEMENT (IOC);  Surgeon: Susa Simmonds, MD;  Location: [facility];  Service: Ophthalmology;  Laterality: Left;  CDE: 4.77   CATARACT EXTRACTION W/PHACO Right 02/03/2015   Procedure: CATARACT EXTRACTION PHACO AND INTRAOCULAR LENS PLACEMENT (IOC);  Surgeon: Susa Simmonds, MD;  Location: [facility];  Service: Ophthalmology;  Laterality: Right;  CDE: 4.54   COLONOSCOPY  06/19/2008   RMR: tortuous and elongated colon with scattered left-sided diverticula/colonic mucosa appeared entirely normal. Prior colonic ulcers had resolved.   COLONOSCOPY  12/2007   Dr. Jena Gauss: Scattered diffuse sigmoid diverticula, 2 areas of ulceration at the hepatic flexure. Biopsies unremarkable.  COLONOSCOPY N/A 11/20/2012   next TCS 11/2017   COLONOSCOPY WITH PROPOFOL N/A 12/22/2017   Procedure: COLONOSCOPY WITH PROPOFOL;  Surgeon: Corbin Ade, MD; diverticula in the sigmoid and descending colon and 2 tubular adenomas.  No recommendations to repeat.     DECOMPRESSIVE LUMBAR LAMINECTOMY LEVEL 2  04/20/2012   Procedure: DECOMPRESSIVE LUMBAR LAMINECTOMY LEVEL 2;  Surgeon: Jacki Cones, MD;  Location: [facility];  Service: Orthopedics;  Laterality: Right;  Decompressive Lumbar Laminectomy of the L4 - L5 and L5 - S1 Complete/Laminectomy L5 on the Right (X-Ray)   ESOPHAGOGASTRODUODENOSCOPY  12/2007   Dr. 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 Small hiatal hernia. Slightly pale duodenal mucosa (biopsy unremarkable)   ESOPHAGOGASTRODUODENOSCOPY (EGD) WITH PROPOFOL N/A 01/20/2017   Dr. Jena Gauss: Medium sized hiatal hernia empiric esophageal dilation for history of dysphagia   ESOPHAGOGASTRODUODENOSCOPY (EGD) WITH PROPOFOL N/A 04/08/2020   Surgeon: Lanelle Bal, DO;  medium size hiatal hernia 3 to 4 cm chicken bone lodged in  the gastric antrum just proximal to the pylorus removed with a snare with mild bleeding at the site of lodged bone.  Normal examined duodenum.   ESOPHAGOGASTRODUODENOSCOPY (EGD) WITH PROPOFOL N/A 02/10/2022   Procedure: ESOPHAGOGASTRODUODENOSCOPY (EGD) WITH PROPOFOL;  Surgeon: Corbin Ade, MD;  Location: [facility];  Service: Endoscopy;  Laterality: N/A;  8:30 AM   EYE SURGERY     BIL CATARACTS   FOREIGN BODY REMOVAL N/A 04/08/2020   Procedure: FOREIGN BODY REMOVAL;  Surgeon: Lanelle Bal, DO;  Location: [facility];  Service: Endoscopy;  Laterality: N/A;   INCISIONAL HERNIA REPAIR N/A 11/12/2020   Procedure: HERNIA REPAIR INCISIONAL;  Surgeon: Franky Macho, MD;  Location: [facility];  Service: General;  Laterality: N/A;   IR KYPHO THORACIC WITH BONE BIOPSY  02/08/2017   IR RADIOLOGIST EVAL & MGMT  02/02/2017   JOINT REPLACEMENT     KNEE ARTHROSCOPY Right    MALONEY DILATION N/A 01/20/2017   Procedure: MALONEY DILATION;  Surgeon: Corbin Ade, MD;  Location: [facility];  Service: Endoscopy;  Laterality: N/A;   MALONEY DILATION N/A 02/10/2022   Procedure: Elease Hashimoto DILATION;  Surgeon: Corbin Ade, MD;  Location: [facility];  Service: Endoscopy;  Laterality: N/A;   POLYPECTOMY  12/22/2017   Procedure: POLYPECTOMY;  Surgeon: Corbin Ade, MD;  Location: [facility];  Service: Endoscopy;;   REVERSE SHOULDER ARTHROPLASTY Right 05/24/2014   Procedure: RIGHT SHOULDER REVERSE ARTHROPLASTY;  Surgeon: Beverely Low, MD;  Location[facility];  Service: Orthopedics;  Laterality: Right;   REVERSE SHOULDER ARTHROPLASTY Left 06/10/2017   Procedure: LEFT REVERSE SHOULDER ARTHROPLASTY;  Surgeon: Beverely Low, MD;  Location: [facility] OR;  Service: Orthopedics;  Laterality: Left;   TOTAL KNEE ARTHROPLASTY  01/24/2012   Procedure: TOTAL KNEE ARTHROPLASTY;  Surgeon: Loanne Drilling, MD;  Location: [facility];  Service: Orthopedics;  Laterality: Right;   TOTAL KNEE ARTHROPLASTY Left 10/15/2019    Procedure: TOTAL KNEE ARTHROPLASTY;  Surgeon: Ollen Gross, MD;  Location: [facility];  Service: Orthopedics;  Laterality: Left;    TUBAL LIGATION      Current Outpatient Medications  Medication Sig Dispense Refill   acetaminophen (TYLENOL) 500 MG tablet Take 1,000 mg by mouth every 6 (six) hours as needed for moderate pain.     albuterol (PROAIR HFA) 108 (90 Base) MCG/ACT inhaler Inhale 2 puffs into the lungs every 6 (six) hours as needed for wheezing or shortness of  breath (For COPD).      ALPRAZolam (XANAX) 0.5 MG tablet Take 0.5 tablets (0.25 mg total) by mouth 2 (two) times daily as needed for anxiety. 10 tablet 0   amLODipine (NORVASC) 5 MG tablet TAKE ONE TABLET BY MOUTH ONCE DAILY. 90 tablet 0   Benralizumab 30 MG/ML SOSY Inject 30 mg into the skin See admin instructions. Inject 30 mg into the skin every 56 Days     calcium carbonate (OS-CAL) 600 MG TABS tablet Take 600 mg by mouth 2 (two) times daily with a meal.     carvedilol (COREG) 6.25 MG tablet TAKE (1) TABLET BY MOUTH TWICE DAILY. 60 tablet 3   Cholecalciferol (VITAMIN D3) 50 MCG (2000 UT) CAPS Take 2,000 Units by mouth daily.     cyanocobalamin 2000 MCG tablet Take 2,000 mcg by mouth daily.     denosumab (PROLIA) 60 MG/ML SOSY injection Inject 60 mg into the skin every 6 (six) months.     diphenhydrAMINE (BENADRYL) 25 mg capsule Take 25 mg by mouth at bedtime.     DULERA 200-5 MCG/ACT AERO Inhale 2 puffs into the lungs 2 (two) times daily. 8.8 g 0   ferrous sulfate 325 (65 FE) MG tablet Take 1 tablet (325 mg total) by mouth daily with breakfast. 30 tablet 0   fexofenadine (ALLEGRA) 180 MG tablet Take 180 mg by mouth daily.     fluticasone (FLONASE) 50 MCG/ACT nasal spray Place 1 spray into both nostrils daily as needed for allergies or rhinitis.      ipratropium-albuterol (DUONEB) 0.5-2.5 (3) MG/3ML SOLN Take 3 mLs by nebulization every 4 (four) hours as needed (shortness of breath).     ketotifen (ZADITOR) 0.025 %  ophthalmic solution Place 1 drop into both eyes 2 (two) times daily as needed (allergies). 5 mL 0   montelukast (SINGULAIR) 10 MG tablet Take 1 tablet (10 mg total) by mouth at bedtime. 90 tablet 2   Oxycodone HCl 10 MG TABS Take 10 mg by mouth every 6 (six) hours as needed (pain).     pantoprazole (PROTONIX) 40 MG tablet Take 1 tablet (40 mg total) by mouth 2 (two) times daily. 60 tablet 11   Polyethyl Glycol-Propyl Glycol (SYSTANE OP) Place 1 drop into both eyes 3 (three) times daily as needed (dry/irritated eyes.).      polyethylene glycol (MIRALAX / GLYCOLAX) 17 g packet Take 17 g by mouth daily. 14 each 0   potassium chloride (KLOR-CON) 10 MEQ tablet Take 40 meq(4 tab) on Monday, Wednesday and Friday, all other days take 20 meq(2 tab) by mouth daily, Take additional 70meq(2tab) today, and 41meq(6tab) x3days then back to regular dosing 360 tablet 3   pravastatin (PRAVACHOL) 10 MG tablet Take 10 mg by mouth daily.     promethazine (PHENERGAN) 12.5 MG tablet Take 0.5 tablets (6.25 mg total) by mouth every 8 (eight) hours as needed for nausea or vomiting. 10 tablet 0   torsemide (DEMADEX) 20 MG tablet TAKE 2 TABLETS BY MOUTH ON MONDAY, WEDNESDAY, AND FRIDAY. ALLOTHER DAYS TAKE 1 TABLET DAILY. 128 tablet 1   vitamin C (ASCORBIC ACID) 500 MG tablet Take 500 mg by mouth daily.     Plecanatide (TRULANCE) 3 MG TABS Take 3 mg by mouth daily. (Patient not taking: Reported on 07/01/2022) 30 tablet 1   No current facility-administered medications for this visit.    Allergies as of 07/01/2022 - Review Complete 07/01/2022  Allergen Reaction Noted   Flexeril [cyclobenzaprine] Anaphylaxis 01/12/2012  Other Anaphylaxis and Other (See Comments) 02/08/2011   Keflex [cephalexin] Itching 05/26/2012   Aspirin Other (See Comments) 05/05/2015   Fentanyl Swelling 11/09/2020   Hydrocodone  03/05/2021   Adhesive [tape] Rash 11/01/2014   Chlorhexidine Rash 11/19/2014    Family History  Problem Relation Age of  Onset   Breast cancer Sister    Colon cancer Sister        two deceased, one living and terminal, one current undergoing treatment, ages 12, 72, 34, 43   Hypertension Mother    Stroke Mother    Heart attack Mother    Hypertension Father    Prostate cancer Father     Social History   Socioeconomic History   Marital status: Single    Spouse name: Not on file   Number of children: 3   Years of education: Not on file   Highest education level: Not on file  Occupational History   Not on file  Tobacco Use   Smoking status: Former    Packs/day: 2.00    Years: 40.00    Additional pack years: 0.00    Total pack years: 80.00    Types: Cigarettes    Quit date: 01/19/1992    Years since quitting: 30.4   Smokeless tobacco: Never  Vaping Use   Vaping Use: Never used  Substance and Sexual Activity   Alcohol use: No   Drug use: No   Sexual activity: Not Currently  Other Topics Concern   Not on file  Social History Narrative   Not on file   Social Determinants of Health   Financial Resource Strain: Not on file  Food Insecurity: Not on file  Transportation Needs: Not on file  Physical Activity: Not on file  Stress: Not on file  Social Connections: Not on file  Intimate Partner Violence: Not on file     Review of Systems   Gen: Denies any fever, chills, fatigue, weight loss, lack of appetite.  CV: Denies chest pain, heart palpitations, peripheral edema, syncope.  Resp: Denies shortness of breath at rest or with exertion. Denies wheezing or cough.  GI: Denies dysphagia or odynophagia. Denies jaundice, hematemesis, fecal incontinence. GU : Denies urinary burning, urinary frequency, urinary hesitancy MS: Denies joint pain, muscle weakness, cramps, or limitation of movement.  Derm: Denies rash, itching, dry skin Psych: Denies depression, anxiety, memory loss, and confusion Heme: Denies bruising, bleeding, and enlarged lymph nodes.   Physical Exam   BP 121/69 (BP Location:  Right Arm, Patient Position: Sitting, Cuff Size: Large)   Pulse 82   Temp 98.1 F (36.7 C) (Oral)   Ht 5' (1.524 m)   Wt 173 lb 4.8 oz (78.6 kg)   SpO2 97%   BMI 33.85 kg/m  General:   Alert and oriented. Pleasant and cooperative. Well-nourished and well-developed.  Head:  Normocephalic and atraumatic. Eyes:  Without icterus Abdomen:  +BS, soft, non-tender and non-distended. No HSM noted. No guarding or rebound. No masses appreciated.  Rectal:  Deferred  Msk:  Symmetrical without gross deformities. Normal posture. Extremities:  Without edema. Neurologic:  Alert and  oriented x4;  grossly normal neurologically. Skin:  Intact without significant lesions or rashes. Psych:  Alert and cooperative. Normal mood and affect.   Assessment   Glenda Terry is a 82 y.o. female presenting today in follow-up for LLQ abdominal pain.  CT on file without concerning findings. LLQ abdominal pain improved with passage of gas. She notes complete, spontaneous bowel movements. Upon  dietary recall, I suspect her daily intake of beans is largely contributing to gas.   Will trial low FODMAP diet for a few weeks, then slowly introduce other items. She can use OTC gas agents as needed.    PLAN    FODMAP diet provided 3 month follow-up   Gelene Mink, PhD, ANP-BC [facility]

## 2022-07-12 ENCOUNTER — Encounter: Payer: Self-pay | Admitting: Internal Medicine

## 2022-07-12 ENCOUNTER — Ambulatory Visit: Payer: PPO | Attending: Internal Medicine | Admitting: Internal Medicine

## 2022-07-12 VITALS — BP 124/76 | HR 84 | Ht 60.0 in | Wt 177.4 lb

## 2022-07-12 DIAGNOSIS — I700 Atherosclerosis of aorta: Secondary | ICD-10-CM

## 2022-07-12 DIAGNOSIS — I10 Essential (primary) hypertension: Secondary | ICD-10-CM

## 2022-07-12 DIAGNOSIS — E782 Mixed hyperlipidemia: Secondary | ICD-10-CM | POA: Diagnosis not present

## 2022-07-12 DIAGNOSIS — M7989 Other specified soft tissue disorders: Secondary | ICD-10-CM

## 2022-07-12 NOTE — Progress Notes (Signed)
Cardiology Office Note:    Date:  07/12/2022   ID:  Glenda Terry, DOB 1940-11-11, MRN [id number]  PCP:  Benita Stabile, MD  Cardiologist:  Parke Poisson, MD  Electrophysiologist:  None   Referring MD: Benita Stabile, MD   Chief Complaint/Reason for Referral: HFpEF  History of Present Illness:    Glenda Terry is a 82 y.o. female with a history of HTN, CKD stage 3, anxiety, COPD, cushing's syndrome, GERD, inflammatory arthritis, spinal stenosis, pulmonary eosinophilia, and HFpEF who presents for follow up. Her daughter Hector Shade is with her and provides collaborative history. She is also my patient.   Has been previously evaluated for palpitations, cardiac monitor showed sinus tach and SR in 2014. Had an echo in 2017 that was grossly normal. Seen in 2018, no significant pathology identified.  Today: Patient is feeling well overall, has had some dietary indiscretions but otherwise is feeling well.  Does have a cough but feels it is related to allergies, used her inhaler prior to coming to the appointment and is mobilizing some secretions. Reviewed CT with her daughter by phone, noted aortic atherosclerosis. Takes pravastatin 10 mg daily.  She notes upcoming lab work with her primary care doctor on May 11, if lipids are significantly elevated I would at this time consider increasing the dose of pravastatin, however patient is in favor of maintaining her current dose which is reasonable.  Last visit: She is feeling much better. She attributes this partially to worrying less about her condition. She is being fully compliant with her medication.  She is cutting back on tonic water. She drinks more water than 40 oz on hot days. She has no increased swelling from this.  She has a small amount of edema. Her legs usually have a small amount of swelling, and they are at a normal level currently. She regularly wears her compression socks. She has a small fat deposit on the ankle which  bothers her. Compression socks and elevating the foot do not help it. D/w her podiatrist who does not recommend intervention.  She also complains of itchy skin on her legs. She is due to see dermatology for this.   The patient denies chest pain, chest pressure, PND, orthopnea. Denies cough, fever, chills. Denies nausea, vomiting. Denies syncope or presyncope. Denies dizziness or lightheadedness.   Past Medical History:  Diagnosis Date   Anxiety    Asthma    Back pain, chronic    CKD (chronic kidney disease) stage 3, GFR 30-59 ml/min (HCC)    Compression fx, thoracic spine (HCC)    T - 11   COPD (chronic obstructive pulmonary disease) (HCC) 12/16/2015   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HCC)    Diverticulitis    Essential hypertension    GERD (gastroesophageal reflux disease)    HOH (hard of hearing)    Inflammatory arthritis    Iron deficiency anemia 01/2012   Nausea with vomiting 01/14/2020   Pulmonary eosinophilia (HCC)    Spinal stenosis    Tubular adenoma 11/2012    Past Surgical History:  Procedure Laterality Date   ABDOMINAL HYSTERECTOMY     BACK SURGERY     CARPAL TUNNEL RELEASE Right 06/2012   CATARACT EXTRACTION W/PHACO Left 11/19/2014   Procedure: CATARACT EXTRACTION PHACO AND INTRAOCULAR LENS PLACEMENT (IOC);  Surgeon: Susa Simmonds, MD;  Location: [facility];  Service: Ophthalmology;  Laterality: Left;  CDE: 4.77   CATARACT EXTRACTION W/PHACO Right 02/03/2015   Procedure: CATARACT EXTRACTION PHACO AND  INTRAOCULAR LENS PLACEMENT (IOC);  Surgeon: Susa Simmonds, MD;  Location: [facility];  Service: Ophthalmology;  Laterality: Right;  CDE: 4.54   COLONOSCOPY  06/19/2008   RMR: tortuous and elongated colon with scattered left-sided diverticula/colonic mucosa appeared entirely normal. Prior colonic ulcers had resolved.   COLONOSCOPY  12/2007   Dr. Jena Gauss: Scattered diffuse sigmoid diverticula, 2 areas of ulceration at the hepatic flexure. Biopsies unremarkable.   COLONOSCOPY N/A  11/20/2012   next TCS 11/2017   COLONOSCOPY WITH PROPOFOL N/A 12/22/2017   Procedure: COLONOSCOPY WITH PROPOFOL;  Surgeon: Corbin Ade, MD; diverticula in the sigmoid and descending colon and 2 tubular adenomas.  No recommendations to repeat.     DECOMPRESSIVE LUMBAR LAMINECTOMY LEVEL 2  04/20/2012   Procedure: DECOMPRESSIVE LUMBAR LAMINECTOMY LEVEL 2;  Surgeon: Jacki Cones, MD;  Location: [facility];  Service: Orthopedics;  Laterality: Right;  Decompressive Lumbar Laminectomy of the L4 - L5 and L5 - S1 Complete/Laminectomy L5 on the Right (X-Ray)   ESOPHAGOGASTRODUODENOSCOPY  12/2007   Dr. 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 Small hiatal hernia. Slightly pale duodenal mucosa (biopsy unremarkable)   ESOPHAGOGASTRODUODENOSCOPY (EGD) WITH PROPOFOL N/A 01/20/2017   Dr. Jena Gauss: Medium sized hiatal hernia empiric esophageal dilation for history of dysphagia   ESOPHAGOGASTRODUODENOSCOPY (EGD) WITH PROPOFOL N/A 04/08/2020   Surgeon: Glenda Bal, DO;  medium size hiatal hernia 3 to 4 cm chicken bone lodged in the gastric antrum just proximal to the pylorus removed with a snare with mild bleeding at the site of lodged bone.  Normal examined duodenum.   ESOPHAGOGASTRODUODENOSCOPY (EGD) WITH PROPOFOL N/A 02/10/2022   Procedure: ESOPHAGOGASTRODUODENOSCOPY (EGD) WITH PROPOFOL;  Surgeon: Corbin Ade, MD;  [facility];  Service: Endoscopy;  Laterality: N/A;  8:30 AM   EYE SURGERY     BIL CATARACTS   FOREIGN BODY REMOVAL N/A 04/08/2020   Procedure: FOREIGN BODY REMOVAL;  Surgeon: Glenda Bal, DO;  [facility]: [facility];  Service: Endoscopy;  Laterality: N/A;   INCISIONAL HERNIA REPAIR N/A 11/12/2020   Procedure: HERNIA REPAIR INCISIONAL;  Surgeon: Franky Macho, MD;  Location: [facility];  Service: General;  Laterality: N/A;   IR KYPHO THORACIC WITH BONE BIOPSY  02/08/2017   IR RADIOLOGIST EVAL & MGMT  02/02/2017   JOINT  REPLACEMENT     KNEE ARTHROSCOPY Right    MALONEY DILATION N/A 01/20/2017   Procedure: MALONEY DILATION;  Surgeon: Corbin Ade, MD;  [facility]: [facility];  Service: Endoscopy;  Laterality: N/A;   MALONEY DILATION N/A 02/10/2022   Procedure: Elease Hashimoto DILATION;  Surgeon: Corbin Ade, MD;  [facility];  Service: Endoscopy;  Laterality: N/A;   POLYPECTOMY  12/22/2017   Procedure: POLYPECTOMY;  Surgeon: Corbin Ade, MD;  [facility];  Service: Endoscopy;;   REVERSE SHOULDER ARTHROPLASTY Right 05/24/2014   Procedure: RIGHT SHOULDER REVERSE ARTHROPLASTY;  Surgeon: Beverely Low, MD;  Location: [facility] OR;  Service: Orthopedics;  Laterality: Right;   REVERSE SHOULDER ARTHROPLASTY Left 06/10/2017   Procedure: LEFT REVERSE SHOULDER ARTHROPLASTY;  Surgeon: Beverely Low, MD;  Location: [facility] OR;  Service: Orthopedics;  Laterality: Left;   TOTAL KNEE ARTHROPLASTY  01/24/2012   Procedure: TOTAL KNEE ARTHROPLASTY;  Surgeon: Loanne Drilling, MD;  Location: [facility];  Service: Orthopedics;  Laterality: Right;   TOTAL KNEE ARTHROPLASTY Left 10/15/2019   Procedure: TOTAL KNEE ARTHROPLASTY;  Surgeon: Ollen Gross, MD;  Location: [facility];  Service:  Orthopedics;  Laterality: Left;    TUBAL LIGATION      Current Medications: Current Meds  Medication Sig   acetaminophen (TYLENOL) 500 MG tablet Take 1,000 mg by mouth every 6 (six) hours as needed for moderate pain.   albuterol (PROAIR HFA) 108 (90 Base) MCG/ACT inhaler Inhale 2 puffs into the lungs every 6 (six) hours as needed for wheezing or shortness of breath (For COPD).    ALPRAZolam (XANAX) 0.5 MG tablet Take 0.5 tablets (0.25 mg total) by mouth 2 (two) times daily as needed for anxiety.   amLODipine (NORVASC) 5 MG tablet TAKE ONE TABLET BY MOUTH ONCE DAILY.   Benralizumab 30 MG/ML SOSY Inject 30 mg into the skin See admin instructions. Inject 30 mg into the skin every 56 Days   calcium carbonate (OS-CAL) 600 MG TABS tablet  Take 600 mg by mouth 2 (two) times daily with a meal.   carvedilol (COREG) 6.25 MG tablet TAKE (1) TABLET BY MOUTH TWICE DAILY.   Cholecalciferol (VITAMIN D3) 50 MCG (2000 UT) CAPS Take 2,000 Units by mouth daily.   cyanocobalamin 2000 MCG tablet Take 2,000 mcg by mouth daily.   denosumab (PROLIA) 60 MG/ML SOSY injection Inject 60 mg into the skin every 6 (six) months.   diphenhydrAMINE (BENADRYL) 25 mg capsule Take 25 mg by mouth at bedtime.   DULERA 200-5 MCG/ACT AERO Inhale 2 puffs into the lungs 2 (two) times daily.   ferrous sulfate 325 (65 FE) MG tablet Take 1 tablet (325 mg total) by mouth daily with breakfast.   fexofenadine (ALLEGRA) 180 MG tablet Take 180 mg by mouth daily.   fluticasone (FLONASE) 50 MCG/ACT nasal spray Place 1 spray into both nostrils daily as needed for allergies or rhinitis.    ipratropium-albuterol (DUONEB) 0.5-2.5 (3) MG/3ML SOLN Take 3 mLs by nebulization every 4 (four) hours as needed (shortness of breath).   ketotifen (ZADITOR) 0.025 % ophthalmic solution Place 1 drop into both eyes 2 (two) times daily as needed (allergies).   montelukast (SINGULAIR) 10 MG tablet Take 1 tablet (10 mg total) by mouth at bedtime.   Oxycodone HCl 10 MG TABS Take 10 mg by mouth every 6 (six) hours as needed (pain).   pantoprazole (PROTONIX) 40 MG tablet Take 1 tablet (40 mg total) by mouth 2 (two) times daily.   Polyethyl Glycol-Propyl Glycol (SYSTANE OP) Place 1 drop into both eyes 3 (three) times daily as needed (dry/irritated eyes.).    polyethylene glycol (MIRALAX / GLYCOLAX) 17 g packet Take 17 g by mouth daily.   potassium chloride (KLOR-CON) 10 MEQ tablet Take 40 meq(4 tab) on Monday, Wednesday and Friday, all other days take 20 meq(2 tab) by mouth daily, Take additional 14meq(2tab) today, and 72meq(6tab) x3days then back to regular dosing   pravastatin (PRAVACHOL) 10 MG tablet Take 10 mg by mouth daily.   promethazine (PHENERGAN) 12.5 MG tablet Take 0.5 tablets (6.25 mg  total) by mouth every 8 (eight) hours as needed for nausea or vomiting.   torsemide (DEMADEX) 20 MG tablet TAKE 2 TABLETS BY MOUTH ON MONDAY, WEDNESDAY, AND FRIDAY. ALLOTHER DAYS TAKE 1 TABLET DAILY.   vitamin C (ASCORBIC ACID) 500 MG tablet Take 500 mg by mouth daily.     Allergies:   Flexeril [cyclobenzaprine], Other, Keflex [cephalexin], Aspirin, Fentanyl, Hydrocodone, Adhesive [tape], and Chlorhexidine   Social History   Tobacco Use   Smoking status: Former    Packs/day: 2.00    Years: 40.00    Additional  pack years: 0.00    Total pack years: 80.00    Types: Cigarettes    Quit date: 01/19/1992    Years since quitting: 30.4   Smokeless tobacco: Never  Vaping Use   Vaping Use: Never used  Substance Use Topics   Alcohol use: No   Drug use: No     Family History: The patient's family history includes Breast cancer in her sister; Colon cancer in her sister; Heart attack in her mother; Hypertension in her father and mother; Prostate cancer in her father; Stroke in her mother.  ROS:   Please see the history of present illness.    (+) Minor Edema  (+) Itchy and blotchy skin on legs   All other systems reviewed and are negative.  EKGs/Labs/Other Studies Reviewed:    The following studies were reviewed today:  Studies:  Echocardiogram 08/01/20: FINDINGS   Left Ventricle: Left ventricular ejection fraction, by estimation, is 60  to 65%. The left ventricle has normal function. The left ventricle has no  regional wall motion abnormalities. The left ventricular internal cavity  size was normal in size. There is   no left ventricular hypertrophy. Left ventricular diastolic parameters  are consistent with Grade I diastolic dysfunction (impaired relaxation).   Right Ventricle: The right ventricular size is normal. No increase in  right ventricular wall thickness. Right ventricular systolic function is  normal.   Left Atrium: Left atrial size was normal in size.   Right  Atrium: Right atrial size was normal in size.   Pericardium: There is no evidence of pericardial effusion.   Mitral Valve: The mitral valve is normal in structure. No evidence of  mitral valve regurgitation.   Tricuspid Valve: The tricuspid valve is normal in structure. Tricuspid  valve regurgitation is not demonstrated. No evidence of tricuspid  stenosis.   Aortic Valve: The aortic valve is tricuspid. Aortic valve regurgitation is  not visualized. No aortic stenosis is present.   Pulmonic Valve: The pulmonic valve was normal in structure. Pulmonic valve  regurgitation is not visualized. No evidence of pulmonic stenosis.   Aorta: The aortic root and ascending aorta are structurally normal, with  no evidence of dilitation.   Venous: The inferior vena cava is normal in size with greater than 50%  respiratory variability, suggesting right atrial pressure of 3 mmHg.   IAS/Shunts: The atrial septum is grossly normal.   Non-Telemetry Monitoring 02/24/17:  Available strips from 7 day event recorder reviewed. There was only one returned strip that demonstrated sinus tachycardia.  EKG: EKG independently reviewed 07/12/22: NSR, low voltage. 10/23/21: normal sinus rhythm  Recent Labs: 11/09/2021: ALT 15; BUN 10; Hemoglobin 13.2; Platelets 297; Potassium 3.7; Sodium 135 06/11/2022: Creatinine, Ser 1.20  Recent Lipid Panel No results found for: "CHOL", "TRIG", "HDL", "CHOLHDL", "VLDL", "LDLCALC", "LDLDIRECT"  Physical Exam:    VS:  BP 124/76   Pulse 84   Ht 5' (1.524 m)   Wt 177 lb 6.4 oz (80.5 kg)   SpO2 95%   BMI 34.65 kg/m     Wt Readings from Last 5 Encounters:  07/12/22 177 lb 6.4 oz (80.5 kg)  07/01/22 173 lb 4.8 oz (78.6 kg)  05/20/22 175 lb 3.2 oz (79.5 kg)  02/09/22 179 lb (81.2 kg)  02/02/22 179 lb (81.2 kg)    Constitutional: No acute distress Eyes: Bilateral medial xanthomas ENMT: normal dentition, moist mucous membranes Cardiovascular: regular rhythm, normal  rate, no murmurs. S1 and S2 normal. No jugular venous distention.  Respiratory: clear to auscultation bilaterally GI : normal bowel sounds, soft and nontender. No distention.   MSK: extremities warm, well perfused. 1+ bilat edema puffy nonpitting.  NEURO: grossly nonfocal exam, moves all extremities. PSYCH: alert and oriented x 3, normal mood and affect.   ASSESSMENT:    1. Essential hypertension   2. Mixed hyperlipidemia   3. Swelling of lower extremity   4. Leg swelling   5. Aortic atherosclerosis (HCC)      PLAN:    Hypertension, unspecified type - Swelling of lower extremity -  - Doing really well currently. Class I symptoms.  - continue amlodipine 5 mg, carvedilol 6.25 mg BID, torsemide 10 mg TThSat, 20 mg MWF.  Mixed hyperlipidemia - on pravastatin 10 mg daily, most recent lipids OSH show LDL 107, Trig 66, HDL 56. Could uptitrate, will consider at follow up. Patient prefers no changes.  Does have aortic atherosclerosis which have discussed with her daughter by phone, with this in mind would increase dose of statin, however patient in favor of maintaining current dose.  Will await additional lab work for further decision-making.  Lower extremity edema - torsemide at stable dose. Compression socks.  She notes recent dietary indiscretions but also notes that when her diet is better, swelling improves.  Total time of encounter: 30 minutes total time of encounter, including 20 minutes spent in face-to-face patient care on the date of this encounter. This time includes coordination of care and counseling regarding above mentioned problem list. Remainder of non-face-to-face time involved reviewing chart documents/testing relevant to the patient encounter and documentation in the medical record. I have independently reviewed documentation from referring provider.   Weston Brass, MD, [facility]  [facility]   Medication Adjustments/Labs and Tests Ordered: Current  medicines are reviewed at length with the patient today.  Concerns regarding medicines are outlined above.   Orders Placed This Encounter  Procedures   EKG 12-Lead    No orders of the defined types were placed in this encounter.     Patient Instructions  Medication Instructions:  No Changes In Medications at this time.  *If you need a refill on your cardiac medications before your next appointment, please call your pharmacy*  Follow-Up: At [facility], you and your health needs are our priority.  As part of our continuing mission to provide you with exceptional heart care, we have created designated Provider Care Teams.  These Care Teams include your primary Cardiologist (physician) and Advanced Practice Providers (APPs -  Physician Assistants and Nurse Practitioners) who all work together to provide you with the care you need, when you need it.  Your next appointment:   6 month(s)  Provider:   Parke Poisson, MD

## 2022-07-12 NOTE — Patient Instructions (Signed)
Medication Instructions:  No Changes In Medications at this time.  *If you need a refill on your cardiac medications before your next appointment, please call your pharmacy*  Follow-Up: At [facility], you and your health needs are our priority.  As part of our continuing mission to provide you with exceptional heart care, we have created designated Provider Care Teams.  These Care Teams include your primary Cardiologist (physician) and Advanced Practice Providers (APPs -  Physician Assistants and Nurse Practitioners) who all work together to provide you with the care you need, when you need it.  Your next appointment:   6 month(s)  Provider:   Gayatri A Acharya, MD    

## 2022-07-16 ENCOUNTER — Ambulatory Visit (HOSPITAL_COMMUNITY): Payer: PPO

## 2022-07-16 DIAGNOSIS — J4550 Severe persistent asthma, uncomplicated: Secondary | ICD-10-CM | POA: Diagnosis not present

## 2022-07-16 DIAGNOSIS — D7219 Other eosinophilia: Secondary | ICD-10-CM | POA: Diagnosis not present

## 2022-07-26 DIAGNOSIS — D509 Iron deficiency anemia, unspecified: Secondary | ICD-10-CM | POA: Diagnosis not present

## 2022-07-26 DIAGNOSIS — R252 Cramp and spasm: Secondary | ICD-10-CM | POA: Diagnosis not present

## 2022-07-26 DIAGNOSIS — E538 Deficiency of other specified B group vitamins: Secondary | ICD-10-CM | POA: Diagnosis not present

## 2022-07-26 DIAGNOSIS — E1122 Type 2 diabetes mellitus with diabetic chronic kidney disease: Secondary | ICD-10-CM | POA: Diagnosis not present

## 2022-07-27 LAB — LAB REPORT - SCANNED
A1c: 6.5
Albumin, Urine POC: 11.6
Creatinine, POC: 232 mg/dL
EGFR: 40
Microalb Creat Ratio: 5

## 2022-07-28 DIAGNOSIS — R718 Other abnormality of red blood cells: Secondary | ICD-10-CM | POA: Diagnosis not present

## 2022-07-28 DIAGNOSIS — I129 Hypertensive chronic kidney disease with stage 1 through stage 4 chronic kidney disease, or unspecified chronic kidney disease: Secondary | ICD-10-CM | POA: Diagnosis not present

## 2022-07-28 DIAGNOSIS — N2581 Secondary hyperparathyroidism of renal origin: Secondary | ICD-10-CM | POA: Diagnosis not present

## 2022-07-28 DIAGNOSIS — N1832 Chronic kidney disease, stage 3b: Secondary | ICD-10-CM | POA: Diagnosis not present

## 2022-07-30 DIAGNOSIS — E1122 Type 2 diabetes mellitus with diabetic chronic kidney disease: Secondary | ICD-10-CM | POA: Diagnosis not present

## 2022-07-30 DIAGNOSIS — N1832 Chronic kidney disease, stage 3b: Secondary | ICD-10-CM | POA: Diagnosis not present

## 2022-07-30 DIAGNOSIS — R600 Localized edema: Secondary | ICD-10-CM | POA: Diagnosis not present

## 2022-07-30 DIAGNOSIS — E1140 Type 2 diabetes mellitus with diabetic neuropathy, unspecified: Secondary | ICD-10-CM | POA: Diagnosis not present

## 2022-07-30 DIAGNOSIS — I5032 Chronic diastolic (congestive) heart failure: Secondary | ICD-10-CM | POA: Diagnosis not present

## 2022-07-30 DIAGNOSIS — K449 Diaphragmatic hernia without obstruction or gangrene: Secondary | ICD-10-CM | POA: Diagnosis not present

## 2022-07-30 DIAGNOSIS — J449 Chronic obstructive pulmonary disease, unspecified: Secondary | ICD-10-CM | POA: Diagnosis not present

## 2022-07-30 DIAGNOSIS — K219 Gastro-esophageal reflux disease without esophagitis: Secondary | ICD-10-CM | POA: Diagnosis not present

## 2022-07-30 DIAGNOSIS — M1990 Unspecified osteoarthritis, unspecified site: Secondary | ICD-10-CM | POA: Diagnosis not present

## 2022-07-30 DIAGNOSIS — G5603 Carpal tunnel syndrome, bilateral upper limbs: Secondary | ICD-10-CM | POA: Diagnosis not present

## 2022-07-30 DIAGNOSIS — I130 Hypertensive heart and chronic kidney disease with heart failure and stage 1 through stage 4 chronic kidney disease, or unspecified chronic kidney disease: Secondary | ICD-10-CM | POA: Diagnosis not present

## 2022-07-30 DIAGNOSIS — G629 Polyneuropathy, unspecified: Secondary | ICD-10-CM | POA: Diagnosis not present

## 2022-08-03 ENCOUNTER — Encounter: Payer: Self-pay | Admitting: Internal Medicine

## 2022-08-03 DIAGNOSIS — M65321 Trigger finger, right index finger: Secondary | ICD-10-CM | POA: Diagnosis not present

## 2022-08-03 DIAGNOSIS — M25521 Pain in right elbow: Secondary | ICD-10-CM | POA: Diagnosis not present

## 2022-08-12 DIAGNOSIS — J449 Chronic obstructive pulmonary disease, unspecified: Secondary | ICD-10-CM | POA: Diagnosis not present

## 2022-08-12 DIAGNOSIS — M5030 Other cervical disc degeneration, unspecified cervical region: Secondary | ICD-10-CM | POA: Diagnosis not present

## 2022-08-12 DIAGNOSIS — Z87891 Personal history of nicotine dependence: Secondary | ICD-10-CM | POA: Diagnosis not present

## 2022-08-12 DIAGNOSIS — D631 Anemia in chronic kidney disease: Secondary | ICD-10-CM | POA: Diagnosis not present

## 2022-08-12 DIAGNOSIS — E785 Hyperlipidemia, unspecified: Secondary | ICD-10-CM | POA: Diagnosis not present

## 2022-08-12 DIAGNOSIS — E1122 Type 2 diabetes mellitus with diabetic chronic kidney disease: Secondary | ICD-10-CM | POA: Diagnosis not present

## 2022-08-12 DIAGNOSIS — K5900 Constipation, unspecified: Secondary | ICD-10-CM | POA: Diagnosis not present

## 2022-08-12 DIAGNOSIS — N183 Chronic kidney disease, stage 3 unspecified: Secondary | ICD-10-CM | POA: Diagnosis not present

## 2022-08-12 DIAGNOSIS — Z0389 Encounter for observation for other suspected diseases and conditions ruled out: Secondary | ICD-10-CM | POA: Diagnosis not present

## 2022-08-12 DIAGNOSIS — I129 Hypertensive chronic kidney disease with stage 1 through stage 4 chronic kidney disease, or unspecified chronic kidney disease: Secondary | ICD-10-CM | POA: Diagnosis not present

## 2022-08-12 DIAGNOSIS — Z886 Allergy status to analgesic agent status: Secondary | ICD-10-CM | POA: Diagnosis not present

## 2022-08-13 DIAGNOSIS — H02403 Unspecified ptosis of bilateral eyelids: Secondary | ICD-10-CM | POA: Diagnosis not present

## 2022-08-17 DIAGNOSIS — K5904 Chronic idiopathic constipation: Secondary | ICD-10-CM | POA: Diagnosis not present

## 2022-08-17 DIAGNOSIS — K5900 Constipation, unspecified: Secondary | ICD-10-CM | POA: Diagnosis not present

## 2022-08-17 DIAGNOSIS — B3731 Acute candidiasis of vulva and vagina: Secondary | ICD-10-CM | POA: Diagnosis not present

## 2022-08-25 ENCOUNTER — Other Ambulatory Visit (HOSPITAL_COMMUNITY): Payer: Self-pay | Admitting: Internal Medicine

## 2022-08-25 DIAGNOSIS — Z1231 Encounter for screening mammogram for malignant neoplasm of breast: Secondary | ICD-10-CM

## 2022-08-26 IMAGING — MG MM DIGITAL SCREENING BILAT W/ TOMO AND CAD
8 of 14 series · 8 of 40 positions shown · non-contrast
Comparison: Previous exam(s).

CLINICAL DATA: Screening.

EXAM:
DIGITAL SCREENING BILATERAL MAMMOGRAM WITH TOMOSYNTHESIS AND CAD
TECHNIQUE: Bilateral screening digital craniocaudal and mediolateral oblique
mammograms were obtained. Bilateral screening digital breast
tomosynthesis was performed. The images were evaluated with
computer-aided detection.

[L MLO synth-2D]
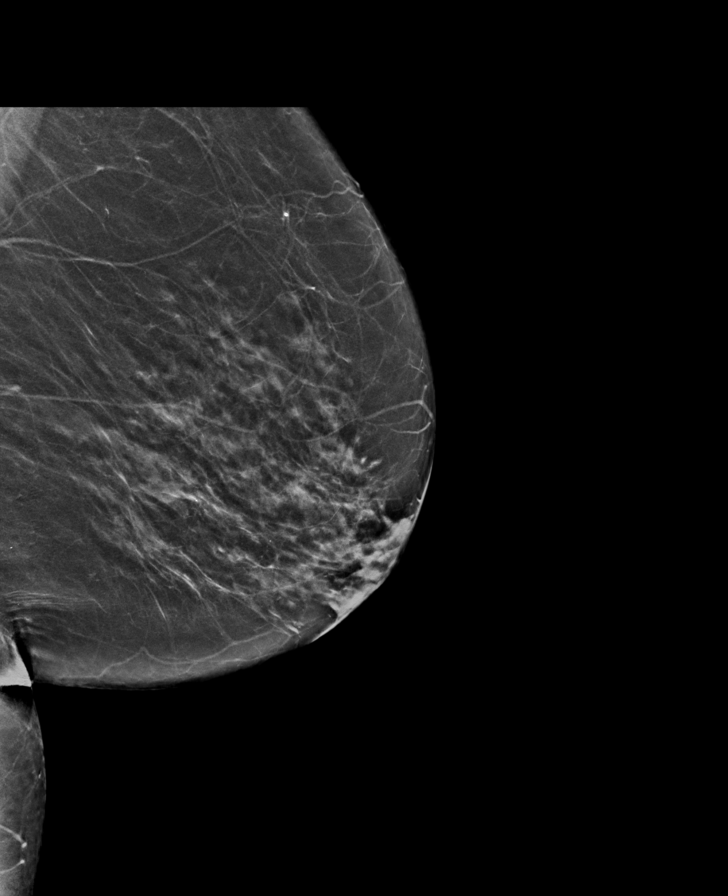

[R CC synth-2D (1 of 2)]
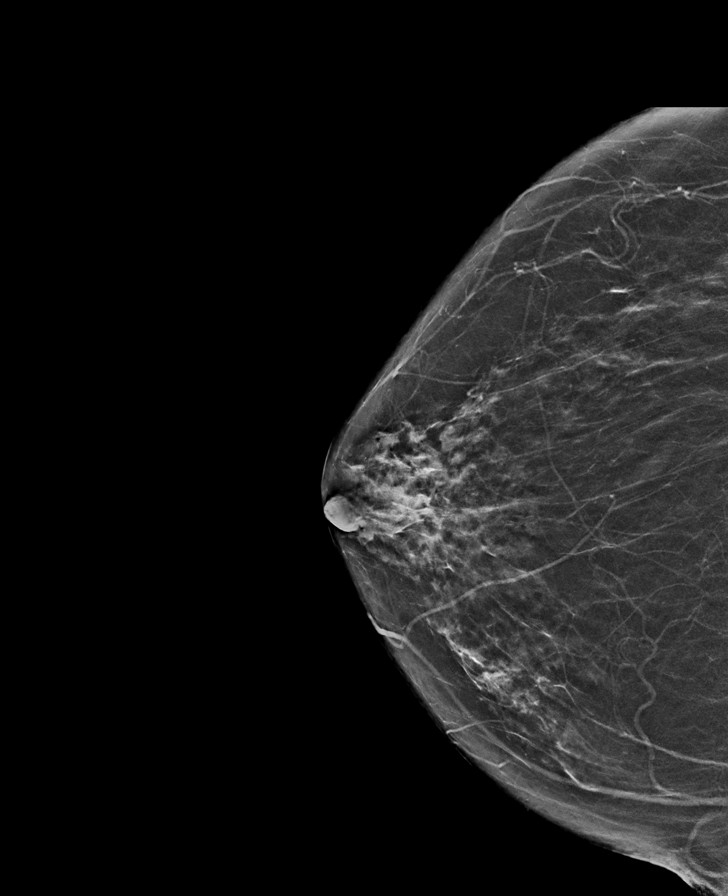

[L CC synth-2D (1 of 2)]
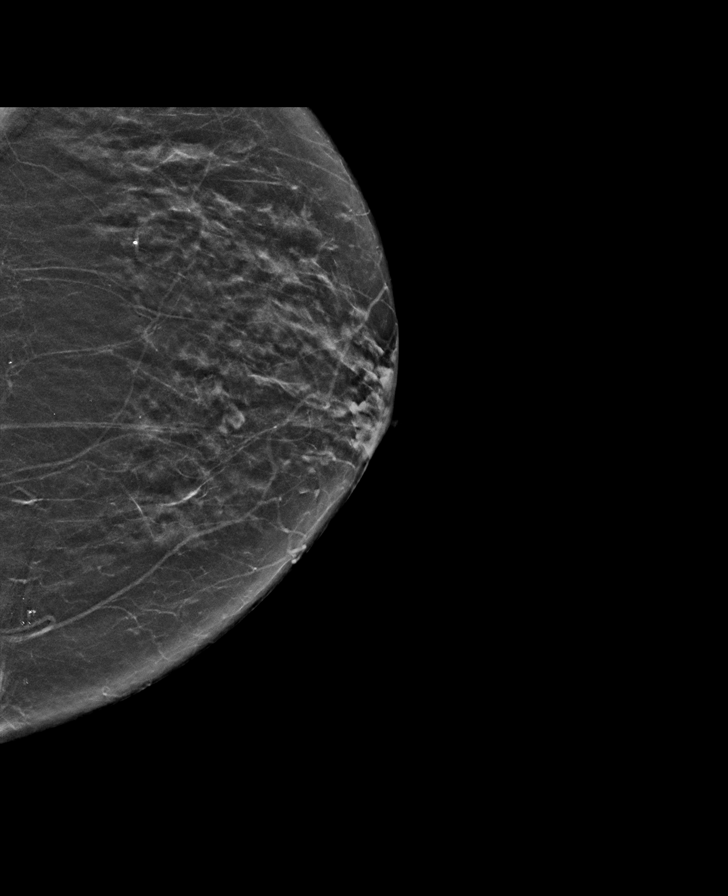

[L CC synth-2D (2 of 2)]
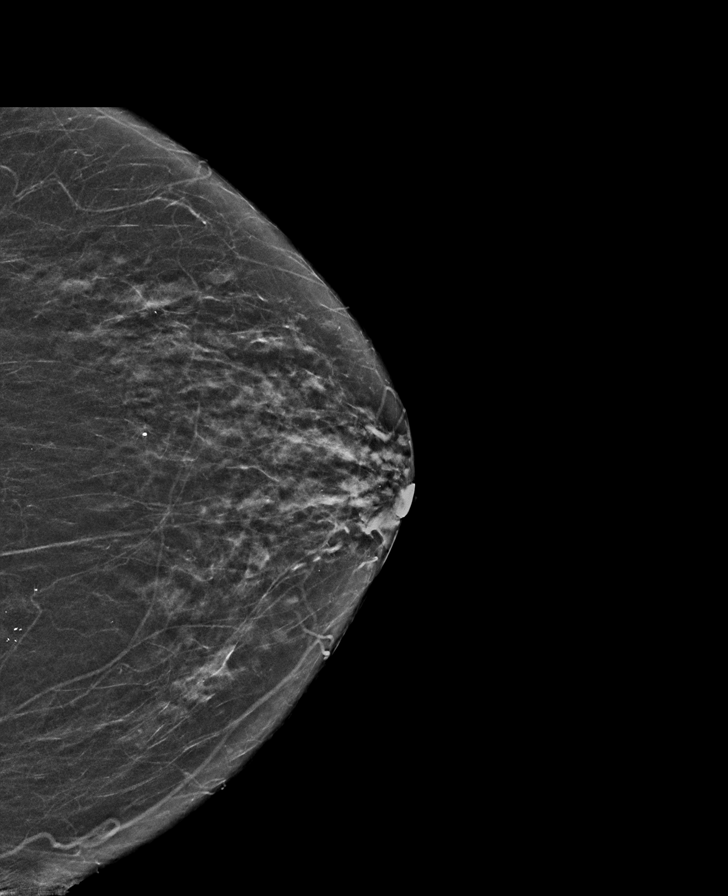

[R CC synth-2D (2 of 2)]
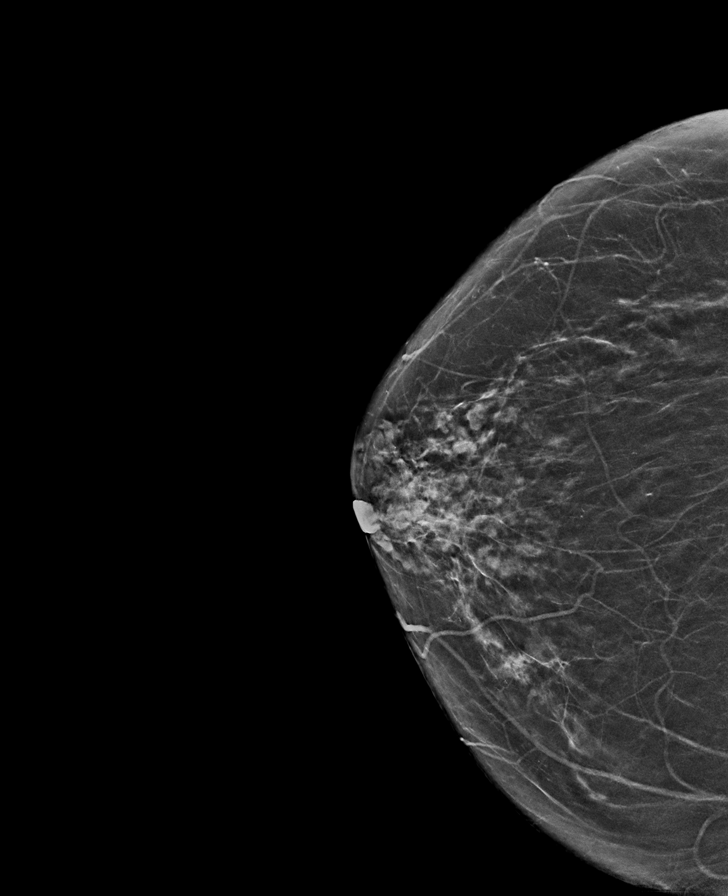

[R MLO synth-2D (1 of 2)]
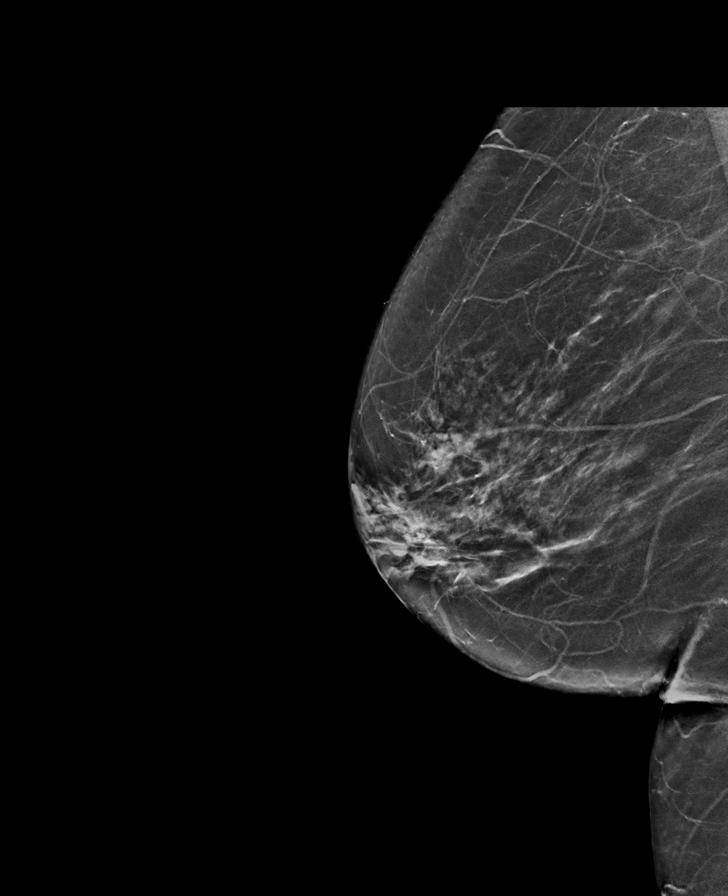

[R MLO synth-2D (2 of 2)]
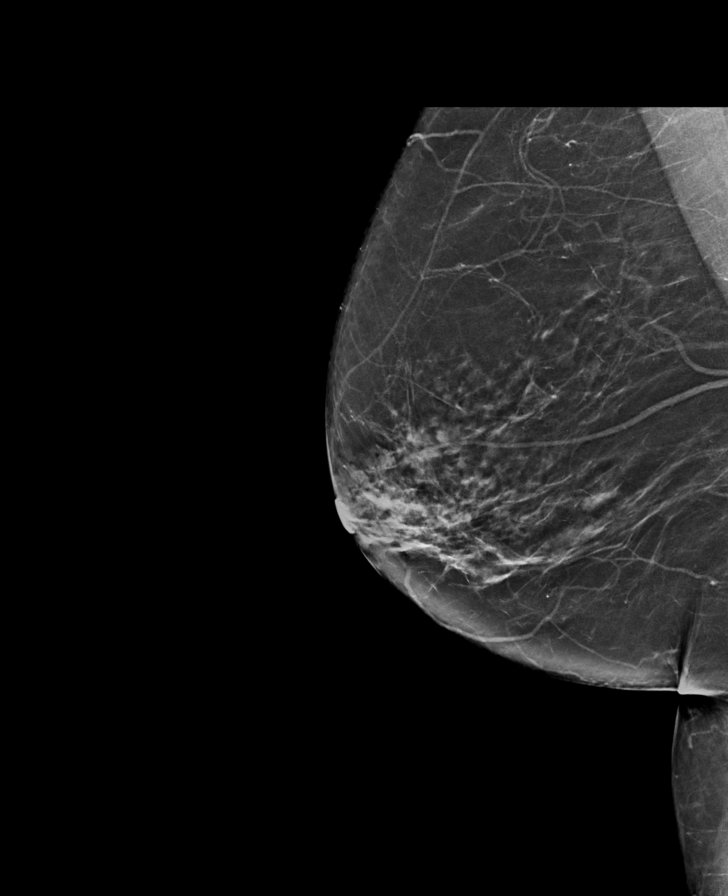

[R MLO tomo · tomo slice 33/64.0]
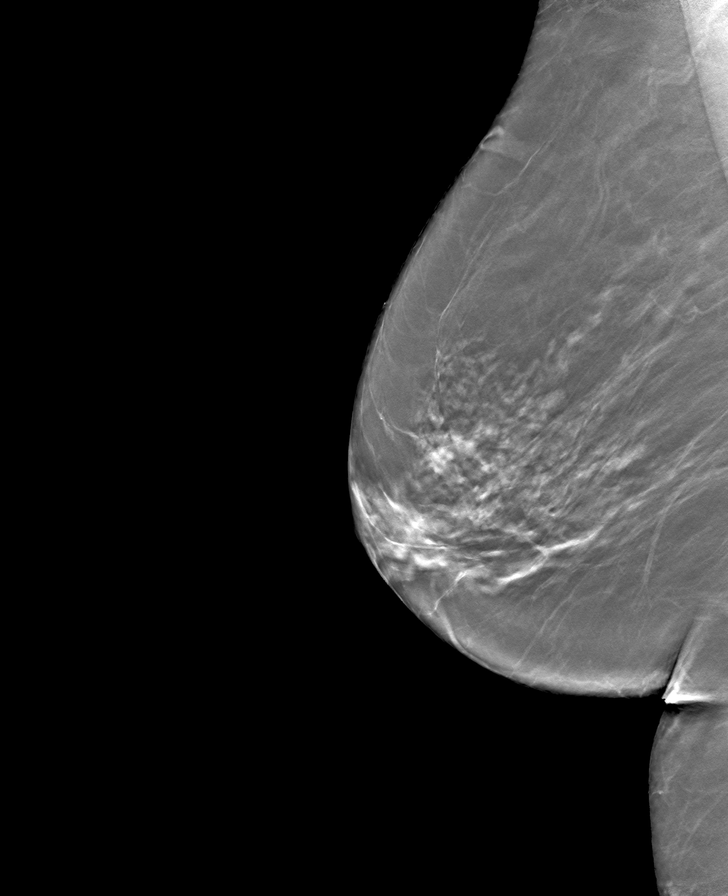

[8 of 40 positions shown; findings below may reference images not displayed]

ACR Breast Density Category b: There are scattered areas of
fibroglandular density.
FINDINGS: There are no findings suspicious for malignancy.
IMPRESSION: No mammographic evidence of malignancy. A result letter of this
screening mammogram will be mailed directly to the patient.

RECOMMENDATION:
Screening mammogram in one year. (Code:51-O-LD2)

BI-RADS CATEGORY  1: Negative.

## 2022-09-03 ENCOUNTER — Ambulatory Visit (HOSPITAL_COMMUNITY): Payer: PPO

## 2022-09-06 ENCOUNTER — Encounter (HOSPITAL_COMMUNITY)
Admission: RE | Admit: 2022-09-06 | Discharge: 2022-09-06 | Disposition: A | Discharge status: Home / Self Care | Payer: PPO | Source: Ambulatory Visit | Attending: Internal Medicine | Admitting: Internal Medicine

## 2022-09-06 VITALS — BP 128/69 | HR 77 | Temp 97.6°F | Resp 16

## 2022-09-06 DIAGNOSIS — M810 Age-related osteoporosis without current pathological fracture: Secondary | ICD-10-CM | POA: Insufficient documentation

## 2022-09-06 MED ORDER — DENOSUMAB 60 MG/ML ~~LOC~~ SOSY
60.0000 mg | PREFILLED_SYRINGE | Freq: Once | SUBCUTANEOUS | Status: AC
  Administered 2022-09-06: 60 mg via SUBCUTANEOUS

## 2022-09-06 NOTE — Progress Notes (Signed)
Diagnosis: Osteoporosis  Provider:  Zack Hall MD  Procedure: Injection  Prolia (Denosumab), Dose: 60 mg, Site: subcutaneous, Number of injections: 1  Post Care: Patient declined observation  Discharge: Condition: Good, Destination: Home . AVS provided to patient.   Performed by:  Shantanu Strauch E Bindu Docter, RN        

## 2022-09-06 NOTE — Progress Notes (Unsigned)
History of Present Illness: Glenda Terry is a 82 y.o. female who presents today as a new patient at [facility]. All available relevant medical records have been reviewed. She is accompanied by her [name], who is a Designer, jewellery.  Recent relevant results: - 06/11/2022: CT abdomen/pelvis w/ contrast showed no GU stones, masses, or hydronephrosis. - 07/27/2022: Hemoglobin A1c = 6.5. - 08/12/2022: Normal urinalysis & urine microscopy.  - No positive urine culture results in past 12 months found per chart review.  Today: She reports chief complaint of significantly bothersome urinary urgency, frequency, and nocturia. She voids very frequently during the day and almost hourly at night. This has been going on for quite some time. She denies urge incontinence. Denies caffeine intake. Does double voiding already.  She denies dysuria, gross hematuria, straining to void, or sensations of incomplete emptying. Denies history of kidney stones. She is a former smoker; quite >25 years ago.  She denies ever having a sleep study but states that her pulmonologist told her she is not thought to have obstructive sleep apnea. Interesting aside from COPD she is followed routinely by pulmonology due to history of eosinophilic asthma; she is taking Benadryl, Singulair, and Allegra daily to manage her histamine.  She reports stress incontinence with cough, primarily if she is having a bad asthma attack. She wears pull-ups just in case and denies significant bother due to urinary incontinence.   Fall Screening: Do you usually have a device to assist in your mobility? No   Medications: Current Outpatient Medications  Medication Sig Dispense Refill   acetaminophen (TYLENOL) 500 MG tablet Take 1,000 mg by mouth every 6 (six) hours as needed for moderate pain.     albuterol (PROAIR HFA) 108 (90 Base) MCG/ACT inhaler Inhale 2 puffs into the lungs every 6 (six) hours as needed for  wheezing or shortness of breath (For COPD).      ALPRAZolam (XANAX) 0.5 MG tablet Take 0.5 tablets (0.25 mg total) by mouth 2 (two) times daily as needed for anxiety. 10 tablet 0   amLODipine (NORVASC) 5 MG tablet TAKE ONE TABLET BY MOUTH ONCE DAILY. 90 tablet 0   Benralizumab 30 MG/ML SOSY Inject 30 mg into the skin See admin instructions. Inject 30 mg into the skin every 56 Days     calcium carbonate (OS-CAL) 600 MG TABS tablet Take 600 mg by mouth 2 (two) times daily with a meal.     carvedilol (COREG) 6.25 MG tablet TAKE (1) TABLET BY MOUTH TWICE DAILY. 60 tablet 3   Cholecalciferol (VITAMIN D3) 50 MCG (2000 UT) CAPS Take 2,000 Units by mouth daily.     cyanocobalamin 2000 MCG tablet Take 2,000 mcg by mouth daily.     denosumab (PROLIA) 60 MG/ML SOSY injection Inject 60 mg into the skin every 6 (six) months.     diphenhydrAMINE (BENADRYL) 25 mg capsule Take 25 mg by mouth at bedtime.     DULERA 200-5 MCG/ACT AERO Inhale 2 puffs into the lungs 2 (two) times daily. 8.8 g 0   ezetimibe (ZETIA) 10 MG tablet Take 10 mg by mouth daily.     ferrous sulfate 325 (65 FE) MG tablet Take 1 tablet (325 mg total) by mouth daily with breakfast. 30 tablet 0   fexofenadine (ALLEGRA) 180 MG tablet Take 180 mg by mouth daily.     fluticasone (FLONASE) 50 MCG/ACT nasal spray Place 1 spray into both nostrils daily as needed for allergies or rhinitis.  ipratropium-albuterol (DUONEB) 0.5-2.5 (3) MG/3ML SOLN Take 3 mLs by nebulization every 4 (four) hours as needed (shortness of breath).     ketotifen (ZADITOR) 0.025 % ophthalmic solution Place 1 drop into both eyes 2 (two) times daily as needed (allergies). 5 mL 0   mirabegron ER (MYRBETRIQ) 25 MG TB24 tablet Take 1 tablet (25 mg total) by mouth daily. 30 tablet 11   montelukast (SINGULAIR) 10 MG tablet Take 1 tablet (10 mg total) by mouth at bedtime. 90 tablet 2   Oxycodone HCl 10 MG TABS Take 10 mg by mouth every 6 (six) hours as needed (pain).      pantoprazole (PROTONIX) 40 MG tablet Take 1 tablet (40 mg total) by mouth 2 (two) times daily. 60 tablet 11   Polyethyl Glycol-Propyl Glycol (SYSTANE OP) Place 1 drop into both eyes 3 (three) times daily as needed (dry/irritated eyes.).      polyethylene glycol (MIRALAX / GLYCOLAX) 17 g packet Take 17 g by mouth daily. 14 each 0   potassium chloride (KLOR-CON) 10 MEQ tablet Take 40 meq(4 tab) on Monday, Wednesday and Friday, all other days take 20 meq(2 tab) by mouth daily, Take additional 64meq(2tab) today, and 77meq(6tab) x3days then back to regular dosing 360 tablet 3   pravastatin (PRAVACHOL) 10 MG tablet Take 10 mg by mouth daily.     promethazine (PHENERGAN) 12.5 MG tablet Take 0.5 tablets (6.25 mg total) by mouth every 8 (eight) hours as needed for nausea or vomiting. 10 tablet 0   torsemide (DEMADEX) 20 MG tablet TAKE 2 TABLETS BY MOUTH ON MONDAY, WEDNESDAY, AND FRIDAY. ALLOTHER DAYS TAKE 1 TABLET DAILY. 128 tablet 1   vitamin C (ASCORBIC ACID) 500 MG tablet Take 500 mg by mouth daily.     No current facility-administered medications for this visit.    Allergies: Allergies  Allergen Reactions   Flexeril [Cyclobenzaprine] Anaphylaxis   Other Anaphylaxis and Other (See Comments)    ALL TREE NUTS   Keflex [Cephalexin] Itching   Aspirin Other (See Comments)    Cannot take due to ulcers   Fentanyl Swelling    Lip Swelling   Hydrocodone    Adhesive [Tape] Rash   Chlorhexidine Rash    Past Medical History:  Diagnosis Date   Anxiety    Asthma    Back pain, chronic    CKD (chronic kidney disease) stage 3, GFR 30-59 ml/min (HCC)    Compression fx, thoracic spine (HCC)    T - 11   COPD (chronic obstructive pulmonary disease) (HCC) 12/16/2015   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HCC)    Diverticulitis    Essential hypertension    GERD (gastroesophageal reflux disease)    HOH (hard of hearing)    Inflammatory arthritis    Iron deficiency anemia 01/2012   Nausea with vomiting 01/14/2020    Pulmonary eosinophilia (HCC)    Spinal stenosis    Tubular adenoma 11/2012   Past Surgical History:  Procedure Laterality Date   ABDOMINAL HYSTERECTOMY     BACK SURGERY     CARPAL TUNNEL RELEASE Right 06/2012   CATARACT EXTRACTION W/PHACO Left 11/19/2014   Procedure: CATARACT EXTRACTION PHACO AND INTRAOCULAR LENS PLACEMENT (IOC);  Surgeon: Susa Simmonds, MD;  Location: [facility];  Service: Ophthalmology;  Laterality: Left;  CDE: 4.77   CATARACT EXTRACTION W/PHACO Right 02/03/2015   Procedure: CATARACT EXTRACTION PHACO AND INTRAOCULAR LENS PLACEMENT (IOC);  Surgeon: Susa Simmonds, MD;  Location: [facility];  Service: Ophthalmology;  Laterality: Right;  CDE: 4.54   COLONOSCOPY  06/19/2008   RMR: tortuous and elongated colon with scattered left-sided diverticula/colonic mucosa appeared entirely normal. Prior colonic ulcers had resolved.   COLONOSCOPY  12/2007   Dr. Jena Gauss: Scattered diffuse sigmoid diverticula, 2 areas of ulceration at the hepatic flexure. Biopsies unremarkable.   COLONOSCOPY N/A 11/20/2012   next TCS 11/2017   COLONOSCOPY WITH PROPOFOL N/A 12/22/2017   Procedure: COLONOSCOPY WITH PROPOFOL;  Surgeon: Corbin Ade, MD; diverticula in the sigmoid and descending colon and 2 tubular adenomas.  No recommendations to repeat.     DECOMPRESSIVE LUMBAR LAMINECTOMY LEVEL 2  04/20/2012   Procedure: DECOMPRESSIVE LUMBAR LAMINECTOMY LEVEL 2;  Surgeon: Jacki Cones, MD;  Location: [facility];  Service: Orthopedics;  Laterality: Right;  Decompressive Lumbar Laminectomy of the L4 - L5 and L5 - S1 Complete/Laminectomy L5 on the Right (X-Ray)   ESOPHAGOGASTRODUODENOSCOPY  12/2007   Dr. 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 Small hiatal hernia. Slightly pale duodenal mucosa (biopsy unremarkable)   ESOPHAGOGASTRODUODENOSCOPY (EGD) WITH PROPOFOL N/A 01/20/2017   Dr. Jena Gauss: Medium sized hiatal hernia empiric esophageal dilation for history of  dysphagia   ESOPHAGOGASTRODUODENOSCOPY (EGD) WITH PROPOFOL N/A 04/08/2020   Surgeon: Lanelle Bal, DO;  medium size hiatal hernia 3 to 4 cm chicken bone lodged in the gastric antrum just proximal to the pylorus removed with a snare with mild bleeding at the site of lodged bone.  Normal examined duodenum.   ESOPHAGOGASTRODUODENOSCOPY (EGD) WITH PROPOFOL N/A 02/10/2022   Procedure: ESOPHAGOGASTRODUODENOSCOPY (EGD) WITH PROPOFOL;  Surgeon: Corbin Ade, MD;  Location: [facility];  Service: Endoscopy;  Laterality: N/A;  8:30 AM   EYE SURGERY     BIL CATARACTS   FOREIGN BODY REMOVAL N/A 04/08/2020   Procedure: FOREIGN BODY REMOVAL;  Surgeon: Lanelle Bal, DO;  Location: [facility];  Service: Endoscopy;  Laterality: N/A;   INCISIONAL HERNIA REPAIR N/A 11/12/2020   Procedure: HERNIA REPAIR INCISIONAL;  Surgeon: Franky Macho, MD;  Location: [facility];  Service: General;  Laterality: N/A;   IR KYPHO THORACIC WITH BONE BIOPSY  02/08/2017   IR RADIOLOGIST EVAL & MGMT  02/02/2017   JOINT REPLACEMENT     KNEE ARTHROSCOPY Right    MALONEY DILATION N/A 01/20/2017   Procedure: MALONEY DILATION;  Surgeon: Corbin Ade, MD;  Location: [facility];  Service: Endoscopy;  Laterality: N/A;   MALONEY DILATION N/A 02/10/2022   Procedure: Elease Hashimoto DILATION;  Surgeon: Corbin Ade, MD;  [facility]: [facility];  Service: Endoscopy;  Laterality: N/A;   POLYPECTOMY  12/22/2017   Procedure: POLYPECTOMY;  Surgeon: Corbin Ade, MD;  [facility]: [facility];  Service: Endoscopy;;   REVERSE SHOULDER ARTHROPLASTY Right 05/24/2014   Procedure: RIGHT SHOULDER REVERSE ARTHROPLASTY;  Surgeon: Beverely Low, MD;  Location: [name];  Service: Orthopedics;  Laterality: Right;   REVERSE SHOULDER ARTHROPLASTY Left 06/10/2017   Procedure: LEFT REVERSE SHOULDER ARTHROPLASTY;  Surgeon: Beverely Low, MD;  Location: [facility] OR;  Service: Orthopedics;  Laterality: Left;   TOTAL KNEE ARTHROPLASTY  01/24/2012    Procedure: TOTAL KNEE ARTHROPLASTY;  Surgeon: Loanne Drilling, MD;  Location: [facility];  Service: Orthopedics;  Laterality: Right;   TOTAL KNEE ARTHROPLASTY Left 10/15/2019   Procedure: TOTAL KNEE ARTHROPLASTY;  Surgeon: Ollen Gross, MD;  Location: [facility];  Service: Orthopedics;  Laterality: Left;    TUBAL LIGATION     Family History  Problem Relation Age of  Onset   Breast cancer Sister    Colon cancer Sister        two deceased, one living and terminal, one current undergoing treatment, ages 56, 55, 25, 61   Hypertension Mother    Stroke Mother    Heart attack Mother    Hypertension Father    Prostate cancer Father    Social History   Socioeconomic History   Marital status: Single    Spouse name: Not on file   Number of children: 3   Years of education: Not on file   Highest education level: Not on file  Occupational History   Not on file  Tobacco Use   Smoking status: Former    Packs/day: 2.00    Years: 40.00    Additional pack years: 0.00    Total pack years: 80.00    Types: Cigarettes    Quit date: 01/19/1992    Years since quitting: 30.6   Smokeless tobacco: Never  Vaping Use   Vaping Use: Never used  Substance and Sexual Activity   Alcohol use: No   Drug use: No   Sexual activity: Not Currently  Other Topics Concern   Not on file  Social History Narrative   Not on file   Social Determinants of Health   Financial Resource Strain: Not on file  Food Insecurity: Not on file  Transportation Needs: Not on file  Physical Activity: Not on file  Stress: Not on file  Social Connections: Not on file  Intimate Partner Violence: Not on file    SUBJECTIVE  Review of Systems Constitutional: Patient denies any unintentional weight loss or change in strength lntegumentary: Patient denies any rashes or pruritus Eyes: Patient reports dry eyes ENT: Patient reports dry mouth  Cardiovascular: Patient denies chest pain or syncope Respiratory: Patient denies  shortness of breath Gastrointestinal: Patient denies nausea, vomiting, constipation, or diarrhea Musculoskeletal: Patient denies muscle cramps or weakness Neurologic: Patient denies convulsions or seizures Psychiatric: Patient denies memory problems Allergic/Immunologic: Patient denies recent allergic reaction(s) Hematologic/Lymphatic: Patient denies bleeding tendencies Endocrine: Patient denies heat/cold intolerance  GU: As per HPI.  OBJECTIVE Vitals:   09/09/22 0944  BP: 138/74  Pulse: 94  Temp: 98.6 F (37 C)   There is no height or weight on file to calculate BMI.  Physical Examination  Constitutional: No obvious distress; patient is non-toxic appearing  Cardiovascular: No visible lower extremity edema.  Respiratory: The patient does not have audible wheezing/stridor; respirations do not appear labored  Gastrointestinal: Abdomen non-distended Musculoskeletal: Normal ROM of UEs  Skin: No obvious rashes/open sores  Neurologic: CN 2-12 grossly intact Psychiatric: Answered questions appropriately with normal affect  Hematologic/Lymphatic/Immunologic: No obvious bruises or sites of spontaneous bleeding  UA: positive for 3-10 RBC/hpf without evidence of UTI PVR: 0 ml  ASSESSMENT Urinary urgency - Plan: mirabegron ER (MYRBETRIQ) 25 MG TB24 tablet  Urinary frequency - Plan: mirabegron ER (MYRBETRIQ) 25 MG TB24 tablet  Nocturia - Plan: Urinalysis, Routine w reflex microscopic, BLADDER SCAN AMB NON-IMAGING, mirabegron ER (MYRBETRIQ) 25 MG TB24 tablet  Neuropathy  Spinal stenosis of lumbar region without neurogenic claudication  Eosinophil count raised  Microscopic hematuria  Former smoker  Prediabetes  1. Urinary frequency and urgency: We discussed the symptoms of overactive bladder (OAB), which include urinary urgency, frequency, nocturia, with or without urge incontinence.   While we may not know the exact etiology of OAB, several risk factors can be identified.   - We discussed this patient's neurogenic risk factors  for OAB-type symptoms including neuropathy, spinal stenosis, prior nicotine use, prediabetes.   We discussed the following management options in detail including potential benefits, risks, and side effects: Behavioral therapy: Decreasing bladder irritants (such as caffeine, acidic foods, spicy foods, alcohol) Double voiding Medication(s): Anticholinergic medications: Not a safe option for her due to age, pre-existing dry eyes / dry mouth, risk for side effects Beta-3 adrenergic agonist medications: We discussed the risk for urinary retention and the potential side effect of elevated blood pressure specific to Myrbetriq (which is more likely to occur in individuals with uncontrolled hypertension). She decided to proceed with Myrbetriq 25 mg daily (generic).  2. Microscopic hematuria: We discussed possible etiologies including but not limited to: vigorous exercise, sexual activity, stone, trauma, blood thinner use, urinary tract infection, urethral irritation secondary to vaginal atrophy, chronic kidney disease, glomerulonephropathy, malignancy. We discussed pt's smoking as a risk factor for GU cancer and encouraged continued smoking cessation.  We reviewed the AUA 2020 [facility] guideline and risk stratification for this patient. Based on individual risk factors. She had a CT abdomen/pelvis w/ contrast in March 2024 with no acute findings. Patient was advised that additional recommended workup includes a cystoscopy for further evaluation.   3. 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 We discussed this as a possibility based on her history of eosinophilic asthma. Will proceed with cystoscopy for further evaluation. Advised to continue the antihistamines and mast cell stabilizer medications which she is already taking.   4. Stress urinary incontinence: Not significantly bohtersome per patient.  Pt decided to pursue this work-up  and follow-up afterward to discuss the results and formulate a treatment plan based on the findings. All questions were answered.   PLAN Advised the following: Start Myrbetriq 25 mg daily. Continue to avoid caffeine intake. Continue double voiding. Return for 1st available cystoscopy with Dr. Ronne Binning.  Orders Placed This Encounter  Procedures   Urinalysis, Routine w reflex microscopic   BLADDER SCAN AMB NON-IMAGING    It has been explained that the patient is to follow regularly with their PCP in addition to all other providers involved in their care and to follow instructions provided by these respective offices. Patient advised to contact urology clinic if any urologic-pertaining questions, concerns, new symptoms or problems arise in the interim period.  There are no Patient Instructions on file for this visit.  Electronically signed by:  Donnita Falls, MSN, FNP-C, CUNP 09/09/2022 1:20 PM

## 2022-09-06 NOTE — Addendum Note (Signed)
Encounter addended by: Miriam Kestler E, RN on: 09/06/2022 11:18 AM  Actions taken: Therapy plan modified

## 2022-09-07 ENCOUNTER — Telehealth: Payer: Self-pay

## 2022-09-07 NOTE — Telephone Encounter (Signed)
Patient left voicemail inquiring about urology referral. Message sent to urology referral coordinator.

## 2022-09-08 DIAGNOSIS — M48061 Spinal stenosis, lumbar region without neurogenic claudication: Secondary | ICD-10-CM | POA: Diagnosis not present

## 2022-09-08 DIAGNOSIS — T402X5A Adverse effect of other opioids, initial encounter: Secondary | ICD-10-CM | POA: Diagnosis not present

## 2022-09-08 DIAGNOSIS — M5459 Other low back pain: Secondary | ICD-10-CM | POA: Diagnosis not present

## 2022-09-08 DIAGNOSIS — M5136 Other intervertebral disc degeneration, lumbar region: Secondary | ICD-10-CM | POA: Diagnosis not present

## 2022-09-08 DIAGNOSIS — G894 Chronic pain syndrome: Secondary | ICD-10-CM | POA: Diagnosis not present

## 2022-09-08 DIAGNOSIS — Z5181 Encounter for therapeutic drug level monitoring: Secondary | ICD-10-CM | POA: Diagnosis not present

## 2022-09-08 DIAGNOSIS — Z79899 Other long term (current) drug therapy: Secondary | ICD-10-CM | POA: Diagnosis not present

## 2022-09-08 DIAGNOSIS — M961 Postlaminectomy syndrome, not elsewhere classified: Secondary | ICD-10-CM | POA: Diagnosis not present

## 2022-09-09 ENCOUNTER — Encounter: Payer: Self-pay | Admitting: Urology

## 2022-09-09 ENCOUNTER — Ambulatory Visit (INDEPENDENT_AMBULATORY_CARE_PROVIDER_SITE_OTHER): Payer: PPO | Admitting: Urology

## 2022-09-09 VITALS — BP 138/74 | HR 94 | Temp 98.6°F

## 2022-09-09 DIAGNOSIS — R7303 Prediabetes: Secondary | ICD-10-CM

## 2022-09-09 DIAGNOSIS — R350 Frequency of micturition: Secondary | ICD-10-CM

## 2022-09-09 DIAGNOSIS — R3129 Other microscopic hematuria: Secondary | ICD-10-CM

## 2022-09-09 DIAGNOSIS — M48061 Spinal stenosis, lumbar region without neurogenic claudication: Secondary | ICD-10-CM

## 2022-09-09 DIAGNOSIS — G629 Polyneuropathy, unspecified: Secondary | ICD-10-CM

## 2022-09-09 DIAGNOSIS — R351 Nocturia: Secondary | ICD-10-CM

## 2022-09-09 DIAGNOSIS — R3915 Urgency of urination: Secondary | ICD-10-CM | POA: Diagnosis not present

## 2022-09-09 DIAGNOSIS — R898 Other abnormal findings in specimens from other organs, systems and tissues: Secondary | ICD-10-CM

## 2022-09-09 DIAGNOSIS — Z87891 Personal history of nicotine dependence: Secondary | ICD-10-CM

## 2022-09-09 LAB — URINALYSIS, ROUTINE W REFLEX MICROSCOPIC
Bilirubin, UA: NEGATIVE
Glucose, UA: NEGATIVE
Ketones, UA: NEGATIVE
Leukocytes,UA: NEGATIVE
Nitrite, UA: NEGATIVE
Protein,UA: NEGATIVE
Specific Gravity, UA: 1.015 (ref 1.005–1.030)
Urobilinogen, Ur: 1 mg/dL (ref 0.2–1.0)
pH, UA: 6 (ref 5.0–7.5)

## 2022-09-09 LAB — BLADDER SCAN AMB NON-IMAGING: Scan Result: 0

## 2022-09-09 LAB — MICROSCOPIC EXAMINATION: Epithelial Cells (non renal): >10 /hpf — AB (ref 0–10)

## 2022-09-09 MED ORDER — MIRABEGRON ER 25 MG PO TB24: 25.0000 mg | ORAL_TABLET | Freq: Every day | ORAL | 11 refills | Status: DC

## 2022-09-09 NOTE — Progress Notes (Signed)
Pt here today for bladder scan. Bladder was scanned and 0 was visualized.    Performed by Rubyann Lingle, CMA  

## 2022-09-10 DIAGNOSIS — J4550 Severe persistent asthma, uncomplicated: Secondary | ICD-10-CM | POA: Diagnosis not present

## 2022-09-10 DIAGNOSIS — D7219 Other eosinophilia: Secondary | ICD-10-CM | POA: Diagnosis not present

## 2022-09-13 ENCOUNTER — Ambulatory Visit (HOSPITAL_COMMUNITY): Payer: PPO

## 2022-09-15 ENCOUNTER — Other Ambulatory Visit: Payer: PPO | Admitting: Urology

## 2022-09-15 ENCOUNTER — Other Ambulatory Visit: Payer: Self-pay | Admitting: Cardiology

## 2022-09-20 ENCOUNTER — Ambulatory Visit (HOSPITAL_COMMUNITY)
Admission: RE | Admit: 2022-09-20 | Discharge: 2022-09-20 | Disposition: A | Discharge status: Home / Self Care | Payer: PPO | Source: Ambulatory Visit | Attending: Internal Medicine | Admitting: Internal Medicine

## 2022-09-20 DIAGNOSIS — M542 Cervicalgia: Secondary | ICD-10-CM | POA: Diagnosis not present

## 2022-09-20 DIAGNOSIS — Z87311 Personal history of (healed) other pathological fracture: Secondary | ICD-10-CM | POA: Diagnosis not present

## 2022-09-20 DIAGNOSIS — R252 Cramp and spasm: Secondary | ICD-10-CM | POA: Diagnosis not present

## 2022-09-20 DIAGNOSIS — R600 Localized edema: Secondary | ICD-10-CM | POA: Diagnosis not present

## 2022-09-20 DIAGNOSIS — Z1231 Encounter for screening mammogram for malignant neoplasm of breast: Secondary | ICD-10-CM | POA: Diagnosis not present

## 2022-09-20 DIAGNOSIS — M810 Age-related osteoporosis without current pathological fracture: Secondary | ICD-10-CM | POA: Diagnosis not present

## 2022-09-20 DIAGNOSIS — G5603 Carpal tunnel syndrome, bilateral upper limbs: Secondary | ICD-10-CM | POA: Diagnosis not present

## 2022-09-20 DIAGNOSIS — M1990 Unspecified osteoarthritis, unspecified site: Secondary | ICD-10-CM | POA: Diagnosis not present

## 2022-09-20 DIAGNOSIS — N3281 Overactive bladder: Secondary | ICD-10-CM | POA: Diagnosis not present

## 2022-09-20 DIAGNOSIS — I10 Essential (primary) hypertension: Secondary | ICD-10-CM | POA: Diagnosis not present

## 2022-09-20 DIAGNOSIS — G629 Polyneuropathy, unspecified: Secondary | ICD-10-CM | POA: Diagnosis not present

## 2022-09-20 DIAGNOSIS — M797 Fibromyalgia: Secondary | ICD-10-CM | POA: Diagnosis not present

## 2022-09-25 DIAGNOSIS — K068 Other specified disorders of gingiva and edentulous alveolar ridge: Secondary | ICD-10-CM | POA: Diagnosis not present

## 2022-09-25 DIAGNOSIS — M1990 Unspecified osteoarthritis, unspecified site: Secondary | ICD-10-CM | POA: Diagnosis not present

## 2022-09-25 DIAGNOSIS — M797 Fibromyalgia: Secondary | ICD-10-CM | POA: Diagnosis not present

## 2022-09-29 ENCOUNTER — Other Ambulatory Visit: Payer: Self-pay

## 2022-10-07 ENCOUNTER — Ambulatory Visit: Payer: PPO | Admitting: Gastroenterology

## 2022-10-07 DIAGNOSIS — M542 Cervicalgia: Secondary | ICD-10-CM | POA: Diagnosis not present

## 2022-10-07 DIAGNOSIS — M546 Pain in thoracic spine: Secondary | ICD-10-CM | POA: Diagnosis not present

## 2022-10-12 ENCOUNTER — Other Ambulatory Visit (HOSPITAL_COMMUNITY): Payer: Self-pay | Admitting: Family Medicine

## 2022-10-12 DIAGNOSIS — M546 Pain in thoracic spine: Secondary | ICD-10-CM

## 2022-10-14 ENCOUNTER — Ambulatory Visit (INDEPENDENT_AMBULATORY_CARE_PROVIDER_SITE_OTHER): Payer: PPO | Admitting: Gastroenterology

## 2022-10-14 ENCOUNTER — Encounter: Payer: Self-pay | Admitting: Gastroenterology

## 2022-10-14 VITALS — BP 119/70 | HR 77 | Temp 98.0°F | Ht 60.0 in | Wt 174.9 lb

## 2022-10-14 DIAGNOSIS — K5909 Other constipation: Secondary | ICD-10-CM | POA: Diagnosis not present

## 2022-10-14 DIAGNOSIS — K219 Gastro-esophageal reflux disease without esophagitis: Secondary | ICD-10-CM

## 2022-10-14 NOTE — Patient Instructions (Signed)
Take Miralax one capful every other day as you are doing. However, you can increase this to every day if you find you are not having a good bowel movement on any given day.   You can decrease pantoprazole to once per day. If your reflux isn't controlled with this, you can increase back to twice a day.  Please call with any concerns!  I am so glad you had a wonderful 82th birthday!  I look forward to seeing the sweet baby Randon Goldsmith when you return!  I enjoyed seeing you again today! I value our relationship and want to provide genuine, compassionate, and quality care. You may receive a survey regarding your visit with me, and I welcome your feedback! Thanks so much for taking the time to complete this. I look forward to seeing you again.      Gelene Mink, PhD, ANP-BC [facility]

## 2022-10-14 NOTE — Progress Notes (Signed)
Gastroenterology Office Note     Primary Care Physician:  Benita Stabile, MD  Primary Gastroenterologist: Dr. Jena Gauss    Chief Complaint   Chief Complaint  Patient presents with   Follow-up    Doing well, no issues     History of Present Illness   Glenda Terry is an 82 y.o. female presenting today with a history of GERD and constipation for routine follow-up. She was last seen in April 2024 with reports of gas.    Has done well with low FODMAP diet. Gas much improved. Uses gas-x as needed.  Impaction 08/12/22 and seen with [facility]. She was manually disimpacted. Hemorrhoids had flared. Now resolved. States she had missed some doses of Miralax. Taking now every other day with BM daily.   Pantoprazole BID. GERD controlled. Feels she could possibly decrease to once daily.   Just had her 82th birthday with a large celebration here locally. Entire family came.   Nov 2023 EGD/Dilation: Normal esophagus. Dilated.                           - Large hiatal hernia.                           - Normal duodenal bulb and second portion of the                            duodenum.                           - No specimens collected. Acid suppression regimen                            recently changed to Protonix twice daily   Colooscopy 2019: adenomas, diverticula. No surveillance due to age.   Past Medical History:  Diagnosis Date   Anxiety    Asthma    Back pain, chronic    CKD (chronic kidney disease) stage 3, GFR 30-59 ml/min (HCC)    Compression fx, thoracic spine (HCC)    T - 11   COPD (chronic obstructive pulmonary disease) (HCC) 12/16/2015   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HCC)    Diverticulitis    Essential hypertension    GERD (gastroesophageal reflux disease)    HOH (hard of hearing)    Inflammatory arthritis    Iron deficiency anemia 01/2012   Nausea with vomiting 01/14/2020   Pulmonary eosinophilia (HCC)    Spinal stenosis    Tubular adenoma 11/2012    Past  Surgical History:  Procedure Laterality Date   ABDOMINAL HYSTERECTOMY     BACK SURGERY     CARPAL TUNNEL RELEASE Right 06/2012   CATARACT EXTRACTION W/PHACO Left 11/19/2014   Procedure: CATARACT EXTRACTION PHACO AND INTRAOCULAR LENS PLACEMENT (IOC);  Surgeon: Susa Simmonds, MD;  Location: [facility];  Service: Ophthalmology;  Laterality: Left;  CDE: 4.77   CATARACT EXTRACTION W/PHACO Right 02/03/2015   Procedure: CATARACT EXTRACTION PHACO AND INTRAOCULAR LENS PLACEMENT (IOC);  Surgeon: Susa Simmonds, MD;  Location: [facility];  Service: Ophthalmology;  Laterality: Right;  CDE: 4.54   COLONOSCOPY  06/19/2008   RMR: tortuous and elongated colon with scattered left-sided diverticula/colonic mucosa appeared entirely normal. Prior colonic ulcers had resolved.   COLONOSCOPY  12/2007  Dr. Jena Gauss: Scattered diffuse sigmoid diverticula, 2 areas of ulceration at the hepatic flexure. Biopsies unremarkable.   COLONOSCOPY N/A 11/20/2012   next TCS 11/2017   COLONOSCOPY WITH PROPOFOL N/A 12/22/2017   Procedure: COLONOSCOPY WITH PROPOFOL;  Surgeon: Corbin Ade, MD; diverticula in the sigmoid and descending colon and 2 tubular adenomas.  No recommendations to repeat.     DECOMPRESSIVE LUMBAR LAMINECTOMY LEVEL 2  04/20/2012   Procedure: DECOMPRESSIVE LUMBAR LAMINECTOMY LEVEL 2;  Surgeon: Jacki Cones, MD;  Location: [facility];  Service: Orthopedics;  Laterality: Right;  Decompressive Lumbar Laminectomy of the L4 - L5 and L5 - S1 Complete/Laminectomy L5 on the Right (X-Ray)   ESOPHAGOGASTRODUODENOSCOPY  12/2007   Dr. 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 Small hiatal hernia. Slightly pale duodenal mucosa (biopsy unremarkable)   ESOPHAGOGASTRODUODENOSCOPY (EGD) WITH PROPOFOL N/A 01/20/2017   Dr. Jena Gauss: Medium sized hiatal hernia empiric esophageal dilation for history of dysphagia   ESOPHAGOGASTRODUODENOSCOPY (EGD) WITH PROPOFOL N/A 04/08/2020   Surgeon:  Lanelle Bal, DO;  medium size hiatal hernia 3 to 4 cm chicken bone lodged in the gastric antrum just proximal to the pylorus removed with a snare with mild bleeding at the site of lodged bone.  Normal examined duodenum.   ESOPHAGOGASTRODUODENOSCOPY (EGD) WITH PROPOFOL N/A 02/10/2022   Procedure: ESOPHAGOGASTRODUODENOSCOPY (EGD) WITH PROPOFOL;  Surgeon: Corbin Ade, MD;  [facility]: [facility];  Service: Endoscopy;  Laterality: N/A;  8:30 AM   EYE SURGERY     BIL CATARACTS   FOREIGN BODY REMOVAL N/A 04/08/2020   Procedure: FOREIGN BODY REMOVAL;  Surgeon: Lanelle Bal, DO;  [facility]: [facility];  Service: Endoscopy;  Laterality: N/A;   INCISIONAL HERNIA REPAIR N/A 11/12/2020   Procedure: HERNIA REPAIR INCISIONAL;  Surgeon: Franky Macho, MD;  Location: [facility];  Service: General;  Laterality: N/A;   IR KYPHO THORACIC WITH BONE BIOPSY  02/08/2017   IR RADIOLOGIST EVAL & MGMT  02/02/2017   JOINT REPLACEMENT     KNEE ARTHROSCOPY Right    MALONEY DILATION N/A 01/20/2017   Procedure: MALONEY DILATION;  Surgeon: Corbin Ade, MD;  [facility]: [facility];  Service: Endoscopy;  Laterality: N/A;   MALONEY DILATION N/A 02/10/2022   Procedure: Elease Hashimoto DILATION;  Surgeon: Corbin Ade, MD;  [facility];  Service: Endoscopy;  Laterality: N/A;   POLYPECTOMY  12/22/2017   Procedure: POLYPECTOMY;  Surgeon: Corbin Ade, MD;  [facility];  Service: Endoscopy;;   REVERSE SHOULDER ARTHROPLASTY Right 05/24/2014   Procedure: RIGHT SHOULDER REVERSE ARTHROPLASTY;  Surgeon: Beverely Low, MD;  Location: [facility] OR;  Service: Orthopedics;  Laterality: Right;   REVERSE SHOULDER ARTHROPLASTY Left 06/10/2017   Procedure: LEFT REVERSE SHOULDER ARTHROPLASTY;  Surgeon: Beverely Low, MD;  Location: [facility] OR;  Service: Orthopedics;  Laterality: Left;   TOTAL KNEE ARTHROPLASTY  01/24/2012   Procedure: TOTAL KNEE ARTHROPLASTY;  Surgeon: Loanne Drilling, MD;  Location: [facility];  Service:  Orthopedics;  Laterality: Right;   TOTAL KNEE ARTHROPLASTY Left 10/15/2019   Procedure: TOTAL KNEE ARTHROPLASTY;  Surgeon: Ollen Gross, MD;  Location: [facility];  Service: Orthopedics;  Laterality: Left;    TUBAL LIGATION      Current Outpatient Medications  Medication Sig Dispense Refill   acetaminophen (TYLENOL) 500 MG tablet Take 1,000 mg by mouth every 6 (six) hours as needed for moderate pain.     albuterol (PROAIR HFA) 108 (90 Base) MCG/ACT  inhaler Inhale 2 puffs into the lungs every 6 (six) hours as needed for wheezing or shortness of breath (For COPD).      ALPRAZolam (XANAX) 0.5 MG tablet Take 0.5 tablets (0.25 mg total) by mouth 2 (two) times daily as needed for anxiety. 10 tablet 0   amLODipine (NORVASC) 5 MG tablet Take 1 tablet (5 mg total) by mouth daily. 90 tablet 1   Benralizumab 30 MG/ML SOSY Inject 30 mg into the skin See admin instructions. Inject 30 mg into the skin every 56 Days     calcitRIOL (ROCALTROL) 0.25 MCG capsule Take 0.25 mcg by mouth 2 (two) times a week.     calcium carbonate (OS-CAL) 600 MG TABS tablet Take 600 mg by mouth 2 (two) times daily with a meal.     carvedilol (COREG) 6.25 MG tablet TAKE (1) TABLET BY MOUTH TWICE DAILY. 60 tablet 3   cyanocobalamin 2000 MCG tablet Take 2,000 mcg by mouth daily.     denosumab (PROLIA) 60 MG/ML SOSY injection Inject 60 mg into the skin every 6 (six) months.     diphenhydrAMINE (BENADRYL) 25 mg capsule Take 25 mg by mouth at bedtime.     DULERA 200-5 MCG/ACT AERO Inhale 2 puffs into the lungs 2 (two) times daily. 8.8 g 0   ezetimibe (ZETIA) 10 MG tablet Take 10 mg by mouth daily.     ferrous sulfate 325 (65 FE) MG tablet Take 1 tablet (325 mg total) by mouth daily with breakfast. 30 tablet 0   fexofenadine (ALLEGRA) 180 MG tablet Take 180 mg by mouth daily.     fluticasone (FLONASE) 50 MCG/ACT nasal spray Place 1 spray into both nostrils daily as needed for allergies or rhinitis.      ipratropium-albuterol  (DUONEB) 0.5-2.5 (3) MG/3ML SOLN Take 3 mLs by nebulization every 4 (four) hours as needed (shortness of breath).     ketotifen (ZADITOR) 0.025 % ophthalmic solution Place 1 drop into both eyes 2 (two) times daily as needed (allergies). 5 mL 0   mirabegron ER (MYRBETRIQ) 25 MG TB24 tablet Take 1 tablet (25 mg total) by mouth daily. 30 tablet 11   montelukast (SINGULAIR) 10 MG tablet Take 1 tablet (10 mg total) by mouth at bedtime. 90 tablet 2   Oxycodone HCl 10 MG TABS Take 10 mg by mouth every 6 (six) hours as needed (pain).     pantoprazole (PROTONIX) 40 MG tablet Take 1 tablet (40 mg total) by mouth 2 (two) times daily. 60 tablet 11   Polyethyl Glycol-Propyl Glycol (SYSTANE OP) Place 1 drop into both eyes 3 (three) times daily as needed (dry/irritated eyes.).      polyethylene glycol (MIRALAX / GLYCOLAX) 17 g packet Take 17 g by mouth daily. 14 each 0   potassium chloride (KLOR-CON) 10 MEQ tablet Take 40 meq(4 tab) on Monday, Wednesday and Friday, all other days take 20 meq(2 tab) by mouth daily, Take additional 86meq(2tab) today, and 22meq(6tab) x3days then back to regular dosing 360 tablet 3   pravastatin (PRAVACHOL) 10 MG tablet Take 10 mg by mouth daily.     torsemide (DEMADEX) 20 MG tablet TAKE 2 TABLETS BY MOUTH ON MONDAY, WEDNESDAY, AND FRIDAY. ALLOTHER DAYS TAKE 1 TABLET DAILY. 128 tablet 1   vitamin C (ASCORBIC ACID) 500 MG tablet Take 500 mg by mouth daily.     No current facility-administered medications for this visit.    Allergies as of 10/14/2022 - Review Complete 10/14/2022  Allergen Reaction Noted  Flexeril [cyclobenzaprine] Anaphylaxis 01/12/2012   Other Anaphylaxis and Other (See Comments) 02/08/2011   Keflex [cephalexin] Itching 05/26/2012   Aspirin Other (See Comments) 05/05/2015   Fentanyl Swelling 11/09/2020   Hydrocodone  03/05/2021   Adhesive [tape] Rash 11/01/2014   Chlorhexidine Rash 11/19/2014    Family History  Problem Relation Age of Onset   Breast  cancer Sister    Colon cancer Sister        two deceased, one living and terminal, one current undergoing treatment, ages 5, 71, 60, 51   Hypertension Mother    Stroke Mother    Heart attack Mother    Hypertension Father    Prostate cancer Father     Social History   Socioeconomic History   Marital status: Single    Spouse name: Not on file   Number of children: 3   Years of education: Not on file   Highest education level: Not on file  Occupational History   Not on file  Tobacco Use   Smoking status: Former    Current packs/day: 0.00    Average packs/day: 2.0 packs/day for 40.0 years (80.0 ttl pk-yrs)    Types: Cigarettes    Start date: 01/19/1952    Quit date: 01/19/1992    Years since quitting: 30.7   Smokeless tobacco: Never  Vaping Use   Vaping status: Never Used  Substance and Sexual Activity   Alcohol use: No   Drug use: No   Sexual activity: Not Currently  Other Topics Concern   Not on file  Social History Narrative   Not on file   Social Determinants of Health   Financial Resource Strain: Not on file  Food Insecurity: Not on file  Transportation Needs: Not on file  Physical Activity: Not on file  Stress: Not on file  Social Connections: Not on file  Intimate Partner Violence: Not on file     Review of Systems   Gen: Denies any fever, chills, fatigue, weight loss, lack of appetite.  CV: Denies chest pain, heart palpitations, peripheral edema, syncope.  Resp: Denies shortness of breath at rest or with exertion. Denies wheezing or cough.  GI: Denies dysphagia or odynophagia. Denies jaundice, hematemesis, fecal incontinence. GU : Denies urinary burning, urinary frequency, urinary hesitancy MS: Denies joint pain, muscle weakness, cramps, or limitation of movement.  Derm: Denies rash, itching, dry skin Psych: Denies depression, anxiety, memory loss, and confusion Heme: Denies bruising, bleeding, and enlarged lymph nodes.   Physical Exam   BP 119/70  (BP Location: Right Arm, Patient Position: Sitting, Cuff Size: Large)   Pulse 77   Temp 98 F (36.7 C) (Oral)   Ht 5' (1.524 m)   Wt 174 lb 14.4 oz (79.3 kg)   SpO2 99%   BMI 34.16 kg/m  General:   Alert and oriented. Pleasant and cooperative. Well-nourished and well-developed.  Head:  Normocephalic and atraumatic. Eyes:  Without icterus Abdomen:  +BS, soft, non-tender and non-distended. No HSM noted. No guarding or rebound. No masses appreciated.  Rectal:  Deferred  Msk:  Symmetrical without gross deformities. Normal posture. Extremities:  Without edema. Neurologic:  Alert and  oriented x4;  grossly normal neurologically. Skin:  Intact without significant lesions or rashes. Psych:  Alert and cooperative. Normal mood and affect.   Assessment   Glenda Terry is an 82 y.o. female presenting today with a history of GERD and constipation for routine follow-up. She was last seen in April 2024 with reports of gas.  Constipation: impacted in interim from last visit after missing doses of Miralax. Recommend taking daily if needed; otherwise, she is doing well on every other day.  Gas: improved with FODMAP diet and gas-x.  GERD: on pantoprazole BID. Will decrease this to once daily if tolerated.       PLAN    Miralax every other day to daily as needed Attempt to decrease pantoprazole to once daily if tolerated Return 6 months   Gelene Mink, PhD, [facility] Gastroenterology

## 2022-11-02 ENCOUNTER — Ambulatory Visit (HOSPITAL_COMMUNITY)
Admission: RE | Admit: 2022-11-02 | Discharge: 2022-11-02 | Disposition: A | Discharge status: Home / Self Care | Payer: PPO | Source: Ambulatory Visit | Attending: Family Medicine | Admitting: Family Medicine

## 2022-11-02 ENCOUNTER — Ambulatory Visit (HOSPITAL_COMMUNITY): Payer: PPO

## 2022-11-02 DIAGNOSIS — M4184 Other forms of scoliosis, thoracic region: Secondary | ICD-10-CM | POA: Diagnosis not present

## 2022-11-02 DIAGNOSIS — M546 Pain in thoracic spine: Secondary | ICD-10-CM | POA: Diagnosis not present

## 2022-11-02 DIAGNOSIS — M5124 Other intervertebral disc displacement, thoracic region: Secondary | ICD-10-CM | POA: Diagnosis not present

## 2022-11-02 DIAGNOSIS — M4314 Spondylolisthesis, thoracic region: Secondary | ICD-10-CM | POA: Diagnosis not present

## 2022-11-05 ENCOUNTER — Other Ambulatory Visit: Payer: Self-pay

## 2022-11-05 DIAGNOSIS — E538 Deficiency of other specified B group vitamins: Secondary | ICD-10-CM | POA: Diagnosis not present

## 2022-11-05 DIAGNOSIS — D509 Iron deficiency anemia, unspecified: Secondary | ICD-10-CM | POA: Diagnosis not present

## 2022-11-05 DIAGNOSIS — D7219 Other eosinophilia: Secondary | ICD-10-CM | POA: Diagnosis not present

## 2022-11-05 DIAGNOSIS — J449 Chronic obstructive pulmonary disease, unspecified: Secondary | ICD-10-CM | POA: Diagnosis not present

## 2022-11-05 DIAGNOSIS — R252 Cramp and spasm: Secondary | ICD-10-CM | POA: Diagnosis not present

## 2022-11-05 DIAGNOSIS — J4550 Severe persistent asthma, uncomplicated: Secondary | ICD-10-CM | POA: Diagnosis not present

## 2022-11-05 DIAGNOSIS — R718 Other abnormality of red blood cells: Secondary | ICD-10-CM | POA: Diagnosis not present

## 2022-11-05 DIAGNOSIS — E1122 Type 2 diabetes mellitus with diabetic chronic kidney disease: Secondary | ICD-10-CM | POA: Diagnosis not present

## 2022-11-05 DIAGNOSIS — J301 Allergic rhinitis due to pollen: Secondary | ICD-10-CM | POA: Diagnosis not present

## 2022-11-10 DIAGNOSIS — M797 Fibromyalgia: Secondary | ICD-10-CM | POA: Diagnosis not present

## 2022-11-10 DIAGNOSIS — M1990 Unspecified osteoarthritis, unspecified site: Secondary | ICD-10-CM | POA: Diagnosis not present

## 2022-11-10 DIAGNOSIS — I130 Hypertensive heart and chronic kidney disease with heart failure and stage 1 through stage 4 chronic kidney disease, or unspecified chronic kidney disease: Secondary | ICD-10-CM | POA: Diagnosis not present

## 2022-11-10 DIAGNOSIS — R600 Localized edema: Secondary | ICD-10-CM | POA: Diagnosis not present

## 2022-11-10 DIAGNOSIS — Z87311 Personal history of (healed) other pathological fracture: Secondary | ICD-10-CM | POA: Diagnosis not present

## 2022-11-10 DIAGNOSIS — G629 Polyneuropathy, unspecified: Secondary | ICD-10-CM | POA: Diagnosis not present

## 2022-11-10 DIAGNOSIS — N1832 Chronic kidney disease, stage 3b: Secondary | ICD-10-CM | POA: Diagnosis not present

## 2022-11-10 DIAGNOSIS — I5032 Chronic diastolic (congestive) heart failure: Secondary | ICD-10-CM | POA: Diagnosis not present

## 2022-11-10 DIAGNOSIS — E1122 Type 2 diabetes mellitus with diabetic chronic kidney disease: Secondary | ICD-10-CM | POA: Diagnosis not present

## 2022-11-10 DIAGNOSIS — G5603 Carpal tunnel syndrome, bilateral upper limbs: Secondary | ICD-10-CM | POA: Diagnosis not present

## 2022-11-10 DIAGNOSIS — K068 Other specified disorders of gingiva and edentulous alveolar ridge: Secondary | ICD-10-CM | POA: Diagnosis not present

## 2022-11-24 ENCOUNTER — Other Ambulatory Visit: Payer: Self-pay

## 2022-12-13 ENCOUNTER — Other Ambulatory Visit: Payer: Self-pay | Admitting: Internal Medicine

## 2022-12-23 ENCOUNTER — Telehealth: Payer: Self-pay | Admitting: Internal Medicine

## 2022-12-23 MED ORDER — CARVEDILOL 6.25 MG PO TABS: 6.2500 mg | ORAL_TABLET | Freq: Two times a day (BID) | ORAL | 1 refills | Status: DC

## 2022-12-23 NOTE — Telephone Encounter (Signed)
Pt's medication was sent to pt's pharmacy as requested. Confirmation received.  °

## 2022-12-23 NOTE — Telephone Encounter (Signed)
*  STAT* If patient is at the pharmacy, call can be transferred to refill team.   1. Which medications need to be refilled? (please list name of each medication and dose if known)   carvedilol (COREG) 6.25 MG tablet   2. Would you like to learn more about the convenience, safety, & potential cost savings by using the [facility]?   3. Are you open to using the [facility] (Type [facility]. ).  4. Which pharmacy/location (including street and city if local pharmacy) is medication to be sent to?  [facility] - [place], [place] - [address]   5. Do they need a 30 day or 90 day supply?  90 day  Patient stated she only has 2 tablets left.  Patient has appointment on 11/5.

## 2022-12-24 DIAGNOSIS — D7219 Other eosinophilia: Secondary | ICD-10-CM | POA: Diagnosis not present

## 2022-12-24 DIAGNOSIS — Z23 Encounter for immunization: Secondary | ICD-10-CM | POA: Diagnosis not present

## 2023-01-12 ENCOUNTER — Encounter: Payer: Self-pay | Admitting: Ophthalmology

## 2023-01-18 ENCOUNTER — Ambulatory Visit: Payer: PPO | Admitting: Internal Medicine

## 2023-01-18 NOTE — Discharge Instructions (Signed)
INSTRUCTIONS FOLLOWING OCULOPLASTIC SURGERY AMY M. FOWLER, MD  AFTER YOUR EYE SURGERY, THER ARE MANY THINGS WHICH YOU, THE PATIENT, CAN DO TO ASSURE THE BEST POSSIBLE RESULT FROM YOUR OPERATION.  THIS SHEET SHOULD BE REFERRED TO WHENEVER QUESTIONS ARISE.  IF THERE ARE ANY QUESTIONS NOT ANSWERED HERE, DO NOT HESITATE TO CALL OUR OFFICE AT [phone] OR [phone].  THERE IS ALWAYS SOMEONE AVAILABLE TO CALL IF QUESTIONS OR PROBLEMS ARISE.  VISION: Your vision may be blurred and out of focus after surgery until you are able to stop using your ointment, swelling resolves and your eye(s) heal. This may take 1 to 2 weeks at the least.  If your vision becomes gradually more dim or dark, this is not normal and you need to call our office immediately.  EYE CARE: For the first 48 hours after surgery, use ice packs frequently - "20 minutes on, 20 minutes off" - to help reduce swelling and bruising.  Small bags of frozen peas or corn make good ice packs along with cloths soaked in ice water.  If you are wearing a patch or other type of dressing following surgery, keep this on for the amount of time specified by your doctor.  For the first week following surgery, you will need to treat your stitches with great care.  It is OK to shower, but take care to not allow soapy water to run into your eye(s) to help reduce chances of infection.  You may gently clean the eyelashes and around the eye(s) with cotton balls and bottled water, BUT DO NOT RUB THE STITCHES VIGOROUSLY.  Keeping your stitches moist with ointment will help promote healing with minimal scar formation.  ACTIVITY: When you leave the surgery center, you should go home, rest and be inactive.  The eye(s) may feel scratchy and keeping the eyes closed will allow for faster healing.  The first week following surgery, avoid straining (anything making the face turn red) or lifting over 20 pounds.  Additionally, avoid bending which causes your head to go below  your waist.  Using your eyes will NOT harm them, so feel free to read, watch television, use the computer, etc as desired.  Driving depends on each individual, so check with your doctor if you have questions about driving. Do not wear contact lenses for about 2 weeks.  Do not wear eye makeup for 2 weeks.  Avoid swimming, hot tubs, gardening, and dusting for 1 to 2 weeks to reduce the risk of an infection.  MEDICATIONS:  You will be given a prescription for an ointment to use 4 times a day on your stitches.  You can use the ointment in your eyes if they feel scratchy or irritated.  If you eyelid(s) don't close completely when you sleep, put some ointment in your eyes before bedtime.  EMERGENCY: If you experience SEVERE EYE PAIN OR HEADACHE UNRELIEVED BY TYLENOL OR TRAMADOL, NAUSEA OR VOMITING, WORSENING REDNESS, OR WORSENING VISION (ESPECIALLY VISION THAT WAS INITIALLY BETTER) CALL [phone] OR [phone] DURING BUSINESS HOURS OR AFTER HOURS. 

## 2023-01-20 ENCOUNTER — Encounter: Payer: Self-pay | Admitting: Ophthalmology

## 2023-01-20 ENCOUNTER — Ambulatory Visit: Payer: PPO | Admitting: Anesthesiology

## 2023-01-20 ENCOUNTER — Encounter
Admission: RE | Disposition: A | Discharge status: Home / Self Care | Payer: Self-pay | Source: Home / Self Care | Attending: Ophthalmology

## 2023-01-20 ENCOUNTER — Ambulatory Visit
Admission: RE | Admit: 2023-01-20 | Discharge: 2023-01-20 | Disposition: A | Discharge status: Home / Self Care | Payer: PPO | Attending: Ophthalmology | Admitting: Ophthalmology

## 2023-01-20 ENCOUNTER — Other Ambulatory Visit: Payer: Self-pay

## 2023-01-20 DIAGNOSIS — E249 Cushing's syndrome, unspecified: Secondary | ICD-10-CM | POA: Diagnosis not present

## 2023-01-20 DIAGNOSIS — G8929 Other chronic pain: Secondary | ICD-10-CM | POA: Diagnosis not present

## 2023-01-20 DIAGNOSIS — H02403 Unspecified ptosis of bilateral eyelids: Secondary | ICD-10-CM | POA: Diagnosis not present

## 2023-01-20 DIAGNOSIS — M797 Fibromyalgia: Secondary | ICD-10-CM | POA: Diagnosis not present

## 2023-01-20 DIAGNOSIS — H02833 Dermatochalasis of right eye, unspecified eyelid: Secondary | ICD-10-CM | POA: Insufficient documentation

## 2023-01-20 DIAGNOSIS — J8289 Other pulmonary eosinophilia, not elsewhere classified: Secondary | ICD-10-CM | POA: Diagnosis not present

## 2023-01-20 DIAGNOSIS — K219 Gastro-esophageal reflux disease without esophagitis: Secondary | ICD-10-CM | POA: Diagnosis not present

## 2023-01-20 DIAGNOSIS — J4489 Other specified chronic obstructive pulmonary disease: Secondary | ICD-10-CM | POA: Diagnosis not present

## 2023-01-20 DIAGNOSIS — Z87891 Personal history of nicotine dependence: Secondary | ICD-10-CM | POA: Insufficient documentation

## 2023-01-20 DIAGNOSIS — D509 Iron deficiency anemia, unspecified: Secondary | ICD-10-CM | POA: Insufficient documentation

## 2023-01-20 DIAGNOSIS — I130 Hypertensive heart and chronic kidney disease with heart failure and stage 1 through stage 4 chronic kidney disease, or unspecified chronic kidney disease: Secondary | ICD-10-CM | POA: Insufficient documentation

## 2023-01-20 DIAGNOSIS — H02834 Dermatochalasis of left upper eyelid: Secondary | ICD-10-CM | POA: Diagnosis not present

## 2023-01-20 DIAGNOSIS — I509 Heart failure, unspecified: Secondary | ICD-10-CM | POA: Diagnosis not present

## 2023-01-20 DIAGNOSIS — I5042 Chronic combined systolic (congestive) and diastolic (congestive) heart failure: Secondary | ICD-10-CM | POA: Diagnosis not present

## 2023-01-20 DIAGNOSIS — N183 Chronic kidney disease, stage 3 unspecified: Secondary | ICD-10-CM | POA: Insufficient documentation

## 2023-01-20 DIAGNOSIS — H02831 Dermatochalasis of right upper eyelid: Secondary | ICD-10-CM | POA: Diagnosis not present

## 2023-01-20 HISTORY — DX: Presence of dental prosthetic device (complete) (partial): Z97.2

## 2023-01-20 HISTORY — DX: Presence of external hearing-aid: Z97.4

## 2023-01-20 HISTORY — PX: BROW LIFT: SHX178

## 2023-01-20 HISTORY — DX: Dorsopathy, unspecified: M53.9

## 2023-01-20 SURGERY — BLEPHAROPLASTY
Anesthesia: Monitor Anesthesia Care | Laterality: Bilateral

## 2023-01-20 MED ORDER — PHENYLEPHRINE HCL (PRESSORS) 10 MG/ML IV SOLN
INTRAVENOUS | Status: DC | PRN
  Administered 2023-01-20 (×2): 120 ug via INTRAVENOUS

## 2023-01-20 MED ORDER — TETRACAINE HCL 0.5 % OP SOLN
OPHTHALMIC | Status: DC | PRN
  Administered 2023-01-20: 2 [drp] via OPHTHALMIC

## 2023-01-20 MED ORDER — ERYTHROMYCIN 5 MG/GM OP OINT: TOPICAL_OINTMENT | OPHTHALMIC | 2 refills | Status: DC

## 2023-01-20 MED ORDER — LIDOCAINE-EPINEPHRINE 2 %-1:100000 IJ SOLN
INTRAMUSCULAR | Status: DC | PRN
  Administered 2023-01-20: 2.5 mL via OPHTHALMIC

## 2023-01-20 MED ORDER — PROPOFOL 10 MG/ML IV BOLUS
INTRAVENOUS | Status: DC | PRN
Start: 2023-01-20 — End: 2023-01-20
  Administered 2023-01-20: 40 mg via INTRAVENOUS
  Administered 2023-01-20: 20 mg via INTRAVENOUS
  Administered 2023-01-20: 15 mg via INTRAVENOUS
  Administered 2023-01-20: 20 mg via INTRAVENOUS
  Administered 2023-01-20: 10 mg via INTRAVENOUS

## 2023-01-20 MED ORDER — SODIUM CHLORIDE 0.9% FLUSH
10.0000 mL | INTRAVENOUS | Status: DC | PRN
  Administered 2023-01-20: 10 mL via INTRAVENOUS
  Administered 2023-01-20: 20 mL via INTRAVENOUS

## 2023-01-20 MED ORDER — PROPOFOL 500 MG/50ML IV EMUL: INTRAVENOUS | Status: DC | PRN

## 2023-01-20 MED ORDER — GLYCOPYRROLATE 0.2 MG/ML IJ SOLN
INTRAMUSCULAR | Status: AC
  Filled 2023-01-20: qty 1

## 2023-01-20 MED ORDER — PROPOFOL 10 MG/ML IV BOLUS
INTRAVENOUS | Status: AC
  Filled 2023-01-20: qty 20

## 2023-01-20 MED ORDER — PROPOFOL 10 MG/ML IV BOLUS
INTRAVENOUS | Status: AC
  Filled 2023-01-20: qty 40

## 2023-01-20 MED ORDER — PROPOFOL 500 MG/50ML IV EMUL
INTRAVENOUS | Status: DC | PRN
  Administered 2023-01-20: 80 ug/kg/min via INTRAVENOUS

## 2023-01-20 MED ORDER — LACTATED RINGERS IV SOLN: INTRAVENOUS | Status: DC

## 2023-01-20 MED ORDER — LIDOCAINE HCL (CARDIAC) PF 100 MG/5ML IV SOSY
PREFILLED_SYRINGE | INTRAVENOUS | Status: DC | PRN
Start: 2023-01-20 — End: 2023-01-20
  Administered 2023-01-20: 20 mg via INTRAVENOUS

## 2023-01-20 MED ORDER — TRAMADOL HCL 50 MG PO TABS: ORAL_TABLET | ORAL | 0 refills | Status: DC

## 2023-01-20 MED ORDER — BSS IO SOLN
INTRAOCULAR | Status: DC | PRN
  Administered 2023-01-20: 15 mL via INTRAOCULAR

## 2023-01-20 SURGICAL SUPPLY — 22 items
APPLICATOR COTTON TIP WD 3 STR (MISCELLANEOUS) ×2 IMPLANT
BLADE SURG 15 STRL LF DISP TIS (BLADE) ×2 IMPLANT
BLADE SURG 15 STRL SS (BLADE) ×1
CORD BIP STRL DISP 12FT (MISCELLANEOUS) ×2 IMPLANT
GAUZE SPONGE 2X2 STRL 8-PLY (GAUZE/BANDAGES/DRESSINGS) ×20 IMPLANT
GAUZE SPONGE 4X4 12PLY STRL (GAUZE/BANDAGES/DRESSINGS) ×2 IMPLANT
GLOVE SURG UNDER POLY LF SZ7 (GLOVE) ×4 IMPLANT
GOWN STRL REUS W/ TWL LRG LVL3 (GOWN DISPOSABLE) ×2 IMPLANT
GOWN STRL REUS W/TWL LRG LVL3 (GOWN DISPOSABLE) ×1
MARKER SKIN XFINE TIP W/RULER (MISCELLANEOUS) ×2 IMPLANT
NDL FILTER BLUNT 18X1 1/2 (NEEDLE) ×2 IMPLANT
NDL HYPO 30X.5 LL (NEEDLE) ×4 IMPLANT
NEEDLE FILTER BLUNT 18X1 1/2 (NEEDLE) ×1
NEEDLE HYPO 30X.5 LL (NEEDLE) ×2
PACK ENT CUSTOM (PACKS) ×2 IMPLANT
SOL PREP PVP 2OZ (MISCELLANEOUS) ×1
SOLUTION PREP PVP 2OZ (MISCELLANEOUS) ×2 IMPLANT
SUT GUT PLAIN 6-0 1X18 ABS (SUTURE) ×2 IMPLANT
SUT PROLENE 6 0 P 1 18 (SUTURE) IMPLANT
SYR 10ML LL (SYRINGE) ×2 IMPLANT
SYR 3ML LL SCALE MARK (SYRINGE) ×2 IMPLANT
WATER STERILE IRR 250ML POUR (IV SOLUTION) ×2 IMPLANT

## 2023-01-20 NOTE — Interval H&P Note (Signed)
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  The various methods of treatment have been discussed with the patient and family. After consideration of risks, benefits and other options for treatment, the patient has consented to  Procedure(s): BLEPHAROPLASTY UPPER EYELID; W/EXCESS SKIN BLEPHAROPTOSIS REPAIR; RESECT EX BILATERAL (Bilateral) as a surgical intervention.  The patient's history has been reviewed, patient examined, no change in status, stable for surgery.  I have reviewed the patient's chart and labs.  Questions were answered to the patient's satisfaction.     Ether Griffins, Khary Schaben M

## 2023-01-20 NOTE — H&P (Signed)
Eye Center: [facility]  Primary Care Physician:  Benita Stabile, MD Ophthalmologist: Dr. Hubbard Robinson. Ether Griffins, M.D.  Pre-Procedure History & Physical: HPI:  Glenda Terry is a 82 y.o. female here for periocular surgery.   Past Medical History:  Diagnosis Date   Anxiety    Asthma    Back pain, chronic    CKD (chronic kidney disease) stage 3, GFR 30-59 ml/min (HCC)    Compression fx, thoracic spine (HCC)    T - 11   COPD (chronic obstructive pulmonary disease) (HCC) 12/16/2015   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HCC)    Diverticulitis    Essential hypertension    GERD (gastroesophageal reflux disease)    HOH (hard of hearing)    Inflammatory arthritis    Iron deficiency anemia 01/2012   Multilevel degenerative disc disease    Nausea with vomiting 01/14/2020   Pulmonary eosinophilia (HCC)    Spinal stenosis    bilateral, lumbar   Tubular adenoma 11/2012   Wears dentures    full upper and lower   Wears hearing aid in both ears     Past Surgical History:  Procedure Laterality Date   ABDOMINAL HYSTERECTOMY     BACK SURGERY     CARPAL TUNNEL RELEASE Right 06/2012   CATARACT EXTRACTION W/PHACO Left 11/19/2014   Procedure: CATARACT EXTRACTION PHACO AND INTRAOCULAR LENS PLACEMENT (IOC);  Surgeon: Susa Simmonds, MD;  Location: [facility];  Service: Ophthalmology;  Laterality: Left;  CDE: 4.77   CATARACT EXTRACTION W/PHACO Right 02/03/2015   Procedure: CATARACT EXTRACTION PHACO AND INTRAOCULAR LENS PLACEMENT (IOC);  Surgeon: Susa Simmonds, MD;  Location: [facility];  Service: Ophthalmology;  Laterality: Right;  CDE: 4.54   COLONOSCOPY  06/19/2008   RMR: tortuous and elongated colon with scattered left-sided diverticula/colonic mucosa appeared entirely normal. Prior colonic ulcers had resolved.   COLONOSCOPY  12/2007   Dr. Jena Gauss: Scattered diffuse sigmoid diverticula, 2 areas of ulceration at the hepatic flexure. Biopsies unremarkable.   COLONOSCOPY N/A 11/20/2012   next TCS 11/2017    COLONOSCOPY WITH PROPOFOL N/A 12/22/2017   Procedure: COLONOSCOPY WITH PROPOFOL;  Surgeon: Corbin Ade, MD; diverticula in the sigmoid and descending colon and 2 tubular adenomas.  No recommendations to repeat.     DECOMPRESSIVE LUMBAR LAMINECTOMY LEVEL 2  04/20/2012   Procedure: DECOMPRESSIVE LUMBAR LAMINECTOMY LEVEL 2;  Surgeon: Jacki Cones, MD;  Location: [facility];  Service: Orthopedics;  Laterality: Right;  Decompressive Lumbar Laminectomy of the L4 - L5 and L5 - S1 Complete/Laminectomy L5 on the Right (X-Ray)   ESOPHAGOGASTRODUODENOSCOPY  12/2007   Dr. 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 Small hiatal hernia. Slightly pale duodenal mucosa (biopsy unremarkable)   ESOPHAGOGASTRODUODENOSCOPY (EGD) WITH PROPOFOL N/A 01/20/2017   Dr. Jena Gauss: Medium sized hiatal hernia empiric esophageal dilation for history of dysphagia   ESOPHAGOGASTRODUODENOSCOPY (EGD) WITH PROPOFOL N/A 04/08/2020   Surgeon: Lanelle Bal, DO;  medium size hiatal hernia 3 to 4 cm chicken bone lodged in the gastric antrum just proximal to the pylorus removed with a snare with mild bleeding at the site of lodged bone.  Normal examined duodenum.   ESOPHAGOGASTRODUODENOSCOPY (EGD) WITH PROPOFOL N/A 02/10/2022   Procedure: ESOPHAGOGASTRODUODENOSCOPY (EGD) WITH PROPOFOL;  Surgeon: Corbin Ade, MD;  Location: [facility];  Service: Endoscopy;  Laterality: N/A;  8:30 AM   EYE SURGERY     BIL CATARACTS   FOREIGN BODY REMOVAL N/A 04/08/2020   Procedure: FOREIGN  BODY REMOVAL;  Surgeon: Lanelle Bal, DO;  Location: [facility];  Service: Endoscopy;  Laterality: N/A;   INCISIONAL HERNIA REPAIR N/A 11/12/2020   Procedure: HERNIA REPAIR INCISIONAL;  Surgeon: Franky Macho, MD;  Location: [facility];  Service: General;  Laterality: N/A;   IR KYPHO THORACIC WITH BONE BIOPSY  02/08/2017   IR RADIOLOGIST EVAL & MGMT  02/02/2017   JOINT REPLACEMENT     KNEE ARTHROSCOPY Right     MALONEY DILATION N/A 01/20/2017   Procedure: MALONEY DILATION;  Surgeon: Corbin Ade, MD;  Location: [facility];  Service: Endoscopy;  Laterality: N/A;   MALONEY DILATION N/A 02/10/2022   Procedure: Elease Hashimoto DILATION;  Surgeon: Corbin Ade, MD;  Location: [facility];  Service: Endoscopy;  Laterality: N/A;   POLYPECTOMY  12/22/2017   Procedure: POLYPECTOMY;  Surgeon: Corbin Ade, MD;  Location: [facility];  Service: Endoscopy;;   REVERSE SHOULDER ARTHROPLASTY Right 05/24/2014   Procedure: RIGHT SHOULDER REVERSE ARTHROPLASTY;  Surgeon: Beverely Low, MD;  Location: [facility] OR;  Service: Orthopedics;  Laterality: Right;   REVERSE SHOULDER ARTHROPLASTY Left 06/10/2017   Procedure: LEFT REVERSE SHOULDER ARTHROPLASTY;  Surgeon: Beverely Low, MD;  Location: [facility] OR;  Service: Orthopedics;  Laterality: Left;   TOTAL KNEE ARTHROPLASTY  01/24/2012   Procedure: TOTAL KNEE ARTHROPLASTY;  Surgeon: Loanne Drilling, MD;  Location: [facility];  Service: Orthopedics;  Laterality: Right;   TOTAL KNEE ARTHROPLASTY Left 10/15/2019   Procedure: TOTAL KNEE ARTHROPLASTY;  Surgeon: Ollen Gross, MD;  Location: [facility];  Service: Orthopedics;  Laterality: Left;    TUBAL LIGATION      Prior to Admission medications   Medication Sig Start Date End Date Taking? Authorizing Provider  acetaminophen (TYLENOL) 500 MG tablet Take 1,000 mg by mouth every 6 (six) hours as needed for moderate pain.   Yes [provider]  albuterol (PROAIR HFA) 108 (90 Base) MCG/ACT inhaler Inhale 2 puffs into the lungs every 6 (six) hours as needed for wheezing or shortness of breath (For COPD).    Yes [provider]  ALPRAZolam Prudy Feeler) 0.5 MG tablet Take 0.5 tablets (0.25 mg total) by mouth 2 (two) times daily as needed for anxiety. 10/25/19  Yes Sharee Holster, NP  amLODipine (NORVASC) 5 MG tablet Take 1 tablet (5 mg total) by mouth daily. 09/15/22  Yes Parke Poisson, MD  Benralizumab 30 MG/ML SOSY  Inject 30 mg into the skin See admin instructions. Inject 30 mg into the skin every 56 Days 10/17/19  Yes [provider]  calcitRIOL (ROCALTROL) 0.25 MCG capsule Take 0.25 mcg by mouth 2 (two) times a week. 09/29/22  Yes [provider]  calcium carbonate (OS-CAL) 600 MG TABS tablet Take 600 mg by mouth 2 (two) times daily with a meal.   Yes [provider]  carvedilol (COREG) 6.25 MG tablet Take 1 tablet (6.25 mg total) by mouth 2 (two) times daily with a meal. 12/23/22  Yes Parke Poisson, MD  Cholecalciferol (VITAMIN D3) 25 MCG (1000 UT) CAPS Take by mouth 2 (two) times a week.   Yes [provider]  cyanocobalamin 2000 MCG tablet Take 2,000 mcg by mouth daily. 09/25/21  Yes [provider]  denosumab (PROLIA) 60 MG/ML SOSY injection Inject 60 mg into the skin every 6 (six) months. 03/01/18  Yes [provider]  diphenhydrAMINE (BENADRYL) 25 mg capsule Take 25 mg by mouth at bedtime.   Yes [provider]  ezetimibe (ZETIA) 10 MG tablet Take 10 mg by mouth daily. 08/02/22  Yes [provider]  ferrous sulfate 325 (65 FE) MG tablet Take 1 tablet (325 mg total) by mouth daily with breakfast. 10/25/19  Yes Sharee Holster, NP  fexofenadine (ALLEGRA) 180 MG tablet Take 180 mg by mouth daily.   Yes [provider]  fluticasone (FLONASE) 50 MCG/ACT nasal spray Place 1 spray into both nostrils daily as needed for allergies or rhinitis.  10/17/19  Yes [provider]  ipratropium-albuterol (DUONEB) 0.5-2.5 (3) MG/3ML SOLN Take 3 mLs by nebulization every 4 (four) hours as needed (shortness of breath).   Yes [provider]  ketotifen (ZADITOR) 0.025 % ophthalmic solution Place 1 drop into both eyes 2 (two) times daily as needed (allergies). 10/25/19  Yes Sharee Holster, NP  montelukast (SINGULAIR) 10 MG tablet Take 1 tablet (10 mg total) by mouth at bedtime. 07/25/20  Yes Parke Poisson, MD  Oxycodone HCl  10 MG TABS Take 10 mg by mouth every 6 (six) hours as needed (pain). 12/29/19  Yes [provider]  pantoprazole (PROTONIX) 40 MG tablet Take 1 tablet (40 mg total) by mouth 2 (two) times daily. Patient taking differently: Take 40 mg by mouth daily. 02/02/22  Yes Rourk, Gerrit Friends, MD  Polyethyl Glycol-Propyl Glycol (SYSTANE OP) Place 1 drop into both eyes 3 (three) times daily as needed (dry/irritated eyes.).    Yes [provider]  polyethylene glycol (MIRALAX / GLYCOLAX) 17 g packet Take 17 g by mouth daily. 11/07/20  Yes Tegeler, Canary Brim, MD  potassium chloride (KLOR-CON) 10 MEQ tablet Take 40 meq(4 tab) on Monday, Wednesday and Friday, all other days take 20 meq(2 tab) by mouth daily, Take additional 20meq(2tab) today, and 94meq(6tab) x3days then back to regular dosing 06/04/22  Yes Parke Poisson, MD  pravastatin (PRAVACHOL) 10 MG tablet Take 10 mg by mouth daily. 03/27/20  Yes [provider]  torsemide (DEMADEX) 20 MG tablet TAKE 2 TABLETS BY MOUTH ON MONDAY, WEDNESDAY, AND FRIDAY. ALLOTHER DAYS TAKE 1 TABLET DAILY. 05/07/22  Yes Cleaver, Thomasene Ripple, NP  vitamin C (ASCORBIC ACID) 500 MG tablet Take 500 mg by mouth daily.   Yes [provider]  DULERA 200-5 MCG/ACT AERO Inhale 2 puffs into the lungs 2 (two) times daily. 10/25/19   Sharee Holster, NP  mirabegron ER (MYRBETRIQ) 25 MG TB24 tablet Take 1 tablet (25 mg total) by mouth daily. Patient not taking: Reported on 01/12/2023 09/09/22   Donnita Falls, FNP    Allergies as of 08/23/2022 - Review Complete 07/12/2022  Allergen Reaction Noted   Flexeril [cyclobenzaprine] Anaphylaxis 01/12/2012   Other Anaphylaxis and Other (See Comments) 02/08/2011   Keflex [cephalexin] Itching 05/26/2012   Aspirin Other (See Comments) 05/05/2015   Fentanyl Swelling 11/09/2020   Hydrocodone  03/05/2021   Adhesive [tape] Rash 11/01/2014   Chlorhexidine Rash 11/19/2014    Family History  Problem Relation Age of  Onset   Breast cancer Sister    Colon cancer Sister        two deceased, one living and terminal, one current undergoing treatment, ages 42, 66, 62, 1   Hypertension Mother    Stroke Mother    Heart attack Mother    Hypertension Father    Prostate cancer Father     Social History   Socioeconomic History   Marital status: Single    Spouse name: Not on file   Number of children:  3   Years of education: Not on file   Highest education level: Not on file  Occupational History   Not on file  Tobacco Use   Smoking status: Former    Current packs/day: 0.00    Average packs/day: 2.0 packs/day for 40.0 years (80.0 ttl pk-yrs)    Types: Cigarettes    Start date: 01/19/1952    Quit date: 01/19/1992    Years since quitting: 31.0   Smokeless tobacco: Never  Vaping Use   Vaping status: Never Used  Substance and Sexual Activity   Alcohol use: No   Drug use: No   Sexual activity: Not Currently  Other Topics Concern   Not on file  Social History Narrative   Not on file   Social Determinants of Health   Financial Resource Strain: Not on file  Food Insecurity: Not on file  Transportation Needs: Not on file  Physical Activity: Not on file  Stress: Not on file  Social Connections: Not on file  Intimate Partner Violence: Not on file    Review of Systems: See HPI, otherwise negative ROS  Physical Exam: BP 133/89   Temp 98.4 F (36.9 C) (Temporal)   Resp 14   Ht 5' (1.524 m)   Wt 81.6 kg   SpO2 98%   BMI 35.13 kg/m  General:   Alert and cooperative in NAD Head:  Normocephalic and atraumatic. Respiratory:  Normal work of breathing.  Impression/Plan: Glenda Terry is here for periocular surgery.  Risks, benefits, limitations, and alternatives regarding surgery have been reviewed with the patient.  Questions have been answered.  All parties agreeable.   Imagene Riches, MD  01/20/2023, 12:55 PM

## 2023-01-20 NOTE — Op Note (Signed)
Preoperative Diagnosis:  1. Visually significant blepharoptosis bilateral  Upper Eyelid(s) 2. Visually significant dermatochalasis bilateral  Upper Eyelid(s)  Postoperative Diagnosis:  Same.  Procedure(s) Performed:   1. Blepharoptosis repair with levator aponeurosis advancement both  Upper Eyelid(s) 2. Upper eyelid blepharoplasty with excess skin excision  both  Upper Eyelid(s)  Surgeon: Hubbard Robinson. Ether Griffins, M.D.  Assistants: none  Anesthesia: MAC  Specimens: None.  Estimated Blood Loss: Minimal.  Complications: None.  Operative Findings: None Dictated  Procedure:   Allergies were reviewed and the patient Flexeril [cyclobenzaprine], Other, Keflex [cephalexin], Aspirin, Fentanyl, Hydrocodone, Adhesive [tape], and Chlorhexidine.   After the risks, benefits, complications and alternatives were discussed with the patient, appropriate informed consent was obtained.  While seated in an upright position and looking in primary gaze, the mid pupillary line was marked on the upper eyelid margins bilaterally. The patient was then brought to the operating suite and reclined supine.  Timeout was conducted and the patient was sedated.  Local anesthetic consisting of a 50-50 mixture of 2% lidocaine with epinephrine and 0.75% bupivacaine with added Hylenex was injected subcutaneously to both  upper eyelid(s). After adequate local was instilled, the patient was prepped and draped in the usual sterile fashion for eyelid surgery.   Attention was turned to the upper eyelids. A 9mm upper eyelid crease incision line was marked with calipers on both  upper eyelid(s).  A pinch test was used to estimate the amount of excess skin to remove and this was marked in standard blepharoplasty style fashion. Attention was turned to the  right  upper eyelid. A #15 blade was used to open the premarked incision line. A Skin and muscle flap was excised and hemostasis was obtained with bipolar cautery. A buttonhole was  created medially in orbicularis and orbital septum to reveal the medial fat pocket. This was dissected free from fascial attachments, cauterized towards the pedicle base and excised to produce a nice flattening of the medial corner of the upper eyelid.  Westcott scissors were then used to transect through orbicularis down to the tarsal plate. Epitarsus was dissected to create a smooth surface to suture to. Dissection was then carried superiorly in the plane between orbicularis and orbital septum. Once the preaponeurotic fat pocket was identified, the orbital septum was opened. This revealed the levator and its aponeurosis.    Attention was then turned to the opposite eyelid where the same procedure was performed in the same manner. Hemostasis was obtained with bipolar cautery throughout.   3 interrupted 6-0 Prolene sutures were then passed partial thickness through the tarsal plates of both  upper eyelid(s). These sutures were placed in line with the mid pupillary, medial limbal, and lateral limbal lines. The sutures were fixed to the levator aponeurosis and adjusted until a nice lid height and contour were achieved. Once nice symmetry was achieved, the skin incisions were closed with a running 6-0 plain gut suture. The patient tolerated the procedure well.  Erythromycin ophthalmic ophthalmic ointment was applied to the incision site(s) followed by ice packs. The patient was taken to the recovery area where she recovered without difficulty.  Post-Op Plan/Instructions:   The patient was instructed to use ice packs frequently for the next 48 hours. She was instructed to use Erythromycin ophthalmic ophthalmic ointment on her incisions 4 times a day for the next 12 to 14 days. Shewas given a prescription for tramadol (or similar) for pain control should Tylenol not be effective. She was asked to to follow up at  the [facility] in [name], [place] in 2-3 weeks' time or sooner as needed for problems.  Glenda Terry M.  Ether Griffins, M.D. Ophthalmology

## 2023-01-20 NOTE — Transfer of Care (Signed)
Immediate Anesthesia Transfer of Care Note  Patient: Glenda Terry  Procedure(s) Performed: BLEPHAROPLASTY UPPER EYELID; W/EXCESS SKIN BLEPHAROPTOSIS REPAIR; RESECT EX BILATERAL (Bilateral)  Patient Location: PACU  Anesthesia Type: MAC  Level of Consciousness: awake, alert  and patient cooperative  Airway and Oxygen Therapy: Patient Spontanous Breathing and Patient connected to supplemental oxygen  Post-op Assessment: Post-op Vital signs reviewed, Patient's Cardiovascular Status Stable, Respiratory Function Stable, Patent Airway and No signs of Nausea or vomiting  Post-op Vital Signs: Reviewed and stable  Complications: No notable events documented.

## 2023-01-20 NOTE — Anesthesia Preprocedure Evaluation (Addendum)
Anesthesia Evaluation  Patient identified by MRN, date of birth, ID band Patient awake    Reviewed: Allergy & Precautions, H&P , NPO status , Patient's Chart, lab work & pertinent test results  Airway Mallampati: IV  TM Distance: >3 FB Neck ROM: Full    Dental no notable dental hx. (+) Upper Dentures, Lower Dentures   Pulmonary asthma , COPD, former smoker   Pulmonary exam normal breath sounds clear to auscultation       Cardiovascular hypertension, +CHF  Normal cardiovascular exam Rhythm:Regular Rate:Normal     Neuro/Psych  PSYCHIATRIC DISORDERS Anxiety      Neuromuscular disease negative neurological ROS  negative psych ROS   GI/Hepatic negative GI ROS, Neg liver ROS, hiatal hernia,GERD  ,,  Endo/Other  negative endocrine ROSdiabetes    Renal/GU Renal diseasenegative Renal ROS  negative genitourinary   Musculoskeletal negative musculoskeletal ROS (+) Arthritis ,  Fibromyalgia -  Abdominal   Peds negative pediatric ROS (+)  Hematology negative hematology ROS (+) Blood dyscrasia, anemia   Anesthesia Other Findings  (gastroesophageal reflux disease) Tubular adenoma  HOH (hard of hearing) but can hear without heaing aids, so long as she can observe speaker's lips, and speaker enunciates slowly and clearly   Inflammatory arthritis Essential hypertension  CKD (chronic kidney disease) stage 3, GFR 30-59 ml/min (HCC) Pulmonary eosinophilia (HCC) 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HCC) Spinal stenosis  Iron deficiency anemia Compression fx, thoracic spine (HCC) COPD (chronic obstructive pulmonary disease) (HCC) Asthma  Diverticulitis Nausea with vomiting  Multilevel degenerative disc disease Wears hearing aid in both ears Wears dentures    Reproductive/Obstetrics negative OB ROS                             Anesthesia Physical Anesthesia Plan  ASA: 3  Anesthesia Plan: MAC   Post-op Pain  Management:    Induction: Intravenous  PONV Risk Score and Plan:   Airway Management Planned: Natural Airway and Nasal Cannula  Additional Equipment:   Intra-op Plan:   Post-operative Plan:   Informed Consent: I have reviewed the patients History and Physical, chart, labs and discussed the procedure including the risks, benefits and alternatives for the proposed anesthesia with the patient or authorized representative who has indicated his/her understanding and acceptance.     Dental Advisory Given  Plan Discussed with: Anesthesiologist, CRNA and Surgeon  Anesthesia Plan Comments: (Patient consented for risks of anesthesia including but not limited to:  - adverse reactions to medications - damage to eyes, teeth, lips or other oral mucosa - nerve damage due to positioning  - sore throat or hoarseness - Damage to heart, brain, nerves, lungs, other parts of body or loss of life  Patient voiced understanding and assent.)       Anesthesia Quick Evaluation

## 2023-01-21 NOTE — Anesthesia Postprocedure Evaluation (Signed)
Anesthesia Post Note  Patient: Glenda Terry  Procedure(s) Performed: BLEPHAROPLASTY UPPER EYELID; W/EXCESS SKIN BLEPHAROPTOSIS REPAIR; RESECT EX BILATERAL (Bilateral)  Patient location during evaluation: PACU Anesthesia Type: MAC Level of consciousness: awake and alert Pain management: pain level controlled Vital Signs Assessment: post-procedure vital signs reviewed and stable Respiratory status: spontaneous breathing, nonlabored ventilation, respiratory function stable and patient connected to nasal cannula oxygen Cardiovascular status: stable and blood pressure returned to baseline Postop Assessment: no apparent nausea or vomiting Anesthetic complications: no   No notable events documented.   Last Vitals:  Vitals:   01/20/23 1413 01/20/23 1429  BP:  135/75  Pulse: 80 77  Resp: 12 13  Temp: (!) 36.3 C (!) 36.3 C  SpO2: 95% 98%    Last Pain:  Vitals:   01/21/23 1101  TempSrc:   PainSc: 0-No pain                 Dian Laprade C Amario Longmore

## 2023-01-22 ENCOUNTER — Encounter: Payer: Self-pay | Admitting: Ophthalmology

## 2023-01-27 ENCOUNTER — Other Ambulatory Visit: Payer: Self-pay

## 2023-01-31 DIAGNOSIS — N9089 Other specified noninflammatory disorders of vulva and perineum: Secondary | ICD-10-CM | POA: Diagnosis not present

## 2023-02-02 DIAGNOSIS — M51361 Other intervertebral disc degeneration, lumbar region with lower extremity pain only: Secondary | ICD-10-CM | POA: Diagnosis not present

## 2023-02-02 DIAGNOSIS — N183 Chronic kidney disease, stage 3 unspecified: Secondary | ICD-10-CM | POA: Diagnosis not present

## 2023-02-02 DIAGNOSIS — M961 Postlaminectomy syndrome, not elsewhere classified: Secondary | ICD-10-CM | POA: Diagnosis not present

## 2023-02-02 DIAGNOSIS — M542 Cervicalgia: Secondary | ICD-10-CM | POA: Diagnosis not present

## 2023-02-02 DIAGNOSIS — M546 Pain in thoracic spine: Secondary | ICD-10-CM | POA: Diagnosis not present

## 2023-02-03 ENCOUNTER — Telehealth: Payer: Self-pay

## 2023-02-03 ENCOUNTER — Other Ambulatory Visit: Payer: Self-pay

## 2023-02-03 NOTE — Telephone Encounter (Signed)
Auth Submission: NO AUTH NEEDED Site of care: Site of care: [facility] Payer: [employer] Medication & CPT/J Code(s) submitted: Prolia (Denosumab) [id number] Route of submission (phone, fax, portal): phone Phone # Fax # Auth type: Buy/Bill PB Units/visits requested: 60mg , q42months Reference number: [id number] Approval from: 02/03/23 to 03/15/23

## 2023-02-06 ENCOUNTER — Other Ambulatory Visit: Payer: Self-pay | Admitting: General Practice

## 2023-02-07 ENCOUNTER — Encounter: Payer: PPO | Admitting: Adult Health

## 2023-02-07 ENCOUNTER — Telehealth: Payer: Self-pay

## 2023-02-07 MED ORDER — PANTOPRAZOLE SODIUM 40 MG PO TBEC: 40.0000 mg | DELAYED_RELEASE_TABLET | Freq: Every day | ORAL | 3 refills | Status: DC

## 2023-02-07 NOTE — Addendum Note (Signed)
Addended by: Gelene Mink on: 02/07/2023 02:39 PM   Modules accepted: Orders

## 2023-02-07 NOTE — Telephone Encounter (Signed)
Pt phoned and stated she needs a new Rx for Pantoprazole 40 mg to be sent to her pharmacy with her taking once a day instead of twice a day. Her last ov was 10/14/2022. Please advise

## 2023-02-07 NOTE — Telephone Encounter (Signed)
Completed.

## 2023-02-09 DIAGNOSIS — H11822 Conjunctivochalasis, left eye: Secondary | ICD-10-CM | POA: Diagnosis not present

## 2023-02-09 DIAGNOSIS — H11821 Conjunctivochalasis, right eye: Secondary | ICD-10-CM | POA: Diagnosis not present

## 2023-02-18 DIAGNOSIS — D7219 Other eosinophilia: Secondary | ICD-10-CM | POA: Diagnosis not present

## 2023-02-18 DIAGNOSIS — E1122 Type 2 diabetes mellitus with diabetic chronic kidney disease: Secondary | ICD-10-CM | POA: Diagnosis not present

## 2023-02-18 DIAGNOSIS — E538 Deficiency of other specified B group vitamins: Secondary | ICD-10-CM | POA: Diagnosis not present

## 2023-02-18 DIAGNOSIS — D509 Iron deficiency anemia, unspecified: Secondary | ICD-10-CM | POA: Diagnosis not present

## 2023-02-18 DIAGNOSIS — R252 Cramp and spasm: Secondary | ICD-10-CM | POA: Diagnosis not present

## 2023-02-18 DIAGNOSIS — J4550 Severe persistent asthma, uncomplicated: Secondary | ICD-10-CM | POA: Diagnosis not present

## 2023-02-23 NOTE — Progress Notes (Signed)
Cardiology Office Note:  .   Date:  02/28/2023  ID:  Glenda Terry, DOB September 07, 1940, MRN [id number] PCP: Benita Stabile, MD  [facility] Providers Cardiologist:  Parke Poisson, MD   }   History of Present Illness: .   Glenda Terry is a 82 y.o. female with a history of HTN, CKD stage 3, anxiety, COPD, cushing's syndrome, GERD, inflammatory arthritis, spinal stenosis, pulmonary eosinophilia, and HFpEF who presents for follow up. Her daughter Hector Shade is with her and provides collaborative history. Last seen in the office by Dr. Jacques Navy on 07/12/2022.  She can today with chronic complaints of noncardiac etiology.  She has severe arthritis which is causing her a good bit of pain in her back knees hands and feet.  She is being followed by [facility] for pain management.  She also has some episodes of GERD especially when laying down and this does cause a cough.  She does raise the head of her bed which is helpful and she is on a PPI.  She denies any issues with blood pressure control, chest pain, breathing status is improved with injections through pulmonary.   ROS: As above otherwise negative  Studies Reviewed: .   Echocardiogram 08/01/20: FINDINGS   Left Ventricle: Left ventricular ejection fraction, by estimation, is 60  to 65%. The left ventricle has normal function. The left ventricle has no  regional wall motion abnormalities. The left ventricular internal cavity  size was normal in size. There is   no left ventricular hypertrophy. Left ventricular diastolic parameters  are consistent with Grade I diastolic dysfunction (impaired relaxation).   Right Ventricle: The right ventricular size is normal. No increase in  right ventricular wall thickness. Right ventricular systolic function is  normal.   Left Atrium: Left atrial size was normal in size.   Right Atrium: Right atrial size was normal in size.   Pericardium: There is no evidence of pericardial effusion.    Mitral Valve: The mitral valve is normal in structure. No evidence of  mitral valve regurgitation.   Tricuspid Valve: The tricuspid valve is normal in structure. Tricuspid  valve regurgitation is not demonstrated. No evidence of tricuspid  stenosis.   Aortic Valve: The aortic valve is tricuspid. Aortic valve regurgitation is  not visualized. No aortic stenosis is present.   Pulmonic Valve: The pulmonic valve was normal in structure. Pulmonic valve  regurgitation is not visualized. No evidence of pulmonic stenosis.   Aorta: The aortic root and ascending aorta are structurally normal, with  no evidence of dilitation.   Venous: The inferior vena cava is normal in size with greater than 50%  respiratory variability, suggesting right atrial pressure of 3 mmHg.   IAS/Shunts: The atrial septum is grossly normal.    Non-Telemetry Monitoring 02/24/17:   Available strips from 7 day event recorder reviewed. There was only one returned strip that demonstrated sinus tachycardia.EKG Interpretation Date/Time:  Monday February 28 2023 11:12:33 EST Ventricular Rate:  81 PR Interval:  154 QRS Duration:  88 QT Interval:  388 QTC Calculation: 450 R Axis:   -6  Text Interpretation: Normal sinus rhythm Low voltage QRS When compared with ECG of 05-Nov-2021 18:58, PREVIOUS ECG IS PRESENT Confirmed by Joni Reining [phone]) on 02/28/2023 12:03:46 PM     Physical Exam:   VS:  BP 118/72 (BP Location: Left Arm, Patient Position: Sitting, Cuff Size: Normal)   Pulse 81   Ht 5' (1.524 m)   Wt 182 lb 3.2  oz (82.6 kg)   SpO2 99%   BMI 35.58 kg/m    Wt Readings from Last 3 Encounters:  02/28/23 182 lb 3.2 oz (82.6 kg)  01/20/23 179 lb 14.4 oz (81.6 kg)  10/14/22 174 lb 14.4 oz (79.3 kg)    GEN: Well nourished, well developed in no acute distress NECK: No JVD; No carotid bruits CARDIAC: RRR, soft systolic murmur heard best at the right sternal border, murmurs, rubs, gallops RESPIRATORY:  Clear  to auscultation without rales, wheezing or rhonchi  ABDOMEN: Soft, non-tend EXTREMITIES:  No edema; but there is deformity in hands and knees due to arthritis,   ASSESSMENT AND PLAN: .   Hypertension: Blood pressure is very well-controlled today despite her chronic pain.  I will not make any changes in her regimen at this time.  Labs are being followed by primary care.  As well as nephrology.  She should continue carvedilol, and amlodipine as directed.  2.  Chronic diastolic heart failure: Most recent echocardiogram shows normal LV systolic function with grade 1 diastolic dysfunction.  She does use torsemide 3 times a week and has good response and does not have any evidence of volume overload on this office visit.  Labs are followed closely by PCP and nephrology.  3.  Chronic kidney disease stage III: Followed by nephrology and by primary care.  Has had labs drawn within the last 2 weeks by Dr. Margo Aye, her primary care.  He did call her and let her know that things were stable according to the daughter who is with her.  Defer to primary care and nephrology.  On review of labs completed on 02/18/2023, her creatinine was 1.54 with a GFR of 54   4.  Arthritis: Having chronic pain despite medication for control through [facility].  She is on oxycodone as needed as well as Tylenol Extra Strength and topical creams.  Defer to orthopedics for ongoing management.         Signed, Bettey Mare. Liborio Nixon, ANP, AACC

## 2023-02-24 DIAGNOSIS — Z87311 Personal history of (healed) other pathological fracture: Secondary | ICD-10-CM | POA: Diagnosis not present

## 2023-02-24 DIAGNOSIS — E1122 Type 2 diabetes mellitus with diabetic chronic kidney disease: Secondary | ICD-10-CM | POA: Diagnosis not present

## 2023-02-24 DIAGNOSIS — I110 Hypertensive heart disease with heart failure: Secondary | ICD-10-CM | POA: Diagnosis not present

## 2023-02-24 DIAGNOSIS — M1990 Unspecified osteoarthritis, unspecified site: Secondary | ICD-10-CM | POA: Diagnosis not present

## 2023-02-24 DIAGNOSIS — M797 Fibromyalgia: Secondary | ICD-10-CM | POA: Diagnosis not present

## 2023-02-24 DIAGNOSIS — G5603 Carpal tunnel syndrome, bilateral upper limbs: Secondary | ICD-10-CM | POA: Diagnosis not present

## 2023-02-24 DIAGNOSIS — M810 Age-related osteoporosis without current pathological fracture: Secondary | ICD-10-CM | POA: Diagnosis not present

## 2023-02-24 DIAGNOSIS — E1142 Type 2 diabetes mellitus with diabetic polyneuropathy: Secondary | ICD-10-CM | POA: Diagnosis not present

## 2023-02-24 DIAGNOSIS — R600 Localized edema: Secondary | ICD-10-CM | POA: Diagnosis not present

## 2023-02-24 DIAGNOSIS — K068 Other specified disorders of gingiva and edentulous alveolar ridge: Secondary | ICD-10-CM | POA: Diagnosis not present

## 2023-02-24 DIAGNOSIS — M542 Cervicalgia: Secondary | ICD-10-CM | POA: Diagnosis not present

## 2023-02-25 DIAGNOSIS — N2581 Secondary hyperparathyroidism of renal origin: Secondary | ICD-10-CM | POA: Diagnosis not present

## 2023-02-25 DIAGNOSIS — I129 Hypertensive chronic kidney disease with stage 1 through stage 4 chronic kidney disease, or unspecified chronic kidney disease: Secondary | ICD-10-CM | POA: Diagnosis not present

## 2023-02-25 DIAGNOSIS — I5032 Chronic diastolic (congestive) heart failure: Secondary | ICD-10-CM | POA: Diagnosis not present

## 2023-02-25 DIAGNOSIS — N1832 Chronic kidney disease, stage 3b: Secondary | ICD-10-CM | POA: Diagnosis not present

## 2023-02-28 ENCOUNTER — Ambulatory Visit: Payer: PPO | Attending: Internal Medicine | Admitting: Adult Health

## 2023-02-28 ENCOUNTER — Encounter: Payer: Self-pay | Admitting: Adult Health

## 2023-02-28 VITALS — BP 118/72 | HR 81 | Ht 60.0 in | Wt 182.2 lb

## 2023-02-28 DIAGNOSIS — I10 Essential (primary) hypertension: Secondary | ICD-10-CM | POA: Diagnosis not present

## 2023-02-28 DIAGNOSIS — I5032 Chronic diastolic (congestive) heart failure: Secondary | ICD-10-CM | POA: Diagnosis not present

## 2023-02-28 DIAGNOSIS — N1832 Chronic kidney disease, stage 3b: Secondary | ICD-10-CM | POA: Diagnosis not present

## 2023-02-28 DIAGNOSIS — M069 Rheumatoid arthritis, unspecified: Secondary | ICD-10-CM | POA: Diagnosis not present

## 2023-02-28 NOTE — Patient Instructions (Signed)
Medication Instructions:  No changes *If you need a refill on your cardiac medications before your next appointment, please call your pharmacy*   Lab Work: No labs If you have labs (blood work) drawn today and your tests are completely normal, you will receive your results only by: MyChart Message (if you have MyChart) OR A paper copy in the mail If you have any lab test that is abnormal or we need to change your treatment, we will call you to review the results.   Testing/Procedures: No Testing   Follow-Up: At [facility], you and your health needs are our priority.  As part of our continuing mission to provide you with exceptional heart care, we have created designated Provider Care Teams.  These Care Teams include your primary Cardiologist (physician) and Advanced Practice Providers (APPs -  Physician Assistants and Nurse Practitioners) who all work together to provide you with the care you need, when you need it.  We recommend signing up for the patient portal called "MyChart".  Sign up information is provided on this After Visit Summary.  MyChart is used to connect with patients for Virtual Visits (Telemedicine).  Patients are able to view lab/test results, encounter notes, upcoming appointments, etc.  Non-urgent messages can be sent to your provider as well.   To learn more about what you can do with MyChart, go to [email or web address].    Your next appointment:   1 year(s)  Provider:   Parke Poisson, MD

## 2023-03-03 ENCOUNTER — Other Ambulatory Visit: Payer: Self-pay | Admitting: Cardiology

## 2023-03-03 DIAGNOSIS — J449 Chronic obstructive pulmonary disease, unspecified: Secondary | ICD-10-CM | POA: Diagnosis not present

## 2023-03-03 DIAGNOSIS — Z515 Encounter for palliative care: Secondary | ICD-10-CM | POA: Diagnosis not present

## 2023-03-03 DIAGNOSIS — N1832 Chronic kidney disease, stage 3b: Secondary | ICD-10-CM | POA: Diagnosis not present

## 2023-03-03 DIAGNOSIS — I509 Heart failure, unspecified: Secondary | ICD-10-CM | POA: Diagnosis not present

## 2023-03-03 DIAGNOSIS — I110 Hypertensive heart disease with heart failure: Secondary | ICD-10-CM | POA: Diagnosis not present

## 2023-03-03 DIAGNOSIS — E1122 Type 2 diabetes mellitus with diabetic chronic kidney disease: Secondary | ICD-10-CM | POA: Diagnosis not present

## 2023-03-03 DIAGNOSIS — Z7951 Long term (current) use of inhaled steroids: Secondary | ICD-10-CM | POA: Diagnosis not present

## 2023-03-03 DIAGNOSIS — Z1331 Encounter for screening for depression: Secondary | ICD-10-CM | POA: Diagnosis not present

## 2023-03-11 ENCOUNTER — Ambulatory Visit (HOSPITAL_COMMUNITY): Payer: PPO

## 2023-03-11 ENCOUNTER — Encounter: Payer: PPO | Attending: Internal Medicine | Admitting: Internal Medicine

## 2023-03-11 VITALS — BP 138/85 | HR 94 | Temp 97.8°F | Resp 18

## 2023-03-11 DIAGNOSIS — H1013 Acute atopic conjunctivitis, bilateral: Secondary | ICD-10-CM | POA: Diagnosis not present

## 2023-03-11 DIAGNOSIS — M3501 Sicca syndrome with keratoconjunctivitis: Secondary | ICD-10-CM | POA: Diagnosis not present

## 2023-03-11 DIAGNOSIS — H11821 Conjunctivochalasis, right eye: Secondary | ICD-10-CM | POA: Diagnosis not present

## 2023-03-11 DIAGNOSIS — M810 Age-related osteoporosis without current pathological fracture: Secondary | ICD-10-CM | POA: Diagnosis not present

## 2023-03-11 DIAGNOSIS — H11822 Conjunctivochalasis, left eye: Secondary | ICD-10-CM | POA: Diagnosis not present

## 2023-03-11 MED ORDER — DENOSUMAB 60 MG/ML ~~LOC~~ SOSY
60.0000 mg | PREFILLED_SYRINGE | Freq: Once | SUBCUTANEOUS | Status: AC
  Administered 2023-03-11: 60 mg via SUBCUTANEOUS

## 2023-03-11 NOTE — Progress Notes (Signed)
Diagnosis: Osteoporosis  Provider:  Dwana Melena MD  Procedure: Injection  Prolia (Denosumab), Dose: 100 mg, Site: subcutaneous, Number of injections: 1  Injection Site(s): Right deltoid  Post Care: Observation period completed  Discharge: Condition: Good, Destination: Home . AVS Provided  Performed by:  Cleotilde Neer, LPN

## 2023-03-14 ENCOUNTER — Other Ambulatory Visit: Payer: Self-pay | Admitting: Internal Medicine

## 2023-03-21 ENCOUNTER — Other Ambulatory Visit: Payer: Self-pay | Admitting: Pharmacy Technician

## 2023-04-08 ENCOUNTER — Telehealth: Payer: Self-pay

## 2023-04-08 DIAGNOSIS — H0014 Chalazion left upper eyelid: Secondary | ICD-10-CM | POA: Diagnosis not present

## 2023-04-08 NOTE — Telephone Encounter (Signed)
Auth Submission: NO AUTH NEEDED Site of care: Site of care: [facility] Payer: [employer] Medication & CPT/J Code(s) submitted: Prolia (Denosumab) [id number] Route of submission (phone, fax, portal): phone Phone # Fax # Auth type: Buy/Bill PB Units/visits requested: 60mg ,q86months Reference number: [id number] Approval from: 04/08/2023 to 03/14/2024

## 2023-04-14 ENCOUNTER — Encounter: Payer: Self-pay | Admitting: Gastroenterology

## 2023-04-15 DIAGNOSIS — D7219 Other eosinophilia: Secondary | ICD-10-CM | POA: Diagnosis not present

## 2023-04-15 DIAGNOSIS — J4550 Severe persistent asthma, uncomplicated: Secondary | ICD-10-CM | POA: Diagnosis not present

## 2023-04-21 DIAGNOSIS — J441 Chronic obstructive pulmonary disease with (acute) exacerbation: Secondary | ICD-10-CM | POA: Diagnosis not present

## 2023-04-25 ENCOUNTER — Telehealth: Payer: Self-pay | Admitting: Gastroenterology

## 2023-05-10 ENCOUNTER — Encounter: Payer: Self-pay | Admitting: Internal Medicine

## 2023-05-10 ENCOUNTER — Telehealth: Payer: Self-pay | Admitting: Internal Medicine

## 2023-05-10 ENCOUNTER — Encounter (HOSPITAL_COMMUNITY): Payer: Self-pay | Admitting: Emergency Medicine

## 2023-05-10 ENCOUNTER — Emergency Department (HOSPITAL_COMMUNITY): Payer: PPO

## 2023-05-10 ENCOUNTER — Emergency Department (HOSPITAL_COMMUNITY)
Admission: EM | Admit: 2023-05-10 | Discharge: 2023-05-10 | Disposition: A | Discharge status: Home / Self Care | Payer: PPO | Attending: Emergency Medicine | Admitting: Emergency Medicine

## 2023-05-10 ENCOUNTER — Other Ambulatory Visit: Payer: Self-pay

## 2023-05-10 DIAGNOSIS — Z96611 Presence of right artificial shoulder joint: Secondary | ICD-10-CM | POA: Diagnosis not present

## 2023-05-10 DIAGNOSIS — J45901 Unspecified asthma with (acute) exacerbation: Secondary | ICD-10-CM

## 2023-05-10 DIAGNOSIS — J449 Chronic obstructive pulmonary disease, unspecified: Secondary | ICD-10-CM | POA: Insufficient documentation

## 2023-05-10 DIAGNOSIS — Z20822 Contact with and (suspected) exposure to covid-19: Secondary | ICD-10-CM | POA: Diagnosis not present

## 2023-05-10 DIAGNOSIS — R000 Tachycardia, unspecified: Secondary | ICD-10-CM | POA: Diagnosis not present

## 2023-05-10 DIAGNOSIS — Z96612 Presence of left artificial shoulder joint: Secondary | ICD-10-CM | POA: Diagnosis not present

## 2023-05-10 DIAGNOSIS — Z7951 Long term (current) use of inhaled steroids: Secondary | ICD-10-CM | POA: Insufficient documentation

## 2023-05-10 DIAGNOSIS — Z79899 Other long term (current) drug therapy: Secondary | ICD-10-CM | POA: Insufficient documentation

## 2023-05-10 DIAGNOSIS — N189 Chronic kidney disease, unspecified: Secondary | ICD-10-CM | POA: Insufficient documentation

## 2023-05-10 DIAGNOSIS — J4531 Mild persistent asthma with (acute) exacerbation: Secondary | ICD-10-CM | POA: Diagnosis not present

## 2023-05-10 DIAGNOSIS — R0602 Shortness of breath: Secondary | ICD-10-CM | POA: Diagnosis not present

## 2023-05-10 DIAGNOSIS — K449 Diaphragmatic hernia without obstruction or gangrene: Secondary | ICD-10-CM | POA: Diagnosis not present

## 2023-05-10 DIAGNOSIS — R0789 Other chest pain: Secondary | ICD-10-CM | POA: Diagnosis not present

## 2023-05-10 LAB — CBC WITH DIFFERENTIAL/PLATELET
Abs Immature Granulocytes: 0.04 10*3/uL (ref 0.00–0.07)
Basophils Absolute: 0 10*3/uL (ref 0.0–0.1)
Basophils Relative: 0 %
Eosinophils Absolute: 0 10*3/uL (ref 0.0–0.5)
Eosinophils Relative: 0 %
HCT: 36.4 % (ref 36.0–46.0)
Hemoglobin: 11.4 g/dL — ABNORMAL LOW (ref 12.0–15.0)
Immature Granulocytes: 0 %
Lymphocytes Relative: 30 %
Lymphs Abs: 3 10*3/uL (ref 0.7–4.0)
MCH: 24.8 pg — ABNORMAL LOW (ref 26.0–34.0)
MCHC: 31.3 g/dL (ref 30.0–36.0)
MCV: 79.1 fL — ABNORMAL LOW (ref 80.0–100.0)
Monocytes Absolute: 0.6 10*3/uL (ref 0.1–1.0)
Monocytes Relative: 6 %
Neutro Abs: 6.5 10*3/uL (ref 1.7–7.7)
Neutrophils Relative %: 64 %
Platelets: 243 10*3/uL (ref 150–400)
RBC: 4.6 MIL/uL (ref 3.87–5.11)
RDW: 15.6 % — ABNORMAL HIGH (ref 11.5–15.5)
WBC: 10.2 10*3/uL (ref 4.0–10.5)
nRBC: 0.2 % (ref 0.0–0.2)

## 2023-05-10 LAB — TROPONIN I (HIGH SENSITIVITY)
Troponin I (High Sensitivity): 13 ng/L (ref <18)
Troponin I (High Sensitivity): 13 ng/L (ref <18)

## 2023-05-10 LAB — RESP PANEL BY RT-PCR (RSV, FLU A&B, COVID)  RVPGX2
Influenza A by PCR: NEGATIVE
Influenza B by PCR: NEGATIVE
Resp Syncytial Virus by PCR: NEGATIVE
SARS Coronavirus 2 by RT PCR: NEGATIVE

## 2023-05-10 LAB — BASIC METABOLIC PANEL
Anion gap: 11 (ref 5–15)
BUN: 22 mg/dL (ref 8–23)
CO2: 29 mmol/L (ref 22–32)
Calcium: 9 mg/dL (ref 8.9–10.3)
Chloride: 97 mmol/L — ABNORMAL LOW (ref 98–111)
Creatinine, Ser: 1.4 mg/dL — ABNORMAL HIGH (ref 0.44–1.00)
GFR, Estimated: 38 mL/min — ABNORMAL LOW (ref >60)
Glucose, Bld: 119 mg/dL — ABNORMAL HIGH (ref 70–99)
Potassium: 4 mmol/L (ref 3.5–5.1)
Sodium: 137 mmol/L (ref 135–145)

## 2023-05-10 LAB — BRAIN NATRIURETIC PEPTIDE: B Natriuretic Peptide: 34 pg/mL (ref 0.0–100.0)

## 2023-05-10 MED ORDER — ALBUTEROL SULFATE HFA 108 (90 BASE) MCG/ACT IN AERS
2.0000 | INHALATION_SPRAY | RESPIRATORY_TRACT | Status: DC | PRN
  Administered 2023-05-10: 2 via RESPIRATORY_TRACT
  Filled 2023-05-10: qty 6.7

## 2023-05-10 MED ORDER — DEXAMETHASONE SODIUM PHOSPHATE 10 MG/ML IJ SOLN: 10.0000 mg | Freq: Once | INTRAMUSCULAR | Status: DC

## 2023-05-10 MED ORDER — METHYLPREDNISOLONE SODIUM SUCC 125 MG IJ SOLR: 125.0000 mg | Freq: Once | INTRAMUSCULAR | Status: DC

## 2023-05-10 MED ORDER — PREDNISONE 50 MG PO TABS
60.0000 mg | ORAL_TABLET | Freq: Once | ORAL | Status: AC
  Administered 2023-05-10: 60 mg via ORAL
  Filled 2023-05-10: qty 1

## 2023-05-10 MED ORDER — PREDNISONE 10 MG (21) PO TBPK: ORAL_TABLET | Freq: Every day | ORAL | 0 refills | Status: DC

## 2023-05-10 NOTE — Telephone Encounter (Signed)
 Pt c/o swelling: STAT is pt has developed SOB within 24 hours  How much weight have you gained and in what time span? No  If swelling, where is the swelling located? Calfs and ankles in both legs   Are you currently taking a fluid pill? Yes but pt hasn't really been urinating since Sunday   Are you currently SOB? No  Do you have a log of your daily weights (if so, list)? No  Have you gained 3 pounds in a day or 5 pounds in a week? Not sure   Have you traveled recently? No    Pt c/o of Chest Pain: STAT if CP now or developed within 24 hours  1. Are you having CP right now? Yes, it feels very tight   2. Are you experiencing any other symptoms (ex. SOB, nausea, vomiting, sweating)? No  3. How long have you been experiencing CP? Since yesterday  4. Is your CP continuous or coming and going? Coming and going   5. Have you taken Nitroglycerin? No ?

## 2023-05-10 NOTE — ED Triage Notes (Signed)
 Daughter reports pt started c/o tightness and heaviness on chest starting Saturday. Pt c/o leg tightness (fluid). Pt reports she has not passed much urine. Pt reports she is eating and drinking normally. She has taken her fluid medications, but is still not urinating. She is unsure if she feels pressure in her bladder. Pt is labored in triage. O2 sat is 98% on room air. Pt has a hx of significant asthma

## 2023-05-10 NOTE — Discharge Instructions (Addendum)
 You were seen in the urgency room for shortness of breath.  Your COVID-19, flu test are negative.  No evidence of heart attack or heart failure or pneumonia seen.  We suspect that this could be asthma exacerbation, likely because of viral illness.  Please take the medications as prescribed.  Give yourself inhaler every 2-4 hours as needed.   Please return to the ER if you have worsening chest pain, shortness of breath, pain radiating to your jaw, shoulder, or back, sweats or fainting, fevers.  If your symptoms do not improve, then consider following up with cardiology.  We have put in referral to cardiology service, also expect a call from them to have you be seen in the clinic.

## 2023-05-10 NOTE — Telephone Encounter (Signed)
 Spoke to patient and daughter   Patient states she has been having chest tightness  not chest pain   She states it last about 10 minutes-it comes and goes.  She could not tell if it occurs with rest or activity.  It has been occurring off and on since Sunday. No shortness of breath  No radiation of discomfort  Patient does states she has  leg  and feet tightness( patient says this constant)  Today's blood pressure 121/73  and pulse 79.  Weight has been 186.2 lb for the last  2 days.   Patient has not an do not have NTG  Sublingual tablet.   Appointment schedule for 05/12/23  with [place] PA. Daughter verbalized understanding.  Daughter and patient are aware if symptoms worsen to go to ER For evaluation. Daughters states she will check on her mother after work.

## 2023-05-10 NOTE — ED Provider Triage Note (Signed)
 Emergency Medicine Provider Triage Evaluation Note  Glenda Terry , a 83 y.o. female  was evaluated in triage.  Pt complains of chest pain and shortness of breath, ongoing for several days. History of CHF, eosinophilic asthma, chronic pain.  Review of Systems  Positive: Cough, chest pain, LE edema, shortenss of breath Negative: Abdominal pain  Physical Exam  BP (!) 157/94 (BP Location: Right Arm)   Pulse 89   Temp 98.5 F (36.9 C) (Oral)   Resp (!) 22   SpO2 100%  Gen:   Awake, appears uncomfortable Resp:  Mild tachypnea MSK:   Moves extremities without difficulty, edema of lower extremities  Other:  Back pain, chronic  Medical Decision Making  Medically screening exam initiated at 8:37 PM.  Appropriate orders placed.  Glenda Terry was informed that the remainder of the evaluation will be completed by another provider, this initial triage assessment does not replace that evaluation, and the importance of remaining in the ED until their evaluation is complete.     Felicie Morn, NP 05/10/23 2113

## 2023-05-10 NOTE — ED Provider Notes (Signed)
 [place] EMERGENCY DEPARTMENT AT [facility] Provider Note   CSN: [id number] Arrival date & time: 05/10/23  1810     History  Chief Complaint  Patient presents with   Shortness of Breath   Chest Pain    Glenda Terry is a 83 y.o. female.  HPI     83 year old female with history of eosinophilic asthma/lungs, CKD, rheumatoid arthritis, COPD comes in with chief complaint of chest tightness and shortness of breath.  Patient states that she has had the chest tightness and heaviness since last 3 days.  Symptoms are constant.  She has a cough, which is a hallmark of her eosinophilic lungs.  She denies any new fevers, chills, congestion.  Patient has no known history of coronary artery disease.  Her pain is nonradiating.  Her pain is not worse with exertion.  Patient also indicates that she has noticed slightly increased swelling in her legs.  She has no history of PE, DVT and denies any hormone use, long distance travels or major surgeries in the past 6 weeks.  Home Medications Prior to Admission medications   Medication Sig Start Date End Date Taking? Authorizing Provider  doxycycline (VIBRA-TABS) 100 MG tablet Take 100 mg by mouth 2 (two) times daily. 04/21/23  Yes [provider]  predniSONE (STERAPRED UNI-PAK 21 TAB) 10 MG (21) TBPK tablet Take by mouth daily. Take 6 tabs by mouth daily  for 2 days, then 5 tabs for 2 days, then 4 tabs for 2 days, then 3 tabs for 2 days, 2 tabs for 2 days, then 1 tab by mouth daily for 2 days 05/10/23  Yes Derwood Kaplan, MD  acetaminophen (TYLENOL) 500 MG tablet Take 1,000 mg by mouth every 6 (six) hours as needed for moderate pain.    [provider]  albuterol (PROAIR HFA) 108 (90 Base) MCG/ACT inhaler Inhale 2 puffs into the lungs every 6 (six) hours as needed for wheezing or shortness of breath (For COPD).     [provider]  ALPRAZolam Prudy Feeler) 0.5 MG tablet Take 0.5 tablets (0.25 mg total) by mouth 2  (two) times daily as needed for anxiety. 10/25/19   Sharee Holster, NP  amLODipine (NORVASC) 5 MG tablet TAKE ONE TABLET BY MOUTH ONCE DAILY. 03/03/23   Rollene Rotunda, MD  Benralizumab 30 MG/ML SOSY Inject 30 mg into the skin See admin instructions. Inject 30 mg into the skin every 56 Days 10/17/19   [provider]  calcitRIOL (ROCALTROL) 0.25 MCG capsule Take 0.25 mcg by mouth 2 (two) times a week. 09/29/22   [provider]  calcium carbonate (OS-CAL) 600 MG TABS tablet Take 600 mg by mouth 2 (two) times daily with a meal. Patient not taking: Reported on 02/28/2023    [provider]  carvedilol (COREG) 6.25 MG tablet Take 1 tablet (6.25 mg total) by mouth 2 (two) times daily with a meal. 12/23/22   Parke Poisson, MD  Cholecalciferol (VITAMIN D3) 25 MCG (1000 UT) CAPS Take by mouth 2 (two) times a week. Patient not taking: Reported on 02/28/2023    [provider]  clobetasol ointment (TEMOVATE) 0.05 % Apply 1 Application topically at bedtime. 05/03/23   [provider]  cyanocobalamin 2000 MCG tablet Take 2,000 mcg by mouth daily. 09/25/21   [provider]  denosumab (PROLIA) 60 MG/ML SOSY injection Inject 60 mg into the skin every 6 (six) months. 03/01/18   [provider]  diphenhydrAMINE (BENADRYL) 25 mg  capsule Take 25 mg by mouth at bedtime.    [provider]  DULERA 200-5 MCG/ACT AERO Inhale 2 puffs into the lungs 2 (two) times daily. 10/25/19   Sharee Holster, NP  ezetimibe (ZETIA) 10 MG tablet Take 10 mg by mouth daily. 08/02/22   [provider]  ferrous sulfate 325 (65 FE) MG tablet Take 1 tablet (325 mg total) by mouth daily with breakfast. 10/25/19   Sharee Holster, NP  fexofenadine (ALLEGRA) 180 MG tablet Take 180 mg by mouth daily.    [provider]  fluticasone (FLONASE) 50 MCG/ACT nasal spray Place 1 spray into both nostrils daily as needed for allergies or rhinitis.  10/17/19    [provider]  ipratropium-albuterol (DUONEB) 0.5-2.5 (3) MG/3ML SOLN Take 3 mLs by nebulization every 4 (four) hours as needed (shortness of breath).    [provider]  ketotifen (ZADITOR) 0.025 % ophthalmic solution Place 1 drop into both eyes 2 (two) times daily as needed (allergies). 10/25/19   Sharee Holster, NP  mirabegron ER (MYRBETRIQ) 25 MG TB24 tablet Take 1 tablet (25 mg total) by mouth daily. Patient not taking: Reported on 02/28/2023 09/09/22   Donnita Falls, FNP  montelukast (SINGULAIR) 10 MG tablet Take 1 tablet (10 mg total) by mouth at bedtime. 07/25/20   Parke Poisson, MD  Oxycodone HCl 10 MG TABS Take 10 mg by mouth every 6 (six) hours as needed (pain). 12/29/19   [provider]  pantoprazole (PROTONIX) 40 MG tablet Take 1 tablet (40 mg total) by mouth daily. 02/07/23   Gelene Mink, NP  Polyethyl Glycol-Propyl Glycol (SYSTANE OP) Place 1 drop into both eyes 3 (three) times daily as needed (dry/irritated eyes.).  Patient not taking: Reported on 02/28/2023    [provider]  polyethylene glycol (MIRALAX / GLYCOLAX) 17 g packet Take 17 g by mouth daily. Patient taking differently: Take 17 g by mouth daily as needed for mild constipation. 11/07/20   Tegeler, Canary Brim, MD  potassium chloride (KLOR-CON) 10 MEQ tablet Take 40 meq(4 tab) on Monday, Wednesday and Friday, all other days take 20 meq(2 tab) by mouth daily, Take additional 42meq(2tab) today, and 27meq(6tab) x3days then back to regular dosing 06/04/22   Parke Poisson, MD  pravastatin (PRAVACHOL) 10 MG tablet Take 10 mg by mouth daily. 03/27/20   [provider]  torsemide (DEMADEX) 20 MG tablet TAKE 2 TABLETS BY MOUTH ON MONDAY, WEDNESDAY, AND FRIDAY. ALL OTHER DAYS TAKE 1 TABLET DAILY. 02/07/23   Parke Poisson, MD  traMADol Janean Sark) 50 MG tablet Take 1 every 4-6 hours as needed for pain not controlled by Tylenol 01/20/23   Imagene Riches, MD  vitamin C  (ASCORBIC ACID) 500 MG tablet Take 500 mg by mouth daily.    [provider]      Allergies    Flexeril [cyclobenzaprine], Other, Keflex [cephalexin], Aspirin, Erythromycin, Fentanyl, Hydrocodone, Adhesive [tape], and Chlorhexidine    Review of Systems   Review of Systems  Physical Exam Updated Vital Signs BP 130/75   Pulse 84   Temp 98.5 F (36.9 C) (Oral)   Resp 17   SpO2 99%  Physical Exam  ED Results / Procedures / Treatments   Labs (all labs ordered are listed, but only abnormal results are displayed) Labs Reviewed  BASIC METABOLIC PANEL - Abnormal; Notable for the following components:      Result Value   Chloride 97 (*)  Glucose, Bld 119 (*)    Creatinine, Ser 1.40 (*)    GFR, Estimated 38 (*)    All other components within normal limits  CBC WITH DIFFERENTIAL/PLATELET - Abnormal; Notable for the following components:   Hemoglobin 11.4 (*)    MCV 79.1 (*)    MCH 24.8 (*)    RDW 15.6 (*)    All other components within normal limits  RESP PANEL BY RT-PCR (RSV, FLU A&B, COVID)  RVPGX2  BRAIN NATRIURETIC PEPTIDE  TROPONIN I (HIGH SENSITIVITY)  TROPONIN I (HIGH SENSITIVITY)    EKG EKG Interpretation Date/Time:  Tuesday May 10 2023 18:30:30 EST Ventricular Rate:  106 PR Interval:  148 QRS Duration:  84 QT Interval:  348 QTC Calculation: 462 R Axis:   27  Text Interpretation: Sinus tachycardia Otherwise normal ECG When compared with ECG of 28-Feb-2023 11:12, Nonspecific T wave abnormality no longer evident in Anterior leads No significant change since last tracing Confirmed by Derwood Kaplan ([id number]) on 05/10/2023 11:12:53 PM  Radiology DG Chest 2 View Result Date: 05/10/2023 CLINICAL DATA:  Chest tightness. EXAM: CHEST - 2 VIEW COMPARISON:  Aug 12, 2022 FINDINGS: The heart size and mediastinal contours are within normal limits. There is no evidence of an acute infiltrate, pleural effusion or pneumothorax. A stable, small to moderate sized hiatal  hernia is noted. There is evidence of prior bilateral shoulder arthroplasty. IMPRESSION: 1. No active cardiopulmonary disease. 2. Stable, small to moderate sized hiatal hernia. Electronically Signed   By: Aram Candela M.D.   On: 05/10/2023 19:14    Procedures Procedures    Medications Ordered in ED Medications  albuterol (VENTOLIN HFA) 108 (90 Base) MCG/ACT inhaler 2 puff (2 puffs Inhalation Given 05/10/23 1848)  predniSONE (DELTASONE) tablet 60 mg (60 mg Oral Given 05/10/23 2314)    ED Course/ Medical Decision Making/ A&P             HEART Score: 3                    Medical Decision Making Amount and/or Complexity of Data Reviewed Radiology: ordered.  Risk Prescription drug management.  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    The differential diagnosis includes : Acute coronary syndrome, pulmonary embolism, COPD exacerbation, pneumonia, flareup of eosinophilic lung disease/bronchitis.  Patient's chest tightness is constant, which makes it atypical for ACS, as it has been present for 3 days now.  Patient has no signs of DVT/no unilateral swelling and denies any worsening of pain with deep inspiration.  She has no history of PE, DVT.  Per care everywhere, patient had ultrasound lower extremity in 2023 at outside system which was negative.  Shared decision making with the patient, she is comfortable not pursuing PE workup at this time, as her symptoms are more consistent with her eosinophilic lung disease.  The initial plan is to get basic labs.  Chest x-ray, flu-COVID.  Patient had already received nebulizer treatment prior to my assessment.  She already felt better when she had received  it.  Daughter is at the bedside.  She states that patient had received doxycycline few days back.  There is no fevers, discoloration of the phlegm.  Patient does not think she needs any more  antibiotics.  Additional history obtained: Additional history obtained from family Records reviewed previous admission documents and Care Everywhere/External Records  Independent labs interpretation:  The following labs were independently interpreted: CBC shows no leukocytosis.  Metabolic profile is normal.  Creatinine is at baseline level for the patient.  Initial troponin is 13.  Independent visualization and interpretation of imaging: - I independently visualized the following imaging with scope of interpretation limited to determining acute life threatening conditions related to emergency care: X-ray of the chest, which revealed no evidence of focal consolidation or pneumonia.  Treatment and Reassessment: Patient comfortable with outpatient follow-up with cardiology if her symptoms do not get better. She will return to the ER if her symptoms get worse.  Final Clinical Impression(s) / ED Diagnoses Final diagnoses:  Exacerbation of asthma, unspecified asthma severity, unspecified whether persistent    Rx / DC Orders ED Discharge Orders          Ordered    predniSONE (STERAPRED UNI-PAK 21 TAB) 10 MG (21) TBPK tablet  Daily        05/10/23 2306    Ambulatory referral to Cardiology       Comments: If you have not heard from the Cardiology office within the next 72 hours please call [phone].   05/10/23 2310              Derwood Kaplan, MD 05/10/23 2318

## 2023-05-11 NOTE — Telephone Encounter (Signed)
 Thank you :)

## 2023-05-12 ENCOUNTER — Encounter: Payer: Self-pay | Admitting: Physician Assistant

## 2023-05-12 ENCOUNTER — Encounter: Payer: Self-pay | Admitting: Internal Medicine

## 2023-05-12 ENCOUNTER — Ambulatory Visit: Payer: PPO | Attending: Physician Assistant | Admitting: Physician Assistant

## 2023-05-12 VITALS — BP 126/82 | HR 80 | Ht 60.0 in | Wt 189.6 lb

## 2023-05-12 DIAGNOSIS — I878 Other specified disorders of veins: Secondary | ICD-10-CM | POA: Diagnosis not present

## 2023-05-12 DIAGNOSIS — J8283 Eosinophilic asthma: Secondary | ICD-10-CM

## 2023-05-12 DIAGNOSIS — R0789 Other chest pain: Secondary | ICD-10-CM

## 2023-05-12 NOTE — Patient Instructions (Signed)
 Medication Instructions:  NO CHANGES *If you need a refill on your cardiac medications before your next appointment, please call your pharmacy*   Lab Work: NO LABS If you have labs (blood work) drawn today and your tests are completely normal, you will receive your results only by: MyChart Message (if you have MyChart) OR A paper copy in the mail If you have any lab test that is abnormal or we need to change your treatment, we will call you to review the results.   Testing/Procedures: NO TESTING   Follow-Up: At [facility], you and your health needs are our priority.  As part of our continuing mission to provide you with exceptional heart care, we have created designated Provider Care Teams.  These Care Teams include your primary Cardiologist (physician) and Advanced Practice Providers (APPs -  Physician Assistants and Nurse Practitioners) who all work together to provide you with the care you need, when you need it.   Your next appointment:   6 month(s)  Provider:   Parke Poisson, MD

## 2023-05-12 NOTE — Progress Notes (Unsigned)
 Cardiology Office Note:  .   Date:  05/13/2023  ID:  Glenda Terry, DOB May 15, 1940, MRN [id number] PCP: Benita Stabile, MD   [facility] Providers Cardiologist:  Parke Poisson, MD     History of Present Illness: .   Glenda Terry is a 83 y.o. female with PMH of COPD, Cushing syndrome, anxiety, hypertension, CKD stage III, GERD, inflammatory arthritis, spinal stenosis, pulmonary eosinophilia and HFpEF.  Myoview in December 2004 was low risk.  Heart monitor in 2014 showed sinus rhythm and sinus tach.  Echocardiogram obtained on 08/01/2020 showed EF 60 to 65%, grade 1 DD.  She is on torsemide for lower extremity edema.  She contacted cardiology service on 05/10/2023 was chest tightness.  She eventually went to the emergency room on the same day.  She complained of cough which is a hallmark of her eosinophilic lungs.  Chest x-ray showed no acute disease, small to moderate size hiatal hernia.  Creatinine was 1.4 which was mildly higher than baseline of 1.2.  BNP was 34.  Viral panel negative.  CBC showed hemoglobin of 11.4.  Serial troponin negative x 2.  Patient was treated with 60 mg of prednisone and nebulizer treatment.  Family mentioned that patient received a course of doxycycline a few days prior to the ED visit.  Patient presents today along with family.  She is concerned about lower extremity edema and brownish discoloration in the lower extremity, this appears to be venous stasis.  She is wearing compression stocking.  We discussed fluid limitation of 32 to 64 ounces per day.  I also recommended leg elevation.  She does not have any claudication symptom.  She denies any chest pain.  The pounding sensation and tightness which she experienced during respiratory failure has resolved.  I decided to hold off on additional workup.  She has follow-up with her pulmonologist.  Overall, she is stable from the cardiac perspective.  EKG is normal.  She can follow-up with Dr. Jacques Navy in 6  months.  ROS:   The chest tightness and shortness of breath has improved.  She complains of lower extremity discoloration and swelling.  Studies Reviewed: Marland Kitchen   EKG Interpretation Date/Time:  Thursday May 12 2023 15:12:44 EST Ventricular Rate:  80 PR Interval:  140 QRS Duration:  82 QT Interval:  378 QTC Calculation: 435 R Axis:   32  Text Interpretation: Normal sinus rhythm Normal ECG When compared with ECG of 10-May-2023 18:30, No significant change was found Confirmed by Azalee Course [id number]) on 05/13/2023 8:28:47 PM    Cardiac Studies & Procedures   ______________________________________________________________________________________________     ECHOCARDIOGRAM  ECHOCARDIOGRAM COMPLETE 08/01/2020  Narrative ECHOCARDIOGRAM REPORT    Patient Name:   Glenda Terry Date of Exam: 08/01/2020 Medical Rec #:  [id number]           Height:       59.0 in Accession #:    [id number]          Weight:       193.6 lb Date of Birth:  [birth year]            BSA:          1.819 m Patient Age:    79 years            BP:           146/78 mmHg Patient Gender: F  HR:           74 bpm. Exam Location:  [address]  Procedure: 2D Echo, Cardiac Doppler and Color Doppler  Indications:    I10 Hypertension  History:        Patient has prior history of Echocardiogram examinations, most recent 09/22/2015. Signs/Symptoms:LE edema; Risk Factors:Hypertension.  Sonographer:    Samule Ohm RDCS Referring Phys: [id number] Lynda Rainwater A ACHARYA  IMPRESSIONS   1. Left ventricular ejection fraction, by estimation, is 60 to 65%. The left ventricle has normal function. The left ventricle has no regional wall motion abnormalities. Left ventricular diastolic parameters are consistent with Grade I diastolic dysfunction (impaired relaxation). 2. Right ventricular systolic function is normal. The right ventricular size is normal. 3. The mitral valve is normal in structure. No evidence of  mitral valve regurgitation. 4. The aortic valve is tricuspid. Aortic valve regurgitation is not visualized. No aortic stenosis is present. 5. The inferior vena cava is normal in size with greater than 50% respiratory variability, suggesting right atrial pressure of 3 mmHg.  Comparison(s): A prior study was performed on 09/22/2015. No significant change from prior study. Prior images reviewed side by side.  FINDINGS Left Ventricle: Left ventricular ejection fraction, by estimation, is 60 to 65%. The left ventricle has normal function. The left ventricle has no regional wall motion abnormalities. The left ventricular internal cavity size was normal in size. There is no left ventricular hypertrophy. Left ventricular diastolic parameters are consistent with Grade I diastolic dysfunction (impaired relaxation).  Right Ventricle: The right ventricular size is normal. No increase in right ventricular wall thickness. Right ventricular systolic function is normal.  Left Atrium: Left atrial size was normal in size.  Right Atrium: Right atrial size was normal in size.  Pericardium: There is no evidence of pericardial effusion.  Mitral Valve: The mitral valve is normal in structure. No evidence of mitral valve regurgitation.  Tricuspid Valve: The tricuspid valve is normal in structure. Tricuspid valve regurgitation is not demonstrated. No evidence of tricuspid stenosis.  Aortic Valve: The aortic valve is tricuspid. Aortic valve regurgitation is not visualized. No aortic stenosis is present.  Pulmonic Valve: The pulmonic valve was normal in structure. Pulmonic valve regurgitation is not visualized. No evidence of pulmonic stenosis.  Aorta: The aortic root and ascending aorta are structurally normal, with no evidence of dilitation.  Venous: The inferior vena cava is normal in size with greater than 50% respiratory variability, suggesting right atrial pressure of 3 mmHg.  IAS/Shunts: The atrial septum  is grossly normal.   LEFT VENTRICLE PLAX 2D LVIDd:         4.50 cm  Diastology LVIDs:         3.30 cm  LV e' medial:    6.85 cm/s LV PW:         0.80 cm  LV E/e' medial:  8.0 LV IVS:        1.10 cm  LV e' lateral:   8.92 cm/s LVOT diam:     2.00 cm  LV E/e' lateral: 6.1 LV SV:         51 LV SV Index:   28 LVOT Area:     3.14 cm   RIGHT VENTRICLE             IVC RV S prime:     11.60 cm/s  IVC diam: 1.40 cm TAPSE (M-mode): 2.0 cm RVSP:           23.1 mmHg  LEFT  ATRIUM             Index       RIGHT ATRIUM           Index LA diam:        3.30 cm 1.81 cm/m  RA Pressure: 3.00 mmHg LA Vol (A2C):   38.2 ml 21.00 ml/m RA Area:     13.50 cm LA Vol (A4C):   37.9 ml 20.84 ml/m RA Volume:   34.60 ml  19.02 ml/m LA Biplane Vol: 40.0 ml 21.99 ml/m AORTIC VALVE LVOT Vmax:   78.30 cm/s LVOT Vmean:  56.400 cm/s LVOT VTI:    0.163 m  AORTA Ao Root diam: 3.00 cm Ao Asc diam:  3.20 cm  MV E velocity: 54.80 cm/s  TRICUSPID VALVE MV A velocity: 72.00 cm/s  TR Peak grad:   20.1 mmHg MV E/A ratio:  0.76        TR Vmax:        224.00 cm/s Estimated RAP:  3.00 mmHg RVSP:           23.1 mmHg  SHUNTS Systemic VTI:  0.16 m Systemic Diam: 2.00 cm  Riley Lam MD Electronically signed by Riley Lam MD Signature Date/Time: 08/01/2020/6:21:18 PM    Final    MONITORS  CARDIAC EVENT MONITOR 02/24/2017  Narrative Available strips from 7 day event recorder reviewed. There was only one returned strip that demonstrated sinus tachycardia.       ______________________________________________________________________________________________      Risk Assessment/Calculations:             Physical Exam:   VS:  BP 126/82 (BP Location: Left Arm, Patient Position: Sitting, Cuff Size: Normal)   Pulse 80   Ht 5' (1.524 m)   Wt 189 lb 9.6 oz (86 kg)   SpO2 94%   BMI 37.03 kg/m    Wt Readings from Last 3 Encounters:  05/12/23 189 lb 9.6 oz (86 kg)  02/28/23 182 lb  3.2 oz (82.6 kg)  01/20/23 179 lb 14.4 oz (81.6 kg)    GEN: Well nourished, well developed in no acute distress NECK: No JVD; No carotid bruits CARDIAC: RRR, no murmurs, rubs, gallops RESPIRATORY:  Clear to auscultation without rales, wheezing or rhonchi  ABDOMEN: Soft, non-tender, non-distended EXTREMITIES:  No edema; No deformity   ASSESSMENT AND PLAN: .     Venous Stasis Non-pitting edema in lower extremities with brownish discoloration. No pain on walking. -Continue leg elevation and compression stockings.  Chest tightness: Described as pounding sensation in the chest during shortness of breath.  Since her shortness of breath improved and treated with prednisone, chest tightness has also resolved.  Will continue observation.  Pulmonary Eosinophilia Recent exacerbation with chest tightness and cough, treated with prednisone 60mg  and nebulizer treatment. No active wheezing or crackles on examination. -Continue current treatment plan. -Follow up with pulmonologist.       Dispo: Follow-up with Dr. Jacques Navy in 6 months  Signed, Azalee Course, [place]

## 2023-05-17 DIAGNOSIS — J45901 Unspecified asthma with (acute) exacerbation: Secondary | ICD-10-CM | POA: Diagnosis not present

## 2023-05-17 DIAGNOSIS — Z7689 Persons encountering health services in other specified circumstances: Secondary | ICD-10-CM | POA: Diagnosis not present

## 2023-05-19 ENCOUNTER — Ambulatory Visit: Payer: PPO | Admitting: Gastroenterology

## 2023-05-20 DIAGNOSIS — H524 Presbyopia: Secondary | ICD-10-CM | POA: Diagnosis not present

## 2023-05-20 DIAGNOSIS — H5211 Myopia, right eye: Secondary | ICD-10-CM | POA: Diagnosis not present

## 2023-05-20 DIAGNOSIS — H26492 Other secondary cataract, left eye: Secondary | ICD-10-CM | POA: Diagnosis not present

## 2023-05-20 DIAGNOSIS — H04123 Dry eye syndrome of bilateral lacrimal glands: Secondary | ICD-10-CM | POA: Diagnosis not present

## 2023-05-20 DIAGNOSIS — H11823 Conjunctivochalasis, bilateral: Secondary | ICD-10-CM | POA: Diagnosis not present

## 2023-05-27 DIAGNOSIS — N898 Other specified noninflammatory disorders of vagina: Secondary | ICD-10-CM | POA: Diagnosis not present

## 2023-05-27 DIAGNOSIS — J8283 Eosinophilic asthma: Secondary | ICD-10-CM | POA: Diagnosis not present

## 2023-05-27 DIAGNOSIS — N763 Subacute and chronic vulvitis: Secondary | ICD-10-CM | POA: Diagnosis not present

## 2023-05-27 DIAGNOSIS — N9089 Other specified noninflammatory disorders of vulva and perineum: Secondary | ICD-10-CM | POA: Diagnosis not present

## 2023-05-27 DIAGNOSIS — Z90710 Acquired absence of both cervix and uterus: Secondary | ICD-10-CM | POA: Diagnosis not present

## 2023-06-02 ENCOUNTER — Other Ambulatory Visit: Payer: Self-pay

## 2023-06-02 MED ORDER — POTASSIUM CHLORIDE ER 10 MEQ PO TBCR
EXTENDED_RELEASE_TABLET | ORAL | 3 refills | Status: AC
Start: 2023-06-02 — End: ?

## 2023-06-10 DIAGNOSIS — D7219 Other eosinophilia: Secondary | ICD-10-CM | POA: Diagnosis not present

## 2023-06-10 DIAGNOSIS — J4550 Severe persistent asthma, uncomplicated: Secondary | ICD-10-CM | POA: Diagnosis not present

## 2023-06-10 DIAGNOSIS — J301 Allergic rhinitis due to pollen: Secondary | ICD-10-CM | POA: Diagnosis not present

## 2023-06-10 DIAGNOSIS — J449 Chronic obstructive pulmonary disease, unspecified: Secondary | ICD-10-CM | POA: Diagnosis not present

## 2023-06-14 DIAGNOSIS — M545 Low back pain, unspecified: Secondary | ICD-10-CM | POA: Diagnosis not present

## 2023-06-14 DIAGNOSIS — M542 Cervicalgia: Secondary | ICD-10-CM | POA: Diagnosis not present

## 2023-06-14 DIAGNOSIS — M898X1 Other specified disorders of bone, shoulder: Secondary | ICD-10-CM | POA: Diagnosis not present

## 2023-06-14 DIAGNOSIS — M546 Pain in thoracic spine: Secondary | ICD-10-CM | POA: Diagnosis not present

## 2023-06-17 DIAGNOSIS — E538 Deficiency of other specified B group vitamins: Secondary | ICD-10-CM | POA: Diagnosis not present

## 2023-06-17 DIAGNOSIS — E1122 Type 2 diabetes mellitus with diabetic chronic kidney disease: Secondary | ICD-10-CM | POA: Diagnosis not present

## 2023-06-17 DIAGNOSIS — N189 Chronic kidney disease, unspecified: Secondary | ICD-10-CM | POA: Diagnosis not present

## 2023-06-17 DIAGNOSIS — R252 Cramp and spasm: Secondary | ICD-10-CM | POA: Diagnosis not present

## 2023-06-17 DIAGNOSIS — D509 Iron deficiency anemia, unspecified: Secondary | ICD-10-CM | POA: Diagnosis not present

## 2023-06-24 ENCOUNTER — Other Ambulatory Visit (HOSPITAL_COMMUNITY): Payer: Self-pay | Admitting: Family Medicine

## 2023-06-24 DIAGNOSIS — M1990 Unspecified osteoarthritis, unspecified site: Secondary | ICD-10-CM | POA: Diagnosis not present

## 2023-06-24 DIAGNOSIS — I130 Hypertensive heart and chronic kidney disease with heart failure and stage 1 through stage 4 chronic kidney disease, or unspecified chronic kidney disease: Secondary | ICD-10-CM | POA: Diagnosis not present

## 2023-06-24 DIAGNOSIS — R600 Localized edema: Secondary | ICD-10-CM | POA: Diagnosis not present

## 2023-06-24 DIAGNOSIS — Z87311 Personal history of (healed) other pathological fracture: Secondary | ICD-10-CM | POA: Diagnosis not present

## 2023-06-24 DIAGNOSIS — M79605 Pain in left leg: Secondary | ICD-10-CM

## 2023-06-24 DIAGNOSIS — M810 Age-related osteoporosis without current pathological fracture: Secondary | ICD-10-CM | POA: Diagnosis not present

## 2023-06-24 DIAGNOSIS — E782 Mixed hyperlipidemia: Secondary | ICD-10-CM | POA: Diagnosis not present

## 2023-06-24 DIAGNOSIS — M797 Fibromyalgia: Secondary | ICD-10-CM | POA: Diagnosis not present

## 2023-06-24 DIAGNOSIS — G629 Polyneuropathy, unspecified: Secondary | ICD-10-CM | POA: Diagnosis not present

## 2023-06-24 DIAGNOSIS — M542 Cervicalgia: Secondary | ICD-10-CM | POA: Diagnosis not present

## 2023-06-24 DIAGNOSIS — K068 Other specified disorders of gingiva and edentulous alveolar ridge: Secondary | ICD-10-CM | POA: Diagnosis not present

## 2023-06-24 DIAGNOSIS — G5603 Carpal tunnel syndrome, bilateral upper limbs: Secondary | ICD-10-CM | POA: Diagnosis not present

## 2023-07-01 DIAGNOSIS — Z7184 Encounter for health counseling related to travel: Secondary | ICD-10-CM | POA: Diagnosis not present

## 2023-07-01 DIAGNOSIS — J4550 Severe persistent asthma, uncomplicated: Secondary | ICD-10-CM | POA: Diagnosis not present

## 2023-07-01 DIAGNOSIS — J8283 Eosinophilic asthma: Secondary | ICD-10-CM | POA: Diagnosis not present

## 2023-07-01 DIAGNOSIS — J301 Allergic rhinitis due to pollen: Secondary | ICD-10-CM | POA: Diagnosis not present

## 2023-07-06 ENCOUNTER — Ambulatory Visit (HOSPITAL_COMMUNITY)
Admission: RE | Admit: 2023-07-06 | Discharge: 2023-07-06 | Disposition: A | Discharge status: Home / Self Care | Source: Ambulatory Visit | Attending: Family Medicine | Admitting: Family Medicine

## 2023-07-06 DIAGNOSIS — M79605 Pain in left leg: Secondary | ICD-10-CM | POA: Insufficient documentation

## 2023-07-06 DIAGNOSIS — M79604 Pain in right leg: Secondary | ICD-10-CM | POA: Diagnosis not present

## 2023-07-06 DIAGNOSIS — E1151 Type 2 diabetes mellitus with diabetic peripheral angiopathy without gangrene: Secondary | ICD-10-CM | POA: Diagnosis not present

## 2023-07-06 DIAGNOSIS — I10 Essential (primary) hypertension: Secondary | ICD-10-CM | POA: Diagnosis not present

## 2023-07-06 DIAGNOSIS — E785 Hyperlipidemia, unspecified: Secondary | ICD-10-CM | POA: Diagnosis not present

## 2023-07-08 DIAGNOSIS — J8283 Eosinophilic asthma: Secondary | ICD-10-CM | POA: Diagnosis not present

## 2023-07-08 DIAGNOSIS — J4550 Severe persistent asthma, uncomplicated: Secondary | ICD-10-CM | POA: Diagnosis not present

## 2023-07-14 DIAGNOSIS — I5032 Chronic diastolic (congestive) heart failure: Secondary | ICD-10-CM | POA: Diagnosis not present

## 2023-07-14 DIAGNOSIS — E1122 Type 2 diabetes mellitus with diabetic chronic kidney disease: Secondary | ICD-10-CM | POA: Diagnosis not present

## 2023-07-14 DIAGNOSIS — I10 Essential (primary) hypertension: Secondary | ICD-10-CM | POA: Diagnosis not present

## 2023-07-14 DIAGNOSIS — E782 Mixed hyperlipidemia: Secondary | ICD-10-CM | POA: Diagnosis not present

## 2023-07-15 ENCOUNTER — Ambulatory Visit (HOSPITAL_COMMUNITY)

## 2023-07-15 ENCOUNTER — Other Ambulatory Visit: Payer: Self-pay

## 2023-07-15 ENCOUNTER — Emergency Department (HOSPITAL_COMMUNITY)

## 2023-07-15 ENCOUNTER — Emergency Department (HOSPITAL_COMMUNITY)
Admission: EM | Admit: 2023-07-15 | Discharge: 2023-07-15 | Disposition: A | Discharge status: Home / Self Care | Attending: Emergency Medicine | Admitting: Emergency Medicine

## 2023-07-15 DIAGNOSIS — R0689 Other abnormalities of breathing: Secondary | ICD-10-CM | POA: Diagnosis not present

## 2023-07-15 DIAGNOSIS — M19011 Primary osteoarthritis, right shoulder: Secondary | ICD-10-CM | POA: Diagnosis not present

## 2023-07-15 DIAGNOSIS — W19XXXA Unspecified fall, initial encounter: Secondary | ICD-10-CM | POA: Insufficient documentation

## 2023-07-15 DIAGNOSIS — S81811A Laceration without foreign body, right lower leg, initial encounter: Secondary | ICD-10-CM | POA: Diagnosis not present

## 2023-07-15 DIAGNOSIS — Z96651 Presence of right artificial knee joint: Secondary | ICD-10-CM | POA: Diagnosis not present

## 2023-07-15 DIAGNOSIS — R0602 Shortness of breath: Secondary | ICD-10-CM | POA: Insufficient documentation

## 2023-07-15 DIAGNOSIS — Z471 Aftercare following joint replacement surgery: Secondary | ICD-10-CM | POA: Diagnosis not present

## 2023-07-15 DIAGNOSIS — M25511 Pain in right shoulder: Secondary | ICD-10-CM | POA: Diagnosis not present

## 2023-07-15 DIAGNOSIS — Z96612 Presence of left artificial shoulder joint: Secondary | ICD-10-CM | POA: Diagnosis not present

## 2023-07-15 DIAGNOSIS — M4317 Spondylolisthesis, lumbosacral region: Secondary | ICD-10-CM | POA: Diagnosis not present

## 2023-07-15 DIAGNOSIS — Z96611 Presence of right artificial shoulder joint: Secondary | ICD-10-CM | POA: Diagnosis not present

## 2023-07-15 DIAGNOSIS — R2241 Localized swelling, mass and lump, right lower limb: Secondary | ICD-10-CM | POA: Diagnosis not present

## 2023-07-15 DIAGNOSIS — D72829 Elevated white blood cell count, unspecified: Secondary | ICD-10-CM | POA: Diagnosis not present

## 2023-07-15 DIAGNOSIS — M25551 Pain in right hip: Secondary | ICD-10-CM | POA: Diagnosis not present

## 2023-07-15 DIAGNOSIS — M438X6 Other specified deforming dorsopathies, lumbar region: Secondary | ICD-10-CM | POA: Diagnosis not present

## 2023-07-15 DIAGNOSIS — J80 Acute respiratory distress syndrome: Secondary | ICD-10-CM | POA: Diagnosis not present

## 2023-07-15 DIAGNOSIS — Z043 Encounter for examination and observation following other accident: Secondary | ICD-10-CM | POA: Diagnosis not present

## 2023-07-15 DIAGNOSIS — M79604 Pain in right leg: Secondary | ICD-10-CM | POA: Diagnosis not present

## 2023-07-15 DIAGNOSIS — M25559 Pain in unspecified hip: Secondary | ICD-10-CM | POA: Diagnosis not present

## 2023-07-15 DIAGNOSIS — M4856XA Collapsed vertebra, not elsewhere classified, lumbar region, initial encounter for fracture: Secondary | ICD-10-CM | POA: Diagnosis not present

## 2023-07-15 DIAGNOSIS — R062 Wheezing: Secondary | ICD-10-CM | POA: Diagnosis not present

## 2023-07-15 DIAGNOSIS — M8580 Other specified disorders of bone density and structure, unspecified site: Secondary | ICD-10-CM | POA: Diagnosis not present

## 2023-07-15 DIAGNOSIS — M546 Pain in thoracic spine: Secondary | ICD-10-CM | POA: Diagnosis not present

## 2023-07-15 DIAGNOSIS — R000 Tachycardia, unspecified: Secondary | ICD-10-CM | POA: Diagnosis not present

## 2023-07-15 LAB — COMPREHENSIVE METABOLIC PANEL WITH GFR
ALT: 36 U/L (ref 0–44)
AST: 42 U/L — ABNORMAL HIGH (ref 15–41)
Albumin: 3.9 g/dL (ref 3.5–5.0)
Alkaline Phosphatase: 45 U/L (ref 38–126)
Anion gap: 14 (ref 5–15)
BUN: 17 mg/dL (ref 8–23)
CO2: 24 mmol/L (ref 22–32)
Calcium: 9 mg/dL (ref 8.9–10.3)
Chloride: 100 mmol/L (ref 98–111)
Creatinine, Ser: 1.34 mg/dL — ABNORMAL HIGH (ref 0.44–1.00)
GFR, Estimated: 40 mL/min — ABNORMAL LOW (ref >60)
Glucose, Bld: 111 mg/dL — ABNORMAL HIGH (ref 70–99)
Potassium: 3.9 mmol/L (ref 3.5–5.1)
Sodium: 138 mmol/L (ref 135–145)
Total Bilirubin: 0.8 mg/dL (ref 0.0–1.2)
Total Protein: 6.9 g/dL (ref 6.5–8.1)

## 2023-07-15 LAB — I-STAT VENOUS BLOOD GAS, ED
Acid-Base Excess: 6 mmol/L — ABNORMAL HIGH (ref 0.0–2.0)
Bicarbonate: 31.4 mmol/L — ABNORMAL HIGH (ref 20.0–28.0)
Calcium, Ion: 1.08 mmol/L — ABNORMAL LOW (ref 1.15–1.40)
HCT: 41 % (ref 36.0–46.0)
Hemoglobin: 13.9 g/dL (ref 12.0–15.0)
O2 Saturation: 98 %
Potassium: 3.2 mmol/L — ABNORMAL LOW (ref 3.5–5.1)
Sodium: 139 mmol/L (ref 135–145)
TCO2: 33 mmol/L — ABNORMAL HIGH (ref 22–32)
pCO2, Ven: 47.2 mmHg (ref 44–60)
pH, Ven: 7.43 (ref 7.25–7.43)
pO2, Ven: 98 mmHg — ABNORMAL HIGH (ref 32–45)

## 2023-07-15 LAB — CBC WITH DIFFERENTIAL/PLATELET
Abs Immature Granulocytes: 0.06 10*3/uL (ref 0.00–0.07)
Basophils Absolute: 0 10*3/uL (ref 0.0–0.1)
Basophils Relative: 0 %
Eosinophils Absolute: 0 10*3/uL (ref 0.0–0.5)
Eosinophils Relative: 0 %
HCT: 42.2 % (ref 36.0–46.0)
Hemoglobin: 13 g/dL (ref 12.0–15.0)
Immature Granulocytes: 1 %
Lymphocytes Relative: 29 %
Lymphs Abs: 3.8 10*3/uL (ref 0.7–4.0)
MCH: 24.8 pg — ABNORMAL LOW (ref 26.0–34.0)
MCHC: 30.8 g/dL (ref 30.0–36.0)
MCV: 80.4 fL (ref 80.0–100.0)
Monocytes Absolute: 0.8 10*3/uL (ref 0.1–1.0)
Monocytes Relative: 6 %
Neutro Abs: 8.5 10*3/uL — ABNORMAL HIGH (ref 1.7–7.7)
Neutrophils Relative %: 64 %
Platelets: 295 10*3/uL (ref 150–400)
RBC: 5.25 MIL/uL — ABNORMAL HIGH (ref 3.87–5.11)
RDW: 15.4 % (ref 11.5–15.5)
WBC: 13.2 10*3/uL — ABNORMAL HIGH (ref 4.0–10.5)
nRBC: 0.2 % (ref 0.0–0.2)

## 2023-07-15 LAB — BRAIN NATRIURETIC PEPTIDE: B Natriuretic Peptide: 52.2 pg/mL (ref 0.0–100.0)

## 2023-07-15 LAB — TROPONIN I (HIGH SENSITIVITY)
Troponin I (High Sensitivity): 33 ng/L — ABNORMAL HIGH (ref <18)
Troponin I (High Sensitivity): 36 ng/L — ABNORMAL HIGH (ref <18)

## 2023-07-15 MED ORDER — BACITRACIN ZINC 500 UNIT/GM EX OINT
TOPICAL_OINTMENT | Freq: Once | CUTANEOUS | Status: AC
  Filled 2023-07-15: qty 1.8

## 2023-07-15 MED ORDER — FAMOTIDINE IN NACL 20-0.9 MG/50ML-% IV SOLN
20.0000 mg | Freq: Once | INTRAVENOUS | Status: AC
  Administered 2023-07-15: 20 mg via INTRAVENOUS
  Filled 2023-07-15: qty 50

## 2023-07-15 MED ORDER — PREDNISONE 10 MG (21) PO TBPK: ORAL_TABLET | Freq: Every day | ORAL | 0 refills | Status: DC

## 2023-07-15 MED ORDER — METHYLPREDNISOLONE SODIUM SUCC 125 MG IJ SOLR
125.0000 mg | Freq: Once | INTRAMUSCULAR | Status: AC
  Administered 2023-07-15: 125 mg via INTRAVENOUS
  Filled 2023-07-15: qty 2

## 2023-07-15 MED ORDER — DIPHENHYDRAMINE HCL 50 MG/ML IJ SOLN
25.0000 mg | Freq: Once | INTRAMUSCULAR | Status: AC
  Administered 2023-07-15: 25 mg via INTRAVENOUS
  Filled 2023-07-15: qty 1

## 2023-07-15 MED ORDER — IPRATROPIUM-ALBUTEROL 0.5-2.5 (3) MG/3ML IN SOLN
3.0000 mL | Freq: Once | RESPIRATORY_TRACT | Status: AC
  Administered 2023-07-15: 3 mL via RESPIRATORY_TRACT
  Filled 2023-07-15: qty 3

## 2023-07-15 NOTE — Discharge Instructions (Addendum)
 You were seen in the ED today for shortness of breath and evaluation after fall.  There were no emergency causes of your symptoms today.  Please follow-up with PCP by Monday for reassessment and for the wound to your right lower extremity.  Please continue taking your medications as prescribed, and take steroids that I prescribed you.  Please return to the ED for any emergency medical symptoms.  Please perform dressing changes to the right leg once daily.

## 2023-07-15 NOTE — ED Triage Notes (Signed)
 Pt to ED from airport via EMS with SOB. Pt went on trip to [facility] via airplane with half her prescribed meds d/t not wanting to urinate as often. SOB since this morning, intermittently. Pt was at airport and had mechanical fall. No thinners. C/o right shoulder pain. Denies hitting head. +wheezing   No O2 @ baseline. +coughing  10 of albuterol  & atrovent   200/120  HR 115  97% RA- on neb  H/o COPD, CHF, and asthma.

## 2023-07-15 NOTE — ED Notes (Signed)
 Pt ambulated with pulse oximetry. Lowest O2 sats were 95% RA. Pt ambulated well with assistance.

## 2023-07-15 NOTE — ED Provider Notes (Signed)
 [place] EMERGENCY DEPARTMENT AT [facility] Provider Note   CSN: [id number] Arrival date & time: 07/15/23  1652     History  Chief Complaint  Patient presents with   Shortness of Breath   Fall   HPI  Glenda Terry is a 83 y.o. female with PMHx history of eosinophilic asthma/lungs, CKD, rheumatoid arthritis, COPD, CHF presents due to shortness of breath and evaluation after fall.  Patient was reportedly on vacation for 1 week, had halved her torsemide  daily dose so that she would not urinate as much.  She began feeling more short of breath throughout the week.  Flight duration was about 2 hours each way.  While obtaining her bags at the airport today, she also had a ground-level fall landing on her right shoulder, endorsing pain to the area.  Denies any her head or losing consciousness.  Does not take any blood thinners.  While with EMS, she also became more short of breath, requiring nebulized treatments.  HPI     Home Medications Prior to Admission medications   Medication Sig Start Date End Date Taking? Authorizing Provider  acetaminophen  (TYLENOL ) 500 MG tablet Take 1,000 mg by mouth every 6 (six) hours as needed for moderate pain. Patient not taking: Reported on 05/12/2023    [provider]  albuterol  (PROAIR  HFA) 108 (90 Base) MCG/ACT inhaler Inhale 2 puffs into the lungs every 6 (six) hours as needed for wheezing or shortness of breath (For COPD).     [provider]  ALPRAZolam  (XANAX ) 0.5 MG tablet Take 0.5 tablets (0.25 mg total) by mouth 2 (two) times daily as needed for anxiety. 10/25/19   Marilyne Shu, NP  amLODipine  (NORVASC ) 5 MG tablet TAKE ONE TABLET BY MOUTH ONCE DAILY. 03/03/23   Eilleen Grates, MD  Benralizumab  30 MG/ML SOSY Inject 30 mg into the skin See admin instructions. Inject 30 mg into the skin every 56 Days 10/17/19   [provider]  calcitRIOL (ROCALTROL) 0.25 MCG capsule Take 0.25 mcg by mouth 2 (two) times a  week. 09/29/22   [provider]  calcium  carbonate (OS-CAL) 600 MG TABS tablet Take 600 mg by mouth 2 (two) times daily with a meal. Patient not taking: Reported on 02/28/2023    [provider]  carvedilol  (COREG ) 6.25 MG tablet Take 1 tablet (6.25 mg total) by mouth 2 (two) times daily with a meal. 12/23/22   Euell Herrlich, MD  Cholecalciferol (VITAMIN D3) 25 MCG (1000 UT) CAPS Take by mouth 2 (two) times a week.    [provider]  clobetasol ointment (TEMOVATE) 0.05 % Apply 1 Application topically at bedtime. Patient not taking: Reported on 05/12/2023 05/03/23   [provider]  cyanocobalamin  2000 MCG tablet Take 2,000 mcg by mouth daily. 09/25/21   [provider]  denosumab  (PROLIA ) 60 MG/ML SOSY injection Inject 60 mg into the skin every 6 (six) months. 03/01/18   [provider]  diphenhydrAMINE  (BENADRYL ) 25 mg capsule Take 25 mg by mouth at bedtime.    [provider]  doxycycline  (VIBRA -TABS) 100 MG tablet Take 100 mg by mouth 2 (two) times daily. Patient not taking: Reported on 05/12/2023 04/21/23   [provider]  DULERA  200-5 MCG/ACT AERO Inhale 2 puffs into the lungs 2 (two) times daily. 10/25/19   Marilyne Shu, NP  ezetimibe (ZETIA) 10 MG tablet Take 10 mg by mouth daily. 08/02/22   [provider]  ferrous sulfate  325 (65  FE) MG tablet Take 1 tablet (325 mg total) by mouth daily with breakfast. 10/25/19   Marilyne Shu, NP  fexofenadine (ALLEGRA) 180 MG tablet Take 180 mg by mouth daily.    [provider]  fluticasone  (FLONASE ) 50 MCG/ACT nasal spray Place 1 spray into both nostrils daily as needed for allergies or rhinitis.  10/17/19   [provider]  ipratropium-albuterol  (DUONEB) 0.5-2.5 (3) MG/3ML SOLN Take 3 mLs by nebulization every 4 (four) hours as needed (shortness of breath).    [provider]  ketotifen  (ZADITOR ) 0.025 % ophthalmic solution Place 1 drop into  both eyes 2 (two) times daily as needed (allergies). 10/25/19   Marilyne Shu, NP  mirabegron  ER (MYRBETRIQ ) 25 MG TB24 tablet Take 1 tablet (25 mg total) by mouth daily. Patient not taking: Reported on 05/12/2023 09/09/22   Lauretta Ponto, FNP  montelukast  (SINGULAIR ) 10 MG tablet Take 1 tablet (10 mg total) by mouth at bedtime. 07/25/20   Acharya, Gayatri A, MD  Oxycodone  HCl 10 MG TABS Take 10 mg by mouth every 6 (six) hours as needed (pain). 12/29/19   [provider]  pantoprazole  (PROTONIX ) 40 MG tablet Take 1 tablet (40 mg total) by mouth daily. 02/07/23   Delman Ferns, NP  Polyethyl Glycol-Propyl Glycol (SYSTANE OP) Place 1 drop into both eyes 3 (three) times daily as needed (dry/irritated eyes.).  Patient not taking: Reported on 02/28/2023    [provider]  polyethylene glycol (MIRALAX  / GLYCOLAX ) 17 g packet Take 17 g by mouth daily. Patient not taking: Reported on 05/12/2023 11/07/20   Tegeler, Marine Sia, MD  potassium chloride  (KLOR-CON ) 10 MEQ tablet Take 40 meq(4 tab) on Monday, Wednesday and Friday, all other days take 20 meq(2 tab) by mouth daily 06/02/23   Meng, Hao, PA  pravastatin  (PRAVACHOL ) 10 MG tablet Take 10 mg by mouth daily. 03/27/20   [provider]  predniSONE  (STERAPRED UNI-PAK 21 TAB) 10 MG (21) TBPK tablet Take by mouth daily. Take 6 tabs by mouth daily  for 2 days, then 5 tabs for 2 days, then 4 tabs for 2 days, then 3 tabs for 2 days, 2 tabs for 2 days, then 1 tab by mouth daily for 2 days 07/15/23   Lorain Robson, MD  torsemide  (DEMADEX ) 20 MG tablet TAKE 2 TABLETS BY MOUTH ON MONDAY, WEDNESDAY, AND FRIDAY. ALL OTHER DAYS TAKE 1 TABLET DAILY. 02/07/23   Acharya, Gayatri A, MD  traMADol  (ULTRAM ) 50 MG tablet Take 1 every 4-6 hours as needed for pain not controlled by Tylenol  01/20/23   Zacarias Hermann, MD  vitamin C (ASCORBIC ACID ) 500 MG tablet Take 500 mg by mouth daily.    [provider]      Allergies    Flexeril  [cyclobenzaprine], Other, Keflex  [cephalexin ], Aspirin, Erythromycin , Fentanyl , Adhesive [tape], and Chlorhexidine     Review of Systems   Review of Systems  Physical Exam Updated Vital Signs BP 134/78   Pulse 94   Temp 98.5 F (36.9 C) (Oral)   Resp 15   SpO2 100%  Physical Exam Vitals and nursing note reviewed.  Constitutional:      General: She is not in acute distress.    Appearance: She is well-developed.  HENT:     Head: Normocephalic and atraumatic.     Right Ear: External ear normal.     Left Ear: External ear normal.     Nose: Nose normal.     Mouth/Throat:  Mouth: Mucous membranes are moist.  Eyes:     Extraocular Movements: Extraocular movements intact.     Conjunctiva/sclera: Conjunctivae normal.  Cardiovascular:     Rate and Rhythm: Normal rate and regular rhythm.     Heart sounds: No murmur heard. Pulmonary:     Breath sounds: Normal breath sounds. No rhonchi or rales.     Comments: Diffuse end expiratory wheezing, dry cough during exam Abdominal:     General: There is no distension.     Palpations: Abdomen is soft.     Tenderness: There is no abdominal tenderness. There is no guarding or rebound.  Musculoskeletal:        General: No swelling.     Cervical back: Neck supple.     Comments: Paraspinal T-spine tenderness on palpation with no overlying skin changes, no step-offs, no direct T-spine or L-spine tenderness Pelvis stable to lateral compression Swelling noted to the right ankle with full ROM, no skin breaks, palpable DP and PT pulses  Skin:    General: Skin is warm and dry.     Capillary Refill: Capillary refill takes less than 2 seconds.     Comments: Skin tear noted to the right shin, subcu fat noted  Neurological:     Mental Status: She is alert.  Psychiatric:        Mood and Affect: Mood normal.     ED Results / Procedures / Treatments   Labs (all labs ordered are listed, but only abnormal results are displayed) Labs Reviewed  CBC  WITH DIFFERENTIAL/PLATELET - Abnormal; Notable for the following components:      Result Value   WBC 13.2 (*)    RBC 5.25 (*)    MCH 24.8 (*)    Neutro Abs 8.5 (*)    All other components within normal limits  COMPREHENSIVE METABOLIC PANEL WITH GFR - Abnormal; Notable for the following components:   Glucose, Bld 111 (*)    Creatinine, Ser 1.34 (*)    AST 42 (*)    GFR, Estimated 40 (*)    All other components within normal limits  I-STAT VENOUS BLOOD GAS, ED - Abnormal; Notable for the following components:   pO2, Ven 98 (*)    Bicarbonate 31.4 (*)    TCO2 33 (*)    Acid-Base Excess 6.0 (*)    Potassium 3.2 (*)    Calcium , Ion 1.08 (*)    All other components within normal limits  TROPONIN I (HIGH SENSITIVITY) - Abnormal; Notable for the following components:   Troponin I (High Sensitivity) 33 (*)    All other components within normal limits  TROPONIN I (HIGH SENSITIVITY) - Abnormal; Notable for the following components:   Troponin I (High Sensitivity) 36 (*)    All other components within normal limits  BRAIN NATRIURETIC PEPTIDE    EKG None  Radiology DG Thoracic Spine 2 View Result Date: 07/15/2023 CLINICAL DATA:  Right hip pain after fall EXAM: THORACIC SPINE 2 VIEWS COMPARISON:  Thoracic spine MRI 11/02/2022 FINDINGS: Right convex lower thoracic and lumbar curve. No evidence of traumatic listhesis. 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 Chronic compression fractures of T10 and T12. Lumbar spine findings reported separately. IMPRESSION: 1. No definite acute fracture in the thoracic spine. 2. Chronic compression fractures of T10, T11, and T12. Electronically Signed   By: Rozell Cornet M.D.   On: 07/15/2023 19:05   DG Lumbar Spine Complete Result Date: 07/15/2023 CLINICAL DATA:  Fall EXAM: LUMBAR SPINE -  COMPLETE 4+ VIEW COMPARISON:  CT abdomen and pelvis 06/11/2022 FINDINGS: There is 5 mm of anterolisthesis at L5-S1 which has slightly progressed.  Alignment is otherwise anatomic. The bones are osteopenic. There are mild compression deformities of T12, L1 and L4 which are similar to prior. Vertebroplasty changes of T11 are again noted. There is mild levoconvex curvature of the lower lumbar spine. IMPRESSION: 1. No acute fracture identified. 2. Stable mild compression deformities of T12, L1 and L4. 3. 5 mm of anterolisthesis at L5-S1 has slightly progressed. Electronically Signed   By: Tyron Gallon M.D.   On: 07/15/2023 19:00   DG Pelvis 1-2 Views Result Date: 07/15/2023 CLINICAL DATA:  Hip pain EXAM: PELVIS - 1-2 VIEW COMPARISON:  None Available. FINDINGS: There is no evidence of pelvic fracture or diastasis. No pelvic bone lesions are seen. IMPRESSION: Negative. Electronically Signed   By: Tyron Gallon M.D.   On: 07/15/2023 18:58   DG Tibia/Fibula Right Result Date: 07/15/2023 CLINICAL DATA:  Right leg pain after fall. EXAM: RIGHT TIBIA AND FIBULA - 2 VIEW COMPARISON:  None Available. FINDINGS: Status post right total knee arthroplasty. No fracture or dislocation is noted. No soft tissue abnormality is noted. IMPRESSION: No acute abnormality seen. Electronically Signed   By: Rosalene Colon M.D.   On: 07/15/2023 17:30   DG Chest Portable 1 View Result Date: 07/15/2023 CLINICAL DATA:  Shortness of breath.  Fall. EXAM: PORTABLE CHEST 1 VIEW COMPARISON:  May 10, 2023. FINDINGS: The heart size and mediastinal contours are within normal limits. Both lungs are clear. Status post bilateral shoulder arthroplasties. IMPRESSION: No active disease. Electronically Signed   By: Rosalene Colon M.D.   On: 07/15/2023 17:28   DG Shoulder Right Result Date: 07/15/2023 CLINICAL DATA:  Right shoulder pain after fall. EXAM: RIGHT SHOULDER - 2+ VIEW COMPARISON:  May 24, 2014. FINDINGS: Status post right shoulder arthroplasty. Mild degenerative changes seen involving the right acromioclavicular joint. No fracture or dislocation is noted. IMPRESSION: Chronic  findings as noted above.  No acute abnormality seen. Electronically Signed   By: Rosalene Colon M.D.   On: 07/15/2023 17:27    Procedures Procedures    Medications Ordered in ED Medications  ipratropium-albuterol  (DUONEB) 0.5-2.5 (3) MG/3ML nebulizer solution 3 mL (3 mLs Nebulization Given 07/15/23 1729)  methylPREDNISolone  sodium succinate (SOLU-MEDROL ) 125 mg/2 mL injection 125 mg (125 mg Intravenous Given 07/15/23 1727)  diphenhydrAMINE  (BENADRYL ) injection 25 mg (25 mg Intravenous Given 07/15/23 1814)  famotidine  (PEPCID ) IVPB 20 mg premix (0 mg Intravenous Stopped 07/15/23 2006)  bacitracin  ointment ( Topical Given 07/15/23 2231)    ED Course/ Medical Decision Making/ A&P Clinical Course as of 07/15/23 2345  Fri Jul 15, 2023  2041 CO2: 24 [AO]    Clinical Course User Index [AO] Lorain Robson, MD                                 Medical Decision Making Amount and/or Complexity of Data Reviewed Labs: ordered. Decision-making details documented in ED Course. Radiology: ordered.  Risk OTC drugs. Prescription drug management.   Patient is alert, afebrile, hemodynamically stable.  She is mildly tachypneic on exam with end expiratory wheezing noted.  Differential includes COPD exacerbation, asthma exacerbation, CHF exacerbation, pneumonia, ACS, amongst other diagnoses.  I considered PE given recent travel, feel that this is less likely given that light duration is 2 hours, in addition to wheezing on  exam.  Given fall, will obtain x-ray imaging of the right shoulder, and right tib-fib.  Patient was given 125 mg Solu-Medrol , DuoNebs.  Shortly after receiving Solu-Medrol , patient was noted to have erythema over the chest with mild itching.  She had no worsening shortness of breath, no changes in wheezing, no vital sign changes.  Does not appear to be anaphylaxis, and patient has had steroids in the past with no difficulty.  Unsure what triggered her itchiness, but she was given Benadryl  and  Pepcid  with prompt resolution of her symptoms.  I personally interpreted patient's chest x-ray, which demonstrated no focal consolidations concerning for pneumonia, no overt pulmonary edema.  Right shoulder x-ray with no acute traumatic findings.  Right tib-fib x-ray with no acute findings.  CBC resulted with mild leukocytosis of 13.2, normal hemoglobin, CMP with BUN 17, creatinine 1.34 (renal function at baseline), no electrolyte derangements, no transaminitis.  First troponin 33.  BNP 52.  Unremarkable VBG.  While pending workup, patient also noted back pain.  She has paraspinal pain to palpation, no central pain, no overlying skin changes.  Pelvis is stable to lateral compression.  X-ray imaging of T and L-spine with no acute fractures, stable compression deformities noted.  Pelvic x-ray with no acute fractures or dislocations.  Second troponin resulted at 36.  Patient ambulated in the ED without difficulty, pulse ox remained stable as well, but did endorse soreness to her right ankle.  Ankle brace was placed.  I performed washout of the wound to her right shin and applied CHG dressing.  We discussed following up outpatient for reassessment of the wound, with daily dressing changes and bacitracin  ointment application.  Patient and daughter at bedside are agreeable with this plan.  In terms of patient's symptoms, suspect asthma/COPD exacerbation.  We discussed taking steroids outpatient and following up with PCP over the next few days for reassessment of her shortness of breath as well as right ankle and right lower extremity wound.  They are agreeable to this plan.  Strict return precautions were given, patient was discharged in stable condition.  Patient seen in conjunction with Dr. Delana Favors, who agreed with the above work-up and plan of care.        Final Clinical Impression(s) / ED Diagnoses Final diagnoses:  Fall, initial encounter  Shortness of breath    Rx / DC Orders ED Discharge  Orders          Ordered    predniSONE  (STERAPRED UNI-PAK 21 TAB) 10 MG (21) TBPK tablet  Daily        07/15/23 2220              Lorain Robson, MD 07/15/23 2345    Dalene Duck, MD 07/19/23 [phone]

## 2023-07-15 NOTE — Progress Notes (Signed)
 Orthopedic Tech Progress Note Patient Details:  Glenda Terry 1940/04/28 [id number]  Ortho Devices Type of Ortho Device: ASO Ortho Device/Splint Location: RLE Ortho Device/Splint Interventions: Ordered, Application, Adjustment   Post Interventions Patient Tolerated: Well  [name] 07/15/2023, 9:05 PM

## 2023-07-18 DIAGNOSIS — S81811D Laceration without foreign body, right lower leg, subsequent encounter: Secondary | ICD-10-CM | POA: Diagnosis not present

## 2023-07-18 DIAGNOSIS — S81811A Laceration without foreign body, right lower leg, initial encounter: Secondary | ICD-10-CM | POA: Diagnosis not present

## 2023-07-18 DIAGNOSIS — M542 Cervicalgia: Secondary | ICD-10-CM | POA: Diagnosis not present

## 2023-07-18 DIAGNOSIS — W010XXD Fall on same level from slipping, tripping and stumbling without subsequent striking against object, subsequent encounter: Secondary | ICD-10-CM | POA: Diagnosis not present

## 2023-07-18 DIAGNOSIS — R110 Nausea: Secondary | ICD-10-CM | POA: Diagnosis not present

## 2023-07-20 ENCOUNTER — Telehealth: Payer: Self-pay | Admitting: Internal Medicine

## 2023-07-20 ENCOUNTER — Encounter: Payer: Self-pay | Admitting: Internal Medicine

## 2023-07-20 NOTE — Telephone Encounter (Signed)
 Spoke with patients daughter Arlie Benedict, [place] on file) states her mother is doing much better and her breathing has improved greatly. Already had follow up appointment with PCP since event, no changes. Answered questions, no further needs at this time. Appointment to follow up with Lawana Pray, NP on 10/20/23

## 2023-07-20 NOTE — Telephone Encounter (Signed)
 Daughter Arlie Benedict) stated patient had just got off a flight on Friday and fell.  Patient was taken to ED and they reported her troponin level was elevated.  Daughter wants a call back to discuss next steps.

## 2023-07-20 NOTE — Telephone Encounter (Signed)
 I have called and spoke with the patient's daughter.  The troponin was borderline elevated in the emergency room, that was in the setting of a fall.  Patient also has severe asthma at baseline.  She is chronically short of breath.  Flat troponin does not have a ACS pattern and may be explained by the fall.  I asked the daughter to check on the patient to see if she is having any anginal symptoms such as worsening chest pain or worsening shortness of breath.  If she does have those symptoms, patient has been instructed to contact us  to be worked in the office so we can see her sooner.  If she does not have any cardiac symptoms, I think would be reasonable to continue monitoring.

## 2023-07-20 NOTE — Telephone Encounter (Signed)
 You last saw pt, based on ED report would you like sooner followup

## 2023-07-21 ENCOUNTER — Other Ambulatory Visit: Payer: Self-pay | Admitting: Internal Medicine

## 2023-07-27 DIAGNOSIS — J302 Other seasonal allergic rhinitis: Secondary | ICD-10-CM | POA: Diagnosis not present

## 2023-07-27 DIAGNOSIS — I5032 Chronic diastolic (congestive) heart failure: Secondary | ICD-10-CM | POA: Diagnosis not present

## 2023-07-27 DIAGNOSIS — J449 Chronic obstructive pulmonary disease, unspecified: Secondary | ICD-10-CM | POA: Diagnosis not present

## 2023-07-27 DIAGNOSIS — M545 Low back pain, unspecified: Secondary | ICD-10-CM | POA: Diagnosis not present

## 2023-07-27 DIAGNOSIS — E782 Mixed hyperlipidemia: Secondary | ICD-10-CM | POA: Diagnosis not present

## 2023-07-27 DIAGNOSIS — K5909 Other constipation: Secondary | ICD-10-CM | POA: Diagnosis not present

## 2023-07-27 DIAGNOSIS — F411 Generalized anxiety disorder: Secondary | ICD-10-CM | POA: Diagnosis not present

## 2023-07-27 DIAGNOSIS — J4541 Moderate persistent asthma with (acute) exacerbation: Secondary | ICD-10-CM | POA: Diagnosis not present

## 2023-07-27 DIAGNOSIS — I10 Essential (primary) hypertension: Secondary | ICD-10-CM | POA: Diagnosis not present

## 2023-07-27 DIAGNOSIS — D509 Iron deficiency anemia, unspecified: Secondary | ICD-10-CM | POA: Diagnosis not present

## 2023-07-27 DIAGNOSIS — N1832 Chronic kidney disease, stage 3b: Secondary | ICD-10-CM | POA: Diagnosis not present

## 2023-07-27 DIAGNOSIS — E1122 Type 2 diabetes mellitus with diabetic chronic kidney disease: Secondary | ICD-10-CM | POA: Diagnosis not present

## 2023-07-28 DIAGNOSIS — N2581 Secondary hyperparathyroidism of renal origin: Secondary | ICD-10-CM | POA: Diagnosis not present

## 2023-07-28 DIAGNOSIS — I5032 Chronic diastolic (congestive) heart failure: Secondary | ICD-10-CM | POA: Diagnosis not present

## 2023-07-28 DIAGNOSIS — I129 Hypertensive chronic kidney disease with stage 1 through stage 4 chronic kidney disease, or unspecified chronic kidney disease: Secondary | ICD-10-CM | POA: Diagnosis not present

## 2023-07-28 DIAGNOSIS — S81811D Laceration without foreign body, right lower leg, subsequent encounter: Secondary | ICD-10-CM | POA: Diagnosis not present

## 2023-07-28 DIAGNOSIS — N1832 Chronic kidney disease, stage 3b: Secondary | ICD-10-CM | POA: Diagnosis not present

## 2023-08-02 ENCOUNTER — Ambulatory Visit: Admitting: Gastroenterology

## 2023-08-02 ENCOUNTER — Encounter: Payer: Self-pay | Admitting: Gastroenterology

## 2023-08-02 VITALS — BP 120/73 | HR 86 | Temp 98.4°F | Ht 60.0 in | Wt 190.4 lb

## 2023-08-02 DIAGNOSIS — K5900 Constipation, unspecified: Secondary | ICD-10-CM

## 2023-08-02 DIAGNOSIS — K219 Gastro-esophageal reflux disease without esophagitis: Secondary | ICD-10-CM

## 2023-08-02 DIAGNOSIS — K5909 Other constipation: Secondary | ICD-10-CM | POA: Diagnosis not present

## 2023-08-02 DIAGNOSIS — Z860101 Personal history of adenomatous and serrated colon polyps: Secondary | ICD-10-CM

## 2023-08-02 DIAGNOSIS — L853 Xerosis cutis: Secondary | ICD-10-CM | POA: Diagnosis not present

## 2023-08-02 DIAGNOSIS — S81801D Unspecified open wound, right lower leg, subsequent encounter: Secondary | ICD-10-CM | POA: Diagnosis not present

## 2023-08-02 DIAGNOSIS — L309 Dermatitis, unspecified: Secondary | ICD-10-CM | POA: Diagnosis not present

## 2023-08-02 MED ORDER — PANTOPRAZOLE SODIUM 40 MG PO TBEC: 40.0000 mg | DELAYED_RELEASE_TABLET | Freq: Two times a day (BID) | ORAL | 3 refills | Status: AC

## 2023-08-02 NOTE — Progress Notes (Signed)
 Gastroenterology Office Note     Primary Care Physician:  Omie Bickers, MD  Primary Gastroenterologist: Dr. Margarette Shawl   Chief Complaint   Chief Complaint  Patient presents with   Follow-up    Follow up on constipation which is better     History of Present Illness   Glenda Terry is a delightful 83 y.o. female presenting today with a history of  GERD and constipation for routine follow-up, last seen in Aug 2024. She is here with her caretaker.   She has also brought a baby doll with her named Abe Abed, which was given to her by her daughter on her 82th birthday. Patient is fully oriented X 4 but enjoys having the babydoll as company when she is lonely.   Miralax  every other day. BM about daily to every other day. No straining. Sometimes feels like has to go but doesn't want to come. Sits in warm water  and will have spontaneous bowel movement easily. No rectal pain or bleeding.   No N/V. GERD controlled for most part on once daily PPI but will occasionally take BID. No dysphagia. She would like a prescription for BID so she doesn't run out, but she will be taking once daily to start and only second dosing if needed.   Nov 2023 EGD/Dilation: Normal esophagus. Dilated.                           - Large hiatal hernia.                           - Normal duodenal bulb and second portion of the                            duodenum.                           - No specimens collected. Acid suppression regimen                            recently changed to Protonix  twice daily   Colooscopy 2019: adenomas, diverticula. No surveillance due to age.   Past Medical History:  Diagnosis Date   Anxiety    Asthma    Back pain, chronic    CKD (chronic kidney disease) stage 3, GFR 30-59 ml/min (HCC)    Compression fx, thoracic spine (HCC)    T - 11   COPD (chronic obstructive pulmonary disease) (HCC) 12/16/2015   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HCC)    Diverticulitis    Essential hypertension     GERD (gastroesophageal reflux disease)    HOH (hard of hearing)    Inflammatory arthritis    Iron  deficiency anemia 01/2012   Multilevel degenerative disc disease    Nausea with vomiting 01/14/2020   Pulmonary eosinophilia (HCC)    Spinal stenosis    bilateral, lumbar   Tubular adenoma 11/2012   Wears dentures    full upper and lower   Wears hearing aid in both ears     Past Surgical History:  Procedure Laterality Date   ABDOMINAL HYSTERECTOMY     BACK SURGERY     BROW LIFT Bilateral 01/20/2023   Procedure: BLEPHAROPLASTY UPPER EYELID; W/EXCESS SKIN BLEPHAROPTOSIS REPAIR; RESECT EX BILATERAL;  Surgeon: Zacarias Hermann, MD;  Location: [facility];  Service: Ophthalmology;  Laterality: Bilateral;   CARPAL TUNNEL RELEASE Right 06/2012   CATARACT EXTRACTION W/PHACO Left 11/19/2014   Procedure: CATARACT EXTRACTION PHACO AND INTRAOCULAR LENS PLACEMENT (IOC);  Surgeon: Clay Cummins, MD;  Location: [facility];  Service: Ophthalmology;  Laterality: Left;  CDE: 4.77   CATARACT EXTRACTION W/PHACO Right 02/03/2015   Procedure: CATARACT EXTRACTION PHACO AND INTRAOCULAR LENS PLACEMENT (IOC);  Surgeon: Clay Cummins, MD;  Location: [facility];  Service: Ophthalmology;  Laterality: Right;  CDE: 4.54   COLONOSCOPY  06/19/2008   RMR: tortuous and elongated colon with scattered left-sided diverticula/colonic mucosa appeared entirely normal. Prior colonic ulcers had resolved.   COLONOSCOPY  12/2007   Dr. Riley Cheadle: Scattered diffuse sigmoid diverticula, 2 areas of ulceration at the hepatic flexure. Biopsies unremarkable.   COLONOSCOPY N/A 11/20/2012   next TCS 11/2017   COLONOSCOPY WITH PROPOFOL  N/A 12/22/2017   Procedure: COLONOSCOPY WITH PROPOFOL ;  Surgeon: Suzette Espy, MD; diverticula in the sigmoid and descending colon and 2 tubular adenomas.  No recommendations to repeat.     DECOMPRESSIVE LUMBAR LAMINECTOMY LEVEL 2  04/20/2012   Procedure: DECOMPRESSIVE LUMBAR LAMINECTOMY LEVEL 2;  Surgeon:  Florencia Hunter, MD;  Location: [facility];  Service: Orthopedics;  Laterality: Right;  Decompressive Lumbar Laminectomy of the L4 - L5 and L5 - S1 Complete/Laminectomy L5 on the Right (X-Ray)   ESOPHAGOGASTRODUODENOSCOPY  12/2007   Dr. Riley Cheadle: Possible cervical esophageal whip, noncritical Schatzki ring status post dilation. Small hiatal hernia. Slightly pale duodenal mucosa (biopsy unremarkable)   ESOPHAGOGASTRODUODENOSCOPY (EGD) WITH PROPOFOL  N/A 01/20/2017   Dr. Riley Cheadle: Medium sized hiatal hernia empiric esophageal dilation for history of dysphagia   ESOPHAGOGASTRODUODENOSCOPY (EGD) WITH PROPOFOL  N/A 04/08/2020   Surgeon: Vinetta Greening, DO;  medium size hiatal hernia 3 to 4 cm chicken bone lodged in the gastric antrum just proximal to the pylorus removed with a snare with mild bleeding at the site of lodged bone.  Normal examined duodenum.   ESOPHAGOGASTRODUODENOSCOPY (EGD) WITH PROPOFOL  N/A 02/10/2022   Procedure: ESOPHAGOGASTRODUODENOSCOPY (EGD) WITH PROPOFOL ;  Surgeon: Suzette Espy, MD;  [facility];  Service: Endoscopy;  Laterality: N/A;  8:30 AM   EYE SURGERY     BIL CATARACTS   FOREIGN BODY REMOVAL N/A 04/08/2020   Procedure: FOREIGN BODY REMOVAL;  Surgeon: Vinetta Greening, DO;  [facility]: [facility];  Service: Endoscopy;  Laterality: N/A;   INCISIONAL HERNIA REPAIR N/A 11/12/2020   Procedure: HERNIA REPAIR INCISIONAL;  Surgeon: Alanda Allegra, MD;  Location: [facility];  Service: General;  Laterality: N/A;   IR KYPHO THORACIC WITH BONE BIOPSY  02/08/2017   IR RADIOLOGIST EVAL & MGMT  02/02/2017   JOINT REPLACEMENT     KNEE ARTHROSCOPY Right    MALONEY DILATION N/A 01/20/2017   Procedure: MALONEY DILATION;  Surgeon: Suzette Espy, MD;  [facility]: [facility];  Service: Endoscopy;  Laterality: N/A;   MALONEY DILATION N/A 02/10/2022   Procedure: Londa Rival DILATION;  Surgeon: Suzette Espy, MD;  [facility];  Service: Endoscopy;  Laterality: N/A;   POLYPECTOMY   12/22/2017   Procedure: POLYPECTOMY;  Surgeon: Suzette Espy, MD;  [facility];  Service: Endoscopy;;   REVERSE SHOULDER ARTHROPLASTY Right 05/24/2014   Procedure: RIGHT SHOULDER REVERSE ARTHROPLASTY;  Surgeon: Winston Hawking, MD;  [facility];  Service: Orthopedics;  Laterality: Right;   REVERSE SHOULDER ARTHROPLASTY Left 06/10/2017   Procedure: LEFT REVERSE SHOULDER ARTHROPLASTY;  Surgeon:  Winston Hawking, MD;  Location: [facility];  Service: Orthopedics;  Laterality: Left;   TOTAL KNEE ARTHROPLASTY  01/24/2012   Procedure: TOTAL KNEE ARTHROPLASTY;  Surgeon: Aurther Blue, MD;  Location: [facility];  Service: Orthopedics;  Laterality: Right;   TOTAL KNEE ARTHROPLASTY Left 10/15/2019   Procedure: TOTAL KNEE ARTHROPLASTY;  Surgeon: Liliane Rei, MD;  Location: [facility];  Service: Orthopedics;  Laterality: Left;    TUBAL LIGATION      Current Outpatient Medications  Medication Sig Dispense Refill   albuterol  (PROAIR  HFA) 108 (90 Base) MCG/ACT inhaler Inhale 2 puffs into the lungs every 6 (six) hours as needed for wheezing or shortness of breath (For COPD).      ALPRAZolam  (XANAX ) 0.5 MG tablet Take 0.5 tablets (0.25 mg total) by mouth 2 (two) times daily as needed for anxiety. 10 tablet 0   amLODipine  (NORVASC ) 5 MG tablet TAKE ONE TABLET BY MOUTH ONCE DAILY. 90 tablet 3   Benralizumab  30 MG/ML SOSY Inject 30 mg into the skin See admin instructions. Inject 30 mg into the skin every 56 Days     calcitRIOL (ROCALTROL) 0.25 MCG capsule Take 0.25 mcg by mouth 2 (two) times a week.     calcium  carbonate (OS-CAL) 600 MG TABS tablet Take 600 mg by mouth 2 (two) times daily with a meal.     carvedilol  (COREG ) 6.25 MG tablet Take 1 tablet (6.25 mg total) by mouth 2 (two) times daily with a meal. 180 tablet 1   Cholecalciferol (VITAMIN D3) 25 MCG (1000 UT) CAPS Take by mouth 2 (two) times a week.     clobetasol ointment (TEMOVATE) 0.05 % Apply 1 Application topically at bedtime.      cyanocobalamin  2000 MCG tablet Take 2,000 mcg by mouth daily.     denosumab  (PROLIA ) 60 MG/ML SOSY injection Inject 60 mg into the skin every 6 (six) months.     diphenhydrAMINE  (BENADRYL ) 25 mg capsule Take 25 mg by mouth at bedtime.     doxycycline  (VIBRA -TABS) 100 MG tablet Take 100 mg by mouth 2 (two) times daily.     DULERA  200-5 MCG/ACT AERO Inhale 2 puffs into the lungs 2 (two) times daily. 8.8 g 0   ezetimibe (ZETIA) 10 MG tablet Take 10 mg by mouth daily.     ferrous sulfate  325 (65 FE) MG tablet Take 1 tablet (325 mg total) by mouth daily with breakfast. 30 tablet 0   fexofenadine (ALLEGRA) 180 MG tablet Take 180 mg by mouth daily.     fluticasone  (FLONASE ) 50 MCG/ACT nasal spray Place 1 spray into both nostrils daily as needed for allergies or rhinitis.      ipratropium-albuterol  (DUONEB) 0.5-2.5 (3) MG/3ML SOLN Take 3 mLs by nebulization every 4 (four) hours as needed (shortness of breath).     ketotifen  (ZADITOR ) 0.025 % ophthalmic solution Place 1 drop into both eyes 2 (two) times daily as needed (allergies). 5 mL 0   montelukast  (SINGULAIR ) 10 MG tablet Take 1 tablet (10 mg total) by mouth at bedtime. 90 tablet 2   Oxycodone  HCl 10 MG TABS Take 10 mg by mouth every 6 (six) hours as needed (pain).     pantoprazole  (PROTONIX ) 40 MG tablet Take 1 tablet (40 mg total) by mouth daily. 90 tablet 3   Polyethyl Glycol-Propyl Glycol (SYSTANE OP) Place 1 drop into both eyes 3 (three) times daily as needed (dry/irritated eyes.).     polyethylene glycol (MIRALAX  / GLYCOLAX ) 17 g packet Take  17 g by mouth daily. 14 each 0   potassium chloride  (KLOR-CON ) 10 MEQ tablet Take 40 meq(4 tab) on Monday, Wednesday and Friday, all other days take 20 meq(2 tab) by mouth daily 240 tablet 3   pravastatin  (PRAVACHOL ) 10 MG tablet Take 10 mg by mouth daily.     predniSONE  (STERAPRED UNI-PAK 21 TAB) 10 MG (21) TBPK tablet Take by mouth daily. Take 6 tabs by mouth daily  for 2 days, then 5 tabs for 2 days, then  4 tabs for 2 days, then 3 tabs for 2 days, 2 tabs for 2 days, then 1 tab by mouth daily for 2 days 42 tablet 0   torsemide  (DEMADEX ) 20 MG tablet TAKE 2 TABLETS BY MOUTH ON MONDAY, WEDNESDAY, AND FRIDAY. ALL OTHER DAYS TAKE 1 TABLET DAILY. 120 tablet 1   traMADol  (ULTRAM ) 50 MG tablet Take 1 every 4-6 hours as needed for pain not controlled by Tylenol  6 tablet 0   vitamin C (ASCORBIC ACID ) 500 MG tablet Take 500 mg by mouth daily.     acetaminophen  (TYLENOL ) 500 MG tablet Take 1,000 mg by mouth every 6 (six) hours as needed for moderate pain. (Patient not taking: Reported on 05/12/2023)     mirabegron  ER (MYRBETRIQ ) 25 MG TB24 tablet Take 1 tablet (25 mg total) by mouth daily. (Patient not taking: Reported on 02/28/2023) 30 tablet 11   No current facility-administered medications for this visit.    Allergies as of 08/02/2023 - Review Complete 08/02/2023  Allergen Reaction Noted   Flexeril [cyclobenzaprine] Anaphylaxis 01/12/2012   Other Anaphylaxis and Other (See Comments) 02/08/2011   Keflex  [cephalexin ] Itching 05/26/2012   Aspirin Other (See Comments) 05/05/2015   Erythromycin  Itching 02/28/2023   Fentanyl  Swelling 11/09/2020   Adhesive [tape] Rash 11/01/2014   Chlorhexidine  Rash 11/19/2014    Family History  Problem Relation Age of Onset   Breast cancer Sister    Colon cancer Sister        two deceased, one living and terminal, one current undergoing treatment, ages 52, 36, 30, 73   Hypertension Mother    Stroke Mother    Heart attack Mother    Hypertension Father    Prostate cancer Father     Social History   Socioeconomic History   Marital status: Single    Spouse name: Not on file   Number of children: 3   Years of education: Not on file   Highest education level: Not on file  Occupational History   Not on file  Tobacco Use   Smoking status: Former    Current packs/day: 0.00    Average packs/day: 2.0 packs/day for 40.0 years (80.0 ttl pk-yrs)    Types: Cigarettes     Start date: 01/19/1952    Quit date: 01/19/1992    Years since quitting: 31.5   Smokeless tobacco: Never  Vaping Use   Vaping status: Never Used  Substance and Sexual Activity   Alcohol  use: No   Drug use: No   Sexual activity: Not Currently  Other Topics Concern   Not on file  Social History Narrative   Not on file   Social Drivers of Health   Financial Resource Strain: Not on file  Food Insecurity: No Food Insecurity (05/27/2023)   [facility]   Hunger Vital Sign    Worried About Running Out of Food in the Last Year: Never true    Ran Out of Food in the Last Year: Never true  Transportation Needs: No Transportation Needs (05/27/2023)   Received from [facility] - Transportation    Lack of Transportation (Medical): No    Lack of Transportation (Non-Medical): No  Physical Activity: Not on file  Stress: Not on file  Social Connections: Not on file  Intimate Partner Violence: Not At Risk (05/27/2023)   Received from [facility]   Humiliation, Afraid, Rape, and Kick questionnaire    Fear of Current or Ex-Partner: No    Emotionally Abused: No    Physically Abused: No    Sexually Abused: No     Review of Systems   Gen: Denies any fever, chills, fatigue, weight loss, lack of appetite.  CV: Denies chest pain, heart palpitations, peripheral edema, syncope.  Resp: Denies shortness of breath at rest or with exertion. Denies wheezing or cough.  GI: Denies dysphagia or odynophagia. Denies jaundice, hematemesis, fecal incontinence. GU : Denies urinary burning, urinary frequency, urinary hesitancy MS: Denies joint pain, muscle weakness, cramps, or limitation of movement.  Derm: Denies rash, itching, dry skin Psych: Denies depression, anxiety, memory loss, and confusion Heme: Denies bruising, bleeding, and enlarged lymph nodes.   Physical Exam   BP 120/73   Pulse 86   Temp 98.4 F (36.9 C)   Ht 5' (1.524 m)   Wt 190 lb 6.4 oz (86.4 kg)    BMI 37.18 kg/m  General:   Alert and oriented. Pleasant and cooperative. Well-nourished and well-developed.  Head:  Normocephalic and atraumatic. Eyes:  Without icterus Abdomen:  +BS, soft, non-tender and non-distended. No HSM noted. No guarding or rebound. No masses appreciated.  Rectal:  Deferred  Msk:  Symmetrical without gross deformities. Normal posture. Extremities:  Without edema. Neurologic:  Alert and  oriented x4;  grossly normal neurologically. Skin:  Intact without significant lesions or rashes. Psych:  Alert and cooperative. Normal mood and affect.   Assessment   ROMANITA FAGER is a delightful 83 y.o. female presenting today with a history of  GERD and constipation for routine follow-up, last seen in Aug 2024. She is here with her caretaker.   GERD: doing well on pantoprazole  once daily and occasional BID. Will ensure prescription is BID in case she needs this. Overall, continue the lowest dosing that treats symptoms.  Constipation: miralax  every other day doing well for her; she has been titrating this as needed. No alarm sign/symptoms.     Colonoscopy 2019: adenomas, diverticula. No surveillance due to age.    PLAN    Pantoprazole  daily, may take BID if neeed. Rx provided with refills Continue Miralax  every other day. Could consider novel dosing of 1/2 capful daily if this is helpful As doing well, return in 1 year or sooner if needed.    Delman Ferns, PhD, ANP-BC [facility]

## 2023-08-02 NOTE — Patient Instructions (Signed)
 So glad to see you again, and thank you for bringing sweet baby with you!  Have a wonderful birthday!  Continue pantoprazole  once to twice a day. You can continue Miralax  every other day or even take 1/2 capful every day if this is more helpful.  We will see you in 1 year or sooner if needed!  I enjoyed seeing you again today! I value our relationship and want to provide genuine, compassionate, and quality care. You may receive a survey regarding your visit with me, and I welcome your feedback! Thanks so much for taking the time to complete this. I look forward to seeing you again.      Delman Ferns, PhD, ANP-BC [facility]

## 2023-08-05 DIAGNOSIS — J4550 Severe persistent asthma, uncomplicated: Secondary | ICD-10-CM | POA: Diagnosis not present

## 2023-08-05 DIAGNOSIS — D7219 Other eosinophilia: Secondary | ICD-10-CM | POA: Diagnosis not present

## 2023-08-09 DIAGNOSIS — S81801D Unspecified open wound, right lower leg, subsequent encounter: Secondary | ICD-10-CM | POA: Diagnosis not present

## 2023-08-09 DIAGNOSIS — R2681 Unsteadiness on feet: Secondary | ICD-10-CM | POA: Diagnosis not present

## 2023-08-09 DIAGNOSIS — K5909 Other constipation: Secondary | ICD-10-CM | POA: Diagnosis not present

## 2023-08-09 DIAGNOSIS — K219 Gastro-esophageal reflux disease without esophagitis: Secondary | ICD-10-CM | POA: Diagnosis not present

## 2023-08-13 DIAGNOSIS — I10 Essential (primary) hypertension: Secondary | ICD-10-CM | POA: Diagnosis not present

## 2023-08-13 DIAGNOSIS — I5032 Chronic diastolic (congestive) heart failure: Secondary | ICD-10-CM | POA: Diagnosis not present

## 2023-08-13 DIAGNOSIS — E782 Mixed hyperlipidemia: Secondary | ICD-10-CM | POA: Diagnosis not present

## 2023-08-13 DIAGNOSIS — E1122 Type 2 diabetes mellitus with diabetic chronic kidney disease: Secondary | ICD-10-CM | POA: Diagnosis not present

## 2023-08-15 DIAGNOSIS — M25471 Effusion, right ankle: Secondary | ICD-10-CM | POA: Diagnosis not present

## 2023-08-17 DIAGNOSIS — S81801D Unspecified open wound, right lower leg, subsequent encounter: Secondary | ICD-10-CM | POA: Diagnosis not present

## 2023-08-17 DIAGNOSIS — M25471 Effusion, right ankle: Secondary | ICD-10-CM | POA: Diagnosis not present

## 2023-08-17 DIAGNOSIS — H0289 Other specified disorders of eyelid: Secondary | ICD-10-CM | POA: Diagnosis not present

## 2023-08-19 ENCOUNTER — Encounter: Admitting: Vascular Surgery

## 2023-08-24 NOTE — Progress Notes (Signed)
 Office Note     CC:  LE pain with walking  Requesting Provider:  Wendi Ham, NP  HPI: Glenda Terry is a 83 y.o. ([birth year]) female presenting at the request of .Omie Bickers, MD with lower extremity pain with ambulation.  On exam, Glenda Terry was doing well, accompanied by her daughter.  A native of [place] [facility], she has spent the majority of her life in [place].  She currently resides in [place].  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  She retired at the age of 89, and stated that since retirement, her body has continued to breakdown.  Glenda Terry notes intermittent lower extremity heaviness over the last several months.  This involves both legs, but usually the heaviness is unilateral in one leg or the other when it occurs.  She notices it most when she is getting up and getting moving.  She does not appreciate worsening of symptoms by days end , nor does she appreciate significant edema by days end.  She walks through the heaviness, and the sensation resolves.  She denies history of claudication, ischemic rest pain, tissue loss.  She denies significant lower extremity edema.  She does wear compression stockings, and states that this helps with the leg heaviness.  No prior history of DVT.  Patient suffered a fall last month with lumbar spine imaging demonstrating no acute fracture, stable, mild compression deformities of T12, L1, L4, 5 mm of anterolisthesis of L5/S1 with slight progression.  Chronic compression fractures T10, T11, T12. Prior MRI: 1. Generalized thoracic spine degeneration with scoliosis. Degenerative foraminal impingement on the right at T9-10 to T11-12 and on the left at T11-12 to L2-3. 2. Diffusely patent spinal canal. 3. Remote T11 compression fracture Last echo 2022-ejection fraction 60 to 65%   Past Medical History:  Diagnosis Date   Anxiety    Asthma    Back pain, chronic    CKD (chronic kidney disease) stage 3, GFR 30-59  ml/min (HCC)    Compression fx, thoracic spine (HCC)    T - 11   COPD (chronic obstructive pulmonary disease) (HCC) 12/16/2015   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HCC)    Diverticulitis    Essential hypertension    GERD (gastroesophageal reflux disease)    HOH (hard of hearing)    Inflammatory arthritis    Iron  deficiency anemia 01/2012   Multilevel degenerative disc disease    Nausea with vomiting 01/14/2020   Pulmonary eosinophilia (HCC)    Spinal stenosis    bilateral, lumbar   Tubular adenoma 11/2012   Wears dentures    full upper and lower   Wears hearing aid in both ears     Past Surgical History:  Procedure Laterality Date   ABDOMINAL HYSTERECTOMY     BACK SURGERY     BROW LIFT Bilateral 01/20/2023   Procedure: BLEPHAROPLASTY UPPER EYELID; W/EXCESS SKIN BLEPHAROPTOSIS REPAIR; RESECT EX BILATERAL;  Surgeon: Zacarias Hermann, MD;  Location: [facility];  Service: Ophthalmology;  Laterality: Bilateral;   CARPAL TUNNEL RELEASE Right 06/2012   CATARACT EXTRACTION W/PHACO Left 11/19/2014   Procedure: CATARACT EXTRACTION PHACO AND INTRAOCULAR LENS PLACEMENT (IOC);  Surgeon: Clay Cummins, MD;  Location: [facility];  Service: Ophthalmology;  Laterality: Left;  CDE: 4.77   CATARACT EXTRACTION W/PHACO Right 02/03/2015   Procedure: CATARACT EXTRACTION PHACO AND INTRAOCULAR LENS PLACEMENT (IOC);  Surgeon: Clay Cummins, MD;  Location: [facility];  Service: Ophthalmology;  Laterality: Right;  CDE: 4.54   COLONOSCOPY  06/19/2008   RMR: tortuous and elongated colon with scattered left-sided diverticula/colonic mucosa appeared entirely normal. Prior colonic ulcers had resolved.   COLONOSCOPY  12/2007   Dr. Riley Cheadle: Scattered diffuse sigmoid diverticula, 2 areas of ulceration at the hepatic flexure. Biopsies unremarkable.   COLONOSCOPY N/A 11/20/2012   next TCS 11/2017   COLONOSCOPY WITH PROPOFOL  N/A 12/22/2017   Procedure: COLONOSCOPY WITH PROPOFOL ;  Surgeon: Suzette Espy, MD; diverticula in the  sigmoid and descending colon and 2 tubular adenomas.  No recommendations to repeat.     DECOMPRESSIVE LUMBAR LAMINECTOMY LEVEL 2  04/20/2012   Procedure: DECOMPRESSIVE LUMBAR LAMINECTOMY LEVEL 2;  Surgeon: Florencia Hunter, MD;  Location: [facility];  Service: Orthopedics;  Laterality: Right;  Decompressive Lumbar Laminectomy of the L4 - L5 and L5 - S1 Complete/Laminectomy L5 on the Right (X-Ray)   ESOPHAGOGASTRODUODENOSCOPY  12/2007   Dr. Riley Cheadle: Possible cervical esophageal whip, noncritical Schatzki ring status post dilation. Small hiatal hernia. Slightly pale duodenal mucosa (biopsy unremarkable)   ESOPHAGOGASTRODUODENOSCOPY (EGD) WITH PROPOFOL  N/A 01/20/2017   Dr. Riley Cheadle: Medium sized hiatal hernia empiric esophageal dilation for history of dysphagia   ESOPHAGOGASTRODUODENOSCOPY (EGD) WITH PROPOFOL  N/A 04/08/2020   Surgeon: Vinetta Greening, DO;  medium size hiatal hernia 3 to 4 cm chicken bone lodged in the gastric antrum just proximal to the pylorus removed with a snare with mild bleeding at the site of lodged bone.  Normal examined duodenum.   ESOPHAGOGASTRODUODENOSCOPY (EGD) WITH PROPOFOL  N/A 02/10/2022   Procedure: ESOPHAGOGASTRODUODENOSCOPY (EGD) WITH PROPOFOL ;  Surgeon: Suzette Espy, MD;  [facility];  Service: Endoscopy;  Laterality: N/A;  8:30 AM   EYE SURGERY     BIL CATARACTS   FOREIGN BODY REMOVAL N/A 04/08/2020   Procedure: FOREIGN BODY REMOVAL;  Surgeon: Vinetta Greening, DO;  [facility];  Service: Endoscopy;  Laterality: N/A;   INCISIONAL HERNIA REPAIR N/A 11/12/2020   Procedure: HERNIA REPAIR INCISIONAL;  Surgeon: Alanda Allegra, MD;  Location: [facility];  Service: General;  Laterality: N/A;   IR KYPHO THORACIC WITH BONE BIOPSY  02/08/2017   IR RADIOLOGIST EVAL & MGMT  02/02/2017   JOINT REPLACEMENT     KNEE ARTHROSCOPY Right    MALONEY DILATION N/A 01/20/2017   Procedure: MALONEY DILATION;  Surgeon: Suzette Espy, MD;  [facility];   Service: Endoscopy;  Laterality: N/A;   MALONEY DILATION N/A 02/10/2022   Procedure: Londa Rival DILATION;  Surgeon: Suzette Espy, MD;  [facility];  Service: Endoscopy;  Laterality: N/A;   POLYPECTOMY  12/22/2017   Procedure: POLYPECTOMY;  Surgeon: Suzette Espy, MD;  [facility];  Service: Endoscopy;;   REVERSE SHOULDER ARTHROPLASTY Right 05/24/2014   Procedure: RIGHT SHOULDER REVERSE ARTHROPLASTY;  Surgeon: Winston Hawking, MD;  Location: [facility];  Service: Orthopedics;  Laterality: Right;   REVERSE SHOULDER ARTHROPLASTY Left 06/10/2017   Procedure: LEFT REVERSE SHOULDER ARTHROPLASTY;  Surgeon: Winston Hawking, MD;  Location: [facility];  Service: Orthopedics;  Laterality: Left;   TOTAL KNEE ARTHROPLASTY  01/24/2012   Procedure: TOTAL KNEE ARTHROPLASTY;  Surgeon: Aurther Blue, MD;  Location: [facility];  Service: Orthopedics;  Laterality: Right;   TOTAL KNEE ARTHROPLASTY Left 10/15/2019   Procedure: TOTAL KNEE ARTHROPLASTY;  Surgeon: Liliane Rei, MD;  Location: [facility];  Service: Orthopedics;  Laterality: Left;    TUBAL LIGATION      Social History   Socioeconomic History  Marital status: Single    Spouse name: Not on file   Number of children: 3   Years of education: Not on file   Highest education level: Not on file  Occupational History   Not on file  Tobacco Use   Smoking status: Former    Current packs/day: 0.00    Average packs/day: 2.0 packs/day for 40.0 years (80.0 ttl pk-yrs)    Types: Cigarettes    Start date: 01/19/1952    Quit date: 01/19/1992    Years since quitting: 31.6   Smokeless tobacco: Never  Vaping Use   Vaping status: Never Used  Substance and Sexual Activity   Alcohol  use: No   Drug use: No   Sexual activity: Not Currently  Other Topics Concern   Not on file  Social History Narrative   Not on file   Social Drivers of Health   Financial Resource Strain: Not on file  Food Insecurity: No Food Insecurity (05/27/2023)   Received  [facility]   Hunger Vital Sign    Worried About Running Out of Food in the Last Year: Never true    Ran Out of Food in the Last Year: Never true  Transportation Needs: No Transportation Needs (05/27/2023)   Received from [facility]   PRAPARE - Transportation    Lack of Transportation (Medical): No    Lack of Transportation (Non-Medical): No  Physical Activity: Not on file  Stress: Not on file  Social Connections: Not on file  Intimate Partner Violence: Not At Risk (05/27/2023)   Received from [facility]   Humiliation, Afraid, Rape, and Kick questionnaire    Fear of Current or Ex-Partner: No    Emotionally Abused: No    Physically Abused: No    Sexually Abused: No   Family History  Problem Relation Age of Onset   Breast cancer Sister    Colon cancer Sister        two deceased, one living and terminal, one current undergoing treatment, ages 48, 38, 23, 69   Hypertension Mother    Stroke Mother    Heart attack Mother    Hypertension Father    Prostate cancer Father     Current Outpatient Medications  Medication Sig Dispense Refill   albuterol  (PROAIR  HFA) 108 (90 Base) MCG/ACT inhaler Inhale 2 puffs into the lungs every 6 (six) hours as needed for wheezing or shortness of breath (For COPD).      ALPRAZolam  (XANAX ) 0.5 MG tablet Take 0.5 tablets (0.25 mg total) by mouth 2 (two) times daily as needed for anxiety. 10 tablet 0   amLODipine  (NORVASC ) 5 MG tablet TAKE ONE TABLET BY MOUTH ONCE DAILY. 90 tablet 3   Benralizumab  30 MG/ML SOSY Inject 30 mg into the skin See admin instructions. Inject 30 mg into the skin every 56 Days     calcitRIOL (ROCALTROL) 0.25 MCG capsule Take 0.25 mcg by mouth 2 (two) times a week.     calcium  carbonate (OS-CAL) 600 MG TABS tablet Take 600 mg by mouth 2 (two) times daily with a meal.     carvedilol  (COREG ) 6.25 MG tablet Take 1 tablet (6.25 mg total) by mouth 2 (two) times daily with a meal. 180 tablet 1   Cholecalciferol (VITAMIN  D3) 25 MCG (1000 UT) CAPS Take by mouth 2 (two) times a week.     clobetasol ointment (TEMOVATE) 0.05 % Apply 1 Application topically at bedtime.     cyanocobalamin  2000 MCG  tablet Take 2,000 mcg by mouth daily.     denosumab  (PROLIA ) 60 MG/ML SOSY injection Inject 60 mg into the skin every 6 (six) months.     diphenhydrAMINE  (BENADRYL ) 25 mg capsule Take 25 mg by mouth at bedtime.     doxycycline  (VIBRA -TABS) 100 MG tablet Take 100 mg by mouth 2 (two) times daily.     DULERA  200-5 MCG/ACT AERO Inhale 2 puffs into the lungs 2 (two) times daily. 8.8 g 0   ezetimibe (ZETIA) 10 MG tablet Take 10 mg by mouth daily.     ferrous sulfate  325 (65 FE) MG tablet Take 1 tablet (325 mg total) by mouth daily with breakfast. 30 tablet 0   fexofenadine (ALLEGRA) 180 MG tablet Take 180 mg by mouth daily.     fluticasone  (FLONASE ) 50 MCG/ACT nasal spray Place 1 spray into both nostrils daily as needed for allergies or rhinitis.      ipratropium-albuterol  (DUONEB) 0.5-2.5 (3) MG/3ML SOLN Take 3 mLs by nebulization every 4 (four) hours as needed (shortness of breath).     ketotifen  (ZADITOR ) 0.025 % ophthalmic solution Place 1 drop into both eyes 2 (two) times daily as needed (allergies). 5 mL 0   montelukast  (SINGULAIR ) 10 MG tablet Take 1 tablet (10 mg total) by mouth at bedtime. 90 tablet 2   Oxycodone  HCl 10 MG TABS Take 10 mg by mouth every 6 (six) hours as needed (pain).     pantoprazole  (PROTONIX ) 40 MG tablet Take 1 tablet (40 mg total) by mouth 2 (two) times daily before a meal. 180 tablet 3   Polyethyl Glycol-Propyl Glycol (SYSTANE OP) Place 1 drop into both eyes 3 (three) times daily as needed (dry/irritated eyes.).     polyethylene glycol (MIRALAX  / GLYCOLAX ) 17 g packet Take 17 g by mouth daily. 14 each 0   potassium chloride  (KLOR-CON ) 10 MEQ tablet Take 40 meq(4 tab) on Monday, Wednesday and Friday, all other days take 20 meq(2 tab) by mouth daily 240 tablet 3   pravastatin  (PRAVACHOL ) 10 MG tablet  Take 10 mg by mouth daily.     predniSONE  (STERAPRED UNI-PAK 21 TAB) 10 MG (21) TBPK tablet Take by mouth daily. Take 6 tabs by mouth daily  for 2 days, then 5 tabs for 2 days, then 4 tabs for 2 days, then 3 tabs for 2 days, 2 tabs for 2 days, then 1 tab by mouth daily for 2 days 42 tablet 0   torsemide  (DEMADEX ) 20 MG tablet TAKE 2 TABLETS BY MOUTH ON MONDAY, WEDNESDAY, AND FRIDAY. ALL OTHER DAYS TAKE 1 TABLET DAILY. 120 tablet 1   traMADol  (ULTRAM ) 50 MG tablet Take 1 every 4-6 hours as needed for pain not controlled by Tylenol  6 tablet 0   vitamin C (ASCORBIC ACID ) 500 MG tablet Take 500 mg by mouth daily.     No current facility-administered medications for this visit.    Allergies  Allergen Reactions   Flexeril [Cyclobenzaprine] Anaphylaxis   Other Anaphylaxis and Other (See Comments)    ALL TREE NUTS   Keflex  [Cephalexin ] Itching   Aspirin Other (See Comments)    Cannot take due to ulcers   Erythromycin  Itching    Lips swelling and itching and coughing   Fentanyl  Swelling    Lip, facial swelling fentanyl    Adhesive [Tape] Rash   Chlorhexidine  Rash     REVIEW OF SYSTEMS:  [X]  denotes positive finding, [ ]  denotes negative finding Cardiac  Comments:  Chest pain or  chest pressure:    Shortness of breath upon exertion:    Short of breath when lying flat:    Irregular heart rhythm:        Vascular    Pain in calf, thigh, or hip brought on by ambulation:    Pain in feet at night that wakes you up from your sleep:     Blood clot in your veins:    Leg swelling:         Pulmonary    Oxygen  at home:    Productive cough:     Wheezing:         Neurologic    Sudden weakness in arms or legs:     Sudden numbness in arms or legs:     Sudden onset of difficulty speaking or slurred speech:    Temporary loss of vision in one eye:     Problems with dizziness:         Gastrointestinal    Blood in stool:     Vomited blood:         Genitourinary    Burning when urinating:      Blood in urine:        Psychiatric    Major depression:         Hematologic    Bleeding problems:    Problems with blood clotting too easily:        Skin    Rashes or ulcers:        Constitutional    Fever or chills:      PHYSICAL EXAMINATION:  There were no vitals filed for this visit.  General:  WDWN in NAD; vital signs documented above Gait: Not observed HENT: WNL, normocephalic Pulmonary: normal non-labored breathing , significant wheezing which per patient has baseline Cardiac: regular HR Abdomen: soft, NT, no masses Skin: without rashes Vascular Exam/Pulses:  Right Left  Radial 2+ (normal) 2+ (normal)  Ulnar    Femoral    Popliteal    DP 2+ (normal) 2+ (normal)  PT     Extremities: without ischemic changes, without Gangrene , without cellulitis; without open wounds;  Some swelling of the lateral ankle.  This has been evaluated by podiatry, with no planned treatment. Musculoskeletal: no muscle wasting or atrophy  Neurologic: A&O X 3;  No focal weakness or paresthesias are detected Psychiatric:  The pt has Normal affect.   Non-Invasive Vascular Imaging:     FINDINGS: Right ABI:  0.94   Left ABI:  0.97   Right Lower Extremity:  Normal arterial waveforms at the ankle.   Left Lower Extremity:  Normal arterial waveforms at the ankle.    ASSESSMENT/PLAN: DAZIYA REDMOND is a 83 y.o. female presenting with lower extremity heaviness which resolves with ambulation.  On exam, she had palpable pulses in the feet.  No significant edema in the lower extremities, however there were a small amount of varicosities, telangiectasias.  ABI was reviewed-this is normal.  I had a long conversation with Glenda Terry regarding the above.  I think that her lower extremity heaviness is likely musculoskeletal in etiology, however there could be a mild amount of venous insufficiency as well.  At her age, and with mild symptoms, I do not think that this warrants further workup  as lower extremity compression helps significantly.  We discussed that if this worsens, I would be happy to perform a lower extremity venous duplex ultrasound to confirm the diagnosis as well as talk about procedural options if  necessary.  She is not interested in any procedures at this time.  Glenda Terry can follow-up with me as needed at this time.   Kayla Part, MD [facility] [phone] Total time of patient care including pre-visit research, consultation, and documentation greater than 45 minutes

## 2023-08-25 ENCOUNTER — Ambulatory Visit: Attending: Vascular Surgery | Admitting: Vascular Surgery

## 2023-08-25 ENCOUNTER — Encounter: Payer: Self-pay | Admitting: Vascular Surgery

## 2023-08-25 ENCOUNTER — Other Ambulatory Visit: Payer: Self-pay

## 2023-08-25 VITALS — BP 140/82 | HR 81 | Temp 98.1°F | Resp 24 | Ht 60.0 in | Wt 188.8 lb

## 2023-08-25 DIAGNOSIS — R29898 Other symptoms and signs involving the musculoskeletal system: Secondary | ICD-10-CM | POA: Diagnosis not present

## 2023-08-25 DIAGNOSIS — M7989 Other specified soft tissue disorders: Secondary | ICD-10-CM

## 2023-08-25 MED ORDER — CARVEDILOL 6.25 MG PO TABS: 6.2500 mg | ORAL_TABLET | Freq: Two times a day (BID) | ORAL | 2 refills | Status: DC

## 2023-08-26 ENCOUNTER — Telehealth: Payer: Self-pay | Admitting: Internal Medicine

## 2023-08-26 DIAGNOSIS — H04123 Dry eye syndrome of bilateral lacrimal glands: Secondary | ICD-10-CM | POA: Diagnosis not present

## 2023-08-26 DIAGNOSIS — H11823 Conjunctivochalasis, bilateral: Secondary | ICD-10-CM | POA: Diagnosis not present

## 2023-08-26 DIAGNOSIS — H26492 Other secondary cataract, left eye: Secondary | ICD-10-CM | POA: Diagnosis not present

## 2023-08-26 MED ORDER — CARVEDILOL 6.25 MG PO TABS: 6.2500 mg | ORAL_TABLET | Freq: Two times a day (BID) | ORAL | 2 refills | Status: AC

## 2023-08-26 NOTE — Telephone Encounter (Signed)
*  STAT* If patient is at the pharmacy, call can be transferred to refill team.   1. Which medications need to be refilled? (please list name of each medication and dose if known)   carvedilol  (COREG ) 6.25 MG tablet    2. Would you like to learn more about the convenience, safety, & potential cost savings by using the [facility]?   3. Are you open to using the [facility] (Type [facility]. ).  4. Which pharmacy/location (including street and city if local pharmacy) is medication to be sent to?  [facility] - [place], [place] - [address]   5. Do they need a 30 day or 90 day supply?   Daughter stated patient's prescription was not received by her pharmacy and wants prescription re-sent.

## 2023-08-26 NOTE — Telephone Encounter (Signed)
 Pt's medication was resent to pt's pharmacy as requested. Confirmation received.

## 2023-09-02 DIAGNOSIS — F411 Generalized anxiety disorder: Secondary | ICD-10-CM | POA: Diagnosis not present

## 2023-09-02 DIAGNOSIS — M545 Low back pain, unspecified: Secondary | ICD-10-CM | POA: Diagnosis not present

## 2023-09-02 DIAGNOSIS — N1832 Chronic kidney disease, stage 3b: Secondary | ICD-10-CM | POA: Diagnosis not present

## 2023-09-02 DIAGNOSIS — K5909 Other constipation: Secondary | ICD-10-CM | POA: Diagnosis not present

## 2023-09-02 DIAGNOSIS — J302 Other seasonal allergic rhinitis: Secondary | ICD-10-CM | POA: Diagnosis not present

## 2023-09-02 DIAGNOSIS — D509 Iron deficiency anemia, unspecified: Secondary | ICD-10-CM | POA: Diagnosis not present

## 2023-09-02 DIAGNOSIS — E1122 Type 2 diabetes mellitus with diabetic chronic kidney disease: Secondary | ICD-10-CM | POA: Diagnosis not present

## 2023-09-02 DIAGNOSIS — I5032 Chronic diastolic (congestive) heart failure: Secondary | ICD-10-CM | POA: Diagnosis not present

## 2023-09-02 DIAGNOSIS — J449 Chronic obstructive pulmonary disease, unspecified: Secondary | ICD-10-CM | POA: Diagnosis not present

## 2023-09-02 DIAGNOSIS — E782 Mixed hyperlipidemia: Secondary | ICD-10-CM | POA: Diagnosis not present

## 2023-09-02 DIAGNOSIS — I10 Essential (primary) hypertension: Secondary | ICD-10-CM | POA: Diagnosis not present

## 2023-09-02 DIAGNOSIS — J4541 Moderate persistent asthma with (acute) exacerbation: Secondary | ICD-10-CM | POA: Diagnosis not present

## 2023-09-09 ENCOUNTER — Encounter: Payer: PPO | Attending: Internal Medicine | Admitting: Emergency Medicine

## 2023-09-09 VITALS — BP 131/73 | HR 89 | Temp 97.5°F | Resp 18

## 2023-09-09 DIAGNOSIS — M810 Age-related osteoporosis without current pathological fracture: Secondary | ICD-10-CM | POA: Diagnosis not present

## 2023-09-09 MED ORDER — DENOSUMAB 60 MG/ML ~~LOC~~ SOSY
60.0000 mg | PREFILLED_SYRINGE | Freq: Once | SUBCUTANEOUS | Status: AC
  Administered 2023-09-09: 60 mg via SUBCUTANEOUS

## 2023-09-09 NOTE — Progress Notes (Signed)
 Diagnosis: Osteoporosis  Provider:  Izetta Marshall MD  Procedure: Injection  Prolia  (Denosumab ), Dose: 60 mg, Site: subcutaneous, Number of injections: 1  Injection Site(s): Right arm  Post Care: Patient declined observation  Discharge: Condition: Good, Destination: Home . AVS Declined  Performed by:  Arlina Benjamin, RN

## 2023-09-12 DIAGNOSIS — I10 Essential (primary) hypertension: Secondary | ICD-10-CM | POA: Diagnosis not present

## 2023-09-12 DIAGNOSIS — I5032 Chronic diastolic (congestive) heart failure: Secondary | ICD-10-CM | POA: Diagnosis not present

## 2023-09-12 DIAGNOSIS — J449 Chronic obstructive pulmonary disease, unspecified: Secondary | ICD-10-CM | POA: Diagnosis not present

## 2023-09-12 DIAGNOSIS — E1122 Type 2 diabetes mellitus with diabetic chronic kidney disease: Secondary | ICD-10-CM | POA: Diagnosis not present

## 2023-09-13 DIAGNOSIS — M810 Age-related osteoporosis without current pathological fracture: Secondary | ICD-10-CM | POA: Diagnosis not present

## 2023-09-13 DIAGNOSIS — J302 Other seasonal allergic rhinitis: Secondary | ICD-10-CM | POA: Diagnosis not present

## 2023-09-13 DIAGNOSIS — R600 Localized edema: Secondary | ICD-10-CM | POA: Diagnosis not present

## 2023-09-14 ENCOUNTER — Telehealth: Payer: Self-pay

## 2023-09-14 ENCOUNTER — Telehealth: Payer: Self-pay | Admitting: Internal Medicine

## 2023-09-14 NOTE — Telephone Encounter (Signed)
 Pt called [facility] Triage line asking about swelling in her ankles, R>L  Per Dr. Lanis' note on 08/25/23, patient was advised to elevate her legs above her heart and wear her compression stockings, and avoid prolong periods of sitting or standing. which she acknowledged she is doing.

## 2023-09-14 NOTE — Telephone Encounter (Signed)
 Spoke with the patient who states that she has had some increased swelling in her right ankle. She states that she does have some swelling in left ankle but more in her right. She states that she is taking her fluid pills as prescribed. She states that her PCP decreased her amlodipine  yesterday to 1/2 tablet daily and she is seeing them back in a week. She also has seen vein and vascular specialist recently who did not think further workup was warranted unless symptoms worsened and then lower extremity venous US  could be done. Encouraged patient to reach back out to their office.  She reports that blood pressure has been 140/80s. She states that weight yesterday was 192 and today it is 190. She states that she does not eat foods high in sodium - cereal, baked chicken, greens, and fruit. Encouraged patient to use her compression stockings and elevate her legs.

## 2023-09-14 NOTE — Telephone Encounter (Signed)
 Pt c/o swelling/edema: STAT if pt has developed SOB within 24 hours  If swelling, where is the swelling located?   Ankles  How much weight have you gained and in what time span?   Yes  Have you gained 2 pounds in a day or 5 pounds in a week? Yes  Do you have a log of your daily weights (if so, list)?   Patient stated not exactly  Are you currently taking a fluid pill?   Yes  Are you currently SOB?   Patient stated No  Have you traveled recently in a car or plane for an extended period of time?   Patient stated she has been having swelling in her ankles and stated her BP yesterday was 142/81. Patient stated she thinks may be related to her calcitRIOL (ROCALTROL) 0.25 MCG capsule  medication.

## 2023-09-14 NOTE — Telephone Encounter (Signed)
 Spoke with patient's daughter Suzette (OK per [facility]) and discussed recommendations from previous call with triage nurse.  Suzette concerned about PCP reducing dose of amlodipine  as this was prescribed by cardiologist.   Patient currently taking amlodipine  2.5 mg daily, dose reduced by PCP on 09/13/23. Patient to F/U with PCP next week.  Informed Suzette Dr. Loni is out of the office this week, but will forward message to her to review. If any new recommendations we will follow-up with patient/Suzette.

## 2023-09-14 NOTE — Telephone Encounter (Signed)
 Pt daughter called in asking if someone can call her to give information and recommendations that was given to pt

## 2023-09-20 ENCOUNTER — Other Ambulatory Visit (HOSPITAL_COMMUNITY): Payer: Self-pay

## 2023-09-20 DIAGNOSIS — R600 Localized edema: Secondary | ICD-10-CM | POA: Diagnosis not present

## 2023-09-21 DIAGNOSIS — H11821 Conjunctivochalasis, right eye: Secondary | ICD-10-CM | POA: Diagnosis not present

## 2023-09-21 DIAGNOSIS — H11822 Conjunctivochalasis, left eye: Secondary | ICD-10-CM | POA: Diagnosis not present

## 2023-09-21 NOTE — Telephone Encounter (Signed)
 Not able to leave message at [phone]. Will send a [employer]. 09/21/23 at 12:30pm

## 2023-09-23 DIAGNOSIS — M545 Low back pain, unspecified: Secondary | ICD-10-CM | POA: Diagnosis not present

## 2023-09-23 DIAGNOSIS — K5909 Other constipation: Secondary | ICD-10-CM | POA: Diagnosis not present

## 2023-09-23 DIAGNOSIS — N1832 Chronic kidney disease, stage 3b: Secondary | ICD-10-CM | POA: Diagnosis not present

## 2023-09-23 DIAGNOSIS — D509 Iron deficiency anemia, unspecified: Secondary | ICD-10-CM | POA: Diagnosis not present

## 2023-09-23 DIAGNOSIS — I5032 Chronic diastolic (congestive) heart failure: Secondary | ICD-10-CM | POA: Diagnosis not present

## 2023-09-23 DIAGNOSIS — J302 Other seasonal allergic rhinitis: Secondary | ICD-10-CM | POA: Diagnosis not present

## 2023-09-23 DIAGNOSIS — J4541 Moderate persistent asthma with (acute) exacerbation: Secondary | ICD-10-CM | POA: Diagnosis not present

## 2023-09-23 DIAGNOSIS — J449 Chronic obstructive pulmonary disease, unspecified: Secondary | ICD-10-CM | POA: Diagnosis not present

## 2023-09-23 DIAGNOSIS — E1122 Type 2 diabetes mellitus with diabetic chronic kidney disease: Secondary | ICD-10-CM | POA: Diagnosis not present

## 2023-09-23 DIAGNOSIS — F411 Generalized anxiety disorder: Secondary | ICD-10-CM | POA: Diagnosis not present

## 2023-09-23 DIAGNOSIS — I10 Essential (primary) hypertension: Secondary | ICD-10-CM | POA: Diagnosis not present

## 2023-09-23 DIAGNOSIS — E782 Mixed hyperlipidemia: Secondary | ICD-10-CM | POA: Diagnosis not present

## 2023-09-26 ENCOUNTER — Ambulatory Visit (HOSPITAL_BASED_OUTPATIENT_CLINIC_OR_DEPARTMENT_OTHER)
Admission: RE | Admit: 2023-09-26 | Discharge: 2023-09-26 | Disposition: A | Discharge status: Home / Self Care | Source: Ambulatory Visit

## 2023-09-26 DIAGNOSIS — R600 Localized edema: Secondary | ICD-10-CM | POA: Diagnosis not present

## 2023-09-30 DIAGNOSIS — D7219 Other eosinophilia: Secondary | ICD-10-CM | POA: Diagnosis not present

## 2023-09-30 DIAGNOSIS — J4550 Severe persistent asthma, uncomplicated: Secondary | ICD-10-CM | POA: Diagnosis not present

## 2023-10-07 DIAGNOSIS — H04123 Dry eye syndrome of bilateral lacrimal glands: Secondary | ICD-10-CM | POA: Diagnosis not present

## 2023-10-07 DIAGNOSIS — H11823 Conjunctivochalasis, bilateral: Secondary | ICD-10-CM | POA: Diagnosis not present

## 2023-10-13 DIAGNOSIS — I5022 Chronic systolic (congestive) heart failure: Secondary | ICD-10-CM | POA: Diagnosis not present

## 2023-10-13 DIAGNOSIS — E1122 Type 2 diabetes mellitus with diabetic chronic kidney disease: Secondary | ICD-10-CM | POA: Diagnosis not present

## 2023-10-13 DIAGNOSIS — I10 Essential (primary) hypertension: Secondary | ICD-10-CM | POA: Diagnosis not present

## 2023-10-13 DIAGNOSIS — E782 Mixed hyperlipidemia: Secondary | ICD-10-CM | POA: Diagnosis not present

## 2023-10-17 DIAGNOSIS — I10 Essential (primary) hypertension: Secondary | ICD-10-CM | POA: Diagnosis not present

## 2023-10-17 DIAGNOSIS — F411 Generalized anxiety disorder: Secondary | ICD-10-CM | POA: Diagnosis not present

## 2023-10-17 DIAGNOSIS — N1832 Chronic kidney disease, stage 3b: Secondary | ICD-10-CM | POA: Diagnosis not present

## 2023-10-17 DIAGNOSIS — I5032 Chronic diastolic (congestive) heart failure: Secondary | ICD-10-CM | POA: Diagnosis not present

## 2023-10-17 DIAGNOSIS — J4541 Moderate persistent asthma with (acute) exacerbation: Secondary | ICD-10-CM | POA: Diagnosis not present

## 2023-10-17 DIAGNOSIS — E782 Mixed hyperlipidemia: Secondary | ICD-10-CM | POA: Diagnosis not present

## 2023-10-17 DIAGNOSIS — R252 Cramp and spasm: Secondary | ICD-10-CM | POA: Diagnosis not present

## 2023-10-17 DIAGNOSIS — K5909 Other constipation: Secondary | ICD-10-CM | POA: Diagnosis not present

## 2023-10-17 DIAGNOSIS — D509 Iron deficiency anemia, unspecified: Secondary | ICD-10-CM | POA: Diagnosis not present

## 2023-10-17 DIAGNOSIS — E1122 Type 2 diabetes mellitus with diabetic chronic kidney disease: Secondary | ICD-10-CM | POA: Diagnosis not present

## 2023-10-17 DIAGNOSIS — M545 Low back pain, unspecified: Secondary | ICD-10-CM | POA: Diagnosis not present

## 2023-10-17 DIAGNOSIS — E538 Deficiency of other specified B group vitamins: Secondary | ICD-10-CM | POA: Diagnosis not present

## 2023-10-17 DIAGNOSIS — J302 Other seasonal allergic rhinitis: Secondary | ICD-10-CM | POA: Diagnosis not present

## 2023-10-17 DIAGNOSIS — J449 Chronic obstructive pulmonary disease, unspecified: Secondary | ICD-10-CM | POA: Diagnosis not present

## 2023-10-20 ENCOUNTER — Ambulatory Visit: Admitting: General Practice

## 2023-10-20 ENCOUNTER — Other Ambulatory Visit (HOSPITAL_COMMUNITY): Payer: Self-pay | Admitting: Family Medicine

## 2023-10-20 DIAGNOSIS — D509 Iron deficiency anemia, unspecified: Secondary | ICD-10-CM | POA: Diagnosis not present

## 2023-10-20 DIAGNOSIS — J449 Chronic obstructive pulmonary disease, unspecified: Secondary | ICD-10-CM | POA: Diagnosis not present

## 2023-10-20 DIAGNOSIS — K219 Gastro-esophageal reflux disease without esophagitis: Secondary | ICD-10-CM | POA: Diagnosis not present

## 2023-10-20 DIAGNOSIS — N3281 Overactive bladder: Secondary | ICD-10-CM | POA: Diagnosis not present

## 2023-10-20 DIAGNOSIS — Z87311 Personal history of (healed) other pathological fracture: Secondary | ICD-10-CM | POA: Diagnosis not present

## 2023-10-20 DIAGNOSIS — I5032 Chronic diastolic (congestive) heart failure: Secondary | ICD-10-CM | POA: Diagnosis not present

## 2023-10-20 DIAGNOSIS — K449 Diaphragmatic hernia without obstruction or gangrene: Secondary | ICD-10-CM | POA: Diagnosis not present

## 2023-10-20 DIAGNOSIS — M1990 Unspecified osteoarthritis, unspecified site: Secondary | ICD-10-CM | POA: Diagnosis not present

## 2023-10-20 DIAGNOSIS — M797 Fibromyalgia: Secondary | ICD-10-CM | POA: Diagnosis not present

## 2023-10-20 DIAGNOSIS — N1832 Chronic kidney disease, stage 3b: Secondary | ICD-10-CM | POA: Diagnosis not present

## 2023-10-20 DIAGNOSIS — I130 Hypertensive heart and chronic kidney disease with heart failure and stage 1 through stage 4 chronic kidney disease, or unspecified chronic kidney disease: Secondary | ICD-10-CM | POA: Diagnosis not present

## 2023-10-20 DIAGNOSIS — R600 Localized edema: Secondary | ICD-10-CM | POA: Diagnosis not present

## 2023-10-20 DIAGNOSIS — M810 Age-related osteoporosis without current pathological fracture: Secondary | ICD-10-CM

## 2023-10-21 ENCOUNTER — Encounter: Payer: Self-pay | Admitting: Nurse Practitioner

## 2023-10-21 ENCOUNTER — Ambulatory Visit: Attending: Nurse Practitioner | Admitting: Nurse Practitioner

## 2023-10-21 VITALS — BP 138/84 | HR 93 | Ht 59.0 in | Wt 188.4 lb

## 2023-10-21 DIAGNOSIS — I10 Essential (primary) hypertension: Secondary | ICD-10-CM | POA: Diagnosis not present

## 2023-10-21 DIAGNOSIS — R600 Localized edema: Secondary | ICD-10-CM

## 2023-10-21 DIAGNOSIS — R110 Nausea: Secondary | ICD-10-CM | POA: Diagnosis not present

## 2023-10-21 DIAGNOSIS — I5032 Chronic diastolic (congestive) heart failure: Secondary | ICD-10-CM | POA: Diagnosis not present

## 2023-10-21 NOTE — Progress Notes (Signed)
 Cardiology Office Note   Date:  10/21/2023 ID:  Glenda Terry, DOB 03/15/1941, MRN [id number] PCP: Shona Norleen PEDLAR, MD  [facility] Providers Cardiologist:  Soyla DELENA Merck, MD     History of Present Illness Glenda Terry is a 83 y.o. female with a PMH of HFpEF, HTN, pulmonary eosinophilia, Cushing syndrome, inflammatory arthritis, spinal stenosis, CKD stage III, GERD, COPD, hiatal hernia, anxiety, who presents today for 28-month follow-up appointment.  Last seen by Scot Ford, PA-C on May 12, 2023.  At that time, she was concerned about lower extremity edema and discoloration along her lower extremities, was felt to be venous stasis.  She was overall doing well from a cardiac perspective.  Today she presents for 72-month follow-up appointment.  She states she is doing well.  Admits to some issues with diverticulosis and some nausea.  She is following GI.  Doing well from a cardiac perspective. Denies any chest pain, shortness of breath, palpitations, syncope, presyncope, dizziness, orthopnea, PND, swelling or significant weight changes, acute bleeding, or claudication.   ROS: Negative. See HPI.  Studies Reviewed  EKG: EKG is not ordered today.   Echo 07/2020:  1. Left ventricular ejection fraction, by estimation, is 60 to 65%. The  left ventricle has normal function. The left ventricle has no regional  wall motion abnormalities. Left ventricular diastolic parameters are  consistent with Grade I diastolic  dysfunction (impaired relaxation).   2. Right ventricular systolic function is normal. The right ventricular  size is normal.   3. The mitral valve is normal in structure. No evidence of mitral valve  regurgitation.   4. The aortic valve is tricuspid. Aortic valve regurgitation is not  visualized. No aortic stenosis is present.   5. The inferior vena cava is normal in size with greater than 50%  respiratory variability, suggesting right atrial pressure of 3 mmHg.    Comparison(s): A prior study was performed on 09/22/2015. No significant  change from prior study. Prior images reviewed side by side.   Cardiac monitor 02/2017: Available strips from 7 day event recorder reviewed. There was only one returned strip that demonstrated sinus tachycardia.  Physical Exam VS:  BP 138/84   Pulse 93   Ht 4' 11 (1.499 m)   Wt 188 lb 6.4 oz (85.5 kg)   SpO2 98%   BMI 38.05 kg/m        Wt Readings from Last 3 Encounters:  10/25/23 187 lb 9.6 oz (85.1 kg)  10/21/23 188 lb 6.4 oz (85.5 kg)  08/25/23 188 lb 12.8 oz (85.6 kg)    GEN: Well nourished, well developed in no acute distress NECK: No JVD; No carotid bruits CARDIAC: S1/S2, RRR, no murmurs, rubs, gallops RESPIRATORY:  Clear to auscultation without rales, wheezing or rhonchi  ABDOMEN: Soft, non-tender, non-distended EXTREMITIES:  No edema; No deformity   ASSESSMENT AND PLAN  HFpEF Stage C, NYHA class I-II symptoms. EF 60-65% in 2022. Euvolemic and well compensated on exam. Weights are stable.Continue current medication regimen. Low sodium diet, fluid restriction <2L, and daily weights encouraged. Educated to contact our office for weight gain of 2 lbs overnight or 5 lbs in one week.  HTN BP stable. Discussed to monitor BP at home at least 2 hours after medications and sitting for 5-10 minutes. Continue current medication regimen. Heart healthy diet and regular cardiovascular exercise encouraged.   Leg edema No leg edema noted. Continue current medication regimen. Low salt, heart healthy diet and regular cardiovascular exercise  encouraged.   Nausea Has upcoming appt with GI. Care and ED precautions discussed.       Dispo: Follow-up with MD/[facility] in 6 months or sooner if anything changes.   Signed, Almarie Crate, NP

## 2023-10-21 NOTE — Patient Instructions (Signed)
 Medication Instructions:   Your physician recommends that you continue on your current medications as directed. Please refer to the Current Medication list given to you today.   Labwork: None today  Testing/Procedures: None today  Follow-Up: 6 months with E.Peck,NP  Any Other Special Instructions Will Be Listed Below (If Applicable).  If you need a refill on your cardiac medications before your next appointment, please call your pharmacy.

## 2023-10-22 ENCOUNTER — Other Ambulatory Visit: Payer: Self-pay | Admitting: Internal Medicine

## 2023-10-25 ENCOUNTER — Ambulatory Visit: Admitting: General Practice

## 2023-10-25 ENCOUNTER — Ambulatory Visit: Admitting: Internal Medicine

## 2023-10-25 ENCOUNTER — Encounter: Payer: Self-pay | Admitting: Internal Medicine

## 2023-10-25 VITALS — BP 121/83 | HR 89 | Temp 97.8°F | Ht 59.0 in | Wt 187.6 lb

## 2023-10-25 DIAGNOSIS — K219 Gastro-esophageal reflux disease without esophagitis: Secondary | ICD-10-CM

## 2023-10-25 DIAGNOSIS — K5900 Constipation, unspecified: Secondary | ICD-10-CM | POA: Diagnosis not present

## 2023-10-25 DIAGNOSIS — R110 Nausea: Secondary | ICD-10-CM

## 2023-10-25 MED ORDER — ONDANSETRON 4 MG PO TBDP: 4.0000 mg | ORAL_TABLET | Freq: Three times a day (TID) | ORAL | 1 refills | Status: AC | PRN

## 2023-10-25 NOTE — Progress Notes (Signed)
 It was nice   Primary Care Physician:  Shona Norleen PEDLAR, MD Primary Gastroenterologist:  Dr. Shaaron  Pre-Procedure History & Physical: HPI:  Glenda Terry is a 83 y.o. female here for 2-week history of queasiness and nausea with rare vomiting.  Started 2 weeks ago.  Noted that she took some azithromycin  for 5 days for sinusitis symptoms started shortly after starting that therapy.  Historically GERD well-controlled on pantoprazole  40 mg once daily.  Constipation managed well with MiraLAX  daily on a as needed basis.  Has not had a fever or any chills no rectal bleeding  Past Medical History:  Diagnosis Date   Anxiety    Asthma    Back pain, chronic    CKD (chronic kidney disease) stage 3, GFR 30-59 ml/min (HCC)    Compression fx, thoracic spine (HCC)    T - 11   COPD (chronic obstructive pulmonary disease) (HCC) 12/16/2015   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HCC)    Diverticulitis    Essential hypertension    GERD (gastroesophageal reflux disease)    HOH (hard of hearing)    Inflammatory arthritis    Iron  deficiency anemia 01/2012   Multilevel degenerative disc disease    Nausea with vomiting 01/14/2020   Pulmonary eosinophilia (HCC)    Spinal stenosis    bilateral, lumbar   Tubular adenoma 11/2012   Wears dentures    full upper and lower   Wears hearing aid in both ears     Past Surgical History:  Procedure Laterality Date   ABDOMINAL HYSTERECTOMY     BACK SURGERY     BROW LIFT Bilateral 01/20/2023   Procedure: BLEPHAROPLASTY UPPER EYELID; W/EXCESS SKIN BLEPHAROPTOSIS REPAIR; RESECT EX BILATERAL;  Surgeon: Ashley Greig HERO, MD;  Location: [facility];  Service: Ophthalmology;  Laterality: Bilateral;   CARPAL TUNNEL RELEASE Right 06/2012   CATARACT EXTRACTION W/PHACO Left 11/19/2014   Procedure: CATARACT EXTRACTION PHACO AND INTRAOCULAR LENS PLACEMENT (IOC);  Surgeon: Dow JULIANNA Burke, MD;  Location: [facility];  Service: Ophthalmology;  Laterality: Left;  CDE: 4.77   CATARACT  EXTRACTION W/PHACO Right 02/03/2015   Procedure: CATARACT EXTRACTION PHACO AND INTRAOCULAR LENS PLACEMENT (IOC);  Surgeon: Dow JULIANNA Burke, MD;  Location: [facility];  Service: Ophthalmology;  Laterality: Right;  CDE: 4.54   COLONOSCOPY  06/19/2008   RMR: tortuous and elongated colon with scattered left-sided diverticula/colonic mucosa appeared entirely normal. Prior colonic ulcers had resolved.   COLONOSCOPY  12/2007   Dr. Shaaron: Scattered diffuse sigmoid diverticula, 2 areas of ulceration at the hepatic flexure. Biopsies unremarkable.   COLONOSCOPY N/A 11/20/2012   next TCS 11/2017   COLONOSCOPY WITH PROPOFOL  N/A 12/22/2017   Procedure: COLONOSCOPY WITH PROPOFOL ;  Surgeon: Shaaron Lamar HERO, MD; diverticula in the sigmoid and descending colon and 2 tubular adenomas.  No recommendations to repeat.     DECOMPRESSIVE LUMBAR LAMINECTOMY LEVEL 2  04/20/2012   Procedure: DECOMPRESSIVE LUMBAR LAMINECTOMY LEVEL 2;  Surgeon: Tanda DELENA Heading, MD;  Location: [facility];  Service: Orthopedics;  Laterality: Right;  Decompressive Lumbar Laminectomy of the L4 - L5 and L5 - S1 Complete/Laminectomy L5 on the Right (X-Ray)   ESOPHAGOGASTRODUODENOSCOPY  12/2007   Dr. Shaaron: Possible cervical esophageal whip, noncritical Schatzki ring status post dilation. Small hiatal hernia. Slightly pale duodenal mucosa (biopsy unremarkable)   ESOPHAGOGASTRODUODENOSCOPY (EGD) WITH PROPOFOL  N/A 01/20/2017   Dr. Shaaron: Medium sized hiatal hernia empiric esophageal dilation for history of dysphagia   ESOPHAGOGASTRODUODENOSCOPY (EGD) WITH PROPOFOL  N/A 04/08/2020   Surgeon:  Cindie Dunnings K, DO;  medium size hiatal hernia 3 to 4 cm chicken bone lodged in the gastric antrum just proximal to the pylorus removed with a snare with mild bleeding at the site of lodged bone.  Normal examined duodenum.   ESOPHAGOGASTRODUODENOSCOPY (EGD) WITH PROPOFOL  N/A 02/10/2022   Procedure: ESOPHAGOGASTRODUODENOSCOPY (EGD) WITH PROPOFOL ;  Surgeon: Shaaron Lamar HERO, MD;  Location: [facility];  Service: Endoscopy;  Laterality: N/A;  8:30 AM   EYE SURGERY     BIL CATARACTS   FOREIGN BODY REMOVAL N/A 04/08/2020   Procedure: FOREIGN BODY REMOVAL;  Surgeon: Cindie Dunnings POUR, DO;  Location: [facility];  Service: Endoscopy;  Laterality: N/A;   INCISIONAL HERNIA REPAIR N/A 11/12/2020   Procedure: HERNIA REPAIR INCISIONAL;  Surgeon: Mavis Anes, MD;  Location: [facility];  Service: General;  Laterality: N/A;   IR KYPHO THORACIC WITH BONE BIOPSY  02/08/2017   IR RADIOLOGIST EVAL & MGMT  02/02/2017   JOINT REPLACEMENT     KNEE ARTHROSCOPY Right    MALONEY DILATION N/A 01/20/2017   Procedure: MALONEY DILATION;  Surgeon: Shaaron Lamar HERO, MD;  Location: [facility];  Service: Endoscopy;  Laterality: N/A;   MALONEY DILATION N/A 02/10/2022   Procedure: AGAPITO DILATION;  Surgeon: Shaaron Lamar HERO, MD;  Location: [facility];  Service: Endoscopy;  Laterality: N/A;   POLYPECTOMY  12/22/2017   Procedure: POLYPECTOMY;  Surgeon: Shaaron Lamar HERO, MD;  Location: [facility];  Service: Endoscopy;;   REVERSE SHOULDER ARTHROPLASTY Right 05/24/2014   Procedure: RIGHT SHOULDER REVERSE ARTHROPLASTY;  Surgeon: Marcey Her, MD;  Location[facility];  Service: Orthopedics;  Laterality: Right;   REVERSE SHOULDER ARTHROPLASTY Left 06/10/2017   Procedure: LEFT REVERSE SHOULDER ARTHROPLASTY;  Surgeon: Her Marcey, MD;  Location: [facility];  Service: Orthopedics;  Laterality: Left;   TOTAL KNEE ARTHROPLASTY  01/24/2012   Procedure: TOTAL KNEE ARTHROPLASTY;  Surgeon: Dempsey LULLA Moan, MD;  Location: [facility];  Service: Orthopedics;  Laterality: Right;   TOTAL KNEE ARTHROPLASTY Left 10/15/2019   Procedure: TOTAL KNEE ARTHROPLASTY;  Surgeon: Moan Dempsey, MD;  Location: [facility];  Service: Orthopedics;  Laterality: Left;    TUBAL LIGATION      Prior to Admission medications   Medication Sig Start Date End Date Taking? Authorizing Provider  albuterol  (PROAIR  HFA) 108 (90 Base)  MCG/ACT inhaler Inhale 2 puffs into the lungs every 6 (six) hours as needed for wheezing or shortness of breath (For COPD).    Yes [provider]  ALPRAZolam  (XANAX ) 0.5 MG tablet Take 0.5 tablets (0.25 mg total) by mouth 2 (two) times daily as needed for anxiety. 10/25/19  Yes Landy Barnie RAMAN, NP  Benralizumab  30 MG/ML SOSY Inject 30 mg into the skin See admin instructions. Inject 30 mg into the skin every 56 Days 10/17/19  Yes [provider]  calcitRIOL (ROCALTROL) 0.25 MCG capsule Take 0.25 mcg by mouth 2 (two) times a week. Patient taking differently: Take 0.25 mcg by mouth 3 (three) times a week. 09/29/22  Yes [provider]  carvedilol  (COREG ) 6.25 MG tablet Take 1 tablet (6.25 mg total) by mouth 2 (two) times daily with a meal. 08/26/23  Yes Janene Boer, PA  Cholecalciferol (VITAMIN D3) 25 MCG (1000 UT) CAPS Take by mouth 2 (two) times a week.   Yes [provider]  cyanocobalamin  2000 MCG tablet Take 2,000 mcg by mouth daily. 09/25/21  Yes [provider]  denosumab  (PROLIA ) 60 MG/ML SOSY  injection Inject 60 mg into the skin every 6 (six) months. 03/01/18  Yes [provider]  DULERA  200-5 MCG/ACT AERO Inhale 2 puffs into the lungs 2 (two) times daily. 10/25/19  Yes Landy Barnie RAMAN, NP  erythromycin  ophthalmic ointment SMARTSIG:In Eye(s)   Yes [provider]  ferrous sulfate  325 (65 FE) MG tablet Take 1 tablet (325 mg total) by mouth daily with breakfast. 10/25/19  Yes Landy Barnie RAMAN, NP  fexofenadine (ALLEGRA) 180 MG tablet Take 180 mg by mouth daily.   Yes [provider]  fluticasone  (FLONASE ) 50 MCG/ACT nasal spray Place 1 spray into both nostrils daily as needed for allergies or rhinitis.  10/17/19  Yes [provider]  ipratropium-albuterol  (DUONEB) 0.5-2.5 (3) MG/3ML SOLN Take 3 mLs by nebulization every 4 (four) hours as needed (shortness of breath).   Yes [provider]  ketotifen  (ZADITOR ) 0.025 %  ophthalmic solution Place 1 drop into both eyes 2 (two) times daily as needed (allergies). 10/25/19  Yes Landy Barnie RAMAN, NP  montelukast  (SINGULAIR ) 10 MG tablet Take 1 tablet (10 mg total) by mouth at bedtime. 07/25/20  Yes Acharya, Gayatri A, MD  Oxycodone  HCl 10 MG TABS Take 10 mg by mouth every 6 (six) hours as needed (pain). 12/29/19  Yes [provider]  pantoprazole  (PROTONIX ) 40 MG tablet Take 1 tablet (40 mg total) by mouth 2 (two) times daily before a meal. 08/02/23  Yes Shirlean Therisa ORN, NP  Polyethyl Glycol-Propyl Glycol (SYSTANE OP) Place 1 drop into both eyes 3 (three) times daily as needed (dry/irritated eyes.).   Yes [provider]  polyethylene glycol (MIRALAX  / GLYCOLAX ) 17 g packet Take 17 g by mouth daily. 11/07/20  Yes Tegeler, Lonni PARAS, MD  potassium chloride  (KLOR-CON ) 10 MEQ tablet Take 40 meq(4 tab) on Monday, Wednesday and Friday, all other days take 20 meq(2 tab) by mouth daily 06/02/23  Yes Janene, Hao, PA  pravastatin  (PRAVACHOL ) 10 MG tablet Take 10 mg by mouth daily. 03/27/20  Yes [provider]  prednisoLONE acetate (PRED FORTE) 1 % ophthalmic suspension PLACE ONE DROP IN EACH EYE TWICE DAILY FOR ONE WEEK THEN ONCE DAILY FOR ONE WEEK. THEN STOP   Yes [provider]  torsemide  (DEMADEX ) 20 MG tablet TAKE 2 TABLETS BY MOUTH ON MONDAY, WEDNESDAY, AND FRIDAY. ALL OTHER DAYS TAKE 1 TABLET DAILY. 02/07/23  Yes Loni Soyla LABOR, MD  triamcinolone  cream (KENALOG ) 0.1 % SMARTSIG:1 Application Topical 2-3 Times Daily   Yes [provider]    Allergies as of 10/25/2023 - Review Complete 10/25/2023  Allergen Reaction Noted   Flexeril [cyclobenzaprine] Anaphylaxis 01/12/2012   Other Anaphylaxis and Other (See Comments) 02/08/2011   Keflex  [cephalexin ] Itching 05/26/2012   Aspirin Other (See Comments) 05/05/2015   Erythromycin  Itching 02/28/2023   Fentanyl  Swelling 11/09/2020   Adhesive [tape] Rash 11/01/2014   Chlorhexidine  Rash  11/19/2014    Family History  Problem Relation Age of Onset   Breast cancer Sister    Colon cancer Sister        two deceased, one living and terminal, one current undergoing treatment, ages 42, 24, 27, 67   Hypertension Mother    Stroke Mother    Heart attack Mother    Hypertension Father    Prostate cancer Father     Social History   Socioeconomic History   Marital status: Single    Spouse name: Not on file   Number of children: 3   Years of  education: Not on file   Highest education level: Not on file  Occupational History   Not on file  Tobacco Use   Smoking status: Former    Current packs/day: 0.00    Average packs/day: 2.0 packs/day for 40.0 years (80.0 ttl pk-yrs)    Types: Cigarettes    Start date: 01/19/1952    Quit date: 01/19/1992    Years since quitting: 31.7   Smokeless tobacco: Never  Vaping Use   Vaping status: Never Used  Substance and Sexual Activity   Alcohol  use: No   Drug use: No   Sexual activity: Not Currently  Other Topics Concern   Not on file  Social History Narrative   Not on file   Social Drivers of Health   Financial Resource Strain: Not on file  Food Insecurity: No Food Insecurity (05/27/2023)   Received from [facility]   Hunger Vital Sign    Within the past 12 months, you worried that your food would run out before you got the money to buy more.: Never true    Within the past 12 months, the food you bought just didn't last and you didn't have money to get more.: Never true  Transportation Needs: No Transportation Needs (05/27/2023)   Received from [facility]   PRAPARE - Transportation    Lack of Transportation (Medical): No    Lack of Transportation (Non-Medical): No  Physical Activity: Not on file  Stress: Not on file  Social Connections: Not on file  Intimate Partner Violence: Not At Risk (05/27/2023)   Received from [facility]   Humiliation, Afraid, Rape, and Kick questionnaire    Within the last year, have you  been afraid of your partner or ex-partner?: No    Within the last year, have you been humiliated or emotionally abused in other ways by your partner or ex-partner?: No    Within the last year, have you been kicked, hit, slapped, or otherwise physically hurt by your partner or ex-partner?: No    Within the last year, have you been raped or forced to have any kind of sexual activity by your partner or ex-partner?: No    Review of Systems: See HPI, otherwise negative ROS  Physical Exam: BP 121/83 (BP Location: Left Arm, Patient Position: Sitting, Cuff Size: Large)   Pulse 89   Temp 97.8 F (36.6 C) (Oral)   Ht 4' 11 (1.499 m)   Wt 187 lb 9.6 oz (85.1 kg)   SpO2 96%   BMI 37.89 kg/m  General:   Alert,  Well-developed, well-nourished, pleasant and cooperative in NAD Heart:  Regular rate and rhythm; no murmurs, clicks, rubs,  or gallops. Abdomen: Non-distended, normal bowel sounds.  Soft and nontender without appreciable mass or hepatosplenomegaly.   Impression/Plan: Nausea with rare emesis over the past 2 weeks.  Onset temporally related to a 5-day course of azithromycin  for sinusitis.  No abdominal pain.  GERD historically well-controlled on once daily pantoprazole .  Constipation continues to be well-managed with MiraLAX  I suspect she has developed GI upset secondary to the effects of azithromycin .  Recommendations:  For the next 2 weeks increase your pantoprazole  to 40 mg twice daily-best taken before breakfast and supper then decrease to once daily thereafter  New prescription for Zofran  4 mg ODT tablets.  Put one under your tongue 3 times a day as needed for nausea.  Dispense 60 with 1 refill  Continue taking MiraLAX  without a change in dosing  Call  if no better in 1 week  Please visit here in 6 months   Notice: This dictation was prepared with Dragon dictation along with smaller phrase technology. Any transcriptional errors that result from this process are unintentional and  may not be corrected upon review.

## 2023-10-25 NOTE — Patient Instructions (Signed)
 Nice to see you again today!  I suspect you have developed upset stomach taking azithromycin  recently.  For the next 2 weeks increase your pantoprazole  to 40 mg twice daily-best taken before breakfast and supper then decrease to once daily thereafter  New prescription for Zofran  4 mg ODT tablets.  Put one under your tongue 3 times a day as needed for nausea.  Dispense 60 with 1 refill  Continue taking MiraLAX  without a change in dosing  If You are not feeling much better in 1 week let me know.  Please visit here in 6 months

## 2023-11-02 ENCOUNTER — Telehealth: Payer: Self-pay | Admitting: Internal Medicine

## 2023-11-02 MED ORDER — TORSEMIDE 20 MG PO TABS: ORAL_TABLET | ORAL | 3 refills | Status: AC

## 2023-11-02 NOTE — Telephone Encounter (Signed)
*  STAT* If patient is at the pharmacy, call can be transferred to refill team.   1. Which medications need to be refilled? (please list name of each medication and dose if known)   torsemide  (DEMADEX ) 20 MG tablet    2. Which pharmacy/location (including street and city if local pharmacy) is medication to be sent to?  [facility] - [place], [place] - [address]      3. Do they need a 30 day or 90 day supply? 90 day    Pt is out of medication

## 2023-11-02 NOTE — Telephone Encounter (Signed)
 RX sent in

## 2023-11-08 DIAGNOSIS — J449 Chronic obstructive pulmonary disease, unspecified: Secondary | ICD-10-CM | POA: Diagnosis not present

## 2023-11-08 DIAGNOSIS — J302 Other seasonal allergic rhinitis: Secondary | ICD-10-CM | POA: Diagnosis not present

## 2023-11-08 DIAGNOSIS — K5909 Other constipation: Secondary | ICD-10-CM | POA: Diagnosis not present

## 2023-11-08 DIAGNOSIS — J4541 Moderate persistent asthma with (acute) exacerbation: Secondary | ICD-10-CM | POA: Diagnosis not present

## 2023-11-08 DIAGNOSIS — I10 Essential (primary) hypertension: Secondary | ICD-10-CM | POA: Diagnosis not present

## 2023-11-08 DIAGNOSIS — F411 Generalized anxiety disorder: Secondary | ICD-10-CM | POA: Diagnosis not present

## 2023-11-08 DIAGNOSIS — I5032 Chronic diastolic (congestive) heart failure: Secondary | ICD-10-CM | POA: Diagnosis not present

## 2023-11-08 DIAGNOSIS — E1122 Type 2 diabetes mellitus with diabetic chronic kidney disease: Secondary | ICD-10-CM | POA: Diagnosis not present

## 2023-11-08 DIAGNOSIS — M545 Low back pain, unspecified: Secondary | ICD-10-CM | POA: Diagnosis not present

## 2023-11-08 DIAGNOSIS — E782 Mixed hyperlipidemia: Secondary | ICD-10-CM | POA: Diagnosis not present

## 2023-11-08 DIAGNOSIS — N1832 Chronic kidney disease, stage 3b: Secondary | ICD-10-CM | POA: Diagnosis not present

## 2023-11-08 DIAGNOSIS — D509 Iron deficiency anemia, unspecified: Secondary | ICD-10-CM | POA: Diagnosis not present

## 2023-11-10 DIAGNOSIS — Z5181 Encounter for therapeutic drug level monitoring: Secondary | ICD-10-CM | POA: Diagnosis not present

## 2023-11-10 DIAGNOSIS — R2689 Other abnormalities of gait and mobility: Secondary | ICD-10-CM | POA: Diagnosis not present

## 2023-11-10 DIAGNOSIS — M5459 Other low back pain: Secondary | ICD-10-CM | POA: Diagnosis not present

## 2023-11-10 DIAGNOSIS — Z9189 Other specified personal risk factors, not elsewhere classified: Secondary | ICD-10-CM | POA: Diagnosis not present

## 2023-11-10 DIAGNOSIS — Z79899 Other long term (current) drug therapy: Secondary | ICD-10-CM | POA: Diagnosis not present

## 2023-11-10 DIAGNOSIS — G894 Chronic pain syndrome: Secondary | ICD-10-CM | POA: Diagnosis not present

## 2023-11-11 DIAGNOSIS — I10 Essential (primary) hypertension: Secondary | ICD-10-CM | POA: Diagnosis not present

## 2023-11-11 DIAGNOSIS — I5022 Chronic systolic (congestive) heart failure: Secondary | ICD-10-CM | POA: Diagnosis not present

## 2023-11-11 DIAGNOSIS — E782 Mixed hyperlipidemia: Secondary | ICD-10-CM | POA: Diagnosis not present

## 2023-11-11 DIAGNOSIS — E1122 Type 2 diabetes mellitus with diabetic chronic kidney disease: Secondary | ICD-10-CM | POA: Diagnosis not present

## 2023-11-18 DIAGNOSIS — H04123 Dry eye syndrome of bilateral lacrimal glands: Secondary | ICD-10-CM | POA: Diagnosis not present

## 2023-11-18 DIAGNOSIS — H11823 Conjunctivochalasis, bilateral: Secondary | ICD-10-CM | POA: Diagnosis not present

## 2023-11-21 ENCOUNTER — Other Ambulatory Visit: Payer: Self-pay | Admitting: Internal Medicine

## 2023-11-25 DIAGNOSIS — D509 Iron deficiency anemia, unspecified: Secondary | ICD-10-CM | POA: Diagnosis not present

## 2023-11-25 DIAGNOSIS — E1122 Type 2 diabetes mellitus with diabetic chronic kidney disease: Secondary | ICD-10-CM | POA: Diagnosis not present

## 2023-11-25 DIAGNOSIS — N1832 Chronic kidney disease, stage 3b: Secondary | ICD-10-CM | POA: Diagnosis not present

## 2023-11-25 DIAGNOSIS — J4550 Severe persistent asthma, uncomplicated: Secondary | ICD-10-CM | POA: Diagnosis not present

## 2023-11-25 DIAGNOSIS — J302 Other seasonal allergic rhinitis: Secondary | ICD-10-CM | POA: Diagnosis not present

## 2023-11-25 DIAGNOSIS — K5909 Other constipation: Secondary | ICD-10-CM | POA: Diagnosis not present

## 2023-11-25 DIAGNOSIS — I5032 Chronic diastolic (congestive) heart failure: Secondary | ICD-10-CM | POA: Diagnosis not present

## 2023-11-25 DIAGNOSIS — J449 Chronic obstructive pulmonary disease, unspecified: Secondary | ICD-10-CM | POA: Diagnosis not present

## 2023-11-25 DIAGNOSIS — J4541 Moderate persistent asthma with (acute) exacerbation: Secondary | ICD-10-CM | POA: Diagnosis not present

## 2023-11-25 DIAGNOSIS — I10 Essential (primary) hypertension: Secondary | ICD-10-CM | POA: Diagnosis not present

## 2023-11-25 DIAGNOSIS — F411 Generalized anxiety disorder: Secondary | ICD-10-CM | POA: Diagnosis not present

## 2023-11-25 DIAGNOSIS — M545 Low back pain, unspecified: Secondary | ICD-10-CM | POA: Diagnosis not present

## 2023-11-25 DIAGNOSIS — E782 Mixed hyperlipidemia: Secondary | ICD-10-CM | POA: Diagnosis not present

## 2023-11-25 DIAGNOSIS — D7219 Other eosinophilia: Secondary | ICD-10-CM | POA: Diagnosis not present

## 2023-12-06 DIAGNOSIS — D509 Iron deficiency anemia, unspecified: Secondary | ICD-10-CM | POA: Diagnosis not present

## 2023-12-06 DIAGNOSIS — I10 Essential (primary) hypertension: Secondary | ICD-10-CM | POA: Diagnosis not present

## 2023-12-06 DIAGNOSIS — J4541 Moderate persistent asthma with (acute) exacerbation: Secondary | ICD-10-CM | POA: Diagnosis not present

## 2023-12-06 DIAGNOSIS — F411 Generalized anxiety disorder: Secondary | ICD-10-CM | POA: Diagnosis not present

## 2023-12-06 DIAGNOSIS — E1122 Type 2 diabetes mellitus with diabetic chronic kidney disease: Secondary | ICD-10-CM | POA: Diagnosis not present

## 2023-12-06 DIAGNOSIS — J449 Chronic obstructive pulmonary disease, unspecified: Secondary | ICD-10-CM | POA: Diagnosis not present

## 2023-12-06 DIAGNOSIS — K5909 Other constipation: Secondary | ICD-10-CM | POA: Diagnosis not present

## 2023-12-06 DIAGNOSIS — N1832 Chronic kidney disease, stage 3b: Secondary | ICD-10-CM | POA: Diagnosis not present

## 2023-12-06 DIAGNOSIS — M545 Low back pain, unspecified: Secondary | ICD-10-CM | POA: Diagnosis not present

## 2023-12-06 DIAGNOSIS — E782 Mixed hyperlipidemia: Secondary | ICD-10-CM | POA: Diagnosis not present

## 2023-12-06 DIAGNOSIS — J302 Other seasonal allergic rhinitis: Secondary | ICD-10-CM | POA: Diagnosis not present

## 2023-12-06 DIAGNOSIS — I5032 Chronic diastolic (congestive) heart failure: Secondary | ICD-10-CM | POA: Diagnosis not present

## 2023-12-13 DIAGNOSIS — I10 Essential (primary) hypertension: Secondary | ICD-10-CM | POA: Diagnosis not present

## 2023-12-13 DIAGNOSIS — E1122 Type 2 diabetes mellitus with diabetic chronic kidney disease: Secondary | ICD-10-CM | POA: Diagnosis not present

## 2023-12-13 DIAGNOSIS — I5022 Chronic systolic (congestive) heart failure: Secondary | ICD-10-CM | POA: Diagnosis not present

## 2023-12-13 DIAGNOSIS — E782 Mixed hyperlipidemia: Secondary | ICD-10-CM | POA: Diagnosis not present

## 2023-12-14 NOTE — Therapy (Signed)
 OUTPATIENT PHYSICAL THERAPY EVALUATION   Patient Name: Glenda Terry MRN: [id number] DOB:05-08-1940, 83 y.o., female Today's Date: 12/16/2023  END OF SESSION:  PT End of Session - 12/16/23 1124     Visit Number 1    Number of Visits 12    Date for Recertification  03/09/24    PT Start Time 1010    PT Stop Time 1100    PT Time Calculation (min) 50 min    Activity Tolerance Patient limited by pain    Behavior During Therapy [facility] for tasks assessed/performed          Past Medical History:  Diagnosis Date   Anxiety    Asthma    Back pain, chronic    CKD (chronic kidney disease) stage 3, GFR 30-59 ml/min (HCC)    Compression fx, thoracic spine (HCC)    T - 11   COPD (chronic obstructive pulmonary disease) (HCC) 12/16/2015   Cushing's syndrome    Diverticulitis    Essential hypertension    GERD (gastroesophageal reflux disease)    HOH (hard of hearing)    Inflammatory arthritis    Iron  deficiency anemia 01/2012   Multilevel degenerative disc disease    Nausea with vomiting 01/14/2020   Pulmonary eosinophilia    Spinal stenosis    bilateral, lumbar   Tubular adenoma 11/2012   Wears dentures    full upper and lower   Wears hearing aid in both ears    Past Surgical History:  Procedure Laterality Date   ABDOMINAL HYSTERECTOMY     BACK SURGERY     BROW LIFT Bilateral 01/20/2023   Procedure: BLEPHAROPLASTY UPPER EYELID; W/EXCESS SKIN BLEPHAROPTOSIS REPAIR; RESECT EX BILATERAL;  Surgeon: Ashley Greig HERO, MD;  Location: [facility];  Service: Ophthalmology;  Laterality: Bilateral;   CARPAL TUNNEL RELEASE Right 06/2012   CATARACT EXTRACTION W/PHACO Left 11/19/2014   Procedure: CATARACT EXTRACTION PHACO AND INTRAOCULAR LENS PLACEMENT (IOC);  Surgeon: Dow JULIANNA Burke, MD;  Location: [facility];  Service: Ophthalmology;  Laterality: Left;  CDE: 4.77   CATARACT EXTRACTION W/PHACO Right 02/03/2015   Procedure: CATARACT EXTRACTION PHACO AND INTRAOCULAR LENS PLACEMENT  (IOC);  Surgeon: Dow JULIANNA Burke, MD;  Location: [facility];  Service: Ophthalmology;  Laterality: Right;  CDE: 4.54   COLONOSCOPY  06/19/2008   RMR: tortuous and elongated colon with scattered left-sided diverticula/colonic mucosa appeared entirely normal. Prior colonic ulcers had resolved.   COLONOSCOPY  12/2007   Dr. Shaaron: Scattered diffuse sigmoid diverticula, 2 areas of ulceration at the hepatic flexure. Biopsies unremarkable.   COLONOSCOPY N/A 11/20/2012   next TCS 11/2017   COLONOSCOPY WITH PROPOFOL  N/A 12/22/2017   Procedure: COLONOSCOPY WITH PROPOFOL ;  Surgeon: Shaaron Lamar HERO, MD; diverticula in the sigmoid and descending colon and 2 tubular adenomas.  No recommendations to repeat.     DECOMPRESSIVE LUMBAR LAMINECTOMY LEVEL 2  04/20/2012   Procedure: DECOMPRESSIVE LUMBAR LAMINECTOMY LEVEL 2;  Surgeon: Tanda DELENA Heading, MD;  Location: [facility];  Service: Orthopedics;  Laterality: Right;  Decompressive Lumbar Laminectomy of the L4 - L5 and L5 - S1 Complete/Laminectomy L5 on the Right (X-Ray)   ESOPHAGOGASTRODUODENOSCOPY  12/2007   Dr. Shaaron: Possible cervical esophageal whip, noncritical Schatzki ring status post dilation. Small hiatal hernia. Slightly pale duodenal mucosa (biopsy unremarkable)   ESOPHAGOGASTRODUODENOSCOPY (EGD) WITH PROPOFOL  N/A 01/20/2017   Dr. Shaaron: Medium sized hiatal hernia empiric esophageal dilation for history of dysphagia   ESOPHAGOGASTRODUODENOSCOPY (EGD) WITH PROPOFOL  N/A 04/08/2020   Surgeon: Cindie,  Charles K, DO;  medium size hiatal hernia 3 to 4 cm chicken bone lodged in the gastric antrum just proximal to the pylorus removed with a snare with mild bleeding at the site of lodged bone.  Normal examined duodenum.   ESOPHAGOGASTRODUODENOSCOPY (EGD) WITH PROPOFOL  N/A 02/10/2022   Procedure: ESOPHAGOGASTRODUODENOSCOPY (EGD) WITH PROPOFOL ;  Surgeon: Shaaron Lamar HERO, MD;  Location: [facility];  Service: Endoscopy;  Laterality: N/A;  8:30 AM   EYE SURGERY     BIL  CATARACTS   FOREIGN BODY REMOVAL N/A 04/08/2020   Procedure: FOREIGN BODY REMOVAL;  Surgeon: Cindie Carlin POUR, DO;  Location: [facility];  Service: Endoscopy;  Laterality: N/A;   INCISIONAL HERNIA REPAIR N/A 11/12/2020   Procedure: HERNIA REPAIR INCISIONAL;  Surgeon: Mavis Anes, MD;  Location: [facility];  Service: General;  Laterality: N/A;   IR KYPHO THORACIC WITH BONE BIOPSY  02/08/2017   IR RADIOLOGIST EVAL & MGMT  02/02/2017   JOINT REPLACEMENT     KNEE ARTHROSCOPY Right    MALONEY DILATION N/A 01/20/2017   Procedure: MALONEY DILATION;  Surgeon: Shaaron Lamar HERO, MD;  Location: [facility];  Service: Endoscopy;  Laterality: N/A;   MALONEY DILATION N/A 02/10/2022   Procedure: AGAPITO DILATION;  Surgeon: Shaaron Lamar HERO, MD;  Location: [facility];  Service: Endoscopy;  Laterality: N/A;   POLYPECTOMY  12/22/2017   Procedure: POLYPECTOMY;  Surgeon: Shaaron Lamar HERO, MD;  Location: [facility];  Service: Endoscopy;;   REVERSE SHOULDER ARTHROPLASTY Right 05/24/2014   Procedure: RIGHT SHOULDER REVERSE ARTHROPLASTY;  Surgeon: Marcey Her, MD;  Location: [facility];  Service: Orthopedics;  Laterality: Right;   REVERSE SHOULDER ARTHROPLASTY Left 06/10/2017   Procedure: LEFT REVERSE SHOULDER ARTHROPLASTY;  Surgeon: Her Marcey, MD;  Location: [facility];  Service: Orthopedics;  Laterality: Left;   TOTAL KNEE ARTHROPLASTY  01/24/2012   Procedure: TOTAL KNEE ARTHROPLASTY;  Surgeon: Dempsey LULLA Moan, MD;  Location: [facility];  Service: Orthopedics;  Laterality: Right;   TOTAL KNEE ARTHROPLASTY Left 10/15/2019   Procedure: TOTAL KNEE ARTHROPLASTY;  Surgeon: Moan Dempsey, MD;  Location: [facility];  Service: Orthopedics;  Laterality: Left;    TUBAL LIGATION     Patient Active Problem List   Diagnosis Date Noted   LLQ abdominal pain 07/01/2022   Edema of lower extremity 03/25/2022   Neuropathy 03/25/2022   Disorder of vitamin B12 03/25/2022   Nausea without vomiting 11/09/2021   Hypokalemia  01/07/2021   Hypermagnesemia 01/07/2021   Hiatal hernia 01/07/2021   Leukocytosis 01/07/2021   Mixed hyperlipidemia 01/07/2021   Chronic diastolic CHF (congestive heart failure) (HCC) 01/07/2021   Chronic pain syndrome 01/07/2021   Obesity (BMI 35.0-39.9 without comorbidity) 01/07/2021   Incisional hernia, without obstruction or gangrene    Fibromyalgia 10/08/2020   Chronic insomnia 10/08/2020   Chronic systolic heart failure (HCC) 10/08/2020   Morbid obesity (HCC) 10/08/2020   Generalized anxiety disorder 10/08/2020   Type 2 diabetes mellitus (HCC) 10/05/2020   Chronic bilateral low back pain 10/25/2019   History of total knee replacement, left 10/23/2019   Chronic constipation 10/22/2019   Bilateral lower extremity edema 10/22/2019   Chronic anemia 10/22/2019   Post-menopausal osteoporosis 10/22/2019   Localized edema 10/22/2019   Tachycardia 10/18/2019   Primary osteoarthritis of left knee 10/15/2019   Carpal tunnel syndrome of left wrist 08/23/2019   Degenerative scoliosis 11/27/2018   Other secondary scoliosis, thoracolumbar region 11/01/2018   History of total right knee replacement 02/16/2018  Hx of adenomatous colonic polyps 11/28/2017   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 10/04/2017   Other intervertebral disc degeneration, lumbar region 07/26/2017   S/P shoulder replacement, 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   Eosinophil count raised 06/09/2016   Severe persistent asthma (HCC) 06/07/2016   COPD (chronic obstructive pulmonary disease) (HCC) 12/16/2015   CKD (chronic kidney disease) stage 3, GFR 30-59 ml/min (HCC) 09/20/2015   Hemorrhoids, internal 08/21/2015   Microcytic anemia 11/01/2012   Spinal stenosis of lumbar region without neurogenic claudication 04/20/2012   Herniated lumbar intervertebral disc 04/20/2012   Difficulty walking 03/13/2012   Stiffness of joint, not elsewhere classified, lower leg 03/13/2012   Weakness of right leg 03/13/2012    Difficulty walking 03/13/2012   Anxiety 02/09/2011   HTN (hypertension) 02/09/2011   GERD (gastroesophageal reflux disease) 02/09/2011   Rheumatoid arthritis (HCC) 02/09/2011    PCP: Shona Norleen PEDLAR, MD   REFERRING PROVIDER: Faye Lauraine PARAS, FNP   REFERRING DIAG: R26.89 (ICD-10-CM) - Other abnormalities of gait and mobility   Rationale for Evaluation and Treatment:  Rehabiliation  THERAPY DIAG:  Other abnormalities of gait and mobility  Muscle weakness (generalized)  Other low back pain  ONSET DATE: Chronic condition, worse since fall May 2025   SUBJECTIVE:                                                                                                                                                                                           SUBJECTIVE STATEMENT: Relays she has walking and balance problems, widespread pain all the time, says she is dealing with the three A's (age, arthritis, asthma). She did have a bad in May 2025, she is unsure what happened but hit the floor hard and still feels injured from this fall in her right leg. She does use cane and walker.   PERTINENT HISTORY:  See above PMH  PAIN:  NPRS scale: 4-8/10 upon arrival Pain location:back and all over, shoulders, elbows, knees, hands, feet,  Pain description: constant, achy, sharp Aggravating factors: just there all the time Relieving factors: rest, laying down on back   PRECAUTIONS: ,  Fall  RED FLAGS: None   WEIGHT BEARING RESTRICTIONS:  No  FALLS:  Has patient fallen in last 6 months? Yes. Number of falls 1, bad fall in May  PLOF:  Needs assistance with ADLs, she has aide come help her with housework 4 hours a day  PATIENT GOALS:  Help with some of the pain and walk better  OBJECTIVE:  Note: Objective measures were completed at Evaluation unless otherwise noted.  DIAGNOSTIC FINDINGS:  Throacic XR IMPRESSION: 1. No  definite acute fracture in the thoracic spine. 2. Chronic  compression fractures of T10, T11, and T12. Lumabar XR IMPRESSION: 1. No acute fracture identified. 2. Stable mild compression deformities of T12, L1 and L4. 3. 5 mm of anterolisthesis at L5-S1 has slightly progressed.  PATIENT SURVEYS:  Patient-Specific Activity Scoring Scheme  0 represents "unable to perform." 10 represents "able to perform at prior level. 0 1 2 3 4 5 6 7 8 9  10 (Date and Score)   Activity Eval     1. waling 6    2. Endurance 4    3.     4.    5.    Score 5/10    Total score = sum of the activity scores/number of activities Minimum detectable change (90%CI) for average score = 2 points Minimum detectable change (90%CI) for single activity score = 3 points  POSTURE:  increased thoracic kyphosis and flexed trunk   GAIT: Assistive device utilized: Single point cane Level of assistance: SBA Comments: very unsteady and can only ambulate short distances due to fatigue and Shortness or breath.     UPPER EXTREMITY MMT:  MMT Right eval Left eval  Shoulder flexion 4 4  Shoulder extension    Shoulder abduction 4 4  Shoulder adduction    Shoulder extension    Shoulder internal rotation 4 4  Shoulder external rotation 4 4  Middle trapezius    Lower trapezius    Elbow flexion 4 4  Elbow extension 4 4  Wrist flexion    Wrist extension    Wrist ulnar deviation    Wrist radial deviation    Wrist pronation    Wrist supination    Grip strength     (Blank rows = not tested)   LOWER EXTREMITY MMT:    MMT Right eval Left eval  Hip flexion 3 3  Hip extension    Hip abduction 3 3  Hip adduction    Hip internal rotation    Hip external rotation    Knee flexion 3+ 4  Knee extension 3+ 4  Ankle dorsiflexion 3 4  Ankle plantarflexion    Ankle inversion    Ankle eversion     (Blank rows = not tested)   FUNCTIONAL TESTS:  12/16/23 30 seconds chair stand test: 7 reps without UE support Timed up and go (TUG): 48 seconds without AD, but needed  Min A, 2 different times due to loss of balance Balance with feet together eyes open: 20 seconds Balance with feet together eyes closed: 2 seconds                                                                                                                              TREATMENT DATE:  Eval HEP creation and review with demonstration and trial set preformed, see below for details Selfcare: recommendation to use AD for gait to reduce risk of falling due to poor balance test results. Recommendation  for gentle exercise that does not aggravate pain much, take breaks and work in intervals to try to build endurance.    PATIENT EDUCATION: Education details: HEP, PT plan of care, selfcare Person educated: Patient Education method: Explanation, Demonstration, Verbal cues, and Handouts Education comprehension: verbalized understanding, further education recommended   HOME EXERCISE PROGRAM: Access Code: [email or web address] URL: [email or web address] Date: 12/16/2023 Prepared by: Redell Moose  Exercises - Seated Active Straight-Leg Raise  - 2 x daily - 6 x weekly - 2 sets - 5 reps - Seated Marching with Opposite Shoulder Flexion  - 2 x daily - 6 x weekly - 2 sets - 5 reps - Seated Scapular Retraction with External Rotation  - 2 x daily - 6 x weekly - 1 sets - 10 reps - 5 sec hold - Corner Balance Feet Together: Eyes Open With Head Turns  - 2 x daily - 6 x weekly - 1 sets - 10 reps - Romberg Stance with Head Nods  - 2 x daily - 6 x weekly - 1 sets - 10 reps  ASSESSMENT:  CLINICAL IMPRESSION: Patient referred to PT for abnormal gait and poor balance. She has generalized weakness, severe asthma,severe arthritis, deconditioning, and chronic widespread pain. Referring provider recommending 4 weeks or PT for 2-3 times per week. Patient will benefit from skilled PT to improve overall function and to address impairments and limitations listed below.  OBJECTIVE IMPAIRMENTS: decreased activity  tolerance for ADL's, difficulty walking, decreased balance, decreased endurance, decreased mobility, decreased ROM, decreased strength, impaired flexibility, impaired UE/LE use, and pain.  ACTIVITY LIMITATIONS: bending, liftting, walking, standing, cleaning, community activity, reaching, carry, occupation  PERSONAL FACTORS: see above PMH  also affecting patient's functional outcome.  REHAB POTENTIAL: Fair    CLINICAL DECISION MAKING: Evolving/moderate complexity  EVALUATION COMPLEXITY: Moderate    GOALS: Short term PT Goals Target date: 01/13/2024    Pt will be I and compliant with HEP. Baseline:  Goal status: New Pt will improve 30 second chair stand test to 8 reps or more Baseline:7 Goal status: New  Long term PT goals Target date:03/09/2024 Pt will improve  strength to at least 4+/5 MMT to improve functional strength Baseline: Goal status: New Pt will improve Patient specific functional scale (PSFS) to at least 7/10 to show improved function level Baseline: Goal status: New Pt will reduce pain to overall less than 5/10 with usual activity and work activity. Baseline: Goal status: New Pt will be able to improve TUG time to less than 20 seconds to show improved gait speed and balance Baseline:48 sec Goal status: New  PLAN: PT FREQUENCY: 2-3 times per week   PT DURATION: 4-12weeks  PLANNED INTERVENTIONS  [id number]-Therapeutic exercises, 97530- Therapeutic activity, [id number]- Neuromuscular re-education, 97535- Self Care, [id number]- Manual therapy, and [id number]- Gait training  PLAN FOR NEXT SESSION: strength, endurance, balance, gait as tolerated. Review HEP   Redell JONELLE Moose, PT,DPT 12/16/2023, 11:26 AM

## 2023-12-16 ENCOUNTER — Ambulatory Visit: Admitting: Physical Therapy

## 2023-12-16 ENCOUNTER — Ambulatory Visit: Attending: Family Medicine | Admitting: Physical Therapy

## 2023-12-16 ENCOUNTER — Encounter: Payer: Self-pay | Admitting: Physical Therapy

## 2023-12-16 DIAGNOSIS — R2689 Other abnormalities of gait and mobility: Secondary | ICD-10-CM | POA: Diagnosis not present

## 2023-12-16 DIAGNOSIS — M5459 Other low back pain: Secondary | ICD-10-CM | POA: Diagnosis not present

## 2023-12-16 DIAGNOSIS — M6281 Muscle weakness (generalized): Secondary | ICD-10-CM | POA: Insufficient documentation

## 2023-12-21 ENCOUNTER — Ambulatory Visit: Admitting: Physical Therapy

## 2023-12-22 ENCOUNTER — Encounter: Payer: Self-pay | Admitting: Physical Therapy

## 2023-12-22 ENCOUNTER — Ambulatory Visit: Admitting: Physical Therapy

## 2023-12-22 DIAGNOSIS — M6281 Muscle weakness (generalized): Secondary | ICD-10-CM

## 2023-12-22 DIAGNOSIS — R399 Unspecified symptoms and signs involving the genitourinary system: Secondary | ICD-10-CM | POA: Diagnosis not present

## 2023-12-22 DIAGNOSIS — R2689 Other abnormalities of gait and mobility: Secondary | ICD-10-CM | POA: Diagnosis not present

## 2023-12-22 DIAGNOSIS — M5459 Other low back pain: Secondary | ICD-10-CM

## 2023-12-22 NOTE — Therapy (Signed)
 OUTPATIENT PHYSICAL THERAPY TREATMENT   Patient Name: Glenda Terry MRN: [id number] DOB:07-Dec-1940, 83 y.o., female Today's Date: 12/22/2023  END OF SESSION:  PT End of Session - 12/22/23 1354     Visit Number 2    Number of Visits 12    Date for Recertification  03/09/24    PT Start Time 1345    PT Stop Time 1430    PT Time Calculation (min) 45 min    Activity Tolerance Patient limited by pain    Behavior During Therapy [facility] for tasks assessed/performed           Past Medical History:  Diagnosis Date   Anxiety    Asthma    Back pain, chronic    CKD (chronic kidney disease) stage 3, GFR 30-59 ml/min (HCC)    Compression fx, thoracic spine (HCC)    T - 11   COPD (chronic obstructive pulmonary disease) (HCC) 12/16/2015   Cushing's syndrome    Diverticulitis    Essential hypertension    GERD (gastroesophageal reflux disease)    HOH (hard of hearing)    Inflammatory arthritis    Iron  deficiency anemia 01/2012   Multilevel degenerative disc disease    Nausea with vomiting 01/14/2020   Pulmonary eosinophilia    Spinal stenosis    bilateral, lumbar   Tubular adenoma 11/2012   Wears dentures    full upper and lower   Wears hearing aid in both ears    Past Surgical History:  Procedure Laterality Date   ABDOMINAL HYSTERECTOMY     BACK SURGERY     BROW LIFT Bilateral 01/20/2023   Procedure: BLEPHAROPLASTY UPPER EYELID; W/EXCESS SKIN BLEPHAROPTOSIS REPAIR; RESECT EX BILATERAL;  Surgeon: Ashley Greig HERO, MD;  Location: [facility];  Service: Ophthalmology;  Laterality: Bilateral;   CARPAL TUNNEL RELEASE Right 06/2012   CATARACT EXTRACTION W/PHACO Left 11/19/2014   Procedure: CATARACT EXTRACTION PHACO AND INTRAOCULAR LENS PLACEMENT (IOC);  Surgeon: Dow JULIANNA Burke, MD;  Location: [facility];  Service: Ophthalmology;  Laterality: Left;  CDE: 4.77   CATARACT EXTRACTION W/PHACO Right 02/03/2015   Procedure: CATARACT EXTRACTION PHACO AND INTRAOCULAR LENS PLACEMENT  (IOC);  Surgeon: Dow JULIANNA Burke, MD;  Location: [facility];  Service: Ophthalmology;  Laterality: Right;  CDE: 4.54   COLONOSCOPY  06/19/2008   RMR: tortuous and elongated colon with scattered left-sided diverticula/colonic mucosa appeared entirely normal. Prior colonic ulcers had resolved.   COLONOSCOPY  12/2007   Dr. Shaaron: Scattered diffuse sigmoid diverticula, 2 areas of ulceration at the hepatic flexure. Biopsies unremarkable.   COLONOSCOPY N/A 11/20/2012   next TCS 11/2017   COLONOSCOPY WITH PROPOFOL  N/A 12/22/2017   Procedure: COLONOSCOPY WITH PROPOFOL ;  Surgeon: Shaaron Lamar HERO, MD; diverticula in the sigmoid and descending colon and 2 tubular adenomas.  No recommendations to repeat.     DECOMPRESSIVE LUMBAR LAMINECTOMY LEVEL 2  04/20/2012   Procedure: DECOMPRESSIVE LUMBAR LAMINECTOMY LEVEL 2;  Surgeon: Tanda DELENA Heading, MD;  Location: [facility];  Service: Orthopedics;  Laterality: Right;  Decompressive Lumbar Laminectomy of the L4 - L5 and L5 - S1 Complete/Laminectomy L5 on the Right (X-Ray)   ESOPHAGOGASTRODUODENOSCOPY  12/2007   Dr. Shaaron: Possible cervical esophageal whip, noncritical Schatzki ring status post dilation. Small hiatal hernia. Slightly pale duodenal mucosa (biopsy unremarkable)   ESOPHAGOGASTRODUODENOSCOPY (EGD) WITH PROPOFOL  N/A 01/20/2017   Dr. Shaaron: Medium sized hiatal hernia empiric esophageal dilation for history of dysphagia   ESOPHAGOGASTRODUODENOSCOPY (EGD) WITH PROPOFOL  N/A 04/08/2020   Surgeon:  Cindie Dunnings K, DO;  medium size hiatal hernia 3 to 4 cm chicken bone lodged in the gastric antrum just proximal to the pylorus removed with a snare with mild bleeding at the site of lodged bone.  Normal examined duodenum.   ESOPHAGOGASTRODUODENOSCOPY (EGD) WITH PROPOFOL  N/A 02/10/2022   Procedure: ESOPHAGOGASTRODUODENOSCOPY (EGD) WITH PROPOFOL ;  Surgeon: Shaaron Lamar HERO, MD;  Location: [facility];  Service: Endoscopy;  Laterality: N/A;  8:30 AM   EYE SURGERY     BIL  CATARACTS   FOREIGN BODY REMOVAL N/A 04/08/2020   Procedure: FOREIGN BODY REMOVAL;  Surgeon: Cindie Dunnings POUR, DO;  Location: [facility];  Service: Endoscopy;  Laterality: N/A;   INCISIONAL HERNIA REPAIR N/A 11/12/2020   Procedure: HERNIA REPAIR INCISIONAL;  Surgeon: Mavis Anes, MD;  Location: [facility];  Service: General;  Laterality: N/A;   IR KYPHO THORACIC WITH BONE BIOPSY  02/08/2017   IR RADIOLOGIST EVAL & MGMT  02/02/2017   JOINT REPLACEMENT     KNEE ARTHROSCOPY Right    MALONEY DILATION N/A 01/20/2017   Procedure: MALONEY DILATION;  Surgeon: Shaaron Lamar HERO, MD;  Location: [facility];  Service: Endoscopy;  Laterality: N/A;   MALONEY DILATION N/A 02/10/2022   Procedure: AGAPITO DILATION;  Surgeon: Shaaron Lamar HERO, MD;  Location: [facility];  Service: Endoscopy;  Laterality: N/A;   POLYPECTOMY  12/22/2017   Procedure: POLYPECTOMY;  Surgeon: Shaaron Lamar HERO, MD;  Location: [facility];  Service: Endoscopy;;   REVERSE SHOULDER ARTHROPLASTY Right 05/24/2014   Procedure: RIGHT SHOULDER REVERSE ARTHROPLASTY;  Surgeon: Marcey Her, MD;  Location: [facility];  Service: Orthopedics;  Laterality: Right;   REVERSE SHOULDER ARTHROPLASTY Left 06/10/2017   Procedure: LEFT REVERSE SHOULDER ARTHROPLASTY;  Surgeon: Her Marcey, MD;  Location: [facility];  Service: Orthopedics;  Laterality: Left;   TOTAL KNEE ARTHROPLASTY  01/24/2012   Procedure: TOTAL KNEE ARTHROPLASTY;  Surgeon: Dempsey LULLA Moan, MD;  Location: [facility];  Service: Orthopedics;  Laterality: Right;   TOTAL KNEE ARTHROPLASTY Left 10/15/2019   Procedure: TOTAL KNEE ARTHROPLASTY;  Surgeon: Moan Dempsey, MD;  Location: [facility];  Service: Orthopedics;  Laterality: Left;    TUBAL LIGATION     Patient Active Problem List   Diagnosis Date Noted   LLQ abdominal pain 07/01/2022   Edema of lower extremity 03/25/2022   Neuropathy 03/25/2022   Disorder of vitamin B12 03/25/2022   Nausea without vomiting 11/09/2021   Hypokalemia  01/07/2021   Hypermagnesemia 01/07/2021   Hiatal hernia 01/07/2021   Leukocytosis 01/07/2021   Mixed hyperlipidemia 01/07/2021   Chronic diastolic CHF (congestive heart failure) (HCC) 01/07/2021   Chronic pain syndrome 01/07/2021   Obesity (BMI 35.0-39.9 without comorbidity) 01/07/2021   Incisional hernia, without obstruction or gangrene    Fibromyalgia 10/08/2020   Chronic insomnia 10/08/2020   Chronic systolic heart failure (HCC) 10/08/2020   Morbid obesity (HCC) 10/08/2020   Generalized anxiety disorder 10/08/2020   Type 2 diabetes mellitus (HCC) 10/05/2020   Chronic bilateral low back pain 10/25/2019   History of total knee replacement, left 10/23/2019   Chronic constipation 10/22/2019   Bilateral lower extremity edema 10/22/2019   Chronic anemia 10/22/2019   Post-menopausal osteoporosis 10/22/2019   Localized edema 10/22/2019   Tachycardia 10/18/2019   Primary osteoarthritis of left knee 10/15/2019   Carpal tunnel syndrome of left wrist 08/23/2019   Degenerative scoliosis 11/27/2018   Other secondary scoliosis, thoracolumbar region 11/01/2018   History of total right knee replacement 02/16/2018  Hx of adenomatous colonic polyps 11/28/2017   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 10/04/2017   Other intervertebral disc degeneration, lumbar region 07/26/2017   S/P shoulder replacement, 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   Eosinophil count raised 06/09/2016   Severe persistent asthma (HCC) 06/07/2016   COPD (chronic obstructive pulmonary disease) (HCC) 12/16/2015   CKD (chronic kidney disease) stage 3, GFR 30-59 ml/min (HCC) 09/20/2015   Hemorrhoids, internal 08/21/2015   Microcytic anemia 11/01/2012   Spinal stenosis of lumbar region without neurogenic claudication 04/20/2012   Herniated lumbar intervertebral disc 04/20/2012   Difficulty walking 03/13/2012   Stiffness of joint, not elsewhere classified, lower leg 03/13/2012   Weakness of right leg 03/13/2012    Difficulty walking 03/13/2012   Anxiety 02/09/2011   HTN (hypertension) 02/09/2011   GERD (gastroesophageal reflux disease) 02/09/2011   Rheumatoid arthritis (HCC) 02/09/2011    PCP: Shona Norleen PEDLAR, MD   REFERRING PROVIDER: Faye Lauraine PARAS, FNP   REFERRING DIAG: R26.89 (ICD-10-CM) - Other abnormalities of gait and mobility   Rationale for Evaluation and Treatment:  Rehabiliation  THERAPY DIAG:  Other abnormalities of gait and mobility  Muscle weakness (generalized)  Other low back pain  ONSET DATE: Chronic condition, worse since fall May 2025   SUBJECTIVE:                                                                                                                                                                                           SUBJECTIVE STATEMENT: Relays pain is better after doing exercises.   PERTINENT HISTORY:  See above PMH  PAIN:  NPRS scale:2/10 upon arrival Pain location:back and all over, shoulders, elbows, knees, hands, feet,  Pain description: constant, achy, sharp Aggravating factors: just there all the time Relieving factors: rest, laying down on back   PRECAUTIONS: ,  Fall  RED FLAGS: None   WEIGHT BEARING RESTRICTIONS:  No  FALLS:  Has patient fallen in last 6 months? Yes. Number of falls 1, bad fall in May  PLOF:  Needs assistance with ADLs, she has aide come help her with housework 4 hours a day  PATIENT GOALS:  Help with some of the pain and walk better  OBJECTIVE:  Note: Objective measures were completed at Evaluation unless otherwise noted.  DIAGNOSTIC FINDINGS:  Throacic XR IMPRESSION: 1. No definite acute fracture in the thoracic spine. 2. Chronic compression fractures of T10, T11, and T12. Lumabar XR IMPRESSION: 1. No acute fracture identified. 2. Stable mild compression deformities of T12, L1 and L4. 3. 5 mm of anterolisthesis at L5-S1 has slightly progressed.  PATIENT SURVEYS:  Patient-Specific Activity  Scoring  Scheme  0 represents "unable to perform." 10 represents "able to perform at prior level. 0 1 2 3 4 5 6 7 8 9  10 (Date and Score)   Activity Eval     1. waling 6    2. Endurance 4    3.     4.    5.    Score 5/10    Total score = sum of the activity scores/number of activities Minimum detectable change (90%CI) for average score = 2 points Minimum detectable change (90%CI) for single activity score = 3 points  POSTURE:  increased thoracic kyphosis and flexed trunk   GAIT: Assistive device utilized: Single point cane Level of assistance: SBA Comments: very unsteady and can only ambulate short distances due to fatigue and Shortness or breath.     UPPER EXTREMITY MMT:  MMT Right eval Left eval  Shoulder flexion 4 4  Shoulder extension    Shoulder abduction 4 4  Shoulder adduction    Shoulder extension    Shoulder internal rotation 4 4  Shoulder external rotation 4 4  Middle trapezius    Lower trapezius    Elbow flexion 4 4  Elbow extension 4 4  Wrist flexion    Wrist extension    Wrist ulnar deviation    Wrist radial deviation    Wrist pronation    Wrist supination    Grip strength     (Blank rows = not tested)   LOWER EXTREMITY MMT:    MMT Right eval Left eval  Hip flexion 3 3  Hip extension    Hip abduction 3 3  Hip adduction    Hip internal rotation    Hip external rotation    Knee flexion 3+ 4  Knee extension 3+ 4  Ankle dorsiflexion 3 4  Ankle plantarflexion    Ankle inversion    Ankle eversion     (Blank rows = not tested)   FUNCTIONAL TESTS:  12/16/23 30 seconds chair stand test: 7 reps without UE support Timed up and go (TUG): 48 seconds without AD, but needed Min A, 2 different times due to loss of balance Balance with feet together eyes open: 20 seconds Balance with feet together eyes closed: 2 seconds                                                                                                                               TREATMENT DATE:  12/22/23 Therex: Nu step L1 X 9 minutes work time, did intervals of 1 min work, 30 sec rest X 9 rounds (monitored Sp02 levels which stayed above 90% until the 9th interval was completed and dropped down to 88% so discontinued and used inhaler to bring up levels before continuing her exercsies) Seated SLR X 10 reps bilat Seated scapular retractions X 10  Neuromuscular Re-ed Balance with feet together and eyes closed 10 sec X 5 Balance with feet together and head turns X  10 bilat Sidestepping in bars without UE support 10 feet to left and 10 feet to right, X 3 reps each Sit to stand Power, without UE support to push up then moving into bilat shoulder flexion X 5 reps Seated march with opposite shoulder flexion attempted to do this reciprocal pattern but too difficult for her to coordinate so did right arm with left leg X 10 reps then left arm and right leg X 10 reps       PATIENT EDUCATION: Education details: HEP, PT plan of care, selfcare Person educated: Patient Education method: Explanation, Demonstration, Verbal cues, and Handouts Education comprehension: verbalized understanding, further education recommended   HOME EXERCISE PROGRAM: Access Code: [email or web address] URL: [email or web address] Date: 12/16/2023 Prepared by: Redell Moose  Exercises - Seated Active Straight-Leg Raise  - 2 x daily - 6 x weekly - 2 sets - 5 reps - Seated Marching with Opposite Shoulder Flexion  - 2 x daily - 6 x weekly - 2 sets - 5 reps - Seated Scapular Retraction with External Rotation  - 2 x daily - 6 x weekly - 1 sets - 10 reps - 5 sec hold - Corner Balance Feet Together: Eyes Open With Head Turns  - 2 x daily - 6 x weekly - 1 sets - 10 reps - Romberg Stance with Head Nods  - 2 x daily - 6 x weekly - 1 sets - 10 reps  ASSESSMENT:  CLINICAL IMPRESSION: Today focused on HEP review and education and I did have to modify one of her exercises as she lacks coordination to  do this one so I made it easier for her to coordinate (this was the seated marching with opposite shoulder flexion exercise). We also worked on balance and endurance today with close monitoring of SPO2 levels today and she did need to use her inhaler during and after session.    Per eval: Patient referred to PT for abnormal gait and poor balance. She has generalized weakness, severe asthma,severe arthritis, deconditioning, and chronic widespread pain. Referring provider recommending 4 weeks or PT for 2-3 times per week. Patient will benefit from skilled PT to improve overall function and to address impairments and limitations listed below.  OBJECTIVE IMPAIRMENTS: decreased activity tolerance for ADL's, difficulty walking, decreased balance, decreased endurance, decreased mobility, decreased ROM, decreased strength, impaired flexibility, impaired UE/LE use, and pain.  ACTIVITY LIMITATIONS: bending, liftting, walking, standing, cleaning, community activity, reaching, carry, occupation  PERSONAL FACTORS: see above PMH  also affecting patient's functional outcome.  REHAB POTENTIAL: Fair    CLINICAL DECISION MAKING: Evolving/moderate complexity  EVALUATION COMPLEXITY: Moderate    GOALS: Short term PT Goals Target date: 01/13/2024    Pt will be I and compliant with HEP. Baseline:  Goal status: New Pt will improve 30 second chair stand test to 8 reps or more Baseline:7 Goal status: New  Long term PT goals Target date:03/09/2024 Pt will improve  strength to at least 4+/5 MMT to improve functional strength Baseline: Goal status: New Pt will improve Patient specific functional scale (PSFS) to at least 7/10 to show improved function level Baseline: Goal status: New Pt will reduce pain to overall less than 5/10 with usual activity and work activity. Baseline: Goal status: New Pt will be able to improve TUG time to less than 20 seconds to show improved gait speed and balance Baseline:48  sec Goal status: New  PLAN: PT FREQUENCY: 2-3 times per week   PT DURATION: 4-12weeks  PLANNED INTERVENTIONS  [id number]-Therapeutic exercises,  [id number]- Therapeutic activity, [id number]- Neuromuscular re-education, [id number]- Self Care, [id number]- Manual therapy, and [id number]- Gait training  PLAN FOR NEXT SESSION: closely monitor Spo2 levels, work on strength, endurance, balance, gait as tolerated.    Redell JONELLE Moose, PT,DPT 12/22/2023, 1:56 PM

## 2023-12-26 ENCOUNTER — Ambulatory Visit: Admitting: Physical Therapy

## 2023-12-27 DIAGNOSIS — I5032 Chronic diastolic (congestive) heart failure: Secondary | ICD-10-CM | POA: Diagnosis not present

## 2023-12-27 DIAGNOSIS — E1122 Type 2 diabetes mellitus with diabetic chronic kidney disease: Secondary | ICD-10-CM | POA: Diagnosis not present

## 2023-12-27 DIAGNOSIS — F411 Generalized anxiety disorder: Secondary | ICD-10-CM | POA: Diagnosis not present

## 2023-12-27 DIAGNOSIS — I10 Essential (primary) hypertension: Secondary | ICD-10-CM | POA: Diagnosis not present

## 2023-12-27 DIAGNOSIS — J449 Chronic obstructive pulmonary disease, unspecified: Secondary | ICD-10-CM | POA: Diagnosis not present

## 2023-12-27 DIAGNOSIS — M545 Low back pain, unspecified: Secondary | ICD-10-CM | POA: Diagnosis not present

## 2023-12-27 DIAGNOSIS — J4541 Moderate persistent asthma with (acute) exacerbation: Secondary | ICD-10-CM | POA: Diagnosis not present

## 2023-12-27 DIAGNOSIS — N1832 Chronic kidney disease, stage 3b: Secondary | ICD-10-CM | POA: Diagnosis not present

## 2023-12-27 DIAGNOSIS — D509 Iron deficiency anemia, unspecified: Secondary | ICD-10-CM | POA: Diagnosis not present

## 2023-12-27 DIAGNOSIS — E782 Mixed hyperlipidemia: Secondary | ICD-10-CM | POA: Diagnosis not present

## 2023-12-27 DIAGNOSIS — K5909 Other constipation: Secondary | ICD-10-CM | POA: Diagnosis not present

## 2023-12-27 DIAGNOSIS — J302 Other seasonal allergic rhinitis: Secondary | ICD-10-CM | POA: Diagnosis not present

## 2023-12-28 ENCOUNTER — Encounter: Admitting: Physical Therapy

## 2023-12-29 ENCOUNTER — Encounter: Payer: Self-pay | Admitting: Physical Therapy

## 2023-12-29 ENCOUNTER — Ambulatory Visit: Admitting: Physical Therapy

## 2023-12-29 ENCOUNTER — Other Ambulatory Visit: Payer: Self-pay | Admitting: Nurse Practitioner

## 2023-12-29 DIAGNOSIS — R1084 Generalized abdominal pain: Secondary | ICD-10-CM

## 2023-12-29 NOTE — Therapy (Signed)
 No-show for PT visit 12/28/20. Reached out to her via mychart message regarding missed appointment as well as next scheduled visit.   Josette Rough, PT, DPT 12/29/23 12:03 PM

## 2023-12-30 ENCOUNTER — Ambulatory Visit (HOSPITAL_COMMUNITY)
Admission: RE | Admit: 2023-12-30 | Discharge: 2023-12-30 | Disposition: A | Discharge status: Home / Self Care | Source: Ambulatory Visit | Attending: Nurse Practitioner | Admitting: Nurse Practitioner

## 2023-12-30 DIAGNOSIS — K449 Diaphragmatic hernia without obstruction or gangrene: Secondary | ICD-10-CM | POA: Diagnosis not present

## 2023-12-30 DIAGNOSIS — R112 Nausea with vomiting, unspecified: Secondary | ICD-10-CM | POA: Diagnosis not present

## 2023-12-30 DIAGNOSIS — Z9049 Acquired absence of other specified parts of digestive tract: Secondary | ICD-10-CM | POA: Diagnosis not present

## 2023-12-30 DIAGNOSIS — R1084 Generalized abdominal pain: Secondary | ICD-10-CM | POA: Diagnosis not present

## 2023-12-30 DIAGNOSIS — K5730 Diverticulosis of large intestine without perforation or abscess without bleeding: Secondary | ICD-10-CM | POA: Diagnosis not present

## 2023-12-30 LAB — POCT I-STAT CREATININE: Creatinine, Ser: 1.6 mg/dL — ABNORMAL HIGH (ref 0.44–1.00)

## 2023-12-30 MED ORDER — SODIUM CHLORIDE (PF) 0.9 % IJ SOLN
INTRAMUSCULAR | Status: AC
  Filled 2023-12-30: qty 50

## 2023-12-30 MED ORDER — IOHEXOL 9 MG/ML PO SOLN
500.0000 mL | ORAL | Status: AC
  Administered 2023-12-30 (×2): 500 mL via ORAL

## 2023-12-30 MED ADMIN — Iohexol Inj 300 MG/ML: 75 mL | INTRAVENOUS | NDC 00407141363

## 2024-01-02 ENCOUNTER — Encounter: Admitting: Physical Therapy

## 2024-01-04 ENCOUNTER — Ambulatory Visit: Admitting: Physical Therapy

## 2024-01-04 ENCOUNTER — Encounter: Admitting: Physical Therapy

## 2024-01-09 ENCOUNTER — Encounter: Admitting: Physical Therapy

## 2024-01-10 ENCOUNTER — Ambulatory Visit: Admitting: Physical Therapy

## 2024-01-13 DIAGNOSIS — E1122 Type 2 diabetes mellitus with diabetic chronic kidney disease: Secondary | ICD-10-CM | POA: Diagnosis not present

## 2024-01-13 DIAGNOSIS — E782 Mixed hyperlipidemia: Secondary | ICD-10-CM | POA: Diagnosis not present

## 2024-01-13 DIAGNOSIS — I10 Essential (primary) hypertension: Secondary | ICD-10-CM | POA: Diagnosis not present

## 2024-01-13 DIAGNOSIS — J449 Chronic obstructive pulmonary disease, unspecified: Secondary | ICD-10-CM | POA: Diagnosis not present

## 2024-01-17 ENCOUNTER — Encounter: Admitting: Physical Therapy

## 2024-01-20 DIAGNOSIS — J4551 Severe persistent asthma with (acute) exacerbation: Secondary | ICD-10-CM | POA: Diagnosis not present

## 2024-01-20 DIAGNOSIS — J4550 Severe persistent asthma, uncomplicated: Secondary | ICD-10-CM | POA: Diagnosis not present

## 2024-01-20 DIAGNOSIS — Z23 Encounter for immunization: Secondary | ICD-10-CM | POA: Diagnosis not present

## 2024-01-20 DIAGNOSIS — J8283 Eosinophilic asthma: Secondary | ICD-10-CM | POA: Diagnosis not present

## 2024-01-20 DIAGNOSIS — D7219 Other eosinophilia: Secondary | ICD-10-CM | POA: Diagnosis not present

## 2024-01-20 DIAGNOSIS — F419 Anxiety disorder, unspecified: Secondary | ICD-10-CM | POA: Diagnosis not present

## 2024-01-20 DIAGNOSIS — J301 Allergic rhinitis due to pollen: Secondary | ICD-10-CM | POA: Diagnosis not present

## 2024-01-24 ENCOUNTER — Encounter: Admitting: Physical Therapy

## 2024-01-25 DIAGNOSIS — M545 Low back pain, unspecified: Secondary | ICD-10-CM | POA: Diagnosis not present

## 2024-01-25 DIAGNOSIS — J4541 Moderate persistent asthma with (acute) exacerbation: Secondary | ICD-10-CM | POA: Diagnosis not present

## 2024-01-25 DIAGNOSIS — I5032 Chronic diastolic (congestive) heart failure: Secondary | ICD-10-CM | POA: Diagnosis not present

## 2024-01-25 DIAGNOSIS — J302 Other seasonal allergic rhinitis: Secondary | ICD-10-CM | POA: Diagnosis not present

## 2024-01-25 DIAGNOSIS — J449 Chronic obstructive pulmonary disease, unspecified: Secondary | ICD-10-CM | POA: Diagnosis not present

## 2024-01-25 DIAGNOSIS — I10 Essential (primary) hypertension: Secondary | ICD-10-CM | POA: Diagnosis not present

## 2024-01-25 DIAGNOSIS — F411 Generalized anxiety disorder: Secondary | ICD-10-CM | POA: Diagnosis not present

## 2024-01-25 DIAGNOSIS — E782 Mixed hyperlipidemia: Secondary | ICD-10-CM | POA: Diagnosis not present

## 2024-01-25 DIAGNOSIS — K5909 Other constipation: Secondary | ICD-10-CM | POA: Diagnosis not present

## 2024-01-25 DIAGNOSIS — N1832 Chronic kidney disease, stage 3b: Secondary | ICD-10-CM | POA: Diagnosis not present

## 2024-01-25 DIAGNOSIS — D509 Iron deficiency anemia, unspecified: Secondary | ICD-10-CM | POA: Diagnosis not present

## 2024-01-25 DIAGNOSIS — E1122 Type 2 diabetes mellitus with diabetic chronic kidney disease: Secondary | ICD-10-CM | POA: Diagnosis not present

## 2024-01-27 ENCOUNTER — Other Ambulatory Visit (HOSPITAL_COMMUNITY): Payer: Self-pay | Admitting: Internal Medicine

## 2024-01-27 DIAGNOSIS — M25571 Pain in right ankle and joints of right foot: Secondary | ICD-10-CM | POA: Diagnosis not present

## 2024-01-27 DIAGNOSIS — M25572 Pain in left ankle and joints of left foot: Secondary | ICD-10-CM | POA: Diagnosis not present

## 2024-01-30 ENCOUNTER — Telehealth: Payer: Self-pay

## 2024-01-30 NOTE — Telephone Encounter (Signed)
 Auth Submission: NO AUTH NEEDED Site of care: Site of care: [facility] Payer: health team advtg ppo Medication & CPT/J Code(s) submitted: Prolia  (Denosumab ) [id number] Diagnosis Code:  Route of submission (phone, fax, portal): phone Phone # Fax # Auth type: Buy/Bill PB Units/visits requested: 60mg  q68months x 2doses Reference number: [id number] Approval from: 01/30/24 to 03/14/24

## 2024-02-12 DIAGNOSIS — E782 Mixed hyperlipidemia: Secondary | ICD-10-CM | POA: Diagnosis not present

## 2024-02-12 DIAGNOSIS — E1122 Type 2 diabetes mellitus with diabetic chronic kidney disease: Secondary | ICD-10-CM | POA: Diagnosis not present

## 2024-02-12 DIAGNOSIS — I5022 Chronic systolic (congestive) heart failure: Secondary | ICD-10-CM | POA: Diagnosis not present

## 2024-02-17 DIAGNOSIS — D509 Iron deficiency anemia, unspecified: Secondary | ICD-10-CM | POA: Diagnosis not present

## 2024-02-17 DIAGNOSIS — E538 Deficiency of other specified B group vitamins: Secondary | ICD-10-CM | POA: Diagnosis not present

## 2024-02-17 DIAGNOSIS — E1122 Type 2 diabetes mellitus with diabetic chronic kidney disease: Secondary | ICD-10-CM | POA: Diagnosis not present

## 2024-02-24 ENCOUNTER — Encounter: Payer: Self-pay | Admitting: Family Medicine

## 2024-02-27 ENCOUNTER — Other Ambulatory Visit (HOSPITAL_COMMUNITY): Payer: Self-pay | Admitting: Internal Medicine

## 2024-02-27 DIAGNOSIS — M810 Age-related osteoporosis without current pathological fracture: Secondary | ICD-10-CM

## 2024-03-12 ENCOUNTER — Ambulatory Visit

## 2024-03-14 ENCOUNTER — Ambulatory Visit

## 2024-03-23 ENCOUNTER — Ambulatory Visit (HOSPITAL_COMMUNITY)
Admission: RE | Admit: 2024-03-23 | Discharge: 2024-03-23 | Disposition: A | Source: Ambulatory Visit | Attending: Internal Medicine | Admitting: Internal Medicine

## 2024-03-23 DIAGNOSIS — M810 Age-related osteoporosis without current pathological fracture: Secondary | ICD-10-CM | POA: Insufficient documentation

## 2024-03-24 ENCOUNTER — Ambulatory Visit: Payer: Self-pay | Admitting: Nurse Practitioner

## 2024-04-13 ENCOUNTER — Ambulatory Visit

## 2024-05-04 ENCOUNTER — Ambulatory Visit: Admitting: Nurse Practitioner

## 2024-05-04 ENCOUNTER — Ambulatory Visit

## 2024-05-11 ENCOUNTER — Ambulatory Visit: Admitting: Internal Medicine
